# Patient Record
Sex: Female | Born: 1935 | Race: White | Hispanic: Yes | Smoking: Never smoker
Health system: Southern US, Community
[De-identification: ages and names within clinical notes are randomized; demographics above are authoritative.]

## PROBLEM LIST (undated history)

## (undated) DIAGNOSIS — K5730 Diverticulosis of large intestine without perforation or abscess without bleeding: Secondary | ICD-10-CM

## (undated) DIAGNOSIS — N390 Urinary tract infection, site not specified: Secondary | ICD-10-CM

## (undated) DIAGNOSIS — K432 Incisional hernia without obstruction or gangrene: Secondary | ICD-10-CM

## (undated) DIAGNOSIS — R531 Weakness: Secondary | ICD-10-CM

## (undated) DIAGNOSIS — R350 Frequency of micturition: Secondary | ICD-10-CM

## (undated) DIAGNOSIS — IMO0001 Reserved for inherently not codable concepts without codable children: Secondary | ICD-10-CM

## (undated) DIAGNOSIS — I2584 Coronary atherosclerosis due to calcified coronary lesion: Secondary | ICD-10-CM

## (undated) DIAGNOSIS — F03B0 Unspecified dementia, moderate, without behavioral disturbance, psychotic disturbance, mood disturbance, and anxiety: Secondary | ICD-10-CM

## (undated) DIAGNOSIS — N393 Stress incontinence (female) (male): Secondary | ICD-10-CM

## (undated) DIAGNOSIS — K228 Other specified diseases of esophagus: Secondary | ICD-10-CM

## (undated) DIAGNOSIS — H409 Unspecified glaucoma: Secondary | ICD-10-CM

## (undated) DIAGNOSIS — R51 Headache: Secondary | ICD-10-CM

## (undated) DIAGNOSIS — T18128A Food in esophagus causing other injury, initial encounter: Secondary | ICD-10-CM

## (undated) DIAGNOSIS — K5900 Constipation, unspecified: Secondary | ICD-10-CM

## (undated) DIAGNOSIS — I5032 Chronic diastolic (congestive) heart failure: Secondary | ICD-10-CM

## (undated) DIAGNOSIS — Z7382 Dual sensory impairment: Secondary | ICD-10-CM

## (undated) DIAGNOSIS — N3010 Interstitial cystitis (chronic) without hematuria: Secondary | ICD-10-CM

## (undated) DIAGNOSIS — R55 Syncope and collapse: Secondary | ICD-10-CM

## (undated) DIAGNOSIS — M48061 Spinal stenosis, lumbar region without neurogenic claudication: Secondary | ICD-10-CM

## (undated) DIAGNOSIS — E878 Other disorders of electrolyte and fluid balance, not elsewhere classified: Secondary | ICD-10-CM

## (undated) DIAGNOSIS — G629 Polyneuropathy, unspecified: Secondary | ICD-10-CM

## (undated) DIAGNOSIS — W06XXXA Fall from bed, initial encounter: Secondary | ICD-10-CM

## (undated) DIAGNOSIS — K635 Polyp of colon: Secondary | ICD-10-CM

## (undated) DIAGNOSIS — F329 Major depressive disorder, single episode, unspecified: Secondary | ICD-10-CM

## (undated) DIAGNOSIS — M542 Cervicalgia: Secondary | ICD-10-CM

## (undated) DIAGNOSIS — I10 Essential (primary) hypertension: Secondary | ICD-10-CM

## (undated) DIAGNOSIS — G4700 Insomnia, unspecified: Secondary | ICD-10-CM

## (undated) DIAGNOSIS — H547 Unspecified visual loss: Secondary | ICD-10-CM

## (undated) DIAGNOSIS — G5600 Carpal tunnel syndrome, unspecified upper limb: Secondary | ICD-10-CM

## (undated) DIAGNOSIS — N183 Chronic kidney disease, stage 3 (moderate): Secondary | ICD-10-CM

## (undated) DIAGNOSIS — N179 Acute kidney failure, unspecified: Secondary | ICD-10-CM

## (undated) DIAGNOSIS — Z860100 Personal history of colon polyps, unspecified: Secondary | ICD-10-CM

## (undated) DIAGNOSIS — G609 Hereditary and idiopathic neuropathy, unspecified: Secondary | ICD-10-CM

## (undated) DIAGNOSIS — W44F3XA Food entering into or through a natural orifice, initial encounter: Secondary | ICD-10-CM

## (undated) DIAGNOSIS — S92351D Displaced fracture of fifth metatarsal bone, right foot, subsequent encounter for fracture with routine healing: Secondary | ICD-10-CM

## (undated) DIAGNOSIS — M12819 Other specific arthropathies, not elsewhere classified, unspecified shoulder: Secondary | ICD-10-CM

## (undated) DIAGNOSIS — M75101 Unspecified rotator cuff tear or rupture of right shoulder, not specified as traumatic: Secondary | ICD-10-CM

## (undated) DIAGNOSIS — E039 Hypothyroidism, unspecified: Secondary | ICD-10-CM

## (undated) DIAGNOSIS — H353190 Nonexudative age-related macular degeneration, unspecified eye, stage unspecified: Secondary | ICD-10-CM

## (undated) DIAGNOSIS — R634 Abnormal weight loss: Secondary | ICD-10-CM

## (undated) DIAGNOSIS — I442 Atrioventricular block, complete: Secondary | ICD-10-CM

## (undated) DIAGNOSIS — R519 Headache, unspecified: Secondary | ICD-10-CM

## (undated) DIAGNOSIS — I951 Orthostatic hypotension: Secondary | ICD-10-CM

## (undated) DIAGNOSIS — F0280 Dementia in other diseases classified elsewhere without behavioral disturbance: Secondary | ICD-10-CM

## (undated) DIAGNOSIS — G309 Alzheimer's disease, unspecified: Secondary | ICD-10-CM

## (undated) DIAGNOSIS — I700 Atherosclerosis of aorta: Secondary | ICD-10-CM

## (undated) DIAGNOSIS — K222 Esophageal obstruction: Secondary | ICD-10-CM

## (undated) DIAGNOSIS — M546 Pain in thoracic spine: Principal | ICD-10-CM

## (undated) DIAGNOSIS — H9190 Unspecified hearing loss, unspecified ear: Secondary | ICD-10-CM

## (undated) DIAGNOSIS — R0602 Shortness of breath: Secondary | ICD-10-CM

## (undated) DIAGNOSIS — M25552 Pain in left hip: Secondary | ICD-10-CM

## (undated) DIAGNOSIS — E785 Hyperlipidemia, unspecified: Secondary | ICD-10-CM

## (undated) DIAGNOSIS — R222 Localized swelling, mass and lump, trunk: Secondary | ICD-10-CM

## (undated) DIAGNOSIS — G8929 Other chronic pain: Secondary | ICD-10-CM

## (undated) DIAGNOSIS — M810 Age-related osteoporosis without current pathological fracture: Secondary | ICD-10-CM

## (undated) DIAGNOSIS — E871 Hypo-osmolality and hyponatremia: Secondary | ICD-10-CM

## (undated) DIAGNOSIS — I441 Atrioventricular block, second degree: Secondary | ICD-10-CM

## (undated) DIAGNOSIS — H9110 Presbycusis, unspecified ear: Secondary | ICD-10-CM

## (undated) DIAGNOSIS — Z950 Presence of cardiac pacemaker: Secondary | ICD-10-CM

## (undated) DIAGNOSIS — J479 Bronchiectasis, uncomplicated: Secondary | ICD-10-CM

## (undated) DIAGNOSIS — R2681 Unsteadiness on feet: Secondary | ICD-10-CM

## (undated) DIAGNOSIS — G894 Chronic pain syndrome: Secondary | ICD-10-CM

## (undated) DIAGNOSIS — R5383 Other fatigue: Secondary | ICD-10-CM

## (undated) DIAGNOSIS — K859 Acute pancreatitis without necrosis or infection, unspecified: Secondary | ICD-10-CM

## (undated) DIAGNOSIS — M109 Gout, unspecified: Secondary | ICD-10-CM

## (undated) DIAGNOSIS — M1990 Unspecified osteoarthritis, unspecified site: Secondary | ICD-10-CM

## (undated) DIAGNOSIS — M549 Dorsalgia, unspecified: Secondary | ICD-10-CM

## (undated) DIAGNOSIS — N3946 Mixed incontinence: Secondary | ICD-10-CM

## (undated) DIAGNOSIS — J101 Influenza due to other identified influenza virus with other respiratory manifestations: Secondary | ICD-10-CM

## (undated) DIAGNOSIS — F0390 Unspecified dementia without behavioral disturbance: Secondary | ICD-10-CM

## (undated) DIAGNOSIS — Z45018 Encounter for adjustment and management of other part of cardiac pacemaker: Secondary | ICD-10-CM

## (undated) DIAGNOSIS — I639 Cerebral infarction, unspecified: Secondary | ICD-10-CM

## (undated) DIAGNOSIS — H9193 Unspecified hearing loss, bilateral: Secondary | ICD-10-CM

## (undated) DIAGNOSIS — M159 Polyosteoarthritis, unspecified: Secondary | ICD-10-CM

## (undated) DIAGNOSIS — M25561 Pain in right knee: Secondary | ICD-10-CM

## (undated) DIAGNOSIS — R29898 Other symptoms and signs involving the musculoskeletal system: Secondary | ICD-10-CM

## (undated) DIAGNOSIS — R0781 Pleurodynia: Secondary | ICD-10-CM

## (undated) DIAGNOSIS — K5909 Other constipation: Secondary | ICD-10-CM

## (undated) DIAGNOSIS — Z8739 Personal history of other diseases of the musculoskeletal system and connective tissue: Secondary | ICD-10-CM

## (undated) DIAGNOSIS — Z96619 Presence of unspecified artificial shoulder joint: Secondary | ICD-10-CM

## (undated) DIAGNOSIS — I2510 Atherosclerotic heart disease of native coronary artery without angina pectoris: Secondary | ICD-10-CM

## (undated) DIAGNOSIS — R42 Dizziness and giddiness: Secondary | ICD-10-CM

## (undated) DIAGNOSIS — R1310 Dysphagia, unspecified: Secondary | ICD-10-CM

## (undated) DIAGNOSIS — I498 Other specified cardiac arrhythmias: Secondary | ICD-10-CM

## (undated) DIAGNOSIS — I509 Heart failure, unspecified: Secondary | ICD-10-CM

## (undated) DIAGNOSIS — Z7409 Other reduced mobility: Secondary | ICD-10-CM

## (undated) DIAGNOSIS — M353 Polymyalgia rheumatica: Secondary | ICD-10-CM

## (undated) DIAGNOSIS — M75100 Unspecified rotator cuff tear or rupture of unspecified shoulder, not specified as traumatic: Secondary | ICD-10-CM

## (undated) DIAGNOSIS — N819 Female genital prolapse, unspecified: Secondary | ICD-10-CM

## (undated) DIAGNOSIS — H04123 Dry eye syndrome of bilateral lacrimal glands: Secondary | ICD-10-CM

## (undated) HISTORY — DX: Urinary tract infection, site not specified: N39.0

## (undated) HISTORY — PX: EYE SURGERY: SHX253

## (undated) HISTORY — DX: Carpal tunnel syndrome, unspecified upper limb: G56.00

## (undated) HISTORY — PX: INSERT / REPLACE / REMOVE PACEMAKER: SUR710

## (undated) HISTORY — DX: Fall from bed, initial encounter: W06.XXXA

## (undated) HISTORY — DX: Acute kidney failure, unspecified: N17.9

## (undated) HISTORY — DX: Other symptoms and signs involving the musculoskeletal system: R29.898

## (undated) HISTORY — DX: Polyneuropathy, unspecified: G62.9

## (undated) HISTORY — DX: Chronic pain syndrome: G89.4

## (undated) HISTORY — DX: Other fatigue: R53.83

## (undated) HISTORY — DX: Frequency of micturition: R35.0

## (undated) HISTORY — DX: Atherosclerotic heart disease of native coronary artery without angina pectoris: I25.10

## (undated) HISTORY — DX: Spinal stenosis, lumbar region without neurogenic claudication: M48.061

## (undated) HISTORY — PX: CARPAL TUNNEL RELEASE: SHX101

## (undated) HISTORY — DX: Bronchiectasis, uncomplicated: J47.9

## (undated) HISTORY — DX: Diverticulosis of large intestine without perforation or abscess without bleeding: K57.30

## (undated) HISTORY — DX: Chronic kidney disease, stage 3 (moderate): N18.3

## (undated) HISTORY — DX: Pain in right knee: M25.561

## (undated) HISTORY — DX: Cervicalgia: M54.2

## (undated) HISTORY — DX: Headache: R51

## (undated) HISTORY — DX: Displaced fracture of fifth metatarsal bone, right foot, subsequent encounter for fracture with routine healing: S92.351D

## (undated) HISTORY — DX: Atherosclerosis of aorta: I70.0

## (undated) HISTORY — PX: LOOP RECORDER IMPLANT: SHX5954

## (undated) HISTORY — DX: Headache, unspecified: R51.9

## (undated) HISTORY — DX: Atrioventricular block, complete: I44.2

## (undated) HISTORY — DX: Presbycusis, unspecified ear: H91.10

## (undated) HISTORY — DX: Gout, unspecified: M10.9

## (undated) HISTORY — DX: Influenza due to other identified influenza virus with other respiratory manifestations: J10.1

## (undated) HISTORY — DX: Hypo-osmolality and hyponatremia: E87.1

## (undated) HISTORY — DX: Major depressive disorder, single episode, unspecified: F32.9

## (undated) HISTORY — DX: Other specific arthropathies, not elsewhere classified, unspecified shoulder: M12.819

## (undated) HISTORY — DX: Unspecified hearing loss, unspecified ear: H91.90

## (undated) HISTORY — DX: Encounter for adjustment and management of other part of cardiac pacemaker: Z45.018

## (undated) HISTORY — DX: Unspecified rotator cuff tear or rupture of unspecified shoulder, not specified as traumatic: M75.100

## (undated) HISTORY — DX: Syncope and collapse: R55

## (undated) HISTORY — DX: Coronary atherosclerosis due to calcified coronary lesion: I25.84

## (undated) HISTORY — DX: Pleurodynia: R07.81

## (undated) HISTORY — DX: Weakness: R53.1

## (undated) HISTORY — DX: Interstitial cystitis (chronic) without hematuria: N30.10

## (undated) HISTORY — DX: Unspecified glaucoma: H40.9

## (undated) HISTORY — DX: Dementia in other diseases classified elsewhere without behavioral disturbance: F02.80

## (undated) HISTORY — DX: Pain in thoracic spine: M54.6

## (undated) HISTORY — DX: Essential (primary) hypertension: I10

## (undated) HISTORY — DX: Hereditary and idiopathic neuropathy, unspecified: G60.9

## (undated) HISTORY — DX: Other chronic pain: G89.29

## (undated) HISTORY — DX: Unspecified osteoarthritis, unspecified site: M19.90

## (undated) HISTORY — DX: Personal history of other diseases of the musculoskeletal system and connective tissue: Z87.39

## (undated) HISTORY — DX: Dual sensory impairment: Z73.82

## (undated) HISTORY — DX: Polymyalgia rheumatica: M35.3

## (undated) HISTORY — DX: Pain in left hip: M25.552

## (undated) HISTORY — DX: Dizziness and giddiness: R42

## (undated) HISTORY — DX: Nonexudative age-related macular degeneration, unspecified eye, stage unspecified: H35.3190

## (undated) HISTORY — DX: Hyperlipidemia, unspecified: E78.5

## (undated) HISTORY — DX: Dysphagia, unspecified: R13.10

## (undated) HISTORY — DX: Orthostatic hypotension: I95.1

## (undated) HISTORY — DX: Other reduced mobility: Z74.09

## (undated) HISTORY — DX: Polyosteoarthritis, unspecified: M15.9

## (undated) HISTORY — DX: Age-related osteoporosis without current pathological fracture: M81.0

## (undated) HISTORY — DX: Other disorders of electrolyte and fluid balance, not elsewhere classified: E87.8

## (undated) HISTORY — DX: Other specified cardiac arrhythmias: I49.8

## (undated) HISTORY — DX: Esophageal obstruction: K22.2

## (undated) HISTORY — DX: Presence of cardiac pacemaker: Z95.0

## (undated) HISTORY — DX: Dorsalgia, unspecified: M54.9

## (undated) HISTORY — DX: Localized swelling, mass and lump, trunk: R22.2

## (undated) HISTORY — DX: Chronic diastolic (congestive) heart failure: I50.32

## (undated) HISTORY — DX: Polyp of colon: K63.5

## (undated) HISTORY — DX: Female genital prolapse, unspecified: N81.9

## (undated) HISTORY — DX: Food entering into or through a natural orifice, initial encounter: W44.F3XA

## (undated) HISTORY — DX: Mixed incontinence: N39.46

## (undated) HISTORY — DX: Food in esophagus causing other injury, initial encounter: T18.128A

## (undated) HISTORY — DX: Acute pancreatitis without necrosis or infection, unspecified: K85.90

## (undated) HISTORY — DX: Alzheimer's disease, unspecified: G30.9

## (undated) HISTORY — DX: Other specified diseases of esophagus: K22.8

## (undated) HISTORY — DX: Abnormal weight loss: R63.4

## (undated) HISTORY — DX: Presence of unspecified artificial shoulder joint: Z96.619

## (undated) HISTORY — DX: Atrioventricular block, second degree: I44.1

## (undated) HISTORY — DX: Unspecified visual loss: H54.7

## (undated) HISTORY — DX: Shortness of breath: R06.02

## (undated) HISTORY — DX: Insomnia, unspecified: G47.00

## (undated) HISTORY — DX: Incisional hernia without obstruction or gangrene: K43.2

---

## 1997-08-04 ENCOUNTER — Encounter: Admission: RE | Admit: 1997-08-04 | Discharge: 1997-08-04 | Payer: Self-pay | Admitting: Sports Medicine

## 1997-08-28 ENCOUNTER — Encounter: Admission: RE | Admit: 1997-08-28 | Discharge: 1997-08-28 | Payer: Self-pay | Admitting: Family Medicine

## 1997-09-12 ENCOUNTER — Ambulatory Visit: Admission: RE | Admit: 1997-09-12 | Discharge: 1997-09-12 | Payer: Self-pay

## 1997-09-22 ENCOUNTER — Emergency Department (HOSPITAL_COMMUNITY): Admission: EM | Admit: 1997-09-22 | Discharge: 1997-09-22 | Payer: Self-pay | Admitting: Emergency Medicine

## 1997-10-02 ENCOUNTER — Emergency Department (HOSPITAL_COMMUNITY): Admission: EM | Admit: 1997-10-02 | Discharge: 1997-10-02 | Payer: Self-pay | Admitting: Emergency Medicine

## 1997-10-02 ENCOUNTER — Encounter: Admission: RE | Admit: 1997-10-02 | Discharge: 1997-10-02 | Payer: Self-pay | Admitting: Obstetrics

## 1997-10-08 ENCOUNTER — Encounter: Admission: RE | Admit: 1997-10-08 | Discharge: 1997-10-08 | Payer: Self-pay | Admitting: Family Medicine

## 1997-10-30 ENCOUNTER — Encounter: Admission: RE | Admit: 1997-10-30 | Discharge: 1997-10-30 | Payer: Self-pay | Admitting: Obstetrics

## 1997-11-06 ENCOUNTER — Encounter: Admission: RE | Admit: 1997-11-06 | Discharge: 1997-11-06 | Payer: Self-pay | Admitting: Family Medicine

## 1997-12-04 ENCOUNTER — Encounter: Admission: RE | Admit: 1997-12-04 | Discharge: 1997-12-04 | Payer: Self-pay | Admitting: Obstetrics

## 1997-12-23 ENCOUNTER — Encounter: Admission: RE | Admit: 1997-12-23 | Discharge: 1997-12-23 | Payer: Self-pay | Admitting: Sports Medicine

## 1998-01-28 ENCOUNTER — Encounter: Admission: RE | Admit: 1998-01-28 | Discharge: 1998-01-28 | Payer: Self-pay | Admitting: Family Medicine

## 1998-02-12 ENCOUNTER — Encounter: Admission: RE | Admit: 1998-02-12 | Discharge: 1998-02-12 | Payer: Self-pay | Admitting: Obstetrics

## 1998-03-04 ENCOUNTER — Encounter: Admission: RE | Admit: 1998-03-04 | Discharge: 1998-03-04 | Payer: Self-pay | Admitting: Family Medicine

## 1998-03-12 ENCOUNTER — Encounter: Admission: RE | Admit: 1998-03-12 | Discharge: 1998-03-12 | Payer: Self-pay | Admitting: Obstetrics

## 1998-04-08 ENCOUNTER — Encounter: Admission: RE | Admit: 1998-04-08 | Discharge: 1998-04-08 | Payer: Self-pay | Admitting: Family Medicine

## 1998-04-16 ENCOUNTER — Encounter: Admission: RE | Admit: 1998-04-16 | Discharge: 1998-04-16 | Payer: Self-pay | Admitting: Obstetrics

## 1998-06-17 ENCOUNTER — Encounter: Admission: RE | Admit: 1998-06-17 | Discharge: 1998-06-17 | Payer: Self-pay | Admitting: Family Medicine

## 1998-07-23 ENCOUNTER — Encounter: Admission: RE | Admit: 1998-07-23 | Discharge: 1998-07-23 | Payer: Self-pay | Admitting: Obstetrics

## 1998-08-20 ENCOUNTER — Encounter: Admission: RE | Admit: 1998-08-20 | Discharge: 1998-08-20 | Payer: Self-pay | Admitting: Obstetrics

## 1998-09-10 ENCOUNTER — Encounter: Admission: RE | Admit: 1998-09-10 | Discharge: 1998-09-10 | Payer: Self-pay | Admitting: Family Medicine

## 1998-09-15 ENCOUNTER — Emergency Department (HOSPITAL_COMMUNITY): Admission: EM | Admit: 1998-09-15 | Discharge: 1998-09-15 | Payer: Self-pay | Admitting: Emergency Medicine

## 1998-09-15 ENCOUNTER — Encounter: Payer: Self-pay | Admitting: Emergency Medicine

## 1998-10-01 ENCOUNTER — Encounter: Admission: RE | Admit: 1998-10-01 | Discharge: 1998-10-01 | Payer: Self-pay | Admitting: Obstetrics

## 1998-10-29 ENCOUNTER — Other Ambulatory Visit: Admission: RE | Admit: 1998-10-29 | Discharge: 1998-10-29 | Payer: Self-pay | Admitting: Obstetrics

## 1998-10-29 ENCOUNTER — Encounter: Admission: RE | Admit: 1998-10-29 | Discharge: 1998-10-29 | Payer: Self-pay | Admitting: Obstetrics

## 1998-11-11 ENCOUNTER — Encounter: Admission: RE | Admit: 1998-11-11 | Discharge: 1998-11-11 | Payer: Self-pay | Admitting: Family Medicine

## 1998-11-17 ENCOUNTER — Encounter: Admission: RE | Admit: 1998-11-17 | Discharge: 1998-11-17 | Payer: Self-pay | Admitting: Sports Medicine

## 1998-11-26 ENCOUNTER — Encounter: Admission: RE | Admit: 1998-11-26 | Discharge: 1998-11-26 | Payer: Self-pay | Admitting: Obstetrics

## 1998-11-27 ENCOUNTER — Encounter: Admission: RE | Admit: 1998-11-27 | Discharge: 1998-11-27 | Payer: Self-pay | Admitting: Family Medicine

## 1998-12-03 ENCOUNTER — Encounter: Admission: RE | Admit: 1998-12-03 | Discharge: 1998-12-03 | Payer: Self-pay | Admitting: *Deleted

## 1998-12-03 ENCOUNTER — Encounter: Admission: RE | Admit: 1998-12-03 | Discharge: 1998-12-03 | Payer: Self-pay | Admitting: Obstetrics

## 1998-12-31 ENCOUNTER — Encounter: Admission: RE | Admit: 1998-12-31 | Discharge: 1998-12-31 | Payer: Self-pay | Admitting: Obstetrics

## 1999-02-15 ENCOUNTER — Encounter: Admission: RE | Admit: 1999-02-15 | Discharge: 1999-02-15 | Payer: Self-pay | Admitting: Family Medicine

## 1999-02-25 ENCOUNTER — Encounter: Admission: RE | Admit: 1999-02-25 | Discharge: 1999-02-25 | Payer: Self-pay | Admitting: *Deleted

## 1999-03-10 ENCOUNTER — Encounter: Admission: RE | Admit: 1999-03-10 | Discharge: 1999-03-10 | Payer: Self-pay | Admitting: *Deleted

## 1999-03-17 ENCOUNTER — Encounter: Admission: RE | Admit: 1999-03-17 | Discharge: 1999-03-17 | Payer: Self-pay | Admitting: Family Medicine

## 1999-04-06 ENCOUNTER — Other Ambulatory Visit: Admission: RE | Admit: 1999-04-06 | Discharge: 1999-04-06 | Payer: Self-pay | Admitting: Urology

## 1999-05-05 ENCOUNTER — Ambulatory Visit (HOSPITAL_COMMUNITY): Admission: RE | Admit: 1999-05-05 | Discharge: 1999-05-05 | Payer: Self-pay | Admitting: Family Medicine

## 1999-05-05 ENCOUNTER — Encounter: Payer: Self-pay | Admitting: Sports Medicine

## 1999-05-05 ENCOUNTER — Encounter: Admission: RE | Admit: 1999-05-05 | Discharge: 1999-05-05 | Payer: Self-pay | Admitting: Family Medicine

## 1999-05-05 ENCOUNTER — Encounter: Admission: RE | Admit: 1999-05-05 | Discharge: 1999-05-05 | Payer: Self-pay | Admitting: Sports Medicine

## 1999-05-20 ENCOUNTER — Encounter: Admission: RE | Admit: 1999-05-20 | Discharge: 1999-05-20 | Payer: Self-pay | Admitting: Obstetrics

## 1999-08-31 ENCOUNTER — Encounter: Admission: RE | Admit: 1999-08-31 | Discharge: 1999-08-31 | Payer: Self-pay | Admitting: Family Medicine

## 1999-12-16 ENCOUNTER — Encounter: Admission: RE | Admit: 1999-12-16 | Discharge: 1999-12-16 | Payer: Self-pay | Admitting: Obstetrics

## 2000-02-03 ENCOUNTER — Encounter: Admission: RE | Admit: 2000-02-03 | Discharge: 2000-02-03 | Payer: Self-pay | Admitting: Family Medicine

## 2000-02-21 ENCOUNTER — Encounter: Admission: RE | Admit: 2000-02-21 | Discharge: 2000-02-21 | Payer: Self-pay | Admitting: Family Medicine

## 2000-03-27 ENCOUNTER — Encounter: Admission: RE | Admit: 2000-03-27 | Discharge: 2000-03-27 | Payer: Self-pay | Admitting: Family Medicine

## 2000-04-22 ENCOUNTER — Encounter: Payer: Self-pay | Admitting: Emergency Medicine

## 2000-04-22 ENCOUNTER — Emergency Department (HOSPITAL_COMMUNITY): Admission: EM | Admit: 2000-04-22 | Discharge: 2000-04-22 | Payer: Self-pay | Admitting: Emergency Medicine

## 2000-06-02 ENCOUNTER — Encounter (INDEPENDENT_AMBULATORY_CARE_PROVIDER_SITE_OTHER): Payer: Self-pay | Admitting: *Deleted

## 2000-06-06 ENCOUNTER — Encounter: Admission: RE | Admit: 2000-06-06 | Discharge: 2000-06-06 | Payer: Self-pay | Admitting: Sports Medicine

## 2000-06-07 ENCOUNTER — Encounter: Admission: RE | Admit: 2000-06-07 | Discharge: 2000-06-07 | Payer: Self-pay | Admitting: *Deleted

## 2000-09-01 ENCOUNTER — Encounter: Admission: RE | Admit: 2000-09-01 | Discharge: 2000-09-01 | Payer: Self-pay | Admitting: Family Medicine

## 2000-09-05 ENCOUNTER — Encounter: Admission: RE | Admit: 2000-09-05 | Discharge: 2000-09-05 | Payer: Self-pay | Admitting: Sports Medicine

## 2000-09-05 ENCOUNTER — Encounter: Payer: Self-pay | Admitting: Sports Medicine

## 2000-09-07 ENCOUNTER — Encounter: Admission: RE | Admit: 2000-09-07 | Discharge: 2000-09-07 | Payer: Self-pay | Admitting: Family Medicine

## 2000-09-11 ENCOUNTER — Encounter: Payer: Self-pay | Admitting: Sports Medicine

## 2000-09-11 ENCOUNTER — Encounter: Admission: RE | Admit: 2000-09-11 | Discharge: 2000-09-11 | Payer: Self-pay | Admitting: Sports Medicine

## 2000-09-14 ENCOUNTER — Encounter: Admission: RE | Admit: 2000-09-14 | Discharge: 2000-09-14 | Payer: Self-pay | Admitting: Family Medicine

## 2000-09-28 ENCOUNTER — Encounter: Admission: RE | Admit: 2000-09-28 | Discharge: 2000-09-28 | Payer: Self-pay | Admitting: Family Medicine

## 2000-11-30 ENCOUNTER — Encounter: Admission: RE | Admit: 2000-11-30 | Discharge: 2000-11-30 | Payer: Self-pay | Admitting: Family Medicine

## 2001-01-30 ENCOUNTER — Encounter: Admission: RE | Admit: 2001-01-30 | Discharge: 2001-01-30 | Payer: Self-pay | Admitting: Family Medicine

## 2001-03-15 ENCOUNTER — Encounter: Admission: RE | Admit: 2001-03-15 | Discharge: 2001-03-15 | Payer: Self-pay | Admitting: Obstetrics

## 2001-06-08 ENCOUNTER — Encounter: Admission: RE | Admit: 2001-06-08 | Discharge: 2001-06-08 | Payer: Self-pay | Admitting: Sports Medicine

## 2001-06-08 ENCOUNTER — Encounter: Payer: Self-pay | Admitting: Sports Medicine

## 2001-06-08 ENCOUNTER — Encounter: Admission: RE | Admit: 2001-06-08 | Discharge: 2001-06-08 | Payer: Self-pay | Admitting: Family Medicine

## 2001-06-22 ENCOUNTER — Encounter: Payer: Self-pay | Admitting: Sports Medicine

## 2001-06-22 ENCOUNTER — Encounter: Admission: RE | Admit: 2001-06-22 | Discharge: 2001-06-22 | Payer: Self-pay | Admitting: Sports Medicine

## 2001-07-25 ENCOUNTER — Encounter: Admission: RE | Admit: 2001-07-25 | Discharge: 2001-07-25 | Payer: Self-pay | Admitting: Family Medicine

## 2001-09-26 ENCOUNTER — Encounter: Admission: RE | Admit: 2001-09-26 | Discharge: 2001-09-26 | Payer: Self-pay | Admitting: Family Medicine

## 2001-12-19 ENCOUNTER — Encounter: Admission: RE | Admit: 2001-12-19 | Discharge: 2001-12-19 | Payer: Self-pay | Admitting: Family Medicine

## 2001-12-19 ENCOUNTER — Encounter: Payer: Self-pay | Admitting: Family Medicine

## 2002-02-27 ENCOUNTER — Encounter: Admission: RE | Admit: 2002-02-27 | Discharge: 2002-02-27 | Payer: Self-pay | Admitting: Family Medicine

## 2002-06-24 ENCOUNTER — Encounter: Admission: RE | Admit: 2002-06-24 | Discharge: 2002-06-24 | Payer: Self-pay | Admitting: Sports Medicine

## 2002-06-24 ENCOUNTER — Encounter: Payer: Self-pay | Admitting: Sports Medicine

## 2002-07-05 ENCOUNTER — Encounter (INDEPENDENT_AMBULATORY_CARE_PROVIDER_SITE_OTHER): Payer: Self-pay | Admitting: Specialist

## 2002-07-05 ENCOUNTER — Encounter: Admission: RE | Admit: 2002-07-05 | Discharge: 2002-07-05 | Payer: Self-pay | Admitting: Sports Medicine

## 2002-07-05 ENCOUNTER — Encounter: Payer: Self-pay | Admitting: Sports Medicine

## 2002-08-21 ENCOUNTER — Encounter: Admission: RE | Admit: 2002-08-21 | Discharge: 2002-08-21 | Payer: Self-pay | Admitting: Family Medicine

## 2002-10-16 ENCOUNTER — Encounter: Payer: Self-pay | Admitting: Sports Medicine

## 2002-10-16 ENCOUNTER — Encounter: Admission: RE | Admit: 2002-10-16 | Discharge: 2002-10-16 | Payer: Self-pay | Admitting: Sports Medicine

## 2002-10-16 ENCOUNTER — Encounter: Admission: RE | Admit: 2002-10-16 | Discharge: 2002-10-16 | Payer: Self-pay | Admitting: Family Medicine

## 2002-10-23 ENCOUNTER — Encounter: Admission: RE | Admit: 2002-10-23 | Discharge: 2002-10-23 | Payer: Self-pay | Admitting: Family Medicine

## 2002-10-31 ENCOUNTER — Encounter: Admission: RE | Admit: 2002-10-31 | Discharge: 2002-10-31 | Payer: Self-pay | Admitting: Family Medicine

## 2002-10-31 ENCOUNTER — Encounter: Payer: Self-pay | Admitting: Sports Medicine

## 2002-11-05 ENCOUNTER — Encounter: Admission: RE | Admit: 2002-11-05 | Discharge: 2002-11-05 | Payer: Self-pay | Admitting: Sports Medicine

## 2003-01-01 ENCOUNTER — Encounter: Admission: RE | Admit: 2003-01-01 | Discharge: 2003-01-01 | Payer: Self-pay | Admitting: Family Medicine

## 2003-03-12 ENCOUNTER — Encounter: Admission: RE | Admit: 2003-03-12 | Discharge: 2003-03-12 | Payer: Self-pay | Admitting: Family Medicine

## 2003-04-23 ENCOUNTER — Encounter: Admission: RE | Admit: 2003-04-23 | Discharge: 2003-04-23 | Payer: Self-pay | Admitting: Family Medicine

## 2003-07-24 ENCOUNTER — Encounter: Admission: RE | Admit: 2003-07-24 | Discharge: 2003-07-24 | Payer: Self-pay | Admitting: Family Medicine

## 2003-08-06 ENCOUNTER — Encounter: Admission: RE | Admit: 2003-08-06 | Discharge: 2003-08-06 | Payer: Self-pay | Admitting: Family Medicine

## 2004-04-14 ENCOUNTER — Ambulatory Visit: Payer: Self-pay | Admitting: Family Medicine

## 2004-05-02 DIAGNOSIS — K635 Polyp of colon: Secondary | ICD-10-CM

## 2004-05-02 HISTORY — DX: Polyp of colon: K63.5

## 2004-05-19 ENCOUNTER — Ambulatory Visit: Payer: Self-pay | Admitting: Family Medicine

## 2004-05-24 ENCOUNTER — Ambulatory Visit: Payer: Self-pay | Admitting: Sports Medicine

## 2004-07-07 ENCOUNTER — Ambulatory Visit: Payer: Self-pay | Admitting: Family Medicine

## 2004-07-27 ENCOUNTER — Encounter: Admission: RE | Admit: 2004-07-27 | Discharge: 2004-07-27 | Payer: Self-pay | Admitting: Sports Medicine

## 2004-08-18 ENCOUNTER — Ambulatory Visit: Payer: Self-pay | Admitting: Family Medicine

## 2004-08-25 ENCOUNTER — Ambulatory Visit: Payer: Self-pay | Admitting: Family Medicine

## 2004-09-08 ENCOUNTER — Ambulatory Visit: Payer: Self-pay | Admitting: Family Medicine

## 2004-09-27 ENCOUNTER — Ambulatory Visit: Payer: Self-pay | Admitting: Gastroenterology

## 2004-09-27 ENCOUNTER — Ambulatory Visit: Payer: Self-pay | Admitting: Family Medicine

## 2004-09-27 ENCOUNTER — Inpatient Hospital Stay (HOSPITAL_COMMUNITY): Admission: EM | Admit: 2004-09-27 | Discharge: 2004-09-30 | Payer: Self-pay | Admitting: Emergency Medicine

## 2004-09-28 ENCOUNTER — Encounter (INDEPENDENT_AMBULATORY_CARE_PROVIDER_SITE_OTHER): Payer: Self-pay | Admitting: *Deleted

## 2004-09-29 ENCOUNTER — Encounter (INDEPENDENT_AMBULATORY_CARE_PROVIDER_SITE_OTHER): Payer: Self-pay | Admitting: *Deleted

## 2004-10-20 ENCOUNTER — Ambulatory Visit: Payer: Self-pay | Admitting: Family Medicine

## 2005-01-12 ENCOUNTER — Encounter: Admission: RE | Admit: 2005-01-12 | Discharge: 2005-01-12 | Payer: Self-pay | Admitting: Family Medicine

## 2005-01-12 ENCOUNTER — Ambulatory Visit: Payer: Self-pay | Admitting: Family Medicine

## 2005-01-25 ENCOUNTER — Ambulatory Visit: Admission: RE | Admit: 2005-01-25 | Discharge: 2005-01-25 | Payer: Self-pay | Admitting: Gynecology

## 2005-02-16 ENCOUNTER — Emergency Department (HOSPITAL_COMMUNITY): Admission: EM | Admit: 2005-02-16 | Discharge: 2005-02-16 | Payer: Self-pay | Admitting: Emergency Medicine

## 2005-02-18 ENCOUNTER — Ambulatory Visit: Payer: Self-pay | Admitting: Sports Medicine

## 2005-02-18 ENCOUNTER — Encounter: Admission: RE | Admit: 2005-02-18 | Discharge: 2005-02-18 | Payer: Self-pay | Admitting: Family Medicine

## 2005-02-23 ENCOUNTER — Ambulatory Visit: Payer: Self-pay | Admitting: Family Medicine

## 2005-03-09 ENCOUNTER — Ambulatory Visit: Payer: Self-pay | Admitting: Family Medicine

## 2005-03-15 ENCOUNTER — Inpatient Hospital Stay (HOSPITAL_COMMUNITY): Admission: RE | Admit: 2005-03-15 | Discharge: 2005-03-18 | Payer: Self-pay | Admitting: Obstetrics

## 2005-03-15 ENCOUNTER — Encounter (INDEPENDENT_AMBULATORY_CARE_PROVIDER_SITE_OTHER): Payer: Self-pay | Admitting: *Deleted

## 2005-03-15 HISTORY — PX: TOTAL ABDOMINAL HYSTERECTOMY W/ BILATERAL SALPINGOOPHORECTOMY: SHX83

## 2005-04-13 ENCOUNTER — Ambulatory Visit: Payer: Self-pay | Admitting: Family Medicine

## 2005-04-19 ENCOUNTER — Ambulatory Visit: Admission: RE | Admit: 2005-04-19 | Discharge: 2005-04-19 | Payer: Self-pay | Admitting: Gynecology

## 2005-05-04 ENCOUNTER — Ambulatory Visit: Payer: Self-pay | Admitting: Family Medicine

## 2005-08-01 ENCOUNTER — Encounter: Admission: RE | Admit: 2005-08-01 | Discharge: 2005-08-01 | Payer: Self-pay | Admitting: Sports Medicine

## 2005-09-14 ENCOUNTER — Ambulatory Visit: Payer: Self-pay | Admitting: Family Medicine

## 2006-01-25 ENCOUNTER — Ambulatory Visit: Payer: Self-pay | Admitting: Family Medicine

## 2006-05-31 ENCOUNTER — Ambulatory Visit: Payer: Self-pay | Admitting: Family Medicine

## 2006-06-14 ENCOUNTER — Ambulatory Visit: Payer: Self-pay | Admitting: Family Medicine

## 2006-06-21 ENCOUNTER — Encounter: Admission: RE | Admit: 2006-06-21 | Discharge: 2006-06-21 | Payer: Self-pay | Admitting: Sports Medicine

## 2006-06-21 ENCOUNTER — Ambulatory Visit: Payer: Self-pay | Admitting: Family Medicine

## 2006-06-29 DIAGNOSIS — G5600 Carpal tunnel syndrome, unspecified upper limb: Secondary | ICD-10-CM

## 2006-06-29 DIAGNOSIS — G4700 Insomnia, unspecified: Secondary | ICD-10-CM | POA: Insufficient documentation

## 2006-06-29 DIAGNOSIS — N819 Female genital prolapse, unspecified: Secondary | ICD-10-CM

## 2006-06-29 DIAGNOSIS — F329 Major depressive disorder, single episode, unspecified: Secondary | ICD-10-CM

## 2006-06-29 DIAGNOSIS — N3946 Mixed incontinence: Secondary | ICD-10-CM

## 2006-06-29 DIAGNOSIS — M1612 Unilateral primary osteoarthritis, left hip: Secondary | ICD-10-CM

## 2006-06-29 DIAGNOSIS — I10 Essential (primary) hypertension: Secondary | ICD-10-CM

## 2006-06-29 DIAGNOSIS — K5730 Diverticulosis of large intestine without perforation or abscess without bleeding: Secondary | ICD-10-CM

## 2006-06-29 DIAGNOSIS — F32A Depression, unspecified: Secondary | ICD-10-CM

## 2006-06-29 DIAGNOSIS — N3941 Urge incontinence: Secondary | ICD-10-CM

## 2006-06-29 DIAGNOSIS — M1990 Unspecified osteoarthritis, unspecified site: Secondary | ICD-10-CM

## 2006-06-29 DIAGNOSIS — E039 Hypothyroidism, unspecified: Secondary | ICD-10-CM | POA: Insufficient documentation

## 2006-06-29 HISTORY — DX: Major depressive disorder, single episode, unspecified: F32.9

## 2006-06-29 HISTORY — DX: Female genital prolapse, unspecified: N81.9

## 2006-06-29 HISTORY — DX: Unspecified osteoarthritis, unspecified site: M19.90

## 2006-06-29 HISTORY — DX: Depression, unspecified: F32.A

## 2006-06-29 HISTORY — DX: Diverticulosis of large intestine without perforation or abscess without bleeding: K57.30

## 2006-06-29 HISTORY — DX: Essential (primary) hypertension: I10

## 2006-06-29 HISTORY — DX: Mixed incontinence: N39.46

## 2006-06-29 HISTORY — DX: Carpal tunnel syndrome, unspecified upper limb: G56.00

## 2006-06-30 ENCOUNTER — Encounter (INDEPENDENT_AMBULATORY_CARE_PROVIDER_SITE_OTHER): Payer: Self-pay | Admitting: *Deleted

## 2006-07-07 ENCOUNTER — Telehealth: Payer: Self-pay | Admitting: Family Medicine

## 2006-07-12 ENCOUNTER — Telehealth: Payer: Self-pay | Admitting: Family Medicine

## 2006-08-01 ENCOUNTER — Encounter: Admission: RE | Admit: 2006-08-01 | Discharge: 2006-08-01 | Payer: Self-pay | Admitting: Sports Medicine

## 2006-08-17 ENCOUNTER — Telehealth: Payer: Self-pay | Admitting: *Deleted

## 2006-09-08 ENCOUNTER — Telehealth: Payer: Self-pay | Admitting: Family Medicine

## 2006-09-18 ENCOUNTER — Telehealth: Payer: Self-pay | Admitting: Family Medicine

## 2006-09-26 ENCOUNTER — Telehealth: Payer: Self-pay | Admitting: *Deleted

## 2006-10-10 ENCOUNTER — Telehealth: Payer: Self-pay | Admitting: Family Medicine

## 2006-10-18 ENCOUNTER — Telehealth: Payer: Self-pay | Admitting: *Deleted

## 2006-10-25 ENCOUNTER — Ambulatory Visit: Payer: Self-pay | Admitting: Family Medicine

## 2006-10-25 ENCOUNTER — Encounter: Payer: Self-pay | Admitting: Family Medicine

## 2006-10-25 DIAGNOSIS — K432 Incisional hernia without obstruction or gangrene: Secondary | ICD-10-CM

## 2006-10-25 HISTORY — DX: Incisional hernia without obstruction or gangrene: K43.2

## 2006-10-25 LAB — CONVERTED CEMR LAB
Calcium: 9.4 mg/dL (ref 8.4–10.5)
Glucose, Bld: 95 mg/dL (ref 70–99)
Sodium: 141 meq/L (ref 135–145)
TSH: 1.101 microintl units/mL (ref 0.350–5.50)

## 2006-10-30 ENCOUNTER — Telehealth (INDEPENDENT_AMBULATORY_CARE_PROVIDER_SITE_OTHER): Payer: Self-pay | Admitting: *Deleted

## 2006-11-02 ENCOUNTER — Encounter: Admission: RE | Admit: 2006-11-02 | Discharge: 2006-11-02 | Payer: Self-pay | Admitting: Family Medicine

## 2006-11-10 ENCOUNTER — Telehealth (INDEPENDENT_AMBULATORY_CARE_PROVIDER_SITE_OTHER): Payer: Self-pay | Admitting: Family Medicine

## 2006-11-16 ENCOUNTER — Telehealth (INDEPENDENT_AMBULATORY_CARE_PROVIDER_SITE_OTHER): Payer: Self-pay | Admitting: Family Medicine

## 2006-12-27 ENCOUNTER — Ambulatory Visit: Payer: Self-pay | Admitting: Family Medicine

## 2006-12-27 ENCOUNTER — Telehealth (INDEPENDENT_AMBULATORY_CARE_PROVIDER_SITE_OTHER): Payer: Self-pay | Admitting: *Deleted

## 2006-12-27 DIAGNOSIS — K8020 Calculus of gallbladder without cholecystitis without obstruction: Secondary | ICD-10-CM | POA: Insufficient documentation

## 2007-01-16 ENCOUNTER — Ambulatory Visit: Payer: Self-pay | Admitting: Family Medicine

## 2007-01-16 ENCOUNTER — Telehealth (INDEPENDENT_AMBULATORY_CARE_PROVIDER_SITE_OTHER): Payer: Self-pay | Admitting: *Deleted

## 2007-02-08 ENCOUNTER — Telehealth: Payer: Self-pay | Admitting: *Deleted

## 2007-03-05 ENCOUNTER — Ambulatory Visit: Payer: Self-pay | Admitting: Family Medicine

## 2007-03-12 ENCOUNTER — Telehealth: Payer: Self-pay | Admitting: *Deleted

## 2007-04-30 ENCOUNTER — Telehealth: Payer: Self-pay | Admitting: *Deleted

## 2007-05-08 ENCOUNTER — Telehealth (INDEPENDENT_AMBULATORY_CARE_PROVIDER_SITE_OTHER): Payer: Self-pay | Admitting: Family Medicine

## 2007-07-04 ENCOUNTER — Telehealth: Payer: Self-pay | Admitting: *Deleted

## 2007-07-04 ENCOUNTER — Ambulatory Visit: Payer: Self-pay | Admitting: Family Medicine

## 2007-07-04 LAB — CONVERTED CEMR LAB: Hemoglobin: 13.1 g/dL

## 2007-07-09 ENCOUNTER — Telehealth: Payer: Self-pay | Admitting: *Deleted

## 2007-07-23 ENCOUNTER — Encounter: Admission: RE | Admit: 2007-07-23 | Discharge: 2007-07-23 | Payer: Self-pay | Admitting: Family Medicine

## 2007-08-27 ENCOUNTER — Encounter: Admission: RE | Admit: 2007-08-27 | Discharge: 2007-09-27 | Payer: Self-pay | Admitting: Obstetrics

## 2007-09-10 ENCOUNTER — Encounter (INDEPENDENT_AMBULATORY_CARE_PROVIDER_SITE_OTHER): Payer: Self-pay | Admitting: Family Medicine

## 2007-09-17 DIAGNOSIS — R339 Retention of urine, unspecified: Secondary | ICD-10-CM

## 2007-09-19 ENCOUNTER — Telehealth: Payer: Self-pay | Admitting: *Deleted

## 2007-09-21 ENCOUNTER — Encounter (INDEPENDENT_AMBULATORY_CARE_PROVIDER_SITE_OTHER): Payer: Self-pay | Admitting: Family Medicine

## 2007-09-21 ENCOUNTER — Ambulatory Visit: Payer: Self-pay | Admitting: Family Medicine

## 2007-09-26 ENCOUNTER — Telehealth: Payer: Self-pay | Admitting: *Deleted

## 2007-09-27 LAB — CONVERTED CEMR LAB
ALT: 11 units/L (ref 0–35)
AST: 15 units/L (ref 0–37)
Albumin: 3.7 g/dL (ref 3.5–5.2)
Basophils Absolute: 0 10*3/uL (ref 0.0–0.1)
Basophils Relative: 0 % (ref 0–1)
Calcium: 9.5 mg/dL (ref 8.4–10.5)
Chloride: 103 meq/L (ref 96–112)
MCHC: 31.8 g/dL (ref 30.0–36.0)
Neutro Abs: 5.1 10*3/uL (ref 1.7–7.7)
Neutrophils Relative %: 60 % (ref 43–77)
Potassium: 4.2 meq/L (ref 3.5–5.3)
RBC: 4.35 M/uL (ref 3.87–5.11)
RDW: 13.2 % (ref 11.5–15.5)
TSH: 4.323 microintl units/mL (ref 0.350–5.50)
Total Protein: 7.3 g/dL (ref 6.0–8.3)
Vit D, 1,25-Dihydroxy: 32 (ref 30–89)

## 2007-10-02 ENCOUNTER — Ambulatory Visit: Payer: Self-pay | Admitting: Family Medicine

## 2007-10-03 ENCOUNTER — Telehealth: Payer: Self-pay | Admitting: *Deleted

## 2007-10-08 ENCOUNTER — Encounter: Admission: RE | Admit: 2007-10-08 | Discharge: 2007-10-08 | Payer: Self-pay | Admitting: Family Medicine

## 2007-10-08 ENCOUNTER — Encounter (INDEPENDENT_AMBULATORY_CARE_PROVIDER_SITE_OTHER): Payer: Self-pay | Admitting: Family Medicine

## 2007-10-22 ENCOUNTER — Telehealth (INDEPENDENT_AMBULATORY_CARE_PROVIDER_SITE_OTHER): Payer: Self-pay | Admitting: Family Medicine

## 2007-10-29 ENCOUNTER — Encounter (INDEPENDENT_AMBULATORY_CARE_PROVIDER_SITE_OTHER): Payer: Self-pay | Admitting: Family Medicine

## 2007-12-17 ENCOUNTER — Encounter (INDEPENDENT_AMBULATORY_CARE_PROVIDER_SITE_OTHER): Payer: Self-pay | Admitting: Family Medicine

## 2007-12-17 ENCOUNTER — Ambulatory Visit: Payer: Self-pay | Admitting: Family Medicine

## 2007-12-17 LAB — CONVERTED CEMR LAB
Bilirubin Urine: NEGATIVE
Ketones, urine, test strip: NEGATIVE
Nitrite: NEGATIVE
Specific Gravity, Urine: 1.015

## 2007-12-18 ENCOUNTER — Encounter (INDEPENDENT_AMBULATORY_CARE_PROVIDER_SITE_OTHER): Payer: Self-pay | Admitting: Family Medicine

## 2007-12-18 ENCOUNTER — Encounter: Admission: RE | Admit: 2007-12-18 | Discharge: 2007-12-18 | Payer: Self-pay | Admitting: Family Medicine

## 2007-12-18 LAB — CONVERTED CEMR LAB: Folate: >20 ng/mL

## 2007-12-19 ENCOUNTER — Telehealth (INDEPENDENT_AMBULATORY_CARE_PROVIDER_SITE_OTHER): Payer: Self-pay | Admitting: Family Medicine

## 2007-12-21 ENCOUNTER — Telehealth (INDEPENDENT_AMBULATORY_CARE_PROVIDER_SITE_OTHER): Payer: Self-pay | Admitting: Family Medicine

## 2008-01-28 ENCOUNTER — Encounter (INDEPENDENT_AMBULATORY_CARE_PROVIDER_SITE_OTHER): Payer: Self-pay | Admitting: Family Medicine

## 2008-02-11 ENCOUNTER — Telehealth: Payer: Self-pay | Admitting: *Deleted

## 2008-03-10 ENCOUNTER — Encounter (INDEPENDENT_AMBULATORY_CARE_PROVIDER_SITE_OTHER): Payer: Self-pay | Admitting: Family Medicine

## 2008-03-10 ENCOUNTER — Encounter: Payer: Self-pay | Admitting: *Deleted

## 2008-03-14 ENCOUNTER — Telehealth (INDEPENDENT_AMBULATORY_CARE_PROVIDER_SITE_OTHER): Payer: Self-pay | Admitting: *Deleted

## 2008-03-20 ENCOUNTER — Encounter (INDEPENDENT_AMBULATORY_CARE_PROVIDER_SITE_OTHER): Payer: Self-pay | Admitting: Family Medicine

## 2008-03-20 ENCOUNTER — Ambulatory Visit: Payer: Self-pay | Admitting: Family Medicine

## 2008-03-20 DIAGNOSIS — M810 Age-related osteoporosis without current pathological fracture: Secondary | ICD-10-CM | POA: Insufficient documentation

## 2008-03-20 HISTORY — DX: Age-related osteoporosis without current pathological fracture: M81.0

## 2008-03-20 LAB — CONVERTED CEMR LAB
Albumin: 4 g/dL (ref 3.5–5.2)
CO2: 27 meq/L (ref 19–32)
Calcium: 9.2 mg/dL (ref 8.4–10.5)
Chloride: 102 meq/L (ref 96–112)
Eosinophils Relative: 1 % (ref 0–5)
Glucose, Bld: 97 mg/dL (ref 70–99)
HCT: 44.2 % (ref 36.0–46.0)
Lymphocytes Relative: 24 % (ref 12–46)
Lymphs Abs: 2.5 10*3/uL (ref 0.7–4.0)
Neutro Abs: 7.4 10*3/uL (ref 1.7–7.7)
Platelets: 219 10*3/uL (ref 150–400)
Potassium: 4.3 meq/L (ref 3.5–5.3)
Sodium: 142 meq/L (ref 135–145)
Total Bilirubin: 0.3 mg/dL (ref 0.3–1.2)
Total Protein: 7.3 g/dL (ref 6.0–8.3)
Vit D, 1,25-Dihydroxy: 41 (ref 30–89)
WBC: 10.6 10*3/uL — ABNORMAL HIGH (ref 4.0–10.5)

## 2008-03-21 ENCOUNTER — Encounter (INDEPENDENT_AMBULATORY_CARE_PROVIDER_SITE_OTHER): Payer: Self-pay | Admitting: Family Medicine

## 2008-03-21 LAB — CONVERTED CEMR LAB: TSH: 1.229 microintl units/mL (ref 0.350–4.50)

## 2008-03-24 ENCOUNTER — Encounter: Payer: Self-pay | Admitting: *Deleted

## 2008-03-31 ENCOUNTER — Telehealth: Payer: Self-pay | Admitting: Pharmacist

## 2008-03-31 ENCOUNTER — Encounter: Admission: RE | Admit: 2008-03-31 | Discharge: 2008-03-31 | Payer: Self-pay

## 2008-04-07 ENCOUNTER — Ambulatory Visit: Payer: Self-pay | Admitting: Family Medicine

## 2008-04-14 ENCOUNTER — Encounter (INDEPENDENT_AMBULATORY_CARE_PROVIDER_SITE_OTHER): Payer: Self-pay | Admitting: Family Medicine

## 2008-05-12 ENCOUNTER — Ambulatory Visit: Payer: Self-pay | Admitting: Family Medicine

## 2008-05-19 ENCOUNTER — Encounter: Payer: Self-pay | Admitting: Family Medicine

## 2008-05-19 ENCOUNTER — Ambulatory Visit: Payer: Self-pay | Admitting: Family Medicine

## 2008-05-27 ENCOUNTER — Ambulatory Visit (HOSPITAL_COMMUNITY): Admission: RE | Admit: 2008-05-27 | Discharge: 2008-05-27 | Payer: Self-pay | Admitting: Family Medicine

## 2008-08-11 ENCOUNTER — Encounter: Admission: RE | Admit: 2008-08-11 | Discharge: 2008-08-11 | Payer: Self-pay | Admitting: Family Medicine

## 2008-09-15 ENCOUNTER — Observation Stay (HOSPITAL_COMMUNITY): Admission: EM | Admit: 2008-09-15 | Discharge: 2008-09-16 | Payer: Self-pay | Admitting: Emergency Medicine

## 2008-09-15 ENCOUNTER — Ambulatory Visit: Payer: Self-pay | Admitting: Family Medicine

## 2008-09-15 ENCOUNTER — Encounter: Payer: Self-pay | Admitting: Family Medicine

## 2008-09-15 ENCOUNTER — Encounter (INDEPENDENT_AMBULATORY_CARE_PROVIDER_SITE_OTHER): Payer: Self-pay | Admitting: Family Medicine

## 2008-09-22 ENCOUNTER — Ambulatory Visit: Payer: Self-pay | Admitting: Family Medicine

## 2008-09-25 ENCOUNTER — Telehealth (INDEPENDENT_AMBULATORY_CARE_PROVIDER_SITE_OTHER): Payer: Self-pay | Admitting: Family Medicine

## 2008-09-26 ENCOUNTER — Ambulatory Visit (HOSPITAL_COMMUNITY): Admission: RE | Admit: 2008-09-26 | Discharge: 2008-09-26 | Payer: Self-pay | Admitting: Family Medicine

## 2008-09-26 ENCOUNTER — Encounter (INDEPENDENT_AMBULATORY_CARE_PROVIDER_SITE_OTHER): Payer: Self-pay | Admitting: Family Medicine

## 2008-09-26 ENCOUNTER — Ambulatory Visit: Payer: Self-pay | Admitting: Family Medicine

## 2008-09-26 DIAGNOSIS — R110 Nausea: Secondary | ICD-10-CM | POA: Insufficient documentation

## 2008-10-08 ENCOUNTER — Ambulatory Visit: Payer: Self-pay | Admitting: Family Medicine

## 2008-10-29 ENCOUNTER — Encounter (INDEPENDENT_AMBULATORY_CARE_PROVIDER_SITE_OTHER): Payer: Self-pay | Admitting: Family Medicine

## 2008-11-19 ENCOUNTER — Ambulatory Visit: Payer: Self-pay | Admitting: Family Medicine

## 2008-12-15 ENCOUNTER — Ambulatory Visit: Payer: Self-pay | Admitting: Family Medicine

## 2008-12-15 DIAGNOSIS — Z9189 Other specified personal risk factors, not elsewhere classified: Secondary | ICD-10-CM

## 2009-01-28 ENCOUNTER — Encounter: Payer: Self-pay | Admitting: Family Medicine

## 2009-03-12 ENCOUNTER — Telehealth: Payer: Self-pay | Admitting: Family Medicine

## 2009-05-18 ENCOUNTER — Telehealth: Payer: Self-pay | Admitting: Family Medicine

## 2009-05-18 ENCOUNTER — Ambulatory Visit: Payer: Self-pay | Admitting: Family Medicine

## 2009-05-18 DIAGNOSIS — M542 Cervicalgia: Secondary | ICD-10-CM | POA: Insufficient documentation

## 2009-05-18 HISTORY — DX: Cervicalgia: M54.2

## 2009-05-18 LAB — CONVERTED CEMR LAB
ALT: 16 units/L (ref 0–35)
Alkaline Phosphatase: 67 units/L (ref 39–117)
Creatinine, Ser: 0.8 mg/dL (ref 0.40–1.20)
HCT: 43.6 % (ref 36.0–46.0)
LH: 19.8 milliintl units/mL
MCHC: 32.1 g/dL (ref 30.0–36.0)
MCV: 100.2 fL — ABNORMAL HIGH (ref 78.0–100.0)
Platelets: 258 10*3/uL (ref 150–400)
Sodium: 140 meq/L (ref 135–145)
Total Bilirubin: 0.3 mg/dL (ref 0.3–1.2)
Total Protein: 7.2 g/dL (ref 6.0–8.3)

## 2009-05-24 ENCOUNTER — Encounter: Payer: Self-pay | Admitting: Family Medicine

## 2009-06-08 ENCOUNTER — Ambulatory Visit: Payer: Self-pay | Admitting: Family Medicine

## 2009-06-08 DIAGNOSIS — H9190 Unspecified hearing loss, unspecified ear: Secondary | ICD-10-CM | POA: Insufficient documentation

## 2009-06-10 ENCOUNTER — Encounter: Admission: RE | Admit: 2009-06-10 | Discharge: 2009-06-10 | Payer: Self-pay | Admitting: Family Medicine

## 2009-06-15 ENCOUNTER — Telehealth: Payer: Self-pay | Admitting: Family Medicine

## 2009-07-06 ENCOUNTER — Ambulatory Visit: Payer: Self-pay | Admitting: Family Medicine

## 2009-08-12 ENCOUNTER — Ambulatory Visit: Payer: Self-pay | Admitting: Family Medicine

## 2009-08-12 ENCOUNTER — Encounter: Payer: Self-pay | Admitting: Family Medicine

## 2009-08-12 LAB — CONVERTED CEMR LAB
BUN: 21 mg/dL (ref 6–23)
Chloride: 103 meq/L (ref 96–112)
Glucose, Bld: 74 mg/dL (ref 70–99)
Potassium: 4.2 meq/L (ref 3.5–5.3)
Sodium: 140 meq/L (ref 135–145)

## 2009-08-28 ENCOUNTER — Telehealth: Payer: Self-pay | Admitting: Family Medicine

## 2009-08-31 ENCOUNTER — Telehealth: Payer: Self-pay | Admitting: Family Medicine

## 2009-09-01 ENCOUNTER — Encounter: Admission: RE | Admit: 2009-09-01 | Discharge: 2009-09-01 | Payer: Self-pay | Admitting: Family Medicine

## 2009-09-01 ENCOUNTER — Ambulatory Visit: Payer: Self-pay | Admitting: Family Medicine

## 2009-09-03 ENCOUNTER — Telehealth: Payer: Self-pay | Admitting: Family Medicine

## 2009-09-10 ENCOUNTER — Telehealth: Payer: Self-pay | Admitting: Family Medicine

## 2009-09-21 ENCOUNTER — Ambulatory Visit: Payer: Self-pay | Admitting: Family Medicine

## 2009-09-21 ENCOUNTER — Encounter: Payer: Self-pay | Admitting: Family Medicine

## 2009-09-21 DIAGNOSIS — N3010 Interstitial cystitis (chronic) without hematuria: Secondary | ICD-10-CM

## 2009-09-21 HISTORY — DX: Interstitial cystitis (chronic) without hematuria: N30.10

## 2009-09-22 ENCOUNTER — Telehealth (INDEPENDENT_AMBULATORY_CARE_PROVIDER_SITE_OTHER): Payer: Self-pay | Admitting: *Deleted

## 2009-09-22 LAB — CONVERTED CEMR LAB
HCT: 40 % (ref 36.0–46.0)
MCHC: 31.5 g/dL (ref 30.0–36.0)
MCV: 100.8 fL — ABNORMAL HIGH (ref 78.0–100.0)
RBC: 3.97 M/uL (ref 3.87–5.11)
Vitamin B-12: 540 pg/mL (ref 211–911)

## 2009-09-24 ENCOUNTER — Telehealth: Payer: Self-pay | Admitting: *Deleted

## 2009-09-29 ENCOUNTER — Telehealth: Payer: Self-pay | Admitting: Family Medicine

## 2009-10-08 ENCOUNTER — Encounter: Payer: Self-pay | Admitting: Family Medicine

## 2009-10-26 ENCOUNTER — Encounter: Payer: Self-pay | Admitting: Family Medicine

## 2009-12-07 ENCOUNTER — Encounter: Payer: Self-pay | Admitting: Family Medicine

## 2009-12-14 ENCOUNTER — Telehealth: Payer: Self-pay | Admitting: Family Medicine

## 2009-12-28 ENCOUNTER — Ambulatory Visit: Payer: Self-pay | Admitting: Family Medicine

## 2010-01-11 ENCOUNTER — Ambulatory Visit: Payer: Self-pay | Admitting: Family Medicine

## 2010-01-11 ENCOUNTER — Encounter: Payer: Self-pay | Admitting: Family Medicine

## 2010-02-22 ENCOUNTER — Ambulatory Visit: Payer: Self-pay | Admitting: Family Medicine

## 2010-02-24 ENCOUNTER — Telehealth: Payer: Self-pay | Admitting: Family Medicine

## 2010-03-05 ENCOUNTER — Telehealth: Payer: Self-pay | Admitting: Family Medicine

## 2010-03-08 ENCOUNTER — Encounter
Admission: RE | Admit: 2010-03-08 | Discharge: 2010-04-29 | Payer: Self-pay | Source: Home / Self Care | Attending: Family Medicine | Admitting: Family Medicine

## 2010-03-29 ENCOUNTER — Ambulatory Visit: Payer: Self-pay | Admitting: Family Medicine

## 2010-03-29 DIAGNOSIS — G609 Hereditary and idiopathic neuropathy, unspecified: Secondary | ICD-10-CM

## 2010-03-29 HISTORY — DX: Hereditary and idiopathic neuropathy, unspecified: G60.9

## 2010-03-29 LAB — CONVERTED CEMR LAB
ALT: 19 units/L (ref 0–35)
BUN: 20 mg/dL (ref 6–23)
CO2: 29 meq/L (ref 19–32)
Calcium: 9.4 mg/dL (ref 8.4–10.5)
Chloride: 100 meq/L (ref 96–112)
Creatinine, Ser: 0.92 mg/dL (ref 0.40–1.20)
HCT: 40.9 % (ref 36.0–46.0)
Hemoglobin: 13.4 g/dL (ref 12.0–15.0)
MCV: 101 fL — ABNORMAL HIGH (ref 78.0–100.0)
Platelets: 196 10*3/uL (ref 150–400)
RBC: 4.05 M/uL (ref 3.87–5.11)
Total Bilirubin: 0.3 mg/dL (ref 0.3–1.2)
Vitamin B-12: 788 pg/mL (ref 211–911)
WBC: 8.9 10*3/uL (ref 4.0–10.5)

## 2010-04-02 ENCOUNTER — Encounter: Admission: RE | Admit: 2010-04-02 | Discharge: 2010-04-02 | Payer: Self-pay | Admitting: Family Medicine

## 2010-04-05 ENCOUNTER — Telehealth: Payer: Self-pay | Admitting: Family Medicine

## 2010-04-05 ENCOUNTER — Encounter: Payer: Self-pay | Admitting: Family Medicine

## 2010-04-05 DIAGNOSIS — R634 Abnormal weight loss: Secondary | ICD-10-CM

## 2010-04-05 DIAGNOSIS — R222 Localized swelling, mass and lump, trunk: Secondary | ICD-10-CM

## 2010-04-05 HISTORY — DX: Localized swelling, mass and lump, trunk: R22.2

## 2010-04-05 HISTORY — DX: Abnormal weight loss: R63.4

## 2010-04-12 ENCOUNTER — Encounter: Payer: Self-pay | Admitting: Family Medicine

## 2010-04-12 ENCOUNTER — Telehealth: Payer: Self-pay | Admitting: Family Medicine

## 2010-04-16 ENCOUNTER — Encounter
Admission: RE | Admit: 2010-04-16 | Discharge: 2010-04-16 | Payer: Self-pay | Source: Home / Self Care | Attending: Family Medicine | Admitting: Family Medicine

## 2010-04-19 ENCOUNTER — Telehealth: Payer: Self-pay | Admitting: Family Medicine

## 2010-04-29 ENCOUNTER — Encounter
Admission: RE | Admit: 2010-04-29 | Discharge: 2010-06-01 | Payer: Self-pay | Source: Home / Self Care | Attending: Family Medicine | Admitting: Family Medicine

## 2010-04-29 ENCOUNTER — Telehealth: Payer: Self-pay | Admitting: *Deleted

## 2010-04-29 ENCOUNTER — Telehealth: Payer: Self-pay | Admitting: Family Medicine

## 2010-05-12 ENCOUNTER — Telehealth: Payer: Self-pay | Admitting: Family Medicine

## 2010-05-23 ENCOUNTER — Encounter: Payer: Self-pay | Admitting: Family Medicine

## 2010-05-24 ENCOUNTER — Encounter: Payer: Self-pay | Admitting: Family Medicine

## 2010-05-24 ENCOUNTER — Ambulatory Visit
Admission: RE | Admit: 2010-05-24 | Discharge: 2010-05-24 | Payer: Self-pay | Source: Home / Self Care | Attending: Family Medicine | Admitting: Family Medicine

## 2010-05-25 ENCOUNTER — Telehealth: Payer: Self-pay | Admitting: Family Medicine

## 2010-05-26 ENCOUNTER — Encounter: Payer: Self-pay | Admitting: Family Medicine

## 2010-05-28 ENCOUNTER — Telehealth: Payer: Self-pay | Admitting: Family Medicine

## 2010-06-01 ENCOUNTER — Encounter: Payer: Self-pay | Admitting: Family Medicine

## 2010-06-03 NOTE — Miscellaneous (Signed)
Summary: chart update  Very sweet patient who tries hard to adhere to therapy. Frequent somatic complaints, most recently cervicalgia that has improved with celebrex. Would try to wean this if possible to minimize CV or stroke risk but pt has been pleased with improvement. Daughter also very sweet and usually translates at visits.   Past History:  Past Medical History: Last updated: 09/21/2009 Fall from comode with head injury - negative CT.  Depression - Often has multiple physical complaints that are most likely due to depression.  h. pylori + 05/02 h/o restless leg syndrome h/o uterine prolapse and benign ovarian mass MMSE--  28/30 03/2003 Spinal stenosis w/ impingement 01/2005 Abd/pelvic CT - gallstone, DJD, ovarian cyst - 09/30/2004 BMD hip t = -3.0, L/S = -1.2 - 10/30/2001 BMD L hip T = -2.5 and -3.0, + response to tx - 06/02/2004 BMD T-score -1.80 - 06/02/1997 Cardiolite-neg - 05/03/1999 carotid dopplers 6/99 neg colonoscopy - extensive diverticulosis, polyp x 2 - 09/30/2004 Incomplete emptying of the bladder Hospitalized 5/10 for fall thought to be due to [place] per family   head CT w/ contrast - int. carotid aneyrysm - 09/30/2004 L-S xray - spondylosis and listhesis, DJD - 01/17/2005 MRI L/S - spinal stenosis - 02/23/2005 pelvic US - R 4 cm complex cyst - 09/28/2004 Pelvic US - R complex ovarian mass - 01/17/2005 R Knee xray-Medial DJD - 01/01/2003 UGI normal - 09/13/2000 CXR COPD, CM and vascular congestion 06/2009 Cervical spine DDD, subluxation C5 on C6 06/2009   chronic interstitial cystitis -- cystocopy 12/09 by Dr. Jeffie Pollock.  Past Surgical History: Last updated: 12/27/2006 B cataract surgery w/ implants  Hysterectomy & BSO - 04/01/2005 Pelvic Lap - benign ovarian mass - 04/18/2005 r carpal tunnel release  Social History: Last updated: 10/29/2008 lives with daughter, Anson [place]; No tob, etoh. Has three total children. 1 g'child.; Dtr [name] (w) [phone], (h) [phone]; dtr [place].  Daughters usually interpret for her at visits and are very involved and ask lots of questions.

## 2010-06-03 NOTE — Progress Notes (Signed)
Summary: phn msg  Phone Note Call from Patient Call back at [phone]   [facility] Summary of Call: her mom is taking Celebrex 2xdaily - increasing gabapentan 1pill 2xdaily shown some improvement - still in a little bit of pain- shot has helped wanted to give this update Initial call taken by: Audie Clear,  Sep 10, 2009 4:25 PM

## 2010-06-03 NOTE — Progress Notes (Signed)
Summary: phn msg  Phone Note Call from Patient Call back at [phone]   Caller: Daughter-Mary York Cerise Summary of Call: Was to call back and let Dr. Sarita Haver know how her mother was doing. Initial call taken by: Raymond Gurney,  Sep 03, 2009 10:45 AM  Follow-up for Phone Call        tried to call. busy signal. Will try again tomorrow. Follow-up by: Eugenie Norrie  MD,  Sep 09, 2009 5:20 PM

## 2010-06-03 NOTE — Assessment & Plan Note (Signed)
Summary: nosebleeds,df   Vital Signs:  Patient profile:   77 year old female Weight:      158.4 pounds Temp:     97.8 degrees F oral Pulse rate:   76 / minute Pulse rhythm:   regular BP sitting:   134 / 70  (left arm) Cuff size:   regular  Vitals Entered By: Audelia Hives CMA (January 11, 2010 10:24 AM) CC: nosebleed appt made with Dr. Erik Obey at [facility] for [place] @ 140PM informed pt daughter [name] before leaving ofc today and she agreed to this appt.  Primary Care Provider:  Eugenie Norrie  MD  CC:  nosebleed.  History of Present Illness: 2 month history of right sided nose bleeds, 4 in the past 24 hours.  Here with her daughter who is translating.  Never had this problem in the past.  Habits & Providers  Alcohol-Tobacco-Diet     Tobacco Status: never     Passive Smoke Exposure: no  Allergies: No Known Drug Allergies  Review of Systems General:  Denies chills and fever. ENT:  Complains of nosebleeds.  Physical Exam  General:  Elderly female Nose:  anterior chamber with no obvious sites of bleeding.   Impression & Recommendations:  Problem # 1:  EPISTAXIS, RECURRENT (ICD-784.7)  ENT today, did spray 12 hour decongestant in nares.  Not bleeding at the time of the visit  Orders: [facility]- Est Level  3 [id number])  Complete Medication List: 1)  Aspirin 81 Mg Tbec (Aspirin) .... Take one tablet daily 2)  Caltrate 600+d 600-400 Mg-unit Tabs (Calcium carbonate-vitamin d) .... One by mouth two times a day 3)  Evista 60 Mg Tabs (Raloxifene hcl) .... Take one tablet daily 4)  Fosamax 70 Mg Tabs (Alendronate sodium) .... Take one tablet every week on tuesday am 5)  Glucosamine 1200 Mg  .... Take one tablet bid 6)  Multivitamin  .... Take one tablet daily 7)  Remeron 45 Mg Tabs (Mirtazapine) .... Take one tablet by mouth q hs 8)  Synthroid 100 Mcg Tabs (Levothyroxine sodium) .... Take one tablet daily 9)  Lidoderm 5 % Ptch (Lidocaine) .Marland Kitchen.. 1 patch to skin once a day. may  cut patch and use 1/4 or 1/2  of patch instead. 10)  Fish Oil Concentrate 1000 Mg Caps (Omega-3 fatty acids) .... 2 tabs daily 11)  Trazodone Hcl 50 Mg Tabs (Trazodone hcl) .... Take 1-2  tablet q hs as needed for insomnia 12)  Lopressor 50 Mg Tabs (Metoprolol tartrate) .... Take 1/2 tab two times a day 13)  Voltaren 1 % Gel (Diclofenac sodium) .... Rub two times a day on painful thumb 14)  Gabapentin 100 Mg Caps (Gabapentin) .... Take one tablet three times a day 15)  Vitamin D 1000 Unit Tabs (Cholecalciferol) .... One tab by mouth daily 16)  Toviaz 8 Mg Xr24h-tab (Fesoterodine fumarate) .... Take one tab daily 17)  Mirtazapine 45 Mg Tabs (Mirtazapine) .... Take 1/2 tab at bedtime 18)  Acetaminophen 650 Mg Cr-tabs (Acetaminophen) .... Take one tab every 6 hours 19)  Tramadol Hcl 50 Mg Tabs (Tramadol hcl) .... Take one tab every 6 hours as needed pain

## 2010-06-03 NOTE — Assessment & Plan Note (Signed)
Summary: f/u ears and neck   Vital Signs:  Patient profile:   77 year old female Height:      59.5 inches Weight:      157 pounds Temp:     97.6 degrees F oral Pulse rate:   67 / minute BP sitting:   123 / 51  (right arm) Cuff size:   regular  Vitals Entered By: Schuyler Amor CMA (July 06, 2009 1:52 PM) CC: F/U Is Patient Diabetic? No Pain Assessment Patient in pain? yes     Location: neck Intensity: 7   Primary Care Provider:  Eugenie Norrie  MD  CC:  F/U.  History of Present Illness: CC: f/u issues  presents with son and daughter MariLou.  1. Neck - continued pain bilateral sides neck.  Taking Gabapentin 100 one pill twice daily.  No change in sxs.  States sleeing with good pillow support below neck.  Mild paresthesias hands but not bothering her.  Asks about gabapentin side effects.  Does not have trouble opening and closing mouth.  2. ear complaint - No associated itchy eyes, runny nose, PND.  mild congestion.  both children with allergies.  Pt feels at times ears pop "like when in an airplane".  Mostly on left side that bothers her.  3. SOB - pt does not notice this at all.  Current Medications (verified): 1)  Aspirin 81 Mg  Tbec (Aspirin) .... Take One Tablet Daily 2)  Calcium Carbonate - Citracal .... Take One Tablet Bid 3)  Evista 60 Mg  Tabs (Raloxifene Hcl) .... Take One Tablet Daily 4)  Fosamax 70 Mg  Tabs (Alendronate Sodium) .... Take One Tablet Every Week On Tuesday Am 5)  Glucosamine 1200 Mg .... Take One Tablet Bid 6)  Multivitamin .... Take One Tablet Daily 7)  Remeron 45 Mg Tabs (Mirtazapine) .... Take One Tablet By Mouth Q Hs 8)  Synthroid 100 Mcg  Tabs (Levothyroxine Sodium) .... Take One Tablet Daily 9)  Cvs Ibuprofen 200 Mg  Caps (Ibuprofen) .... Take 2 Tablets Three Times A Day As Needed Pain - Buys This Otc. 10)  Lidoderm 5 %  Ptch (Lidocaine) .Marland Kitchen.. 1 Patch To Skin Once A Day. May Cut Patch and Use 1/4 or 1/2  of Patch Instead. 11)  Fish Oil  Concentrate 1000 Mg  Caps (Omega-3 Fatty Acids) .... 2 Tabs Daily 12)  Trazodone Hcl 50 Mg Tabs (Trazodone Hcl) .... Take 1/2 Tablet Q Hs As Needed For Insomnia 13)  Lopressor 50 Mg Tabs (Metoprolol Tartrate) .... Take 1/2 Tab Two Times A Day 14)  Voltaren 1 % Gel (Diclofenac Sodium) .... Rub Two Times A Day On Painful Thumb 15)  Gabapentin 100 Mg Caps (Gabapentin) .... Two By Mouth Two Times A Day 16)  Debrox 6.5 % Soln (Carbamide Peroxide) .... 4-5 Drops in Affected Ear Two Times A Day (Label in Spanish)  Allergies (verified): No Known Drug Allergies  Past History:  Past Medical History: Fall from comode with head injury - negative CT.  Depression - Often has multiple physical complaints that are most likely due to depression.  h. pylori + 05/02 h/o restless leg syndrome h/o uterine prolapse and benign ovarian mass MMSE--  28/30 03/2003 Spinal stenosis w/ impingement 01/2005 Abd/pelvic CT - gallstone, DJD, ovarian cyst - 09/30/2004 BMD hip t = -3.0, L/S = -1.2 - 10/30/2001 BMD L hip T = -2.5 and -3.0, + response to tx - 06/02/2004 BMD T-score -1.80 - 06/02/1997 Cardiolite-neg - 05/03/1999  carotid dopplers 6/99 neg colonoscopy - extensive diverticulosis, polyp x 2 - 09/30/2004 Incomplete emptying of the bladder Hospitalized 5/10 for fall thought to be due to [place] per family   head CT w/ contrast - int. carotid aneyrysm - 09/30/2004 L-S xray - spondylosis and listhesis, DJD - 01/17/2005 MRI L/S - spinal stenosis - 02/23/2005 pelvic US - R 4 cm complex cyst - 09/28/2004 Pelvic US - R complex ovarian mass - 01/17/2005 R Knee xray-Medial DJD - 01/01/2003 UGI normal - 09/13/2000 CXR COPD, CM and vascular congestion 06/2009 Cervical spine DDD, subluxation C5 on C6 06/2009   Physical Exam  General:  Alert, appropriate; well-dressed and well-nourished Appears younger than stated age.  Head:  opens and closes mandible symettrically, however there is popping with full opening on left side.  No pain  with palpation of TMJ bilaterally, but there is some referred pain to inside of ear upon palpating L side  Eyes:  PERRL, no injection Ears:  L TM pearly grey, no cerumen, good light reflex R TM - occluded 2/2 hard dark orange cerumen. Nose:  External nasal examination shows no deformity or inflammation. Nasal mucosa are pink and moist without lesions or exudates. Mouth:  Oral mucosa and oropharynx without lesions or exudates. dentures upper and lower. Lungs:  Normal respiratory effort, chest expands symmetrically. Lungs are clear to auscultation, no crackles or wheezes. Heart:  regular rate and rhythm, no murmurs; normal s1/s2    Impression & Recommendations:  Problem # 1:  EAR PAIN, REFERRED (ICD-388.72)  ? of TMJ dysfunction.  instructed in jaw exercises, to discuss with dentist at upcoming appt where she's going to get refitted for dentures.  Orders: [place]- Est  Level 4 ([id number])  Problem # 2:  DECREASED HEARING, RIGHT EAR (ICD-389.9)  prescribed debrox, try to help clean out ear on R side (impaction).  tried cerumen disimpaction last visit already.  Orders: [place]- Est  Level 4 [id number])  Problem # 3:  CERVICALGIA (ICD-723.1) increase gabapentint to 200mg  two times a day.  return for f/u.  still room to go up.  discussed common side effects. Her updated medication list for this problem includes:    Aspirin 81 Mg Tbec (Aspirin) .Marland Kitchen... Take one tablet daily    Cvs Ibuprofen 200 Mg Caps (Ibuprofen) .Marland Kitchen... Take 2 tablets three times a day as needed pain - buys this otc.  Orders: [place]- Est  Level 4 [id number])  Complete Medication List: 1)  Aspirin 81 Mg Tbec (Aspirin) .... Take one tablet daily 2)  Calcium Carbonate - Citracal  .... Take one tablet bid 3)  Evista 60 Mg Tabs (Raloxifene hcl) .... Take one tablet daily 4)  Fosamax 70 Mg Tabs (Alendronate sodium) .... Take one tablet every week on tuesday am 5)  Glucosamine 1200 Mg  .... Take one tablet bid 6)  Multivitamin  .... Take one  tablet daily 7)  Remeron 45 Mg Tabs (Mirtazapine) .... Take one tablet by mouth q hs 8)  Synthroid 100 Mcg Tabs (Levothyroxine sodium) .... Take one tablet daily 9)  Cvs Ibuprofen 200 Mg Caps (Ibuprofen) .... Take 2 tablets three times a day as needed pain - buys this otc. 10)  Lidoderm 5 % Ptch (Lidocaine) .Marland Kitchen.. 1 patch to skin once a day. may cut patch and use 1/4 or 1/2  of patch instead. 11)  Fish Oil Concentrate 1000 Mg Caps (Omega-3 fatty acids) .... 2 tabs daily 12)  Trazodone Hcl 50 Mg Tabs (Trazodone hcl) .... Take 1/2  tablet q hs as needed for insomnia 13)  Lopressor 50 Mg Tabs (Metoprolol tartrate) .... Take 1/2 tab two times a day 14)  Voltaren 1 % Gel (Diclofenac sodium) .... Rub two times a day on painful thumb 15)  Gabapentin 100 Mg Caps (Gabapentin) .... Two by mouth two times a day 16)  Debrox 6.5 % Soln (Carbamide peroxide) .... 4-5 drops in affected ear two times a day (label in spanish)  Patient Instructions: 1)  Suba Gabapentin a 200mg  dos veces al dia. 2)  Creo que usted tiene Temporomandibular Joint Dysfunction (disfuncion temporomandibular).  Esto se puede mejorar con [employer] hemos demostrado hoy.  Tambien lo mejor para hacer es hablar con su dentista en el proximo chequeo sobre otros tratamientos para TMJ (a veces ellos prescriben un mouth guard para paladar para por la noche para mejorar esto). 3)  Para su cerumen, use debrox 4-5 gotitas dos veces al dia en el oido derecho. 4)  Llame a clinica con preguntas.  Gusto verla hoy. Prescriptions: GABAPENTIN 100 MG CAPS (GABAPENTIN) two by mouth two times a day  #120 x 0   Entered by:   Ria Bush  MD   Authorized by:   Candelaria Celeste MD   Signed by:   Ria Bush  MD on 07/06/2009   Method used:   Electronically to        [facility] [phone]* (retail)       [address]       [place], [place]  [address]       Ph: [phone]       Fax: [phone]   RxID:   [id number] DEBROX 6.5 % SOLN  (CARBAMIDE PEROXIDE) 4-5 drops in affected ear two times a day (label in spanish)  #1 x 0   Entered by:   Ria Bush  MD   Authorized by:   Candelaria Celeste MD   Signed by:   Ria Bush  MD on 07/06/2009   Method used:   Electronically to        [facility] [phone]* (retail)       [address]       [place], [place]  [address]       Ph: [phone]       Fax: [phone]   RxID:   [id number]

## 2010-06-03 NOTE — Assessment & Plan Note (Signed)
Summary: neck pain.wants to up meds/[place]/smith   Vital Signs:  Patient profile:   77 year old female Height:      59.5 inches Weight:      161 pounds BMI:     32.09 Temp:     98.6 degrees F oral Pulse rate:   67 / minute BP sitting:   107 / 58  (left arm) Cuff size:   regular  Vitals Entered By: Schuyler Amor CMA (December 28, 2009 1:50 PM) CC: neck pain Pain Assessment Patient in pain? yes     Location: neck   Primary Care Jona Erkkila:  Eugenie Norrie  MD  CC:  neck pain.  History of Present Illness: She is mostly bothered by neck pain. 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  No numbness or weakness in the arms other than the weak abduction of the right thumb since her carpal tunnel surgery 15 years ago. Arthralgias in her fingers move from place to place. She doesn't walk much due to unsteadiness. Holds on to her children because she doesn't want to use a cane.   Asks for something more to take to go to sleep. The one Trazadone doesn't always help. Doesn't feel more than mildly depressed, mainly bored, but still takes Mirtazapine. Appetite is very good.       Current Medications (verified): 1)  Aspirin 81 Mg  Tbec (Aspirin) .... Take One Tablet Daily 2)  Caltrate 600+d 600-400 Mg-Unit Tabs (Calcium Carbonate-Vitamin D) .... One By Mouth Two Times A Day 3)  Evista 60 Mg  Tabs (Raloxifene Hcl) .... Take One Tablet Daily 4)  Fosamax 70 Mg  Tabs (Alendronate Sodium) .... Take One Tablet Every Week On Tuesday Am 5)  Glucosamine 1200 Mg .... Take One Tablet Bid 6)  Multivitamin .... Take One Tablet Daily 7)  Remeron 45 Mg Tabs (Mirtazapine) .... Take One Tablet By Mouth Q Hs 8)  Synthroid 100 Mcg  Tabs (Levothyroxine Sodium) .... Take One Tablet Daily 9)  Lidoderm 5 %  Ptch (Lidocaine) .Marland Kitchen.. 1 Patch To Skin Once A Day. May Cut Patch and Use 1/4 or 1/2  of Patch Instead. 10)  Fish Oil Concentrate 1000 Mg  Caps (Omega-3 Fatty Acids) .... 2 Tabs Daily 11)   Trazodone Hcl 50 Mg Tabs (Trazodone Hcl) .... Take 1-2  Tablet Q Hs As Needed For Insomnia 12)  Lopressor 50 Mg Tabs (Metoprolol Tartrate) .... Take 1/2 Tab Two Times A Day 13)  Voltaren 1 % Gel (Diclofenac Sodium) .... Rub Two Times A Day On Painful Thumb 14)  Gabapentin 100 Mg Caps (Gabapentin) .... Take One Tablet Three Times A Day 15)  Vitamin D 1000 Unit Tabs (Cholecalciferol) .... One Tab By Mouth Daily 16)  Toviaz 8 Mg Xr24h-Tab (Fesoterodine Fumarate) .... Take One Tab Daily 17)  Mirtazapine 45 Mg Tabs (Mirtazapine) .... Take 1/2 Tab At Bedtime 18)  Acetaminophen 650 Mg Cr-Tabs (Acetaminophen) .... Take One Tab Every 6 Hours 19)  Tramadol Hcl 50 Mg Tabs (Tramadol Hcl) .... Take One Tab Every 6 Hours As Needed Pain  Allergies (verified): No Known Drug Allergies  Physical Exam  General:  vital signs reviewed and normal Alert, appropriate; well-dressed and well-nourished  Neck:  No deformities, masses, or tenderness noted. Lungs:  work of breathing unlabored, clear to auscultation bilaterally; no wheezes, rales, or ronchi; good air movement throughout  Heart:  regular rate and rhythm, no murmurs; normal s1/s2  Abdomen:  +BS, soft, non-tender,  non-distended; no rebound/guarding; has 5 x 5 cm ventral hernia that is non-tender to palpation. No evidence of strangulation or encarceration. RLQ surgical scar well healed.  Msk:  normal ROM shoulders for age, but kyphotic and decreased ability to look up and to sides, though no arm sx produced. degenerative joint disease changes of fingers with Heberdens and Bouchart's nodes. Inability to abduct the right thumb.      Impression & Recommendations:  Problem # 1:  CERVICALGIA (ICD-723.1) degenerative joint disease and degenerative disk disease, but minimal radicular symptoms.  The following medications were removed from the medication list:    Celebrex 100 Mg Caps (Celecoxib) ..... One by mouth two times a day Her updated medication list for  this problem includes:    Aspirin 81 Mg Tbec (Aspirin) .Marland Kitchen... Take one tablet daily    Acetaminophen 650 Mg Cr-tabs (Acetaminophen) .Marland Kitchen... Take one tab every 6 hours    Tramadol Hcl 50 Mg Tabs (Tramadol hcl) .Marland Kitchen... Take one tab every 6 hours as needed pain  Orders: [place]- Est  Level 4 [id number])  Problem # 2:  HYPERTENSION, BENIGN SYSTEMIC (ICD-401.1) well controlled Her updated medication list for this problem includes:    Lopressor 50 Mg Tabs (Metoprolol tartrate) .Marland Kitchen... Take 1/2 tab two times a day  Orders: [facility]- Est  Level 4 [id number])  Problem # 3:  INSOMNIA NOS (ICD-780.52) May take a second Trazadone if needed, but hopefully the smaller dose of Mirtazapine will increase drowsiness Orders: [place]- Est  Level 4 [id number])  Complete Medication List: 1)  Aspirin 81 Mg Tbec (Aspirin) .... Take one tablet daily 2)  Caltrate 600+d 600-400 Mg-unit Tabs (Calcium carbonate-vitamin d) .... One by mouth two times a day 3)  Evista 60 Mg Tabs (Raloxifene hcl) .... Take one tablet daily 4)  Fosamax 70 Mg Tabs (Alendronate sodium) .... Take one tablet every week on tuesday am 5)  Glucosamine 1200 Mg  .... Take one tablet bid 6)  Multivitamin  .... Take one tablet daily 7)  Remeron 45 Mg Tabs (Mirtazapine) .... Take one tablet by mouth q hs 8)  Synthroid 100 Mcg Tabs (Levothyroxine sodium) .... Take one tablet daily 9)  Lidoderm 5 % Ptch (Lidocaine) .Marland Kitchen.. 1 patch to skin once a day. may cut patch and use 1/4 or 1/2  of patch instead. 10)  Fish Oil Concentrate 1000 Mg Caps (Omega-3 fatty acids) .... 2 tabs daily 11)  Trazodone Hcl 50 Mg Tabs (Trazodone hcl) .... Take 1-2  tablet q hs as needed for insomnia 12)  Lopressor 50 Mg Tabs (Metoprolol tartrate) .... Take 1/2 tab two times a day 13)  Voltaren 1 % Gel (Diclofenac sodium) .... Rub two times a day on painful thumb 14)  Gabapentin 100 Mg Caps (Gabapentin) .... Take one tablet three times a day 15)  Vitamin D 1000 Unit Tabs (Cholecalciferol) .... One tab by  mouth daily 16)  Toviaz 8 Mg Xr24h-tab (Fesoterodine fumarate) .... Take one tab daily 17)  Mirtazapine 45 Mg Tabs (Mirtazapine) .... Take 1/2 tab at bedtime 18)  Acetaminophen 650 Mg Cr-tabs (Acetaminophen) .... Take one tab every 6 hours 19)  Tramadol Hcl 50 Mg Tabs (Tramadol hcl) .... Take one tab every 6 hours as needed pain  Patient Instructions: 1)  Please schedule a follow-up appointment in 1 month.  2)  Baje la dosis de Mirtazapine hasta la mitad cada noche 3)  Puede tomar una segunda tableta de Trazadone si not funciono' la primera 4)  Tome Tramadol  con Acetaminophen para dolor mas fuerte.  Prescriptions: TRAZODONE HCL 50 MG TABS (TRAZODONE HCL) Take 1-2  tablet q hs as needed for insomnia  #45 x 6   Entered and Authorized by:   Jordan Celeste MD   Signed by:   Jordan Celeste MD on 12/28/2009   Method used:   Print then Give to Patient   RxID:   [id number] TRAMADOL HCL 50 MG TABS (TRAMADOL HCL) Take one tab every 6 hours as needed pain  #60 x 3   Entered and Authorized by:   Jordan Celeste MD   Signed by:   Jordan Celeste MD on 12/28/2009   Method used:   Electronically to        [facility] [phone]* (retail)       [address]       [place], [place]  [address]       Ph: [phone]       Fax: [phone]   RxID:   [id number] MIRTAZAPINE 45 MG TABS (MIRTAZAPINE) Take 1/2 tab at bedtime  #15 x 6   Entered and Authorized by:   Jordan Celeste MD   Signed by:   Jordan Celeste MD on 12/28/2009   Method used:   Electronically to        [facility] [phone]* (retail)       [address]       [place], [place]  [address]       Ph: [phone]       Fax: [phone]   RxID:   [id number] TRAZODONE HCL 50 MG TABS (TRAZODONE HCL) Take 1-2  tablet q hs as needed for insomnia  #45 x 6   Entered and Authorized by:   Jordan Celeste MD   Signed by:   Jordan Celeste MD on 12/28/2009   Method used:   Electronically to        [facility] [phone]* (retail)       [address]        [place], [place]  [address]       Ph: [phone]       Fax: [phone]   RxID:   [id number]     Prevention & Chronic Care Immunizations   Influenza vaccine: refused  (05/12/2008)   Influenza vaccine due: 05/12/2009    Tetanus booster: 05/18/2009: Td   Tetanus booster due: 05/19/2019    Pneumococcal vaccine: Done.  (01/30/1997)   Pneumococcal vaccine deferral: Not indicated  (08/12/2009)   Pneumococcal vaccine due: None    H. zoster vaccine: Not documented  Colorectal Screening   Hemoccult: Done.  (05/02/2004)   Hemoccult due: Not Indicated    Colonoscopy: Done.  (08/30/2004)   Colonoscopy due: 08/31/2014  Other Screening   Pap smear: Done.  (06/02/2000)   Pap smear due: Not Indicated    Mammogram: ASSESSMENT: Negative - BI-RADS 1^MM DIGITAL SCREENING  (09/01/2009)   Mammogram action/deferral: Not indicated  (08/12/2009)   Mammogram due: 08/11/2009    DXA bone density scan: Done.  (06/07/2004)   DXA bone density action/deferral: Not indicated  (12/15/2008)   DXA scan due: None    Smoking status: never  (09/21/2009)  Lipids   Total Cholesterol: 215  (09/21/2007)   LDL: 96  (09/21/2007)   LDL Direct: Not documented   HDL: 57  (09/21/2007)   Triglycerides: 310  (09/21/2007)  Hypertension   Last Blood Pressure: 107 / 58  (12/28/2009)   Serum creatinine: 0.77  (  08/12/2009)   Serum potassium 4.2  (08/12/2009)    Hypertension flowsheet reviewed?: Yes   Progress toward BP goal: At goal  Self-Management Support :   Personal Goals (by the next clinic visit) :      Personal blood pressure goal: 140/90  (12/15/2008)   Hypertension self-management support: Not documented

## 2010-06-03 NOTE — Assessment & Plan Note (Signed)
Summary: pain in back/neck,df   Vital Signs:  Patient profile:   77 year old female Height:      59.5 inches Weight:      153.5 pounds BMI:     30.59 Temp:     97.9 degrees F Pulse rate:   86 / minute BP sitting:   145 / 69  (left arm) Cuff size:   regular  Vitals Entered By: Levert Feinstein LPN (January 23, [id number] 11:08 AM) CC: Pain in back and neck Is Patient Diabetic? No Pain Assessment Patient in pain? yes     Location: back/neck   Primary Care Provider:  Hulan Saas DO  CC:  Pain in back and neck.  History of Present Illness: 1) Neck pain: Started approximately one year ago. On review of office visit notes and in discussion with patient, prior treatments have included hydrocodone/APAP (no relief), Ultram (no relief), gabapentin (some relief), Toradol injection while in the office (transient - maybe 2 weeks to a month of relief), physical therapy (some relief). Imaging performed in 2011 demonstrated cervical spinal stenosis a multple levels - (4.5 mm degenerative anterolisthesis at C5-6 results in severe right foraminal stenosis,  moderate central canal stenosis at C5-6 is worse on the right,  mild left foraminal narrowing C5-6, moderate central and bilateral foraminal stenosis at C6-7, worse on the right, mild right foraminal narrowing at C2-3, moderate left foraminal stenosis at C3-4, mild central and bilateral foraminal narrowing and C4-5)   Patient was referred to Dr. Joya Salm for neurosurgical evaulation (patient had requested Spanish speaking neurosurgeon) but [facility] her appointment and as a result Dr. Harley Hallmark office told her that she would have to be referred again. Daughter is upset today about this.   Pain is located posterior neck and shoots down bilateral arms. Patient reports some bilateral hand pain and numbness without weakness.   Denies recent bowel or bladder changes.  Habits & Providers  Alcohol-Tobacco-Diet     Tobacco Status: never     Passive Smoke  Exposure: no  Medications Prior to Update: 1)  Aspirin 81 Mg  Tbec (Aspirin) .... Take One Tablet Daily 2)  Caltrate 600+d 600-400 Mg-Unit Tabs (Calcium Carbonate-Vitamin D) .... One By Mouth Two Times A Day 3)  Evista 60 Mg  Tabs (Raloxifene Hcl) .... Take One Tablet Daily 4)  Fosamax 70 Mg  Tabs (Alendronate Sodium) .... Take One Tablet Every Week On Tuesday Am 5)  Glucosamine 1200 Mg .... Take One Tablet Bid 6)  Multivitamin .... Take One Tablet Daily 7)  Synthroid 100 Mcg  Tabs (Levothyroxine Sodium) .... Take One Tablet Daily 8)  Fish Oil Concentrate 1000 Mg  Caps (Omega-3 Fatty Acids) .... 2 Tabs Daily 9)  Trazodone Hcl 50 Mg Tabs (Trazodone Hcl) .... Take 1 At Night As Needed For Insomnia 10)  Metoprolol Tartrate 25 Mg Tabs (Metoprolol Tartrate) .... Take 1/2  Tab Twice Daily 11)  Vitamin D 1000 Unit Tabs (Cholecalciferol) .... One Tab By Mouth Daily 12)  Toviaz 8 Mg Xr24h-Tab (Fesoterodine Fumarate) .... Take One Tab Daily 13)  Mirtazapine 45 Mg Tabs (Mirtazapine) .... Take 1/2  Tab At Bedtime 14)  Acetaminophen 650 Mg Cr-Tabs (Acetaminophen) .... Take One Tab Every 6 Hours 15)  Hydrocodone-Acetaminophen 5-500 Mg Tabs (Hydrocodone-Acetaminophen) .... Take 1/2 To 1 Tab 4 Times Daily  Allergies (verified): No Known Drug Allergies  Physical Exam  General:  Vitals reviewed.  Obese.  No acute distress except for discomfort in her neck Neck:  Mildly head forward  position with decreased range of motion and tight paraspinal muscles Lungs:  normal respiratory effort, no crackles, and no wheezes.   Msk:  Neck: pain reproduced with lateral bending and rotation.  No gross deformity. Decreased range of to look up and to sides, though no arm signs motion with flexion and extension. Negative Spurlings  Shoulders: normal ROM shoulders for age  Cervical spine: kyphotic Neurologic:  strength normal in all extremities and sensation intact to light touch except decreased sensation bilateral  thumbs   Impression & Recommendations:  Problem # 1:  CERVICALGIA (ICD-723.1) Assessment Unchanged  Stable. Will refer to Neurosurgery AGAIN (original referral was for consideration of steroid injection). No red flag symptoms.  Advised patient's daughter on importance of taking mom to appointments - she also has [facility] here. Will give Toradol injection today (last given in May 2011) but would not give regular NSAIDs given age. Will treat with Neurontin as this has worked in the past - will titrate up dose slowly and will discontinue trazodone to help prevent drowsiness. Will stop hydrocodone / APAP since not working (also concern that she is taking too much Tylenol). Follow up with PCP in two weeks.   Her updated medication list for this problem includes:    Aspirin 81 Mg Tbec (Aspirin) .Marland Kitchen... Take one tablet daily    Acetaminophen 650 Mg Cr-tabs (Acetaminophen) .Marland Kitchen... Take one tab every 6 hours    Hydrocodone-acetaminophen 5-500 Mg Tabs (Hydrocodone-acetaminophen) .Marland Kitchen... Take 1/2 to 1 tab 4 times daily  Orders: Neurosurgeon Referral (Neurosurgeon) [facility]- Est  Level 4 [id number])  Complete Medication List: 1)  Aspirin 81 Mg Tbec (Aspirin) .... Take one tablet daily 2)  Caltrate 600+d 600-400 Mg-unit Tabs (Calcium carbonate-vitamin d) .... One by mouth two times a day 3)  Evista 60 Mg Tabs (Raloxifene hcl) .... Take one tablet daily 4)  Fosamax 70 Mg Tabs (Alendronate sodium) .... Take one tablet every week on tuesday am 5)  Glucosamine 1200 Mg  .... Take one tablet bid 6)  Multivitamin  .... Take one tablet daily 7)  Synthroid 100 Mcg Tabs (Levothyroxine sodium) .... Take one tablet daily 8)  Fish Oil Concentrate 1000 Mg Caps (Omega-3 fatty acids) .... 2 tabs daily 9)  Metoprolol Tartrate 25 Mg Tabs (Metoprolol tartrate) .... Take 1/2  tab twice daily 10)  Vitamin D 1000 Unit Tabs (Cholecalciferol) .... One tab by mouth daily 11)  Toviaz 8 Mg Xr24h-tab (Fesoterodine fumarate) .... Take one tab  daily 12)  Mirtazapine 45 Mg Tabs (Mirtazapine) .... Take 1/2  tab at bedtime 13)  Acetaminophen 650 Mg Cr-tabs (Acetaminophen) .... Take one tab every 6 hours 14)  Hydrocodone-acetaminophen 5-500 Mg Tabs (Hydrocodone-acetaminophen) .... Take 1/2 to 1 tab 4 times daily 15)  Neurontin 100 Mg Caps (Gabapentin) .... One tab by mouth at bedtime x 1 week then 1 tab by mouth qam and at bedtime x 1 week then 1 tab by mouth three times a day  Other Orders: Ketorolac-Toradol 15mg  [id number])  Patient Instructions: 1)  Follow up with Dr. Tamala Julian in two weeks. 2)  If the Neurontin makes you sleepy then stop taking  3)  We will refer you to a different Neurosurgeon - PLEASE keep this appointment Prescriptions: NEURONTIN 100 MG CAPS (GABAPENTIN) one tab by mouth at bedtime x 1 week then 1 tab by mouth QAM and at bedtime x 1 week then 1 tab by mouth three times a day  #60 x 0   Entered and Authorized by:  Mariana Arn  MD   Signed by:   Mariana Arn  MD on 05/24/2010   Method used:   Electronically to        [facility] [phone]* (retail)       [address]       [place], [place]  [address]       Ph: [phone]       Fax: [phone]   RxID:   [id number]    Medication Administration  Injection # 1:    Medication: Ketorolac-Toradol 15mg     Diagnosis: OSTEOPOROSIS (ICD-733.00)    Route: IM    Site: RUOQ gluteus    Exp Date: 08/01/2011    Lot #: [id number]    Mfr: [email or web address]    Patient tolerated injection without complications    Given by: Christen Bame CMA (May 24, 2010 12:09 PM)  Orders Added: 1)  Ketorolac-Toradol 15mg  [J1885] 2)  Neurosurgeon Referral [Neurosurgeon] 3)  [facility]- Est  Level 4 [id number]     Medication Administration  Injection # 1:    Medication: Ketorolac-Toradol 15mg     Diagnosis: OSTEOPOROSIS (ICD-733.00)    Route: IM    Site: RUOQ gluteus    Exp Date: 08/01/2011    Lot #: [id number]    Mfr: [email or web address]    Patient tolerated injection without complications     Given by: Christen Bame CMA (May 24, 2010 12:09 PM)  Orders Added: 1)  Ketorolac-Toradol 15mg  [J1885] 2)  Neurosurgeon Referral [Neurosurgeon] 3)  [facility]- Est  Level 4 [id number]

## 2010-06-03 NOTE — Progress Notes (Signed)
Summary: lab results  Phone Note Call from Patient Call back at [phone]   Caller: Daughter-Jordan [name] Summary of Call: daughter is requesting that we fax her labs to her - f [phone] Initial call taken by: Audie Clear,  Sep 24, 2009 1:46 PM  Follow-up for Phone Call        spoke with Jordan [name]. md has already reviewed with pt's family. sent to her office. states she has her own fax in her office & will stand by to get it Follow-up by: Elige Radon RN,  Sep 24, 2009 1:51 PM

## 2010-06-03 NOTE — Letter (Signed)
Summary: *Referral Letter  [facility]  [address]   [place], [place] [address]   Phone: [phone]  Fax: [phone]    04/12/2010 Dear Dr Joya Salm,  Thank you in advance for agreeing to see my patient:  Jordan [name] [address] [place], [place]  [address]  Phone: [phone]  Reason for Referral: Evaluation and recommendation for treatment of severe chronic neck pain. She has improved a little with physical therapy and medication. The question of a right apical lung neoplasm vs scar was a surprise. She has lost a few pounds, but hasn't had a significant cough. She is [place], and would benefit from an explanation in [place], though her children speak English well.   Procedures Requested: Consideration of steroid injection. I have deferred chest CT in case you would like a neck CT procedure at the same time.   Current Medical Problems: 1)  NAUSEA (ICD-787.02) 2)  PERIPHERAL NEUROPATHY (ICD-356.9) 3)  DECREASED HEARING, RIGHT EAR (ICD-389.9) 4)  CERVICALGIA (ICD-723.1) 5)  ACCIDENTAL FALL, HX OF (ICD-V15.89) 6)  HYPERTENSION, BENIGN SYSTEMIC (ICD-401.1) 7)  HYPOTHYROIDISM, UNSPECIFIED (ICD-244.9) 8)  DEPRESSIVE DISORDER, NOS (ICD-311) 9)  OSTEOPOROSIS (ICD-733.00) 10)  INSOMNIA NOS (ICD-780.52) 11)  GALLSTONES (ICD-574.20) 12)  HERNIA, INCISIONAL VENTRAL W/O OBST/GNGR (ICD-553.21) 13)  OSTEOARTHRITIS, MULTI SITES (ICD-715.98) 14)  INTERSTITIAL CYSTITIS (ICD-595.1) 15)  INCOMPLETE BLADDER EMPTYING (ICD-788.21) 16)  CYSTOCELE/RECTOCELE/PROLAPSE,UNSPEC. (ICD-618.9) 17)      INCONTINENCE, URGE (ICD-788.31) 18)      INCONTINENCE, STRESS, FEMALE (ICD-625.6) 19)  DIVERTICULOSIS OF COLON (ICD-562.10) 20)  CARPAL TUNNEL SYNDROME (ICD-354.0)   Current Medications: 1)  ASPIRIN 81 MG  TBEC (ASPIRIN) Take one tablet daily 2)  CALTRATE 600+D 600-400 MG-UNIT TABS (CALCIUM CARBONATE-VITAMIN D) one by mouth two times a day 3)  EVISTA 60 MG  TABS (RALOXIFENE HCL)  Take one tablet daily 4)  FOSAMAX 70 MG  TABS (ALENDRONATE SODIUM) Take one tablet every week on Tuesday AM 5)  * GLUCOSAMINE 1200 MG Take one tablet BID 6)  * MULTIVITAMIN Take one tablet daily 7)  SYNTHROID 100 MCG  TABS (LEVOTHYROXINE SODIUM) Take one tablet daily 8)  FISH OIL CONCENTRATE 1000 MG  CAPS (OMEGA-3 FATTY ACIDS) 2 tabs daily 9)  TRAZODONE HCL 50 MG TABS (TRAZODONE HCL) Take 1 at night as needed for insomnia 10)  METOPROLOL TARTRATE 25 MG TABS (METOPROLOL TARTRATE) Take 1/2  tab twice daily 11)  VITAMIN D 1000 UNIT TABS (CHOLECALCIFEROL) one tab by mouth daily 12)  TOVIAZ 8 MG XR24H-TAB (FESOTERODINE FUMARATE) Take one tab daily 13)  MIRTAZAPINE 45 MG TABS (MIRTAZAPINE) Take 1/2  tab at bedtime 14)  ACETAMINOPHEN 650 MG CR-TABS (ACETAMINOPHEN) Take one tab every 6 hours 15)  HYDROCODONE-ACETAMINOPHEN 5-500 MG TABS (HYDROCODONE-ACETAMINOPHEN) Take 1/2 to 1 tab 4 times daily   Past Medical History: 1)  Fall from comode with head injury - negative CT.  2)  Depression - Often has multiple physical complaints that are most likely due to depression.  3)  h. pylori + 05/02 4)  h/o restless leg syndrome 5)  h/o uterine prolapse and benign ovarian mass 6)  MMSE--  28/30 03/2003 7)  Spinal stenosis w/ impingement 01/2005 8)  Abd/pelvic CT - gallstone, DJD, ovarian cyst - 09/30/2004 9)  BMD hip t = -3.0, L/S = -1.2 - 10/30/2001 10)  BMD L hip T = -2.5 and -3.0, + response to tx - 06/02/2004 11)  BMD T-score -1.80 - 06/02/1997 12)  Cardiolite-neg - 05/03/1999 13)  carotid  dopplers 6/99 neg 14)  colonoscopy - extensive diverticulosis, polyp x 2 - 09/30/2004 15)  Incomplete emptying of the bladder 16)  Hospitalized 5/10 for fall thought to be due to [place] per family  17)  head CT w/ contrast - int. carotid aneyrysm - 09/30/2004 18)  L-S xray - spondylosis and listhesis, DJD - 01/17/2005 19)  MRI L/S - spinal stenosis - 02/23/2005 20)  pelvic US - R 4 cm complex cyst - 09/28/2004 21)  Pelvic US -  R complex ovarian mass - 01/17/2005 22)  R Knee xray-Medial DJD - 01/01/2003 23)  UGI normal - 09/13/2000 24)  CXR COPD, CM and vascular congestion 06/2009 25)  Cervical spine DDD, subluxation C5 on C6 06/2009  26)  chronic interstitial cystitis -- cystocopy 12/09 by Dr. Jeffie Pollock.  Pertinent Labs: Attached   Thank you again for agreeing to see our patient; please contact us if you have any further questions or need additional information.  Sincerely,  Candelaria Celeste MD  Appended Document: *Referral Letter faxed

## 2010-06-03 NOTE — Progress Notes (Signed)
----   Converted from flag ---- ---- 05/11/2010 5:08 PM, Schuyler Amor CMA wrote: Pt no showed appt with Dr Joya Salm at [facility]. ------------------------------

## 2010-06-03 NOTE — Progress Notes (Signed)
Summary: phn msg  Phone Note Call from Patient Call back at [phone]   Caller: Daughter-Mary York Cerise Summary of Call: continues w/ pain in neck and making her depressed- wants to know if there is any shots that she can get to help with this. taking Tylenol for the last 2 weeks and no help at all. Initial call taken by: Audie Clear,  August 28, 2009 2:19 PM  Follow-up for Phone Call        spoke with dtr. she wants her to see pcp. appt for tuesday am.  wants md to know so if there is something he can order before then, she would appeiciate it Follow-up by: Elige Radon RN,  August 28, 2009 3:27 PM  Additional Follow-up for Phone Call Additional follow up Details #1::        called & LM for dtr to call back. will make appt for am workin for Dr. Sarita Haver Additional Follow-up by: Elige Radon RN,  Aug 31, 2009 12:30 PM    Additional Follow-up for Phone Call Additional follow up Details #2::    dtr will have Ms. Filtz here by 10am to see pcp Follow-up by: Elige Radon RN,  Aug 31, 2009 2:02 PM

## 2010-06-03 NOTE — Progress Notes (Signed)
Summary: phn msg  Phone Note Call from Patient Call back at [phone]   Caller: Daughter-Mary York Cerise Summary of Call: Mom recieved call from Dr. but does not speak [place].  Can the daughter be called and told what was said. Initial call taken by: Raymond Gurney,  Sep 22, 2009 10:15 AM  Follow-up for Phone Call        spoke with daughter and voiced understanding of apt time. Follow-up by: Isabelle Course,  Sep 22, 2009 10:57 AM

## 2010-06-03 NOTE — Assessment & Plan Note (Signed)
Summary: neck pain,tcb   Vital Signs:  Patient profile:   77 year old female Height:      59.5 inches Weight:      156 pounds BMI:     31.09 Temp:     98.0 degrees F oral Pulse rate:   67 / minute BP sitting:   148 / 61  (left arm) Cuff size:   regular  Vitals Entered By: Enid Skeens, CMA (February 22, 2010 4:16 PM) CC: neck pain x1 month Is Patient Diabetic? No Pain Assessment Patient in pain? yes     Location: neck Intensity: 8 Type: sharp   Primary Care Provider:  Eugenie Norrie  MD  CC:  neck pain x1 month.  History of Present Illness: 1. Neck pain: Patient presents as a work in for evaluation of neck pain and finger pain.  Patient with a history of arthritis and neck pain.  She was seen for a similar complaint a couple of months ago.  Diagnosed with arthritis and told to take Tylenol.  She hasn't been taking the Lidocaine patch or the Tramadol.  She is unsure if the Tramadol helped when she took it but she doesn't think that the Tylenol is doing much.  Pain is located on both sides of the neck.  Pain is worse when she looks side to side.  Not tender to palpation.    Pain currently rated an 8/10.  Described as a pulling in her neck.  ROS: endorses some numbness in her hands.  Denies pain radiating down the arms.  Denies loss of bowel / bladder function.  Denies loss of ROM of the neck.  Habits & Providers  Alcohol-Tobacco-Diet     Tobacco Status: never  Current Medications (verified): 1)  Aspirin 81 Mg  Tbec (Aspirin) .... Take One Tablet Daily 2)  Caltrate 600+d 600-400 Mg-Unit Tabs (Calcium Carbonate-Vitamin D) .... One By Mouth Two Times A Day 3)  Evista 60 Mg  Tabs (Raloxifene Hcl) .... Take One Tablet Daily 4)  Fosamax 70 Mg  Tabs (Alendronate Sodium) .... Take One Tablet Every Week On Tuesday Am 5)  Glucosamine 1200 Mg .... Take One Tablet Bid 6)  Multivitamin .... Take One Tablet Daily 7)  Synthroid 100 Mcg  Tabs (Levothyroxine Sodium) .... Take One Tablet  Daily 8)  Fish Oil Concentrate 1000 Mg  Caps (Omega-3 Fatty Acids) .... 2 Tabs Daily 9)  Trazodone Hcl 50 Mg Tabs (Trazodone Hcl) .... Take 1 At Night As Needed For Insomnia 10)  Lopressor 50 Mg Tabs (Metoprolol Tartrate) .... Take 1/2 Tab Two Times A Day 11)  Vitamin D 1000 Unit Tabs (Cholecalciferol) .... One Tab By Mouth Daily 12)  Toviaz 8 Mg Xr24h-Tab (Fesoterodine Fumarate) .... Take One Tab Daily 13)  Mirtazapine 45 Mg Tabs (Mirtazapine) .... Take 1 Tab At Bedtime 14)  Acetaminophen 650 Mg Cr-Tabs (Acetaminophen) .... Take One Tab Every 6 Hours 15)  Tramadol Hcl 50 Mg Tabs (Tramadol Hcl) .... 1/2 Tab By Mouth Daily For Pain  Allergies: No Known Drug Allergies  Social History: Reviewed history from 10/29/2008 and no changes required. lives by herself, [place]; No tob, etoh. Has three total children. 1 g'child.; Dtr [name] (w) [phone], (h) [phone]; dtr [place]. Daughters usually interpret for her at visits and are very involved and ask lots of questions.   Physical Exam  General:  Vitals reviewed.  Obese.  No acute distress Lungs:  work of breathing unlabored Msk:  Neck: pain reproduced with lateral side  to side gaze.  Non tender.  No gross deformity.  decreased ability to look up and to sides, though no arm sx produced.  Negative Spurlings  Shoulders: normal ROM shoulders for age  Cervical spine: kyphotic  Hands: degenerative joint disease changes of fingers with Heberdens and Bouchart's nodes.  Some swelling in the left middle finger.  No redness, warmth.  Slightly decreased range of motion.   Impression & Recommendations:  Problem # 1:  CERVICALGIA (F4948081.1) Assessment Deteriorated  Pain not controlled.  Prior cervical x-rays show arthritis and sponylosis.  Patient refused wanting surgery even if that was an option so will not get advanced imaging.  Will try Tramadol again for pain.  Will refer patient to physical therapy to help with stretching and ROM  exercises. Her updated medication list for this problem includes:    Aspirin 81 Mg Tbec (Aspirin) .Marland Kitchen... Take one tablet daily    Acetaminophen 650 Mg Cr-tabs (Acetaminophen) .Marland Kitchen... Take one tab every 6 hours    Tramadol Hcl 50 Mg Tabs (Tramadol hcl) .Marland Kitchen... 1/2 tab by mouth daily for pain  Orders: Physical Therapy Referral (PT) [place]- Est Level  3 [id number])  Problem # 2:  OSTEOARTHRITIS, MULTI SITES (ICD-715.98) Assessment: Deteriorated  Still having some pain in her fingers, worse in her left middle finger.  Will also try Tramadol for this. Her updated medication list for this problem includes:    Aspirin 81 Mg Tbec (Aspirin) .Marland Kitchen... Take one tablet daily    Acetaminophen 650 Mg Cr-tabs (Acetaminophen) .Marland Kitchen... Take one tab every 6 hours    Tramadol Hcl 50 Mg Tabs (Tramadol hcl) .Marland Kitchen... 1/2 tab by mouth daily for pain  Orders: [place]- Est Level  3 [id number])  Complete Medication List: 1)  Aspirin 81 Mg Tbec (Aspirin) .... Take one tablet daily 2)  Caltrate 600+d 600-400 Mg-unit Tabs (Calcium carbonate-vitamin d) .... One by mouth two times a day 3)  Evista 60 Mg Tabs (Raloxifene hcl) .... Take one tablet daily 4)  Fosamax 70 Mg Tabs (Alendronate sodium) .... Take one tablet every week on tuesday am 5)  Glucosamine 1200 Mg  .... Take one tablet bid 6)  Multivitamin  .... Take one tablet daily 7)  Synthroid 100 Mcg Tabs (Levothyroxine sodium) .... Take one tablet daily 8)  Fish Oil Concentrate 1000 Mg Caps (Omega-3 fatty acids) .... 2 tabs daily 9)  Trazodone Hcl 50 Mg Tabs (Trazodone hcl) .... Take 1 at night as needed for insomnia 10)  Lopressor 50 Mg Tabs (Metoprolol tartrate) .... Take 1/2 tab two times a day 11)  Vitamin D 1000 Unit Tabs (Cholecalciferol) .... One tab by mouth daily 12)  Toviaz 8 Mg Xr24h-tab (Fesoterodine fumarate) .... Take one tab daily 13)  Mirtazapine 45 Mg Tabs (Mirtazapine) .... Take 1 tab at bedtime 14)  Acetaminophen 650 Mg Cr-tabs (Acetaminophen) .... Take one tab every  6 hours 15)  Tramadol Hcl 50 Mg Tabs (Tramadol hcl) .... 1/2 tab by mouth daily for pain  Other Orders: Influenza Vaccine MCR [id number])  Patient Instructions: 1)  We will continue to work on your neck pain 2)  We will try Tramadol for your pain. 3)  Also go to physical therapy and see if they can help with your neck pain. 4)  Decrease the Trazadone at night to only one tab. 5)  Please schedule a follow up appointment in 4-6 weeks to check on your neck Prescriptions: TRAMADOL HCL 50 MG TABS (TRAMADOL HCL) 1/2 tab by mouth daily for  pain  #30 x 3   Entered and Authorized by:   Candelaria Celeste MD   Signed by:   Candelaria Celeste MD on 02/22/2010   Method used:   Electronically to        [facility] [phone] (retail)       [address]       [place], [place]  [address]       Ph: [phone]       Fax: [phone]   RxID:   [id number]    Orders Added: 1)  Physical Therapy Referral [PT] 2)  Influenza Vaccine MCR [00025] 3)  [place]- Est Level  3 [99213]   Immunizations Administered:  Influenza Vaccine # 1:    Vaccine Type: Fluvax MCR    Site: left deltoid    Mfr: GlaxoSmithKline    Dose: 0.5 ml    Route: IM    Given by: Mauricia Area CMA,    Exp. Date: 10/27/2010    Lot #: [id number]    VIS given: 11/24/09 version given February 22, 2010.  Flu Vaccine Consent Questions:    Do you have a history of severe allergic reactions to this vaccine? no    Any prior history of allergic reactions to egg and/or gelatin? no    Do you have a sensitivity to the preservative Thimersol? no    Do you have a past history of Guillan-Barre Syndrome? no    Do you currently have an acute febrile illness? no    Have you ever had a severe reaction to latex? no    Vaccine information given and explained to patient? yes    Are you currently pregnant? no   Immunizations Administered:  Influenza Vaccine # 1:    Vaccine Type: Fluvax MCR    Site: left deltoid    Mfr: GlaxoSmithKline    Dose: 0.5 ml     Route: IM    Given by: Mauricia Area CMA,    Exp. Date: 10/27/2010    Lot #: [id number]    VIS given: 11/24/09 version given February 22, 2010.     Prevention & Chronic Care Immunizations   Influenza vaccine: Fluvax MCR  (02/22/2010)   Influenza vaccine due: 05/12/2009    Tetanus booster: 05/18/2009: Td   Tetanus booster due: 05/19/2019    Pneumococcal vaccine: Done.  (01/30/1997)   Pneumococcal vaccine deferral: Not indicated  (08/12/2009)   Pneumococcal vaccine due: None    H. zoster vaccine: Not documented  Colorectal Screening   Hemoccult: Done.  (05/02/2004)   Hemoccult due: Not Indicated    Colonoscopy: Done.  (08/30/2004)   Colonoscopy due: 08/31/2014  Other Screening   Pap smear: Done.  (06/02/2000)   Pap smear due: Not Indicated    Mammogram: ASSESSMENT: Negative - BI-RADS 1^MM DIGITAL SCREENING  (09/01/2009)   Mammogram action/deferral: Not indicated  (08/12/2009)   Mammogram due: 08/11/2009    DXA bone density scan: Done.  (06/07/2004)   DXA bone density action/deferral: Not indicated  (12/15/2008)   DXA scan due: None    Smoking status: never  (02/22/2010)  Lipids   Total Cholesterol: 215  (09/21/2007)   LDL: 96  (09/21/2007)   LDL Direct: Not documented   HDL: 57  (09/21/2007)   Triglycerides: 310  (09/21/2007)  Hypertension   Last Blood Pressure: 148 / 61  (02/22/2010)   Serum creatinine: 0.77  (08/12/2009)   Serum potassium 4.2  (08/12/2009)    Hypertension flowsheet reviewed?: Yes   Progress toward  BP goal: Unchanged  Self-Management Support :   Personal Goals (by the next clinic visit) :      Personal blood pressure goal: 140/90  (12/15/2008)   Hypertension self-management support: Not documented  Appended Document: PT eval completed form signed to fax back.    Clinical Lists Changes

## 2010-06-03 NOTE — Assessment & Plan Note (Signed)
Summary: earache,tcb   Vital Signs:  Patient profile:   77 year old female Height:      59.5 inches Weight:      157 pounds Temp:     97.4 degrees F oral Pulse rate:   62 / minute BP sitting:   120 / 67  (left arm) Cuff size:   regular  Vitals Entered By: Schuyler Amor CMA (June 08, 2009 2:39 PM) CC: bilateral ear ache Is Patient Diabetic? No Pain Assessment Patient in pain? yes     Location: ears Intensity: 8   Primary Care Provider:  Eugenie Norrie  MD  CC:  bilateral ear ache.  History of Present Illness: CC: earache, dyspnea  1. earache x 1 mo, bilateral - further elucidated to actually mean bilateral neck pain worse with moving head to sides.  neck pain worse when stretching neck, occasional numbness in hands that resolves on its own.  son does say that patient spends hours sitting and knitting, with head cocked to right.  2. dyspnea - 3-4 mo history of DOE noted more by son (patient doesn't notice this at all).  Especially noted when walking at mall, will walk 30 meters then tires.  Denies chest pain, leg pain, cough, LE swelling, recent viral illness or fever.    3. decreased hearing - noted decreased hearing R > L ears.  At times feels popping but no draining, ringing, itching, no recent pool or lake exposure.  Habits & Providers  Alcohol-Tobacco-Diet     Tobacco Status: never  Current Medications (verified): 1)  Aspirin 81 Mg  Tbec (Aspirin) .... Take One Tablet Daily 2)  Calcium Carbonate - Citracal .... Take One Tablet Bid 3)  Evista 60 Mg  Tabs (Raloxifene Hcl) .... Take One Tablet Daily 4)  Fosamax 70 Mg  Tabs (Alendronate Sodium) .... Take One Tablet Every Week On Tuesday Am 5)  Glucosamine 1200 Mg .... Take One Tablet Bid 6)  Multivitamin .... Take One Tablet Daily 7)  Remeron 45 Mg Tabs (Mirtazapine) .... Take One Tablet By Mouth Q Hs 8)  Synthroid 100 Mcg  Tabs (Levothyroxine Sodium) .... Take One Tablet Daily 9)  Cvs Ibuprofen 200 Mg  Caps  (Ibuprofen) .... Take 2 Tablets Three Times A Day As Needed Pain - Buys This Otc. 10)  Lidoderm 5 %  Ptch (Lidocaine) .Marland Kitchen.. 1 Patch To Skin Once A Day. May Cut Patch and Use 1/4 or 1/2  of Patch Instead. 11)  Fish Oil Concentrate 1000 Mg  Caps (Omega-3 Fatty Acids) .... 2 Tabs Daily 12)  Trazodone Hcl 50 Mg Tabs (Trazodone Hcl) .... Take 1/2 Tablet Q Hs As Needed For Insomnia 13)  Lopressor 50 Mg Tabs (Metoprolol Tartrate) .... Take 1/2 Tab Two Times A Day 14)  Voltaren 1 % Gel (Diclofenac Sodium) .... Rub Two Times A Day On Painful Thumb 15)  Gabapentin 100 Mg Caps (Gabapentin) .... One By Mouth At Bedtime X 1 Wk Then One By Mouth Two Times A Day  Allergies (verified): No Known Drug Allergies  Past History:  Past medical, surgical, family and social histories (including risk factors) reviewed for relevance to current acute and chronic problems.  Past Medical History: Reviewed history from 10/29/2008 and no changes required. Fall from comode with head injury - negative CT.  Depression - Often has multiple physical complaints that are most likely due to depression.  h. pylori + 05/02 h/o restless leg syndrome h/o uterine prolapse and benign ovarian mass MMSE--  28/30 03/2003 Spinal stenosis w/ impingement 01/2005 Abd/pelvic CT - gallstone, DJD, ovarian cyst - 09/30/2004 BMD hip t = -3.0, L/S = -1.2 - 10/30/2001 BMD L hip T = -2.5 and -3.0, + response to tx - 06/02/2004 BMD T-score -1.80 - 06/02/1997 Cardiolite-neg - 05/03/1999 carotid dopplers 6/99 neg colonoscopy - extensive diverticulosis, polyp x 2 - 09/30/2004 Incomplete emptying of the bladder Hospitalized 5/10 for fall thought to be due to [place] per family   head CT w/ contrast - int. carotid aneyrysm - 09/30/2004 L-S xray - spondylosis and listhesis, DJD - 01/17/2005 MRI L/S - spinal stenosis - 02/23/2005 pelvic US - R 4 cm complex cyst - 09/28/2004 Pelvic US - R complex ovarian mass - 01/17/2005 R Knee xray-Medial DJD - 01/01/2003 UGI  normal - 09/13/2000  Past Surgical History: Reviewed history from 12/27/2006 and no changes required. B cataract surgery w/ implants  Hysterectomy & BSO - 04/01/2005 Pelvic Lap - benign ovarian mass - 04/18/2005 r carpal tunnel release  Family History: Reviewed history from 06/29/2006 and no changes required. Dad died in 55`s ?, Mom died at 64 of kidney problems.  Social History: Reviewed history from 10/29/2008 and no changes required. lives with daughter, [place]; No tob, etoh. Has three total children. 1 g'child.; Dtr [name] (w) [phone], (h) [phone]; dtr [place]. Daughters usually interpret for her at visits and are very involved and ask lots of questions.   Physical Exam  General:  Alert, appropriate; well-dressed and well-nourished Appears younger than stated age.  Head:  forward due to kyphosis Ears:  no pain on external exam or abnormality.  L canal and TM clear, good light reflex.  R canal occluded by cerumen - disimpaction performed, pt states hears better.  portion of TM visualized, pearly grey. Lungs:  crackles left base - improved with cough.  normal WOB Heart:  regular rate and rhythm, no murmurs; normal s1/s2, slight JVD, no bruits  Msk:  neck - negative spurling.  limited ROM neck to lateral rotation bilaterally, no midline cervical or paraspinous tenderness.   somewhat tight neck muscles   Impression & Recommendations:  Problem # 1:  CERVICALGIA (ICD-723.1) Seen in geri clinic with Dr. Walker Kehr.  c spine to r/o fracture, eval for facet joint pathology.  treat with hot water bottle, neck pillow, start gabapentin to see if will help with presumed neuropathic pain component.  If doesn't help, consider titrating up (to be on 100mg  BID at f/u) vs trial of baclofen for muscle spasm (only relaxant recommended in geriatrics) vs PT  Her updated medication list for this problem includes:    Aspirin 81 Mg Tbec (Aspirin) .Marland Kitchen... Take one tablet daily    Cvs Ibuprofen 200 Mg Caps  (Ibuprofen) .Marland Kitchen... Take 2 tablets three times a day as needed pain - buys this otc.  Orders: [place]- Est  Level 4 [id number]) Diagnostic X-Ray/Fluoroscopy (Diagnostic X-Ray/Flu)  Problem # 2:  DECREASED HEARING, RIGHT EAR (ICD-389.9)  cerumen impaction.  disimpaction performed today.  hearing improved.  Orders: [place]- Est  Level 4 [id number])  Problem # 3:  DYSPNEA (ICD-786.05) recent labwork including CBC, TSH WNL.  No significant clinical evidence of CHF x slight JVD on exam.  Will send for CXR to further eval this given second clinic visit that crackles LLL heard.  Likely just deconditioning and progressive loss of lung elasticity in elderly. Orders: [place]- Est  Level 4 (99214) CXR- 2view (CXR)  Complete Medication List: 1)  Aspirin 81 Mg Tbec (Aspirin) .... Take  one tablet daily 2)  Calcium Carbonate - Citracal  .... Take one tablet bid 3)  Evista 60 Mg Tabs (Raloxifene hcl) .... Take one tablet daily 4)  Fosamax 70 Mg Tabs (Alendronate sodium) .... Take one tablet every week on tuesday am 5)  Glucosamine 1200 Mg  .... Take one tablet bid 6)  Multivitamin  .... Take one tablet daily 7)  Remeron 45 Mg Tabs (Mirtazapine) .... Take one tablet by mouth q hs 8)  Synthroid 100 Mcg Tabs (Levothyroxine sodium) .... Take one tablet daily 9)  Cvs Ibuprofen 200 Mg Caps (Ibuprofen) .... Take 2 tablets three times a day as needed pain - buys this otc. 10)  Lidoderm 5 % Ptch (Lidocaine) .Marland Kitchen.. 1 patch to skin once a day. may cut patch and use 1/4 or 1/2  of patch instead. 11)  Fish Oil Concentrate 1000 Mg Caps (Omega-3 fatty acids) .... 2 tabs daily 12)  Trazodone Hcl 50 Mg Tabs (Trazodone hcl) .... Take 1/2 tablet q hs as needed for insomnia 13)  Lopressor 50 Mg Tabs (Metoprolol tartrate) .... Take 1/2 tab two times a day 14)  Voltaren 1 % Gel (Diclofenac sodium) .... Rub two times a day on painful thumb 15)  Gabapentin 100 Mg Caps (Gabapentin) .... One by mouth at bedtime x 1 wk then one by mouth two times a  day  Patient Instructions: 1)  Le vamos a mandar para rayos x de el cuello por su dolor y los pulmones por le dificultad de Ambulance person. 2)  Hoy le hemos limpiado el oido derecho. 3)  Trate medicina para dolor llamada Gabapentin - comenzaremos dosis baja y podremos subir como sea necesario.  Empieze con 100mg  por la noche, y despues de 1 semana, suba a 100mg  dos veces al dia.  Esta medicina ayudara dolor de nervios. 4)  Llame a clinica con preguntas.  Gusto conocerla hoy. Prescriptions: GABAPENTIN 100 MG CAPS (GABAPENTIN) one by mouth at bedtime x 1 wk then one by mouth two times a day  #60 x 0   Entered by:   Ria Bush  MD   Authorized by:   Candelaria Celeste MD   Signed by:   Ria Bush  MD on 06/08/2009   Method used:   Electronically to        [facility] [phone]* (retail)       [address]       [place], [place]  [address]       Ph: [phone]       Fax: [phone]   RxID:   [id number]

## 2010-06-03 NOTE — Assessment & Plan Note (Signed)
Summary: f/u,df   Vital Signs:  Patient profile:   76 year old female Height:      59.5 inches Weight:      158 pounds Temp:     98.2 degrees F Pulse rate:   67 / minute BP sitting:   123 / 62  Vitals Entered By: Christen Bame CMA (August 12, 2009 1:56 PM) CC: f/u Is Patient Diabetic? No   Primary Care Provider:  Eugenie Norrie  MD  CC:  f/u.  History of Present Illness: Pt is [place]. Brings daughter in to interpret.   1. neck pain Seen for cervicalgia in early March. Neurontin increased to 200 mg two times a day however pt felt like it made her dizzy so went back to one by mouth two times a day. States that her pain is on both sides of her neck and associated with some numbness of both hands.   2. ear problem Was prescribed ciprodex by Dr. Walker Kehr. States that this didn't help. She states there isn't any pain but she notes a "pop" 5-6 times per day.  3. leg cramps Has had leg cramps for years.  Worse over the past few weeks. Has previously been on quinine per family's report. Labs check in Jan and Hgb, lytes, Ca all normal. Drinks about 5-6 glasses of water per day. Takes only gabapentin and ibuprofen for pain.   no chest pain, problems breathing. Eating well. No nausea, vomting, diarrhea. No problems with urination.   Current Medications (verified): 1)  Aspirin 81 Mg  Tbec (Aspirin) .... Take One Tablet Daily 2)  Calcium Carbonate - Citracal .... Take One Tablet Bid 3)  Evista 60 Mg  Tabs (Raloxifene Hcl) .... Take One Tablet Daily 4)  Fosamax 70 Mg  Tabs (Alendronate Sodium) .... Take One Tablet Every Week On Tuesday Am 5)  Glucosamine 1200 Mg .... Take One Tablet Bid 6)  Multivitamin .... Take One Tablet Daily 7)  Remeron 45 Mg Tabs (Mirtazapine) .... Take One Tablet By Mouth Q Hs 8)  Synthroid 100 Mcg  Tabs (Levothyroxine Sodium) .... Take One Tablet Daily 9)  Cvs Ibuprofen 200 Mg  Caps (Ibuprofen) .... Take 2 Tablets Three Times A Day As Needed Pain - Buys This  Otc. 10)  Lidoderm 5 %  Ptch (Lidocaine) .Marland Kitchen.. 1 Patch To Skin Once A Day. May Cut Patch and Use 1/4 or 1/2  of Patch Instead. 11)  Fish Oil Concentrate 1000 Mg  Caps (Omega-3 Fatty Acids) .... 2 Tabs Daily 12)  Trazodone Hcl 50 Mg Tabs (Trazodone Hcl) .... Take 1/2 Tablet Q Hs As Needed For Insomnia 13)  Lopressor 50 Mg Tabs (Metoprolol Tartrate) .... Take 1/2 Tab Two Times A Day 14)  Voltaren 1 % Gel (Diclofenac Sodium) .... Rub Two Times A Day On Painful Thumb 15)  Gabapentin 100 Mg Caps (Gabapentin) .... Two By Mouth Two Times A Day 16)  Debrox 6.5 % Soln (Carbamide Peroxide) .... 4-5 Drops in Affected Ear Two Times A Day (Label in Spanish)  Allergies (verified): No Known Drug Allergies  Review of Systems       review of systems as noted in HPI section   Physical Exam  General:  VS reviewed and normal.  Alert, appropriate; well-dressed and well-nourished Appears younger than stated age.  Ears:  L ear: normal canal. TM normal. no erythema, drainage, or discharge   R ear: blocked by cerumen. Lungs:  work of breathing unlabored, clear to auscultation bilaterally; no wheezes, rales,  or ronchi; good air movement throughout  Heart:  regular rate and rhythm, no murmurs; normal s1/s2  Msk:  Some tendenessr to palpation paracervical areas bilaterally; no obvious spasm. Spurling's negative bilaterally. 4+/5 grip strength bilaterally. Some pain with lateral rotation bilaterally but largely perserved ROM.  Extremities:  no cyanosis, clubbing, or edema; no pain with palpation of the calves bilaterally. no erythema or warmth to palpation.   Impression & Recommendations:  Problem # 1:  CERVICALGIA (ICD-723.1)  schedule tylenol. continue neurontin. Will not start other meds that may alter mentation or increase risk of falls.  cevical films 2/11: Degenerative spondylosis described above.  Subluxation of C5 on C6.  Her updated medication list for this problem includes:    Aspirin 81 Mg Tbec  (Aspirin) .Marland Kitchen... Take one tablet daily    Cvs Ibuprofen 200 Mg Caps (Ibuprofen) .Marland Kitchen... Take 2 tablets three times a day as needed pain - buys this otc.  Orders: [facility]- Est  Level 4 [id number])  Problem # 2:  EAR PAIN, REFERRED (ICD-388.72)  popping likely related to cerumen impaction. This was washed out today.  Orders: [facility]- Est  Level 4 [id number])  Problem # 3:  LEG CRAMPS (ICD-729.82) check lytes. Advised better hydration. Again, would like to avoid meds that may cloud mentation or predispose to falls. Orders: [facility] [id number]) [facility]- Est  Level 4 [id number])  Complete Medication List: 1)  Aspirin 81 Mg Tbec (Aspirin) .... Take one tablet daily 2)  Calcium Carbonate - Citracal  .... Take one tablet bid 3)  Evista 60 Mg Tabs (Raloxifene hcl) .... Take one tablet daily 4)  Fosamax 70 Mg Tabs (Alendronate sodium) .... Take one tablet every week on tuesday am 5)  Glucosamine 1200 Mg  .... Take one tablet bid 6)  Multivitamin  .... Take one tablet daily 7)  Remeron 45 Mg Tabs (Mirtazapine) .... Take one tablet by mouth q hs 8)  Synthroid 100 Mcg Tabs (Levothyroxine sodium) .... Take one tablet daily 9)  Cvs Ibuprofen 200 Mg Caps (Ibuprofen) .... Take 2 tablets three times a day as needed pain - buys this otc. 10)  Lidoderm 5 % Ptch (Lidocaine) .Marland Kitchen.. 1 patch to skin once a day. may cut patch and use 1/4 or 1/2  of patch instead. 11)  Fish Oil Concentrate 1000 Mg Caps (Omega-3 fatty acids) .... 2 tabs daily 12)  Trazodone Hcl 50 Mg Tabs (Trazodone hcl) .... Take 1/2 tablet q hs as needed for insomnia 13)  Lopressor 50 Mg Tabs (Metoprolol tartrate) .... Take 1/2 tab two times a day 14)  Voltaren 1 % Gel (Diclofenac sodium) .... Rub two times a day on painful thumb 15)  Gabapentin 100 Mg Caps (Gabapentin) .... Two by mouth two times a day 16)  Debrox 6.5 % Soln (Carbamide peroxide) .... 4-5 drops in affected ear two times a day (label in spanish)  Patient Instructions: 1)  for your legs, make  sure you're drinking lots of water -- you should be drinking at least 8 glasses every day. We'll check your electrolytes today to make sure nothing is off. 2)  for you neck pain -- start taking tylenol 500 mg every 6 hours -- whether you hurt or not. Do this for two weeks, then change back to as needed. It may be that you need to take this once or twice a day regularly. 3)  for the shingles vaccine, you should get it from a pharmacy that carries it. 4)  follow-up in 3  months.   Primary Care Provider:  Eugenie Norrie  MD  CC:  f/u.  History of Present Illness: Pt is [place]. Brings daughter in to interpret.   1. neck pain Seen for cervicalgia in early March. Neurontin increased to 200 mg two times a day however pt felt like it made her dizzy so went back to one by mouth two times a day. States that her pain is on both sides of her neck and associated with some numbness of both hands.   2. ear problem Was prescribed ciprodex by Dr. Walker Kehr. States that this didn't help. She states there isn't any pain but she notes a "pop" 5-6 times per day.  3. leg cramps Has had leg cramps for years.  Worse over the past few weeks. Has previously been on quinine per family's report. Labs check in Jan and Hgb, lytes, Ca all normal. Drinks about 5-6 glasses of water per day. Takes only gabapentin and ibuprofen for pain.   no chest pain, problems breathing. Eating well. No nausea, vomting, diarrhea. No problems with urination.     Prevention & Chronic Care Immunizations   Influenza vaccine: refused  (05/12/2008)   Influenza vaccine due: 05/12/2009    Tetanus booster: 05/18/2009: Td   Tetanus booster due: 05/19/2019    Pneumococcal vaccine: Done.  (01/30/1997)   Pneumococcal vaccine deferral: Not indicated  (08/12/2009)   Pneumococcal vaccine due: None    H. zoster vaccine: Not documented  Colorectal Screening   Hemoccult: Done.  (05/02/2004)   Hemoccult due: Not Indicated    Colonoscopy: Done.   (08/30/2004)   Colonoscopy due: 08/31/2014  Other Screening   Pap smear: Done.  (06/02/2000)   Pap smear due: Not Indicated    Mammogram: ASSESSMENT: Negative - BI-RADS 1^MM DIGITAL SCREENING  (08/11/2008)   Mammogram action/deferral: Not indicated  (08/12/2009)   Mammogram due: 08/11/2009    DXA bone density scan: Done.  (06/07/2004)   DXA bone density action/deferral: Not indicated  (12/15/2008)   DXA scan due: None    Smoking status: never  (06/08/2009)  Lipids   Total Cholesterol: 215  (09/21/2007)   LDL: 96  (09/21/2007)   LDL Direct: Not documented   HDL: 57  (09/21/2007)   Triglycerides: 310  (09/21/2007)  Hypertension   Last Blood Pressure: 123 / 62  (08/12/2009)   Serum creatinine: 0.80  (05/18/2009)   Serum potassium 4.0  (05/18/2009)    Hypertension flowsheet reviewed?: Yes   Progress toward BP goal: At goal  Self-Management Support :   Personal Goals (by the next clinic visit) :      Personal blood pressure goal: 140/90  (12/15/2008)   Hypertension self-management support: Not documented

## 2010-06-03 NOTE — Letter (Signed)
Summary: Results Follow-up Letter  [facility]  [address]   [place], [place] [address]   Phone: [phone]  Fax: [phone]    04/05/2010  [place], [place]  [id number]  Dear Ms. Krol,   The following are the results of your recent test(s):  Patient: Jordan [name]  Tests: (1) CMP with Estimated GFR (2402)   Sodium                    140 mEq/L                   135-145   Potassium                 4.2 mEq/L                   3.5-5.3   Chloride                  100 mEq/L                   96-112   CO2                       29 mEq/L                    19-32   Glucose                   78 mg/dL                    70-99   BUN                       20 mg/dL                    6-23   Creatinine                0.92 mg/dL                  0.40-1.20   Bilirubin, Total          0.3 mg/dL                   0.3-1.2   Alkaline Phosphatase      60 U/L                      39-117   AST/SGOT                  21 U/L                      0-37   ALT/SGPT                  19 U/L                      0-35   Total Protein             6.7 g/dL                    6.0-8.3   Albumin                   3.7 g/dL                    3.5-5.2  Calcium                   9.4 mg/dL                   8.4-10.5 ! Est GFR, African American                             >60 mL/min                  >60 ! Est GFR, NonAfrican American                        [L]  58 mL/min                   >60 Kidney and liver function is normal Tests: (2) CBC NO Diff (Complete Blood Count) (10000)   WBC                       8.9 K/uL                    4.0-10.5   RBC                       4.05 MIL/uL                 3.87-5.11   Hemoglobin                13.4 g/dL                   12.0-15.0   Hematocrit                40.9 %                      36.0-46.0   MCV                  [H]  101.0 fL                    78.0-100.0 ! MCH                       33.1 pg                     26.0-34.0   MCHC                       32.8 g/dL                   30.0-36.0   RDW                       12.4 %                      11.5-15.5   Platelet Count            196 K/uL                    150-400 No anemia Tests: (3) Sed Rate (ESR) (15010)   Sed Rate (ESR)       [H]  27 mm/hr                    0-22 Inflammation is not  high Tests: (4) TSH (23280)   TSH                  [H]  4.570 uIU/mL                0.350-4.500 Thyroid function is normal Tests: (5) Vitamin B12 [id number])   Vitamin B12               788 pg/mL                   211-911 This vitamin level is normal MR C SPINE W/O CM - [id number]   Clinical Data: Neck pain.  Cervicalgia.   MRI CERVICAL SPINE WITHOUT CONTRAST   Technique:  Multiplanar and multiecho pulse sequences of the cervical spine, to include the craniocervical junction and cervicothoracic junction, were obtained according to standard protocol without intravenous contrast.   Comparison: Cervical spine radiographs 06/10/2009.   Findings: Normal signal is present in the cervical and upper thoracic spinal cord to the lowest imaged level, T3-4.  The craniocervical junction is within normal limits.  Mild cerebellar atrophy is noted.  The posterior fossa structures are otherwise within normal limits.  Flow is present in the major vascular structures of the neck.  Grade 1 anterolisthesis at C5-6 measures 4.5 mm.  There is slight retrolisthesis of C6-7.  End plate marrow changes are present at both levels.  Alignment and marrow signal is otherwise within normal limits.  A soft tissue nodule of the right lung apex measures at least 2 cm.  While this may represent scar tissue, neoplasm is not excluded.  CT of the chest could be used for further evaluation if clinically indicated.  The individual disc levels are as follows.   C2-3:  Mild facet hypertrophy is worse on the right.  Mild right foraminal narrowing is present.   C3-4:  Mild disc bulging is noted.  Facet hypertrophy is worse  on the left.  Moderate left foraminal narrowing is present.   C4-5:  A broad-based disc osteophyte complex is present.  Facet hypertrophy is noted bilaterally.  Mild central and bilateral foraminal narrowing is present.   C5-6:  Degenerative anterolisthesis results in severe right foraminal stenosis.  Moderate right central canal stenosis is also present with distortion of the spinal cord and narrowing of the canal to 9 mm.  Mild left foraminal narrowing is present.   C6-7:  A broad-based disc osteophyte complex is present.  Moderate central and bilateral foraminal narrowing is present, worse on the right.   C7-T1:  Negative.   IMPRESSION:   1.  4.5 mm degenerative anterolisthesis at C5-6 results in severe right foraminal stenosis. 2.  Moderate central canal stenosis at C5-6 is worse on the right. 3.  Mild left foraminal narrowing C5-6. The spinal cord and nerves coming out of it are being pinched on several levels.   4.  Moderate central and bilateral foraminal stenosis at C6-7, worse on the right. 5.  Mild right foraminal narrowing at C2-3. 6.  Moderate left foraminal stenosis at C3-4. 7.  Mild central and bilateral foraminal narrowing and C4-5. 8.  2 cm nodule at the right lung apex.  While this may represent scarring, neoplasm is not excluded. The radiologist recommended this area be CT scanned, although he didn't think that it looked like a cancer growth.  Read By:  Arna Snipe,  MD     Released By:  Arna Snipe,  MD  Document Creation Date: 03/30/2010  1:34 AM _______________________________________________________________________  Sincerely,  Candelaria Celeste MD [facility]            Appended Document: Results Follow-up Letter faxed and mailed

## 2010-06-03 NOTE — Consult Note (Signed)
Summary: [facility]   Imported By: Raymond Gurney 11/05/2009 15:56:31  _____________________________________________________________________  External Attachment:    Type:   Image     Comment:   External Document

## 2010-06-03 NOTE — Progress Notes (Signed)
Summary: phn msg  Phone Note Call from Patient Call back at [phone]   Caller: Earna Coder Summary of Call: Wondering if she can talk to Dr. Sarita Haver could talk to her concerning her mother. Initial call taken by: Raymond Gurney,  Aug 31, 2009 10:50 AM  Follow-up for Phone Call        Dtr reports that scheduled tylenol hasn't helped (has been taking for 2 weeks). Wondering about anti-inflammatory shots and muscle relaxers. given her age, I would be willing to prescribe muscle relaxer only if family were able to observe her to see how it affected her. To come in tomorrow morning in the work-in clinic for toradol and to discuss further.  Follow-up by: Eugenie Norrie  MD,  Aug 31, 2009 11:52 AM

## 2010-06-03 NOTE — Progress Notes (Signed)
Summary: Lung CT lesion just scar  Phone Note Call from Patient   Caller: Daughter Call For: [phone] Summary of Call: Want to speak with nurse regarding results from CT scan.   Do not want any correspondence sent to mother if results are not negative.  Would prefer to have a consult with family members regarding that first.  Follow-up for Phone Call        I spoke with her daughter and informed her that the lung lesion appears to be scarring that was present as far back as 2006 Follow-up by: Candelaria Celeste MD,  April 20, 2010 6:21 PM

## 2010-06-03 NOTE — Assessment & Plan Note (Signed)
Summary: swollen thumb and neck pain   Vital Signs:  Patient profile:   77 year old female Height:      59.5 inches Weight:      155 pounds Temp:     97.4 degrees F oral BP sitting:   140 / 66  (left arm) Cuff size:   regular  Vitals Entered By: Schuyler Amor CMA (May 18, 2009 3:20 PM)  CC: fell and hurt hand Is Patient Diabetic? No Pain Assessment Patient in pain? yes     Location: hand Intensity: 8   Primary Care Provider:  Eugenie Norrie  MD  CC:  fell and hurt hand.  History of Present Illness: Sra Sheen hurt her right thumb when pulling our a drawer and tried to sit on the bed that wasn't close enough behind her. No dizzines or LOC She threw out her right hand which struck a desk and she's had pain in the lateral first MP joint since, particularly if she bumps it on something. She can write and grip a door knob. Doesnt' seem to relate to her prior Carpal Tunnel surgery years ago on that side.   Gets pain in her neck behind her right ear, that her son says comes after sittling for long periods working on hobbies with her head cocked to the right side. Occ numbness in the hands, but not positional. No neck pain at night.   Depression and insomnia better on her current medications.  blood pressure is usually elevated when she first visits the doctor  Habits & Providers  Alcohol-Tobacco-Diet     Tobacco Status: never  Current Medications (verified): 1)  Aspirin 81 Mg  Tbec (Aspirin) .... Take One Tablet Daily 2)  Calcium Carbonate - Citracal .... Take One Tablet Bid 3)  Evista 60 Mg  Tabs (Raloxifene Hcl) .... Take One Tablet Daily 4)  Fosamax 70 Mg  Tabs (Alendronate Sodium) .... Take One Tablet Every Week On Tuesday Am 5)  Glucosamine 1200 Mg .... Take One Tablet Bid 6)  Multivitamin .... Take One Tablet Daily 7)  Remeron 45 Mg Tabs (Mirtazapine) .... Take One Tablet By Mouth Q Hs 8)  Synthroid 100 Mcg  Tabs (Levothyroxine Sodium) .... Take One Tablet Daily 9)   Tylenol 650 Mg .... Take One Tablet By Mouth Three Times A Day 10)  Cvs Ibuprofen 200 Mg  Caps (Ibuprofen) .... Take 2 Tablets Three Times A Day As Needed Pain - Buys This Otc. 11)  Lidoderm 5 %  Ptch (Lidocaine) .Marland Kitchen.. 1 Patch To Skin Once A Day. May Cut Patch and Use 1/4 or 1/2  of Patch Instead. 12)  Fish Oil Concentrate 1000 Mg  Caps (Omega-3 Fatty Acids) .... 2 Tabs Daily 13)  Trazodone Hcl 50 Mg Tabs (Trazodone Hcl) .... Take 1/2 Tablet Q Hs As Needed For Insomnia 14)  Lopressor 50 Mg Tabs (Metoprolol Tartrate) .... Take 1/2 Tab Two Times A Day 15)  Voltaren 1 % Gel (Diclofenac Sodium) .... Rub Two Times A Day On Painful Thumb  Allergies (verified): No Known Drug Allergies  Physical Exam  General:  Alert, appropriate; well-dressed and well-nourished Appears younger than stated age.  Head:  forward due to kyphosis Ears:  no external deformities.  No swelling or pain to touch behind right ear.  Neck:  no JVD. no thyromegaly. Decrease range of motion, but pain not reproduced on movement.  Lungs:  crackles left base Heart:  regular rate and rhythm, no murmurs; normal s1/s2  Extremities:  no cyanosis, clubbing, or edema  right first MP joint is enlarged and firm about twice size of left, with tenderness over the lateral joint and pain with stretching that area. No warmth or redness. Hand motion is minimally limited and good strength on apposition. Heberdens nodes both hands.  Psych:  alert and oriented. full affect, normally interactive. Good eye contact.    Impression & Recommendations:  Problem # 1:  THUMB PAIN, RIGHT (ICD-729.5) Lateral collateral ligament injury with exacerbation of degenerative joint disease "jammed thumb" Orders: Splint wrist/thumb- [facility] [id number]) voltaren gel two times a day   Problem # 2:  ACCIDENTAL FALL, HX OF (ICD-V15.89) Different from her prior fall which was blamed on Ambien. Would check her balance and gait next visit Orders: [place]- Est  Level 4  [id number])  Problem # 3:  HYPOTHYROIDISM, UNSPECIFIED (ICD-244.9)  Her updated medication list for this problem includes:    Synthroid 100 Mcg Tabs (Levothyroxine sodium) .Marland Kitchen... Take one tablet daily  Orders: [facility]- Est  Level 4 ([address]214) TSH-[facility] [id number]) LH-[facility] [id number])  Problem # 4:  HYPERTENSION, BENIGN SYSTEMIC (ICD-401.1) normal on recheck. Avoid salt and walk regularly Her updated medication list for this problem includes:    Lopressor 50 Mg Tabs (Metoprolol tartrate) .Marland Kitchen... Take 1/2 tab two times a day  Orders: [facility]- Est  Level 4 ([address]214) Comp Met-[facility] [id number])  Problem # 5:  CERVICALGIA (ICD-723.1) believe this is positional spasm due to degenerative joint disease. Emphasized maintaining normal posture sitting.  Her updated medication list for this problem includes:    Aspirin 81 Mg Tbec (Aspirin) .Marland Kitchen... Take one tablet daily    Cvs Ibuprofen 200 Mg Caps (Ibuprofen) .Marland Kitchen... Take 2 tablets three times a day as needed pain - buys this otc.  Complete Medication List: 1)  Aspirin 81 Mg Tbec (Aspirin) .... Take one tablet daily 2)  Calcium Carbonate - Citracal  .... Take one tablet bid 3)  Evista 60 Mg Tabs (Raloxifene hcl) .... Take one tablet daily 4)  Fosamax 70 Mg Tabs (Alendronate sodium) .... Take one tablet every week on tuesday am 5)  Glucosamine 1200 Mg  .... Take one tablet bid 6)  Multivitamin  .... Take one tablet daily 7)  Remeron 45 Mg Tabs (Mirtazapine) .... Take one tablet by mouth q hs 8)  Synthroid 100 Mcg Tabs (Levothyroxine sodium) .... Take one tablet daily 9)  Tylenol 650 Mg  .... Take one tablet by mouth three times a day 10)  Cvs Ibuprofen 200 Mg Caps (Ibuprofen) .... Take 2 tablets three times a day as needed pain - buys this otc. 11)  Lidoderm 5 % Ptch (Lidocaine) .Marland Kitchen.. 1 patch to skin once a day. may cut patch and use 1/4 or 1/2  of patch instead. 12)  Fish Oil Concentrate 1000 Mg Caps (Omega-3 fatty acids) .... 2 tabs daily 13)  Trazodone Hcl 50  Mg Tabs (Trazodone hcl) .... Take 1/2 tablet q hs as needed for insomnia 14)  Lopressor 50 Mg Tabs (Metoprolol tartrate) .... Take 1/2 tab two times a day 15)  Voltaren 1 % Gel (Diclofenac sodium) .... Rub two times a day on painful thumb  Other Orders: CBC-[facility] [id number]) Vit D, 25 OH-[facility] [id number]) TD Toxoids IM 7 YR + [id number]) Admin 1st Vaccine [id number])  Patient Instructions: 1)  Please schedule a follow-up appointment in 2 weeks.  2)  We will call if any problems with your lab tests.  Prescriptions: VOLTAREN 1 % GEL (DICLOFENAC SODIUM) Rub two times a  day on painful thumb  #100 gm tube x 3   Entered by:   Eugenie Norrie  MD   Authorized by:   Candelaria Celeste MD   Signed by:   Eugenie Norrie  MD on 05/18/2009   Method used:   Electronically to        [facility] [phone]* (retail)       [address]       [place], [place]  [address]       Ph: [phone]       Fax: [phone]   RxID:   [id number] TRAZODONE HCL 50 MG TABS (TRAZODONE HCL) Take 1/2 tablet q hs as needed for insomnia  #30 x 6   Entered by:   Eugenie Norrie  MD   Authorized by:   Candelaria Celeste MD   Signed by:   Eugenie Norrie  MD on 05/18/2009   Method used:   Electronically to        [facility] [phone]* (retail)       [address]       [place], [place]  [address]       Ph: [phone]       Fax: [phone]   RxID:   [id number] LOPRESSOR 50 MG TABS (METOPROLOL TARTRATE) Take 1/2 tab two times a day  #60 x 0   Entered by:   Eugenie Norrie  MD   Authorized by:   Candelaria Celeste MD   Signed by:   Eugenie Norrie  MD on 05/18/2009   Method used:   Historical   RxID:   [id number]    Prevention & Chronic Care Immunizations   Influenza vaccine: refused  (05/12/2008)   Influenza vaccine due: 05/12/2009    Tetanus booster: 05/18/2009: Td   Tetanus booster due: 03/02/2009    Pneumococcal vaccine: Done.  (01/30/1997)   Pneumococcal vaccine due: None    H. zoster vaccine: Not documented  Colorectal  Screening   Hemoccult: Done.  (05/02/2004)   Hemoccult due: Not Indicated    Colonoscopy: Done.  (08/30/2004)   Colonoscopy due: 08/31/2014  Other Screening   Pap smear: Done.  (06/02/2000)   Pap smear due: Not Indicated    Mammogram: ASSESSMENT: Negative - BI-RADS 1^MM DIGITAL SCREENING  (08/11/2008)   Mammogram due: 08/11/2009    DXA bone density scan: Done.  (06/07/2004)   DXA bone density action/deferral: Not indicated  (12/15/2008)   DXA scan due: None    Smoking status: never  (05/18/2009)  Lipids   Total Cholesterol: 215  (09/21/2007)   LDL: 96  (09/21/2007)   LDL Direct: Not documented   HDL: 57  (09/21/2007)   Triglycerides: 310  (09/21/2007)  Hypertension   Last Blood Pressure: 140 / 66  (05/18/2009)   Serum creatinine: 0.89  (03/20/2008)   Serum potassium 4.3  (03/20/2008) CMP ordered     Hypertension flowsheet reviewed?: Yes   Progress toward BP goal: Unchanged  Self-Management Support :   Personal Goals (by the next clinic visit) :      Personal blood pressure goal: 140/90  (12/15/2008)   Patient will work on the following items until the next clinic visit to reach self-care goals:     Eating: eat foods that are low in salt  (05/18/2009)     Activity: take a 30 minute walk every day  (05/18/2009)    Hypertension self-management support: Not documented    Immunizations Administered:  Tetanus Vaccine:    Vaccine  Type: Td    Site: right deltoid    Mfr: [place]    Dose: 0.5 ml    Route: IM    Given by: Christen Bame CMA    Exp. Date: 02/24/2011    Lot #: [id number]    VIS given: 03/20/07 version given May 18, 2009.

## 2010-06-03 NOTE — Progress Notes (Signed)
Summary: triage  Phone Note Call from Patient Call back at [phone]   Caller: Daughter-Mary York Cerise Summary of Call: Having pain in neck area and some nose bleeding. Initial call taken by: Raymond Gurney,  December 14, 2009 10:17 AM  Follow-up for Phone Call        the above number is not correct. a man answered & confirmed the number but does not have this pt there Follow-up by: Elige Radon RN,  December 14, 2009 10:30 AM  Additional Follow-up for Phone Call Additional follow up Details #1::        I called & reached dtr. appt tomorrow with pcp. not sleping well due to neck pain. wants to increASE PM MEDS. TO DISCUSS WITH MD. DTR DOES NOT WANT A TRANSLATOR. SHE WILL INTERPRET Additional Follow-up by: Elige Radon RN,  December 15, 2009 4:19 PM

## 2010-06-03 NOTE — Assessment & Plan Note (Signed)
Summary: F/U VISIT/[facility]   Vital Signs:  Patient profile:   77 year old female Height:      59.5 inches Weight:      159 pounds BMI:     31.69 BSA:     1.68 Temp:     98.7 degrees F Pulse rate:   61 / minute BP sitting:   130 / 55  Vitals Entered By: Christen Bame CMA (Sep 21, 2009 1:53 PM) CC: f/u Is Patient Diabetic? No Pain Assessment Patient in pain? no        Primary Care Provider:  Eugenie Norrie  MD  CC:  f/u.  History of Present Illness: dtr here interpreting for entire interview and exam.  1. cervicalgia  Much improved with increase in neurontin and addition of celebrex. Could only increase neurontin to 100 mg three times a day due to increasing dizziness. Is content with her current pain level and states that it is quite acceptable.   2. sleep  Having trouble falling asleep. Taking trazodone 25 mg at bedtime. Goes to bed at about 11 or 12 at night. Takes the remeron and trazodone right as she goes to bed. Does not watch TV or read in bed. Wakes up about 9 am usually.  3. frequent urination  States that she has had a history of frequent UTIs and on prophylactic antibiotics at one time. Saw a urologist about one year ago. Goes to void about twice per night. Feels like she urinates more often than she should. Feels like she empties her bladder completely. Saw Dr. Jeffie Pollock at [facility]. Last note we have is from 12/09. had a cystoscopy done at that time and diagnosed with chronic cystitis and interstitial cystitis. Had stress incontinence at that time as well.  Denies any dysuria currently.  Habits & Providers  Alcohol-Tobacco-Diet     Tobacco Status: never  Current Medications (verified): 1)  Aspirin 81 Mg  Tbec (Aspirin) .... Take One Tablet Daily 2)  Calcium Carbonate - Citracal .... Take One Tablet Bid 3)  Evista 60 Mg  Tabs (Raloxifene Hcl) .... Take One Tablet Daily 4)  Fosamax 70 Mg  Tabs (Alendronate Sodium) .... Take One Tablet Every Week On Tuesday  Am 5)  Glucosamine 1200 Mg .... Take One Tablet Bid 6)  Multivitamin .... Take One Tablet Daily 7)  Remeron 45 Mg Tabs (Mirtazapine) .... Take One Tablet By Mouth Q Hs 8)  Synthroid 100 Mcg  Tabs (Levothyroxine Sodium) .... Take One Tablet Daily 9)  Lidoderm 5 %  Ptch (Lidocaine) .Marland Kitchen.. 1 Patch To Skin Once A Day. May Cut Patch and Use 1/4 or 1/2  of Patch Instead. 10)  Fish Oil Concentrate 1000 Mg  Caps (Omega-3 Fatty Acids) .... 2 Tabs Daily 11)  Trazodone Hcl 50 Mg Tabs (Trazodone Hcl) .... Take 1/2 Tablet Q Hs As Needed For Insomnia 12)  Lopressor 50 Mg Tabs (Metoprolol Tartrate) .... Take 1/2 Tab Two Times A Day 13)  Voltaren 1 % Gel (Diclofenac Sodium) .... Rub Two Times A Day On Painful Thumb 14)  Gabapentin 100 Mg Caps (Gabapentin) .... Take One Tablet Three Times A Day 15)  Debrox 6.5 % Soln (Carbamide Peroxide) .... 4-5 Drops in Affected Ear Two Times A Day (Label in Spanish) 16)  Celebrex 100 Mg Caps (Celecoxib) .... One By Mouth Two Times A Day  Allergies (verified): No Known Drug Allergies  Past History:  Past Medical History: Fall from comode with head injury - negative CT.  Depression -  Often has multiple physical complaints that are most likely due to depression.  h. pylori + 05/02 h/o restless leg syndrome h/o uterine prolapse and benign ovarian mass MMSE--  28/30 03/2003 Spinal stenosis w/ impingement 01/2005 Abd/pelvic CT - gallstone, DJD, ovarian cyst - 09/30/2004 BMD hip t = -3.0, L/S = -1.2 - 10/30/2001 BMD L hip T = -2.5 and -3.0, + response to tx - 06/02/2004 BMD T-score -1.80 - 06/02/1997 Cardiolite-neg - 05/03/1999 carotid dopplers 6/99 neg colonoscopy - extensive diverticulosis, polyp x 2 - 09/30/2004 Incomplete emptying of the bladder Hospitalized 5/10 for fall thought to be due to [place] per family   head CT w/ contrast - int. carotid aneyrysm - 09/30/2004 L-S xray - spondylosis and listhesis, DJD - 01/17/2005 MRI L/S - spinal stenosis - 02/23/2005 pelvic US - R 4  cm complex cyst - 09/28/2004 Pelvic US - R complex ovarian mass - 01/17/2005 R Knee xray-Medial DJD - 01/01/2003 UGI normal - 09/13/2000 CXR COPD, CM and vascular congestion 06/2009 Cervical spine DDD, subluxation C5 on C6 06/2009   chronic interstitial cystitis -- cystocopy 12/09 by Dr. Jeffie Pollock.  Review of Systems       Positive for occasional dizziness upon standing. 10-point review of systems is otherwise negative except as noted in HPI.    Physical Exam  General:  vital signs reviewed and normal Alert, appropriate; well-dressed and well-nourished  Neurologic:  alert and oriented. speech normal. station and gait normal. no gross deficitis. Smooth transfers without assistance.  Psych:  alert and oriented. full affect, normally interactive. Good eye contact.    Impression & Recommendations:  Problem # 1:  CERVICALGIA (W9453499.1) Assessment Improved  continue neurontin 100 mg three times a day and celebrex. Asked pt to decrease celebrex to once per day after 2 weeks to decrease risk of complications.  Her updated medication list for this problem includes:    Aspirin 81 Mg Tbec (Aspirin) .Marland Kitchen... Take one tablet daily    Celebrex 100 Mg Caps (Celecoxib) ..... One by mouth two times a day  Orders: [place]- Est  Level 4 [id number])  Problem # 2:  INSOMNIA NOS (ICD-780.52) Assessment: Deteriorated  take trazodone and remeron earlier per instructions. May increase trazodone to 50 mg at bedtime if this proves ineffective.   Orders: [place]- Est  Level 4 [id number])  Problem # 3:  INTERSTITIAL CYSTITIS (C9250656.1) Assessment: Unchanged per Dr. Jeffie Pollock. Will refer pt back to him although it does not seem that her symptoms have been progressive. Not currently on any urinary antispasmodics.  Orders: Urology Referral (Urology) [facility]- Est  Level 4 [id number])  Complete Medication List: 1)  Aspirin 81 Mg Tbec (Aspirin) .... Take one tablet daily 2)  Caltrate 600+d 600-400 Mg-unit Tabs (Calcium carbonate-vitamin d)  .... One by mouth two times a day 3)  Evista 60 Mg Tabs (Raloxifene hcl) .... Take one tablet daily 4)  Fosamax 70 Mg Tabs (Alendronate sodium) .... Take one tablet every week on tuesday am 5)  Glucosamine 1200 Mg  .... Take one tablet bid 6)  Multivitamin  .... Take one tablet daily 7)  Remeron 45 Mg Tabs (Mirtazapine) .... Take one tablet by mouth q hs 8)  Synthroid 100 Mcg Tabs (Levothyroxine sodium) .... Take one tablet daily 9)  Lidoderm 5 % Ptch (Lidocaine) .Marland Kitchen.. 1 patch to skin once a day. may cut patch and use 1/4 or 1/2  of patch instead. 10)  Fish Oil Concentrate 1000 Mg Caps (Omega-3 fatty acids) .... 2 tabs daily  11)  Trazodone Hcl 50 Mg Tabs (Trazodone hcl) .... Take 1/2 tablet q hs as needed for insomnia 12)  Lopressor 50 Mg Tabs (Metoprolol tartrate) .... Take 1/2 tab two times a day 13)  Voltaren 1 % Gel (Diclofenac sodium) .... Rub two times a day on painful thumb 14)  Gabapentin 100 Mg Caps (Gabapentin) .... Take one tablet three times a day 15)  Debrox 6.5 % Soln (Carbamide peroxide) .... 4-5 drops in affected ear two times a day (label in spanish) 16)  Celebrex 100 Mg Caps (Celecoxib) .... One by mouth two times a day 17)  Vitamin D 1000 Unit Tabs (Cholecalciferol) .... One tab by mouth daily  Other Orders: [facility] [id number]) B12-[facility] [id number])  Patient Instructions: 1)  Move up the time you take the mirtazapine and the trazodone to 9 pm at night. Try this for one week. If no improvement in one week, increase the trazodone to one whole tablet at night. 2)  follow-up if not improving in the next 2-3 weeks 3)  After 2 weeks, decrease the celebrex to once a day.  4)  follow-up in 1-2 months. Prescriptions: TRAZODONE HCL 50 MG TABS (TRAZODONE HCL) Take 1/2 tablet q hs as needed for insomnia  #30 x 6   Entered and Authorized by:   Eugenie Norrie  MD   Signed by:   Eugenie Norrie  MD on 09/21/2009   Method used:   Electronically to        [facility] [phone]*  (retail)       [address]       [place], [place]  [address]       Ph: [phone]       Fax: [phone]   RxID:   [id number]

## 2010-06-03 NOTE — Progress Notes (Signed)
Summary: Rx  Phone Note Refill Request Message from:  mary lou/daughter  Refills Requested: Medication #1:  MIRTAZAPINE 45 MG TABS Take 1/2  tab at bedtime Initial call taken by: Samara Snide,  April 29, 2010 10:57 AM  Follow-up for Phone Call         will forward to Dr. Georgina Snell in Dr. Thompson Caul absence. Follow-up by: Marcell Barlow RN,  April 29, 2010 11:08 AM    Prescriptions: MIRTAZAPINE 45 MG TABS (MIRTAZAPINE) Take 1/2  tab at bedtime  #15 x 1   Entered and Authorized by:   Lynne Leader MD   Signed by:   Lynne Leader MD on 04/29/2010   Method used:   Electronically to        [facility] [phone]* (retail)       [address]       [place], [place]  [address]       Ph: [phone]       Fax: [phone]   RxID:   [id number]

## 2010-06-03 NOTE — Progress Notes (Signed)
Summary: phn msg  Phone Note Call from Patient Call back at [phone]   Caller: daughter-Mary York Cerise Summary of Call: has been having constipation for the last week and wants to know what she should do. Initial call taken by: Audie Clear,  March 05, 2010 10:48 AM  Follow-up for Phone Call        message left to call back. Follow-up by: Marcell Barlow RN,  March 05, 2010 11:47 AM  Additional Follow-up for Phone Call Additional follow up Details #1::        daughter states off and on for a while patient has had problem with  BM being sluggish. her last BM was 2 days ago and she really had to strain for the [facility] but she does state it was a good BM that day. no BM past 2 days . she has increased fluid intake , prune juice , friut , etc. wonders if she couild have Rx for Miralax. states she has had this in past. will send message to MD. pharmacy is [facility] , [address]. Additional Follow-up by: Marcell Barlow RN,  March 05, 2010 12:05 PM    Additional Follow-up for Phone Call Additional follow up Details #2::    tell pt miralax is otc, can do 2 capfulls two times a day as needed  Follow-up by: Hulan Saas DO,  March 05, 2010 1:24 PM  Additional Follow-up for Phone Call Additional follow up Details #3:: Details for Additional Follow-up Action Taken: daughter notified. Additional Follow-up by: Marcell Barlow RN,  March 05, 2010 3:00 PM

## 2010-06-03 NOTE — Assessment & Plan Note (Signed)
Summary: per Dr. Sarita Haver   Vital Signs:  Patient profile:   77 year old female Height:      59.5 inches Weight:      158 pounds BMI:     31.49 BSA:     1.68 Temp:     98.1 degrees F Pulse rate:   83 / minute BP sitting:   161 / 70  Vitals Entered By: Christen Bame CMA (Sep 01, 2009 10:00 AM) CC: f/u Is Patient Diabetic? No Pain Assessment Patient in pain? yes     Location: neck Intensity: 7   Primary Care Provider:  Eugenie Norrie  MD  CC:  f/u.  History of Present Illness: Pt is [place]. Brings daughter in to interpret.   1. neck pain Seen for cervicalgia in early March. Neurontin increased to 200 mg two times a day however pt felt like it made her dizzy so went back to one by mouth two times a day. States that her pain is on both sides of her neck and associated with some numbness of both hands. Has since increased tylenol to every 6 hours for the last 2 weeks with no improvement in her symptoms. Also uses a lidocaine patch.  Pt and daughter are concerned about medicines that may make her sleepy but currently feel that pain level is not satisfactory.   films done in Feb 2011 show: Cervical spine DDD, subluxation C5 on C6   Habits & Providers  Alcohol-Tobacco-Diet     Tobacco Status: never  Current Medications (verified): 1)  Aspirin 81 Mg  Tbec (Aspirin) .... Take One Tablet Daily 2)  Calcium Carbonate - Citracal .... Take One Tablet Bid 3)  Evista 60 Mg  Tabs (Raloxifene Hcl) .... Take One Tablet Daily 4)  Fosamax 70 Mg  Tabs (Alendronate Sodium) .... Take One Tablet Every Week On Tuesday Am 5)  Glucosamine 1200 Mg .... Take One Tablet Bid 6)  Multivitamin .... Take One Tablet Daily 7)  Remeron 45 Mg Tabs (Mirtazapine) .... Take One Tablet By Mouth Q Hs 8)  Synthroid 100 Mcg  Tabs (Levothyroxine Sodium) .... Take One Tablet Daily 9)  Lidoderm 5 %  Ptch (Lidocaine) .Marland Kitchen.. 1 Patch To Skin Once A Day. May Cut Patch and Use 1/4 or 1/2  of Patch Instead. 10)   Fish Oil Concentrate 1000 Mg  Caps (Omega-3 Fatty Acids) .... 2 Tabs Daily 11)  Trazodone Hcl 50 Mg Tabs (Trazodone Hcl) .... Take 1/2 Tablet Q Hs As Needed For Insomnia 12)  Lopressor 50 Mg Tabs (Metoprolol Tartrate) .... Take 1/2 Tab Two Times A Day 13)  Voltaren 1 % Gel (Diclofenac Sodium) .... Rub Two Times A Day On Painful Thumb 14)  Gabapentin 100 Mg Caps (Gabapentin) .... Take Two Tablets By Mouth Three Times A Day 15)  Debrox 6.5 % Soln (Carbamide Peroxide) .... 4-5 Drops in Affected Ear Two Times A Day (Label in Spanish) 16)  Celebrex 100 Mg Caps (Celecoxib) .... One By Mouth Two Times A Day  Allergies (verified): No Known Drug Allergies  Review of Systems       Positive for occasional dizziness. 10-point review of systems is otherwise negative except as noted in HPI.    Physical Exam  General:  vitals signs reviewed -- hypertensive but otherwise normal  Msk:  Some tendenessr to palpation paracervical areas bilaterally; no obvious spasm.  Some pain with lateral rotation bilaterally but largely perserved ROM.  Exam unchange from previous. Neurologic:  alert and oriented.  speech normal. station and gait normal. no gross deficitis.  Skin:  warm, good turgor; no rashes or lesions.    Impression & Recommendations:  Problem # 1:  CERVICALGIA (F4948081.1) Assessment Unchanged will increase gabapentin to 200 mg three times a day. Family to watch patient for any signs of sleepiness, gait problems. Also add celebrex -- pt has normal Cr and no history of GI bleeding. No history of CAD but will need to monitor patient on this medicine.  Toradol given today IM prior to leaving.  The following medications were removed from the medication list:    [facility] Ibuprofen 200 Mg Caps (Ibuprofen) .Marland Kitchen... Take 2 tablets three times a day as needed pain - buys this otc. Her updated medication list for this problem includes:    Aspirin 81 Mg Tbec (Aspirin) .Marland Kitchen... Take one tablet daily    Celebrex 100 Mg Caps  (Celecoxib) ..... One by mouth two times a day  Orders: Ketorolac-Toradol 15mg  [id number]) [place]- Est Level  3 [id number])  Complete Medication List: 1)  Aspirin 81 Mg Tbec (Aspirin) .... Take one tablet daily 2)  Calcium Carbonate - Citracal  .... Take one tablet bid 3)  Evista 60 Mg Tabs (Raloxifene hcl) .... Take one tablet daily 4)  Fosamax 70 Mg Tabs (Alendronate sodium) .... Take one tablet every week on tuesday am 5)  Glucosamine 1200 Mg  .... Take one tablet bid 6)  Multivitamin  .... Take one tablet daily 7)  Remeron 45 Mg Tabs (Mirtazapine) .... Take one tablet by mouth q hs 8)  Synthroid 100 Mcg Tabs (Levothyroxine sodium) .... Take one tablet daily 9)  Lidoderm 5 % Ptch (Lidocaine) .Marland Kitchen.. 1 patch to skin once a day. may cut patch and use 1/4 or 1/2  of patch instead. 10)  Fish Oil Concentrate 1000 Mg Caps (Omega-3 fatty acids) .... 2 tabs daily 11)  Trazodone Hcl 50 Mg Tabs (Trazodone hcl) .... Take 1/2 tablet q hs as needed for insomnia 12)  Lopressor 50 Mg Tabs (Metoprolol tartrate) .... Take 1/2 tab two times a day 13)  Voltaren 1 % Gel (Diclofenac sodium) .... Rub two times a day on painful thumb 14)  Gabapentin 100 Mg Caps (Gabapentin) .... Take two tablets by mouth three times a day 15)  Debrox 6.5 % Soln (Carbamide peroxide) .... 4-5 drops in affected ear two times a day (label in spanish) 16)  Celebrex 100 Mg Caps (Celecoxib) .... One by mouth two times a day  Patient Instructions: 1)  you can't stop the tylenol 2)  start the celebrex 3)  increase the gabapentin to 200 mg (2 of the 100 mg pills) three times a day 4)  please call me if you feel like this makes you sleepy or for other concerns 5)  we may consider a muscle relaxer, but let's see if this improves things 6)  follow-up with me in 2-3 weeks to discuss things further Prescriptions: CELEBREX 100 MG CAPS (CELECOXIB) one by mouth two times a day  #60 x 0   Entered and Authorized by:   Eugenie Norrie  MD   Signed by:    Eugenie Norrie  MD on 09/01/2009   Method used:   Electronically to        [facility] [phone]* (retail)       [address]       [place], [place]  [address]       Ph: [phone]  Fax: [phone]   [id number] GABAPENTIN 100 MG CAPS (GABAPENTIN) take two tablets by mouth three times a day  #90 x 1   Entered and Authorized by:   Eugenie Norrie  MD   Signed by:   Eugenie Norrie  MD on 09/01/2009   Method used:   Electronically to        [facility] [phone]* (retail)       [address]       [place], [place]  [address]       Ph: [phone]       Fax: [phone]   RxID:   [id number]    Medication Administration  Injection # 1:    Medication: Ketorolac-Toradol 15mg     Diagnosis: CERVICALGIA (F4948081.1)    Route: IM    Site: RUOQ gluteus    Exp Date: 05/03/2011    Lot #: [id number]    Mfr: novaplus    Comments: 30mg  given    Patient tolerated injection without complications    Given by: Christen Bame CMA (Sep 01, 2009 12:08 PM)  Orders Added: 1)  Ketorolac-Toradol 15mg  [[id number]] 2)  [facility]- Est Level  3 [id number]

## 2010-06-03 NOTE — Consult Note (Signed)
Summary: [facility]   Imported By: Raymond Gurney 12/11/2009 14:42:55  _____________________________________________________________________  External Attachment:    Type:   Image     Comment:   External Document

## 2010-06-03 NOTE — Assessment & Plan Note (Signed)
Summary: n/v/neck pain,df   Vital Signs:  Patient profile:   77 year old female Height:      59.5 inches Weight:      152 pounds BMI:     30.30 Temp:     97.8 degrees F oral Pulse rate:   71 / minute BP sitting:   113 / 57  (right arm) BP standing:   128 / 60 Cuff size:   regular  Vitals Entered By: Schuyler Amor CMA (March 29, 2010 1:42 PM)  Serial Vital Signs/Assessments:  Time      Position  BP       Pulse  Resp  Temp     By 2:44 PM   Lying LA  148/68                         Candelaria Celeste MD 2:44 PM   Sitting   132/66                         Candelaria Celeste MD 2:44 PM   Standing  128/60                         Candelaria Celeste MD  CC: nausea and vomiting x 1 week Is Patient Diabetic? No Pain Assessment Patient in pain? yes     Location: neck Intensity: 9   Primary Care Hyun Reali:  Eugenie Norrie  MD  CC:  nausea and vomiting x 1 week.  History of Present Illness: Nausea since one week ago, vomited x 1. Decreased appetite. No diarrhea or ill contacts. Daughter thinks the nausea relates to the neck pain, although her mother has brief spells of this twice weekly in recent years. Does feel like she's moving at times. Unsteady in her legs, falls, but catches herself without injury.   Neck pain is more severe. She participated in PT per her daughter, but it was very uncomfortable. Tramadol isn't helping. She isn't taking the Acetaminophen regularly.   Insomnia continues despite taking the Mirtazepine and Trazadone.   Depression continues to be a problem per her daughter.   Current Medications (verified): 1)  Aspirin 81 Mg  Tbec (Aspirin) .... Take One Tablet Daily 2)  Caltrate 600+d 600-400 Mg-Unit Tabs (Calcium Carbonate-Vitamin D) .... One By Mouth Two Times A Day 3)  Evista 60 Mg  Tabs (Raloxifene Hcl) .... Take One Tablet Daily 4)  Fosamax 70 Mg  Tabs (Alendronate Sodium) .... Take One Tablet Every Week On Tuesday Am 5)  Glucosamine 1200 Mg .... Take One Tablet Bid 6)   Multivitamin .... Take One Tablet Daily 7)  Synthroid 100 Mcg  Tabs (Levothyroxine Sodium) .... Take One Tablet Daily 8)  Fish Oil Concentrate 1000 Mg  Caps (Omega-3 Fatty Acids) .... 2 Tabs Daily 9)  Trazodone Hcl 50 Mg Tabs (Trazodone Hcl) .... Take 1 At Night As Needed For Insomnia 10)  Metoprolol Tartrate 25 Mg Tabs (Metoprolol Tartrate) .... Take 1/2  Tab Twice Daily 11)  Vitamin D 1000 Unit Tabs (Cholecalciferol) .... One Tab By Mouth Daily 12)  Toviaz 8 Mg Xr24h-Tab (Fesoterodine Fumarate) .... Take One Tab Daily 13)  Mirtazapine 45 Mg Tabs (Mirtazapine) .... Take 1/2  Tab At Bedtime 14)  Acetaminophen 650 Mg Cr-Tabs (Acetaminophen) .... Take One Tab Every 6 Hours 15)  Hydrocodone-Acetaminophen 5-500 Mg Tabs (Hydrocodone-Acetaminophen) .... Take 1/2 To 1 Tab 4 Times Daily  Allergies (verified):  No Known Drug Allergies  Physical Exam  General:  Vitals reviewed.  Obese.  No acute distress except for discomfort in her neck Head:  normocephalic.   Eyes:  pupils equal, pupils round, and pupils reactive to light.   Neck:  Mildly head forward position with decreased range of motion and tight paraspinal muscles Lungs:  normal respiratory effort, no crackles, and no wheezes.   Heart:  regular rate and rhythm, no murmurs; normal s1/s2  Neurologic:  alert & oriented X3, cranial nerves II-XII intact, strength normal in all extremities, and sensation intact to pinprick but decreased filament sensation in distal forefoot and thumbs.finger-to-nose normal.and Romberg negative.  Position sense normal in great toes. No nystagmus on Dix-Halpikes and dizziness not reliably produced in any position.    Impression & Recommendations:  Problem # 1:  CERVICALGIA (F4948081.1) Continues to be severe. Her thick feeling in her thumbs may respresent C6 radiculopathy. She may be a candidate for epidural steroids. MRI to rule out myelopathy, though she denies any changes in bowel or bladder other than her chronic  urinary incontinence. I reviewed the problems with muscle relaxants in the elderly with her daughter.  The following medications were removed from the medication list:    Tramadol Hcl 50 Mg Tabs (Tramadol hcl) .Marland Kitchen... 1/2 tab by mouth daily for pain Her updated medication list for this problem includes:    Aspirin 81 Mg Tbec (Aspirin) .Marland Kitchen... Take one tablet daily    Acetaminophen 650 Mg Cr-tabs (Acetaminophen) .Marland Kitchen... Take one tab every 6 hours    Hydrocodone-acetaminophen 5-500 Mg Tabs (Hydrocodone-acetaminophen) .Marland Kitchen... Take 1/2 to 1 tab 4 times daily  Orders: [place]- Est  Level 4 [id number]) MRI without Contrast (MRI w/o Contrast)  Problem # 2:  PERIPHERAL NEUROPATHY (ICD-356.9) New symptoms, and difficult to assess in her, but seems equal bilaterally so could have systemic cause.  Orders: B12-[facility] [id number]) Sed Rate (ESR)-[facility] [id number]) [place]- Est  Level 4 [id number]) MRI without Contrast (MRI w/o Contrast)  Problem # 3:  NAUSEA (ICD-787.02) Not associated with tinnitus or ear pressure. ? vertigo. Polypharmacy and pain may contribute.   Problem # 4:  DEPRESSIVE DISORDER, NOS (ICD-311) Will wean her off mirtazepine and Trazadone and replace with Cymbalta in hopes of also helping neck pain.  Her updated medication list for this problem includes:    Trazodone Hcl 50 Mg Tabs (Trazodone hcl) .Marland Kitchen... Take 1 at night as needed for insomnia    Mirtazapine 45 Mg Tabs (Mirtazapine) .Marland Kitchen... Take 1/2  tab at bedtime  Problem # 5:  HYPERTENSION, BENIGN SYSTEMIC (ICD-401.1) Well controlled. Borderline [place].  Her updated medication list for this problem includes:    Metoprolol Tartrate 25 Mg Tabs (Metoprolol tartrate) .Marland Kitchen... Take 1/2  tab twice daily  Orders: [facility]- Est  Level 4 [id number])  Complete Medication List: 1)  Aspirin 81 Mg Tbec (Aspirin) .... Take one tablet daily 2)  Caltrate 600+d 600-400 Mg-unit Tabs (Calcium carbonate-vitamin d) .... One by mouth two times a day 3)  Evista 60 Mg Tabs (Raloxifene hcl)  .... Take one tablet daily 4)  Fosamax 70 Mg Tabs (Alendronate sodium) .... Take one tablet every week on tuesday am 5)  Glucosamine 1200 Mg  .... Take one tablet bid 6)  Multivitamin  .... Take one tablet daily 7)  Synthroid 100 Mcg Tabs (Levothyroxine sodium) .... Take one tablet daily 8)  Fish Oil Concentrate 1000 Mg Caps (Omega-3 fatty acids) .... 2 tabs daily 9)  Trazodone Hcl 50 Mg Tabs (Trazodone  hcl) .... Take 1 at night as needed for insomnia 10)  Metoprolol Tartrate 25 Mg Tabs (Metoprolol tartrate) .... Take 1/2  tab twice daily 11)  Vitamin D 1000 Unit Tabs (Cholecalciferol) .... One tab by mouth daily 12)  Toviaz 8 Mg Xr24h-tab (Fesoterodine fumarate) .... Take one tab daily 13)  Mirtazapine 45 Mg Tabs (Mirtazapine) .... Take 1/2  tab at bedtime 14)  Acetaminophen 650 Mg Cr-tabs (Acetaminophen) .... Take one tab every 6 hours 15)  Hydrocodone-acetaminophen 5-500 Mg Tabs (Hydrocodone-acetaminophen) .... Take 1/2 to 1 tab 4 times daily  Other Orders: Comp Met-[facility] [id number]) CBC-[facility] [id number]) TSH-[facility] [phone])  Patient Instructions: 1)  I will call with lab results and to ask how your pain relief is. 2)  Decrease the Mirtazepine to 1/2 tab daily 3)  Hydrocodone will replace Tramadol Prescriptions: HYDROCODONE-ACETAMINOPHEN 5-500 MG TABS (HYDROCODONE-ACETAMINOPHEN) Take 1/2 to 1 tab 4 times daily  #60 x 3   Entered and Authorized by:   Candelaria Celeste MD   Signed by:   Candelaria Celeste MD on 03/29/2010   Method used:   Print then Give to Patient   RxID:   [id number]    Orders Added: 1)  Comp Met-[facility] [id number] 2)  CBC-[facility] [id number] 3)  TSH-[facility] [id number] 4)  B12-[facility] [[id number]] 5)  Sed Rate (ESR)-[facility] [85651] 6)  [place]- Est  Level 4 [99214] 7)  MRI without Contrast [MRI w/o Contrast]     Prevention & Chronic Care Immunizations   Influenza vaccine: Fluvax MCR  (02/22/2010)   Influenza vaccine due: 05/12/2009    Tetanus booster: 05/18/2009: Td   Tetanus  booster due: 05/19/2019    Pneumococcal vaccine: Done.  (01/30/1997)   Pneumococcal vaccine deferral: Not indicated  (08/12/2009)   Pneumococcal vaccine due: None    H. zoster vaccine: Not documented  Colorectal Screening   Hemoccult: Done.  (05/02/2004)   Hemoccult due: Not Indicated    Colonoscopy: Done.  (08/30/2004)   Colonoscopy due: 08/31/2014  Other Screening   Pap smear: Done.  (06/02/2000)   Pap smear due: Not Indicated    Mammogram: ASSESSMENT: Negative - BI-RADS 1^MM DIGITAL SCREENING  (09/01/2009)   Mammogram action/deferral: Not indicated  (08/12/2009)   Mammogram due: 08/11/2009    DXA bone density scan: Done.  (06/07/2004)   DXA bone density action/deferral: Not indicated  (12/15/2008)   DXA scan due: None    Smoking status: never  (02/22/2010)  Lipids   Total Cholesterol: 215  (09/21/2007)   LDL: 96  (09/21/2007)   LDL Direct: Not documented   HDL: 57  (09/21/2007)   Triglycerides: 310  (09/21/2007)  Hypertension   Last Blood Pressure: 113 / 57  (03/29/2010)   Serum creatinine: 0.77  (08/12/2009)   Serum potassium 4.2  (08/12/2009) CMP ordered     Hypertension flowsheet reviewed?: Yes   Progress toward BP goal: At goal  Self-Management Support :   Personal Goals (by the next clinic visit) :      Personal blood pressure goal: 140/90  (12/15/2008)   Hypertension self-management support: Not documented

## 2010-06-03 NOTE — Progress Notes (Signed)
Summary: Referral  Phone Note Call from Patient Call back at [phone]   Caller: Mary Lou/daughter Reason for Call: Talk to Nurse Summary of Call: needs another appt for neurologist. could not make the appt on 12/9. needs appt on a Monday.  Initial call taken by: Samara Snide,  April 29, 2010 10:58 AM  Follow-up for Phone Call        Spoke with patient daughter informed her that she would need to call and rescheduled his appt. She says that she tried but they were considered a noshow and now its up to Dr. Joya Salm to determine whether or not he will see her. Told her we could call and c heck on status but there was not much else we could since pt only wants to see Dr. Joya Salm. Called Dr. Harley Hallmark office, they are still awaiting a decision from him and they will contact pt once a decision has been made. Follow-up by: Levert Feinstein LPN,  December 29, 624THL 11:25 AM

## 2010-06-03 NOTE — Progress Notes (Signed)
Summary: phn msg  Phone Note Call from Patient   Caller: Daughter Summary of Call: is coming in this afternoon and daughter will not be able to come with her.  and wanted to let doctor know a few things states that she needs refill on Trazadone - [place] also, continues to have problems sleeping - wants to know if she can have her Vitamin D level checked Initial call taken by: Audie Clear,  May 18, 2009 1:57 PM

## 2010-06-03 NOTE — Progress Notes (Signed)
Summary: phn msg  Phone Note Call from Patient Call back at [phone]   Caller: Daughter-Mary York Cerise Summary of Call: needs to talk to Dr Walker Kehr - pls call when available  Initial call taken by: Audie Clear,  April 12, 2010 1:49 PM  Follow-up for Phone Call        See prior phone note Follow-up by: Candelaria Celeste MD,  April 12, 2010 4:27 PM

## 2010-06-03 NOTE — Letter (Signed)
Summary: Results Follow-up Letter  [facility]  [address]   [place], [place] [address]   Phone: [phone]  Fax: [phone]    05/24/2009  [place], [place]  [address]  Dear Ms. Mangione,   The following are the results of your recent blood tests.  Tests: (1) CBC NO Diff (Complete Blood Count) (10000)   WBC                       10.3 K/uL                   4.0-10.5   RBC                       4.35 MIL/uL                 3.87-5.11   Hemoglobin                14.0 g/dL                   12.0-15.0   Hematocrit                43.6 %                      36.0-46.0   MCV                  [H]  100.2 fL                    78.0-100.0   MCHC                      32.1 g/dL                   30.0-36.0  Platelet Count            258 K/uL                    150-400   RDW                       12.4 %                      11.5-15.5  No padece de anemia.  Tests: (2) Comprehensive Metabolic Panel (123456    Sodium                    140 mEq/L                   135-145   Potassium                 4.0 mEq/L                   3.5-5.3   Chloride                  101 mEq/L                   96-112   CO2                       24 mEq/L                    19-32   Glucose  92 mg/dL                    70-99   BUN                       23 mg/dL                    6-23   Creatinine                0.80 mg/dL                  0.40-1.20   Bilirubin, Total          0.3 mg/dL                   0.3-1.2   Alkaline Phosphatase      67 U/L                      39-117   AST/SGOT                  19 U/L                      0-37   ALT/SGPT                  16 U/L                      0-35   Total Protein             7.2 g/dL                    6.0-8.3   Albumin                   3.8 g/dL                    3.5-5.2   Calcium                   10.1 mg/dL                  8.4-10.5 La glucosa y el higado esta normal. Tests: (3) TSH (23280)   TSH                        3.741 uIU/mL                0.350-4.500     ***Test methodology is 3rd generation TSH*** La funcion de la glandula tiroides  esta norma.   Tests: (5) Vitamin D (25-Hydroxy) [id number])  Vitamin D (25-Hydroxy)                             48 ng/mL                    30-89     Su nivel de vitamina D esta normal. Candelaria Celeste MD [facility]           Appended Document: Results Follow-up Letter mailed.

## 2010-06-03 NOTE — Progress Notes (Signed)
  Phone Note Call from Patient   Caller: Daughter-Maria Call For: [phone] Summary of Call: Calling regarding mom's rx for Tramadol.  Was taking 1/2 of pill, but not relieving pain.  Want to know if she can take a whole pill instead.  PLease call after 1:00 Initial call taken by: Eusebio Friendly,  February 24, 2010 10:58 AM  Follow-up for Phone Call        called back told pt daughter that  Follow-up by: Hulan Saas DO,  February 25, 2010 1:44 PM  Additional Follow-up for Phone Call Additional follow up Details #1::        called daughter about pain tried tramadol whole pill but only 1 time a day.  Told her to try two times a day to three times a day if still pain then come in next week.  Additional Follow-up by: Hulan Saas DO,  February 25, 2010 4:37 PM

## 2010-06-03 NOTE — Progress Notes (Signed)
Summary: phn msg  Phone Note Call from Patient Call back at Home Phone [phone] Call back at [phone]   Caller: son-Carlos Summary of Call: pt returned call - is it hard to reach since he is at work - pls leave message as to the best time to call back. Initial call taken by: Audie Clear,  June 15, 2009 1:52 PM  Follow-up for Phone Call        now would be fine.  Discussed results with patient - how CM makes [place] think CHF, but no active evidence of failure.  consider echo to further w/u if patient so desires, however, could be expected in 77 yo.  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 Walker Kehr.  C spine - DDD.  to continue trial of gabapentin for several weeks and return early March for f/u. Follow-up by: Ria Bush  MD,  June 15, 2009 2:19 PM

## 2010-06-03 NOTE — Consult Note (Signed)
Summary: [facility]- Noseblled cauterized  [facility]   Imported By: Audie Clear 01/12/2010 16:19:04  _____________________________________________________________________  External Attachment:    Type:   Image     Comment:   External Document

## 2010-06-03 NOTE — Progress Notes (Signed)
Summary: fu R apical lung mass  Phone Note Outgoing Call   Call placed by: Candelaria Celeste MD,  April 05, 2010 10:59 AM Call placed to: Patient Action Taken: Assistance medications ready for pick up Summary of Call: Report of lab tests and scan to son. He OK's referral to neurologist. His sister will likely call with questions. I didn't mention the lung nodule to him.  Initial call taken by: son   Follow-up for Phone Call        I called and informed her son of the appointment time with Dr Joya Salm, but he is going to have to postpone that till next week. I also informed him of the right apical lung mass vs scar. We will delay CT scan of chest until the visit with Dr Joya Salm, in case he wants to do another test at the time of a chest CT.  Follow-up by: Candelaria Celeste MD,  April 12, 2010 10:05 AM  Additional Follow-up for Phone Call Additional follow up Details #1::        I spoke with daughter, [name]. the postponed appointment with Dr Joya Salm won't be until January so they want to procede with the lung CT.  Additional Follow-up by: Candelaria Celeste MD,  April 12, 2010 4:23 PM  New Problems: LOSS OF WEIGHT (ICD-783.21) MASS, LUNG (ICD-786.6)   New Problems: LOSS OF WEIGHT (ICD-783.21) MASS, LUNG (ICD-786.6)    Social History: lives with son, [place]; No tob, etoh.  Has three total children. 1 g'child.;  Dtr [name] (w) [phone], (h) [phone]; dtr [place]. Daughters usually interpret for her at visits and are very involved and ask lots of questions.

## 2010-06-03 NOTE — Progress Notes (Signed)
Summary: Rx  Phone Note Refill Request Call back at Home Phone [phone]   Refills Requested: Medication #1:  MIRTAZAPINE 45 MG TABS Take 1/2  tab at bedtime Initial call taken by: Samara Snide,  May 25, 2010 11:19 AM    Prescriptions: MIRTAZAPINE 45 MG TABS (MIRTAZAPINE) Take 1/2  tab at bedtime  #15 x 1   Entered and Authorized by:   Hulan Saas DO   Signed by:   Hulan Saas DO on 05/25/2010   Method used:   Electronically to        [facility] [phone]* (retail)       [address]       [place], [place]  [address]       Ph: [phone]       Fax: [phone]   RxID:   [id number]

## 2010-06-03 NOTE — Miscellaneous (Signed)
Summary: fmla  Patients son dropped off FMLA form to be filled out.  Please call him when completed. Beryle Lathe  May 26, 2010 5:00 PM  FMLA form placed in Dr. Thompson Caul box for completion.   Hedy Camara  May 26, 2010 6:22 PM  Filled out placed on Donna's Desk.  Resa Miner (son) notified FMLA form ready to be picked up at front desk.  Hedy Camara  May 28, 2010 9:53 AM

## 2010-06-03 NOTE — Progress Notes (Signed)
Summary: Rx  Phone Note Refill Request Call back at [phone]   Refills Requested: Medication #1:  MIRTAZAPINE 45 MG TABS Take 1/2  tab at bedtime pt started taking 1 pill nightly, 1/2 was not helping. pt would like dosing increased to 1 tablet daily.   Caller: Daughter  Follow-up for Phone Call        Done Follow-up by: Hulan Saas DO,  May 28, 2010 3:46 PM    New/Updated Medications: MIRTAZAPINE 45 MG TABS (MIRTAZAPINE) Take1 tab qhs Prescriptions: MIRTAZAPINE 45 MG TABS (MIRTAZAPINE) Take1 tab qhs  #90 x 2   Entered and Authorized by:   Hulan Saas DO   Signed by:   Hulan Saas DO on 05/28/2010   Method used:   Electronically to        [facility] [phone]* (retail)       [address]       [place], [place]  [address]       Ph: [phone]       Fax: [phone]   RxID:   [id number]

## 2010-06-03 NOTE — Progress Notes (Signed)
Summary: Rx Req  Phone Note Refill Request Message from:  Patient  Refills Requested: Medication #1:  CELEBREX 100 MG CAPS one by mouth two times a day [facility].  Initial call taken by: Raymond Gurney,  Sep 29, 2009 4:35 PM

## 2010-06-09 NOTE — Miscellaneous (Signed)
Summary: FMLA  Pt's son dropped off another FMLA form with specific question from HR. Questions are on separate sheet. Marland KitchenDarlyn Chamber  June 01, 2010 4:18 PM    FMLA form placed in Dr. Thompson Caul box...  Hedy Camara  June 02, 2010 9:35 AM   Back to But[facility] Penny filled out.  Son [name] notified form is ready to by picked up... Hedy Camara  June 02, 2010 1:34 PM

## 2010-06-09 NOTE — Consult Note (Signed)
Summary: [facility]   Imported By: Audie Clear 06/04/2010 12:40:35  _____________________________________________________________________  External Attachment:    Type:   Image     Comment:   External Document

## 2010-06-13 ENCOUNTER — Encounter: Payer: Self-pay | Admitting: Family Medicine

## 2010-06-14 ENCOUNTER — Telehealth: Payer: Self-pay | Admitting: Family Medicine

## 2010-06-14 ENCOUNTER — Ambulatory Visit (INDEPENDENT_AMBULATORY_CARE_PROVIDER_SITE_OTHER): Payer: Medicare Other | Admitting: Family Medicine

## 2010-06-14 ENCOUNTER — Encounter: Payer: Self-pay | Admitting: Family Medicine

## 2010-06-14 VITALS — BP 130/54 | HR 80 | Temp 98.2°F | Ht 59.0 in | Wt 152.4 lb

## 2010-06-14 DIAGNOSIS — M542 Cervicalgia: Secondary | ICD-10-CM

## 2010-06-14 DIAGNOSIS — G4700 Insomnia, unspecified: Secondary | ICD-10-CM

## 2010-06-14 DIAGNOSIS — F329 Major depressive disorder, single episode, unspecified: Secondary | ICD-10-CM

## 2010-06-14 DIAGNOSIS — E039 Hypothyroidism, unspecified: Secondary | ICD-10-CM

## 2010-06-14 DIAGNOSIS — I10 Essential (primary) hypertension: Secondary | ICD-10-CM

## 2010-06-14 DIAGNOSIS — G609 Hereditary and idiopathic neuropathy, unspecified: Secondary | ICD-10-CM

## 2010-06-14 LAB — TSH: TSH: 4.293 u[IU]/mL (ref 0.350–4.500)

## 2010-06-14 MED ORDER — GABAPENTIN 100 MG PO CAPS: ORAL_CAPSULE | ORAL | Status: DC

## 2010-06-14 MED ORDER — MELOXICAM 15 MG PO TABS: 7.5000 mg | ORAL_TABLET | Freq: Every day | ORAL | Status: DC

## 2010-06-14 MED ORDER — MIRTAZAPINE 45 MG PO TABS: 45.0000 mg | ORAL_TABLET | Freq: Every day | ORAL | Status: DC

## 2010-06-14 NOTE — Patient Instructions (Signed)
Good to see you I want you to try taking neurontin again.  I want you to take 1 pill qhs for the next 3 days then 2 pills for the three days after that and then start taking 2 pills nightly.  I am also going to give you a new medication called meloxicam.  Try taking one pill daily.  DO NOT take with motrin or advil and always take with food.  We will check your thyroid today and will call you if I have to make changes.  I want to see you again in 4-6 weeks to see how you are doing.

## 2010-06-14 NOTE — Assessment & Plan Note (Signed)
At goal continue to monitor

## 2010-06-14 NOTE — Progress Notes (Signed)
  Subjective:    Patient ID: Jordan [name], female    DOB: 1919-12-19, 77 y.o.   MRN: [id number]  HPI 77 yo female here for  1.  Chronic neck pain-  Pt states that she has been having worsening of her neck pain. Pt states that she stopped taking the neurontin due to she did not feel it was helping states she only took it for about a week, pt states she has been taking motrin multiple times a day and does help somewhat.  Pt still finds it very difficult to do many activities of daily living and is seeing a neurosurgeon this afternoon for other type of modalities.   Pt states the pain stays in the neck with some radiation to the arm sometime worse with certain movements, no chest pain, no shortness of breath dyspnea on exertion or trouble swallowing. Denies fever or chills no vision changes and no pain on the side of her head.  No loss of muscle strength.   2.  Thyroid-  Hypo has not had labs checked for some time denies any swelling or hair loss possible weight gain.  Pt denies any side effects to the medication .  3.  Insomnia-  Pt states she is still having trouble sleeping even with taking a full dose of Remeron and trazodone. Pt states she is lucky enough to get three hours a night and is awaken due to pain or very easily awoken from other things. Pt states she does go to bed most of the time at the same time, tries to keep a routine to help her sleep.    Marland Kitchen Hypertension Blood pressure at home:120-130 SBP Blood pressure today: 130/54 Taking Meds:yes Side effects:no ROS: Denies headache visual changes nausea, vomiting, chest pain or abdominal pain or shortness of breath.  Review of Systems see above     Objective:   Physical Exam General Appearance:    Alert, cooperative, no distress, appears stated age daughter in room to help translate.   Head:    Normocephalic, without obvious abnormality, atraumatic no temporal pain no cord on side of head.   Eyes:    PERRL, conjunctiva/corneas clear,  EOM's intact,   Ears:    Normal TM's and external ear canals, both ears     Throat:   Lips, mucosa, and tongue normal; teeth and gums normal  Neck:   Supple, symmetrical, trachea midline, no adenopathy;    thyroid:  no enlargement/tenderness/nodules; Pt does have positive Spurling sign on left decreased ROM due to pain.      Lungs:     Clear to auscultation bilaterally, respirations unlabored      Heart:    Regular rate and rhythm, S1 and S2 normal, no murmur, rub   or gallop     Abdomen:     Soft, non-tender, bowel sounds active all four quadrants,    no masses, no organomegaly        Extremities:   Extremities normal, atraumatic, no cyanosis or edema  Pulses:   2+ and symmetric all extremities  Skin:   Skin color, texture, turgor normal, no rashes or lesions     Neurologic:   CNII-XII intact, normal strength, sensation and reflexes    throughout         Assessment & Plan:

## 2010-06-14 NOTE — Assessment & Plan Note (Signed)
Pt has not had level for sometime will see how pt is doing likely no changes is needed otherwise will call.

## 2010-06-14 NOTE — Assessment & Plan Note (Signed)
Would like pt to try to take neurontin again because it seemed to work in the past and will increase amount.  Will also do Meloxicam low dose to help told to take with food and told pt of red flags to look for with the medication. Pt is to see the neurosurgeon today and will see what plan the come back with.  Will see pt again in 1 month.

## 2010-06-15 NOTE — Telephone Encounter (Signed)
Can you guys check on this please? 

## 2010-06-15 NOTE — Telephone Encounter (Signed)
Spoke with Pharmacist Corene Cornea) at [facility].  He is not sure why they called yesterday.  The patient picked up the Meloxicam and Gabapentin yesterday, will be able to pick up the Remeron today because they had to order it.  Call complete

## 2010-06-30 ENCOUNTER — Observation Stay (HOSPITAL_COMMUNITY)
Admission: EM | Admit: 2010-06-30 | Discharge: 2010-07-01 | DRG: 312 | Disposition: A | Discharge status: Home / Self Care | Payer: Medicare Other | Attending: Family Medicine | Admitting: Family Medicine

## 2010-06-30 ENCOUNTER — Inpatient Hospital Stay (INDEPENDENT_AMBULATORY_CARE_PROVIDER_SITE_OTHER)
Admission: RE | Admit: 2010-06-30 | Discharge: 2010-06-30 | Disposition: A | Discharge status: Home / Self Care | Payer: Medicare Other | Source: Ambulatory Visit

## 2010-06-30 ENCOUNTER — Emergency Department (HOSPITAL_COMMUNITY): Payer: Medicare Other

## 2010-06-30 ENCOUNTER — Encounter: Payer: Self-pay | Admitting: Family Medicine

## 2010-06-30 ENCOUNTER — Telehealth: Payer: Self-pay | Admitting: Family Medicine

## 2010-06-30 DIAGNOSIS — M1990 Unspecified osteoarthritis, unspecified site: Secondary | ICD-10-CM | POA: Insufficient documentation

## 2010-06-30 DIAGNOSIS — R51 Headache: Secondary | ICD-10-CM

## 2010-06-30 DIAGNOSIS — Z79899 Other long term (current) drug therapy: Secondary | ICD-10-CM | POA: Insufficient documentation

## 2010-06-30 DIAGNOSIS — R42 Dizziness and giddiness: Secondary | ICD-10-CM

## 2010-06-30 DIAGNOSIS — G609 Hereditary and idiopathic neuropathy, unspecified: Secondary | ICD-10-CM | POA: Insufficient documentation

## 2010-06-30 DIAGNOSIS — R55 Syncope and collapse: Principal | ICD-10-CM | POA: Insufficient documentation

## 2010-06-30 DIAGNOSIS — N390 Urinary tract infection, site not specified: Secondary | ICD-10-CM | POA: Insufficient documentation

## 2010-06-30 DIAGNOSIS — R269 Unspecified abnormalities of gait and mobility: Secondary | ICD-10-CM | POA: Insufficient documentation

## 2010-06-30 DIAGNOSIS — I10 Essential (primary) hypertension: Secondary | ICD-10-CM | POA: Insufficient documentation

## 2010-06-30 DIAGNOSIS — G4700 Insomnia, unspecified: Secondary | ICD-10-CM | POA: Insufficient documentation

## 2010-06-30 DIAGNOSIS — R5381 Other malaise: Secondary | ICD-10-CM | POA: Insufficient documentation

## 2010-06-30 DIAGNOSIS — F329 Major depressive disorder, single episode, unspecified: Secondary | ICD-10-CM | POA: Insufficient documentation

## 2010-06-30 DIAGNOSIS — F3289 Other specified depressive episodes: Secondary | ICD-10-CM | POA: Insufficient documentation

## 2010-06-30 DIAGNOSIS — K5730 Diverticulosis of large intestine without perforation or abscess without bleeding: Secondary | ICD-10-CM | POA: Insufficient documentation

## 2010-06-30 DIAGNOSIS — M542 Cervicalgia: Secondary | ICD-10-CM | POA: Insufficient documentation

## 2010-06-30 DIAGNOSIS — N393 Stress incontinence (female) (male): Secondary | ICD-10-CM | POA: Insufficient documentation

## 2010-06-30 DIAGNOSIS — E039 Hypothyroidism, unspecified: Secondary | ICD-10-CM | POA: Insufficient documentation

## 2010-06-30 LAB — CBC
MCH: 33.1 pg (ref 26.0–34.0)
Platelets: 173 10*3/uL (ref 150–400)
RBC: 3.72 MIL/uL — ABNORMAL LOW (ref 3.87–5.11)
RDW: 12 % (ref 11.5–15.5)
WBC: 10 10*3/uL (ref 4.0–10.5)

## 2010-06-30 LAB — POCT CARDIAC MARKERS
CKMB, poc: <1 ng/mL — ABNORMAL LOW (ref 1.0–8.0)
Troponin i, poc: <0.05 ng/mL (ref 0.00–0.09)

## 2010-06-30 LAB — DIFFERENTIAL
Basophils Absolute: 0 10*3/uL (ref 0.0–0.1)
Basophils Relative: 0 % (ref 0–1)
Lymphocytes Relative: 34 % (ref 12–46)
Monocytes Relative: 6 % (ref 3–12)
Neutro Abs: 5.7 10*3/uL (ref 1.7–7.7)
Neutrophils Relative %: 57 % (ref 43–77)

## 2010-06-30 LAB — POCT I-STAT, CHEM 8
BUN: 25 mg/dL — ABNORMAL HIGH (ref 6–23)
Calcium, Ion: 0.67 mmol/L — CL (ref 1.12–1.32)
Chloride: 105 mEq/L (ref 96–112)
HCT: 36 % (ref 36.0–46.0)
Potassium: 4.4 mEq/L (ref 3.5–5.1)
Sodium: 140 mEq/L (ref 135–145)

## 2010-06-30 LAB — PROTIME-INR
INR: 0.92 (ref 0.00–1.49)
Prothrombin Time: 12.6 seconds (ref 11.6–15.2)

## 2010-06-30 LAB — BASIC METABOLIC PANEL
BUN: 22 mg/dL (ref 6–23)
Chloride: 103 mEq/L (ref 96–112)
Creatinine, Ser: 1.02 mg/dL (ref 0.4–1.2)
GFR calc Af Amer: >60 mL/min (ref >60)
GFR calc non Af Amer: 51 mL/min — ABNORMAL LOW (ref >60)

## 2010-06-30 LAB — URINALYSIS, ROUTINE W REFLEX MICROSCOPIC
Bilirubin Urine: NEGATIVE
Ketones, ur: NEGATIVE mg/dL
Nitrite: NEGATIVE
Specific Gravity, Urine: 1.015 (ref 1.005–1.030)
Urobilinogen, UA: 0.2 mg/dL (ref 0.0–1.0)

## 2010-06-30 LAB — URINE MICROSCOPIC-ADD ON

## 2010-06-30 NOTE — Telephone Encounter (Signed)
Spoke with daughter and she states patient had a dizzy spell this AM as she was sitting on toliet. then later had a spell as she was standing at sink.  She decided to take two halves of her BP pill instead  of usual dose. Advised daughter that increasing medication on her own is not a good idea that she really needs to be checked in order to know what is going on with her today. Advised to take to Urgent Care now for evaluation.

## 2010-06-30 NOTE — Telephone Encounter (Signed)
Has had 2 dizzy spells today back to back and would like to talk to nurse

## 2010-06-30 NOTE — H&P (Signed)
[facility] Admission History and Physical  [name] Medical record number: [id number] Date of birth: 1919-06-30 Age: 77 y.o. Gender: female  Primary Care Provider: Hulan Saas, DO, [facility]  Chief Complaint: Dizzyness History of Present Illness: Patient says that Wednesday she was in her bathroom washing her hands and she suddenly felt very dizzy and weak.  She says she felt like someone was moving her making her unsteady, but says the room was not spinning and she had no vision changes.  She says she grabbed onto the sink to keep from falling, otherwise she thinks she would have fallen to the floor.  She say she had to sit down on the toilet for several minutes before she had enough strength and to walk to the phone to call for help.  She says she started feeling a headache in the back of her head shortly after the dizzy spell. She says she felt like her blood pressure was high, so she took an extra 12.5 mg of Metoprolol.  Patient recently had her gabapentin tapered up to help with her neck pain.   Patient states she has been having urinary frequency, but denies fever, chills, back pain, chest pain, shortness of breath, diaphoresis, nausea/vomiting.    Patient Active Problem List  Diagnoses  . HYPOTHYROIDISM, UNSPECIFIED  . DEPRESSIVE DISORDER, NOS  . CARPAL TUNNEL SYNDROME  . PERIPHERAL NEUROPATHY  . DECREASED HEARING, RIGHT EAR  . HYPERTENSION, BENIGN SYSTEMIC  . HERNIA, INCISIONAL VENTRAL W/O OBST/GNGR  . DIVERTICULOSIS OF COLON  . GALLSTONES  . INTERSTITIAL CYSTITIS  . CYSTOCELE/RECTOCELE/PROLAPSE,UNSPEC.  . INCONTINENCE, STRESS, FEMALE  . OSTEOARTHRITIS, MULTI SITES  . CERVICALGIA  . OSTEOPOROSIS  . INSOMNIA NOS  . NAUSEA  . INCOMPLETE BLADDER EMPTYING  . INCONTINENCE, URGE  . ACCIDENTAL FALL, HX OF  . LOSS OF WEIGHT  . MASS, LUNG  . Insomnia   Past Medical History: Past Medical History  Diagnosis Date  .  Lung mass     right apical need CT in end of 2012  . Cervicalgia   . Peripheral neuropathy   . Frailty   . Hypertension   . Hypothyroid   . Osteoporosis   . Insomnia     Family History: Patient's mom had Kidney disease.  Says her father died of old age.    Social History: Pt lives with her son, she is widowed.  She denies tobacco, alcohol, or drug use.   Allergies: No Known Allergies  Current Outpatient Prescriptions  Medication Sig Dispense Refill  . acetaminophen (TYLENOL) 650 MG CR tablet Take 650 mg by mouth every 6 (six) hours.        Marland Kitchen alendronate (FOSAMAX) 70 MG tablet Take 70 mg by mouth every 7 (seven) days. Take in the morning with a full glass of water, on an empty stomach, and do not take anything else by mouth or lie down for the next 30 min.  Take on Tuesday mornings       . aspirin 81 MG tablet Take 81 mg by mouth daily.        . Calcium Carbonate-Vitamin D (CALTRATE 600+D) 600-400 MG-UNIT per tablet Take 1 tablet by mouth 2 (two) times daily.        . calcium-vitamin D 185 MG TABS Take 185 mg by mouth daily.        Marland Kitchen Fesoterodine Fumarate (TOVIAZ) 8 MG TB24 Take 1 tablet by mouth daily.        Marland Kitchen  fish oil-omega-3 fatty acids 1000 MG capsule Take 2 g by mouth daily.        Marland Kitchen gabapentin (NEURONTIN) 100 MG capsule Take 1 tab at night for the next three days then 2 pills at night for three days then 3 pills nightly there after.  90 capsule  2  . Glucosamine-Chondroitin-Vit D3 1500-1200-800 MG-MG-UNIT PACK Take 1 tablet by mouth 2 (two) times daily.        Marland Kitchen levothyroxine (SYNTHROID, LEVOTHROID) 100 MCG tablet Take 100 mcg by mouth daily.        . meloxicam (MOBIC) 15 MG tablet Take 0.5 tablets (7.5 mg total) by mouth daily. Take with food stop if you start getting stomach pain  30 tablet  2  . metoprolol (LOPRESSOR) 25 MG tablet Take 1/2 tablet twice daily       . mirtazapine (REMERON) 45 MG tablet Take 1 tablet (45 mg total) by mouth at bedtime. Take 1/2 tablet at  bedtime   30 tablet  48  . Multiple Vitamin (MULTIVITAMIN) tablet Take 1 tablet by mouth daily.        . raloxifene (EVISTA) 60 MG tablet Take 60 mg by mouth daily.        . traZODone (DESYREL) 50 MG tablet Take 50 mg by mouth at bedtime.         Review Of Systems: Per HPI.  Otherwise 12 point review of systems was performed and was unremarkable.  Physical Exam: T 98.1   P 63  RR 17   BP 119/52  Pox >98% RA General: NAD, interactive and appropriate HEENT: PERRL, EOMI, pharynx non-erythematous, MMM Heart: regular rate, no murmurs Lungs: Normal respiratory effort. Lungs CTABL, no crackles or wheezes. Abdomen: SNTND, no guarding  Extremities: negative edema, ROM grossly preserved Skin: No sores or suspicious lesions or rashes or color changes Neurology: Alert and oriented, CN II-XII grossly intact, + nystagmus with horizontal extra ocular movements.  Dick's Hallpike is negative.  Finger-to nose movements in-tact.  MSK: Strength is 5/5 and equal in upper and lower extremities.      Chemistry      Component Value Date/Time   NA 139 06/30/2010 2116   K 4.0 06/30/2010 2116   CL 103 06/30/2010 2116   CO2 30 06/30/2010 2116   BUN 22 06/30/2010 2116   CREATININE 1.02 06/30/2010 2116  Glucose        92    Component Value Date/Time   CALCIUM 8.8 06/30/2010 2116   ALKPHOS 60 03/29/2010 2127   AST 21 03/29/2010 2127   ALT 19 03/29/2010 2127   BILITOT 0.3 03/29/2010 2127     Lab Results  Component Value Date   WBC 10.0 06/30/2010   HGB 12.2 06/30/2010   HCT 36.0 06/30/2010   MCV 99.7 06/30/2010   PLT 173 06/30/2010   Lab Results  Component Value Date   INR 0.92 06/30/2010   Urinalysis:  Color, Urine                             YELLOW            YELLOW  Appearance                               CLOUDY     a      CLEAR  Specific Gravity  1.015             1.005-1.030  pH                                       6.5               5.0-8.0  Urine Glucose                             NEGATIVE          NEG              mg/dL  Bilirubin                                NEGATIVE          NEG  Ketones                                  NEGATIVE          NEG              mg/dL  Blood                                    LARGE      a      NEG  Protein                                  30         a      NEG              mg/dL  Urobilinogen                             0.2               0.0-1.0          mg/dL  Nitrite                                  NEGATIVE          NEG  Leukocytes                               LARGE      a      NEG   Squamous Epithelial / LPF                RARE              RARE  WBC / HPF                                SEE NOTE.         <3               WBC/hpf    TOO NUMEROUS TO COUNT  RBC / HPF  7-10              <3               RBC/hpf  Bacteria / HPF                           MANY       a      RARE  POC cardiac markers:  CKMB, POC                                <1.0       l      1.0-8.0          ng/mL  Troponin I, POC                          <0.05             0.00-0.09        ng/mL  Myoglobin, POC                           107               12-200           ng/mL  CT Head: No acute intracranial pathology  Assessment and Plan: 77 year old female who presents with Dizziness:  1. Dizziness- now resolved, but could be due to UTI, or a possible TIA, Vertigo, orthostasis or medication side effects. Will admit for TIA work up and obtain TSH, ECHO, and MRI brain.  Will obtain orthostatic blood pressures tomorrow as patient took extra dose of metoprolol earlier.  Will also consider tapering her medications if after work up this is felt to be due to medication side effects.   2. UTI- Will treat with PO keflex, and send Gram stain and Urine culture.   3. Weakness- Will consult PT/OT to evaluate the patient for mobility safety.  4. FEN/GI: Heart Healthy Diet, saline lock IV.  5. Prophylaxis: Heparin 5000 Units SQ TID 6. Disposition:  Pending clinical improvement.

## 2010-07-01 ENCOUNTER — Encounter: Payer: Self-pay | Admitting: Family Medicine

## 2010-07-01 ENCOUNTER — Inpatient Hospital Stay (HOSPITAL_COMMUNITY): Payer: Medicare Other

## 2010-07-01 DIAGNOSIS — E860 Dehydration: Secondary | ICD-10-CM

## 2010-07-01 DIAGNOSIS — R5381 Other malaise: Secondary | ICD-10-CM

## 2010-07-01 DIAGNOSIS — N390 Urinary tract infection, site not specified: Secondary | ICD-10-CM

## 2010-07-01 DIAGNOSIS — R5383 Other fatigue: Secondary | ICD-10-CM

## 2010-07-01 LAB — APTT: aPTT: 30 seconds (ref 24–37)

## 2010-07-01 LAB — URINALYSIS, ROUTINE W REFLEX MICROSCOPIC
Bilirubin Urine: NEGATIVE
Ketones, ur: NEGATIVE mg/dL
Nitrite: POSITIVE — AB
Protein, ur: NEGATIVE mg/dL
Urine Glucose, Fasting: NEGATIVE mg/dL
pH: 6.5 (ref 5.0–8.0)

## 2010-07-01 LAB — COMPREHENSIVE METABOLIC PANEL
ALT: 16 U/L (ref 0–35)
AST: 21 U/L (ref 0–37)
Albumin: 2.8 g/dL — ABNORMAL LOW (ref 3.5–5.2)
CO2: 29 mEq/L (ref 19–32)
Calcium: 8.8 mg/dL (ref 8.4–10.5)
Creatinine, Ser: 0.97 mg/dL (ref 0.4–1.2)
GFR calc Af Amer: >60 mL/min (ref >60)
GFR calc non Af Amer: 54 mL/min — ABNORMAL LOW (ref >60)
Sodium: 142 mEq/L (ref 135–145)
Total Protein: 6.1 g/dL (ref 6.0–8.3)

## 2010-07-01 LAB — POCT CARDIAC MARKERS: Myoglobin, poc: 101 ng/mL (ref 12–200)

## 2010-07-01 LAB — URINE MICROSCOPIC-ADD ON

## 2010-07-01 LAB — CARDIAC PANEL(CRET KIN+CKTOT+MB+TROPI)
CK, MB: 1.2 ng/mL (ref 0.3–4.0)
Total CK: 38 U/L (ref 7–177)

## 2010-07-01 LAB — PROTIME-INR: Prothrombin Time: 13.3 seconds (ref 11.6–15.2)

## 2010-07-01 LAB — CBC
HCT: 34.9 % — ABNORMAL LOW (ref 36.0–46.0)
Hemoglobin: 11.3 g/dL — ABNORMAL LOW (ref 12.0–15.0)
MCH: 32.2 pg (ref 26.0–34.0)
MCV: 99.4 fL (ref 78.0–100.0)
Platelets: 175 10*3/uL (ref 150–400)
RBC: 3.51 MIL/uL — ABNORMAL LOW (ref 3.87–5.11)
WBC: 8.3 10*3/uL (ref 4.0–10.5)

## 2010-07-01 LAB — HEMOGLOBIN A1C
Hgb A1c MFr Bld: 5.6 % (ref <5.7)
Mean Plasma Glucose: 114 mg/dL (ref <117)

## 2010-07-01 LAB — LIPID PANEL
HDL: 50 mg/dL (ref >39)
Total CHOL/HDL Ratio: 3.8 RATIO
Triglycerides: 256 mg/dL — ABNORMAL HIGH (ref <150)
VLDL: 51 mg/dL — ABNORMAL HIGH (ref 0–40)

## 2010-07-02 LAB — URINE CULTURE

## 2010-07-12 NOTE — H&P (Signed)
NAME:  Jordan [name], Jordan [name]                    ACCOUNT NO.:  [id number]  MEDICAL RECORD NO.:  [id number]           PATIENT TYPE:  I  LOCATION:  3022                         FACILITY:  [facility]  PHYSICIAN:  Jamal Collin. Hensel, M.D.DATE OF BIRTH:  Oct 05, 1919  DATE OF ADMISSION:  07/01/2010 DATE OF DISCHARGE:                             HISTORY & PHYSICAL   PRIMARY CARE PROVIDER:  Hulan Saas, DO at [facility].  CHIEF COMPLAINT:  Dizziness.  History is provided by the patient and her son and her daughter.  HISTORY OF PRESENT ILLNESS:  The patient says that on Wednesday, she was in the bathroom washing her hands.  She suddenly felt dizzy and weak. She says she felt like someone was moving her and making her unsteady, but denies the room spinning and any vision changes.  She says that she had to grab onto the sink to keep from falling, otherwise she thinks she would have fallen to the floor.  She says that she had to sit down on the toilet for several minutes before she had enough strength to walk to the phone to call for help.  She says that she started feeling a headache in the back of her head shortly after the dizzy spell.  She says that she felt like her blood pressure was high, so she took an extra 12.5 mg of metoprolol.  The patient recently had her gabapentin tapered up to 300 mg p.o. at bedtime to help with her neck pain.  Her daughter is wondering if medication side effects could be the cause of her dizziness.  The patient states that she has been having urinary frequency for several days, but she denies having fever, chills, back pain, chest pain, shortness of breath, diaphoresis, nausea, vomiting, or upper respiratory infection symptoms.  PAST MEDICAL HISTORY: 1. Hypothyroidism. 2. Depressive disorder. 3. Peripheral neuropathy. 4. Hypertension. 5. Diverticulosis. 6. Interstitial cystitis. 7. Stress incontinence. 8. Osteoarthritis. 9.  Osteoporosis. 10.Insomnia. 11.Right apical lung mass that is being followed  FAMILY HISTORY:  The patient's mother had kidney disease.  She says her father died of old age.  SOCIAL HISTORY:  The patient lives with her son.  She is widowed.  She denies tobacco, alcohol, or drug use.  ALLERGIES:  No known drug allergies.  MEDICATIONS: 1. Tylenol 650 mg p.o. q.6. 2. Fosamax 70 mg once weekly. 3. Aspirin 81 mg p.o. daily. 4. Caltrate/vitamin D 600/400 one tablet p.o. b.i.d. 5. Toviaz 8 mg p.o. daily. 6. Fish oil 1000 mg 2 tablets p.o. daily. 7. Neurontin 300 mg p.o. at bedtime. 8. Glucosamine chondroitin/vitamin D 1500/1200/800 mg p.o. b.i.d. 9. Synthroid 100 mcg p.o. daily. 10.Meloxicam 7.5 mg p.o. daily. 11.Lopressor 12.5 mg p.o. daily. 12.Remeron 45 mg p.o. at bedtime. 13.Multivitamin 1 tablet p.o. daily. 14.Evista 60 mg p.o. daily. 15.Trazodone 50 mg p.o. at bedtime.  REVIEW OF SYSTEMS:  See HPI.  PHYSICAL EXAMINATION:  VITAL SIGNS:  Temperature is 98.1, pulse 63, respiratory rate 17, blood pressure 119/52, pulse ox greater than 98% on room air. GENERAL:  No acute distress.  The patient is interactive  and appropriate.  There is a language barrier.  Her daughter prefers to translate. HEAD, EYES, EARS, NOSE, AND THROAT:  Pupils are equal, round, and reactive to light.  Extraocular movements intact with positive horizontal nystagmus.  Pharynx nonerythematous.  Mucous membranes are moist. HEART:  Regular rate and rhythm.  No murmurs. LUNGS:  Clear to auscultation bilaterally. ABDOMEN:  Positive bowel sounds, soft, nontender.  No guarding. EXTREMITIES:  2+ pulses.  No edema. SKIN:  No rashes or suspicious lesions. NEUROLOGIC:  The patient is alert and oriented.  Cranial nerves II through XII are intact.  The patient has positive nystagmus with horizontal extraocular movements.  Dix-Hallpike test is negative. Finger-to-nose movements are intact. MUSCULOSKELETAL:  Strength  is 5/5 and equal in upper and lower extremities.  LABS AND STUDIES:  Basic metabolic panel:  Sodium [id number], potassium 4.0, chloride 103, CO2 of 30, BUN 22, creatinine 1.02, glucose 92.  CBC: White blood cell count 10.0, hemoglobin 12.12, hematocrit 36.0, platelets 173.  INR is 0.92.  Urinalysis significant for large blood, 30 protein, large leukocytes.  Urine micro shows rare epithelial cells, white blood cells too numerous to count, red blood cells 7-10, bacteria many.  Point-of-care cardiac enzymes are negative.  A CT of the head shows no acute intracranial pathology.  ASSESSMENT AND PLAN:  This is an 77 year old female, who presents with dizziness episodes. 1. Dizziness.  This is now resolved, but could be due to a urinary     tract infection versus possible transient ischemic attack, vertigo,     orthostasis, or medication side effects.  We will admit the patient     for a transient ischemic attack workup and obtain a TSH,     echocardiogram, and an MRI of the brain.  We will obtain     orthostatic blood pressures tomorrow as the patient took an extra     dose of her metoprolol earlier today.  We will also consider     tapering her medications if after the workup this is felt to be due     to medication side effects.  We will consult physical therapy and     occupational therapy to work with the patient for her weakness as     well as to evaluate her for vertigo. 2. Urinary tract infection.  We will treat the patient with p.o.     Keflex.  We will send a Gram stain and urine culture. 3. Weakness.  Again, we will consult PT/OT to evaluate the patient for     her mobility safety. 4. Chronic medical diseases.  We will place the patient on her home     medication regimen, but consider tapering medications such as     metoprolol, gabapentin that could be causing dizziness. 5. Fluids, electrolytes, nutrition/gastrointestinal.  We will give her     heart-healthy diet.  We will gently  hydrate her with IV fluids. 6. Prophylaxis.  We will give heparin 5000 units subcu t.i.d. 7. Disposition is pending clinical improvement.    ______________________________ Cletus Gash, MD   ______________________________ Jamal Collin. Andria Frames, M.D.    CR/MEDQ  D:  07/01/2010  T:  07/01/2010  Job:  [id number]  Electronically Signed by Cletus Gash MD on 07/11/2010 12:20:38 PM Electronically Signed by Madison Hickman M.D. on 07/12/2010 09:10:16 AM

## 2010-07-13 ENCOUNTER — Ambulatory Visit (INDEPENDENT_AMBULATORY_CARE_PROVIDER_SITE_OTHER): Payer: Medicare Other | Admitting: Family Medicine

## 2010-07-13 ENCOUNTER — Encounter: Payer: Self-pay | Admitting: Family Medicine

## 2010-07-13 VITALS — BP 160/68 | HR 86 | Temp 98.1°F | Wt 150.0 lb

## 2010-07-13 DIAGNOSIS — M542 Cervicalgia: Secondary | ICD-10-CM

## 2010-07-13 DIAGNOSIS — G4700 Insomnia, unspecified: Secondary | ICD-10-CM

## 2010-07-13 DIAGNOSIS — G609 Hereditary and idiopathic neuropathy, unspecified: Secondary | ICD-10-CM

## 2010-07-13 DIAGNOSIS — Z6825 Body mass index (BMI) 25.0-25.9, adult: Secondary | ICD-10-CM

## 2010-07-13 DIAGNOSIS — I10 Essential (primary) hypertension: Secondary | ICD-10-CM

## 2010-07-13 DIAGNOSIS — E663 Overweight: Secondary | ICD-10-CM

## 2010-07-13 DIAGNOSIS — E039 Hypothyroidism, unspecified: Secondary | ICD-10-CM

## 2010-07-13 MED ORDER — LEVOTHYROXINE SODIUM 112 MCG PO TABS: 100.0000 ug | ORAL_TABLET | Freq: Every day | ORAL | Status: DC

## 2010-07-13 MED ORDER — KETOROLAC TROMETHAMINE 30 MG/ML IJ SOLN: 30.0000 mg | Freq: Once | INTRAMUSCULAR | Status: DC

## 2010-07-13 MED ORDER — GABAPENTIN 100 MG PO CAPS: ORAL_CAPSULE | ORAL | Status: DC

## 2010-07-13 MED ORDER — TRAZODONE HCL 50 MG PO TABS: 25.0000 mg | ORAL_TABLET | Freq: Every day | ORAL | Status: DC

## 2010-07-13 MED ORDER — OXYCODONE-ACETAMINOPHEN 5-325 MG PO TABS: 1.0000 | ORAL_TABLET | Freq: Three times a day (TID) | ORAL | Status: DC | PRN

## 2010-07-13 NOTE — Assessment & Plan Note (Signed)
No at goal at the moment but is in a lot of pain, will not increase any medication until pain better controlled and check then.  Most recent hospitalization was for hypotension orthostatics it appeared so will be very careful getting too tight of control. Pt has no red flags.

## 2010-07-13 NOTE — Assessment & Plan Note (Signed)
Worsening, pt had seen nuerosurgery but does not want any surgical intervention,  Pt is being seen in pain clinic and is going to start having injections now starting this Thursday.  Pt had to stop her meloxicam at this time and aspirin.  Will give her just a little narcotic to help her at this time told to start with 1/2 tab tid and increase to full pill if needed, gave pt and family the pros and cons of the medication and they know of the red flags to look out for. Will increase neurontin to 100mg  in AM and the 3 pills in pm. Will have pt come back in 1 month to see how this is working.

## 2010-07-13 NOTE — Assessment & Plan Note (Signed)
Will make trazodone prn.

## 2010-07-13 NOTE — Patient Instructions (Signed)
I am so sorry you are in pain and I hope the injections will help I will give you a narcotic to try called percocet.  Try a half a pill three times daily only when having severe pain.  If not helping can take a full pill Try taking one pill of the neurontin as well in the morning in addition to your three pills at night. Try taking half the trazodone at night if having trouble sleeping but the percocet may help this as well.  I wan to see you again in 2-4 weeks to make sure the pain is getting better.

## 2010-07-13 NOTE — Assessment & Plan Note (Signed)
Increase to 112 mcg at time of discharge will get follow up in 3 months of TSH

## 2010-07-13 NOTE — Progress Notes (Signed)
  Subjective:    Patient ID: Jordan [name], female    DOB: [birth year], 77 y.o.   MRN: [id number]  HPI Pt is here for follow up from hospital.  Pt had a near syncopal episode, took two of her blood pressure medications and the felt worse, found to have some orthostatic hypotension. Pt is doing much better only taking half of her metoprolol and feeling good in that aspect but many other problems.   Neck pain-  Pt saw neurosurgery but decided not to have surgical intervention.  Pt then sent to pain clinic Dr. Courtney Paris, is going to start having injections on Thursday and will see how pt does.  At the moment though pt is off her meloxicam and in significant amount of pain.  Pt is tearful as of now. Pt states she is not sleeping well due to the pain and is also having trouble doing most regular ADL's.  Pt denies any new symptoms and overall is about the same. No loss of sensation or weakness in extremities no bowel or bladder problems.  . Hypertension Blood pressure at [id number] systolic Blood pressure today: see vitals Taking Meds:yes including half metoprolol Side effects:no ROS: Denies headache visual changes nausea, vomiting, chest pain or abdominal pain or shortness of breath.  Hypothyroidism. Pt TSh was elevated at 7 in hospital increased to 1102mcg on discharge, seems to be doing well will recheck TSH in 3 mmonths.  Review of Systems    see above Objective:   Physical Exam     General Appearance:    Alert, cooperative, no distress, appears stated age daughter in room to help translate. Mildly tearful  Head:    Normocephalic, without obvious abnormality, atraumatic no temporal pain no cord on side of head.   Eyes:    PERRL, conjunctiva/corneas clear, EOM's intact,         Throat:   Lips, mucosa, and tongue normal; teeth and gums normal  Neck:   Supple, symmetrical, trachea midline, no adenopathy;    thyroid:  no enlargement/tenderness/nodules; Pt does have positive Spurling sign on left  decreased ROM due to pain. No changes from previous exam     Lungs:     Clear to auscultation bilaterally, respirations unlabored      Heart:    Regular rate and rhythm, S1 and S2 normal, no murmur, rub   or gallop     Abdomen:     Soft, non-tender, bowel sounds active all four quadrants,    no masses, no organomegaly        Extremities:   Extremities normal, atraumatic, no cyanosis or edema  Pulses:   2+ and symmetric all extremities  Skin:   Skin color, texture, turgor normal, no rashes or lesions     Neurologic:   CNII-XII intact, normal strength, sensation and reflexes    throughout       Assessment & Plan:

## 2010-08-02 ENCOUNTER — Encounter: Payer: Self-pay | Admitting: Family Medicine

## 2010-08-02 ENCOUNTER — Ambulatory Visit (INDEPENDENT_AMBULATORY_CARE_PROVIDER_SITE_OTHER): Payer: Medicare Other | Admitting: Family Medicine

## 2010-08-02 DIAGNOSIS — G4700 Insomnia, unspecified: Secondary | ICD-10-CM

## 2010-08-02 DIAGNOSIS — M542 Cervicalgia: Secondary | ICD-10-CM

## 2010-08-02 DIAGNOSIS — I10 Essential (primary) hypertension: Secondary | ICD-10-CM

## 2010-08-02 NOTE — Assessment & Plan Note (Signed)
Improved with better pain control, hx of orthostatic hypotension so will allow pt to be minorly elevated.  Pt will continue current regimen.

## 2010-08-02 NOTE — Assessment & Plan Note (Signed)
Improved, pt has got  Some relief from the injections, told her that she will likely have some discomfort at baseline and this will be something she will have to cope with. Encouraged to continue current regimen, pt has had some trouble with ambulation so do not feel increasing the neurontin  would be beneficial at this time. Will monitor.

## 2010-08-02 NOTE — Assessment & Plan Note (Signed)
Will have pt start taking trazadone again, gave family member red flags to look out for.  Told pt to take medicine 1 hour before going to bed to try to get enough time for medicine to work.

## 2010-08-02 NOTE — Patient Instructions (Signed)
Take nighttime meds one hour before you go to bed including the Trazadone.  I want you to try to increase your activity and walk with a walker to help you. Continue to see Dr. Courtney Paris if it is helping.  Take all other medication as prescribed.  I need to see you again in 2 months to make sure you are doing well and to check your thyroid.

## 2010-08-02 NOTE — Progress Notes (Signed)
  Subjective:    Patient ID: Jordan [name], female    DOB: [birth year], 77 y.o.   MRN: [id number]  HPI   Neck pain-  Pt saw neurosurgery but decided not to have surgical intervention.  Pt has had two injections since last visit and has noticed a mild improvement in pain.  Pt is still having a little of the shooting pain down the left side but overall not having as much pain with activities of daily living.   Pt then sent to pain clinic Dr. Courtney Paris.  Pt does want to continue the meloxicam has helped no stomach side effects.   No loss of sensation or weakness in extremities no bowel or bladder problems.    Hypertension Blood pressure at home: 149/69 today  Blood pressure today: see vitals Taking Meds:yes including half metoprolol Side effects:no ROS: Denies headache visual changes nausea, vomiting, chest pain or abdominal pain or shortness of breath.  Pt does feel weak overall.   Hypothyroidism.Pt states still feeling weak and not having much more energy. Review of Systems as above.   Insomnia-  Pt though is still having trouble sleeping, takes her medicine when she gets into bed and takes about one hour for her to fall asleep. Pt has not been taking the trazadone.    Objective:   Physical Exam  General Appearance:    Alert, cooperative, no distress, appears stated age daughter in room to help translate  Head:    Normocephalic, without obvious abnormality, atraumatic no temporal pain no cord on side of head.   Eyes:    PERRL, conjunctiva/corneas clear, EOM's intact,   Neck:   Supple, symmetrical, trachea midline, no adenopathy;    thyroid:  no enlargement/tenderness/nodules; Pt does have positive Spurling sign on left decreased ROM due to pain but improvement from last visit.    Lungs:     Clear to auscultation bilaterally, respirations unlabored   Heart:    Regular rate and rhythm, S1 and S2 normal, no murmur, rub   or gallop  Abdomen:     Soft, non-tender, bowel sounds active all four  quadrants,    no masses, no organomegaly  Extremities:   Extremities normal, atraumatic, no cyanosis or edema  Pulses:   2+ and symmetric all extremities       Assessment & Plan:

## 2010-08-03 NOTE — Discharge Summary (Signed)
NAME:  Jordan [name], Jordan [name]                    ACCOUNT NO.:  [id number]  MEDICAL RECORD NO.:  [id number]           PATIENT TYPE:  I  LOCATION:  3022                         FACILITY:  [facility]  PHYSICIAN:  Talbert Cage, M.D.DATE OF BIRTH:  07-18-1919  DATE OF ADMISSION:  06/30/2010 DATE OF DISCHARGE:  07/01/2010                              DISCHARGE SUMMARY   DISCHARGE DIAGNOSES: 1. Presyncope. 2. Osteoarthritis. 3. History of hypothyroidism. 4. Depressive disorder. 5. Peripheral neuropathy. 6. Hypertension. 7. Diverticulosis. 8. Interstitial cystitis. 9. Stress incontinence. 10.Osteoarthritis. 11.Cervicalgia. 12.Insomnia.  DISCHARGE MEDICATIONS: 1. Keflex 500 mg p.o. b.i.d. x6 days. 2. Tylenol 650 mg p.o. q.6 h. p.r.n. for pain. 3. Aspirin 81 mg p.o. daily. 4. Calcium citrate/vitamin D over-the-counter one tablet p.o. b.i.d. 5. Loxapine 60 mg p.o. daily. 6. Fish oil 1000 mg two tablets p.o. daily. 7. Fosamax 70 mg p.o. q. Tuesday. 8. Glucosamine OTC b.i.d. 9. Levothyroxine 100 mcg p.o. daily. 10.Mirtazapine 15 mg half tab p.o. nightly. 11.Meloxicam 15 mg half tab p.o. daily. 12.Multivitamin. 13.Neurontin 100 mg three tablets p.o. nightly. 14.Toviaz 8 mg p.o. daily. 15.Vitamin D3 1000 units p.o. daily.  MEDICATIONS DISCONTINUED: 1. Trazodone 50 mg p.o. nightly. 2. Metoprolol 25 mg half tablet p.o. b.i.d.  LABORATORY STUDIES AND PROCEDURES: 1. A1c 5.6. 2. TSH 7.4. 3. Urinalysis showing moderate leukocyte esterase, positive nitrites,     moderate blood, 21-50 white blood cells, and 7-10 red blood cells     with many bacteria. 4. Lipid profile; cholesterol 192, triglyceride 256, HDL 50, LDL 91.     Cardiac enzymes and coags negative.  IMAGING:  CT of head showed no acute abnormalities, but extensive atherosclerotic calcification that is stable.  HOSPITAL COURSE:  This is an 77 year old female who presented with a presyncopal episode. 1. Presyncope.  The patient  described this episode as occurring     earlier in the day when she was standing in the bathroom and     suddenly felt lightheaded and weak.  Her gait was unsteady and had     to grab on to the sink to prevent her falling, otherwise she was     able to sit down and rest for several minutes before her symptoms     resolved.  She also describes a lower cervical and posterior     headache similar to her history of cervicalgia.  The patient     underwent initial workup for TIA including head CT which was     negative and orthostatic blood pressures which were     negative.  An EKG showed normal sinus rhythm with first-degree     heart block, otherwise negative.  She also gave a history of taking an     extra dose of metoprolol on day of admission, it was felt that her     symptoms most likely to be consistent with overmedication.  Her     metoprolol was held on admission and the patient received IV fluid     hydration with improvement of her dizzy spells.  Further stroke     workup was not pursued  as it was felt her symptoms to be     inconsistent with presentation of possible transient ischemic     attack or vertiginous etiology.  In addition, her home dose of     trazodone was discontinued to avoid any medication-induced side     effects. 2. Urinary tract infection.  The patient was found to have urinalysis     grossly positive for urinary tract infection; therefore, was     started empirically on Keflex.  Her urine culture was pending at     the time of discharge; however, the patient will continue a 7-day     course of this medication.  It is also possible this infection to     be contributing to her transient presyncope.  DISCHARGE INSTRUCTIONS:  The patient was discharged home with her daughter and instructed to return to [facility] if the presyncope return, her headache worsen, she has visual changes, shortness of breath, chest pain or any other concerning symptoms.   She may consume regular diet and activity ad lib.  The patient refuses home health services at this time.  FOLLOWUP APPOINTMENTS:  Dr. Tamala Julian at [facility] on July 13, 2010 at 10:30 a.m.  FOLLOWUP ISSUES: 1. Blood pressure management. 2. Polypharmacy. 3. Levothyroxine dosing.  The patient was discharged to home in stable medical condition.    ______________________________ Luis Abed, MD   ______________________________ Talbert Cage, M.D.   JK/MEDQ  D:  07/01/2010  T:  07/02/2010  Job:  [id number]  Electronically Signed by Luis Abed MD on 07/06/2010 05:03:16 PM Electronically Signed by Talbert Cage M.D. on 08/03/2010 03:30:23 PM

## 2010-08-10 LAB — CBC
Hemoglobin: 12.5 g/dL (ref 12.0–15.0)
Platelets: 171 10*3/uL (ref 150–400)
RDW: 12.3 % (ref 11.5–15.5)

## 2010-08-10 LAB — POCT I-STAT, CHEM 8
BUN: 28 mg/dL — ABNORMAL HIGH (ref 6–23)
Chloride: 104 mEq/L (ref 96–112)
Creatinine, Ser: 1.2 mg/dL (ref 0.4–1.2)
Sodium: 138 mEq/L (ref 135–145)

## 2010-08-10 LAB — COMPREHENSIVE METABOLIC PANEL
ALT: 16 U/L (ref 0–35)
Alkaline Phosphatase: 47 U/L (ref 39–117)
CO2: 28 mEq/L (ref 19–32)
Calcium: 9.8 mg/dL (ref 8.4–10.5)
GFR calc non Af Amer: 51 mL/min — ABNORMAL LOW (ref >60)
Glucose, Bld: 92 mg/dL (ref 70–99)
Sodium: 140 mEq/L (ref 135–145)

## 2010-08-10 LAB — CARDIAC PANEL(CRET KIN+CKTOT+MB+TROPI)
Total CK: 174 U/L (ref 7–177)
Troponin I: 0.02 ng/mL (ref 0.00–0.06)
Troponin I: 0.02 ng/mL (ref 0.00–0.06)

## 2010-08-10 LAB — LIPID PANEL
Cholesterol: 211 mg/dL — ABNORMAL HIGH (ref 0–200)
LDL Cholesterol: 130 mg/dL — ABNORMAL HIGH (ref 0–99)
Triglycerides: 161 mg/dL — ABNORMAL HIGH (ref <150)

## 2010-08-10 LAB — CK TOTAL AND CKMB (NOT AT ARMC)
CK, MB: 6.3 ng/mL — ABNORMAL HIGH (ref 0.3–4.0)
Relative Index: 3.6 — ABNORMAL HIGH (ref 0.0–2.5)
Total CK: 177 U/L (ref 7–177)

## 2010-08-10 LAB — TROPONIN I: Troponin I: 0.02 ng/mL (ref 0.00–0.06)

## 2010-08-10 LAB — TSH: TSH: 2.881 u[IU]/mL (ref 0.350–4.500)

## 2010-09-13 ENCOUNTER — Other Ambulatory Visit: Payer: Self-pay | Admitting: Family Medicine

## 2010-09-13 MED ORDER — RALOXIFENE HCL 60 MG PO TABS: 60.0000 mg | ORAL_TABLET | Freq: Every day | ORAL | Status: DC

## 2010-09-13 NOTE — Telephone Encounter (Signed)
Been sent twice as well.  Will send again.

## 2010-09-13 NOTE — Telephone Encounter (Signed)
Checking status of refill requested last week for evista. Says pharmacy has requested twice.

## 2010-09-14 NOTE — Discharge Summary (Signed)
NAME:  Jordan [name], Jordan [name]                    ACCOUNT NO.:  [id number]   MEDICAL RECORD NO.:  [id number]          PATIENT TYPE:  INP   LOCATION:  6702                         FACILITY:  [facility]   PHYSICIAN:  Wayne A. Walker Kehr, M.D.    DATE OF BIRTH:  Oct 09, 1919   DATE OF ADMISSION:  09/15/2008  DATE OF DISCHARGE:  09/16/2008                               DISCHARGE SUMMARY   PRIMARY CARE PHYSICIAN:  Dr. Danise Mina at the [facility]   DISCHARGE DIAGNOSES:  1. Status post fall.  2. EKG changes with ST elevation.  3. Hypertension.  4. Hypothyroidism.   DISCHARGE MEDICATIONS:  1. Tylenol 650 mg p.o. q.6 h.  2. Aspirin 81 mg p.o. once daily.  3. Os-Cal 500 mg p.o. twice daily.  4. Metoprolol 12.5 mg p.o. twice daily, this is a new dose.  5. HCTZ 25 mg p.o. once daily.  6. Synthroid 100 mcg p.o. once daily.  7. Mirtazapine 45 mg p.o. once daily or nightly.  8. Multivitamin p.o. once daily.  9. Omega-3 fatty acid 1 tablet p.o. daily.  10.Ibuprofen 400 mg p.o. every 8 hours as needed for pain.  11.Trazodone 50 mg p.o. at bedtime p.r.n. sleep/insomnia.  12.Evista 60 mg p.o. daily.  13.Fosamax 70 mg p.o. once weekly.  14.Glucosamine 1200 mg p.o. twice daily.  15.Vitamin C 500 mg capsule p.o. once daily.  16.B complex capsule p.o. once daily.  17.Fish oil 1000 mg 2 capsules p.o. daily.   CONSULTATION:  None.   PROCEDURES:  Head CT without contrast on Sep 15, 2008, that showed large  hematoma over left supraorbital rim.  No underlying fracture or acute  intracranial abnormality.   PERTINENT LABORATORY DATA:  1. TSH within normal limit at 2.88.  2. Total cholesterol of 211, triglyceride 161, HDL 49, LDL 130.  3. CMET within normal limit with the exception of BUN of 25 and      albumin of 2.8.  4. Serial cardiac enzymes with an initial elevation in CK-MB of 6.3      which then decreased to 5.8 and then once again decreased to 5.2.      Troponin has been negative ranging from  0.02 in all 3 sets.  5. CBC that was within normal limit.   BRIEF HOSPITAL COURSE:  This is an 78 year old female s/p fall trying to  put on some clothing.  1. She was admitted for concern of cardiac abnormality or arrhythmia      causing the fall.  She was ruled out with cardiac enzymes.  So,      therefore this fall was most likely external cause with her trying      to put on some cloth.  Currently, we are managing her pain with      scheduled Tylenol and ibuprofen as needed.  EKG changes with first-      degree heart block and new T-wave inversion.  This was compared to      an EKG that was from 4 years ago.  A repeat EKG in the morning of  discharge which showed similar EKG to the one on admission.  The      patient has remained in normal sinus rhythm overnight on telemetry.      She is in normal sinus rhythm this morning with first-degree heart      block.  She denies any chest pain.  Her cardiac enzymes have been      negative.  We will continue her on her aspirin and metoprolol and      heart-healthy diet at this time.  Abnormal EKG should be compared      to any EKG that is available in [facility].  2. Hypertension.  The patient is not orthostatic at this time.  Her      systolic pressure has ranged in 108-124.  Her pulse is in 56-69.      Given her risk of falls and her age, it is not necessary to put her      on a high-dose beta-blocker.  Although she does need a beta-blocker      given her hypertension and hyperlipidemia history.  We will      decrease her metoprolol from 25 mg p.o. twice daily to 12.5 mg p.o.      twice daily.  We will continue her on her HCTZ 25 mg p.o. once      daily.  3. Hypothyroidism.  Her TSH is within normal limit at 2.881.  The      patient is to continue on Synthroid 100 mcg p.o. daily.  4. Prophylaxis for trip to [place].  The patient can follow this up with      her PCP, Dr. Jerline Pain on an outpatient basis.   DISCHARGE INSTRUCTIONS:    ACTIVITY:  Increase activity slowly, walk with assistance.   DIET:  Low-sodium heart-healthy diet.   WOUND CARE:  Not applicable.   HOME HEALTH:  The patient is to have home health physical therapy as was  determined by our physical therapist during this admission.  Social work  will assist [place] in setting this up for the patient.   FOLLOWUP APPOINTMENT:  The patient is to see Dr. Danise Mina on Sep 22, 2008, at 8:30.  Of note, Dr. Jerline Pain can help the patient with  malaria prophylaxis for her trip to [place].       Merla Riches, MD  Electronically [place] A. Walker Kehr, M.D.  Electronically Signed    CT/MEDQ  D:  09/16/2008  T:  09/17/2008  Job:  [id number]   cc:   Rolly Salter, M.D.

## 2010-09-14 NOTE — H&P (Signed)
NAME:  Jordan [name], Jordan [name]                    ACCOUNT NO.:  [id number]   MEDICAL RECORD NO.:  [id number]          PATIENT TYPE:  INP   LOCATION:  6702                         FACILITY:  [facility]   PHYSICIAN:  Wayne A. Walker Kehr, M.D.    DATE OF BIRTH:  [birth year]   DATE OF ADMISSION:  09/15/2008  DATE OF DISCHARGE:                              HISTORY & PHYSICAL   CHIEF COMPLAINT:  Fall.   HISTORY OF PRESENT ILLNESS:  The patient is an 77 year old female who  this morning was sitting in the toilet (bedside commode per family),  went to stand up and pulled pants up and she tripped over her pants  while putting them back on and fell to the side hitting her left  forehead and eye on the dresser.  She had no loss of consciousness.  There was no nausea, vomiting or confusion and she was in pain  immediately after this happened.  There was no chest pain, palpitations  or dizziness per her report.  She had no sweating and no recent illness  (vomiting or diarrhea).  No other complaints.   We were called by the [facility]PA because the EKG showed new T-wave inversions  in anterolateral leads.  She is not currently having chest pain or any  other complaints.   PAST MEDICAL HISTORY:  1. Restless leg syndrome.  2. Uterine prolapse and benign ovarian mass.  3. Spinal stenosis.  4. Incomplete emptying of bladder with stress and urge incontinence.  5. Osteoporosis.  6. History of chronic cystitis.  7. Gallstones.  8. Osteoarthritis.  9. Insomnia.  10.Hypothyroidism.  11.Hypertension.  12.Diverticulosis.  13.Depressive disorder.   PAST SURGICAL HISTORY:  1. Bilateral cataract surgery.  2. Total hysterectomy with bilateral salpingo-oophorectomy.  3. Right carpal tunnel release.  4. Pelvic laparoscopy or laparotomy to remove a benign ovarian mass.   SOCIAL HISTORY:  She lives with her daughter and is [place] and plans  to taking of a trip in 2 weeks back there.  No tobacco or alcohol abuse.  She has three  children and one grandchild.   FAMILY HISTORY:  Father passed away in his 71s from unknown cause.  Mother died at 61 from kidney problems.   REVIEW OF SYSTEMS:  As per HPI.   ALLERGIES:  No known drug allergies.   MEDICATIONS:  1. Aspirin 81 mg daily.  2. Calcium carbonate p.o. b.i.d.  3. Evista 60 mg daily.  4. Fosamax 70 mg weekly.  5. Glucosamine 1200 mg b.i.d.  6. Multivitamin daily.  7. Remeron 45 mg at bedtime.  8. Synthroid 100 mcg daily.  9. Tylenol 650 mg t.i.d.  10.Lopressor and hydrochlorothiazide 25/12.5 twice a day.  11.Ibuprofen 400 mg t.i.d. p.r.n.  12.Lidoderm patch applied to affected areas daily as needed.  13.Fish oil 1000 mg caplets 2 caplets daily.  14.Trazodone 50 mg nightly p.r.n. insomnia.  15.Vitamin C 500 mg caps daily.  16.B complex caps daily.   PHYSICAL EXAMINATION:  VITAL SIGNS:  Temperature 97.3, pulse 62,  respirations 22, blood pressure supine 124/46, sitting 142/62 and  standing  119/49, has significant change in pulse, pulse ox 97% on room  air.  GENERAL:  Well-developed, well-nourished in no acute distress, alert and  cooperative, pleasant.  Daughter is interpreting.  Son is also at the  bedside.  HEENT:  Large hematoma/ecchymosis in the left frontal area in the  periorbital region.  Pupils equal, round and reactive to light.  Extraocular movements intact.  No conjunctival inflammation or  hemorrhage.  Pharynx is normal without erythema or exudates.  Mucous  membranes are moist.  External ear exam is normal.  Tympanic membranes  without bulging erythema or hemotympanum and no nasal discharge.  NECK:  No lymphadenopathy, thyromegaly, masses or bruits.  CARDIOVASCULAR:  Regular rate and rhythm.  No murmurs, rubs or gallops  heard.  LUNGS:  Normal respiratory effort.  Clear to auscultation bilaterally.  No wheezes, rales or rhonchi.  ABDOMEN:  Soft, nontender, nondistended.  Bowel sounds normoactive.  No  hepatosplenomegaly.  She does have  an incisional hernia that is  reducible.  No other masses.  EXTREMITIES:  Warm, well-perfused.  No edema or cyanosis.  SKIN:  No rashes or lesions on visible skin with 2+ DP pulses.  NEURO:  Alert and oriented x3.  Cranial nerves II-XII grossly intact.  She is moving all extremities.  Sensation is intact to light touch in  all extremities.  Did not ambulate the patient.   LABORATORY DATA AND STUDIES:  Sodium 138, potassium 3.5, chloride 104,  bicarb 28, BUN 28, creatinine 1.2, glucose 95, hemoglobin 12.9,  hematocrit 38, troponin I 0.02, CK-MB 6.2, CK index 3.6.  EKG was normal  sinus with first degree AV block, T inversions in V3-V6.  They are new.  She has an isolated Q-wave in lead III.  No ST elevations or  depressions.  CT of the head with a large hematoma on the left  supraorbital rim but no fracture and no intracranial abnormality.   ASSESSMENT AND PLAN:  This is an 77 year old female with:  1. External fall from commode most likely a fall due to tripping and      up from cardiac abnormality or arrhythmia.  She did not have a      prodrome of chest pain, sweating, palpitations, and dizziness and      she does not have any of these symptoms now.  No loss of      consciousness or vomiting.  She tripped on her pants and tried to      put them on.  Ice over the area 15 minutes at a time 4 times a day      for 48 hours to minimize swelling.  Tylenol scheduled for pain with      ibuprofen as needed above this.  Physical Therapy and Occupational      Therapy consult and this may be completed as an outpatient if this      will hold up her discharge and family is able to care for her at      home as they have.  2. Abnormal EKG.  She has first-degree heart block and new T-wave      inversion but this was compared to an EKG from 4 years ago and      wonder if this is acutely new or just incidental finding given that      she did not have and does not have any complaints that could be       related to her heart at this time.  Fall seems to occur due to      tripping.  First set of cardiac enzymes negative.  We will monitor      on telemetry for 22-hour observation, cardiac enzymes x3 sets.      Check lipids in the morning.  Continue aspirin and metoprolol,      heart-healthy diet.  3. Unspecified problems with limbs other problems.  This was in her      problem list and we will get a Physical Therapy and Occupational      Therapy consult.  4. Hypertension not truly orthostatic.  From lying to standing there      is no significant change in her blood pressure or pulse.  She did      feel little dizzy when she was going to the bathroom in the      emergency department.  We will monitor and continue her Lopressor      and hydrochlorothiazide.  5. Hypothyroidism.  Check TSH in the morning.  Continue Synthroid.  6. Screening for malaria.  The patient stated to me at the end of the      visit that she is planning to go to [place] in 2 weeks and was to have      an appointment in the clinic to start malaria prophylaxis so on      discharge she will need a prescription for medications for      prophylaxis.        Karlton Lemon, M.D.  Electronically [place] A. Walker Kehr, M.D.  Electronically Signed    SH/MEDQ  D:  09/15/2008  T:  09/16/2008  Job:  [id number]

## 2010-09-17 NOTE — H&P (Signed)
Jordan [name], Jordan [name]                    ACCOUNT NO.:  [id number]   MEDICAL RECORD NO.:  [id number]          PATIENT TYPE:  EMS   LOCATION:  MAJO                         FACILITY:  [facility]   PHYSICIAN:  Carolyn Stare, MDDATE OF BIRTH:  [birth year]   DATE OF ADMISSION:  09/27/2004  DATE OF DISCHARGE:                                HISTORY & PHYSICAL   NO DICTATION      IM/MEDQ  D:  09/27/2004  T:  09/27/2004  Job:  [id number]

## 2010-09-17 NOTE — H&P (Signed)
Jordan [name], ENGDAHL                    ACCOUNT NO.:  [id number]   MEDICAL RECORD NO.:  [id number]          PATIENT TYPE:  INP   LOCATION:  [id number]                         FACILITY:  [facility]   PHYSICIAN:  Carolyn Stare, MDDATE OF BIRTH:  [birth year]   DATE OF ADMISSION:  09/27/2004  DATE OF DISCHARGE:                                HISTORY & PHYSICAL   CHIEF COMPLAINT:  Diarrhea, bloody.   HISTORY OF PRESENT ILLNESS:  This is an 77 year old white female with a  history of not feeling well during the last six weeks, generalized  discomfort, and headache.  On Saturday, she began to have low abdominal  pain, cramping intermittent, followed by diarrhea more than seen  on Sunday  that became loose, bloody, associated with nausea.  No vomiting.  The  patient has been afebrile.  No other complaints at this moment.   REVIEW OF SYSTEMS:  CONSTITUTIONAL:  Weight loss 4-5 pounds in the last  month.  CARDIOVASCULAR:  No palpitations, no chest pain.  RESPIRATORY:  Dyspnea on exertion, sometimes the patient wakes up during the night gasping  for air but no history of frequent coughs during the night. GI: Heartburn.  GENITOURINARY:  Urinary stress incontinence.   PAST MEDICAL HISTORY:  1.  Hypertension.  2.  Hypothyroidism.  3.  Urinary stress incontinence.  4.  Osteoporosis.  5.  Incontinence.  6.  GERD.   MEDICATION LIST:  1.  Ambien 5 mg q.h.s. p.r.n.  2.  Aspirin 81 mg daily.  3.  Calcium and vitamin D combined b.i.d.  4.  Detrol LA 4 mg daily.  5.  Evista 60 mg daily.  6.  Fosamax 70 mg weekly.  7.  Glucosamine 1200 mg b.i.d.  8.  Hydrochlorothiazide 25 mg p.o. daily.  9.  Synthroid 100 mcg p.o. daily.  10. Tylenol PM q.h.s. p.r.n.   ALLERGIES:  PREMPRO.  No other allergies.   PROCEDURES:  The patient had stool guaiac negative in January 2006.  She  refused to have flexible sigmoidoscopy screening.  In March 2006,  cholesterol 180, LDL 74, HDL 69, triglycerides 376.  An  echocardiogram done  many years ago revealed left ventricular hypertrophy. The patient had  bilateral cataract surgery with implants. Right carpal tunnel release. Has  had carotid Doppler done in June 1999 that was negative. Cardiolite negative  in 2001. Upper GI normal in 2002.   FAMILY HISTORY:  Mother died at 53 years of __________ and father died in  his 9s of unknown cause.   SOCIAL HISTORY:  The patient lives with son and the rest of the family which  consists of two more daughters and grandchildren, all living close to her.  There is no use of alcohol or drugs. No tobacco use.  Contact numbers:  Daughter, [name], cell phone  number, [phone], work [phone]; son, Velva Harman,  [phone]; daughter, Chrys Racer, [phone].   PHYSICAL EXAMINATION:  VITAL SIGNS: Temperature 98.4, heart rate 92,  respiratory rate 20, blood pressure 179/81, O2 saturation 99% on room air.  GENERAL: In no acute distress.  NEUROLOGIC:  On assessment of mental status, the patient is alert and  oriented times three. Good judgment and insight.  HEENT: Mild dry mucous membranes. Pupils equal, round, and reactive to  light. Tympanic membranes clear. Mild __________ airway paresthesias.  VITAL SIGNS: Within normal limits.  NECK: Thyroid not palpable. Neck supple. No adenopathy or masses palpable.  CARDIOVASCULAR: PMI normal. No thrills. S1 and S2. RRR. A 2/6 systolic  ejection murmur. No rubs or gallops.  RESPIRATORY:  Bibasilar coarse crackles, left lower lobe coarse crackles. No  wheezing or rhonchi.  EXTREMITIES: No edema and no cyanosis. Pedal pulses palpable, now warm.  GI: Positive bowel sounds. Abdomen tender to deep palpation in the right  lower quadrant and left lower quadrant. No rebound. No guarding. No  hepatosplenomegaly.  GU: Deferred.  RECTAL EXAM: Performed by ED physician with guaiac positive stool per me. No  hemorrhoids or fissures appreciated.  NEUROLOGIC: Cranial nerves II-XII intact. Patient  alert and oriented times  three. Deep tendon reflexes are 2+. Gait is not explored.   LABORATORY DATA:  White blood cell count 13.2, hemoglobin 12.8, hematocrit  37.5, platelet count 189,000, neutrophils 79% with absolute neutrophil count  of 10.4, lymphocytes 13%. Urinalysis reveals hemoglobin moderate, leukocyte  esterase moderate, nitrite negative, epithelial cells a few, bacteria a few,  occult blood positive. Sodium 134, potassium 3.4, chloride 103, BUN 19,  creatinine 1, glucose 117, AST 22, ALT 13, alkaline phosphatase 55, and  total bilirubin 0.7.   ASSESSMENT:  Ms. Nermeen Wiles is an 77 year old white female with bloody  diarrhea, mild hypertension, hypothyroidism.   PLAN:  1.  Crampy lower abdominal pain with bloody diarrhea in this 77 year old      patient. Possible diagnosis will be: (1) Diverticulitis or      diverticulosis since his characteristic crampy abdominal pain with      nausea and vomiting.  The patient is afebrile at this moment..  2.  Colitis. Patient with nausea and diarrhea. Maybe bacterial, __________,      E. coli., helicobacter but no history of traveling or sick contacts. No      history of eating out. No recent antibiotic therapy but will check CT.  3.  Associated colonic polyp, arteriovenous malformation. There is no frank      bleeding, but bloody mucous stool points more towards mucosal      inflammation.  4.  Colonic cancer. No __________ presentation at this age, but there is      always the possibility that the patient may need further workup and      colonoscopy.  5.  Hypertension. Increased blood pressure. The patient in no acute      distress. Will continue hydrochlorothiazide and follow vital signs. If      not well controlled may need to have possible ACE inhibitors. Will also      follow creatinine which is now within normal limits.  6.  Hyperkalemia. Will replace with K-Dur 40 mEq p.o. x1 and recheck in the      morning. 7.  Hypothyroidism.  Will continue Synthroid and check TSH.  8.  Insomnia with __________ during the night.  Possible __________ with      Protonix 40 mg p.o. b.i.d.  9.  Bibasilar crackles, left more than right side, with positive history of      gasping for air overnight. Will check chest x-ray and also since the      presence of murmur, will check an echocardiogram in the morning.  IM/MEDQ  D:  09/30/2004  T:  09/30/2004  Job:  [id number]

## 2010-09-17 NOTE — Consult Note (Signed)
NAME:  Jordan [name], Jordan [name]                    ACCOUNT NO.:  [id number]   MEDICAL RECORD NO.:  [id number]          PATIENT TYPE:  OUT   LOCATION:  GYN                          FACILITY:  [facility]   PHYSICIAN:  Marti Sleigh, M.D.DATE OF BIRTH:  Oct 11, 1919   DATE OF CONSULTATION:  DATE OF DISCHARGE:                                   CONSULTATION   CHIEF COMPLAINT:  Ovarian cyst.   An 77 year old white female seen in consultation at the request of Dr.  Billey Chang regarding management of an evolving adnexal mass.   The patient was first noted to have an adnexal mass during workup for  diarrhea and diverticular disease in May, 2006.  At that time, the cyst was  found to be 3.9 cm on CT scan.  There were calcifications in the uterus.  Ultrasound at the same time showed the right ovarian cyst to measure 4.7 x  3.6 x 2.7 cm, and there were some septations associated with one wall.  There was also an 11 mm cyst in the left ovary.  Subsequently, the patient  had a repeat scan on January 12, 2005, which showed that the cyst had  changed in character and slightly increased in size.  This measures 5.3 x  4.1 x 4.4 cm.  In addition to septations, the cyst now has nodularity in the  posterior aspect.  The relatively simple cyst in the left ovary measures 1.2  x 1.2 x 1.1 cm.  It is noted that the patient also has uterine fibroids.  The patient herself is relatively asymptomatic.  She does have some pelvic  pressure, which she has noted for a number of years and is associated with  uterine prolapse.   PAST MEDICAL HISTORY/MEDICAL ILLNESSES:  1.  Hypertension.  2.  Diverticular disease with bleeding in May, 2006.  3.  Hypothyroidism.  4.  Osteoporosis.  5.  Osteoarthritis.  6.  Urge incontinence.   PAST SURGICAL HISTORY:  1.  Cesarean section.  2.  Carpal tunnel release.  3.  Cataract surgery.   OBSTETRICAL HISTORY:  Gravida 4.   DRUG ALLERGIES:  None.   CURRENT MEDICATIONS:  Synthroid,  Fosamax, Tylenol, hydrochlorothiazide,  Evista, Detrol, Glucosamine.   FAMILY HISTORY:  The patient has a daughter with breast cancer.   REVIEW OF SYSTEMS:  A 10-point comprehensive review of systems is performed  and is negative, except as noted above.   PHYSICAL EXAMINATION:  VITAL SIGNS:  Height 5 foot 2.  Weight 141 pounds.  Blood pressure 145/70, pulse 72, respirations 18.  GENERAL:  The patient is a healthy elderly Hispanic female in no acute  distress.  HEENT:  Negative.  NECK:  Supple without thyromegaly.  There is no supraclavicular, axillary,  or inguinal adenopathy.  ABDOMEN:  Soft and nontender.  No mass, organomegaly, ascites, or hernias  are noted.  She has a low midline incision which is well healed.  PELVIC:  EG/BUS, vagina, bladder, and urethra are normal but atrophic.  There is some cervical prolapse to the introitus.  The cervix appears  normal.  The uterus is anterior and normal in shape, size, and consistency.  Adnexa are without masses.  Rectovaginal exam confirms.  EXTREMITIES:  Lower extremities are without edema and varicosities.   IMPRESSION:  A complex pelvic mass which has enlarged and changed in nature  over three months of followup.   I would recommend that patient undergo exploratory laparotomy with total  abdominal hysterectomy and bilateral salpingo-oophorectomy with  intraoperative frozen section for analysis of this mass.  If she does have a  malignancy, then going ahead with surgical staging at the same time would be  recommended.  I had a lengthy discussion with the patient and her daughter.  It is noted the daughter served as an interpreter throughout this visit and  did an excellent job.  We also discussed this with the patient's son-in-law.  The risks of surgery, including hemorrhage, infection, injury to adjacent  viscera, and cardiovascular complications were all outlined.  They  understand there is somewhat increased risk because of her  age, but on the  other hand, she seems to be in actually good health, given her age.  We will  go ahead and schedule surgery.  The patient would like to have Dr. Baltazar Najjar assist with surgery, given that he has seen her previously as a  gynecology patient.      Marti Sleigh, M.D.  Electronically Signed     DC/MEDQ  D:  01/25/2005  T:  01/25/2005  Job:  [id number]   cc:   Caswell Corwin, R.N.  [address]  [place], [place] [address]   Billey Chang, M.D.  Fax: [phone]

## 2010-09-17 NOTE — Consult Note (Signed)
NAME:  Jordan [name], Jordan [name]                    ACCOUNT NO.:  [id number]   MEDICAL RECORD NO.:  [id number]          PATIENT TYPE:  OUT   LOCATION:  GYN                          FACILITY:  [facility]   PHYSICIAN:  Marti Sleigh, M.D.DATE OF BIRTH:  [birth year]   DATE OF CONSULTATION:  04/19/2005  DATE OF DISCHARGE:  04/19/2005                                   CONSULTATION   CHIEF COMPLAINT:  Postoperative followup.   INTERVAL HISTORY:  Since hospital discharge, the patient's done reasonably  well. She has a number of different complaints which basically preceded  surgery including back pain, some leg and knee pain. She denies any GI or GU  symptoms, has no pelvic pain, pressure, vaginal bleeding or discharge. Her  functional status has returned to approximately what it was prior to  surgery.   HISTORY OF PRESENT ILLNESS:  The patient underwent surgery for a complex  pelvic mass on November 14 including a TAH/BSO. The right ovary was a 7 cm  serous cystadenoma with calcifications. The uterus and left ovary were  otherwise normal. The patient has had an uncomplicated postoperative course.   PHYSICAL EXAMINATION:  VITAL SIGNS:  Weight 136.5 pounds, blood pressure  136/60, pulse 80, respiratory rate 20.  GENERAL:  The patient is a healthy elderly Hispanic female in no acute  distress.  HEENT:  Negative.  NECK:  Supple without thyromegaly. There was no supraclavicular or inguinal  adenopathy.  ABDOMEN:  Soft, nontender, no masses, organomegaly, ascites or hernias are  noted.  Her Pfannenstiel incision is well healed.  PELVIC:  EGBUS, vagina, bladder, urethra are normal but atrophic.  Cervix  and uterus are surgically absent. Bimanual and rectovaginal exam reveal no  masses, induration or nodularity.   IMPRESSION:  Good postoperative recovery. The patient was given the okay to  return to full levels of activity. I have reviewed the patient's pathology  report with the patient and her daughter  and reiterated that she had benign  disease in the ovary. We will return her to the care of her primary  physician, Billey Chang, MD, but would be happy to see her in the future if  necessary.      Marti Sleigh, M.D.  Electronically Signed     DC/MEDQ  D:  04/19/2005  T:  04/21/2005  Job:  [id number]   cc:   Billey Chang, M.D.  Fax: [phone]

## 2010-09-17 NOTE — Op Note (Signed)
[name], [name]                    ACCOUNT NO.:  [id number]   MEDICAL RECORD NO.:  [id number]          PATIENT TYPE:  INP   LOCATION:  [id number]                         FACILITY:  [facility]   PHYSICIAN:  Nelwyn Salisbury, M.D.  DATE OF BIRTH:  1919/08/01   DATE OF PROCEDURE:  09/29/2004  DATE OF DISCHARGE:                                 OPERATIVE REPORT   PROCEDURE PERFORMED:  Colonoscopy with snare polypectomy x2.   ENDOSCOPIST:  Nelwyn Salisbury, M.D.   INSTRUMENT USED:  Olympus video colonoscope.   INDICATION FOR PROCEDURE:  [age]-year-old Jordan [name] female with  history of blood in stool and diarrhea, rule out colon polyps, masses, etc.   PREPROCEDURE PREPARATION:  Informed consent was procured from the patient.  The patient was fasted for 8 hours prior to the procedure and prepped with a  bottle of magnesium citrate and a gallon of GoLYTELY the night prior to the  procedure.  The risks and benefits of the procedure including a 10% miss  rate of cancer in polyps were discussed with the patient as well.  The  patient does not speak much English and therefore the consent was procured  after discussion with her in great detail through an interpreter, our  technician, [place].   PREPROCEDURE PHYSICAL:  VITAL SIGNS:  The patient had stable vital signs.  NECK:  Supple.  CHEST:  Chest clear to auscultation.  S1 and S2 regular.  ABDOMEN:  Abdomen soft with normal bowel sounds.   DESCRIPTION OF PROCEDURE:  The patient was placed in the left lateral  decubitus position and sedated with 50 mg of Demerol and 5 mg of Versed in  slow incremental doses.  Once the patient was adequately sedated and  maintained on low-flow oxygen and continuous cardiac monitoring, the Olympus  video colonoscope was advanced to the rectum to the cecum with difficulty.  The patient had extensive left-sided diverticulosis and the adult scope had  to be withdrawn and the pediatric adjustable scope used instead.   An  elongated polyp was snared from 20 cm and another small polyp snared from  the rectum; the polyp from the rectum was removed by a cold snare.  There  was evidence of extensive diverticulosis.  A large amount of stool was  present and multiple washings were done.  The transverse colon, right colon,  cecum and terminal ileum appeared healthy and without lesions.   IMPRESSION:  1.  Extensive sigmoid diverticulosis.  2.  Elongated polyp snared from 20 cm (hot snare).  3.  Small sessile polyp removed from the rectum (cold snare).  4.  Normal-appearing transverse colon, right colon and cecum including      terminal ileum.   RECOMMENDATIONS:  1.  Await pathology results.  2.  Avoid nonsteroidals including aspirin for now.  3.  Outpatient followup in the next week or earlier if need be.      JNM/MEDQ  D:  09/29/2004  T:  09/30/2004  Job:  [id number]   cc:   Talbert Cage, M.D.  Fax: [phone]

## 2010-09-17 NOTE — Discharge Summary (Signed)
Jordan [name], Jordan [name]                    ACCOUNT NO.:  [id number]   MEDICAL RECORD NO.:  [id number]          PATIENT TYPE:  INP   LOCATION:  [id number]                         FACILITY:  [facility]   PHYSICIAN:  Talbert Cage, M.D.DATE OF BIRTH:  [birth year]   DATE OF ADMISSION:  09/27/2004  DATE OF DISCHARGE:  09/30/2004                                 DISCHARGE SUMMARY   DISCHARGE DIAGNOSIS:  1.  Bloody diarrhea secondary to viral gastroenteritis and diverticular      bleeding.  2.  Tension headache.  3.  Escherichia coli cystitis.  4.  Hypertension.  5.  Right complex ovarian cyst.  6.  Hypothyroidism.  7.  Osteoporosis.  8.  Osteoarthritis.  9.  Urge incontinence.   DISCHARGE MEDICATIONS:  1.  Ciprofloxacin 250 mg 1 p.o. b.i.d.  2.  Hydrochlorothiazide 25 mg p.o. daily.  3.  Synthroid 100 mcg p.o. daily.  4.  Detrol LA 4 mg p.o. daily.  5.  Glucosamine 1200 mg p.o. b.i.d.  6.  Evista 50 mg p.o. daily.  The patient has been instructed to not take aspirin or Fosamax until follow  up appointment.   FOLLOW UP:  Follow up ultrasound for ovarian mass in three months, follow up  appointment with Dr. Billey Chang at [facility] on Wednesday, June  21, at 11:15 a.m.   HOSPITAL COURSE:  This is an 77 year old female who presented with 10-12  bowel movements with some lower abdominal pain and generally not doing well  and was found to have blood in her stool in the ED.  On CT scan, she was  found to have diverticulosis with no diverticulitis, she was also found to  have a right adnexal cyst.  A pelvic ultrasound revealed a 4 cm complex cyst  in the right ovary as well as air in the uterus of undetermined  significance.  The patient underwent colonoscopy with a polypectomy x 2,  this has been sent to pathology and has not returned at the time of  discharge.  In regards to the ovarian cyst, it is recommended that the  patient follow up in three months with an ultrasound.   Hypertension well controlled on hydrochlorothiazide, no change in  medications.   Hypothyroidism, TSH was normal on Synthroid 100 mg p.o. daily.   Headache:  The patient had a headache while in the hospital and was  diagnosed with tension headache.  Tylenol used for pain relief.   DISCHARGE LABORATORY DATA:  Hemoglobin 12, hematocrit 35.  Sodium 139,  potassium 3.6, chloride 108, CO2 29, glucose 88, BUN 6, creatinine 0.9,  calcium 8.5.  Stool cultures with no colonies, final read not done yet.  CA-  125 8.5.  C. diff negative.  Urine culture greater than 400,000 colonies of  E. coli, no fecal white blood cells.  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   At  the time of discharge, the patient was stable and improving, tolerating  p.o. without problems.      TH/MEDQ  D:  09/30/2004  T:  09/30/2004  Job:  [id number]

## 2010-09-17 NOTE — Discharge Summary (Signed)
NAME:  Jordan [name], Jordan [name]                    ACCOUNT NO.:  [id number]   MEDICAL RECORD NO.:  [id number]          PATIENT TYPE:  INP   LOCATION:  74                         FACILITY:  [facility]   PHYSICIAN:  Charles A. Jodi Mourning, M.D.DATE OF BIRTH:  August 18, 1919   DATE OF ADMISSION:  03/15/2005  DATE OF DISCHARGE:  03/18/2005                                 DISCHARGE SUMMARY   ADMISSION DIAGNOSES:  Adnexal mass.   DISCHARGE DIAGNOSES:  Adnexal mass, status post total abdominal  hysterectomy, bilateral salpingo-oophorectomy.   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   CONDITION ON DISCHARGE:  The patient discharged home in good condition.   REASON FOR ADMISSION:  An 77 year old Hispanic female seen in consultation  by Dr. Carlena Bjornstad for Dr. Billey Chang regarding management of  evolving adnexal mass. The patient was noted to have an adnexal mass during  a workup for diarrhea and diverticular disease in May of 2006. At that time,  the cyst was found to be 3.9 cm on CT scan. There were calcifications in the  uterus. Ultrasound at the same time showed the right ovarian cyst to measure  4.7 x 3.6 x 2.7 cm and there were some septations associated with one wall.  There was also an 11 mm cyst in the left ovary. Subsequently, the patient  had a repeat scan on January 12, 2005 which showed the cyst had changed in  character and slightly increased in size. This measured 5.3 x 4.1 x 4.4 cm.  In addition to septations, the cyst now had nodularity in the posterior  aspect. The relatively simple cyst in the left ovary measured 1.2 x 1.2 x  1.1 cm. It is noted that the patient also has uterine fibroids. The patient  herself is relatively asymptomatic. She does have some pelvic pressure which  she has noted for a number of years and is associated with uterine prolapse.   PAST MEDICAL HISTORY:  1.  Surgery:  Cesarean section, carpal tunnel release, cataract surgery.  2.  Illnesses:   Hypertension, diverticular disease with bleeding in May of      2006, hypothyroidism, osteoporosis, osteoarthritis, urge incontinence.   OBSTETRICAL HISTORY:  Gravida 4.   DRUG ALLERGIES:  None.   PHYSICAL EXAMINATION:  GENERAL:  The patient is a healthy, elderly, Hispanic  female in no acute distress.  VITAL SIGNS:  Afebrile, vital signs are stable.  HEENT:  Negative.  NECK:  Supple without thyromegaly. There is no supraclavicular, axillary or  inguinal adenopathy.  ABDOMEN:  Soft, nontender. No masses, organomegaly, ascites or hernias  noted. She has a low midline incision which is well healed.  PELVIC:  Normal external female genitalia that was atrophic. There is some  cervical prolapse to the introitus and cervix is normal. Uterus is anterior  and normal in shape, size and consistency. The adnexa are without masses or  tenderness. Rectovaginal exam confirms.  EXTREMITIES:  The lower extremities are without edema and varicosities.   IMPRESSION:  Complex pelvic mass which has enlarged and changed in nature  over 3  months of followup.   PLAN:  Recommended that the patient undergo exploratory laparotomy with a  total abdominal hysterectomy and bilateral salpingo-oophorectomy with  intraoperative frozen section for analysis of this mass. If she does have a  malignancy then the plan is to go ahead with surgical staging at the time of  surgery.   ADMITTING LABORATORY VALUES:  Hemoglobin 13.9, hematocrit 40.5, white blood  cell count 13,300, platelets 237,000. Comprehensive metabolic panel was  within normal limits. CA 125 antigen was within normal limits of 10.5.   HOSPITAL COURSE:  The patient underwent a total abdominal hysterectomy and  bilateral salpingo-oophorectomy on March 15, 2005. There were no  intraoperative complications. Surgical findings at the time of exploratory  laparotomy were the omentum was densely adherent to a prior midline incision  to the uterine fundus  and the upper abdomen was otherwise normal. The right  ovary was replaced by approximately 6 cm of cystic and nodular mass which on  frozen section was determined to be a serous cystadenofibroma. The left tube  and ovary and uterus appeared otherwise normal. The patient's postoperative  course was uncomplicated. She was discharged home on postoperative day #3 in  good condition.   DISCHARGE LABORATORY VALUES:  Hemoglobin 11.2, hematocrit 32.6, white blood  cell count 10,000, platelets 190,000.   DISPOSITION:  1.  Medications - continue medications that were taken at home. Vicodin was      prescribed for pain.  2.  Routine written instructions were given for discharge after      hysterectomy.  3.  The patient was given instructions for followup with Dr. Andee Lineman-      Dianah Field at [facility].      Charles A. Jodi Mourning, M.D.  Electronically Signed     CAH/MEDQ  D:  03/18/2005  T:  03/18/2005  Job:  [id number]   cc:   Marti Sleigh, M.D.  [place]  [place]  [place] [address]   Billey Chang, M.D.  Fax: [phone]

## 2010-09-17 NOTE — Op Note (Signed)
NAME:  Jordan, [name]                    ACCOUNT NO.:  [id number]   MEDICAL RECORD NO.:  [id number]          PATIENT TYPE:  INP   LOCATION:  0009                         [facility]ACILITY:  [facility]   PHYSICIAN:  Marti Sleigh, M.D.DATE OF BIRTH:  1920-04-30   DATE OF PROCEDURE:  03/15/2005  DATE OF DISCHARGE:                                 OPERATIVE REPORT   PREOPERATIVE DIAGNOSES:  Complex pelvic mass.   POSTOPERATIVE DIAGNOSES:  Serous cystadenofibroma of the right ovary.   PROCEDURE:  Total abdominal hysterectomy and bilateral salpingo-  oophorectomy.   SURGEON:  Marti Sleigh, M.D.   ASSISTANT:  Clenton Pare. Jodi Mourning, M.D. and Caswell Corwin, R.N.   ANESTHESIA:  General with oral tracheal tube.   ESTIMATED BLOOD LOSS:  150 mL.   SURGICAL FINDINGS:  At the time of exploratory laparotomy, the omentum was  densely adherent to a prior midline incision and to the uterine fundus. The  upper abdomen was otherwise normal. The right ovary was replaced by  approximately a 6 cm cystic and nodular mass which on frozen section was  determined to be a serous cystadenofibroma. The left tube and ovary and  uterus appeared otherwise normal.   DESCRIPTION OF PROCEDURE:  The patient was brought to the operating room and  positioned in [place] with her awake to be sure that she was  comfortable and that her back was not in pain. General anesthesia was then  induced. The anterior abdominal wall, perineum and vagina were prepped with  Betadine and Foley catheter was inserted and the patient was draped. The  abdomen was entered through a Pfannenstiel incision. It was noted that she  had a large diastasis recti and very attenuated fascia. Peritoneal washings  were obtained. The adhesions of the omentum to the anterior abdominal wall  and uterus were lysed with Bovie cautery. Care was taken to avoid injury to  the bowel. The small bowel and colon were packed out of the pelvis and a  Bookwalter retractor was positioned. The uterus was grasped with long Kelly  clamps and the right round ligament incised. The retroperitoneal space on  the right side was opened identifying the vessels and ureter. The ovarian  vessels were skeletonized, clamped, cut, free tied and suture ligated. The  adnexa were then transected from the connection to the uterus and sent for  frozen section with the above noted findings. A similar procedure was  performed on the left side. The bladder flap was advanced through sharp and  blunt dissection. The uterine vessels were skeletonized, clamped, cut and  suture ligated. In a step-wise fashion the paracervical and cardinal  ligaments and vaginal angles were clamped, cut and suture ligated. The  vagina was transected from its connection to the cervix. The vagina was  closed with interrupted figure-of-eight sutures of #0 Vicryl. The pelvis was  irrigated, hemostasis achieved with cautery. The upper abdomen was again  inspected. A bleeding point on the omentum was then clamped and suture  ligated. This was reinspected and found to be hemostatic.   The packs and retractors were  removed and the anterior abdominal wall was  closed in layers, the first being a running #0 PDS reapproximating the  fascia and in the midline the peritoneum which was fused with the fascia in  the  diastasis. The subcutaneous tissue was irrigated, hemostasis achieved with  cautery. The skin was closed with skin staples, dressing was applied. The  patient was awakened from anesthesia and taken to the recovery room in  satisfactory condition. Sponge, needle and instrument counts were correct  x2.      Marti Sleigh, M.D.  Electronically Signed     DC/MEDQ  D:  03/15/2005  T:  03/15/2005  Job:  [id number]   cc:   Juanda Crumble A. Jodi Mourning, M.D.  Fax: [phone]   Billey Chang, M.D.  Fax: [phone]   Caswell Corwin, R.N.  [address], [place] [address]

## 2010-09-22 ENCOUNTER — Other Ambulatory Visit: Payer: Self-pay | Admitting: Family Medicine

## 2010-09-22 DIAGNOSIS — Z1231 Encounter for screening mammogram for malignant neoplasm of breast: Secondary | ICD-10-CM

## 2010-10-01 ENCOUNTER — Ambulatory Visit: Payer: Medicare Other

## 2010-10-06 ENCOUNTER — Ambulatory Visit
Admission: RE | Admit: 2010-10-06 | Discharge: 2010-10-06 | Disposition: A | Discharge status: Home / Self Care | Payer: Medicare Other | Source: Ambulatory Visit | Attending: Family Medicine | Admitting: Family Medicine

## 2010-10-06 DIAGNOSIS — Z1231 Encounter for screening mammogram for malignant neoplasm of breast: Secondary | ICD-10-CM

## 2010-10-18 ENCOUNTER — Encounter: Payer: Self-pay | Admitting: Family Medicine

## 2010-10-18 ENCOUNTER — Ambulatory Visit (INDEPENDENT_AMBULATORY_CARE_PROVIDER_SITE_OTHER): Payer: Medicare Other | Admitting: Family Medicine

## 2010-10-18 ENCOUNTER — Ambulatory Visit (HOSPITAL_COMMUNITY)
Admission: RE | Admit: 2010-10-18 | Discharge: 2010-10-18 | Disposition: A | Discharge status: Home / Self Care | Payer: Medicare Other | Source: Ambulatory Visit | Attending: Family Medicine | Admitting: Family Medicine

## 2010-10-18 ENCOUNTER — Other Ambulatory Visit: Payer: Self-pay

## 2010-10-18 VITALS — BP 122/65 | HR 65 | Temp 97.7°F | Ht 59.0 in | Wt 154.0 lb

## 2010-10-18 DIAGNOSIS — E039 Hypothyroidism, unspecified: Secondary | ICD-10-CM

## 2010-10-18 DIAGNOSIS — I498 Other specified cardiac arrhythmias: Secondary | ICD-10-CM | POA: Insufficient documentation

## 2010-10-18 DIAGNOSIS — R42 Dizziness and giddiness: Secondary | ICD-10-CM | POA: Insufficient documentation

## 2010-10-18 HISTORY — DX: Dizziness and giddiness: R42

## 2010-10-18 LAB — CBC
MCHC: 31.3 g/dL (ref 30.0–36.0)
RDW: 12.7 % (ref 11.5–15.5)

## 2010-10-18 LAB — BASIC METABOLIC PANEL
BUN: 26 mg/dL — ABNORMAL HIGH (ref 6–23)
CO2: 33 mEq/L — ABNORMAL HIGH (ref 19–32)
Calcium: 9.3 mg/dL (ref 8.4–10.5)
Creat: 1.02 mg/dL (ref 0.50–1.10)
Glucose, Bld: 92 mg/dL (ref 70–99)

## 2010-10-18 LAB — TSH: TSH: 2.19 u[IU]/mL (ref 0.350–4.500)

## 2010-10-18 NOTE — Patient Instructions (Signed)
We will check some lab work today. I think that VPB (see below) is causing your dizziness. I am going to make an appointment for you with the Neuro-vestibular Rehab group for working on this dizziness.  Follow up with Dr. Tamala Julian in one week.  Vrtigo postural benigno (VPB) (Benign Positional Vertigo (BPV) El vrtigo es una sensacin de inestabilidad o de movimiento de lo que lo rodea. El vrtigo posicional benigno (VPB) es la causa ms comn de vrtigo. Que sea benigno significa que no tiene no lo produce nada grave. Es Furniture conservator/restorer alteracin en el sistema de equilibrio del odo [place]. Se trata de un problema molesto pero normalmente no es grave. Una infeccin viral o lesin en la cabeza son causas comunes, pero a menudo no se encuentra ninguna causa. Es ms comn a medida que [employer] SNTOMAS Se produce un mareo repentino al mover la cabeza en distintas direcciones. Algunos de los trastornos que ocasiona son:  Prdida del equilibrio  Vmitos   Visin borrosa   Printmaker de vomitar   DIAGNSTICO El profesional podr realizar pruebas especializadas para identificar el problema. INSTRUCCIONES PARA EL CUIDADO DOMICILIARIO  Descanse y consuma una dieta bien balanceada.   Muvase lentamente y no realice movimientos bruscos del cuerpo o la cabeza.   No conduzca ni realice actividades que puedan lesionarlo o lesionar a otros.   Recustese y descanse. Tome precauciones para prevenir cadas.  SOLICITE ATENCIN MDICA DE INMEDIATO SI:  Comienza a sentir dolor de cabeza intenso o duradero.   Presenta vmitos repetidamente.   Presenta una prdida o cambio temporal en la visin.   Nota adormecimiento transitorio de un costado del cuerpo.   Presenta incapacidad temporal para hablar.   Presenta zonas de debilidad transitorias.   Siente debilidad o adormecimiento [employer], los brazos o las piernas.   Siente mareos o dificultad para caminar.   Problemas para articular las  palabras o dificultad para tragar.  ASEGRESE QUE:   Comprende estas instrucciones.   Controlar su enfermedad.   Solicitar ayuda de inmediato si no mejora o empeora.  Document Released: 08/04/2008  [facility] Patient Information 2011 [place].   Benign Positional Vertigo (BPV) Vertigo is a feeling that you are unsteady or that you or your surroundings are moving. Benign positional vertigo (BPV) is the most common form of vertigo. Benign means it does not have a serious cause. It is an upset in the balance system in your middle ear. This is troublesome but usually not serious. A viral infection or head injury are common causes, but often no cause is found. It is more common as we grow older. SYMPTOMS Sudden dizziness happens when you move your head in different directions. Some of the problems that come with this are:  Loss of balance  Throwing up   Blurred vision   Dizziness   Feeling sick to your stomach   DIAGNOSIS Your caregiver may do some specialized testing to prove what is wrong. HOME CARE INSTRUCTIONS  Rest and eat a well balanced diet.   Move slowly and do not make sudden body or head movements.   Do not drive a car or do any activities that could hurt you or others.   Lie down and rest. Take precautions to prevent falls.  SEEK IMMEDIATE MEDICAL CARE IF:  You develop headaches which are severe or lasting.   You develop continued vomiting.   A temporary loss or change of vision appears.   You notice temporary numbness  on one side of your body.   You are temporarily unable to speak.   Temporary areas of weakness develop.   You have weakness or numbness in the face, arms, or legs.   You notice dizziness or difficulty walking.   You experience slurred speech or difficulty swallowing.  MAKE SURE YOU:   Understand these instructions.   Will watch your condition.   Will get help right away if you are not doing well or get worse.  Document  Released: 01/24/2006 Document Re-Released: 08/04/2008 [facility] Patient Information 2011 [place].

## 2010-10-19 ENCOUNTER — Telehealth: Payer: Self-pay | Admitting: Family Medicine

## 2010-10-19 ENCOUNTER — Ambulatory Visit (HOSPITAL_COMMUNITY)
Admission: RE | Admit: 2010-10-19 | Discharge: 2010-10-19 | Disposition: A | Discharge status: Home / Self Care | Payer: Medicare Other | Source: Ambulatory Visit | Attending: Family Medicine | Admitting: Family Medicine

## 2010-10-19 NOTE — Telephone Encounter (Signed)
Will forward to PCP and last MD seen.

## 2010-10-19 NOTE — Telephone Encounter (Signed)
Had another dizzy spell last night and wants to know if there is any kind of medicine she can take to help with the dizzy spells until she can get to therapy

## 2010-10-20 MED ORDER — MECLIZINE HCL 12.5 MG PO TABS: 12.5000 mg | ORAL_TABLET | Freq: Two times a day (BID) | ORAL | Status: DC | PRN

## 2010-10-20 NOTE — Telephone Encounter (Signed)
Prescription for meclizine sent in. Please let her know that we are referring to ENT (see referral - attach clinic visit) and that she should follow up with Dr. Tamala Julian Thedore Mins) in one week.

## 2010-10-20 NOTE — Assessment & Plan Note (Addendum)
Symptoms consistent with peripheral vertigo, likely BPPV (though history of hearing loss on left possibly concerning for vestibulocochlear nerve injury). Will refer to ENT for further management. Will treat with meclizine. Also consider neurovesitbular rehab for balance training etc.   Appears to be true vertigo and not related to presyncope, however given low normal BP, low normal heart rate, will change beta-blocker to every other day dosing - consider wean and discontinue and switch to another antihypertensive e.g. thiazide for stroke prevention in this age group unless pressing indication for beta-blocker.   Will check CBC, TSH (with history of hypothyroid) and basic metabolic panel today as well.  Follow with PCP in one week.   EKG with rate 61, 1st degree AV block, LVH with normal axes, no ST elevation or depression (though machine reads ST elevation in inferior leads and acute MI - reviewed with three faculty members - again no ST elevation on tracing in any leads) , flipped T waves in leads V 4-6

## 2010-10-20 NOTE — Telephone Encounter (Signed)
Left message on vm informing of message from MD.

## 2010-10-20 NOTE — Progress Notes (Signed)
  Subjective:    Patient ID: Jordan [name], female    DOB: Nov 28, 1919, 77 y.o.   MRN: [id number]  HPI  1) Vertigo: Patient with episode of vertigo last night while lying in bed. Turned to lie on her right side and immediately felt like room was spinning. This lasted for a few minutes and gradually subsided. She then tried to sit up and and another episode, this one lasting for less than a minute. Has not had any further vertigo since. She has never had these symptoms before. Was admitted to [facility] for presyncope in March 2012 - she states that these symptoms are completely different from that episode in that in March she felt lightheaded, whereas with this episode she feels vertigitinous. Work up at that time included normal head CT and normal orthostatic blood pressures and EKG with normal sinus rhythm with first-degree heart block - her presyncope was felt to be secondary to her taking an extra dose of her metoprolol on the day of admission. Denies chest pain, palpitations, nausea, emesis, headache, vision change, focal weakness or numbness, URI symptoms, ear pain / pressure, change in medications, generalized weakness, confusion. Reports decreased hearing in left ear x 2 months, but denies tinnitus. Has not taken anything for these vertigo symptoms.    Pertinent past history reviewed   Review of Systems As per HPI     Objective:   Physical Exam  Vitals reviewed. Constitutional: She is oriented to person, place, and time. She appears well-developed and well-nourished. No distress.  HENT:  Head: Normocephalic and atraumatic.  Right Ear: Tympanic membrane, external ear and ear canal normal.  Left Ear: Tympanic membrane, external ear and ear canal normal.  Nose: Nose normal.  Mouth/Throat: Oropharynx is clear and moist. No oropharyngeal exudate.       Hearing grossly normal bilaterally.   Eyes: Conjunctivae and EOM are normal. Pupils are equal, round, and reactive to light.       Bilateral cataracts   Neck: No JVD present.       Neck: pain reproduced with lateral bending and rotation.  No gross deformity. Decreased range of motion with flexion and extension.  Negative Spurlings  Cardiovascular: Normal rate, regular rhythm, normal heart sounds and intact distal pulses.   Pulmonary/Chest: Effort normal and breath sounds normal.  Abdominal: Soft. Bowel sounds are normal.  Neurological: She is alert and oriented to person, place, and time. She has normal reflexes. She displays normal reflexes. No cranial nerve deficit. She exhibits normal muscle tone. Coordination normal.       Vertigo without nystagmus with Dix-Hallpike.   Skin: Skin is warm and dry.          Assessment & Plan:

## 2010-10-25 ENCOUNTER — Telehealth: Payer: Self-pay | Admitting: Family Medicine

## 2010-10-25 ENCOUNTER — Encounter: Payer: Self-pay | Admitting: Family Medicine

## 2010-10-25 ENCOUNTER — Ambulatory Visit (INDEPENDENT_AMBULATORY_CARE_PROVIDER_SITE_OTHER): Payer: Medicare Other | Admitting: Family Medicine

## 2010-10-25 VITALS — BP 117/57 | HR 76 | Temp 97.6°F | Wt 154.0 lb

## 2010-10-25 DIAGNOSIS — G4700 Insomnia, unspecified: Secondary | ICD-10-CM

## 2010-10-25 DIAGNOSIS — R42 Dizziness and giddiness: Secondary | ICD-10-CM

## 2010-10-25 DIAGNOSIS — M542 Cervicalgia: Secondary | ICD-10-CM

## 2010-10-25 DIAGNOSIS — I10 Essential (primary) hypertension: Secondary | ICD-10-CM

## 2010-10-25 MED ORDER — MELOXICAM 15 MG PO TABS: 7.5000 mg | ORAL_TABLET | Freq: Every day | ORAL | Status: DC

## 2010-10-25 MED ORDER — MECLIZINE HCL 12.5 MG PO TABS: 12.5000 mg | ORAL_TABLET | Freq: Every day | ORAL | Status: DC

## 2010-10-25 MED ORDER — TRAZODONE HCL 100 MG PO TABS: 100.0000 mg | ORAL_TABLET | Freq: Every day | ORAL | Status: DC

## 2010-10-25 NOTE — Telephone Encounter (Signed)
Pt was just seen, says in order for pt to get a walking cane she must have a written rx, Fax to [facility] at [phone]

## 2010-10-25 NOTE — Assessment & Plan Note (Signed)
No change in physical exam per Dr. Deforest Hoyles note.  Pt though is doing better, will have her take meclizine at night to see if it helps.  Pt will also stop her metoprolol in case it is due to orthostatics.  Blood pressure has been very well controlled.  Gave pt red flags to look out for and when to seek medical attention. Differential with pt reporting hearing loss could be Mainer's disease or a vestibularitis.  RTC in 2-3 weeks to check on symptoms.  If not better would try prednisone and see if it improves.

## 2010-10-25 NOTE — Patient Instructions (Signed)
Take the meclizine at night only Stop the metoprolol at this time. I will see you again in 2-3 weeks Increase you trazadone to 100mg  nightly

## 2010-10-25 NOTE — Assessment & Plan Note (Signed)
At goal will discontinue medication for now recheck in 2-3 weeks and see how pt does.  If need to start another medication would consider HCTZ to see if it would help with inner ear problem.

## 2010-10-25 NOTE — Telephone Encounter (Signed)
Wrote script and wonderful support staff will fax it soon.

## 2010-10-25 NOTE — Assessment & Plan Note (Signed)
Increases trazadone to 100mg  qhs and see if improve will readdress in 2-3 weeks.

## 2010-10-25 NOTE — Progress Notes (Signed)
  Subjective:    Patient ID: Jordan [name], female    DOB: August 10, 1919, 77 y.o.   MRN: [id number]  HPI   1) Vertigo: Pt is here for f/u on vertigo seen by Dr. Sherilyn Cooter.  Pt has been doing better but now feels a little drowsy from the medication. Still has episodes during the day when the room feels like it is spinning.  These last for a few minutes and gradually subsided.  Work up at that time included normal head CT and normal orthostatic blood pressures and EKG with normal sinus rhythm with first-degree heart block - her presyncope was felt to be secondary to her taking an extra dose of her metoprolol on the day of admission. Denies chest pain, palpitations, nausea, emesis, headache, vision change, focal weakness or numbness, URI symptoms, ear pain / pressure, change in medications, generalized weakness, confusion. Reports decreased hearing in left ear x 2 months, but denies tinnitus. Has been taking the meclizine with mild improvement as stated before but a little drowsy.   Pt also still having trouble falling a sleep wants to know if she can increase her trazadone.   1. Hypertension Blood pressure at home: not checking Blood pressure today: 117/57 Taking Meds:yes Side effects:? Of vertigo ROS: Denies headachenausea, vomiting, chest pain or abdominal pain or shortness of breath.  Pertinent past history reviewed   Review of Systems  As per HPI     Objective:   Physical Exam  Vitals reviewed. Constitutional: She is oriented to person, place, and time. She appears well-developed and well-nourished. No distress.  HENT:  Head: Normocephalic and atraumatic.  Right Ear: Tympanic membrane, external ear and ear canal normal.  Left Ear: Tympanic membrane, external ear and ear canal normal.  Nose: Nose normal.  Mouth/Throat: Oropharynx is clear and moist. No oropharyngeal exudate.       Hearing grossly normal bilaterally.   Eyes: Conjunctivae and EOM are normal. Pupils are equal, round, and  reactive to light.       Bilateral cataracts  Neck: No JVD present.       Neck: pain reproduced with lateral bending and rotation.  No gross deformity. Decreased range of motion with flexion and extension.  Negative Spurlings  Cardiovascular: Normal rate, regular rhythm, normal heart sounds and intact distal pulses.   Pulmonary/Chest: Effort normal and breath sounds normal.  Abdominal: Soft. Bowel sounds are normal.  Neurological: She is alert and oriented to person, place, and time. She has normal reflexes. No cranial nerve deficit. She exhibits normal muscle tone. Coordination normal.       Vertigo without nystagmus with Dix-Hallpike.   Skin: Skin is warm and dry.  No pronator drift         Assessment & Plan:

## 2010-10-25 NOTE — Telephone Encounter (Signed)
Informed Jordan [name] that rx was faxed to number provided.

## 2010-11-16 ENCOUNTER — Encounter: Payer: Self-pay | Admitting: Family Medicine

## 2010-11-16 ENCOUNTER — Ambulatory Visit (INDEPENDENT_AMBULATORY_CARE_PROVIDER_SITE_OTHER): Payer: Medicare Other | Admitting: Family Medicine

## 2010-11-16 VITALS — BP 118/55 | HR 83 | Temp 99.1°F | Ht 59.0 in | Wt 154.6 lb

## 2010-11-16 DIAGNOSIS — R42 Dizziness and giddiness: Secondary | ICD-10-CM

## 2010-11-16 DIAGNOSIS — I10 Essential (primary) hypertension: Secondary | ICD-10-CM

## 2010-11-16 DIAGNOSIS — L0390 Cellulitis, unspecified: Secondary | ICD-10-CM

## 2010-11-16 DIAGNOSIS — G4700 Insomnia, unspecified: Secondary | ICD-10-CM

## 2010-11-16 DIAGNOSIS — G609 Hereditary and idiopathic neuropathy, unspecified: Secondary | ICD-10-CM

## 2010-11-16 LAB — VITAMIN B12: Vitamin B-12: 613 pg/mL (ref 211–911)

## 2010-11-16 MED ORDER — CLINDAMYCIN HCL 300 MG PO CAPS: 300.0000 mg | ORAL_CAPSULE | Freq: Three times a day (TID) | ORAL | Status: DC

## 2010-11-16 NOTE — Assessment & Plan Note (Signed)
Pt has not had B12 checked since March will check, was normal at that time.

## 2010-11-16 NOTE — Patient Instructions (Signed)
It is good to see you Stop the meclizine Do not take any blood pressure medicine either.  I will get a B12 level today to make sure it is normal I will give you antibiotics for the next week to see if your arm is in an infection.  Take 1 pill twice daily for the next 7 days.  If not better in 1 week come back otherwise come back in 3 months.

## 2010-11-16 NOTE — Assessment & Plan Note (Signed)
Will consider resolve at this time, had multiple problems with medication no need to treat anymore.

## 2010-11-16 NOTE — Progress Notes (Signed)
  Subjective:    Patient ID: Jordan [name], female    DOB: [birth year], 77 y.o.   MRN: [id number]  HPI   1) Vertigo: Pt is here for f/u.  Pt was seen by ENT and states that pt has likely BPPV and no need for meclizine at this time, seems to be better since stopping the metoprolol.   Insomnia.   1. Hypertension Blood pressure at home:none Blood pressure today: 118/55 Taking Meds:no Side effects:none now.  ROS: Denies headachenausea, vomiting, chest pain or abdominal pain or shortness of breath.  Pt also has been having some swelling and erythema of the right elbow, been leaning on things while doing household chores. No fever no chills.   Pertinent past history reviewed   Review of Systems  As per HPI     Objective:   Physical Exam  Vitals reviewed. Constitutional: She is oriented to person, place, and time. She appears well-developed and well-nourished. No distress.  HENT:  Head: Normocephalic and atraumatic.  Right Ear: Tympanic membrane, external ear and ear canal normal.  Left Ear: Tympanic membrane, external ear and ear canal normal.  Nose: Nose normal.  Mouth/Throat: Oropharynx is clear and moist. No oropharyngeal exudate.       Hearing grossly normal bilaterally.   Eyes: Conjunctivae and EOM are normal. Pupils are equal, round, and reactive to light.       Bilateral cataracts  Neck: No JVD present.  Cardiovascular: Normal rate, regular rhythm, normal heart sounds and intact distal pulses.   Pulmonary/Chest: Effort normal and breath sounds normal.  Abdominal: Soft. Bowel sounds are normal.  Neurological: She is alert and oriented to person, place, and time. She has normal reflexes. No cranial nerve deficit. She exhibits normal muscle tone. Coordination normal.       Vertigo without nystagmus with Dix-Hallpike.   Skin: Skin is warm and dry.     No pronator drift         Assessment & Plan:

## 2010-11-16 NOTE — Assessment & Plan Note (Signed)
Looks to be early cellulitis, will treat for MRSA and strep, only clinda will cover both well. Will treat for 1 week gave family red flags to look out for.

## 2010-11-16 NOTE — Assessment & Plan Note (Signed)
Continue current treatment since increasing dose does not cause side effect.  Still having trouble falling asleep taking 45 minutes but has had this trouble for some time. Will monitor would like to not add any more medication though.

## 2010-11-18 ENCOUNTER — Telehealth: Payer: Self-pay | Admitting: *Deleted

## 2010-11-18 NOTE — Telephone Encounter (Signed)
Call received from daughter . States she has called her mother and she reports being dizzy . BP at home is 155/80 and a little later 156/87.  She is wondering if Dr. Tamala Julian will start back her BP medication. States she is feeling very bad today . patient thinks her arm is worse.   Advised that we have no available appointments this afternoon but if she feels she needs to be seen today would recommend she go to Urgent Care.  Daughter plans to go over to her home to access mother;'s condition. Appointment scheduled for CC appointment tomorrow AM.   Dr. Tamala Julian will be doing Cross Cover then.Marland Kitchen

## 2010-11-19 ENCOUNTER — Ambulatory Visit: Payer: Medicare Other

## 2010-11-23 ENCOUNTER — Encounter: Payer: Self-pay | Admitting: Family Medicine

## 2010-11-23 ENCOUNTER — Ambulatory Visit (INDEPENDENT_AMBULATORY_CARE_PROVIDER_SITE_OTHER): Payer: Medicare Other | Admitting: Family Medicine

## 2010-11-23 VITALS — BP 137/56 | HR 83 | Temp 98.6°F | Wt 153.4 lb

## 2010-11-23 DIAGNOSIS — L0390 Cellulitis, unspecified: Secondary | ICD-10-CM

## 2010-11-23 DIAGNOSIS — L0291 Cutaneous abscess, unspecified: Secondary | ICD-10-CM

## 2010-11-23 MED ORDER — CLINDAMYCIN HCL 300 MG PO CAPS: 300.0000 mg | ORAL_CAPSULE | Freq: Three times a day (TID) | ORAL | Status: AC

## 2010-11-23 NOTE — Progress Notes (Signed)
  Subjective:    Patient ID: Jordan [name], female    DOB: [birth year], 77 y.o.   MRN: [id number]  HPI  Pt is coming back for follow up on her cellulitis of her right elbow.  The redness is improved but now there is a bump that can be felt and seen under the skin, does not hurt but is warm to touch. Pt denies fever but still has been feeling run down.  Pt denies any diarrhea or any nausea or vomiting.  Pt states still can use arm and still able to d all activities of daily living    Review of Systems See above.     Objective:   Physical Exam    Gen: NAD Right arm-  Pt does have significant decrease in swelling, still warm to touch, has a 1cm in diameter bump right over the ulnar bone at about mid shaft, NT, feels semi solid not a cyst not an abscess likely lipoma.  NVI distally.     Assessment & Plan:

## 2010-11-23 NOTE — Assessment & Plan Note (Signed)
Pt showing improvement but still having some redness and signs of infection, will continue for another 10 days and see again in 2 weeks.  Gave red flags and if pt gets diarrhea due to medication to come back and be evaluated.

## 2010-11-23 NOTE — Patient Instructions (Signed)
I will continue the antibiotics for another 10 days I want to see you again in 2 weeks if not better.

## 2010-12-07 ENCOUNTER — Telehealth: Payer: Self-pay | Admitting: Family Medicine

## 2010-12-07 NOTE — Telephone Encounter (Signed)
Jordan [name] daugher would like to speak to Dr. Tamala Julian.  She is having the same issue with bumps on hr arm, warm to touch.  Jordan [name] is scheduled to come in tomorrow morning but the daughter wants to talk to him about whether he thinks this is truly something she needs to be seen for.

## 2010-12-07 NOTE — Telephone Encounter (Signed)
Called pt daughter back and states she has the same skin changes as before and getting warmer to the touch no fever, will see pt tomorrow. Gave family red flags and when to seek medical attention.

## 2010-12-08 ENCOUNTER — Ambulatory Visit (INDEPENDENT_AMBULATORY_CARE_PROVIDER_SITE_OTHER): Payer: Medicare Other | Admitting: Family Medicine

## 2010-12-08 ENCOUNTER — Encounter: Payer: Self-pay | Admitting: Family Medicine

## 2010-12-08 DIAGNOSIS — L0291 Cutaneous abscess, unspecified: Secondary | ICD-10-CM

## 2010-12-08 DIAGNOSIS — D1779 Benign lipomatous neoplasm of other sites: Secondary | ICD-10-CM

## 2010-12-08 DIAGNOSIS — D1720 Benign lipomatous neoplasm of skin and subcutaneous tissue of unspecified limb: Secondary | ICD-10-CM | POA: Insufficient documentation

## 2010-12-08 DIAGNOSIS — L0390 Cellulitis, unspecified: Secondary | ICD-10-CM

## 2010-12-08 NOTE — Assessment & Plan Note (Signed)
Cellulits cleared up, lipoma not changing in size will continue to monitor and told pt that would leave it unless started changing size or becoming painful.

## 2010-12-08 NOTE — Assessment & Plan Note (Signed)
Resolved

## 2010-12-08 NOTE — Progress Notes (Signed)
  Subjective:    Patient ID: Jordan [name], female    DOB: [birth year], 77 y.o.   MRN: [id number]  HPI Pt is here for f/u of cellulitis of right elbow. Pt daughter called last night stating the arm looked worse.  Pt states it was red and swollen last night but no fever and still much better than it was long ago when it was initial. Here today and states the swelling has gotten much better still has area on forearm that is swollen but really not hurting.  Still able to use arm, and is now done with the two coarses of the clindamycin.    Review of Systems     Objective:   Physical Exam Gen: NAD Right arm- no swelling, no erythema,  has a 1cm in diameter bump right over the ulnar bone at about mid shaft, NT, feels semi solid not a cyst not an abscess likely lipoma. Has not change size. NVI distally.        Assessment & Plan:

## 2010-12-08 NOTE — Patient Instructions (Signed)
verbally

## 2011-02-14 ENCOUNTER — Telehealth: Payer: Self-pay | Admitting: Family Medicine

## 2011-02-14 NOTE — Telephone Encounter (Signed)
I would continue the trazadone and remeron, no other medication would be safe. What medication is she talking about?

## 2011-02-14 NOTE — Telephone Encounter (Signed)
Daughter is calling about her mothers sleeping medicine.  She is taking several medications and she is calling to see if there can be a change to the meds she is taking.

## 2011-02-15 ENCOUNTER — Telehealth: Payer: Self-pay | Admitting: *Deleted

## 2011-02-15 NOTE — Telephone Encounter (Signed)
Pt's daughter wanted to know about increasing sleeping medication (trazadol) if so how much should she give her?Maryruth Eve, Lahoma Crocker

## 2011-02-15 NOTE — Telephone Encounter (Signed)
This has been forwarded to pcp.Jordan [name]

## 2011-02-15 NOTE — Telephone Encounter (Signed)
Can do a full pill of the trazadone.

## 2011-02-15 NOTE — Telephone Encounter (Signed)
LMOVM informing daughter of the above.  Asked that is she has questions about a different med to give [place] a call and clarify which med.    Admin, Please find out which meds she is talking about if she calls back. [name], [name]

## 2011-02-16 NOTE — Telephone Encounter (Signed)
Discussed with patient's daughter about her insomnia. At this time told her to do her nighttime dose in the medications at 8 PM instead of right before bed. Told her at this time do not feel comfortable adding any other sleep medications but she could attempt to try 3 mg of melatonin taking it also at 8 PM patient will be make an appointment with me in the near future and we'll see how this regimen is helping. Discussed proper sleep hygiene as well

## 2011-02-16 NOTE — Telephone Encounter (Signed)
Forwarded to  Pcp:  Spoke with daughter this morning and she stated that she is giving her mother 1 full pill of the trazadone, and she is taking 3 gabapentin and 1 mirtrazapine. Please advise.Marland Kitchen Marland KitchenAudelia Hives [place]

## 2011-03-15 ENCOUNTER — Ambulatory Visit: Payer: Medicare Other | Admitting: Family Medicine

## 2011-04-05 ENCOUNTER — Ambulatory Visit (INDEPENDENT_AMBULATORY_CARE_PROVIDER_SITE_OTHER): Payer: Medicare Other | Admitting: Family Medicine

## 2011-04-05 ENCOUNTER — Encounter: Payer: Self-pay | Admitting: Family Medicine

## 2011-04-05 DIAGNOSIS — I10 Essential (primary) hypertension: Secondary | ICD-10-CM

## 2011-04-05 DIAGNOSIS — M542 Cervicalgia: Secondary | ICD-10-CM

## 2011-04-05 DIAGNOSIS — G609 Hereditary and idiopathic neuropathy, unspecified: Secondary | ICD-10-CM

## 2011-04-05 DIAGNOSIS — R300 Dysuria: Secondary | ICD-10-CM

## 2011-04-05 DIAGNOSIS — G4700 Insomnia, unspecified: Secondary | ICD-10-CM

## 2011-04-05 DIAGNOSIS — E039 Hypothyroidism, unspecified: Secondary | ICD-10-CM

## 2011-04-05 LAB — POCT URINALYSIS DIPSTICK
Protein, UA: NEGATIVE
Spec Grav, UA: 1.015
Urobilinogen, UA: 0.2
pH, UA: 6.5

## 2011-04-05 LAB — BASIC METABOLIC PANEL
BUN: 24 mg/dL — ABNORMAL HIGH (ref 6–23)
Creat: 1.05 mg/dL (ref 0.50–1.10)
Glucose, Bld: 90 mg/dL (ref 70–99)

## 2011-04-05 LAB — POCT UA - MICROSCOPIC ONLY

## 2011-04-05 MED ORDER — GABAPENTIN 300 MG PO CAPS: 300.0000 mg | ORAL_CAPSULE | Freq: Three times a day (TID) | ORAL | Status: DC

## 2011-04-05 NOTE — Patient Instructions (Addendum)
It is so good to see you both. I will call you with the results of your urine as well as her blood tests. I when she to increase her Neurontin to 300 mg 3 times a day. Also a few neck continues to hurt consider getting another injection in the neck. Take 3 mg of melatonin one hour before bedtime. I want to see you again in 3 months time Happy holidays and happy new year!

## 2011-04-05 NOTE — Assessment & Plan Note (Signed)
Once again goal for pt should be less than 160 SBP otherwise pt has trouble with orthostatics and near syncopal episode continue to monitor.

## 2011-04-05 NOTE — Assessment & Plan Note (Signed)
Pt is complaining of some tired ness and fatigue will get value and check from there. Potential need to increase dose, have checked CBC and B12 in past and was normal.

## 2011-04-05 NOTE — Assessment & Plan Note (Signed)
Continues to have problems especially with falling asleep, told to increase melatonin to 3 mg at night and to take at least 1 hour before bedtime.  Pt is also on trazadone and remeron in addition to nuerontin so will need to be cautious.  Discuss potential harm to pt and daughter and they understand and want to continue all medications.

## 2011-04-05 NOTE — Progress Notes (Signed)
  Subjective:    Patient ID: Jordan [name], female    DOB: October 20, 1919, 77 y.o.   MRN: [id number]  HPI Hypothyroidism-  Pt has been feeling tired and bloated more than usual, pt states she is taking her medication, pt though denies hair loss or weight gain but does state she has had a poor appetite for sometime.   Insomnia-  Pt states she still can only get 4 hours of sleep a night, has been taking trazadone, remeron and 1.5 mg of melatonin recently. Pt states she still has trouble falling asleep and does  Not have a hang over in the AM.   Cervicalgia-  Pt has been having some pain mostly on the right side, with radiation down the right arm pt though has been taking the nuerontin 300 at night and 100 during the day which has helped.  Pt though states the pain had gotten much better but now is coming back. Pt has had 2 epidural injections previously and may receive another one.  The last injection was about 9 months ago.  Denies weakness or loss of sensation in the extremity.  Pt states she would not have surgery if she needed it.   Of note pt blood pressure is elevated but when attempt to get tight control pt has had syncopal episodes and balance problems.  Review of Systems As stated in HPI Past medical history, social, surgical and family history all reviewed.     Objective:   Physical Exam Gen: NAD pleasant elderly female Neck:Positive spurling pt is minor decrease in rotation in both directions, pt has full flexion and mild decrease in extension.  CV: RRR no murmur Pul: CTAB Ext: NVI 5/5 strength in all extremities.     Assessment & Plan:

## 2011-04-05 NOTE — Assessment & Plan Note (Signed)
We'll increase her Neurontin to 300 mg 3 times a day. We'll see if this improves patient is also encouraged to see orthopedics for another injection if necessary.

## 2011-04-06 ENCOUNTER — Telehealth: Payer: Self-pay | Admitting: Family Medicine

## 2011-04-06 DIAGNOSIS — R319 Hematuria, unspecified: Secondary | ICD-10-CM

## 2011-04-06 NOTE — Telephone Encounter (Signed)
Called pt daughter and told her all labs look good.  Pt though did have hematuria asymptomatic, would like repeat UA in 1-2 months, will put order in now.

## 2011-04-11 ENCOUNTER — Other Ambulatory Visit: Payer: Self-pay | Admitting: Obstetrics

## 2011-04-20 ENCOUNTER — Other Ambulatory Visit: Payer: Self-pay | Admitting: Family Medicine

## 2011-04-21 NOTE — Telephone Encounter (Signed)
Refill request

## 2011-06-02 DIAGNOSIS — R350 Frequency of micturition: Secondary | ICD-10-CM | POA: Diagnosis not present

## 2011-06-02 DIAGNOSIS — R351 Nocturia: Secondary | ICD-10-CM | POA: Diagnosis not present

## 2011-06-02 DIAGNOSIS — R3915 Urgency of urination: Secondary | ICD-10-CM | POA: Diagnosis not present

## 2011-06-24 ENCOUNTER — Other Ambulatory Visit: Payer: Self-pay | Admitting: Family Medicine

## 2011-06-24 NOTE — Telephone Encounter (Signed)
Refill request

## 2011-07-08 ENCOUNTER — Other Ambulatory Visit: Payer: Self-pay | Admitting: Family Medicine

## 2011-07-08 NOTE — Telephone Encounter (Signed)
Refill request

## 2011-08-05 ENCOUNTER — Other Ambulatory Visit: Payer: Self-pay | Admitting: Family Medicine

## 2011-08-22 ENCOUNTER — Other Ambulatory Visit: Payer: Self-pay | Admitting: Family Medicine

## 2011-08-26 ENCOUNTER — Other Ambulatory Visit: Payer: Self-pay | Admitting: Family Medicine

## 2011-08-26 DIAGNOSIS — Z1231 Encounter for screening mammogram for malignant neoplasm of breast: Secondary | ICD-10-CM

## 2011-10-11 ENCOUNTER — Encounter: Payer: Self-pay | Admitting: Family Medicine

## 2011-10-11 ENCOUNTER — Ambulatory Visit (INDEPENDENT_AMBULATORY_CARE_PROVIDER_SITE_OTHER): Payer: Medicare Other | Admitting: Family Medicine

## 2011-10-11 VITALS — BP 133/63 | HR 79 | Temp 98.1°F | Ht 59.0 in | Wt 153.0 lb

## 2011-10-11 DIAGNOSIS — K432 Incisional hernia without obstruction or gangrene: Secondary | ICD-10-CM

## 2011-10-11 DIAGNOSIS — M810 Age-related osteoporosis without current pathological fracture: Secondary | ICD-10-CM | POA: Diagnosis not present

## 2011-10-11 DIAGNOSIS — R5381 Other malaise: Secondary | ICD-10-CM

## 2011-10-11 DIAGNOSIS — R5383 Other fatigue: Secondary | ICD-10-CM

## 2011-10-11 LAB — COMPREHENSIVE METABOLIC PANEL
AST: 19 U/L (ref 0–37)
Albumin: 3.4 g/dL — ABNORMAL LOW (ref 3.5–5.2)
BUN: 30 mg/dL — ABNORMAL HIGH (ref 6–23)
CO2: 30 mEq/L (ref 19–32)
Calcium: 9.7 mg/dL (ref 8.4–10.5)
Chloride: 103 mEq/L (ref 96–112)
Creat: 1.16 mg/dL — ABNORMAL HIGH (ref 0.50–1.10)
Glucose, Bld: 80 mg/dL (ref 70–99)
Potassium: 4.5 mEq/L (ref 3.5–5.3)

## 2011-10-11 LAB — CBC
HCT: 35.2 % — ABNORMAL LOW (ref 36.0–46.0)
Hemoglobin: 11.8 g/dL — ABNORMAL LOW (ref 12.0–15.0)
MCV: 93.6 fL (ref 78.0–100.0)
RDW: 13.1 % (ref 11.5–15.5)
WBC: 8.3 10*3/uL (ref 4.0–10.5)

## 2011-10-11 LAB — TSH: TSH: 1.611 u[IU]/mL (ref 0.350–4.500)

## 2011-10-11 NOTE — Progress Notes (Signed)
  Subjective:    Patient ID: Jordan [name], female    DOB: 18-Mar-1920, 78 y.o.   MRN: [id number]  HPI Patient presents with daughter. Daughter acted as Optometrist. Patient states that she's been having more fatigue than usual for the last 3 months. She feels a little bit more unstable with walking and is having to use her cane all the time. She has a lot of bodyaches especially in her joints. She also says that she has trouble sleeping at night. She says that she will go to bed at 11, fall sleep around midnight, and wake up at 3 AM. At that time she has trouble falling back asleep and wake up again at 7 and finally get out of bed around 10 AM. She is still eating and drinking well and does not have any trouble with her appetite or her bowel movements. She has not had any fevers and she is not have chest pain or shortness of breath. All of these symptoms have been present for several years but they seem to be worse over last couple months.  Abdominal hernia-patient has had a hernia times several years. This has never gotten swollen or painful. It does seem to be getting larger. She says it is irritating to her what is particularly large. She wears a girdle over that area to hold it in.  Lives with her daughter. Review of Systems    Denies CP, SOB, HA, N/V/D, fever  Objective:   Physical Exam Vital signs reviewed General appearance - alert, well appearing, and in no distress MSK-trace swelling of the right knee with crepitus. Full range of motion. Able to get up out of the chair without assistance. Negative Romberg. Heart - normal rate, regular rhythm, normal S1, S2, no murmurs, rubs, clicks or gallops Chest - clear to auscultation, no wheezes, rales or rhonchi, symmetric air entry, no tachypnea, retractions or cyanosis ABD-soft, reducible hernia on the right lower quadrant. Nontender.     Assessment & Plan:

## 2011-10-11 NOTE — Patient Instructions (Signed)
I would like you to start taking 1000 mg of Tylenol 3 times a day This is a gentle medication that may help with your pain If your pain is better, you may sleep better I am checking some labs today  I am not able to get you an appointment with Dr. Tamala Julian because he is booked until the end of the month I have made you an appointment with Dr. Luberta Mutter who is very nice and speaks Spanish She will be all to go over your labs with you

## 2011-10-11 NOTE — Assessment & Plan Note (Signed)
Advised patient to continue using support for the hernia. Gave red flags. Patient to reconsider surgery if this becomes more painful or bothersome.

## 2011-10-11 NOTE — Assessment & Plan Note (Addendum)
Fatigue, difficulty walking.  Patient with difficulty sleeping, joint pain. Today I checked a vitamin D, cmet, TSH, CBC. I think that her problems are likely multifactorial. She does have some knee pain, likely secondary arthritis. I advised Tylenol thousand milligrams 3 times a day. She is to followup at the end of the week to see if this is helped at all and to review labs. Consider physical therapy for mobility.

## 2011-10-12 LAB — VITAMIN D 25 HYDROXY (VIT D DEFICIENCY, FRACTURES): Vit D, 25-Hydroxy: 70 ng/mL (ref 30–89)

## 2011-10-14 ENCOUNTER — Ambulatory Visit (INDEPENDENT_AMBULATORY_CARE_PROVIDER_SITE_OTHER): Payer: Medicare Other | Admitting: Family Medicine

## 2011-10-14 ENCOUNTER — Encounter: Payer: Self-pay | Admitting: Family Medicine

## 2011-10-14 VITALS — BP 124/61 | HR 75 | Ht 59.0 in | Wt 152.8 lb

## 2011-10-14 DIAGNOSIS — R5381 Other malaise: Secondary | ICD-10-CM | POA: Diagnosis not present

## 2011-10-14 DIAGNOSIS — F329 Major depressive disorder, single episode, unspecified: Secondary | ICD-10-CM

## 2011-10-14 DIAGNOSIS — R5383 Other fatigue: Secondary | ICD-10-CM

## 2011-10-16 NOTE — Assessment & Plan Note (Addendum)
I will start patient on zoloft 25mg  and will reduce Remeron 45 mg to 1/2 tablet at bedtime.  Pt to return in 2 weeks for recheck, if tolerating med changes well, will increase zoloft and consider d/c of remeron.  Pt to return sooner if any new or worsening of symptoms.    Greater than 50% of face time spent in counseling patient.

## 2011-10-16 NOTE — Assessment & Plan Note (Addendum)
All labs reviewed- TSH, CMET, CBC, Vit D- with patient and all WNL.  Minimal improvement in joint pain with tylenol.  PHQ-9 score of 18 when I performed screen. (score of 12 on nursing screen- didn't get history of moving slower than others on intial screen, didn't get history of passive suicidal thoughts- this was + on screen by MD).  Pt not going to therapy currently.  Pt taking remeron 45 mg at bedtime for help with sleep- otherwise not receiving any treatment for depression.  Discussed the importance of treating depression and that this might help with symptoms of fatigue.

## 2011-10-16 NOTE — Progress Notes (Signed)
  Subjective:    Patient ID: Jordan [name], female    DOB: 1919-05-25, 78 y.o.   MRN: [id number]  HPI F/up fatigue: Pt here for f/up on fatigue, seen earlier this week and labs drawn.  Her for lab review and f/up on symptoms.  Long h/o fatigue x 1-2 year, but has been having more fatigue than usual for the last 3 months. She feels a little bit more unstable with walking and is having to use her cane all the time. She has a lot of bodyaches especially in her joints.  Has a long history of difficulty sleeping and this has continued with just minimal help from remeron and trazadone. She says that she will go to bed at 11, fall sleep around midnight, and wake up at 3 AM. At that time she has trouble falling back asleep and wake up again at 7 and finally get out of bed around 10 AM. She is still eating and drinking well and does not have any trouble with her appetite or her bowel movements. Does feel like she moves slower than others. Pt does pray that God "will take her."  And sometimes passively hopes that she will not wake up in the morning.  Pt states that she is not suicidal and doesn't have a plan.  No HI.  No auditory of visual hallucinations. No fever. No chest pain.  No sob.  Mild improvement in knee pain with addition of scheduled tylenol to medication list earlier this week at last visit. No joint redness. No joint swelling.   Smoking status reviewed.   Review of Systems As per above    Objective:   Physical Exam  Constitutional: She appears well-developed and well-nourished.  HENT:  Head: Normocephalic and atraumatic.  Mouth/Throat: Oropharynx is clear and moist.  Eyes: Pupils are equal, round, and reactive to light.  Neck: Normal range of motion.  Cardiovascular: Normal rate, regular rhythm and normal heart sounds.   No murmur heard. Pulmonary/Chest: Effort normal and breath sounds normal. No respiratory distress. She has no wheezes. She has no rales.  Abdominal: Soft. She exhibits no  distension. There is no tenderness. There is no rebound.  Musculoskeletal: She exhibits no edema.  Lymphadenopathy:    She has no cervical adenopathy.  Neurological: She is alert.  Skin: No rash noted.  Psychiatric: She has a normal mood and affect.          Assessment & Plan:

## 2011-10-17 ENCOUNTER — Ambulatory Visit: Payer: Medicare Other

## 2011-10-20 ENCOUNTER — Other Ambulatory Visit: Payer: Self-pay | Admitting: Family Medicine

## 2011-10-21 ENCOUNTER — Other Ambulatory Visit: Payer: Self-pay | Admitting: Family Medicine

## 2011-10-21 ENCOUNTER — Telehealth: Payer: Self-pay | Admitting: Family Medicine

## 2011-10-21 MED ORDER — SERTRALINE HCL 50 MG PO TABS: 25.0000 mg | ORAL_TABLET | Freq: Every day | ORAL | Status: DC

## 2011-10-21 NOTE — Telephone Encounter (Signed)
Please let patient know that I have sent in the Rx- I thought that this was sent in the day of the appointment.  I apologize for this oversight.

## 2011-10-21 NOTE — Telephone Encounter (Signed)
Daughter is calling because the new medication that was suggested still hasn't been prescribed and she wasn't sure if that was still the plan.  She expected a call back from Dr. Luberta Mutter, but has not heard anything yet.

## 2011-10-24 ENCOUNTER — Other Ambulatory Visit: Payer: Self-pay | Admitting: *Deleted

## 2011-10-24 DIAGNOSIS — G609 Hereditary and idiopathic neuropathy, unspecified: Secondary | ICD-10-CM

## 2011-10-24 MED ORDER — GABAPENTIN 300 MG PO CAPS: 300.0000 mg | ORAL_CAPSULE | Freq: Three times a day (TID) | ORAL | Status: DC

## 2011-10-27 ENCOUNTER — Ambulatory Visit (INDEPENDENT_AMBULATORY_CARE_PROVIDER_SITE_OTHER): Payer: Medicare Other | Admitting: Family Medicine

## 2011-10-27 ENCOUNTER — Encounter: Payer: Self-pay | Admitting: Family Medicine

## 2011-10-27 VITALS — BP 160/68 | HR 73 | Ht 59.0 in | Wt 154.0 lb

## 2011-10-27 DIAGNOSIS — F329 Major depressive disorder, single episode, unspecified: Secondary | ICD-10-CM | POA: Diagnosis not present

## 2011-10-27 NOTE — Progress Notes (Signed)
  Subjective:    Patient ID: Jordan [name], female    DOB: 06/16/1919, 78 y.o.   MRN: [id number]  HPI Depression followup: Patient here for followup regarding depression. Patient states she has been taking 25 mg of Zoloft daily x1 week. She already feels better and has more motivation to do things and feels happier. Still having problems with sleeping at nighttime. This is her biggest concern at this time. No increased jitteriness with Zoloft. No dizziness. No syncope. No vision changes. No falls. No palpitations. No GI distress.  Smoking status reviewed.  Interview conducted in [place].   Review of Systems    as per above. Objective:   Physical Exam  Constitutional: She appears well-developed and well-nourished.  Cardiovascular: Normal rate, regular rhythm and normal heart sounds.   No murmur heard. Pulmonary/Chest: Effort normal and breath sounds normal. No respiratory distress. She has no wheezes.  Musculoskeletal: She exhibits no edema.  Skin: No rash noted.  Psychiatric: She has a normal mood and affect.          Assessment & Plan:

## 2011-10-29 NOTE — Assessment & Plan Note (Signed)
Will increase zoloft to 50mg  daily.  Hopefully with treatment of depression and increased activity during the day- pt's sleep issues will improve.  Pt given handout on sleep hygiene.  Pt to return for recheck in 2-3 weeks.  At that time will f/up on depression and sleep issues.  May want to consider d/c of remeron once zoloft is titated up.

## 2011-11-14 ENCOUNTER — Ambulatory Visit
Admission: RE | Admit: 2011-11-14 | Discharge: 2011-11-14 | Disposition: A | Discharge status: Home / Self Care | Payer: Medicare Other | Source: Ambulatory Visit | Attending: Family Medicine | Admitting: Family Medicine

## 2011-11-14 DIAGNOSIS — Z1231 Encounter for screening mammogram for malignant neoplasm of breast: Secondary | ICD-10-CM | POA: Diagnosis not present

## 2011-11-17 ENCOUNTER — Encounter: Payer: Self-pay | Admitting: Family Medicine

## 2011-11-17 ENCOUNTER — Ambulatory Visit (INDEPENDENT_AMBULATORY_CARE_PROVIDER_SITE_OTHER): Payer: Medicare Other | Admitting: Family Medicine

## 2011-11-17 VITALS — BP 170/64 | HR 78 | Ht 59.0 in | Wt 151.0 lb

## 2011-11-17 DIAGNOSIS — F329 Major depressive disorder, single episode, unspecified: Secondary | ICD-10-CM

## 2011-11-17 DIAGNOSIS — F3289 Other specified depressive episodes: Secondary | ICD-10-CM

## 2011-11-17 DIAGNOSIS — G4700 Insomnia, unspecified: Secondary | ICD-10-CM | POA: Diagnosis not present

## 2011-11-17 MED ORDER — SERTRALINE HCL 50 MG PO TABS: 50.0000 mg | ORAL_TABLET | Freq: Every day | ORAL | Status: DC

## 2011-11-17 NOTE — Progress Notes (Signed)
  Subjective:    Patient ID: Jordan [name], female    DOB: 12-Aug-1919, 78 y.o.   MRN: [id number]  HPI Depression followup: Patient states that she has some improvement in her energy but still has days that she feels fatigued. Feels that she has some decrease in her passive suicidal thoughts. They still do occur. Still feels depressed many days. Patient has been taking 25 mg of Zoloft. Did not increase it to 50 mg because of confusion about prescription. Did reread prescription label and increase to 50 mg 4 days ago. Has been tolerating Zoloft well. Still taking half tablet of Remeron 45 mg.  No increasing jitteriness. No worsening insomnia. No active suicidal ideation. No HI.  Insomnia: Patient reports continued problems falling asleep. It takes about one hour to fall asleep. She usually goes to bed around 10:00 after watching her soap operas. Usually in the afternoon and with dinner she drinks soda. Doesn't drink much after 8 PM. Only small amounts of water. Patient states she has to get up 2 times during the night to urinate. Is able to go back to sleep without problem.  Smoking status reviewed.    Review of Systems As per above    Objective:   Physical Exam Constitutional: She appears well-developed and well-nourished.  Cardiovascular: Normal rate, regular rhythm and normal heart sounds.  No murmur heard.  Pulmonary/Chest: Effort normal and breath sounds normal. No respiratory distress. She has no wheezes.  Musculoskeletal: She exhibits no edema.  Skin: No rash noted.  Psychiatric: She has a normal mood and affect.        Assessment & Plan:

## 2011-11-17 NOTE — Assessment & Plan Note (Addendum)
Pt currently doing well on trazadone and remeron.  I also think that pt may sleep better once depression is better treated.  See above.  Biggest problem at this point is problems falling asleep and waking up 2 x to go to bathroom.  Discussed sleep hygiene goals today:  1) no caffeine after noon  2) no fluids after 6pm except for small sips as needed. Need to get fluids in earlier in the day.  3) no TV 30 min before bed- try to do a relaxing activity- lower lights prepare for bed.  Pt states understanding. Pt to return for recheck in 4 weeks.   Visited lasted 25 min and Greater than 50% of face time spent in counseling patient.

## 2011-11-17 NOTE — Patient Instructions (Addendum)
Fatigue and depression: Take zoloft 50mg  (1 tablet ) daily for 2 weeks, then increase to 75mg  (1 1/2 tablets) x 2 weeks.  Return in 4 weeks for recheck.   Sleep: Do not drink soda in the afternoon or night. Do not drink liquids except for a small amount after 6pm - drink liquids earlier in the day.  Turn off TV 54min before bed and do relaxing activity.   Return in 4 weeks for recheck.

## 2011-11-17 NOTE — Assessment & Plan Note (Signed)
Pt has not titrated up zoloft as we discussed at last appointment until the past 4 days.  Pt to stay at 50mg  x 2 weeks.  If tolerating well but still having depression symptoms pt is to titrate up to 75mg . Return in 4 weeks for recheck with new pcp.

## 2011-11-29 DIAGNOSIS — H409 Unspecified glaucoma: Secondary | ICD-10-CM | POA: Diagnosis not present

## 2011-11-29 DIAGNOSIS — H4011X0 Primary open-angle glaucoma, stage unspecified: Secondary | ICD-10-CM | POA: Diagnosis not present

## 2011-12-09 ENCOUNTER — Other Ambulatory Visit: Payer: Self-pay | Admitting: Family Medicine

## 2011-12-09 ENCOUNTER — Other Ambulatory Visit: Payer: Self-pay | Admitting: *Deleted

## 2011-12-09 MED ORDER — RALOXIFENE HCL 60 MG PO TABS: 60.0000 mg | ORAL_TABLET | Freq: Every day | ORAL | Status: DC

## 2011-12-19 ENCOUNTER — Ambulatory Visit (INDEPENDENT_AMBULATORY_CARE_PROVIDER_SITE_OTHER): Payer: Medicare Other | Admitting: Family Medicine

## 2011-12-19 ENCOUNTER — Telehealth: Payer: Self-pay | Admitting: Family Medicine

## 2011-12-19 ENCOUNTER — Encounter: Payer: Self-pay | Admitting: Family Medicine

## 2011-12-19 VITALS — BP 146/68 | HR 71 | Temp 98.1°F | Ht 59.0 in | Wt 149.0 lb

## 2011-12-19 DIAGNOSIS — F329 Major depressive disorder, single episode, unspecified: Secondary | ICD-10-CM

## 2011-12-19 DIAGNOSIS — L258 Unspecified contact dermatitis due to other agents: Secondary | ICD-10-CM | POA: Diagnosis not present

## 2011-12-19 DIAGNOSIS — G4700 Insomnia, unspecified: Secondary | ICD-10-CM

## 2011-12-19 DIAGNOSIS — R259 Unspecified abnormal involuntary movements: Secondary | ICD-10-CM

## 2011-12-19 DIAGNOSIS — L853 Xerosis cutis: Secondary | ICD-10-CM | POA: Insufficient documentation

## 2011-12-19 DIAGNOSIS — G252 Other specified forms of tremor: Secondary | ICD-10-CM | POA: Insufficient documentation

## 2011-12-19 NOTE — Patient Instructions (Signed)
Nice to meet you today. We discussed your tremor, dry skin, depression, and insomnia. It sounds as though you are doing well with regards to your depression. Please continue to take you zoloft and remeron for this.  I would like you to take these at night. For your insomnia, please take a couple of days off from trazadone and see if taking the remeron and zoloft at night helps with your sleep.  If these do not, please continue the trazadone. For your tremor we will follow this up in 6 weeks to see if it has progressed. Please try Dove moisturizing soap and lubriderm lotion for the dry areas on your arms and legs. Thank you for your visit today.

## 2011-12-19 NOTE — Assessment & Plan Note (Signed)
Patient noticed some drying of the skin on her lower legs and arms about one week ago.  Stated they started using a new detergent recently.  Advised patient to return to previous detergent, to use Dove moisturizing body wash, and to use lubriderm lotion after showering to help with this skin irritation.

## 2011-12-19 NOTE — Progress Notes (Signed)
  Subjective:    Patient ID: [name], female    DOB: April 04, 1920, 78 y.o.   MRN: [id number]  HPI Patient presents today with her son with complaint of tremor and red, hot legs.  Tremor: states this began 3 weeks ago. Only occurs in her hands at rest. Is minor and does not bother the patient too much, though her son is concerned about this.  Patient has hypothyroidism and is treated with synthroid.  Her last TSH level was 1.611 on 10/11/11.   Skin dryness: patient states that over the past week she has noticed that her anterior lower extremities have been dry and red.  States she noticed a burning sensation in them several days ago. Son states that they recently changed detergents.  Patient uses a variety of soaps to bathe with.  Does not use moisturizing lotion on arms or legs.  Depression: appears to be doing well with regards to this. No complaints currently. On 75 mg zoloft daily.  States doing well with this medication.  Insomnia: overall patient endorses that this issue is improved. States she still has occasional nights when she can't sleep.  Currently taking trazadone and gabapentin at night.  Taking zoloft and remeron in the morning.  Endorses some fatigue during the day, though has no trouble completing ADLs and son states patient is very active throughout the day.    Review of Systems negative except per HPI     Objective:   Physical Exam  Constitutional: She is oriented to person, place, and time. She appears well-developed and well-nourished.  HENT:  Head: Normocephalic and atraumatic.  Musculoskeletal: She exhibits no edema.  Neurological: She is alert and oriented to person, place, and time. No cranial nerve deficit.       Gait normal with help of cane, no slowing or shuffling of gait.  Tremor appears at rest and consists of minor oscillations, no pill rolling. Finger to nose intact. Normal rapid alternating movements.  5/5 strength in upper extremities bilaterally.  Skin:  Skin is warm and dry.       Anterior portion of bilateral lower extremities exhibit some slight erythema and dryness, no excoriations or vesiculations. Skin on arms appears to be dry as well.  Psychiatric: She has a normal mood and affect. Her behavior is normal.          Assessment & Plan:

## 2011-12-19 NOTE — Assessment & Plan Note (Signed)
Appears to be improving. Asked that patient hold off on taking trazadone for a few nights and to start taking zoloft and remeron at night in place of trazadone.  Want to see if we can decrease the number of medications the patient is on and zoloft and remeron can have sedating properties.  Advised that if this doesn't work, she can go back to taking trazadone at night.

## 2011-12-19 NOTE — Telephone Encounter (Signed)
Is asking to speak to nurse about her mother's visit today

## 2011-12-19 NOTE — Assessment & Plan Note (Addendum)
Patient doing well on current regimen. Will continue zoloft 75 mg daily and remeron.  Asked that they try taking these medications at night to see if this would help with sleep and fatigue.  Patients son asked to have FMLA form filled out as he is the primary care giver for his mother.  Patient typically has 6 physician visits per year and the son has to take off about 24 days per year to provide care to his mother for her depression.

## 2011-12-19 NOTE — Assessment & Plan Note (Signed)
Patient exhibits a fine tremor that is occasionally present in both hands. Negative neurological exam otherwise.  We will see the patient back in clinic in 6 weeks to check on this and see if it is progressing.

## 2011-12-19 NOTE — Telephone Encounter (Signed)
Patient's daughter (caretaker) has questions regarding Jordan [name]'s tremors and what is going to be done about it since she couldn't make it to appointment with her mother today.

## 2011-12-20 NOTE — Telephone Encounter (Signed)
Called to say she is anxious to speak with doctor about her mother.

## 2011-12-20 NOTE — Telephone Encounter (Signed)
Patient's daughter called wanting clarification about yesterdays visit regarding tremor.  Told patient that tremor was mild and the rest of the neurological exam was normal.  Tremor is likely not due to parkinsons as absence of pill rolling tremor, gait changes, and postural instability.  Asked patient to not worry about this at the moment as there is no indication that there is an underlying neurological cause at this time. Discussed possible cause is a medication side effect.  Told daughter we would see her back in 6 weeks to follow-up on this issue.

## 2011-12-21 ENCOUNTER — Other Ambulatory Visit: Payer: Self-pay | Admitting: Family Medicine

## 2011-12-29 ENCOUNTER — Other Ambulatory Visit: Payer: Self-pay | Admitting: *Deleted

## 2011-12-29 MED ORDER — LEVOTHYROXINE SODIUM 112 MCG PO TABS: 112.0000 ug | ORAL_TABLET | Freq: Every day | ORAL | Status: DC

## 2011-12-29 NOTE — Telephone Encounter (Signed)
Refilled patient's synthroid. Patient is to follow-up 6 weeks after her recent office visit on 12/19/11.

## 2012-01-24 ENCOUNTER — Ambulatory Visit (INDEPENDENT_AMBULATORY_CARE_PROVIDER_SITE_OTHER): Payer: Medicare Other | Admitting: Family Medicine

## 2012-01-24 ENCOUNTER — Encounter: Payer: Self-pay | Admitting: Family Medicine

## 2012-01-24 VITALS — BP 125/67 | HR 68 | Temp 98.3°F | Ht 59.0 in | Wt 149.0 lb

## 2012-01-24 DIAGNOSIS — Z23 Encounter for immunization: Secondary | ICD-10-CM

## 2012-01-24 DIAGNOSIS — G4700 Insomnia, unspecified: Secondary | ICD-10-CM

## 2012-01-24 DIAGNOSIS — K117 Disturbances of salivary secretion: Secondary | ICD-10-CM | POA: Diagnosis not present

## 2012-01-24 DIAGNOSIS — R5383 Other fatigue: Secondary | ICD-10-CM | POA: Diagnosis not present

## 2012-01-24 DIAGNOSIS — R5381 Other malaise: Secondary | ICD-10-CM

## 2012-01-24 NOTE — Patient Instructions (Signed)
Nice to see you again. We discussed your fatigue today.  We are going to recommend that you take a nap in the afternoon to make sure you get a total of 8 hours of sleep per day.   We also discussed your dry mouth.  This could be a result of the mirtazipine as well as some dehydration.  You should drink at least 10 glasses of water a day and avoid drinking coffee.  Also, use sour hard candies as these can help produce more saliva. Thank you for your visit.

## 2012-01-24 NOTE — Progress Notes (Signed)
  Subjective:    Patient ID: Jordan [name], female    DOB: [birth year], 78 y.o.   MRN: [id number]  HPI 78 yo female presents in followup for fatigue and dry mouth.  1. Fatigue: patient has been dealing with this for some time.  Currently being treated for depression and insomnia.  States that she feels fatigued all the time, but it is especially worse around 3 pm when she feels tired.  She states at night it usually takes about 45 minutes to fall asleep and then she will sleep for about 3-4 hours prior to waking up to go to the bathroom.  Then she will sleep for another couple of hours.  When she gets up in the morning she feels fatigued.  At her last visit we asked her to start taking her zoloft and remeron at night to decrease the sedating effects during the day and she has done this.  She also recently discontinued her trazodone. With this regimen she states she is sleeping better and she feels that her depression is imporved.  She denies taking naps when she is tired during the day.   2. Dry mouth: complains of very dry mouth. Daughter states that sometimes her mouth is so dry she has difficulty speaking.  Patient states that she only drinks about 4 glasses of water daily. Additionally she drinks one cup of coffee and one cup of soda per day.    Review of Systems negative except per HPI     Objective:   Physical Exam  Constitutional: She is oriented to person, place, and time. She appears well-developed and well-nourished.  HENT:  Head: Normocephalic and atraumatic.       Oropharynx appears minimally dry at time of exam. Dentures in place.  Cardiovascular: Normal rate, regular rhythm and normal heart sounds.   Pulmonary/Chest: Effort normal and breath sounds normal.  Neurological: She is alert and oriented to person, place, and time.  Skin: Skin is warm and dry.          Assessment & Plan:

## 2012-01-24 NOTE — Assessment & Plan Note (Signed)
Patient with complaint of fatigue in setting of depression and not getting an adequate amount of sleep. Plan: discussed with the patient and her daughter that as people age they still need about 8 hours of sleep per night, though this sleep might not come in one 8 hour block.  It is more likely for elderly people to sleep for 3-4 hours before waking up and then sleep for 2-3 more hours, just like this patient.  We advised the patient to try and take a nap when she gets tired in the afternoon to make up for the 2 hour sleep deficit she has on a nightly basis.  Also, discussed that her hypothyroidism could potentially contribute to this, but her last TSH was normal so it was unlikely that this is a contributing factor.  Patient is to follow-up if her fatigue does not improve.

## 2012-01-24 NOTE — Assessment & Plan Note (Signed)
Complaint of dry mouth over the last couple of months. Potentially could be caused by remeron or dehydration given patients poor PO liquid intake. Plan: advised that patient should drink at least 10 glasses of water per day and could use sour hard candies to produce more saliva.  Also discussed remeron as a potential cause and offered to discontinue or cut the dose. Patient refused this as the sleep and mood benefit of remeron outweigh the potential cause of dry mouth.

## 2012-03-27 ENCOUNTER — Other Ambulatory Visit: Payer: Self-pay | Admitting: Family Medicine

## 2012-04-06 ENCOUNTER — Telehealth: Payer: Self-pay | Admitting: Family Medicine

## 2012-04-06 MED ORDER — MIRTAZAPINE 45 MG PO TABS: 45.0000 mg | ORAL_TABLET | Freq: Every day | ORAL | Status: DC

## 2012-04-06 NOTE — Telephone Encounter (Signed)
Patient requesting refill of mirtazapine. This was refilled and sent to pharmacy.

## 2012-05-07 ENCOUNTER — Other Ambulatory Visit: Payer: Self-pay | Admitting: Family Medicine

## 2012-05-09 NOTE — Telephone Encounter (Signed)
Refilled patients zoloft.

## 2012-05-15 DIAGNOSIS — N318 Other neuromuscular dysfunction of bladder: Secondary | ICD-10-CM | POA: Diagnosis not present

## 2012-05-15 DIAGNOSIS — R3915 Urgency of urination: Secondary | ICD-10-CM | POA: Diagnosis not present

## 2012-05-15 DIAGNOSIS — N3020 Other chronic cystitis without hematuria: Secondary | ICD-10-CM | POA: Diagnosis not present

## 2012-05-15 DIAGNOSIS — R351 Nocturia: Secondary | ICD-10-CM | POA: Diagnosis not present

## 2012-06-18 DIAGNOSIS — R3915 Urgency of urination: Secondary | ICD-10-CM | POA: Diagnosis not present

## 2012-06-18 DIAGNOSIS — R351 Nocturia: Secondary | ICD-10-CM | POA: Diagnosis not present

## 2012-06-19 ENCOUNTER — Other Ambulatory Visit: Payer: Self-pay | Admitting: *Deleted

## 2012-06-22 MED ORDER — ALENDRONATE SODIUM 70 MG PO TABS: 70.0000 mg | ORAL_TABLET | ORAL | Status: DC

## 2012-06-22 NOTE — Telephone Encounter (Signed)
Rx called in and pts son informed. [name], [name]

## 2012-06-22 NOTE — Telephone Encounter (Signed)
Refilled patients alendronate. Please call this prescription in and advise patient that is has been refilled. Thanks.

## 2012-06-25 ENCOUNTER — Other Ambulatory Visit: Payer: Self-pay | Admitting: Family Medicine

## 2012-06-26 ENCOUNTER — Other Ambulatory Visit: Payer: Self-pay | Admitting: Family Medicine

## 2012-08-06 ENCOUNTER — Other Ambulatory Visit: Payer: Self-pay | Admitting: *Deleted

## 2012-08-07 DIAGNOSIS — H4011X0 Primary open-angle glaucoma, stage unspecified: Secondary | ICD-10-CM | POA: Diagnosis not present

## 2012-08-07 DIAGNOSIS — H01009 Unspecified blepharitis unspecified eye, unspecified eyelid: Secondary | ICD-10-CM | POA: Diagnosis not present

## 2012-08-07 DIAGNOSIS — H2510 Age-related nuclear cataract, unspecified eye: Secondary | ICD-10-CM | POA: Diagnosis not present

## 2012-08-07 MED ORDER — RALOXIFENE HCL 60 MG PO TABS: ORAL_TABLET | ORAL | Status: DC

## 2012-08-07 NOTE — Telephone Encounter (Signed)
Refilled patients evista.

## 2012-08-22 ENCOUNTER — Other Ambulatory Visit: Payer: Self-pay | Admitting: *Deleted

## 2012-08-24 NOTE — Telephone Encounter (Signed)
Did not refill medication. Patient should have an adequate number of pills from her last refill to last for several more months. Will discuss this with patient and family at visit next week.

## 2012-08-28 ENCOUNTER — Ambulatory Visit (INDEPENDENT_AMBULATORY_CARE_PROVIDER_SITE_OTHER): Payer: Medicare Other | Admitting: Family Medicine

## 2012-08-28 ENCOUNTER — Encounter: Payer: Self-pay | Admitting: Family Medicine

## 2012-08-28 VITALS — BP 128/85 | HR 76 | Temp 97.2°F | Ht 59.0 in | Wt 143.1 lb

## 2012-08-28 DIAGNOSIS — M810 Age-related osteoporosis without current pathological fracture: Secondary | ICD-10-CM | POA: Diagnosis not present

## 2012-08-28 DIAGNOSIS — Z0000 Encounter for general adult medical examination without abnormal findings: Secondary | ICD-10-CM | POA: Diagnosis not present

## 2012-08-28 DIAGNOSIS — Z8679 Personal history of other diseases of the circulatory system: Secondary | ICD-10-CM

## 2012-08-28 DIAGNOSIS — R269 Unspecified abnormalities of gait and mobility: Secondary | ICD-10-CM

## 2012-08-28 DIAGNOSIS — E039 Hypothyroidism, unspecified: Secondary | ICD-10-CM | POA: Diagnosis not present

## 2012-08-28 DIAGNOSIS — G4700 Insomnia, unspecified: Secondary | ICD-10-CM | POA: Diagnosis not present

## 2012-08-28 DIAGNOSIS — I10 Essential (primary) hypertension: Secondary | ICD-10-CM

## 2012-08-28 DIAGNOSIS — R2681 Unsteadiness on feet: Secondary | ICD-10-CM

## 2012-08-28 DIAGNOSIS — Z862 Personal history of diseases of the blood and blood-forming organs and certain disorders involving the immune mechanism: Secondary | ICD-10-CM | POA: Diagnosis not present

## 2012-08-28 DIAGNOSIS — Z8639 Personal history of other endocrine, nutritional and metabolic disease: Secondary | ICD-10-CM

## 2012-08-28 LAB — TSH: TSH: 1.022 u[IU]/mL (ref 0.350–4.500)

## 2012-08-28 LAB — CBC
HCT: 36.5 % (ref 36.0–46.0)
Platelets: 185 10*3/uL (ref 150–400)
RBC: 3.85 MIL/uL — ABNORMAL LOW (ref 3.87–5.11)
RDW: 12.9 % (ref 11.5–15.5)
WBC: 8.2 10*3/uL (ref 4.0–10.5)

## 2012-08-28 LAB — COMPREHENSIVE METABOLIC PANEL
ALT: 13 U/L (ref 0–35)
CO2: 33 mEq/L — ABNORMAL HIGH (ref 19–32)
Calcium: 9 mg/dL (ref 8.4–10.5)
Chloride: 102 mEq/L (ref 96–112)
Creat: 1.26 mg/dL — ABNORMAL HIGH (ref 0.50–1.10)
Glucose, Bld: 90 mg/dL (ref 70–99)
Total Bilirubin: 0.3 mg/dL (ref 0.3–1.2)

## 2012-08-28 LAB — LIPID PANEL
HDL: 61 mg/dL (ref >39)
Total CHOL/HDL Ratio: 3.9 Ratio
Triglycerides: 230 mg/dL — ABNORMAL HIGH (ref <150)

## 2012-08-28 MED ORDER — ALENDRONATE SODIUM 70 MG PO TABS: 70.0000 mg | ORAL_TABLET | ORAL | Status: DC

## 2012-08-28 MED ORDER — TRAZODONE HCL 50 MG PO TABS
25.0000 mg | ORAL_TABLET | Freq: Every evening | ORAL | Status: DC | PRN
Start: 2012-08-28 — End: 2013-01-10

## 2012-08-28 NOTE — Assessment & Plan Note (Signed)
Refilled fosamax. Will check vitamin D.

## 2012-08-28 NOTE — Patient Instructions (Signed)
Nice to see you again.  We will get you an appointment in one week to discuss your shoulder pain. I refilled your fosamax and gave a prescription for trazodone. We will obtain some labs today and will let you know the results of these. We will also schedule you in geriatrics clinic for more comprehensive falls risk assessment.

## 2012-08-28 NOTE — Assessment & Plan Note (Signed)
Patient feels unstable while walking. Potentially could be related to age, MSK decompensation occurring with age, osteoporosis with potential for fracture, or medication. Plan: will refer to geriatrics clinic for further evaluation for falls risk.

## 2012-08-28 NOTE — Assessment & Plan Note (Signed)
Will check TSH today 

## 2012-08-28 NOTE — Progress Notes (Signed)
  Subjective:    Patient ID: Jordan [name], female    DOB: Sep 21, 1919, 79 y.o.   MRN: [id number]  HPI Patient is a 79 yo female who presents for annual exam and wants to discuss walking instability and sleep difficulties.  Instability: states feels unstable when walking. Doesn't use cane at home. Can walk at home without much difficulty as she is able to steady herself on furniture. Has not fallen recently.  Sleep: takes about an hour to fall asleep. Sleeps well once falls asleep, though wakes up ~2x/night to use the restroom. Would like to fall asleep easier. Denies caffeine use after the morning. Watches TV until right before bed. Goes to bed at 11 pm and falls asleep around midnight.  SHx: alcohol and tobacco use noted  Review of Systems  Constitutional: Negative for fever and unexpected weight change.  HENT: Negative for mouth sores, trouble swallowing and tinnitus.   Respiratory: Negative for cough and shortness of breath.   Cardiovascular: Negative for chest pain and palpitations.  Gastrointestinal: Positive for constipation. Negative for nausea, vomiting, abdominal pain and diarrhea.  Genitourinary: Positive for frequency. Negative for dysuria, hematuria and difficulty urinating.  Musculoskeletal: Positive for arthralgias.  Skin: Negative for color change and rash.  Neurological: Positive for numbness and headaches (sometimes). Negative for dizziness, syncope, weakness and light-headedness.  Psychiatric/Behavioral:       Sadness and memory problems        Objective:   Physical Exam  Constitutional: She appears well-developed and well-nourished.  HENT:  Head: Normocephalic and atraumatic.  Right Ear: External ear normal.  Left Ear: External ear normal.  Mouth/Throat: Oropharynx is clear and moist.  Dentures  Eyes: Conjunctivae are normal. Pupils are equal, round, and reactive to light. Right eye exhibits no discharge. Left eye exhibits no discharge.  Neck: Neck supple.   Cardiovascular: Normal rate, regular rhythm and normal heart sounds.  Exam reveals no gallop and no friction rub.   No murmur heard. Pulmonary/Chest: Effort normal and breath sounds normal. No respiratory distress. She has no wheezes. She has no rales. She exhibits no tenderness.  Musculoskeletal: She exhibits no edema.  Lymphadenopathy:    She has no cervical adenopathy.  Neurological: She is alert.  Gait appears normal, though patient occasionally had to steady self without cane. Able to rise from chair with no issues.  Skin: Skin is warm and dry.  Psychiatric: Her behavior is normal.  BP 128/85  Pulse 76  Temp(Src) 97.2 F (36.2 C) (Oral)  Ht 4\' 11"  (1.499 m)  Wt 143 lb 1.6 oz (64.91 kg)  BMI 28.89 kg/m2    Assessment & Plan:

## 2012-08-28 NOTE — Assessment & Plan Note (Signed)
Having difficulty with onset of sleep. Plan: discussed better sleep hygiene with not watching TV right before bed. Will try trazodone for sleep aid.

## 2012-08-28 NOTE — Assessment & Plan Note (Signed)
Stable at goal. Will check CBC, CMET, and lipid panel.

## 2012-08-30 ENCOUNTER — Telehealth: Payer: Self-pay | Admitting: *Deleted

## 2012-08-30 ENCOUNTER — Other Ambulatory Visit: Payer: Self-pay | Admitting: Family Medicine

## 2012-08-30 NOTE — Telephone Encounter (Signed)
Patients daughter called and was given the message from Dr. Caryl Bis.  She is requesting that the lab work be faxed to her at [phone].  She does need to speak to the nurse because she had questions about what the Creatinine level is and about her mom being in a lot of pain.

## 2012-08-30 NOTE — Telephone Encounter (Signed)
Left message for daughter [name] to call back about pt's labs.  Please inform of message from Dr. Caryl Bis.  Thanks [employer], [place]

## 2012-08-30 NOTE — Telephone Encounter (Signed)
Message copied by Andreas Newport on Thu Aug 30, 2012 12:24 PM ------      Message from: Caryl Bis, ERIC G      Created: Thu Aug 30, 2012 12:06 PM       Patient with recent small bump in creatinine. Currently on mobic. Please call patient to advise to stop mobic and advise to stay well hydrated. Will plan on rechecking this lab at next visit. All other labs appear adequate. Thanks. ------

## 2012-08-31 ENCOUNTER — Telehealth: Payer: Self-pay | Admitting: *Deleted

## 2012-08-31 MED ORDER — HYDROCODONE-ACETAMINOPHEN 5-325 MG PO TABS: 1.0000 | ORAL_TABLET | Freq: Four times a day (QID) | ORAL | Status: DC | PRN

## 2012-08-31 NOTE — Telephone Encounter (Signed)
Please call in prescription for vicodin that has been signed. Advise they can take this for pain and we will re-evaluate at her next visit. Advise that this can be sedating to Mrs. Curcuru should be cautious with using this too frequently. Thanks.

## 2012-08-31 NOTE — Telephone Encounter (Signed)
Called and spoke with [name].  Advised that creatine is a part of the lab that tells [place] what her kidneys look like and that the mobic can affect that and that's why the MD wants her to stop the mobic.  [name] wants to know what else can she take for the shoulder pain and hip pain.  Wanted to know if she could take ibuprofen but I told her no because that could affect her kidneys as well.  Advised I would send a message to MD to inquire about what she can take for the pain until her appt on May 13. Quinlee Sciarra, [name]   Also daughter ask that I fax over the labs to her @ [phone].  Labs faxed as requested. Samyia Motter, [name]

## 2012-08-31 NOTE — Telephone Encounter (Signed)
Will forward to nurse 

## 2012-08-31 NOTE — Telephone Encounter (Signed)
Spoke with Jordan [name] and she would like the vicodin called into [facility] on [address].  Rx called in varbally. Charnel Giles, [name]

## 2012-09-03 ENCOUNTER — Other Ambulatory Visit: Payer: Self-pay | Admitting: Family Medicine

## 2012-09-11 ENCOUNTER — Ambulatory Visit (INDEPENDENT_AMBULATORY_CARE_PROVIDER_SITE_OTHER): Payer: Medicare Other | Admitting: Family Medicine

## 2012-09-11 ENCOUNTER — Encounter: Payer: Self-pay | Admitting: Family Medicine

## 2012-09-11 VITALS — BP 106/62 | HR 80 | Ht 64.0 in | Wt 144.0 lb

## 2012-09-11 DIAGNOSIS — N3941 Urge incontinence: Secondary | ICD-10-CM | POA: Diagnosis not present

## 2012-09-11 DIAGNOSIS — M75100 Unspecified rotator cuff tear or rupture of unspecified shoulder, not specified as traumatic: Secondary | ICD-10-CM | POA: Insufficient documentation

## 2012-09-11 DIAGNOSIS — M12819 Other specific arthropathies, not elsewhere classified, unspecified shoulder: Secondary | ICD-10-CM | POA: Insufficient documentation

## 2012-09-11 DIAGNOSIS — M25519 Pain in unspecified shoulder: Secondary | ICD-10-CM

## 2012-09-11 DIAGNOSIS — M25511 Pain in right shoulder: Secondary | ICD-10-CM

## 2012-09-11 HISTORY — DX: Unspecified rotator cuff tear or rupture of unspecified shoulder, not specified as traumatic: M75.100

## 2012-09-11 MED ORDER — TRAMADOL HCL 50 MG PO TABS: 50.0000 mg | ORAL_TABLET | Freq: Three times a day (TID) | ORAL | Status: DC | PRN

## 2012-09-11 MED ORDER — FESOTERODINE FUMARATE ER 4 MG PO TB24: 4.0000 mg | ORAL_TABLET | Freq: Every day | ORAL | Status: DC

## 2012-09-11 NOTE — Patient Instructions (Addendum)
Nice to see you today.  I hope the injection will help with your pain. If your pain is not better by next week, pleas call the sports medicine clinic to set up an appointment with Dr. Georgina Snell to get evaluated. Please call [phone] to set up this appointment. I have also written you for tramadol to help with your pain.  Please call to schedule an appointment with Dr. Jeffie Pollock regarding your urinary frequency. I have given a refill for toviaz, as this has appeared to help you. Please address this with Dr. Jeffie Pollock when your see him.

## 2012-09-13 NOTE — Assessment & Plan Note (Signed)
Rotator cuff tendinitis. Less likely tear.  Plan: steroid joint injection performed. Rx for tramadol given. Advised to f/u with Dr. Georgina Snell in the [facility] for ultrasound if pain not improved in one week.

## 2012-09-13 NOTE — Progress Notes (Signed)
  Subjective:    Patient ID: Jordan [name], female    DOB: 11-27-1919, 79 y.o.   MRN: [id number]  HPI Patient is a 79 yo female presenting for right shoulder pain evaluation.  Right shoulder started hurting on April 29th. Daughter states on the 26th they went for a walk and patient used cane, but had no complaints of injury. Stopped mobic due to renal function. Tried vicodin, though this didn't help. Pain is worse with movement and hurts in shoulder area with radiation to the insertion of the deltoid.  Additionally patient states has had increased urinary frequency, is able to make it to the bathroom. Denies dysuria. This has been an ongoing issue for the patient. Patient follows with Dr. Jeffie Pollock for this. Recently changed medication for this and it has not been helping as much. They were unsure of the name of the medication. Stated [name] had been previous medication and she had been taking what she had left over, and this was helping significantly.  Review of Systems see HPI     Objective:   Physical Exam  Constitutional: She appears well-developed and well-nourished.  HENT:  Head: Normocephalic and atraumatic.  Musculoskeletal:  5/5 strength in bilateral UE, pain at 60 degrees with passive and active abduction, pain with external and internal rotation, no drop arm, full ROM with external  rotation  BP 106/62  Pulse 80  Ht 5\' 4"  (1.626 m)  Wt 144 lb (65.318 kg)  BMI 24.71 kg/m2 Informed consent was obtained. The area was cleaned with alcohol swabs and anesthetic was freezing spray. A steroid injection was performed at subacromial joint using 3 cc 1% plain Lidocaine and 40 mg of depo-medrol. This was well tolerated.     Assessment & Plan:

## 2012-09-13 NOTE — Assessment & Plan Note (Signed)
Patient advised to f/u with Dr. Jeffie Pollock regarding this. Given refill of toviaz 4 mg daily. Stated Dr. Jeffie Pollock would need to be physician to refill this in the future.

## 2012-09-19 ENCOUNTER — Telehealth: Payer: Self-pay | Admitting: Family Medicine

## 2012-09-19 NOTE — Telephone Encounter (Signed)
Number given to [facility]. Kyndall Chaplin, [name]

## 2012-09-19 NOTE — Telephone Encounter (Signed)
Jordan [name] called back. Message was given.  She requested the phone number to Sports medicine

## 2012-09-19 NOTE — Telephone Encounter (Signed)
LMOVM for pt to return call.  Sports medicine more treats acute (new,short term) injuries and Pain clinic is more chronic (longterm) pain.  Hopefully Mrs. Mcphatter shoulder will not get to this point and at this time just SM is needed if pt is still having pain.  Pt was given this info at last visit to call and set up appt.Clinton Sawyer, [name]

## 2012-09-19 NOTE — Telephone Encounter (Signed)
The patients daughter has some questions for Dr. Caryl Bis about the difference of Sports Medicine and Pain Management.

## 2012-09-19 NOTE — Telephone Encounter (Signed)
Spoke with patients daughter, Jordan [name] regarding her mother's shoulder pain.  Jordan [name] was wanting to know what exactly Dr. Georgina Snell could do for her mother that hasn't already been done.  Jordan [name] states she(Jordan [name]) has seen pain management before and that has helped.  Daughter is willing to take her 79 year old mother to doctor appts, but it does require her to take off work and her mother gets real tired, do Jordan [name] would really like to discuss with Dr. Caryl Bis.  Will FWD msg to MD.  Lazaro Arms, CMA

## 2012-09-23 ENCOUNTER — Encounter (HOSPITAL_COMMUNITY): Payer: Self-pay | Admitting: *Deleted

## 2012-09-23 ENCOUNTER — Emergency Department (INDEPENDENT_AMBULATORY_CARE_PROVIDER_SITE_OTHER)
Admission: EM | Admit: 2012-09-23 | Discharge: 2012-09-23 | Disposition: A | Discharge status: Home / Self Care | Payer: Medicare Other | Source: Home / Self Care

## 2012-09-23 ENCOUNTER — Emergency Department (INDEPENDENT_AMBULATORY_CARE_PROVIDER_SITE_OTHER): Payer: Medicare Other

## 2012-09-23 DIAGNOSIS — N390 Urinary tract infection, site not specified: Secondary | ICD-10-CM

## 2012-09-23 DIAGNOSIS — R109 Unspecified abdominal pain: Secondary | ICD-10-CM | POA: Diagnosis not present

## 2012-09-23 DIAGNOSIS — K5900 Constipation, unspecified: Secondary | ICD-10-CM

## 2012-09-23 DIAGNOSIS — M25519 Pain in unspecified shoulder: Secondary | ICD-10-CM

## 2012-09-23 DIAGNOSIS — M25511 Pain in right shoulder: Secondary | ICD-10-CM

## 2012-09-23 HISTORY — DX: Unspecified osteoarthritis, unspecified site: M19.90

## 2012-09-23 LAB — POCT URINALYSIS DIP (DEVICE)
Glucose, UA: NEGATIVE mg/dL
Nitrite: NEGATIVE
Urobilinogen, UA: 0.2 mg/dL (ref 0.0–1.0)

## 2012-09-23 MED ORDER — DICLOFENAC SODIUM 1 % TD GEL: 2.0000 g | Freq: Four times a day (QID) | TRANSDERMAL | Status: DC

## 2012-09-23 MED ORDER — CIPROFLOXACIN HCL 500 MG PO TABS: 500.0000 mg | ORAL_TABLET | Freq: Two times a day (BID) | ORAL | Status: DC

## 2012-09-23 MED ORDER — DOCUSATE SODIUM 100 MG PO CAPS: 200.0000 mg | ORAL_CAPSULE | Freq: Two times a day (BID) | ORAL | Status: DC

## 2012-09-23 MED ORDER — POLYETHYLENE GLYCOL 3350 17 G PO PACK: 17.0000 g | PACK | Freq: Every day | ORAL | Status: DC

## 2012-09-23 MED ORDER — ACETAMINOPHEN 325 MG PO TABS: 650.0000 mg | ORAL_TABLET | Freq: Four times a day (QID) | ORAL | Status: DC | PRN

## 2012-09-23 NOTE — ED Provider Notes (Signed)
History     CSN: [id number]  Arrival date & time 09/23/12  1611   First MD Initiated Contact with Patient 09/23/12 1640      Chief Complaint  Patient presents with  . Back Pain  . Shoulder Pain    (Consider location/radiation/quality/duration/timing/severity/associated sxs/prior treatment) Patient is a 79 y.o. female presenting with back pain, shoulder pain, frequency, and constipation. The history is provided by the patient and a caregiver. The history is limited by a language barrier.  Back Pain Location:  Lumbar spine and sacro-iliac joint Quality:  Aching Radiates to:  Does not radiate Pain severity:  Mild Pain is:  Unable to specify Onset quality:  Unable to specify Duration:  4 days Timing:  Intermittent Progression:  Unchanged Chronicity: states was taken off of her mobic for her OA pain due to an increase in her creatinine.  pain has been worsening since then.  also c/o urinary frequency but states she always has that and is on meds .  did have incontinence this am which is unusual for her. Context: not emotional stress, not falling, not jumping from heights and not lifting heavy objects   Relieved by:  None tried Ineffective treatments:  None tried Associated symptoms: no abdominal pain, no abdominal swelling, no chest pain, no dysuria, no fever, no headaches, no leg pain, no numbness, no paresthesias, no pelvic pain and no weakness   Shoulder Pain This is a chronic (frustrated that she has ongoing shoulder pain that keeps her up and wants a joint injection today.  her mobic recently stopped and pain got worse) problem. The current episode started more than 1 week ago. The problem occurs daily. The problem has not changed since onset.Pertinent negatives include no chest pain, no abdominal pain, no headaches and no shortness of breath. Exacerbated by: sleeping in the right side at night. The symptoms are relieved by NSAIDs. The treatment provided moderate relief.  Urinary  Frequency This is a chronic (incontinence new today, no fever chills, n or v.) problem. Pertinent negatives include no chest pain, no abdominal pain, no headaches and no shortness of breath.  Constipation Severity:  Moderate Time since last bowel movement:  3 days Timing:  Intermittent Context: not dehydration and not dietary changes   Stool description:  Hard Unusual stool frequency:  Usually every toher day but none in 3-4 days Worsened by:  Nothing tried Associated symptoms: back pain   Associated symptoms: no abdominal pain, no dysuria and no fever     Past Medical History  Diagnosis Date  . Lung mass     right apical need CT in end of 2012  . Cervicalgia   . Peripheral neuropathy   . Frailty   . Hypertension   . Hypothyroid   . Osteoporosis   . Insomnia   . Arthritis     No past surgical history on file.  Family History  Problem Relation Age of Onset  . Kidney disease Mother     History  Substance Use Topics  . Smoking status: Never Smoker   . Smokeless tobacco: Never Used  . Alcohol Use: No    OB History   Grav Para Term Preterm Abortions TAB SAB Ect Mult Living                  Review of Systems  Constitutional: Negative for fever.  Respiratory: Negative for shortness of breath.   Cardiovascular: Negative for chest pain.  Gastrointestinal: Positive for constipation. Negative for abdominal pain.  Genitourinary: Positive for frequency. Negative for dysuria and pelvic pain.  Musculoskeletal: Positive for back pain.  Neurological: Negative for weakness, numbness, headaches and paresthesias.  All other systems reviewed and are negative.    Allergies  Review of patient's allergies indicates no known allergies.  Home Medications   Current Outpatient Rx  Name  Route  Sig  Dispense  Refill  . acetaminophen (TYLENOL) 650 MG CR tablet   Oral   Take 650 mg by mouth every 6 (six) hours.           Marland Kitchen alendronate (FOSAMAX) 70 MG tablet      take 1  tablet by mouth every week ON TUESDAYS on an empty stomach with 6-8 oz of water. No food / meds for 30 min. Remain Upright   4 tablet   0   . Calcium Carbonate-Vitamin D (CALTRATE 600+D) 600-400 MG-UNIT per tablet   Oral   Take 1 tablet by mouth 2 (two) times daily.           . fesoterodine (TOVIAZ) 4 MG TB24   Oral   Take 1 tablet (4 mg total) by mouth daily.   30 tablet   0   . gabapentin (NEURONTIN) 300 MG capsule   Oral   Take 1 capsule (300 mg total) by mouth 3 (three) times daily.   270 capsule   2   . Glucosamine-Chondroitin-Vit D3 1500-1200-800 MG-MG-UNIT PACK   Oral   Take 1 tablet by mouth 2 (two) times daily.           Marland Kitchen levothyroxine (SYNTHROID, LEVOTHROID) 112 MCG tablet      take 1 tablet by mouth once daily   90 tablet   1   . LIDODERM 5 %      APPLY 1 PATCH TO SKIN DAILY. MAY CUT PATCH AND USE 1/4 OR 1/2 OF PATCH INSTEAD   90 each   1   . mirtazapine (REMERON) 45 MG tablet   Oral   Take 1 tablet (45 mg total) by mouth at bedtime.   90 tablet   3   . Multiple Vitamin (MULTIVITAMIN) tablet   Oral   Take 1 tablet by mouth daily.           . raloxifene (EVISTA) 60 MG tablet      take 1 tablet by mouth once daily   60 tablet   1   . sertraline (ZOLOFT) 50 MG tablet      take 1 and 1/2 tablets (75 MG) by mouth once daily   45 tablet   3   . traMADol (ULTRAM) 50 MG tablet   Oral   Take 1 tablet (50 mg total) by mouth every 8 (eight) hours as needed for pain.   30 tablet   1   . traZODone (DESYREL) 50 MG tablet   Oral   Take 0.5-1 tablets (25-50 mg total) by mouth at bedtime as needed for sleep.   30 tablet   2   . acetaminophen (TYLENOL) 325 MG tablet   Oral   Take 2 tablets (650 mg total) by mouth every 6 (six) hours as needed for pain.         Marland Kitchen aspirin 81 MG tablet   Oral   Take 81 mg by mouth daily.           . ciprofloxacin (CIPRO) 500 MG tablet   Oral   Take 1 tablet (500 mg total) by mouth every 12 (twelve)  hours.   6 tablet   0   . diclofenac sodium (VOLTAREN) 1 % GEL   Topical   Apply 2 g topically 4 (four) times daily.   10 Tube   1   . docusate sodium (COLACE) 100 MG capsule   Oral   Take 2 capsules (200 mg total) by mouth every 12 (twelve) hours.   60 capsule   0   . fish oil-omega-3 fatty acids 1000 MG capsule   Oral   Take 2 g by mouth daily.           Marland Kitchen HYDROcodone-acetaminophen (NORCO/VICODIN) 5-325 MG per tablet   Oral   Take 1 tablet by mouth every 6 (six) hours as needed for pain.   30 tablet   0   . meloxicam (MOBIC) 15 MG tablet      take 1/2 tablet by mouth once daily with food (STOP TAKING IF YOU START GETTING STOMACH PAIN.)   45 tablet   2   . polyethylene glycol (MIRALAX) packet   Oral   Take 17 g by mouth daily.   14 each   0     BP 132/60  Pulse 79  Temp(Src) 98.5 F (36.9 C) (Oral)  Resp 12  SpO2 100%  Physical Exam  Nursing note and vitals reviewed. Constitutional: She is oriented to person, place, and time. She appears well-developed and well-nourished. No distress.  HENT:  Head: Normocephalic and atraumatic.  Eyes: Conjunctivae are normal. Pupils are equal, round, and reactive to light.  Neck: Normal range of motion. Neck supple.  Cardiovascular: Normal rate and regular rhythm.   Pulmonary/Chest: Effort normal and breath sounds normal.  Abdominal: Soft. She exhibits no distension and no mass. There is no tenderness. There is no rebound and no guarding.  Genitourinary:  Mild SP TTP, no CVA TTP  Musculoskeletal: Normal range of motion.       Right shoulder: She exhibits pain (pain with overhead movments.). She exhibits normal range of motion, no tenderness, no bony tenderness, no swelling, no effusion, no crepitus, normal pulse and normal strength.  Neurological: She is alert and oriented to person, place, and time.  Skin: Skin is warm and dry. She is not diaphoretic.    ED Course  Procedures (including critical care time)  Labs  Reviewed  POCT URINALYSIS DIP (DEVICE) - Abnormal; Notable for the following:    Hgb urine dipstick LARGE (*)    Protein, ur 30 (*)    Leukocytes, UA SMALL (*)    All other components within normal limits   Dg Abd 1 View  09/23/2012   *RADIOLOGY REPORT*  Clinical Data: Back pain and shoulder pain.  Bilateral flank pain for 20 days.  ABDOMEN - 1 VIEW  Comparison: CT of the abdomen and pelvis 10/29/2007  Findings: There is moderate stool burden. Bowel gas pattern is nonobstructive however.  No abnormal calcifications are identified. There is scoliosis and degenerative change within the lumbar spine.  IMPRESSION:  1.  Moderate stool burden. 2.  Nonobstructive bowel gas pattern.   Original Report Authenticated By: Nolon Nations, M.D.     1. Pain in joint, shoulder region, right   2. Constipation   3. UTI (lower urinary tract infection)       MDM  1 -pt family insisting on a shoulder injection for her chronic OA pain.  There has been no falls.  Pain has been worse since her mobic was discontinued.  I educated pt family that we do  not do joint injections in UC or in the ER (which is where they wanted to go if we couldn't do it here ).  I expressed that they need to use the voltaren given as directed and f/u with pcp in 3 days when they open again.  A  Narcotic would worsen her constipation and her oral NSAIDs were discontinued.  I did review her CMP and printed results for famly who was under the impression she may be in kidney failure since her creatinine was elevated.  I reveiewed the reslts and discussed  The need for f/u with pcp.  2 - otc meds given - see chart  3 - discusse dx and given abx - see chart  SE and precautions of meds reviewed.  Spent over 60 min with this family educating them and over 50% of time spent on problem #1.          Rachell Cipro, MD 09/23/12 [phone]

## 2012-09-23 NOTE — ED Notes (Signed)
Lengthy discussion RE: plan of care and treatment between Dr. Ernie Hew & family.

## 2012-09-23 NOTE — ED Notes (Signed)
Delay explained to pt & family.  Pt attempted to provide another urine sample without success.

## 2012-09-23 NOTE — ED Notes (Signed)
Per daughter: pt started with mild right shoulder pain approx 3 wks ago; had a physical done 3 wks ago & was told her "creatinine was off", so she was taken off meloxicam.  On 5/13 had cortisone injection in right shoulder and was placed on tramadol (Vicodin didn't help).  C/O worsening right shoulder pain.  Is waiting to get an appt with Sports Med.  Also c/o pain around lower back "like a squeezing, contraction"; pain is constant, but with varying intensity.  Denies urinary sxs, n/v, fevers.  Admits to constipation; last BM was on Fri and was normal.  Today felt it was very difficult and unable to have BM.  Pt unable to produce urine sample at this time.

## 2012-09-23 NOTE — ED Notes (Signed)
Attempting to give urine sample again.

## 2012-09-26 ENCOUNTER — Other Ambulatory Visit (HOSPITAL_COMMUNITY): Payer: Self-pay | Admitting: Adult Health

## 2012-09-26 ENCOUNTER — Ambulatory Visit
Admission: RE | Admit: 2012-09-26 | Discharge: 2012-09-26 | Disposition: A | Discharge status: Home / Self Care | Payer: Medicare Other | Source: Ambulatory Visit | Attending: Sports Medicine | Admitting: Sports Medicine

## 2012-09-26 ENCOUNTER — Ambulatory Visit (HOSPITAL_BASED_OUTPATIENT_CLINIC_OR_DEPARTMENT_OTHER): Payer: Medicare Other | Admitting: Family Medicine

## 2012-09-26 ENCOUNTER — Ambulatory Visit (HOSPITAL_COMMUNITY)
Admission: RE | Admit: 2012-09-26 | Discharge: 2012-09-26 | Disposition: A | Discharge status: Home / Self Care | Payer: Medicare Other | Source: Ambulatory Visit | Attending: Adult Health | Admitting: Adult Health

## 2012-09-26 VITALS — BP 134/63 | Ht 64.0 in | Wt 144.0 lb

## 2012-09-26 DIAGNOSIS — M549 Dorsalgia, unspecified: Secondary | ICD-10-CM | POA: Insufficient documentation

## 2012-09-26 DIAGNOSIS — M51379 Other intervertebral disc degeneration, lumbosacral region without mention of lumbar back pain or lower extremity pain: Secondary | ICD-10-CM | POA: Diagnosis not present

## 2012-09-26 DIAGNOSIS — M25519 Pain in unspecified shoulder: Secondary | ICD-10-CM | POA: Diagnosis not present

## 2012-09-26 DIAGNOSIS — R3915 Urgency of urination: Secondary | ICD-10-CM | POA: Diagnosis not present

## 2012-09-26 DIAGNOSIS — M545 Low back pain: Secondary | ICD-10-CM

## 2012-09-26 DIAGNOSIS — M47817 Spondylosis without myelopathy or radiculopathy, lumbosacral region: Secondary | ICD-10-CM | POA: Diagnosis not present

## 2012-09-26 DIAGNOSIS — M5137 Other intervertebral disc degeneration, lumbosacral region: Secondary | ICD-10-CM | POA: Diagnosis not present

## 2012-09-26 DIAGNOSIS — R350 Frequency of micturition: Secondary | ICD-10-CM | POA: Diagnosis not present

## 2012-09-26 DIAGNOSIS — M25511 Pain in right shoulder: Secondary | ICD-10-CM

## 2012-09-26 DIAGNOSIS — R3129 Other microscopic hematuria: Secondary | ICD-10-CM | POA: Diagnosis not present

## 2012-09-26 NOTE — Patient Instructions (Addendum)
Thank you for coming in today. Get the xray today.  I will call with results and plan.  If you have a lot of arthritis I will refer you to the orthopedic surgeon to talk about a shoulder replacement.  If not much I will have you come back for a repeat injection.  Continue tramadol for pain.

## 2012-09-26 NOTE — Progress Notes (Signed)
Jordan [name] is a 78 y.o. female who presents to [facility] today for right shoulder pain. This is been present for over one month now without injury. She was seen initially by her primary care provider where she was diagnosed with rotator cuff tendinitis versus tear. She and her trial of subacromial bursa injection which was helpful but only for several days. Additionally she was started on tramadol which has been moderately helpful. Additionally she's used meloxicam and oral diclofenac which was helpful however resulted in elevation of her creatinine therefore she cannot take them long-term. Following the injection she did well for several days however her pain worsen this weekend and she presented to the emergency room. She was advised to follow up with sports medicine Center for further evaluation and management of her right shoulder. She notes pain in the anterior and lateral upper arm with overhand motion reaching back. She denies any significant neck pain weakness numbness fevers or chills. She feels well otherwise. She is interested in a repeat injection of possible.    PMH reviewed.  History  Substance Use Topics  . Smoking status: Never Smoker   . Smokeless tobacco: Never Used  . Alcohol Use: No   ROS as above otherwise neg   Exam:  BP 134/63  Ht 5\' 4"  (1.626 m)  Wt 144 lb (65.318 kg)  BMI 24.71 kg/m2 Gen: Well NAD MSK: Right shoulder normal-appearing and nontender Range of motion. Significant limitation in abduction ARC active 100 passive 170. Positive drop arm sign. Positive empty can impingement testing. Strength 4/5 supraspinatus 4+/5 external and internal rotation. Positive Yergason's and speeds tests. Grip strength is intact distally Capillary refill sensation is intact distally  Limited musculoskeletal ultrasound right shoulder Biceps tendon is abnormal appearing with hypoechoic change within the tendon sheath with linear splits present at both lung short views consistent with  moderate to severe tendinopathy.  The subscapularis is grossly abnormal with large areas of hypoechoic change and disordered appearing tendon structure consistent with chronic tendinopathy versus tear The supraspinatus tendon additionally has areas of hypoechoic change or much less persistent with moderate tendinopathy without definitive care.  Infraspinatus is normal-appearing.  The a.c. joint has moderate effusion consistent with rotator cuff tendinopathy.

## 2012-09-26 NOTE — Assessment & Plan Note (Signed)
Likely related to rotator cuff tendinopathy seen on musculoskeletal ultrasound. However I'm somewhat concerned for glenohumeral DJD. We have no recent x-rays of her right shoulder. Plan to obtain right shoulder x-rays today. Will call patient with results and arrange followup based on x-ray results. If patient has moderate to severe DJD will refer to orthopedic surgery for consideration for shoulder replacement surgery.  If her DJD is more mild would consider glenohumeral injection.  Will call patient with results.

## 2012-09-27 ENCOUNTER — Telehealth: Payer: Self-pay | Admitting: Family Medicine

## 2012-09-27 NOTE — Telephone Encounter (Signed)
Spoke with patients daughter at GREAT length and I am unable to answer all her questions.  Daughter doesn't know if she should make a f/u appt with [facility] or Dr. Caryl Bis or [facility] which Dr.Sonnenberg mentioned.  Her mother is in pain.  Please advise.  Jonea Bukowski, Loralyn Freshwater, [place]

## 2012-09-27 NOTE — Telephone Encounter (Signed)
Please advise that patient should be seen in the office to evaluate her pain. She can use the voltaren gel or lidoderm patches as prescribed that are listed in her medications within epic. Unfortunately mobic, which has helped in the past is not an option due to its affect on her kidneys. If this is bothering her a significant amount she should call for a same day appointment to be evaluated. Thanks.

## 2012-09-27 NOTE — Telephone Encounter (Signed)
Daughter called about her mother. Mother continues to have a lot of pain in her back. CT scan shows a lot of arthritis. Nurse says daughter should take to family dr to address arthristis.   Seeking direction for care Please advise. Daughter Stasia Cavalier [phone] use [phone] because she is out of town

## 2012-09-28 ENCOUNTER — Telehealth: Payer: Self-pay | Admitting: Family Medicine

## 2012-09-28 NOTE — Telephone Encounter (Signed)
LMOVM.  See note below.  Nandan Willems, Loralyn Freshwater, [place]

## 2012-09-28 NOTE — Telephone Encounter (Signed)
Please advise that she should make an appointment in our clinic. If this pain is bothering her as much as it sounds she should come in for a same day appointment today to be evaluated, it appears my next appointments are next Thursday and it sounds as though she needs to be seen before then. NSAIDs are not an option given her renal function. If they need a refill on the voltaren gel or lidoderm patches please let me know and these can be sent in for her.

## 2012-09-28 NOTE — Telephone Encounter (Signed)
Talked to daughter about shoulder xray results.  Will f/u Wednesday for a US guided injection.

## 2012-10-03 ENCOUNTER — Ambulatory Visit (HOSPITAL_BASED_OUTPATIENT_CLINIC_OR_DEPARTMENT_OTHER): Payer: Medicare Other | Admitting: Family Medicine

## 2012-10-03 VITALS — BP 170/70 | Ht 64.0 in | Wt 144.0 lb

## 2012-10-03 DIAGNOSIS — M25519 Pain in unspecified shoulder: Secondary | ICD-10-CM

## 2012-10-03 DIAGNOSIS — M48061 Spinal stenosis, lumbar region without neurogenic claudication: Secondary | ICD-10-CM

## 2012-10-03 DIAGNOSIS — M25511 Pain in right shoulder: Secondary | ICD-10-CM

## 2012-10-03 HISTORY — DX: Spinal stenosis, lumbar region without neurogenic claudication: M48.061

## 2012-10-03 MED ORDER — MELOXICAM 15 MG PO TABS: 7.5000 mg | ORAL_TABLET | Freq: Every day | ORAL | Status: DC

## 2012-10-03 NOTE — Patient Instructions (Addendum)
Thank you for coming in today. We will get physical therapy for your back.  Re-start the meloxicam.  We will try to set up an appointment with Dr. Rosealee Albee.  We will call if we need an MRI.  Come back here as needed.

## 2012-10-03 NOTE — Assessment & Plan Note (Signed)
Likely related to rotator cuff tendinopathy seen on musculoskeletal ultrasound, and GH DJD.  Plan for glenohumeral injection Restart meloxicam Followup as needed

## 2012-10-03 NOTE — Assessment & Plan Note (Signed)
Patient has significant low back pain related to spinal stenosis and DJD, DDD.  We had a long discussion about treatment options which are quite limited. She is likely not a good surgical candidate for laminectomy. I am not sure about the usefulness of epidural steroid injection. We'll plan on restarting meloxicam after a long discussion of the pros and cons and risks of chronic kidney disease.  The patient and her family feel that the benefit of good pain control is the small risk.  Additionally she will followup with her primary care provider for creatinine monitoring.  Will obtain physical therapy for TENS unit trial.  Will refer to pain management for consideration of epidural steroid injection as that worked well in the past for cervical pain Followup here as needed

## 2012-10-03 NOTE — Progress Notes (Signed)
Jordan [name] is a 79 y.o. female who presents to [facility] today for right shoulder pain and low back pain. Patient previously has been evaluated for right shoulder pain found to be due to glenohumeral DJD and rotator cuff tendinopathy.  She has subacromial bursa injection which is only marginally helpful. In the interim she has had x-rays which showed some glenohumeral DJD, and she is here today for glenohumeral injection under ultrasound guidance.  Complicating this is significantly back pain. She's had a low back x-ray ordered by neurology which showed significant lumbar DJD, DDD and spinal stenosis.  She denies any radiating pain weakness numbness bowel bladder dysfunction.  She notes previously she was taking meloxicam which was quite effective for management of her pain over was stopped for marginal increasing creatinine.     PMH reviewed. As above History  Substance Use Topics  . Smoking status: Never Smoker   . Smokeless tobacco: Never Used  . Alcohol Use: No   ROS as above otherwise neg   Exam:  BP 170/70  Ht 5\' 4"  (1.626 m)  Wt 144 lb (65.318 kg)  BMI 24.71 kg/m2 Gen: Well NAD MSK: Right shoulder. Nontender decreased range of motion.  Lumbar spine: Nontender to spinal midline. Tender to palpation bilateral SI joints.  Sensation is intact distally   Right shoulder ultrasound guided injection Consent obtained and timeout performed Patient position on side with right shoulder up. Posterior shoulder sterilized as well as the ultrasound probe. Sterile ultrasound gel was then used. The posterior glenohumeral joint line was visualized with ultrasound. 25-gauge 1.5 inch needle was used to access the glenohumeral joint under ultrasound guidance. A small amount of fluid was injected seen to distend the joint capsule. 4 mL of lidocaine and 40 mg of Depo-Medrol were injected into the glenohumeral joint. Patient tolerated procedure well.   Dg Lumbar Spine 2-3 Views  09/26/2012   *RADIOLOGY  REPORT*  Clinical Data: Back pain for 2 weeks, no known injury  LUMBAR SPINE - 2-3 VIEW  Comparison: Abdominal radiograph - 09/23/2012; lumbar spine MRI - 02/18/2005  Findings:  Note is again made of lumbarization of the S1 vertebral body. Lumbar spine labeling is in keeping with a remote lumbar spine MRI performed 02/18/2005 and as such for the purposes of this dictation, the five cranial most non-rib bearing lumbar type vertebral bodies will be labeled L1-L5.  Mild to moderate scoliotic curvature of the thoracolumbar spine with inferior curvature convex to the left.  No definite anterolisthesis or retrolisthesis.  Redemonstrated mild (approximate 25%) height loss primarily involving the superior endplate of the L1 vertebral body. Remaining vertebral body heights are preserved.  Redemonstrated moderate to severe multilevel lumbar spine DDD, likely worse at L1 - L2 and L3 - L4 with disc space height loss, end plate irregularity and sclerosis.  Limited visualization of the bilateral SI joints is normal.  Vascular calcifications.  Regional bowel gas pattern and soft tissues are normal.  IMPRESSION: 1.  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 2.  Unchanged mild (approximately 25%) height loss involving the superior endplate of the L1 vertebral body. 3.  Presumably degenerative mild to moderate scoliotic curvature of the thoracolumbar spine with moderate to severe multilevel lumbar spine DDD, likely worse at L1 - L2 and L3 - L4.   Original Report Authenticated By: Jake Seats, MD   Dg Shoulder Right  09/26/2012   *RADIOLOGY REPORT*  Clinical Data: Right shoulder pain.  RIGHT SHOULDER - 2+ VIEW  Comparison: The 12/18/2007  Findings: Degenerative changes in the right [facility] and glenohumeral joints, similar to prior study. No acute bony abnormality. Specifically, no fracture, subluxation, or dislocation.  Soft tissues are intact.  Mild osteopenia.  IMPRESSION: No acute bony abnormality.   Original  Report Authenticated By: Rolm Baptise, M.D.

## 2012-10-04 ENCOUNTER — Telehealth: Payer: Self-pay | Admitting: *Deleted

## 2012-10-04 NOTE — Telephone Encounter (Signed)
Not much else we can do for her back pain as we have tried NSAIDs, vicodin, tramadol. Patient will likely most benefit from PT and referral to pain management for potential epidural injection. She should continue her meloxicam. Will need to ensure that patient gets connected with pain management. Appreciate Dr. Georgina Snell seeing the patient.

## 2012-10-04 NOTE — Telephone Encounter (Signed)
Patient's daughter calling.  States that her mother is having severe back pain.  Has not gotten OOB today because of it.  She saw Dr. Georgina Snell at [facility] yesterday who prescribed Meloxicam but is has not helped.  She also took her mother's BP which was 175/73.  States that this is very high for her Mom.  Asked if patient was experiencing HAs, visual changes or any other neurological changes.  Daughter asked Mom and she denied having any.  Explained that there was not much we could do for her pain since we are not seeing patients this afternoon.  Daughter did ask if Dr. Georgina Snell ordered a pain management referral for her mother to Dr. Dorise Bullion.  Told her I only saw a referral to PT.  Will route note to Dr. Clovis Riley clinic for follow up on this.  Will also route note to Dr. Caryl Bis for any additional advice on her back pain.  Daughter appreciative.  Patient does have a appt with PT on 10/10/12.

## 2012-10-05 ENCOUNTER — Telehealth: Payer: Self-pay | Admitting: *Deleted

## 2012-10-05 ENCOUNTER — Telehealth: Payer: Self-pay | Admitting: Family Medicine

## 2012-10-05 ENCOUNTER — Ambulatory Visit (INDEPENDENT_AMBULATORY_CARE_PROVIDER_SITE_OTHER): Payer: Medicare Other | Admitting: Family Medicine

## 2012-10-05 VITALS — BP 152/67 | HR 67 | Temp 98.0°F | Wt 137.0 lb

## 2012-10-05 DIAGNOSIS — M48061 Spinal stenosis, lumbar region without neurogenic claudication: Secondary | ICD-10-CM | POA: Diagnosis not present

## 2012-10-05 DIAGNOSIS — I10 Essential (primary) hypertension: Secondary | ICD-10-CM

## 2012-10-05 DIAGNOSIS — M549 Dorsalgia, unspecified: Secondary | ICD-10-CM

## 2012-10-05 LAB — POCT URINALYSIS DIPSTICK
Protein, UA: 30
Spec Grav, UA: 1.015
Urobilinogen, UA: 0.2

## 2012-10-05 LAB — POCT UA - MICROSCOPIC ONLY

## 2012-10-05 MED ORDER — OXYCODONE-ACETAMINOPHEN 5-325 MG PO TABS: 1.0000 | ORAL_TABLET | Freq: Three times a day (TID) | ORAL | Status: DC | PRN

## 2012-10-05 NOTE — Telephone Encounter (Signed)
Spoke with [employer] of Sports med - he will set up referral with Dr. Orpah Melter and notify daughter of appointment time. Daughter notified. Daughter is satisfied and happy that appointment will be made - informed her that [facility] would notify of her appointment. Daughter is also concerned with mother's htn even when she is laying in bed - bp is in the [id number] range systolic. Explained that this may be related to pain. Offered appointment this afternoon and daughter agreed to bring mother in at 19 for further evaluation. Daughter satisfied. Tildon Husky, RN-BSN

## 2012-10-05 NOTE — Telephone Encounter (Signed)
Referral info sent to [facility] at Dr. Lauris Poag office today.

## 2012-10-05 NOTE — Telephone Encounter (Signed)
Notified of normal urine studies

## 2012-10-05 NOTE — Patient Instructions (Addendum)
Try the Percocet (for pain); 1 tablet every 8 hours as needed It may make you constipated so take a laxative if no bowel movement in 2 days (Miralax is a good one)  Stop Mobic (meloxicam)  Follow-up in 1 week with Dr. Caryl Bis   Please continue to check blood pressures and bring cuff with you so we can compares numbers here  Worrisome signs that you should go to the ED, UC, or come back to clinic: -Worsening back pain with leg numbness, difficulty controlling urine or stool (more so than usual)

## 2012-10-05 NOTE — Progress Notes (Signed)
  Subjective:    Patient ID: Jordan [name], female    DOB: 08-24-1919, 79 y.o.   MRN: [id number]  HPI # SDA: high blood pressure History of significant back pain (lumbar spinal stenosis); she had recently seen [facility] 06/04 for this. She was started on meloxicam which did not help, and subsequently referred to the pain clinic; appointment 06/11. Vicodin and Tramadol also have not worked in the past.   06/04 170/70 Previously: 134/63, 132/60, 106/62, 128/85, 125/67  Medications currently taking:  Lidoderm patch Mobic--last took today around 12 am; not helping her pain; she has been taking 2-3 days  Review of Systems Denies fevers, chills, nausea, vomiting, abdominal pain, chest pain, difficulty breathing  Allergies, medication, past medical history reviewed.  Smoking status noted.     Objective:   Physical Exam BP 152/67  GEN: NAD when sitting down on wheelchair PSYCH: appropriate to questions; daughter translating for patient who is mostly Spanish-speaking CV: RRR PULM: NI WOB BACK: no CVA tenderness; moderate tenderness diffuse lower lumbar area, not particularly mid line ABD: reducible large umbilical hernai, non-tender; soft NEURO: sensation intact lower extremities, 1+ popliteal reflexes; moves lower extremities well, 3-4/5 strength bilaterally   She uses cane at home; children have been providing assistance to help her ambulate in light of recent exacerbation of back pain     XR lumbar spine 05/28 IMPRESSION:  1. Transitional anatomy with lumbarization of the S1 vertebral  body.  2. Unchanged mild (approximately 25%) height loss involving the  superior endplate of the L1 vertebral body.  3. Presumably degenerative mild to moderate scoliotic curvature of  the thoracolumbar spine with moderate to severe multilevel lumbar  spine DDD, likely worse at L1 - L2 and L3 - L4.     Assessment & Plan:

## 2012-10-05 NOTE — Telephone Encounter (Signed)
Message copied by Ocie Bob on Fri Oct 05, 2012  2:09 PM ------      Message from: Carolyne Littles      Created: Fri Oct 05, 2012 11:28 AM      Regarding: phone message       Mendel Ryder [phone] @ [facility] returned your call regarding this pt.       she left message stating she got your message. Please fax office notes, referral, xrays, studies to [phone]       If you have any question you can call her. Thanks! ------

## 2012-10-07 ENCOUNTER — Other Ambulatory Visit: Payer: Self-pay | Admitting: Family Medicine

## 2012-10-07 DIAGNOSIS — I10 Essential (primary) hypertension: Secondary | ICD-10-CM | POA: Insufficient documentation

## 2012-10-07 NOTE — Assessment & Plan Note (Addendum)
She has been having elevated blood pressures recently.  Fair in clinic today. Would tolerate and aim for systolics [id number] in this 75 YOF due to high fall risk.  Suspect elevated BP at home due to exacerbation of chronic lumbar back pain secondary to stenosis +/- recently being started on NSAID fo this pain.  UA no worrisome for UTI. -Stop meloxicam; try Percocet prn (Tramadol and Vicodin not helpful in past) -She was recently seen at [facility] and recommended conservative management as well as referral pain clinic (referral made today) -Follow-up in 1 week or sooner if needed; see AVS regarding red flags discussed

## 2012-10-07 NOTE — Assessment & Plan Note (Signed)
See hypertension A/P

## 2012-10-08 ENCOUNTER — Telehealth: Payer: Self-pay | Admitting: Family Medicine

## 2012-10-08 NOTE — Telephone Encounter (Signed)
The patients daughter is calling to speak to the nurse about her mothers pain not being managed with the Percocet.  Can she take Motrin or Ibuprofen for the inflammation with the Percocet.

## 2012-10-08 NOTE — Telephone Encounter (Signed)
Patient recently seen in [facility] and had discussion with Dr. Georgina Snell regarding meloxicam and its risks and benefits with her kidney function. The same risks (worsening kidney function) and benefits (potential improvement in pain). If the patient understands these risks and benefits and decides the benefits outweigh the risks then she can take ibuprofen or motrin. Please advise patient of this. In the end the pain management referral will be of the best benefit.

## 2012-10-08 NOTE — Telephone Encounter (Signed)
Will fwd to MD.  Jetta Murray L, CMA  

## 2012-10-08 NOTE — Telephone Encounter (Signed)
Pt notified.  Kary Sugrue L, CMA  

## 2012-10-09 ENCOUNTER — Ambulatory Visit: Payer: Medicare Other | Attending: Family Medicine | Admitting: Physical Therapy

## 2012-10-09 DIAGNOSIS — M545 Low back pain, unspecified: Secondary | ICD-10-CM | POA: Insufficient documentation

## 2012-10-09 DIAGNOSIS — M549 Dorsalgia, unspecified: Secondary | ICD-10-CM | POA: Insufficient documentation

## 2012-10-09 DIAGNOSIS — G8929 Other chronic pain: Secondary | ICD-10-CM | POA: Insufficient documentation

## 2012-10-09 DIAGNOSIS — IMO0001 Reserved for inherently not codable concepts without codable children: Secondary | ICD-10-CM | POA: Diagnosis not present

## 2012-10-10 ENCOUNTER — Ambulatory Visit: Payer: Medicare Other | Admitting: Physical Therapy

## 2012-10-10 DIAGNOSIS — M545 Low back pain: Secondary | ICD-10-CM | POA: Diagnosis not present

## 2012-10-10 DIAGNOSIS — IMO0001 Reserved for inherently not codable concepts without codable children: Secondary | ICD-10-CM | POA: Diagnosis not present

## 2012-10-10 DIAGNOSIS — G8929 Other chronic pain: Secondary | ICD-10-CM | POA: Diagnosis not present

## 2012-10-10 DIAGNOSIS — M549 Dorsalgia, unspecified: Secondary | ICD-10-CM | POA: Diagnosis not present

## 2012-10-11 ENCOUNTER — Ambulatory Visit: Payer: Medicare Other | Admitting: Physical Therapy

## 2012-10-11 DIAGNOSIS — M545 Low back pain: Secondary | ICD-10-CM | POA: Diagnosis not present

## 2012-10-11 DIAGNOSIS — M549 Dorsalgia, unspecified: Secondary | ICD-10-CM | POA: Diagnosis not present

## 2012-10-11 DIAGNOSIS — IMO0001 Reserved for inherently not codable concepts without codable children: Secondary | ICD-10-CM | POA: Diagnosis not present

## 2012-10-11 DIAGNOSIS — G8929 Other chronic pain: Secondary | ICD-10-CM | POA: Diagnosis not present

## 2012-10-15 ENCOUNTER — Ambulatory Visit: Payer: Medicare Other | Admitting: Physical Therapy

## 2012-10-15 DIAGNOSIS — IMO0002 Reserved for concepts with insufficient information to code with codable children: Secondary | ICD-10-CM | POA: Diagnosis not present

## 2012-10-15 DIAGNOSIS — M546 Pain in thoracic spine: Secondary | ICD-10-CM | POA: Diagnosis not present

## 2012-10-15 DIAGNOSIS — Z79899 Other long term (current) drug therapy: Secondary | ICD-10-CM | POA: Diagnosis not present

## 2012-10-17 ENCOUNTER — Ambulatory Visit: Payer: Medicare Other | Admitting: Physical Therapy

## 2012-10-17 DIAGNOSIS — IMO0002 Reserved for concepts with insufficient information to code with codable children: Secondary | ICD-10-CM | POA: Diagnosis not present

## 2012-10-18 ENCOUNTER — Ambulatory Visit: Payer: Medicare Other | Admitting: Physical Therapy

## 2012-10-22 ENCOUNTER — Telehealth: Payer: Self-pay | Admitting: *Deleted

## 2012-10-22 NOTE — Telephone Encounter (Signed)
Returned call to patients daughter regarding Jordan [name]. Per daughter patient is in a lot of back pain. Has been seen by pain clinic and received injection in back last week without much benefit. Patients daughter received message from pain clinic after calling them about her pain as well and the message said they were scheduling her for an additional injection this Wednesday and will order an MRI to evaluate her back further.  Also stated that patient was referred to PT and went for 2 sessions, though had to stop going due to pain. Also requesting Geriatrics referral. I think this is appropriate to evaluate her falls risk as had recent instability prior to onset of back pain and multiple medications that could be contributing to this instability issue. Will ask that patient be called to schedule for geri clinic.

## 2012-10-22 NOTE — Telephone Encounter (Signed)
Daughter called and would like to have MRI done for ongoing pain - pain shots are not helping. Also, would like to be referred to [facility] clinic. Please call daughter to address concerns. Tildon Husky, RN-BSN

## 2012-10-24 DIAGNOSIS — M545 Low back pain: Secondary | ICD-10-CM | POA: Diagnosis not present

## 2012-10-24 DIAGNOSIS — M25559 Pain in unspecified hip: Secondary | ICD-10-CM | POA: Diagnosis not present

## 2012-10-24 DIAGNOSIS — IMO0002 Reserved for concepts with insufficient information to code with codable children: Secondary | ICD-10-CM | POA: Diagnosis not present

## 2012-10-29 ENCOUNTER — Other Ambulatory Visit: Payer: Self-pay | Admitting: Physical Medicine and Rehabilitation

## 2012-10-29 DIAGNOSIS — M67919 Unspecified disorder of synovium and tendon, unspecified shoulder: Secondary | ICD-10-CM

## 2012-10-30 DIAGNOSIS — M545 Low back pain: Secondary | ICD-10-CM | POA: Diagnosis not present

## 2012-10-30 DIAGNOSIS — Z79899 Other long term (current) drug therapy: Secondary | ICD-10-CM | POA: Diagnosis not present

## 2012-10-30 DIAGNOSIS — M719 Bursopathy, unspecified: Secondary | ICD-10-CM | POA: Diagnosis not present

## 2012-11-01 ENCOUNTER — Ambulatory Visit: Payer: Medicare Other

## 2012-11-06 ENCOUNTER — Ambulatory Visit
Admission: RE | Admit: 2012-11-06 | Discharge: 2012-11-06 | Disposition: A | Discharge status: Home / Self Care | Payer: Medicare Other | Source: Ambulatory Visit | Attending: Physical Medicine and Rehabilitation | Admitting: Physical Medicine and Rehabilitation

## 2012-11-06 ENCOUNTER — Ambulatory Visit: Payer: Medicare Other | Attending: Family Medicine | Admitting: Physical Therapy

## 2012-11-06 DIAGNOSIS — M67919 Unspecified disorder of synovium and tendon, unspecified shoulder: Secondary | ICD-10-CM | POA: Diagnosis not present

## 2012-11-06 DIAGNOSIS — M549 Dorsalgia, unspecified: Secondary | ICD-10-CM | POA: Diagnosis not present

## 2012-11-06 DIAGNOSIS — M545 Low back pain, unspecified: Secondary | ICD-10-CM | POA: Insufficient documentation

## 2012-11-06 DIAGNOSIS — M19019 Primary osteoarthritis, unspecified shoulder: Secondary | ICD-10-CM | POA: Diagnosis not present

## 2012-11-06 DIAGNOSIS — IMO0001 Reserved for inherently not codable concepts without codable children: Secondary | ICD-10-CM | POA: Diagnosis not present

## 2012-11-06 DIAGNOSIS — G8929 Other chronic pain: Secondary | ICD-10-CM | POA: Insufficient documentation

## 2012-11-06 DIAGNOSIS — M719 Bursopathy, unspecified: Secondary | ICD-10-CM | POA: Diagnosis not present

## 2012-11-06 DIAGNOSIS — M75100 Unspecified rotator cuff tear or rupture of unspecified shoulder, not specified as traumatic: Secondary | ICD-10-CM | POA: Diagnosis not present

## 2012-11-08 ENCOUNTER — Encounter: Payer: Self-pay | Admitting: Family Medicine

## 2012-11-08 ENCOUNTER — Ambulatory Visit (INDEPENDENT_AMBULATORY_CARE_PROVIDER_SITE_OTHER): Payer: Medicare Other | Admitting: Family Medicine

## 2012-11-08 VITALS — BP 126/57 | HR 71 | Ht 64.0 in | Wt 137.7 lb

## 2012-11-08 DIAGNOSIS — N3941 Urge incontinence: Secondary | ICD-10-CM

## 2012-11-08 DIAGNOSIS — F329 Major depressive disorder, single episode, unspecified: Secondary | ICD-10-CM

## 2012-11-08 DIAGNOSIS — I10 Essential (primary) hypertension: Secondary | ICD-10-CM

## 2012-11-08 DIAGNOSIS — M12811 Other specific arthropathies, not elsewhere classified, right shoulder: Secondary | ICD-10-CM

## 2012-11-08 DIAGNOSIS — M19019 Primary osteoarthritis, unspecified shoulder: Secondary | ICD-10-CM

## 2012-11-08 MED ORDER — HYDROCODONE-ACETAMINOPHEN 5-325 MG PO TABS: 1.0000 | ORAL_TABLET | ORAL | Status: DC | PRN

## 2012-11-08 MED ORDER — FESOTERODINE FUMARATE ER 4 MG PO TB24: 8.0000 mg | ORAL_TABLET | Freq: Every day | ORAL | Status: DC

## 2012-11-08 NOTE — Progress Notes (Signed)
  Subjective:    Patient ID: Jordan [name], female    DOB: 07/07/1919, 79 y.o.   MRN: [id number]  HPI right shoulder pain - persists and keeps her awake. Had an MRI of it yesterday which shows complete supra and infraspinatus muscle tears with edema and inflammation. Steroid injection by Dr Georgina Snell one month ago. Pain 8/10 even after she takes her hydrocodone which she is only doing three times daily.   Right low back pain - She's had injections x 2 by Dr Brien Few and he is considering ?neurolysis.   Nocturia x 2. Goes to bed 11 PM and arises 10:30 AM.  Review of Systems     Objective:   Physical Exam  Neck:  Mildly reduced range of motion   Cardiovascular: Normal rate and regular rhythm.   Pulmonary/Chest: Effort normal. She has no wheezes. She has no rales.  Genitourinary: No vaginal discharge found.  Musculoskeletal:  Able to get right arm behind her occiput and behind her back   Neurological: She is alert.  Psychiatric:  Mildly dysphoric as usual          Assessment & Plan:

## 2012-11-08 NOTE — Assessment & Plan Note (Signed)
well controlled  

## 2012-11-08 NOTE — Assessment & Plan Note (Signed)
On Mirtazapine and Sertraline

## 2012-11-08 NOTE — Patient Instructions (Signed)
Please return to see Dr Caryl Bis in 1 month  Please communicate with [place] via computer in one week regarding the level of pain on the more frequent dosing of the Hydrocodone/APAP

## 2012-11-08 NOTE — Assessment & Plan Note (Addendum)
Complete tear of right supra and infraspinatus on MRI Since she is able to get her hand behind her head and her back, and maintain the right arm abducted, I did not recommend surgery at her age due to risk of frozen shoulder.

## 2012-11-12 ENCOUNTER — Other Ambulatory Visit: Payer: Self-pay | Admitting: Family Medicine

## 2012-11-13 ENCOUNTER — Ambulatory Visit: Payer: Medicare Other | Admitting: Physical Therapy

## 2012-11-13 DIAGNOSIS — M545 Low back pain: Secondary | ICD-10-CM | POA: Diagnosis not present

## 2012-11-13 DIAGNOSIS — G8929 Other chronic pain: Secondary | ICD-10-CM | POA: Diagnosis not present

## 2012-11-13 DIAGNOSIS — IMO0001 Reserved for inherently not codable concepts without codable children: Secondary | ICD-10-CM | POA: Diagnosis not present

## 2012-11-13 DIAGNOSIS — M549 Dorsalgia, unspecified: Secondary | ICD-10-CM | POA: Diagnosis not present

## 2012-11-15 ENCOUNTER — Ambulatory Visit: Payer: Medicare Other | Admitting: Physical Therapy

## 2012-11-15 DIAGNOSIS — G8929 Other chronic pain: Secondary | ICD-10-CM | POA: Diagnosis not present

## 2012-11-15 DIAGNOSIS — IMO0001 Reserved for inherently not codable concepts without codable children: Secondary | ICD-10-CM | POA: Diagnosis not present

## 2012-11-15 DIAGNOSIS — M545 Low back pain: Secondary | ICD-10-CM | POA: Diagnosis not present

## 2012-11-15 DIAGNOSIS — M549 Dorsalgia, unspecified: Secondary | ICD-10-CM | POA: Diagnosis not present

## 2012-11-20 ENCOUNTER — Ambulatory Visit: Payer: Medicare Other | Admitting: Physical Therapy

## 2012-11-22 ENCOUNTER — Ambulatory Visit: Payer: Medicare Other | Admitting: Physical Therapy

## 2012-11-22 DIAGNOSIS — M549 Dorsalgia, unspecified: Secondary | ICD-10-CM | POA: Diagnosis not present

## 2012-11-22 DIAGNOSIS — M545 Low back pain: Secondary | ICD-10-CM | POA: Diagnosis not present

## 2012-11-22 DIAGNOSIS — G8929 Other chronic pain: Secondary | ICD-10-CM | POA: Diagnosis not present

## 2012-11-22 DIAGNOSIS — IMO0001 Reserved for inherently not codable concepts without codable children: Secondary | ICD-10-CM | POA: Diagnosis not present

## 2012-11-26 ENCOUNTER — Ambulatory Visit: Payer: Medicare Other | Admitting: Physical Therapy

## 2012-11-27 ENCOUNTER — Encounter: Payer: Medicare Other | Admitting: Physical Therapy

## 2012-11-27 ENCOUNTER — Other Ambulatory Visit: Payer: Self-pay | Admitting: *Deleted

## 2012-11-27 DIAGNOSIS — G609 Hereditary and idiopathic neuropathy, unspecified: Secondary | ICD-10-CM

## 2012-11-27 MED ORDER — GABAPENTIN 300 MG PO CAPS: 300.0000 mg | ORAL_CAPSULE | Freq: Three times a day (TID) | ORAL | Status: DC

## 2012-11-28 ENCOUNTER — Ambulatory Visit: Payer: Medicare Other | Admitting: Physical Therapy

## 2012-11-28 DIAGNOSIS — IMO0001 Reserved for inherently not codable concepts without codable children: Secondary | ICD-10-CM | POA: Diagnosis not present

## 2012-11-28 DIAGNOSIS — M545 Low back pain: Secondary | ICD-10-CM | POA: Diagnosis not present

## 2012-11-28 DIAGNOSIS — M549 Dorsalgia, unspecified: Secondary | ICD-10-CM | POA: Diagnosis not present

## 2012-11-28 DIAGNOSIS — G8929 Other chronic pain: Secondary | ICD-10-CM | POA: Diagnosis not present

## 2012-11-29 ENCOUNTER — Ambulatory Visit: Payer: Medicare Other

## 2012-11-29 DIAGNOSIS — M545 Low back pain: Secondary | ICD-10-CM | POA: Diagnosis not present

## 2012-11-29 DIAGNOSIS — G8929 Other chronic pain: Secondary | ICD-10-CM | POA: Diagnosis not present

## 2012-11-29 DIAGNOSIS — M549 Dorsalgia, unspecified: Secondary | ICD-10-CM | POA: Diagnosis not present

## 2012-11-29 DIAGNOSIS — IMO0001 Reserved for inherently not codable concepts without codable children: Secondary | ICD-10-CM | POA: Diagnosis not present

## 2012-12-03 ENCOUNTER — Other Ambulatory Visit: Payer: Self-pay | Admitting: Physical Medicine and Rehabilitation

## 2012-12-03 DIAGNOSIS — M545 Low back pain: Secondary | ICD-10-CM

## 2012-12-04 ENCOUNTER — Ambulatory Visit: Payer: Medicare Other | Attending: Family Medicine | Admitting: Physical Therapy

## 2012-12-04 DIAGNOSIS — M545 Low back pain, unspecified: Secondary | ICD-10-CM | POA: Insufficient documentation

## 2012-12-04 DIAGNOSIS — M549 Dorsalgia, unspecified: Secondary | ICD-10-CM | POA: Diagnosis not present

## 2012-12-04 DIAGNOSIS — G8929 Other chronic pain: Secondary | ICD-10-CM | POA: Insufficient documentation

## 2012-12-04 DIAGNOSIS — IMO0001 Reserved for inherently not codable concepts without codable children: Secondary | ICD-10-CM | POA: Diagnosis not present

## 2012-12-05 DIAGNOSIS — M48061 Spinal stenosis, lumbar region without neurogenic claudication: Secondary | ICD-10-CM | POA: Diagnosis not present

## 2012-12-05 DIAGNOSIS — IMO0002 Reserved for concepts with insufficient information to code with codable children: Secondary | ICD-10-CM | POA: Diagnosis not present

## 2012-12-06 ENCOUNTER — Ambulatory Visit: Payer: Medicare Other | Admitting: Physical Therapy

## 2012-12-07 ENCOUNTER — Ambulatory Visit
Admission: RE | Admit: 2012-12-07 | Discharge: 2012-12-07 | Disposition: A | Discharge status: Home / Self Care | Payer: Medicare Other | Source: Ambulatory Visit | Attending: Physical Medicine and Rehabilitation | Admitting: Physical Medicine and Rehabilitation

## 2012-12-07 DIAGNOSIS — M545 Low back pain: Secondary | ICD-10-CM

## 2012-12-07 DIAGNOSIS — S32009A Unspecified fracture of unspecified lumbar vertebra, initial encounter for closed fracture: Secondary | ICD-10-CM | POA: Diagnosis not present

## 2012-12-07 MED ORDER — GADOBENATE DIMEGLUMINE 529 MG/ML IV SOLN: 7.0000 mL | Freq: Once | INTRAVENOUS | Status: AC | PRN

## 2012-12-11 ENCOUNTER — Ambulatory Visit: Payer: Medicare Other | Admitting: Physical Therapy

## 2012-12-11 ENCOUNTER — Other Ambulatory Visit: Payer: Self-pay | Admitting: Family Medicine

## 2012-12-13 ENCOUNTER — Ambulatory Visit: Payer: Medicare Other | Admitting: Physical Therapy

## 2012-12-13 DIAGNOSIS — IMO0001 Reserved for inherently not codable concepts without codable children: Secondary | ICD-10-CM | POA: Diagnosis not present

## 2012-12-15 ENCOUNTER — Other Ambulatory Visit: Payer: Self-pay | Admitting: Family Medicine

## 2012-12-18 ENCOUNTER — Ambulatory Visit: Payer: Medicare Other | Admitting: Physical Therapy

## 2012-12-18 DIAGNOSIS — IMO0001 Reserved for inherently not codable concepts without codable children: Secondary | ICD-10-CM | POA: Diagnosis not present

## 2012-12-19 ENCOUNTER — Telehealth: Payer: Self-pay | Admitting: *Deleted

## 2012-12-19 DIAGNOSIS — M19019 Primary osteoarthritis, unspecified shoulder: Secondary | ICD-10-CM | POA: Diagnosis not present

## 2012-12-19 NOTE — Telephone Encounter (Signed)
Spoke with [employer].  Patient was referred to Dr. Brien Few (neuro) and he referred patient to their office for rotator cuff tear.  NPI # given for 2 visits and they will fax office notes to our office.   Nolene Ebbs, RN

## 2012-12-19 NOTE — Telephone Encounter (Signed)
Message copied by Thana Ates on Wed Dec 19, 2012  2:17 PM ------      Message from: [place] Pink      Created: Tue Dec 18, 2012 12:37 PM      Regarding: NPI #       Received a message form [place] wanting our NPI # for an appointment for Ms Murley tomorrow. I don't see that a referral was entered and not sure if we are supposed to give out the number without a referral.Busick, Kevin Fenton             ------

## 2012-12-20 ENCOUNTER — Ambulatory Visit: Payer: Medicare Other | Admitting: Physical Therapy

## 2012-12-20 DIAGNOSIS — IMO0001 Reserved for inherently not codable concepts without codable children: Secondary | ICD-10-CM | POA: Diagnosis not present

## 2012-12-21 ENCOUNTER — Telehealth: Payer: Self-pay | Admitting: *Deleted

## 2012-12-21 NOTE — Telephone Encounter (Signed)
Message for Lorrie left with npi regarding pt - stating that it had been left 2 days ago for 2 visits by Janeice Robinson, RN-BSN

## 2012-12-24 ENCOUNTER — Ambulatory Visit: Payer: Medicare Other | Admitting: Physical Therapy

## 2012-12-26 ENCOUNTER — Ambulatory Visit: Payer: Medicare Other | Admitting: Physical Therapy

## 2012-12-26 ENCOUNTER — Telehealth: Payer: Self-pay | Admitting: Family Medicine

## 2012-12-26 DIAGNOSIS — IMO0001 Reserved for inherently not codable concepts without codable children: Secondary | ICD-10-CM | POA: Diagnosis not present

## 2012-12-26 NOTE — Telephone Encounter (Signed)
Daughter called concerning her mother Jordan [name], she said that the ortho doctor told them that her pcp has to prescribe some medication for pain and that they can not do this. She is not sure what route she is suppose to take. She would like the doctor or nurse to call her asap so that this can be taking care of.JW

## 2012-12-26 NOTE — Telephone Encounter (Signed)
Reports that mother went to see ortho dr for pain in shoulder - and needs referral   -  Per records we have given this to [facility] ped. Twice - agreed to call [facility] for pt [facility] ortho message left stating that two visits have been authorizaed for pt on 8/20 & Wilcox, RN-BSN

## 2012-12-27 ENCOUNTER — Ambulatory Visit: Payer: Medicare Other | Admitting: Physical Therapy

## 2012-12-27 DIAGNOSIS — IMO0001 Reserved for inherently not codable concepts without codable children: Secondary | ICD-10-CM | POA: Diagnosis not present

## 2013-01-02 ENCOUNTER — Ambulatory Visit: Payer: Medicare Other | Attending: Family Medicine | Admitting: Physical Therapy

## 2013-01-02 DIAGNOSIS — M545 Low back pain, unspecified: Secondary | ICD-10-CM | POA: Insufficient documentation

## 2013-01-02 DIAGNOSIS — G8929 Other chronic pain: Secondary | ICD-10-CM | POA: Insufficient documentation

## 2013-01-02 DIAGNOSIS — IMO0001 Reserved for inherently not codable concepts without codable children: Secondary | ICD-10-CM | POA: Diagnosis not present

## 2013-01-02 DIAGNOSIS — M549 Dorsalgia, unspecified: Secondary | ICD-10-CM | POA: Insufficient documentation

## 2013-01-03 ENCOUNTER — Ambulatory Visit: Payer: Medicare Other | Admitting: Physical Therapy

## 2013-01-03 ENCOUNTER — Other Ambulatory Visit: Payer: Self-pay | Admitting: Family Medicine

## 2013-01-03 DIAGNOSIS — M545 Low back pain: Secondary | ICD-10-CM | POA: Diagnosis not present

## 2013-01-03 DIAGNOSIS — IMO0001 Reserved for inherently not codable concepts without codable children: Secondary | ICD-10-CM | POA: Diagnosis not present

## 2013-01-03 DIAGNOSIS — G8929 Other chronic pain: Secondary | ICD-10-CM | POA: Diagnosis not present

## 2013-01-03 DIAGNOSIS — M549 Dorsalgia, unspecified: Secondary | ICD-10-CM | POA: Diagnosis not present

## 2013-01-07 ENCOUNTER — Ambulatory Visit: Payer: Medicare Other | Admitting: Physical Therapy

## 2013-01-10 ENCOUNTER — Other Ambulatory Visit: Payer: Self-pay | Admitting: Family Medicine

## 2013-01-10 ENCOUNTER — Ambulatory Visit: Payer: Medicare Other | Admitting: Physical Therapy

## 2013-01-10 DIAGNOSIS — M19019 Primary osteoarthritis, unspecified shoulder: Secondary | ICD-10-CM | POA: Diagnosis not present

## 2013-01-15 ENCOUNTER — Ambulatory Visit: Payer: Medicare Other | Admitting: Physical Therapy

## 2013-01-16 ENCOUNTER — Ambulatory Visit: Payer: Medicare Other | Admitting: Physical Therapy

## 2013-01-16 DIAGNOSIS — IMO0001 Reserved for inherently not codable concepts without codable children: Secondary | ICD-10-CM | POA: Diagnosis not present

## 2013-01-16 DIAGNOSIS — M549 Dorsalgia, unspecified: Secondary | ICD-10-CM | POA: Diagnosis not present

## 2013-01-16 DIAGNOSIS — G8929 Other chronic pain: Secondary | ICD-10-CM | POA: Diagnosis not present

## 2013-01-16 DIAGNOSIS — M545 Low back pain: Secondary | ICD-10-CM | POA: Diagnosis not present

## 2013-01-17 ENCOUNTER — Other Ambulatory Visit: Payer: Self-pay | Admitting: Physical Medicine and Rehabilitation

## 2013-01-17 ENCOUNTER — Ambulatory Visit: Payer: Medicare Other | Admitting: Physical Therapy

## 2013-01-17 DIAGNOSIS — M549 Dorsalgia, unspecified: Secondary | ICD-10-CM | POA: Diagnosis not present

## 2013-01-17 DIAGNOSIS — M48061 Spinal stenosis, lumbar region without neurogenic claudication: Secondary | ICD-10-CM

## 2013-01-17 DIAGNOSIS — IMO0001 Reserved for inherently not codable concepts without codable children: Secondary | ICD-10-CM | POA: Diagnosis not present

## 2013-01-17 DIAGNOSIS — G8929 Other chronic pain: Secondary | ICD-10-CM | POA: Diagnosis not present

## 2013-01-17 DIAGNOSIS — M545 Low back pain: Secondary | ICD-10-CM | POA: Diagnosis not present

## 2013-01-21 ENCOUNTER — Other Ambulatory Visit: Payer: Self-pay | Admitting: Physical Medicine and Rehabilitation

## 2013-01-21 ENCOUNTER — Ambulatory Visit
Admission: RE | Admit: 2013-01-21 | Discharge: 2013-01-21 | Disposition: A | Discharge status: Home / Self Care | Payer: Medicare Other | Source: Ambulatory Visit | Attending: Physical Medicine and Rehabilitation | Admitting: Physical Medicine and Rehabilitation

## 2013-01-21 DIAGNOSIS — M25551 Pain in right hip: Secondary | ICD-10-CM

## 2013-01-21 DIAGNOSIS — M19019 Primary osteoarthritis, unspecified shoulder: Secondary | ICD-10-CM | POA: Diagnosis not present

## 2013-01-21 DIAGNOSIS — M25559 Pain in unspecified hip: Secondary | ICD-10-CM | POA: Diagnosis not present

## 2013-01-21 DIAGNOSIS — M8448XA Pathological fracture, other site, initial encounter for fracture: Secondary | ICD-10-CM | POA: Diagnosis not present

## 2013-01-21 DIAGNOSIS — M48061 Spinal stenosis, lumbar region without neurogenic claudication: Secondary | ICD-10-CM

## 2013-01-22 ENCOUNTER — Ambulatory Visit: Payer: Medicare Other | Admitting: Physical Therapy

## 2013-01-22 DIAGNOSIS — M545 Low back pain: Secondary | ICD-10-CM | POA: Diagnosis not present

## 2013-01-22 DIAGNOSIS — G8929 Other chronic pain: Secondary | ICD-10-CM | POA: Diagnosis not present

## 2013-01-22 DIAGNOSIS — M549 Dorsalgia, unspecified: Secondary | ICD-10-CM | POA: Diagnosis not present

## 2013-01-22 DIAGNOSIS — IMO0001 Reserved for inherently not codable concepts without codable children: Secondary | ICD-10-CM | POA: Diagnosis not present

## 2013-01-23 ENCOUNTER — Ambulatory Visit: Payer: Medicare Other | Admitting: Physical Therapy

## 2013-01-23 DIAGNOSIS — G8929 Other chronic pain: Secondary | ICD-10-CM | POA: Diagnosis not present

## 2013-01-23 DIAGNOSIS — IMO0001 Reserved for inherently not codable concepts without codable children: Secondary | ICD-10-CM | POA: Diagnosis not present

## 2013-01-23 DIAGNOSIS — M549 Dorsalgia, unspecified: Secondary | ICD-10-CM | POA: Diagnosis not present

## 2013-01-23 DIAGNOSIS — M545 Low back pain: Secondary | ICD-10-CM | POA: Diagnosis not present

## 2013-01-24 ENCOUNTER — Ambulatory Visit: Payer: Medicare Other | Admitting: Physical Therapy

## 2013-01-29 ENCOUNTER — Ambulatory Visit: Payer: Medicare Other | Admitting: Physical Therapy

## 2013-01-29 DIAGNOSIS — G8929 Other chronic pain: Secondary | ICD-10-CM | POA: Diagnosis not present

## 2013-01-29 DIAGNOSIS — IMO0001 Reserved for inherently not codable concepts without codable children: Secondary | ICD-10-CM | POA: Diagnosis not present

## 2013-01-29 DIAGNOSIS — M545 Low back pain: Secondary | ICD-10-CM | POA: Diagnosis not present

## 2013-01-29 DIAGNOSIS — M549 Dorsalgia, unspecified: Secondary | ICD-10-CM | POA: Diagnosis not present

## 2013-01-31 ENCOUNTER — Ambulatory Visit: Payer: Medicare Other | Attending: Family Medicine | Admitting: Physical Therapy

## 2013-01-31 ENCOUNTER — Telehealth: Payer: Self-pay | Admitting: Family Medicine

## 2013-01-31 DIAGNOSIS — M549 Dorsalgia, unspecified: Secondary | ICD-10-CM | POA: Diagnosis not present

## 2013-01-31 DIAGNOSIS — M545 Low back pain, unspecified: Secondary | ICD-10-CM | POA: Insufficient documentation

## 2013-01-31 DIAGNOSIS — G8929 Other chronic pain: Secondary | ICD-10-CM | POA: Insufficient documentation

## 2013-01-31 DIAGNOSIS — IMO0001 Reserved for inherently not codable concepts without codable children: Secondary | ICD-10-CM | POA: Insufficient documentation

## 2013-01-31 NOTE — Telephone Encounter (Signed)
Son dropped off FMLA form to be filled out.  Please call him when completed.

## 2013-02-01 NOTE — Telephone Encounter (Signed)
Placed in MD box. Mohanad Carsten, [name]

## 2013-02-05 ENCOUNTER — Ambulatory Visit: Payer: Medicare Other | Admitting: Physical Therapy

## 2013-02-07 ENCOUNTER — Ambulatory Visit: Payer: Medicare Other | Admitting: Physical Therapy

## 2013-02-08 ENCOUNTER — Emergency Department (HOSPITAL_COMMUNITY): Payer: Medicare Other

## 2013-02-08 ENCOUNTER — Inpatient Hospital Stay (HOSPITAL_COMMUNITY)
Admission: EM | Admit: 2013-02-08 | Discharge: 2013-02-11 | DRG: 262 | Disposition: A | Discharge status: Home Health | Payer: Medicare Other | Attending: Family Medicine | Admitting: Family Medicine

## 2013-02-08 ENCOUNTER — Encounter (HOSPITAL_COMMUNITY): Payer: Self-pay | Admitting: Emergency Medicine

## 2013-02-08 DIAGNOSIS — I440 Atrioventricular block, first degree: Secondary | ICD-10-CM | POA: Diagnosis present

## 2013-02-08 DIAGNOSIS — F329 Major depressive disorder, single episode, unspecified: Secondary | ICD-10-CM | POA: Diagnosis present

## 2013-02-08 DIAGNOSIS — M1990 Unspecified osteoarthritis, unspecified site: Secondary | ICD-10-CM | POA: Diagnosis present

## 2013-02-08 DIAGNOSIS — G839 Paralytic syndrome, unspecified: Secondary | ICD-10-CM | POA: Diagnosis not present

## 2013-02-08 DIAGNOSIS — M4800 Spinal stenosis, site unspecified: Secondary | ICD-10-CM | POA: Diagnosis present

## 2013-02-08 DIAGNOSIS — I10 Essential (primary) hypertension: Secondary | ICD-10-CM | POA: Diagnosis not present

## 2013-02-08 DIAGNOSIS — G609 Hereditary and idiopathic neuropathy, unspecified: Secondary | ICD-10-CM | POA: Diagnosis present

## 2013-02-08 DIAGNOSIS — N183 Chronic kidney disease, stage 3 unspecified: Secondary | ICD-10-CM | POA: Diagnosis not present

## 2013-02-08 DIAGNOSIS — S43429A Sprain of unspecified rotator cuff capsule, initial encounter: Secondary | ICD-10-CM | POA: Diagnosis present

## 2013-02-08 DIAGNOSIS — G4700 Insomnia, unspecified: Secondary | ICD-10-CM | POA: Diagnosis present

## 2013-02-08 DIAGNOSIS — IMO0002 Reserved for concepts with insufficient information to code with codable children: Secondary | ICD-10-CM | POA: Diagnosis not present

## 2013-02-08 DIAGNOSIS — W19XXXA Unspecified fall, initial encounter: Secondary | ICD-10-CM | POA: Diagnosis present

## 2013-02-08 DIAGNOSIS — R42 Dizziness and giddiness: Secondary | ICD-10-CM | POA: Diagnosis not present

## 2013-02-08 DIAGNOSIS — R222 Localized swelling, mass and lump, trunk: Secondary | ICD-10-CM | POA: Diagnosis present

## 2013-02-08 DIAGNOSIS — I517 Cardiomegaly: Secondary | ICD-10-CM | POA: Diagnosis not present

## 2013-02-08 DIAGNOSIS — S4980XA Other specified injuries of shoulder and upper arm, unspecified arm, initial encounter: Secondary | ICD-10-CM | POA: Diagnosis not present

## 2013-02-08 DIAGNOSIS — E039 Hypothyroidism, unspecified: Secondary | ICD-10-CM | POA: Diagnosis present

## 2013-02-08 DIAGNOSIS — I214 Non-ST elevation (NSTEMI) myocardial infarction: Secondary | ICD-10-CM | POA: Diagnosis not present

## 2013-02-08 DIAGNOSIS — M47812 Spondylosis without myelopathy or radiculopathy, cervical region: Secondary | ICD-10-CM | POA: Diagnosis not present

## 2013-02-08 DIAGNOSIS — M810 Age-related osteoporosis without current pathological fracture: Secondary | ICD-10-CM | POA: Diagnosis present

## 2013-02-08 DIAGNOSIS — S298XXA Other specified injuries of thorax, initial encounter: Secondary | ICD-10-CM | POA: Diagnosis not present

## 2013-02-08 DIAGNOSIS — R404 Transient alteration of awareness: Secondary | ICD-10-CM | POA: Diagnosis not present

## 2013-02-08 DIAGNOSIS — R55 Syncope and collapse: Principal | ICD-10-CM | POA: Diagnosis present

## 2013-02-08 DIAGNOSIS — M542 Cervicalgia: Secondary | ICD-10-CM | POA: Diagnosis present

## 2013-02-08 DIAGNOSIS — N3010 Interstitial cystitis (chronic) without hematuria: Secondary | ICD-10-CM | POA: Diagnosis present

## 2013-02-08 DIAGNOSIS — M19019 Primary osteoarthritis, unspecified shoulder: Secondary | ICD-10-CM | POA: Diagnosis not present

## 2013-02-08 DIAGNOSIS — I129 Hypertensive chronic kidney disease with stage 1 through stage 4 chronic kidney disease, or unspecified chronic kidney disease: Secondary | ICD-10-CM | POA: Diagnosis not present

## 2013-02-08 DIAGNOSIS — J449 Chronic obstructive pulmonary disease, unspecified: Secondary | ICD-10-CM | POA: Diagnosis present

## 2013-02-08 DIAGNOSIS — R339 Retention of urine, unspecified: Secondary | ICD-10-CM | POA: Diagnosis present

## 2013-02-08 DIAGNOSIS — R51 Headache: Secondary | ICD-10-CM | POA: Diagnosis not present

## 2013-02-08 DIAGNOSIS — F3289 Other specified depressive episodes: Secondary | ICD-10-CM | POA: Diagnosis present

## 2013-02-08 DIAGNOSIS — J4489 Other specified chronic obstructive pulmonary disease: Secondary | ICD-10-CM | POA: Diagnosis present

## 2013-02-08 DIAGNOSIS — M25519 Pain in unspecified shoulder: Secondary | ICD-10-CM | POA: Diagnosis not present

## 2013-02-08 DIAGNOSIS — M12811 Other specific arthropathies, not elsewhere classified, right shoulder: Secondary | ICD-10-CM

## 2013-02-08 LAB — URINE MICROSCOPIC-ADD ON

## 2013-02-08 LAB — COMPREHENSIVE METABOLIC PANEL
BUN: 24 mg/dL — ABNORMAL HIGH (ref 6–23)
CO2: 25 mEq/L (ref 19–32)
Chloride: 102 mEq/L (ref 96–112)
Creatinine, Ser: 1.12 mg/dL — ABNORMAL HIGH (ref 0.50–1.10)
GFR calc non Af Amer: 42 mL/min — ABNORMAL LOW (ref >90)
Glucose, Bld: 102 mg/dL — ABNORMAL HIGH (ref 70–99)
Sodium: 140 mEq/L (ref 135–145)
Total Bilirubin: 0.2 mg/dL — ABNORMAL LOW (ref 0.3–1.2)
Total Protein: 6.7 g/dL (ref 6.0–8.3)

## 2013-02-08 LAB — POCT I-STAT TROPONIN I: Troponin i, poc: 0.09 ng/mL (ref 0.00–0.08)

## 2013-02-08 LAB — CBC WITH DIFFERENTIAL/PLATELET
Eosinophils Absolute: 0.1 10*3/uL (ref 0.0–0.7)
HCT: 37 % (ref 36.0–46.0)
Hemoglobin: 12.3 g/dL (ref 12.0–15.0)
Lymphocytes Relative: 20 % (ref 12–46)
Monocytes Absolute: 0.5 10*3/uL (ref 0.1–1.0)
Monocytes Relative: 6 % (ref 3–12)
Neutro Abs: 6.2 10*3/uL (ref 1.7–7.7)
WBC: 8.5 10*3/uL (ref 4.0–10.5)

## 2013-02-08 LAB — URINALYSIS, ROUTINE W REFLEX MICROSCOPIC
Bilirubin Urine: NEGATIVE
Ketones, ur: NEGATIVE mg/dL
Nitrite: NEGATIVE
Protein, ur: NEGATIVE mg/dL
Specific Gravity, Urine: 1.009 (ref 1.005–1.030)
Urobilinogen, UA: 0.2 mg/dL (ref 0.0–1.0)

## 2013-02-08 MED ORDER — ASPIRIN 325 MG PO TABS
325.0000 mg | ORAL_TABLET | Freq: Once | ORAL | Status: AC
  Administered 2013-02-08: 325 mg via ORAL
  Filled 2013-02-08: qty 1

## 2013-02-08 MED ORDER — ACETAMINOPHEN 325 MG PO TABS
650.0000 mg | ORAL_TABLET | Freq: Once | ORAL | Status: AC
  Administered 2013-02-08: 650 mg via ORAL
  Filled 2013-02-08: qty 2

## 2013-02-08 NOTE — ED Notes (Signed)
Critical Troponin reported to Dr.Yao

## 2013-02-08 NOTE — Telephone Encounter (Signed)
Form completed and given to [place].

## 2013-02-08 NOTE — ED Notes (Signed)
Pt arrives via EMS. Pt c/o dizzyness and family reports that they found pt on floor unconscious with right leg extended straight for about 1 minute. No jerking or tremors reported. Pt c/o dizzyness and heavy sensation on back of head. No deficits, no tenderness. BP on arrival 236/110. Repeat 145/70. Pt on HTN meds until 1 year ago. Pt normal is [id number] systolic. 12lead unremarkable. Hx of syncope related to vertigo. CBG 138. Pt does not speak english. Daughter in room translating.

## 2013-02-08 NOTE — H&P (Signed)
[facility] Admission History and Physical Service Pager: [phone]  [name] Medical record number: [id number] Date of birth: 07-Nov-1919 Age: 79 y.o. Gender: female  Primary Care Provider: [name], MD Consultants: Cardiology Code Status: Full  Chief Complaint: Syncope and fall   Assessment and Plan: Jordan [name] is a 79 y.o. female presenting with syncope. PMH is significant for HTN, hypothyroidism, osteoporosis, depression, lumbar stenosis, and rotator cuff tear.  # Syncope, high on the differential is cardiogenic, per mechanism. Arrhythmia thought to be more likely than ischemia. As well, no vasovagal like event and was not changing position making orthostatic less likely, w/o recent medications to begin.  Pt suddenly lost consciousness transiently without convulsions or prodrome. BG 102, Hgb 12.3. No chest pain or SOB. POC troponin 0.09 in ED. ECG stable from previous study, both with TWI in lateral leads. Previous ECG demonstrated 1st degree AV block and bradycardia. Last echocardiogram in 2010 showed EF 60-70% without evidence of diastolic dysfunction and no valvular abnormalities noted.  - Cardiology recommends ASA without heparin at this time, greatly appreciate recommendations.  Will get Echo today to evaluate for EF and valvular dysfunction  - Repeat troponin now and in 6 hours.  - Repeat ECG in AM - Orthostatic vital signs x1 - Telemetry monitoring  # Hypertension not on any medications at home. Intermittently hypertensive here (162/68) - Monitor vital signs.  - Creatinine is at baseline (1.12) - BMP in AM\ - May benefit from small baseline dose of antihypertensive, will defer or discuss with her PCP, Dr. Caryl Bis.   # History of UTI: currently asymptomatic  - U/A in ED with mild bacteruria not compelling any treatment - Holding toviaz secondary to anticholinergic effects  - Continue to monitor for symptoms  #  Hypothyroidism - Continue home synthroid - TSH in AM  # Depression with insomnia - Continue home zoloft - Continue remeron and trazodone qHS prn  # Rotator cuff tear: Involved in PT twice per week for rehabilitation of this and her back. Complete tear of right supraspinatus and infraspinatus on MRI 10/2012.  - Continue home vicodin 5-325 q4h prn - Continue home voltaren - Continue home lidoderm  # Spinal stenosis - Pain management as above - Continue gabapentin  # Osteoporosis  - Holding fosamax  #CKD Stage III - GFR and Creatinine baseline for pt at around 1.12 and GFR < 60 - Monitor daily for now, hydrate if not taking good PO  FEN/GI: SLIV, Low salt diet Prophylaxis: Subcutaneous heparin  Disposition: Admit to family medicine teaching service, Dr. Ree Kida attending, on telemetry floor.  History of Present Illness: Jordan [name] is a 79 y.o. female presenting with syncope.   The history is given by patient through interpretation by daughter, and by family (her 2 daughters and 1 son).   She was sitting at the dining room table last evening and spontaneously lost consciousness and fell out of a chair onto carpet on her right side. She remained unconscious for about 30 seconds or less per her son who was present immediately after she fell. She denies preceding dizziness, palpitations, CP, SOB, taking any new medicines and no recent illnesses. She has been taking toviaz for urinary retention, vicodin, and gabapentin daily. She has remained hydrated. She has a remote history of vertigo and is not on medication for this. She also has a history of UTIs but denies dysuria or changes in urinary habits.  As well, pt and her  son both report that she was in her normal state of health prior to syncopal episode.  She was not recently sick, no recent travel, and was not recently started on any new medications.  Pt states she felt perfectly normal and then woke up on the floor.  Per her son, who was in  the other room, pt had LOC while sitting upright without change in position/standing/coughing, falling right out of the chair before having return of consciousness and no amnesia.    Further work up in the ED showed no bleeding on head CT, negative C spine, lumbar spine, and right hip CT scans, stable ECG from previous study, and POC troponin was 0.09. Vital signs have been normal and stable and she has not subjectively had any acute complaints.   Review Of Systems: Per HPI as well as: right shoulder pain secondary to rotator cuff tear from earlier this year and back pain both unchanged from prior to fall.   Otherwise 12 point review of systems was performed and was unremarkable.  Patient Active Problem List   Diagnosis Date Noted  . Spinal stenosis of lumbar region 10/03/2012  . Rotator cuff tear arthropathy 09/11/2012  . Annual physical exam 08/28/2012  . Gait instability 08/28/2012  . Xerostomia 01/24/2012  . Fine tremor 12/19/2011  . Dry skin dermatitis 12/19/2011  . Fatigue 10/11/2011  . Lipoma of upper extremity 12/08/2010  . Insomnia 06/14/2010  . LOSS OF WEIGHT 04/05/2010  . MASS, LUNG 04/05/2010  . PERIPHERAL NEUROPATHY 03/29/2010  . INTERSTITIAL CYSTITIS 09/21/2009  . DECREASED HEARING, RIGHT EAR 06/08/2009  . CERVICALGIA 05/18/2009  . ACCIDENTAL FALL, HX OF 12/15/2008  . OSTEOPOROSIS 03/20/2008  . INCOMPLETE BLADDER EMPTYING 09/17/2007  . GALLSTONES 12/27/2006  . HERNIA, INCISIONAL VENTRAL W/O OBST/GNGR 10/25/2006  . HYPOTHYROIDISM, UNSPECIFIED 06/29/2006  . DEPRESSIVE DISORDER, NOS 06/29/2006  . CARPAL TUNNEL SYNDROME 06/29/2006  . HYPERTENSION, BENIGN SYSTEMIC 06/29/2006  . DIVERTICULOSIS OF COLON 06/29/2006  . CYSTOCELE/RECTOCELE/PROLAPSE,UNSPEC. 06/29/2006  . INCONTINENCE, STRESS, FEMALE 06/29/2006  . OSTEOARTHRITIS, MULTI SITES 06/29/2006  . INCONTINENCE, URGE 06/29/2006   Past Medical History: Past Medical History  Diagnosis Date  . Lung mass     right  apical need CT in end of 2012  . Cervicalgia   . Peripheral neuropathy   . Frailty   . Hypertension   . Hypothyroid   . Osteoporosis   . Insomnia   . Arthritis    Past Surgical History: Past Surgical History  Procedure Laterality Date  . Abdominal hysterectomy     Social History: History  Substance Use Topics  . Smoking status: Never Smoker   . Smokeless tobacco: Never Used  . Alcohol Use: No   Additional social history: Lives with son. Daughters lives nearby and see her nearly everyday.  Please also refer to relevant sections of EMR.  Family History: Family History  Problem Relation Age of Onset  . Kidney disease Mother    Allergies and Medications: No Known Allergies No current facility-administered medications on file prior to encounter.   Current Outpatient Prescriptions on File Prior to Encounter  Medication Sig Dispense Refill  . alendronate (FOSAMAX) 70 MG tablet take 1 tablet by mouth every week on an empty stomach with 6-8 oz of water. No food / meds for 30 min. Remain Upright  4 tablet  3  . Calcium Carbonate-Vitamin D (CALTRATE 600+D) 600-400 MG-UNIT per tablet Take 1 tablet by mouth 2 (two) times daily.        Marland Kitchen  diclofenac sodium (VOLTAREN) 1 % GEL Apply 2 g topically 4 (four) times daily.  10 Tube  1  . docusate sodium (COLACE) 100 MG capsule Take 2 capsules (200 mg total) by mouth every 12 (twelve) hours.  60 capsule  0  . fesoterodine (TOVIAZ) 4 MG TB24 Take 2 tablets (8 mg total) by mouth daily.  30 tablet  0  . gabapentin (NEURONTIN) 300 MG capsule Take 1 capsule (300 mg total) by mouth 3 (three) times daily.  270 capsule  2  . Glucosamine-Chondroitin-Vit D3 1500-1200-800 MG-MG-UNIT PACK Take 1 tablet by mouth 2 (two) times daily.        Marland Kitchen HYDROcodone-acetaminophen (NORCO/VICODIN) 5-325 MG per tablet Take 1 tablet by mouth every 4 (four) hours as needed for pain.  30 tablet  0  . levothyroxine (SYNTHROID, LEVOTHROID) 112 MCG tablet take 1 tablet by mouth  once daily  90 tablet  1  . LIDODERM 5 % APPLY 1 PATCH TO SKIN DAILY. MAY CUT PATCH AND USE 1/4 OR 1/2 OF PATCH INSTEAD  90 each  1  . mirtazapine (REMERON) 45 MG tablet Take 1 tablet (45 mg total) by mouth at bedtime.  90 tablet  3  . polyethylene glycol (MIRALAX) packet Take 17 g by mouth daily.  14 each  0  . raloxifene (EVISTA) 60 MG tablet take 1 tablet by mouth once daily  60 tablet  1  . traZODone (DESYREL) 50 MG tablet take 1/2 or 1 tablet by mouth at bedtime if needed for sleep  30 tablet  2    Objective: BP 117/61  Pulse 78  Temp(Src) 98.6 F (37 C) (Oral)  Resp 22  Ht 5' (1.524 m)  Wt 140 lb (63.504 kg)  BMI 27.34 kg/m2  SpO2 97% Exam: General: Well-appearing elderly female sitting in bed in NAD HEENT: PERRL, no conjunctival pallor, oropharynx clear with dentures, no tongue laceration, small posterior occipital hematoma Cardiovascular: RRR, no murmur, rub or gallop heard. No edema, 2+ DP and radial pulses, cap refill < 3 sec.  Respiratory: Non-labored, CTAB Abdomen: Normoactive BS, soft, NT, ND Extremities: WWP, No point tenderness along R humeral shaft. Right hip and pelvis without point tenderness, full ROM at hip, legs equal length without rotation Skin: No lacerations or abrasions noted Neuro: AOx3, CN II-XII intact, slow but normal finger-to-nose, lower extremity strength 5/5 and sensation intact to light touch.  DTR + 2 B/L LE  Labs and Imaging: CBC BMET   Recent Labs Lab 02/08/13 2016  WBC 8.5  HGB 12.3  HCT 37.0  PLT 166    Recent Labs Lab 02/08/13 2016  NA 140  K 4.5  CL 102  CO2 25  BUN 24*  CREATININE 1.12*  GLUCOSE 102*  CALCIUM 9.0      02/08/2013 22:21  Color, Urine YELLOW  APPearance CLOUDY (A)  Specific Gravity, Urine 1.009  pH 7.5  Glucose NEGATIVE  Bilirubin Urine NEGATIVE  Ketones, ur NEGATIVE  Protein NEGATIVE  Urobilinogen, UA 0.2  Nitrite NEGATIVE  Leukocytes, UA SMALL (A)  Hgb urine dipstick SMALL (A)  WBC, UA 3-6   RBC / HPF 3-6  Bacteria, UA RARE   ECG Date: 02/09/2013  Rate: 77  Rhythm: normal sinus rhythm  QRS Axis: normal  Intervals: normal  ST/T Wave abnormalities: TWI lateral leads  Conduction Disutrbances:none  Narrative Interpretation:  Old EKG Reviewed: 10/18/2010 with lateral lead TWI. HR 59 with 1st degree AV block (PR interval 218 msec) at that time. QTc  wnl.   CXR FINDINGS:  The cardiac silhouette remains borderline enlarged. The lungs remain  mildly hyperexpanded with diffuse peribronchial thickening and  accentuation of the interstitial markings. Thoracic spine  degenerative changes. Superior migration of both humeral heads,  greater on the right with some associated degenerative changes. No  fracture or pneumothorax seen.  IMPRESSION:  Stable changes of COPD and chronic bronchitis. No acute abnormality.  XR Pelvis FINDINGS:  No fracture or dislocation seen. Lower lumbar spine degenerative  changes and scoliosis. Stable calcified enchondroma or infarct in  the proximal right femur.  IMPRESSION:  No fracture or dislocation.  CT HEAD WITHOUT CONTRAST  CT CERVICAL SPINE WITHOUT CONTRAST  FINDINGS:  CT HEAD FINDINGS  Atrophy and chronic small-vessel white matter ischemic changes are  again noted. A left supraclinoid internal carotid artery aneurysm  appears unchanged.  No acute intracranial abnormalities are identified, including mass  lesion or mass effect, hydrocephalus, extra-axial fluid collection,  midline shift, hemorrhage, or acute infarction.  The visualized bony calvarium is unremarkable.  CT CERVICAL SPINE FINDINGS  5 mm degenerative anteriolisthesis at C5-C6 is unchanged.  There is no evidence of acute fracture or new subluxation.  Severe degenerative disc disease at C5-6 and C6-7 noted.  Moderate facet arthropathy throughout the cervical spine again  noted.  No focal bony lesions are identified.  IMPRESSION:  CT HEAD IMPRESSION:  No evidence of acute  intracranial abnormality.  CT CERVICAL SPINE IMPRESSION:  No evidence of acute abnormality within the cervical spine.  Severe degenerative changes from C5-C7.  Vance Gather, MD 02/08/2013, 10:28 PM PGY-1, [facility] pager: [phone], text pages welcome  I saw and evaluated Ms. Cullipher with Dr. Bonner Puna, please refer to his excellent note for details.  As well, my additions can be found in red.  Tamela Oddi Awanda Mink, DO of [facility] 02/09/2013, 6:52 AM

## 2013-02-08 NOTE — ED Notes (Signed)
Pt taken to xray 

## 2013-02-09 ENCOUNTER — Encounter (HOSPITAL_COMMUNITY): Payer: Self-pay | Admitting: Emergency Medicine

## 2013-02-09 ENCOUNTER — Observation Stay (HOSPITAL_COMMUNITY): Payer: Medicare Other

## 2013-02-09 DIAGNOSIS — I129 Hypertensive chronic kidney disease with stage 1 through stage 4 chronic kidney disease, or unspecified chronic kidney disease: Secondary | ICD-10-CM | POA: Diagnosis not present

## 2013-02-09 DIAGNOSIS — I10 Essential (primary) hypertension: Secondary | ICD-10-CM | POA: Diagnosis not present

## 2013-02-09 DIAGNOSIS — E039 Hypothyroidism, unspecified: Secondary | ICD-10-CM | POA: Diagnosis not present

## 2013-02-09 DIAGNOSIS — M19019 Primary osteoarthritis, unspecified shoulder: Secondary | ICD-10-CM | POA: Diagnosis not present

## 2013-02-09 DIAGNOSIS — R55 Syncope and collapse: Secondary | ICD-10-CM | POA: Diagnosis not present

## 2013-02-09 DIAGNOSIS — M25519 Pain in unspecified shoulder: Secondary | ICD-10-CM | POA: Diagnosis not present

## 2013-02-09 DIAGNOSIS — S4980XA Other specified injuries of shoulder and upper arm, unspecified arm, initial encounter: Secondary | ICD-10-CM | POA: Diagnosis not present

## 2013-02-09 DIAGNOSIS — N3010 Interstitial cystitis (chronic) without hematuria: Secondary | ICD-10-CM | POA: Diagnosis not present

## 2013-02-09 HISTORY — DX: Syncope and collapse: R55

## 2013-02-09 LAB — BASIC METABOLIC PANEL
Calcium: 8.9 mg/dL (ref 8.4–10.5)
Creatinine, Ser: 1.19 mg/dL — ABNORMAL HIGH (ref 0.50–1.10)
GFR calc non Af Amer: 39 mL/min — ABNORMAL LOW (ref >90)
Glucose, Bld: 87 mg/dL (ref 70–99)
Sodium: 139 mEq/L (ref 135–145)

## 2013-02-09 LAB — TROPONIN I
Troponin I: <0.3 ng/mL (ref <0.30)
Troponin I: <0.3 ng/mL (ref <0.30)
Troponin I: <0.3 ng/mL (ref <0.30)

## 2013-02-09 LAB — TSH: TSH: 1.472 u[IU]/mL (ref 0.350–4.500)

## 2013-02-09 MED ORDER — SODIUM CHLORIDE 0.9 % IJ SOLN
3.0000 mL | Freq: Two times a day (BID) | INTRAMUSCULAR | Status: DC
  Administered 2013-02-09 – 2013-02-11 (×5): 3 mL via INTRAVENOUS

## 2013-02-09 MED ORDER — LEVOTHYROXINE SODIUM 112 MCG PO TABS
112.0000 ug | ORAL_TABLET | Freq: Every day | ORAL | Status: DC
  Administered 2013-02-09 – 2013-02-11 (×3): 112 ug via ORAL
  Filled 2013-02-09 (×4): qty 1

## 2013-02-09 MED ORDER — SERTRALINE HCL 50 MG PO TABS
50.0000 mg | ORAL_TABLET | Freq: Every day | ORAL | Status: DC
  Administered 2013-02-09 – 2013-02-11 (×3): 50 mg via ORAL
  Filled 2013-02-09 (×3): qty 1

## 2013-02-09 MED ORDER — POLYETHYLENE GLYCOL 3350 17 G PO PACK
17.0000 g | PACK | Freq: Every day | ORAL | Status: DC
  Administered 2013-02-09 – 2013-02-11 (×2): 17 g via ORAL
  Filled 2013-02-09 (×3): qty 1

## 2013-02-09 MED ORDER — INFLUENZA VAC SPLIT QUAD 0.5 ML IM SUSP
0.5000 mL | INTRAMUSCULAR | Status: AC
  Administered 2013-02-10: 0.5 mL via INTRAMUSCULAR
  Filled 2013-02-09: qty 0.5

## 2013-02-09 MED ORDER — HYDROCODONE-ACETAMINOPHEN 5-325 MG PO TABS
1.0000 | ORAL_TABLET | ORAL | Status: DC | PRN
  Administered 2013-02-09 – 2013-02-10 (×2): 1 via ORAL
  Filled 2013-02-09 (×2): qty 1

## 2013-02-09 MED ORDER — ASPIRIN EC 325 MG PO TBEC
325.0000 mg | DELAYED_RELEASE_TABLET | Freq: Every day | ORAL | Status: DC
  Administered 2013-02-09 – 2013-02-11 (×3): 325 mg via ORAL
  Filled 2013-02-09 (×3): qty 1

## 2013-02-09 MED ORDER — HEPARIN SODIUM (PORCINE) 5000 UNIT/ML IJ SOLN
5000.0000 [IU] | Freq: Three times a day (TID) | INTRAMUSCULAR | Status: DC
  Administered 2013-02-09 – 2013-02-11 (×7): 5000 [IU] via SUBCUTANEOUS
  Filled 2013-02-09 (×10): qty 1

## 2013-02-09 MED ORDER — DOCUSATE SODIUM 100 MG PO CAPS
200.0000 mg | ORAL_CAPSULE | Freq: Two times a day (BID) | ORAL | Status: DC
  Administered 2013-02-09 – 2013-02-11 (×6): 200 mg via ORAL
  Filled 2013-02-09 (×7): qty 2

## 2013-02-09 MED ORDER — LIDOCAINE 5 % EX PTCH
1.0000 | MEDICATED_PATCH | CUTANEOUS | Status: DC
  Administered 2013-02-09 – 2013-02-11 (×3): 1 via TRANSDERMAL
  Filled 2013-02-09 (×3): qty 1

## 2013-02-09 MED ORDER — RALOXIFENE HCL 60 MG PO TABS
60.0000 mg | ORAL_TABLET | Freq: Every day | ORAL | Status: DC
  Administered 2013-02-09 – 2013-02-11 (×3): 60 mg via ORAL
  Filled 2013-02-09 (×3): qty 1

## 2013-02-09 MED ORDER — GABAPENTIN 300 MG PO CAPS
300.0000 mg | ORAL_CAPSULE | Freq: Three times a day (TID) | ORAL | Status: DC
  Administered 2013-02-09 – 2013-02-11 (×8): 300 mg via ORAL
  Filled 2013-02-09 (×9): qty 1

## 2013-02-09 MED ORDER — TRAZODONE HCL 50 MG PO TABS
50.0000 mg | ORAL_TABLET | Freq: Every evening | ORAL | Status: DC | PRN
  Administered 2013-02-09: 50 mg via ORAL
  Filled 2013-02-09: qty 1

## 2013-02-09 MED ORDER — DICLOFENAC SODIUM 1 % TD GEL
2.0000 g | Freq: Four times a day (QID) | TRANSDERMAL | Status: DC
  Administered 2013-02-09 – 2013-02-11 (×8): 2 g via TOPICAL
  Filled 2013-02-09: qty 100

## 2013-02-09 MED ORDER — MIRTAZAPINE 45 MG PO TABS
45.0000 mg | ORAL_TABLET | Freq: Every day | ORAL | Status: DC
  Administered 2013-02-09 – 2013-02-10 (×2): 45 mg via ORAL
  Filled 2013-02-09 (×3): qty 1

## 2013-02-09 NOTE — Progress Notes (Signed)
Instructed pt on how to use Incentive spirometer and the purpose of it. Pt returned demonstration successfully. Pt's son at bedside and able to translate.

## 2013-02-09 NOTE — Progress Notes (Signed)
Family Medicine Teaching Service Daily Progress Note Intern Pager: [phone]  [name] Medical record number: [id number] Date of birth: 07-Feb-1920 Age: 79 y.o. Gender: female  Primary Care Provider: [name], MD Consultants: cardiology Code Status: FULL  Pt Overview and Major Events to Date:  10/10 Admitted and cardiology consulted, recommending echo   Assessment and Plan: Jordan [name] is a 79 y.o. female presenting with syncope. PMH is significant for HTN, hypothyroidism, osteoporosis, depression, lumbar stenosis, and rotator cuff tear.  # Syncope - concern for cardiogenic per hx, arrhythmia more likely than ischemia, given sudden loss of consciousness without prodrome, convulsions, or neurologic sequelae, nl BG and Hgb, no chest pain or SOB. POC troponin mildly elevated, f/u troponin I negative x 1.  - EKG in ED stable from prior with TWI in lateral leads - Previous EKG with 1st deg AV block and brady - Echo 2010 with EF 60-70% without evidence of diastolic dysfunction and no valvular abnormalities - Cardiology consulted, recommending Aspirin without heparin and echocardiogram - ordered this morning - F/u troponin x 2 - F/u repeat EKG this morning and telemetry strip - VS are NOT orthostatic - Holding tiovaz due to anticholinergic effect  # Hypothyroidism - Continued home sintrhoid - F/u TSH  # CKD III - GFR and Cr at baseline around 1.12 with GFR <60 on admission - Cr today 1.19 - F/u BMET daily - Hydrate if not good PO  # HTN not on medication - intermittently hyertensive here to [id number] systolic - Monitor VS - Cr at baseline - Consider antihypertensive, defer to outpatient management by PCP  # Rotator cuff tear - Has twice weekly PT. Complete tear right supraspinatus and infraspinatus on 10/2012 MRI - Continue home vicodin prn, voltaren, lidoderm patch - With fall and slightly increased right shoulder pain in osteoporotic pt, will check right shoulder  xray today  # Depression/insomnia - continuing home medications # Spinal stenosis - Continue above home meds and gabapentin # Osteoporosis - Holding fosamax  FEN/GI: SLIV, low salt diet PPX: Subcutaneous heparin, ordering incentive spirometry  Disposition: Observation, pending cardiac workup - F/u PT / OT recs  Subjective: Patient doing well today with some dizziness last night but none today, no chest pain, syncope, or dyspnea. Sleepy now because did not get much rest overnight. Son in room. Interviewed in [place].  Objective: Temp:  [98.4 F (36.9 C)-98.6 F (37 C)] 98.4 F (36.9 C) (10/11 0200) Pulse Rate:  [59-86] 63 (10/11 0322) Resp:  [15-22] 18 (10/11 0200) BP: (113-176)/(48-91) 126/81 mmHg (10/11 0322) SpO2:  [93 %-97 %] 94 % (10/11 0319) Weight:  [134 lb (60.782 kg)-140 lb (63.504 kg)] 134 lb (60.782 kg) (10/11 0200) Physical Exam: General: NAD, pleasant, Spanish-speaking Cardiovascular: RRR though heart sounds distant, no murmurs, rubs, or gallops auscultated Respiratory: CTAB with no wheezes or crackles, normal effort Abdomen: Soft, nontender, nondistended, NABS Extremities: No LE edema, 2+ bilateral DP pulses, able to move extremities; right shoulder tender but able to support weight on it when pushing self up to seated. NEURO: Awake, alert, no gross deficits, normal speech, normal tone to sit up, defer ambulation given uses walker and tired now. EOMI.  Laboratory:  Recent Labs Lab 02/08/13 2016  WBC 8.5  HGB 12.3  HCT 37.0  PLT 166    Recent Labs Lab 02/08/13 2016 02/09/13 0620  NA 140 139  K 4.5 3.9  CL 102 101  CO2 25 30  BUN 24* 24*  CREATININE 1.12*  1.19*  CALCIUM 9.0 8.9  PROT 6.7  --   BILITOT 0.2*  --   ALKPHOS 91  --   ALT 16  --   AST 32  --   GLUCOSE 102* 87     Recent Labs Lab 02/09/13 0620  TROPONINI <0.30     Hilton Sinclair, MD 02/09/2013, 9:04 AM PGY-2, [facility] Intern pager: [phone],  text pages welcome

## 2013-02-09 NOTE — H&P (Signed)
FMTS Attending Note  I personally saw and evaluated the patient. The plan of care was discussed with the resident team. I agree with the assessment and plan as documented by the resident.   Gen - speech fluid, NAD, Hispanic female Cardiac - RRR, no murmurs, no heaves/thrills, no JVD, no carotid bruits Resp - CTAB, normal effort Abd - soft, nontender, bowel sounds present Ext -no LE edema MSK - decreased ROM of right shoulder to abduction and forward flexion, decreased ROM to internal/external rotation of right shoulder weakness with empty can testing, lift off test negative, able to reach T10 on back with right hand Neuro: CN 2-12 intact, strength 5/5 in all extremities, sensation to light touch intact in all extremities, see resident neuro examaination  1. Syncope -agree with cardiac workup as outlined in resident note, appreciate cardiology input, no plan for MRI of brain as no focal neurologic deficits appreciated on examination 2. HTN - monitor, consider low dose antihypertensive if hypertension persists 3. Hypothyroidism - agree with recheck TSH 4. Right arm pain/history of rotator cuff tear - check xray shoulder as pain is worsened after fall on right side (hitory of osteoporosis, rule out fracture). 5. CKD - stable 6. Pain management - lidoderm patch and vicodin prn  Dossie Arbour MD

## 2013-02-09 NOTE — Progress Notes (Signed)
PT Cancellation Note  Patient Details Name: Jordan [name] MRN: [id number] DOB: 1919-11-11   Cancelled Treatment:    Reason Eval/Treat Not Completed: Fatigue/lethargy limiting ability to participate.  Attempted to see patient again this afternoon.  She had just returned to bed and deferred evaluation.  Will see in am.   Shanna Cisco 02/09/2013, 2:48 PM

## 2013-02-09 NOTE — ED Provider Notes (Signed)
CSN: [id number]     Arrival date & time 02/08/13  2005 History   First MD Initiated Contact with Patient 02/08/13 2014     Chief Complaint  Patient presents with  . Fall   (Consider location/radiation/quality/duration/timing/severity/associated sxs/prior Treatment) The history is provided by the patient. The history is limited by a language barrier. A language interpreter was used.  LAM STILES is a 79 y.o. female history of hypertension, arthritis here presenting with syncope. She was sitting at the dinner table and then suddenly passed out. She fell and hit her head and had some dizziness prior to the episode. Denies any chest pain or shortness of breath.    Past Medical History  Diagnosis Date  . Lung mass     right apical need CT in end of 2012  . Cervicalgia   . Peripheral neuropathy   . Frailty   . Hypertension   . Hypothyroid   . Osteoporosis   . Insomnia   . Arthritis    Past Surgical History  Procedure Laterality Date  . Abdominal hysterectomy     Family History  Problem Relation Age of Onset  . Kidney disease Mother    History  Substance Use Topics  . Smoking status: Never Smoker   . Smokeless tobacco: Never Used  . Alcohol Use: No   OB History   Grav Para Term Preterm Abortions TAB SAB Ect Mult Living                 Review of Systems  Neurological: Positive for syncope.  All other systems reviewed and are negative.    Allergies  Review of patient's allergies indicates no known allergies.  Home Medications   Current Outpatient Rx  Name  Route  Sig  Dispense  Refill  . alendronate (FOSAMAX) 70 MG tablet      take 1 tablet by mouth every week on an empty stomach with 6-8 oz of water. No food / meds for 30 min. Remain Upright   4 tablet   3   . Calcium Carbonate-Vitamin D (CALTRATE 600+D) 600-400 MG-UNIT per tablet   Oral   Take 1 tablet by mouth 2 (two) times daily.           . diclofenac sodium (VOLTAREN) 1 % GEL   Topical   Apply 2 g  topically 4 (four) times daily.   10 Tube   1   . docusate sodium (COLACE) 100 MG capsule   Oral   Take 2 capsules (200 mg total) by mouth every 12 (twelve) hours.   60 capsule   0   . fesoterodine (TOVIAZ) 4 MG TB24   Oral   Take 2 tablets (8 mg total) by mouth daily.   30 tablet   0   . gabapentin (NEURONTIN) 300 MG capsule   Oral   Take 1 capsule (300 mg total) by mouth 3 (three) times daily.   270 capsule   2   . Glucosamine-Chondroitin-Vit D3 1500-1200-800 MG-MG-UNIT PACK   Oral   Take 1 tablet by mouth 2 (two) times daily.           Marland Kitchen HYDROcodone-acetaminophen (NORCO/VICODIN) 5-325 MG per tablet   Oral   Take 1 tablet by mouth every 4 (four) hours as needed for pain.   30 tablet   0   . levothyroxine (SYNTHROID, LEVOTHROID) 112 MCG tablet      take 1 tablet by mouth once daily   90 tablet  1   . LIDODERM 5 %      APPLY 1 PATCH TO SKIN DAILY. MAY CUT PATCH AND USE 1/4 OR 1/2 OF PATCH INSTEAD   90 each   1   . mirtazapine (REMERON) 45 MG tablet   Oral   Take 1 tablet (45 mg total) by mouth at bedtime.   90 tablet   3   . polyethylene glycol (MIRALAX) packet   Oral   Take 17 g by mouth daily.   14 each   0   . raloxifene (EVISTA) 60 MG tablet      take 1 tablet by mouth once daily   60 tablet   1   . sertraline (ZOLOFT) 50 MG tablet      take 1 and 1/2 tablets by mouth once daily         . traZODone (DESYREL) 50 MG tablet      take 1/2 or 1 tablet by mouth at bedtime if needed for sleep   30 tablet   2    BP 149/59  Pulse 72  Temp(Src) 98.6 F (37 C) (Oral)  Resp 19  Ht 5' (1.524 m)  Wt 140 lb (63.504 kg)  BMI 27.34 kg/m2  SpO2 94% Physical Exam  Nursing note and vitals reviewed. Constitutional: She is oriented to person, place, and time. She appears well-developed and well-nourished.  HENT:  Head: Normocephalic and atraumatic.  Mouth/Throat: Oropharynx is clear and moist.  Eyes: Conjunctivae are normal. Pupils are equal,  round, and reactive to light.  Neck: Normal range of motion. Neck supple.  Cardiovascular: Normal rate, regular rhythm and normal heart sounds.   Pulmonary/Chest: Effort normal and breath sounds normal. No respiratory distress. She has no wheezes. She has no rales.  Abdominal: Soft. Bowel sounds are normal. She exhibits no distension. There is no tenderness. There is no guarding.  Musculoskeletal: Normal range of motion.  Neurological: She is alert and oriented to person, place, and time. No cranial nerve deficit. Coordination normal.  Skin: Skin is warm and dry.  Psychiatric: She has a normal mood and affect. Her behavior is normal. Judgment and thought content normal.    ED Course  Procedures (including critical care time) CRITICAL CARE Performed by: Darl Householder, Temeca Somma   Total critical care time: 30 min   Critical care time was exclusive of separately billable procedures and treating other patients.  Critical care was necessary to treat or prevent imminent or life-threatening deterioration.  Critical care was time spent personally by me on the following activities: development of treatment plan with patient and/or surrogate as well as nursing, discussions with consultants, evaluation of patient's response to treatment, examination of patient, obtaining history from patient or surrogate, ordering and performing treatments and interventions, ordering and review of laboratory studies, ordering and review of radiographic studies, pulse oximetry and re-evaluation of patient's condition.   Labs Review Labs Reviewed  CBC WITH DIFFERENTIAL - Abnormal; Notable for the following:    RBC 3.71 (*)    All other components within normal limits  COMPREHENSIVE METABOLIC PANEL - Abnormal; Notable for the following:    Glucose, Bld 102 (*)    BUN 24 (*)    Creatinine, Ser 1.12 (*)    Albumin 3.0 (*)    Total Bilirubin 0.2 (*)    GFR calc non Af Amer 42 (*)    GFR calc Af Amer 48 (*)    All other  components within normal limits  URINALYSIS, ROUTINE W REFLEX  MICROSCOPIC - Abnormal; Notable for the following:    APPearance CLOUDY (*)    Hgb urine dipstick SMALL (*)    Leukocytes, UA SMALL (*)    All other components within normal limits  POCT I-STAT TROPONIN I - Abnormal; Notable for the following:    Troponin i, poc 0.09 (*)    All other components within normal limits  URINE MICROSCOPIC-ADD ON   Imaging Review Dg Chest 1 View  02/08/2013   CLINICAL DATA:  [place]. Possible altered mental status.  EXAM: CHEST - 1 VIEW  COMPARISON:  06/10/2009 and chest CT dated 04/16/2010.  FINDINGS: The cardiac silhouette remains borderline enlarged. The lungs remain mildly hyperexpanded with diffuse peribronchial thickening and accentuation of the interstitial markings. Thoracic spine degenerative changes. Superior migration of both humeral heads, greater on the right with some associated degenerative changes. No fracture or pneumothorax seen.  IMPRESSION: Stable changes of COPD and chronic bronchitis. No acute abnormality.   Electronically Signed   By: Enrique Sack M.D.   On: 02/08/2013 21:33   Dg Pelvis 1-2 Views  02/08/2013   CLINICAL DATA:  [place]. Possible altered mental status.  EXAM: PELVIS - 1-2 VIEW  COMPARISON:  10/24/2012 and CT dated 01/21/2013.  FINDINGS: No fracture or dislocation seen. Lower lumbar spine degenerative changes and scoliosis. Stable calcified enchondroma or infarct in the proximal right femur.  IMPRESSION: No fracture or dislocation.   Electronically Signed   By: Enrique Sack M.D.   On: 02/08/2013 21:34   Ct Head Wo Contrast  02/08/2013   CLINICAL DATA:  79 year old female with loss of consciousness, dizziness, headache and found on floor.  EXAM: CT HEAD WITHOUT CONTRAST  CT CERVICAL SPINE WITHOUT CONTRAST  TECHNIQUE: Multidetector CT imaging of the head and cervical spine was performed following the standard protocol without intravenous contrast. Multiplanar CT image  reconstructions of the cervical spine were also generated.  COMPARISON:  07/01/2010 and prior head CTs. 04/02/2010 cervical spine MR.  FINDINGS: CT HEAD FINDINGS  Atrophy and chronic small-vessel white matter ischemic changes are again noted. A left supraclinoid internal carotid artery aneurysm appears unchanged.  No acute intracranial abnormalities are identified, including mass lesion or mass effect, hydrocephalus, extra-axial fluid collection, midline shift, hemorrhage, or acute infarction.  The visualized bony calvarium is unremarkable.  CT CERVICAL SPINE FINDINGS  5 mm degenerative anteriolisthesis at C5-C6 is unchanged.  There is no evidence of acute fracture or new subluxation.  Severe degenerative disc disease at C5-6 and C6-7 noted.  Moderate facet arthropathy throughout the cervical spine again noted.  No focal bony lesions are identified.  IMPRESSION: CT HEAD IMPRESSION:  No evidence of acute intracranial abnormality.  CT CERVICAL SPINE IMPRESSION:  No evidence of acute abnormality within the cervical spine.  Severe degenerative changes from C5-C7.   Electronically Signed   By: Hassan Rowan M.D.   On: 02/08/2013 21:53   Ct Cervical Spine Wo Contrast  02/08/2013   CLINICAL DATA:  79 year old female with loss of consciousness, dizziness, headache and found on floor.  EXAM: CT HEAD WITHOUT CONTRAST  CT CERVICAL SPINE WITHOUT CONTRAST  TECHNIQUE: Multidetector CT imaging of the head and cervical spine was performed following the standard protocol without intravenous contrast. Multiplanar CT image reconstructions of the cervical spine were also generated.  COMPARISON:  07/01/2010 and prior head CTs. 04/02/2010 cervical spine MR.  FINDINGS: CT HEAD FINDINGS  Atrophy and chronic small-vessel white matter ischemic changes are again noted. A left supraclinoid internal carotid artery aneurysm  appears unchanged.  No acute intracranial abnormalities are identified, including mass lesion or mass effect, hydrocephalus,  extra-axial fluid collection, midline shift, hemorrhage, or acute infarction.  The visualized bony calvarium is unremarkable.  CT CERVICAL SPINE FINDINGS  5 mm degenerative anteriolisthesis at C5-C6 is unchanged.  There is no evidence of acute fracture or new subluxation.  Severe degenerative disc disease at C5-6 and C6-7 noted.  Moderate facet arthropathy throughout the cervical spine again noted.  No focal bony lesions are identified.  IMPRESSION: CT HEAD IMPRESSION:  No evidence of acute intracranial abnormality.  CT CERVICAL SPINE IMPRESSION:  No evidence of acute abnormality within the cervical spine.  Severe degenerative changes from C5-C7.   Electronically Signed   By: Hassan Rowan M.D.   On: 02/08/2013 21:53    EKG Interpretation   None       Date: 02/09/2013  Rate: 77  Rhythm: normal sinus rhythm  QRS Axis: normal  Intervals: normal  ST/T Wave abnormalities: TWI lateral leads  Conduction Disutrbances:none  Narrative Interpretation:   Old EKG Reviewed: none available    MDM   1. Syncope   2. NSTEMI (non-ST elevated myocardial infarction)    JONIQUE TARLETON is a 79 y.o. female here with syncope. Trop elevated, concerned for possible NSTEMI vs ACS. CT head/neck unremarkable. I talked with cardiology, who recommend holding off on heparin and give ASA. Will admit to tele for serial troponins.     Wandra Arthurs, MD 02/09/13 [phone]

## 2013-02-09 NOTE — Progress Notes (Signed)
PT Cancellation Note  Patient Details Name: Jordan [name] MRN: [id number] DOB: 03-07-1920   Cancelled Treatment:    Reason Eval/Treat Not Completed: Fatigue/lethargy limiting ability to participate.  Pt did not sleep much last night and requested hold PT this am.  Will try to come back later today or tomorrow as time allows.     Catarina Hartshorn, [place] 02/09/2013, 9:57 AM

## 2013-02-09 NOTE — Progress Notes (Addendum)
FMTS Attending Note  I personally saw and evaluated the patient. The plan of care was discussed with the resident team. I agree with the assessment and plan as documented by the resident.   Cardiology recommending carotid dopplers. Echocardiogram pending.   Dossie Arbour MD

## 2013-02-09 NOTE — Consult Note (Signed)
CARDIOLOGY CONSULT NOTE  Patient ID: Jordan [name], MRN: [id number], DOB/AGE: July 15, 1919 79 y.o. Admit date: 02/08/2013 Date of Consult: 02/09/2013  Primary Physician: [name], MD Primary Cardiologist: None  Chief Complaint: syncope Reason for Consultation: syncope, borderline elevated troponin  HPI: 79 y.o. female w/ PMHx significant for depression, osteoporesis but no cardiac history who presented to [facility] on 02/09/2013 with complaints of syncope.  History is provided via interpretation by patient's son. In regards to the patient's cardiac history, she denies any heart problems. Approx 10 years ago she had a stress test due to an abnormal EKG and an echo about 5 yrs ago but otherwise, no history of MI, CHF or arrhythmias. No history of syncope.   Her episode of syncope occurred this evening while her son was preparing dinner for her. The actual event was unwitnessed as the son had his back to her at the time. She was feeling fine and seated at the table. Her son then heard a thud and found his mother on the ground next to the table. She does not recall the event and denies getting up from the table or warning prodrome. She became arousable in about 30 secs and back to baseline besides fatigue in another 2-3 minutes. No injury, no tongue biting, no loss of bowels or urine, no tonic/clonic activity, no vision changes, no focal weakness.  Currently, she feels tired but otherwise is without complaints. No chest pain, palpitations, SOB, PND, orthopnea.    Past Medical History  Diagnosis Date  . Lung mass     right apical need CT in end of 2012  . Cervicalgia   . Peripheral neuropathy   . Frailty   . Hypertension   . Hypothyroid   . Osteoporosis   . Insomnia   . Arthritis       Surgical History:  Past Surgical History  Procedure Laterality Date  . Abdominal hysterectomy       Home Meds: Prior to Admission medications   Medication Sig Start Date End Date  Taking? Authorizing Provider  alendronate (FOSAMAX) 70 MG tablet take 1 tablet by mouth every week on an empty stomach with 6-8 oz of water. No food / meds for 30 min. Remain Upright 12/11/12  Yes Bryan R Hess, DO  Calcium Carbonate-Vitamin D (CALTRATE 600+D) 600-400 MG-UNIT per tablet Take 1 tablet by mouth 2 (two) times daily.     Yes Historical Provider, MD  diclofenac sodium (VOLTAREN) 1 % GEL Apply 2 g topically 4 (four) times daily. 09/23/12  Yes Rachell Cipro, MD  docusate sodium (COLACE) 100 MG capsule Take 2 capsules (200 mg total) by mouth every 12 (twelve) hours. 09/23/12  Yes Rachell Cipro, MD  fesoterodine (TOVIAZ) 4 MG TB24 Take 2 tablets (8 mg total) by mouth daily. 11/08/12  Yes Candelaria Celeste, MD  gabapentin (NEURONTIN) 300 MG capsule Take 1 capsule (300 mg total) by mouth 3 (three) times daily. 11/27/12  Yes Leone Haven, MD  Glucosamine-Chondroitin-Vit D3 1500-1200-800 MG-MG-UNIT PACK Take 1 tablet by mouth 2 (two) times daily.     Yes Historical Provider, MD  HYDROcodone-acetaminophen (NORCO/VICODIN) 5-325 MG per tablet Take 1 tablet by mouth every 4 (four) hours as needed for pain. 11/08/12  Yes Candelaria Celeste, MD  levothyroxine (SYNTHROID, LEVOTHROID) 112 MCG tablet take 1 tablet by mouth once daily 12/15/12  Yes Leone Haven, MD  LIDODERM 5 % APPLY 1 PATCH TO SKIN DAILY. MAY CUT PATCH AND USE 1/4 OR 1/2 OF  PATCH INSTEAD 07/08/11  Yes Lyndal Pulley, DO  mirtazapine (REMERON) 45 MG tablet Take 1 tablet (45 mg total) by mouth at bedtime. 04/06/12  Yes Leone Haven, MD  polyethylene glycol [facility]) packet Take 17 g by mouth daily. 09/23/12  Yes Rachell Cipro, MD  raloxifene (EVISTA) 60 MG tablet take 1 tablet by mouth once daily 08/07/12  Yes Leone Haven, MD  sertraline (ZOLOFT) 50 MG tablet take 1 and 1/2 tablets by mouth once daily 01/03/13  Yes Leone Haven, MD  traZODone (DESYREL) 50 MG tablet take 1/2 or 1 tablet by mouth at bedtime if needed for sleep 01/10/13  Yes  Leone Haven, MD    Inpatient Medications:    Allergies: No Known Allergies  History   Social History  . Marital Status: Widowed    Spouse Name: N/A    Number of Children: N/A  . Years of Education: N/A   Occupational History  . Not on file.   Social History Main Topics  . Smoking status: Never Smoker   . Smokeless tobacco: Never Used  . Alcohol Use: No  . Drug Use: No  . Sexual Activity: Not on file   Other Topics Concern  . Not on file   Social History Narrative   lives with son, [place]; No tob, etoh.    Has three total children. 1 g'child.;    Dtr [name] (w) [phone], (h) [phone]; dtr [place]. Daughters usually interpret for her at visits and are very involved and ask lots of questions.      Family History  Problem Relation Age of Onset  . Kidney disease Mother      Review of Systems: General: negative for chills, fever, night sweats or weight changes.  Cardiovascular: see HPI Dermatological: negative for rash Respiratory: negative for cough or wheezing Urologic: negative for hematuria, +freq urination Abdominal: negative for nausea, vomiting, diarrhea, bright red blood per rectum, melena, or hematemesis Neurologic: negative for visual changes All other systems reviewed and are otherwise negative except as noted above.  Labs: No results found for this basename: CKTOTAL, CKMB, TROPONINI,  in the last 72 hours Lab Results  Component Value Date   WBC 8.5 02/08/2013   HGB 12.3 02/08/2013   HCT 37.0 02/08/2013   MCV 99.7 02/08/2013   PLT 166 02/08/2013    Recent Labs Lab 02/08/13 2016  NA 140  K 4.5  CL 102  CO2 25  BUN 24*  CREATININE 1.12*  CALCIUM 9.0  PROT 6.7  BILITOT 0.2*  ALKPHOS 91  ALT 16  AST 32  GLUCOSE 102*   Lab Results  Component Value Date   CHOL 240* 08/28/2012   HDL 61 08/28/2012   LDLCALC 133* 08/28/2012   TRIG 230* 08/28/2012    Radiology/Studies:  Dg Chest 1 View  02/08/2013   CLINICAL DATA:  [place].  Possible altered mental status.  EXAM: CHEST - 1 VIEW  COMPARISON:  06/10/2009 and chest CT dated 04/16/2010.  FINDINGS: The cardiac silhouette remains borderline enlarged. The lungs remain mildly hyperexpanded with diffuse peribronchial thickening and accentuation of the interstitial markings. Thoracic spine degenerative changes. Superior migration of both humeral heads, greater on the right with some associated degenerative changes. No fracture or pneumothorax seen.  IMPRESSION: Stable changes of COPD and chronic bronchitis. No acute abnormality.   Electronically Signed   By: Enrique Sack M.D.   On: 02/08/2013 21:33   Dg Pelvis 1-2 Views  02/08/2013   CLINICAL DATA:  [place]. Possible altered mental status.  EXAM: PELVIS - 1-2 VIEW  COMPARISON:  10/24/2012 and CT dated 01/21/2013.  FINDINGS: No fracture or dislocation seen. Lower lumbar spine degenerative changes and scoliosis. Stable calcified enchondroma or infarct in the proximal right femur.  IMPRESSION: No fracture or dislocation.   Electronically Signed   By: Enrique Sack M.D.   On: 02/08/2013 21:34   Ct Head Wo Contrast  02/08/2013   CLINICAL DATA:  79 year old female with loss of consciousness, dizziness, headache and found on floor.  EXAM: CT HEAD WITHOUT CONTRAST  CT CERVICAL SPINE WITHOUT CONTRAST  TECHNIQUE: Multidetector CT imaging of the head and cervical spine was performed following the standard protocol without intravenous contrast. Multiplanar CT image reconstructions of the cervical spine were also generated.  COMPARISON:  07/01/2010 and prior head CTs. 04/02/2010 cervical spine MR.  FINDINGS: CT HEAD FINDINGS  Atrophy and chronic small-vessel white matter ischemic changes are again noted. A left supraclinoid internal carotid artery aneurysm appears unchanged.  No acute intracranial abnormalities are identified, including mass lesion or mass effect, hydrocephalus, extra-axial fluid collection, midline shift, hemorrhage, or acute infarction.   The visualized bony calvarium is unremarkable.  CT CERVICAL SPINE FINDINGS  5 mm degenerative anteriolisthesis at C5-C6 is unchanged.  There is no evidence of acute fracture or new subluxation.  Severe degenerative disc disease at C5-6 and C6-7 noted.  Moderate facet arthropathy throughout the cervical spine again noted.  No focal bony lesions are identified.  IMPRESSION: CT HEAD IMPRESSION:  No evidence of acute intracranial abnormality.  CT CERVICAL SPINE IMPRESSION:  No evidence of acute abnormality within the cervical spine.  Severe degenerative changes from C5-C7.   Electronically Signed   By: Hassan Rowan M.D.   On: 02/08/2013 21:53   Ct Cervical Spine Wo Contrast  02/08/2013   CLINICAL DATA:  79 year old female with loss of consciousness, dizziness, headache and found on floor.  EXAM: CT HEAD WITHOUT CONTRAST  CT CERVICAL SPINE WITHOUT CONTRAST  TECHNIQUE: Multidetector CT imaging of the head and cervical spine was performed following the standard protocol without intravenous contrast. Multiplanar CT image reconstructions of the cervical spine were also generated.  COMPARISON:  07/01/2010 and prior head CTs. 04/02/2010 cervical spine MR.  FINDINGS: CT HEAD FINDINGS  Atrophy and chronic small-vessel white matter ischemic changes are again noted. A left supraclinoid internal carotid artery aneurysm appears unchanged.  No acute intracranial abnormalities are identified, including mass lesion or mass effect, hydrocephalus, extra-axial fluid collection, midline shift, hemorrhage, or acute infarction.  The visualized bony calvarium is unremarkable.  CT CERVICAL SPINE FINDINGS  5 mm degenerative anteriolisthesis at C5-C6 is unchanged.  There is no evidence of acute fracture or new subluxation.  Severe degenerative disc disease at C5-6 and C6-7 noted.  Moderate facet arthropathy throughout the cervical spine again noted.  No focal bony lesions are identified.  IMPRESSION: CT HEAD IMPRESSION:  No evidence of acute  intracranial abnormality.  CT CERVICAL SPINE IMPRESSION:  No evidence of acute abnormality within the cervical spine.  Severe degenerative changes from C5-C7.   Electronically Signed   By: Hassan Rowan M.D.   On: 02/08/2013 21:53    EKG: sinus, 1st deg AVB, TWI in V3-V5 consistent with LVH, unchanged from prior  Physical Exam: Blood pressure 113/57, pulse 67, temperature 98.6 F (37 C), temperature source Oral, resp. rate 17, height 5' (1.524 m), weight 63.504 kg (140 lb), SpO2 96.00%. General: Well developed, well nourished, in no acute distress. Head: Normocephalic,  atraumatic, sclera non-icteric, no xanthomas, nares are without discharge.  Neck: Supple. Negative for carotid bruits. JVD not elevated. Lungs: Clear bilaterally to auscultation without wheezes, rales, or rhonchi. Breathing is unlabored. Heart: RRR with S1 S2. No murmurs, rubs, or gallops appreciated. Abdomen: Soft, non-tender, non-distended with normoactive bowel sounds. No hepatomegaly. No rebound/guarding. No obvious abdominal masses. Msk:  Strength and tone appear normal for age. Extremities: No clubbing or cyanosis. No edema.  Distal pedal pulses are 2+ and equal bilaterally. Neuro: Alert and oriented X 3. Moves all extremities spontaneously. Psych:  Responds to questions appropriately with a normal affect.   Problem List 1. Syncope 2. EKG with LVH changes 3. Chart history of hypertension but not on antihypertensives 4. Borderline troponin, single value  Assessment and Plan:   78 y.o. female w/ PMHx significant for depression, osteoporesis but no cardiac history who presented to [facility] on 02/09/2013 with complaints of syncope.  History is suggestive of arrhythmic cause given that it occurred while seated (as opposed to orthostatic that would occur with getting up from the table). Mild first deg AVB on EKG but otherwise, nothing on telemetry or 12 lead to suggest arrhythmia as the cause. Interestingly, she does  have LVH with strain-like T wave inversions but is not on any antihypertensives. A echo is warranted to investigate for valvular causes or reduced EF (arrhythmia) that would cause syncope (though noted that she has poor acoustic windows). Continue telemetry to look for arrhythmic causes of syncope.  Mildly elevated troponin though interpretation of this single value is difficult and I doubt it represents true acute coronary syndrome ischemia. Either spuriously elevated or represents mild supply/demand mismatch in the setting of hypotension. However, would continue aspirin until her situation is sorted out. No need for heparinization given minimal TIMI score.  Summary of recommendations: -maintain telemetry -cycle troponin x 3  -continue aspirin for now -echocardiogram in the AM to evaluate valves and EF  Thank you for this consult. Will follow up in the AM.  Signed, Masud Holub C. MD 02/09/2013, 1:44 AM

## 2013-02-09 NOTE — Progress Notes (Signed)
[name]      SUBJECTIVE admitted with syncope The story is quite striking. She was apparently sitting in a chair looking over her left shoulder talking to her son and collapsed to the floor. It was her daughter's impression that she not been quite well for the last couple of hours. The episode was relatively brief, apparently she was aroused within about 30 seconds. Her eyes were initially rolled back in her head according to the daughter's report from the son. There is some stiffness of one of her legs. There is no recollection of nausea diaphoresis or sense of warmth  She has a long-standing history of hypertension. Antihypertensive therapy has been discontinued over the last year or 2 because of symptomatic relatively low blood pressure. She does have orthostatic dizziness. Her blood pressure on arrival of EMS yesterday was 240/120.  She's also had other episodes of brief dizziness mostly with prolonged standing. She has had no prior syncope. She has no history of palpitations.  She has chronic back pain. She's been treated in the past with NSAIDs which had been stopped because of fracture in kidney function    Past Medical History  Diagnosis Date  . Lung mass     right apical need CT in end of 2012  . Cervicalgia   . Peripheral neuropathy   . Frailty   . Hypertension   . Hypothyroid   . Osteoporosis   . Insomnia   . Arthritis     Scheduled Meds:  Scheduled Meds: . aspirin EC  325 mg Oral Daily  . diclofenac sodium  2 g Topical QID  . docusate sodium  200 mg Oral BID  . gabapentin  300 mg Oral TID  . heparin  5,000 Units Subcutaneous Q8H  . levothyroxine  112 mcg Oral QAC breakfast  . lidocaine  1 patch Transdermal Q24H  . mirtazapine  45 mg Oral QHS  . polyethylene glycol  17 g Oral Daily  . raloxifene  60 mg Oral Daily  . sertraline  50 mg Oral Daily  . sodium chloride  3 mL Intravenous Q12H   Continuous Infusions:   PHYSICAL EXAM Filed  Vitals:   02/09/13 0200 02/09/13 0319 02/09/13 0320 02/09/13 0322  BP: 151/68 124/50 118/48 126/81  Pulse: 69 59 61 63  Temp: 98.4 F (36.9 C)     TempSrc: Oral     Resp: 18     Height:      Weight: 134 lb (60.782 kg)     SpO2: 94% 94%      General appearance: alert, cooperative, appears stated age, no distress and moderately obese Neck: no adenopathy, supple, symmetrical, trachea midline and B carotid bruits Lungs: clear to auscultation bilaterally Heart: regular rate and rhythm Abdomen: soft, non-tender; bowel sounds normal; no masses,  no organomegaly Extremities: extremities normal, atraumatic, no cyanosis or edema Pulses: 2+ and symmetric Skin: Skin color, texture, turgor normal. No rashes or lesions Neurologic: Grossly normal    TELEMETRY: Reviewed telemetry pt in sinus   Intake/Output Summary (Last 24 hours) at 02/09/13 1227 Last data filed at 02/09/13 0400  Gross per 24 hour  Intake      0 ml  Output    100 ml  Net   -100 ml    LABS: Basic Metabolic Panel:  Recent Labs Lab 02/08/13 2016 02/09/13 0620  NA 140 139  K 4.5 3.9  CL 102 101  CO2 25 30  GLUCOSE 102* 87  BUN 24* 24*  CREATININE 1.12* 1.19*  CALCIUM 9.0 8.9   Cardiac Enzymes:  Recent Labs  02/09/13 0620  TROPONINI <0.30   CBC:  Recent Labs Lab 02/08/13 2016  WBC 8.5  NEUTROABS 6.2  HGB 12.3  HCT 37.0  MCV 99.7  PLT 166   PROTIME: No results found for this basename: LABPROT, INR,  in the last 72 hours Liver Function Tests:  Recent Labs  02/08/13 2016  AST 32  ALT 16  ALKPHOS 91  BILITOT 0.2*  PROT 6.7  ALBUMIN 3.0*   No results found for this basename: LIPASE, AMYLASE,  in the last 72 hours BNP: BNP (last 3 results) No results found for this basename: PROBNP,  in the last 8760 hours D-Dimer: No results found for this basename: DDIMER,  in the last 72 hours Hemoglobin A1C: No results found for this basename: HGBA1C,  in the last 72 hours   ASSESSMENT AND  PLAN:  Principal Problem:   Syncope and collapse Active Problems:   HYPOTHYROIDISM, UNSPECIFIED   HYPERTENSION, BENIGN SYSTEMIC   Cervicalgia   OSTEOPOROSIS   Incomplete bladder emptying  She had syncope that was abrupt in onset with some type of prodrome That had persisted for some time. However, the event itself was extremely sudden and quite brief supportive of arrhythmic mechanism. With her head turned to the left at that time , it makes the possibility of carotid sinus hypersensitivity. However, bilateral bruits preclude carotid sinus massage at this time. We'll check carotid Dopplers  Vasomotor instability especially with her long-standing hypertensive vascular disease is still the most likely mechanism. With the fact that her blood pressure today is 98 systolic and her UA is abnormal it raises the possibility of some degree of infection. We will check her urine culture.  In the event that nothing is clarifying, given the recurrent presyncopal episodes and this episode I have suggested to the family that implantable loop recorder would be reasonable    Signed, Virl Axe MD  02/09/2013

## 2013-02-10 DIAGNOSIS — I10 Essential (primary) hypertension: Secondary | ICD-10-CM | POA: Diagnosis not present

## 2013-02-10 DIAGNOSIS — R55 Syncope and collapse: Secondary | ICD-10-CM | POA: Diagnosis not present

## 2013-02-10 DIAGNOSIS — M19019 Primary osteoarthritis, unspecified shoulder: Secondary | ICD-10-CM | POA: Diagnosis not present

## 2013-02-10 DIAGNOSIS — I517 Cardiomegaly: Secondary | ICD-10-CM

## 2013-02-10 DIAGNOSIS — E039 Hypothyroidism, unspecified: Secondary | ICD-10-CM | POA: Diagnosis not present

## 2013-02-10 MED ORDER — LIDOCAINE 5 % EX PTCH: 1.0000 | MEDICATED_PATCH | CUTANEOUS | Status: DC

## 2013-02-10 MED ORDER — HYDRALAZINE HCL 10 MG PO TABS
10.0000 mg | ORAL_TABLET | Freq: Four times a day (QID) | ORAL | Status: DC | PRN
  Filled 2013-02-10: qty 1

## 2013-02-10 NOTE — Progress Notes (Signed)
/  skr       [name]      SUBJECTIVE  No recurrent symptoms orhtostatics are unimpressive \  Past Medical History  Diagnosis Date  . Lung mass     right apical need CT in end of 2012  . Cervicalgia   . Peripheral neuropathy   . Frailty   . Hypertension   . Hypothyroid   . Osteoporosis   . Insomnia   . Arthritis     Scheduled Meds:  Scheduled Meds: . aspirin EC  325 mg Oral Daily  . diclofenac sodium  2 g Topical QID  . docusate sodium  200 mg Oral BID  . gabapentin  300 mg Oral TID  . heparin  5,000 Units Subcutaneous Q8H  . influenza vac split quadrivalent PF  0.5 mL Intramuscular Tomorrow-1000  . levothyroxine  112 mcg Oral QAC breakfast  . lidocaine  1 patch Transdermal Q24H  . mirtazapine  45 mg Oral QHS  . polyethylene glycol  17 g Oral Daily  . raloxifene  60 mg Oral Daily  . sertraline  50 mg Oral Daily  . sodium chloride  3 mL Intravenous Q12H   Continuous Infusions:   PHYSICAL EXAM Filed Vitals:   02/09/13 1558 02/09/13 1929 02/10/13 0502 02/10/13 0848  BP: 120/50 152/47 118/46 164/69  Pulse: 61 72 57   Temp:  98.1 F (36.7 C) 97.7 F (36.5 C)   TempSrc:  Oral Oral   Resp:  18 18   Height:      Weight:      SpO2:  92% 94%    Well developed and nourished in no acute distress HENT normal Neck supple  Clear Regular rate and rhythm, 2/6 murmur Abd-soft with active BS No Clubbing cyanosis edema Skin-warm and dry A & Oriented  Grossly normal sensory and motor function  TELEMETRY: Reviewed telemetry pt in NSR     Intake/Output Summary (Last 24 hours) at 02/10/13 1004 Last data filed at 02/09/13 2200  Gross per 24 hour  Intake    240 ml  Output    200 ml  Net     40 ml    LABS: Basic Metabolic Panel:  Recent Labs Lab 02/08/13 2016 02/09/13 0620  NA 140 139  K 4.5 3.9  CL 102 101  CO2 25 30  GLUCOSE 102* 87  BUN 24* 24*  CREATININE 1.12* 1.19*  CALCIUM 9.0 8.9   Cardiac Enzymes:  Recent Labs   02/09/13 0620 02/09/13 1216 02/09/13 1800  TROPONINI <0.30 <0.30 <0.30   CBC:  Recent Labs Lab 02/08/13 2016  WBC 8.5  NEUTROABS 6.2  HGB 12.3  HCT 37.0  MCV 99.7  PLT 166   PROTIME: No results found for this basename: LABPROT, INR,  in the last 72 hours Liver Function Tests:  Recent Labs  02/08/13 2016  AST 32  ALT 16  ALKPHOS 91  BILITOT 0.2*  PROT 6.7  ALBUMIN 3.0*     Recent Labs  02/09/13 0620  TSH 1.472     ASSESSMENT AND PLAN:  Principal Problem:   Syncope and collapse Active Problems:   HYPOTHYROIDISM, UNSPECIFIED   HYPERTENSION, BENIGN SYSTEMIC   Cervicalgia   OSTEOPOROSIS   Incomplete bladder emptying  Plan carotid dopplers and possible CSM depending on results Echo pending And LINQ tomorrow  Signed, Virl Axe MD  02/10/2013

## 2013-02-10 NOTE — Progress Notes (Signed)
Echocardiogram 2D Echocardiogram has been performed.  Jordan [name] 02/10/2013, 9:23 AM

## 2013-02-10 NOTE — Progress Notes (Signed)
FMTS Attending Note  I personally saw and evaluated the patient. The plan of care was discussed with the resident team. I agree with the assessment and plan as documented by the resident.   Appreciate cardiology recommendations on this patient.   Dossie Arbour MD

## 2013-02-10 NOTE — Progress Notes (Signed)
Family Medicine Teaching Service Daily Progress Note Intern Pager: [phone]  Patient name: YANESSA THREATS Medical record number: [id number] Date of birth: 1919-08-16 Age: 79 y.o. Gender: female  Primary Care Provider: [name], MD Consultants: cardiology Code Status: FULL, clarified with family  Pt Overview and Major Events to Date:  10/10 Admitted and cardiology consulted, recommending echo 10/11 Pt stable, pending studies (echo, carotid dopplers)  Assessment and Plan: MELYNN KNEALE is a 79 y.o. female presenting with syncope. PMH is significant for HTN, hypothyroidism, osteoporosis, depression, lumbar stenosis, and rotator cuff tear.  # Syncope - concern for cardiogenic per hx, arrhythmia more likely than ischemia, given sudden loss of consciousness without prodrome, convulsions, or neurologic sequelae, nl BG and Hgb, no chest pain or SOB. POC troponin mildly elevated with f/u's neg x 3 - EKG in ED stable from prior with TWI in lateral leads - Previous EKG with 1st deg AV block and brady - Vitals NOT orthostatic - Echo 2010 with EF 60-70% without evidence of diastolic dysfunction and no valvular abnormalities - Cardiology consulted, recommending Aspirin without heparin, echocardiogram, and carotid dopplers - ordered and pending - F/u telemetry - Holding tiovaz due to anticholinergic effect [ ]  F/u echocardiogram and carotid doppler [ ]  Per cards recs, consider implantable loop recorder after carotid doppler and d/c, likely with cardiology f/u [ ]  F/u urine culture to see if any infectious etiology with mildly low BP yesterday [ ]  F/u OT recs. PT recommends outpatient PT  # Hypothyroidism - Continued home synthroid. TSH WN at 1.472  # CKD III - GFR and Cr at baseline around 1.12 with GFR <60 on admission - Cr 10/11 1.19 - F/u BMET tomorrow, lab holiday today - POing well  # HTN not on medication - intermittently hyertensive here to [id number] systolic - Monitor VS - Cr at  baseline - Defer consideration of starting antiHTN to outpatient management by PCP - While here, hydralazine 10mg  PO Q4hrPRN SBP>180 or DBP>110  # Rotator cuff tear - Has twice weekly PT. Complete tear right supraspinatus and infraspinatus on 10/2012 MRI - Continue home vicodin prn, voltaren, lidoderm patch - Right shoulder x-ray 10/11 with no acute findings s/p fall at home  # Depression/insomnia - continuing home medications # Spinal stenosis - Continue above home meds and gabapentin # Osteoporosis - Holding fosamax  FEN/GI: SLIV, low salt diet PPX: Subcutaneous heparin, Incentive Spirometry  Disposition: Inpatient, pending cardiac workup - PT recommends outpatient PT - F/u OT recs  Subjective: Patient reports right shoulder pain but no other complaints, no CP, SOB, dizziness, or syncope. Interviewed in [place]. Sitting up in chair eating breakfast.  Objective: Temp:  [97.7 F (36.5 C)-98.2 F (36.8 C)] 97.7 F (36.5 C) (10/12 0502) Pulse Rate:  [57-72] 57 (10/12 0502) Resp:  [18] 18 (10/12 0502) BP: (98-164)/(43-87) 164/69 mmHg (10/12 0848) SpO2:  [92 %-95 %] 94 % (10/12 0502) Physical Exam: General: NAD, pleasant, Spanish-speaking Cardiovascular: RRR though heart sounds distant, no murmurs, rubs, or gallops auscultated though limited by distant heart sounds; Left-sided carotid bruit Respiratory: CTAB with no wheezes or crackles, normal effort Abdomen: Soft, nontender, nondistended, NABS Extremities: No LE edema, able to move extremities NEURO: Awake, alert, no gross deficits, normal speech, normal tone to sit up. EOMI.  Laboratory:  Recent Labs Lab 02/08/13 2016  WBC 8.5  HGB 12.3  HCT 37.0  PLT 166    Recent Labs Lab 02/08/13 2016 02/09/13 0620  NA 140 139  K 4.5 3.9  CL 102 101  CO2 25 30  BUN 24* 24*  CREATININE 1.12* 1.19*  CALCIUM 9.0 8.9  PROT 6.7  --   BILITOT 0.2*  --   ALKPHOS 91  --   ALT 16  --   AST 32  --   GLUCOSE 102* 87       Recent Labs Lab 02/09/13 0620 02/09/13 1216 02/09/13 1800  TROPONINI <0.30 <0.30 <0.30     Hilton Sinclair, MD 02/10/2013, 9:20 AM PGY-2, [place] Intern pager: [phone], text pages welcome

## 2013-02-10 NOTE — Evaluation (Addendum)
Physical Therapy Evaluation Patient Details Name: Jordan [name] MRN: [id number] DOB: 15-May-1919 Today's Date: 02/10/2013 Time: [id number] PT Time Calculation (min): 19 min  PT Assessment / Plan / Recommendation History of Present Illness  Pt adm with syncope.  Clinical Impression  Pt very close to her baseline with mobility.  Fearful due to syncopal event.  Can return home with family from PT standpoint.    PT Assessment  Patient needs continued PT services    Follow Up Recommendations  Outpatient PT (resume - pt already active with [facility])    Does the patient have the potential to tolerate intense rehabilitation      Barriers to Discharge        Equipment Recommendations  None recommended by PT    Recommendations for Other Services     Frequency Min 3X/week    Precautions / Restrictions Precautions Precautions: Fall Restrictions Weight Bearing Restrictions: No   Pertinent Vitals/Pain See flow sheet      Mobility  Bed Mobility Bed Mobility: Supine to Sit;Sitting - Scoot to Edge of Bed Supine to Sit: 4: Min assist Sitting - Scoot to [employer] of Bed: 5: Supervision Details for Bed Mobility Assistance: Son assisted pt up (pt may have been able to come up unassisted). Transfers Transfers: Stand Pivot Transfers Sit to Stand: 4: Min guard;With upper extremity assist;With armrests;From bed;From chair/3-in-1 Stand to Sit: 4: Min guard;With upper extremity assist;With armrests;To chair/3-in-1 Stand Pivot Transfers: 4: Min assist Details for Transfer Assistance: Used hand held assist for balance for stand pivot Ambulation/Gait Ambulation/Gait Assistance: 4: Min guard Ambulation Distance (Feet): 125 Feet Assistive device: Rolling walker Ambulation/Gait Assistance Details: verbal cues to stay closer to walker Gait Pattern: Step-through pattern;Decreased stride length Gait velocity: decr    Exercises     PT Diagnosis: Difficulty walking  PT Problem List:  Decreased balance;Decreased mobility PT Treatment Interventions: DME instruction;Gait training;Patient/family education;Functional mobility training;Therapeutic activities;Balance training     PT Goals(Current goals can be found in the care plan section) Acute Rehab PT Goals Patient Stated Goal: return home PT Goal Formulation: With patient Time For Goal Achievement: 02/17/13 Potential to Achieve Goals: Good  Visit Information  Last PT Received On: 02/10/13 Assistance Needed: +1 History of Present Illness: Pt adm with syncope.       Prior [place] expects to be discharged to:: Private residence Living Arrangements: Children Available Help at Discharge: Family;Available PRN/intermittently Type of Home: Apartment Home Access: Level entry Home Layout: One level Home Equipment: Walker - 4 wheels;Cane - single point Additional Comments: pt's son works in the mornings, but pt's daughters are able to come by when son is gone.  pt has family available most of the time, but not 24hrs.   Prior Function Level of Independence: Needs assistance Gait / Transfers Assistance Needed: Uses her 4WW or cane for Mod I in home, A for ambulating in community.   ADL's / Homemaking Assistance Needed: Son performs all homemaking and preps all meals with pt only having to pour a drink or make toast.  pt's daughters A with bathing pt at sink and then pt dresses herself.   Communication Communication: Prefers language other than English;Other (comment) (son translating)    Cognition  Cognition Arousal/Alertness: Awake/alert Behavior During Therapy: WFL for tasks assessed/performed Overall Cognitive Status: Within Functional Limits for tasks assessed    Extremity/Trunk Assessment Upper Extremity Assessment Upper Extremity Assessment: Defer to OT evaluation Lower Extremity Assessment Lower Extremity Assessment: Overall  WFL for tasks assessed   Balance Balance Balance  Assessed: Yes Static Standing Balance Static Standing - Balance Support: Bilateral upper extremity supported Static Standing - Level of Assistance: 5: Stand by assistance  End of Session PT - End of Session Equipment Utilized During Treatment: Gait belt Activity Tolerance: Patient tolerated treatment well Patient left: in chair;with call bell/phone within reach;with family/visitor present Nurse Communication: Mobility status  GP     Clever Geraldo 02/10/2013, 9:13 AM  [place]

## 2013-02-11 ENCOUNTER — Encounter (HOSPITAL_COMMUNITY)
Admission: EM | Disposition: A | Discharge status: Home Health | Payer: Self-pay | Source: Home / Self Care | Attending: Family Medicine

## 2013-02-11 ENCOUNTER — Telehealth: Payer: Self-pay | Admitting: Clinical

## 2013-02-11 DIAGNOSIS — R55 Syncope and collapse: Secondary | ICD-10-CM | POA: Diagnosis not present

## 2013-02-11 HISTORY — PX: LOOP RECORDER IMPLANT: SHX5477

## 2013-02-11 LAB — BASIC METABOLIC PANEL
CO2: 30 mEq/L (ref 19–32)
Calcium: 8.4 mg/dL (ref 8.4–10.5)
Chloride: 101 mEq/L (ref 96–112)
GFR calc non Af Amer: 44 mL/min — ABNORMAL LOW (ref >90)
Glucose, Bld: 94 mg/dL (ref 70–99)
Potassium: 4.3 mEq/L (ref 3.5–5.1)
Sodium: 139 mEq/L (ref 135–145)

## 2013-02-11 LAB — URINE CULTURE

## 2013-02-11 LAB — CBC
HCT: 35.7 % — ABNORMAL LOW (ref 36.0–46.0)
Hemoglobin: 11.8 g/dL — ABNORMAL LOW (ref 12.0–15.0)
MCH: 33.3 pg (ref 26.0–34.0)
Platelets: 169 10*3/uL (ref 150–400)
RBC: 3.54 MIL/uL — ABNORMAL LOW (ref 3.87–5.11)
WBC: 7.9 10*3/uL (ref 4.0–10.5)

## 2013-02-11 SURGERY — LOOP RECORDER IMPLANT
Anesthesia: LOCAL

## 2013-02-11 MED ORDER — LIDOCAINE HCL (PF) 1 % IJ SOLN
INTRAMUSCULAR | Status: AC
  Filled 2013-02-11: qty 30

## 2013-02-11 MED ORDER — LIDOCAINE 5 % EX PTCH
2.0000 | MEDICATED_PATCH | CUTANEOUS | Status: DC
  Administered 2013-02-11: 1 via TRANSDERMAL
  Filled 2013-02-11: qty 2

## 2013-02-11 MED ORDER — ACETAMINOPHEN 325 MG PO TABS: 325.0000 mg | ORAL_TABLET | ORAL | Status: DC | PRN

## 2013-02-11 MED ORDER — ONDANSETRON HCL 4 MG/2ML IJ SOLN: 4.0000 mg | Freq: Four times a day (QID) | INTRAMUSCULAR | Status: DC | PRN

## 2013-02-11 MED ORDER — ASPIRIN 325 MG PO TBEC: 325.0000 mg | DELAYED_RELEASE_TABLET | Freq: Every day | ORAL | Status: DC

## 2013-02-11 MED ORDER — LIDOCAINE 5 % EX PTCH: 2.0000 | MEDICATED_PATCH | CUTANEOUS | Status: DC

## 2013-02-11 NOTE — Progress Notes (Signed)
PT Cancellation Note  Patient Details Name: LILYANI LABRA MRN: [id number] DOB: 02-03-1920   Cancelled Treatment:    Reason Eval/Treat Not Completed: Fatigue/lethargy limiting ability to participate;Patient at procedure or test/unavailable. Pt sleeping upon entering room and pt's daughter meeting with case manager, pt's daughter requested PT check back later as pt fatigued and scheduled for procedure at 1400.   Audie Clear 02/11/2013, 2:10 PM

## 2013-02-11 NOTE — Progress Notes (Signed)
02/11/2013 1415 nursing note  Consent for LINQ insertion obtained per orders using Interpreter [id number]. Pt. Voiced understanding of procedure as explained by MD.  Stormy Fabian, Arville Lime

## 2013-02-11 NOTE — Progress Notes (Signed)
EP  Patient underwent carotid sinus massage which demonstrated no bradycardia.  Mikle Bosworth.D.

## 2013-02-11 NOTE — Progress Notes (Signed)
I have reviewed this note and agree with all findings. Kati Arya Boxley, PT, DPT Pager: [phone]   

## 2013-02-11 NOTE — CV Procedure (Signed)
EP Procedure Note  Procedure: Insertion of a [employer]onic ILR (LINQ)  Indication: Unexplained syncope  Findings: after informed consent obtained, patient taken to the diagnostic EP lab in the fasting state. After the usual preparation, 30 cc of lidocaine was infiltrated into the left pectoral region. A one centimeter stab incision was made and the ILR was inserted in the usual manner without difficulty. Benzoin and steri-strips were placed. The voltage was measured and was 0.8 volts (very good) and benzoin and steri-strips and a bandage were placed over the incision. The patient was returned to her room in satisfactory condition.   Complications: none immediately  Conclusion: successful insertion of an ILR in a patient with unexplained syncope. [place] for early discharge.  Mikle Bosworth.D.

## 2013-02-11 NOTE — Progress Notes (Signed)
Family Medicine Teaching Service Attending Note  I interviewed and examined patient Jordan [name] and reviewed their tests and x-rays.  I discussed with Dr. Bonner Puna and reviewed their note for today.  I agree with their assessment and plan.     Additionally  Feels well this AM No evident reversible cause of syncope Proceed as per cardiology with loop recorder

## 2013-02-11 NOTE — Progress Notes (Signed)
Pt/family given discharge instructions, medication lists, follow up appointments, and when to call the doctor.  Pt/family verbalizes understanding. PT's daughter concerned with the interpreter service via telephone.  Stated some statements were translated incorrectly.  Had no problem with RN's but questioned protocols in place to check competency of interpreters.  Daughter concerned that primary MD had not came to discuss pt and discharge.  I called Family Practice and MD answered all questions.  Family verbalized understanding and will follow up with call back survey. Jordan [name]

## 2013-02-11 NOTE — Progress Notes (Signed)
VASCULAR LAB PRELIMINARY  PRELIMINARY  PRELIMINARY  PRELIMINARY  Carotid Dopplers completed.    Preliminary report:  1-395 ICA stenosis.  Vertebral artery flow is antegrade.  Arsema Tusing, RVT 02/11/2013, 9:09 AM

## 2013-02-11 NOTE — Interval H&P Note (Signed)
History and Physical Interval Note: Patient seen and examined. Agree with Dr. Olin Pia note. She has negative dopplers of the carotid with no stenosis and CSM demonstrated no bradycardia. She now presents for insertion of an ILR.   02/11/2013 2:12 PM  Jordan [name]  has presented today for surgery, with the diagnosis of cryptogenic stroke  The various methods of treatment have been discussed with the patient and family. After consideration of risks, benefits and other options for treatment, the patient has consented to  Procedure(s): LOOP RECORDER IMPLANT (N/A) as a surgical intervention .  The patient's history has been reviewed, patient examined, no change in status, stable for surgery.  I have reviewed the patient's chart and labs.  Questions were answered to the patient's satisfaction.     Mikle Bosworth.D.

## 2013-02-11 NOTE — Progress Notes (Signed)
Family Medicine Teaching Service Daily Progress Note Intern Pager: [phone]  [name] Medical record number: [id number] Date of birth: [birth year] Age: 79 y.o. Gender: female  Primary Care Provider: [name], MD Consultants: cardiology Code Status: FULL, clarified with family  Pt Overview and Major Events to Date:  10/10 Admitted and cardiology consulted, recommending echo 10/11 Pt stable, pending studies (echo, carotid dopplers) 2023-02-25 Echo read, dopplers done, pending cardiology dispo  Assessment and Plan: Jordan [name] is a 79 y.o. female presenting with syncope. PMH is significant for HTN, hypothyroidism, osteoporosis, depression, lumbar stenosis, and rotator cuff tear.  # Syncope - concern for cardiogenic per hx, arrhythmia more likely than ischemia, given sudden loss of consciousness without prodrome, convulsions, or neurologic sequelae, nl BG and Hgb, no chest pain or SOB. POC troponin mildly elevated with f/u's neg x 3 - EKG in ED stable from prior with TWI in lateral leads - Telemetry and previous EKG with 1st deg AV block and brady - Vitals NOT orthostatic, BPs normal - Holding tiovaz due to anticholinergic effect - Cardiology consulted, recommending Aspirin without heparin, possible implantable loop recorder - Carotid doppler preliminary read 1-39% stenosis  - Echo: 60-65% EF, Grade I diastolic dysfunction, calcified aortic valve with trivial regurg.  - Urine Cx in process [ ]  F/u OT recs. PT recommends outpatient PT  # Hypothyroidism - Continued home synthroid. TSH WN at 1.472  # CKD III - GFR and Cr at baseline around 1.12 with GFR <60 on admission - Cr 25-Feb-2023 1.07 - POing well  # HTN not on medication - intermittently hyertensive here to [id number] systolic - Monitor VS - Cr at baseline - Defer consideration of starting antiHTN to outpatient management by PCP - While here, hydralazine 10mg  PO Q4hrPRN SBP>180 or DBP>110  # Rotator cuff tear - Has twice  weekly PT. Complete tear right supraspinatus and infraspinatus on 10/2012 MRI - Continue home vicodin prn, voltaren, lidoderm patch - Right shoulder x-ray 10/11 with no acute findings s/p fall at home  # Depression/insomnia - continuing home medications # Spinal stenosis - Continue above home meds and gabapentin # Osteoporosis - Holding fosamax  FEN/GI: SLIV, low salt diet PPX: Subcutaneous heparin, Incentive Spirometry  Disposition: Inpatient, pending cardiac workup - PT recommends outpatient PT - F/u OT recs and cardiology recommendations Re: implanted loop recorder  Subjective: Patient reports right shoulder pain but no other complaints, no CP, SOB, dizziness, or syncope. Slept well last night, son at bedside. Family interested in home health aid.   Objective: Temp:  [97.7 F (36.5 C)-98.2 F (36.8 C)] 97.7 F (36.5 C) (10/13 0553) Pulse Rate:  [53-68] 59 (10/13 0730) Resp:  [18] 18 (10/13 0730) BP: (126-141)/(62-79) 128/62 mmHg (10/13 0730) SpO2:  [92 %-96 %] 92 % (10/13 0730) Physical Exam: General: NAD, pleasant, spanish-speaking Cardiovascular: RRR though heart sounds distant, no murmurs, rubs, or gallops auscultated though limited by distant heart sounds; Left-sided carotid bruit Respiratory: CTAB with no wheezes or crackles, normal effort Abdomen: Soft, nontender, nondistended, NABS Extremities: No LE edema, able to move extremities NEURO: Awake, alert, no gross deficits, normal speech, normal tone to sit up. EOMI.  Laboratory:  Recent Labs Lab 02/08/13 2016 02/11/13 0430  WBC 8.5 7.9  HGB 12.3 11.8*  HCT 37.0 35.7*  PLT 166 169    Recent Labs Lab 02/08/13 2016 02/09/13 0620 02/11/13 0430  NA 140 139 139  K 4.5 3.9 4.3  CL 102 101 101  CO2 25  30 30  BUN 24* 24* 24*  CREATININE 1.12* 1.19* 1.07  CALCIUM 9.0 8.9 8.4  PROT 6.7  --   --   BILITOT 0.2*  --   --   ALKPHOS 91  --   --   ALT 16  --   --   AST 32  --   --   GLUCOSE 102* 87 94       Recent Labs Lab 02/09/13 0620 02/09/13 1216 02/09/13 1800  TROPONINI <0.30 <0.30 <0.30   ECHO - Left ventricle: The cavity size was normal. There was severe concentric hypertrophy. Systolic function was normal. The estimated ejection fraction was in the range of 60% to 65%. Doppler parameters are consistent with abnormal left ventricular relaxation (grade 1 diastolic dysfunction). - Aortic valve: Mildly calcified annulus. Trileaflet. Trivial regurgitation. - Left atrium: The atrium was mildly dilated. - Atrial septum: No defect or patent foramen ovale was identified.   Jordan Gather, MD 02/11/2013, 9:23 AM PGY-2, [place] Intern pager: [phone], text pages welcome

## 2013-02-11 NOTE — Telephone Encounter (Signed)
Form given to son this morning.

## 2013-02-11 NOTE — Telephone Encounter (Signed)
Outpatient [facility] CSW received a call from pt daughter inquiring about [facility]). CSW was informed that the pt is currently in the hospital and will be dc'ing with [facility] services possibly today. CSW informed daughter that CSW and PCP would complete the PCS form and have it faxed to [facility] this week. Daughter aware that the process cant take some time however daughter was very Patent attorney.  CSW and Dr. Dianah Field have completed the [facility] form and CSW has faxed it to [facility] for review.   Hunt Oris, MSW, [place]

## 2013-02-11 NOTE — Progress Notes (Signed)
OT Cancellation Note and Discharge  Patient Details Name: Jordan [name] MRN: [id number] DOB: [birth year]   Cancelled Treatment:    Reason Eval/Treat Not Completed: OT screened, no needs identified, will sign off. Daughter in room reports since pt has back and arm issues (rotator cuff tear) she does not see her mother trying to do her BADLs by herself, but rather she (daughter) would like to talk with someone that could A her with getting someone in the home to help her mother with these tasks. [place] made aware and in to talk with daughter.Pt has all needed DME (walker, BSC, tub bench) No acute OT needs identified, will sign off.  Almon Register [id number] 02/11/2013, 2:02 PM

## 2013-02-11 NOTE — H&P (View-Only) (Signed)
/  skr       [name]      SUBJECTIVE  No recurrent symptoms orhtostatics are unimpressive \  Past Medical History  Diagnosis Date  . Lung mass     right apical need CT in end of 2012  . Cervicalgia   . Peripheral neuropathy   . Frailty   . Hypertension   . Hypothyroid   . Osteoporosis   . Insomnia   . Arthritis     Scheduled Meds:  Scheduled Meds: . aspirin EC  325 mg Oral Daily  . diclofenac sodium  2 g Topical QID  . docusate sodium  200 mg Oral BID  . gabapentin  300 mg Oral TID  . heparin  5,000 Units Subcutaneous Q8H  . influenza vac split quadrivalent PF  0.5 mL Intramuscular Tomorrow-1000  . levothyroxine  112 mcg Oral QAC breakfast  . lidocaine  1 patch Transdermal Q24H  . mirtazapine  45 mg Oral QHS  . polyethylene glycol  17 g Oral Daily  . raloxifene  60 mg Oral Daily  . sertraline  50 mg Oral Daily  . sodium chloride  3 mL Intravenous Q12H   Continuous Infusions:   PHYSICAL EXAM Filed Vitals:   02/09/13 1558 02/09/13 1929 02/10/13 0502 02/10/13 0848  BP: 120/50 152/47 118/46 164/69  Pulse: 61 72 57   Temp:  98.1 F (36.7 C) 97.7 F (36.5 C)   TempSrc:  Oral Oral   Resp:  18 18   Height:      Weight:      SpO2:  92% 94%    Well developed and nourished in no acute distress HENT normal Neck supple  Clear Regular rate and rhythm, 2/6 murmur Abd-soft with active BS No Clubbing cyanosis edema Skin-warm and dry A & Oriented  Grossly normal sensory and motor function  TELEMETRY: Reviewed telemetry pt in NSR     Intake/Output Summary (Last 24 hours) at 02/10/13 1004 Last data filed at 02/09/13 2200  Gross per 24 hour  Intake    240 ml  Output    200 ml  Net     40 ml    LABS: Basic Metabolic Panel:  Recent Labs Lab 02/08/13 2016 02/09/13 0620  NA 140 139  K 4.5 3.9  CL 102 101  CO2 25 30  GLUCOSE 102* 87  BUN 24* 24*  CREATININE 1.12* 1.19*  CALCIUM 9.0 8.9   Cardiac Enzymes:  Recent Labs   02/09/13 0620 02/09/13 1216 02/09/13 1800  TROPONINI <0.30 <0.30 <0.30   CBC:  Recent Labs Lab 02/08/13 2016  WBC 8.5  NEUTROABS 6.2  HGB 12.3  HCT 37.0  MCV 99.7  PLT 166   PROTIME: No results found for this basename: LABPROT, INR,  in the last 72 hours Liver Function Tests:  Recent Labs  02/08/13 2016  AST 32  ALT 16  ALKPHOS 91  BILITOT 0.2*  PROT 6.7  ALBUMIN 3.0*     Recent Labs  02/09/13 0620  TSH 1.472     ASSESSMENT AND PLAN:  Principal Problem:   Syncope and collapse Active Problems:   HYPOTHYROIDISM, UNSPECIFIED   HYPERTENSION, BENIGN SYSTEMIC   Cervicalgia   OSTEOPOROSIS   Incomplete bladder emptying  Plan carotid dopplers and possible CSM depending on results Echo pending And LINQ tomorrow  Signed, Virl Axe MD  02/10/2013

## 2013-02-11 NOTE — Discharge Summary (Signed)
[facility] Discharge Summary  [name] Medical record number: [id number] Date of birth: [birth year] Age: 79 y.o. Gender: female Date of Admission: 02/08/2013  Date of Discharge: 02/11/2013 Admitting Physician: Lupita Dawn, MD  Primary Care Provider: [name], MD Consultants: Cardiology  Indication for Hospitalization: Syncope and collapse  Discharge Diagnoses/Problem List:  Syncope and collapse Rotator cuff tear (right) Spinal stenosis Hypertension Osteoporosis Hypothyroidism Incomplete bladder emptying  Disposition: Discharged home with home health RN/PT  Discharge Condition: Stable  Discharge Exam:  VSS General: pleasant 79 yo hispanic female in NAD Cardiovascular: RRR though heart sounds distant, no murmurs, rubs, or gallops auscultated though limited by distant heart sounds; Left-sided carotid bruit  Respiratory: CTAB with no wheezes or crackles, normal effort  Abdomen: Soft, nontender, nondistended, NABS  Extremities: No LE edema, able to move extremities. R shoulder tender with preserved strength.  NEURO: Awake, alert, no gross deficits, normal speech, normal tone to sit up. EOMI.  Brief Hospital Course:  Jordan [name] is a 79 y.o. female presenting with syncope. PMH is significant for HTN, hypothyroidism, osteoporosis, depression, lumbar stenosis, and rotator cuff tear.   She was admitted after an episode of spontaneous loss of consciousness and loss of postural tone which lasted for < 30sec and resulted in a fall out of a chair onto carpet onto her right side. Imaging was negative for acute injury. Cardiac work up included negative troponin enzymes x3, no telemetric events, negative orthostatic vital signs, an unremarkable carotid doppler study, and an echocardiogram that showed grade I diastolic dysfunction, preserved EF, and a calcified aortic valve with trivial regurgitation. Carotid sinus massage did not induce  bradycardia. An ECG showed 1st degree AV block and cardiology consultant implanted a loop recorder to monitor for arrhythmias. She was discharged on 10/13 in stable condition after the procedure. She exhibited no further syncopal symptoms.  Physical therapy recommended outpatient physical therapy, which the patient was already receiving twice weekly for a right rotator cuff tear. Home health RN and PT were arranged by case management.    Issues for Follow Up:  - When patient can resume tiovaz - See if pt is getting home health RN/PT - Case Management was setting this up; can resume outpatient PT once cardiology reads loop recorder - F/u if patient able to get a sitter per family request - CM/SW looked into [facility] [phone]), and pt was asked to try calling [phone] for [facility] program - process takes time  - Consider starting very low-dose antihypertensive based on how pt's BPs are in clinic  Significant Procedures: Insertion of [employer] implanted loop recorder  Significant Labs and Imaging:   Recent Labs Lab 02/08/13 2016 02/11/13 0430  WBC 8.5 7.9  HGB 12.3 11.8*  HCT 37.0 35.7*  PLT 166 169    Recent Labs Lab 02/08/13 2016 02/09/13 0620 02/11/13 0430  NA 140 139 139  K 4.5 3.9 4.3  CL 102 101 101  CO2 25 30 30   GLUCOSE 102* 87 94  BUN 24* 24* 24*  CREATININE 1.12* 1.19* 1.07  CALCIUM 9.0 8.9 8.4  ALKPHOS 91  --   --   AST 32  --   --   ALT 16  --   --   ALBUMIN 3.0*  --   --    Lab  02/09/13 0620  02/09/13 1216  02/09/13 1800   TROPONINI  <0.30  <0.30  <0.30    Carotid doppler Bilateral: minimal  soft plaque noted. 1-39% ICA stenosis. Vertebral artery flow is antegrade. ICA/CCA ratio: R-1.23 L-1.18  RIGHT SHOULDER XR FINDINGS:  Study is limited. No convincing fracture and no dislocation. There  are moderate AC joint osteoarthritic changes. The bones are  diffusely demineralized. The underlying ribs are intact.  IMPRESSION:  No fracture or  dislocation.  PELVIS XR FINDINGS:  No fracture or dislocation seen. Lower lumbar spine degenerative  changes and scoliosis. Stable calcified enchondroma or infarct in  the proximal right femur.  IMPRESSION:  No fracture or dislocation.  CHEST XR FINDINGS:  The cardiac silhouette remains borderline enlarged. The lungs remain  mildly hyperexpanded with diffuse peribronchial thickening and  accentuation of the interstitial markings. Thoracic spine  degenerative changes. Superior migration of both humeral heads,  greater on the right with some associated degenerative changes. No  fracture or pneumothorax seen.  IMPRESSION:  Stable changes of COPD and chronic bronchitis. No acute abnormality.  CT HEAD CT HEAD FINDINGS  Atrophy and chronic small-vessel white matter ischemic changes are  again noted. A left supraclinoid internal carotid artery aneurysm  appears unchanged.  No acute intracranial abnormalities are identified, including mass  lesion or mass effect, hydrocephalus, extra-axial fluid collection,  midline shift, hemorrhage, or acute infarction.  The visualized bony calvarium is unremarkable.   CT CERVICAL SPINE CT CERVICAL SPINE FINDINGS  5 mm degenerative anteriolisthesis at C5-C6 is unchanged.  There is no evidence of acute fracture or new subluxation.  Severe degenerative disc disease at C5-6 and C6-7 noted.  Moderate facet arthropathy throughout the cervical spine again  noted.  No focal bony lesions are identified.  URINE CULTURE 70k colonies; Multiple bacterial morphotypes present, none predominant. Suggest appropriate recollection if clinically indicated.  ECHO - Left ventricle: The cavity size was normal. There was severe concentric hypertrophy. Systolic function was normal. The estimated ejection fraction was in the range of 60% to 65%. Doppler parameters are consistent with abnormal left ventricular relaxation (grade 1 diastolic dysfunction). - Aortic  valve: Mildly calcified annulus. Trileaflet. Trivial regurgitation. - Left atrium: The atrium was mildly dilated. - Atrial septum: No defect or patent foramen ovale was identified.  Results/Tests Pending at Time of Discharge: None  Discharge Medications:    Medication List    STOP taking these medications       fesoterodine 4 MG Tb24 tablet  Commonly known as:  TOVIAZ      TAKE these medications       alendronate 70 MG tablet  Commonly known as:  FOSAMAX  take 1 tablet by mouth every week on an empty stomach with 6-8 oz of water. No food / meds for 30 min. Remain Upright     aspirin 325 MG EC tablet  Take 1 tablet (325 mg total) by mouth daily.     CALTRATE 600+D 600-400 MG-UNIT per tablet  Generic drug:  Calcium Carbonate-Vitamin D  Take 1 tablet by mouth 2 (two) times daily.     diclofenac sodium 1 % Gel  Commonly known as:  VOLTAREN  Apply 2 g topically 4 (four) times daily.     docusate sodium 100 MG capsule  Commonly known as:  COLACE  Take 2 capsules (200 mg total) by mouth every 12 (twelve) hours.     gabapentin 300 MG capsule  Commonly known as:  NEURONTIN  Take 1 capsule (300 mg total) by mouth 3 (three) times daily.     Glucosamine-Chondroitin-Vit D3 1500-1200-800 MG-MG-UNIT Pack  Take 1 tablet by mouth 2 (  two) times daily.     HYDROcodone-acetaminophen 5-325 MG per tablet  Commonly known as:  NORCO/VICODIN  Take 1 tablet by mouth every 4 (four) hours as needed for pain.     levothyroxine 112 MCG tablet  Commonly known as:  SYNTHROID, LEVOTHROID  take 1 tablet by mouth once daily     LIDODERM 5 %  Generic drug:  lidocaine  APPLY 1 PATCH TO SKIN DAILY. MAY CUT PATCH AND USE 1/4 OR 1/2 OF PATCH INSTEAD     mirtazapine 45 MG tablet  Commonly known as:  REMERON  Take 1 tablet (45 mg total) by mouth at bedtime.     polyethylene glycol packet  Commonly known as:  MIRALAX  Take 17 g by mouth daily.     raloxifene 60 MG tablet  Commonly known as:   EVISTA  take 1 tablet by mouth once daily     sertraline 50 MG tablet  Commonly known as:  ZOLOFT  take 1 and 1/2 tablets by mouth once daily     traZODone 50 MG tablet  Commonly known as:  DESYREL  take 1/2 or 1 tablet by mouth at bedtime if needed for sleep        Discharge Instructions: Please refer to Patient Instructions section of EMR for full details.  Patient was counseled important signs and symptoms that should prompt return to medical care, changes in medications, dietary instructions, activity restrictions, and follow up appointments.   Follow-Up Appointments: Follow-up Information   Follow up with Phill Myron, MD On 02/15/2013. (9:00)    Specialty:  Family Medicine   Contact information:   [place] [address] [phone]       Follow up with [facility]). (They should call you to set up a follow up appt - if you do not hear from them, give them a call in 3-4 days)    Specialty:  Cardiology   Contact information:   [address], [place] [address] [place]. Bonner Puna, MD, PGY-1 02/12/2013 6:06 PM

## 2013-02-11 NOTE — Care Management Note (Signed)
  Page 1 of 1   02/11/2013     2:02:43 PM   CARE MANAGEMENT NOTE 02/11/2013  Patient:  Jordan [name], Jordan [name]   Account Number:  [id number]  Date Initiated:  02/11/2013  Documentation initiated by:  [facility]  Subjective/Objective Assessment:   Admitted with syncope.     Action/Plan:   Anticipated DC Date:  02/13/2013   Anticipated DC Plan:  [place]  CM consult      Choice offered to / List presented to:             Status of service:  In process, will continue to follow Medicare Important Message given?   (If response is "NO", the following Medicare IM given date fields will be blank) Date Medicare IM given:   Date Additional Medicare IM given:    Discharge Disposition:    Per UR Regulation:  Reviewed for med. necessity/level of care/duration of stay  If discussed at [place] of Stay Meetings, dates discussed:    Comments:  ContactIran [name] Daughter [phone]                Jordan [name] [phone]   [phone]  02-11-13 1:50pm Ayodele Lex, RNBSN [phone] Talked with daughter in room.  Patient does not speak english.  Daughter interested in getting mom some help at home with household chores.  Son and daughter will be with her initially on discharge but think she will need extra help at home.   Offered [name] operational officer agencies.  Gave number for [facility] for [employer].  [phone] for [facility] program - process takes time.  Also suggested for PCP to call [facility] to see if can get set up with [facility] program that would be faster and at least give her some assistance.  Both require paperwork filled out that starts the process.  Daughter would like [facility] PT and RN while homebound - has had that previously with [facility] and would like them to come back.  Referral made.  Physician to order [facility] and do face to face.    Daughters number is [phone].  Call home first.

## 2013-02-12 ENCOUNTER — Ambulatory Visit: Payer: Medicare Other | Admitting: Physical Therapy

## 2013-02-12 ENCOUNTER — Telehealth: Payer: Self-pay | Admitting: Clinical

## 2013-02-12 NOTE — Telephone Encounter (Signed)
Clinical Education officer, museum (CSW) received a call from pt daughter. CSW informed daughter that the PCS form was faxed to [facility] for review, daughter appreciative. CSW also provided daughter with numbers for Adult Care Service - DSS to inquire about the process of having a family member becoming pt caregiver. No additional questions.  Hunt Oris, MSW, [place]

## 2013-02-13 DIAGNOSIS — I498 Other specified cardiac arrhythmias: Secondary | ICD-10-CM | POA: Diagnosis not present

## 2013-02-13 DIAGNOSIS — I440 Atrioventricular block, first degree: Secondary | ICD-10-CM | POA: Diagnosis not present

## 2013-02-13 DIAGNOSIS — I129 Hypertensive chronic kidney disease with stage 1 through stage 4 chronic kidney disease, or unspecified chronic kidney disease: Secondary | ICD-10-CM | POA: Diagnosis not present

## 2013-02-13 DIAGNOSIS — M810 Age-related osteoporosis without current pathological fracture: Secondary | ICD-10-CM | POA: Diagnosis not present

## 2013-02-13 DIAGNOSIS — F329 Major depressive disorder, single episode, unspecified: Secondary | ICD-10-CM | POA: Diagnosis not present

## 2013-02-13 DIAGNOSIS — M48061 Spinal stenosis, lumbar region without neurogenic claudication: Secondary | ICD-10-CM | POA: Diagnosis not present

## 2013-02-14 ENCOUNTER — Telehealth: Payer: Self-pay | Admitting: Internal Medicine

## 2013-02-14 ENCOUNTER — Telehealth: Payer: Self-pay | Admitting: Family Medicine

## 2013-02-14 ENCOUNTER — Ambulatory Visit: Payer: Medicare Other | Admitting: Physical Therapy

## 2013-02-14 DIAGNOSIS — I129 Hypertensive chronic kidney disease with stage 1 through stage 4 chronic kidney disease, or unspecified chronic kidney disease: Secondary | ICD-10-CM | POA: Diagnosis not present

## 2013-02-14 DIAGNOSIS — M48061 Spinal stenosis, lumbar region without neurogenic claudication: Secondary | ICD-10-CM | POA: Diagnosis not present

## 2013-02-14 DIAGNOSIS — I498 Other specified cardiac arrhythmias: Secondary | ICD-10-CM | POA: Diagnosis not present

## 2013-02-14 DIAGNOSIS — I440 Atrioventricular block, first degree: Secondary | ICD-10-CM | POA: Diagnosis not present

## 2013-02-14 DIAGNOSIS — F329 Major depressive disorder, single episode, unspecified: Secondary | ICD-10-CM | POA: Diagnosis not present

## 2013-02-14 NOTE — Telephone Encounter (Signed)
Given her age, I am fine with her blood pressure being at that level. Please have them monitor it and record it. They can bring the recordings to her follow-up appointment.

## 2013-02-14 NOTE — Telephone Encounter (Signed)
Pt was seen yesterday by [place] nurse and bp was running high.  Numbers were 160/70 @4 :30 and rechecked 15 min later and was 162/68.  Gave instruction to family to recheck bp and log data for physician.

## 2013-02-14 NOTE — Discharge Summary (Signed)
I have reviewed this discharge summary and agree.    

## 2013-02-14 NOTE — Telephone Encounter (Signed)
New problem    Pt's daughter would like to speak to Dr Lovena Le.    Pt felt nausea 10/15 at night to day pt is very tired.  Daughter has some questions

## 2013-02-14 NOTE — Telephone Encounter (Signed)
Spoke with daughter and let her know that Dr Lovena Le reviewed and this does not warrant a PPM Implant at this time.  Will continue to monitor.  If she continues to have nausea she will need to follow up with her PCP

## 2013-02-14 NOTE — Telephone Encounter (Signed)
Is there anything else that you would like for this pt to do?  [name],CMA

## 2013-02-15 ENCOUNTER — Inpatient Hospital Stay: Payer: Medicare Other | Admitting: Family Medicine

## 2013-02-15 ENCOUNTER — Encounter: Payer: Self-pay | Admitting: Family Medicine

## 2013-02-15 ENCOUNTER — Ambulatory Visit (INDEPENDENT_AMBULATORY_CARE_PROVIDER_SITE_OTHER): Payer: Medicare Other | Admitting: Family Medicine

## 2013-02-15 ENCOUNTER — Telehealth: Payer: Self-pay | Admitting: *Deleted

## 2013-02-15 VITALS — BP 136/65 | HR 88 | Temp 98.7°F | Ht 64.0 in | Wt 134.0 lb

## 2013-02-15 DIAGNOSIS — I10 Essential (primary) hypertension: Secondary | ICD-10-CM | POA: Diagnosis not present

## 2013-02-15 DIAGNOSIS — I129 Hypertensive chronic kidney disease with stage 1 through stage 4 chronic kidney disease, or unspecified chronic kidney disease: Secondary | ICD-10-CM | POA: Diagnosis not present

## 2013-02-15 DIAGNOSIS — H9190 Unspecified hearing loss, unspecified ear: Secondary | ICD-10-CM | POA: Diagnosis not present

## 2013-02-15 DIAGNOSIS — M48061 Spinal stenosis, lumbar region without neurogenic claudication: Secondary | ICD-10-CM

## 2013-02-15 DIAGNOSIS — I498 Other specified cardiac arrhythmias: Secondary | ICD-10-CM | POA: Diagnosis not present

## 2013-02-15 DIAGNOSIS — R55 Syncope and collapse: Secondary | ICD-10-CM

## 2013-02-15 DIAGNOSIS — M810 Age-related osteoporosis without current pathological fracture: Secondary | ICD-10-CM | POA: Diagnosis not present

## 2013-02-15 DIAGNOSIS — F329 Major depressive disorder, single episode, unspecified: Secondary | ICD-10-CM | POA: Diagnosis not present

## 2013-02-15 DIAGNOSIS — I440 Atrioventricular block, first degree: Secondary | ICD-10-CM | POA: Diagnosis not present

## 2013-02-15 NOTE — Assessment & Plan Note (Signed)
BP well controlled today: No changes in medication

## 2013-02-15 NOTE — Telephone Encounter (Signed)
LVM to call back to inform of below

## 2013-02-15 NOTE — Assessment & Plan Note (Signed)
Reports being scheduled for spinal injection next week - If injection relieves pain; would consider decreasing other pain medications

## 2013-02-15 NOTE — Assessment & Plan Note (Signed)
No new syncopal episodes since hospital d/c; but 3 sec asystole on loop recorder - minimal bloody drainage from loop recorder cath - Advised Starting ASA 325 daily; which was prescribed at discharge but she had not been taking - Advised F/u with cardiology concerning loop recorder drainage and duration or monitoring - Stopped Trazodone which she had not been taking since discharge - Stopped Gabapentin: advised calling PCP if pain worsened after stopping - Started Tylenol OTC for Low back pain: Advised not taking Vicodin and calling PCP if pain control inadequate  - Has apt for spinal injection next week - IF does well with d/c'ing gabapentin would consider stopping remeron at next visit

## 2013-02-15 NOTE — Telephone Encounter (Signed)
Pt is post Loop recorder on 10 13/14 per Dr Lovena Le. Pt had an appointment with his PCP pt asked regarding to the old drainage under the site on the loop recorder site. The PCP states he was not going to touch it and that pt needed to come to this office to be seen. Loop recorder site dressing and steri-strips where removed, area had small amount on dark blood and serous drainage. Area cleaned with gauze . The very tiny incision looks separated no stitches; new steri strips re-applied. Site with no signs of infection. Pt and daughter were made aware to see for signs of infection.

## 2013-02-15 NOTE — Telephone Encounter (Signed)
Pt seen in office 02/15/13. [name],CMA

## 2013-02-15 NOTE — Assessment & Plan Note (Signed)
Currently taking Fosamax and Evista - Did not stop Evista today due to stopping other medication - Consider stopping Evista at next PCP visit due to increased risk for colts, arthralgias and edema - Complained of nausea: but reports taking Fosamax on empty stomach with full glass of water

## 2013-02-15 NOTE — Patient Instructions (Addendum)
It was great seeing you today. Below is a list of the things we talked about:   1. Follow up with Cardiology about your Loop recorder and bleeding at cath site 2. Continue with Physical therapy apt today 3. You have not been taking Trazodone, and I would not start that back at this time 4. Continue the Sertraline 75 mg 5. Stop the Gabapentin; if you notice new symptoms after stopping for several days call your primary care doctor  Please check-out at the front desk before leaving the clinic, and make an appointment in 1 month with your Primary care doctor.   I look forward to talking with you again at our next visit. If you have any questions or concerns before then, please call the clinic at [phone].  Take Care,   Dr Phill Myron

## 2013-02-15 NOTE — Assessment & Plan Note (Signed)
Complains of decreased hearing and request ear cleaning - Ear canals clear b/l - Would perform hearing screen at PCP visit

## 2013-02-15 NOTE — Progress Notes (Signed)
[facility]  [name] MRN [id number]  Date of birth: Jan 09, 1920  CC & HPI:  Jordan [name] is a 79 y.o. female presenting today for hospital follow up for syncope. She Denies any syncopal episode since discharge, but says she was called and told that her heart stopped beating for 3 seconds one night (while on loop recorder).  She has a Therapist, sports that comes to her home to assist with medications, has an appointment with PT today at her home, and apt with "sitter" Monday. She has several questions about her medications today: She is not taking the Vicodin or Trazodone since discharge, and her daughter is curious about the effects of gabapentin, zoloft, and mertazapine on her heart. She denies any CP, Palpitations, LOC, or fevers.   ROS:  Please See HPI above otherwise negative.   Pertinent History Reviewed:  Medical & Surgical Hx:  Reviewed.  Medications & Allergies: Reviewed & Updated - see associated section  Social History: Reviewed:   reports that she has never smoked. She has never used smokeless tobacco.  History  Alcohol Use No   History  Drug Use No    Objective Findings:  Vitals: BP 136/65  Pulse 88  Temp(Src) 98.7 F (37.1 C) (Oral)  Ht 5\' 4"  (1.626 m)  Wt 134 lb (60.782 kg)  BMI 22.99 kg/m2  Gen: NAD CV: RRR w/o m/r/g, pulses +2 b/l; Loop record in place (small amount of drainage under adhesive) Resp: CTAB w/ normal respiratory effort GI: Diffuse abdominal bruises (likely from Lovenox injection in hospital); BS + x 4 quads; No tenderness or masses Lower Ext: No skin changes; No edema; distal pulses intact; Calves nontender   Assessment & Plan:   Please See Problem Focused Assessment & Plan

## 2013-02-18 ENCOUNTER — Telehealth: Payer: Self-pay | Admitting: Clinical

## 2013-02-18 NOTE — Telephone Encounter (Signed)
Clinical Education officer, museum (CSW) received a call from pt daughter. Daughter notified CSW that an nurse from [facility] came to pt home today to do a home assessment. Pt daughter has been given a list to choose an agency. Daughter is hopeful that services will be approved within the next 2 weeks.  Hunt Oris, MSW, [place]

## 2013-02-19 ENCOUNTER — Telehealth: Payer: Self-pay | Admitting: Internal Medicine

## 2013-02-19 DIAGNOSIS — F329 Major depressive disorder, single episode, unspecified: Secondary | ICD-10-CM | POA: Diagnosis not present

## 2013-02-19 DIAGNOSIS — I440 Atrioventricular block, first degree: Secondary | ICD-10-CM | POA: Diagnosis not present

## 2013-02-19 DIAGNOSIS — I498 Other specified cardiac arrhythmias: Secondary | ICD-10-CM | POA: Diagnosis not present

## 2013-02-19 DIAGNOSIS — M48061 Spinal stenosis, lumbar region without neurogenic claudication: Secondary | ICD-10-CM | POA: Diagnosis not present

## 2013-02-19 DIAGNOSIS — I129 Hypertensive chronic kidney disease with stage 1 through stage 4 chronic kidney disease, or unspecified chronic kidney disease: Secondary | ICD-10-CM | POA: Diagnosis not present

## 2013-02-19 NOTE — Telephone Encounter (Signed)
Spoke with Iran Sizer (daughter), and explained when to activate loop recorder for symptoms of heart racing, syncope.  The patient is c/o nausea and vomiting.  I referred them to her PCP.

## 2013-02-19 NOTE — Telephone Encounter (Signed)
New message    Pt is nauseated---sometimes throws up clear fluids---has a loop recorder--Is this a symptom where she should press the button on her recorder and record this?

## 2013-02-20 DIAGNOSIS — M48061 Spinal stenosis, lumbar region without neurogenic claudication: Secondary | ICD-10-CM | POA: Diagnosis not present

## 2013-02-20 DIAGNOSIS — IMO0002 Reserved for concepts with insufficient information to code with codable children: Secondary | ICD-10-CM | POA: Diagnosis not present

## 2013-02-20 NOTE — Telephone Encounter (Signed)
Wonderful news.

## 2013-02-21 ENCOUNTER — Encounter: Payer: Self-pay | Admitting: Internal Medicine

## 2013-02-21 ENCOUNTER — Ambulatory Visit (INDEPENDENT_AMBULATORY_CARE_PROVIDER_SITE_OTHER): Payer: Medicare Other | Admitting: *Deleted

## 2013-02-21 DIAGNOSIS — R55 Syncope and collapse: Secondary | ICD-10-CM

## 2013-02-21 DIAGNOSIS — I129 Hypertensive chronic kidney disease with stage 1 through stage 4 chronic kidney disease, or unspecified chronic kidney disease: Secondary | ICD-10-CM | POA: Diagnosis not present

## 2013-02-21 DIAGNOSIS — I498 Other specified cardiac arrhythmias: Secondary | ICD-10-CM | POA: Diagnosis not present

## 2013-02-21 DIAGNOSIS — M48061 Spinal stenosis, lumbar region without neurogenic claudication: Secondary | ICD-10-CM | POA: Diagnosis not present

## 2013-02-21 DIAGNOSIS — I440 Atrioventricular block, first degree: Secondary | ICD-10-CM | POA: Diagnosis not present

## 2013-02-21 DIAGNOSIS — F329 Major depressive disorder, single episode, unspecified: Secondary | ICD-10-CM | POA: Diagnosis not present

## 2013-02-21 LAB — PACEMAKER DEVICE OBSERVATION

## 2013-02-21 NOTE — Progress Notes (Signed)
Pt seen in device clinic for follow up of recently implanted ILR.  Wound well healed.  No redness, swelling, or edema.  Steri-strips removed today.   Device interrogated and found to be functioning normally.  No changes made today. See PaceArt for full details.  Pt denies chest pain, shortness of breath, palpitations, or dizziness.  Pt to follow up with Dr. Lovena Le in 3 months.   Kier Smead 02/21/2013 1:52 PM

## 2013-02-22 DIAGNOSIS — I498 Other specified cardiac arrhythmias: Secondary | ICD-10-CM | POA: Diagnosis not present

## 2013-02-22 DIAGNOSIS — I440 Atrioventricular block, first degree: Secondary | ICD-10-CM | POA: Diagnosis not present

## 2013-02-22 DIAGNOSIS — F329 Major depressive disorder, single episode, unspecified: Secondary | ICD-10-CM | POA: Diagnosis not present

## 2013-02-22 DIAGNOSIS — I129 Hypertensive chronic kidney disease with stage 1 through stage 4 chronic kidney disease, or unspecified chronic kidney disease: Secondary | ICD-10-CM | POA: Diagnosis not present

## 2013-02-22 DIAGNOSIS — M48061 Spinal stenosis, lumbar region without neurogenic claudication: Secondary | ICD-10-CM | POA: Diagnosis not present

## 2013-02-25 ENCOUNTER — Telehealth: Payer: Self-pay | Admitting: Family Medicine

## 2013-02-25 DIAGNOSIS — I440 Atrioventricular block, first degree: Secondary | ICD-10-CM | POA: Diagnosis not present

## 2013-02-25 DIAGNOSIS — I129 Hypertensive chronic kidney disease with stage 1 through stage 4 chronic kidney disease, or unspecified chronic kidney disease: Secondary | ICD-10-CM | POA: Diagnosis not present

## 2013-02-25 DIAGNOSIS — M48061 Spinal stenosis, lumbar region without neurogenic claudication: Secondary | ICD-10-CM | POA: Diagnosis not present

## 2013-02-25 DIAGNOSIS — I498 Other specified cardiac arrhythmias: Secondary | ICD-10-CM | POA: Diagnosis not present

## 2013-02-25 DIAGNOSIS — F329 Major depressive disorder, single episode, unspecified: Secondary | ICD-10-CM | POA: Diagnosis not present

## 2013-02-25 NOTE — Telephone Encounter (Signed)
Pt is nauseted esp in the morning. She has it sometimes during the day. Not vomiting. She spitting up some saliva. Only medicine change is she has started taking baby aspirin. Please advise

## 2013-02-25 NOTE — Telephone Encounter (Signed)
They should call the cardiologists office to discuss this. They recommended this medication while she was in the hospital given her syncopal episode and they placed a loop recorder during the hospitalization, so the aspirin may be necessary with this device, though I am unsure of this. Advise her to take this medication with a meal so as to limit upset stomach.

## 2013-02-25 NOTE — Telephone Encounter (Signed)
LMOVM for [name] to call back.  Please inform of the below. Glada Wickstrom, [name]

## 2013-02-25 NOTE — Telephone Encounter (Signed)
Dr. Caryl Bis,  Pt has appt with you on 03/20/13.  Can she stop the baby aspirin until then, if they feel this is causing it? Amayrany Cafaro, [name]

## 2013-02-26 ENCOUNTER — Telehealth: Payer: Self-pay | Admitting: Internal Medicine

## 2013-02-26 DIAGNOSIS — I498 Other specified cardiac arrhythmias: Secondary | ICD-10-CM | POA: Diagnosis not present

## 2013-02-26 DIAGNOSIS — F329 Major depressive disorder, single episode, unspecified: Secondary | ICD-10-CM | POA: Diagnosis not present

## 2013-02-26 DIAGNOSIS — I129 Hypertensive chronic kidney disease with stage 1 through stage 4 chronic kidney disease, or unspecified chronic kidney disease: Secondary | ICD-10-CM | POA: Diagnosis not present

## 2013-02-26 DIAGNOSIS — I440 Atrioventricular block, first degree: Secondary | ICD-10-CM | POA: Diagnosis not present

## 2013-02-26 DIAGNOSIS — M48061 Spinal stenosis, lumbar region without neurogenic claudication: Secondary | ICD-10-CM | POA: Diagnosis not present

## 2013-02-26 NOTE — Telephone Encounter (Signed)
New message     Nausea for approx 10da---mainly in the morning when she gets up---could it be the asprin 325mg ? She was taking 81mg  asprin

## 2013-02-26 NOTE — Telephone Encounter (Signed)
Spoke with daughter and let her know to decrease the ASA to 81mg  and see if her symptoms subside or get better.  If the symptoms to not subside then she should increase the ASA back to the 325mg  and contact her PCP  Daughter verbalizes understanding and agrees with plan

## 2013-02-27 DIAGNOSIS — I498 Other specified cardiac arrhythmias: Secondary | ICD-10-CM | POA: Diagnosis not present

## 2013-02-27 DIAGNOSIS — I129 Hypertensive chronic kidney disease with stage 1 through stage 4 chronic kidney disease, or unspecified chronic kidney disease: Secondary | ICD-10-CM | POA: Diagnosis not present

## 2013-02-27 DIAGNOSIS — F329 Major depressive disorder, single episode, unspecified: Secondary | ICD-10-CM | POA: Diagnosis not present

## 2013-02-27 DIAGNOSIS — M48061 Spinal stenosis, lumbar region without neurogenic claudication: Secondary | ICD-10-CM | POA: Diagnosis not present

## 2013-02-27 DIAGNOSIS — I440 Atrioventricular block, first degree: Secondary | ICD-10-CM | POA: Diagnosis not present

## 2013-02-28 DIAGNOSIS — I440 Atrioventricular block, first degree: Secondary | ICD-10-CM | POA: Diagnosis not present

## 2013-02-28 DIAGNOSIS — F329 Major depressive disorder, single episode, unspecified: Secondary | ICD-10-CM | POA: Diagnosis not present

## 2013-02-28 DIAGNOSIS — M48061 Spinal stenosis, lumbar region without neurogenic claudication: Secondary | ICD-10-CM | POA: Diagnosis not present

## 2013-02-28 DIAGNOSIS — I129 Hypertensive chronic kidney disease with stage 1 through stage 4 chronic kidney disease, or unspecified chronic kidney disease: Secondary | ICD-10-CM | POA: Diagnosis not present

## 2013-02-28 DIAGNOSIS — I498 Other specified cardiac arrhythmias: Secondary | ICD-10-CM | POA: Diagnosis not present

## 2013-03-04 ENCOUNTER — Telehealth: Payer: Self-pay | Admitting: Family Medicine

## 2013-03-04 NOTE — Telephone Encounter (Signed)
Was this forwarded to [facility] as well? If not I would be happy to touch base with the patients daughter to discuss this.

## 2013-03-04 NOTE — Telephone Encounter (Signed)
Nurse came today for home health assessment. She is going to resubmit the forms so they can get some help fixing meals. Daughter would like Dr T to call her so she can discuss this

## 2013-03-04 NOTE — Telephone Encounter (Signed)
Dr T gave the authorization for in home care.  The  Nurse came today and realized pt cannot do anything for himself. Nurse is going to resubmit the paper

## 2013-03-05 DIAGNOSIS — I129 Hypertensive chronic kidney disease with stage 1 through stage 4 chronic kidney disease, or unspecified chronic kidney disease: Secondary | ICD-10-CM | POA: Diagnosis not present

## 2013-03-05 DIAGNOSIS — M48061 Spinal stenosis, lumbar region without neurogenic claudication: Secondary | ICD-10-CM | POA: Diagnosis not present

## 2013-03-05 DIAGNOSIS — I440 Atrioventricular block, first degree: Secondary | ICD-10-CM | POA: Diagnosis not present

## 2013-03-05 DIAGNOSIS — F329 Major depressive disorder, single episode, unspecified: Secondary | ICD-10-CM | POA: Diagnosis not present

## 2013-03-05 DIAGNOSIS — I498 Other specified cardiac arrhythmias: Secondary | ICD-10-CM | POA: Diagnosis not present

## 2013-03-05 NOTE — Telephone Encounter (Signed)
No I only forwarded to you as PCP, I thought that [name] was covering your box at the time and that is why she signed off for this. Berwyn Bigley, [name]

## 2013-03-06 NOTE — Telephone Encounter (Signed)
Called the patients daughter to discuss nursing needs at home. Taking lots of hour to take care of her mother. The nurse who did the evaluation will re-fax the paper work to get the patient more help at home. Has memory loss and this may prevent her from taking adequate care of herself at home without the help of her daughter and son in a safe manner. Will fill out the forms once they are available.  Daughter also states her mother has decreased the dosage of her aspirin due to nausea. They spoke with the cardiology nurse who stated she should decrease back to the 81 mg of aspirin. Since she has done this, she has had progressive decrease in nausea. I advised them to continue the 81 mg and to take this with food. They are to let me know if this worsens, otherwise we will further address it at her next follow-up appointment in 2 weeks.

## 2013-03-07 DIAGNOSIS — I498 Other specified cardiac arrhythmias: Secondary | ICD-10-CM | POA: Diagnosis not present

## 2013-03-07 DIAGNOSIS — F329 Major depressive disorder, single episode, unspecified: Secondary | ICD-10-CM | POA: Diagnosis not present

## 2013-03-07 DIAGNOSIS — I129 Hypertensive chronic kidney disease with stage 1 through stage 4 chronic kidney disease, or unspecified chronic kidney disease: Secondary | ICD-10-CM | POA: Diagnosis not present

## 2013-03-07 DIAGNOSIS — M48061 Spinal stenosis, lumbar region without neurogenic claudication: Secondary | ICD-10-CM | POA: Diagnosis not present

## 2013-03-07 DIAGNOSIS — I440 Atrioventricular block, first degree: Secondary | ICD-10-CM | POA: Diagnosis not present

## 2013-03-11 ENCOUNTER — Telehealth: Payer: Self-pay | Admitting: *Deleted

## 2013-03-11 ENCOUNTER — Telehealth: Payer: Self-pay

## 2013-03-11 NOTE — Telephone Encounter (Signed)
Physical therapist with [facility] requesting verbal order for home PT twice weekly for 3 more weeks. Will forward to PCP.

## 2013-03-11 NOTE — Telephone Encounter (Signed)
Daughter calls stating mother has been nauseated since last hospitalization in October. Would like to speak to MD/nurse.

## 2013-03-11 NOTE — Telephone Encounter (Signed)
Spoke with [employer].  Pt has decreased ASA (per cardiology) x 10 days and is still feeling nauseated.  They advised that if she was still feeling nauseated to contact PCP for next step.  Jordan [name] states that she is still drinking 6 glasses of H2O/day and has a bad taste in her mouth. Advised a BRAT diet and that I would send message to MD.   Also, Jordan [name] would like to know if Dr. Caryl Bis would allow her to start her [name] again since they stopped it in the hospital. [name], [name]

## 2013-03-12 DIAGNOSIS — I440 Atrioventricular block, first degree: Secondary | ICD-10-CM | POA: Diagnosis not present

## 2013-03-12 DIAGNOSIS — I498 Other specified cardiac arrhythmias: Secondary | ICD-10-CM | POA: Diagnosis not present

## 2013-03-12 DIAGNOSIS — F329 Major depressive disorder, single episode, unspecified: Secondary | ICD-10-CM | POA: Diagnosis not present

## 2013-03-12 DIAGNOSIS — M48061 Spinal stenosis, lumbar region without neurogenic claudication: Secondary | ICD-10-CM | POA: Diagnosis not present

## 2013-03-12 DIAGNOSIS — I129 Hypertensive chronic kidney disease with stage 1 through stage 4 chronic kidney disease, or unspecified chronic kidney disease: Secondary | ICD-10-CM | POA: Diagnosis not present

## 2013-03-12 NOTE — Telephone Encounter (Signed)
Cecile is aware of this order. Chanta Bauers,CMA

## 2013-03-12 NOTE — Telephone Encounter (Signed)
Patient should come to the office for evaluation of this issue.

## 2013-03-12 NOTE — Telephone Encounter (Signed)
It is ok to give a verbal order for continued PT.

## 2013-03-13 NOTE — Telephone Encounter (Signed)
Pt wants to wait until Wednesday and see Dr. Caryl Bis instead of seeing another MD.  Advised of red flags to go to ED. Fanny Agan, [name]

## 2013-03-14 ENCOUNTER — Ambulatory Visit (INDEPENDENT_AMBULATORY_CARE_PROVIDER_SITE_OTHER): Payer: Medicare Other | Admitting: *Deleted

## 2013-03-14 DIAGNOSIS — M67919 Unspecified disorder of synovium and tendon, unspecified shoulder: Secondary | ICD-10-CM | POA: Diagnosis not present

## 2013-03-14 DIAGNOSIS — R55 Syncope and collapse: Secondary | ICD-10-CM

## 2013-03-14 DIAGNOSIS — H26499 Other secondary cataract, unspecified eye: Secondary | ICD-10-CM | POA: Diagnosis not present

## 2013-03-14 DIAGNOSIS — IMO0002 Reserved for concepts with insufficient information to code with codable children: Secondary | ICD-10-CM | POA: Diagnosis not present

## 2013-03-14 DIAGNOSIS — Z79899 Other long term (current) drug therapy: Secondary | ICD-10-CM | POA: Diagnosis not present

## 2013-03-14 DIAGNOSIS — M48061 Spinal stenosis, lumbar region without neurogenic claudication: Secondary | ICD-10-CM | POA: Diagnosis not present

## 2013-03-14 DIAGNOSIS — H4011X0 Primary open-angle glaucoma, stage unspecified: Secondary | ICD-10-CM | POA: Diagnosis not present

## 2013-03-15 DIAGNOSIS — I498 Other specified cardiac arrhythmias: Secondary | ICD-10-CM | POA: Diagnosis not present

## 2013-03-15 DIAGNOSIS — F329 Major depressive disorder, single episode, unspecified: Secondary | ICD-10-CM | POA: Diagnosis not present

## 2013-03-15 DIAGNOSIS — I440 Atrioventricular block, first degree: Secondary | ICD-10-CM | POA: Diagnosis not present

## 2013-03-15 DIAGNOSIS — I129 Hypertensive chronic kidney disease with stage 1 through stage 4 chronic kidney disease, or unspecified chronic kidney disease: Secondary | ICD-10-CM | POA: Diagnosis not present

## 2013-03-15 DIAGNOSIS — M48061 Spinal stenosis, lumbar region without neurogenic claudication: Secondary | ICD-10-CM | POA: Diagnosis not present

## 2013-03-18 ENCOUNTER — Telehealth: Payer: Self-pay

## 2013-03-18 ENCOUNTER — Telehealth: Payer: Self-pay | Admitting: Family Medicine

## 2013-03-18 DIAGNOSIS — I440 Atrioventricular block, first degree: Secondary | ICD-10-CM | POA: Diagnosis not present

## 2013-03-18 DIAGNOSIS — F329 Major depressive disorder, single episode, unspecified: Secondary | ICD-10-CM | POA: Diagnosis not present

## 2013-03-18 DIAGNOSIS — I498 Other specified cardiac arrhythmias: Secondary | ICD-10-CM | POA: Diagnosis not present

## 2013-03-18 DIAGNOSIS — I129 Hypertensive chronic kidney disease with stage 1 through stage 4 chronic kidney disease, or unspecified chronic kidney disease: Secondary | ICD-10-CM | POA: Diagnosis not present

## 2013-03-18 DIAGNOSIS — M48061 Spinal stenosis, lumbar region without neurogenic claudication: Secondary | ICD-10-CM | POA: Diagnosis not present

## 2013-03-18 NOTE — Telephone Encounter (Signed)
Daughter in law called back because page 2 of the forms needs the doctors signature. She needs [place] to call her before faxing back to her. jw

## 2013-03-18 NOTE — Telephone Encounter (Signed)
Forms placed back in MD's box.  [name],CMA

## 2013-03-18 NOTE — Telephone Encounter (Signed)
Spoke with [employer] and she asked to have forms faxed to her since home health doesn't seem to have them.  Faxed to [phone].  Roc Streett,CMA

## 2013-03-18 NOTE — Telephone Encounter (Signed)
Forms were faxed to Dr. Caryl Bis about a week ago pertaining to patient's home health care. Daughter calls because form have not been completed and returned. Please call patient's daughter.

## 2013-03-18 NOTE — Telephone Encounter (Signed)
I filled the forms out some time last week and placed them in the to be faxed box last week. Please inform daughter of this. If the home health company does not acknowledge recepit of them by the middle of the week they should let me know. Thanks.

## 2013-03-19 ENCOUNTER — Encounter: Payer: Self-pay | Admitting: Internal Medicine

## 2013-03-19 DIAGNOSIS — I129 Hypertensive chronic kidney disease with stage 1 through stage 4 chronic kidney disease, or unspecified chronic kidney disease: Secondary | ICD-10-CM | POA: Diagnosis not present

## 2013-03-19 DIAGNOSIS — I498 Other specified cardiac arrhythmias: Secondary | ICD-10-CM | POA: Diagnosis not present

## 2013-03-19 DIAGNOSIS — F329 Major depressive disorder, single episode, unspecified: Secondary | ICD-10-CM | POA: Diagnosis not present

## 2013-03-19 DIAGNOSIS — I440 Atrioventricular block, first degree: Secondary | ICD-10-CM | POA: Diagnosis not present

## 2013-03-19 DIAGNOSIS — M48061 Spinal stenosis, lumbar region without neurogenic claudication: Secondary | ICD-10-CM | POA: Diagnosis not present

## 2013-03-20 ENCOUNTER — Telehealth: Payer: Self-pay | Admitting: Internal Medicine

## 2013-03-20 ENCOUNTER — Ambulatory Visit (INDEPENDENT_AMBULATORY_CARE_PROVIDER_SITE_OTHER): Payer: Medicare Other | Admitting: Family Medicine

## 2013-03-20 ENCOUNTER — Encounter: Payer: Self-pay | Admitting: Family Medicine

## 2013-03-20 VITALS — BP 130/62 | HR 82 | Temp 97.9°F | Ht 64.0 in | Wt 133.0 lb

## 2013-03-20 DIAGNOSIS — M48061 Spinal stenosis, lumbar region without neurogenic claudication: Secondary | ICD-10-CM

## 2013-03-20 DIAGNOSIS — R799 Abnormal finding of blood chemistry, unspecified: Secondary | ICD-10-CM

## 2013-03-20 DIAGNOSIS — E039 Hypothyroidism, unspecified: Secondary | ICD-10-CM

## 2013-03-20 DIAGNOSIS — R110 Nausea: Secondary | ICD-10-CM

## 2013-03-20 DIAGNOSIS — M810 Age-related osteoporosis without current pathological fracture: Secondary | ICD-10-CM | POA: Diagnosis not present

## 2013-03-20 DIAGNOSIS — N3941 Urge incontinence: Secondary | ICD-10-CM

## 2013-03-20 DIAGNOSIS — M545 Low back pain: Secondary | ICD-10-CM | POA: Diagnosis not present

## 2013-03-20 DIAGNOSIS — R7989 Other specified abnormal findings of blood chemistry: Secondary | ICD-10-CM

## 2013-03-20 LAB — MDC_IDC_ENUM_SESS_TYPE_INCLINIC

## 2013-03-20 NOTE — Telephone Encounter (Signed)
Ok to have MRI with ILR  She will call us back with date and time of her mom's procedure so we can let [employer] know

## 2013-03-20 NOTE — Progress Notes (Signed)
Patient ID: Jordan [name], female   DOB: 03-08-1920, 79 y.o.   MRN: [id number]  [name], MD Phone: [phone]  Jordan [name] is a 79 y.o. female who presents today for f/u of nausea, osteoporosis, pain management, and stress incontinence. Daughter and son provide translation.  Nausea: patient has noted nausea since she left the hospital in October. Noted the only change in medication was addition of aspirin by the cardiologist. She has decreased from 325 mg daily to 81 mg daily and noticed an improvement in the nausea. She subsequently stopped the 81 mg daily 2 days after she noted the resolution of the nausea. She denied any abdominal pain, vomiting, or reflux with this nausea.  Osteoporosis: patient has been on fosamax for 10 years and evista for 5-6 years. She does not have a history of breast cancer or fracture. Her last T-score was -2.6 at the femur neck. She takes calcium and vitamin D supplements as well.  Pain management: patient has been followed by pain management for her right shoulder pain. She has received an injection in this shoulder and had a good response with decreased pain and increased range of motion. Since the injection she has not taken her vicodin for pain. She has f/u in 7 weeks for this and has an injection scheduled for her back pain on 12/4.  Stress incontinence: patient with a long history of this issue. They note that the patient was taken off of toviaz when hospitalized for syncope. She had stayed off of this for a while though had increase in incontinence so started back on toviaz. They want to know if this is ok for her to be on. They have not seen Dr Jeffie Pollock for this issue in several months.   Past Medical History  Diagnosis Date  . Lung mass     right apical need CT in end of 2012  . Cervicalgia   . Peripheral neuropathy   . Frailty   . Hypertension   . Hypothyroid   . Osteoporosis   . Insomnia   . Arthritis     History  Smoking status  . Never  Smoker   Smokeless tobacco  . Never Used    Family History  Problem Relation Age of Onset  . Kidney disease Mother     Current Outpatient Prescriptions on File Prior to Visit  Medication Sig Dispense Refill  . alendronate (FOSAMAX) 70 MG tablet take 1 tablet by mouth every week on an empty stomach with 6-8 oz of water. No food / meds for 30 min. Remain Upright  4 tablet  3  . Calcium Carbonate-Vitamin D (CALTRATE 600+D) 600-400 MG-UNIT per tablet Take 1 tablet by mouth 2 (two) times daily.        . diclofenac sodium (VOLTAREN) 1 % GEL Apply 2 g topically 4 (four) times daily.  10 Tube  1  . docusate sodium (COLACE) 100 MG capsule Take 2 capsules (200 mg total) by mouth every 12 (twelve) hours.  60 capsule  0  . gabapentin (NEURONTIN) 300 MG capsule Take 1 capsule (300 mg total) by mouth 3 (three) times daily.  270 capsule  2  . Glucosamine-Chondroitin-Vit D3 1500-1200-800 MG-MG-UNIT PACK Take 1 tablet by mouth 2 (two) times daily.        Marland Kitchen HYDROcodone-acetaminophen (NORCO/VICODIN) 5-325 MG per tablet Take 1 tablet by mouth every 4 (four) hours as needed for pain.  30 tablet  0  . levothyroxine (SYNTHROID, LEVOTHROID) 112 MCG tablet take  1 tablet by mouth once daily  90 tablet  1  . LIDODERM 5 % APPLY 1 PATCH TO SKIN DAILY. MAY CUT PATCH AND USE 1/4 OR 1/2 OF PATCH INSTEAD  90 each  1  . mirtazapine (REMERON) 45 MG tablet Take 1 tablet (45 mg total) by mouth at bedtime.  90 tablet  3  . polyethylene glycol (MIRALAX) packet Take 17 g by mouth daily.  14 each  0  . raloxifene (EVISTA) 60 MG tablet take 1 tablet by mouth once daily  60 tablet  1  . sertraline (ZOLOFT) 50 MG tablet take 1 and 1/2 tablets by mouth once daily      . traZODone (DESYREL) 50 MG tablet take 1/2 or 1 tablet by mouth at bedtime if needed for sleep  30 tablet  2   No current facility-administered medications on file prior to visit.    ROS: Per HPI   Physical Exam Filed Vitals:   03/20/13 1121  BP: 130/62    Pulse: 82  Temp: 97.9 F (36.6 C)    Physical Examination: General appearance - alert, well appearing, and in no distress Chest - clear to auscultation, no wheezes, rales or rhonchi, symmetric air entry Heart - normal rate, regular rhythm, normal S1, S2, no murmurs, rubs, clicks or gallops Abdomen - soft, nontender, nondistended, no masses or organomegaly   Assessment/Plan: Please see individual problem list.  I have spent >25 minutes in the care of this patient with >50% spent in counseling/coordination of care regarding nausea, osteoporosis, pain management, and incontinence.

## 2013-03-20 NOTE — Telephone Encounter (Signed)
Forms completed and placed in the to fax box.

## 2013-03-20 NOTE — Telephone Encounter (Signed)
New Problem:  Pt's daughter states they just went to [place], [facility]ero surgeons office, and he wants to do an MRI. Dr. Hal Neer is considering during a vertebroplasty procedure... Pt's daughter wants to know what Dr. Tanna Furry thoughts are and if it is safe for her to have an MRI with a device. Please advise

## 2013-03-20 NOTE — Patient Instructions (Signed)
Nice to see you. I'm glad you are feeling better. We are going to give a trial of aspirin 81 mg a day. If your nausea returns please stop this medication. Please stop the evista. Please call Dr Ralene Muskrat office to discuss alternative options for the toviaz. Given the anticholinergic aspect of this medication I would prefer for her to be on something else to help minimize the side effect risk.

## 2013-03-21 LAB — COMPREHENSIVE METABOLIC PANEL
ALT: 16 U/L (ref 0–35)
AST: 18 U/L (ref 0–37)
Albumin: 3.4 g/dL — ABNORMAL LOW (ref 3.5–5.2)
Alkaline Phosphatase: 71 U/L (ref 39–117)
BUN: 38 mg/dL — ABNORMAL HIGH (ref 6–23)
Creat: 1.16 mg/dL — ABNORMAL HIGH (ref 0.50–1.10)
Glucose, Bld: 87 mg/dL (ref 70–99)
Potassium: 4.1 mEq/L (ref 3.5–5.3)
Total Bilirubin: 0.3 mg/dL (ref 0.3–1.2)

## 2013-03-22 ENCOUNTER — Telehealth: Payer: Self-pay | Admitting: *Deleted

## 2013-03-22 DIAGNOSIS — M48061 Spinal stenosis, lumbar region without neurogenic claudication: Secondary | ICD-10-CM | POA: Diagnosis not present

## 2013-03-22 DIAGNOSIS — R110 Nausea: Secondary | ICD-10-CM | POA: Insufficient documentation

## 2013-03-22 DIAGNOSIS — I129 Hypertensive chronic kidney disease with stage 1 through stage 4 chronic kidney disease, or unspecified chronic kidney disease: Secondary | ICD-10-CM | POA: Diagnosis not present

## 2013-03-22 DIAGNOSIS — I498 Other specified cardiac arrhythmias: Secondary | ICD-10-CM | POA: Diagnosis not present

## 2013-03-22 DIAGNOSIS — F329 Major depressive disorder, single episode, unspecified: Secondary | ICD-10-CM | POA: Diagnosis not present

## 2013-03-22 DIAGNOSIS — I440 Atrioventricular block, first degree: Secondary | ICD-10-CM | POA: Diagnosis not present

## 2013-03-22 NOTE — Telephone Encounter (Signed)
Message copied by Bobbye Riggs on Fri Mar 22, 2013  4:21 PM ------      Message from: [place], ERIC G      Created: Fri Mar 22, 2013  4:16 PM       Patients renal function (Cr and BUN) are slightly higher than her previous values. The patient should return early next week for a repeat BMET to ensure this is stable. ------

## 2013-03-22 NOTE — Assessment & Plan Note (Signed)
Patient advised to hold off on toviaz at this time given anticholinergic properties especially in elderly patients. I advised the daughter to call Dr Ralene Muskrat office to see if there are any alternatives to this medication that have not previously been tried. Note the patient has failed a number of medications for treatment of this issue.

## 2013-03-22 NOTE — Assessment & Plan Note (Signed)
Patient with last dexa scan revealing t-score -2.6. Has been on evista and fosamax for many years. Will d/c evista at this visit and will consider stopping fosamax at the next visit given the length of treatment to this point. It would be beneficial for the patient to come off of these medications to allow for new bone turnover. Could consider restarting fosamax after a year of being off of this.

## 2013-03-22 NOTE — Telephone Encounter (Signed)
LMOVM informing [name] of below. Karina Nofsinger, [name]

## 2013-03-22 NOTE — Assessment & Plan Note (Signed)
Nausea has resolved at this time. Improved following decrease in dosage of aspirin, so the high dosage may be the culprit in this. Will continue to monitor this. Advised to take aspirin and any pain medication used with food. Will check CMET.

## 2013-03-22 NOTE — Assessment & Plan Note (Signed)
Patient to continue to follow with pain management for injections and management of back and shoulder pain.

## 2013-03-22 NOTE — Addendum Note (Signed)
Addended by: Leone Haven on: 03/22/2013 06:22 PM   Modules accepted: Orders

## 2013-03-25 ENCOUNTER — Other Ambulatory Visit: Payer: Self-pay | Admitting: Family Medicine

## 2013-03-25 MED ORDER — ALENDRONATE SODIUM 70 MG PO TABS: ORAL_TABLET | ORAL | Status: DC

## 2013-03-26 DIAGNOSIS — I440 Atrioventricular block, first degree: Secondary | ICD-10-CM | POA: Diagnosis not present

## 2013-03-26 DIAGNOSIS — M48061 Spinal stenosis, lumbar region without neurogenic claudication: Secondary | ICD-10-CM | POA: Diagnosis not present

## 2013-03-26 DIAGNOSIS — I129 Hypertensive chronic kidney disease with stage 1 through stage 4 chronic kidney disease, or unspecified chronic kidney disease: Secondary | ICD-10-CM | POA: Diagnosis not present

## 2013-03-26 DIAGNOSIS — F329 Major depressive disorder, single episode, unspecified: Secondary | ICD-10-CM | POA: Diagnosis not present

## 2013-03-26 DIAGNOSIS — I498 Other specified cardiac arrhythmias: Secondary | ICD-10-CM | POA: Diagnosis not present

## 2013-03-27 ENCOUNTER — Encounter: Payer: Self-pay | Admitting: Internal Medicine

## 2013-03-27 ENCOUNTER — Other Ambulatory Visit: Payer: Medicare Other

## 2013-03-27 DIAGNOSIS — R7989 Other specified abnormal findings of blood chemistry: Secondary | ICD-10-CM

## 2013-03-27 DIAGNOSIS — R799 Abnormal finding of blood chemistry, unspecified: Secondary | ICD-10-CM | POA: Diagnosis not present

## 2013-03-27 LAB — BASIC METABOLIC PANEL
BUN: 26 mg/dL — ABNORMAL HIGH (ref 6–23)
CO2: 26 mEq/L (ref 19–32)
Chloride: 106 mEq/L (ref 96–112)
Creat: 0.98 mg/dL (ref 0.50–1.10)
Potassium: 4.1 mEq/L (ref 3.5–5.3)

## 2013-03-27 NOTE — Progress Notes (Signed)
BMP DONE TODAY Jordan [name] 

## 2013-03-29 ENCOUNTER — Other Ambulatory Visit: Payer: Self-pay | Admitting: Neurosurgery

## 2013-03-29 DIAGNOSIS — I440 Atrioventricular block, first degree: Secondary | ICD-10-CM | POA: Diagnosis not present

## 2013-03-29 DIAGNOSIS — F329 Major depressive disorder, single episode, unspecified: Secondary | ICD-10-CM | POA: Diagnosis not present

## 2013-03-29 DIAGNOSIS — M48061 Spinal stenosis, lumbar region without neurogenic claudication: Secondary | ICD-10-CM | POA: Diagnosis not present

## 2013-03-29 DIAGNOSIS — M545 Low back pain: Secondary | ICD-10-CM

## 2013-03-29 DIAGNOSIS — I498 Other specified cardiac arrhythmias: Secondary | ICD-10-CM | POA: Diagnosis not present

## 2013-03-29 DIAGNOSIS — I129 Hypertensive chronic kidney disease with stage 1 through stage 4 chronic kidney disease, or unspecified chronic kidney disease: Secondary | ICD-10-CM | POA: Diagnosis not present

## 2013-04-01 ENCOUNTER — Telehealth: Payer: Self-pay | Admitting: Internal Medicine

## 2013-04-01 ENCOUNTER — Telehealth: Payer: Self-pay | Admitting: *Deleted

## 2013-04-01 DIAGNOSIS — F329 Major depressive disorder, single episode, unspecified: Secondary | ICD-10-CM | POA: Diagnosis not present

## 2013-04-01 DIAGNOSIS — I498 Other specified cardiac arrhythmias: Secondary | ICD-10-CM | POA: Diagnosis not present

## 2013-04-01 DIAGNOSIS — I129 Hypertensive chronic kidney disease with stage 1 through stage 4 chronic kidney disease, or unspecified chronic kidney disease: Secondary | ICD-10-CM | POA: Diagnosis not present

## 2013-04-01 DIAGNOSIS — M48061 Spinal stenosis, lumbar region without neurogenic claudication: Secondary | ICD-10-CM | POA: Diagnosis not present

## 2013-04-01 DIAGNOSIS — I440 Atrioventricular block, first degree: Secondary | ICD-10-CM | POA: Diagnosis not present

## 2013-04-01 NOTE — Telephone Encounter (Signed)
New message    Pt has a loop recorder and needs a MRI---daughter need model number and other info to give to MRI people.

## 2013-04-01 NOTE — Telephone Encounter (Signed)
Message copied by [name] on Mon Apr 01, 2013 10:13 AM ------      Message from: Caryl Bis, ERIC G      Created: Fri Mar 29, 2013 12:53 PM       Kidney function improved. Will continue to periodically monitor this. Please inform the patient. ------

## 2013-04-01 NOTE — Telephone Encounter (Signed)
Left a detailed message for daughter Stasia Cavalier regarding results. Rolondo Pierre,CMA

## 2013-04-01 NOTE — Telephone Encounter (Signed)
Industry notified of date and time of MRI, patient aware.

## 2013-04-01 NOTE — Telephone Encounter (Signed)
New message     MRI is scheduled for sat 6th at 6pm at [facility] on [address].  Need someone to be there to turn off loop recorder. Pls call daughter to confirm that someone will be there to turn off loop recorder.

## 2013-04-03 DIAGNOSIS — I129 Hypertensive chronic kidney disease with stage 1 through stage 4 chronic kidney disease, or unspecified chronic kidney disease: Secondary | ICD-10-CM | POA: Diagnosis not present

## 2013-04-03 DIAGNOSIS — F329 Major depressive disorder, single episode, unspecified: Secondary | ICD-10-CM | POA: Diagnosis not present

## 2013-04-03 DIAGNOSIS — M48061 Spinal stenosis, lumbar region without neurogenic claudication: Secondary | ICD-10-CM | POA: Diagnosis not present

## 2013-04-03 DIAGNOSIS — I498 Other specified cardiac arrhythmias: Secondary | ICD-10-CM | POA: Diagnosis not present

## 2013-04-03 DIAGNOSIS — I440 Atrioventricular block, first degree: Secondary | ICD-10-CM | POA: Diagnosis not present

## 2013-04-04 DIAGNOSIS — I440 Atrioventricular block, first degree: Secondary | ICD-10-CM | POA: Diagnosis not present

## 2013-04-04 DIAGNOSIS — I129 Hypertensive chronic kidney disease with stage 1 through stage 4 chronic kidney disease, or unspecified chronic kidney disease: Secondary | ICD-10-CM | POA: Diagnosis not present

## 2013-04-04 DIAGNOSIS — I498 Other specified cardiac arrhythmias: Secondary | ICD-10-CM | POA: Diagnosis not present

## 2013-04-04 DIAGNOSIS — M48061 Spinal stenosis, lumbar region without neurogenic claudication: Secondary | ICD-10-CM | POA: Diagnosis not present

## 2013-04-04 DIAGNOSIS — F329 Major depressive disorder, single episode, unspecified: Secondary | ICD-10-CM | POA: Diagnosis not present

## 2013-04-05 DIAGNOSIS — H26499 Other secondary cataract, unspecified eye: Secondary | ICD-10-CM | POA: Diagnosis not present

## 2013-04-06 ENCOUNTER — Ambulatory Visit
Admission: RE | Admit: 2013-04-06 | Discharge: 2013-04-06 | Disposition: A | Discharge status: Home / Self Care | Payer: Medicare Other | Source: Ambulatory Visit | Attending: Neurosurgery | Admitting: Neurosurgery

## 2013-04-06 DIAGNOSIS — M4120 Other idiopathic scoliosis, site unspecified: Secondary | ICD-10-CM | POA: Diagnosis not present

## 2013-04-06 DIAGNOSIS — M545 Low back pain: Secondary | ICD-10-CM

## 2013-04-06 DIAGNOSIS — M48061 Spinal stenosis, lumbar region without neurogenic claudication: Secondary | ICD-10-CM | POA: Diagnosis not present

## 2013-04-06 DIAGNOSIS — M5126 Other intervertebral disc displacement, lumbar region: Secondary | ICD-10-CM | POA: Diagnosis not present

## 2013-04-09 ENCOUNTER — Other Ambulatory Visit: Payer: Self-pay | Admitting: Family Medicine

## 2013-04-09 DIAGNOSIS — M545 Low back pain: Secondary | ICD-10-CM | POA: Diagnosis not present

## 2013-04-10 ENCOUNTER — Telehealth: Payer: Self-pay | Admitting: Family Medicine

## 2013-04-10 NOTE — Telephone Encounter (Signed)
Daughter called and needs Dr. Caryl Bis to call her with advice about her mother. The pain management doctor sent her to a neurologist Dr. Urban Gibson and he wants to do a vertebroplasty on Monday 04/15/13. She also said the they had MRI done on Saturday 12/6 on her lower back She is very concerned and wants to talk to Dr. Caryl Bis before she goes to that appointment to get his opinion on what they should do.jw

## 2013-04-12 ENCOUNTER — Ambulatory Visit (INDEPENDENT_AMBULATORY_CARE_PROVIDER_SITE_OTHER): Payer: Medicare Other | Admitting: *Deleted

## 2013-04-12 DIAGNOSIS — R55 Syncope and collapse: Secondary | ICD-10-CM

## 2013-04-12 NOTE — Telephone Encounter (Signed)
Called patients Daughter back to discuss potential vertebroplasty. Patient has been evaluated by a neurosurgeon for her back pain and they obtained an MRI that revealed a couple of compression fractures in the lumbar spine and degenerative changes. Discussed that if this minimally invasive procedure was the only procedure the surgeon wanted to perform, it may be of benefit and provide some pain relief. If they were to suggest more invasive procedure I advised that this would likely not be a good idea. I advised the patients daughter to discuss this with the surgeon on Monday. She was agreeable with this plan.

## 2013-04-15 ENCOUNTER — Encounter: Payer: Self-pay | Admitting: Internal Medicine

## 2013-04-15 DIAGNOSIS — E663 Overweight: Secondary | ICD-10-CM | POA: Diagnosis not present

## 2013-04-15 DIAGNOSIS — M545 Low back pain: Secondary | ICD-10-CM | POA: Diagnosis not present

## 2013-04-22 DIAGNOSIS — M545 Low back pain: Secondary | ICD-10-CM | POA: Diagnosis not present

## 2013-04-22 DIAGNOSIS — M67919 Unspecified disorder of synovium and tendon, unspecified shoulder: Secondary | ICD-10-CM | POA: Diagnosis not present

## 2013-05-10 ENCOUNTER — Other Ambulatory Visit: Payer: Self-pay | Admitting: Family Medicine

## 2013-05-13 ENCOUNTER — Telehealth: Payer: Self-pay | Admitting: Family Medicine

## 2013-05-13 DIAGNOSIS — M48061 Spinal stenosis, lumbar region without neurogenic claudication: Secondary | ICD-10-CM

## 2013-05-13 NOTE — Telephone Encounter (Signed)
Would like dr to authorize more home physical therapy sessions Jordan [name] she has already requested this. dont see any documentation about this Please advise

## 2013-05-14 DIAGNOSIS — M67919 Unspecified disorder of synovium and tendon, unspecified shoulder: Secondary | ICD-10-CM | POA: Diagnosis not present

## 2013-05-14 DIAGNOSIS — M25559 Pain in unspecified hip: Secondary | ICD-10-CM | POA: Diagnosis not present

## 2013-05-14 DIAGNOSIS — M719 Bursopathy, unspecified: Secondary | ICD-10-CM | POA: Diagnosis not present

## 2013-05-14 DIAGNOSIS — Z79899 Other long term (current) drug therapy: Secondary | ICD-10-CM | POA: Diagnosis not present

## 2013-05-14 DIAGNOSIS — IMO0002 Reserved for concepts with insufficient information to code with codable children: Secondary | ICD-10-CM | POA: Diagnosis not present

## 2013-05-15 ENCOUNTER — Ambulatory Visit (INDEPENDENT_AMBULATORY_CARE_PROVIDER_SITE_OTHER): Payer: Medicare Other | Admitting: *Deleted

## 2013-05-15 ENCOUNTER — Ambulatory Visit: Payer: Medicare Other | Admitting: Family Medicine

## 2013-05-15 DIAGNOSIS — R55 Syncope and collapse: Secondary | ICD-10-CM | POA: Diagnosis not present

## 2013-05-16 NOTE — Telephone Encounter (Signed)
Patient should be advised to come in for a follow-up visit to determine the need for further PT. Please advise the patient of this.

## 2013-05-16 NOTE — Telephone Encounter (Signed)
LM for Jordan [name](dgt) to return call. [name], [name]

## 2013-05-28 ENCOUNTER — Encounter: Payer: Self-pay | Admitting: Family Medicine

## 2013-05-28 ENCOUNTER — Ambulatory Visit (INDEPENDENT_AMBULATORY_CARE_PROVIDER_SITE_OTHER): Payer: Medicare Other | Admitting: Family Medicine

## 2013-05-28 VITALS — BP 180/68 | HR 88 | Temp 97.8°F | Wt 139.0 lb

## 2013-05-28 DIAGNOSIS — I10 Essential (primary) hypertension: Secondary | ICD-10-CM | POA: Diagnosis not present

## 2013-05-28 DIAGNOSIS — M75100 Unspecified rotator cuff tear or rupture of unspecified shoulder, not specified as traumatic: Secondary | ICD-10-CM

## 2013-05-28 DIAGNOSIS — M19019 Primary osteoarthritis, unspecified shoulder: Secondary | ICD-10-CM | POA: Diagnosis not present

## 2013-05-28 DIAGNOSIS — M48061 Spinal stenosis, lumbar region without neurogenic claudication: Secondary | ICD-10-CM | POA: Diagnosis not present

## 2013-05-28 DIAGNOSIS — M12819 Other specific arthropathies, not elsewhere classified, unspecified shoulder: Secondary | ICD-10-CM

## 2013-05-28 MED ORDER — MELOXICAM 7.5 MG PO TABS: 7.5000 mg | ORAL_TABLET | Freq: Every day | ORAL | Status: DC

## 2013-05-28 NOTE — Progress Notes (Signed)
Patient ID: CALLAN BEGANOVIC, female   DOB: [birth year], 80 y.o.   MRN: [id number] Subjective:   CC: Follow-up shoulder pain  HPI:   Shoulder pain: Patient presents today for follow-up of shoulder pain. Her son and daughter are present and we conduct interview partly in [place] and partly in [place] translated by them. They report that she has right-sided shoulder pain brought on with any lifting movement above 90 degrees or behind back, such as trying to brush her own hair or lift a cup to her mouth. She motions to her entire deltoid region when asked about where the pain is. She saw a pain management doctor 1.5 weeks ago who did not give steroid injection due to failure of previous injection 12/22. They wish he had, because they feel patient stated injection failed when it actually provided some (but not total) relief. She describes pain as "terrible." She takes hydrocodone which helps some but not much. Pain management doctor did not offer other medication. They offered outpatient PT (referred to [facility]) or seeing surgeon again to consider surgery. Ortho has seen her in the past, per family, and deemed her a poor surgical candidate. It has been very difficult for dtr and son to get patient to PT due to having lots of other MD appts. They are interested in home PT for 1-2 months and some other pain relief. She has tried meloxicam in the past which helped some. She has not tried heat therapy. She has iced the shoulder 2 times weekly with some relief. Denies very red shoulder, though daughter thinks it is mildly swollen and warm.  Memory - Patient also reports forgetting things and wonders about if she can take a vitamin to help her memory.  Dark marks on face and feet - Patient reports 1 month of dark marks on feet with swelling and 1.5 months of dark marks on face. She denies any swelling of face or difficulty breathing. She keeps her legs unpropped throughout the day.  Review of Systems - Per HPI.    PMH: Reviewed  Meds:  Reviewed  Objective:  Physical Exam BP 180/68  Pulse 88  Temp(Src) 97.8 F (36.6 C) (Oral)  Wt 139 lb (63.05 kg) GEN: NAD, pleasant HEENT: Atraumatic, normocephalic, neck supple, EOMI, sclera clear  PULM: normal effort SKIN: No rash or cyanosis; warm and well-perfused; some age spots/freckling on face. EXTR: No lower extremity edema or calf tenderness; right shoulder mildly warmer to touch, very subtly swollen, no erythema; unable to abduct beyond 90 degrees actively and resists it passively, tender throughout deltoid, pain with putting right arm in back pocket, empty can sign, and active and passive attempt and abduction beyond 90 degrees. PSYCH: Mood and affect euthymic, normal rate and volume of speech NEURO: Awake, alert, no focal deficits grossly, normal speech    Assessment:     DARRELYN LEGERE is a 80 y.o. female here for f/u of shoulder pain.    Plan:     # See problem list and after visit summary for problem-specific plans. - Memory: Likely some normal age-related mild memory loss. Would need further evaluation to better qualify. Re pt's question about vitamins, instructed her that none are of proven benefit but that she could ask about a specific one if she was interested in one. - Facial and foot hyperpigmentation: No breathing difficulty, subclinical duration, possibly foot swelling related to venous insufficiency and HTN. Encouraged raising legs, wearing compression socks, and asked patient to f/u with PCP.  #  Health Maintenance: Not discussed. - Needs f/u of BP.  Follow-up: Follow up in 2 weeks for f/u of shoulder pain.    Hilton Sinclair, MD [place]

## 2013-05-28 NOTE — Patient Instructions (Signed)
Good to see you today.  For your pain, I have ordered home health PT. Let us see if they will approve this at least short-term. In the meantime, take 1/2-1 tablet of meloxicam WITH MEALS at the worst time of the day for your shoulder pain. Do not take more than once daily. We are checking labs today. I will call if any are NOT normal. If normal, I will not call. Use daily massage with icy hot on the shoulder, heat therapy (hot bath or heating pad) and cool therapy (ice pack). Check with your pain clinic if you have a pain contract there. If so, they should be the only ones prescribing the vicodin.  Shoulder Pain The shoulder is the joint that connects your arm to your body. Muscles and band-like tissues that connect bones to muscles (tendons) hold the joint together. Shoulder pain is felt if an injury or medical problem affects one or more parts of the shoulder. HOME CARE   Put ice on the sore area.  Put ice in a plastic bag.  Place a towel between your skin and the bag.  Leave the ice on for 15-20 minutes, 03-04 times a day for the first 2 days.  Stop using cold packs if they do not help with the pain.  If you were given something to keep your shoulder from moving (sling, shoulder immobilizer), wear it as told. Only take it off to shower or bathe.  Move your arm as little as possible, but keep your hand moving to prevent puffiness (swelling).  Squeeze a soft ball or foam pad as much as possible to help prevent swelling.  Take medicine as told by your doctor. GET HELP RIGHT AWAY IF:   Your arm, hand, or fingers are numb or tingling.  Your arm, hand, or fingers are puffy (swollen), painful, or turn white or blue.  You have more pain.  You have progressing new pain in your arm, hand, or fingers.  Your hand or fingers get cold.  Your medicine does not help lessen your pain. MAKE SURE YOU:   Understand these instructions.  Will watch your condition.  Will get help right  away if you are not doing well or get worse. Document Released: 10/05/2007 Document Revised: 01/11/2012 Document Reviewed: 10/31/2011 [facility] Patient Information 2014 [place], [place].

## 2013-05-29 LAB — BASIC METABOLIC PANEL
BUN: 26 mg/dL — AB (ref 6–23)
CHLORIDE: 97 meq/L (ref 96–112)
CO2: 31 mEq/L (ref 19–32)
Calcium: 9.3 mg/dL (ref 8.4–10.5)
Creat: 0.94 mg/dL (ref 0.50–1.10)
Glucose, Bld: 111 mg/dL — ABNORMAL HIGH (ref 70–99)
Potassium: 4.1 mEq/L (ref 3.5–5.3)
Sodium: 137 mEq/L (ref 135–145)

## 2013-05-29 NOTE — Assessment & Plan Note (Addendum)
Pain likely from rotator cuff tendinopathy seen on [facility] [place]. H/o complete tear of right supra- and infraspinatous on MRI 10/2012. - Instructed to restart meloxicam 1/2-1 tab daily at worst time of day. - Plan to stop this/take breaks so that minimize risk of GI irritation / bleed / kidney injury. - Would continue low-dose vicodin but did not rx and asked pt to check with pain clinic if they have contract and are to be only ones filling. - PT home health 1-2 months ordered. - In meantime, heat therapy, ice, IcyHot, massage, rest. - Recheck BMET today (>>>Cr normal) and in 2-3 weeks.

## 2013-06-05 ENCOUNTER — Ambulatory Visit (INDEPENDENT_AMBULATORY_CARE_PROVIDER_SITE_OTHER): Payer: Medicare Other | Admitting: Internal Medicine

## 2013-06-05 ENCOUNTER — Telehealth: Payer: Self-pay | Admitting: Family Medicine

## 2013-06-05 ENCOUNTER — Encounter: Payer: Self-pay | Admitting: Internal Medicine

## 2013-06-05 VITALS — BP 152/57 | HR 52 | Ht <= 58 in | Wt 134.5 lb

## 2013-06-05 DIAGNOSIS — R55 Syncope and collapse: Secondary | ICD-10-CM | POA: Insufficient documentation

## 2013-06-05 DIAGNOSIS — Z4509 Encounter for adjustment and management of other cardiac device: Secondary | ICD-10-CM | POA: Diagnosis not present

## 2013-06-05 DIAGNOSIS — I6350 Cerebral infarction due to unspecified occlusion or stenosis of unspecified cerebral artery: Secondary | ICD-10-CM | POA: Diagnosis not present

## 2013-06-05 DIAGNOSIS — I639 Cerebral infarction, unspecified: Secondary | ICD-10-CM

## 2013-06-05 LAB — MDC_IDC_ENUM_SESS_TYPE_INCLINIC
Date Time Interrogation Session: 20150204170536
MDC IDC SET ZONE DETECTION INTERVAL: 2000 ms
MDC IDC SET ZONE DETECTION INTERVAL: 3000 ms
Zone Setting Detection Interval: 430 ms

## 2013-06-05 NOTE — Telephone Encounter (Signed)
I will do the cognitive exam when she comes to her visit on the 11th. I will then fill out the form for home care.

## 2013-06-05 NOTE — Assessment & Plan Note (Signed)
She has had no recurrent episodes. She does have fatigue but no arrhythmias to correlate her symptoms. Will undergo watchful waiting.

## 2013-06-05 NOTE — Telephone Encounter (Signed)
Daughter called concerning her mother Jordan [name]. She would like someone to call her. jw

## 2013-06-05 NOTE — Progress Notes (Signed)
HPI Jordan [name] presents today for additional followup of her ILR in the setting of remote, unexplained syncope. She had an episode of atrial tachy/fibrillation several weeks ago which on questioning appears to be asymptomatic. She notes fatigue. No syncope. She has arthritic complaints mostly in the shoulder.   Allergies  Allergen Reactions  . Honey Bee Treatment [Bee Venom]      Current Outpatient Prescriptions  Medication Sig Dispense Refill  . alendronate (FOSAMAX) 70 MG tablet take 1 tablet by mouth every week on an empty stomach with 6-8 oz of water. No food / meds for 30 min. Remain Upright  4 tablet  3  . aspirin 325 MG EC tablet Take 81 mg by mouth daily.      . Calcium Carbonate-Vitamin D (CALTRATE 600+D) 600-400 MG-UNIT per tablet Take 1 tablet by mouth 2 (two) times daily.        . diclofenac sodium (VOLTAREN) 1 % GEL Apply 2 g topically 4 (four) times daily.  10 Tube  1  . docusate sodium (COLACE) 100 MG capsule Take 2 capsules (200 mg total) by mouth every 12 (twelve) hours.  60 capsule  0  . Glucosamine-Chondroitin-Vit D3 1500-1200-800 MG-MG-UNIT PACK Take 1 tablet by mouth 2 (two) times daily.        Marland Kitchen HYDROcodone-acetaminophen (NORCO/VICODIN) 5-325 MG per tablet Take 1 tablet by mouth every 4 (four) hours as needed for pain.  30 tablet  0  . levothyroxine (SYNTHROID, LEVOTHROID) 112 MCG tablet take 1 tablet by mouth once daily  90 tablet  1  . LIDODERM 5 % APPLY 1 PATCH TO SKIN DAILY. MAY CUT PATCH AND USE 1/4 OR 1/2 OF PATCH INSTEAD  90 each  1  . meloxicam (MOBIC) 7.5 MG tablet Take 1 tablet (7.5 mg total) by mouth daily.  30 tablet  0  . mirtazapine (REMERON) 45 MG tablet take 1 tablet by mouth at bedtime  90 tablet  0  . polyethylene glycol (MIRALAX) packet Take 17 g by mouth daily.  14 each  0  . RESTASIS 0.05 % ophthalmic emulsion       . sertraline (ZOLOFT) 50 MG tablet take 1 and 1/2 tablets by mouth once daily  45 tablet  3  . TRAVATAN Z 0.004 % SOLN  ophthalmic solution       . traZODone (DESYREL) 50 MG tablet take 1/2 or 1 tablet by mouth at bedtime if needed for sleep  30 tablet  2  . trospium (SANCTURA) 20 MG tablet Take 20 mg by mouth daily.        No current facility-administered medications for this visit.     Past Medical History  Diagnosis Date  . Lung mass     right apical need CT in end of 2012  . Cervicalgia   . Peripheral neuropathy   . Frailty   . Hypertension   . Hypothyroid   . Osteoporosis   . Insomnia   . Arthritis     ROS:   All systems reviewed and negative except as noted in the HPI.   Past Surgical History  Procedure Laterality Date  . Abdominal hysterectomy       Family History  Problem Relation Age of Onset  . Kidney disease Mother      History   Social History  . Marital Status: Widowed    Spouse Name: N/A    Number of Children: N/A  . Years of Education: N/A   Occupational  History  . Not on file.   Social History Main Topics  . Smoking status: Never Smoker   . Smokeless tobacco: Never Used  . Alcohol Use: No  . Drug Use: No  . Sexual Activity: Not on file   Other Topics Concern  . Not on file   Social History Narrative   lives with son, [place]; No tob, etoh.    Has three total children. 1 g'child.;    Dtr [name] (w) [phone], (h) [phone]; dtr [place]. Daughters usually interpret for her at visits and are very involved and ask lots of questions.      BP 152/57  Pulse 52  Ht 4\' 9"  (1.448 m)  Wt 134 lb 8 oz (61.009 kg)  BMI 29.10 kg/m2  Physical Exam:  Well appearing elderly woman, NAD HEENT: Unremarkable Neck:  No JVD, no thyromegally Lymphatics:  No adenopathy Back:  No CVA tenderness Lungs:  Clear with no wheezes HEART:  Regular rate rhythm, no murmurs, no rubs, no clicks Abd:  soft, positive bowel sounds, no organomegally, no rebound, no guarding Ext:  2 plus pulses, no edema, no cyanosis, no clubbing Skin:  No rashes no nodules Neuro:  CN II  through XII intact, motor grossly intact   DEVICE  Normal device function.  See PaceArt for details. ILR demonstrates a single episode of atrial fib/tachy  Assess/Plan:

## 2013-06-05 NOTE — Patient Instructions (Signed)
Your physician recommends that you continue on your current medications as directed. Please refer to the Current Medication list given to you today.  Your physician wants you to follow-up in: 6 months with Dr. Lovena Le.  You will receive a reminder letter in the mail two months in advance. If you don't receive a letter, please call our office to schedule the follow-up appointment.

## 2013-06-05 NOTE — Telephone Encounter (Signed)
Daughter Stasia Cavalier will be dropping off a form for increased home care assistance at home.  She would like that you include her diagnosis of memory loss and how this is getting worse.   Would like to know if you think she needs a cognitive exam or see Vinnie Level for her memory?  She has an appt with Dr. Caryl Bis on 06/12/13.  Sending message to both PCP and Dr. Dianah Field since last saw pt.  Johnney Ou

## 2013-06-06 NOTE — Telephone Encounter (Signed)
The form was put in my box but I am putting it in Dr Ellen Henri box today.  Hilton Sinclair, MD

## 2013-06-07 ENCOUNTER — Telehealth: Payer: Self-pay | Admitting: Family Medicine

## 2013-06-07 NOTE — Telephone Encounter (Signed)
Daughter called and would like Dr. Caryl Bis or his nurse to call her. She has some questions concerning her mother.jw

## 2013-06-10 NOTE — Telephone Encounter (Signed)
Spoke with daughter Stanton Kidney lou) her moms BP was 186/86 on Friday and she was having shortness of breath.  They have "kept an eye on it all weekend" and now it was 145/80 this AM.  They have appt on Wednesday, advised to keep a log and bring it on Wednesday.   Explained that many factors could affect BP (pain, no sleep, exertion: all of which pt had that day).  [name] expressed understanding to bring log and to take to ED immediately of pt has CP, nausea, vomiting or diaphoresis with high BP.   [name], [name]

## 2013-06-12 ENCOUNTER — Encounter: Payer: Self-pay | Admitting: Family Medicine

## 2013-06-12 ENCOUNTER — Ambulatory Visit (INDEPENDENT_AMBULATORY_CARE_PROVIDER_SITE_OTHER): Payer: Medicare Other | Admitting: Family Medicine

## 2013-06-12 VITALS — BP 170/70 | HR 83 | Temp 97.9°F | Ht <= 58 in | Wt 137.0 lb

## 2013-06-12 DIAGNOSIS — I10 Essential (primary) hypertension: Secondary | ICD-10-CM | POA: Diagnosis not present

## 2013-06-12 DIAGNOSIS — R413 Other amnesia: Secondary | ICD-10-CM

## 2013-06-12 DIAGNOSIS — M19019 Primary osteoarthritis, unspecified shoulder: Secondary | ICD-10-CM

## 2013-06-12 DIAGNOSIS — M12819 Other specific arthropathies, not elsewhere classified, unspecified shoulder: Secondary | ICD-10-CM

## 2013-06-12 DIAGNOSIS — M75100 Unspecified rotator cuff tear or rupture of unspecified shoulder, not specified as traumatic: Secondary | ICD-10-CM

## 2013-06-12 DIAGNOSIS — G4700 Insomnia, unspecified: Secondary | ICD-10-CM

## 2013-06-12 MED ORDER — SERTRALINE HCL 50 MG PO TABS: ORAL_TABLET | ORAL | Status: DC

## 2013-06-12 MED ORDER — MIRTAZAPINE 30 MG PO TABS: 30.0000 mg | ORAL_TABLET | Freq: Every day | ORAL | Status: DC

## 2013-06-12 NOTE — Patient Instructions (Addendum)
Please take the new dosing of mirtazepine and sertraline. I will see you back in one month to step these back again.  Physical therapy will call to set up home physical therapy. Please follow-up with the surgeon for your shoulder.

## 2013-06-14 ENCOUNTER — Telehealth: Payer: Self-pay | Admitting: Family Medicine

## 2013-06-14 NOTE — Telephone Encounter (Signed)
Would like to speak nurse/MD about forms left with MD on 06/12/13. Please call [phone]

## 2013-06-14 NOTE — Telephone Encounter (Signed)
Daughter Stasia Cavalier is wanting to know that status of paperwork.  She is really concerned with her mom being home alone and really wants to get more help quickly. [name],CMA

## 2013-06-15 DIAGNOSIS — R413 Other amnesia: Secondary | ICD-10-CM | POA: Insufficient documentation

## 2013-06-15 MED ORDER — AMLODIPINE BESYLATE 2.5 MG PO TABS: 2.5000 mg | ORAL_TABLET | Freq: Every day | ORAL | Status: DC

## 2013-06-15 NOTE — Progress Notes (Signed)
Patient ID: Jordan [name], female   DOB: Jun 02, 1919, 80 y.o.   MRN: [id number]  [name], MD Phone: [phone]  Jordan [name] is a 80 y.o. female who presents today for f/u.  Patient comes in with a page long list of items to discuss. The first item is concerns regarding sleep at night. The patient has been on remeron for this as well as on sertraline for depression for some time. She notes sleeping only 4-5 hours per night though when asked about when she goes to bed the patient notes she gets in bed around 10:30 pm, falls asleep by 11:30 pm, wakes up to go to the bathroom 1-2 times per night and gets up in the morning at 7 am. She has previously been on trazodone for sleep, though does not remember how effective this was.  The second item to discuss is her pain in her shoulder and back. She has been seen by [facility] and by the pain clinic for this. She has been referred to an orthopedic surgeon as well. She has received injections in her shoulder that were felt to be of minimal benefit. She has been on vicodin and meloxicam for this.  Both of which were of minimal benefit. The patients family states that the pain clinic has referred her back to the ortho surgeon for evaluation for potential surgery. They met with the surgeon previously and he determined that surgery was not a good option. They wanted my opinion on whether to f/u with the surgeon as well as wanting an additional order for more PT at home.  They are also concerned regarding the patients ability to be home alone. She has become more forgetful recently and they feel she needs additional help at home. They note she recently forgot to turn the stove off. At this time she is receiving 80 hours of help per month.  Additionally we discussed the patients blood pressure. They note that at home this is sometimes elevated to the 180's/80's, though also report the often it is in the 140's/60's. Recently it has been elevated in clinic.   The  following portions of the patient's history were reviewed and updated as appropriate: allergies, current medications, past medical history, family and social history, and problem list.  Patient is a nonsmoker.  Past Medical History  Diagnosis Date  . Lung mass     right apical need CT in end of 2012  . Cervicalgia   . Peripheral neuropathy   . Frailty   . Hypertension   . Hypothyroid   . Osteoporosis   . Insomnia   . Arthritis     History  Smoking status  . Never Smoker   Smokeless tobacco  . Never Used    Family History  Problem Relation Age of Onset  . Kidney disease Mother     Current Outpatient Prescriptions on File Prior to Visit  Medication Sig Dispense Refill  . alendronate (FOSAMAX) 70 MG tablet take 1 tablet by mouth every week on an empty stomach with 6-8 oz of water. No food / meds for 30 min. Remain Upright  4 tablet  3  . aspirin 325 MG EC tablet Take 81 mg by mouth daily.      . Calcium Carbonate-Vitamin D (CALTRATE 600+D) 600-400 MG-UNIT per tablet Take 1 tablet by mouth 2 (two) times daily.        . diclofenac sodium (VOLTAREN) 1 % GEL Apply 2 g topically 4 (four) times daily.  10  Tube  1  . docusate sodium (COLACE) 100 MG capsule Take 2 capsules (200 mg total) by mouth every 12 (twelve) hours.  60 capsule  0  . Glucosamine-Chondroitin-Vit D3 1500-1200-800 MG-MG-UNIT PACK Take 1 tablet by mouth 2 (two) times daily.        Marland Kitchen HYDROcodone-acetaminophen (NORCO/VICODIN) 5-325 MG per tablet Take 1 tablet by mouth every 4 (four) hours as needed for pain.  30 tablet  0  . levothyroxine (SYNTHROID, LEVOTHROID) 112 MCG tablet take 1 tablet by mouth once daily  90 tablet  1  . LIDODERM 5 % APPLY 1 PATCH TO SKIN DAILY. MAY CUT PATCH AND USE 1/4 OR 1/2 OF PATCH INSTEAD  90 each  1  . meloxicam (MOBIC) 7.5 MG tablet Take 1 tablet (7.5 mg total) by mouth daily.  30 tablet  0  . polyethylene glycol (MIRALAX) packet Take 17 g by mouth daily.  14 each  0  . RESTASIS 0.05 %  ophthalmic emulsion       . TRAVATAN Z 0.004 % SOLN ophthalmic solution       . traZODone (DESYREL) 50 MG tablet take 1/2 or 1 tablet by mouth at bedtime if needed for sleep  30 tablet  2  . trospium (SANCTURA) 20 MG tablet Take 20 mg by mouth daily.        No current facility-administered medications on file prior to visit.    ROS: Per HPI   Physical Exam Filed Vitals:   06/12/13 1733  BP: 170/70  Pulse:   Temp:     Physical Examination: General appearance - alert, well appearing, and in no distress Mental status - normal mood, behavior, speech, dress, motor activity, and thought processes   Assessment/Plan: Please see individual problem list.  I have spent >25 minutes in the care of this patient with >50% spent in counseling/coordination of care regarding sleep issues, pain, memory deficit, and HTN.

## 2013-06-15 NOTE — Assessment & Plan Note (Signed)
Patient with continued elevation in systolic blood pressure. Will start on amlodipine 5 mg daily. F/u in one month for this issue.

## 2013-06-15 NOTE — Assessment & Plan Note (Signed)
Patient with continued pain from rotator cuff tendinopathy. Meloxicam and vicodin have been minimally helpful. Advised that the patient should f/u with the ortho surgeon as the pain clinic directed. I do not feel as though she would be a good surgical candidate given her age and length of time needed to recover, though this may be the only option to help reduce her pain. Advised to contact the pain clinic following appointment with the surgeon for further pain management. Could consider tramadol for pain once off remeron and sertraline. Will refer back to PT as well.

## 2013-06-15 NOTE — Assessment & Plan Note (Signed)
Patient with continued difficulties sleeping. Discussed that as people get older they sleep less and this is common occurrence. Patient has been on remeron for this currently. Will plan on titrating off remeron and sertraline with goal of trying trazodone in the future. It may be that the patient may not sleep any better than she currently is.

## 2013-06-15 NOTE — Assessment & Plan Note (Signed)
Will need to do MMSE at next visit. Forms filled out for additional help at home.

## 2013-06-17 ENCOUNTER — Ambulatory Visit (INDEPENDENT_AMBULATORY_CARE_PROVIDER_SITE_OTHER): Payer: Medicare Other | Admitting: *Deleted

## 2013-06-17 ENCOUNTER — Telehealth: Payer: Self-pay | Admitting: *Deleted

## 2013-06-17 DIAGNOSIS — R55 Syncope and collapse: Secondary | ICD-10-CM

## 2013-06-17 NOTE — Telephone Encounter (Signed)
Message copied by Bobbye Riggs on Mon Jun 17, 2013 10:20 AM ------      Message from: Caryl Bis, ERIC G      Created: Sat Jun 15, 2013  1:23 PM       Please advise patients family that we will start a low dose of medication called amlodipine for her blood pressure. Ask that they check her blood pressure daily and if this becomes consistently less than 140/80 they should be advised to hold the medication. They should also hold the medication if she becomes light headed or dizzy. Advise them to follow-up in 2-3 weeks. Also the form they requested will be waiting for them at the front desk on Monday. Thanks.  ------

## 2013-06-17 NOTE — Telephone Encounter (Signed)
LMOVM for Jordan [name] (dgt) to call back. Hollis Tuller, [name]

## 2013-06-19 ENCOUNTER — Telehealth: Payer: Self-pay | Admitting: Family Medicine

## 2013-06-19 ENCOUNTER — Other Ambulatory Visit: Payer: Self-pay | Admitting: Family Medicine

## 2013-06-19 NOTE — Telephone Encounter (Signed)
Paperwork filled out and given to [employer].

## 2013-06-19 NOTE — Telephone Encounter (Signed)
LMOVM for pt to return call .Kinsey Cowsert Dawn  

## 2013-06-19 NOTE — Telephone Encounter (Signed)
Needs refill on lioderm

## 2013-06-19 NOTE — Telephone Encounter (Signed)
LMOVM asking dgt to return call. [name], [name]

## 2013-06-21 MED ORDER — LIDOCAINE 5 % EX PTCH: MEDICATED_PATCH | CUTANEOUS | Status: DC

## 2013-06-21 NOTE — Telephone Encounter (Signed)
Refill sent in for lidoderm patch. Please inform patient.

## 2013-06-21 NOTE — Telephone Encounter (Signed)
Spoke with daughter Stasia Cavalier and she is aware of this.  Also an appt was made for pt to follow up in 2 weeks.  Beldon Nowling,CMA

## 2013-06-21 NOTE — Telephone Encounter (Signed)
LMOVM asking for return call again. Jordan [name], Jordan [name]

## 2013-06-25 ENCOUNTER — Telehealth: Payer: Self-pay | Admitting: *Deleted

## 2013-06-25 DIAGNOSIS — M75100 Unspecified rotator cuff tear or rupture of unspecified shoulder, not specified as traumatic: Principal | ICD-10-CM

## 2013-06-25 DIAGNOSIS — M12819 Other specific arthropathies, not elsewhere classified, unspecified shoulder: Secondary | ICD-10-CM

## 2013-06-25 LAB — MDC_IDC_ENUM_SESS_TYPE_REMOTE

## 2013-06-25 NOTE — Telephone Encounter (Signed)
Received call from pt's daughter [facility] regarding referral for home physical therapy.  She wanted to know if the referral was approved or not.  Please give her a call at [phone].  Derl Barrow, RN

## 2013-06-25 NOTE — Telephone Encounter (Signed)
Spoke with [facility] and she would like this home physical therapy to be through [place].  Pt has seen someone before named Cecile who has come out and done therapy for her years ago.  Can you please send this over to them?  Lanie Schelling,CMA

## 2013-06-26 DIAGNOSIS — M19019 Primary osteoarthritis, unspecified shoulder: Secondary | ICD-10-CM | POA: Diagnosis not present

## 2013-06-26 NOTE — Telephone Encounter (Signed)
I have redone the referral. I can not promise that this gets done through the correct people, though I have made the request.

## 2013-06-26 NOTE — Telephone Encounter (Signed)
Pt's pcp did some changes in referral and everything is documented in referral notes.  [facility] will contact pt. Change in referral  was  suggested by [place].  [place]

## 2013-06-27 LAB — MDC_IDC_ENUM_SESS_TYPE_REMOTE

## 2013-06-28 MED ORDER — AMLODIPINE BESYLATE 2.5 MG PO TABS: 5.0000 mg | ORAL_TABLET | Freq: Every day | ORAL | Status: DC

## 2013-06-28 NOTE — Telephone Encounter (Signed)
Daughter called and would like Dr. Caryl Bis to call her or one of the nurse's. Her mothers BP was 206/86 yesterday and 186/86 today. jw

## 2013-06-28 NOTE — Telephone Encounter (Signed)
Pt has an appt on 3-9 with you.  What would you like [place] to tell [name]?  [name],CMA

## 2013-06-28 NOTE — Telephone Encounter (Signed)
Spoke with [employer] and gave her your new instructions.  She states that patient's bp today was right after taking her medication.  They rechecked it about an hour after and it was down to 147/86.  Informed her that she can start the new regimen tomorrow and then check her bp about an hour after taking it to see how it is.  [name] is fine with this.  Gave her warnings to look out for over the weekend and to have call us on Monday with new readings.  Timo Hartwig,CMA

## 2013-06-28 NOTE — Telephone Encounter (Signed)
Please advise that we will increase amlodipine to 5 mg daily. I sent in a new prescription for this. She should be advised that if Jordan [name] develops chest pain, shortness of breath, vision changes, or decreased urine output, or if she in general starts to feel worse, she should come in to be seen whether in clinic or urgent care or the ED. Thanks.

## 2013-07-02 DIAGNOSIS — S43429A Sprain of unspecified rotator cuff capsule, initial encounter: Secondary | ICD-10-CM | POA: Diagnosis not present

## 2013-07-02 DIAGNOSIS — I10 Essential (primary) hypertension: Secondary | ICD-10-CM | POA: Diagnosis not present

## 2013-07-02 DIAGNOSIS — G609 Hereditary and idiopathic neuropathy, unspecified: Secondary | ICD-10-CM | POA: Diagnosis not present

## 2013-07-02 DIAGNOSIS — IMO0001 Reserved for inherently not codable concepts without codable children: Secondary | ICD-10-CM | POA: Diagnosis not present

## 2013-07-02 DIAGNOSIS — M4800 Spinal stenosis, site unspecified: Secondary | ICD-10-CM | POA: Diagnosis not present

## 2013-07-02 DIAGNOSIS — G8929 Other chronic pain: Secondary | ICD-10-CM | POA: Diagnosis not present

## 2013-07-02 DIAGNOSIS — M1990 Unspecified osteoarthritis, unspecified site: Secondary | ICD-10-CM | POA: Diagnosis not present

## 2013-07-03 ENCOUNTER — Telehealth: Payer: Self-pay | Admitting: Family Medicine

## 2013-07-03 NOTE — Telephone Encounter (Signed)
Spoke with [name].  The following was discussed:  1.  Pt's orthopaedist wants to do surgery on her shoulder and they have some forms that needs to be filled out.  She will bring them to appt.  [name] advised that forms could possibly not be ready the same day, dgt agreeable.   2.  The request for extended home health nursing was denied basically because of the dx code.   Per [name] the dx code of Memory loss is not specific enough and could be used to describe a condition that can be reversible.  They would need another Dx code and more detailed info other than memory loss and should pain.  [name] would like it mentioned that she has many pills and forgets to take them, she will forget what day it is etc.  She will fax over the form now and will go into more detail at appt on Monday. Deaisha Welborn, [name]

## 2013-07-03 NOTE — Telephone Encounter (Signed)
Daughter called and would like the nurse or dr. Caryl Bis to call her to discuss some things about her mother. Ladon has an appointment on 3/9 but needs to talk the someone before her mothers appointment on 3/9. jw

## 2013-07-04 DIAGNOSIS — IMO0001 Reserved for inherently not codable concepts without codable children: Secondary | ICD-10-CM | POA: Diagnosis not present

## 2013-07-04 DIAGNOSIS — S43429A Sprain of unspecified rotator cuff capsule, initial encounter: Secondary | ICD-10-CM | POA: Diagnosis not present

## 2013-07-04 DIAGNOSIS — G609 Hereditary and idiopathic neuropathy, unspecified: Secondary | ICD-10-CM | POA: Diagnosis not present

## 2013-07-04 DIAGNOSIS — M1990 Unspecified osteoarthritis, unspecified site: Secondary | ICD-10-CM | POA: Diagnosis not present

## 2013-07-04 DIAGNOSIS — M4800 Spinal stenosis, site unspecified: Secondary | ICD-10-CM | POA: Diagnosis not present

## 2013-07-04 DIAGNOSIS — I10 Essential (primary) hypertension: Secondary | ICD-10-CM | POA: Diagnosis not present

## 2013-07-05 ENCOUNTER — Telehealth: Payer: Self-pay | Admitting: Family Medicine

## 2013-07-05 DIAGNOSIS — M4800 Spinal stenosis, site unspecified: Secondary | ICD-10-CM | POA: Diagnosis not present

## 2013-07-05 DIAGNOSIS — IMO0001 Reserved for inherently not codable concepts without codable children: Secondary | ICD-10-CM | POA: Diagnosis not present

## 2013-07-05 DIAGNOSIS — G609 Hereditary and idiopathic neuropathy, unspecified: Secondary | ICD-10-CM | POA: Diagnosis not present

## 2013-07-05 DIAGNOSIS — I10 Essential (primary) hypertension: Secondary | ICD-10-CM | POA: Diagnosis not present

## 2013-07-05 DIAGNOSIS — M1990 Unspecified osteoarthritis, unspecified site: Secondary | ICD-10-CM | POA: Diagnosis not present

## 2013-07-05 DIAGNOSIS — S43429A Sprain of unspecified rotator cuff capsule, initial encounter: Secondary | ICD-10-CM | POA: Diagnosis not present

## 2013-07-05 NOTE — Telephone Encounter (Signed)
Daughter called and wanted to verify that Jordan [name] had received the fax from her the other day. If so please call and let her know if you didn't get it or text her at [phone]. jw

## 2013-07-05 NOTE — Telephone Encounter (Signed)
[name] informed that I received it. [name], [name]

## 2013-07-06 NOTE — Telephone Encounter (Signed)
Will discuss these issues on Monday with the patient and her family. Will need MMSE for diagnosis of dementia.

## 2013-07-08 ENCOUNTER — Encounter: Payer: Self-pay | Admitting: Family Medicine

## 2013-07-08 ENCOUNTER — Encounter: Payer: Self-pay | Admitting: Internal Medicine

## 2013-07-08 ENCOUNTER — Ambulatory Visit (INDEPENDENT_AMBULATORY_CARE_PROVIDER_SITE_OTHER): Payer: Medicare Other | Admitting: Family Medicine

## 2013-07-08 VITALS — BP 172/69 | HR 79 | Temp 98.2°F | Wt 138.0 lb

## 2013-07-08 DIAGNOSIS — F0390 Unspecified dementia without behavioral disturbance: Secondary | ICD-10-CM | POA: Diagnosis not present

## 2013-07-08 DIAGNOSIS — F03B0 Unspecified dementia, moderate, without behavioral disturbance, psychotic disturbance, mood disturbance, and anxiety: Secondary | ICD-10-CM | POA: Insufficient documentation

## 2013-07-08 DIAGNOSIS — Z7189 Other specified counseling: Secondary | ICD-10-CM | POA: Diagnosis not present

## 2013-07-08 DIAGNOSIS — I10 Essential (primary) hypertension: Secondary | ICD-10-CM

## 2013-07-08 MED ORDER — LIDOCAINE 5 % EX PTCH: MEDICATED_PATCH | CUTANEOUS | Status: DC

## 2013-07-08 NOTE — Assessment & Plan Note (Signed)
Patient with likely alzheimers dementia. Previous B12 normal. She is adequately treated for hypothyroidism. At this time I do not believe any neuroimaging would change our treatment course. Will continue to provide supportive care as she is past the point of benefit from aricept and namenda. Forms filled out for additional assistance at home.

## 2013-07-08 NOTE — Assessment & Plan Note (Signed)
Patient with labile BPs reported at home anywhere from Q000111Q systolic. Mostly in the 170s per daughter report. Goal systolic would be around 0000000, though given age and history of syncope, would be hesitant to drop her too low. Will continue current treatment and continue to monitor.

## 2013-07-08 NOTE — Assessment & Plan Note (Signed)
Patient presented to discuss clearance for surgery. Given that the surgical procedure is one on minimal cardiac risk and the patient has low risk for cardiac event during surgery she will be given clearance to proceed. The patient has a recent history of difficult to control pain in her right shoulder related to a rotator cuff tear. At this point in the progression of this disease I feel that a surgical approach may be the only beneficial intervention and may serve as a palliative measure in relief of her pain. I had a long discussion regarding advance directives with her son and daughter and they stated they needed more time to discuss the advance directive aspect. Given that the patient has been found to have moderate dementia she is not capable of making her own decisions, so it will fall on to her children to make this decision for her.

## 2013-07-08 NOTE — Patient Instructions (Signed)
We will have your forms filled out and you will be called in the next several days to pick them up.

## 2013-07-08 NOTE — Progress Notes (Signed)
Patient ID: Jordan [name], female   DOB: Dec 27, 1919, 80 y.o.   MRN: [id number]  [name], MD Phone: [phone]  Jordan [name] is a 80 y.o. female who presents today for for dementia evaluation and for surgical clearance.  Dementia: patients daughter and brother both note she has been increasingly forgetful. They note she has left the stove on and forgotten about it. She has become withdrawn and less talkative. They state they need paper work filled out documenting her memory loss to obtain increased help at home.  Surgical clearance: the patient is to have shoulder arthroscopy in the near future. Family members deny history of MI or ischemic event. She is not having chest pain at the time of the evaluation. She has a history of grade one diastolic HF. No history of cerebrovascular disease. No history of DM. Most recent Cr is <2.  HYPERTENSION Disease Monitoring Home BP Monitoring checking, typically Q000111Q systolic. Chest pain- no    Dyspnea- no Medications Compliance-  taking.  Edema- no  The following portions of the patient's history were reviewed and updated as appropriate: allergies, current medications, past medical history, family and social history, and problem list.  Patient is a nonsmoker.  Past Medical History  Diagnosis Date  . Lung mass     right apical need CT in end of 2012  . Cervicalgia   . Peripheral neuropathy   . Frailty   . Hypertension   . Hypothyroid   . Osteoporosis   . Insomnia   . Arthritis     History  Smoking status  . Never Smoker   Smokeless tobacco  . Never Used    Family History  Problem Relation Age of Onset  . Kidney disease Mother     Current Outpatient Prescriptions on File Prior to Visit  Medication Sig Dispense Refill  . amLODipine (NORVASC) 2.5 MG tablet Take 2 tablets (5 mg total) by mouth daily.  60 tablet  1  . aspirin 325 MG EC tablet Take 81 mg by mouth daily.      . Calcium Carbonate-Vitamin D (CALTRATE 600+D) 600-400  MG-UNIT per tablet Take 1 tablet by mouth 2 (two) times daily.        . diclofenac sodium (VOLTAREN) 1 % GEL Apply 2 g topically 4 (four) times daily.  10 Tube  1  . docusate sodium (COLACE) 100 MG capsule Take 2 capsules (200 mg total) by mouth every 12 (twelve) hours.  60 capsule  0  . Glucosamine-Chondroitin-Vit D3 1500-1200-800 MG-MG-UNIT PACK Take 1 tablet by mouth 2 (two) times daily.        Marland Kitchen HYDROcodone-acetaminophen (NORCO/VICODIN) 5-325 MG per tablet Take 1 tablet by mouth every 4 (four) hours as needed for pain.  30 tablet  0  . levothyroxine (SYNTHROID, LEVOTHROID) 112 MCG tablet take 1 tablet by mouth once daily  90 tablet  0  . meloxicam (MOBIC) 7.5 MG tablet Take 1 tablet (7.5 mg total) by mouth daily.  30 tablet  0  . mirtazapine (REMERON) 30 MG tablet Take 1 tablet (30 mg total) by mouth at bedtime.  30 tablet  1  . polyethylene glycol (MIRALAX) packet Take 17 g by mouth daily.  14 each  0  . RESTASIS 0.05 % ophthalmic emulsion       . sertraline (ZOLOFT) 50 MG tablet take 1 tablet by mouth once daily  45 tablet  3  . TRAVATAN Z 0.004 % SOLN ophthalmic solution       .  trospium (SANCTURA) 20 MG tablet Take 20 mg by mouth daily.        No current facility-administered medications on file prior to visit.    ROS: Per HPI   Physical Exam Filed Vitals:   07/08/13 1608  BP: 172/69  Pulse: 79  Temp: 98.2 F (36.8 C)    Physical Examination: General appearance - alert, well appearing, and in no distress Mental status - MMSE 15/29 Chest - clear to auscultation, no wheezes, rales or rhonchi, symmetric air entry Heart - normal rate, regular rhythm, normal S1, S2, no murmurs, rubs, clicks or gallops Extremities - no pedal edema noted   Assessment/Plan: Please see individual problem list.  I have spent >25 minutes in the care of this patient with >50% spent in counseling/coordination of care regarding dementia, surgical clearance, and HTN.

## 2013-07-09 ENCOUNTER — Telehealth: Payer: Self-pay | Admitting: *Deleted

## 2013-07-09 DIAGNOSIS — IMO0001 Reserved for inherently not codable concepts without codable children: Secondary | ICD-10-CM | POA: Diagnosis not present

## 2013-07-09 DIAGNOSIS — M4800 Spinal stenosis, site unspecified: Secondary | ICD-10-CM | POA: Diagnosis not present

## 2013-07-09 DIAGNOSIS — M1990 Unspecified osteoarthritis, unspecified site: Secondary | ICD-10-CM | POA: Diagnosis not present

## 2013-07-09 DIAGNOSIS — I10 Essential (primary) hypertension: Secondary | ICD-10-CM | POA: Diagnosis not present

## 2013-07-09 DIAGNOSIS — G609 Hereditary and idiopathic neuropathy, unspecified: Secondary | ICD-10-CM | POA: Diagnosis not present

## 2013-07-09 DIAGNOSIS — S43429A Sprain of unspecified rotator cuff capsule, initial encounter: Secondary | ICD-10-CM | POA: Diagnosis not present

## 2013-07-09 NOTE — Telephone Encounter (Signed)
Prior Authorization received from [facility] for Lidocaine 5% patch.  PA form placed in provider box for completion. Derl Barrow, RN

## 2013-07-09 NOTE — Telephone Encounter (Signed)
LMOVM of [facility].  I faxed the form to [facility] and to [employer]  Also I left copies up front for pick up. Michon Kaczmarek, [name]

## 2013-07-10 ENCOUNTER — Telehealth: Payer: Self-pay | Admitting: Internal Medicine

## 2013-07-10 NOTE — Telephone Encounter (Signed)
  Spoke with daughter and let her know I received fax and Dr Lovena Le will sign on Tues.  She can call

## 2013-07-10 NOTE — Telephone Encounter (Signed)
New message         Pt daughter would like to know if you received paperwork that was faxed yesterday for dr taylor to fill out.

## 2013-07-11 DIAGNOSIS — IMO0001 Reserved for inherently not codable concepts without codable children: Secondary | ICD-10-CM | POA: Diagnosis not present

## 2013-07-11 DIAGNOSIS — I10 Essential (primary) hypertension: Secondary | ICD-10-CM | POA: Diagnosis not present

## 2013-07-11 DIAGNOSIS — G609 Hereditary and idiopathic neuropathy, unspecified: Secondary | ICD-10-CM | POA: Diagnosis not present

## 2013-07-11 DIAGNOSIS — M4800 Spinal stenosis, site unspecified: Secondary | ICD-10-CM | POA: Diagnosis not present

## 2013-07-11 DIAGNOSIS — S43429A Sprain of unspecified rotator cuff capsule, initial encounter: Secondary | ICD-10-CM | POA: Diagnosis not present

## 2013-07-11 DIAGNOSIS — M1990 Unspecified osteoarthritis, unspecified site: Secondary | ICD-10-CM | POA: Diagnosis not present

## 2013-07-12 ENCOUNTER — Telehealth: Payer: Self-pay | Admitting: Family Medicine

## 2013-07-12 NOTE — Telephone Encounter (Signed)
Daughter called and would like someone to call her concerning her mother. Jordan [name]

## 2013-07-12 NOTE — Telephone Encounter (Signed)
Spoke with daughter Stasia Cavalier and she would like somethings changed on form to increase home care.  She needs Dr. Caryl Bis to initial the last line on the first page of step 4 where it talks about history of safety concerns. Also on pg 2 the last block needs to say something like "beneficiary needs more hands on assistance/more service hours at home.  Will look for form in filing cabinet to put back in providers box. [name],CMA

## 2013-07-15 NOTE — Telephone Encounter (Signed)
Filled out and returned to [employer].

## 2013-07-15 NOTE — Telephone Encounter (Signed)
PA for Lidocaine 5% patch faxed to [place] for review. Derl Barrow, RN

## 2013-07-15 NOTE — Telephone Encounter (Signed)
Received another PA for Lidocaine 5% patch. Placed in provider box.  Derl Barrow, RN

## 2013-07-15 NOTE — Telephone Encounter (Signed)
Form found and will place in Dr. Ellen Henri box.  Highlighted areas to be finished.  Ade Stmarie,CMA

## 2013-07-15 NOTE — Telephone Encounter (Signed)
Form completed and returned to [employer].

## 2013-07-15 NOTE — Telephone Encounter (Signed)
At daughter's request will place a copy of forms up front for her to pick up. [name],CMA

## 2013-07-15 NOTE — Telephone Encounter (Signed)
Refaxed forms to [employer].  [name],CMA

## 2013-07-16 DIAGNOSIS — M4800 Spinal stenosis, site unspecified: Secondary | ICD-10-CM | POA: Diagnosis not present

## 2013-07-16 DIAGNOSIS — IMO0001 Reserved for inherently not codable concepts without codable children: Secondary | ICD-10-CM | POA: Diagnosis not present

## 2013-07-16 DIAGNOSIS — I10 Essential (primary) hypertension: Secondary | ICD-10-CM | POA: Diagnosis not present

## 2013-07-16 DIAGNOSIS — S43429A Sprain of unspecified rotator cuff capsule, initial encounter: Secondary | ICD-10-CM | POA: Diagnosis not present

## 2013-07-16 DIAGNOSIS — G609 Hereditary and idiopathic neuropathy, unspecified: Secondary | ICD-10-CM | POA: Diagnosis not present

## 2013-07-16 DIAGNOSIS — M1990 Unspecified osteoarthritis, unspecified site: Secondary | ICD-10-CM | POA: Diagnosis not present

## 2013-07-16 NOTE — Telephone Encounter (Signed)
PA for Lidocaine 5% patch denied due to the diagnosis.  Letter regarding the denial was mailed out today from the pt's insurance.  [facility] pharmacy was notified.  Derl Barrow, RN

## 2013-07-17 ENCOUNTER — Ambulatory Visit (INDEPENDENT_AMBULATORY_CARE_PROVIDER_SITE_OTHER): Payer: Medicare Other | Admitting: *Deleted

## 2013-07-17 DIAGNOSIS — R55 Syncope and collapse: Secondary | ICD-10-CM

## 2013-07-17 LAB — MDC_IDC_ENUM_SESS_TYPE_REMOTE

## 2013-07-18 ENCOUNTER — Telehealth: Payer: Self-pay | Admitting: Internal Medicine

## 2013-07-18 DIAGNOSIS — IMO0001 Reserved for inherently not codable concepts without codable children: Secondary | ICD-10-CM | POA: Diagnosis not present

## 2013-07-18 DIAGNOSIS — I10 Essential (primary) hypertension: Secondary | ICD-10-CM | POA: Diagnosis not present

## 2013-07-18 DIAGNOSIS — M1990 Unspecified osteoarthritis, unspecified site: Secondary | ICD-10-CM | POA: Diagnosis not present

## 2013-07-18 DIAGNOSIS — S43429A Sprain of unspecified rotator cuff capsule, initial encounter: Secondary | ICD-10-CM | POA: Diagnosis not present

## 2013-07-18 DIAGNOSIS — M4800 Spinal stenosis, site unspecified: Secondary | ICD-10-CM | POA: Diagnosis not present

## 2013-07-18 DIAGNOSIS — G609 Hereditary and idiopathic neuropathy, unspecified: Secondary | ICD-10-CM | POA: Diagnosis not present

## 2013-07-18 NOTE — Telephone Encounter (Signed)
I have the first one and it is on the cart for Dr Lovena Le to sign.

## 2013-07-18 NOTE — Telephone Encounter (Signed)
New message     Dr Theda Sers at [facility] orth[facility]---need clearance for surgery.  Form was faxed weeks ago.  Daughter is faxing another form for [place] to complete as soon as possible please.

## 2013-07-19 NOTE — Telephone Encounter (Signed)
Spoke with daughter and let her know I had faxed over the clearance and spoke with Deyja Sochacki at Dr Theda Sers office. He is now scheduling for mid to late April

## 2013-07-22 ENCOUNTER — Telehealth: Payer: Self-pay | Admitting: Internal Medicine

## 2013-07-22 NOTE — Telephone Encounter (Signed)
Received request from Nurse fax box, documents faxed for surgical clearance. To: [employer] Fax number: [phone] Attention: [phone]/kdm

## 2013-07-23 DIAGNOSIS — M1990 Unspecified osteoarthritis, unspecified site: Secondary | ICD-10-CM | POA: Diagnosis not present

## 2013-07-23 DIAGNOSIS — S43429A Sprain of unspecified rotator cuff capsule, initial encounter: Secondary | ICD-10-CM | POA: Diagnosis not present

## 2013-07-23 DIAGNOSIS — I10 Essential (primary) hypertension: Secondary | ICD-10-CM | POA: Diagnosis not present

## 2013-07-23 DIAGNOSIS — M4800 Spinal stenosis, site unspecified: Secondary | ICD-10-CM | POA: Diagnosis not present

## 2013-07-23 DIAGNOSIS — G609 Hereditary and idiopathic neuropathy, unspecified: Secondary | ICD-10-CM | POA: Diagnosis not present

## 2013-07-23 DIAGNOSIS — IMO0001 Reserved for inherently not codable concepts without codable children: Secondary | ICD-10-CM | POA: Diagnosis not present

## 2013-07-25 ENCOUNTER — Other Ambulatory Visit: Payer: Self-pay | Admitting: Orthopedic Surgery

## 2013-07-26 DIAGNOSIS — G609 Hereditary and idiopathic neuropathy, unspecified: Secondary | ICD-10-CM | POA: Diagnosis not present

## 2013-07-26 DIAGNOSIS — S43429A Sprain of unspecified rotator cuff capsule, initial encounter: Secondary | ICD-10-CM | POA: Diagnosis not present

## 2013-07-26 DIAGNOSIS — M1990 Unspecified osteoarthritis, unspecified site: Secondary | ICD-10-CM | POA: Diagnosis not present

## 2013-07-26 DIAGNOSIS — M4800 Spinal stenosis, site unspecified: Secondary | ICD-10-CM | POA: Diagnosis not present

## 2013-07-26 DIAGNOSIS — I10 Essential (primary) hypertension: Secondary | ICD-10-CM | POA: Diagnosis not present

## 2013-07-26 DIAGNOSIS — IMO0001 Reserved for inherently not codable concepts without codable children: Secondary | ICD-10-CM | POA: Diagnosis not present

## 2013-07-29 ENCOUNTER — Telehealth: Payer: Self-pay | Admitting: Family Medicine

## 2013-07-29 DIAGNOSIS — F0280 Dementia in other diseases classified elsewhere without behavioral disturbance: Secondary | ICD-10-CM

## 2013-07-29 DIAGNOSIS — G309 Alzheimer's disease, unspecified: Principal | ICD-10-CM

## 2013-07-29 NOTE — Telephone Encounter (Signed)
Earnest Bailey, nurse with [employer] need to speak with provider regarding a verbal for dx in order to complete eval for patient's home healt care pca.  Please contact asap.

## 2013-07-30 ENCOUNTER — Telehealth: Payer: Self-pay | Admitting: Family Medicine

## 2013-07-30 DIAGNOSIS — IMO0001 Reserved for inherently not codable concepts without codable children: Secondary | ICD-10-CM | POA: Diagnosis not present

## 2013-07-30 DIAGNOSIS — M4800 Spinal stenosis, site unspecified: Secondary | ICD-10-CM | POA: Diagnosis not present

## 2013-07-30 DIAGNOSIS — M1990 Unspecified osteoarthritis, unspecified site: Secondary | ICD-10-CM | POA: Diagnosis not present

## 2013-07-30 DIAGNOSIS — G609 Hereditary and idiopathic neuropathy, unspecified: Secondary | ICD-10-CM | POA: Diagnosis not present

## 2013-07-30 DIAGNOSIS — G309 Alzheimer's disease, unspecified: Principal | ICD-10-CM

## 2013-07-30 DIAGNOSIS — F0280 Dementia in other diseases classified elsewhere without behavioral disturbance: Secondary | ICD-10-CM

## 2013-07-30 DIAGNOSIS — I10 Essential (primary) hypertension: Secondary | ICD-10-CM | POA: Diagnosis not present

## 2013-07-30 DIAGNOSIS — S43429A Sprain of unspecified rotator cuff capsule, initial encounter: Secondary | ICD-10-CM | POA: Diagnosis not present

## 2013-07-30 HISTORY — DX: Alzheimer's disease, unspecified: G30.9

## 2013-07-30 HISTORY — DX: Dementia in other diseases classified elsewhere, unspecified severity, without behavioral disturbance, psychotic disturbance, mood disturbance, and anxiety: F02.80

## 2013-07-30 NOTE — Telephone Encounter (Signed)
Spoke with MD, he is ok with making appt today or tomorrow if [facility] lou desires.  Spoke with Jordan [name], pt is not having and SOB, CP, fevers, or acting lethargic (that she is aware of).  She is going to get off early today and go check on her as she is getting the condition update from pts CNA.  Appt made for tomorrow AM, advised to elevate feet and given red flags (lethargy, SOB, Fever, CP) to go to the ED.  Jordan [name] agreeable. Edmar Blankenburg, [name]

## 2013-07-30 NOTE — Telephone Encounter (Signed)
Mrs. Farnam's bp is 182/83.  Has been showing fluctuating  numbers for the past 3 to 4 days.   Pulse is very low 46.  Also have swelling to feet.

## 2013-07-30 NOTE — Telephone Encounter (Signed)
Spoke with [place] and gave correct diagnosis code for alzheimers dementia.

## 2013-07-31 ENCOUNTER — Ambulatory Visit: Payer: Medicare Other

## 2013-08-01 DIAGNOSIS — S43429A Sprain of unspecified rotator cuff capsule, initial encounter: Secondary | ICD-10-CM | POA: Diagnosis not present

## 2013-08-01 DIAGNOSIS — I10 Essential (primary) hypertension: Secondary | ICD-10-CM | POA: Diagnosis not present

## 2013-08-01 DIAGNOSIS — IMO0001 Reserved for inherently not codable concepts without codable children: Secondary | ICD-10-CM | POA: Diagnosis not present

## 2013-08-01 DIAGNOSIS — M4800 Spinal stenosis, site unspecified: Secondary | ICD-10-CM | POA: Diagnosis not present

## 2013-08-01 DIAGNOSIS — M1990 Unspecified osteoarthritis, unspecified site: Secondary | ICD-10-CM | POA: Diagnosis not present

## 2013-08-01 DIAGNOSIS — G609 Hereditary and idiopathic neuropathy, unspecified: Secondary | ICD-10-CM | POA: Diagnosis not present

## 2013-08-05 ENCOUNTER — Encounter: Payer: Self-pay | Admitting: Family Medicine

## 2013-08-05 ENCOUNTER — Ambulatory Visit (INDEPENDENT_AMBULATORY_CARE_PROVIDER_SITE_OTHER): Payer: Medicare Other | Admitting: Family Medicine

## 2013-08-05 VITALS — BP 154/60 | HR 82 | Temp 97.9°F | Wt 140.7 lb

## 2013-08-05 DIAGNOSIS — G4700 Insomnia, unspecified: Secondary | ICD-10-CM

## 2013-08-05 DIAGNOSIS — I10 Essential (primary) hypertension: Secondary | ICD-10-CM

## 2013-08-05 DIAGNOSIS — N3941 Urge incontinence: Secondary | ICD-10-CM

## 2013-08-05 DIAGNOSIS — R350 Frequency of micturition: Secondary | ICD-10-CM | POA: Diagnosis not present

## 2013-08-05 LAB — POCT URINALYSIS DIPSTICK
BILIRUBIN UA: NEGATIVE
Glucose, UA: NEGATIVE
Ketones, UA: NEGATIVE
NITRITE UA: NEGATIVE
PH UA: 7
Protein, UA: 100
Spec Grav, UA: 1.015
Urobilinogen, UA: 0.2

## 2013-08-05 LAB — POCT UA - MICROSCOPIC ONLY

## 2013-08-05 MED ORDER — MIRTAZAPINE 15 MG PO TABS: 15.0000 mg | ORAL_TABLET | Freq: Every day | ORAL | Status: DC

## 2013-08-05 MED ORDER — SERTRALINE HCL 25 MG PO TABS: ORAL_TABLET | ORAL | Status: DC

## 2013-08-05 MED ORDER — CHLORTHALIDONE 15 MG PO TABS: 15.0000 mg | ORAL_TABLET | Freq: Every day | ORAL | Status: DC

## 2013-08-05 NOTE — Patient Instructions (Addendum)
Sertraline take one tablet by mouth daily at bed time for the next 7 days then take half a tablet at bed time daily for 7 days.  Remeron take 1 tablet by mouth once daily for 7 days and then take half a tablet daily for 7 days. Then discontinue the medication.  Please start taking the chlorthalidone each day for your blood pressure. You can stop taking the amlodipine.  Please schedule an appointment with Dr Ralene Muskrat office to discuss your urinary issues.

## 2013-08-06 ENCOUNTER — Other Ambulatory Visit: Payer: Self-pay | Admitting: *Deleted

## 2013-08-06 DIAGNOSIS — I10 Essential (primary) hypertension: Secondary | ICD-10-CM | POA: Diagnosis not present

## 2013-08-06 DIAGNOSIS — G609 Hereditary and idiopathic neuropathy, unspecified: Secondary | ICD-10-CM | POA: Diagnosis not present

## 2013-08-06 DIAGNOSIS — S43429A Sprain of unspecified rotator cuff capsule, initial encounter: Secondary | ICD-10-CM | POA: Diagnosis not present

## 2013-08-06 DIAGNOSIS — M4800 Spinal stenosis, site unspecified: Secondary | ICD-10-CM | POA: Diagnosis not present

## 2013-08-06 DIAGNOSIS — M1990 Unspecified osteoarthritis, unspecified site: Secondary | ICD-10-CM | POA: Diagnosis not present

## 2013-08-06 DIAGNOSIS — IMO0001 Reserved for inherently not codable concepts without codable children: Secondary | ICD-10-CM | POA: Diagnosis not present

## 2013-08-06 MED ORDER — CHLORTHALIDONE 25 MG PO TABS: 12.5000 mg | ORAL_TABLET | Freq: Every day | ORAL | Status: DC

## 2013-08-07 ENCOUNTER — Emergency Department (HOSPITAL_COMMUNITY)
Admission: EM | Admit: 2013-08-07 | Discharge: 2013-08-07 | Disposition: A | Discharge status: Home / Self Care | Payer: Medicare Other | Attending: Emergency Medicine | Admitting: Emergency Medicine

## 2013-08-07 ENCOUNTER — Emergency Department (HOSPITAL_COMMUNITY): Payer: Medicare Other

## 2013-08-07 ENCOUNTER — Telehealth: Payer: Self-pay | Admitting: Family Medicine

## 2013-08-07 ENCOUNTER — Encounter (HOSPITAL_COMMUNITY): Payer: Self-pay | Admitting: Emergency Medicine

## 2013-08-07 DIAGNOSIS — M129 Arthropathy, unspecified: Secondary | ICD-10-CM | POA: Diagnosis not present

## 2013-08-07 DIAGNOSIS — I10 Essential (primary) hypertension: Secondary | ICD-10-CM | POA: Diagnosis not present

## 2013-08-07 DIAGNOSIS — Z7982 Long term (current) use of aspirin: Secondary | ICD-10-CM | POA: Insufficient documentation

## 2013-08-07 DIAGNOSIS — R4181 Age-related cognitive decline: Secondary | ICD-10-CM | POA: Insufficient documentation

## 2013-08-07 DIAGNOSIS — IMO0002 Reserved for concepts with insufficient information to code with codable children: Secondary | ICD-10-CM | POA: Diagnosis not present

## 2013-08-07 DIAGNOSIS — G4700 Insomnia, unspecified: Secondary | ICD-10-CM | POA: Diagnosis not present

## 2013-08-07 DIAGNOSIS — E039 Hypothyroidism, unspecified: Secondary | ICD-10-CM | POA: Diagnosis not present

## 2013-08-07 DIAGNOSIS — M810 Age-related osteoporosis without current pathological fracture: Secondary | ICD-10-CM | POA: Diagnosis not present

## 2013-08-07 DIAGNOSIS — G609 Hereditary and idiopathic neuropathy, unspecified: Secondary | ICD-10-CM | POA: Insufficient documentation

## 2013-08-07 DIAGNOSIS — M25529 Pain in unspecified elbow: Secondary | ICD-10-CM | POA: Diagnosis not present

## 2013-08-07 DIAGNOSIS — R222 Localized swelling, mass and lump, trunk: Secondary | ICD-10-CM | POA: Insufficient documentation

## 2013-08-07 DIAGNOSIS — L0390 Cellulitis, unspecified: Secondary | ICD-10-CM

## 2013-08-07 DIAGNOSIS — M7989 Other specified soft tissue disorders: Secondary | ICD-10-CM | POA: Diagnosis not present

## 2013-08-07 DIAGNOSIS — M542 Cervicalgia: Secondary | ICD-10-CM | POA: Diagnosis not present

## 2013-08-07 DIAGNOSIS — Z79899 Other long term (current) drug therapy: Secondary | ICD-10-CM | POA: Diagnosis not present

## 2013-08-07 LAB — CBC WITH DIFFERENTIAL/PLATELET
Basophils Absolute: 0 10*3/uL (ref 0.0–0.1)
Basophils Relative: 0 % (ref 0–1)
EOS ABS: 0.2 10*3/uL (ref 0.0–0.7)
Eosinophils Relative: 2 % (ref 0–5)
HCT: 34.4 % — ABNORMAL LOW (ref 36.0–46.0)
HEMOGLOBIN: 11.3 g/dL — AB (ref 12.0–15.0)
LYMPHS ABS: 2 10*3/uL (ref 0.7–4.0)
Lymphocytes Relative: 17 % (ref 12–46)
MCH: 32.4 pg (ref 26.0–34.0)
MCHC: 32.8 g/dL (ref 30.0–36.0)
MCV: 98.6 fL (ref 78.0–100.0)
Monocytes Absolute: 1.4 10*3/uL — ABNORMAL HIGH (ref 0.1–1.0)
Monocytes Relative: 12 % (ref 3–12)
NEUTROS ABS: 8.1 10*3/uL — AB (ref 1.7–7.7)
NEUTROS PCT: 69 % (ref 43–77)
Platelets: 182 10*3/uL (ref 150–400)
RBC: 3.49 MIL/uL — AB (ref 3.87–5.11)
RDW: 12.8 % (ref 11.5–15.5)
WBC: 11.7 10*3/uL — AB (ref 4.0–10.5)

## 2013-08-07 LAB — I-STAT CHEM 8, ED
BUN: 27 mg/dL — ABNORMAL HIGH (ref 6–23)
CREATININE: 1.4 mg/dL — AB (ref 0.50–1.10)
Calcium, Ion: 1.19 mmol/L (ref 1.13–1.30)
Chloride: 97 mEq/L (ref 96–112)
Glucose, Bld: 105 mg/dL — ABNORMAL HIGH (ref 70–99)
HEMATOCRIT: 33 % — AB (ref 36.0–46.0)
Hemoglobin: 11.2 g/dL — ABNORMAL LOW (ref 12.0–15.0)
POTASSIUM: 4.3 meq/L (ref 3.7–5.3)
SODIUM: 137 meq/L (ref 137–147)
TCO2: 31 mmol/L (ref 0–100)

## 2013-08-07 MED ORDER — HYDROCODONE-ACETAMINOPHEN 5-325 MG PO TABS
1.0000 | ORAL_TABLET | Freq: Once | ORAL | Status: AC
  Administered 2013-08-07: 1 via ORAL
  Filled 2013-08-07: qty 1

## 2013-08-07 MED ORDER — CEPHALEXIN 250 MG PO CAPS: 250.0000 mg | ORAL_CAPSULE | Freq: Four times a day (QID) | ORAL | Status: DC

## 2013-08-07 MED ORDER — HYDROCODONE-ACETAMINOPHEN 5-325 MG PO TABS: 1.0000 | ORAL_TABLET | Freq: Four times a day (QID) | ORAL | Status: DC | PRN

## 2013-08-07 MED ORDER — OXYCODONE-ACETAMINOPHEN 5-325 MG PO TABS
1.0000 | ORAL_TABLET | Freq: Once | ORAL | Status: AC
  Administered 2013-08-07: 1 via ORAL
  Filled 2013-08-07: qty 1

## 2013-08-07 MED ORDER — CEPHALEXIN 250 MG PO CAPS
250.0000 mg | ORAL_CAPSULE | Freq: Once | ORAL | Status: AC
  Administered 2013-08-07: 250 mg via ORAL
  Filled 2013-08-07: qty 1

## 2013-08-07 NOTE — ED Notes (Signed)
Spoke at great lengths with daughter about pt's plan of care and apologized about wait for room placement. Daughter concerned that pt hasn't been seen by a doctor. Explained to daughter that the Nurse Practitioner Manuela Neptune has been in to evaluate the pt, and that Dr. Christy Gentles is her attending.   Offered pt Vicodin for pain. Daughter requesting more percocet because "it worked a little last time and I don't want to try new things." NP Standley Brooking made aware, states that Vicodin is more appropriate for pt dt her age. Daughter okay with this medication being administered.

## 2013-08-07 NOTE — ED Notes (Signed)
Pt updated on status and VS reassessed

## 2013-08-07 NOTE — ED Notes (Signed)
[place] at bedside.

## 2013-08-07 NOTE — Assessment & Plan Note (Signed)
At goal on Recheck. Patient with edema on amlodipine. Will change to chlorthalidone. F/u in 2 weeks for recheck.

## 2013-08-07 NOTE — ED Notes (Signed)
Pt having right elbow swelling and severe pain that started last night. Pt has chronic right arm issues.

## 2013-08-07 NOTE — Telephone Encounter (Signed)
Daughter called and her mother is in extreme pain in her elbow area. It is swollen and very pain to even the slightest touch. Her mother could not sleep because of the pain. Please call to discuss jw.

## 2013-08-07 NOTE — ED Notes (Signed)
Myself and Tonia Ghent, NT assisted pt getting out of car

## 2013-08-07 NOTE — ED Notes (Signed)
Pt reports right elbow pain since yesterday. Mild swelling noted to area. Pain with palpation and movement. Pt denies any CP, SOB. Hx arthritis, chronic right shoulder pain. Family at bedside reports that she is scheduled to have a right arm surgery next month.

## 2013-08-07 NOTE — Telephone Encounter (Signed)
Daughter called back to let Dr. Caryl Bis know that they took her mother to the ER at [facility] and they are doing xrays now. jw

## 2013-08-07 NOTE — Progress Notes (Signed)
Patient ID: Jordan [name], female   DOB: 11-05-1919, 80 y.o.   MRN: [id number]  [name], MD Phone: [phone]  Jordan [name] is a 80 y.o. female who presents today for f/u.  Sleep: has trouble falling asleep. Goes to bed around 11 pm and notes she does not fall asleep until 1-2 am. Notes watching TV prior to bed. Has a routine. She gets up around 10 am. Notes she gets up 2-3x/night to use the restroom. She does not feel like napping. States the issue most bothering her about this is that when she is awake she feels the pain. She has been taking remeron at bed time without benefit.  Urinary issues: notes she has been using the restroom more frequently. 5-6x/day and 2-3x at night. She does nothave any dysuria. She does have urgency. She has been on trospium which has helped, though has not seen Dr Jeffie Pollock in some time.  HYPERTENSION Disease Monitoring Home BP Monitoring checking, typically elevated to Q000111Q systolic Chest pain- no    Dyspnea- she notes fatigue when walking, but no orhtopnea, and had recent echo with normal EF Medications Compliance-  taking  Edema- minimal in ankles  Patient is a nonsmoker.    ROS: Per HPI   Physical Exam Filed Vitals:   08/05/13 1611  BP: 154/60  Pulse: 82  Temp: 97.9 F (36.6 C)    Gen: Well NAD HEENT: EOMI,  MMM Lungs: CTABL Nl WOB Heart: RRR no MRG Exts: mild edema in bilateral LE at the ankles, non-pitting  LE, warm and well perfused.    Assessment/Plan: Please see individual problem list.

## 2013-08-07 NOTE — Assessment & Plan Note (Signed)
Patient with continued issues with this. Current medication is becoming less effective. Has been followed by Dr Jeffie Pollock in the past year, though son states they missed her last appointment. Advised to set up follow-up with Dr Jeffie Pollock. Could consider Myrbetriq to minimize anticholinergic effects.

## 2013-08-07 NOTE — Telephone Encounter (Signed)
Pt is at the ED now with a swollen elbow and pain.  Per pt's daughter, mom did not hit elbow.  Pt wants PCP informed.  Will forward to provider.  Derl Barrow, RN

## 2013-08-07 NOTE — Discharge Instructions (Signed)
Celulitis (Celulitis)  La celulitis es una infeccin de la piel y del tejido que se encuentra debajo de la piel. El rea infectada generalmente est de color rojo y duele. Ocurre con ms frecuencia en los brazos y en las piernas. [place] los antibiticos tal como se le indic. Finalice el [place], aunque comience a Sports administrator.  Mantenga el brazo o la pierna infectada elevada.  Aplique paos calientes en la zona hasta 4 veces al da.  Tome slo los medicamentos que le haya indicado el mdico.  Cumpla con los controles mdicos segn las indicaciones. SOLICITE AYUDA DE INMEDIATO SI:   Tiene fiebre.  Se siente muy somnoliento.  Vomita o tiene diarrea.  Se siente enfermo y tiene [employer].  Observa una lnea roja en la piel que sale desde la herida.  El rea roja se extiende o se vuelve de color oscuro.  El hueso o la articulacin que se encuentran por debajo de la zona infectada le duelen despus de que la piel se [place].  La infeccin se repite en la misma zona o en una zona diferente.  Tiene un bulto inflamado (hinchado) en la zona infectada.  Aparecen nuevos sntomas. ASEGRESE DE QUE:   Comprende estas instrucciones.  Controlar su enfermedad.  Solicitar ayuda de inmediato si no mejora o si empeora. Document Released: 10/06/2009 Document Revised: 10/18/2011 [facility] Patient Information 2014 [place], [place]. Please give your mother, the antibiotic as directed.  Please watch the area carefully.  If she develops new or worsening symptoms, worsening pain, worsening swelling, worsening redness.  Please followup with your primary care physician, I would like you to make an appointment to be seen by your primary care physician on Friday to make, sure that we are going in the right direction.  As far as healing

## 2013-08-07 NOTE — ED Provider Notes (Signed)
CSN: [id number]     Arrival date & time 08/07/13  1516 History   First MD Initiated Contact with Patient 08/07/13 1957     Chief Complaint  Patient presents with  . Elbow Pain     (Consider location/radiation/quality/duration/timing/severity/associated sxs/prior Treatment) HPI Comments: Take his family is acting as interpreters presents with 24 hours of right elbow.  Pain.  Denies any trauma, falls, or banging the elbow.  She states it was slightly reddened.  She has been given 400 mg of ibuprofen x2 today without significant relief in her pain.  Chest contacted her primary care physician  The history is provided by the patient and a relative. The history is limited by a language barrier. A language interpreter was used.    Past Medical History  Diagnosis Date  . Lung mass     right apical need CT in end of 2012  . Cervicalgia   . Peripheral neuropathy   . Frailty   . Hypertension   . Hypothyroid   . Osteoporosis   . Insomnia   . Arthritis    Past Surgical History  Procedure Laterality Date  . Abdominal hysterectomy     Family History  Problem Relation Age of Onset  . Kidney disease Mother    History  Substance Use Topics  . Smoking status: Never Smoker   . Smokeless tobacco: Never Used  . Alcohol Use: No   OB History   Grav Para Term Preterm Abortions TAB SAB Ect Mult Living                 Review of Systems  Constitutional: Negative for fever and chills.  Musculoskeletal: Positive for arthralgias.  Skin: Positive for color change. Negative for rash and wound.  Neurological: Negative for numbness.  All other systems reviewed and are negative.     Allergies  Honey bee treatment  Home Medications   Current Outpatient Rx  Name  Route  Sig  Dispense  Refill  . aspirin 81 MG tablet   Oral   Take 81 mg by mouth daily.         . Calcium Carbonate-Vitamin D (CALTRATE 600+D) 600-400 MG-UNIT per tablet   Oral   Take 1 tablet by mouth 2 (two) times daily.            . chlorthalidone (HYGROTON) 25 MG tablet   Oral   Take 0.5 tablets (12.5 mg total) by mouth daily.   30 tablet   2   . diclofenac sodium (VOLTAREN) 1 % GEL   Topical   Apply 2 g topically 4 (four) times daily.   10 Tube   1   . docusate sodium (COLACE) 100 MG capsule   Oral   Take 2 capsules (200 mg total) by mouth every 12 (twelve) hours.   60 capsule   0   . Glucosamine-Chondroitin-Vit D3 1500-1200-800 MG-MG-UNIT PACK   Oral   Take 1 tablet by mouth 2 (two) times daily.           Marland Kitchen HYDROcodone-acetaminophen (NORCO/VICODIN) 5-325 MG per tablet   Oral   Take 1 tablet by mouth every 4 (four) hours as needed for pain.   30 tablet   0   . levothyroxine (SYNTHROID, LEVOTHROID) 112 MCG tablet      take 1 tablet by mouth once daily   90 tablet   0   . lidocaine (LIDODERM) 5 %      APPLY 1 PATCH TO SKIN DAILY. MAY  CUT PATCH AND USE 1/4 OR 1/2 OF PATCH INSTEAD   30 patch   0   . meloxicam (MOBIC) 7.5 MG tablet   Oral   Take 1 tablet (7.5 mg total) by mouth daily.   30 tablet   0   . mirtazapine (REMERON) 15 MG tablet   Oral   Take 1 tablet (15 mg total) by mouth at bedtime. Take one tablet by mouth daily at bed time for the next 7 days then take half a tablet at bed time daily for 7 days. Then discontinue the medication.   30 tablet   1   . polyethylene glycol (MIRALAX) packet   Oral   Take 17 g by mouth daily.   14 each   0   . RESTASIS 0.05 % ophthalmic emulsion   Both Eyes   Place 1 drop into both eyes daily.          . sertraline (ZOLOFT) 25 MG tablet      take 1 tablet by mouth once daily for 7 days and then take half a tablet daily for 7 days. Then discontinue the medication.   30 tablet   0   . TRAVATAN Z 0.004 % SOLN ophthalmic solution   Both Eyes   Place 1 drop into both eyes at bedtime.          . trospium (SANCTURA) 20 MG tablet   Oral   Take 20 mg by mouth daily.          . cephALEXin (KEFLEX) 250 MG capsule    Oral   Take 1 capsule (250 mg total) by mouth 4 (four) times daily.   27 capsule   0   . HYDROcodone-acetaminophen (NORCO/VICODIN) 5-325 MG per tablet   Oral   Take 1 tablet by mouth every 6 (six) hours as needed for moderate pain.   12 tablet   0    BP 151/48  Pulse 72  Temp(Src) 97.9 F (36.6 C) (Oral)  Resp 18  SpO2 99% Physical Exam  Nursing note and vitals reviewed. Constitutional: She appears well-developed and well-nourished.  Eyes: Pupils are equal, round, and reactive to light.  Neck: Normal range of motion.  Cardiovascular: Normal rate and regular rhythm.   Pulmonary/Chest: Effort normal and breath sounds normal.  Musculoskeletal: She exhibits tenderness. She exhibits no edema.       Right elbow: She exhibits decreased range of motion. She exhibits no swelling, no effusion, no deformity and no laceration. Tenderness found. Lateral epicondyle tenderness noted. No olecranon process tenderness noted.  Neurological: She is alert.  Skin: Skin is warm. There is erythema.    ED Course  Procedures (including critical care time) Labs Review Labs Reviewed  CBC WITH DIFFERENTIAL - Abnormal; Notable for the following:    WBC 11.7 (*)    RBC 3.49 (*)    Hemoglobin 11.3 (*)    HCT 34.4 (*)    Neutro Abs 8.1 (*)    Monocytes Absolute 1.4 (*)    All other components within normal limits  I-STAT CHEM 8, ED - Abnormal; Notable for the following:    BUN 27 (*)    Creatinine, Ser 1.40 (*)    Glucose, Bld 105 (*)    Hemoglobin 11.2 (*)    HCT 33.0 (*)    All other components within normal limits   Imaging Review Dg Elbow Complete Right  08/07/2013   CLINICAL DATA:  Right elbow pain and discomfort for 1 day  EXAM: RIGHT ELBOW - COMPLETE 3+ VIEW  COMPARISON:  None  FINDINGS: No displaced fracture or elbow fusion. There is minimal enthesopathic change about the medial and lateral epicondyles. Joint spaces are grossly preserved. There is mild soft tissue swelling about the  posterior aspect of the elbow without associated radiopaque foreign body.  IMPRESSION: Mild soft tissue swelling about the posterior aspect of the elbow without associated displaced fracture or elbow joint effusion.   Electronically Signed   By: Sandi Mariscal M.D.   On: 08/07/2013 17:02     EKG Interpretation None      MDM  Patient is adamant that she has not injured her bumped her elbow.  She has slight erythema on the medial aspect of the olecranon process without swelling.  Distal pulses are intact.  No decreased range of motion of the hand or wrist or elbow. X-rays normal and will obtain CBC and i-STAT, patient does not have a systemic temperature CBC reveals a white count of 11.2, which is slightly elevated.  Will start on Keflex 4 times a day for one week with today.  Followup with her primary care physician.  He been given instructions to return if she develops new or worsening symptoms.  Worsening swelling, erythema, prior to the today.  Mark Final diagnoses:  Cellulitis         Garald Balding, NP 08/07/13 2132

## 2013-08-07 NOTE — Assessment & Plan Note (Signed)
Continues to have sleeping issues. Will titrate down further on remeron and sertraline to goal of off over the next 2 weeks. Will then give consideration of trazodone for sleep. May be that nothing we can provide for her medication wise will help with this. F/u in 2 weeks.

## 2013-08-08 DIAGNOSIS — IMO0001 Reserved for inherently not codable concepts without codable children: Secondary | ICD-10-CM | POA: Diagnosis not present

## 2013-08-08 DIAGNOSIS — I10 Essential (primary) hypertension: Secondary | ICD-10-CM | POA: Diagnosis not present

## 2013-08-08 DIAGNOSIS — G609 Hereditary and idiopathic neuropathy, unspecified: Secondary | ICD-10-CM | POA: Diagnosis not present

## 2013-08-08 DIAGNOSIS — S43429A Sprain of unspecified rotator cuff capsule, initial encounter: Secondary | ICD-10-CM | POA: Diagnosis not present

## 2013-08-08 DIAGNOSIS — M1990 Unspecified osteoarthritis, unspecified site: Secondary | ICD-10-CM | POA: Diagnosis not present

## 2013-08-08 DIAGNOSIS — M4800 Spinal stenosis, site unspecified: Secondary | ICD-10-CM | POA: Diagnosis not present

## 2013-08-08 NOTE — ED Provider Notes (Signed)
Medical screening examination/treatment/procedure(s) were conducted as a shared visit with non-physician practitioner(s) and myself.  I personally evaluated the patient during the encounter.   EKG Interpretation None       Pt well appearing, no distress, small area of erythema on elbow but able to range elbow, distal pulses intact, low suspicion for acute DVT, advised f/u with PCP this week  Sharyon Cable, MD 08/08/13 [phone]

## 2013-08-09 DIAGNOSIS — M79609 Pain in unspecified limb: Secondary | ICD-10-CM | POA: Diagnosis not present

## 2013-08-14 ENCOUNTER — Other Ambulatory Visit: Payer: Self-pay | Admitting: Family Medicine

## 2013-08-14 NOTE — H&P (Signed)
Jordan [name] is an 80 y.o. female.   Chief Complaint: Right shoulder pain HPI: Patient presents with joint discomfort that had been persistent for several months now. Despite conservative treatments, his discomfort has not improved. Imaging was obtained. Other conservative and surgical treatments were discussed in detail. Patient wishes to proceed with surgery as consented. Denies SOB, CP, or calf pain. No Fever, chills, or nausea/ vomiting. Cleared prior to surgery by Dr. Lovena Le and Dr. Caryl Bis   Past Medical History  Diagnosis Date  . Lung mass     right apical need CT in end of 2012  . Cervicalgia   . Peripheral neuropathy   . Frailty   . Hypertension   . Hypothyroid   . Osteoporosis   . Insomnia   . Arthritis     Past Surgical History  Procedure Laterality Date  . Abdominal hysterectomy      Family History  Problem Relation Age of Onset  . Kidney disease Mother    Social History:  reports that she has never smoked. She has never used smokeless tobacco. She reports that she does not drink alcohol or use illicit drugs.  Allergies:  Allergies  Allergen Reactions  . Honey Bee Treatment [Bee Venom]     Rash     No prescriptions prior to admission    No results found for this or any previous visit (from the past 48 hour(s)). No results found.  Review of Systems  Constitutional: Negative.   HENT:       Dentures  Eyes: Positive for blurred vision.  Respiratory: Negative.   Cardiovascular: Negative for chest pain, palpitations, orthopnea and leg swelling.       HTN on medication, H/o arrhythmias  Gastrointestinal:       H/O Hernia  Genitourinary: Negative.   Musculoskeletal: Positive for joint pain and neck pain.  Skin: Negative.   Neurological: Negative.   Endo/Heme/Allergies: Negative.   Psychiatric/Behavioral: Negative.     There were no vitals taken for this visit. Physical Exam  Constitutional: She is oriented to person, place, and time. She appears  well-nourished.  HENT:  Head: Normocephalic.  Eyes: EOM are normal.  Neck: Normal range of motion.  Cardiovascular: Normal rate, normal heart sounds and intact distal pulses.   Respiratory: Effort normal.  GI: Soft.  Genitourinary:  deferred  Musculoskeletal: She exhibits edema and tenderness.  RUE  Neurological: She is alert and oriented to person, place, and time.  Skin: Skin is warm and dry.  Psychiatric: Her behavior is normal.     Assessment/Plan Right shoulder RCT. Plan for Arthroscopy for debridement vs repair RCT, SAd, DCR, biceps tenotomy. Plan for hospital stay over night   Narda Fundora L Daesia Zylka 08/14/2013, 8:59 AM

## 2013-08-15 ENCOUNTER — Ambulatory Visit (INDEPENDENT_AMBULATORY_CARE_PROVIDER_SITE_OTHER): Payer: Medicare Other | Admitting: Family Medicine

## 2013-08-15 VITALS — BP 181/50 | HR 43 | Temp 97.9°F | Resp 18 | Ht <= 58 in | Wt 136.7 lb

## 2013-08-15 DIAGNOSIS — G4700 Insomnia, unspecified: Secondary | ICD-10-CM | POA: Diagnosis not present

## 2013-08-15 DIAGNOSIS — I10 Essential (primary) hypertension: Secondary | ICD-10-CM | POA: Diagnosis not present

## 2013-08-15 DIAGNOSIS — R112 Nausea with vomiting, unspecified: Secondary | ICD-10-CM

## 2013-08-15 DIAGNOSIS — R51 Headache: Secondary | ICD-10-CM | POA: Diagnosis not present

## 2013-08-15 DIAGNOSIS — R110 Nausea: Secondary | ICD-10-CM

## 2013-08-15 DIAGNOSIS — R109 Unspecified abdominal pain: Secondary | ICD-10-CM

## 2013-08-15 MED ORDER — ONDANSETRON HCL 4 MG PO TABS: 4.0000 mg | ORAL_TABLET | Freq: Three times a day (TID) | ORAL | Status: DC | PRN

## 2013-08-15 NOTE — Patient Instructions (Signed)
Nice to see you. Sorry you are having such bad nausea. We will treat this with zofran which has been sent to the pharmacy.  Please stop the indomethacin. This is not a good medication for elderly patients. You can treat her headache with tylenol. Once she stops the remeron and sertaline, we will start the tramadol. Please let me know the last day you take this and I will send in a prescription for this medication. For your blood pressure we will keep the chlothalidone as it stands right now. I will have you see our pharmacist to get set up with a 24 hour blood pressure monitor. Once her labs come back I will make a decision on her blood pressure medications.  Tension Headache A tension headache is pain, pressure, or aching felt over the front and sides of the head. Tension headaches often come after stress, feeling worried (anxiety), or feeling sad or down for a while (depressed). HOME CARE  Only take medicine as told by your doctor.  Lie down in a dark, quiet room when you have a headache.  Keep a journal to find out if certain things bring on headaches. For example, write down:  What you eat and drink.  How much sleep you get.  Any change to your diet or medicines.  Relax by getting a massage or doing other relaxing activities.  Put ice or heat packs on the head and neck area as told by your doctor.  Lessen stress.  Sit up straight. Do not tighten (tense) your muscles.  Quit smoking if you smoke.  Lessen how much alcohol you drink.  Lessen how much caffeine you drink, or stop drinking caffeine.  Eat and exercise regularly.  Get enough sleep.  Avoid using too much pain medicine. GET HELP RIGHT AWAY IF:   Your headache becomes really bad.  You have a fever.  You have a stiff neck.  You have trouble seeing.  Your muscles are weak, or you lose muscle control.  You lose your balance or have trouble walking.  You feel like you will pass out (faint), or you pass  out.  You have really bad symptoms that are different than your first symptoms.  You have problems with the medicines given to you by your doctor.  Your medicines do not work.  Your headache feels different than the other headaches.  You feel sick to your stomach (nauseous) or throw up (vomit). MAKE SURE YOU:   Understand these instructions.  Will watch your condition.  Will get help right away if you are not doing well or get worse. Document Released: 07/13/2009 Document Revised: 07/11/2011 Document Reviewed: 04/08/2011 [facility] Patient Information 2014 [place], [place].

## 2013-08-16 ENCOUNTER — Other Ambulatory Visit: Payer: Self-pay | Admitting: Family Medicine

## 2013-08-16 ENCOUNTER — Ambulatory Visit (INDEPENDENT_AMBULATORY_CARE_PROVIDER_SITE_OTHER): Payer: Medicare Other | Admitting: *Deleted

## 2013-08-16 ENCOUNTER — Telehealth: Payer: Self-pay | Admitting: *Deleted

## 2013-08-16 ENCOUNTER — Encounter: Payer: Self-pay | Admitting: *Deleted

## 2013-08-16 DIAGNOSIS — R55 Syncope and collapse: Secondary | ICD-10-CM | POA: Diagnosis not present

## 2013-08-16 LAB — MDC_IDC_ENUM_SESS_TYPE_REMOTE: Date Time Interrogation Session: 20150424040500

## 2013-08-16 LAB — COMPREHENSIVE METABOLIC PANEL
ALT: 15 U/L (ref 0–35)
AST: 21 U/L (ref 0–37)
Albumin: 3.4 g/dL — ABNORMAL LOW (ref 3.5–5.2)
Alkaline Phosphatase: 83 U/L (ref 39–117)
BUN: 31 mg/dL — ABNORMAL HIGH (ref 6–23)
CHLORIDE: 92 meq/L — AB (ref 96–112)
CO2: 31 meq/L (ref 19–32)
Calcium: 9.8 mg/dL (ref 8.4–10.5)
Creat: 1.26 mg/dL — ABNORMAL HIGH (ref 0.50–1.10)
Glucose, Bld: 102 mg/dL — ABNORMAL HIGH (ref 70–99)
Potassium: 4.5 mEq/L (ref 3.5–5.3)
Sodium: 130 mEq/L — ABNORMAL LOW (ref 135–145)
Total Bilirubin: 0.3 mg/dL (ref 0.2–1.2)
Total Protein: 6.8 g/dL (ref 6.0–8.3)

## 2013-08-16 LAB — CBC
HCT: 35.5 % — ABNORMAL LOW (ref 36.0–46.0)
Hemoglobin: 12 g/dL (ref 12.0–15.0)
MCH: 31 pg (ref 26.0–34.0)
MCHC: 33.8 g/dL (ref 30.0–36.0)
MCV: 91.7 fL (ref 78.0–100.0)
PLATELETS: 326 10*3/uL (ref 150–400)
RBC: 3.87 MIL/uL (ref 3.87–5.11)
RDW: 13.2 % (ref 11.5–15.5)
WBC: 9.5 10*3/uL (ref 4.0–10.5)

## 2013-08-16 MED ORDER — VERAPAMIL HCL 80 MG PO TABS: 40.0000 mg | ORAL_TABLET | Freq: Three times a day (TID) | ORAL | Status: DC

## 2013-08-16 NOTE — Progress Notes (Signed)
Prior authorization received from [facility] for Ondansetron HCL 4 mg tablets. PA form placed in provider's box for review. Derl Barrow, RN

## 2013-08-16 NOTE — Telephone Encounter (Signed)
Lm for daughter Stasia Cavalier on her cell phone with message from [place].  Advised her to call with any questions.  [name],CMA

## 2013-08-16 NOTE — Telephone Encounter (Signed)
Message copied by [name] on Fri Aug 16, 2013  4:21 PM ------      Message from: Caryl Bis, ERIC G      Created: Fri Aug 16, 2013  1:37 PM       Patient has developed hyponatremia since starting on chlorthalidone. We will need to change her blood pressure medication as the chlorthalidone is the likely cause of this drop in her sodium. I will send in a prescription for verapamil (which is a calcium channel blocker) for the patient to switch to for her blood pressure. The patients family should be advisd that if her blood pressure is consistently lower than Q000111Q systolic on this medication they need to let us know. She will need to come in for a lab visit at the end of next week to ensure that her sodium level has improved. If they have any questions please let me know. ------

## 2013-08-18 ENCOUNTER — Encounter: Payer: Self-pay | Admitting: Family Medicine

## 2013-08-18 DIAGNOSIS — R51 Headache: Secondary | ICD-10-CM

## 2013-08-18 DIAGNOSIS — R519 Headache, unspecified: Secondary | ICD-10-CM | POA: Insufficient documentation

## 2013-08-18 MED ORDER — TRAZODONE HCL 50 MG PO TABS: 25.0000 mg | ORAL_TABLET | Freq: Every evening | ORAL | Status: DC | PRN

## 2013-08-18 NOTE — Assessment & Plan Note (Addendum)
Patient with several days of nausea in the setting of starting indomethicine. Likely this is the culprit and we will discontinue this today. There were no exam abnormalities to indicate emergent cause. Will give zofran for nausea and change pain medication to tramadol on Monday once the patient has finished tapering remeron and sertraline on Sunday. Will check CBC for WBC to ensure no infectious process is occurring.

## 2013-08-18 NOTE — Progress Notes (Signed)
Patient ID: SHONIKA WERLEIN, female   DOB: 04/08/1920, 80 y.o.   MRN: [id number]  [name], MD Phone: [phone]  GIANNIE MCPHATTER is a 80 y.o. female who presents today for same day appointment.  Nausea: for the last couple of days she has felt as though she wanted to vomit. Was heaving the day prior to this visit. No vomiting, diarrhea, or abdominal pain. She has been taking indomethicin prescribed by the orthopedist since last Friday. She is drinking less than usual as she feels she will vomit this up.  Headache: notes over the past couple of days. In the back of her head. Has had intermittent headache for years, usually 2x/year. This headache was gradual in onset and dull in nature. No neurological changes.  Patient has a previous history of headache. This was added to the history section of her chart.   ROS: Per HPI   Physical Exam Filed Vitals:   08/15/13 1603  BP: 181/50  Pulse: 43  Temp: 97.9 F (36.6 C)  Resp: 18    Gen: Well NAD HEENT: EOMI,  MMM Lungs: CTABL Nl WOB Heart: RRR no MRG Abd: NABS, NT, ND Exts: Non edematous BL  LE, warm and well perfused.  Neuro:  Hearing diminished bilaterally at her baseline otherwise CN 2-12 intact, 5/5 strength in bilateral biceps, deltoids, triceps, grip, hip flexors, quads, hamstrings, plantar and dorsiflexion, sensation to light touch intact in bilateral upper and lower extremities, gait is at patients baseline  Assessment/Plan: Please see individual problem list.

## 2013-08-18 NOTE — Assessment & Plan Note (Addendum)
Patient is to be finished tapering sertraline and remeron on Sunday. Will d/c these at that time and start trazodone on Monday for sleep difficulty. Prescription sent in.

## 2013-08-18 NOTE — Assessment & Plan Note (Signed)
Patient reports recurrence of headache. This has been a chronic issue for her. There are no neurological abnormalities on exam. Likely is a tension headache. Will give rx for tramadol to be started on Monday after taper of remeron and sertraline is completed on Sunday. F/u prn.

## 2013-08-18 NOTE — Assessment & Plan Note (Signed)
Not at goal on new chlorthalidone. Cr checked at recent ED visit was elevated from previous. Will check CMET today to eval electrolytes and Cr. Will base any changes in treatment off of this. Given recent fluctuations in BP with documented systolics in the [id number] in the ED and clinic along with elevations to 190's, will need to see Dr Valentina Lucks to get set up with a 24 hr BP monitor.

## 2013-08-19 NOTE — Progress Notes (Signed)
Patient ID: Jordan [name], female   DOB: 25-Feb-1920, 80 y.o.   MRN: [id number] Prior authorization filled out and returned to [facility].

## 2013-08-19 NOTE — Progress Notes (Signed)
PA for Ondansetron HCL 4 mg tab faxed to [facility] for review.  Derl Barrow, RN

## 2013-08-20 ENCOUNTER — Telehealth: Payer: Self-pay | Admitting: Family Medicine

## 2013-08-20 MED ORDER — TRAMADOL HCL 50 MG PO TABS: 25.0000 mg | ORAL_TABLET | Freq: Two times a day (BID) | ORAL | Status: DC | PRN

## 2013-08-20 NOTE — Telephone Encounter (Signed)
Mother called and would like a refill of Tramadol left up front for pick up today. jw

## 2013-08-20 NOTE — Telephone Encounter (Signed)
LMOVM for Jordan [name] to return call. Laban Emperor [name]

## 2013-08-20 NOTE — Progress Notes (Signed)
Received a fax from [facility] regarding prior authorization for Ondansetron HCL.  Per [employer] the request was dismissed due to an exception has already been granted.  Letter placed in provider box for review.  Derl Barrow, RN

## 2013-08-20 NOTE — Telephone Encounter (Signed)
They can increase the dosing to 50 mg nightly once she has been on the medication for one week. Please inform the patients daughter. Thanks.

## 2013-08-20 NOTE — Telephone Encounter (Signed)
Daughter is aware.  She would also like to know if trazadone can be increased because pt is not sleeping well or at all some nights.  States that she didn't sleep much at all last night.  Please advise. [name],CMA

## 2013-08-20 NOTE — Telephone Encounter (Signed)
I called the prescription in to her pharmacy. Please inform the daughter. Thanks.

## 2013-08-22 ENCOUNTER — Ambulatory Visit (INDEPENDENT_AMBULATORY_CARE_PROVIDER_SITE_OTHER): Payer: Medicare Other | Admitting: Family Medicine

## 2013-08-22 ENCOUNTER — Encounter: Payer: Self-pay | Admitting: Family Medicine

## 2013-08-22 ENCOUNTER — Other Ambulatory Visit: Payer: Self-pay | Admitting: *Deleted

## 2013-08-22 ENCOUNTER — Telehealth: Payer: Self-pay | Admitting: *Deleted

## 2013-08-22 VITALS — BP 144/68 | HR 72 | Temp 97.5°F | Wt 143.0 lb

## 2013-08-22 DIAGNOSIS — K5900 Constipation, unspecified: Secondary | ICD-10-CM

## 2013-08-22 DIAGNOSIS — L309 Dermatitis, unspecified: Secondary | ICD-10-CM

## 2013-08-22 DIAGNOSIS — I10 Essential (primary) hypertension: Secondary | ICD-10-CM | POA: Diagnosis not present

## 2013-08-22 DIAGNOSIS — I6350 Cerebral infarction due to unspecified occlusion or stenosis of unspecified cerebral artery: Secondary | ICD-10-CM | POA: Diagnosis not present

## 2013-08-22 DIAGNOSIS — E871 Hypo-osmolality and hyponatremia: Secondary | ICD-10-CM | POA: Diagnosis not present

## 2013-08-22 DIAGNOSIS — L259 Unspecified contact dermatitis, unspecified cause: Secondary | ICD-10-CM | POA: Diagnosis not present

## 2013-08-22 LAB — BASIC METABOLIC PANEL
BUN: 40 mg/dL — AB (ref 6–23)
CALCIUM: 9.2 mg/dL (ref 8.4–10.5)
CO2: 29 meq/L (ref 19–32)
CREATININE: 1.2 mg/dL — AB (ref 0.50–1.10)
Chloride: 97 mEq/L (ref 96–112)
GLUCOSE: 113 mg/dL — AB (ref 70–99)
Potassium: 4 mEq/L (ref 3.5–5.3)
Sodium: 135 mEq/L (ref 135–145)

## 2013-08-22 MED ORDER — HYDROCORTISONE 1 % EX CREA: 1.0000 "application " | TOPICAL_CREAM | Freq: Two times a day (BID) | CUTANEOUS | Status: DC

## 2013-08-22 MED ORDER — DOCUSATE SODIUM 100 MG PO CAPS: 100.0000 mg | ORAL_CAPSULE | Freq: Two times a day (BID) | ORAL | Status: DC

## 2013-08-22 NOTE — Patient Instructions (Signed)
Hiponatremia  (Hyponatremia)  Hay hiponatremia cuando el nivel de sal (sodio) en la sangre es bajo. Cuando este nivel baja, las clulas toman agua extra y se (hinchan). Esta hinchazn puede ocurrir en todo el cuerpo. Puede afectar el cerebro y ser muy grave.  CUIDADOS EN EL HOGAR   Slo tome la medicacin segn las indicaciones.  Siga las instrucciones de la dieta que le han dado. Esto incluye la limitacin de la cantidad de lquido que tome.  Cumpla con los controles mdicos segn las indicaciones.  Evite el alcohol y las drogas. SOLICITE AYUDA DE INMEDIATO SI:   Comienza a sacudirse o a temblar (sufre convulsiones).  Se desmaya(se desvanece).  Continua con diarrea o vmitos.  Tiene Higher education careers adviser (nuseas).  Se siente cansado (fatigado), tiene dolor de cabeza, se siente confundido o dbil.  Los problemas que motivaron la consulta aparecen nuevamente.  Tiene problemas para seguir la dieta que le han indicado. ASEGRESE DE QUE:   Comprende estas instrucciones.  Controlar su enfermedad.  Solicitar ayuda de inmediato si no mejora o si empeora. Document Released: 12/29/2010 Document Revised: 10/18/2011 [facility] Patient Information 2014 [place], [place].

## 2013-08-22 NOTE — Progress Notes (Signed)
Subjective:     Patient ID: Jordan [name], female   DOB: 1920/03/18, 80 y.o.   MRN: [id number]  HPI HTN: Here for follow up, her diuretic was recently d/c due to hyponatremia, she is now only on Verapamil 40 mg PO tid, feels well otherwise. Hyponatremia:Denies any concern,no weakness,here for follow up. Constipation: C/O reduced bowel movement, last BM was 2 days ago, her stool is well formed,not too hard, denies blood in her stool,no N/V,denies abdominal pain. She uses Miralax with no major improvement,she had used colace in the past but does not have refill for it. Rash: C/O reddish rash on her left breast which started 3 days ago, rash is slightly itchy and pinish,denies pain. She denies insect bite, no change in diet,no change in body lotion or soap, the rash is not spreading,remains same for the last 3 days.  Current Outpatient Prescriptions on File Prior to Visit  Medication Sig Dispense Refill  . aspirin 81 MG tablet Take 81 mg by mouth daily.      . Calcium Carbonate-Vitamin D (CALTRATE 600+D) 600-400 MG-UNIT per tablet Take 1 tablet by mouth 2 (two) times daily.        . diclofenac sodium (VOLTAREN) 1 % GEL Apply 2 g topically 4 (four) times daily.  10 Tube  1  . Glucosamine-Chondroitin-Vit D3 1500-1200-800 MG-MG-UNIT PACK Take 1 tablet by mouth 2 (two) times daily.        Marland Kitchen levothyroxine (SYNTHROID, LEVOTHROID) 112 MCG tablet take 1 tablet by mouth once daily  90 tablet  0  . lidocaine (LIDODERM) 5 % APPLY 1 PATCH TO SKIN DAILY. MAY CUT PATCH AND USE 1/4 OR 1/2 OF PATCH INSTEAD  30 patch  0  . polyethylene glycol (MIRALAX) packet Take 17 g by mouth daily.  14 each  0  . RESTASIS 0.05 % ophthalmic emulsion Place 1 drop into both eyes daily.       . traMADol (ULTRAM) 50 MG tablet Take 0.5 tablets (25 mg total) by mouth every 12 (twelve) hours as needed.  30 tablet  2  . TRAVATAN Z 0.004 % SOLN ophthalmic solution Place 1 drop into both eyes at bedtime.       . traZODone (DESYREL) 50 MG  tablet Take 0.5 tablets (25 mg total) by mouth at bedtime as needed for sleep.  30 tablet  3  . trospium (SANCTURA) 20 MG tablet Take 20 mg by mouth daily.       . verapamil (CALAN) 80 MG tablet Take 0.5 tablets (40 mg total) by mouth 3 (three) times daily.  90 tablet  2  . cephALEXin (KEFLEX) 250 MG capsule Take 1 capsule (250 mg total) by mouth 4 (four) times daily.  27 capsule  0  . ondansetron (ZOFRAN) 4 MG tablet Take 1 tablet (4 mg total) by mouth every 8 (eight) hours as needed for nausea or vomiting.  20 tablet  0   No current facility-administered medications on file prior to visit.   Past Medical History  Diagnosis Date  . Lung mass     right apical need CT in end of 2012  . Cervicalgia   . Peripheral neuropathy   . Frailty   . Hypertension   . Hypothyroid   . Osteoporosis   . Insomnia   . Arthritis   . Headache     Review of Systems  Respiratory: Negative.   Cardiovascular: Negative.   Gastrointestinal: Positive for constipation. Negative for nausea, diarrhea, blood in stool and  rectal pain.  Genitourinary: Negative.   Neurological: Negative.   All other systems reviewed and are negative.  Filed Vitals:   08/22/13 1543  BP: 144/68  Pulse: 72  Temp: 97.5 F (36.4 C)  TempSrc: Oral  Weight: 143 lb (64.864 kg)       Objective:   Physical Exam  Nursing note and vitals reviewed. Constitutional: She appears well-developed. No distress.  Cardiovascular: Normal rate, regular rhythm and normal heart sounds.   No murmur heard. Pulmonary/Chest: Effort normal and breath sounds normal. No respiratory distress. She has no wheezes.  Abdominal: Soft. Bowel sounds are normal. She exhibits no distension and no mass. There is no tenderness.  Musculoskeletal: Normal range of motion. She exhibits no edema.  Skin: Rash noted. Rash is macular. Rash is not papular and not vesicular.          Assessment:     HTN Hyponatremia Constipation Dermatitis     Plan:       Check problem list.

## 2013-08-22 NOTE — Telephone Encounter (Signed)
Patient walked in wanting to see Dr Lovena Le for a rash.  I let them know Dr Lovena Le was not here and they should contact her PCP.  If the PCP felt the rash was cardiac related or cardiac medication related he would contact Dr Lovena Le for advise

## 2013-08-23 ENCOUNTER — Telehealth: Payer: Self-pay | Admitting: Family Medicine

## 2013-08-23 DIAGNOSIS — K5900 Constipation, unspecified: Secondary | ICD-10-CM | POA: Insufficient documentation

## 2013-08-23 DIAGNOSIS — L309 Dermatitis, unspecified: Secondary | ICD-10-CM | POA: Insufficient documentation

## 2013-08-23 DIAGNOSIS — E871 Hypo-osmolality and hyponatremia: Secondary | ICD-10-CM | POA: Insufficient documentation

## 2013-08-23 NOTE — Assessment & Plan Note (Signed)
BP looks ok with slightly low systolic BP. May continue Verapamil at 40mg  for now. Continue home BP measurement, and RTC soon to if BP remains constantly low for medication adjustment. Return precaution dicussed with her son who accompanied her.

## 2013-08-23 NOTE — Assessment & Plan Note (Signed)
Likely contact dermatitis. Hydrocortisone topical cream prescribed. Advised to f/u in 2wks for reassessment. She verbalized understanding and agreed with plan.

## 2013-08-23 NOTE — Assessment & Plan Note (Signed)
Sodium level slightly low likely secondary to diuretic which has been held for about a week now. BMP checked today. I will contact her or her son with result.

## 2013-08-23 NOTE — Assessment & Plan Note (Signed)
I recommended increase fiber diet. I refilled her Colace. Advised to continue Miralax as needed. I recommended f/u with PCP in 2 wks or sooner if no improvement. Her son agreed to bring her back for follow up.

## 2013-08-23 NOTE — Telephone Encounter (Signed)
Patient wanted son to be contacted for test result as she does not understand english.I called and spoke with patient's son, her sodium level is normal, BUN/Cr ratio elevated suggestive of dehydration, I suggested she encourage increase hydration. He is also reminded to schedule follow up with her PCP to reassess her rash and constipation. He verbalized understanding and agreed with plan.

## 2013-08-26 ENCOUNTER — Telehealth: Payer: Self-pay | Admitting: Family Medicine

## 2013-08-26 ENCOUNTER — Telehealth: Payer: Self-pay | Admitting: Internal Medicine

## 2013-08-26 NOTE — Telephone Encounter (Signed)
New Message:  Pt's daughter states the area where the loop monitor was placed appears red and irritated.. Also, states her mom has been very tired and exhausted and not even having energy to speak... She is asking if there are any differences in the readings from the loop recorder.

## 2013-08-26 NOTE — Telephone Encounter (Signed)
Daughter called and would like to know about the last visit her mother had. She was under the impression that her mother was going to get a BP monitor, that is going to help with the monitoring of her mothers BP. She felt that the doctor that day was unaware that her mother needed one. Please call [name] at [phone] to discuss jw

## 2013-08-26 NOTE — Telephone Encounter (Signed)
Patient needed to be scheduled in pharmacy clinic with Dr Valentina Lucks for the 24 hour blood pressure monitor. They should have scheduled this after her last visit with me. With regards to discussing her last visit and if her daughter has questions, they should direct those to Dr Gwendlyn Deutscher.

## 2013-08-26 NOTE — Telephone Encounter (Signed)
Pt saw Dr. Gwendlyn Deutscher at her last visit.

## 2013-08-26 NOTE — Telephone Encounter (Signed)
Spoke with daughter Stasia Cavalier and schedule pt to come in tomorrow for 24 BP monitor.  Will forward this note to Dr. Valentina Lucks so that he is aware.  [name],CMA

## 2013-08-27 ENCOUNTER — Encounter (HOSPITAL_BASED_OUTPATIENT_CLINIC_OR_DEPARTMENT_OTHER): Payer: Self-pay | Admitting: *Deleted

## 2013-08-27 ENCOUNTER — Ambulatory Visit (INDEPENDENT_AMBULATORY_CARE_PROVIDER_SITE_OTHER): Payer: Medicare Other | Admitting: *Deleted

## 2013-08-27 ENCOUNTER — Ambulatory Visit (INDEPENDENT_AMBULATORY_CARE_PROVIDER_SITE_OTHER): Payer: Medicare Other | Admitting: Pharmacist

## 2013-08-27 VITALS — BP 179/64 | Ht 59.0 in | Wt 144.0 lb

## 2013-08-27 DIAGNOSIS — I6350 Cerebral infarction due to unspecified occlusion or stenosis of unspecified cerebral artery: Secondary | ICD-10-CM

## 2013-08-27 DIAGNOSIS — I10 Essential (primary) hypertension: Secondary | ICD-10-CM

## 2013-08-27 DIAGNOSIS — R55 Syncope and collapse: Secondary | ICD-10-CM

## 2013-08-27 LAB — MDC_IDC_ENUM_SESS_TYPE_INCLINIC

## 2013-08-27 NOTE — Progress Notes (Signed)
Pt seen for exhaustion,fatigue, and rash at site. GT reviewed strip of brady episode 08-26-13 around 1724 with times of HR below 30. Rash was also evaluated by GT with no concern. Pt's daughter instructed to call if any syncopal episodes.

## 2013-08-27 NOTE — Progress Notes (Signed)
S:    Patient arrives in a wheelchair accompanied by her daughter. She presents to the clinic for ambulatory blood pressure evaluation. She has long standing hypertension. Her physician discontinued chlorthalidone and amlodipine. Medication compliance is reported to be good. Her daughter reports a high systolic and a low diastolic pressure.   Discussed procedure for wearing the monitor and gave patient written instructions. Monitor was placed on non-dominant arm with instructions to return tomorrow afternoon.   Current BP Medications include:  Verapamil 80 mg- 0.5 tablet (40 mg) three times a day.  Antihypertensives tried in the past include: chlorthalidone (D/C due to hyponatremia), amlodipine (D/C due to edema)  Patient monitor returned to the clinic 4/29 without any issues reported.   O:  Last 3 Office BP readings: 144/68 mmHg 181/50 mmHg 132/46 mmHg  Today's Office BP reading: 179/64 mmHg (manual reading)  ABPM Study Data: Arm Placement left arm   Overall Mean 24hr BP:   172/58 mmHg HR: 38   Daytime Mean BP:  184/62 mmHg HR: 40   Nighttime Mean BP:  153/52 mmHg HR: 35   Dipping Pattern: yes  Sys:   17.2%   Dia: 17.0%   [normal dipping ~10-20%]   Non-hypertensive ABPM thresholds: daytime BP <135/85 mmHg, sleeptime BP <120/70 mmHg NICE Hypertension Guidelines ([place]) using ABPM: Stage I: >135/85 mmHg, Stage 2: >150/95 mmHg)   BMET    Component Value Date/Time   NA 135 08/22/2013 1613   K 4.0 08/22/2013 1613   CL 97 08/22/2013 1613   CO2 29 08/22/2013 1613   GLUCOSE 113* 08/22/2013 1613   BUN 40* 08/22/2013 1613   CREATININE 1.20* 08/22/2013 1613   CREATININE 1.40* 08/07/2013 2102   CALCIUM 9.2 08/22/2013 1613   GFRNONAA 44* 02/11/2013 0430   GFRAA 51* 02/11/2013 0430    A/P: History of longstanding hypertension found to have isolated systolic hypertension with bradycardia given 24-hour ambulatory blood pressure demonstrates average blood pressure of 172/58 mmHg, and a  nocturnal dipping pattern that is normal.  Heart rate profoundly bradycardic at 38 bpm. Changes to medications:  Discussed with orthopedic team given her pending procedure mid-day 4/30.   Agreed to stop verapamil today and post operative manage with amlodipine at low dose and reassess peripheral edema.  Alternatively, may consider clonidine. Results reviewed and written information provided.   F/U Clinic Visit with Dr. Caryl Bis, MD.  Total time in face-to-face counseling 30 minutes.  Patient seen with Toribio Harbour, PharmD Candidate, Silas Sacramento, PharmD Candidate and Wilfred Curtis, PharmD Resident.

## 2013-08-27 NOTE — Telephone Encounter (Signed)
Pt scheduled for device check and wound check 08-27-13 @ 1500.

## 2013-08-28 ENCOUNTER — Telehealth: Payer: Self-pay | Admitting: Pharmacist

## 2013-08-28 ENCOUNTER — Telehealth: Payer: Self-pay | Admitting: Family Medicine

## 2013-08-28 ENCOUNTER — Encounter (HOSPITAL_BASED_OUTPATIENT_CLINIC_OR_DEPARTMENT_OTHER): Payer: Self-pay | Admitting: *Deleted

## 2013-08-28 NOTE — Patient Instructions (Signed)
Post Amb BP monitoring plan discussed with surgical team PA from [place].   Plan to stop verapamil, and post surgery consider restart of low dose amlodipine vs alternative agent (possibly clonidine).

## 2013-08-28 NOTE — Progress Notes (Signed)
SPOKE W/ DAUGHTER, Jordan RUALES,  WHOM WILL INTERPRET FOR PT.  PT SPEAKS SPANISH.  NPO AFTER MN. ARRIVE AT 1015.  NEEDS ISTAT.  CURRENT EKG AND CXR IN EPIC AND CHART. WILL TAKE CALAN, SYNTHROID, AND SANCTURA AM DOS W/ SIPS OF WATER.  PT NORMALLY WALKS WITH WALKER BUT DUE TO RIGHT SHOULDER USING W/C.  ALTHOUGH PT  HAS DEMENTIA IS ABLE TO EXPRESS NEEDS BUT HAS POOR MEMORY.  DAUGHTER, Jordan,  HAS HPOA .  Jordan IS GOING TO FAX PT'S LIVING WILL AND HPOA TODAY.

## 2013-08-28 NOTE — Assessment & Plan Note (Signed)
History of longstanding hypertension found to have isolated systolic hypertension with bradycardia given 24-hour ambulatory blood pressure demonstrates average blood pressure of 172/58 mmHg, and a nocturnal dipping pattern that is normal.  Heart rate profoundly bradycardic at 38 bpm. Changes to medications:  Discussed with orthopedic team given her pending procedure mid-day 4/30.   Agreed to stop verapamil today and post operative manage with amlodipine at low dose and reassess peripheral edema.  Alternatively, may consider clonidine. Results reviewed and written information provided.   F/U Clinic Visit with Dr. Caryl Bis, MD.  Total time in face-to-face counseling 30 minutes.  Patient seen with Toribio Harbour, PharmD Candidate, Silas Sacramento, PharmD Candidate and Wilfred Curtis, PharmD Resident.

## 2013-08-28 NOTE — Telephone Encounter (Signed)
Spoke with daughter regarding BP monitoring and she just wants Dr. Valentina Lucks to call with results of patient's bp monitoring.  Daughter [name]'s husband will drop off machine today.  Eulalah Rupert,CMA

## 2013-08-28 NOTE — Telephone Encounter (Signed)
Please call her daughter with the results

## 2013-08-29 ENCOUNTER — Inpatient Hospital Stay (HOSPITAL_COMMUNITY)
Admission: EM | Admit: 2013-08-29 | Discharge: 2013-09-04 | DRG: 242 | Disposition: A | Discharge status: Skilled Nursing Facility | Payer: Medicare Other | Attending: Internal Medicine | Admitting: Internal Medicine

## 2013-08-29 ENCOUNTER — Ambulatory Visit (HOSPITAL_COMMUNITY): Admit: 2013-08-29 | Discharge: 2013-08-29 | Disposition: A | Discharge status: Home / Self Care | Payer: Medicare Other

## 2013-08-29 ENCOUNTER — Telehealth: Payer: Self-pay | Admitting: Pharmacist

## 2013-08-29 ENCOUNTER — Encounter (HOSPITAL_COMMUNITY): Payer: Self-pay | Admitting: Emergency Medicine

## 2013-08-29 ENCOUNTER — Emergency Department (HOSPITAL_COMMUNITY): Payer: Medicare Other

## 2013-08-29 ENCOUNTER — Telehealth: Payer: Self-pay | Admitting: *Deleted

## 2013-08-29 ENCOUNTER — Ambulatory Visit (HOSPITAL_BASED_OUTPATIENT_CLINIC_OR_DEPARTMENT_OTHER): Admission: RE | Admit: 2013-08-29 | Payer: Medicare Other | Source: Ambulatory Visit | Admitting: Specialist

## 2013-08-29 ENCOUNTER — Other Ambulatory Visit: Payer: Self-pay

## 2013-08-29 ENCOUNTER — Telehealth: Payer: Self-pay | Admitting: Internal Medicine

## 2013-08-29 ENCOUNTER — Ambulatory Visit (INDEPENDENT_AMBULATORY_CARE_PROVIDER_SITE_OTHER): Payer: Medicare Other | Admitting: Sports Medicine

## 2013-08-29 ENCOUNTER — Encounter: Payer: Self-pay | Admitting: Pharmacist

## 2013-08-29 ENCOUNTER — Encounter: Payer: Self-pay | Admitting: Sports Medicine

## 2013-08-29 VITALS — BP 176/40 | HR 42 | Temp 99.8°F

## 2013-08-29 DIAGNOSIS — I10 Essential (primary) hypertension: Secondary | ICD-10-CM | POA: Diagnosis present

## 2013-08-29 DIAGNOSIS — G309 Alzheimer's disease, unspecified: Secondary | ICD-10-CM | POA: Diagnosis not present

## 2013-08-29 DIAGNOSIS — J4489 Other specified chronic obstructive pulmonary disease: Secondary | ICD-10-CM | POA: Diagnosis present

## 2013-08-29 DIAGNOSIS — N1832 Chronic kidney disease, stage 3b: Secondary | ICD-10-CM

## 2013-08-29 DIAGNOSIS — Z9849 Cataract extraction status, unspecified eye: Secondary | ICD-10-CM | POA: Diagnosis not present

## 2013-08-29 DIAGNOSIS — R269 Unspecified abnormalities of gait and mobility: Secondary | ICD-10-CM | POA: Diagnosis not present

## 2013-08-29 DIAGNOSIS — Z79899 Other long term (current) drug therapy: Secondary | ICD-10-CM

## 2013-08-29 DIAGNOSIS — H9190 Unspecified hearing loss, unspecified ear: Secondary | ICD-10-CM | POA: Diagnosis present

## 2013-08-29 DIAGNOSIS — I441 Atrioventricular block, second degree: Secondary | ICD-10-CM

## 2013-08-29 DIAGNOSIS — L0390 Cellulitis, unspecified: Secondary | ICD-10-CM

## 2013-08-29 DIAGNOSIS — I517 Cardiomegaly: Secondary | ICD-10-CM | POA: Diagnosis not present

## 2013-08-29 DIAGNOSIS — N189 Chronic kidney disease, unspecified: Secondary | ICD-10-CM | POA: Diagnosis not present

## 2013-08-29 DIAGNOSIS — Z8673 Personal history of transient ischemic attack (TIA), and cerebral infarction without residual deficits: Secondary | ICD-10-CM | POA: Diagnosis not present

## 2013-08-29 DIAGNOSIS — E039 Hypothyroidism, unspecified: Secondary | ICD-10-CM | POA: Diagnosis present

## 2013-08-29 DIAGNOSIS — Z5189 Encounter for other specified aftercare: Secondary | ICD-10-CM | POA: Diagnosis not present

## 2013-08-29 DIAGNOSIS — N183 Chronic kidney disease, stage 3 unspecified: Secondary | ICD-10-CM | POA: Diagnosis present

## 2013-08-29 DIAGNOSIS — M19079 Primary osteoarthritis, unspecified ankle and foot: Secondary | ICD-10-CM

## 2013-08-29 DIAGNOSIS — I440 Atrioventricular block, first degree: Secondary | ICD-10-CM | POA: Diagnosis not present

## 2013-08-29 DIAGNOSIS — R55 Syncope and collapse: Secondary | ICD-10-CM | POA: Diagnosis present

## 2013-08-29 DIAGNOSIS — G609 Hereditary and idiopathic neuropathy, unspecified: Secondary | ICD-10-CM | POA: Diagnosis present

## 2013-08-29 DIAGNOSIS — R531 Weakness: Secondary | ICD-10-CM

## 2013-08-29 DIAGNOSIS — M810 Age-related osteoporosis without current pathological fracture: Secondary | ICD-10-CM | POA: Diagnosis present

## 2013-08-29 DIAGNOSIS — I129 Hypertensive chronic kidney disease with stage 1 through stage 4 chronic kidney disease, or unspecified chronic kidney disease: Secondary | ICD-10-CM | POA: Diagnosis present

## 2013-08-29 DIAGNOSIS — M12879 Other specific arthropathies, not elsewhere classified, unspecified ankle and foot: Secondary | ICD-10-CM | POA: Diagnosis not present

## 2013-08-29 DIAGNOSIS — M19019 Primary osteoarthritis, unspecified shoulder: Secondary | ICD-10-CM | POA: Diagnosis present

## 2013-08-29 DIAGNOSIS — I509 Heart failure, unspecified: Secondary | ICD-10-CM | POA: Diagnosis not present

## 2013-08-29 DIAGNOSIS — I5033 Acute on chronic diastolic (congestive) heart failure: Secondary | ICD-10-CM | POA: Diagnosis present

## 2013-08-29 DIAGNOSIS — H409 Unspecified glaucoma: Secondary | ICD-10-CM | POA: Diagnosis present

## 2013-08-29 DIAGNOSIS — Z7982 Long term (current) use of aspirin: Secondary | ICD-10-CM

## 2013-08-29 DIAGNOSIS — R001 Bradycardia, unspecified: Secondary | ICD-10-CM

## 2013-08-29 DIAGNOSIS — M109 Gout, unspecified: Secondary | ICD-10-CM | POA: Diagnosis present

## 2013-08-29 DIAGNOSIS — I498 Other specified cardiac arrhythmias: Secondary | ICD-10-CM

## 2013-08-29 DIAGNOSIS — I499 Cardiac arrhythmia, unspecified: Secondary | ICD-10-CM | POA: Diagnosis not present

## 2013-08-29 DIAGNOSIS — J984 Other disorders of lung: Secondary | ICD-10-CM | POA: Diagnosis not present

## 2013-08-29 DIAGNOSIS — S99919A Unspecified injury of unspecified ankle, initial encounter: Secondary | ICD-10-CM | POA: Diagnosis not present

## 2013-08-29 DIAGNOSIS — M7989 Other specified soft tissue disorders: Secondary | ICD-10-CM | POA: Diagnosis not present

## 2013-08-29 DIAGNOSIS — J449 Chronic obstructive pulmonary disease, unspecified: Secondary | ICD-10-CM | POA: Diagnosis not present

## 2013-08-29 DIAGNOSIS — E871 Hypo-osmolality and hyponatremia: Secondary | ICD-10-CM | POA: Diagnosis present

## 2013-08-29 DIAGNOSIS — M79609 Pain in unspecified limb: Secondary | ICD-10-CM | POA: Diagnosis not present

## 2013-08-29 DIAGNOSIS — I5031 Acute diastolic (congestive) heart failure: Secondary | ICD-10-CM

## 2013-08-29 DIAGNOSIS — M6281 Muscle weakness (generalized): Secondary | ICD-10-CM | POA: Diagnosis not present

## 2013-08-29 DIAGNOSIS — R0602 Shortness of breath: Secondary | ICD-10-CM | POA: Diagnosis not present

## 2013-08-29 DIAGNOSIS — M25579 Pain in unspecified ankle and joints of unspecified foot: Secondary | ICD-10-CM | POA: Diagnosis not present

## 2013-08-29 DIAGNOSIS — R5381 Other malaise: Secondary | ICD-10-CM | POA: Diagnosis not present

## 2013-08-29 DIAGNOSIS — F0280 Dementia in other diseases classified elsewhere without behavioral disturbance: Secondary | ICD-10-CM | POA: Diagnosis not present

## 2013-08-29 DIAGNOSIS — R54 Age-related physical debility: Secondary | ICD-10-CM | POA: Diagnosis present

## 2013-08-29 DIAGNOSIS — L0291 Cutaneous abscess, unspecified: Secondary | ICD-10-CM

## 2013-08-29 DIAGNOSIS — I5032 Chronic diastolic (congestive) heart failure: Secondary | ICD-10-CM | POA: Diagnosis present

## 2013-08-29 DIAGNOSIS — G4700 Insomnia, unspecified: Secondary | ICD-10-CM | POA: Diagnosis present

## 2013-08-29 DIAGNOSIS — S8990XA Unspecified injury of unspecified lower leg, initial encounter: Secondary | ICD-10-CM | POA: Diagnosis not present

## 2013-08-29 DIAGNOSIS — I495 Sick sinus syndrome: Secondary | ICD-10-CM | POA: Diagnosis not present

## 2013-08-29 HISTORY — DX: Atrioventricular block, second degree: I44.1

## 2013-08-29 HISTORY — DX: Hypothyroidism, unspecified: E03.9

## 2013-08-29 HISTORY — DX: Dry eye syndrome of bilateral lacrimal glands: H04.123

## 2013-08-29 HISTORY — DX: Unspecified osteoarthritis, unspecified site: M19.90

## 2013-08-29 HISTORY — DX: Unspecified glaucoma: H40.9

## 2013-08-29 HISTORY — DX: Unsteadiness on feet: R26.81

## 2013-08-29 HISTORY — DX: Other constipation: K59.09

## 2013-08-29 HISTORY — DX: Unspecified dementia, moderate, without behavioral disturbance, psychotic disturbance, mood disturbance, and anxiety: F03.B0

## 2013-08-29 HISTORY — DX: Spinal stenosis, lumbar region without neurogenic claudication: M48.061

## 2013-08-29 HISTORY — DX: Weakness: R53.1

## 2013-08-29 HISTORY — DX: Personal history of colon polyps, unspecified: Z86.0100

## 2013-08-29 HISTORY — DX: Frequency of micturition: R35.0

## 2013-08-29 HISTORY — DX: Unspecified hearing loss, bilateral: H91.93

## 2013-08-29 HISTORY — DX: Diverticulosis of large intestine without perforation or abscess without bleeding: K57.30

## 2013-08-29 HISTORY — DX: Personal history of colonic polyps: Z86.010

## 2013-08-29 HISTORY — DX: Unspecified dementia without behavioral disturbance: F03.90

## 2013-08-29 HISTORY — DX: Cerebral infarction, unspecified: I63.9

## 2013-08-29 HISTORY — DX: Stress incontinence (female) (male): N39.3

## 2013-08-29 HISTORY — DX: Unspecified rotator cuff tear or rupture of right shoulder, not specified as traumatic: M75.101

## 2013-08-29 LAB — URINALYSIS, ROUTINE W REFLEX MICROSCOPIC
Bilirubin Urine: NEGATIVE
Glucose, UA: NEGATIVE mg/dL
Ketones, ur: NEGATIVE mg/dL
Nitrite: NEGATIVE
Protein, ur: 100 mg/dL — AB
Specific Gravity, Urine: 1.015 (ref 1.005–1.030)
Urobilinogen, UA: 0.2 mg/dL (ref 0.0–1.0)
pH: 5.5 (ref 5.0–8.0)

## 2013-08-29 LAB — BASIC METABOLIC PANEL
BUN: 33 mg/dL — AB (ref 6–23)
CHLORIDE: 98 meq/L (ref 96–112)
CO2: 25 mEq/L (ref 19–32)
CREATININE: 1.31 mg/dL — AB (ref 0.50–1.10)
Calcium: 8.8 mg/dL (ref 8.4–10.5)
GFR calc Af Amer: 40 mL/min — ABNORMAL LOW (ref >90)
GFR calc non Af Amer: 34 mL/min — ABNORMAL LOW (ref >90)
Glucose, Bld: 105 mg/dL — ABNORMAL HIGH (ref 70–99)
Potassium: 5.1 mEq/L (ref 3.7–5.3)
Sodium: 134 mEq/L — ABNORMAL LOW (ref 137–147)

## 2013-08-29 LAB — MRSA PCR SCREENING: MRSA by PCR: NEGATIVE

## 2013-08-29 LAB — CBC WITH DIFFERENTIAL/PLATELET
BASOS PCT: 0 % (ref 0–1)
Basophils Absolute: 0 10*3/uL (ref 0.0–0.1)
Eosinophils Absolute: 0 10*3/uL (ref 0.0–0.7)
Eosinophils Relative: 0 % (ref 0–5)
HEMATOCRIT: 31.8 % — AB (ref 36.0–46.0)
Hemoglobin: 10.4 g/dL — ABNORMAL LOW (ref 12.0–15.0)
Lymphocytes Relative: 14 % (ref 12–46)
Lymphs Abs: 1.5 10*3/uL (ref 0.7–4.0)
MCH: 32 pg (ref 26.0–34.0)
MCHC: 32.7 g/dL (ref 30.0–36.0)
MCV: 97.8 fL (ref 78.0–100.0)
MONO ABS: 1.2 10*3/uL — AB (ref 0.1–1.0)
MONOS PCT: 11 % (ref 3–12)
Neutro Abs: 8.3 10*3/uL — ABNORMAL HIGH (ref 1.7–7.7)
Neutrophils Relative %: 75 % (ref 43–77)
Platelets: 181 10*3/uL (ref 150–400)
RBC: 3.25 MIL/uL — ABNORMAL LOW (ref 3.87–5.11)
RDW: 13.4 % (ref 11.5–15.5)
WBC: 11.1 10*3/uL — ABNORMAL HIGH (ref 4.0–10.5)

## 2013-08-29 LAB — URINE MICROSCOPIC-ADD ON

## 2013-08-29 LAB — APTT: APTT: 32 s (ref 24–37)

## 2013-08-29 LAB — MAGNESIUM: Magnesium: 2.3 mg/dL (ref 1.5–2.5)

## 2013-08-29 LAB — PROTIME-INR
INR: 1.06 (ref 0.00–1.49)
Prothrombin Time: 13.6 seconds (ref 11.6–15.2)

## 2013-08-29 LAB — TROPONIN I: Troponin I: <0.3 ng/mL (ref <0.30)

## 2013-08-29 SURGERY — SHOULDER ARTHROSCOPY WITH SUBACROMIAL DECOMPRESSION AND OPEN ROTATOR CUFF REPAIR, OPEN BICEPS TENDON REPAIR
Anesthesia: General | Site: Shoulder | Laterality: Right

## 2013-08-29 MED ORDER — DICLOFENAC SODIUM 1 % TD GEL
2.0000 g | Freq: Four times a day (QID) | TRANSDERMAL | Status: DC
  Administered 2013-08-29 – 2013-09-04 (×21): 2 g via TOPICAL
  Filled 2013-08-29: qty 100

## 2013-08-29 MED ORDER — ACETAMINOPHEN 325 MG PO TABS: 650.0000 mg | ORAL_TABLET | Freq: Four times a day (QID) | ORAL | Status: DC | PRN

## 2013-08-29 MED ORDER — POLYETHYLENE GLYCOL 3350 17 G PO PACK
17.0000 g | PACK | Freq: Every day | ORAL | Status: DC
  Administered 2013-08-30 – 2013-09-04 (×5): 17 g via ORAL
  Filled 2013-08-29 (×6): qty 1

## 2013-08-29 MED ORDER — CLONIDINE HCL 0.1 MG PO TABS: 0.1000 mg | ORAL_TABLET | Freq: Two times a day (BID) | ORAL | Status: DC

## 2013-08-29 MED ORDER — COLCHICINE 0.6 MG PO TABS
0.6000 mg | ORAL_TABLET | Freq: Two times a day (BID) | ORAL | Status: DC
  Administered 2013-08-29 – 2013-09-04 (×12): 0.6 mg via ORAL
  Filled 2013-08-29 (×13): qty 1

## 2013-08-29 MED ORDER — FUROSEMIDE 10 MG/ML IJ SOLN: 40.0000 mg | Freq: Once | INTRAMUSCULAR | Status: DC

## 2013-08-29 MED ORDER — FUROSEMIDE 10 MG/ML IJ SOLN
40.0000 mg | Freq: Once | INTRAMUSCULAR | Status: AC
  Administered 2013-08-29: 40 mg via INTRAVENOUS
  Filled 2013-08-29: qty 4

## 2013-08-29 MED ORDER — CALCIUM CARBONATE-VITAMIN D 500-200 MG-UNIT PO TABS
1.0000 | ORAL_TABLET | Freq: Two times a day (BID) | ORAL | Status: DC
  Administered 2013-08-29 – 2013-08-30 (×2): 1 via ORAL
  Filled 2013-08-29 (×3): qty 1

## 2013-08-29 MED ORDER — TRAMADOL HCL 50 MG PO TABS
25.0000 mg | ORAL_TABLET | Freq: Two times a day (BID) | ORAL | Status: DC | PRN
  Administered 2013-09-04: 25 mg via ORAL
  Filled 2013-08-29: qty 1

## 2013-08-29 MED ORDER — CALCIUM CARBONATE-VITAMIN D 600-400 MG-UNIT PO TABS: 1.0000 | ORAL_TABLET | Freq: Two times a day (BID) | ORAL | Status: DC

## 2013-08-29 MED ORDER — ACETAMINOPHEN 325 MG PO TABS: 650.0000 mg | ORAL_TABLET | ORAL | Status: DC | PRN

## 2013-08-29 MED ORDER — DOCUSATE SODIUM 100 MG PO CAPS
100.0000 mg | ORAL_CAPSULE | Freq: Two times a day (BID) | ORAL | Status: DC
  Administered 2013-08-30 – 2013-09-04 (×11): 100 mg via ORAL
  Filled 2013-08-29 (×13): qty 1

## 2013-08-29 MED ORDER — CEPHALEXIN 250 MG PO CAPS
250.0000 mg | ORAL_CAPSULE | Freq: Three times a day (TID) | ORAL | Status: DC
  Administered 2013-08-29 – 2013-08-30 (×3): 250 mg via ORAL
  Filled 2013-08-29 (×5): qty 1

## 2013-08-29 MED ORDER — HEPARIN SODIUM (PORCINE) 5000 UNIT/ML IJ SOLN
5000.0000 [IU] | Freq: Three times a day (TID) | INTRAMUSCULAR | Status: DC
  Administered 2013-08-29 – 2013-08-30 (×2): 5000 [IU] via SUBCUTANEOUS
  Filled 2013-08-29 (×5): qty 1

## 2013-08-29 MED ORDER — HYDRALAZINE HCL 25 MG PO TABS
25.0000 mg | ORAL_TABLET | Freq: Three times a day (TID) | ORAL | Status: DC
  Administered 2013-08-29 – 2013-09-04 (×17): 25 mg via ORAL
  Filled 2013-08-29 (×21): qty 1

## 2013-08-29 MED ORDER — LEVOTHYROXINE SODIUM 112 MCG PO TABS
112.0000 ug | ORAL_TABLET | Freq: Every day | ORAL | Status: DC
  Administered 2013-08-31: 112 ug via ORAL
  Filled 2013-08-29 (×3): qty 1

## 2013-08-29 MED ORDER — ONDANSETRON HCL 4 MG/2ML IJ SOLN: 4.0000 mg | Freq: Four times a day (QID) | INTRAMUSCULAR | Status: DC | PRN

## 2013-08-29 MED ORDER — ASPIRIN EC 81 MG PO TBEC
81.0000 mg | DELAYED_RELEASE_TABLET | Freq: Every day | ORAL | Status: DC
  Administered 2013-08-30: 81 mg via ORAL
  Filled 2013-08-29: qty 1

## 2013-08-29 MED ORDER — TRAZODONE 25 MG HALF TABLET
25.0000 mg | ORAL_TABLET | Freq: Every evening | ORAL | Status: DC | PRN
  Administered 2013-08-30: 25 mg via ORAL
  Administered 2013-09-01: 02:00:00 via ORAL
  Administered 2013-09-01 – 2013-09-03 (×3): 25 mg via ORAL
  Filled 2013-08-29 (×5): qty 1

## 2013-08-29 MED ORDER — CYCLOSPORINE 0.05 % OP EMUL
1.0000 [drp] | Freq: Every day | OPHTHALMIC | Status: DC
  Administered 2013-08-29 – 2013-09-04 (×7): 1 [drp] via OPHTHALMIC
  Filled 2013-08-29 (×7): qty 1

## 2013-08-29 MED ORDER — LATANOPROST 0.005 % OP SOLN
1.0000 [drp] | Freq: Every day | OPHTHALMIC | Status: DC
  Administered 2013-08-29 – 2013-09-03 (×6): 1 [drp] via OPHTHALMIC
  Filled 2013-08-29 (×2): qty 2.5

## 2013-08-29 MED ORDER — DARIFENACIN HYDROBROMIDE ER 7.5 MG PO TB24
7.5000 mg | ORAL_TABLET | Freq: Every day | ORAL | Status: DC
  Administered 2013-08-30 – 2013-09-04 (×6): 7.5 mg via ORAL
  Filled 2013-08-29 (×6): qty 1

## 2013-08-29 MED ORDER — NITROGLYCERIN 0.4 MG SL SUBL: 0.4000 mg | SUBLINGUAL_TABLET | SUBLINGUAL | Status: DC | PRN

## 2013-08-29 NOTE — Telephone Encounter (Signed)
New problem         Per pt's Daughter :    Around noon today pt was seeing gray and very weak, left leg and foot are very swollen there is a rash there as well. Pt's pulse in the 40's & BP to high.  Daughter could not remember specific numbers. Pt's rotator cuff surg was pospoed because of this.   Pt was told to call in when this happened.   Pt would like to know if she should come in.   If any questions please give pt a call.

## 2013-08-29 NOTE — Assessment & Plan Note (Signed)
Acute, critical condition - New EKG changes including a second-degree AV block with 21 AV conduction.  Cannot evaluate if this is a wing Bocker Mobitz type II.  Due to this she is to transfer to emergency department under supervision via EMS.  - She currently has a loop recorder in place. 1. Unclear etiology will need complete workup including evaluation for potential PE given left lower extremity swelling.  >50% of this 45 minute visit spent in direct patient counseling and/or coordination of care.

## 2013-08-29 NOTE — Telephone Encounter (Signed)
Called and left message with daughter and let her know Dr Lovena Le is here tomorrow and I can discuss with him tomorrow or she may take her to the ER tonight

## 2013-08-29 NOTE — ED Notes (Addendum)
PER EMS:  Patient was at family practice, transferred because of 2nd degree block (mobitz) rate of 40,  2:1, rales noted, no peripheral edema, BP: 180/50, a&or x 4, exertional dyspnea,  English limited, daughter at bedside.    Patient went to family practice today because family was concerned about patient having increased fatigue and decreased activity tolerance, has had episodes of syncope in the past, has had loop recorder placed.  She is usually mobile and able to ambulate but required wheelchair assistance due to fatigue, dizziness, shortness of breath. According to previous notes, this is a new onset AV block, patient has increased swelling in lower extremities and enlarged varicose veins according to daughter.  Patient reports slight sob, denies chest pain and dizziness at this time.

## 2013-08-29 NOTE — Telephone Encounter (Signed)
Surgery was cancelled.  Requested support for BP management.  Discussed verapamil switch to clonidine 0.1mg  BID.    New Rx prescription phoned into [place]

## 2013-08-29 NOTE — Telephone Encounter (Signed)
F/u   Previous message..Daughter stated heart rate if dropping at present

## 2013-08-29 NOTE — Telephone Encounter (Signed)
Daughter called and would like Dr. Caryl Bis to call her concerning her mother. Jordan [name]

## 2013-08-29 NOTE — ED Notes (Signed)
Dr. Karle Starch informed of patient's HR

## 2013-08-29 NOTE — Progress Notes (Signed)
Patient ID: Jordan [name], female   DOB: 1920-04-14, 80 y.o.   MRN: [id number] Reviewed: Agree with Dr. Graylin Shiver documentation and management.

## 2013-08-29 NOTE — ED Notes (Signed)
Crash cart at bedside 

## 2013-08-29 NOTE — ED Notes (Signed)
Dr. Karle Starch at bedside, family at bedside

## 2013-08-29 NOTE — Addendum Note (Signed)
Addended by: Leavy Cella on: 08/29/2013 09:18 AM   Modules accepted: Orders, Level of Service

## 2013-08-29 NOTE — Progress Notes (Addendum)
Jordan [name] - 80 y.o. female MRN [id number]  Date of birth: 1919-06-26  CC: low heart rate/fup HTN and CHECK BOTH LEGS/SWELLING   SUBJECTIVE:     HPI Comments: Patient presents with:   low heart rate/fup HTN   CHECK BOTH LEGS/SWELLING  Patient is a 80 year old female who has had a three-week overall decline who presents today for same-day visit do to persistently low heart rate and blood pressure.  She was scheduled for a rotator cuff repair today however due to the abnormality she's been having this is been canceled.  She was brought here today by her daughter and son who are concerned because they had difficulty getting her out of bed.  She is usually mobile and able to ambulate but required wheelchair assistance due to fatigue, dizziness, shortness of breath.  She is Spanish-speaking and her daughter interprets for her.  There is no reported chest pain.  She's been followed by Dr. Jens Som and has a complaint of a loop recorder in place at this time.  There've been been reported episodes of bradycardia but no high degree heart block noted.   There is also new onset left-sided swelling and enlarging varicosities.  She does have shortness of breath See problem based charting for additional problem specific subjective (including HPI, Interval History & ROS)   HISTORY:  History  Smoking status  . Never Smoker   Smokeless tobacco  . Never Used  Otherwise past Medical, Surgical, Social, and Family History Reviewed per EMR Medications and Allergies reviewed and updated per below.  OBJECTIVE: VITALS: BP: 176/40 mmHg  HR: 42 bpm  TEMP: 99.8 F (37.7 C) (Oral)  RESP: 96 %  HT:    WT:  (unable to stand/ts)  BMI:     BP Readings from Last 3 Encounters:  08/29/13 176/40  08/27/13 179/64  08/22/13 144/68   Wt Readings from Last 3 Encounters:  08/27/13 144 lb (65.318 kg)  08/22/13 143 lb (64.864 kg)  08/15/13 136 lb 11.2 oz (62.007 kg)     Physical Exam  Vitals  reviewed. Constitutional: No distress.  Sitting comfortably in a wheelchair.  HENT:  Head: Normocephalic and atraumatic.  Right Ear: External ear normal.  Left Ear: External ear normal.  Cardiovascular:  Bradycardic, distant heart sounds. EKG 4-30 with rhythm of 40, second degree AV block with 21 AV conduction.  ST  changes in the lateral leads.  Pulmonary/Chest: Effort normal. No respiratory distress. She has rales (By basilar).  Musculoskeletal: She exhibits edema (Trace) and tenderness (over left lateral leg, varicosity present).  Neurological: She is alert.  Moves all 4 extremities spontaneously; no lateralization.  Skin: Skin is warm and dry. She is not diaphoretic.  Psychiatric: Mood, memory, affect and judgment normal.   MEDICATIONS, LABS & OTHER ORDERS: Previous Medications   ACETAMINOPHEN (TYLENOL) 325 MG TABLET    Take 650 mg by mouth every 6 (six) hours as needed.   ASPIRIN 81 MG TABLET    Take 81 mg by mouth daily.   CALCIUM CARBONATE-VITAMIN D (CALTRATE 600+D) 600-400 MG-UNIT PER TABLET    Take 1 tablet by mouth 2 (two) times daily.     CLONIDINE (CATAPRES) 0.1 MG TABLET    Take 1 tablet (0.1 mg total) by mouth 2 (two) times daily.   DICLOFENAC SODIUM (VOLTAREN) 1 % GEL    Apply 2 g topically 4 (four) times daily.   DOCUSATE SODIUM (COLACE) 100 MG CAPSULE    Take 1 capsule (100 mg total) by  mouth 2 (two) times daily.   GLUCOSAMINE-CHONDROITIN-VIT D3 1500-1200-800 MG-MG-UNIT PACK    Take 1 tablet by mouth 2 (two) times daily.     HYDROCORTISONE CREAM 1 %    Apply 1 application topically 2 (two) times daily.   LEVOTHYROXINE (SYNTHROID, LEVOTHROID) 112 MCG TABLET    take 1 tablet by mouth once daily--  takes in am   LIDOCAINE (LIDODERM) 5 %    APPLY 1 PATCH TO SKIN DAILY. MAY CUT PATCH AND USE 1/4 OR 1/2 OF PATCH INSTEAD   ONDANSETRON (ZOFRAN) 4 MG TABLET    Take 1 tablet (4 mg total) by mouth every 8 (eight) hours as needed for nausea or vomiting.   POLYETHYLENE GLYCOL  (MIRALAX) PACKET    Take 17 g by mouth daily.   RESTASIS 0.05 % OPHTHALMIC EMULSION    Place 1 drop into both eyes daily.    TRAMADOL (ULTRAM) 50 MG TABLET    Take 0.5 tablets (25 mg total) by mouth every 12 (twelve) hours as needed.   TRAVATAN Z 0.004 % SOLN OPHTHALMIC SOLUTION    Place 1 drop into both eyes at bedtime.    TRAZODONE (DESYREL) 50 MG TABLET    Take 0.5 tablets (25 mg total) by mouth at bedtime as needed for sleep.   TROSPIUM (SANCTURA) 20 MG TABLET    Take 20 mg by mouth 2 (two) times daily.    Modified Medications   No medications on file   New Prescriptions   No medications on file   Discontinued Medications   No medications on file   Orders Placed This Encounter  Procedures  . EKG 12-Lead   ASSESSMENT & PLAN: See problem based charting & AVS for pt instructions.

## 2013-08-29 NOTE — ED Notes (Addendum)
Jordan [name], [place] and Dr. Ashok Croon, [facility] at bedside

## 2013-08-29 NOTE — Telephone Encounter (Signed)
Pt's daughter called with concerns of pt's low heart rate, increase blood pressure and visual changes.  Pt's surgery was cancelled today due to symptoms.  Pt's vision was grey per daughter.  Per daughter blood pressure reading today 132/40 152/50; heart rate 34, 42.  Pt advised to schedule an appt; appt today 08/29/2013 at 3:30 PM.  Derl Barrow, RN

## 2013-08-29 NOTE — ED Provider Notes (Signed)
CSN: [id number]     Arrival date & time 08/29/13  1758 History   First MD Initiated Contact with Patient 08/29/13 1805     No chief complaint on file.    (Consider location/radiation/quality/duration/timing/severity/associated sxs/prior Treatment) HPI Level 5 caveat due to language barrier, history provided by daughter at her request Pt was scheduled to have shoulder surgery today but cancelled due to low heart rate. Daughter reports it has been low for the last 10 days. Earlier today the patient had an episode of 'seeing grey'. This has since passed. She has had general weakness. No CP or SOB. She is wearing an implanted loop monitor since syncopal episode last fall. Per Daughter Cardiology reviewed monitor several days ago and felt she was fine for surgery.   Pt also has pain and swelling to LLE, increased size of varicose veins on lateral calf, erythema, induration and tenderness to L ankle. No trauma.   Past Medical History  Diagnosis Date  . Cervicalgia   . Peripheral neuropathy   . Hypertension   . Osteoporosis   . Insomnia   . Arthritis   . Headache   . Hypothyroidism   . Right rotator cuff tear   . OA (osteoarthritis)     RIGHT SHOULDER AC JOINT  . History of syncope     01/2013  DX  CYRPTOGENIC STROKE  . Moderate dementia   . Gait instability   . First degree heart block   . History of colon polyps   . Diverticulosis of colon   . SUI (stress urinary incontinence, female)   . Lumbar stenosis   . COPD (chronic obstructive pulmonary disease)   . Status post placement of implantable loop recorder     02-08-2013  . Cryptogenic stroke S/P  LOOP RECORDER IMPLANT    DX  01/2013  --  SYNCOPAL EPISODE --  EPISODIC  ATRIAL TACHY/ FIBULATION  AND PAUSES  . Frequency of urination   . Short of breath on exertion   . Chronic constipation   . Glaucoma of both eyes   . Hearing loss of both ears     no hearing aids  . Dry eyes    Past Surgical History  Procedure Laterality  Date  . Loop recorder implant  02-08-2013   DR  Cristopher Peru  . Total abdominal hysterectomy w/ bilateral salpingoophorectomy  03-15-2005   Family History  Problem Relation Age of Onset  . Kidney disease Mother    History  Substance Use Topics  . Smoking status: Never Smoker   . Smokeless tobacco: Never Used  . Alcohol Use: No   OB History   Grav Para Term Preterm Abortions TAB SAB Ect Mult Living                 Review of Systems Unable to assess due to language barrier     Allergies  Amlodipine; Chlorthalidone; Verapamil; Adhesive; and Honey bee treatment  Home Medications   Prior to Admission medications   Medication Sig Start Date End Date Taking? Authorizing Provider  acetaminophen (TYLENOL) 325 MG tablet Take 650 mg by mouth every 6 (six) hours as needed.    Historical Provider, MD  aspirin 81 MG tablet Take 81 mg by mouth daily.    Historical Provider, MD  Calcium Carbonate-Vitamin D (CALTRATE 600+D) 600-400 MG-UNIT per tablet Take 1 tablet by mouth 2 (two) times daily.      Historical Provider, MD  cloNIDine (CATAPRES) 0.1 MG tablet Take 1 tablet (0.1  mg total) by mouth 2 (two) times daily. 08/29/13   Zigmund Gottron, MD  diclofenac sodium (VOLTAREN) 1 % GEL Apply 2 g topically 4 (four) times daily. 09/23/12   Rachell Cipro, MD  docusate sodium (COLACE) 100 MG capsule Take 1 capsule (100 mg total) by mouth 2 (two) times daily. 08/22/13   Andrena Mews, MD  Glucosamine-Chondroitin-Vit D3 1500-1200-800 MG-MG-UNIT PACK Take 1 tablet by mouth 2 (two) times daily.      Historical Provider, MD  hydrocortisone cream 1 % Apply 1 application topically 2 (two) times daily. 08/22/13   Andrena Mews, MD  levothyroxine (SYNTHROID, LEVOTHROID) 112 MCG tablet take 1 tablet by mouth once daily--  takes in am    Leone Haven, MD  lidocaine (LIDODERM) 5 % APPLY 1 PATCH TO SKIN DAILY. MAY CUT PATCH AND USE 1/4 OR 1/2 OF PATCH INSTEAD 07/08/13   Leone Haven, MD   ondansetron (ZOFRAN) 4 MG tablet Take 1 tablet (4 mg total) by mouth every 8 (eight) hours as needed for nausea or vomiting. 08/15/13   Leone Haven, MD  polyethylene glycol [facility]) packet Take 17 g by mouth daily. 09/23/12   Rachell Cipro, MD  RESTASIS 0.05 % ophthalmic emulsion Place 1 drop into both eyes daily.  01/11/13   Historical Provider, MD  traMADol (ULTRAM) 50 MG tablet Take 0.5 tablets (25 mg total) by mouth every 12 (twelve) hours as needed. 08/20/13   Leone Haven, MD  TRAVATAN Z 0.004 % SOLN ophthalmic solution Place 1 drop into both eyes at bedtime.  02/20/13   Historical Provider, MD  traZODone (DESYREL) 50 MG tablet Take 0.5 tablets (25 mg total) by mouth at bedtime as needed for sleep. 08/18/13   Leone Haven, MD  trospium (SANCTURA) 20 MG tablet Take 20 mg by mouth 2 (two) times daily.  05/24/13   Historical Provider, MD   BP 187/48  Pulse 38  Temp(Src) 97.9 F (36.6 C) (Oral)  Resp 15  Ht 4\' 9"  (1.448 m)  SpO2 100% Physical Exam  Nursing note and vitals reviewed. Constitutional: She appears well-developed and well-nourished.  HENT:  Head: Normocephalic and atraumatic.  Eyes: EOM are normal. Pupils are equal, round, and reactive to light.  Neck: Normal range of motion. Neck supple.  Cardiovascular: Normal heart sounds and intact distal pulses.  Bradycardia present.   Pulmonary/Chest: Effort normal and breath sounds normal.  Abdominal: Bowel sounds are normal. She exhibits no distension. There is no tenderness.  Musculoskeletal: She exhibits edema and tenderness.  Erythema, warmth and tenderness L lateral ankle, varicose veins in L lower leg non-tender, no calf tenderness or proximal tenderness  Neurological: She is alert. She has normal strength. No cranial nerve deficit or sensory deficit.  Skin: Skin is warm and dry. No rash noted.  Psychiatric: She has a normal mood and affect.    ED Course  Procedures (including critical care time) Labs  Review Labs Reviewed  CBC WITH DIFFERENTIAL - Abnormal; Notable for the following:    WBC 11.1 (*)    RBC 3.25 (*)    Hemoglobin 10.4 (*)    HCT 31.8 (*)    Neutro Abs 8.3 (*)    Monocytes Absolute 1.2 (*)    All other components within normal limits  BASIC METABOLIC PANEL - Abnormal; Notable for the following:    Sodium 134 (*)    Glucose, Bld 105 (*)    BUN 33 (*)    Creatinine, Ser 1.31 (*)  GFR calc non Af Amer 34 (*)    GFR calc Af Amer 40 (*)    All other components within normal limits  CULTURE, BLOOD (ROUTINE X 2)  CULTURE, BLOOD (ROUTINE X 2)  TROPONIN I  PROTIME-INR  APTT  MAGNESIUM  CBC  COMPREHENSIVE METABOLIC PANEL  URINALYSIS, ROUTINE W REFLEX MICROSCOPIC  SEDIMENTATION RATE  URIC ACID    Imaging Review Dg Chest Port 1 View  08/29/2013   CLINICAL DATA:  Shortness of breath and fever, history of heart block and COPD  EXAM: PORTABLE CHEST - 1 VIEW  COMPARISON:  DG CHEST 1 VIEW dated 02/08/2013  FINDINGS: The lungs are adequately inflated. The interstitial markings are increased bilaterally. This is not entirely new but it is more conspicuous than on the previous study. The cardiopericardial silhouette is enlarged. The pulmonary vascularity is prominent centrally and mild cephalization is suspected. The trachea is midline. There is no pleural effusion or pneumothorax. There is a small amount of apical pleural thickening bilaterally. The observed portions of the bony thorax exhibit no acute abnormalities.  IMPRESSION: The findings suggest mild interstitial edema likely of cardiac cause. Interstitial pneumonia cannot be absolutely excluded.   Electronically Signed   By: David  Martinique   On: 08/29/2013 19:45    MUSE interpretation has not crossed over  Date: 08/29/2013  Rate: 39   Rhythm: sinus with 2nd degree 2:1 AV block  QRS Axis: normal  Intervals: AV block as above  ST/T Wave abnormalities: nonspecific T wave changes  Conduction Disutrbances:2nd degree block,  unclear Mobitz I or II  Narrative Interpretation:   Old EKG Reviewed: changes noted, AV block is new    MDM   Final diagnoses:  Second degree AV block  Ankle arthritis    Discussed with Cardiology, Dr. Debara Pickett who will evaluate the patient.     Charles B. Karle Starch, MD 08/29/13 1958

## 2013-08-29 NOTE — Telephone Encounter (Signed)
Duplicate telephone  See other entry.

## 2013-08-29 NOTE — H&P (Signed)
ADMISSION HISTORY & PHYSICAL   Chief Complaint:  Fatigue, near syncope, left ankle pain  Cardiologist: Dr. Lovena Le  Primary Care Physician: [name], MD  HPI:  This is a 80 y.o. female with a past medical history significant for hypertension, osteoarthritis, COPD, syncope and recent ?cellulitis of the arm.  She has been seen for numerous complaints  over the past month I both her primary care provider in specialist.  After her syncopal episode in October, she had a loop recorder placed. She has had some persistent bradycardia but was not felt to be significantly bradycardic to necessitate a pacemaker. She was scheduled to have surgery on her shoulder today however her surgery was canceled due to a low heart rate. She apparently was presyncopal and had graying of her vision. When she presented the emergency room her heart rate has been consistently in the 30s. EKG demonstrates high degree 2:1 AV block.  She's also been complaining of left ankle pain, which is swollen, hot and makes it difficult for her to bear weight. She does have a low-grade temperature of 99.5 Fahrenheit. Cardiology was asked to consult regarding her symptomatic bradycardia.   PMHx:  Past Medical History  Diagnosis Date  . Cervicalgia   . Peripheral neuropathy   . Hypertension   . Osteoporosis   . Insomnia   . Arthritis   . Headache   . Hypothyroidism   . Right rotator cuff tear   . OA (osteoarthritis)     RIGHT SHOULDER AC JOINT  . History of syncope     01/2013  DX  CYRPTOGENIC STROKE  . Moderate dementia   . Gait instability   . First degree heart block   . History of colon polyps   . Diverticulosis of colon   . SUI (stress urinary incontinence, female)   . Lumbar stenosis   . COPD (chronic obstructive pulmonary disease)   . Status post placement of implantable loop recorder     02-08-2013  . Cryptogenic stroke S/P  LOOP RECORDER IMPLANT    DX  01/2013  --  SYNCOPAL EPISODE --  EPISODIC   ATRIAL TACHY/ FIBULATION  AND PAUSES  . Frequency of urination   . Short of breath on exertion   . Chronic constipation   . Glaucoma of both eyes   . Hearing loss of both ears     no hearing aids  . Dry eyes     Past Surgical History  Procedure Laterality Date  . Loop recorder implant  02-08-2013   DR  Cristopher Peru  . Total abdominal hysterectomy w/ bilateral salpingoophorectomy  03-15-2005    FAMHx:  Family History  Problem Relation Age of Onset  . Kidney disease Mother     SOCHx:   reports that she has never smoked. She has never used smokeless tobacco. She reports that she does not drink alcohol or use illicit drugs.  ALLERGIES:  Allergies  Allergen Reactions  . Amlodipine Other (See Comments)    Peripheral edema with 2.56m daily dose  . Chlorthalidone Other (See Comments)    Hyponatremia  . Verapamil Other (See Comments)    Bradycardia - HR  35-45 on 462mTID  . Adhesive [Tape] Rash and Other (See Comments)    Skin irritation  . Honey Bee Treatment [Bee Venom] Rash    Rash     ROS: Review of systems not obtained due to patient factors. (language barrier)  HOME MEDS:  (Not in a hospital admission)  LABS/IMAGING: Results for orders placed during the hospital encounter of 08/29/13 (from the past 48 hour(s))  CBC WITH DIFFERENTIAL     Status: Abnormal   Collection Time    08/29/13  6:26 PM      Result Value Ref Range   WBC 11.1 (*) 4.0 - 10.5 K/uL   RBC 3.25 (*) 3.87 - 5.11 MIL/uL   Hemoglobin 10.4 (*) 12.0 - 15.0 g/dL   HCT 31.8 (*) 36.0 - 46.0 %   MCV 97.8  78.0 - 100.0 fL   MCH 32.0  26.0 - 34.0 pg   MCHC 32.7  30.0 - 36.0 g/dL   RDW 13.4  11.5 - 15.5 %   Platelets 181  150 - 400 K/uL   Neutrophils Relative % 75  43 - 77 %   Neutro Abs 8.3 (*) 1.7 - 7.7 K/uL   Lymphocytes Relative 14  12 - 46 %   Lymphs Abs 1.5  0.7 - 4.0 K/uL   Monocytes Relative 11  3 - 12 %   Monocytes Absolute 1.2 (*) 0.1 - 1.0 K/uL   Eosinophils Relative 0  0 - 5 %    Eosinophils Absolute 0.0  0.0 - 0.7 K/uL   Basophils Relative 0  0 - 1 %   Basophils Absolute 0.0  0.0 - 0.1 K/uL  BASIC METABOLIC PANEL     Status: Abnormal   Collection Time    08/29/13  6:26 PM      Result Value Ref Range   Sodium 134 (*) 137 - 147 mEq/L   Potassium 5.1  3.7 - 5.3 mEq/L   Chloride 98  96 - 112 mEq/L   CO2 25  19 - 32 mEq/L   Glucose, Bld 105 (*) 70 - 99 mg/dL   BUN 33 (*) 6 - 23 mg/dL   Creatinine, Ser 1.31 (*) 0.50 - 1.10 mg/dL   Calcium 8.8  8.4 - 10.5 mg/dL   GFR calc non Af Amer 34 (*) >90 mL/min   GFR calc Af Amer 40 (*) >90 mL/min   Comment: (NOTE)     The eGFR has been calculated using the CKD EPI equation.     This calculation has not been validated in all clinical situations.     eGFR's persistently <90 mL/min signify possible Chronic Kidney     Disease.  TROPONIN I     Status: None   Collection Time    08/29/13  6:26 PM      Result Value Ref Range   Troponin I <0.30  <0.30 ng/mL   Comment:            Due to the release kinetics of cTnI,     a negative result within the first hours     of the onset of symptoms does not rule out     myocardial infarction with certainty.     If myocardial infarction is still suspected,     repeat the test at appropriate intervals.  PROTIME-INR     Status: None   Collection Time    08/29/13  6:26 PM      Result Value Ref Range   Prothrombin Time 13.6  11.6 - 15.2 seconds   INR 1.06  0.00 - 1.49  APTT     Status: None   Collection Time    08/29/13  6:26 PM      Result Value Ref Range   aPTT 32  24 - 37 seconds   Dg Chest  Port 1 View  08/29/2013   CLINICAL DATA:  Shortness of breath and fever, history of heart block and COPD  EXAM: PORTABLE CHEST - 1 VIEW  COMPARISON:  DG CHEST 1 VIEW dated 02/08/2013  FINDINGS: The lungs are adequately inflated. The interstitial markings are increased bilaterally. This is not entirely new but it is more conspicuous than on the previous study. The cardiopericardial silhouette is  enlarged. The pulmonary vascularity is prominent centrally and mild cephalization is suspected. The trachea is midline. There is no pleural effusion or pneumothorax. There is a small amount of apical pleural thickening bilaterally. The observed portions of the bony thorax exhibit no acute abnormalities.  IMPRESSION: The findings suggest mild interstitial edema likely of cardiac cause. Interstitial pneumonia cannot be absolutely excluded.   Electronically Signed   By: David  Martinique   On: 08/29/2013 19:45    VITALS: Filed Vitals:   08/29/13 1915  BP: 162/42  Pulse: 36  Temp:   Resp: 17    EXAM: General appearance: alert and mild distress Neck: no carotid bruit and no JVD Lungs: rales bibasilar Heart: marked regular bradycardia, distant heart sounds Abdomen: soft, non-tender; bowel sounds normal; no masses,  no organomegaly Extremities: left ankle is swollen, warm and ruborous, the left calf may be slightly larger than the right Pulses: 2+ and symmetric Skin: rubor.erythema over the left ankle Neurologic: Mental status: Awake, follows commands Psych: Non-anxious  IMPRESSION: Principal Problem:   Second degree AV block, Mobitz type II Active Problems:   HTN (hypertension)   Alzheimer's dementia   Near syncope   Weakness   PLAN: 1. Ms. Jeanmarie has seemingly symptomatic high-degree AV block. This may be complicated by the fact that she was recently placed on clonidine for her persistently high blood pressure. I would discontinue this and use hydralazine as needed for blood pressure control. She will need CCU stepdown placement for close monitoring and Zoll pads available at the bedside. Please keep her NPO for possible pacemaker.  I'm also concerned about her left ankle swelling and redness. While this could be infectious, we should consider inflammatory arthropathy such as gout or other causes. Check blood cultures, agree with Keflex therapy - will try empiric colchicine for pain. Check  Uric Acid level and ESR.  CXR shows mild interstitial edema - would give a single dose 40 mg IV lasix now as well.  Should she become increasingly unstable overnight, options include transcutaneous pacing, transvenous pacing or temporary/permanent pacemaker - Dr. Jolyn Nap is on EP call and would be an appropriate contact.  Pixie Casino, MD, [facility] Attending Cardiologist [place] 08/29/2013, 8:04 PM

## 2013-08-30 ENCOUNTER — Encounter (HOSPITAL_COMMUNITY)
Admission: EM | Disposition: A | Discharge status: Skilled Nursing Facility | Payer: Self-pay | Source: Home / Self Care | Attending: Internal Medicine

## 2013-08-30 ENCOUNTER — Encounter (HOSPITAL_COMMUNITY): Payer: Self-pay | Admitting: *Deleted

## 2013-08-30 DIAGNOSIS — Z950 Presence of cardiac pacemaker: Secondary | ICD-10-CM | POA: Insufficient documentation

## 2013-08-30 DIAGNOSIS — I441 Atrioventricular block, second degree: Secondary | ICD-10-CM

## 2013-08-30 DIAGNOSIS — I5033 Acute on chronic diastolic (congestive) heart failure: Secondary | ICD-10-CM | POA: Diagnosis not present

## 2013-08-30 DIAGNOSIS — I517 Cardiomegaly: Secondary | ICD-10-CM | POA: Diagnosis not present

## 2013-08-30 DIAGNOSIS — M79609 Pain in unspecified limb: Secondary | ICD-10-CM

## 2013-08-30 DIAGNOSIS — J449 Chronic obstructive pulmonary disease, unspecified: Secondary | ICD-10-CM | POA: Diagnosis not present

## 2013-08-30 DIAGNOSIS — M7989 Other specified soft tissue disorders: Secondary | ICD-10-CM

## 2013-08-30 DIAGNOSIS — F0280 Dementia in other diseases classified elsewhere without behavioral disturbance: Secondary | ICD-10-CM | POA: Diagnosis not present

## 2013-08-30 HISTORY — PX: PACEMAKER INSERTION: SHX728

## 2013-08-30 HISTORY — PX: PERMANENT PACEMAKER INSERTION: SHX5480

## 2013-08-30 LAB — COMPREHENSIVE METABOLIC PANEL
ALT: 93 U/L — ABNORMAL HIGH (ref 0–35)
AST: 43 U/L — ABNORMAL HIGH (ref 0–37)
Albumin: 2.7 g/dL — ABNORMAL LOW (ref 3.5–5.2)
Alkaline Phosphatase: 101 U/L (ref 39–117)
BUN: 34 mg/dL — ABNORMAL HIGH (ref 6–23)
CO2: 27 mEq/L (ref 19–32)
Calcium: 8.7 mg/dL (ref 8.4–10.5)
Chloride: 98 mEq/L (ref 96–112)
Creatinine, Ser: 1.37 mg/dL — ABNORMAL HIGH (ref 0.50–1.10)
GFR calc Af Amer: 38 mL/min — ABNORMAL LOW (ref >90)
GFR calc non Af Amer: 32 mL/min — ABNORMAL LOW (ref >90)
Glucose, Bld: 89 mg/dL (ref 70–99)
Potassium: 4.5 mEq/L (ref 3.7–5.3)
Sodium: 136 mEq/L — ABNORMAL LOW (ref 137–147)
Total Bilirubin: 0.4 mg/dL (ref 0.3–1.2)
Total Protein: 6 g/dL (ref 6.0–8.3)

## 2013-08-30 LAB — CBC
HCT: 30.3 % — ABNORMAL LOW (ref 36.0–46.0)
Hemoglobin: 9.7 g/dL — ABNORMAL LOW (ref 12.0–15.0)
MCH: 31.3 pg (ref 26.0–34.0)
MCHC: 32 g/dL (ref 30.0–36.0)
MCV: 97.7 fL (ref 78.0–100.0)
Platelets: 170 10*3/uL (ref 150–400)
RBC: 3.1 MIL/uL — ABNORMAL LOW (ref 3.87–5.11)
RDW: 13.7 % (ref 11.5–15.5)
WBC: 8.5 10*3/uL (ref 4.0–10.5)

## 2013-08-30 LAB — SEDIMENTATION RATE: Sed Rate: 48 mm/hr — ABNORMAL HIGH (ref 0–22)

## 2013-08-30 LAB — PRO B NATRIURETIC PEPTIDE: PRO B NATRI PEPTIDE: 8447 pg/mL — AB (ref 0–450)

## 2013-08-30 LAB — TSH: TSH: 5.56 u[IU]/mL — AB (ref 0.350–4.500)

## 2013-08-30 LAB — URIC ACID: Uric Acid, Serum: 9.3 mg/dL — ABNORMAL HIGH (ref 2.4–7.0)

## 2013-08-30 SURGERY — PERMANENT PACEMAKER INSERTION
Anesthesia: LOCAL

## 2013-08-30 MED ORDER — CEFAZOLIN SODIUM 1-5 GM-% IV SOLN
1.0000 g | Freq: Four times a day (QID) | INTRAVENOUS | Status: AC
  Administered 2013-08-30 – 2013-08-31 (×3): 1 g via INTRAVENOUS
  Filled 2013-08-30 (×3): qty 50

## 2013-08-30 MED ORDER — SODIUM CHLORIDE 0.9 % IV SOLN: 250.0000 mL | INTRAVENOUS | Status: DC | PRN

## 2013-08-30 MED ORDER — HYDROCODONE-ACETAMINOPHEN 5-325 MG PO TABS
1.0000 | ORAL_TABLET | ORAL | Status: DC | PRN
  Administered 2013-08-30 (×2): 1 via ORAL
  Administered 2013-08-31: 2 via ORAL
  Filled 2013-08-30: qty 2
  Filled 2013-08-30 (×2): qty 1

## 2013-08-30 MED ORDER — LIDOCAINE HCL (PF) 1 % IJ SOLN
INTRAMUSCULAR | Status: AC
Start: 2013-08-30 — End: 2013-08-30
  Filled 2013-08-30: qty 60

## 2013-08-30 MED ORDER — CHLORHEXIDINE GLUCONATE 4 % EX LIQD
60.0000 mL | Freq: Once | CUTANEOUS | Status: AC
  Filled 2013-08-30: qty 60

## 2013-08-30 MED ORDER — HEPARIN (PORCINE) IN NACL 2-0.9 UNIT/ML-% IJ SOLN
INTRAMUSCULAR | Status: AC
  Filled 2013-08-30: qty 500

## 2013-08-30 MED ORDER — ACETAMINOPHEN 325 MG PO TABS: 325.0000 mg | ORAL_TABLET | ORAL | Status: DC | PRN

## 2013-08-30 MED ORDER — ONDANSETRON HCL 4 MG/2ML IJ SOLN
4.0000 mg | Freq: Four times a day (QID) | INTRAMUSCULAR | Status: DC | PRN
Start: 2013-08-30 — End: 2013-09-04
  Administered 2013-08-31 – 2013-09-03 (×2): 4 mg via INTRAVENOUS
  Filled 2013-08-30 (×2): qty 2

## 2013-08-30 MED ORDER — SODIUM CHLORIDE 0.9 % IR SOLN
80.0000 mg | Status: DC
  Filled 2013-08-30: qty 2

## 2013-08-30 MED ORDER — SODIUM CHLORIDE 0.9 % IJ SOLN
3.0000 mL | Freq: Two times a day (BID) | INTRAMUSCULAR | Status: DC
  Administered 2013-08-30 – 2013-09-04 (×7): 3 mL via INTRAVENOUS

## 2013-08-30 MED ORDER — CEFAZOLIN SODIUM-DEXTROSE 2-3 GM-% IV SOLR
2.0000 g | INTRAVENOUS | Status: DC
  Filled 2013-08-30: qty 50

## 2013-08-30 MED ORDER — WHITE PETROLATUM GEL
Status: AC
  Filled 2013-08-30: qty 5

## 2013-08-30 MED ORDER — SODIUM CHLORIDE 0.9 % IV SOLN: INTRAVENOUS | Status: DC

## 2013-08-30 MED ORDER — SODIUM CHLORIDE 0.9 % IJ SOLN: 3.0000 mL | INTRAMUSCULAR | Status: DC | PRN

## 2013-08-30 MED ORDER — CHLORHEXIDINE GLUCONATE 4 % EX LIQD
60.0000 mL | Freq: Once | CUTANEOUS | Status: AC
Start: 2013-08-30 — End: 2013-08-30
  Administered 2013-08-30: 4 via TOPICAL

## 2013-08-30 NOTE — Interval H&P Note (Signed)
History and Physical Interval Note:  08/30/2013 8:34 AM  Jordan [name]  has presented today for surgery, with the diagnosis of bradicardia  The various methods of treatment have been discussed with the patient and family. After consideration of risks, benefits and other options for treatment, the patient has consented to  Procedure(s): PERMANENT PACEMAKER INSERTION (N/A) as a surgical intervention .  The patient's history has been reviewed, patient examined, no change in status, stable for surgery.  I have reviewed the patient's chart and labs.  Questions were answered to the patient's satisfaction.     [name]

## 2013-08-30 NOTE — Progress Notes (Signed)
Orthopedic Tech Progress Note Patient Details:  Jordan [name] 11/19/1919 [id number]  Ortho Devices Type of Ortho Device: Sling immobilizer Ortho Device/Splint Location: lue Ortho Device/Splint Interventions: Application Replacement sling immobilizer  [name] 08/30/2013, 9:25 PM

## 2013-08-30 NOTE — Op Note (Signed)
SURGEON:  [name], MD     PREPROCEDURE DIAGNOSIS:  Symptomatic mobitz II second degree AV block and syncope    POSTPROCEDURE DIAGNOSIS:  Symptomatic mobitz II second degree AV block and syncope     PROCEDURES:   1.  Pacemaker implantation.   2. Implantable loop recorder removal    INTRODUCTION: Jordan [name] is a 80 y.o. female  with a history of symptomatic mobitz II second degree AV block and syncope who presents today for pacemaker implantation.  The patient previously had syncope for which a MDT Reveal LINQ was implanted by Dr Lovena Le.  This has shown mobitz II second degree AV block.  The patient reports progressive episodes of dizziness, fatigue and presyncope over the past few days associated with mobitz II second degree AV block and prolonged RR intervals.  No reversible causes have been identified.  The patient therefore presents today for pacemaker implantation.     DESCRIPTION OF PROCEDURE:  Informed written consent was obtained, and the patient was brought to the electrophysiology lab in a fasting state.  The patient required no sedation for the procedure today.  The patients left chest was prepped and draped in the usual sterile fashion by the EP lab staff. The skin overlying the left deltopectoral region was infiltrated with lidocaine for local analgesia.  A 4-cm incision was made over the left deltopectoral region.  A left subcutaneous pacemaker pocket was fashioned using a combination of sharp and blunt dissection. Electrocautery was required to assure hemostasis.    RA/RV Lead Placement: The left axillary vein was cannulated.  Through the left axillary vein, a Medtronic model [id number] (serial number [id number]) right atrial lead and a Medtronic model [id number]- [id number] (serial number [id number]) right ventricular lead were advanced with fluoroscopic visualization into the right atrial appendage and right ventricular apex positions respectively.  Initial atrial lead P- waves measured 1.5 mV  with impedance of 398 ohms and a threshold of 0.5 V at 0.5 msec.  Right ventricular lead R-waves measured 7.5 mV with an impedance of 455 ohms and a threshold of 0.5 V at 0.5 msec.  Both leads   were secured to the pectoralis fascia using #2-0 silk over the suture sleeves.   Device Placement: The leads were then connected to a Medtronic Adapta L model ADDRL 1 (serial number [id number]) pacemaker.  The pocket was irrigated with copious gentamicin solution.  The pacemaker was then placed into the pocket.  The pocket was then closed in 2 layers with 2.0 Vicryl suture for the subcutaneous and subcuticular layers.  Steri- Strips and a sterile dressing were then applied.  No contrast was required for the procedure today.  There were no early apparent complications.   Implantable loop recorder removal: The skin overlying the previously implanted medtronic LINQ [id number] ILR placed 02/11/13 was prepped and draped in the usual sterile fashion.  The skin overlying the device was infiltrated with lidocaine for local analgesia.  A 0.5-cm incision was made over the device.  The device was easily removed from the pocket.  The steristrips were placed over the incision for closure.     CONCLUSIONS:   1. Successful implantation of a Medtronic Adapta L dual-chamber pacemaker for mobitz II second degree AV Block  2. Successful removal of a previously implanted MDT LINQ implantable monitor  3. No early apparent complications.           [name], MD 08/30/2013 11:07 AM

## 2013-08-30 NOTE — Progress Notes (Signed)
Echocardiogram 2D Echocardiogram has been performed.  Jordan [name] Jordan [name] 08/30/2013, 1:55 PM

## 2013-08-30 NOTE — H&P (View-Only) (Signed)
ELECTROPHYSIOLOGY CONSULT NOTE    Patient ID: Jordan [name] MRN: [id number], DOB/AGE: 78-Oct-1921 78 y.o.  Admit date: 08/29/2013 Date of Consult: 08-30-2013  Primary Physician: [name], MD Primary Cardiologist: Lovena Le  Reason for Consultation: symptomatic bradycardia  HPI:  Jordan [name] is a 80 y.o. female with a past medical history of hypertension, hypothyroidism, osteoarthritis, and prior syncope s/p MDT LinQ implantable loop recorder.  She was scheduled for rotator cuff repair but this was cancelled due to bradycardia.  She developed weakness and fatigue and was brought to the ER for further evaluation.  She was found to be in mobitz II second degree heart block with ventricular rates in the 30's and has been intermittently transcutaneously pacing overnight.  She was previously on Clonidine for blood pressure control which has been held.   Last echo 01-2013 demonstrated normal EF with no RWMA. Lab work this admission is notable for elevated sed rate and uric acid level,  Hgb 9.7, creat 1.37, TSH 5.560.   EP has been asked to evaluate for treatment options.  She denies chest pain, shortness of breath, palpitations, or frank syncope.  ROS is otherwise negative except as outlined above.   Past Medical History  Diagnosis Date  . Cervicalgia   . Peripheral neuropathy   . Hypertension   . Osteoporosis   . Insomnia   . Arthritis   . Headache   . Hypothyroidism   . Right rotator cuff tear   . OA (osteoarthritis)     RIGHT SHOULDER AC JOINT  . History of syncope     01/2013  DX  CYRPTOGENIC STROKE  . Moderate dementia   . Gait instability   . First degree heart block   . History of colon polyps   . Diverticulosis of colon   . SUI (stress urinary incontinence, female)   . Lumbar stenosis   . Status post placement of implantable loop recorder     02-08-2013  . Cryptogenic stroke S/P  LOOP RECORDER IMPLANT    DX  01/2013  --  SYNCOPAL EPISODE --  EPISODIC  ATRIAL TACHY/  FIBULATION  AND PAUSES  . Frequency of urination   . Short of breath on exertion   . Chronic constipation   . Glaucoma of both eyes   . Hearing loss of both ears     no hearing aids  . Dry eyes   . Pneumonia      Surgical History:  Past Surgical History  Procedure Laterality Date  . Loop recorder implant  02-08-2013   DR  Cristopher Peru  . Total abdominal hysterectomy w/ bilateral salpingoophorectomy  03-15-2005  . Carpal tunnel release Right   . Eye surgery      cataract, bilat.     Prescriptions prior to admission  Medication Sig Dispense Refill  . acetaminophen (TYLENOL) 325 MG tablet Take 650 mg by mouth every 6 (six) hours as needed.      Marland Kitchen aspirin 81 MG tablet Take 81 mg by mouth daily.      . Calcium Carbonate-Vitamin D (CALTRATE 600+D) 600-400 MG-UNIT per tablet Take 1 tablet by mouth 2 (two) times daily.        . cloNIDine (CATAPRES) 0.1 MG tablet Take 1 tablet (0.1 mg total) by mouth 2 (two) times daily.  60 tablet  1  . diclofenac sodium (VOLTAREN) 1 % GEL Apply 2 g topically 4 (four) times daily.  10 Tube  1  . docusate sodium (COLACE) 100 MG  capsule Take 1 capsule (100 mg total) by mouth 2 (two) times daily.  60 capsule  0  . Glucosamine-Chondroitin-Vit D3 1500-1200-800 MG-MG-UNIT PACK Take 1 tablet by mouth 2 (two) times daily.        . hydrocortisone cream 1 % Apply 1 application topically 2 (two) times daily.  30 g  0  . levothyroxine (SYNTHROID, LEVOTHROID) 112 MCG tablet take 1 tablet by mouth once daily--  takes in am      . lidocaine (LIDODERM) 5 % APPLY 1 PATCH TO SKIN DAILY. MAY CUT PATCH AND USE 1/4 OR 1/2 OF PATCH INSTEAD  30 patch  0  . ondansetron (ZOFRAN) 4 MG tablet Take 1 tablet (4 mg total) by mouth every 8 (eight) hours as needed for nausea or vomiting.  20 tablet  0  . polyethylene glycol (MIRALAX) packet Take 17 g by mouth daily.  14 each  0  . RESTASIS 0.05 % ophthalmic emulsion Place 1 drop into both eyes daily.       . traMADol (ULTRAM) 50 MG  tablet Take 0.5 tablets (25 mg total) by mouth every 12 (twelve) hours as needed.  30 tablet  2  . TRAVATAN Z 0.004 % SOLN ophthalmic solution Place 1 drop into both eyes at bedtime.       . traZODone (DESYREL) 50 MG tablet Take 0.5 tablets (25 mg total) by mouth at bedtime as needed for sleep.  30 tablet  3  . trospium (SANCTURA) 20 MG tablet Take 20 mg by mouth 2 (two) times daily.         Inpatient Medications:  . aspirin EC  81 mg Oral Daily  . calcium-vitamin D  1 tablet Oral BID  . cephALEXin  250 mg Oral 3 times per day  . colchicine  0.6 mg Oral BID  . cycloSPORINE  1 drop Both Eyes Daily  . darifenacin  7.5 mg Oral Daily  . diclofenac sodium  2 g Topical QID  . docusate sodium  100 mg Oral BID  . heparin  5,000 Units Subcutaneous 3 times per day  . hydrALAZINE  25 mg Oral 3 times per day  . latanoprost  1 drop Both Eyes QHS  . levothyroxine  112 mcg Oral QAC breakfast  . polyethylene glycol  17 g Oral Daily  . white petrolatum        Allergies:  Allergies  Allergen Reactions  . Amlodipine Other (See Comments)    Peripheral edema with 2.5mg  daily dose  . Chlorthalidone Other (See Comments)    Hyponatremia  . Verapamil Other (See Comments)    Bradycardia - HR  35-45 on 40mg  TID  . Adhesive [Tape] Rash and Other (See Comments)    Skin irritation  . Honey Bee Treatment [Bee Venom] Rash    Rash     History   Social History  . Marital Status: Widowed    Spouse Name: N/A    Number of Children: N/A  . Years of Education: N/A   Occupational History  . Not on file.   Social History Main Topics  . Smoking status: Never Smoker   . Smokeless tobacco: Never Used  . Alcohol Use: No  . Drug Use: No  . Sexual Activity: No   Other Topics Concern  . Not on file   Social History Narrative   lives with son, [place]; No tob, etoh.    Has three total children. 1 g'child.;    Dtr [name] (w) [phone], (  h) [id number]; dtr [place]. Daughters usually interpret for her at  visits and are very involved and ask lots of questions.      Family History  Problem Relation Age of Onset  . Kidney disease Mother     Physical Exam: Filed Vitals:   08/30/13 0300 08/30/13 0402 08/30/13 0600 08/30/13 0700  BP: 140/29 159/30 170/27 138/20  Pulse: 32 32 36 36  Temp:  98.1 F (36.7 C)  98.2 F (36.8 C)  TempSrc:  Oral  Oral  Resp: 17 14 15 16   Height:      Weight:      SpO2: 97% 100% 100% 99%    GEN- The patient is elderly appearing, alert and oriented x 3 today.   Head- normocephalic, atraumatic Eyes-  Sclera clear, conjunctiva pink Ears- hearing intact Oropharynx- clear Neck- supple,   Lungs- Clear to ausculation bilaterally, normal work of breathing Heart- bradycardic regular rhythm, no murmurs GI- soft, NT, ND, + BS Extremities- no clubbing, cyanosis, or edema MS- diffuse muscle atrophy Skin- L lateral foot is warm and erythmatous Psych- euthymic mood, full affect Neuro- strength and sensation are intact   Labs:   Lab Results  Component Value Date   WBC 8.5 08/30/2013   HGB 9.7* 08/30/2013   HCT 30.3* 08/30/2013   MCV 97.7 08/30/2013   PLT 170 08/30/2013    Recent Labs Lab 08/30/13 0504  NA 136*  K 4.5  CL 98  CO2 27  BUN 34*  CREATININE 1.37*  CALCIUM 8.7  PROT 6.0  BILITOT 0.4  ALKPHOS 101  ALT 93*  AST 43*  GLUCOSE 89    Radiology/Studies: Dg Elbow Complete Right 08-11-13   CLINICAL DATA:  Right elbow pain and discomfort for 1 day  EXAM: RIGHT ELBOW - COMPLETE 3+ VIEW  COMPARISON:  None  FINDINGS: No displaced fracture or elbow fusion. There is minimal enthesopathic change about the medial and lateral epicondyles. Joint spaces are grossly preserved. There is mild soft tissue swelling about the posterior aspect of the elbow without associated radiopaque foreign body.  IMPRESSION: Mild soft tissue swelling about the posterior aspect of the elbow without associated displaced fracture or elbow joint effusion.   Electronically Signed   By: Sandi Mariscal M.D.   On: 08/11/2013 17:02   Dg Chest Port 1 View 08/29/2013   CLINICAL DATA:  Shortness of breath and fever, history of heart block and COPD  EXAM: PORTABLE CHEST - 1 VIEW  COMPARISON:  DG CHEST 1 VIEW dated 02/08/2013  FINDINGS: The lungs are adequately inflated. The interstitial markings are increased bilaterally. This is not entirely new but it is more conspicuous than on the previous study. The cardiopericardial silhouette is enlarged. The pulmonary vascularity is prominent centrally and mild cephalization is suspected. The trachea is midline. There is no pleural effusion or pneumothorax. There is a small amount of apical pleural thickening bilaterally. The observed portions of the bony thorax exhibit no acute abnormalities.  IMPRESSION: The findings suggest mild interstitial edema likely of cardiac cause. Interstitial pneumonia cannot be absolutely excluded.   Electronically Signed   By: David  Martinique   On: 08/29/2013 19:45    EKG:2:1 heart block, ventricular rate 37, QRS 88  TELEMETRY: high grade heart block with ventricular rates 30-40, intermittent transcutaneous pacing  Assessment and Plan:  1. Mobitz II second degree AV block The patient has symptomatic advanced AV block.  She is requiring intermittent transcutaneous pacing at this time.  No reversible causes are  found.  I would therefore recommend pacemaker implantation at this time.  Risks, benefits, alternatives to pacemaker implantation were discussed in detail with the patient and her daughter today. The patient understands that the risks include but are not limited to bleeding, infection, pneumothorax, perforation, tamponade, vascular damage, renal failure, MI, stroke, death,  and lead dislodgement and wishes to proceed. We will therefore schedule the procedure at this time.  I will plan to remove her implantable loop recorder at the time of the procedure.  2. L foot warmth/ swelling Given elevated uric acid, this could be  gout.  I will have ortho evaluate once she is more stable

## 2013-08-30 NOTE — Progress Notes (Signed)
Utilization review completed.  

## 2013-08-30 NOTE — Consult Note (Signed)
ELECTROPHYSIOLOGY CONSULT NOTE    Patient ID: Jordan [name] MRN: [id number], DOB/AGE: [birth year] 78 y.o.  Admit date: 08/29/2013 Date of Consult: 08-30-2013  Primary Physician: [name], MD Primary Cardiologist: Lovena Le  Reason for Consultation: symptomatic bradycardia  HPI:  Jordan [name] is a 80 y.o. female with a past medical history of hypertension, hypothyroidism, osteoarthritis, and prior syncope s/p MDT LinQ implantable loop recorder.  She was scheduled for rotator cuff repair but this was cancelled due to bradycardia.  She developed weakness and fatigue and was brought to the ER for further evaluation.  She was found to be in mobitz II second degree heart block with ventricular rates in the 30's and has been intermittently transcutaneously pacing overnight.  She was previously on Clonidine for blood pressure control which has been held.   Last echo 01-2013 demonstrated normal EF with no RWMA. Lab work this admission is notable for elevated sed rate and uric acid level,  Hgb 9.7, creat 1.37, TSH 5.560.   EP has been asked to evaluate for treatment options.  She denies chest pain, shortness of breath, palpitations, or frank syncope.  ROS is otherwise negative except as outlined above.   Past Medical History  Diagnosis Date  . Cervicalgia   . Peripheral neuropathy   . Hypertension   . Osteoporosis   . Insomnia   . Arthritis   . Headache   . Hypothyroidism   . Right rotator cuff tear   . OA (osteoarthritis)     RIGHT SHOULDER AC JOINT  . History of syncope     01/2013  DX  CYRPTOGENIC STROKE  . Moderate dementia   . Gait instability   . First degree heart block   . History of colon polyps   . Diverticulosis of colon   . SUI (stress urinary incontinence, female)   . Lumbar stenosis   . Status post placement of implantable loop recorder     02-08-2013  . Cryptogenic stroke S/P  LOOP RECORDER IMPLANT    DX  01/2013  --  SYNCOPAL EPISODE --  EPISODIC  ATRIAL TACHY/  FIBULATION  AND PAUSES  . Frequency of urination   . Short of breath on exertion   . Chronic constipation   . Glaucoma of both eyes   . Hearing loss of both ears     no hearing aids  . Dry eyes   . Pneumonia      Surgical History:  Past Surgical History  Procedure Laterality Date  . Loop recorder implant  02-08-2013   DR  Cristopher Peru  . Total abdominal hysterectomy w/ bilateral salpingoophorectomy  03-15-2005  . Carpal tunnel release Right   . Eye surgery      cataract, bilat.     Prescriptions prior to admission  Medication Sig Dispense Refill  . acetaminophen (TYLENOL) 325 MG tablet Take 650 mg by mouth every 6 (six) hours as needed.      Marland Kitchen aspirin 81 MG tablet Take 81 mg by mouth daily.      . Calcium Carbonate-Vitamin D (CALTRATE 600+D) 600-400 MG-UNIT per tablet Take 1 tablet by mouth 2 (two) times daily.        . cloNIDine (CATAPRES) 0.1 MG tablet Take 1 tablet (0.1 mg total) by mouth 2 (two) times daily.  60 tablet  1  . diclofenac sodium (VOLTAREN) 1 % GEL Apply 2 g topically 4 (four) times daily.  10 Tube  1  . docusate sodium (COLACE) 100 MG  capsule Take 1 capsule (100 mg total) by mouth 2 (two) times daily.  60 capsule  0  . Glucosamine-Chondroitin-Vit D3 1500-1200-800 MG-MG-UNIT PACK Take 1 tablet by mouth 2 (two) times daily.        . hydrocortisone cream 1 % Apply 1 application topically 2 (two) times daily.  30 g  0  . levothyroxine (SYNTHROID, LEVOTHROID) 112 MCG tablet take 1 tablet by mouth once daily--  takes in am      . lidocaine (LIDODERM) 5 % APPLY 1 PATCH TO SKIN DAILY. MAY CUT PATCH AND USE 1/4 OR 1/2 OF PATCH INSTEAD  30 patch  0  . ondansetron (ZOFRAN) 4 MG tablet Take 1 tablet (4 mg total) by mouth every 8 (eight) hours as needed for nausea or vomiting.  20 tablet  0  . polyethylene glycol (MIRALAX) packet Take 17 g by mouth daily.  14 each  0  . RESTASIS 0.05 % ophthalmic emulsion Place 1 drop into both eyes daily.       . traMADol (ULTRAM) 50 MG  tablet Take 0.5 tablets (25 mg total) by mouth every 12 (twelve) hours as needed.  30 tablet  2  . TRAVATAN Z 0.004 % SOLN ophthalmic solution Place 1 drop into both eyes at bedtime.       . traZODone (DESYREL) 50 MG tablet Take 0.5 tablets (25 mg total) by mouth at bedtime as needed for sleep.  30 tablet  3  . trospium (SANCTURA) 20 MG tablet Take 20 mg by mouth 2 (two) times daily.         Inpatient Medications:  . aspirin EC  81 mg Oral Daily  . calcium-vitamin D  1 tablet Oral BID  . cephALEXin  250 mg Oral 3 times per day  . colchicine  0.6 mg Oral BID  . cycloSPORINE  1 drop Both Eyes Daily  . darifenacin  7.5 mg Oral Daily  . diclofenac sodium  2 g Topical QID  . docusate sodium  100 mg Oral BID  . heparin  5,000 Units Subcutaneous 3 times per day  . hydrALAZINE  25 mg Oral 3 times per day  . latanoprost  1 drop Both Eyes QHS  . levothyroxine  112 mcg Oral QAC breakfast  . polyethylene glycol  17 g Oral Daily  . white petrolatum        Allergies:  Allergies  Allergen Reactions  . Amlodipine Other (See Comments)    Peripheral edema with 2.5mg  daily dose  . Chlorthalidone Other (See Comments)    Hyponatremia  . Verapamil Other (See Comments)    Bradycardia - HR  35-45 on 40mg  TID  . Adhesive [Tape] Rash and Other (See Comments)    Skin irritation  . Honey Bee Treatment [Bee Venom] Rash    Rash     History   Social History  . Marital Status: Widowed    Spouse Name: N/A    Number of Children: N/A  . Years of Education: N/A   Occupational History  . Not on file.   Social History Main Topics  . Smoking status: Never Smoker   . Smokeless tobacco: Never Used  . Alcohol Use: No  . Drug Use: No  . Sexual Activity: No   Other Topics Concern  . Not on file   Social History Narrative   lives with son, [place]; No tob, etoh.    Has three total children. 1 g'child.;    Dtr [name] (w) [phone], (  h) [id number]; dtr [place]. Daughters usually interpret for her at  visits and are very involved and ask lots of questions.      Family History  Problem Relation Age of Onset  . Kidney disease Mother     Physical Exam: Filed Vitals:   08/30/13 0300 08/30/13 0402 08/30/13 0600 08/30/13 0700  BP: 140/29 159/30 170/27 138/20  Pulse: 32 32 36 36  Temp:  98.1 F (36.7 C)  98.2 F (36.8 C)  TempSrc:  Oral  Oral  Resp: 17 14 15 16   Height:      Weight:      SpO2: 97% 100% 100% 99%    GEN- The patient is elderly appearing, alert and oriented x 3 today.   Head- normocephalic, atraumatic Eyes-  Sclera clear, conjunctiva pink Ears- hearing intact Oropharynx- clear Neck- supple,   Lungs- Clear to ausculation bilaterally, normal work of breathing Heart- bradycardic regular rhythm, no murmurs GI- soft, NT, ND, + BS Extremities- no clubbing, cyanosis, or edema MS- diffuse muscle atrophy Skin- L lateral foot is warm and erythmatous Psych- euthymic mood, full affect Neuro- strength and sensation are intact   Labs:   Lab Results  Component Value Date   WBC 8.5 08/30/2013   HGB 9.7* 08/30/2013   HCT 30.3* 08/30/2013   MCV 97.7 08/30/2013   PLT 170 08/30/2013    Recent Labs Lab 08/30/13 0504  NA 136*  K 4.5  CL 98  CO2 27  BUN 34*  CREATININE 1.37*  CALCIUM 8.7  PROT 6.0  BILITOT 0.4  ALKPHOS 101  ALT 93*  AST 43*  GLUCOSE 89    Radiology/Studies: Dg Elbow Complete Right Aug 29, 2013   CLINICAL DATA:  Right elbow pain and discomfort for 1 day  EXAM: RIGHT ELBOW - COMPLETE 3+ VIEW  COMPARISON:  None  FINDINGS: No displaced fracture or elbow fusion. There is minimal enthesopathic change about the medial and lateral epicondyles. Joint spaces are grossly preserved. There is mild soft tissue swelling about the posterior aspect of the elbow without associated radiopaque foreign body.  IMPRESSION: Mild soft tissue swelling about the posterior aspect of the elbow without associated displaced fracture or elbow joint effusion.   Electronically Signed   By: Sandi Mariscal M.D.   On: 08-29-2013 17:02   Dg Chest Port 1 View 08/29/2013   CLINICAL DATA:  Shortness of breath and fever, history of heart block and COPD  EXAM: PORTABLE CHEST - 1 VIEW  COMPARISON:  DG CHEST 1 VIEW dated 02/08/2013  FINDINGS: The lungs are adequately inflated. The interstitial markings are increased bilaterally. This is not entirely new but it is more conspicuous than on the previous study. The cardiopericardial silhouette is enlarged. The pulmonary vascularity is prominent centrally and mild cephalization is suspected. The trachea is midline. There is no pleural effusion or pneumothorax. There is a small amount of apical pleural thickening bilaterally. The observed portions of the bony thorax exhibit no acute abnormalities.  IMPRESSION: The findings suggest mild interstitial edema likely of cardiac cause. Interstitial pneumonia cannot be absolutely excluded.   Electronically Signed   By: David  Martinique   On: 08/29/2013 19:45    EKG:2:1 heart block, ventricular rate 37, QRS 88  TELEMETRY: high grade heart block with ventricular rates 30-40, intermittent transcutaneous pacing  Assessment and Plan:  1. Mobitz II second degree AV block The patient has symptomatic advanced AV block.  She is requiring intermittent transcutaneous pacing at this time.  No reversible causes are  found.  I would therefore recommend pacemaker implantation at this time.  Risks, benefits, alternatives to pacemaker implantation were discussed in detail with the patient and her daughter today. The patient understands that the risks include but are not limited to bleeding, infection, pneumothorax, perforation, tamponade, vascular damage, renal failure, MI, stroke, death,  and lead dislodgement and wishes to proceed. We will therefore schedule the procedure at this time.  I will plan to remove her implantable loop recorder at the time of the procedure.  2. L foot warmth/ swelling Given elevated uric acid, this could be  gout.  I will have ortho evaluate once she is more stable

## 2013-08-30 NOTE — Telephone Encounter (Signed)
Wefndt to look patient up to discuss with Dr Lovena Le and she had been taken to the ER for evaluation which is what I advised her to do last night.  [employer] PPM implanted today

## 2013-08-31 ENCOUNTER — Inpatient Hospital Stay (HOSPITAL_COMMUNITY): Payer: Medicare Other

## 2013-08-31 DIAGNOSIS — I441 Atrioventricular block, second degree: Secondary | ICD-10-CM | POA: Diagnosis not present

## 2013-08-31 DIAGNOSIS — F0280 Dementia in other diseases classified elsewhere without behavioral disturbance: Secondary | ICD-10-CM | POA: Diagnosis not present

## 2013-08-31 DIAGNOSIS — S8990XA Unspecified injury of unspecified lower leg, initial encounter: Secondary | ICD-10-CM | POA: Diagnosis not present

## 2013-08-31 DIAGNOSIS — M25579 Pain in unspecified ankle and joints of unspecified foot: Secondary | ICD-10-CM | POA: Diagnosis not present

## 2013-08-31 DIAGNOSIS — I5033 Acute on chronic diastolic (congestive) heart failure: Secondary | ICD-10-CM | POA: Diagnosis not present

## 2013-08-31 DIAGNOSIS — I509 Heart failure, unspecified: Secondary | ICD-10-CM | POA: Diagnosis not present

## 2013-08-31 DIAGNOSIS — J449 Chronic obstructive pulmonary disease, unspecified: Secondary | ICD-10-CM | POA: Diagnosis not present

## 2013-08-31 MED ORDER — OXYCODONE-ACETAMINOPHEN 5-325 MG PO TABS
1.0000 | ORAL_TABLET | Freq: Four times a day (QID) | ORAL | Status: DC | PRN
  Administered 2013-08-31 – 2013-09-03 (×3): 1 via ORAL
  Filled 2013-08-31 (×3): qty 1

## 2013-08-31 MED ORDER — FUROSEMIDE 10 MG/ML IJ SOLN
40.0000 mg | Freq: Once | INTRAMUSCULAR | Status: AC
  Administered 2013-08-31: 40 mg via INTRAVENOUS
  Filled 2013-08-31: qty 4

## 2013-08-31 MED ORDER — LEVOTHYROXINE SODIUM 125 MCG PO TABS
125.0000 ug | ORAL_TABLET | Freq: Every day | ORAL | Status: DC
  Administered 2013-09-01 – 2013-09-04 (×4): 125 ug via ORAL
  Filled 2013-08-31 (×6): qty 1

## 2013-08-31 MED ORDER — ONDANSETRON HCL 4 MG/2ML IJ SOLN
4.0000 mg | Freq: Once | INTRAMUSCULAR | Status: AC
  Administered 2013-08-31: 4 mg via INTRAVENOUS
  Filled 2013-08-31: qty 2

## 2013-08-31 NOTE — Progress Notes (Signed)
SUBJECTIVE: The patient is doing well today.  + nausea and headache.  L ankle remains swollen  . colchicine  0.6 mg Oral BID  . cycloSPORINE  1 drop Both Eyes Daily  . darifenacin  7.5 mg Oral Daily  . diclofenac sodium  2 g Topical QID  . docusate sodium  100 mg Oral BID  . furosemide  40 mg Intravenous Once  . hydrALAZINE  25 mg Oral 3 times per day  . latanoprost  1 drop Both Eyes QHS  . [START ON 09/01/2013] levothyroxine  125 mcg Oral QAC breakfast  . polyethylene glycol  17 g Oral Daily  . sodium chloride  3 mL Intravenous Q12H      OBJECTIVE: Physical Exam: Filed Vitals:   08/31/13 0000 08/31/13 0200 08/31/13 0400 08/31/13 0600  BP: 143/55 131/38 149/55 145/38  Pulse:    89  Temp:    98 F (36.7 C)  TempSrc:    Oral  Resp: 20 20 20 20   Height:      Weight: 144 lb 9.6 oz (65.59 kg)     SpO2: 97% 98% 97% 97%    Intake/Output Summary (Last 24 hours) at 08/31/13 1039 Last data filed at 08/31/13 1012  Gross per 24 hour  Intake      3 ml  Output    250 ml  Net   -247 ml    Telemetry reveals sinus rhythm, V paced  GEN- The patient is well appearing, alert  Head- normocephalic, atraumatic Eyes-  Sclera clear, conjunctiva pink Ears- hearing intact Oropharynx- clear Neck- supple  Lungs- bibasilar rales, normal work of breathing Heart- Regular rate and rhythm (paced) GI- soft, NT, ND, + BS Extremities- no clubbing, cyanosis, + L ankle is red and swollen Skin- pacemaker site with some bleeding but no significant hematoma, redressed by me today Psych- euthymic mood, full affect Neuro- strength and sensation are intact  LABS: Basic Metabolic Panel:  Recent Labs  08/29/13 1826 08/29/13 1936 08/30/13 0504  NA 134*  --  136*  K 5.1  --  4.5  CL 98  --  98  CO2 25  --  27  GLUCOSE 105*  --  89  BUN 33*  --  34*  CREATININE 1.31*  --  1.37*  CALCIUM 8.8  --  8.7  MG  --  2.3  --    Liver Function Tests:  Recent Labs  08/30/13 0504  AST 43*  ALT  93*  ALKPHOS 101  BILITOT 0.4  PROT 6.0  ALBUMIN 2.7*   No results found for this basename: LIPASE, AMYLASE,  in the last 72 hours CBC:  Recent Labs  08/29/13 1826 08/30/13 0504  WBC 11.1* 8.5  NEUTROABS 8.3*  --   HGB 10.4* 9.7*  HCT 31.8* 30.3*  MCV 97.8 97.7  PLT 181 170   Cardiac Enzymes:  Recent Labs  08/29/13 1826  TROPONINI <0.30   BNP: No components found with this basename: POCBNP,  D-Dimer: No results found for this basename: DDIMER,  in the last 72 hours Hemoglobin A1C: No results found for this basename: HGBA1C,  in the last 72 hours Fasting Lipid Panel: No results found for this basename: CHOL, HDL, LDLCALC, TRIG, CHOLHDL, LDLDIRECT,  in the last 72 hours Thyroid Function Tests:  Recent Labs  08/30/13 0504  TSH 5.560*   Anemia Panel: No results found for this basename: VITAMINB12, FOLATE, FERRITIN, TIBC, IRON, RETICCTPCT,  in the last 72 hours  RADIOLOGY:  Dg Chest 2 View  08/31/2013   CLINICAL DATA:  Status post device implantation.  EXAM: CHEST  2 VIEW  COMPARISON:  08/29/2013  FINDINGS: There is a left chest wall pacer device with lead in the right atrial appendage and right ventricle. There is mild cardiac enlargement. No pneumothorax identified after pacer placement. Bilateral pleural effusions and moderate to marked interstitial edema has increased from previous exam.  IMPRESSION: 1. No complication status post pacer placement. 2. Progression of CHF.   Electronically Signed   By: Kerby Moors M.D.   On: 08/31/2013 08:34   Dg Elbow Complete Right  08/07/2013   CLINICAL DATA:  Right elbow pain and discomfort for 1 day  EXAM: RIGHT ELBOW - COMPLETE 3+ VIEW  COMPARISON:  None  FINDINGS: No displaced fracture or elbow fusion. There is minimal enthesopathic change about the medial and lateral epicondyles. Joint spaces are grossly preserved. There is mild soft tissue swelling about the posterior aspect of the elbow without associated radiopaque foreign  body.  IMPRESSION: Mild soft tissue swelling about the posterior aspect of the elbow without associated displaced fracture or elbow joint effusion.   Electronically Signed   By: Sandi Mariscal M.D.   On: 08/07/2013 17:02   Dg Chest Port 1 View  08/29/2013   CLINICAL DATA:  Shortness of breath and fever, history of heart block and COPD  EXAM: PORTABLE CHEST - 1 VIEW  COMPARISON:  DG CHEST 1 VIEW dated 02/08/2013  FINDINGS: The lungs are adequately inflated. The interstitial markings are increased bilaterally. This is not entirely new but it is more conspicuous than on the previous study. The cardiopericardial silhouette is enlarged. The pulmonary vascularity is prominent centrally and mild cephalization is suspected. The trachea is midline. There is no pleural effusion or pneumothorax. There is a small amount of apical pleural thickening bilaterally. The observed portions of the bony thorax exhibit no acute abnormalities.  IMPRESSION: The findings suggest mild interstitial edema likely of cardiac cause. Interstitial pneumonia cannot be absolutely excluded.   Electronically Signed   By: David  Martinique   On: 08/29/2013 19:45    ASSESSMENT AND PLAN:  Principal Problem:   Second degree AV block, Mobitz type II Active Problems:   HTN (hypertension)   Alzheimer's dementia   Near syncope   Weakness   Mobitz type 2 second degree heart block 1. Complete heart block Normal pacemaker function No changes today Routine wound care and follow-up Device interrogation and cxr are reviewd today  2. Acute diastolic chf/ chronic renal failure Volume overloaded on exam and by cxr Will give IV lasix x 1 today  3. Headache/ nausea zofran Family reports that percocet has done better than vycodin.  I have therefore made this substituion  4. Hypothyroid Increase synthroid today to 125 mcg daily Need to follow-up with pcp  5. L ankle swelling Likely due to gout Will continue colchicine Ortho to see  6.  Unsteadiness PT to assess  Will need SNF vs [facility] cardiology to see tomorrow Possibly home with HHPT tomorrow vs SNF on Monday   [name], MD 08/31/2013 10:39 AM

## 2013-08-31 NOTE — Progress Notes (Signed)
Spoke w/ ortho on call, they will see later today or tomorrow. Put her on their list. Ordered Xray as requested.

## 2013-08-31 NOTE — Evaluation (Signed)
Physical Therapy Evaluation Patient Details Name: Jordan [name] MRN: [id number] DOB: 06-Jan-1920 Today's Date: 08/31/2013   History of Present Illness  Pt s/p pacemaker insertion  Clinical Impression  Pt required min/modA for mobility and ADLs PTA. Pt with good home set up and support. Pt unable to safely use RW at this time. Recommend pt use w/c until pt able to tolerate ambulation with placing minimal WBing thru L UE. Family agreeable. HHPT to progress ambulation with RW as they see appropriate. Family aware of L UE precautions. Pt con't to requires 24/7 assist for safe d/c home with family.    Follow Up Recommendations Home health PT;Supervision - Intermittent    Equipment Recommendations  None recommended by PT    Recommendations for Other Services       Precautions / Restrictions Precautions Precautions: ICD/Pacemaker Precaution Comments: educated on no overhead ROM Required Braces or Orthoses: Sling (L UE) Restrictions Weight Bearing Restrictions: No      Mobility  Bed Mobility Overal bed mobility: Needs Assistance Bed Mobility: Supine to Sit     Supine to sit: Max assist;HOB elevated     General bed mobility comments: HOB all the way up, pt able to move LEs to EOB. maxA for trunk elevation and to bring hips to EOB  Transfers Overall transfer level: Needs assistance Equipment used: 1 person hand held assist Transfers: Stand Pivot Transfers   Stand pivot transfers: Mod assist       General transfer comment: pt unable to use L UE functionally. pt with posterior bias. pt able to take steps to chair with max directional verbal and tactile cues.  Ambulation/Gait                Stairs            Wheelchair Mobility    Modified Rankin (Stroke Patients Only)       Balance Overall balance assessment: Needs assistance Sitting-balance support: Feet supported Sitting balance-Leahy Scale: Fair     Standing balance support: Single extremity  supported Standing balance-Leahy Scale: Poor Standing balance comment: pt requires assist to maintain balance                             Pertinent Vitals/Pain 155/52 BP in lying 144/50 in chair    Home Living Family/patient expects to be discharged to:: Private residence Living Arrangements: Children Available Help at Discharge: Family;Personal care attendant;Available PRN/intermittently Type of Home: Apartment Home Access: Level entry     Home Layout: One level Home Equipment: Walker - 4 wheels;Bedside commode;Wheelchair - manual      Prior Function Level of Independence: Needs assistance   Gait / Transfers Assistance Needed: uses RW for short distance ambulation in home  ADL's / Homemaking Assistance Needed: daugther assist pt with dressing/bathing, son does all cooking/cleaning        Hand Dominance        Extremity/Trunk Assessment   Upper Extremity Assessment: LUE deficits/detail;RUE deficits/detail RUE Deficits / Details: limited shld ROM due to rotator cuff injury, elbow/wrist/hand WFL     LUE Deficits / Details: shoulder limited by pacemaker precautions. elbow/wrist/hand WFL   Lower Extremity Assessment: Generalized weakness      Cervical / Trunk Assessment: Kyphotic  Communication   Communication: Prefers language other than [place];Other (comment)  Cognition Arousal/Alertness: Awake/alert Behavior During Therapy: WFL for tasks assessed/performed Overall Cognitive Status: Within Functional Limits for tasks assessed  General Comments General comments (skin integrity, edema, etc.): pt with nausea upon PT arrival however only dry heaves, no vomiting. Pt with request to don mesh panties. Pt dependent, unable to assist.    Exercises General Exercises - Lower Extremity Ankle Circles/Pumps: AROM;Both;20 reps;Seated Long Arc Quad: AROM;Both;10 reps;Seated Hip Flexion/Marching: AROM;Both;10 reps;Seated       Assessment/Plan    PT Assessment Patient needs continued PT services  PT Diagnosis Difficulty walking;Acute pain   PT Problem List Decreased strength;Decreased range of motion;Decreased activity tolerance;Decreased balance;Decreased mobility  PT Treatment Interventions DME instruction;Gait training;Functional mobility training;Therapeutic activities;Therapeutic exercise;Balance training   PT Goals (Current goals can be found in the Care Plan section) Acute Rehab PT Goals PT Goal Formulation: With patient Time For Goal Achievement: 09/14/13 Potential to Achieve Goals: Good    Frequency Min 3X/week   Barriers to discharge        Co-evaluation               End of Session Equipment Utilized During Treatment: Gait belt Activity Tolerance: Patient limited by pain;Patient limited by fatigue Patient left: in chair;with call bell/phone within reach;with family/visitor present Nurse Communication: Mobility status         Time: [id number] PT Time Calculation (min): 45 min   Charges:   PT Evaluation $Initial PT Evaluation Tier I: 1 Procedure PT Treatments $Therapeutic Exercise: 8-22 mins $Therapeutic Activity: 8-22 mins   PT G Codes:          Cylan Borum M Gilda Abboud 08/31/2013, 9:59 AM  Kittie Plater, PT, DPT Pager #: [phone] Office #: [phone]

## 2013-08-31 NOTE — Consult Note (Signed)
Reason for Consult:  Left ankle swelling and pain Referring Physician: Cardiology  Jordan [name] is an 80 y.o. female.  HPI:   80 yo female in the hospital as an inpatient for cardiac reasons.  I was asked to see her to evaluate left ankle pain and swelling.  She denies any injury and points to the lateral aspect of her ankle as the source of her pain.  She says that her pain is only mild to moderate.  Past Medical History  Diagnosis Date  . Cervicalgia   . Peripheral neuropathy   . Hypertension   . Osteoporosis   . Insomnia   . Arthritis   . Headache   . Hypothyroidism   . Right rotator cuff tear   . OA (osteoarthritis)     RIGHT SHOULDER AC JOINT  . History of syncope     01/2013  DX  CYRPTOGENIC STROKE  . Moderate dementia   . Gait instability   . First degree heart block   . History of colon polyps   . Diverticulosis of colon   . SUI (stress urinary incontinence, female)   . Lumbar stenosis   . Status post placement of implantable loop recorder     02-08-2013  . Cryptogenic stroke S/P  LOOP RECORDER IMPLANT    DX  01/2013  --  SYNCOPAL EPISODE --  EPISODIC  ATRIAL TACHY/ FIBULATION  AND PAUSES  . Frequency of urination   . Short of breath on exertion   . Chronic constipation   . Glaucoma of both eyes   . Hearing loss of both ears     no hearing aids  . Dry eyes   . Pneumonia     Past Surgical History  Procedure Laterality Date  . Loop recorder implant  02-08-13; 08-30-13    MDT LinQ implanted by Dr Lovena Le for syncope, explanted 08-30-13 after CHB identified  . Total abdominal hysterectomy w/ bilateral salpingoophorectomy  03-15-2005  . Carpal tunnel release Right   . Eye surgery      cataract, bilat.  . Pacemaker insertion  08-30-2013    MDT ADDRL1 pacemaker implanted by Dr Rayann Heman for CHB    Family History  Problem Relation Age of Onset  . Kidney disease Mother     Social History:  reports that she has never smoked. She has never used smokeless tobacco. She  reports that she does not drink alcohol or use illicit drugs.  Allergies:  Allergies  Allergen Reactions  . Amlodipine Other (See Comments)    Peripheral edema with 2.69m daily dose  . Chlorthalidone Other (See Comments)    Hyponatremia  . Verapamil Other (See Comments)    Bradycardia - HR  35-45 on 441mTID  . Adhesive [Tape] Rash and Other (See Comments)    Skin irritation  . Honey Bee Treatment [Bee Venom] Rash    Rash     Medications: I have reviewed the patient's current medications.  Results for orders placed during the hospital encounter of 08/29/13 (from the past 48 hour(s))  CBC WITH DIFFERENTIAL     Status: Abnormal   Collection Time    08/29/13  6:26 PM      Result Value Ref Range   WBC 11.1 (*) 4.0 - 10.5 K/uL   RBC 3.25 (*) 3.87 - 5.11 MIL/uL   Hemoglobin 10.4 (*) 12.0 - 15.0 g/dL   HCT 31.8 (*) 36.0 - 46.0 %   MCV 97.8  78.0 - 100.0 fL  MCH 32.0  26.0 - 34.0 pg   MCHC 32.7  30.0 - 36.0 g/dL   RDW 13.4  11.5 - 15.5 %   Platelets 181  150 - 400 K/uL   Neutrophils Relative % 75  43 - 77 %   Neutro Abs 8.3 (*) 1.7 - 7.7 K/uL   Lymphocytes Relative 14  12 - 46 %   Lymphs Abs 1.5  0.7 - 4.0 K/uL   Monocytes Relative 11  3 - 12 %   Monocytes Absolute 1.2 (*) 0.1 - 1.0 K/uL   Eosinophils Relative 0  0 - 5 %   Eosinophils Absolute 0.0  0.0 - 0.7 K/uL   Basophils Relative 0  0 - 1 %   Basophils Absolute 0.0  0.0 - 0.1 K/uL  BASIC METABOLIC PANEL     Status: Abnormal   Collection Time    08/29/13  6:26 PM      Result Value Ref Range   Sodium 134 (*) 137 - 147 mEq/L   Potassium 5.1  3.7 - 5.3 mEq/L   Chloride 98  96 - 112 mEq/L   CO2 25  19 - 32 mEq/L   Glucose, Bld 105 (*) 70 - 99 mg/dL   BUN 33 (*) 6 - 23 mg/dL   Creatinine, Ser 1.31 (*) 0.50 - 1.10 mg/dL   Calcium 8.8  8.4 - 10.5 mg/dL   GFR calc non Af Amer 34 (*) >90 mL/min   GFR calc Af Amer 40 (*) >90 mL/min   Comment: (NOTE)     The eGFR has been calculated using the CKD EPI equation.     This  calculation has not been validated in all clinical situations.     eGFR's persistently <90 mL/min signify possible Chronic Kidney     Disease.  TROPONIN I     Status: None   Collection Time    08/29/13  6:26 PM      Result Value Ref Range   Troponin I <0.30  <0.30 ng/mL   Comment:            Due to the release kinetics of cTnI,     a negative result within the first hours     of the onset of symptoms does not rule out     myocardial infarction with certainty.     If myocardial infarction is still suspected,     repeat the test at appropriate intervals.  PROTIME-INR     Status: None   Collection Time    08/29/13  6:26 PM      Result Value Ref Range   Prothrombin Time 13.6  11.6 - 15.2 seconds   INR 1.06  0.00 - 1.49  APTT     Status: None   Collection Time    08/29/13  6:26 PM      Result Value Ref Range   aPTT 32  24 - 37 seconds  MAGNESIUM     Status: None   Collection Time    08/29/13  7:36 PM      Result Value Ref Range   Magnesium 2.3  1.5 - 2.5 mg/dL  URINALYSIS, ROUTINE W REFLEX MICROSCOPIC     Status: Abnormal   Collection Time    08/29/13  9:42 PM      Result Value Ref Range   Color, Urine YELLOW  YELLOW   APPearance CLOUDY (*) CLEAR   Specific Gravity, Urine 1.015  1.005 - 1.030   pH 5.5  5.0 - 8.0   Glucose, UA NEGATIVE  NEGATIVE mg/dL   Hgb urine dipstick SMALL (*) NEGATIVE   Bilirubin Urine NEGATIVE  NEGATIVE   Ketones, ur NEGATIVE  NEGATIVE mg/dL   Protein, ur 100 (*) NEGATIVE mg/dL   Urobilinogen, UA 0.2  0.0 - 1.0 mg/dL   Nitrite NEGATIVE  NEGATIVE   Leukocytes, UA MODERATE (*) NEGATIVE  URINE MICROSCOPIC-ADD ON     Status: Abnormal   Collection Time    08/29/13  9:42 PM      Result Value Ref Range   Squamous Epithelial / LPF RARE  RARE   WBC, UA TOO NUMEROUS TO COUNT  <3 WBC/hpf   RBC / HPF 3-6  <3 RBC/hpf   Bacteria, UA MANY (*) RARE   Casts GRANULAR CAST (*) NEGATIVE  MRSA PCR SCREENING     Status: None   Collection Time    08/29/13  9:43 PM       Result Value Ref Range   MRSA by PCR NEGATIVE  NEGATIVE   Comment:            The GeneXpert MRSA Assay (FDA     approved for NASAL specimens     only), is one component of a     comprehensive MRSA colonization     surveillance program. It is not     intended to diagnose MRSA     infection nor to guide or     monitor treatment for     MRSA infections.  PRO B NATRIURETIC PEPTIDE     Status: Abnormal   Collection Time    08/30/13  5:00 AM      Result Value Ref Range   Pro B Natriuretic peptide (BNP) 8447.0 (*) 0 - 450 pg/mL  CBC     Status: Abnormal   Collection Time    08/30/13  5:04 AM      Result Value Ref Range   WBC 8.5  4.0 - 10.5 K/uL   RBC 3.10 (*) 3.87 - 5.11 MIL/uL   Hemoglobin 9.7 (*) 12.0 - 15.0 g/dL   HCT 30.3 (*) 36.0 - 46.0 %   MCV 97.7  78.0 - 100.0 fL   MCH 31.3  26.0 - 34.0 pg   MCHC 32.0  30.0 - 36.0 g/dL   RDW 13.7  11.5 - 15.5 %   Platelets 170  150 - 400 K/uL  COMPREHENSIVE METABOLIC PANEL     Status: Abnormal   Collection Time    08/30/13  5:04 AM      Result Value Ref Range   Sodium 136 (*) 137 - 147 mEq/L   Potassium 4.5  3.7 - 5.3 mEq/L   Chloride 98  96 - 112 mEq/L   CO2 27  19 - 32 mEq/L   Glucose, Bld 89  70 - 99 mg/dL   BUN 34 (*) 6 - 23 mg/dL   Creatinine, Ser 1.37 (*) 0.50 - 1.10 mg/dL   Calcium 8.7  8.4 - 10.5 mg/dL   Total Protein 6.0  6.0 - 8.3 g/dL   Albumin 2.7 (*) 3.5 - 5.2 g/dL   AST 43 (*) 0 - 37 U/L   ALT 93 (*) 0 - 35 U/L   Alkaline Phosphatase 101  39 - 117 U/L   Total Bilirubin 0.4  0.3 - 1.2 mg/dL   GFR calc non Af Amer 32 (*) >90 mL/min   GFR calc Af Amer 38 (*) >90 mL/min   Comment: (NOTE)  The eGFR has been calculated using the CKD EPI equation.     This calculation has not been validated in all clinical situations.     eGFR's persistently <90 mL/min signify possible Chronic Kidney     Disease.  SEDIMENTATION RATE     Status: Abnormal   Collection Time    08/30/13  5:04 AM      Result Value Ref Range    Sed Rate 48 (*) 0 - 22 mm/hr  URIC ACID     Status: Abnormal   Collection Time    08/30/13  5:04 AM      Result Value Ref Range   Uric Acid, Serum 9.3 (*) 2.4 - 7.0 mg/dL  TSH     Status: Abnormal   Collection Time    08/30/13  5:04 AM      Result Value Ref Range   TSH 5.560 (*) 0.350 - 4.500 uIU/mL   Comment: Please note change in reference range.    Dg Chest 2 View  08/31/2013   CLINICAL DATA:  Status post device implantation.  EXAM: CHEST  2 VIEW  COMPARISON:  08/29/2013  FINDINGS: There is a left chest wall pacer device with lead in the right atrial appendage and right ventricle. There is mild cardiac enlargement. No pneumothorax identified after pacer placement. Bilateral pleural effusions and moderate to marked interstitial edema has increased from previous exam.  IMPRESSION: 1. No complication status post pacer placement. 2. Progression of CHF.   Electronically Signed   By: Kerby Moors M.D.   On: 08/31/2013 08:34   Dg Chest Port 1 View  08/29/2013   CLINICAL DATA:  Shortness of breath and fever, history of heart block and COPD  EXAM: PORTABLE CHEST - 1 VIEW  COMPARISON:  DG CHEST 1 VIEW dated 02/08/2013  FINDINGS: The lungs are adequately inflated. The interstitial markings are increased bilaterally. This is not entirely new but it is more conspicuous than on the previous study. The cardiopericardial silhouette is enlarged. The pulmonary vascularity is prominent centrally and mild cephalization is suspected. The trachea is midline. There is no pleural effusion or pneumothorax. There is a small amount of apical pleural thickening bilaterally. The observed portions of the bony thorax exhibit no acute abnormalities.  IMPRESSION: The findings suggest mild interstitial edema likely of cardiac cause. Interstitial pneumonia cannot be absolutely excluded.   Electronically Signed   By: David  Martinique   On: 08/29/2013 19:45    ROS Blood pressure 145/38, pulse 89, temperature 98 F (36.7 C),  temperature source Oral, resp. rate 20, height '4\' 9"'  (1.448 m), weight 65.59 kg (144 lb 9.6 oz), SpO2 97.00%. Physical Exam  Musculoskeletal:       Left ankle: Tenderness. Lateral malleolus, AITFL and posterior TFL tenderness found.  She moves her ankle very easily and shows no evidence of ankle ankle effusion.  There is no ankle instability and no medial tenderness.  Her achilles is intact.  The majority of her pain and swelling is over the lateral aspect of the ankle.  Her skin is intact and there is no redness.  Her foot has normal sensation and is well-perfused.  Assessment/Plan: Left lateral ankle pain and swelling. 1)  She reports only mild pain with this.  It is likely an inflammatory process.  Will check ankle x-rays, but my only recommendation would be full weight-bearing as tolerated and ice with elevation for swelling.  She would also benefit from NSAIDs if allowed by Cards.  If not, then  would place on a 6-day steroid taper (Medrol dose pack).  Mcarthur Rossetti 08/31/2013, 11:43 AM

## 2013-09-01 DIAGNOSIS — I10 Essential (primary) hypertension: Secondary | ICD-10-CM

## 2013-09-01 DIAGNOSIS — I509 Heart failure, unspecified: Secondary | ICD-10-CM

## 2013-09-01 DIAGNOSIS — F0280 Dementia in other diseases classified elsewhere without behavioral disturbance: Secondary | ICD-10-CM

## 2013-09-01 DIAGNOSIS — I5032 Chronic diastolic (congestive) heart failure: Secondary | ICD-10-CM | POA: Diagnosis present

## 2013-09-01 DIAGNOSIS — I5031 Acute diastolic (congestive) heart failure: Secondary | ICD-10-CM

## 2013-09-01 DIAGNOSIS — M19079 Primary osteoarthritis, unspecified ankle and foot: Secondary | ICD-10-CM | POA: Diagnosis not present

## 2013-09-01 DIAGNOSIS — G309 Alzheimer's disease, unspecified: Secondary | ICD-10-CM

## 2013-09-01 HISTORY — DX: Chronic diastolic (congestive) heart failure: I50.32

## 2013-09-01 LAB — URIC ACID: Uric Acid, Serum: 9.7 mg/dL — ABNORMAL HIGH (ref 2.4–7.0)

## 2013-09-01 MED ORDER — IBUPROFEN 200 MG PO TABS: 200.0000 mg | ORAL_TABLET | Freq: Three times a day (TID) | ORAL | Status: DC

## 2013-09-01 MED ORDER — FUROSEMIDE 10 MG/ML IJ SOLN
40.0000 mg | Freq: Once | INTRAMUSCULAR | Status: AC
  Administered 2013-09-01: 40 mg via INTRAVENOUS
  Filled 2013-09-01: qty 4

## 2013-09-01 NOTE — Progress Notes (Signed)
Patient ID: Jordan [name], female   DOB: 04-Aug-1919, 80 y.o.   MRN: [id number] I reviewed [name] ankle x-rays and there is no evidence of fracture. This seems to be an inflammatory process causing her ankle swelling.  It seems to involve the lateral ankle and the peroneal tendons only.  It does not involve the ankle joint and there is no evidence of infection and now evidence of an ankle effusion.  Her exam itself yesterday only showed some mild swelling and lateral tenderness, but no redness and her range-of-motion was full and essentially painless.  I spoke in length to her daughter who did not seem satisfied with my response given her previous elbow issues.  She feels this needs to be worked up further and that antibiotics have helped in the past.  Again, this does not appear to be cellulitis nor an infection and I explained that people with cardiac and/or renal issues can get inflammation such as this.  Even if it were gout, the treatment would be the same - NSAIDs or oral steroids.  A uric acid level could be helpful.  Her recent WBC was also normal.  Ice and elevation could also help as well as an ace wrap periodically around her ankle.

## 2013-09-01 NOTE — Progress Notes (Signed)
Physical Therapy Treatment Patient Details Name: Jordan [name] MRN: [id number] DOB: December 02, 1919 Today's Date: 09/01/2013    History of Present Illness Pt s/p pacemaker insertion    PT Comments    Pt with improved transfer and ambulation ability and tolerance. Pt however remains to require assist for transfers and ADLs.  Family expressed concern regarding the level of care pt currently needs and do not feel they can care for her properly at home. Pt to benefit from ST-SNF to achieve PLOF for safe transition home with 24/7 supervision/assist.   Follow Up Recommendations  SNF     Equipment Recommendations  None recommended by PT    Recommendations for Other Services       Precautions / Restrictions Precautions Precautions: ICD/Pacemaker Precaution Comments: educated on no overhead ROM Required Braces or Orthoses: Sling (for comfort only) Restrictions Weight Bearing Restrictions: No    Mobility  Bed Mobility Overal bed mobility: Needs Assistance Bed Mobility: Supine to Sit     Supine to sit: Mod assist     General bed mobility comments: HOB elevated, assist only for trunk, pt able to move LEs off EOB  Transfers Overall transfer level: Needs assistance Equipment used: Rolling walker (2 wheeled) Transfers: Sit to/from Stand Sit to Stand: Min assist         General transfer comment: pt slow and guarded. pt con't with posterior bias however improved from yesterday  Ambulation/Gait Ambulation/Gait assistance: Mod assist Ambulation Distance (Feet): 10 Feet Assistive device: Rolling walker (2 wheeled) Gait Pattern/deviations: Step-to pattern;Shuffle Gait velocity: slow Gait velocity interpretation: <1.8 ft/sec, indicative of risk for recurrent falls General Gait Details: pt with nausea limited amb tolerance. pt required modA to initiate anterior weight-shift for walker management   Stairs            Wheelchair Mobility    Modified Rankin (Stroke Patients  Only)       Balance Overall balance assessment: Needs assistance Sitting-balance support: Feet supported Sitting balance-Leahy Scale: Fair     Standing balance support: Bilateral upper extremity supported Standing balance-Leahy Scale: Poor                      Cognition Arousal/Alertness: Awake/alert Behavior During Therapy: WFL for tasks assessed/performed Overall Cognitive Status: Within Functional Limits for tasks assessed                      Exercises      General Comments General comments (skin integrity, edema, etc.): pt assist with donning underwear in sittnig/standing      Pertinent Vitals/Pain Did not report pain    Home Living                      Prior Function            PT Goals (current goals can now be found in the care plan section) Progress towards PT goals: Progressing toward goals    Frequency  Min 3X/week    PT Plan Discharge plan needs to be updated    Co-evaluation             End of Session Equipment Utilized During Treatment: Gait belt Activity Tolerance: Patient limited by fatigue Patient left: in chair;with call bell/phone within reach;with family/visitor present     Time: [id number] PT Time Calculation (min): 23 min  Charges:  $Gait Training: 8-22 mins $Therapeutic Activity: 8-22 mins  G CodesKoleen Distance Hermena Swint 10/01/2013, 2:55 PM  Kittie Plater, PT, DPT Pager #: [phone] Office #: [phone]

## 2013-09-01 NOTE — Progress Notes (Signed)
Patient ID: Jordan [name], female   DOB: [birth year], 80 y.o.   MRN: [id number] I did see her Uric Acid level on 5/1 was 9.3, which is indicative of gout.  Will see what today's level is. This points more to an inflammatory process.

## 2013-09-01 NOTE — Progress Notes (Addendum)
SUBJECTIVE:  No compliants  OBJECTIVE:   Vitals:   Filed Vitals:   08/31/13 1000 08/31/13 1238 08/31/13 2100 09/01/13 0524  BP: 145/129 136/51 153/51 137/45  Pulse:  78 85 61  Temp:  97.9 F (36.6 C) 98.6 F (37 C) 98.6 F (37 C)  TempSrc:  Oral    Resp:  16 18 18   Height:      Weight:    141 lb (63.957 kg)  SpO2:  97% 98% 97%   I&O's:   Intake/Output Summary (Last 24 hours) at 09/01/13 1235 Last data filed at 09/01/13 0418  Gross per 24 hour  Intake      3 ml  Output   1950 ml  Net  -1947 ml   TELEMETRY: Reviewed telemetry pt in AV paced:     PHYSICAL EXAM General: Well developed, well nourished, in no acute distress Head: Eyes PERRLA, No xanthomas.   Normal cephalic and atramatic  Lungs:   Crackles at bases bilaterally Heart:   HRRR S1 S2 Pulses are 2+ & equal. Abdomen: Bowel sounds are positive, abdomen soft and non-tender without masses  Extremities:   No clubbing, cyanosis or edema.  DP +1 Neuro: Alert and oriented X 3. Psych:  Good affect, responds appropriately   LABS: Basic Metabolic Panel:  Recent Labs  08/29/13 1826 08/29/13 1936 08/30/13 0504  NA 134*  --  136*  K 5.1  --  4.5  CL 98  --  98  CO2 25  --  27  GLUCOSE 105*  --  89  BUN 33*  --  34*  CREATININE 1.31*  --  1.37*  CALCIUM 8.8  --  8.7  MG  --  2.3  --    Liver Function Tests:  Recent Labs  08/30/13 0504  AST 43*  ALT 93*  ALKPHOS 101  BILITOT 0.4  PROT 6.0  ALBUMIN 2.7*   No results found for this basename: LIPASE, AMYLASE,  in the last 72 hours CBC:  Recent Labs  08/29/13 1826 08/30/13 0504  WBC 11.1* 8.5  NEUTROABS 8.3*  --   HGB 10.4* 9.7*  HCT 31.8* 30.3*  MCV 97.8 97.7  PLT 181 170   Cardiac Enzymes:  Recent Labs  08/29/13 1826  TROPONINI <0.30   BNP: No components found with this basename: POCBNP,  D-Dimer: No results found for this basename: DDIMER,  in the last 72 hours Hemoglobin A1C: No results found for this basename: HGBA1C,  in the  last 72 hours Fasting Lipid Panel: No results found for this basename: CHOL, HDL, LDLCALC, TRIG, CHOLHDL, LDLDIRECT,  in the last 72 hours Thyroid Function Tests:  Recent Labs  08/30/13 0504  TSH 5.560*   Anemia Panel: No results found for this basename: VITAMINB12, FOLATE, FERRITIN, TIBC, IRON, RETICCTPCT,  in the last 72 hours Coag Panel:   Lab Results  Component Value Date   INR 1.06 08/29/2013   INR 0.99 07/01/2010   INR 0.92 06/30/2010    RADIOLOGY: Dg Chest 2 View  08/31/2013   CLINICAL DATA:  Status post device implantation.  EXAM: CHEST  2 VIEW  COMPARISON:  08/29/2013  FINDINGS: There is a left chest wall pacer device with lead in the right atrial appendage and right ventricle. There is mild cardiac enlargement. No pneumothorax identified after pacer placement. Bilateral pleural effusions and moderate to marked interstitial edema has increased from previous exam.  IMPRESSION: 1. No complication status post pacer placement. 2. Progression of CHF.  Electronically Signed   By: Kerby Moors M.D.   On: 08/31/2013 08:34   Dg Elbow Complete Right  08/07/2013   CLINICAL DATA:  Right elbow pain and discomfort for 1 day  EXAM: RIGHT ELBOW - COMPLETE 3+ VIEW  COMPARISON:  None  FINDINGS: No displaced fracture or elbow fusion. There is minimal enthesopathic change about the medial and lateral epicondyles. Joint spaces are grossly preserved. There is mild soft tissue swelling about the posterior aspect of the elbow without associated radiopaque foreign body.  IMPRESSION: Mild soft tissue swelling about the posterior aspect of the elbow without associated displaced fracture or elbow joint effusion.   Electronically Signed   By: Sandi Mariscal M.D.   On: 08/07/2013 17:02   Dg Ankle Complete Left  08/31/2013   CLINICAL DATA:  Pain and swelling.  EXAM: LEFT ANKLE COMPLETE - 3+ VIEW  COMPARISON:  None.  FINDINGS: Diffuse osteopenia and degenerative change present. No evidence of fracture or dislocation.  Peripheral vascular calcifications present.  IMPRESSION: 1.  Soft tissue swelling.  No acute bony or joint abnormality.  2.  Diffuse osteopenia.  3.  Peripheral vascular disease.   Electronically Signed   By: Marcello Moores  Register   On: 08/31/2013 15:43   Dg Chest Port 1 View  08/29/2013   CLINICAL DATA:  Shortness of breath and fever, history of heart block and COPD  EXAM: PORTABLE CHEST - 1 VIEW  COMPARISON:  DG CHEST 1 VIEW dated 02/08/2013  FINDINGS: The lungs are adequately inflated. The interstitial markings are increased bilaterally. This is not entirely new but it is more conspicuous than on the previous study. The cardiopericardial silhouette is enlarged. The pulmonary vascularity is prominent centrally and mild cephalization is suspected. The trachea is midline. There is no pleural effusion or pneumothorax. There is a small amount of apical pleural thickening bilaterally. The observed portions of the bony thorax exhibit no acute abnormalities.  IMPRESSION: The findings suggest mild interstitial edema likely of cardiac cause. Interstitial pneumonia cannot be absolutely excluded.   Electronically Signed   By: David  Martinique   On: 08/29/2013 19:45   ASSESSMENT AND PLAN:  Principal Problem:  Second degree AV block, Mobitz type II  Active Problems:  HTN (hypertension)  Alzheimer's dementia  Near syncope  Weakness  Mobitz type 2 second degree heart block   1. Complete heart block  Normal pacemaker function  No changes today  Routine wound care and follow-up  2. Acute diastolic chf/ chronic renal failure  Volume overloaded on exam  Will give IV lasix x 1 today  3. Headache/ nausea  zofran  Family reports that percocet has done better than vycodin. I have therefore made this substituion  4. Hypothyroid  Increased synthroid today to 125 mcg daily  Need to follow-up with pcp  5. L ankle swelling  ? due to gout  Will continue colchicine  Ortho felt secondary to inflammatory process. Currently  on Voltaren gel.  Would avoid steroids due to CHF.  Xray showed  Soft tissue swelling. No acute bony or joint abnormality. Diffuse osteopenia. Peripheral vascular disease.  The daughter is quite concerned since patient has had a joint infection in her elbow recently.  She would like to speak personally to the orthopedist. 6. Unsteadiness  PT to assess  Will need SNF vs HHPT    Sueanne Margarita, MD  09/01/2013  12:35 PM

## 2013-09-01 NOTE — Progress Notes (Signed)
Contacted ID to see pt as family very concerned about possibility of joint infection, which she had in the past, in her elbow.   Spoke with Dr. Megan Salon, he recommended a CRP test, will order. They will see in am. ESR was slightly elevated but non-specific, will recheck CBC in am as well.   Lab Results  Component Value Date   ESRSEDRATE 59* 08/30/2013   Lonn Georgia, PA-C 09/01/2013 2:47 PM Beeper [phone]

## 2013-09-02 DIAGNOSIS — N189 Chronic kidney disease, unspecified: Secondary | ICD-10-CM

## 2013-09-02 DIAGNOSIS — I509 Heart failure, unspecified: Secondary | ICD-10-CM | POA: Diagnosis not present

## 2013-09-02 DIAGNOSIS — I440 Atrioventricular block, first degree: Secondary | ICD-10-CM | POA: Diagnosis not present

## 2013-09-02 DIAGNOSIS — I5031 Acute diastolic (congestive) heart failure: Secondary | ICD-10-CM

## 2013-09-02 DIAGNOSIS — M109 Gout, unspecified: Secondary | ICD-10-CM | POA: Diagnosis not present

## 2013-09-02 LAB — C-REACTIVE PROTEIN: CRP: 4 mg/dL — ABNORMAL HIGH (ref <0.60)

## 2013-09-02 LAB — BASIC METABOLIC PANEL
BUN: 27 mg/dL — ABNORMAL HIGH (ref 6–23)
CO2: 33 meq/L — AB (ref 19–32)
CREATININE: 1.23 mg/dL — AB (ref 0.50–1.10)
Calcium: 8.6 mg/dL (ref 8.4–10.5)
Chloride: 95 mEq/L — ABNORMAL LOW (ref 96–112)
GFR calc Af Amer: 43 mL/min — ABNORMAL LOW (ref >90)
GFR calc non Af Amer: 37 mL/min — ABNORMAL LOW (ref >90)
GLUCOSE: 93 mg/dL (ref 70–99)
Potassium: 4.1 mEq/L (ref 3.7–5.3)
Sodium: 137 mEq/L (ref 137–147)

## 2013-09-02 LAB — PRO B NATRIURETIC PEPTIDE: Pro B Natriuretic peptide (BNP): 10568 pg/mL — ABNORMAL HIGH (ref 0–450)

## 2013-09-02 MED ORDER — FUROSEMIDE 40 MG PO TABS
40.0000 mg | ORAL_TABLET | Freq: Every day | ORAL | Status: AC
  Administered 2013-09-02: 40 mg via ORAL
  Filled 2013-09-02: qty 1

## 2013-09-02 MED ORDER — ALLOPURINOL 100 MG PO TABS
100.0000 mg | ORAL_TABLET | Freq: Every day | ORAL | Status: DC
  Administered 2013-09-02 – 2013-09-04 (×3): 100 mg via ORAL
  Filled 2013-09-02 (×3): qty 1

## 2013-09-02 NOTE — Progress Notes (Addendum)
Improved. Will gingerly diurese as renal function allows. Ankle swelling improved on colchicine. UA is 9.7 Ambulate today Stage 3 CKD, will give 1 dose lasix orally today Home soon/tomorrow.

## 2013-09-02 NOTE — Progress Notes (Signed)
Patient Profile: Jordan [name] is a 80 y.o. female with a past medical history of hypertension, hypothyroidism, osteoarthritis, and prior syncope, s/p MDT LinQ implantable loop recorder. She was scheduled for rotator cuff repair but this was cancelled due to bradycardia. She developed weakness and fatigue and was brought to the ER for further evaluation. She was found to be in mobitz II second degree heart block with ventricular rates in the 30's. She was also noted to be volume overloaded at time of admit.  Last echo 01-2013 demonstrated normal EF with no RWMA. IV Lasix given for A/C diastolic CHF. S/p PPM insertion 08/30/13.   Also noted to have a swollen left ankle. Lab work this admission is notable for elevated sed rate and uric acid level   Subjective: Feels tired. Denies chest pain and SOB, although using [place].   Objective: Vital signs in last 24 hours: Temp:  [98.1 F (36.7 C)-98.2 F (36.8 C)] 98.2 F (36.8 C) (05/03 2100) Pulse Rate:  [75-82] 82 (05/03 2100) Resp:  [17-21] 21 (05/03 2100) BP: (139-152)/(43-48) 152/48 mmHg (05/03 2100) SpO2:  [98 %-99 %] 99 % (05/03 2100) Last BM Date: 08/31/13  Intake/Output from previous day: 05/03 0701 - 05/04 0700 In: -  Out: 150 [Urine:150] Intake/Output this shift: Total I/O In: -  Out: 150 [Urine:150]  Medications Current Facility-Administered Medications  Medication Dose Route Frequency Provider Last Rate Last Dose  . 0.9 %  sodium chloride infusion  250 mL Intravenous PRN [name], MD      . acetaminophen (TYLENOL) tablet 325-650 mg  325-650 mg Oral Q4H PRN [name], MD      . colchicine tablet 0.6 mg  0.6 mg Oral BID Doreene Burke Kilroy, PA-C   0.6 mg at 09/01/13 2210  . cycloSPORINE (RESTASIS) 0.05 % ophthalmic emulsion 1 drop  1 drop Both Eyes Daily Erlene Quan, PA-C   1 drop at 09/01/13 1046  . darifenacin (ENABLEX) 24 hr tablet 7.5 mg  7.5 mg Oral Daily Erlene Quan, PA-C   7.5 mg at 09/01/13 1041  . diclofenac sodium  (VOLTAREN) 1 % transdermal gel 2 g  2 g Topical QID Erlene Quan, PA-C   2 g at 09/01/13 2200  . docusate sodium (COLACE) capsule 100 mg  100 mg Oral BID Erlene Quan, PA-C   100 mg at 09/01/13 2210  . hydrALAZINE (APRESOLINE) tablet 25 mg  25 mg Oral 3 times per day Erlene Quan, PA-C   25 mg at 09/01/13 2210  . latanoprost (XALATAN) 0.005 % ophthalmic solution 1 drop  1 drop Both Eyes QHS Erlene Quan, PA-C   1 drop at 09/01/13 2210  . levothyroxine (SYNTHROID, LEVOTHROID) tablet 125 mcg  125 mcg Oral QAC breakfast [name], MD   125 mcg at 09/01/13 1041  . nitroGLYCERIN (NITROSTAT) SL tablet 0.4 mg  0.4 mg Sublingual Q5 Min x 3 PRN Doreene Burke Kilroy, PA-C      . ondansetron [facility]) injection 4 mg  4 mg Intravenous Q6H PRN [name], MD   4 mg at 08/31/13 [id number]  . oxyCODONE-acetaminophen (PERCOCET/ROXICET) 5-325 MG per tablet 1 tablet  1 tablet Oral Q6H PRN [name], MD   1 tablet at 09/01/13 2227  . polyethylene glycol (MIRALAX / GLYCOLAX) packet 17 g  17 g Oral Daily Erlene Quan, PA-C   17 g at 09/01/13 1045  . sodium chloride 0.9 % injection 3 mL  3 mL Intravenous Q12H Jeneen Rinks  Allred, MD   3 mL at 09/01/13 1046  . sodium chloride 0.9 % injection 3 mL  3 mL Intravenous PRN [name], MD      . traMADol Veatrice Bourbon) tablet 25 mg  25 mg Oral Q12H PRN Erlene Quan, PA-C      . traZODone (DESYREL) tablet 25 mg  25 mg Oral QHS PRN Erlene Quan, PA-C   25 mg at 09/01/13 2227    PE: General appearance: alert, cooperative and no distress Lungs: bibasilar crackles  Heart: regular rate and rhythm Extremities: no LEE Pulses: 2+ and symmetric Skin: warm and dry Neurologic: Grossly normal  Lab Results:  No results found for this basename: WBC, HGB, HCT, PLT,  in the last 72 hours BMET  Recent Labs  09/02/13 0347  NA 137  K 4.1  CL 95*  CO2 33*  GLUCOSE 93  BUN 27*  CREATININE 1.23*  CALCIUM 8.6     Assessment/Plan  Principal Problem:   Second degree AV block, Mobitz  type II Active Problems:   HTN (hypertension)   Alzheimer's dementia   Near syncope   Weakness   Mobitz type 2 second degree heart block   Acute diastolic CHF (congestive heart failure)  1. Complete Heart Block -s/p PPM insertion 08/30/13. Post-op device interrogation demonstrated normal device function. Paced rhythm on telemetry. CXR w/o evidence of pneumothorax.   2. Acute Diastolic CHF - still with bibasilar crackles. Will need further diuresis. Scr 1.23.  3. Left Ankle Swelling - Improved. No overt swelling noted. She denies pain. This is likely gout, in the setting of elevated uric acid levels and improvement with colchicine. However, patient has a h/o joint infection. ID was consulted yesterday and will see today. They have recommended obtaining a CRP. Results pending.     LOS: 4 days    Trine Fread M. Rosita Fire, PA-C 09/02/2013 6:16 AM

## 2013-09-02 NOTE — Progress Notes (Signed)
UR Completed Vedha Tercero Graves-Bigelow, RN,BSN [phone]  

## 2013-09-02 NOTE — Consult Note (Signed)
[facility]  Date of Admission:  08/29/2013  Date of Consult:  09/02/2013  Reason for Consult: Ankle swelling Referring Physician: Barrett/Turner  Impression/Recommendation  Gout CRI CHF Heart Block  Comment: Her elevated CRP as well as her elevated uric acid suggest gout. She's been treated with colchicine and allopurinol as well as NSAIDs. She has improved in the hospital proceeded only minimal without antibiotics, as perioperative coverage for her pacemaker implant. Her blood cultures are no growth to date.  We are available as needed,  Thank you for this consult,   Campbell Riches (pager) [phone] [email or web address]  Jordan [name] is an 80 y.o. female.  HPI: 80 yo F with HTN, OA, bradycardia adm on 4-30 Mobitz II with HR in 30s. She underwent pacer placement on 5-1.  Her hx is also notable for swelling in her right elbow roughly 1 month ago. She was treated with antibiotics by her primary care physician who thought this was an infection and treated her with antibiotics. This was soon followed on April 10 by swelling in her right fifth digit. This also resolved. She was told this was an arthritis flareup. Prior to coming to the hospital on April 30, she developed redness and swelling in both of her ankles. Her left ankle became so tender she was unable to bear weight on it. Since being hospitalized these have resolved. She had plain films done of her left ankle which showed soft tissue swelling, as well as osteopenia.   Past Medical History  Diagnosis Date  . Cervicalgia   . Peripheral neuropathy   . Hypertension   . Osteoporosis   . Insomnia   . Arthritis   . Headache   . Hypothyroidism   . Right rotator cuff tear   . OA (osteoarthritis)     RIGHT SHOULDER AC JOINT  . History of syncope     01/2013  DX  CYRPTOGENIC STROKE  . Moderate dementia   . Gait instability   . First degree heart block   . History of colon polyps   .  Diverticulosis of colon   . SUI (stress urinary incontinence, female)   . Lumbar stenosis   . Status post placement of implantable loop recorder     02-08-2013  . Cryptogenic stroke S/P  LOOP RECORDER IMPLANT    DX  01/2013  --  SYNCOPAL EPISODE --  EPISODIC  ATRIAL TACHY/ FIBULATION  AND PAUSES  . Frequency of urination   . Short of breath on exertion   . Chronic constipation   . Glaucoma of both eyes   . Hearing loss of both ears     no hearing aids  . Dry eyes   . Pneumonia     Past Surgical History  Procedure Laterality Date  . Loop recorder implant  02-08-13; 08-30-13    MDT LinQ implanted by Dr Lovena Le for syncope, explanted 08-30-13 after CHB identified  . Total abdominal hysterectomy w/ bilateral salpingoophorectomy  03-15-2005  . Carpal tunnel release Right   . Eye surgery      cataract, bilat.  . Pacemaker insertion  08-30-2013    MDT ADDRL1 pacemaker implanted by Dr Rayann Heman for CHB     Allergies  Allergen Reactions  . Amlodipine Other (See Comments)    Peripheral edema with 2.51m daily dose  . Chlorthalidone Other (See Comments)    Hyponatremia  . Verapamil Other (See Comments)    Bradycardia - HR  35-45 on 418m  TID  . Adhesive [Tape] Rash and Other (See Comments)    Skin irritation  . Honey Bee Treatment [Bee Venom] Rash    Rash     Medications:  Scheduled: . allopurinol  100 mg Oral Daily  . colchicine  0.6 mg Oral BID  . cycloSPORINE  1 drop Both Eyes Daily  . darifenacin  7.5 mg Oral Daily  . diclofenac sodium  2 g Topical QID  . docusate sodium  100 mg Oral BID  . hydrALAZINE  25 mg Oral 3 times per day  . latanoprost  1 drop Both Eyes QHS  . levothyroxine  125 mcg Oral QAC breakfast  . polyethylene glycol  17 g Oral Daily  . sodium chloride  3 mL Intravenous Q12H    Abtx:  Anti-infectives   Start     Dose/Rate Route Frequency Ordered Stop   08/30/13 1800  ceFAZolin (ANCEF) IVPB 1 g/50 mL premix     1 g 100 mL/hr over 30 Minutes Intravenous Every  6 hours 08/30/13 1648 08/31/13 0548   08/30/13 0830  gentamicin (GARAMYCIN) 80 mg in sodium chloride irrigation 0.9 % 500 mL irrigation  Status:  Discontinued     80 mg Irrigation On call 08/30/13 0826 08/30/13 1128   08/30/13 0830  ceFAZolin (ANCEF) IVPB 2 g/50 mL premix  Status:  Discontinued     2 g 100 mL/hr over 30 Minutes Intravenous On call 08/30/13 0826 08/30/13 1128   08/29/13 2200  cephALEXin (KEFLEX) capsule 250 mg  Status:  Discontinued     250 mg Oral 3 times per day 08/29/13 1948 08/30/13 1648      Total days of antibiotics 2 (Ancef)          Social History:  reports that she has never smoked. She has never used smokeless tobacco. She reports that she does not drink alcohol or use illicit drugs.  Family History  Problem Relation Age of Onset  . Kidney disease Mother     General ROS: No fever or chills. No proximal erythema. She is Spanish-speaking only. Her daughter is here to interpret. See history of present illness.  Blood pressure 133/37, pulse 62, temperature 98.5 F (36.9 C), temperature source Oral, resp. rate 16, height _0  (1.448 m), weight 63.504 kg (140 lb), SpO2 99.00%. General appearance: alert, cooperative and no distress Eyes: negative findings: pupils equal, round, reactive to light and accomodation Throat: normal findings: oropharynx pink & moist without lesions or evidence of thrush Neck: no adenopathy and supple, symmetrical, trachea midline Lungs: clear to auscultation bilaterally Breasts: There is a dressing plate in place the left upper chest. It is cleaned with some dried blood in the dressing. Heart: regular rate and rhythm Abdomen: normal findings: bowel sounds normal and soft, non-tender Extremities: edema None and Her right elbow is nontender noninflamed. There is no increase in heat. Her hands show osteoarthritic changes. There is no swelling of her right fifth digit in relation to her other digits. Her ankles show no edema. They're  nontender they are nonswollen. There is no increase in heat.   Results for orders placed during the hospital encounter of 08/29/13 (from the past 48 hour(s))  URIC ACID     Status: Abnormal   Collection Time    09/01/13  1:15 PM      Result Value Ref Range   Uric Acid, Serum 9.7 (*) 2.4 - 7.0 mg/dL  C-REACTIVE PROTEIN     Status: Abnormal   Collection Time  09/01/13  1:15 PM      Result Value Ref Range   CRP 4.0 (*) <0.60 mg/dL   Comment: Performed at [place]     Status: Abnormal   Collection Time    09/02/13  3:47 AM      Result Value Ref Range   Sodium 137  137 - 147 mEq/L   Potassium 4.1  3.7 - 5.3 mEq/L   Chloride 95 (*) 96 - 112 mEq/L   CO2 33 (*) 19 - 32 mEq/L   Glucose, Bld 93  70 - 99 mg/dL   BUN 27 (*) 6 - 23 mg/dL   Creatinine, Ser 1.23 (*) 0.50 - 1.10 mg/dL   Calcium 8.6  8.4 - 10.5 mg/dL   GFR calc non Af Amer 37 (*) >90 mL/min   GFR calc Af Amer 43 (*) >90 mL/min   Comment: (NOTE)     The eGFR has been calculated using the CKD EPI equation.     This calculation has not been validated in all clinical situations.     eGFR's persistently <90 mL/min signify possible Chronic Kidney     Disease.  PRO B NATRIURETIC PEPTIDE     Status: Abnormal   Collection Time    09/02/13  3:47 AM      Result Value Ref Range   Pro B Natriuretic peptide (BNP) 10568.0 (*) 0 - 450 pg/mL      Component Value Date/Time   SDES BLOOD ARM RIGHT 08/29/2013 2042   SPECREQUEST BOTTLES DRAWN AEROBIC AND ANAEROBIC B 5CC R 4CC 08/29/2013 2042   CULT  Value:        BLOOD CULTURE RECEIVED NO GROWTH TO DATE CULTURE WILL BE HELD FOR 5 DAYS BEFORE ISSUING A FINAL NEGATIVE REPORT Performed at [facility] 08/29/2013 2042   REPTSTATUS PENDING 08/29/2013 2042   No results found. Recent Results (from the past 240 hour(s))  CULTURE, BLOOD (ROUTINE X 2)     Status: None   Collection Time    08/29/13  8:33 PM      Result Value Ref Range Status   Specimen  Description BLOOD ARM LEFT   Final   Special Requests BOTTLES DRAWN AEROBIC AND ANAEROBIC 5CC   Final   Culture  Setup Time     Final   Value: 08/30/2013 00:44     Performed at [facility]   Culture     Final   Value:        BLOOD CULTURE RECEIVED NO GROWTH TO DATE CULTURE WILL BE HELD FOR 5 DAYS BEFORE ISSUING A FINAL NEGATIVE REPORT     Performed at [facility]   Report Status PENDING   Incomplete  CULTURE, BLOOD (ROUTINE X 2)     Status: None   Collection Time    08/29/13  8:42 PM      Result Value Ref Range Status   Specimen Description BLOOD ARM RIGHT   Final   Special Requests BOTTLES DRAWN AEROBIC AND ANAEROBIC B 5CC R 4CC   Final   Culture  Setup Time     Final   Value: 08/30/2013 00:44     Performed at [facility]   Culture     Final   Value:        BLOOD CULTURE RECEIVED NO GROWTH TO DATE CULTURE WILL BE HELD FOR 5 DAYS BEFORE ISSUING A FINAL NEGATIVE REPORT     Performed at [facility]   Report Status PENDING   Incomplete  MRSA PCR SCREENING     Status: None   Collection Time    08/29/13  9:43 PM      Result Value Ref Range Status   MRSA by PCR NEGATIVE  NEGATIVE Final   Comment:            The GeneXpert MRSA Assay (FDA     approved for NASAL specimens     only), is one component of a     comprehensive MRSA colonization     surveillance program. It is not     intended to diagnose MRSA     infection nor to guide or     monitor treatment for     MRSA infections.      09/02/2013, 2:26 PM     LOS: 4 days     **Disclaimer: This note may have been dictated with voice recognition software. Similar sounding words can inadvertently be transcribed and this note may contain transcription errors which may not have been corrected upon publication of note.**

## 2013-09-02 NOTE — Progress Notes (Signed)
Clinical Social Work Department BRIEF PSYCHOSOCIAL ASSESSMENT 09/02/2013  Patient:  Jordan [name], Jordan [name]     Account Number:  [id number]     Admit date:  08/29/2013  Clinical Social Worker:  Adair Laundry  Date/Time:  09/02/2013 11:00 AM  Referred by:  Physician  Date Referred:  09/02/2013 Referred for  SNF Placement   Other Referral:   Interview type:  Family Other interview type:   Spoke with pt daughter Dentist    PSYCHOSOCIAL DATA Living Status:  FAMILY Admitted from facility:   Level of care:   Primary support name:  Jordan [name] [phone] Primary support relationship to patient:  CHILD, ADULT Degree of support available:   Pt has very good support system    CURRENT CONCERNS Current Concerns  Post-Acute Placement   Other Concerns:    SOCIAL WORK ASSESSMENT / PLAN CSW aware of PT recommendation for SNF. CSW spoke with pt daughter Jordan [name] about ST rehab. Pt daughter informed CSW pt was staying with family prior to admission but they were aware of recommendation. Pt daughter has some concerns about SNF as she is unaware of facilities but after speaking with pt and other family members they have decided SNF would be best and safest dc option. CSW explained SN[facility] referral process to [facility]. She is agreeable to pt being faxed out to [facility]. Pt daughter wanted to confirm insurance converage. CSW explained that facilities will only make bed offers if they work with pt insurance, but pt insurance is Medicare so this will not be an issue.   Assessment/plan status:  Psychosocial Support/Ongoing Assessment of Needs Other assessment/ plan:   Information/referral to community resources:   SNF list to be provided with bed offers    PATIENT'S/FAMILY'S RESPONSE TO PLAN OF CARE: Pt daughter in agreement that pt needs ST rehab       Jordan [name], Jordan [name]

## 2013-09-02 NOTE — Care Management Note (Addendum)
    Page 1 of 1   09/04/2013     2:39:46 PM CARE MANAGEMENT NOTE 09/04/2013  Patient:  Jordan [name], Jordan [name]   Account Number:  [id number]  Date Initiated:  09/02/2013  Documentation initiated by:  GRAVES-BIGELOW,Kassey Laforest  Subjective/Objective Assessment:   Pt admitted with symptomatic bradycardia and plan will be for d/c to SNF once medically stable for d/c.     Action/Plan:   CSW to assist with disposition needs.   Anticipated DC Date:  09/03/2013   Anticipated DC Plan:  SKILLED NURSING FACILITY  In-house referral  Clinical Social Worker      DC Planning Services  CM consult      Choice offered to / List presented to:             Status of service:  Completed, signed off Medicare Important Message given?  YES (If response is "NO", the following Medicare IM given date fields will be blank) Date Medicare IM given:  08/29/2013 Date Additional Medicare IM given:    Discharge Disposition:  [facility]  Per UR Regulation:  Reviewed for med. necessity/level of care/duration of stay  If discussed at [place] of Stay Meetings, dates discussed:    Comments:

## 2013-09-02 NOTE — Progress Notes (Addendum)
Clinical Social Work Department CLINICAL SOCIAL WORK PLACEMENT NOTE 09/02/2013  Patient:  Jordan [name], Jordan [name]  Account Number:  [id number] Admit date:  08/29/2013  Clinical Social Worker:  Berton Mount, Latanya Presser  Date/time:  09/02/2013 11:00 AM  Clinical Social Work is seeking post-discharge placement for this patient at the following level of care:   [place]   (*CSW will update this form in Epic as items are completed)   09/02/2013  Patient/family provided with [facility] Department of Clinical Social Work's list of facilities offering this level of care within the geographic area requested by the patient (or if unable, by the patient's family).  09/02/2013  Patient/family informed of their freedom to choose among providers that offer the needed level of care, that participate in Medicare, Medicaid or managed care program needed by the patient, have an available bed and are willing to accept the patient.  09/02/2013  Patient/family informed of [facility]' ownership interest in [facility], as well as of the fact that they are under no obligation to receive care at this facility.  PASARR submitted to EDS on 09/02/2013 PASARR number received from EDS on 09/02/2013  FL2 transmitted to all facilities in geographic area requested by pt/family on  09/02/2013 FL2 transmitted to all facilities within larger geographic area on   Patient informed that his/her managed care company has contracts with or will negotiate with  certain facilities, including the following:     Patient/family informed of bed offers received:  09/02/2013 Patient chooses bed at  Physician recommends and patient chooses bed at  [facility]  Patient to be transferred to  on  09/04/2013 Patient to be transferred to facility by [facility]  The following physician request were entered in Epic:   Additional Comments:   [place], [place]

## 2013-09-02 NOTE — Clinical Documentation Improvement (Signed)
Possible Clinical Conditions?   _______CKD Stage III - GFR 30-59 _______CKD Stage IV - GFR 15-29 _______CKD Stage V - GFR < 15 _______Other condition _______Cannot Clinically determine     Risk Factors:  Patient with a history of kidney disease noted per 4/30 H&P.  Diagnostics:  5/04:  Bun: 27.                    Creat: 1.23.                  GFR: 37. 5/01:  Bun: 34.                    Creat: 1.37.                  GFR: 32.   Thank You, Theron Arista, Clinical Documentation Specialist:  [phone]  [place] [facility] Information Management

## 2013-09-02 NOTE — Progress Notes (Signed)
Patient ID: ED ZIMMERLY, female   DOB: May 17, 1919, 80 y.o.   MRN: [id number] She denies any ankle pain this am.  Her swelling over the left lateral ankle is very minimal and her motion is painless.  There is no redness. Her uric acid levels are high consistent with gout.  Currently on NSAIDs.  No further recs.

## 2013-09-02 NOTE — Consult Note (Signed)
error 

## 2013-09-03 DIAGNOSIS — I509 Heart failure, unspecified: Secondary | ICD-10-CM | POA: Diagnosis not present

## 2013-09-03 DIAGNOSIS — N183 Chronic kidney disease, stage 3 unspecified: Secondary | ICD-10-CM

## 2013-09-03 DIAGNOSIS — N1832 Chronic kidney disease, stage 3b: Secondary | ICD-10-CM

## 2013-09-03 DIAGNOSIS — F0280 Dementia in other diseases classified elsewhere without behavioral disturbance: Secondary | ICD-10-CM | POA: Diagnosis not present

## 2013-09-03 DIAGNOSIS — M109 Gout, unspecified: Secondary | ICD-10-CM

## 2013-09-03 DIAGNOSIS — I5031 Acute diastolic (congestive) heart failure: Secondary | ICD-10-CM | POA: Diagnosis not present

## 2013-09-03 HISTORY — DX: Chronic kidney disease, stage 3 unspecified: N18.30

## 2013-09-03 LAB — BASIC METABOLIC PANEL
BUN: 24 mg/dL — AB (ref 6–23)
CHLORIDE: 93 meq/L — AB (ref 96–112)
CO2: 32 mEq/L (ref 19–32)
Calcium: 8.6 mg/dL (ref 8.4–10.5)
Creatinine, Ser: 1.05 mg/dL (ref 0.50–1.10)
GFR calc Af Amer: 52 mL/min — ABNORMAL LOW (ref >90)
GFR calc non Af Amer: 45 mL/min — ABNORMAL LOW (ref >90)
GLUCOSE: 96 mg/dL (ref 70–99)
POTASSIUM: 4.2 meq/L (ref 3.7–5.3)
Sodium: 135 mEq/L — ABNORMAL LOW (ref 137–147)

## 2013-09-03 LAB — CBC
HEMATOCRIT: 32.6 % — AB (ref 36.0–46.0)
HEMOGLOBIN: 10.6 g/dL — AB (ref 12.0–15.0)
MCH: 31.5 pg (ref 26.0–34.0)
MCHC: 32.5 g/dL (ref 30.0–36.0)
MCV: 97 fL (ref 78.0–100.0)
Platelets: 158 10*3/uL (ref 150–400)
RBC: 3.36 MIL/uL — ABNORMAL LOW (ref 3.87–5.11)
RDW: 13.7 % (ref 11.5–15.5)
WBC: 8.5 10*3/uL (ref 4.0–10.5)

## 2013-09-03 MED ORDER — FUROSEMIDE 20 MG PO TABS
20.0000 mg | ORAL_TABLET | Freq: Every day | ORAL | Status: DC
  Administered 2013-09-04: 20 mg via ORAL
  Filled 2013-09-03 (×2): qty 1

## 2013-09-03 MED ORDER — FUROSEMIDE 10 MG/ML IJ SOLN
20.0000 mg | Freq: Once | INTRAMUSCULAR | Status: AC
  Administered 2013-09-03: 20 mg via INTRAVENOUS

## 2013-09-03 NOTE — Progress Notes (Addendum)
       [name] Date of Encounter: 09/03/2013    SUBJECTIVE:Feels better. Gout better. Appreciate ID input.  TELEMETRY:  NSR Filed Vitals:   09/02/13 1450 09/02/13 1642 09/02/13 2100 09/03/13 0500  BP: 122/44  155/58 139/36  Pulse: 80  91 68  Temp: 98.2 F (36.8 C)  98.1 F (36.7 C) 98.8 F (37.1 C)  TempSrc: Oral     Resp: 17  18 16   Height:      Weight:    138 lb 9.6 oz (62.869 kg)  SpO2: 92% 94% 100% 92%    Intake/Output Summary (Last 24 hours) at 09/03/13 1118 Last data filed at 09/03/13 1000  Gross per 24 hour  Intake      0 ml  Output   1050 ml  Net  -1050 ml   LABS: Basic Metabolic Panel:  Recent Labs  09/02/13 0347 09/03/13 0748  NA 137 135*  K 4.1 4.2  CL 95* 93*  CO2 33* 32  GLUCOSE 93 96  BUN 27* 24*  CREATININE 1.23* 1.05  CALCIUM 8.6 8.6   BNP    Component Value Date/Time   PROBNP 10568.0* 09/02/2013 0347     Radiology/Studies:  No new data  Physical Exam: Blood pressure 139/36, pulse 68, temperature 98.8 F (37.1 C), temperature source Oral, resp. rate 16, height 4\' 9"  (1.448 m), weight 138 lb 9.6 oz (62.869 kg), SpO2 92.00%. Weight change: -1 lb 6.4 oz (-0.635 kg)  Wt Readings from Last 3 Encounters:  09/03/13 138 lb 9.6 oz (62.869 kg)  09/03/13 138 lb 9.6 oz (62.869 kg)  08/27/13 144 lb (65.318 kg)    Mild JVD Chest with basilar rales Cardiac exam  ASSESSMENT:  1. Heart block s/p pacer. STABLE 2. Inflammatory arthritis, likely gout, improved on anti-inflammatory therapy 3. Acute diastolic HF, improved with diuresis >990m cc negative 4. Acute/chronic kidney disease with improved creat with diuresis.  Plan:  1. Potential discharge home today if daughter agrees 2. Lasix 20 mg po now, and daily for 7 days  Signed, Belva Crome III 09/03/2013, 11:18 AM  ADDENDUM:  After a long and painful conversation for the patient and I, we have decided SNF for rehab and discharge later in the week when daughter feels  she is ready to go.. Clinically she is ready to go. She has lost faith in her ability to get appropriate attention as an OP due to difficulty with contacting her physicians.  40 minute visit.

## 2013-09-03 NOTE — Progress Notes (Signed)
Physical Therapy Treatment Patient Details Name: Jordan [name] MRN: [id number] DOB: April 20, 1920 Today's Date: 09/03/2013    History of Present Illness Pt s/p pacemaker insertion    PT Comments    Pt progressing with mobility at this date.  Bed mobility & transfers are difficult due to shoulder pain per pt.  Once pt is standing, she moves fairly well just slow.  Pt able to increase ambulation distance this session.  Cont with current POC & d/c recommendation of SNF.     Follow Up Recommendations  SNF     Equipment Recommendations  None recommended by PT    Recommendations for Other Services       Precautions / Restrictions Precautions Precautions: ICD/Pacemaker Precaution Comments: educated on no overhead ROM Required Braces or Orthoses: Sling (for comfort only) Restrictions Weight Bearing Restrictions: No    Mobility  Bed Mobility Overal bed mobility: Needs Assistance Bed Mobility: Rolling;Sidelying to Sit Rolling: Supervision Sidelying to sit: Mod assist       General bed mobility comments: (A) to elevate shoulders/trunk to sitting upright due to difficulty using UE's due to shoulder pain  Transfers Overall transfer level: Needs assistance Equipment used: Rolling walker (2 wheeled) Transfers: Sit to/from [facility] Sit to Stand: Mod assist Stand pivot transfers: Min assist       General transfer comment: (A) to achieve standing, transition weight forwards over BOS, RW management during pivot, & controlled descent. Pt with poor use of UE's due to shoulder pain.   Ambulation/Gait Ambulation/Gait assistance: Min guard Ambulation Distance (Feet): 30 Feet Assistive device: Rolling walker (2 wheeled) Gait Pattern/deviations: Step-through pattern;Decreased stride length;Shuffle Gait velocity: slow   General Gait Details: Cues for safe use of RW & to avoid obstacles   Stairs            Wheelchair Mobility    Modified Rankin (Stroke  Patients Only)       Balance           Standing balance support: Single extremity supported Standing balance-Leahy Scale: Poor                      Cognition Arousal/Alertness: Awake/alert Behavior During Therapy: WFL for tasks assessed/performed Overall Cognitive Status: Within Functional Limits for tasks assessed                      Exercises      General Comments General comments (skin integrity, edema, etc.): Pt required total (A) for pericare after using BSC      Pertinent Vitals/Pain Reports bil shoulder pain R>L.      Home Living                      Prior Function            PT Goals (current goals can now be found in the care plan section) Acute Rehab PT Goals PT Goal Formulation: With patient Time For Goal Achievement: 09/14/13 Potential to Achieve Goals: Good Progress towards PT goals: Progressing toward goals    Frequency  Min 3X/week    PT Plan Current plan remains appropriate    Co-evaluation             End of Session Equipment Utilized During Treatment: Gait belt Activity Tolerance: Patient tolerated treatment well Patient left: in chair;with call bell/phone within reach;with family/visitor present     Time: [id number] PT Time Calculation (min): 23 min  Charges:  $  Gait Training: 8-22 mins $Therapeutic Activity: 8-22 mins                      Sena Hitch 09/03/2013, 8:20 AM  Sarajane Marek, PTA [phone] 09/03/2013

## 2013-09-04 ENCOUNTER — Non-Acute Institutional Stay (SKILLED_NURSING_FACILITY): Payer: Medicare Other | Admitting: Internal Medicine

## 2013-09-04 DIAGNOSIS — I509 Heart failure, unspecified: Secondary | ICD-10-CM

## 2013-09-04 DIAGNOSIS — I5031 Acute diastolic (congestive) heart failure: Secondary | ICD-10-CM

## 2013-09-04 DIAGNOSIS — E039 Hypothyroidism, unspecified: Secondary | ICD-10-CM

## 2013-09-04 DIAGNOSIS — R51 Headache: Secondary | ICD-10-CM

## 2013-09-04 DIAGNOSIS — I441 Atrioventricular block, second degree: Secondary | ICD-10-CM

## 2013-09-04 DIAGNOSIS — F0280 Dementia in other diseases classified elsewhere without behavioral disturbance: Secondary | ICD-10-CM

## 2013-09-04 DIAGNOSIS — I498 Other specified cardiac arrhythmias: Secondary | ICD-10-CM

## 2013-09-04 DIAGNOSIS — G309 Alzheimer's disease, unspecified: Secondary | ICD-10-CM

## 2013-09-04 DIAGNOSIS — M19019 Primary osteoarthritis, unspecified shoulder: Secondary | ICD-10-CM

## 2013-09-04 DIAGNOSIS — M6281 Muscle weakness (generalized): Secondary | ICD-10-CM | POA: Diagnosis not present

## 2013-09-04 DIAGNOSIS — R269 Unspecified abnormalities of gait and mobility: Secondary | ICD-10-CM | POA: Diagnosis not present

## 2013-09-04 DIAGNOSIS — M75100 Unspecified rotator cuff tear or rupture of unspecified shoulder, not specified as traumatic: Secondary | ICD-10-CM

## 2013-09-04 DIAGNOSIS — I10 Essential (primary) hypertension: Secondary | ICD-10-CM | POA: Diagnosis not present

## 2013-09-04 DIAGNOSIS — Z5189 Encounter for other specified aftercare: Secondary | ICD-10-CM | POA: Diagnosis not present

## 2013-09-04 DIAGNOSIS — M542 Cervicalgia: Secondary | ICD-10-CM

## 2013-09-04 DIAGNOSIS — R55 Syncope and collapse: Secondary | ICD-10-CM | POA: Diagnosis not present

## 2013-09-04 DIAGNOSIS — N183 Chronic kidney disease, stage 3 unspecified: Secondary | ICD-10-CM

## 2013-09-04 DIAGNOSIS — R519 Headache, unspecified: Secondary | ICD-10-CM

## 2013-09-04 DIAGNOSIS — Z950 Presence of cardiac pacemaker: Secondary | ICD-10-CM

## 2013-09-04 DIAGNOSIS — M12819 Other specific arthropathies, not elsewhere classified, unspecified shoulder: Secondary | ICD-10-CM

## 2013-09-04 DIAGNOSIS — R2681 Unsteadiness on feet: Secondary | ICD-10-CM

## 2013-09-04 DIAGNOSIS — G4700 Insomnia, unspecified: Secondary | ICD-10-CM

## 2013-09-04 DIAGNOSIS — I129 Hypertensive chronic kidney disease with stage 1 through stage 4 chronic kidney disease, or unspecified chronic kidney disease: Secondary | ICD-10-CM | POA: Diagnosis not present

## 2013-09-04 DIAGNOSIS — N393 Stress incontinence (female) (male): Secondary | ICD-10-CM

## 2013-09-04 HISTORY — DX: Other specified cardiac arrhythmias: I49.8

## 2013-09-04 LAB — BASIC METABOLIC PANEL
BUN: 23 mg/dL (ref 6–23)
CALCIUM: 8.7 mg/dL (ref 8.4–10.5)
CO2: 33 mEq/L — ABNORMAL HIGH (ref 19–32)
Chloride: 92 mEq/L — ABNORMAL LOW (ref 96–112)
Creatinine, Ser: 1.02 mg/dL (ref 0.50–1.10)
GFR calc Af Amer: 54 mL/min — ABNORMAL LOW (ref >90)
GFR, EST NON AFRICAN AMERICAN: 46 mL/min — AB (ref >90)
GLUCOSE: 92 mg/dL (ref 70–99)
Potassium: 4.5 mEq/L (ref 3.7–5.3)
Sodium: 133 mEq/L — ABNORMAL LOW (ref 137–147)

## 2013-09-04 MED ORDER — COLCHICINE 0.6 MG PO TABS: 0.6000 mg | ORAL_TABLET | Freq: Two times a day (BID) | ORAL | Status: DC

## 2013-09-04 MED ORDER — FUROSEMIDE 10 MG/ML IJ SOLN: 40.0000 mg | Freq: Once | INTRAMUSCULAR | Status: DC

## 2013-09-04 MED ORDER — ALLOPURINOL 100 MG PO TABS: 100.0000 mg | ORAL_TABLET | Freq: Every day | ORAL | Status: DC

## 2013-09-04 MED ORDER — FUROSEMIDE 20 MG PO TABS: 20.0000 mg | ORAL_TABLET | Freq: Every day | ORAL | Status: DC

## 2013-09-04 NOTE — Assessment & Plan Note (Signed)
Continue sanctura

## 2013-09-04 NOTE — Assessment & Plan Note (Signed)
Continue levothyroxine 112mcg.

## 2013-09-04 NOTE — Assessment & Plan Note (Addendum)
Will increase trazodone to 50 mg and schedule it

## 2013-09-04 NOTE — Progress Notes (Signed)
Physical Therapy Treatment Patient Details Name: Jordan [name] MRN: [id number] DOB: 01/10/1920 Today's Date: 10/04/2013    History of Present Illness Pt s/p pacemaker insertion    PT Comments    Pt progressing with mobility.  UE pain limits her ability to perform transfers more independently at this time per pt's daughter.  Pt able to increase ambulation distance again this session.    Follow Up Recommendations  SNF     Equipment Recommendations  None recommended by PT    Recommendations for Other Services       Precautions / Restrictions Precautions Precautions: ICD/Pacemaker Precaution Comments: educated on no overhead ROM Required Braces or Orthoses: Sling (for comfort) Restrictions Weight Bearing Restrictions: No    Mobility  Bed Mobility               General bed mobility comments: pt sitting in recliner upon arrival  Transfers Overall transfer level: Needs assistance Equipment used: Rolling walker (2 wheeled) Transfers: Sit to/from Stand Sit to Stand: Mod assist         General transfer comment: painful UE's/shoulders limiting ability to push herself up to standing.    Ambulation/Gait Ambulation/Gait assistance: Min guard Ambulation Distance (Feet): 100 Feet (50' x 2) Assistive device: Rolling walker (2 wheeled) Gait Pattern/deviations: Step-through pattern;Decreased stride length;Decreased step length - right;Decreased step length - left;Shuffle Gait velocity: slow   General Gait Details: Pt slow but steady.     Stairs            Wheelchair Mobility    Modified Rankin (Stroke Patients Only)       Balance                                    Cognition Arousal/Alertness: Awake/alert Behavior During Therapy: WFL for tasks assessed/performed Overall Cognitive Status: Within Functional Limits for tasks assessed                      Exercises      General Comments        Pertinent Vitals/Pain Reports  UE/shoulder pain.  Daughter states pt has had tylenol    Home Living                      Prior Function            PT Goals (current goals can now be found in the care plan section) Acute Rehab PT Goals PT Goal Formulation: With patient Time For Goal Achievement: 09/14/13 Potential to Achieve Goals: Good Progress towards PT goals: Progressing toward goals    Frequency  Min 3X/week    PT Plan Current plan remains appropriate    Co-evaluation             End of Session Equipment Utilized During Treatment: Gait belt Activity Tolerance: Patient tolerated treatment well Patient left: in chair;with call bell/phone within reach;with family/visitor present     Time: 0922-0940 PT Time Calculation (min): 18 min  Charges:  $Gait Training: 8-22 mins                    G Codes:      Sena Hitch 10/04/2013, 1:34 PM   Sarajane Marek, PTA [phone] October 04, 2013

## 2013-09-04 NOTE — Assessment & Plan Note (Signed)
Continue clonidine, lasix and hydralazine

## 2013-09-04 NOTE — Assessment & Plan Note (Signed)
2/2 bradycardia; diuresed easily and now euvolemic

## 2013-09-04 NOTE — Discharge Instructions (Signed)

## 2013-09-04 NOTE — Discharge Summary (Signed)
Physician Discharge Summary  Patient ID: Jordan [name] MRN: [id number] DOB/AGE: 11-07-1919 80 y.o.  Admit date: 08/29/2013 Discharge date: 09/04/2013  Admission Diagnoses: Second Degree AV Block, Mobitz Type II  Discharge Diagnoses:  Principal Problem:   Second degree AV block, Mobitz type II Active Problems:   HTN (hypertension)   Alzheimer's dementia   Near syncope   Weakness   Mobitz type 2 second degree heart block   Acute diastolic CHF (congestive heart failure)   CKD (chronic kidney disease) stage 3, GFR 30-59 ml/min   Discharged Condition: stable  Hospital Course:  This is a 80 y.o. female, followed by Dr. Lovena Le,  with a past medical history significant for hypertension, osteoarthritis, COPD and syncope. She has been seen for numerous complaints over the past month by both her primary care provider and specialist. In October 2014, she sustained a syncopal episode and she subsequently had a loop recorder placed. She has had some persistent bradycardia but was not felt to be significantly bradycardic to necessitate a pacemaker. She was scheduled to have rotator cuff surgery on 08/29/13, however this was canceled due to a low heart rate. She apparently was presyncopal and had graying of her vision. When she presented the emergency room her heart rate was in the 30s. EKG demonstrates high degree 2:1 AV block. She required transcutaneous pacing. She was seen by EP and it was decided that she would need a PPM. On 08/30/13, she underwent successful implantation of a Medtronic Adapta L dual-chamber pacemaker.  Her previously implanted MDT LINQ implantable monitor was removed. She tolerated the procedure well. Post-operative CXR demonstrated no evidence of pneumothorax. Device interrogation revealed normal device functioning. Her wound remained stable.   It should also be noted that on arrival, she was also noted to be in acute on chronic diastolic CHF with evidence of volume overload on physical  exam and mild interstitial edema on CXR. She was treated with IV diuretic therapy and was transitioned back to PO Lasix, once a euvolemic state was reached.  In addition, she also complained of a painful, left swollen ankle. She has a PMH significant for septic arthritis. Thus, ID was consulted. An infectious etiology was ruled-out and an inflammatory process was suspected. CRP was elevated at 4.0 and uric acid level was elevated at 9.7. Gout was suspected and she was started on a trial of colchicine. She was also seen and evaluated by Orthopaedics, who also agreed that gout was the likely etiology. Her pain and swelling both improved with anti-inflammatory treatment.   [name] had no further issues. She was evaluated by PT and they recommended temporarily placement at a SNF for rehab, before final discharge home. On 09/04/13, she was last seen and examined by Dr. Tamala Julian, who determined she was stable for discharge to SNF. An appointment was arranged at [facility] for a wound check on 09/11/13. She will have post-hospital f/u with Richardson Dopp, PA-C, on 09/24/13. She was provided detailed instructions on proper wound care and activity limitations/ restrictions.  Consults: ID and orthopedics and electrophysiology  Significant Diagnostic Studies:  Treatments: See Hospital Course  Discharge Exam: Blood pressure 174/50, pulse 76, temperature 97.7 F (36.5 C), temperature source Oral, resp. rate 18, height 4\' 9"  (1.448 m), weight 136 lb 3.2 oz (61.78 kg), SpO2 100.00%.   Disposition: 01-Home or Self Care       Future Appointments Provider Department Dept Phone   09/11/2013 11:00 AM [facility] [employer] [phone]  09/24/2013 3:40 PM Liliane Shi, PA-C [facility] [phone]       Medication List    STOP taking these medications       diclofenac sodium 1 % Gel  Commonly known as:  VOLTAREN     hydrocortisone cream 1 %      ondansetron 4 MG tablet  Commonly known as:  ZOFRAN      TAKE these medications       acetaminophen 325 MG tablet  Commonly known as:  TYLENOL  Take 650 mg by mouth every 6 (six) hours as needed.     allopurinol 100 MG tablet  Commonly known as:  ZYLOPRIM  Take 1 tablet (100 mg total) by mouth daily.     aspirin 81 MG tablet  Take 81 mg by mouth daily.     CALTRATE 600+D 600-400 MG-UNIT per tablet  Generic drug:  Calcium Carbonate-Vitamin D  Take 1 tablet by mouth 2 (two) times daily.     cloNIDine 0.1 MG tablet  Commonly known as:  CATAPRES  Take 1 tablet (0.1 mg total) by mouth 2 (two) times daily.     colchicine 0.6 MG tablet  Commonly known as:  COLCRYS  Take 1 tablet (0.6 mg total) by mouth 2 (two) times daily.     docusate sodium 100 MG capsule  Commonly known as:  COLACE  Take 1 capsule (100 mg total) by mouth 2 (two) times daily.     furosemide 20 MG tablet  Commonly known as:  LASIX  Take 1 tablet (20 mg total) by mouth daily.     Glucosamine-Chondroitin-Vit D3 1500-1200-800 MG-MG-UNIT Pack  Take 1 tablet by mouth 2 (two) times daily.     levothyroxine 112 MCG tablet  Commonly known as:  SYNTHROID, LEVOTHROID  take 1 tablet by mouth once daily--  takes in am     lidocaine 5 %  Commonly known as:  LIDODERM  APPLY 1 PATCH TO SKIN DAILY. MAY CUT PATCH AND USE 1/4 OR 1/2 OF PATCH INSTEAD     polyethylene glycol packet  Commonly known as:  MIRALAX  Take 17 g by mouth daily.     RESTASIS 0.05 % ophthalmic emulsion  Generic drug:  cycloSPORINE  Place 1 drop into both eyes daily.     traMADol 50 MG tablet  Commonly known as:  ULTRAM  Take 0.5 tablets (25 mg total) by mouth every 12 (twelve) hours as needed.     TRAVATAN Z 0.004 % Soln ophthalmic solution  Generic drug:  Travoprost (BAK Free)  Place 1 drop into both eyes at bedtime.     traZODone 50 MG tablet  Commonly known as:  DESYREL  Take 0.5 tablets (25 mg total) by mouth at bedtime as needed  for sleep.     trospium 20 MG tablet  Commonly known as:  SANCTURA  Take 20 mg by mouth 2 (two) times daily.       Follow-up Information   Follow up On 09/11/2013. (11:00 am (for wound check at Dr. Tanna Furry office))    Contact information:   [address] [address] [phone]      Follow up with Richardson Dopp, PA-C On 09/24/2013. (3:40 pm (Cardiology follow-up))    Specialty:  Physician Assistant   Contact information:   [id number] [place] [address] [phone]      TIME SPENT ON DISCHARGE, INCLUDING PHYSICIAN TIME: >30 MINUTES  Signed: Lyda Jester 09/04/2013, 2:35 PM

## 2013-09-04 NOTE — Assessment & Plan Note (Signed)
GFR is 46 with BUN 23Ccr 1.02 with a high of 34/1.37 in hospital

## 2013-09-04 NOTE — Progress Notes (Signed)
Subjective: Denies pain. She did feel SOB last PM and required supplemental O2.   Objective: Vital signs in last 24 hours: Temp:  [97.9 F (36.6 C)-98.7 F (37.1 C)] 98.7 F (37.1 C) (05/06 0538) Pulse Rate:  [76-79] 79 (05/06 0538) Resp:  [18-19] 19 (05/05 2051) BP: (149-165)/(47-52) 151/48 mmHg (05/06 0538) SpO2:  [94 %-99 %] 94 % (05/06 0538) Weight:  [136 lb 3.2 oz (61.78 kg)] 136 lb 3.2 oz (61.78 kg) (05/06 0538) Last BM Date: 09/01/13  Intake/Output from previous day: 05/05 0701 - 05/06 0700 In: 500 [P.O.:500] Out: 720 [Urine:720] Intake/Output this shift:    Medications Current Facility-Administered Medications  Medication Dose Route Frequency Provider Last Rate Last Dose  . 0.9 %  sodium chloride infusion  250 mL Intravenous PRN [name], MD      . acetaminophen (TYLENOL) tablet 325-650 mg  325-650 mg Oral Q4H PRN [name], MD      . allopurinol (ZYLOPRIM) tablet 100 mg  100 mg Oral Daily Belva Crome III, MD   100 mg at 09/03/13 1042  . colchicine tablet 0.6 mg  0.6 mg Oral BID Erlene Quan, PA-C   0.6 mg at 09/03/13 2110  . cycloSPORINE (RESTASIS) 0.05 % ophthalmic emulsion 1 drop  1 drop Both Eyes Daily Erlene Quan, PA-C   1 drop at 09/04/13 0844  . darifenacin (ENABLEX) 24 hr tablet 7.5 mg  7.5 mg Oral Daily Erlene Quan, PA-C   7.5 mg at 09/03/13 1042  . diclofenac sodium (VOLTAREN) 1 % transdermal gel 2 g  2 g Topical QID Erlene Quan, PA-C   2 g at 09/04/13 0847  . docusate sodium (COLACE) capsule 100 mg  100 mg Oral BID Erlene Quan, PA-C   100 mg at 09/03/13 2110  . furosemide (LASIX) tablet 20 mg  20 mg Oral Daily Belva Crome III, MD   20 mg at 09/04/13 0845  . hydrALAZINE (APRESOLINE) tablet 25 mg  25 mg Oral 3 times per day Erlene Quan, PA-C   25 mg at 09/04/13 0845  . latanoprost (XALATAN) 0.005 % ophthalmic solution 1 drop  1 drop Both Eyes QHS Erlene Quan, PA-C   1 drop at 09/03/13 2111  . levothyroxine (SYNTHROID, LEVOTHROID)  tablet 125 mcg  125 mcg Oral QAC breakfast [name], MD   125 mcg at 09/04/13 0844  . nitroGLYCERIN (NITROSTAT) SL tablet 0.4 mg  0.4 mg Sublingual Q5 Min x 3 PRN Doreene Burke Kilroy, PA-C      . ondansetron [facility]) injection 4 mg  4 mg Intravenous Q6H PRN [name], MD   4 mg at 09/03/13 2336  . oxyCODONE-acetaminophen (PERCOCET/ROXICET) 5-325 MG per tablet 1 tablet  1 tablet Oral Q6H PRN [name], MD   1 tablet at 09/03/13 2045  . polyethylene glycol (MIRALAX / GLYCOLAX) packet 17 g  17 g Oral Daily Erlene Quan, PA-C   17 g at 09/02/13 1010  . sodium chloride 0.9 % injection 3 mL  3 mL Intravenous Q12H [name], MD   3 mL at 09/04/13 0847  . sodium chloride 0.9 % injection 3 mL  3 mL Intravenous PRN [name], MD      . traMADol Veatrice Bourbon) tablet 25 mg  25 mg Oral Q12H PRN Erlene Quan, PA-C   25 mg at 09/04/13 [id number]  . traZODone (DESYREL) tablet 25 mg  25 mg Oral QHS PRN Erlene Quan, PA-C  25 mg at 09/03/13 2141    PE: General appearance: alert, cooperative and no distress Neck: no carotid bruit and mild JVD Lungs: bibasilar crakles that do no clear after coughing Heart: irregularly irregular rhythm Extremities: no LEE Pulses: 2+ and symmetric Skin: warm and dry Neurologic: Grossly normal  Lab Results:   Recent Labs  09/03/13 0748  WBC 8.5  HGB 10.6*  HCT 32.6*  PLT 158   BMET  Recent Labs  09/02/13 0347 09/03/13 0748 09/04/13 0552  NA 137 135* 133*  K 4.1 4.2 4.5  CL 95* 93* 92*  CO2 33* 32 33*  GLUCOSE 93 96 92  BUN 27* 24* 23  CREATININE 1.23* 1.05 1.02  CALCIUM 8.6 8.6 8.7     Assessment/Plan  Principal Problem:   Second degree AV block, Mobitz type II Active Problems:   HTN (hypertension)   Alzheimer's dementia   Near syncope   Weakness   Mobitz type 2 second degree heart block   Acute diastolic CHF (congestive heart failure)   CKD (chronic kidney disease) stage 3, GFR 30-59 ml/min  1. Heart Block - s/p PPM 08/30/13. Paced rhythm on  telemetry.   2. Acute Diastolic HF - still with bibasilar rales on exam that do no clear with deep cough, also with slight JVD. She complained of dyspnea last PM and required supplemental O2. She is on 20 mg PO Lasix. ? Increasing PO dose to 40 mg vs giving another dose of IV Lasix.   3. A/C Kidney disease - Scr has normalized. 1.02 today  4. Inflammatory arthritis -  Left ankle. Felt to be gout. Improved with anti- inflammatory therapy   Dispo: SNF when medically ready   LOS: 6 days    Brittainy M. Rosita Fire, PA-C 09/04/2013 9:25 AM

## 2013-09-04 NOTE — Progress Notes (Signed)
Report called to Alden Server, RN at [facility]. In depth discussion regarding patient's follow up care/appts/wound care/medicines/PMH/etc. Time allowed for questions and concerns. D/C information/instructions also discussed with patient's family at the bedside to facilitate a smooth transition of care. Teaching included information regarding signs and symptoms of heart failure, activity limitations in regards to pace maker, and wound care for pacemaker site. Time allowed for questions and concerns. IV removed, telemetry discontinued. PTAR transportation pending this evening. Report will be given to on-coming RN for continuation of care.  Soyla Dryer, RN

## 2013-09-04 NOTE — Progress Notes (Signed)
MRN: [id number] Name: Jordan [name]  Sex: female Age: 80 y.o. DOB: 13-May-1919  [place] #: [facility] Facility/Room: Level Of Care: SNF Provider: Hennie Duos Emergency Contacts: Extended Emergency Contact Information Primary Emergency Contact: Ruales,[name] Address: [address]          [place] Phone: [phone] Work Phone: [phone] Mobile Phone: [phone] Relation: Daughter Secondary Emergency Contact: Jackelyn Hoehn [place] Phone: [phone] Mobile Phone: [phone] Relation: Son  Code Status: FULL  Allergies: Amlodipine; Chlorthalidone; Verapamil; Adhesive; and Honey bee treatment  Chief Complaint  Patient presents with  . nursing home admission    HPI: Patient is 80 y.o. female who presented with near syncope with a HR in the 30's and acute on chronic CHF, who is s/p pacemaker placement and admitted to SNF for OT/PT for generalized weakness.  Past Medical History  Diagnosis Date  . Cervicalgia   . Peripheral neuropathy   . Hypertension   . Osteoporosis   . Insomnia   . Arthritis   . Headache   . Hypothyroidism   . Right rotator cuff tear   . OA (osteoarthritis)     RIGHT SHOULDER AC JOINT  . History of syncope     01/2013  DX  CYRPTOGENIC STROKE  . Moderate dementia   . Gait instability   . First degree heart block   . History of colon polyps   . Diverticulosis of colon   . SUI (stress urinary incontinence, female)   . Lumbar stenosis   . Status post placement of implantable loop recorder     02-08-2013  . Cryptogenic stroke S/P  LOOP RECORDER IMPLANT    DX  01/2013  --  SYNCOPAL EPISODE --  EPISODIC  ATRIAL TACHY/ FIBULATION  AND PAUSES  . Frequency of urination   . Short of breath on exertion   . Chronic constipation   . Glaucoma of both eyes   . Hearing loss of both ears     no hearing aids  . Dry eyes   . Pneumonia     Past Surgical History  Procedure Laterality Date   . Loop recorder implant  02-08-13; 08-30-13    MDT LinQ implanted by Dr Lovena Le for syncope, explanted 08-30-13 after CHB identified  . Total abdominal hysterectomy w/ bilateral salpingoophorectomy  03-15-2005  . Carpal tunnel release Right   . Eye surgery      cataract, bilat.  . Pacemaker insertion  08-30-2013    MDT ADDRL1 pacemaker implanted by Dr Rayann Heman for CHB      Medication List    Notice   This visit is during an admission. Changes to the med list made in this visit will be reflected in the After Visit Summary of the admission.     Current Outpatient Prescriptions on File Prior to Visit  Medication Sig Dispense Refill  . acetaminophen (TYLENOL) 325 MG tablet Take 650 mg by mouth every 6 (six) hours as needed.      Marland Kitchen allopurinol (ZYLOPRIM) 100 MG tablet Take 1 tablet (100 mg total) by mouth daily.  30 tablet  12  . aspirin 81 MG tablet Take 81 mg by mouth daily.      . Calcium Carbonate-Vitamin D (CALTRATE 600+D) 600-400 MG-UNIT per tablet Take 1 tablet by mouth 2 (two) times daily.        . cloNIDine (CATAPRES) 0.1 MG tablet Take 1 tablet (0.1 mg total) by mouth 2 (two) times  daily.  60 tablet  1  . colchicine (COLCRYS) 0.6 MG tablet Take 1 tablet (0.6 mg total) by mouth 2 (two) times daily.  60 tablet  3  . docusate sodium (COLACE) 100 MG capsule Take 1 capsule (100 mg total) by mouth 2 (two) times daily.  60 capsule  0  . furosemide (LASIX) 20 MG tablet Take 1 tablet (20 mg total) by mouth daily.  30 tablet  12  . Glucosamine-Chondroitin-Vit D3 1500-1200-800 MG-MG-UNIT PACK Take 1 tablet by mouth 2 (two) times daily.        Marland Kitchen levothyroxine (SYNTHROID, LEVOTHROID) 112 MCG tablet take 1 tablet by mouth once daily--  takes in am      . lidocaine (LIDODERM) 5 % APPLY 1 PATCH TO SKIN DAILY. MAY CUT PATCH AND USE 1/4 OR 1/2 OF PATCH INSTEAD  30 patch  0  . polyethylene glycol (MIRALAX) packet Take 17 g by mouth daily.  14 each  0  . RESTASIS 0.05 % ophthalmic emulsion Place 1 drop into  both eyes daily.       . traMADol (ULTRAM) 50 MG tablet Take 0.5 tablets (25 mg total) by mouth every 12 (twelve) hours as needed.  30 tablet  2  . TRAVATAN Z 0.004 % SOLN ophthalmic solution Place 1 drop into both eyes at bedtime.       . traZODone (DESYREL) 50 MG tablet Take 0.5 tablets (25 mg total) by mouth at bedtime as needed for sleep.  30 tablet  3  . trospium (SANCTURA) 20 MG tablet Take 20 mg by mouth 2 (two) times daily.        No current facility-administered medications on file prior to visit.     No orders of the defined types were placed in this encounter.    Immunization History  Administered Date(s) Administered  . Influenza Split 01/24/2012  . Influenza Whole 03/05/2007, 02/22/2010  . Influenza,inj,Quad PF,36+ Mos 02/10/2013  . Pneumococcal Polysaccharide-23 01/30/1997  . Td 03/03/1999, 05/18/2009    History  Substance Use Topics  . Smoking status: Never Smoker   . Smokeless tobacco: Never Used  . Alcohol Use: No    Family history is noncontributory    Review of Systems  DATA OBTAINED:  nurse, medical record, pt's daughter- pt speaks only spanish GENERAL - TIRED, WANTS TO REST  SKIN: No itching, rash or wounds EYES: No eye pain, redness, discharge EARS: No earache, tinnitus, change in hearing NOSE: No congestion, drainage or bleeding  MOUTH/THROAT: No mouth or tooth pain, No sore throat, No difficulty chewing or swallowing  RESPIRATORY: No cough, wheezing, SOB CARDIAC: No chest pain, palpitations, lower extremity edema  GI: No abdominal pain, No N/V/D or constipation, No heartburn or reflux  GU: No dysuria, frequency or urgency, or incontinence  MUSCULOSKELETAL: c/o some neck popping and tightness; needs ultram scheduled NEUROLOGIC: + headache,for last 3 weeks; no dizziness or focal weakness PSYCHIATRIC: Did not sleep well, needs trazodone scheduled   Filed Vitals:   09/06/13 1135  BP: 155/65  Pulse: 62  Temp: 97 F (36.1 C)  Resp: 18     Physical Exam  GENERAL APPEARANCE: Alert, conversant. Appropriately groomed. No acute distress.  SKIN: No diaphoresis rash, or wounds HEAD: Normocephalic, atraumatic  EYES: Conjunctiva/lids clear. Pupils round, reactive. EOMs intact.  EARS: External exam WNL, canals clear. Hearing grossly normal.  NOSE: No deformity or discharge.  MOUTH/THROAT: Lips w/o lesions. Mouth and throat normal. Tongue moist, w/o lesion.  NECK: bilateral muscle tightness  RESPIRATORY: Breathing is even, unlabored. Lung sounds are clear except miniumal rale at base   CARDIOVASCULAR: Heart RRR no murmurs, rubs or gallops. No peripheral edema.  GASTROINTESTINAL: Abdomen is soft, non-tender, not distended w/ normal bowel sounds. GENITOURINARY: Bladder non tender, not distended  MUSCULOSKELETAL: No abnormal joints or musculature NEUROLOGIC: Oriented X3. Cranial nerves 2-12 grossly intact. Moves all extremities no tremor. PSYCHIATRIC: Mood and affect appropriate to situation, no behavioral issues  Patient Active Problem List   Diagnosis Date Noted  . Persistent headaches 09/06/2013  . 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 CKD (chronic kidney disease) stage 3, GFR 30-59 ml/min 09/03/2013  . Acute diastolic CHF (congestive heart failure) 09/01/2013  . Second degree heart block 08/29/2013  . Near syncope 08/29/2013  . Weakness 08/29/2013  . Second degree AV block, Mobitz type II 08/29/2013  . Mobitz type 2 second degree heart block 08/29/2013  . Hyponatremia 08/23/2013  . Constipation 08/23/2013  . Dermatitis 08/23/2013  . Headache(784.0) 08/18/2013  . Alzheimer's dementia 07/30/2013  . Nausea alone 03/22/2013  . Syncope and collapse 02/09/2013  . Spinal stenosis of lumbar region 10/03/2012  . Rotator cuff tear arthropathy 09/11/2012  . Gait instability 08/28/2012  . Xerostomia 01/24/2012  . Fine tremor 12/19/2011  . Dry skin dermatitis 12/19/2011  .  Fatigue 10/11/2011  . Insomnia 06/14/2010  . LOSS OF WEIGHT 04/05/2010  . MASS, LUNG 04/05/2010  . PERIPHERAL NEUROPATHY 03/29/2010  . INTERSTITIAL CYSTITIS 09/21/2009  . Cervicalgia 05/18/2009  . ACCIDENTAL FALL, HX OF 12/15/2008  . OSTEOPOROSIS 03/20/2008  . GALLSTONES 12/27/2006  . HERNIA, INCISIONAL VENTRAL W/O OBST/GNGR 10/25/2006  . HYPOTHYROIDISM, UNSPECIFIED 06/29/2006  . DEPRESSIVE DISORDER, NOS 06/29/2006  . CARPAL TUNNEL SYNDROME 06/29/2006  . HTN (hypertension) 06/29/2006  . DIVERTICULOSIS OF COLON 06/29/2006  . CYSTOCELE/RECTOCELE/PROLAPSE,UNSPEC. 06/29/2006  . INCONTINENCE, STRESS, FEMALE 06/29/2006  . OSTEOARTHRITIS, MULTI SITES 06/29/2006  . INCONTINENCE, URGE 06/29/2006    CBC    Component Value Date/Time   WBC 8.5 09/03/2013 0748   RBC 3.36* 09/03/2013 0748   HGB 10.6* 09/03/2013 0748   HCT 32.6* 09/03/2013 0748   PLT 158 09/03/2013 0748   MCV 97.0 09/03/2013 0748   LYMPHSABS 1.5 08/29/2013 1826   MONOABS 1.2* 08/29/2013 1826   EOSABS 0.0 08/29/2013 1826   BASOSABS 0.0 08/29/2013 1826    CMP     Component Value Date/Time   NA 133* 09/04/2013 0552   K 4.5 09/04/2013 0552   CL 92* 09/04/2013 0552   CO2 33* 09/04/2013 0552   GLUCOSE 92 09/04/2013 0552   BUN 23 09/04/2013 0552   CREATININE 1.02 09/04/2013 0552   CREATININE 1.20* 08/22/2013 1613   CALCIUM 8.7 09/04/2013 0552   PROT 6.0 08/30/2013 0504   ALBUMIN 2.7* 08/30/2013 0504   AST 43* 08/30/2013 0504   ALT 93* 08/30/2013 0504   ALKPHOS 101 08/30/2013 0504   BILITOT 0.4 08/30/2013 0504   GFRNONAA 46* 09/04/2013 0552   GFRAA 54* 09/04/2013 0552    Assessment and Plan  Mobitz type 2 second degree heart block Presentation was near syncope and HR in 30's. . Pt had placed a medtronic Adapta L dual chamber pacemaker  Acute diastolic CHF (congestive heart failure) 2/2 bradycardia; diuresed easily and now euvolemic  HTN (hypertension) Continue clonidine, lasix and hydralazine  HYPOTHYROIDISM, UNSPECIFIED Continue levothyroxine 112  mcg  Alzheimer's dementia On no dementia specific meds  CKD (chronic kidney disease) stage 3, GFR 30-59 ml/min GFR is 46  with BUN 23Ccr 1.02 with a high of 34/1.37 in hospital  INCONTINENCE, STRESS, FEMALE Continue sanctura  Gait instability PT/OT at SNF  Insomnia Will increase trazodone to 50 mg and schedule it  Cervicalgia Apparently pt has had neck pain prior. I  Think her headaches may be tension. Apparently muscle relaxers have been discussed with PCP. While pt is at SNF in protected environment will try Flexeril 5 mg qHS and see if it helps.  Persistent headaches See above;very likely related to neck. Will start flexeril 5 mg qHS and order outpt neurology visit.  Rotator cuff tear arthropathy R shoulder. Pt to have surg for this then bradycardia developed; will inc Ultram to 50 mg q 12 scheduled    Hennie Duos, MD

## 2013-09-04 NOTE — Assessment & Plan Note (Signed)
PT/OT at SNF °

## 2013-09-04 NOTE — Progress Notes (Signed)
Patient's daughter wanted to swtich facilities and go to [place]. CSW explained that the patient had been discharged and would need to go to a facility today. Patient's daughter reported that she would call CSW back with her answer. CSW was called by the Liaison from [facility] and told that the patient would not be able to admit until tomorrow. CSW stressed to patient's daughter that she would need to be discharged today. Patient's daughter called back and reported that she planned to accept the bed at [facility].  Clinical social worker assisted with patient discharge to skilled nursing facility, [facility].  CSW addressed all family questions and concerns. CSW copied chart and added all important documents. CSW also set up patient transportation with Diplomatic Services operational officer. Clinical Social Worker will sign off for now as social work intervention is no longer needed.   Rhea Pink, MSW, [place]

## 2013-09-04 NOTE — Assessment & Plan Note (Signed)
Presentation was near syncope and HR in 30's. . Pt had placed a medtronic Adapta L dual chamber pacemaker

## 2013-09-04 NOTE — Progress Notes (Addendum)
She is looking better and better daily. Getting mildly hyponatremic on Lasix. This needs to be followed. She is ready for discharge. Will need BMET in 1week.

## 2013-09-04 NOTE — Assessment & Plan Note (Signed)
On no dementia specific meds

## 2013-09-05 ENCOUNTER — Encounter: Payer: Self-pay | Admitting: Internal Medicine

## 2013-09-05 LAB — CULTURE, BLOOD (ROUTINE X 2)
Culture: NO GROWTH
Culture: NO GROWTH

## 2013-09-05 NOTE — Discharge Summary (Signed)
The above note is reviewed. It is accurate and I am in agreement with the contents. She is looking better and better daily. Getting mildly hyponatremic on Lasix. This needs to be followed. She is ready for discharge. Will need BMET in 1week.

## 2013-09-06 ENCOUNTER — Encounter: Payer: Self-pay | Admitting: Internal Medicine

## 2013-09-06 DIAGNOSIS — R519 Headache, unspecified: Secondary | ICD-10-CM

## 2013-09-06 DIAGNOSIS — R51 Headache: Secondary | ICD-10-CM

## 2013-09-06 HISTORY — DX: Headache, unspecified: R51.9

## 2013-09-06 NOTE — Assessment & Plan Note (Signed)
Apparently pt has had neck pain prior. I  Think her headaches may be tension. Apparently muscle relaxers have been discussed with PCP. While pt is at SNF in protected environment will try Flexeril 5 mg qHS and see if it helps.

## 2013-09-06 NOTE — Assessment & Plan Note (Signed)
See above;very likely related to neck. Will start flexeril 5 mg qHS and order outpt neurology visit.

## 2013-09-06 NOTE — Assessment & Plan Note (Signed)
R shoulder. Pt to have surg for this then bradycardia developed; will inc Ultram to 50 mg q 12 scheduled

## 2013-09-09 ENCOUNTER — Telehealth: Payer: Self-pay | Admitting: Family Medicine

## 2013-09-09 ENCOUNTER — Encounter: Payer: Self-pay | Admitting: Internal Medicine

## 2013-09-09 DIAGNOSIS — I441 Atrioventricular block, second degree: Secondary | ICD-10-CM

## 2013-09-09 DIAGNOSIS — R52 Pain, unspecified: Secondary | ICD-10-CM

## 2013-09-09 DIAGNOSIS — R2681 Unsteadiness on feet: Secondary | ICD-10-CM

## 2013-09-09 DIAGNOSIS — I5031 Acute diastolic (congestive) heart failure: Secondary | ICD-10-CM

## 2013-09-09 DIAGNOSIS — E039 Hypothyroidism, unspecified: Secondary | ICD-10-CM

## 2013-09-09 DIAGNOSIS — G4700 Insomnia, unspecified: Secondary | ICD-10-CM

## 2013-09-09 DIAGNOSIS — I10 Essential (primary) hypertension: Secondary | ICD-10-CM

## 2013-09-09 NOTE — Telephone Encounter (Signed)
Pt's daughter made an  appt for pt on Thursday. She wants to know if Dr Josephina Gip could please see her mother today?

## 2013-09-09 NOTE — Telephone Encounter (Signed)
For [place]: pt didn't have rotator cuff surgery but instead got a pacemaker. Would like to talk to dr about blood pressure monitor and if it is needed now. For [place]: when pt was in hospital for pacemaker, her thyroid and creatine levels were off. She was advised to check with Dr.

## 2013-09-09 NOTE — Progress Notes (Signed)
Patient ID: Jordan [name], female   DOB: [birth year], 80 y.o.   MRN: [id number]   this is a discharge note.  Level of care skilled.  Facility AF   HPI: Patient is 80 y.o. female who presented with near syncope with a HR in the 30's and acute on chronic CHF, who is s/p pacemaker placement and admitted to SNF for OT/PT for generalized weakness.--vital signs appear to be stable has been afebrile it appears pulses run largely in the 60s/70s-blood pressures are somewhat variable most recently 110/59 123/78 136/68 142/60  Family feels patient would do better at home and she is slated for discharge on May 15--patient apparently is drinking her fluids well has somewhat of a spotty appetite her family is bringing in food she is a bit more familiar with -- they are very supportive -- trying to feed her low salt food from home-they are at bedside this evening-and will be with her at home-  She is ambulating with a walker but still has some weakness and would benefit certainly from continued therapy as well as home health support for her continuing medical issues  Had complained of pain to Dr. Sheppard Coil when she was evaluated last week-apparently this is somewhat improved her Ultram was increased to 50 mg twice a day-she also had insomnia which apparently is a bit better trazodone was increased to 50 mg each bedtime      Past Medical History   Diagnosis  Date   .  Cervicalgia     .  Peripheral neuropathy     .  Hypertension     .  Osteoporosis     .  Insomnia     .  Arthritis     .  Headache     .  Hypothyroidism     .  Right rotator cuff tear     .  OA (osteoarthritis)         RIGHT SHOULDER AC JOINT   .  History of syncope         01/2013  DX  CYRPTOGENIC STROKE   .  Moderate dementia     .  Gait instability     .  First degree heart block     .  History of colon polyps     .  Diverticulosis of colon     .  SUI (stress urinary incontinence, female)     .  Lumbar stenosis     .  Status post  placement of implantable loop recorder         02-08-2013   .  Cryptogenic stroke  S/P  LOOP RECORDER IMPLANT       DX  01/2013  --  SYNCOPAL EPISODE --  EPISODIC  ATRIAL TACHY/ FIBULATION  AND PAUSES   .  Frequency of urination     .  Short of breath on exertion     .  Chronic constipation     .  Glaucoma of both eyes     .  Hearing loss of both ears         no hearing aids   .  Dry eyes     .  Pneumonia           Past Surgical History   Procedure  Laterality  Date   .  Loop recorder implant    02-08-13; 08-30-13       MDT LinQ implanted by Dr Lovena Le for syncope, explanted 08-30-13 after CHB  identified   .  Total abdominal hysterectomy w/ bilateral salpingoophorectomy    03-15-2005   .  Carpal tunnel release  Right     .  Eye surgery           cataract, bilat.   .  Pacemaker insertion    08-30-2013       MDT ADDRL1 pacemaker implanted by Dr Rayann Heman for CHB      .          Current Outpatient Prescriptions on File Prior to Visit   Medication  Sig  Dispense  Refill   .  acetaminophen (TYLENOL) 325 MG tablet  Take 650 mg by mouth every 6 (six) hours as needed.         Marland Kitchen  allopurinol (ZYLOPRIM) 100 MG tablet  Take 1 tablet (100 mg total) by mouth daily.   30 tablet   12   .  aspirin 81 MG tablet  Take 81 mg by mouth daily.         .  Calcium Carbonate-Vitamin D (CALTRATE 600+D) 600-400 MG-UNIT per tablet  Take 1 tablet by mouth 2 (two) times daily.           .  cloNIDine (CATAPRES) 0.1 MG tablet  Take 1 tablet (0.1 mg total) by mouth 2 (two) times daily.   60 tablet   1   .  colchicine (COLCRYS) 0.6 MG tablet  Take 1 tablet (0.6 mg total) by mouth 2 (two) times daily.   60 tablet   3   .  docusate sodium (COLACE) 100 MG capsule  Take 1 capsule (100 mg total) by mouth 2 (two) times daily.   60 capsule   0   .  furosemide (LASIX) 20 MG tablet  Take 1 tablet (20 mg total) by mouth daily.   30 tablet   12    .           .  levothyroxine (SYNTHROID, LEVOTHROID) 112 MCG tablet  take 1  tablet by mouth once daily--  takes in am         .  lidocaine (LIDODERM) 5 %  APPLY 1 PATCH TO SKIN DAILY. MAY CUT PATCH AND USE 1/4 OR 1/2 OF PATCH INSTEAD   30 patch   0   .  polyethylene glycol (MIRALAX) packet  Take 17 g by mouth daily.   14 each   0   .  RESTASIS 0.05 % ophthalmic emulsion  Place 1 drop into both eyes daily.          .  traMADol (ULTRAM) 50 MG tablet  Take one tab  by mouth every 12 (twelve)  .  TRAVATAN Z 0.004 % SOLN ophthalmic solution  Place 1 drop into both eyes at bedtime.          .  traZODone (DESYREL) 50 MG tablet  Take one tablet my mouth   .  trospium (SANCTURA) 20 MG tablet  Take 20 mg by mouth 2 (two) times daily.                History   Substance Use Topics   .  Smoking status:  Never Smoker    .  Smokeless tobacco:  Never Used   .  Alcohol Use:  No        Family history is noncontributory     Review of Systems   DATA OBTAINED:  nurse, medical record, pt's son- pt speaks only spanish GENERAL -  no acute complaints although she still appears to be weak   SKIN: No itching, rash or wounds EYES: No eye pain, redness, discharge EARS: No earache, tinnitus, change in hearing NOSE: No congestion, drainage or bleeding   MOUTH/THROAT: No mouth or tooth pain, No sore throat, No difficulty chewing or swallowing   RESPIRATORY: No cough, wheezing, SOB CARDIAC: No chest pain, palpitations, lower extremity ede ma   GI: No abdominal pain, No N/V/D or constipation, No heartburn or reflux   GU: No dysuria, frequency or urgency, or incontinence   MUSCULOSKELETAL: Still has some neck discomfort but says this is better since tramadol with increased-also apparently had a gout flare in hospital that has resolved NEUROLOGIC: Not complaining of headache this evening no dizziness or focal weakness PSYCHIATRIC: Apparently is sleeping a bit better with trazodone scheduled               Physical Exam  Temperature 97.1 pulse 76 respirations 16 blood pressure  110/59   GENERAL APPEARANCE: Alert, conversant. Appropriately groomed. No acute distress.   SKIN: No diaphoresis rash, or wounds HEAD: Normocephalic, atraumatic   EYES: Conjunctiva/lids clear. Pupils round, reactive. EOMs intact.   EARS: Hearing grossly normal.   NOSE: No deformity or discharge.   MOUTH/THROAT: Lips w/o lesions. Mouth and throat normal. Tongue moist, w/o lesion.   NECK: Per family appears to have less tenderness to palpation when I evaluated her this evening still a bit stiffness but apparently improved  RESPIRATORY: Breathing is even, unlabored. Lung sounds are clear except miniumal rale at base--this sounds baseline with previous exam by Dr. Sheppard Coil   CARDIOVASCULAR: Heart RRR no murmurs, rubs or gallops. No peripheral edema.   GASTROINTESTINAL: Abdomen is soft, non-tender, not distended w/ normal bowel sounds. GENITOURINARY: Bladder non tender, not distended   MUSCULOSKELETAL: No abnormal joints or musculature--no edema noted of left foot still a little bit of residual tenderness but much improved apparently from hospital She is able to stand without assistance although somewhat weak is able to ambulate with a rolling walker although appears to be a bit unsteady NEUROLOGIC: Oriented X3. Cranial nerves 2-12 grossly intact. Moves all extremities no tremor. PSYCHIATRIC: Mood and affect appropriate to situation, no behavioral issues    Patient Active Problem List     Diagnosis  Date Noted   .  Persistent headaches  09/06/2013   .  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  CKD (chronic kidney disease) stage 3, GFR 30-59 ml/min  09/03/2013   .  Acute diastolic CHF (congestive heart failure)  09/01/2013   .  Second degree heart block  08/29/2013   .  Near syncope  08/29/2013   .  Weakness  08/29/2013   .  Second degree AV block, Mobitz type II  08/29/2013   .  Mobitz type 2 second degree heart block  08/29/2013   .   Hyponatremia  08/23/2013   .  Constipation  08/23/2013   .  Dermatitis  08/23/2013   .  Headache(784.0)  08/18/2013   .  Alzheimer's dementia  07/30/2013   .  Nausea alone  03/22/2013   .  Syncope and collapse  02/09/2013   .  Spinal stenosis of lumbar region  10/03/2012   .  Rotator cuff tear arthropathy  09/11/2012   .  Gait instability  08/28/2012   .  Xerostomia  01/24/2012   .  Fine tremor  12/19/2011   .  Dry skin dermatitis  12/19/2011   .  Fatigue  10/11/2011   .  Insomnia  06/14/2010   .  LOSS OF WEIGHT  04/05/2010   .  MASS, LUNG  04/05/2010   .  PERIPHERAL NEUROPATHY  03/29/2010   .  INTERSTITIAL CYSTITIS  09/21/2009   .  Cervicalgia  05/18/2009   .  ACCIDENTAL FALL, HX OF  12/15/2008   .  OSTEOPOROSIS  03/20/2008   .  GALLSTONES  12/27/2006   .  HERNIA, INCISIONAL VENTRAL W/O OBST/GNGR  10/25/2006   .  HYPOTHYROIDISM, UNSPECIFIED  06/29/2006   .  DEPRESSIVE DISORDER, NOS  06/29/2006   .  CARPAL TUNNEL SYNDROME  06/29/2006   .  HTN (hypertension)  06/29/2006   .  DIVERTICULOSIS OF COLON  06/29/2006   .  CYSTOCELE/RECTOCELE/PROLAPSE,UNSPEC.  06/29/2006   .  INCONTINENCE, STRESS, FEMALE  06/29/2006   .  OSTEOARTHRITIS, MULTI SITES  06/29/2006   .  INCONTINENCE, URGE  06/29/2006        Labs.    Date/Time     WBC  8.5  09/03/2013 0748     RBC  3.36*  09/03/2013 0748     HGB  10.6*  09/03/2013 0748     HCT  32.6*  09/03/2013 0748     PLT  158  09/03/2013 0748     MCV  97.0  09/03/2013 0748     LYMPHSABS  1.5  08/29/2013 1826     MONOABS  1.2*  08/29/2013 1826     EOSABS  0.0  08/29/2013 1826     BASOSABS  0.0  08/29/2013 1826        CMP         Component  Value  Date/Time     NA  133*  09/04/2013 0552     K  4.5  09/04/2013 0552     CL  92*  09/04/2013 0552     CO2  33*  09/04/2013 0552     GLUCOSE  92  09/04/2013 0552     BUN  23  09/04/2013 0552     CREATININE  1.02  09/04/2013 0552     CREATININE  1.20*  08/22/2013 1613     CALCIUM  8.7  09/04/2013 0552     PROT  6.0   08/30/2013 0504     ALBUMIN  2.7*  08/30/2013 0504     AST  43*  08/30/2013 0504     ALT  93*  08/30/2013 0504     ALKPHOS  101  08/30/2013 0504     BILITOT  0.4  08/30/2013 0504     GFRNONAA  46*  09/04/2013 0552     GFRAA  54*  09/04/2013 0552        Assessment and Plan   Mobitz type 2 second degree heart block Presentation was near syncope and HR in 30's. . Pt had placed a medtronic Adapta L dual chamber pacemaker--does appear to be relatively stable largely and 60-70s range --she has cardiology followup scheduled   Acute diastolic CHF (congestive heart failure) 2/2 bradycardia; diuresed easily and now euvolemic--no evidence of edema--respiratory status stable does not complaining of shortness of breath   HTN (hypertension) on clonidine, lasix --this appears relatively stable    HYPOTHYROIDISM, UNSPECIFIED Continue levothyroxine 112 mcg--- update TSH   Alzheimer's dementia On no dementia specific meds   CKD (chronic kidney disease) stage 3, GFR 30-59 ml/min GFR is 46 with BUN  23Ccr 1.02 with a high of 34/1.37 in hospital--will need to update before discharge    INCONTINENCE, STRESS, FEMALE Continue sanctura   Gait instability  is working with therapy but certainly would benefit from extended course of PT and OT at home   Insomnia Trazodone has just been increased last week- this has helped   Cervicalgia Apparently pt has had neck pain prior.--Currently on a limited course of Flexeril--this appears to be improved .   Persistent headaches  on a trial of Flexeril also neurology consult has been ordered--does not complaining of headaches this evening.   Rotator cuff tear arthropathy R shoulder. Pt to have surg for this then bradycardia developed; -Ultram h as been increased to 50 mg    t.              R shoulder. Pt to have surg for this then bradycardia developed; --again, the Ultram has been increased    Patient will need updated CBC and metabolic panel  before discharge-she continues to be quite weak-   also will need home health nursing for her medical issues including the cardiac issues and weakness-as well as a CNA for support with ADLs and PT and OT for further strengthening  [id number]-of note greater than 30 minutes spent on this discharge summary                This encounter was created in error - please disregard.

## 2013-09-09 NOTE — Telephone Encounter (Signed)
Spoke with [facility] and patient is at [facility] and [facility].  She is unable to seen by any MD outside of the facility.  Also she can't get any care at home if she is unvoluntarily discharged from facility.  Daughter is staying every day with her since she doesn't speak english and patient is "scared".  She would like any help she can to get her mom home.   Khali Albanese,CMA

## 2013-09-11 ENCOUNTER — Ambulatory Visit: Payer: Medicare Other

## 2013-09-11 ENCOUNTER — Ambulatory Visit (INDEPENDENT_AMBULATORY_CARE_PROVIDER_SITE_OTHER): Payer: Medicare Other | Admitting: *Deleted

## 2013-09-11 DIAGNOSIS — I441 Atrioventricular block, second degree: Secondary | ICD-10-CM | POA: Diagnosis not present

## 2013-09-11 LAB — MDC_IDC_ENUM_SESS_TYPE_INCLINIC
Battery Remaining Longevity: 114 mo
Battery Voltage: 2.79 V
Brady Statistic AP VP Percent: 19 %
Brady Statistic AP VS Percent: 0 %
Brady Statistic AS VP Percent: 81 %
Brady Statistic AS VS Percent: 0 %
Date Time Interrogation Session: 20150513114855
Lead Channel Impedance Value: 499 Ohm
Lead Channel Pacing Threshold Amplitude: 0.5 V
Lead Channel Pacing Threshold Amplitude: 0.75 V
Lead Channel Pacing Threshold Pulse Width: 0.4 ms
Lead Channel Sensing Intrinsic Amplitude: 2 mV
Lead Channel Sensing Intrinsic Amplitude: 8 mV
Lead Channel Setting Pacing Amplitude: 3.5 V
Lead Channel Setting Pacing Pulse Width: 0.4 ms
MDC IDC MSMT BATTERY IMPEDANCE: 100 Ohm
MDC IDC MSMT LEADCHNL RA IMPEDANCE VALUE: 618 Ohm
MDC IDC MSMT LEADCHNL RV PACING THRESHOLD PULSEWIDTH: 0.4 ms
MDC IDC SET LEADCHNL RV PACING AMPLITUDE: 3.5 V
MDC IDC SET LEADCHNL RV SENSING SENSITIVITY: 4 mV

## 2013-09-11 NOTE — Progress Notes (Signed)
Wound check appointment. Steri-strips removed. Small area of the incision was not completely closed so steri-strips were reapplied.  Wound without redness or edema.  Normal device function. Thresholds, sensing, and impedances consistent with implant measurements. Device programmed at 3.5V/auto capture programmed on for extra safety margin until 3 month visit. Histogram distribution appropriate for patient and level of activity. 15 mode switches, <0.1%, + ASA.  No high ventricular rates noted. Patient educated about wound care, arm mobility, lifting restrictions. ROV in 3 months with implanting physician.

## 2013-09-11 NOTE — Telephone Encounter (Signed)
Will discuss with patient and daughter when patient is seen tomorrow for hospital follow-up.

## 2013-09-12 ENCOUNTER — Ambulatory Visit: Payer: Medicare Other | Admitting: Family Medicine

## 2013-09-15 DIAGNOSIS — J449 Chronic obstructive pulmonary disease, unspecified: Secondary | ICD-10-CM | POA: Diagnosis not present

## 2013-09-15 DIAGNOSIS — Z950 Presence of cardiac pacemaker: Secondary | ICD-10-CM | POA: Diagnosis not present

## 2013-09-15 DIAGNOSIS — I5031 Acute diastolic (congestive) heart failure: Secondary | ICD-10-CM

## 2013-09-15 DIAGNOSIS — I129 Hypertensive chronic kidney disease with stage 1 through stage 4 chronic kidney disease, or unspecified chronic kidney disease: Secondary | ICD-10-CM

## 2013-09-15 DIAGNOSIS — I509 Heart failure, unspecified: Secondary | ICD-10-CM

## 2013-09-15 DIAGNOSIS — N183 Chronic kidney disease, stage 3 unspecified: Secondary | ICD-10-CM | POA: Diagnosis not present

## 2013-09-15 DIAGNOSIS — M1990 Unspecified osteoarthritis, unspecified site: Secondary | ICD-10-CM | POA: Diagnosis not present

## 2013-09-15 DIAGNOSIS — Z472 Encounter for removal of internal fixation device: Secondary | ICD-10-CM | POA: Diagnosis not present

## 2013-09-17 ENCOUNTER — Encounter: Payer: Self-pay | Admitting: Internal Medicine

## 2013-09-17 DIAGNOSIS — I5031 Acute diastolic (congestive) heart failure: Secondary | ICD-10-CM | POA: Diagnosis not present

## 2013-09-17 DIAGNOSIS — N183 Chronic kidney disease, stage 3 unspecified: Secondary | ICD-10-CM | POA: Diagnosis not present

## 2013-09-17 DIAGNOSIS — Z950 Presence of cardiac pacemaker: Secondary | ICD-10-CM | POA: Diagnosis not present

## 2013-09-17 DIAGNOSIS — I509 Heart failure, unspecified: Secondary | ICD-10-CM | POA: Diagnosis not present

## 2013-09-17 DIAGNOSIS — I129 Hypertensive chronic kidney disease with stage 1 through stage 4 chronic kidney disease, or unspecified chronic kidney disease: Secondary | ICD-10-CM | POA: Diagnosis not present

## 2013-09-17 DIAGNOSIS — J449 Chronic obstructive pulmonary disease, unspecified: Secondary | ICD-10-CM | POA: Diagnosis not present

## 2013-09-18 ENCOUNTER — Ambulatory Visit: Payer: Medicare Other | Admitting: Neurology

## 2013-09-18 ENCOUNTER — Other Ambulatory Visit: Payer: Self-pay | Admitting: Obstetrics

## 2013-09-18 DIAGNOSIS — Z950 Presence of cardiac pacemaker: Secondary | ICD-10-CM | POA: Diagnosis not present

## 2013-09-18 DIAGNOSIS — I5031 Acute diastolic (congestive) heart failure: Secondary | ICD-10-CM | POA: Diagnosis not present

## 2013-09-18 DIAGNOSIS — J449 Chronic obstructive pulmonary disease, unspecified: Secondary | ICD-10-CM | POA: Diagnosis not present

## 2013-09-18 DIAGNOSIS — N183 Chronic kidney disease, stage 3 unspecified: Secondary | ICD-10-CM | POA: Diagnosis not present

## 2013-09-18 DIAGNOSIS — I509 Heart failure, unspecified: Secondary | ICD-10-CM | POA: Diagnosis not present

## 2013-09-18 DIAGNOSIS — I129 Hypertensive chronic kidney disease with stage 1 through stage 4 chronic kidney disease, or unspecified chronic kidney disease: Secondary | ICD-10-CM | POA: Diagnosis not present

## 2013-09-19 ENCOUNTER — Encounter: Payer: Self-pay | Admitting: Family Medicine

## 2013-09-19 ENCOUNTER — Ambulatory Visit (INDEPENDENT_AMBULATORY_CARE_PROVIDER_SITE_OTHER): Payer: Medicare Other | Admitting: Family Medicine

## 2013-09-19 VITALS — BP 131/73 | HR 80 | Temp 97.7°F | Wt 128.0 lb

## 2013-09-19 DIAGNOSIS — M109 Gout, unspecified: Secondary | ICD-10-CM | POA: Diagnosis not present

## 2013-09-19 DIAGNOSIS — R110 Nausea: Secondary | ICD-10-CM

## 2013-09-19 DIAGNOSIS — I10 Essential (primary) hypertension: Secondary | ICD-10-CM | POA: Diagnosis not present

## 2013-09-19 DIAGNOSIS — R51 Headache: Secondary | ICD-10-CM

## 2013-09-19 DIAGNOSIS — D649 Anemia, unspecified: Secondary | ICD-10-CM | POA: Diagnosis not present

## 2013-09-19 LAB — POCT HEMOGLOBIN: Hemoglobin: 11.4 g/dL — AB (ref 12.2–16.2)

## 2013-09-19 LAB — BASIC METABOLIC PANEL
BUN: 18 mg/dL (ref 6–23)
CHLORIDE: 94 meq/L — AB (ref 96–112)
CO2: 31 mEq/L (ref 19–32)
Calcium: 9.5 mg/dL (ref 8.4–10.5)
Creat: 1.09 mg/dL (ref 0.50–1.10)
Glucose, Bld: 90 mg/dL (ref 70–99)
Potassium: 4.3 mEq/L (ref 3.5–5.3)
SODIUM: 132 meq/L — AB (ref 135–145)

## 2013-09-19 MED ORDER — BACLOFEN 10 MG PO TABS: 5.0000 mg | ORAL_TABLET | Freq: Three times a day (TID) | ORAL | Status: DC | PRN

## 2013-09-19 MED ORDER — SENNOSIDES-DOCUSATE SODIUM 8.6-50 MG PO TABS: 1.0000 | ORAL_TABLET | Freq: Every day | ORAL | Status: DC

## 2013-09-19 NOTE — Patient Instructions (Signed)
Nice to see you. Please let the physical therapist know that Ms Spitzley needs to have help loosening up her neck muscles. The baclofen should help with this, but the ultimate answer will be having PT work with her on this. She should take the sennokot-s for her constipation. Take this for 2 days with the miralax and let me know if this helps. She should take the allopurinol for her gout, she can stop the colchicine and take this only if she has a gout flare. I will see you back in one month.

## 2013-09-20 ENCOUNTER — Telehealth: Payer: Self-pay | Admitting: Family Medicine

## 2013-09-20 DIAGNOSIS — Z950 Presence of cardiac pacemaker: Secondary | ICD-10-CM | POA: Diagnosis not present

## 2013-09-20 DIAGNOSIS — N183 Chronic kidney disease, stage 3 unspecified: Secondary | ICD-10-CM | POA: Diagnosis not present

## 2013-09-20 DIAGNOSIS — I509 Heart failure, unspecified: Secondary | ICD-10-CM | POA: Diagnosis not present

## 2013-09-20 DIAGNOSIS — J449 Chronic obstructive pulmonary disease, unspecified: Secondary | ICD-10-CM | POA: Diagnosis not present

## 2013-09-20 DIAGNOSIS — I129 Hypertensive chronic kidney disease with stage 1 through stage 4 chronic kidney disease, or unspecified chronic kidney disease: Secondary | ICD-10-CM | POA: Diagnosis not present

## 2013-09-20 DIAGNOSIS — I5031 Acute diastolic (congestive) heart failure: Secondary | ICD-10-CM | POA: Diagnosis not present

## 2013-09-20 MED ORDER — LEVOTHYROXINE SODIUM 125 MCG PO TABS: ORAL_TABLET | ORAL | Status: DC

## 2013-09-20 MED ORDER — BACLOFEN 10 MG PO TABS: 5.0000 mg | ORAL_TABLET | Freq: Three times a day (TID) | ORAL | Status: DC | PRN

## 2013-09-20 MED ORDER — SENNOSIDES-DOCUSATE SODIUM 8.6-50 MG PO TABS
1.0000 | ORAL_TABLET | Freq: Every day | ORAL | Status: DC
Start: 2013-09-20 — End: 2013-10-08

## 2013-09-20 NOTE — Telephone Encounter (Signed)
Daughter called because on 5/21 two prescription's were sent to [facility] on [address]. We need to send them to [facility] on [address]. jw

## 2013-09-20 NOTE — Telephone Encounter (Signed)
Sent to [facility].

## 2013-09-20 NOTE — Telephone Encounter (Signed)
Will forward to MD to resend. [name],CMA

## 2013-09-21 DIAGNOSIS — Z8739 Personal history of other diseases of the musculoskeletal system and connective tissue: Secondary | ICD-10-CM

## 2013-09-21 DIAGNOSIS — M109 Gout, unspecified: Secondary | ICD-10-CM

## 2013-09-21 HISTORY — DX: Gout, unspecified: M10.9

## 2013-09-21 HISTORY — DX: Personal history of other diseases of the musculoskeletal system and connective tissue: Z87.39

## 2013-09-21 NOTE — Assessment & Plan Note (Signed)
Patient notes mostly posterior headache and on exam there is associated spasm in her trapezius muscles bilaterally. Likely the spasm is leading to a tension headache. There are no neurological deficits on exam to reveal a neurological cause. Will treat spasm with baclofen 5 mg TID prn. Will write a letter to PT asking that they work with the patient on her neck to help loosen it up in hopes that this will improve her headache.

## 2013-09-21 NOTE — Progress Notes (Signed)
Patient ID: HENESSY MCCRANEY, female   DOB: 1919/12/02, 80 y.o.   MRN: [id number]  [name], MD Phone: [phone]  NYELLIE WINDING is a 80 y.o. female who presents today for f/u.  Nausea: patient reports nausea over the past several months. There has been no vomiting. Daughter thinks she may have reflux. There is no burning or sour taste in the back of her mouth. Though she has had a sour taste in the back of her mouth previously. She denies abdominal pain. She does note constipation. She has been having BMs about every 3 days. They have been hard balls. She has been taking miralax 1-2 cups a day. Notes has also had an episode of diarrhea.  Headache: notes this is a heaviness in the back of her head. Also occasionally the front. Her daughter thinks it could be allergies. There is no congestion, postnasal drip, cough, or ear fullness. She does note that the patient has been tight in her neck and that PT has stated this. PT has been coming to their house to work with the patient.  Gout: patient notes ankle swelling is much improved. Wants to know if she still needs to be taking both colchicine and allopurinol.  Patient is a nonsmoker.   ROS: Per HPI   Physical Exam Filed Vitals:   09/19/13 1343  BP: 131/73  Pulse: 80  Temp: 97.7 F (36.5 C)    Gen: Well NAD HEENT: PERRL,  MMM Lungs: CTABL Nl WOB Heart: RRR no MRG Abd: NABS, NT, ND Exts: Non edematous BL  LE, warm and well perfused.  Neuro: CN 2-12 intact, 5/5 strength in bilateral biceps, triceps, grip, sensation to light touch intact in bilateral upper extremities MSK: spasm noted in bilateral trapezius proximal distribution, there is tenderness in this distribution, there is no midline spine tenderness    Assessment/Plan: Please see individual problem list.

## 2013-09-21 NOTE — Assessment & Plan Note (Signed)
Patient with continued nausea in association with constipation. Patient has lost 16 lbs over the past month, likely partially related to her acute illness that required hospitalization and possibly related to her nausea and decreased appetite. Likely culprit at this time is her constipation. Will continue to treat this with miralax. Will add sennokot to further help with this issue. At future visits if the weight loss is continuing to be an issue will need to add nutritional supplement and consider additional causes of her nausea.

## 2013-09-21 NOTE — Assessment & Plan Note (Signed)
Diagnosed while recently hospitalized and started on colchicine and allopurinol. Patient improved at this time and can d/c colchicine ad continue allopurinol.

## 2013-09-24 ENCOUNTER — Encounter: Payer: Medicare Other | Admitting: Physician Assistant

## 2013-09-24 DIAGNOSIS — J449 Chronic obstructive pulmonary disease, unspecified: Secondary | ICD-10-CM | POA: Diagnosis not present

## 2013-09-24 DIAGNOSIS — I509 Heart failure, unspecified: Secondary | ICD-10-CM | POA: Diagnosis not present

## 2013-09-24 DIAGNOSIS — Z950 Presence of cardiac pacemaker: Secondary | ICD-10-CM | POA: Diagnosis not present

## 2013-09-24 DIAGNOSIS — N183 Chronic kidney disease, stage 3 unspecified: Secondary | ICD-10-CM | POA: Diagnosis not present

## 2013-09-24 DIAGNOSIS — I129 Hypertensive chronic kidney disease with stage 1 through stage 4 chronic kidney disease, or unspecified chronic kidney disease: Secondary | ICD-10-CM | POA: Diagnosis not present

## 2013-09-24 DIAGNOSIS — I5031 Acute diastolic (congestive) heart failure: Secondary | ICD-10-CM | POA: Diagnosis not present

## 2013-09-25 ENCOUNTER — Encounter: Payer: Self-pay | Admitting: Physician Assistant

## 2013-09-25 ENCOUNTER — Ambulatory Visit (INDEPENDENT_AMBULATORY_CARE_PROVIDER_SITE_OTHER): Payer: Medicare Other | Admitting: Physician Assistant

## 2013-09-25 VITALS — BP 130/60 | HR 77 | Ht <= 58 in | Wt 128.0 lb

## 2013-09-25 DIAGNOSIS — N183 Chronic kidney disease, stage 3 unspecified: Secondary | ICD-10-CM

## 2013-09-25 DIAGNOSIS — I5031 Acute diastolic (congestive) heart failure: Secondary | ICD-10-CM | POA: Diagnosis not present

## 2013-09-25 DIAGNOSIS — I509 Heart failure, unspecified: Secondary | ICD-10-CM | POA: Diagnosis not present

## 2013-09-25 DIAGNOSIS — I441 Atrioventricular block, second degree: Secondary | ICD-10-CM

## 2013-09-25 DIAGNOSIS — Z950 Presence of cardiac pacemaker: Secondary | ICD-10-CM | POA: Diagnosis not present

## 2013-09-25 DIAGNOSIS — I5032 Chronic diastolic (congestive) heart failure: Secondary | ICD-10-CM | POA: Diagnosis not present

## 2013-09-25 DIAGNOSIS — K5900 Constipation, unspecified: Secondary | ICD-10-CM

## 2013-09-25 DIAGNOSIS — J449 Chronic obstructive pulmonary disease, unspecified: Secondary | ICD-10-CM | POA: Diagnosis not present

## 2013-09-25 DIAGNOSIS — I129 Hypertensive chronic kidney disease with stage 1 through stage 4 chronic kidney disease, or unspecified chronic kidney disease: Secondary | ICD-10-CM | POA: Diagnosis not present

## 2013-09-25 DIAGNOSIS — M109 Gout, unspecified: Secondary | ICD-10-CM | POA: Diagnosis not present

## 2013-09-25 NOTE — Patient Instructions (Signed)
KEEP YOUR FOLLOW UP WITH DR. ALLRED IN 11/2013  NO CHANGES WERE MADE TODAY

## 2013-09-25 NOTE — Progress Notes (Signed)
Cardiology Office Note   Date:  09/25/2013   ID:  Jordan [name], Jordan [name] 01-06-1920, MRN [id number]  PCP:  [name], MD  Cardiologist:  Dr. Cristopher Peru      History of Present Illness: Jordan [name] is a 80 y.o. female with a hx of HTN, COPD, CKD, DJD, unexplained syncope s/p ILR.  ILR has demonstrated a single episode of ATach/AFib.  Last seen by Dr. Cristopher Peru in 06/2013.  She was recently admitted 4/30-5/6 with symptomatic Mobitz II 2nd degree HB.  She underwent implantation of a Medtronic Adapta L dual-chamber pacemaker by Dr. [name] on 08/30/13.  ILR was explanted.  Hospitalization was c/b a/c diastolic CHF.  She was diuresed. She developed AKI on CKD.  Creatinine returned to baseline at d/c.  She had L ankle pain and swelling and was seen by orthopedics.  Uric Acid level was high and this was felt to be c/w gout.  NSAIDs, allopurinol were recommended.  She was also seen by ID who did not feel she had an infectious process.  She was discharged to SNF. She has been set up for appropriate follow up with EP. This appointment was scheduled to follow up on her diastolic CHF.  She is here with her sons.  She is now at home.  Weights have been stable.  She denies dyspnea or chest pain.  She denies syncope, orthopnea, PND, edema.  She has had problems with constipation and assoc nausea as well as headaches.  She sees neuro next week for HAs.     Studies:  - Echo (08/30/13):  Mild LVH, EF 60-65%, normal wall motion, grade 1 diastolic dysfunction, trivial AI, MAC, trivial MR, moderate LAE, mild RAE  - Carotid US (01/2013):  Bilateral ICA 1-39%   Recent Labs: 08/30/2013: ALT 93*; TSH 5.560*  09/02/2013: Pro B Natriuretic peptide (BNP) 10568.0*  09/19/2013: Creatinine 1.09; Hemoglobin 11.4*; Potassium 4.3   Wt Readings from Last 3 Encounters:  09/25/13 128 lb (58.06 kg)  09/19/13 128 lb (58.06 kg)  09/04/13 136 lb 3.2 oz (61.78 kg)     Past Medical History  Diagnosis Date  . Cervicalgia     . Peripheral neuropathy   . Hypertension   . Osteoporosis   . Insomnia   . Arthritis   . Headache   . Hypothyroidism   . Right rotator cuff tear   . OA (osteoarthritis)     RIGHT SHOULDER AC JOINT  . History of syncope     01/2013  DX  CYRPTOGENIC STROKE  . Moderate dementia   . Gait instability   . First degree heart block   . History of colon polyps   . Diverticulosis of colon   . SUI (stress urinary incontinence, female)   . Lumbar stenosis   . Status post placement of implantable loop recorder     02-08-2013  . Cryptogenic stroke S/P  LOOP RECORDER IMPLANT    DX  01/2013  --  SYNCOPAL EPISODE --  EPISODIC  ATRIAL TACHY/ FIBULATION  AND PAUSES  . Frequency of urination   . Short of breath on exertion   . Chronic constipation   . Glaucoma of both eyes   . Hearing loss of both ears     no hearing aids  . Dry eyes   . Pneumonia     Current Outpatient Prescriptions  Medication Sig Dispense Refill  . acetaminophen (TYLENOL) 325 MG tablet Take 650 mg by mouth every 6 (six) hours as  needed.      Marland Kitchen allopurinol (ZYLOPRIM) 100 MG tablet Take 1 tablet (100 mg total) by mouth daily.  30 tablet  12  . aspirin 81 MG tablet Take 81 mg by mouth daily.      . baclofen (LIORESAL) 10 MG tablet Take 0.5 tablets (5 mg total) by mouth 3 (three) times daily as needed for muscle spasms.  30 each  0  . Calcium Carbonate-Vitamin D (CALTRATE 600+D) 600-400 MG-UNIT per tablet Take 1 tablet by mouth 2 (two) times daily.        . cloNIDine (CATAPRES) 0.1 MG tablet Take 1 tablet (0.1 mg total) by mouth 2 (two) times daily.  60 tablet  1  . colchicine (COLCRYS) 0.6 MG tablet Take 1 tablet (0.6 mg total) by mouth 2 (two) times daily.  60 tablet  3  . docusate sodium (COLACE) 100 MG capsule Take 1 capsule (100 mg total) by mouth 2 (two) times daily.  60 capsule  0  . furosemide (LASIX) 20 MG tablet Take 1 tablet (20 mg total) by mouth daily.  30 tablet  12  . Glucosamine-Chondroitin-Vit D3  1500-1200-800 MG-MG-UNIT PACK Take 1 tablet by mouth 2 (two) times daily.        Marland Kitchen levothyroxine (SYNTHROID, LEVOTHROID) 125 MCG tablet take 1 tablet by mouth once daily--  takes in am  30 tablet  3  . polyethylene glycol (MIRALAX) packet Take 17 g by mouth daily.  14 each  0  . Prenatal Vit-Fe Fumarate-FA (PREPLUS) 27-1 MG TABS TAKE ONE TABLET BY MOUTH ONCE DAILY  90 tablet  0  . RESTASIS 0.05 % ophthalmic emulsion Place 1 drop into both eyes daily.       Marland Kitchen senna-docusate (SENOKOT-S) 8.6-50 MG per tablet Take 1 tablet by mouth daily. For 2 days.  10 tablet  0  . traMADol (ULTRAM) 50 MG tablet Take 50 mg by mouth 2 (two) times daily.      . TRAVATAN Z 0.004 % SOLN ophthalmic solution Place 1 drop into both eyes at bedtime.       . traZODone (DESYREL) 50 MG tablet Take 50 mg by mouth at bedtime as needed for sleep.      . trospium (SANCTURA) 20 MG tablet Take 20 mg by mouth 2 (two) times daily.       Marland Kitchen lidocaine (LIDODERM) 5 % APPLY 1 PATCH TO SKIN DAILY. MAY CUT PATCH AND USE 1/4 OR 1/2 OF PATCH INSTEAD  30 patch  0  . ondansetron (ZOFRAN) 4 MG tablet nausea      . VOLTAREN 1 % GEL Cream for pain       No current facility-administered medications for this visit.    Allergies:   Amlodipine; Chlorthalidone; Verapamil; Adhesive; and Honey bee treatment   Social History:  The patient  reports that she has never smoked. She has never used smokeless tobacco. She reports that she does not drink alcohol or use illicit drugs.   Family History:  The patient's family history includes Kidney disease in her mother.   ROS:  Please see the history of present illness.  She notes fatigue.    All other systems reviewed and negative.   PHYSICAL EXAM: VS:  BP 130/60  Pulse 77  Ht 4\' 9"  (1.448 m)  Wt 128 lb (58.06 kg)  BMI 27.69 kg/m2 Well nourished, well developed, in no acute distress HEENT: normal Neck: no JVDat 90 Cardiac:  normal S1, S2; RRR; no murmur Lungs:  clear to auscultation bilaterally,  no wheezing, rhonchi or rales Abd: soft, nontender, no hepatomegaly Ext: no edema Skin: warm and dry Neuro:  CNs 2-12 intact, no focal abnormalities noted  EKG:  Underlying NSR, Vpaced HR 77     ASSESSMENT AND PLAN:  1. Chronic diastolic heart failure:  Volume stable.  Continue current Rx.  2. Mobitz type 2 second degree heart block s/p Pacemaker:  F/u with EP as planned. 3. CKD (chronic kidney disease) stage 3, GFR 30-59 ml/min:  Recent creatinine stable.  4. Gout:  F/u with PCP. 5. Constipation:  F/u with PCP. 6. Disposition:  F/u with Dr. [name] as planned.    Signed, Versie Starks, MHS 09/25/2013 10:38 AM    [facility] [address], [place], [place]  [address] Phone: [phone]; Fax: [phone]

## 2013-09-26 DIAGNOSIS — I129 Hypertensive chronic kidney disease with stage 1 through stage 4 chronic kidney disease, or unspecified chronic kidney disease: Secondary | ICD-10-CM | POA: Diagnosis not present

## 2013-09-26 DIAGNOSIS — I5031 Acute diastolic (congestive) heart failure: Secondary | ICD-10-CM | POA: Diagnosis not present

## 2013-09-26 DIAGNOSIS — Z950 Presence of cardiac pacemaker: Secondary | ICD-10-CM | POA: Diagnosis not present

## 2013-09-26 DIAGNOSIS — J449 Chronic obstructive pulmonary disease, unspecified: Secondary | ICD-10-CM | POA: Diagnosis not present

## 2013-09-26 DIAGNOSIS — I509 Heart failure, unspecified: Secondary | ICD-10-CM | POA: Diagnosis not present

## 2013-09-26 DIAGNOSIS — N183 Chronic kidney disease, stage 3 unspecified: Secondary | ICD-10-CM | POA: Diagnosis not present

## 2013-09-27 DIAGNOSIS — I129 Hypertensive chronic kidney disease with stage 1 through stage 4 chronic kidney disease, or unspecified chronic kidney disease: Secondary | ICD-10-CM | POA: Diagnosis not present

## 2013-09-27 DIAGNOSIS — I509 Heart failure, unspecified: Secondary | ICD-10-CM | POA: Diagnosis not present

## 2013-09-27 DIAGNOSIS — Z950 Presence of cardiac pacemaker: Secondary | ICD-10-CM | POA: Diagnosis not present

## 2013-09-27 DIAGNOSIS — I5031 Acute diastolic (congestive) heart failure: Secondary | ICD-10-CM | POA: Diagnosis not present

## 2013-09-27 DIAGNOSIS — J449 Chronic obstructive pulmonary disease, unspecified: Secondary | ICD-10-CM | POA: Diagnosis not present

## 2013-09-27 DIAGNOSIS — N183 Chronic kidney disease, stage 3 unspecified: Secondary | ICD-10-CM | POA: Diagnosis not present

## 2013-09-30 ENCOUNTER — Ambulatory Visit: Payer: Medicare Other | Admitting: Neurology

## 2013-09-30 DIAGNOSIS — Z950 Presence of cardiac pacemaker: Secondary | ICD-10-CM | POA: Diagnosis not present

## 2013-09-30 DIAGNOSIS — I129 Hypertensive chronic kidney disease with stage 1 through stage 4 chronic kidney disease, or unspecified chronic kidney disease: Secondary | ICD-10-CM | POA: Diagnosis not present

## 2013-09-30 DIAGNOSIS — I5031 Acute diastolic (congestive) heart failure: Secondary | ICD-10-CM | POA: Diagnosis not present

## 2013-09-30 DIAGNOSIS — I509 Heart failure, unspecified: Secondary | ICD-10-CM | POA: Diagnosis not present

## 2013-09-30 DIAGNOSIS — J449 Chronic obstructive pulmonary disease, unspecified: Secondary | ICD-10-CM | POA: Diagnosis not present

## 2013-09-30 DIAGNOSIS — N183 Chronic kidney disease, stage 3 unspecified: Secondary | ICD-10-CM | POA: Diagnosis not present

## 2013-10-02 ENCOUNTER — Telehealth: Payer: Self-pay | Admitting: *Deleted

## 2013-10-02 ENCOUNTER — Ambulatory Visit: Payer: Self-pay | Admitting: Neurology

## 2013-10-02 DIAGNOSIS — I129 Hypertensive chronic kidney disease with stage 1 through stage 4 chronic kidney disease, or unspecified chronic kidney disease: Secondary | ICD-10-CM | POA: Diagnosis not present

## 2013-10-02 DIAGNOSIS — H9190 Unspecified hearing loss, unspecified ear: Secondary | ICD-10-CM

## 2013-10-02 DIAGNOSIS — I509 Heart failure, unspecified: Secondary | ICD-10-CM | POA: Diagnosis not present

## 2013-10-02 DIAGNOSIS — Z950 Presence of cardiac pacemaker: Secondary | ICD-10-CM | POA: Diagnosis not present

## 2013-10-02 DIAGNOSIS — J449 Chronic obstructive pulmonary disease, unspecified: Secondary | ICD-10-CM | POA: Diagnosis not present

## 2013-10-02 DIAGNOSIS — I5031 Acute diastolic (congestive) heart failure: Secondary | ICD-10-CM | POA: Diagnosis not present

## 2013-10-02 DIAGNOSIS — N183 Chronic kidney disease, stage 3 unspecified: Secondary | ICD-10-CM | POA: Diagnosis not present

## 2013-10-02 MED ORDER — LIDOCAINE 5 % EX PTCH: MEDICATED_PATCH | CUTANEOUS | Status: DC

## 2013-10-02 NOTE — Telephone Encounter (Signed)
Nira Conn, RN with [facility] called requesting to see pt next week (pt request) and the following week.  Also requesting a referral for a speech therapist due to pt having difficultly swallowing.  Please give her a call at [phone].  Derl Barrow, RN

## 2013-10-02 NOTE — Telephone Encounter (Signed)
Jordan [name] about the status of patient's ENT referral to Dr. Sophronia Simas office.  Also she wants to know if you filled out form for lidoderm patches that she gave you at the last office visit. Jb Dulworth,CMA

## 2013-10-03 ENCOUNTER — Encounter: Payer: Self-pay | Admitting: Neurology

## 2013-10-03 ENCOUNTER — Ambulatory Visit (INDEPENDENT_AMBULATORY_CARE_PROVIDER_SITE_OTHER): Payer: Medicare Other | Admitting: Neurology

## 2013-10-03 VITALS — BP 143/80 | HR 83 | Ht 59.0 in | Wt 127.0 lb

## 2013-10-03 DIAGNOSIS — R5383 Other fatigue: Secondary | ICD-10-CM | POA: Diagnosis not present

## 2013-10-03 DIAGNOSIS — I10 Essential (primary) hypertension: Secondary | ICD-10-CM

## 2013-10-03 DIAGNOSIS — R531 Weakness: Secondary | ICD-10-CM

## 2013-10-03 DIAGNOSIS — I6350 Cerebral infarction due to unspecified occlusion or stenosis of unspecified cerebral artery: Secondary | ICD-10-CM

## 2013-10-03 DIAGNOSIS — K432 Incisional hernia without obstruction or gangrene: Secondary | ICD-10-CM | POA: Diagnosis not present

## 2013-10-03 DIAGNOSIS — R5381 Other malaise: Secondary | ICD-10-CM | POA: Diagnosis not present

## 2013-10-03 MED ORDER — GABAPENTIN 100 MG PO CAPS: 100.0000 mg | ORAL_CAPSULE | Freq: Three times a day (TID) | ORAL | Status: DC

## 2013-10-03 MED ORDER — MELOXICAM 7.5 MG PO TABS: ORAL_TABLET | ORAL | Status: DC

## 2013-10-03 MED ORDER — OXYCODONE-ACETAMINOPHEN 5-325 MG PO TABS: 1.0000 | ORAL_TABLET | Freq: Three times a day (TID) | ORAL | Status: DC | PRN

## 2013-10-03 NOTE — Telephone Encounter (Signed)
I attempted to call Heather back. It is fine for them to go out to see the patient next week and the following week. You can give a verbal order for speech therapy, though if the patient is having difficulty swallowing she should be advised to come in for evaluation.

## 2013-10-03 NOTE — Telephone Encounter (Signed)
I discussed the referral to Dr Erik Obey with [facility]. We can do the referral for evaluation, though ENT is not necessarily the appropriate person to send for this. Audiology would be the appropriate person. The referral may or may not be covered by insurance and the hearing aids they are requesting would not be covered by insurance and the cost would be out of pocket for the patient. This whole process could be quite expensive for the patient. If the daughter understands all of this and still desires a referral, I will be more than happy to make the referral.   Additionally I resent the lidoderm patch prescription with a new indication listed. If this does not go through this time I will fill out the form for the insurance company trying to get this for the patient. In my research the patient does not have a qualifying diagnosis for this medication at this time, so the insurance company may not cover this medication.  Please advise the patient of this. Thanks.

## 2013-10-03 NOTE — Telephone Encounter (Signed)
Left voice message for Nira Conn, RN to call nurse back regarding verbal order for speech therapy and follow up home visits.  Derl Barrow, RN

## 2013-10-03 NOTE — Progress Notes (Signed)
PATIENT: Jordan [name] DOB: Jun 18, 1919  HISTORICAL  EMMILIA [name] is a 80 yo female native of [place],, is referred by her primary care physician Dr. Ron Agee, accompanied by her daughter Stanton Kidney for evluation of headache and nausea. She only speaks Spanish, her daughter Stanton Kidney translated for her, who is also her power of attorney,  She was previously quite healthy, in October 2014, she had a transient loss of consciousness, had Loop recorder placement  She had a right shoulder rotator cuff, planning on to have arthroscopic surgery in May first 2015, during pre-surgical clearance, she was found to have elevated blood pressure, systolic up to 373S, also bradycardia, heart rate 30 and 40,  She complains of excessive fatigue, with persistent bradycardia, she was found to have symptomatic mobitz II second degree AV block and syncope  In May first 2015, she underwent. Pacemaker implantation. Implantable loop recorder removal by cardiologist Dr. Rayann Heman.  She denies a previous history of headaches, but over the past 3 months, since February 2015, she has been complaining of frequent holoacranial pressure headaches, pressure sensation, along her spine, no significant visual loss, gait difficulty due to low back pain, joints pain, right shoulder pain, she also complains of excessive fatigue, much less active  We have reviewed,  CAT scan of the brain without contrast in October 2014: Atrophy and chronic small-vessel white matter ischemic changes are  again noted. A left supraclinoid internal carotid artery aneurysm appears unchanged. No acute intracranial abnormalities are identified  She no longer taking long-term NSAIDs because chronic kidney disease,   REVIEW OF SYSTEMS: Full 14 system review of systems performed and notable only for fatigue, swelling in legs, hearing loss, trouble swallowing, blurry vision, incontinence, constipation, easy bruising, feeling hot, increased thirst, joint pain, achy muscles,  confusion, headaches, numbness, weakness, difficulty swallowing, dizziness, passing out, insomnia, anxiety, decreased energy, disinterested in activities   ALLERGIES: Allergies  Allergen Reactions  . Amlodipine Other (See Comments)    Peripheral edema with 2.36m daily dose  . Chlorthalidone Other (See Comments)    Hyponatremia  . Verapamil Other (See Comments)    Bradycardia - HR  35-45 on 47mTID  . Adhesive [Tape] Rash and Other (See Comments)    Skin irritation  . Honey Bee Treatment [Bee Venom] Rash    Rash     HOME MEDICATIONS: Current Outpatient Prescriptions on File Prior to Visit  Medication Sig Dispense Refill  . acetaminophen (TYLENOL) 325 MG tablet Take 650 mg by mouth every 6 (six) hours as needed.      . Marland Kitchenllopurinol (ZYLOPRIM) 100 MG tablet Take 1 tablet (100 mg total) by mouth daily.  30 tablet  12  . aspirin 81 MG tablet Take 81 mg by mouth daily.      . baclofen (LIORESAL) 10 MG tablet Take 0.5 tablets (5 mg total) by mouth 3 (three) times daily as needed for muscle spasms.  30 each  0  . Calcium Carbonate-Vitamin D (CALTRATE 600+D) 600-400 MG-UNIT per tablet Take 1 tablet by mouth 2 (two) times daily.        . cloNIDine (CATAPRES) 0.1 MG tablet Take 1 tablet (0.1 mg total) by mouth 2 (two) times daily.  60 tablet  1  . colchicine (COLCRYS) 0.6 MG tablet Take 1 tablet (0.6 mg total) by mouth 2 (two) times daily.  60 tablet  3  . docusate sodium (COLACE) 100 MG capsule Take 1 capsule (100 mg total) by mouth 2 (two) times daily.  60 capsule  0  . furosemide (LASIX) 20 MG tablet Take 1 tablet (20 mg total) by mouth daily.  30 tablet  12  . Glucosamine-Chondroitin-Vit D3 1500-1200-800 MG-MG-UNIT PACK Take 1 tablet by mouth 2 (two) times daily.        Marland Kitchen levothyroxine (SYNTHROID, LEVOTHROID) 125 MCG tablet take 1 tablet by mouth once daily--  takes in am  30 tablet  3  . lidocaine (LIDODERM) 5 % APPLY 1/4 PATCH TO SKIN DAILY.  30 patch  0  . ondansetron (ZOFRAN) 4 MG tablet  nausea      . polyethylene glycol (MIRALAX) packet Take 17 g by mouth daily.  14 each  0  . Prenatal Vit-Fe Fumarate-FA (PREPLUS) 27-1 MG TABS TAKE ONE TABLET BY MOUTH ONCE DAILY  90 tablet  0  . RESTASIS 0.05 % ophthalmic emulsion Place 1 drop into both eyes daily.       Marland Kitchen senna-docusate (SENOKOT-S) 8.6-50 MG per tablet Take 1 tablet by mouth daily. For 2 days.  10 tablet  0  . traMADol (ULTRAM) 50 MG tablet Take 50 mg by mouth 2 (two) times daily.      . TRAVATAN Z 0.004 % SOLN ophthalmic solution Place 1 drop into both eyes at bedtime.       . traZODone (DESYREL) 50 MG tablet Take 50 mg by mouth at bedtime as needed for sleep.      . trospium (SANCTURA) 20 MG tablet Take 20 mg by mouth 2 (two) times daily.       . VOLTAREN 1 % GEL Cream for pain         PAST MEDICAL HISTORY: Past Medical History  Diagnosis Date  . Cervicalgia   . Peripheral neuropathy   . Hypertension   . Osteoporosis   . Insomnia   . Arthritis   . Headache   . Hypothyroidism   . Right rotator cuff tear   . OA (osteoarthritis)     RIGHT SHOULDER AC JOINT  . History of syncope     01/2013  DX  CYRPTOGENIC STROKE  . Moderate dementia   . Gait instability   . First degree heart block   . History of colon polyps   . Diverticulosis of colon   . SUI (stress urinary incontinence, female)   . Lumbar stenosis   . Status post placement of implantable loop recorder     02-08-2013  . Cryptogenic stroke S/P  LOOP RECORDER IMPLANT    DX  01/2013  --  SYNCOPAL EPISODE --  EPISODIC  ATRIAL TACHY/ FIBULATION  AND PAUSES  . Frequency of urination   . Short of breath on exertion   . Chronic constipation   . Glaucoma of both eyes   . Hearing loss of both ears     no hearing aids  . Dry eyes   . Pneumonia     PAST SURGICAL HISTORY: Past Surgical History  Procedure Laterality Date  . Loop recorder implant  02-08-13; 08-30-13    MDT LinQ implanted by Dr Lovena Le for syncope, explanted 08-30-13 after CHB identified  .  Total abdominal hysterectomy w/ bilateral salpingoophorectomy  03-15-2005  . Carpal tunnel release Right   . Eye surgery      cataract, bilat.  . Pacemaker insertion  08-30-2013    MDT ADDRL1 pacemaker implanted by Dr Rayann Heman for CHB    FAMILY HISTORY: Family History  Problem Relation Age of Onset  . Kidney disease Mother  SOCIAL HISTORY:  History   Social History  . Marital Status: Widowed    Spouse Name: N/A    Number of Children: 3  . Years of Education: N/A   Occupational History  . Not on file.   Social History Main Topics  . Smoking status: Never Smoker   . Smokeless tobacco: Never Used  . Alcohol Use: No  . Drug Use: No  . Sexual Activity: No   Other Topics Concern  . Not on file   Social History Narrative   lives with son, [place]; No tob, etoh.    Has three total children. 1 g'child.;    Dtr [name] (w) [phone], (h) [phone]; dtr [place]. Daughters usually interpret for her at visits and are very involved and ask lots of questions.      PHYSICAL EXAM   Filed Vitals:   10/03/13 1414  BP: 143/80  Pulse: 83  Height: '4\' 11"'  (1.499 m)  Weight: 127 lb (57.607 kg)    Not recorded    Body mass index is 25.64 kg/(m^2).   Generalized: In no acute distress  Neck: Supple, no carotid bruits   Cardiac: Regular rate rhythm  Pulmonary: Clear to auscultation bilaterally  Musculoskeletal: No deformity  Neurological examination  Mentation:  sitting wheelchair, dependent on her daughter providing history, Hispanic speaking only   Cranial nerve II-XII: Pupils were equal round reactive to light. Extraocular movements were full.  Visual field were full on confrontational test. Bilateral fundi were sharp.  Facial sensation and strength were normal. Hearing was intact to finger rubbing bilaterally. Uvula tongue midline.  Head turning and shoulder shrug and were normal and symmetric.Tongue protrusion into cheek strength was normal.  Motor: Normal tone,  bulk and strength, With exception of limited range of motion of right shoulder.  Sensory: Intact to fine touch, pinprick, preserved vibratory sensation, and proprioception at toes.  Coordination: Normal finger to nose, heel-to-shin bilaterally there was no truncal ataxia  Gait: need to push up from the sitting position, cautious, unsteady   Romberg signs: Negative  Deep tendon reflexes: Brachioradialis 2/2, biceps 2/2, triceps 2/2, patellar 2/2, Achilles trace , plantar responses were flexor bilaterally.   DIAGNOSTIC DATA (LABS, IMAGING, TESTING) - I reviewed patient records, labs, notes, testing and imaging myself where available.  Lab Results  Component Value Date   WBC 8.5 09/03/2013   HGB 11.4* 09/19/2013   HCT 32.6* 09/03/2013   MCV 97.0 09/03/2013   PLT 158 09/03/2013      Component Value Date/Time   NA 132* 09/19/2013 1428   K 4.3 09/19/2013 1428   CL 94* 09/19/2013 1428   CO2 31 09/19/2013 1428   GLUCOSE 90 09/19/2013 1428   BUN 18 09/19/2013 1428   CREATININE 1.09 09/19/2013 1428   CREATININE 1.02 09/04/2013 0552   CALCIUM 9.5 09/19/2013 1428   PROT 6.0 08/30/2013 0504   ALBUMIN 2.7* 08/30/2013 0504   AST 43* 08/30/2013 0504   ALT 93* 08/30/2013 0504   ALKPHOS 101 08/30/2013 0504   BILITOT 0.4 08/30/2013 0504   GFRNONAA 46* 09/04/2013 0552   GFRAA 54* 09/04/2013 0552   Lab Results  Component Value Date   CHOL 240* 08/28/2012   HDL 61 08/28/2012   LDLCALC 133* 08/28/2012   TRIG 230* 08/28/2012   CHOLHDL 3.9 08/28/2012   Lab Results  Component Value Date   HGBA1C  Value: 5.6 (NOTE)  According to the ADA Clinical Practice Recommendations for 2011, when HbA1c is used as a screening test:   >=6.5%   Diagnostic of Diabetes Mellitus           (if abnormal result  is confirmed)  5.7-6.4%   Increased risk of developing Diabetes Mellitus  References:Diagnosis and Classification of Diabetes Mellitus,Diabetes Care,2011,34(Suppl  1):S62-S69 and Standards of Medical Care in         Diabetes - 2011,Diabetes WTKT,8288,33  (Suppl 1):S11-S61. 07/01/2010   Lab Results  Component Value Date   VITAMINB12 613 11/16/2010   Lab Results  Component Value Date   TSH 5.560* 08/30/2013    ASSESSMENT AND PLAN  CADENCE MINTON is a 80 y.o. female complains of  Right shoulder pain, few month history of constant headaches, previous CAT scan of the brain has demonstrates stable left supraclinoid calcified internal carotid artery aneurysm, periventricular small vessel disease   1. Her headache the most consistent with tension headaches, 2 ESR C. reactive protein to rule out temporal arteritis 3. Gabapentin 100 mg 3 times a day, Percocet as needed 4. Return to clinic in 2 months  Marcial Pacas, M.D. Ph.D.  [facility] [address] [place], [place] [address] [phone]

## 2013-10-03 NOTE — Telephone Encounter (Signed)
Spoke with marylou and she is fine with the referral.  Would still like to see Dr. Noreene Filbert office and states that they have audiology there.  [name],CMA

## 2013-10-03 NOTE — Telephone Encounter (Signed)
LM for daughter Stasia Cavalier to call back.  Please give message from MD.  Thanks Johnney Ou

## 2013-10-04 ENCOUNTER — Telehealth: Payer: Self-pay | Admitting: Neurology

## 2013-10-04 DIAGNOSIS — I129 Hypertensive chronic kidney disease with stage 1 through stage 4 chronic kidney disease, or unspecified chronic kidney disease: Secondary | ICD-10-CM | POA: Diagnosis not present

## 2013-10-04 DIAGNOSIS — Z950 Presence of cardiac pacemaker: Secondary | ICD-10-CM | POA: Diagnosis not present

## 2013-10-04 DIAGNOSIS — J449 Chronic obstructive pulmonary disease, unspecified: Secondary | ICD-10-CM | POA: Diagnosis not present

## 2013-10-04 DIAGNOSIS — N183 Chronic kidney disease, stage 3 unspecified: Secondary | ICD-10-CM | POA: Diagnosis not present

## 2013-10-04 DIAGNOSIS — I5031 Acute diastolic (congestive) heart failure: Secondary | ICD-10-CM | POA: Diagnosis not present

## 2013-10-04 DIAGNOSIS — I509 Heart failure, unspecified: Secondary | ICD-10-CM | POA: Diagnosis not present

## 2013-10-04 LAB — SEDIMENTATION RATE: Sed Rate: 30 mm/hr (ref 0–40)

## 2013-10-04 LAB — C-REACTIVE PROTEIN: CRP: 12 mg/L — ABNORMAL HIGH (ref 0.0–4.9)

## 2013-10-04 NOTE — Telephone Encounter (Signed)
Fail to reach patient, left message lab showed elevated ESR, CRP, if she continue to have HA, may call in low dose prednisone in Monday

## 2013-10-04 NOTE — Telephone Encounter (Signed)
Referral placed. Please inform patients daughter.

## 2013-10-07 ENCOUNTER — Other Ambulatory Visit: Payer: Self-pay | Admitting: Family Medicine

## 2013-10-07 NOTE — Telephone Encounter (Signed)
Spoke to daughter went over Dr. Rhea Belton previous note. Daughter says she would like to have low dose Prednisone called in for pt. She also would like to know if patient should be taking the meloxicam (MOBIC) 7.5 MG tablet  as prescribed due to kidney level increase as mentioned in office visit. Please advise.

## 2013-10-07 NOTE — Telephone Encounter (Signed)
Calling to discuss the lab results.  Please call.  [phone]

## 2013-10-08 ENCOUNTER — Other Ambulatory Visit: Payer: Self-pay | Admitting: Family Medicine

## 2013-10-08 DIAGNOSIS — J449 Chronic obstructive pulmonary disease, unspecified: Secondary | ICD-10-CM | POA: Diagnosis not present

## 2013-10-08 DIAGNOSIS — N183 Chronic kidney disease, stage 3 unspecified: Secondary | ICD-10-CM | POA: Diagnosis not present

## 2013-10-08 DIAGNOSIS — Z950 Presence of cardiac pacemaker: Secondary | ICD-10-CM | POA: Diagnosis not present

## 2013-10-08 DIAGNOSIS — I509 Heart failure, unspecified: Secondary | ICD-10-CM | POA: Diagnosis not present

## 2013-10-08 DIAGNOSIS — I5031 Acute diastolic (congestive) heart failure: Secondary | ICD-10-CM | POA: Diagnosis not present

## 2013-10-08 DIAGNOSIS — I129 Hypertensive chronic kidney disease with stage 1 through stage 4 chronic kidney disease, or unspecified chronic kidney disease: Secondary | ICD-10-CM | POA: Diagnosis not present

## 2013-10-08 MED ORDER — PREDNISONE 5 MG PO TABS: ORAL_TABLET | ORAL | Status: DC

## 2013-10-08 NOTE — Telephone Encounter (Signed)
I have called her son and her daughter, Stanton Kidney, patient still complains of moderate daily ha, lab showed elevated cpr 12, sed 48,   I will call in prednisone 5mg  one tab po qam after meal, xone week, and then 1/2 tabs po qam. Try to avoid NSAIDs.  Keep follow up in July.

## 2013-10-09 DIAGNOSIS — I129 Hypertensive chronic kidney disease with stage 1 through stage 4 chronic kidney disease, or unspecified chronic kidney disease: Secondary | ICD-10-CM | POA: Diagnosis not present

## 2013-10-09 DIAGNOSIS — I5031 Acute diastolic (congestive) heart failure: Secondary | ICD-10-CM | POA: Diagnosis not present

## 2013-10-09 DIAGNOSIS — J449 Chronic obstructive pulmonary disease, unspecified: Secondary | ICD-10-CM | POA: Diagnosis not present

## 2013-10-09 DIAGNOSIS — I509 Heart failure, unspecified: Secondary | ICD-10-CM | POA: Diagnosis not present

## 2013-10-09 DIAGNOSIS — N183 Chronic kidney disease, stage 3 unspecified: Secondary | ICD-10-CM | POA: Diagnosis not present

## 2013-10-09 DIAGNOSIS — Z950 Presence of cardiac pacemaker: Secondary | ICD-10-CM | POA: Diagnosis not present

## 2013-10-10 DIAGNOSIS — N183 Chronic kidney disease, stage 3 unspecified: Secondary | ICD-10-CM | POA: Diagnosis not present

## 2013-10-10 DIAGNOSIS — J449 Chronic obstructive pulmonary disease, unspecified: Secondary | ICD-10-CM | POA: Diagnosis not present

## 2013-10-10 DIAGNOSIS — Z950 Presence of cardiac pacemaker: Secondary | ICD-10-CM | POA: Diagnosis not present

## 2013-10-10 DIAGNOSIS — I5031 Acute diastolic (congestive) heart failure: Secondary | ICD-10-CM | POA: Diagnosis not present

## 2013-10-10 DIAGNOSIS — I129 Hypertensive chronic kidney disease with stage 1 through stage 4 chronic kidney disease, or unspecified chronic kidney disease: Secondary | ICD-10-CM | POA: Diagnosis not present

## 2013-10-10 DIAGNOSIS — I509 Heart failure, unspecified: Secondary | ICD-10-CM | POA: Diagnosis not present

## 2013-10-11 ENCOUNTER — Telehealth: Payer: Self-pay | Admitting: Internal Medicine

## 2013-10-11 NOTE — Telephone Encounter (Signed)
Discussed with Dr Rayann Heman okay to take the medication. Left message for daughter on her voicemail

## 2013-10-11 NOTE — Telephone Encounter (Signed)
New message     Question about prednisone 5mg  presc by Dr Angelica Ran. She was diagnosed with temporal arteritis.  Will it be ok taking the prednisone with her heart health and with her heart medications?  Daughter will be unavailable after 3pm

## 2013-10-12 ENCOUNTER — Other Ambulatory Visit: Payer: Self-pay | Admitting: Family Medicine

## 2013-10-14 ENCOUNTER — Other Ambulatory Visit: Payer: Self-pay | Admitting: *Deleted

## 2013-10-14 MED ORDER — SENNOSIDES-DOCUSATE SODIUM 8.6-50 MG PO TABS: ORAL_TABLET | ORAL | Status: DC

## 2013-10-15 ENCOUNTER — Ambulatory Visit (INDEPENDENT_AMBULATORY_CARE_PROVIDER_SITE_OTHER): Payer: Medicare Other | Admitting: Family Medicine

## 2013-10-15 ENCOUNTER — Encounter: Payer: Self-pay | Admitting: Family Medicine

## 2013-10-15 VITALS — BP 124/60 | HR 85 | Temp 98.6°F | Ht 59.0 in | Wt 128.0 lb

## 2013-10-15 DIAGNOSIS — M25551 Pain in right hip: Secondary | ICD-10-CM

## 2013-10-15 DIAGNOSIS — K5900 Constipation, unspecified: Secondary | ICD-10-CM | POA: Diagnosis not present

## 2013-10-15 DIAGNOSIS — R634 Abnormal weight loss: Secondary | ICD-10-CM | POA: Diagnosis not present

## 2013-10-15 DIAGNOSIS — I10 Essential (primary) hypertension: Secondary | ICD-10-CM | POA: Diagnosis not present

## 2013-10-15 DIAGNOSIS — M25559 Pain in unspecified hip: Secondary | ICD-10-CM

## 2013-10-15 LAB — BASIC METABOLIC PANEL
BUN: 29 mg/dL — AB (ref 6–23)
CO2: 30 mEq/L (ref 19–32)
CREATININE: 1.09 mg/dL (ref 0.50–1.10)
Calcium: 9.2 mg/dL (ref 8.4–10.5)
Chloride: 101 mEq/L (ref 96–112)
GLUCOSE: 119 mg/dL — AB (ref 70–99)
Potassium: 4.2 mEq/L (ref 3.5–5.3)
Sodium: 135 mEq/L (ref 135–145)

## 2013-10-15 MED ORDER — GABAPENTIN 100 MG PO CAPS: 200.0000 mg | ORAL_CAPSULE | Freq: Three times a day (TID) | ORAL | Status: DC

## 2013-10-15 MED ORDER — MIRTAZAPINE 7.5 MG PO TABS: 7.5000 mg | ORAL_TABLET | Freq: Every day | ORAL | Status: DC

## 2013-10-15 NOTE — Patient Instructions (Addendum)
Nice to see you. For her pain please do the following. If her pain is not better with the first step in the list please move to the next step.  Tylenol 650 mg every 8 hours daily.  Gabapentin 200 mg three times a day.  Ibuprofen 400 mg every 8 hours as needed.   Percocet every 8 hours as needed. Please try to limit the use of this.  Please start taking the remeron each night. This should help with your appetite. You should also try to incorporate ensure shakes in to your diet and let her eat some high calorie foods. I will see you back in one month for follow-up of these issues.

## 2013-10-16 ENCOUNTER — Encounter: Payer: Self-pay | Admitting: Family Medicine

## 2013-10-16 ENCOUNTER — Telehealth: Payer: Self-pay | Admitting: Family Medicine

## 2013-10-16 ENCOUNTER — Telehealth: Payer: Self-pay | Admitting: *Deleted

## 2013-10-16 DIAGNOSIS — I129 Hypertensive chronic kidney disease with stage 1 through stage 4 chronic kidney disease, or unspecified chronic kidney disease: Secondary | ICD-10-CM | POA: Diagnosis not present

## 2013-10-16 DIAGNOSIS — N183 Chronic kidney disease, stage 3 unspecified: Secondary | ICD-10-CM | POA: Diagnosis not present

## 2013-10-16 DIAGNOSIS — I509 Heart failure, unspecified: Secondary | ICD-10-CM | POA: Diagnosis not present

## 2013-10-16 DIAGNOSIS — I5031 Acute diastolic (congestive) heart failure: Secondary | ICD-10-CM | POA: Diagnosis not present

## 2013-10-16 DIAGNOSIS — Z950 Presence of cardiac pacemaker: Secondary | ICD-10-CM | POA: Diagnosis not present

## 2013-10-16 DIAGNOSIS — J449 Chronic obstructive pulmonary disease, unspecified: Secondary | ICD-10-CM | POA: Diagnosis not present

## 2013-10-16 MED ORDER — GABAPENTIN 100 MG PO CAPS: 200.0000 mg | ORAL_CAPSULE | Freq: Three times a day (TID) | ORAL | Status: DC

## 2013-10-16 NOTE — Assessment & Plan Note (Addendum)
Patient with pain in right hip and low back. Pain worse with internal rotation of hip. Potentially could be related to OA vs degenerative changes in back vs known spinal stenosis. Has previously seen ortho and decision was to not do surgery. I do not think at this point given all the medication interventions we will be able to get her pain to go away, though we can try to get her under better control. Pain regimen is as follows: Tylenol 650 mg every 8 hours daily. Gabapentin 200 mg three times a day. Ibuprofen 400 mg every 8 hours as needed.  Percocet every 8 hours as needed. Please try to limit the use of this.   Will see back in one month.

## 2013-10-16 NOTE — Telephone Encounter (Signed)
Heather, RN with [facility] called stating pt has crackles anterior left lung with deep breathing.  It didn't clear with coughing.  O2 stats range from 88-92%.  Pt denies any SOB or chest pain.  Pt alert and oriented x 3.  Pt is resting in bed due to increase pain.  Family was instructed on pain medication and to monitor Oxygen level/ when to call the doctor.  Call [phone] with questions.  Will forward to PCP.  Derl Barrow, RN

## 2013-10-16 NOTE — Telephone Encounter (Signed)
Need to discuss changes on medication made yesterday.  Please call daughter back asap to advise.

## 2013-10-16 NOTE — Assessment & Plan Note (Signed)
This is improved. Will continue with current regimen. If stays regular will be able to come off docusate.

## 2013-10-16 NOTE — Assessment & Plan Note (Signed)
Patient with loss of weight and diminished appetite over the past 2 months since coming off of her remeron. Suspect that the remeron was beneficial for her appetite and will restart this. She denies rectal bleeding so doubt colon cancer, though she declined colonoscopy previously. Discussed adding ensure to daily diet and letting the patient eat what ever she wants, specifically high fat foods. If weight does not increase with this, will need to consider cancer screening options, though will need to discuss the risks and benefits of this given the patients age with the family.

## 2013-10-16 NOTE — Progress Notes (Signed)
Patient ID: Jordan [name], female   DOB: [birth year], 80 y.o.   MRN: [id number]  Jordan Rumps, MD Phone: [phone]  Jordan [name] is a 80 y.o. female who presents today for f/u.  Weight loss: son notes that the patient has been losing weight over the past several months. She has had a decrease in her appetite even though they are cooking what she likes. She is not eating much. Jordan [name] states she does not eat high fat foods. She likes salads and chicken. Denies blood in her stools. She has tried ensure for this, though does not like this.   Constipation: has improved with docusate and miralax. She now has one BM daily. No abdominal pain.  Right low back/hip pain: notes that this pain has been getting worse. Has been present for over a year, though worsened in the last 7 days. Hurts in right hip and buttock. Has had imaging of right hip in the last year with only mild arthritis. Has had imaging of her lumbar spine in the last year with compression fractures seen and spinal stenosis seen as well.  Has previously seen ortho and son states ortho and the family decided that surgical intervention would not be a good option for the patient. She denies weakness, issues with BM and urination. She has previously been treated with mobic, baclofen, percocet, tramadol with minimal to no benefit. She has been on lidoderm patches previously with benefit. She notes now that percocet has helped recently. She was additionally started on gabapentin by her neurologist.   Patient is a nonsmoker.   ROS: Per HPI   Physical Exam Filed Vitals:   10/15/13 1410  BP: 124/60  Pulse: 85  Temp: 98.6 F (37 C)   Gen: Well NAD HEENT: PERRL,  MMM Lungs: CTABL Nl WOB Heart: RRR no MRG Abd: NABS, NT, ND MSK: no erythema or swelling of back or right hip, no tenderness to palpation of back or hip, there was pain with internal rotation of the right hip and mild decrease in internal ROM Neuro: 5/5 strength in bilateral hip  flexors, quads, hamstrings, plantar and dorsiflexion, sensation to light touch intact bilaterally LE, absent patellar reflexes bilaterally Exts: Non edematous BL  LE, warm and well perfused.   Assessment/Plan: Please see individual problem list.  # Healthcare maintenance: up to date

## 2013-10-16 NOTE — Telephone Encounter (Signed)
Daughter called with questions regarding the patients medication regimen. I advised that the patient should be taking both remeron and trazodone. The addition of remeron is for her appetite and not for her sleep. I discussed the pain medication regimen as outline in yesterdays AVS. I advised that the patient should take scheduled tylenol and gabapentin. If this does not help she can take ibuprofen. And if that does not help she could take percocet. She should not take the tramadol at this time. Daughter expressed understanding of this.

## 2013-10-17 ENCOUNTER — Telehealth: Payer: Self-pay | Admitting: Family Medicine

## 2013-10-17 DIAGNOSIS — N183 Chronic kidney disease, stage 3 unspecified: Secondary | ICD-10-CM | POA: Diagnosis not present

## 2013-10-17 DIAGNOSIS — I129 Hypertensive chronic kidney disease with stage 1 through stage 4 chronic kidney disease, or unspecified chronic kidney disease: Secondary | ICD-10-CM | POA: Diagnosis not present

## 2013-10-17 DIAGNOSIS — I5031 Acute diastolic (congestive) heart failure: Secondary | ICD-10-CM | POA: Diagnosis not present

## 2013-10-17 DIAGNOSIS — M25551 Pain in right hip: Secondary | ICD-10-CM

## 2013-10-17 DIAGNOSIS — I509 Heart failure, unspecified: Secondary | ICD-10-CM | POA: Diagnosis not present

## 2013-10-17 DIAGNOSIS — J449 Chronic obstructive pulmonary disease, unspecified: Secondary | ICD-10-CM | POA: Diagnosis not present

## 2013-10-17 DIAGNOSIS — Z950 Presence of cardiac pacemaker: Secondary | ICD-10-CM | POA: Diagnosis not present

## 2013-10-17 NOTE — Telephone Encounter (Signed)
Jordan [name]'s daughter calling to ask that provider order extended visits for in home physical therapy.  Therapist's last day is tomorrow, but would like to have more given for her mom.    Please fax [facility] to [facility].

## 2013-10-18 ENCOUNTER — Telehealth: Payer: Self-pay | Admitting: Internal Medicine

## 2013-10-18 NOTE — Telephone Encounter (Signed)
Faxed order to [facility]. [name],CMA

## 2013-10-18 NOTE — Telephone Encounter (Signed)
New message    Daughter calling    Jordan [name] to bathroom this am.   Weight gain 125.8 today  128.0

## 2013-10-18 NOTE — Telephone Encounter (Signed)
Spoke with daughter, she was under the impression that she was to call if her mother's weight went up 1 pound in a day.  I explained to her that it was most likely 3 pounds in a day.  She is up 2 pounds and she is worried.  She does not have any swelling and no SOB.  I advised her to continue to weigh and if she continues to gain or becomes symptomatic then she should call us back.  She verbalized understanding and will call if her weight continues to increase over the weekend or she notices swelling or increased SOB

## 2013-10-18 NOTE — Telephone Encounter (Signed)
New order placed for home health PT to continue.

## 2013-10-21 ENCOUNTER — Telehealth: Payer: Self-pay | Admitting: Family Medicine

## 2013-10-21 DIAGNOSIS — H4011X0 Primary open-angle glaucoma, stage unspecified: Secondary | ICD-10-CM | POA: Diagnosis not present

## 2013-10-21 DIAGNOSIS — H26499 Other secondary cataract, unspecified eye: Secondary | ICD-10-CM | POA: Diagnosis not present

## 2013-10-21 DIAGNOSIS — H43399 Other vitreous opacities, unspecified eye: Secondary | ICD-10-CM | POA: Diagnosis not present

## 2013-10-21 NOTE — Telephone Encounter (Signed)
Mary lou called and would like a copy of her mother's last doctor visit notes and also a medication list. She is try to get her mother some help help through other agencies. If possible she would like this faxed today at [phone] and if it can not be faxed today leave it up front for her to pickup. jw

## 2013-10-21 NOTE — Telephone Encounter (Signed)
Last office note faxed along with a separate medication list. [name],CMA

## 2013-10-23 DIAGNOSIS — I509 Heart failure, unspecified: Secondary | ICD-10-CM | POA: Diagnosis not present

## 2013-10-23 DIAGNOSIS — Z950 Presence of cardiac pacemaker: Secondary | ICD-10-CM | POA: Diagnosis not present

## 2013-10-23 DIAGNOSIS — I5031 Acute diastolic (congestive) heart failure: Secondary | ICD-10-CM | POA: Diagnosis not present

## 2013-10-23 DIAGNOSIS — J449 Chronic obstructive pulmonary disease, unspecified: Secondary | ICD-10-CM | POA: Diagnosis not present

## 2013-10-23 DIAGNOSIS — N183 Chronic kidney disease, stage 3 unspecified: Secondary | ICD-10-CM | POA: Diagnosis not present

## 2013-10-23 DIAGNOSIS — I129 Hypertensive chronic kidney disease with stage 1 through stage 4 chronic kidney disease, or unspecified chronic kidney disease: Secondary | ICD-10-CM | POA: Diagnosis not present

## 2013-10-25 ENCOUNTER — Telehealth: Payer: Self-pay | Admitting: *Deleted

## 2013-10-25 NOTE — Telephone Encounter (Signed)
LMOVM of Jordan [name].  Please inform of the below appt:  [id number] @ 2pm [facility] [id number] [address] [place], [place] [address] [phone] phone   [name], [name]

## 2013-10-30 ENCOUNTER — Telehealth: Payer: Self-pay | Admitting: *Deleted

## 2013-10-30 DIAGNOSIS — I509 Heart failure, unspecified: Secondary | ICD-10-CM | POA: Diagnosis not present

## 2013-10-30 DIAGNOSIS — I5031 Acute diastolic (congestive) heart failure: Secondary | ICD-10-CM | POA: Diagnosis not present

## 2013-10-30 DIAGNOSIS — I129 Hypertensive chronic kidney disease with stage 1 through stage 4 chronic kidney disease, or unspecified chronic kidney disease: Secondary | ICD-10-CM | POA: Diagnosis not present

## 2013-10-30 DIAGNOSIS — N183 Chronic kidney disease, stage 3 unspecified: Secondary | ICD-10-CM | POA: Diagnosis not present

## 2013-10-30 DIAGNOSIS — Z950 Presence of cardiac pacemaker: Secondary | ICD-10-CM | POA: Diagnosis not present

## 2013-10-30 DIAGNOSIS — J449 Chronic obstructive pulmonary disease, unspecified: Secondary | ICD-10-CM | POA: Diagnosis not present

## 2013-10-30 NOTE — Telephone Encounter (Signed)
Nira Conn, RN from [facility] called just to inform PCP that pt has fine crackles back lower lobes and Rt chest.  Pt denies any chest pain or SOB at this time.  Pt maybe discharged from [facility] the week of July 17th, 2015; pt is meeting her goal and stable per [facility].  If you have questions please give her a call at [phone].  Derl Barrow, RN

## 2013-11-04 ENCOUNTER — Other Ambulatory Visit: Payer: Self-pay | Admitting: *Deleted

## 2013-11-04 MED ORDER — POLYETHYLENE GLYCOL 3350 17 G PO PACK: 17.0000 g | PACK | Freq: Every day | ORAL | Status: DC

## 2013-11-06 DIAGNOSIS — M719 Bursopathy, unspecified: Secondary | ICD-10-CM | POA: Diagnosis not present

## 2013-11-06 DIAGNOSIS — M67919 Unspecified disorder of synovium and tendon, unspecified shoulder: Secondary | ICD-10-CM | POA: Diagnosis not present

## 2013-11-08 ENCOUNTER — Ambulatory Visit (INDEPENDENT_AMBULATORY_CARE_PROVIDER_SITE_OTHER): Payer: Medicare Other | Admitting: Neurology

## 2013-11-08 VITALS — BP 137/61 | HR 71 | Ht 59.0 in | Wt 127.0 lb

## 2013-11-08 DIAGNOSIS — M353 Polymyalgia rheumatica: Secondary | ICD-10-CM

## 2013-11-08 DIAGNOSIS — I6350 Cerebral infarction due to unspecified occlusion or stenosis of unspecified cerebral artery: Secondary | ICD-10-CM | POA: Diagnosis not present

## 2013-11-08 DIAGNOSIS — R52 Pain, unspecified: Secondary | ICD-10-CM | POA: Diagnosis not present

## 2013-11-08 HISTORY — DX: Polymyalgia rheumatica: M35.3

## 2013-11-08 MED ORDER — PREDNISONE 2.5 MG PO TABS: ORAL_TABLET | ORAL | Status: DC

## 2013-11-08 MED ORDER — DULOXETINE HCL 20 MG PO CPEP: 20.0000 mg | ORAL_CAPSULE | Freq: Every day | ORAL | Status: DC

## 2013-11-08 NOTE — Progress Notes (Signed)
PATIENT: Jordan [name] DOB: 1919-08-01  HISTORICAL  Jordan [name] is a 80 yo female native of [place],, is referred by her primary care physician Dr. Ron Agee, accompanied by her daughter Jordan [name] for evluation of headache and nausea. She only speaks Spanish, her daughter Jordan [name] translated for her, who is also her power of attorney,  She was previously quite healthy, in October 2014, she had a transient loss of consciousness, had Loop recorder placement  She had a right shoulder rotator cuff, planning on to have arthroscopic surgery in May first 2015, during pre-surgical clearance, she was found to have elevated blood pressure, systolic up to 937D, also bradycardia, heart rate 30 and 40,  She complains of excessive fatigue, with persistent bradycardia, she was found to have symptomatic mobitz II second degree AV block and syncope  In May first 2015, she underwent. Pacemaker implantation. Implantable loop recorder removal by cardiologist Dr. Rayann Heman.  She denies a previous history of headaches, but over the past 3 months, since February 2015, she has been complaining of frequent holoacranial pressure headaches, pressure sensation, along her spine, no significant visual loss, gait difficulty due to low back pain, joints pain, right shoulder pain, she also complains of excessive fatigue, much less active  We have reviewed,  CAT scan of the brain without contrast in October 2014: Atrophy and chronic small-vessel white matter ischemic changes are  again noted. A left supraclinoid internal carotid artery aneurysm appears unchanged. No acute intracranial abnormalities are identified  She no longer taking long-term NSAIDs because chronic [name] disease,  UPDATE July 10th 2015: I have called her her for crp 12, ESR was normal 30, I called in prednisone 51m, x one week, then 1/2 tab xone week, she reported mild improvement. She is also taking gabapentin 1059mtid, complains of drowsiness, not sure that it was  helpful.  She still feels heaviness of her neck, fatigue,  Tired, multiple joints pain.   REVIEW OF SYSTEMS: Full 14 system review of systems performed and notable only for fatigue, appetite change, hearing loss, excessive thirst, diarrhea, headaches,weakness, back pain, joints pain, neck pain, stiffness.  ALLERGIES: Allergies  Allergen Reactions  . Amlodipine Other (See Comments)    Peripheral edema with 2.59m4maily dose  . Chlorthalidone Other (See Comments)    Hyponatremia  . Verapamil Other (See Comments)    Bradycardia - HR  35-45 on 62m42mD  . Adhesive [Tape] Rash and Other (See Comments)    Skin irritation  . Honey Bee Treatment [Bee Venom] Rash    Rash     HOME MEDICATIONS: Current Outpatient Prescriptions on File Prior to Visit  Medication Sig Dispense Refill  . acetaminophen (TYLENOL) 325 MG tablet Take 650 mg by mouth every 6 (six) hours as needed.      . alMarland Kitchenopurinol (ZYLOPRIM) 100 MG tablet Take 1 tablet (100 mg total) by mouth daily.  30 tablet  12  . aspirin 81 MG tablet Take 81 mg by mouth daily.      . baclofen (LIORESAL) 10 MG tablet Take 0.5 tablets (5 mg total) by mouth 3 (three) times daily as needed for muscle spasms.  30 each  0  . Calcium Carbonate-Vitamin D (CALTRATE 600+D) 600-400 MG-UNIT per tablet Take 1 tablet by mouth 2 (two) times daily.        . cloNIDine (CATAPRES) 0.1 MG tablet Take 1 tablet (0.1 mg total) by mouth 2 (two) times daily.  60 tablet  1  . colchicine (COLCRYS)  0.6 MG tablet Take 1 tablet (0.6 mg total) by mouth 2 (two) times daily.  60 tablet  3  . docusate sodium (COLACE) 100 MG capsule Take 1 capsule (100 mg total) by mouth 2 (two) times daily.  60 capsule  0  . furosemide (LASIX) 20 MG tablet Take 1 tablet (20 mg total) by mouth daily.  30 tablet  12  . Glucosamine-Chondroitin-Vit D3 1500-1200-800 MG-MG-UNIT PACK Take 1 tablet by mouth 2 (two) times daily.        Marland Kitchen levothyroxine (SYNTHROID, LEVOTHROID) 125 MCG tablet take 1 tablet  by mouth once daily--  takes in am  30 tablet  3  . lidocaine (LIDODERM) 5 % APPLY 1/4 PATCH TO SKIN DAILY.  30 patch  0  . ondansetron (ZOFRAN) 4 MG tablet nausea      . polyethylene glycol (MIRALAX) packet Take 17 g by mouth daily.  14 each  0  . Prenatal Vit-Fe Fumarate-FA (PREPLUS) 27-1 MG TABS TAKE ONE TABLET BY MOUTH ONCE DAILY  90 tablet  0  . RESTASIS 0.05 % ophthalmic emulsion Place 1 drop into both eyes daily.       Marland Kitchen senna-docusate (SENOKOT-S) 8.6-50 MG per tablet Take 1 tablet by mouth daily. For 2 days.  10 tablet  0  . traMADol (ULTRAM) 50 MG tablet Take 50 mg by mouth 2 (two) times daily.      . TRAVATAN Z 0.004 % SOLN ophthalmic solution Place 1 drop into both eyes at bedtime.       . traZODone (DESYREL) 50 MG tablet Take 50 mg by mouth at bedtime as needed for sleep.      . trospium (SANCTURA) 20 MG tablet Take 20 mg by mouth 2 (two) times daily.       . VOLTAREN 1 % GEL Cream for pain         PAST MEDICAL HISTORY: Past Medical History  Diagnosis Date  . Cervicalgia   . Peripheral neuropathy   . Hypertension   . Osteoporosis   . Insomnia   . Arthritis   . Headache   . Hypothyroidism   . Right rotator cuff tear   . OA (osteoarthritis)     RIGHT SHOULDER AC JOINT  . History of syncope     01/2013  DX  CYRPTOGENIC STROKE  . Moderate dementia   . Gait instability   . First degree heart block   . History of colon polyps   . Diverticulosis of colon   . SUI (stress urinary incontinence, female)   . Lumbar stenosis   . Status post placement of implantable loop recorder     02-08-2013  . Cryptogenic stroke S/P  LOOP RECORDER IMPLANT    DX  01/2013  --  SYNCOPAL EPISODE --  EPISODIC  ATRIAL TACHY/ FIBULATION  AND PAUSES  . Frequency of urination   . Short of breath on exertion   . Chronic constipation   . Glaucoma of both eyes   . Hearing loss of both ears     no hearing aids  . Dry eyes   . Pneumonia     PAST SURGICAL HISTORY: Past Surgical History    Procedure Laterality Date  . Loop recorder implant  02-08-13; 08-30-13    MDT LinQ implanted by Dr Lovena Le for syncope, explanted 08-30-13 after CHB identified  . Total abdominal hysterectomy w/ bilateral salpingoophorectomy  03-15-2005  . Carpal tunnel release Right   . Eye surgery  cataract, bilat.  . Pacemaker insertion  08-30-2013    MDT ADDRL1 pacemaker implanted by Dr Rayann Heman for CHB    FAMILY HISTORY: Family History  Problem Relation Age of Onset  . [name] disease Mother     SOCIAL HISTORY:  History   Social History  . Marital Status: Widowed    Spouse Name: N/A    Number of Children: 3  . Years of Education: N/A   Occupational History  . Not on file.   Social History Main Topics  . Smoking status: Never Smoker   . Smokeless tobacco: Never Used  . Alcohol Use: No  . Drug Use: No  . Sexual Activity: No   Other Topics Concern  . Not on file   Social History Narrative   lives with son, [place]; No tob, etoh.    Has three total children. 1 g'child.;    Dtr Jordan [name] (w) [phone], (h) [phone]; dtr Jordan [name]. Daughters usually interpret for her at visits and are very involved and ask lots of questions.      PHYSICAL EXAM   There were no vitals filed for this visit.  Not recorded    There is no weight on file to calculate BMI.   Generalized: In no acute distress  Neck: Supple, no carotid bruits   Cardiac: Regular rate rhythm  Pulmonary: Clear to auscultation bilaterally  Musculoskeletal: No deformity  Neurological examination  Mentation:  dependent on her daughter providing history, Hispanic speaking only   Cranial nerve II-XII: Pupils were equal round reactive to light. Extraocular movements were full.  Visual field were full on confrontational test. Bilateral fundi were sharp.  Facial sensation and strength were normal. Hearing was intact to finger rubbing bilaterally. Uvula tongue midline.  Head turning and shoulder shrug and were normal and  symmetric.Tongue protrusion into cheek strength was normal.  Motor: Normal tone, bulk and strength, With exception of limited range of motion of right shoulder, multiple tender spots upon deep palpitation at her neck and shoulder muscles.  Sensory: Intact to fine touch, pinprick   Coordination: Normal finger to nose, heel-to-shin bilaterally there was no truncal ataxia  Gait: need to push up from the sitting position, cautious, unsteady, atalgic  Romberg signs: Negative  Deep tendon reflexes: Brachioradialis 2/2, biceps 2/2, triceps 2/2, patellar 2/2, Achilles trace , plantar responses were flexor bilaterally.   DIAGNOSTIC DATA (LABS, IMAGING, TESTING) - I reviewed patient records, labs, notes, testing and imaging myself where available.  Lab Results  Component Value Date   WBC 8.5 09/03/2013   HGB 11.4* 09/19/2013   HCT 32.6* 09/03/2013   MCV 97.0 09/03/2013   PLT 158 09/03/2013      Component Value Date/Time   NA 135 10/15/2013 1506   K 4.2 10/15/2013 1506   CL 101 10/15/2013 1506   CO2 30 10/15/2013 1506   GLUCOSE 119* 10/15/2013 1506   BUN 29* 10/15/2013 1506   CREATININE 1.09 10/15/2013 1506   CREATININE 1.02 09/04/2013 0552   CALCIUM 9.2 10/15/2013 1506   PROT 6.0 08/30/2013 0504   ALBUMIN 2.7* 08/30/2013 0504   AST 43* 08/30/2013 0504   ALT 93* 08/30/2013 0504   ALKPHOS 101 08/30/2013 0504   BILITOT 0.4 08/30/2013 0504   GFRNONAA 46* 09/04/2013 0552   GFRAA 54* 09/04/2013 0552   Lab Results  Component Value Date   CHOL 240* 08/28/2012   HDL 61 08/28/2012   LDLCALC 133* 08/28/2012   TRIG 230* 08/28/2012   CHOLHDL 3.9 08/28/2012  Lab Results  Component Value Date   HGBA1C  Value: 5.6 (NOTE)                                                                       According to the ADA Clinical Practice Recommendations for 2011, when HbA1c is used as a screening test:   >=6.5%   Diagnostic of Diabetes Mellitus           (if abnormal result  is confirmed)  5.7-6.4%   Increased risk of developing  Diabetes Mellitus  References:Diagnosis and Classification of Diabetes Mellitus,Diabetes Care,2011,34(Suppl 1):S62-S69 and Standards of Medical Care in         Diabetes - 2011,Diabetes XAQW,3868,54  (Suppl 1):S11-S61. 07/01/2010   Lab Results  Component Value Date   ISNGXEXP97 331 11/16/2010   Lab Results  Component Value Date   TSH 5.560* 08/30/2013    ASSESSMENT AND PLAN  CHARNEL GILES is a 80 y.o. female complains of  rght shoulder pain, few month history of constant headaches, bilateral shoulder pain, previous CAT scan of the brain has demonstrates stable left supraclinoid calcified internal carotid artery aneurysm, periventricular small vessel disease   1. Her headache the most consistent with tension headaches, 2, mild elavated ESR, CRP, could not rule out possibility of polymylagia rheumatica, repeat Lab today 3. Try low dose prednisone 2.62m 2 tabs one week, then one tab po qam, if ESR, CRP remain high, may even consider temporal artery biopsy or rheumatologist refer.   YMarcial Pacas M.D. Ph.D.  [facility] [address] [place] [phone]

## 2013-11-09 LAB — THYROID PANEL WITH TSH
FREE THYROXINE INDEX: 3.2 (ref 1.2–4.9)
T3 Uptake Ratio: 31 % (ref 24–39)
T4, Total: 10.3 ug/dL (ref 4.5–12.0)
TSH: 0.041 u[IU]/mL — AB (ref 0.450–4.500)

## 2013-11-09 LAB — SEDIMENTATION RATE: SED RATE: 40 mm/h (ref 0–40)

## 2013-11-09 LAB — CK: Total CK: 35 U/L (ref 24–173)

## 2013-11-09 LAB — C-REACTIVE PROTEIN: CRP: 2.9 mg/L (ref 0.0–4.9)

## 2013-11-11 ENCOUNTER — Telehealth: Payer: Self-pay | Admitting: Neurology

## 2013-11-11 NOTE — Telephone Encounter (Signed)
I have called her daughter, normally ESR, CRP. , she should only prednisone 2.5mg 2 tabs xone week, then one tab po qday xone week..  Low TSH, indicating over supplement, she should contact her pcp.   

## 2013-11-12 ENCOUNTER — Encounter: Payer: Self-pay | Admitting: Neurology

## 2013-11-12 ENCOUNTER — Ambulatory Visit (INDEPENDENT_AMBULATORY_CARE_PROVIDER_SITE_OTHER): Payer: Medicare Other | Admitting: Family Medicine

## 2013-11-12 ENCOUNTER — Encounter: Payer: Self-pay | Admitting: Family Medicine

## 2013-11-12 VITALS — BP 122/63 | HR 71 | Temp 97.9°F | Ht 59.0 in | Wt 130.6 lb

## 2013-11-12 DIAGNOSIS — M48061 Spinal stenosis, lumbar region without neurogenic claudication: Secondary | ICD-10-CM | POA: Diagnosis not present

## 2013-11-12 DIAGNOSIS — E039 Hypothyroidism, unspecified: Secondary | ICD-10-CM

## 2013-11-12 DIAGNOSIS — Z7382 Dual sensory impairment: Secondary | ICD-10-CM

## 2013-11-12 DIAGNOSIS — H9190 Unspecified hearing loss, unspecified ear: Secondary | ICD-10-CM

## 2013-11-12 DIAGNOSIS — H9193 Unspecified hearing loss, bilateral: Secondary | ICD-10-CM

## 2013-11-12 DIAGNOSIS — H547 Unspecified visual loss: Secondary | ICD-10-CM

## 2013-11-12 HISTORY — DX: Unspecified visual loss: H54.7

## 2013-11-12 MED ORDER — LIDOCAINE 5 % EX PTCH: 1.0000 | MEDICATED_PATCH | CUTANEOUS | Status: DC

## 2013-11-12 MED ORDER — LEVOTHYROXINE SODIUM 112 MCG PO TABS: ORAL_TABLET | ORAL | Status: DC

## 2013-11-12 NOTE — Assessment & Plan Note (Signed)
Patient with trouble hearing. Ear cleaned out by nursing with removal of cerumen. Patient reported improvement in hearing after this procedure. Patient has been referred to ENT, though daughter reports that the cost of evaluation for this issue would be too much. Will continue to monitor this. F/u prn.

## 2013-11-12 NOTE — Telephone Encounter (Signed)
Please call patient, we do not have cymbalta on file, it is OK not taking it, if it was not helping.

## 2013-11-12 NOTE — Patient Instructions (Addendum)
Nice to see you. We will continue your physical therapy.  I have written a prescription for lidoderm patches. I will fill out the prior authorization once it comes to [place]. Let me know if this does not help. Please let us know if the back pain gets worse, you develop weakness, or numbness.

## 2013-11-12 NOTE — Telephone Encounter (Signed)
I called back.  Relayed Dr Rhea Belton message regarding meds.  They verbalized understanding.     They are also asking if Dr Krista Blue can make the OV notes from [facility] available online for them to view. Please advise.  Thank you.

## 2013-11-12 NOTE — Progress Notes (Signed)
Patient ID: Jordan [name], female   DOB: Jun 10, 1919, 80 y.o.   MRN: [id number]  Jordan Rumps, MD Phone: [phone]  Jordan [name] is a 80 y.o. female who presents today for f/u.  Back pain: notes continued back pain. The regimen started at last visit has helped some though has not taken the pain away. She is currently taking tylenol 1000 mg TID. She is also taking gabapentin though she is intermittently skipping her night time dose. She is not taking any NSAIDs. She does take infrequent percocet. She has been doing home PT and feels that this has helped. She denies changes in strength, saddle anesthesia, issues with BM and issues with urination.  HYPOTHYROIDISM Disease Monitoring Weight changes: has gained weight since last visit  Skin Changes: no Palpitations: no   Heat/Cold intolerance: no  Medication Monitoring Compliance:  taking   Last TSH:   Lab Results  Component Value Date   TSH 0.041* 11/08/2013   Diminished hearing: patient feels like her ears are full of wax. Feels like they are blocked. She notes they have been popping. She endorses trouble hearing. Previously referral placed to ENT at daughters request, though they have not made this visit as ENT advised patient they would need to go to audiology and insurance would not pay for this. She denies tinnitus.  Patient is a nonsmoker.   ROS: Per HPI   Physical Exam Filed Vitals:   11/12/13 1620  BP: 122/63  Pulse: 71  Temp: 97.9 F (36.6 C)    Gen: Well NAD HEENT: PERRL,  MMM, right ear canal with cerumen build up, left TM normal Lungs: CTABL Nl WOB Heart: RRR no MRG MSK: back with no signs of swelling, there is no midline tenderness to palpation, there is mild tenderness to palpation of the bilateral low back, there is no spasm Neuro: 5/5 strength in bilateral quads, hamstrings, plantar and dorsiflexion, sensation to light touch is intact in BLE Exts: Non edematous BL  LE, warm and well perfused.    Assessment/Plan:  Please see individual problem list.  # Healthcare maintenance: up to date

## 2013-11-12 NOTE — Assessment & Plan Note (Signed)
Patient had thyroid rechecked recently and TSH was found to be low. Patient is asymptomatic at this time. Will decrease dose to 112 mcg daily. Plan to recheck in 2-3 months.

## 2013-11-12 NOTE — Assessment & Plan Note (Signed)
Patient with continued back pain relating to known DJD and spinal stenosis. Has been difficult to provide much relief with prior therapy. Patient with significant debility due to this pain and struggles to accomplish daily activities at home without assistance. Discussed continuation of scheduled tylenol, gabapentin, and infrequent use of percocet. Given lack of response to previous medications we will try lidoderm patches. Have had difficulty in the past getting this approved by insurance, though given other treatment failures I am hopeful a prior Josem Kaufmann will make this available to the patient and am hopeful that if she is able to get this medication that it will help mediate some of the pain she is experiencing. Will additionally order PT for home again as this has proven beneficial to the patient. F/u in one month.

## 2013-11-13 ENCOUNTER — Telehealth: Payer: Self-pay | Admitting: Family Medicine

## 2013-11-13 ENCOUNTER — Encounter: Payer: Self-pay | Admitting: Family Medicine

## 2013-11-13 ENCOUNTER — Ambulatory Visit (INDEPENDENT_AMBULATORY_CARE_PROVIDER_SITE_OTHER): Payer: Medicare Other | Admitting: Family Medicine

## 2013-11-13 ENCOUNTER — Encounter: Payer: Self-pay | Admitting: *Deleted

## 2013-11-13 ENCOUNTER — Telehealth: Payer: Self-pay | Admitting: *Deleted

## 2013-11-13 VITALS — BP 106/50 | HR 68 | Temp 97.6°F | Wt 127.0 lb

## 2013-11-13 DIAGNOSIS — I959 Hypotension, unspecified: Secondary | ICD-10-CM | POA: Insufficient documentation

## 2013-11-13 DIAGNOSIS — I5031 Acute diastolic (congestive) heart failure: Secondary | ICD-10-CM | POA: Diagnosis not present

## 2013-11-13 DIAGNOSIS — I509 Heart failure, unspecified: Secondary | ICD-10-CM | POA: Diagnosis not present

## 2013-11-13 DIAGNOSIS — I129 Hypertensive chronic kidney disease with stage 1 through stage 4 chronic kidney disease, or unspecified chronic kidney disease: Secondary | ICD-10-CM | POA: Diagnosis not present

## 2013-11-13 DIAGNOSIS — J449 Chronic obstructive pulmonary disease, unspecified: Secondary | ICD-10-CM | POA: Diagnosis not present

## 2013-11-13 DIAGNOSIS — I9589 Other hypotension: Secondary | ICD-10-CM | POA: Diagnosis not present

## 2013-11-13 DIAGNOSIS — N183 Chronic kidney disease, stage 3 unspecified: Secondary | ICD-10-CM | POA: Diagnosis not present

## 2013-11-13 DIAGNOSIS — Z950 Presence of cardiac pacemaker: Secondary | ICD-10-CM | POA: Diagnosis not present

## 2013-11-13 LAB — CBC WITH DIFFERENTIAL/PLATELET
BASOS ABS: 0 10*3/uL (ref 0.0–0.1)
BASOS PCT: 0 % (ref 0–1)
EOS ABS: 0.1 10*3/uL (ref 0.0–0.7)
Eosinophils Relative: 1 % (ref 0–5)
HCT: 35.3 % — ABNORMAL LOW (ref 36.0–46.0)
Hemoglobin: 12 g/dL (ref 12.0–15.0)
Lymphocytes Relative: 12 % (ref 12–46)
Lymphs Abs: 1.5 10*3/uL (ref 0.7–4.0)
MCH: 31.4 pg (ref 26.0–34.0)
MCHC: 34 g/dL (ref 30.0–36.0)
MCV: 92.4 fL (ref 78.0–100.0)
MONO ABS: 0.6 10*3/uL (ref 0.1–1.0)
Monocytes Relative: 5 % (ref 3–12)
NEUTROS ABS: 10.4 10*3/uL — AB (ref 1.7–7.7)
Neutrophils Relative %: 82 % — ABNORMAL HIGH (ref 43–77)
Platelets: 244 10*3/uL (ref 150–400)
RBC: 3.82 MIL/uL — ABNORMAL LOW (ref 3.87–5.11)
RDW: 14.9 % (ref 11.5–15.5)
WBC: 12.7 10*3/uL — ABNORMAL HIGH (ref 4.0–10.5)

## 2013-11-13 LAB — BASIC METABOLIC PANEL
BUN: 47 mg/dL — AB (ref 6–23)
CO2: 26 mEq/L (ref 19–32)
CREATININE: 1.29 mg/dL — AB (ref 0.50–1.10)
Calcium: 9 mg/dL (ref 8.4–10.5)
Chloride: 99 mEq/L (ref 96–112)
Glucose, Bld: 123 mg/dL — ABNORMAL HIGH (ref 70–99)
POTASSIUM: 4.4 meq/L (ref 3.5–5.3)
Sodium: 134 mEq/L — ABNORMAL LOW (ref 135–145)

## 2013-11-13 MED ORDER — DOCUSATE SODIUM 100 MG PO CAPS: 100.0000 mg | ORAL_CAPSULE | Freq: Two times a day (BID) | ORAL | Status: DC

## 2013-11-13 NOTE — Telephone Encounter (Signed)
Please contact [facility] and give my verbal order to continue physical therapy.   Could you additionally call the patient to see if EMS was actually called and to see if they went to the ED for this issue. If she is truly lethargic she should be evaluated today in the ED or in the office. The patient should be advised to hold her blood pressure medications today (chlorthalidone, clonidine, lasix, and verapamil) as her blood pressure is low and could be contributing to her lethargy. Thanks.

## 2013-11-13 NOTE — Telephone Encounter (Signed)
Received refill request for RA [facility] 100 mg capsule.  Unable to reorder medication in EPIC.  Derl Barrow, RN

## 2013-11-13 NOTE — Telephone Encounter (Addendum)
Per your office notes on 7/14 visit with patient, PT is to be continued.  Please contact Heather at [facility] for verbal order.  Also reported that bp was on the low side today.  Reading was btwn 95-100 over 45.  Ems was contacted this am due to patient having some lethargy.  Had lg diarrhea episodes twice and daughter withheld giving Clonidine and Levothyroxine due to condition of low bp.  Weight is 127.5 and HR 75.

## 2013-11-13 NOTE — Patient Instructions (Signed)
We are checking labs today and I will call if anything is NOT normal. Please hold lasix and only give 1/2 tablet of clonidine once daily for the next two days. Make an appointment to follow up in clinic on Friday. In the meantime, be sure to stay hydrated. Drink some gatorade or other electrolyte-containing fluid as well. Decrease gabapentin and percocet as these can make low blood pressure and fatigue worse. Avoid falls by staying seated or laying at all times and getting help with moving around. If you have fevers, chills, chest pain, trouble breathing, falls, or other concerns, seek immediate care.  Hilton Sinclair, MD

## 2013-11-13 NOTE — Telephone Encounter (Signed)
LM for heather to call back and receive verbal orders to continue PT.  Also spoke with daughter [facility] regarding patient and she was currently resting from being up all night.  Pt seems to be feeling some better and daughter is home with her now.  Also when EMS came out to the house this morning they suggested that pt was stable.  Daughter is concerned with her BP at 102/47 at 2:45pm.  Per patient she doesn't like to go to ED and would prefer being seen in the clinic.  Spoke with Dr. Caryl Bis and he agreed with this.  Pt is coming in today to see Dr. Genia Hotter

## 2013-11-13 NOTE — Progress Notes (Signed)
Prior Authorization received from [employer] for Lidocaine 5% Patch.  PA form placed in provider box for completion. Derl Barrow, RN

## 2013-11-13 NOTE — Progress Notes (Signed)
Patient ID: Jordan [name], female   DOB: May 01, 1920, 80 y.o.   MRN: [id number] Subjective:   CC: Fatigue, low blood pressure  HPI:   This is a 80 year old female with history of Alzheimer's dementia, chronic diastolic heart failure, CAD stage III, hypertension, and Mobitz type II second-degree heart block with pacemaker here with fatigue and low blood pressure. Per her daughter and son, she woke up last night around 1:30 AM with abdominal pain that persisted for a couple of hours followed by an episode of loose and watery diarrhea. Family denies fever, shortness of breath, chest pain, dizziness, syncope, bloody emesis or bloody diarrhea, dysuria, or new back pain. They do report she had a different diet yesterday with more food than usual. She has not had any sick contacts or new medications. They have also not noticed any focal neurologic changes such as changes gait, speech, or limb strength. She also denies any current symptoms other than just feeling fatigued. They called EMS this morning because she seemed to to be more tired than usual, and blood pressure was measured at 100s/50s. They also checked with her home blood pressure cuff and it was the same. She chronically does not eat or drink well, drinking at most 3 glasses of water daily. She has wanted to be even less over the past day.   Review of Systems - Per HPI.  Social history: Daughter and son are very involved in patient's life, and she has home [employer], applications. Past medical history: Medications reviewed. Patient reports not taking chlorthalidone or verapamil anymore.She takes gabapentin daily but very rarely takes Percocet.    Objective:  Physical Exam BP 106/50  Pulse 68  Temp(Src) 97.6 F (36.4 C) (Oral)  Wt 127 lb (57.607 kg) GEN: NAD, seated in wheelchair, mildly tired-appearing  Cardiovascular: Regular rate and rhythm, distant, 2+ bilateral radial pulses Pulmonary: Good air movement throughout with fine crackles heard throughout  all lung fields, normal effort Abdomen: Soft, nontender, nondistended, abdominal hernia that is easily reducible Extremities: No lower extremity edema or tenderness Neuro: No focal deficits, normal speech, extraocular movements intact Skin: No rash or cyanosis    Assessment:     Jordan [name] is a 81 y.o. female with complex past medical history here for fatigue and low blood pressure measured at home.    Plan:     Low blood pressure and fatigue Patient's baseline systolic blood pressure seems to be in the 120s-130s over the past week. She takes clonidine and Lasix daily. She did not take either of these this morning. Blood pressure still in clinic but when measured orthostatics are negative and blood pressures up to the [id number] systolic. Recent episode of diarrhea and poor by mouth. No urinary pulmonary, or infective symptoms. Likely some component of food poisoning versus viral gastroenteritis. Stable vital signs and no neurologic deficits today. -Orthostatic vital signs: Negative -BMET and CBC with differential -Weight is down 3 pounds from yesterday. Hold Lasix and decrease clonidine to one half tablet once daily over the next 2 days. -Increase PO and include some electrolyte containing liquids. -Followup in clinic in 2 days for reevaluation -Minimize gabapentin and Percocet use in elderly patient especially with current weakness and mildly low blood pressure -Return precautions reviewed    Hilton Sinclair, MD [place]

## 2013-11-13 NOTE — Assessment & Plan Note (Addendum)
Patient's baseline systolic blood pressure seems to be in the 120s-130s over the past week. She takes clonidine and Lasix daily. She did not take either of these this morning. Blood pressure still in clinic but when measured orthostatics are negative and blood pressures up to the [id number] systolic. Recent episode of diarrhea and poor by mouth. No urinary pulmonary, or infective symptoms. Likely some component of food poisoning versus viral gastroenteritis. Stable vital signs and no neurologic deficits today. -Orthostatic vital signs: Negative -BMET and CBC with differential -Weight is down 3 pounds from yesterday. Hold Lasix and decrease clonidine to one half tablet once daily over the next 2 days. -Increase PO and include some electrolyte containing liquids. -Followup in clinic in 2 days for reevaluation -Minimize gabapentin and Percocet use in elderly patient especially with current weakness and mildly low blood pressure -Return precautions reviewed

## 2013-11-14 ENCOUNTER — Telehealth: Payer: Self-pay | Admitting: Family Medicine

## 2013-11-14 ENCOUNTER — Telehealth: Payer: Self-pay

## 2013-11-14 ENCOUNTER — Encounter: Payer: Self-pay | Admitting: Internal Medicine

## 2013-11-14 DIAGNOSIS — N183 Chronic kidney disease, stage 3 unspecified: Secondary | ICD-10-CM | POA: Diagnosis not present

## 2013-11-14 DIAGNOSIS — M1990 Unspecified osteoarthritis, unspecified site: Secondary | ICD-10-CM | POA: Diagnosis not present

## 2013-11-14 DIAGNOSIS — J449 Chronic obstructive pulmonary disease, unspecified: Secondary | ICD-10-CM | POA: Diagnosis not present

## 2013-11-14 DIAGNOSIS — I5031 Acute diastolic (congestive) heart failure: Secondary | ICD-10-CM | POA: Diagnosis not present

## 2013-11-14 DIAGNOSIS — I509 Heart failure, unspecified: Secondary | ICD-10-CM | POA: Diagnosis not present

## 2013-11-14 DIAGNOSIS — Z950 Presence of cardiac pacemaker: Secondary | ICD-10-CM | POA: Diagnosis not present

## 2013-11-14 DIAGNOSIS — I129 Hypertensive chronic kidney disease with stage 1 through stage 4 chronic kidney disease, or unspecified chronic kidney disease: Secondary | ICD-10-CM | POA: Diagnosis not present

## 2013-11-14 MED ORDER — DOCUSATE SODIUM 100 MG PO CAPS: 100.0000 mg | ORAL_CAPSULE | Freq: Every day | ORAL | Status: DC | PRN

## 2013-11-14 NOTE — Telephone Encounter (Signed)
[name] informed.  Is concerned because mom had a black stool this AM, after [name] told me she did take a dose of pepto bismal advised that this is a normal side effect.  [name] was relieved by that.    Of note,  [name] states that she had a friend who has low BP and black stools who ended up having an ulcer.  She is afraid this is a problem that her mom may be having, she would like Dr. Tharon Aquas advice on this.  Advised I would send the message to him. James Lafalce, [name]

## 2013-11-14 NOTE — Telephone Encounter (Signed)
Left message with CMA for patient's daughter to call and receive the following message: Sodium is mildly low and Cr mildly up from her baseline, which is likely due to her not eating/drinking as much and having some diarrhea. I will make the same recommendation to work on staying a little better hydrated and while not eating well, to have fluids with electrolytes like soups or gatorade. WBC is up too but that is not specific and could be due to her not feeling well. However, if she has fevers, chills, or other concerns, she should seek care sooner than the recommended f/u in 1-2 days.  Hilton Sinclair, MD PGY-3, [place]

## 2013-11-14 NOTE — Telephone Encounter (Signed)
Called patient daughter Jordan [name] and left her a message a lot of office's  in [place] don't take medicaid. [facility] in [place] and they do accept her insurance asked daughter to call me and let me no if this would be to far for them to drive from  [place].

## 2013-11-14 NOTE — Telephone Encounter (Signed)
Refill given

## 2013-11-14 NOTE — Telephone Encounter (Signed)
Please inform [name] that if Jordan [name] continues to have black stools after stopping the pepto then she should be evaluated for this. Though given that her hemoglobin is normal and actually increased from 2 months ago, I doubt she is having a bleed in her stomach. Thanks.

## 2013-11-14 NOTE — Telephone Encounter (Signed)
Pt has appt tomorrow.  Will inform then. [name], [name]

## 2013-11-15 ENCOUNTER — Encounter: Payer: Self-pay | Admitting: Family Medicine

## 2013-11-15 ENCOUNTER — Ambulatory Visit: Payer: Medicare Other | Admitting: Family Medicine

## 2013-11-15 ENCOUNTER — Ambulatory Visit (INDEPENDENT_AMBULATORY_CARE_PROVIDER_SITE_OTHER): Payer: Medicare Other | Admitting: Family Medicine

## 2013-11-15 VITALS — BP 147/67 | HR 88 | Temp 98.4°F | Wt 126.0 lb

## 2013-11-15 DIAGNOSIS — I9589 Other hypotension: Secondary | ICD-10-CM

## 2013-11-15 DIAGNOSIS — R269 Unspecified abnormalities of gait and mobility: Secondary | ICD-10-CM | POA: Diagnosis not present

## 2013-11-15 DIAGNOSIS — D649 Anemia, unspecified: Secondary | ICD-10-CM

## 2013-11-15 DIAGNOSIS — E871 Hypo-osmolality and hyponatremia: Secondary | ICD-10-CM | POA: Diagnosis not present

## 2013-11-15 DIAGNOSIS — R2681 Unsteadiness on feet: Secondary | ICD-10-CM

## 2013-11-15 NOTE — Progress Notes (Signed)
Patient ID: Jordan [name], female   DOB: February 26, 1920, 80 y.o.   MRN: [id number] Prior auth filled out and returned to [employer].

## 2013-11-15 NOTE — Progress Notes (Signed)
Patient ID: Jordan [name], female   DOB: 11-Nov-1919, 80 y.o.   MRN: [id number]  [name], MD Phone: [phone]  Jordan [name] is a 80 y.o. female who presents today for f/u.  Patient seen 2 days ago for evaluation of low BP and feeling poorly. Children report today that the patient had 2 episodes of diarrhea prior to being evaluated at her last visit. They noted that this occurred in the middle of the night and then in the morning the CNA came to the house and checked the patients blood pressure and noted it was 90/45. At this point they called EMS as the patient was less responsive than usual. The patients daughter reports that EMS checked her out and her blood pressure was improved to [id number] systolic and EMS stated that she was stable. The daughter then brought her in to be seen in clinic.   Today the patient reports that she feels better. She has not had anymore diarrhea. With her last episode of diarrhea, it was black, though they report this was after the patient took peptobismal. She has not had any abdominal pain or diarrhea over the past 2 days. Her blood pressure has improved as well as they had decreased the clonidine to 1/2 tablet daily.  The patients daughter reports that she had a meeting with a CAP program social worker to help get her mother additional services at home. The daughter reports that the patient is rarely left by herself at home relating to her difficulties ambulating. She walks with a walker and remains unstable with this. She uses a wheel chair when out of the house. The daughter notes the patient has fallen previously, though the last time she fell was 2 years ago.  Patient is a nonsmoker.   ROS: Per HPI   Physical Exam Filed Vitals:   11/15/13 1026  BP: 147/67  Pulse: 88  Temp: 98.4 F (36.9 C)    Gen: Well NAD, sitting in wheel chair Lungs: CTABL Nl WOB Heart: RRR no MRG Abd: soft, NT, ND Exts: Non edematous BL  LE, warm and well perfused.     Assessment/Plan: Please see individual problem list.  # Healthcare maintenance: up to date

## 2013-11-15 NOTE — Assessment & Plan Note (Signed)
Blood pressure improved today. Patient feeling better as well. Will have patient take clonidine 1/2 tablet BID at this time. She is to continue to increase fluid and PO intake as her labs were likely indicative her having diarrhea related to a possible gastroenteritis vs food poisoning. Will have patient return in one week for repeat lab work. F/u with me in one month.

## 2013-11-15 NOTE — Patient Instructions (Signed)
Nice to see you. I'm glad you're feeling better. Please try to drink plenty of fluids and stay well hydrated. Please take the clonidine 1/2 a tablet twice a day. Please return in one week for a lab appointment.

## 2013-11-15 NOTE — Progress Notes (Signed)
PA form faxed to [facility] for review.  Derl Barrow, RN

## 2013-11-15 NOTE — Assessment & Plan Note (Signed)
Patient noted to have prior history of falls and reported gait instability with walker. The patients daughter has contacted the CAP program for additional help in caring for her mother. I believe this would be beneficial for the patient given her mobility issues and her history of dementia. I do not think she is able to safely be at home alone at this time given this history and the patient and her family would greatly benefit from additional help that would allow them to care for her at home.

## 2013-11-15 NOTE — Progress Notes (Signed)
PA for Lidocaine 5% patch denied.  [facility] aware.  Derl Barrow, RN

## 2013-11-19 ENCOUNTER — Telehealth: Payer: Self-pay | Admitting: *Deleted

## 2013-11-19 DIAGNOSIS — I5031 Acute diastolic (congestive) heart failure: Secondary | ICD-10-CM | POA: Diagnosis not present

## 2013-11-19 DIAGNOSIS — N183 Chronic kidney disease, stage 3 unspecified: Secondary | ICD-10-CM | POA: Diagnosis not present

## 2013-11-19 DIAGNOSIS — I509 Heart failure, unspecified: Secondary | ICD-10-CM | POA: Diagnosis not present

## 2013-11-19 DIAGNOSIS — J449 Chronic obstructive pulmonary disease, unspecified: Secondary | ICD-10-CM | POA: Diagnosis not present

## 2013-11-19 DIAGNOSIS — Z950 Presence of cardiac pacemaker: Secondary | ICD-10-CM | POA: Diagnosis not present

## 2013-11-19 DIAGNOSIS — I129 Hypertensive chronic kidney disease with stage 1 through stage 4 chronic kidney disease, or unspecified chronic kidney disease: Secondary | ICD-10-CM | POA: Diagnosis not present

## 2013-11-19 NOTE — Telephone Encounter (Signed)
Verbal recertification can be given to [facility]. Could you please call them to inform them of this. Thanks.

## 2013-11-19 NOTE — Telephone Encounter (Signed)
Nira Conn, RN with [facility] called and verbal order given for recertification.  Nira Conn also stated that pt's blood pressure was 182/? And pt's daughter gave her a full tab of clonidine around 11 AM.  Nira Conn did her assessment around 1 PM BP reading was 95/?, Nira Conn was not sure was the diastolic number was.  Pt's daughter is requesting a call from PCP; please call [phone].  Derl Barrow, RN

## 2013-11-19 NOTE — Telephone Encounter (Signed)
Have attempted several time to contact heather.  All 3 times, I got a busy signal.  Will await callback. Alick Lecomte, [name]

## 2013-11-19 NOTE — Telephone Encounter (Signed)
Nira Conn, RN from [facility] called needing a recert for 60 days.  She wants to expend home health visits due to pt's blood pressure and mental status.  She needs more visits and if pt improves she will be discharged at that time from home health.  Please give her a call at [phone].  Derl Barrow, RN

## 2013-11-20 ENCOUNTER — Telehealth: Payer: Self-pay | Admitting: Family Medicine

## 2013-11-20 NOTE — Telephone Encounter (Signed)
[name] called and missed Dr. Marlaine Hind call and would like for him to call her back. jw

## 2013-11-20 NOTE — Telephone Encounter (Signed)
Spoke with patients daughter regarding her blood pressure. Please see phone note from 11/19/13 for prior nursing conversation. Today blood pressure high this morning. To the 170's. Later on after she got 1/2 tablet it went to 110/50. She typically takes clonidine at 10:30 am and 6-7 pm. Daughter states patient feels tired and more achey when her blood pressure is too. I discussed only taking 1/2 a tablet at a time. She should take this roughly every 12 hours. I advised to hold this medication if her blood pressure is less than [id number] systolic. If it is consistently less than this we will need to have the patient come in next week to reevaluate the blood pressure treatment.

## 2013-11-20 NOTE — Telephone Encounter (Signed)
Called Jordan [name] back. Went to [employer]. Asked that she call us back at [phone].

## 2013-11-21 DIAGNOSIS — I509 Heart failure, unspecified: Secondary | ICD-10-CM | POA: Diagnosis not present

## 2013-11-21 DIAGNOSIS — J449 Chronic obstructive pulmonary disease, unspecified: Secondary | ICD-10-CM | POA: Diagnosis not present

## 2013-11-21 DIAGNOSIS — I129 Hypertensive chronic kidney disease with stage 1 through stage 4 chronic kidney disease, or unspecified chronic kidney disease: Secondary | ICD-10-CM | POA: Diagnosis not present

## 2013-11-21 DIAGNOSIS — Z950 Presence of cardiac pacemaker: Secondary | ICD-10-CM | POA: Diagnosis not present

## 2013-11-21 DIAGNOSIS — N183 Chronic kidney disease, stage 3 unspecified: Secondary | ICD-10-CM | POA: Diagnosis not present

## 2013-11-21 DIAGNOSIS — I5031 Acute diastolic (congestive) heart failure: Secondary | ICD-10-CM | POA: Diagnosis not present

## 2013-11-22 DIAGNOSIS — J449 Chronic obstructive pulmonary disease, unspecified: Secondary | ICD-10-CM | POA: Diagnosis not present

## 2013-11-22 DIAGNOSIS — Z950 Presence of cardiac pacemaker: Secondary | ICD-10-CM | POA: Diagnosis not present

## 2013-11-22 DIAGNOSIS — I129 Hypertensive chronic kidney disease with stage 1 through stage 4 chronic kidney disease, or unspecified chronic kidney disease: Secondary | ICD-10-CM | POA: Diagnosis not present

## 2013-11-22 DIAGNOSIS — I509 Heart failure, unspecified: Secondary | ICD-10-CM | POA: Diagnosis not present

## 2013-11-22 DIAGNOSIS — I5031 Acute diastolic (congestive) heart failure: Secondary | ICD-10-CM | POA: Diagnosis not present

## 2013-11-22 DIAGNOSIS — N183 Chronic kidney disease, stage 3 unspecified: Secondary | ICD-10-CM | POA: Diagnosis not present

## 2013-11-22 DIAGNOSIS — H353190 Nonexudative age-related macular degeneration, unspecified eye, stage unspecified: Secondary | ICD-10-CM | POA: Diagnosis not present

## 2013-11-26 DIAGNOSIS — Z950 Presence of cardiac pacemaker: Secondary | ICD-10-CM | POA: Diagnosis not present

## 2013-11-26 DIAGNOSIS — I129 Hypertensive chronic kidney disease with stage 1 through stage 4 chronic kidney disease, or unspecified chronic kidney disease: Secondary | ICD-10-CM | POA: Diagnosis not present

## 2013-11-26 DIAGNOSIS — I5031 Acute diastolic (congestive) heart failure: Secondary | ICD-10-CM | POA: Diagnosis not present

## 2013-11-26 DIAGNOSIS — I509 Heart failure, unspecified: Secondary | ICD-10-CM | POA: Diagnosis not present

## 2013-11-26 DIAGNOSIS — J449 Chronic obstructive pulmonary disease, unspecified: Secondary | ICD-10-CM | POA: Diagnosis not present

## 2013-11-26 DIAGNOSIS — N183 Chronic kidney disease, stage 3 unspecified: Secondary | ICD-10-CM | POA: Diagnosis not present

## 2013-11-27 ENCOUNTER — Telehealth: Payer: Self-pay | Admitting: *Deleted

## 2013-11-27 DIAGNOSIS — I129 Hypertensive chronic kidney disease with stage 1 through stage 4 chronic kidney disease, or unspecified chronic kidney disease: Secondary | ICD-10-CM | POA: Diagnosis not present

## 2013-11-27 DIAGNOSIS — I5031 Acute diastolic (congestive) heart failure: Secondary | ICD-10-CM | POA: Diagnosis not present

## 2013-11-27 DIAGNOSIS — I509 Heart failure, unspecified: Secondary | ICD-10-CM | POA: Diagnosis not present

## 2013-11-27 DIAGNOSIS — N183 Chronic kidney disease, stage 3 unspecified: Secondary | ICD-10-CM | POA: Diagnosis not present

## 2013-11-27 DIAGNOSIS — Z950 Presence of cardiac pacemaker: Secondary | ICD-10-CM | POA: Diagnosis not present

## 2013-11-27 DIAGNOSIS — J449 Chronic obstructive pulmonary disease, unspecified: Secondary | ICD-10-CM | POA: Diagnosis not present

## 2013-11-27 NOTE — Telephone Encounter (Signed)
Dr. Arlean Hopping office called and states that she can not see this patient. No reason given just declined the referral. Will needed another rheumatology referral. The patient has Medicaid/Medicare and Dr. Trudie Reed does not accept Medicaid either. Referral and information has been given to [facility].

## 2013-11-28 ENCOUNTER — Telehealth: Payer: Self-pay | Admitting: Family Medicine

## 2013-11-28 DIAGNOSIS — I509 Heart failure, unspecified: Secondary | ICD-10-CM | POA: Diagnosis not present

## 2013-11-28 DIAGNOSIS — I129 Hypertensive chronic kidney disease with stage 1 through stage 4 chronic kidney disease, or unspecified chronic kidney disease: Secondary | ICD-10-CM | POA: Diagnosis not present

## 2013-11-28 DIAGNOSIS — I5031 Acute diastolic (congestive) heart failure: Secondary | ICD-10-CM | POA: Diagnosis not present

## 2013-11-28 DIAGNOSIS — J449 Chronic obstructive pulmonary disease, unspecified: Secondary | ICD-10-CM | POA: Diagnosis not present

## 2013-11-28 DIAGNOSIS — Z950 Presence of cardiac pacemaker: Secondary | ICD-10-CM | POA: Diagnosis not present

## 2013-11-28 DIAGNOSIS — N183 Chronic kidney disease, stage 3 unspecified: Secondary | ICD-10-CM | POA: Diagnosis not present

## 2013-11-28 NOTE — Telephone Encounter (Signed)
Heather from [facility] calls, patient is wanting to increase usage of pain patches. She is using a whole patch but would like to use 2. Also, patient's BP is still dropping frequently. Please call Nira Conn to discuss [phone].

## 2013-11-29 NOTE — Telephone Encounter (Signed)
Called Heather from [facility] to discuss the phone message I received. The patient has been using lidoderm patches. Has been using a whole patch daily applied to her shoulder. At this time I am unsure how she got these patches as they have been declined by her insurance each time I prescribed them. The question from the nurse was if the patient could use 2 patches, one on her shoulder and one on her back, each day. I advised that she should not use 2 patches. She should only use one patch each day. She could potentially alternate usign the patch on each her shoulder and back every other day. The cream from ortho was not beneficial. Blood pressure in am 160/80. When nurse comes in 110/45. Nurse told to care giver to check BID to monitor. We will continue to monitor this. If continues to be low will need to consider altering her medication regimen. Patient is reported to be asymptomatic with this.

## 2013-11-29 NOTE — Telephone Encounter (Signed)
Called and left patient's daughter another message she has not returned my call. Patient's PCP will have to do referral  because of here insurance.

## 2013-12-03 DIAGNOSIS — J449 Chronic obstructive pulmonary disease, unspecified: Secondary | ICD-10-CM | POA: Diagnosis not present

## 2013-12-03 DIAGNOSIS — I129 Hypertensive chronic kidney disease with stage 1 through stage 4 chronic kidney disease, or unspecified chronic kidney disease: Secondary | ICD-10-CM | POA: Diagnosis not present

## 2013-12-03 DIAGNOSIS — I509 Heart failure, unspecified: Secondary | ICD-10-CM | POA: Diagnosis not present

## 2013-12-03 DIAGNOSIS — N183 Chronic kidney disease, stage 3 unspecified: Secondary | ICD-10-CM | POA: Diagnosis not present

## 2013-12-03 DIAGNOSIS — I5031 Acute diastolic (congestive) heart failure: Secondary | ICD-10-CM | POA: Diagnosis not present

## 2013-12-03 DIAGNOSIS — Z950 Presence of cardiac pacemaker: Secondary | ICD-10-CM | POA: Diagnosis not present

## 2013-12-05 DIAGNOSIS — I509 Heart failure, unspecified: Secondary | ICD-10-CM | POA: Diagnosis not present

## 2013-12-05 DIAGNOSIS — J449 Chronic obstructive pulmonary disease, unspecified: Secondary | ICD-10-CM | POA: Diagnosis not present

## 2013-12-05 DIAGNOSIS — I5031 Acute diastolic (congestive) heart failure: Secondary | ICD-10-CM | POA: Diagnosis not present

## 2013-12-05 DIAGNOSIS — Z950 Presence of cardiac pacemaker: Secondary | ICD-10-CM | POA: Diagnosis not present

## 2013-12-05 DIAGNOSIS — I129 Hypertensive chronic kidney disease with stage 1 through stage 4 chronic kidney disease, or unspecified chronic kidney disease: Secondary | ICD-10-CM | POA: Diagnosis not present

## 2013-12-05 DIAGNOSIS — N183 Chronic kidney disease, stage 3 unspecified: Secondary | ICD-10-CM | POA: Diagnosis not present

## 2013-12-06 ENCOUNTER — Telehealth: Payer: Self-pay | Admitting: Neurology

## 2013-12-06 ENCOUNTER — Telehealth: Payer: Self-pay | Admitting: Family Medicine

## 2013-12-06 NOTE — Telephone Encounter (Signed)
Patient's daughter returning Hinton Dyer C.'s call regarding patient's referral, please return call to daughter and advise.

## 2013-12-06 NOTE — Telephone Encounter (Signed)
Called  Jordan [name] Left her a message PCP will have to do referral because of her insurance.

## 2013-12-06 NOTE — Telephone Encounter (Signed)
[name] called and she said that her mother BP has been running between 145/75 and 182/75. She wanted to know what should she do. Please call. jw

## 2013-12-06 NOTE — Telephone Encounter (Signed)
Called patients daughter to discuss patients blood pressure. Has been mostly in the 140's-150's, with some pressures in the 0000000 systolic. Daughter notes had one episode of light headedness yesterday, though this resolved quickly and they did not check the patients blood pressure with this. She had no chest pain or trouble breathing with this. I advised to continue her current medication regimen for her blood pressure and discussed precautions that would require evaluation over the weekend.

## 2013-12-10 DIAGNOSIS — I129 Hypertensive chronic kidney disease with stage 1 through stage 4 chronic kidney disease, or unspecified chronic kidney disease: Secondary | ICD-10-CM | POA: Diagnosis not present

## 2013-12-10 DIAGNOSIS — I5031 Acute diastolic (congestive) heart failure: Secondary | ICD-10-CM | POA: Diagnosis not present

## 2013-12-10 DIAGNOSIS — Z950 Presence of cardiac pacemaker: Secondary | ICD-10-CM | POA: Diagnosis not present

## 2013-12-10 DIAGNOSIS — J449 Chronic obstructive pulmonary disease, unspecified: Secondary | ICD-10-CM | POA: Diagnosis not present

## 2013-12-10 DIAGNOSIS — N183 Chronic kidney disease, stage 3 unspecified: Secondary | ICD-10-CM | POA: Diagnosis not present

## 2013-12-10 DIAGNOSIS — I509 Heart failure, unspecified: Secondary | ICD-10-CM | POA: Diagnosis not present

## 2013-12-12 DIAGNOSIS — N183 Chronic kidney disease, stage 3 unspecified: Secondary | ICD-10-CM | POA: Diagnosis not present

## 2013-12-12 DIAGNOSIS — J449 Chronic obstructive pulmonary disease, unspecified: Secondary | ICD-10-CM | POA: Diagnosis not present

## 2013-12-12 DIAGNOSIS — I5031 Acute diastolic (congestive) heart failure: Secondary | ICD-10-CM | POA: Diagnosis not present

## 2013-12-12 DIAGNOSIS — I509 Heart failure, unspecified: Secondary | ICD-10-CM | POA: Diagnosis not present

## 2013-12-12 DIAGNOSIS — I129 Hypertensive chronic kidney disease with stage 1 through stage 4 chronic kidney disease, or unspecified chronic kidney disease: Secondary | ICD-10-CM | POA: Diagnosis not present

## 2013-12-12 DIAGNOSIS — Z950 Presence of cardiac pacemaker: Secondary | ICD-10-CM | POA: Diagnosis not present

## 2013-12-13 DIAGNOSIS — I129 Hypertensive chronic kidney disease with stage 1 through stage 4 chronic kidney disease, or unspecified chronic kidney disease: Secondary | ICD-10-CM | POA: Diagnosis not present

## 2013-12-13 DIAGNOSIS — I509 Heart failure, unspecified: Secondary | ICD-10-CM | POA: Diagnosis not present

## 2013-12-13 DIAGNOSIS — Z950 Presence of cardiac pacemaker: Secondary | ICD-10-CM | POA: Diagnosis not present

## 2013-12-13 DIAGNOSIS — J449 Chronic obstructive pulmonary disease, unspecified: Secondary | ICD-10-CM | POA: Diagnosis not present

## 2013-12-13 DIAGNOSIS — N183 Chronic kidney disease, stage 3 unspecified: Secondary | ICD-10-CM | POA: Diagnosis not present

## 2013-12-13 DIAGNOSIS — I5031 Acute diastolic (congestive) heart failure: Secondary | ICD-10-CM | POA: Diagnosis not present

## 2013-12-16 ENCOUNTER — Telehealth: Payer: Self-pay | Admitting: *Deleted

## 2013-12-16 ENCOUNTER — Telehealth: Payer: Self-pay | Admitting: Neurology

## 2013-12-16 NOTE — Telephone Encounter (Signed)
Heather, RN with [facility] called stating pt is still having low blood pressure reading around noon- 2PM at her assessment.  BPs are reading in 90s/60s down from morning readings 130s/80s.  Care givers are giving pt 0.5 mg of clonidine.  Pt has one more home health visit around 12/25/2013 and she will be discharged.  Per Nira Conn pt is asymptotic at this time.  If you have questions please give her a call at [phone].  Derl Barrow, RN

## 2013-12-16 NOTE — Telephone Encounter (Signed)
Patient's daughter calling to state that she is still waiting on [place] call regarding her mother's referral, please return call and advise, she can be reached at [phone], after 10 AM.

## 2013-12-17 ENCOUNTER — Other Ambulatory Visit: Payer: Self-pay | Admitting: *Deleted

## 2013-12-17 DIAGNOSIS — Z950 Presence of cardiac pacemaker: Secondary | ICD-10-CM | POA: Diagnosis not present

## 2013-12-17 DIAGNOSIS — I129 Hypertensive chronic kidney disease with stage 1 through stage 4 chronic kidney disease, or unspecified chronic kidney disease: Secondary | ICD-10-CM | POA: Diagnosis not present

## 2013-12-17 DIAGNOSIS — I5031 Acute diastolic (congestive) heart failure: Secondary | ICD-10-CM | POA: Diagnosis not present

## 2013-12-17 DIAGNOSIS — N183 Chronic kidney disease, stage 3 unspecified: Secondary | ICD-10-CM | POA: Diagnosis not present

## 2013-12-17 DIAGNOSIS — I509 Heart failure, unspecified: Secondary | ICD-10-CM | POA: Diagnosis not present

## 2013-12-17 DIAGNOSIS — J449 Chronic obstructive pulmonary disease, unspecified: Secondary | ICD-10-CM | POA: Diagnosis not present

## 2013-12-17 MED ORDER — CLONIDINE HCL 0.1 MG PO TABS: ORAL_TABLET | ORAL | Status: DC

## 2013-12-17 NOTE — Telephone Encounter (Signed)
Pt's information was sent to [place] Dyer, [place], Dr. Krista Blue assistant. Please advise

## 2013-12-17 NOTE — Telephone Encounter (Signed)
Called and spoke to patients daughter Stasia Cavalier and told her patient's pcp would have to do referral. [name] understood and she would call her mother's PCP.

## 2013-12-18 ENCOUNTER — Ambulatory Visit: Payer: Medicare Other | Admitting: Family Medicine

## 2013-12-18 NOTE — Telephone Encounter (Signed)
Will discuss with patient and daughter at visit this afternoon.

## 2013-12-19 ENCOUNTER — Encounter: Payer: Medicare Other | Admitting: Internal Medicine

## 2013-12-19 DIAGNOSIS — I5031 Acute diastolic (congestive) heart failure: Secondary | ICD-10-CM | POA: Diagnosis not present

## 2013-12-19 DIAGNOSIS — J449 Chronic obstructive pulmonary disease, unspecified: Secondary | ICD-10-CM | POA: Diagnosis not present

## 2013-12-19 DIAGNOSIS — I509 Heart failure, unspecified: Secondary | ICD-10-CM | POA: Diagnosis not present

## 2013-12-19 DIAGNOSIS — Z950 Presence of cardiac pacemaker: Secondary | ICD-10-CM | POA: Diagnosis not present

## 2013-12-19 DIAGNOSIS — I129 Hypertensive chronic kidney disease with stage 1 through stage 4 chronic kidney disease, or unspecified chronic kidney disease: Secondary | ICD-10-CM | POA: Diagnosis not present

## 2013-12-19 DIAGNOSIS — N183 Chronic kidney disease, stage 3 unspecified: Secondary | ICD-10-CM | POA: Diagnosis not present

## 2013-12-24 ENCOUNTER — Telehealth: Payer: Self-pay | Admitting: *Deleted

## 2013-12-24 ENCOUNTER — Other Ambulatory Visit: Payer: Self-pay

## 2013-12-24 DIAGNOSIS — I509 Heart failure, unspecified: Secondary | ICD-10-CM | POA: Diagnosis not present

## 2013-12-24 DIAGNOSIS — Z950 Presence of cardiac pacemaker: Secondary | ICD-10-CM | POA: Diagnosis not present

## 2013-12-24 DIAGNOSIS — J449 Chronic obstructive pulmonary disease, unspecified: Secondary | ICD-10-CM | POA: Diagnosis not present

## 2013-12-24 DIAGNOSIS — I5031 Acute diastolic (congestive) heart failure: Secondary | ICD-10-CM | POA: Diagnosis not present

## 2013-12-24 DIAGNOSIS — N183 Chronic kidney disease, stage 3 unspecified: Secondary | ICD-10-CM | POA: Diagnosis not present

## 2013-12-24 DIAGNOSIS — I129 Hypertensive chronic kidney disease with stage 1 through stage 4 chronic kidney disease, or unspecified chronic kidney disease: Secondary | ICD-10-CM | POA: Diagnosis not present

## 2013-12-24 NOTE — Telephone Encounter (Signed)
Nira Conn, RN from [facility] called requesting an order for urine culture and sensitivity.  Pt's daughter reports pt is having urinary frequency.  Verbal order given by Dr. Bennetta Laos for UA, culture and sensitivity given. Have pt's daughter to schedule an appointment with [facility].  Derl Barrow, RN

## 2013-12-25 ENCOUNTER — Ambulatory Visit (INDEPENDENT_AMBULATORY_CARE_PROVIDER_SITE_OTHER): Payer: Medicare Other | Admitting: Internal Medicine

## 2013-12-25 ENCOUNTER — Encounter: Payer: Self-pay | Admitting: Internal Medicine

## 2013-12-25 VITALS — BP 110/48 | HR 77 | Ht 59.0 in | Wt 131.8 lb

## 2013-12-25 DIAGNOSIS — I441 Atrioventricular block, second degree: Secondary | ICD-10-CM | POA: Diagnosis not present

## 2013-12-25 DIAGNOSIS — I6350 Cerebral infarction due to unspecified occlusion or stenosis of unspecified cerebral artery: Secondary | ICD-10-CM

## 2013-12-25 DIAGNOSIS — I10 Essential (primary) hypertension: Secondary | ICD-10-CM | POA: Diagnosis not present

## 2013-12-25 LAB — MDC_IDC_ENUM_SESS_TYPE_INCLINIC
Battery Voltage: 2.79 V
Brady Statistic AP VS Percent: 0 %
Brady Statistic AS VP Percent: 90 %
Brady Statistic AS VS Percent: 0 %
Date Time Interrogation Session: 20150826162404
Lead Channel Pacing Threshold Amplitude: 0.5 V
Lead Channel Pacing Threshold Amplitude: 1 V
Lead Channel Pacing Threshold Pulse Width: 0.4 ms
Lead Channel Pacing Threshold Pulse Width: 0.4 ms
Lead Channel Sensing Intrinsic Amplitude: 5.6 mV
Lead Channel Setting Pacing Amplitude: 2 V
Lead Channel Setting Pacing Amplitude: 2.5 V
Lead Channel Setting Pacing Pulse Width: 0.4 ms
Lead Channel Setting Sensing Sensitivity: 4 mV
MDC IDC MSMT BATTERY IMPEDANCE: 100 Ohm
MDC IDC MSMT BATTERY REMAINING LONGEVITY: 154 mo
MDC IDC MSMT LEADCHNL RA IMPEDANCE VALUE: 550 Ohm
MDC IDC MSMT LEADCHNL RA SENSING INTR AMPL: 1.4 mV
MDC IDC MSMT LEADCHNL RV IMPEDANCE VALUE: 543 Ohm
MDC IDC STAT BRADY AP VP PERCENT: 10 %

## 2013-12-25 NOTE — Progress Notes (Signed)
PCP: [name], MD  Jordan [name] is a 80 y.o. female who presents today for routine electrophysiology followup.  Since having her pacemaker implanted, the patient reports doing reasonably well. Her family feels that she is "much better" since having her ppm implanted.  She remains quite elderly and frail.  She was in rehab briefly after PPM implant.  She continues to slowly improve. Today, she denies symptoms of palpitations, chest pain, shortness of breath,  lower extremity edema, dizziness, presyncope, or syncope.  Her primary concern is with difficulty sleeping today.  The patient is otherwise without complaint today.   Past Medical History  Diagnosis Date  . Cervicalgia   . Peripheral neuropathy   . Hypertension   . Osteoporosis   . Insomnia   . Arthritis   . Headache   . Hypothyroidism   . Right rotator cuff tear   . OA (osteoarthritis)     RIGHT SHOULDER AC JOINT  . History of syncope     01/2013  DX  CYRPTOGENIC STROKE  . Moderate dementia   . Gait instability   . First degree heart block   . History of colon polyps   . Diverticulosis of colon   . SUI (stress urinary incontinence, female)   . Lumbar stenosis   . Status post placement of implantable loop recorder     02-08-2013  . Cryptogenic stroke S/P  LOOP RECORDER IMPLANT    DX  01/2013  --  SYNCOPAL EPISODE --  EPISODIC  ATRIAL TACHY/ FIBULATION  AND PAUSES  . Frequency of urination   . Short of breath on exertion   . Chronic constipation   . Glaucoma of both eyes   . Hearing loss of both ears     no hearing aids  . Dry eyes   . Pneumonia    Past Surgical History  Procedure Laterality Date  . Loop recorder implant  02-08-13; 08-30-13    MDT LinQ implanted by Dr Lovena Le for syncope, explanted 08-30-13 after CHB identified  . Total abdominal hysterectomy w/ bilateral salpingoophorectomy  03-15-2005  . Carpal tunnel release Right   . Eye surgery      cataract, bilat.  . Pacemaker insertion  08-30-2013    MDT  ADDRL1 pacemaker implanted by Dr Rayann Heman for CHB    ROS- all systems are reviewed and negative except as per HPI above  Current Outpatient Prescriptions  Medication Sig Dispense Refill  . acetaminophen (TYLENOL) 325 MG tablet Take 650 mg by mouth every 6 (six) hours as needed for mild pain.       Marland Kitchen allopurinol (ZYLOPRIM) 100 MG tablet Take 1 tablet (100 mg total) by mouth daily.  30 tablet  12  . Calcium Carbonate-Vitamin D (CALTRATE 600+D) 600-400 MG-UNIT per tablet Take 1 tablet by mouth 2 (two) times daily.        . cloNIDine (CATAPRES) 0.1 MG tablet take 1/2 tablet by mouth twice a day  60 tablet  1  . colchicine (COLCRYS) 0.6 MG tablet Take 1 tablet (0.6 mg total) by mouth 2 (two) times daily.  60 tablet  3  . docusate sodium (COLACE) 100 MG capsule Take 1 capsule (100 mg total) by mouth daily as needed for mild constipation.  10 capsule  0  . furosemide (LASIX) 20 MG tablet Take 1 tablet (20 mg total) by mouth daily.  30 tablet  12  . gabapentin (NEURONTIN) 100 MG capsule Take 2 capsules (200 mg total) by mouth 3 (three)  times daily.  180 capsule  6  . Glucosamine-Chondroitin-Vit D3 1500-1200-800 MG-MG-UNIT PACK Take 1 tablet by mouth 2 (two) times daily.        Marland Kitchen levothyroxine (SYNTHROID, LEVOTHROID) 112 MCG tablet take 1 tablet by mouth once daily--  takes in am  30 tablet  3  . lidocaine (LIDODERM) 5 % Place 1 patch onto the skin daily. Remove & Discard patch within 12 hours or as directed by MD  30 patch  0  . mirtazapine (REMERON) 7.5 MG tablet Take 1 tablet (7.5 mg total) by mouth at bedtime.  30 tablet  2  . oxyCODONE-acetaminophen (PERCOCET/ROXICET) 5-325 MG per tablet Take 1 tablet by mouth every 8 (eight) hours as needed for severe pain.  60 tablet  0  . polyethylene glycol (MIRALAX) packet Take 17 g by mouth daily.  14 each  0  . Prenatal Vit-Fe Fumarate-FA (PREPLUS) 27-1 MG TABS TAKE ONE TABLET BY MOUTH ONCE DAILY  90 tablet  0  . RA ASPIRIN EC 81 MG EC tablet take 1 tablet by  mouth once daily  30 tablet  5  . RESTASIS 0.05 % ophthalmic emulsion Place 1 drop into both eyes daily.       . TRAVATAN Z 0.004 % SOLN ophthalmic solution Place 1 drop into both eyes at bedtime.       . traZODone (DESYREL) 50 MG tablet Take 50 mg by mouth at bedtime.      . trospium (SANCTURA) 20 MG tablet Take 20 mg by mouth 2 (two) times daily.       . VOLTAREN 1 % GEL Apply 2 g topically 3 (three) times daily. For pain       No current facility-administered medications for this visit.    Physical Exam: Filed Vitals:   12/25/13 1615  BP: 110/48  Pulse: 77  Height: 4\' 11"  (1.499 m)  Weight: 131 lb 12.8 oz (59.784 kg)    GEN- The patient is elderly appearing, alert   Head- normocephalic, atraumatic Eyes-  Sclera clear, conjunctiva pink Ears- hearing intact Oropharynx- clear Lungs- Clear to ausculation bilaterally, normal work of breathing Chest- pacemaker pocket is well healed Heart- Regular rate and rhythm, no murmurs, rubs or gallops, PMI not laterally displaced GI- soft, NT, ND, + BS Extremities- no clubbing, cyanosis, or edema  Pacemaker interrogation- reviewed in detail today,  See PACEART report  Assessment and Plan:  1. Complete heart block Normal pacemaker function See Pace Art report No changes today  2. htn She has labile BP with clonidine  No changes today Given fragility, I am reluctant to make changes today  Follow-up with Richardson Dopp in 3 months Remote monitoring Return to see me in 1 year

## 2013-12-25 NOTE — Patient Instructions (Addendum)
Your physician recommends that you schedule a follow-up appointment in: 3 months with Richardson Dopp, PA    Your physician wants you to follow-up in: 12 months with Dr Vallery Ridge will receive a reminder letter in the mail two months in advance. If you don't receive a letter, please call our office to schedule the follow-up appointment.   Remote monitoring is used to monitor your Pacemaker or ICD from home. This monitoring reduces the number of office visits required to check your device to one time per year. It allows [place] to keep an eye on the functioning of your device to ensure it is working properly. You are scheduled for a device check from home on 03/31/14. You may send your transmission at any time that day. If you have a wireless device, the transmission will be sent automatically. After your physician reviews your transmission, you will receive a postcard with your next transmission date.

## 2013-12-26 ENCOUNTER — Telehealth: Payer: Self-pay | Admitting: Family Medicine

## 2013-12-26 DIAGNOSIS — I509 Heart failure, unspecified: Secondary | ICD-10-CM | POA: Diagnosis not present

## 2013-12-26 DIAGNOSIS — R3915 Urgency of urination: Secondary | ICD-10-CM | POA: Diagnosis not present

## 2013-12-26 DIAGNOSIS — Z950 Presence of cardiac pacemaker: Secondary | ICD-10-CM | POA: Diagnosis not present

## 2013-12-26 DIAGNOSIS — I129 Hypertensive chronic kidney disease with stage 1 through stage 4 chronic kidney disease, or unspecified chronic kidney disease: Secondary | ICD-10-CM | POA: Diagnosis not present

## 2013-12-26 DIAGNOSIS — I5031 Acute diastolic (congestive) heart failure: Secondary | ICD-10-CM | POA: Diagnosis not present

## 2013-12-26 DIAGNOSIS — N183 Chronic kidney disease, stage 3 unspecified: Secondary | ICD-10-CM | POA: Diagnosis not present

## 2013-12-26 DIAGNOSIS — R350 Frequency of micturition: Secondary | ICD-10-CM | POA: Diagnosis not present

## 2013-12-26 DIAGNOSIS — J449 Chronic obstructive pulmonary disease, unspecified: Secondary | ICD-10-CM | POA: Diagnosis not present

## 2013-12-26 MED ORDER — CEPHALEXIN 500 MG PO CAPS: 500.0000 mg | ORAL_CAPSULE | Freq: Four times a day (QID) | ORAL | Status: DC

## 2013-12-26 MED ORDER — SULFAMETHOXAZOLE-TMP DS 800-160 MG PO TABS: 1.0000 | ORAL_TABLET | Freq: Two times a day (BID) | ORAL | Status: DC

## 2013-12-26 NOTE — Telephone Encounter (Signed)
Jordan [name] called and would like some called in for her urine frequency. She is in meetings all day and can not bring her mother in. I am not sure if they went to the urologist since the verbal order was given. Can someone call and talk to Jordan [name].

## 2013-12-26 NOTE — Addendum Note (Signed)
Addended by: Leone Haven on: 12/26/2013 05:48 PM   Modules accepted: Orders

## 2013-12-26 NOTE — Addendum Note (Signed)
Addended by: Leone Haven on: 12/26/2013 06:01 PM   Modules accepted: Orders

## 2013-12-26 NOTE — Telephone Encounter (Addendum)
They were seen by urology today. They rechecked her urine and told patient that she did not have an infection. They did not prescribe an antibiotic. The results of the urine culture through [facility] revealed >100000 colonies Morganella. Will treat with bactrim as it is resistant to amp, augmentin, ampicillin/sulbactam, cefazolin, macrobid. Levaquin is not an option given cardiac history. Patient is to follow-up in 2 weeks unless her symptoms do not improve.

## 2013-12-26 NOTE — Telephone Encounter (Signed)
Spoke with [employer] she went ahead and made appt for urology for today since she had not heard back from [place] (which was what Dr. Caryl Bis suggested anyways).  She request that I get results from urine culture so they will not have to do it again.  The preliminary results were in his box.  I called and spoke with the triage nurse [name]) and she is aware that I will be faxing results.  Faxed to [place], RN and [place] attention [phone].  LMOVM of Jordan [name] as she requested. [name], Jordan [name]

## 2013-12-26 NOTE — Telephone Encounter (Signed)
Jordan [name] called again and would like Jordan [name] to call her about her mother . Jordan [name]

## 2013-12-30 ENCOUNTER — Other Ambulatory Visit: Payer: Self-pay | Admitting: Family Medicine

## 2013-12-31 ENCOUNTER — Ambulatory Visit: Payer: Medicare Other | Admitting: Neurology

## 2013-12-31 DIAGNOSIS — I509 Heart failure, unspecified: Secondary | ICD-10-CM | POA: Diagnosis not present

## 2013-12-31 DIAGNOSIS — Z950 Presence of cardiac pacemaker: Secondary | ICD-10-CM | POA: Diagnosis not present

## 2013-12-31 DIAGNOSIS — J449 Chronic obstructive pulmonary disease, unspecified: Secondary | ICD-10-CM | POA: Diagnosis not present

## 2013-12-31 DIAGNOSIS — I5031 Acute diastolic (congestive) heart failure: Secondary | ICD-10-CM | POA: Diagnosis not present

## 2013-12-31 DIAGNOSIS — N183 Chronic kidney disease, stage 3 unspecified: Secondary | ICD-10-CM | POA: Diagnosis not present

## 2013-12-31 DIAGNOSIS — I129 Hypertensive chronic kidney disease with stage 1 through stage 4 chronic kidney disease, or unspecified chronic kidney disease: Secondary | ICD-10-CM | POA: Diagnosis not present

## 2014-01-02 ENCOUNTER — Telehealth: Payer: Self-pay | Admitting: Family Medicine

## 2014-01-02 DIAGNOSIS — I129 Hypertensive chronic kidney disease with stage 1 through stage 4 chronic kidney disease, or unspecified chronic kidney disease: Secondary | ICD-10-CM | POA: Diagnosis not present

## 2014-01-02 DIAGNOSIS — Z950 Presence of cardiac pacemaker: Secondary | ICD-10-CM | POA: Diagnosis not present

## 2014-01-02 DIAGNOSIS — I5031 Acute diastolic (congestive) heart failure: Secondary | ICD-10-CM | POA: Diagnosis not present

## 2014-01-02 DIAGNOSIS — N183 Chronic kidney disease, stage 3 unspecified: Secondary | ICD-10-CM | POA: Diagnosis not present

## 2014-01-02 DIAGNOSIS — I509 Heart failure, unspecified: Secondary | ICD-10-CM | POA: Diagnosis not present

## 2014-01-02 DIAGNOSIS — J449 Chronic obstructive pulmonary disease, unspecified: Secondary | ICD-10-CM | POA: Diagnosis not present

## 2014-01-02 MED ORDER — TRAZODONE HCL 50 MG PO TABS: 50.0000 mg | ORAL_TABLET | Freq: Every day | ORAL | Status: DC

## 2014-01-02 NOTE — Telephone Encounter (Signed)
Refill sent to pharmacy.   

## 2014-01-02 NOTE — Telephone Encounter (Signed)
[name] called and wanted to get a refill of her mother's Trazodone . The original prescription was written for 1/2 tab to 1 tab a night. But she is taking 1 tab a night and now is out. The pharmacy said that a new prescription should be written 1 tablet a night and called or sent in. jw

## 2014-01-03 ENCOUNTER — Ambulatory Visit: Payer: Medicare Other | Admitting: Family Medicine

## 2014-01-07 ENCOUNTER — Encounter: Payer: Self-pay | Admitting: Internal Medicine

## 2014-01-07 ENCOUNTER — Other Ambulatory Visit: Payer: Self-pay | Admitting: *Deleted

## 2014-01-07 DIAGNOSIS — I5031 Acute diastolic (congestive) heart failure: Secondary | ICD-10-CM | POA: Diagnosis not present

## 2014-01-07 DIAGNOSIS — J449 Chronic obstructive pulmonary disease, unspecified: Secondary | ICD-10-CM | POA: Diagnosis not present

## 2014-01-07 DIAGNOSIS — I129 Hypertensive chronic kidney disease with stage 1 through stage 4 chronic kidney disease, or unspecified chronic kidney disease: Secondary | ICD-10-CM | POA: Diagnosis not present

## 2014-01-07 DIAGNOSIS — N183 Chronic kidney disease, stage 3 unspecified: Secondary | ICD-10-CM | POA: Diagnosis not present

## 2014-01-07 DIAGNOSIS — I509 Heart failure, unspecified: Secondary | ICD-10-CM | POA: Diagnosis not present

## 2014-01-07 DIAGNOSIS — Z950 Presence of cardiac pacemaker: Secondary | ICD-10-CM | POA: Diagnosis not present

## 2014-01-07 MED ORDER — DICLOFENAC SODIUM 1 % TD GEL: 2.0000 g | Freq: Three times a day (TID) | TRANSDERMAL | Status: DC

## 2014-01-07 NOTE — Telephone Encounter (Signed)
Will refill voltaren gel. Patient to come to appointment 9/15 for follow-up.

## 2014-01-08 ENCOUNTER — Other Ambulatory Visit: Payer: Self-pay | Admitting: Family Medicine

## 2014-01-10 ENCOUNTER — Telehealth: Payer: Self-pay | Admitting: Family Medicine

## 2014-01-10 NOTE — Telephone Encounter (Signed)
No one called from this office.  Perhaps it the phone tree.  Attempted to call [name] but received VM. Ildefonso Keaney, [name]

## 2014-01-10 NOTE — Telephone Encounter (Signed)
[name] called because she said someone called on her mother's phone. She was wanting to know what that was about. I didn't see where we called and I think that it might have been the phone tree calling reminder of appointment. jw

## 2014-01-14 ENCOUNTER — Encounter: Payer: Self-pay | Admitting: Family Medicine

## 2014-01-14 ENCOUNTER — Ambulatory Visit (HOSPITAL_COMMUNITY)
Admission: RE | Admit: 2014-01-14 | Discharge: 2014-01-14 | Disposition: A | Discharge status: Home / Self Care | Payer: Medicare Other | Source: Ambulatory Visit | Attending: Family Medicine | Admitting: Family Medicine

## 2014-01-14 ENCOUNTER — Ambulatory Visit (INDEPENDENT_AMBULATORY_CARE_PROVIDER_SITE_OTHER): Payer: Medicare Other | Admitting: Family Medicine

## 2014-01-14 VITALS — BP 157/64 | HR 85 | Temp 97.6°F | Wt 132.0 lb

## 2014-01-14 DIAGNOSIS — I10 Essential (primary) hypertension: Secondary | ICD-10-CM | POA: Diagnosis not present

## 2014-01-14 DIAGNOSIS — M25561 Pain in right knee: Secondary | ICD-10-CM

## 2014-01-14 DIAGNOSIS — M25569 Pain in unspecified knee: Secondary | ICD-10-CM | POA: Insufficient documentation

## 2014-01-14 DIAGNOSIS — N3941 Urge incontinence: Secondary | ICD-10-CM | POA: Diagnosis not present

## 2014-01-14 DIAGNOSIS — M545 Low back pain, unspecified: Secondary | ICD-10-CM

## 2014-01-14 DIAGNOSIS — R3915 Urgency of urination: Secondary | ICD-10-CM

## 2014-01-14 DIAGNOSIS — R350 Frequency of micturition: Secondary | ICD-10-CM | POA: Diagnosis not present

## 2014-01-14 DIAGNOSIS — Z23 Encounter for immunization: Secondary | ICD-10-CM

## 2014-01-14 DIAGNOSIS — M353 Polymyalgia rheumatica: Secondary | ICD-10-CM

## 2014-01-14 LAB — POCT UA - MICROSCOPIC ONLY

## 2014-01-14 LAB — POCT URINALYSIS DIPSTICK
Bilirubin, UA: NEGATIVE
GLUCOSE UA: NEGATIVE
KETONES UA: NEGATIVE
NITRITE UA: NEGATIVE
PH UA: 6
Protein, UA: NEGATIVE
Spec Grav, UA: <=1.005
Urobilinogen, UA: 0.2

## 2014-01-14 NOTE — Patient Instructions (Signed)
Nice to see you. Please go to the hospital radiology department at your convenience to have the x-ray done.  Someone will call about the referral to the rheumatologist.

## 2014-01-15 DIAGNOSIS — M25561 Pain in right knee: Secondary | ICD-10-CM

## 2014-01-15 HISTORY — DX: Pain in right knee: M25.561

## 2014-01-15 NOTE — Assessment & Plan Note (Signed)
Continues to have issues with urgency following UTI treatment. Will recheck urine culture today to determine if treatment was effective. If no bacterial growth they will need to follow-up with urology for additional treatment discussion. Note patient has been on multiple urge incontinence medications in the past with minimal effectiveness.

## 2014-01-15 NOTE — Progress Notes (Signed)
Patient ID: Jordan [name], female   DOB: 04-18-1920, 80 y.o.   MRN: [id number]  [name], MD Phone: [phone]  Jordan [name] is a 80 y.o. female who presents today for f/u.  HYPERTENSION Disease Monitoring Home BP Monitoring checking, typically 340-352 systolic Chest pain- no    Dyspnea- no Medications Compliance-  Taking clonidine 1/2 tablet BID.  Edema- no  Urinary urgency: patient with recent UTI and has had continued urgency and frequency since finishing antibiotics. Has been on a multitude of urge incontinence medications through urology with little benefit. Denies burning at this time. Notes a few episodes of incontinence. Taking trospium though this has not helped much the past couple of weeks.  Polymyalgia rheumatica: diagnosed by patients neurologist at her last visit. They suggested to the patients daughter that the patient see rheumatology given her multiple joint pains and mildly elevated ESR. They advised the referral should be placed through the PCP. Patient does continue to have back pain and right shoulder pain. Is taking tylenol for this with infrequent percocet use. Has not had much relief of her pain with previously tried medications.  Right knee pain: present for 1.5 months. Did not injure this area. It is an achey pain. Does not radiate. No popping or locking or giving out. Has not had this pain previously.    Patient is a nonsmoker.   ROS: Per HPI   Physical Exam Filed Vitals:   01/14/14 1634  BP: 157/64  Pulse: 85  Temp: 97.6 F (36.4 C)    Gen: Well NAD HEENT: PERRL,  MMM Lungs: CTABL Nl WOB Heart: RRR no MRG Abd: soft, NT, ND MSK: right knee with no swelling, there is no erythema, there is mild tenderness of the medial joint line, there is no other tenderness, there is no ligamentous laxity, negative mcmurray test, no tenderness of the midline spine, mild bilateral lumbar paraspinous muscle tenderness Neuro: 5/5 strength in bilateral quads,  hamstrings, plantar and dorsiflexion, sensation to light touch intact in bilateral LE, absent patellar reflexes Exts: Non edematous BL  LE, warm and well perfused.    Assessment/Plan: Please see individual problem list.  # Healthcare maintenance: uptodate  [name], MD [place] PGY-3

## 2014-01-15 NOTE — Assessment & Plan Note (Signed)
At goal. Continue clonidine. F/u 3 months for this issue.

## 2014-01-15 NOTE — Assessment & Plan Note (Signed)
Patient with new onset pain. No obvious abnormalities on exam. X-ray imaging of right knee reveals degenerative changes. This is the likely cause. Patient to continue tylenol for pain. F/u if worsens.

## 2014-01-15 NOTE — Assessment & Plan Note (Addendum)
Patient with pain in multiple sites, though has pathology in shoulder, back, and now right knee to explain her pain. Neurology suggested rheumatology referral to patient and patients daughter, now patients daughter requesting this referral. Given the difficulty we have had in controlling the patients discomfort and that she has had a mildly elevated ESR in the past I think it is reasonable to refer to rheumatology to get their input on this patient. Referral placed. Will also refer for physical therapy.

## 2014-01-16 ENCOUNTER — Telehealth: Payer: Self-pay | Admitting: *Deleted

## 2014-01-16 LAB — URINE CULTURE
Colony Count: NO GROWTH
Organism ID, Bacteria: NO GROWTH

## 2014-01-16 NOTE — Telephone Encounter (Signed)
Message copied by Bobbye Riggs on Thu Jan 16, 2014  2:04 PM ------      Message from: Caryl Bis, ERIC G      Created: Thu Jan 16, 2014 12:10 PM       Patients x-ray revealed arthritis in her right knee. She should continue the tylenol. She can try over the counter capsacin cream. Please inform the patients daughter. Thanks. ------

## 2014-01-16 NOTE — Telephone Encounter (Signed)
LMOVM of Jordan [name] to call back.  Please inform of the below and that we are working on the rheumatology appt. Jordan [name], Jordan [name]

## 2014-01-20 ENCOUNTER — Telehealth: Payer: Self-pay | Admitting: *Deleted

## 2014-01-20 NOTE — Telephone Encounter (Signed)
Message copied by Bobbye Riggs on Mon Jan 20, 2014  8:34 AM ------      Message from: Caryl Bis, ERIC G      Created: Sat Jan 18, 2014  2:56 PM       Please inform the patients daughter that the urine culture did not have any infection in it. Thanks. ------

## 2014-01-20 NOTE — Telephone Encounter (Signed)
LMOVM informing patient dgt. [name], [name]

## 2014-01-24 ENCOUNTER — Other Ambulatory Visit: Payer: Self-pay | Admitting: Family Medicine

## 2014-01-28 ENCOUNTER — Telehealth: Payer: Self-pay | Admitting: *Deleted

## 2014-01-28 NOTE — Telephone Encounter (Signed)
If there are no groups in [place] that take medicaid she can be sent to [facility] or [place].

## 2014-01-28 NOTE — Telephone Encounter (Signed)
Jordan [name] with Dr. Arlean Hopping office calling re: referral we sent to them, they are not taking medicaid patients. Jordan [name]

## 2014-01-28 NOTE — Telephone Encounter (Signed)
Will forward to MD to advise.  [facility] also doesn't accept medicaid.  She was declined by [facility].  Darren Caldron,CMA

## 2014-01-29 NOTE — Telephone Encounter (Signed)
LMOVM for Jordan [name] to call back.  Please ask if she is willing to take mom to [facility] in [place]. [name], [name]

## 2014-01-30 NOTE — Telephone Encounter (Signed)
Information placed in referral.  Will forward message to MD. Jordan [name]

## 2014-01-30 NOTE — Telephone Encounter (Signed)
Spoke with [name] regarding the zorvolex (diclofenac). Advised that this is not a good medication for her 80 yo mother to take due to its effects on the kidneys and heart. She agreed with this. Patient will not take this medication.

## 2014-01-30 NOTE — Telephone Encounter (Signed)
Jordan [name] calling back, says [facility] in [place] is fine, she prefers late afternoon appointments. Daughter also wanted to let MD know she took pt to pain management doctor and was given a sample of Zorvolex 18 mg, was given 3 capsules and told if that didn't work to try 35 mg. Daughter wants to speak with MD to see what he thinks about this medication.

## 2014-02-10 ENCOUNTER — Telehealth: Payer: Self-pay | Admitting: Family Medicine

## 2014-02-10 NOTE — Telephone Encounter (Signed)
Asking to speak with RN, pt suppose to see neurologist but the doctor there is booked until end of Nov, they offered for pt to see Nurse Practitioner, they did not have a good experience the last time so daughter wants to know if pt can be referred some where else? Also, wants to be sure someone calls her cell and not home number.

## 2014-02-10 NOTE — Telephone Encounter (Signed)
Spoke with [facility] and she is concerned that [facility] will disregard patient's symptoms and concerns at next visit.  States that they saw NP while in hospital and she was didn't offer many options for patient.  Informed her November is a good time frame for an appt.  She could try [facility] but I can't promise that she would be able to get in any sooner.  She voiced understanding and will be in clinic tomorrow with pcp.  I advised daughter to give visit with NP a chance and to express her concerns with her too.  [name],CMA

## 2014-02-11 ENCOUNTER — Ambulatory Visit (INDEPENDENT_AMBULATORY_CARE_PROVIDER_SITE_OTHER): Payer: Medicare Other | Admitting: Family Medicine

## 2014-02-11 ENCOUNTER — Encounter: Payer: Self-pay | Admitting: Family Medicine

## 2014-02-11 VITALS — BP 164/65 | HR 68 | Temp 98.3°F | Wt 130.0 lb

## 2014-02-11 DIAGNOSIS — R2681 Unsteadiness on feet: Secondary | ICD-10-CM | POA: Diagnosis not present

## 2014-02-11 DIAGNOSIS — M12811 Other specific arthropathies, not elsewhere classified, right shoulder: Secondary | ICD-10-CM

## 2014-02-11 DIAGNOSIS — M12511 Traumatic arthropathy, right shoulder: Secondary | ICD-10-CM

## 2014-02-11 DIAGNOSIS — M75101 Unspecified rotator cuff tear or rupture of right shoulder, not specified as traumatic: Principal | ICD-10-CM

## 2014-02-11 MED ORDER — DICLOFENAC SODIUM 1 % TD GEL: 2.0000 g | Freq: Four times a day (QID) | TRANSDERMAL | Status: DC | PRN

## 2014-02-11 NOTE — Patient Instructions (Signed)
Nice to see you. Please contact the orthopedic surgeon to get their opinion on your shoulder.  Please schedule an appointment in the geriatrics clinic for some time in the next month.

## 2014-02-11 NOTE — Progress Notes (Signed)
Patient ID: Jordan [name], female   DOB: August 06, 1919, 80 y.o.   MRN: [id number]  Jordan Rumps, MD Phone: [phone]  [name] is a 80 y.o. female who presents today for f/u.  Right shoulder pain: notes she continues to have pain with most any movement. She recently had an injection in her shoulder that only lasted a few days. She has been using voltaren TID and tylenol TID. She takes percocet infrequently as this is too strong. She has not seen ortho since her shoulder injection.  Patient also reports an over all weakness and unsteadiness when walking. She notes she is able to stand up on her own, though this is difficult as she feels as though she has little strength in her legs and arms. She notes she uses a walker at home and wheel chair when they are out. She was previously seen by PT and notes this helped some. She denies dizziness with this and has not had any falls.   Patient is a nonsmoker.   ROS: Per HPI   Physical Exam Filed Vitals:   02/11/14 1550  BP: 164/65  Pulse: 68  Temp: 98.3 F (36.8 C)    Gen: Well NAD Lungs: CTABL Nl WOB Heart: RRR no MRG MSK: patient able to stand from sitting with no assistance, she is unable to abduct her right arm due to pain, there is pain with passive abduction of the right arm and the patient resists attempts at full abduction, there is mild tenderness to palpation of the lateral aspect of the shoulder Patient is able to move from a sitting position to standing with no assistance Exts: Non edematous BL  LE, warm and well perfused.    Assessment/Plan: Please see individual problem list.  # Healthcare maintenance: up to date  Jordan Rumps, MD [place] PGY-3

## 2014-02-12 ENCOUNTER — Telehealth: Payer: Self-pay | Admitting: Family Medicine

## 2014-02-12 DIAGNOSIS — N3941 Urge incontinence: Secondary | ICD-10-CM | POA: Diagnosis not present

## 2014-02-12 DIAGNOSIS — R350 Frequency of micturition: Secondary | ICD-10-CM | POA: Diagnosis not present

## 2014-02-12 DIAGNOSIS — R351 Nocturia: Secondary | ICD-10-CM | POA: Diagnosis not present

## 2014-02-12 NOTE — Assessment & Plan Note (Addendum)
Patient previously scheduled to have surgery for this issue, though developed 3rd degree heart block in the time leading up to this and did not have surgery. She has been on a variety of medications and now has had an inject that was not helpful. PT not beneficial either. I suspect that the only beneficial treatment for her at this time would be surgery. Given her cardiac issues it would be pertinent for her to be evaluated by cardiology prior to this surgery. I have advised the patient and her son to see her orthopedist to discuss options moving forward.

## 2014-02-12 NOTE — Assessment & Plan Note (Addendum)
Continues to have issues with gait instability even with walker use. Given that this did not appreciably improve with physical therapy I would like for her to be seen in our geriatrics clinic for further evaluation of her gait for potential other therapies or resources that would be beneficial to the patient.

## 2014-02-12 NOTE — Telephone Encounter (Signed)
[name] called and would like Janett Billow or [name] to call her concerning her mother. Blima Rich

## 2014-02-12 NOTE — Telephone Encounter (Signed)
Daughter just wanted to clarify instructions from Dr. Caryl Bis from yesterday. [name] will need to call orthopedic and schedule a follow up appt.  Also gave her date for geri appt with Dr. McDiarmid.  [name],CMA

## 2014-03-10 ENCOUNTER — Other Ambulatory Visit: Payer: Self-pay | Admitting: Family Medicine

## 2014-03-11 ENCOUNTER — Ambulatory Visit: Payer: Medicare Other

## 2014-03-25 ENCOUNTER — Ambulatory Visit: Payer: Medicare Other | Admitting: Physician Assistant

## 2014-03-31 ENCOUNTER — Encounter: Payer: Medicare Other | Admitting: *Deleted

## 2014-03-31 ENCOUNTER — Telehealth: Payer: Self-pay | Admitting: Cardiology

## 2014-03-31 NOTE — Telephone Encounter (Signed)
LMOVM reminding pt to send remote transmission.   

## 2014-04-04 ENCOUNTER — Encounter: Payer: Self-pay | Admitting: Cardiology

## 2014-04-10 ENCOUNTER — Encounter (HOSPITAL_COMMUNITY): Payer: Self-pay | Admitting: Internal Medicine

## 2014-04-16 ENCOUNTER — Ambulatory Visit (INDEPENDENT_AMBULATORY_CARE_PROVIDER_SITE_OTHER): Payer: Medicare Other | Admitting: Physician Assistant

## 2014-04-16 ENCOUNTER — Encounter: Payer: Self-pay | Admitting: Physician Assistant

## 2014-04-16 ENCOUNTER — Ambulatory Visit (INDEPENDENT_AMBULATORY_CARE_PROVIDER_SITE_OTHER): Payer: Medicare Other | Admitting: *Deleted

## 2014-04-16 VITALS — BP 138/58 | HR 66 | Ht 59.0 in | Wt 136.0 lb

## 2014-04-16 DIAGNOSIS — I10 Essential (primary) hypertension: Secondary | ICD-10-CM | POA: Diagnosis not present

## 2014-04-16 DIAGNOSIS — N183 Chronic kidney disease, stage 3 unspecified: Secondary | ICD-10-CM

## 2014-04-16 DIAGNOSIS — I5032 Chronic diastolic (congestive) heart failure: Secondary | ICD-10-CM

## 2014-04-16 DIAGNOSIS — I442 Atrioventricular block, complete: Secondary | ICD-10-CM

## 2014-04-16 DIAGNOSIS — Z950 Presence of cardiac pacemaker: Secondary | ICD-10-CM | POA: Diagnosis not present

## 2014-04-16 DIAGNOSIS — I639 Cerebral infarction, unspecified: Secondary | ICD-10-CM

## 2014-04-16 DIAGNOSIS — I441 Atrioventricular block, second degree: Secondary | ICD-10-CM

## 2014-04-16 LAB — MDC_IDC_ENUM_SESS_TYPE_INCLINIC
Battery Remaining Longevity: 137 mo
Battery Voltage: 2.79 V
Brady Statistic AP VP Percent: 11 %
Brady Statistic AS VP Percent: 89 %
Lead Channel Pacing Threshold Amplitude: 0.5 V
Lead Channel Pacing Threshold Amplitude: 0.5 V
Lead Channel Pacing Threshold Pulse Width: 0.4 ms
Lead Channel Sensing Intrinsic Amplitude: 5.6 mV
Lead Channel Setting Pacing Amplitude: 2 V
Lead Channel Setting Pacing Pulse Width: 0.4 ms
MDC IDC MSMT BATTERY IMPEDANCE: 100 Ohm
MDC IDC MSMT LEADCHNL RA IMPEDANCE VALUE: 502 Ohm
MDC IDC MSMT LEADCHNL RA SENSING INTR AMPL: 1.4 mV
MDC IDC MSMT LEADCHNL RV IMPEDANCE VALUE: 533 Ohm
MDC IDC MSMT LEADCHNL RV PACING THRESHOLD PULSEWIDTH: 0.4 ms
MDC IDC SESS DTM: 20151216160335
MDC IDC SET LEADCHNL RV PACING AMPLITUDE: 2.5 V
MDC IDC SET LEADCHNL RV SENSING SENSITIVITY: 4 mV
MDC IDC STAT BRADY AP VS PERCENT: 0 %
MDC IDC STAT BRADY AS VS PERCENT: 0 %

## 2014-04-16 NOTE — Patient Instructions (Signed)
LAB WORK TOMORROW AT YOUR APPT; BMET  Your physician wants you to follow-up in: 08/2014 WITH [facility], PAC. You will receive a reminder letter in the mail two months in advance. If you don't receive a letter, please call our office to schedule the follow-up appointment.

## 2014-04-16 NOTE — Progress Notes (Signed)
Cardiology Office Note   Date:  04/16/2014   ID:  DURENE FOYT, DOB 1920-04-02, MRN [id number]  PCP:  [name], MD  Cardiologist/Electrophysiologist:  Dr. [name]     History of Present Illness: Jordan [name] is a 80 y.o. female with a hx of HTN, COPD, CKD, DJD, unexplained syncope s/p ILR.She was admitted 07/2013 with symptomatic Mobitz II 2nd degree HB and underwent implantation of a Medtronic Adapta L dual-chamber pacemaker by Dr. [name] on 08/30/13.  Hospitalization was c/b a/c diastolic CHF.  Last seen by Dr. [name] in 11/2013.  She was noted to have labile BP but no changes were made due to fragility and gait instability.     She returns for FU with her son.  She is seen with the assistance of an interpreter.  She denies chest pain or significant dyspnea.  She denies orthopnea, PND, edema.    Studies: - Echo (08/30/13): Mild LVH, EF 60-65%, normal wall motion, grade 1 diastolic dysfunction, trivial AI, MAC, trivial MR, moderate LAE, mild RAE - Carotid US (01/2013): Bilateral ICA 1-39%  Recent Labs: 08/30/2013: ALT 93* 09/02/2013: Pro B Natriuretic peptide (BNP) 10568.0* 11/08/2013: TSH 0.041* 11/13/2013: BUN 47*; Creatinine 1.29*; Hemoglobin 12.0; Potassium 4.4; Sodium 134*    Recent Radiology: No results found.    Wt Readings from Last 3 Encounters:  04/16/14 136 lb (61.689 kg)  02/11/14 130 lb (58.968 kg)  01/14/14 132 lb (59.875 kg)     Past Medical History  Diagnosis Date  . Cervicalgia   . Peripheral neuropathy   . Hypertension   . Osteoporosis   . Insomnia   . Arthritis   . Headache   . Hypothyroidism   . Right rotator cuff tear   . OA (osteoarthritis)     RIGHT SHOULDER AC JOINT  . Moderate dementia   . Gait instability   . Complete heart block   . History of colon polyps   . Diverticulosis of colon   . SUI (stress urinary incontinence, female)   . Lumbar stenosis   . Cryptogenic stroke S/P  LOOP RECORDER IMPLANT    DX  01/2013   --  SYNCOPAL EPISODE --  EPISODIC  ATRIAL TACHY/ FIBULATION  AND PAUSES  . Frequency of urination   . Chronic constipation   . Glaucoma of both eyes   . Hearing loss of both ears     no hearing aids  . Dry eyes     Current Outpatient Prescriptions  Medication Sig Dispense Refill  . acetaminophen (TYLENOL) 325 MG tablet Take 650 mg by mouth every 6 (six) hours as needed for mild pain.     Marland Kitchen allopurinol (ZYLOPRIM) 100 MG tablet Take 1 tablet (100 mg total) by mouth daily. 30 tablet 12  . Calcium Carbonate-Vitamin D (CALTRATE 600+D) 600-400 MG-UNIT per tablet Take 1 tablet by mouth 2 (two) times daily.      . cloNIDine (CATAPRES) 0.1 MG tablet take 1/2 tablet by mouth twice a day 60 tablet 1  . colchicine (COLCRYS) 0.6 MG tablet Take 1 tablet (0.6 mg total) by mouth 2 (two) times daily. 60 tablet 3  . diclofenac sodium (VOLTAREN) 1 % GEL Apply 2 g topically 4 (four) times daily as needed (pain). 100 g 0  . furosemide (LASIX) 20 MG tablet Take 1 tablet (20 mg total) by mouth daily. 30 tablet 12  . gabapentin (NEURONTIN) 100 MG capsule Take 2 capsules (200 mg total) by mouth 3 (three)  times daily. 180 capsule 6  . Glucosamine-Chondroitin-Vit D3 1500-1200-800 MG-MG-UNIT PACK Take 1 tablet by mouth 2 (two) times daily.      Marland Kitchen levothyroxine (SYNTHROID, LEVOTHROID) 112 MCG tablet take 1 tablet by mouth every morning ON AN EMPTY STOMACH 30 tablet 3  . lidocaine (LIDODERM) 5 % Place 1 patch onto the skin daily. Remove & Discard patch within 12 hours or as directed by MD 30 patch 0  . mirtazapine (REMERON) 7.5 MG tablet take 1 tablet by mouth at bedtime 30 tablet 2  . oxyCODONE-acetaminophen (PERCOCET/ROXICET) 5-325 MG per tablet Take 1 tablet by mouth every 8 (eight) hours as needed for severe pain. 60 tablet 0  . polyethylene glycol (MIRALAX) packet Take 17 g by mouth daily. 14 each 0  . Prenatal Vit-Fe Fumarate-FA (PREPLUS) 27-1 MG TABS TAKE ONE TABLET BY MOUTH ONCE DAILY 90 tablet 0  . RA ASPIRIN  EC 81 MG EC tablet take 1 tablet by mouth once daily 30 tablet 5  . RESTASIS 0.05 % ophthalmic emulsion Place 1 drop into both eyes daily.     . TOVIAZ 8 MG TB24 tablet Take 8 mg by mouth daily.   0  . TRAVATAN Z 0.004 % SOLN ophthalmic solution Place 1 drop into both eyes at bedtime.     . traZODone (DESYREL) 50 MG tablet Take 1 tablet (50 mg total) by mouth at bedtime. 30 tablet 3   No current facility-administered medications for this visit.     Allergies:   Amlodipine; Chlorthalidone; Verapamil; Adhesive; and Honey bee treatment   Social History:  The patient  reports that she has never smoked. She has never used smokeless tobacco. She reports that she does not drink alcohol or use illicit drugs.   Family History:  The patient's family history includes Kidney disease in her mother. There is no history of Heart attack or Stroke.    ROS:  Please see the history of present illness.   She complains of dry mouth.  She also notes L shoulder pain that is chronic.   All other systems reviewed and negative.    PHYSICAL EXAM: VS:  BP 138/58 mmHg  Pulse 66  Ht 4\' 11"  (1.499 m)  Wt 136 lb (61.689 kg)  BMI 27.45 kg/m2  SpO2 94% Well nourished, well developed, in no acute distress HEENT: normal Neck:  No  JVDat 90 degrees Cardiac:  normal S1, S2;  RRR;  no murmur   Lungs:   clear to auscultation bilaterally, no wheezing, rhonchi or rales Abd: soft, nontender, no hepatomegaly Ext:  no edema Skin: warm and dry Neuro:  CNs 2-12 intact, no focal abnormalities noted      ASSESSMENT AND PLAN:   1.  Chronic Diastolic CHF:  Volume stable.  Continue current dose of Lasix.    -  Arrange FU BMET. 2.  2nd Degree AVB Type 2 s/p PPM:  Pacer interrogated today.  FU with Dr. [name] as planned.  3.  Hypertension:  Controlled.  4.  Chronic Kidney Disease:  Arrange FU BMET.   Disposition:   FU with me in April 2016 and Dr. [name] in 12/2014.    Signed, Versie Starks,  MHS 04/16/2014 4:00 PM    [facility] [address], [place], [place]  [address] Phone: [phone]; Fax: [phone]

## 2014-04-16 NOTE — Progress Notes (Signed)
Pacemaker check in clinic. Normal device function. Thresholds, sensing, impedances consistent with previous measurements. Device programmed to maximize longevity. 12 mode switches max A 156 bpm, + ASA 81 mg.  Total burden <0.1%.  No high ventricular rates noted. Device programmed at appropriate safety margins. Histogram distribution appropriate for patient activity level. Device programmed to optimize intrinsic conduction. Estimated longevity 11.5 years. Patient enrolled in remote follow-up/TTM's with [employer]. Plan to follow every 3 months remotely and see annually in office. Patient education completed.  [facility] 07/16/14.

## 2014-04-17 ENCOUNTER — Encounter: Payer: Self-pay | Admitting: Family Medicine

## 2014-04-17 ENCOUNTER — Ambulatory Visit (INDEPENDENT_AMBULATORY_CARE_PROVIDER_SITE_OTHER): Payer: Medicare Other | Admitting: Family Medicine

## 2014-04-17 VITALS — BP 127/64 | HR 64 | Ht 59.0 in | Wt 132.0 lb

## 2014-04-17 DIAGNOSIS — Z7382 Dual sensory impairment: Secondary | ICD-10-CM

## 2014-04-17 DIAGNOSIS — M12811 Other specific arthropathies, not elsewhere classified, right shoulder: Secondary | ICD-10-CM

## 2014-04-17 DIAGNOSIS — I639 Cerebral infarction, unspecified: Secondary | ICD-10-CM

## 2014-04-17 DIAGNOSIS — R2681 Unsteadiness on feet: Secondary | ICD-10-CM

## 2014-04-17 DIAGNOSIS — H3530 Unspecified macular degeneration: Secondary | ICD-10-CM | POA: Diagnosis not present

## 2014-04-17 DIAGNOSIS — M12511 Traumatic arthropathy, right shoulder: Secondary | ICD-10-CM | POA: Diagnosis not present

## 2014-04-17 DIAGNOSIS — R531 Weakness: Secondary | ICD-10-CM | POA: Diagnosis not present

## 2014-04-17 DIAGNOSIS — H9193 Unspecified hearing loss, bilateral: Secondary | ICD-10-CM | POA: Diagnosis not present

## 2014-04-17 DIAGNOSIS — H547 Unspecified visual loss: Secondary | ICD-10-CM

## 2014-04-17 DIAGNOSIS — G309 Alzheimer's disease, unspecified: Secondary | ICD-10-CM

## 2014-04-17 DIAGNOSIS — Z79899 Other long term (current) drug therapy: Secondary | ICD-10-CM | POA: Diagnosis not present

## 2014-04-17 DIAGNOSIS — F0280 Dementia in other diseases classified elsewhere without behavioral disturbance: Secondary | ICD-10-CM

## 2014-04-17 DIAGNOSIS — H409 Unspecified glaucoma: Secondary | ICD-10-CM

## 2014-04-17 DIAGNOSIS — H3531 Nonexudative age-related macular degeneration: Secondary | ICD-10-CM

## 2014-04-17 DIAGNOSIS — M75101 Unspecified rotator cuff tear or rupture of right shoulder, not specified as traumatic: Secondary | ICD-10-CM

## 2014-04-17 DIAGNOSIS — M810 Age-related osteoporosis without current pathological fracture: Secondary | ICD-10-CM | POA: Diagnosis not present

## 2014-04-17 DIAGNOSIS — H9190 Unspecified hearing loss, unspecified ear: Secondary | ICD-10-CM

## 2014-04-17 DIAGNOSIS — H353190 Nonexudative age-related macular degeneration, unspecified eye, stage unspecified: Secondary | ICD-10-CM

## 2014-04-17 DIAGNOSIS — E038 Other specified hypothyroidism: Secondary | ICD-10-CM

## 2014-04-17 MED ORDER — LIDOCAINE 5 % EX PTCH: 1.0000 | MEDICATED_PATCH | CUTANEOUS | Status: DC

## 2014-04-17 MED ORDER — OCUVITE PO TABS: 1.0000 | ORAL_TABLET | Freq: Every day | ORAL | Status: DC

## 2014-04-18 ENCOUNTER — Telehealth: Payer: Self-pay | Admitting: Family Medicine

## 2014-04-18 ENCOUNTER — Encounter: Payer: Self-pay | Admitting: Family Medicine

## 2014-04-18 DIAGNOSIS — H3530 Unspecified macular degeneration: Secondary | ICD-10-CM | POA: Insufficient documentation

## 2014-04-18 DIAGNOSIS — H409 Unspecified glaucoma: Secondary | ICD-10-CM

## 2014-04-18 DIAGNOSIS — H353190 Nonexudative age-related macular degeneration, unspecified eye, stage unspecified: Secondary | ICD-10-CM

## 2014-04-18 HISTORY — DX: Unspecified glaucoma: H40.9

## 2014-04-18 HISTORY — DX: Nonexudative age-related macular degeneration, unspecified eye, stage unspecified: H35.3190

## 2014-04-18 LAB — BASIC METABOLIC PANEL
BUN: 34 mg/dL — AB (ref 6–23)
CO2: 24 mEq/L (ref 19–32)
CREATININE: 1.07 mg/dL (ref 0.50–1.10)
Calcium: 9.7 mg/dL (ref 8.4–10.5)
Chloride: 101 mEq/L (ref 96–112)
GLUCOSE: 89 mg/dL (ref 70–99)
Potassium: 4.3 mEq/L (ref 3.5–5.3)
Sodium: 138 mEq/L (ref 135–145)

## 2014-04-18 LAB — VITAMIN D 25 HYDROXY (VIT D DEFICIENCY, FRACTURES): VIT D 25 HYDROXY: 47 ng/mL (ref 30–100)

## 2014-04-18 LAB — SEDIMENTATION RATE: Sed Rate: 38 mm/hr — ABNORMAL HIGH (ref 0–22)

## 2014-04-18 NOTE — Progress Notes (Signed)
Patient ID: Jordan [name], female   DOB: 06-25-1919, 80 y.o.   MRN: [id number] [facility]:   Patient is accompanied by: patient and daughter, and Spanish interpreter, Clarissa from [employer] Primary caregiver: son, daughter and adult caretaker (personal care service aid) History obtained from: patient, dgt, EMR Primary Care Provider: Cornelius Moras, MD Patient referred to [facility] by Dr Caryl Bis Reason for Referral: Gait Instability History   HPI by problems:   Chronic Right Shoulder pain - Longstanding issue, Musculoskeletal aching pain with stiffness. - Pt unable to abduct right shoulder.  Right arm feels "heavy" - full rotator cuff tear on MRI - deemed no surgical candidate by ortho after placement of pacemaker. - Only temporary relief, few days,  from subacromial injection corticosteroids. - On scheduled APAP. Not able take NSAIDs secondary to CKD - Topical Voltaren provides small amount of pain relief.  - Intermittent use of  Oxycodone-APAP 5/325 few times a week.  Helps transiently some with pain. [name] administers the medication only for severe pain.  It makes patient sleepy but no confusion.  Pt with longstanding constipation problem even without taking opiate.  - Moderately effective response to Lidoderm patches to shoulder but has been unable to get because needs Prior Approval by Medicaid.  Gait Instability - Longstanding issue. - Unable to transfer from sit to stand without moderate assistance. - Uses Walker to ambulate in home and manual WC for outdoors.  - No falls in last six months.  - Fall Risk Factors:  Impaired vision from macular degeneration and glaucoma,more than 5 medications, clonidine, mirtazipine, axial and peripheral osteoarthritis, lumbar spinal stenosis, peripheral neuropathy, mixed urinary incontinence, dementia, persistent pain  .    Outpatient Encounter Prescriptions as of 04/17/2014  Medication Sig  .  acetaminophen (TYLENOL) 325 MG tablet Take 650 mg by mouth every 6 (six) hours as needed for mild pain.   Marland Kitchen allopurinol (ZYLOPRIM) 100 MG tablet Take 1 tablet (100 mg total) by mouth daily.  . beta carotene w/minerals (OCUVITE) tablet Take 1 tablet by mouth daily.  . Calcium Carbonate-Vitamin D (CALTRATE 600+D) 600-400 MG-UNIT per tablet Take 1 tablet by mouth 2 (two) times daily.    . cloNIDine (CATAPRES) 0.1 MG tablet take 1/2 tablet by mouth twice a day  . colchicine (COLCRYS) 0.6 MG tablet Take 1 tablet (0.6 mg total) by mouth 2 (two) times daily.  . diclofenac sodium (VOLTAREN) 1 % GEL Apply 2 g topically 4 (four) times daily as needed (pain).  . furosemide (LASIX) 20 MG tablet Take 1 tablet (20 mg total) by mouth daily.  Marland Kitchen gabapentin (NEURONTIN) 100 MG capsule Take 2 capsules (200 mg total) by mouth 3 (three) times daily.  . Glucosamine-Chondroitin-Vit D3 1500-1200-800 MG-MG-UNIT PACK Take 1 tablet by mouth 2 (two) times daily.    Marland Kitchen levothyroxine (SYNTHROID, LEVOTHROID) 112 MCG tablet take 1 tablet by mouth every morning ON AN EMPTY STOMACH  . lidocaine (LIDODERM) 5 % Place 1 patch onto the skin daily. Remove & Discard patch within 12 hours or as directed by MD  . lidocaine (LIDODERM) 5 % Place 1 patch onto the skin daily. Remove & Discard patch within 12 hours or as directed by MD  . mirtazapine (REMERON) 7.5 MG tablet take 1 tablet by mouth at bedtime  . oxyCODONE-acetaminophen (PERCOCET/ROXICET) 5-325 MG per tablet Take 1 tablet by mouth every 8 (eight) hours as needed for severe pain.  . polyethylene glycol (MIRALAX) packet Take 17 g by mouth  daily.  . Prenatal Vit-Fe Fumarate-FA (PREPLUS) 27-1 MG TABS TAKE ONE TABLET BY MOUTH ONCE DAILY  . RA ASPIRIN EC 81 MG EC tablet take 1 tablet by mouth once daily  . RESTASIS 0.05 % ophthalmic emulsion Place 1 drop into both eyes daily.   . TOVIAZ 8 MG TB24 tablet Take 8 mg by mouth daily.   . TRAVATAN Z 0.004 % SOLN ophthalmic solution Place 1  drop into both eyes at bedtime.   . traZODone (DESYREL) 50 MG tablet Take 1 tablet (50 mg total) by mouth at bedtime.   History Patient Active Problem List   Diagnosis Date Noted  . Combined visual hearing impairment 11/12/2013  . Gout 09/21/2013  . Persistent headaches 09/06/2013  . 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 CKD (chronic kidney disease) stage 3, GFR 30-59 ml/min 09/03/2013  . Chronic diastolic heart failure 96/08/5407  . Weakness 08/29/2013  . Mobitz type 2 second degree heart block 08/29/2013  . Constipation 08/23/2013  . Headache(784.0) 08/18/2013  . Alzheimer's dementia 07/30/2013  . Spinal stenosis of lumbar region 10/03/2012  . Rotator cuff tear arthropathy 09/11/2012  . Gait instability 08/28/2012  . Insomnia 06/14/2010  . MASS, LUNG 04/05/2010  . PERIPHERAL NEUROPATHY 03/29/2010  . INTERSTITIAL CYSTITIS 09/21/2009  . ACCIDENTAL FALL, HX OF 12/15/2008  . OSTEOPOROSIS 03/20/2008  . HERNIA, INCISIONAL VENTRAL W/O OBST/GNGR 10/25/2006  . HYPOTHYROIDISM, UNSPECIFIED 06/29/2006  . DEPRESSIVE DISORDER, NOS 06/29/2006  . CARPAL TUNNEL SYNDROME 06/29/2006  . HTN (hypertension) 06/29/2006  . CYSTOCELE/RECTOCELE/PROLAPSE,UNSPEC. 06/29/2006  . Urinary incontinence, mixed 06/29/2006  . OSTEOARTHRITIS, MULTI SITES 06/29/2006   Past Medical History  Diagnosis Date  . Cervicalgia   . Peripheral neuropathy   . Hypertension   . Osteoporosis   . Insomnia   . Arthritis   . Headache   . Hypothyroidism   . Right rotator cuff tear   . OA (osteoarthritis)     RIGHT SHOULDER AC JOINT  . Moderate dementia   . Gait instability   . Complete heart block   . History of colon polyps   . Diverticulosis of colon   . SUI (stress urinary incontinence, female)   . Lumbar stenosis   . Cryptogenic stroke S/P  LOOP RECORDER IMPLANT    DX  01/2013  --  SYNCOPAL EPISODE --  EPISODIC  ATRIAL TACHY/ FIBULATION  AND PAUSES  .  Frequency of urination   . Chronic constipation   . Glaucoma of both eyes   . Hearing loss of both ears     no hearing aids  . Dry eyes   . Cervicalgia 05/18/2009    Qualifier: Diagnosis of  By: Walker Kehr MD, Patrick Jupiter    . DIVERTICULOSIS OF COLON 06/29/2006    Qualifier: Diagnosis of  By: Benna Dunks    . Combined visual hearing impairment 11/12/2013  . Urinary incontinence, mixed 06/29/2006    Qualifier: Diagnosis of  By: Benna Dunks    . INTERSTITIAL CYSTITIS 09/21/2009    Qualifier: Diagnosis of  By: Sarita Haver  MD, Coralyn Mark    . Syncope and collapse 02/09/2013  . Right knee pain 01/15/2014  . Polymyalgia rheumatica 11/08/2013  . Loss of weight 04/05/2010    Qualifier: Diagnosis of  By: Walker Kehr MD, Patrick Jupiter     Past Surgical History  Procedure Laterality Date  . Loop recorder implant  02-08-13; 08-30-13    MDT LinQ implanted by Dr Lovena Le for syncope, explanted 08-30-13 after CHB identified  .  Total abdominal hysterectomy w/ bilateral salpingoophorectomy  03-15-2005  . Carpal tunnel release Right   . Eye surgery      cataract, bilat.  . Pacemaker insertion  08-30-2013    MDT ADDRL1 pacemaker implanted by Dr Rayann Heman for CHB  . Loop recorder implant N/A 02/11/2013    Procedure: LOOP RECORDER IMPLANT;  Surgeon: Evans Lance, MD;  Location[facility] CATH LAB;  Service: Cardiovascular;  Laterality: N/A;  . Permanent pacemaker insertion N/A 08/30/2013    Procedure: PERMANENT PACEMAKER INSERTION;  Surgeon: Coralyn Mark, MD;  Location: [facility] [place] CATH LAB;  Service: Cardiovascular;  Laterality: N/A;   Family History  Problem Relation Age of Onset  . Kidney disease Mother   . Heart attack Neg Hx   . Stroke Neg Hx    History   Social History  . Marital Status: Widowed    Spouse Name: N/A    Number of Children: N/A  . Years of Education: N/A   Social History Main Topics  . Smoking status: Never Smoker   . Smokeless tobacco: Never Used  . Alcohol Use: No  . Drug Use: No  . Sexual Activity: No   Other Topics  Concern  . None   Social History Narrative   lives with son, [place]; No tob, etoh.    Has three total children. 1 g'child.;    Dtr [name] (w) [phone], (h) [phone]; dtr [place]. Daughters usually interpret for her at visits and are very involved and ask lots of questions.     Basic Activities of Daily Living and Instrumental Activities of Daily Living Patient is dependent in eating, bathing, toileting, personal cares, ambulating, grooming, hygiene, dressing upper body, dressing lower body, meal preparation and taking own medications  Caregiver Burdens: family able to provide 35 / 55 care between patient's son, dgt, grandchildren, and PCS aid)   Falls in the past six months:   no  Health Maintenance reviewed: Immunization History  Administered Date(s) Administered  . Influenza Split 01/24/2012  . Influenza Whole 03/05/2007, 02/22/2010  . Influenza,inj,Quad PF,36+ Mos 02/10/2013, 01/14/2014  . Pneumococcal Polysaccharide-23 01/30/1997  . Td 03/03/1999, 05/18/2009     Diet: Regular Nutritional supplements: none  ROS (+) decrease weight;  (+) vision / hearing difficulty Denies chest pain; denies heart beating slower/thumps inside chest; denies racing heart/flutter;  Denies dysuria (+) constipation; (+) intermittent  diarrhea;  Denies N/V Denies recent falls Denies clumsiness; denies tremors; Denies muscle rigidity  Vital Signs Weight: 132 lb (59.875 kg) Body mass index is 26.65 kg/(m^2). Estimated Creatinine Clearance: 26.4 mL/min (by C-G formula based on Cr of 1.07). Body surface area is 1.58 meters squared. Filed Vitals:   04/17/14 1559  BP: 127/64  Pulse: 64  Height: '4\' 11"'  (1.499 m)  Weight: 132 lb (59.875 kg)   Wt Readings from Last 3 Encounters:  04/17/14 132 lb (59.875 kg)  04/16/14 136 lb (61.689 kg)  02/11/14 130 lb (58.968 kg)   Sweep Audiometry, office: unable to hear at 40 db threshold in 500, 1000, 2000, 4000 Hz in either ear.  Vision (Snellen  office chart) : OU 20 / 80; OS 20/120; OD 20/120  Physical Examination:  VS reviewed GEN: Alert, Cooperative, Groomed, NAD HEENT: PERRL; EAC bilaterally not occluded, normal LM, (+) LR;                No cervical LAN, No thyromegaly, No palpable masses COR: RRR, No Systolic ejection murmur LUNGS: BCTA, No Acc mm use, speaking in full  sentences EXT: No peripheral leg edema. Feet without deformity or lesions. trace bilateral pedal pulses.  SKIN: No lesion nor rashes of face/trunk/extremities Neuro: Alert, cooperative; oriented to person, place, and time;  CN III - XII grossly intact Motor: RUE unable abduct should, 4/5 right elbow flex/ext, grip 4+/5            LUE 5/5 shoulder abduction, elbow flex/ext, grip Tone: Normal muscle tone in arms and legs.   Tremor: No tremor DTR: no hyperreflexia.  No Clonus.  Babinski down going bilaterally    Sit-to-Stand Test: Unable to rise from seated position to standing without moderate one person assistance  4-Stage Stand Test:  Feet Side-by-side: No.   Retropulsion present  Psych: Bright affect, Speech easily interpreted by interpreter.   Montreal Cognitive Assessment-BLIND (SPANISH)  04/17/2014  Visuospatial/ Executive (0/5) (No Data)  Naming (0/3) (No Data)  Attention: Read list of digits (0/2) 0  Attention: Read list of letters (0/1) 0  Attention: Serial 7 subtraction starting at 100 (0/3) 1  Language: Repeat phrase (0/2) 0  Language : Fluency (0/1) 0  Abstraction (0/2) 1  Delayed Recall (0/5) 0  Orientation (0/6) 6   MoCA - Blind (Spanish version); 9 out of 22.  Correct MoCA = 12 out of 30.       Labs  Lab Results  Component Value Date   FIEPPIRJ18 841 11/16/2010    Lab Results  Component Value Date   FOLATE >20.0 ng/mL 03/21/2008    Lab Results  Component Value Date   TSH 0.041* 11/08/2013    No results found for: RPR    Chemistry      Component Value Date/Time   NA 138 04/17/2014 1651   K 4.3 04/17/2014  1651   CL 101 04/17/2014 1651   CO2 24 04/17/2014 1651   BUN 34* 04/17/2014 1651   CREATININE 1.07 04/17/2014 1651   CREATININE 1.02 09/04/2013 0552      Component Value Date/Time   CALCIUM 9.7 04/17/2014 1651   ALKPHOS 101 08/30/2013 0504   AST 43* 08/30/2013 0504   ALT 93* 08/30/2013 0504   BILITOT 0.4 08/30/2013 0504       Lab Results  Component Value Date   HGBA1C  07/01/2010    5.6 (NOTE)                                                                       According to the ADA Clinical Practice Recommendations for 2011, when HbA1c is used as a screening test:   >=6.5%   Diagnostic of Diabetes Mellitus           (if abnormal result  is confirmed)  5.7-6.4%   Increased risk of developing Diabetes Mellitus  References:Diagnosis and Classification of Diabetes Mellitus,Diabetes YSAY,3016,01(UXNAT 1):S62-S69 and Standards of Medical Care in         Diabetes - 2011,Diabetes Care,2011,34  (Suppl 1):S11-S61.      Lab Results  Component Value Date   WBC 12.7* 11/13/2013   HGB 12.0 11/13/2013   HCT 35.3* 11/13/2013   MCV 92.4 11/13/2013   PLT 244 11/13/2013    Results for orders placed or performed in visit on 04/17/14 (from the past 24 hour(s))  Basic Metabolic  Panel   Collection Time: 04/17/14  4:51 PM  Result Value Ref Range   Sodium 138 135 - 145 mEq/L   Potassium 4.3 3.5 - 5.3 mEq/L   Chloride 101 96 - 112 mEq/L   CO2 24 19 - 32 mEq/L   Glucose, Bld 89 70 - 99 mg/dL   BUN 34 (H) 6 - 23 mg/dL   Creat 1.07 0.50 - 1.10 mg/dL   Calcium 9.7 8.4 - 10.5 mg/dL  Vit D  25 hydroxy (rtn osteoporosis monitoring)   Collection Time: 04/17/14  4:51 PM  Result Value Ref Range   Vit D, 25-Hydroxy 47 30 - 100 ng/mL  Sedimentation Rate   Collection Time: 04/17/14  4:51 PM  Result Value Ref Range   Sed Rate 38 (H) 0 - 22 mm/hr    Assessment and Plan: Problem List Items Addressed This Visit      Unprioritized   Gait Instability - Multifactorial origin including impaired  vision, dementia, peripheral and axial osteoarthritis, persistent pain,  Clonidine, mirtazipine, spinal stenosis, polypharmacy, mixed urinary incontinence, peripheral neuropathy, balance impairment and muscle disuse atrophy  - [name] and patient open to home health OT, PT and home health safety evaluations and treatment.  These were ordered.  - Adequate Vitamin D level.  No additional Vitamin D supplementation needed.  - ESR within normal limits for age of patient.  Doubt PMR a part of patient's weakness.  - Recommend trial off of Clonidine (risk of orthostasis and sedation, on Beer's list) -    Relevant Orders      Sedimentation Rate (Completed)      Face-to-face encounter (required for Medicare/Medicaid patients)   Rotator cuff tear arthropathy - Primarily musculoskeletal pain  - Pain Clinic initial appointment next week - [name] and pt report good response to Lidoderm patch to right shoulder. Prescription for it given to dgt.  This should initiate a PA authorization form.  I anticipate that patient's failures on other analgesics and inability to take some analgesics will make the patient eligible for Medicaid coverage of the patches.  - Consider changing mirtazapine to and SNRI like duloxetine or venlafaxine for their analgesic effects on chronic pain.  - Consider referral for intraarticular corticosteroid injection of Glenohumeral joint under ultrasound.  May have better effect than subacromial injection.  - Home OT evaluation for assist devices and exercises to preserve the current right arm function and strength.     Macular degeneration, age related, nonexudative - Start Ocuvite daily - Continue follow up with patient's ophthamologist   Hypothyroidism - Recent TSH excessively low. - Would recheck on next office visit and adjust levothyroxine replacement therapy appropriately.   Combined visual hearing impairment - Pt referred to [facility] for evaluation and consideration of hearing aid  to improve quality of life.  - Medicaid will often cover basic model digital hearing aids, unlike Medicare who does not cover hearing aids at all.         Dementia     - FAST Stage: Stage 6 (moderate-to-severe, dependence in most ADLs) Palliative Performance Score: 50%.  Behavioral and Psychological Symptoms of Dementia: none  Pharmacologic treatments of dementia: none  Physical Activity recommendation: OT, PT therapy Long-term care planning: If family and / or patient resistant to discussing Code Status / Advanced Planning, then consider discussion completion Medical Order for Scope of Practice (MOST) form.  A family's completion of MOSt form can be used as a segue into this more difficulty end of life topics.  Other Visit Diagnoses    High risk medication use        Relevant Orders       Basic Metabolic Panel (Completed)       Home Health       Face-to-face encounter (required for Medicare/Medicaid patients)    Osteoporosis        Relevant Orders       Vit D  25 hydroxy (rtn osteoporosis monitoring) (Completed)       Home Health       Face-to-face encounter (required for Medicare/Medicaid patients)    Hearing loss, bilateral        Relevant Orders       Ambulatory referral to Audiology        Advanced Directives: Code Status:  Not discussed.   Montreal Cognitive Assessment  04/17/2014  Visuospatial/ Executive (0/5) (No Data)  Naming (0/3) (No Data)  Attention: Read list of digits (0/2) 0  Attention: Read list of letters (0/1) 0  Attention: Serial 7 subtraction starting at 100 (0/3) 1  Language: Repeat phrase (0/2) 0  Language : Fluency (0/1) 0  Abstraction (0/2) 1  Delayed Recall (0/5) 0  Orientation (0/6) 6   Contact: First and Last Name if other than the patient involved:  [name] Ruales (dgt) [phone] (home)      We were unable to complete the entire Geriatric Assessment.  Pt to return to [place] [facility] in early January to complete evaluation.   Patient to Follow up with Geriatric Clinic in Early January.

## 2014-04-18 NOTE — Telephone Encounter (Signed)
Needs to get the cardiologist summary she left yesterday at the visit Also wants yesterday summary She will pick them up

## 2014-04-18 NOTE — Telephone Encounter (Signed)
Daughter is aware of this. Cledith Abdou,CMA  

## 2014-04-21 DIAGNOSIS — M2540 Effusion, unspecified joint: Secondary | ICD-10-CM | POA: Diagnosis not present

## 2014-04-21 DIAGNOSIS — M1990 Unspecified osteoarthritis, unspecified site: Secondary | ICD-10-CM | POA: Diagnosis not present

## 2014-04-21 DIAGNOSIS — M75121 Complete rotator cuff tear or rupture of right shoulder, not specified as traumatic: Secondary | ICD-10-CM | POA: Diagnosis not present

## 2014-04-21 DIAGNOSIS — G894 Chronic pain syndrome: Secondary | ICD-10-CM | POA: Diagnosis not present

## 2014-04-21 DIAGNOSIS — M79646 Pain in unspecified finger(s): Secondary | ICD-10-CM | POA: Diagnosis not present

## 2014-04-21 DIAGNOSIS — M792 Neuralgia and neuritis, unspecified: Secondary | ICD-10-CM | POA: Diagnosis not present

## 2014-04-21 DIAGNOSIS — M60811 Other myositis, right shoulder: Secondary | ICD-10-CM | POA: Diagnosis not present

## 2014-04-21 DIAGNOSIS — M25511 Pain in right shoulder: Secondary | ICD-10-CM | POA: Diagnosis not present

## 2014-04-23 ENCOUNTER — Other Ambulatory Visit: Payer: Self-pay | Admitting: Family Medicine

## 2014-04-23 ENCOUNTER — Telehealth: Payer: Self-pay | Admitting: Family Medicine

## 2014-04-23 NOTE — Telephone Encounter (Signed)
Spoke with Jordan [name]. Advised that this medication is not a great option for some one of her mothers age. I advised her of the increased risk of side effects and listed off several of the most common side effects.  I discussed that this medication could potentially provide more benefit than risk, though that this was difficult to know and that I felt that the risk outweighed the benefit. Jordan [name] agreed that this was not a good option for her mother at this time.

## 2014-04-23 NOTE — Telephone Encounter (Signed)
[name] called and wanted to speak with the doctor concerning her mother's visit to the Pain Clinic. The doctor there would like to put her mother on Silenor 3 mg at nite instead of Trazodone. She would like to know what the doctor thinks about that. Please call her. jw

## 2014-04-25 ENCOUNTER — Other Ambulatory Visit: Payer: Self-pay | Admitting: Family Medicine

## 2014-04-28 ENCOUNTER — Encounter: Payer: Self-pay | Admitting: Internal Medicine

## 2014-04-29 ENCOUNTER — Encounter: Payer: Self-pay | Admitting: *Deleted

## 2014-04-29 NOTE — Progress Notes (Signed)
Prior Authorization received from [employer] for [employer].  PA form placed in provider box for completion. Derl Barrow, RN

## 2014-05-05 NOTE — Progress Notes (Signed)
PA form completed by provider on 05/01/2014 and faxed 05/05/2014 to pt's insurance for review.  Derl Barrow, RN

## 2014-05-06 NOTE — Progress Notes (Signed)
Called pt's insurance to check status of PA for Lidoderm patches.  PA was denied.  Prior authorization request faxed to [place] [employer] [facility] regarding the denial.  Derl Barrow, RN

## 2014-05-08 ENCOUNTER — Ambulatory Visit (INDEPENDENT_AMBULATORY_CARE_PROVIDER_SITE_OTHER): Payer: Medicare Other | Admitting: Family Medicine

## 2014-05-08 VITALS — BP 150/65 | HR 81 | Temp 97.5°F | Ht 59.0 in | Wt 134.0 lb

## 2014-05-08 DIAGNOSIS — F0280 Dementia in other diseases classified elsewhere without behavioral disturbance: Secondary | ICD-10-CM

## 2014-05-08 DIAGNOSIS — F329 Major depressive disorder, single episode, unspecified: Secondary | ICD-10-CM | POA: Diagnosis not present

## 2014-05-08 DIAGNOSIS — G309 Alzheimer's disease, unspecified: Secondary | ICD-10-CM

## 2014-05-08 DIAGNOSIS — E038 Other specified hypothyroidism: Secondary | ICD-10-CM

## 2014-05-08 DIAGNOSIS — H9113 Presbycusis, bilateral: Secondary | ICD-10-CM | POA: Diagnosis not present

## 2014-05-08 DIAGNOSIS — G894 Chronic pain syndrome: Secondary | ICD-10-CM | POA: Diagnosis not present

## 2014-05-08 DIAGNOSIS — F32A Depression, unspecified: Secondary | ICD-10-CM

## 2014-05-08 LAB — TSH: TSH: 0.262 u[IU]/mL — AB (ref 0.350–4.500)

## 2014-05-08 MED ORDER — DULOXETINE HCL 20 MG PO CPEP: 20.0000 mg | ORAL_CAPSULE | Freq: Every day | ORAL | Status: DC

## 2014-05-08 MED ORDER — MIRTAZAPINE 7.5 MG PO TABS: 3.7500 mg | ORAL_TABLET | Freq: Every day | ORAL | Status: DC

## 2014-05-08 NOTE — Patient Instructions (Addendum)
Jordan [name] does meet the diagnosis of Dementia, it is possibly Alzheimers type of Dementia. She appears to have such good home support that I do not think starting medication for Dementia is indicated at this time.  Jordan [name]'s has depressive symptoms and chronic pain that may be responsive to an antidepressant that has indications for treatment of both issues called Duloxetine.  I would recommend decreasing the Mirtazapine to half a tablet daily for 7 days, then stop the Mirtazapine. After stopping Mirtazapine, start Duloxetine 20 mg capsule, one capsule each morning daily for two weeks.  I would like to see Jordan [name] back in two weeks To see how she is tolerating this new medication.  Will titrate up by 20 mg every two weeks, tolerance allowing, with goal of 60 mg daily of Duloxetine. Once Jordan [name] reaches Duloxetine 60 mg or the maximal tolerable dose, will give medication one month then assess how well Duloxetine is helping the pain and depression symptoms.   Continue Trazodone 50 mg at bedtime to help with sleep.  We will call you if Jordan [name]'s levothyroxine medication needs to be adjusted.

## 2014-05-09 ENCOUNTER — Encounter: Payer: Self-pay | Admitting: Family Medicine

## 2014-05-09 DIAGNOSIS — G894 Chronic pain syndrome: Secondary | ICD-10-CM

## 2014-05-09 DIAGNOSIS — H9110 Presbycusis, unspecified ear: Secondary | ICD-10-CM

## 2014-05-09 HISTORY — DX: Presbycusis, unspecified ear: H91.10

## 2014-05-09 HISTORY — DX: Chronic pain syndrome: G89.4

## 2014-05-09 MED ORDER — LEVOTHYROXINE SODIUM 100 MCG PO TABS: 100.0000 ug | ORAL_TABLET | Freq: Every day | ORAL | Status: DC

## 2014-05-09 NOTE — Addendum Note (Signed)
Addended byWendy Poet, TODD D on: 05/09/2014 02:09 PM   Modules accepted: Orders, Medications

## 2014-05-09 NOTE — Progress Notes (Signed)
Patient ID: Jordan [name], female   DOB: 08/28/1919, 81 y.o.   MRN: [id number] Patient ID: Jordan [name], female   DOB: February 11, 1920, 81 y.o.   MRN: [id number] [facility]:   Patient is accompanied by: patient and daughter, and Spanish interpreter, Sunday Spillers from [employer] Primary caregiver: son, daughter and adult caretaker (personal care service aid) History obtained from: patient, dgt, EMR Primary Care Provider: Cornelius Moras, MD Patient referred to [facility] by Dr Caryl Bis Reason for Referral: Gait Instability History This is a continuation of the [facility]iatric Clinic evaluation from 04/17/14.  New Information is in Red text.   HPI by problems:   Chronic Right Shoulder pain - Longstanding issue, Musculoskeletal aching pain with stiffness. - Pt unable to abduct right shoulder.  Right arm feels "heavy" - full rotator cuff tear on MRI - deemed no surgical candidate by ortho after placement of pacemaker. - Only temporary relief, few days,  from subacromial injection corticosteroids. - On scheduled APAP. Not able take NSAIDs secondary to CKD - Topical Voltaren provides small amount of pain relief.  - Intermittent use of  Oxycodone-APAP 5/325 few times a week.  Helps transiently some with pain. [name] administers the medication only for severe pain.  It makes patient sleepy but no confusion.  Pt with longstanding constipation problem even without taking opiate.  - Moderately effective response to Lidoderm patches to shoulder but has been unable to get because needs Prior Approval by Medicaid. - Prior Approval was submitted by Dr . We have not heard back about approval or denial.   Gait Instability - Longstanding issue. - Unable to transfer from sit to stand without moderate assistance. - Uses Walker to ambulate in home and manual WC for outdoors.  - No falls in last six months.  - Fall Risk Factors:  Impaired vision from macular degeneration and  glaucoma,more than 5 medications, clonidine, mirtazipine, axial and peripheral osteoarthritis, lumbar spinal stenosis, peripheral neuropathy, mixed urinary incontinence, dementia, persistent pain  .    Outpatient Encounter Prescriptions as of 05/08/2014  Medication Sig  . Vitamin D, Ergocalciferol, (DRISDOL) 50000 UNITS CAPS capsule Take 50,000 Units by mouth every 7 (seven) days.  Marland Kitchen acetaminophen (TYLENOL) 325 MG tablet Take 650 mg by mouth every 6 (six) hours as needed for mild pain.   Marland Kitchen allopurinol (ZYLOPRIM) 100 MG tablet Take 1 tablet (100 mg total) by mouth daily.  . beta carotene w/minerals (OCUVITE) tablet Take 1 tablet by mouth daily.  . Calcium Carbonate-Vitamin D (CALTRATE 600+D) 600-400 MG-UNIT per tablet Take 1 tablet by mouth 2 (two) times daily.    . cloNIDine (CATAPRES) 0.1 MG tablet take 1/2 tablet by mouth twice a day  . colchicine (COLCRYS) 0.6 MG tablet Take 1 tablet (0.6 mg total) by mouth 2 (two) times daily.  . diclofenac sodium (VOLTAREN) 1 % GEL Apply 2 g topically 4 (four) times daily as needed (pain).  . DULoxetine (CYMBALTA) 20 MG capsule Take 1 capsule (20 mg total) by mouth daily.  . furosemide (LASIX) 20 MG tablet Take 1 tablet (20 mg total) by mouth daily.  Marland Kitchen gabapentin (NEURONTIN) 100 MG capsule Take 2 capsules (200 mg total) by mouth 3 (three) times daily.  . Glucosamine-Chondroitin-Vit D3 1500-1200-800 MG-MG-UNIT PACK Take 1 tablet by mouth 2 (two) times daily.    Marland Kitchen levothyroxine (SYNTHROID, LEVOTHROID) 112 MCG tablet take 1 tablet by mouth every morning ON AN EMPTY STOMACH  . lidocaine (LIDODERM) 5 % Place 1 patch onto  the skin daily. Remove & Discard patch within 12 hours or as directed by MD  . lidocaine (LIDODERM) 5 % Place 1 patch onto the skin daily. Remove & Discard patch within 12 hours or as directed by MD  . mirtazapine (REMERON) 7.5 MG tablet Take 0.5 tablets (3.75 mg total) by mouth at bedtime. For 7 days then stop.  Marland Kitchen oxyCODONE-acetaminophen  (PERCOCET/ROXICET) 5-325 MG per tablet Take 1 tablet by mouth every 8 (eight) hours as needed for severe pain.  . polyethylene glycol (MIRALAX) packet Take 17 g by mouth daily.  . Prenatal Vit-Fe Fumarate-FA (PREPLUS) 27-1 MG TABS TAKE ONE TABLET BY MOUTH ONCE DAILY  . RA ASPIRIN EC 81 MG EC tablet take 1 tablet by mouth once daily  . RESTASIS 0.05 % ophthalmic emulsion Place 1 drop into both eyes daily.   . TOVIAZ 8 MG TB24 tablet Take 8 mg by mouth daily.   . TRAVATAN Z 0.004 % SOLN ophthalmic solution Place 1 drop into both eyes at bedtime.   . traZODone (DESYREL) 50 MG tablet Take 1 tablet (50 mg total) by mouth at bedtime.  . [DISCONTINUED] mirtazapine (REMERON) 7.5 MG tablet take 1 tablet by mouth at bedtime   History Patient Active Problem List   Diagnosis Date Noted  . Macular degeneration of both eyes 04/18/2014  . Glaucoma 04/18/2014  . Macular degeneration, age related, nonexudative 04/18/2014  . Combined visual hearing impairment 11/12/2013  . Gout 09/21/2013  . Persistent headaches 09/06/2013  . 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 CKD (chronic kidney disease) stage 3, GFR 30-59 ml/min 09/03/2013  . Chronic diastolic heart failure 41/58/3094  . Weakness 08/29/2013  . Mobitz type 2 second degree heart block 08/29/2013  . Constipation 08/23/2013  . Headache(784.0) 08/18/2013  . Alzheimer's dementia 07/30/2013  . Spinal stenosis of lumbar region 10/03/2012  . Rotator cuff tear arthropathy 09/11/2012  . Gait instability 08/28/2012  . Insomnia 06/14/2010  . MASS, LUNG 04/05/2010  . PERIPHERAL NEUROPATHY 03/29/2010  . INTERSTITIAL CYSTITIS 09/21/2009  . ACCIDENTAL FALL, HX OF 12/15/2008  . OSTEOPOROSIS 03/20/2008  . HERNIA, INCISIONAL VENTRAL W/O OBST/GNGR 10/25/2006  . Hypothyroidism 06/29/2006  . Depressive disorder 06/29/2006  . CARPAL TUNNEL SYNDROME 06/29/2006  . HTN (hypertension) 06/29/2006  .  CYSTOCELE/RECTOCELE/PROLAPSE,UNSPEC. 06/29/2006  . Urinary incontinence, mixed 06/29/2006  . OSTEOARTHRITIS, MULTI SITES 06/29/2006   Past Medical History  Diagnosis Date  . Cervicalgia   . Peripheral neuropathy   . Hypertension   . Osteoporosis   . Insomnia   . Arthritis   . Headache   . Hypothyroidism   . Right rotator cuff tear   . OA (osteoarthritis)     RIGHT SHOULDER AC JOINT  . Moderate dementia   . Gait instability   . Complete heart block   . History of colon polyps   . Diverticulosis of colon   . SUI (stress urinary incontinence, female)   . Lumbar stenosis   . Cryptogenic stroke S/P  LOOP RECORDER IMPLANT    DX  01/2013  --  SYNCOPAL EPISODE --  EPISODIC  ATRIAL TACHY/ FIBULATION  AND PAUSES  . Frequency of urination   . Chronic constipation   . Glaucoma of both eyes   . Hearing loss of both ears     no hearing aids  . Dry eyes   . Cervicalgia 05/18/2009    Qualifier: Diagnosis of  By: Walker Kehr MD, Patrick Jupiter    . DIVERTICULOSIS OF  COLON 06/29/2006    Qualifier: Diagnosis of  By: Benna Dunks    . Combined visual hearing impairment 11/12/2013  . Urinary incontinence, mixed 06/29/2006    Qualifier: Diagnosis of  By: Benna Dunks    . INTERSTITIAL CYSTITIS 09/21/2009    Qualifier: Diagnosis of  By: Sarita Haver  MD, Coralyn Mark    . Syncope and collapse 02/09/2013  . Right knee pain 01/15/2014  . Polymyalgia rheumatica 11/08/2013  . Loss of weight 04/05/2010    Qualifier: Diagnosis of  By: Walker Kehr MD, Patrick Jupiter    . Macular degeneration, age related, nonexudative 04/18/2014   Past Surgical History  Procedure Laterality Date  . Loop recorder implant  02-08-13; 08-30-13    MDT LinQ implanted by Dr Lovena Le for syncope, explanted 08-30-13 after CHB identified  . Total abdominal hysterectomy w/ bilateral salpingoophorectomy  03-15-2005  . Carpal tunnel release Right   . Eye surgery      cataract, bilat.  . Pacemaker insertion  08-30-2013    MDT ADDRL1 pacemaker implanted by Dr Rayann Heman for CHB  .  Loop recorder implant N/A 02/11/2013    Procedure: LOOP RECORDER IMPLANT;  Surgeon: Evans Lance, MD;  Location: [facility] CATH LAB;  Service: Cardiovascular;  Laterality: N/A;  . Permanent pacemaker insertion N/A 08/30/2013    Procedure: PERMANENT PACEMAKER INSERTION;  Surgeon: Coralyn Mark, MD;  Location: [facility] CATH LAB;  Service: Cardiovascular;  Laterality: N/A;   Family History  Problem Relation Age of Onset  . Kidney disease Mother   . Heart attack Neg Hx   . Stroke Neg Hx    History   Social History  . Marital Status: Widowed    Spouse Name: N/A    Number of Children: N/A  . Years of Education: N/A   Social History Main Topics  . Smoking status: Never Smoker   . Smokeless tobacco: Never Used  . Alcohol Use: No  . Drug Use: No  . Sexual Activity: No   Other Topics Concern  . None   Social History Narrative   lives with son, [place]; No tob, etoh.    Has three total children. 1 g'child.;    Dtr [name] (w) [phone], (h) [phone]; dtr [place]. Daughters usually interpret for her at visits and are very involved and ask lots of questions.     Basic Activities of Daily Living and Instrumental Activities of Daily Living Patient is dependent in eating, bathing, toileting, personal cares, ambulating, grooming, hygiene, dressing upper body, dressing lower body, meal preparation and taking own medications  Caregiver Burdens: family able to provide 80 / 25 care between patient's son, dgt, grandchildren, and PCS aid)   Falls in the past six months:   no  Health Maintenance reviewed: Immunization History  Administered Date(s) Administered  . Influenza Split 01/24/2012  . Influenza Whole 03/05/2007, 02/22/2010  . Influenza,inj,Quad PF,36+ Mos 02/10/2013, 01/14/2014  . Pneumococcal Polysaccharide-23 01/30/1997  . Td 03/03/1999, 05/18/2009     Diet: Regular Nutritional supplements: none  ROS (+) decrease weight;  (+) vision / hearing difficulty   Vital Signs Weight: 134  lb (60.782 kg) Body mass index is 27.05 kg/(m^2). Estimated Creatinine Clearance: 26.6 mL/min (by C-G formula based on Cr of 1.07). Body surface area is 1.59 meters squared. Filed Vitals:   05/08/14 1557  BP: 150/65  Pulse: 81  Temp: 97.5 F (36.4 C)  TempSrc: Oral  Height: 4' 11" (1.499 m)  Weight: 134 lb (60.782 kg)   Wt Readings from Last 3  Encounters:  05/08/14 134 lb (60.782 kg)  04/17/14 132 lb (59.875 kg)  04/16/14 136 lb (61.689 kg)   Sweep Audiometry, office: unable to hear at 40 db threshold in 500, 1000, 2000, 4000 Hz in either ear.  Vision (Snellen office chart) : OU 20 / 80; OS 20/120; OD 20/120     Sit-to-Stand Test: Unable to rise from seated position to standing without moderate one person assistance  4-Stage Stand Test:  Feet Side-by-side: No.   Retropulsion present  Psych: Bright affect, Speech easily interpreted by interpreter.   Montreal Cognitive Assessment-BLIND (SPANISH)  04/17/2014  Visuospatial/ Executive (0/5) (No Data)  Naming (0/3) (No Data)  Attention: Read list of digits (0/2) 0  Attention: Read list of letters (0/1) 0  Attention: Serial 7 subtraction starting at 100 (0/3) 1  Language: Repeat phrase (0/2) 0  Language : Fluency (0/1) 0  Abstraction (0/2) 1  Delayed Recall (0/5) 0  Orientation (0/6) 6   MoCA - Blind (Spanish version); 9 out of 22.  Correct MoCA = 12 out of 30.   Geriatric Depression Scale in Spanish: 8 out of 15 (High)    Labs  Lab Results  Component Value Date   VITAMINB12 613 11/16/2010    Lab Results  Component Value Date   FOLATE >20.0 ng/mL 03/21/2008    Lab Results  Component Value Date   TSH 0.262* 05/08/2014    No results found for: RPR    Chemistry      Component Value Date/Time   NA 138 04/17/2014 1651   K 4.3 04/17/2014 1651   CL 101 04/17/2014 1651   CO2 24 04/17/2014 1651   BUN 34* 04/17/2014 1651   CREATININE 1.07 04/17/2014 1651   CREATININE 1.02 09/04/2013 0552      Component  Value Date/Time   CALCIUM 9.7 04/17/2014 1651   ALKPHOS 101 08/30/2013 0504   AST 43* 08/30/2013 0504   ALT 93* 08/30/2013 0504   BILITOT 0.4 08/30/2013 0504       Lab Results  Component Value Date   HGBA1C  07/01/2010    5.6 (NOTE)                                                                       According to the ADA Clinical Practice Recommendations for 2011, when HbA1c is used as a screening test:   >=6.5%   Diagnostic of Diabetes Mellitus           (if abnormal result  is confirmed)  5.7-6.4%   Increased risk of developing Diabetes Mellitus  References:Diagnosis and Classification of Diabetes Mellitus,Diabetes UUVO,5366,44(IHKVQ 1):S62-S69 and Standards of Medical Care in         Diabetes - 2011,Diabetes Care,2011,34  (Suppl 1):S11-S61.      Lab Results  Component Value Date   WBC 12.7* 11/13/2013   HGB 12.0 11/13/2013   HCT 35.3* 11/13/2013   MCV 92.4 11/13/2013   PLT 244 11/13/2013    Results for orders placed or performed in visit on 05/08/14 (from the past 24 hour(s))  TSH   Collection Time: 05/08/14  4:50 PM  Result Value Ref Range   TSH 0.262 (L) 0.350 - 4.500 uIU/mL    Assessment and Plan: Problem  List Items Addressed This Visit      Unprioritized   Gait Instability - Multifactorial origin including impaired vision, dementia, peripheral and axial osteoarthritis, persistent pain,  Clonidine, mirtazipine, spinal stenosis, polypharmacy, mixed urinary incontinence, peripheral neuropathy, balance impairment and muscle disuse atrophy  - [name] and patient open to home health OT, PT and home health safety evaluations and treatment.  These were ordered.  - Adequate Vitamin D level.  No additional Vitamin D supplementation needed.  - ESR within normal limits for age of patient.  Doubt PMR a part of patient's weakness.  - Recommend trial off of Clonidine (risk of orthostasis and sedation, on Beer's list) - Home Health PT referral has not gone through.  [name] is  looking into the hold up on the referral.    Relevant Orders      Sedimentation Rate (Completed)      Face-to-face encounter (required for Medicare/Medicaid patients)   Rotator cuff tear arthropathy - Primarily musculoskeletal pain  - Pain Clinic initial appointment next week - [name] and pt report good response to Lidoderm patch to right shoulder. Prescription for it given to dgt.  This should initiate a PA authorization form.  I anticipate that patient's failures on other analgesics and inability to take some analgesics will make the patient eligible for Medicaid coverage of the patches.  - Consider changing mirtazapine to and SNRI like duloxetine or venlafaxine for their analgesic effects on chronic pain.  - Consider referral for intraarticular corticosteroid injection of Glenohumeral joint under ultrasound.  May have better effect than subacromial injection.  - Home OT evaluation for assist devices and exercises to preserve the current right arm function and strength.  - Home Health OT referral has not gone through.  [name] is looking into the hold up on the referral.       Hypothyroidism - Recent TSH excessively low. - Would recheck on next office visit and adjust levothyroxine replacement therapy appropriately. -  Lab Results  Component Value Date   TSH 0.262* 05/08/2014  - Apparently excess Levothyroxine replacement at 112 mcg daily.  - Recommend reduction to 100 mcg daily Levothyroxine with recheck in ~ 4 weeks of TSH.  - I sent in a new prescription for Levothyroxine 100 mcg tablets to take once a day.     Combined visual hearing impairment - Pt referred to [facility] for evaluation and consideration of hearing aid to improve quality of life.  - Medicaid will not cover Hearing Aid in state of [place]. I encouraged family to still take patient to [facility] audiology to see what affordable resources are available for hearing amplification for Ms Hanahan.          Dementia     -  FAST Stage: Stage 6 (moderatate to moderate-severe, dependence in most ADLs) Palliative Performance Score: 50%.  Behavioral and Psychological Symptoms of Dementia: none  Pharmacologic treatments of dementia: none  Physical Activity recommendation: OT, PT therapy Long-term care planning: If family and / or patient resistant to discussing Code Status / Advanced Planning, then consider discussion completion Medical Order for Scope of Practice (MOST) form.  A family's completion of MOSt form can be used as a segue into this more difficulty end of life topics.   - I gave my opinion to patient's dgt that Ms Leathers does have moderate to moderate-severe Dementia, likely of Alzheimer's type.  I did not recommend dementia medications at this time but informed her dgt that they can be tried as long  as there are target symptoms or outcomes that are used to decide if the medications are helping.   Depressive Disorder (NOS) and Chronic Pain condition.  - GDS score of 8 out of 15 is high.  Patient currently on Mirtazapine 7.5 mg daily and has been for "long time" per dgt.   - given the combination of Depressive symptoms and chronic pain, a trial of an adequately dosed SNRI would  be a reasonable therapeutic option for patient to palliate both conditions.  - After discussion with the patient's dgt, and with patient, it was decided to try a trial of Duloxetine. - Plan is to decrease Mirtazapine down to 3.75 mg daily for one week, then start Duloxetine 20 mg capsule, one capsule daily with follow up appointment in 2 weeks in [place] clinic to see how she is tolerating this change. We would then titrate by 20 mg every two weeks with goal of assessing for efficacy when Duloxetine dose has reached 60 mg daily or maximally tolerable dose less than 60 mg daily for 4 weeks.  - Ms Overholser may continue on her trazadone 50 mg qhs as needed sleep.             Other Visit Diagnoses    High risk medication use        Relevant  Orders       Basic Metabolic Panel (Completed)       Home Health       Face-to-face encounter (required for Medicare/Medicaid patients)    Osteoporosis        Relevant Orders       Vit D  25 hydroxy (rtn osteoporosis monitoring) (Completed)       Home Health       Face-to-face encounter (required for Medicare/Medicaid patients)    Hearing loss, bilateral        Relevant Orders       Ambulatory referral to Audiology        Advanced Directives: Code Status:  Not discussed.   Montreal Cognitive Assessment  04/17/2014  Visuospatial/ Executive (0/5) (No Data)  Naming (0/3) (No Data)  Attention: Read list of digits (0/2) 0  Attention: Read list of letters (0/1) 0  Attention: Serial 7 subtraction starting at 100 (0/3) 1  Language: Repeat phrase (0/2) 0  Language : Fluency (0/1) 0  Abstraction (0/2) 1  Delayed Recall (0/5) 0  Orientation (0/6) 6   Contact: First and Last Name if other than the patient involved:  [name] Ruales (dgt) [phone] (home)    Office Visit took 40 minutes with over 20 minutes involved with counseling and coordination of services. Paperwork for patient's son's FMLA completed during visit.

## 2014-05-12 ENCOUNTER — Telehealth: Payer: Self-pay | Admitting: Internal Medicine

## 2014-05-12 NOTE — Telephone Encounter (Signed)
New message       1. Has your device fired? no 2. Is you device beeping? no 3. Are you experiencing draining or swelling at device site? no 4. Are you calling to see if we received your device transmission? Yes----they sent it in december  5. Have you passed out? no

## 2014-05-13 ENCOUNTER — Telehealth: Payer: Self-pay | Admitting: Family Medicine

## 2014-05-13 NOTE — Telephone Encounter (Signed)
Confirmed to emergency contact remote was received 04/16/14. EC knows next remote is 07/16/14. EC also knows f/u in Aug w/ Dr. Rayann Heman will be a letter mailed to schedule an appt.

## 2014-05-13 NOTE — Telephone Encounter (Signed)
Daughter is requesting have forms completed by Dr. Wendy Poet or Caryl Bis for mother.  Need to have back at earliest convenience.  Will drop off to front desk

## 2014-05-13 NOTE — Telephone Encounter (Signed)
Placed in Dr. Tharon Aquas box box. Jordan [name], Jordan [name]

## 2014-05-13 NOTE — Telephone Encounter (Signed)
Daughter brought in FMLA paperwork to be completed Will pick up when ready

## 2014-05-14 NOTE — Telephone Encounter (Signed)
I placed these in Dr McDiarmid's box for completion. He initially filled them out and signed them. It appears the forms need more information.

## 2014-05-16 ENCOUNTER — Telehealth: Payer: Self-pay | Admitting: Family Medicine

## 2014-05-16 NOTE — Telephone Encounter (Signed)
Brought in form about medicare drug denial to be completed and faxed

## 2014-05-16 NOTE — Telephone Encounter (Signed)
Placed in MDs box. [name], Jessica Dawn  

## 2014-05-16 NOTE — Telephone Encounter (Signed)
Again attempted to complete FMLA form for intermittent leave for Ms Othman's son so he can care for his mother should she become ill.   I left message for Ms Iran Sizer [phone] that completed FMLA form is available for pick up from [facility] desk.

## 2014-05-19 NOTE — Telephone Encounter (Signed)
Left voice message for pt and son that form is complete and ready for pick up.  Derl Barrow, RN

## 2014-05-19 NOTE — Telephone Encounter (Signed)
Form completed. Returned to [employer].

## 2014-05-29 ENCOUNTER — Ambulatory Visit (INDEPENDENT_AMBULATORY_CARE_PROVIDER_SITE_OTHER): Payer: Medicare Other | Admitting: Family Medicine

## 2014-05-29 VITALS — BP 146/67 | HR 69 | Temp 97.5°F | Ht 59.0 in | Wt 136.0 lb

## 2014-05-29 DIAGNOSIS — M75101 Unspecified rotator cuff tear or rupture of right shoulder, not specified as traumatic: Secondary | ICD-10-CM

## 2014-05-29 DIAGNOSIS — Z7409 Other reduced mobility: Secondary | ICD-10-CM

## 2014-05-29 DIAGNOSIS — M6250 Muscle wasting and atrophy, not elsewhere classified, unspecified site: Secondary | ICD-10-CM

## 2014-05-29 DIAGNOSIS — R531 Weakness: Secondary | ICD-10-CM | POA: Diagnosis not present

## 2014-05-29 DIAGNOSIS — M12511 Traumatic arthropathy, right shoulder: Secondary | ICD-10-CM | POA: Diagnosis not present

## 2014-05-29 DIAGNOSIS — M25511 Pain in right shoulder: Secondary | ICD-10-CM | POA: Diagnosis not present

## 2014-05-29 DIAGNOSIS — M12811 Other specific arthropathies, not elsewhere classified, right shoulder: Secondary | ICD-10-CM

## 2014-05-29 DIAGNOSIS — Z9189 Other specified personal risk factors, not elsewhere classified: Secondary | ICD-10-CM

## 2014-05-29 NOTE — Patient Instructions (Signed)
Increase your Duloxetine to two capsules a day. Take at the same time. We will contact you with your appointment with sports medicine.

## 2014-05-29 NOTE — Progress Notes (Signed)
Interpretor used from language resources [employer]	[place]. Orlandis Sanden, [name]

## 2014-05-30 DIAGNOSIS — R532 Functional quadriplegia: Secondary | ICD-10-CM

## 2014-05-30 DIAGNOSIS — Z7409 Other reduced mobility: Secondary | ICD-10-CM | POA: Insufficient documentation

## 2014-05-30 DIAGNOSIS — Z789 Other specified health status: Secondary | ICD-10-CM

## 2014-05-30 HISTORY — DX: Other reduced mobility: Z74.09

## 2014-05-30 HISTORY — DX: Functional quadriplegia: R53.2

## 2014-05-30 HISTORY — DX: Other specified health status: Z78.9

## 2014-05-30 NOTE — Assessment & Plan Note (Signed)
Dr Jazmynn Pho entered the home PT and OT orders incorrectly on 04/17/14 [facility] clinic visit. This is the reason Jordan [name] has not yet been evaluated by home PT and OT. The order for ambulatory home PT and OT orders were re-entered on today.

## 2014-05-30 NOTE — Assessment & Plan Note (Signed)
Pain in right shoulder is persistent. May have had a slight improvement in pain after start of Duloxetine 20 mg daily Patient tolerating duloxetine. Recommend patient increase duloxetine to 40 mg daily with goal of increasing to 60 mg daily on next visit if tolerating it. Assess patient one month after receiving Duloxetine 60 mg daily to decide if is helping, at least some, with patient's pain.   Patient was referred to Sports Medicine for consideration of US guided shoulder bursa injections or glenohumeral injections to help with pain.  Ms Gamba is following up with the [facility] next month.

## 2014-06-05 ENCOUNTER — Telehealth: Payer: Self-pay | Admitting: Family Medicine

## 2014-06-05 NOTE — Telephone Encounter (Signed)
Raquel Sarna from [facility] called and needs the office notes from the patients 05/08/14 visit. They need it to match because of Medicare. Please call her if you have any questions [phone] ext [phone]. You can fax the notes to her at [phone] put this attention Raquel Sarna. jw

## 2014-06-05 NOTE — Telephone Encounter (Signed)
Raquel Sarna from [facility] called and needs the office notes from the patients 05/08/14 visit. They need it to match because of Medicare. Please call if you have any questions [phone] ext [phone]. Fa

## 2014-06-05 NOTE — Telephone Encounter (Signed)
Notes faxed to [facility] at [facility]. Kate Sweetman,CMA

## 2014-06-10 ENCOUNTER — Ambulatory Visit: Payer: Medicare Other | Admitting: Sports Medicine

## 2014-06-10 ENCOUNTER — Other Ambulatory Visit: Payer: Self-pay | Admitting: Family Medicine

## 2014-06-11 NOTE — Telephone Encounter (Signed)
Refill given. Per review of Dr McDiarmid's last not the patient was to increase to 40 mg duloxetine daily. Will send in prescription reflecting this change.

## 2014-06-13 ENCOUNTER — Telehealth: Payer: Self-pay | Admitting: Internal Medicine

## 2014-06-13 ENCOUNTER — Ambulatory Visit (INDEPENDENT_AMBULATORY_CARE_PROVIDER_SITE_OTHER): Payer: Medicare Other | Admitting: *Deleted

## 2014-06-13 DIAGNOSIS — Z950 Presence of cardiac pacemaker: Secondary | ICD-10-CM

## 2014-06-13 DIAGNOSIS — I441 Atrioventricular block, second degree: Secondary | ICD-10-CM

## 2014-06-13 LAB — MDC_IDC_ENUM_SESS_TYPE_INCLINIC
Brady Statistic AP VS Percent: 0 %
Brady Statistic AS VS Percent: 0 %
Date Time Interrogation Session: 20160212175735
Lead Channel Impedance Value: 532 Ohm
Lead Channel Impedance Value: 550 Ohm
Lead Channel Pacing Threshold Amplitude: 0.75 V
Lead Channel Pacing Threshold Pulse Width: 0.4 ms
Lead Channel Pacing Threshold Pulse Width: 0.4 ms
Lead Channel Sensing Intrinsic Amplitude: 5.6 mV
Lead Channel Setting Pacing Amplitude: 2 V
Lead Channel Setting Pacing Amplitude: 2.5 V
Lead Channel Setting Pacing Pulse Width: 0.4 ms
MDC IDC MSMT BATTERY IMPEDANCE: 100 Ohm
MDC IDC MSMT BATTERY REMAINING LONGEVITY: 138 mo
MDC IDC MSMT BATTERY VOLTAGE: 2.79 V
MDC IDC MSMT LEADCHNL RA PACING THRESHOLD AMPLITUDE: 0.5 V
MDC IDC MSMT LEADCHNL RA SENSING INTR AMPL: 2 mV
MDC IDC SET LEADCHNL RV SENSING SENSITIVITY: 4 mV
MDC IDC STAT BRADY AP VP PERCENT: 11 %
MDC IDC STAT BRADY AS VP PERCENT: 89 %

## 2014-06-13 NOTE — Telephone Encounter (Signed)
Pt & caretaker here for add-on appt. I will explain Carelink in-person.

## 2014-06-13 NOTE — Telephone Encounter (Signed)
New message      Last night at 3:15am pt was awaken by a pain where her pacemaker is.  She says the skin "ballooned up".  Could this be something from her pacemaker check?  Please check the pacemaker record and see what was going on at 3:15am.

## 2014-06-13 NOTE — Telephone Encounter (Signed)
Per [facility], pt asymptomatic. Pt's device pocket ballooned up but specific size unknown b/c caregiver was told by pt over the phone.   I updated [name] pt's Carelink monitor is NOT automatic. We discussed how to send a manual transmission. I implored [name] to bring pt to the device clinic regardless of sending a transmission.   Appt made w/ device clinic today while Dr. Lovena Le here.

## 2014-06-13 NOTE — Progress Notes (Signed)
Pt's caregiver wanted appt due to a device pocket "ballooning sensation" at 3:15am today. Device pocket shows no discoloration, no redness, no swelling, or bruising. Normal device function. Thresholds, sensing, impedances consistent with previous measurements. Device programmed to maximize longevity. 6 mode switches--- <0.1%, no EGMs. No high ventricular rates noted. Device programmed at appropriate safety margins. Histogram distribution appropriate for patient activity level. Device programmed to optimize intrinsic conduction---turned on Search AV+ w/ max increase of 156ms. Estimated longevity 11.68yrs. [facility] 09/11/14 & ROV w/ Dr. Rayann Heman 8/16.   After the device interrogation, pt's daughter, Stasia Cavalier, wanted to discuss various other symptoms. She used vague descriptors such as, "[pt] doesn't look well in her face." [name] also wanted Dr. Lovena Le to review the vague ailments. After completing interrogation and pocket inspection, I reassured pt & [name] all was fine from a device standpoint. [name] refused to depart until speaking w/ Dr. Lovena Le.  [name] wanted more detailed heart diagnosis. She was advised to first seek advice from her PCP or seek urgent care at the ER.

## 2014-06-13 NOTE — Telephone Encounter (Signed)
New Message  Pt caretaker- Stasia Cavalier calling to speak w/ Juanda Crumble about sending a transmission and wanted to speak w/ someone in the device clinic. Please call back and discuss.

## 2014-06-15 ENCOUNTER — Other Ambulatory Visit: Payer: Self-pay | Admitting: Family Medicine

## 2014-06-17 DIAGNOSIS — I441 Atrioventricular block, second degree: Secondary | ICD-10-CM | POA: Diagnosis not present

## 2014-06-17 DIAGNOSIS — N183 Chronic kidney disease, stage 3 (moderate): Secondary | ICD-10-CM | POA: Diagnosis not present

## 2014-06-17 DIAGNOSIS — G309 Alzheimer's disease, unspecified: Secondary | ICD-10-CM | POA: Diagnosis not present

## 2014-06-17 DIAGNOSIS — F0280 Dementia in other diseases classified elsewhere without behavioral disturbance: Secondary | ICD-10-CM | POA: Diagnosis not present

## 2014-06-17 DIAGNOSIS — I739 Peripheral vascular disease, unspecified: Secondary | ICD-10-CM | POA: Diagnosis not present

## 2014-06-17 DIAGNOSIS — I5032 Chronic diastolic (congestive) heart failure: Secondary | ICD-10-CM | POA: Diagnosis not present

## 2014-06-17 DIAGNOSIS — M25511 Pain in right shoulder: Secondary | ICD-10-CM | POA: Diagnosis not present

## 2014-06-17 DIAGNOSIS — G894 Chronic pain syndrome: Secondary | ICD-10-CM | POA: Diagnosis not present

## 2014-06-17 DIAGNOSIS — M4806 Spinal stenosis, lumbar region: Secondary | ICD-10-CM | POA: Diagnosis not present

## 2014-06-18 ENCOUNTER — Encounter: Payer: Self-pay | Admitting: Sports Medicine

## 2014-06-18 ENCOUNTER — Ambulatory Visit (INDEPENDENT_AMBULATORY_CARE_PROVIDER_SITE_OTHER): Payer: Medicare Other | Admitting: Sports Medicine

## 2014-06-18 VITALS — BP 165/65 | Ht <= 58 in | Wt 133.8 lb

## 2014-06-18 DIAGNOSIS — M75101 Unspecified rotator cuff tear or rupture of right shoulder, not specified as traumatic: Secondary | ICD-10-CM | POA: Diagnosis present

## 2014-06-18 MED ORDER — METHYLPREDNISOLONE ACETATE 40 MG/ML IJ SUSP
40.0000 mg | Freq: Once | INTRAMUSCULAR | Status: AC
  Administered 2014-06-18: 40 mg via INTRA_ARTICULAR

## 2014-06-19 NOTE — Progress Notes (Signed)
   Subjective:    Patient ID: Jordan [name], female    DOB: 04-08-1920, 81 y.o.   MRN: [id number]  HPI chief complaint: Right shoulder pain  Very pleasant 81 year old female comes in today with her daughter. Patient is complaining of diffuse right shoulder pain. Patient has had problems with this shoulder in the past. An MRI done in July 2014 showed a complete retracted rotator cuff tear as well as evidence of glenohumeral joint instability. A few weeks later an ultrasound-guided glenohumeral injection was performed in our office by Dr. Georgina Snell which provided the patient with several months of symptom relief. Pain has now returned. It is similar in nature to what she has experienced previously. No recent trauma. She is here today both with her daughter and an interpreter. She was referred to [place] by her PCP for consideration of a repeat ultrasound-guided glenohumeral cortisone injection.  Interim medical history reviewed Medications reviewed Allergies reviewed    Review of Systems     Objective:   Physical Exam Well-developed. Sitting comfortably in the exam room.  Right shoulder: Very limited active and passive range of motion in all planes. Global strength weakness. Noticeable atrophy of the shoulder. Good radial and ulnar pulses distally.  MRI of the right shoulder is as above.       Assessment & Plan:  Chronic complete retracted rotator cuff tear, right shoulder  Given her good response to a previous glenohumeral cortisone injection I will repeat that today under ultrasound guidance. Hopefully this will give her several months of symptom relief. I explained to both the patient and her daughter that I would be happy to repeat this injection in 3-4 months if necessary. Otherwise follow-up when necessary.  Consent obtained and verified. Time-out conducted. Noted no overlying erythema, induration, or other signs of local infection. Skin prepped in a sterile fashion. Topical analgesic  spray: Ethyl chloride. Joint: right shoulder (US guided glenohumeral) Needle: 25g 1.5 inch Completed without difficulty. Meds: 3cc 1% xylocaine, 1cc (40mg ) depomedrol  Advised to call if fevers/chills, erythema, induration, drainage, or persistent bleeding.

## 2014-06-23 DIAGNOSIS — G309 Alzheimer's disease, unspecified: Secondary | ICD-10-CM | POA: Diagnosis not present

## 2014-06-23 DIAGNOSIS — I441 Atrioventricular block, second degree: Secondary | ICD-10-CM | POA: Diagnosis not present

## 2014-06-23 DIAGNOSIS — M4806 Spinal stenosis, lumbar region: Secondary | ICD-10-CM | POA: Diagnosis not present

## 2014-06-23 DIAGNOSIS — I5032 Chronic diastolic (congestive) heart failure: Secondary | ICD-10-CM | POA: Diagnosis not present

## 2014-06-23 DIAGNOSIS — G894 Chronic pain syndrome: Secondary | ICD-10-CM | POA: Diagnosis not present

## 2014-06-23 DIAGNOSIS — M25511 Pain in right shoulder: Secondary | ICD-10-CM | POA: Diagnosis not present

## 2014-06-24 ENCOUNTER — Encounter: Payer: Self-pay | Admitting: Family Medicine

## 2014-06-24 ENCOUNTER — Ambulatory Visit (INDEPENDENT_AMBULATORY_CARE_PROVIDER_SITE_OTHER): Payer: Medicare Other | Admitting: Family Medicine

## 2014-06-24 VITALS — BP 134/64 | HR 79 | Temp 97.9°F | Wt 133.6 lb

## 2014-06-24 DIAGNOSIS — M12511 Traumatic arthropathy, right shoulder: Secondary | ICD-10-CM | POA: Diagnosis not present

## 2014-06-24 DIAGNOSIS — I10 Essential (primary) hypertension: Secondary | ICD-10-CM | POA: Diagnosis not present

## 2014-06-24 DIAGNOSIS — M792 Neuralgia and neuritis, unspecified: Secondary | ICD-10-CM

## 2014-06-24 DIAGNOSIS — R05 Cough: Secondary | ICD-10-CM | POA: Diagnosis not present

## 2014-06-24 DIAGNOSIS — R0989 Other specified symptoms and signs involving the circulatory and respiratory systems: Secondary | ICD-10-CM

## 2014-06-24 DIAGNOSIS — I519 Heart disease, unspecified: Secondary | ICD-10-CM

## 2014-06-24 DIAGNOSIS — I5189 Other ill-defined heart diseases: Secondary | ICD-10-CM

## 2014-06-24 DIAGNOSIS — M12811 Other specific arthropathies, not elsewhere classified, right shoulder: Secondary | ICD-10-CM

## 2014-06-24 DIAGNOSIS — I441 Atrioventricular block, second degree: Secondary | ICD-10-CM

## 2014-06-24 DIAGNOSIS — R059 Cough, unspecified: Secondary | ICD-10-CM

## 2014-06-24 DIAGNOSIS — M75101 Unspecified rotator cuff tear or rupture of right shoulder, not specified as traumatic: Secondary | ICD-10-CM

## 2014-06-24 NOTE — Patient Instructions (Signed)
Nice to see you. Please go get the chest x-ray. Please schedule an appointment for lab work. If she develops chest pain, shortness of breath, abdominal pain, fever, or worsening cough please seek medical attention.

## 2014-06-25 ENCOUNTER — Ambulatory Visit
Admission: RE | Admit: 2014-06-25 | Discharge: 2014-06-25 | Disposition: A | Discharge status: Home / Self Care | Payer: Medicare Other | Source: Ambulatory Visit | Attending: Family Medicine | Admitting: Family Medicine

## 2014-06-25 ENCOUNTER — Other Ambulatory Visit: Payer: Medicare Other

## 2014-06-25 DIAGNOSIS — R05 Cough: Secondary | ICD-10-CM

## 2014-06-25 DIAGNOSIS — N183 Chronic kidney disease, stage 3 unspecified: Secondary | ICD-10-CM

## 2014-06-25 DIAGNOSIS — I5032 Chronic diastolic (congestive) heart failure: Secondary | ICD-10-CM

## 2014-06-25 DIAGNOSIS — R918 Other nonspecific abnormal finding of lung field: Secondary | ICD-10-CM | POA: Diagnosis not present

## 2014-06-25 DIAGNOSIS — J439 Emphysema, unspecified: Secondary | ICD-10-CM | POA: Diagnosis not present

## 2014-06-25 DIAGNOSIS — I5189 Other ill-defined heart diseases: Secondary | ICD-10-CM

## 2014-06-25 DIAGNOSIS — R059 Cough, unspecified: Secondary | ICD-10-CM

## 2014-06-25 DIAGNOSIS — I519 Heart disease, unspecified: Secondary | ICD-10-CM | POA: Diagnosis not present

## 2014-06-25 LAB — CBC
HCT: 36.1 % (ref 36.0–46.0)
Hemoglobin: 11.9 g/dL — ABNORMAL LOW (ref 12.0–15.0)
MCH: 32.6 pg (ref 26.0–34.0)
MCHC: 33 g/dL (ref 30.0–36.0)
MCV: 98.9 fL (ref 78.0–100.0)
MPV: 10.9 fL (ref 8.6–12.4)
PLATELETS: 222 10*3/uL (ref 150–400)
RBC: 3.65 MIL/uL — ABNORMAL LOW (ref 3.87–5.11)
RDW: 13.3 % (ref 11.5–15.5)
WBC: 9.2 10*3/uL (ref 4.0–10.5)

## 2014-06-25 NOTE — Progress Notes (Signed)
[facility] phlebotomist drew:  CBC & BNP

## 2014-06-26 ENCOUNTER — Encounter: Payer: Self-pay | Admitting: Internal Medicine

## 2014-06-26 ENCOUNTER — Telehealth: Payer: Self-pay | Admitting: Family Medicine

## 2014-06-26 DIAGNOSIS — G309 Alzheimer's disease, unspecified: Secondary | ICD-10-CM | POA: Diagnosis not present

## 2014-06-26 DIAGNOSIS — I5032 Chronic diastolic (congestive) heart failure: Secondary | ICD-10-CM | POA: Diagnosis not present

## 2014-06-26 DIAGNOSIS — M25511 Pain in right shoulder: Secondary | ICD-10-CM | POA: Diagnosis not present

## 2014-06-26 DIAGNOSIS — I441 Atrioventricular block, second degree: Secondary | ICD-10-CM | POA: Diagnosis not present

## 2014-06-26 DIAGNOSIS — M4806 Spinal stenosis, lumbar region: Secondary | ICD-10-CM | POA: Diagnosis not present

## 2014-06-26 DIAGNOSIS — G894 Chronic pain syndrome: Secondary | ICD-10-CM | POA: Diagnosis not present

## 2014-06-26 LAB — BRAIN NATRIURETIC PEPTIDE: Brain Natriuretic Peptide: 187.8 pg/mL — ABNORMAL HIGH (ref 0.0–100.0)

## 2014-06-26 NOTE — Telephone Encounter (Signed)
xrays and lab work was done yesterday

## 2014-06-26 NOTE — Telephone Encounter (Signed)
Attempted to call patients daughter. No answer. Left a message and asked that they call back to the office.

## 2014-06-26 NOTE — Telephone Encounter (Signed)
Will forward to make provider aware. Navraj Dreibelbis,CMA

## 2014-06-27 DIAGNOSIS — G309 Alzheimer's disease, unspecified: Secondary | ICD-10-CM | POA: Diagnosis not present

## 2014-06-27 DIAGNOSIS — M25511 Pain in right shoulder: Secondary | ICD-10-CM | POA: Diagnosis not present

## 2014-06-27 DIAGNOSIS — M792 Neuralgia and neuritis, unspecified: Secondary | ICD-10-CM | POA: Insufficient documentation

## 2014-06-27 DIAGNOSIS — M4806 Spinal stenosis, lumbar region: Secondary | ICD-10-CM | POA: Diagnosis not present

## 2014-06-27 DIAGNOSIS — I441 Atrioventricular block, second degree: Secondary | ICD-10-CM | POA: Diagnosis not present

## 2014-06-27 DIAGNOSIS — I5032 Chronic diastolic (congestive) heart failure: Secondary | ICD-10-CM | POA: Diagnosis not present

## 2014-06-27 DIAGNOSIS — G894 Chronic pain syndrome: Secondary | ICD-10-CM | POA: Diagnosis not present

## 2014-06-27 MED ORDER — DULOXETINE HCL 60 MG PO CPEP: 60.0000 mg | ORAL_CAPSULE | Freq: Every day | ORAL | Status: DC

## 2014-06-27 NOTE — Progress Notes (Signed)
Patient ID: Jordan [name], female   DOB: 07-14-1919, 81 y.o.   MRN: [id number]  [name], MD Phone: [phone]  Jordan [name] is a 81 y.o. female who presents today for f/u.  HYPERTENSION Disease Monitoring Home BP Monitoring checking, typically in 0000000 systolic Chest pain- no    Dyspnea- no Medications Compliance-  Taking clonidine and lasix.  Edema- no  Patients daughter notes that her pacemaker "bubbled up" last week and felt as though it moved around. Patient can only say that she did not feel right at that time. She sat up and felt improved after a couple of minutes. No chest pain or dyspnea with this. Had pacemaker evaluated by cardiology and was told it was fine. Daughter has a number of complaints regarding the patients cardiologist and the manner in which he reacted to the above story. She is requesting a referral to a different cardiologist.   Pain in upper abdomen: patient reports 3 episodes of electric shock like pain moving from the xyphoid process to the epigastric region. Lasts for a few seconds then goes away. Is a shooting pain. Happens when she is sitting down. Started after the above mentioned pacemaker issue. No chest pain with this.  Does note some mild cough with this. Though no fevers, dyspnea, orthopnea, or productive cough.   Right rotator cuff tear: notes the pain had been worse over the past 1.5 weeks, though improved yesterday. She had an injection which they are unsure if helped. Duloxetine has been mildly beneficial.     Patient is a nonsmoker.   ROS: Per HPI   Physical Exam Filed Vitals:   06/24/14 1636  BP: 134/64  Pulse: 79  Temp: 97.9 F (36.6 C)    Gen: Well NAD, elderly, sitting in wheel chair HEENT: PERRL,  MMM Lungs: left lower lung field crackles, no wheezes Nl WOB Heart: RRR  Pacemaker site appears normal with no swelling or erythema Exts: Non edematous BL  LE, warm and well perfused.    Assessment/Plan: Please see individual  problem list.  [name], MD [place] PGY-3

## 2014-06-27 NOTE — Assessment & Plan Note (Signed)
Unsure of cause of pacemaker site "balooning up". Appears that pacemaker was interrogated and had normal function. Will refer to another cardiologist given daughter preference.

## 2014-06-27 NOTE — Assessment & Plan Note (Signed)
Persistent pain. Slightly improved over the past 1.5 days. Will continue duloxetine and increase to 60 mg daily.

## 2014-06-27 NOTE — Assessment & Plan Note (Addendum)
Patients electric type pain is consistent with a neuropathic pain. No prior history of sternotomy which could cause nerve damage. No chest pain with this. Some cough and crackles on exam, though this would be an unlikely presentation for a lung process. CXR to evaluate lungs revealed chronic changes. BNP indeterminate and patient does not appear volume overloaded. CBC with no signs of infection. Will continue to monitor this issue.

## 2014-06-27 NOTE — Assessment & Plan Note (Signed)
At goal. Continue current medications. 

## 2014-06-30 ENCOUNTER — Telehealth: Payer: Self-pay | Admitting: Family Medicine

## 2014-06-30 DIAGNOSIS — G894 Chronic pain syndrome: Secondary | ICD-10-CM | POA: Diagnosis not present

## 2014-06-30 DIAGNOSIS — I5032 Chronic diastolic (congestive) heart failure: Secondary | ICD-10-CM | POA: Diagnosis not present

## 2014-06-30 DIAGNOSIS — G309 Alzheimer's disease, unspecified: Secondary | ICD-10-CM | POA: Diagnosis not present

## 2014-06-30 DIAGNOSIS — M25511 Pain in right shoulder: Secondary | ICD-10-CM | POA: Diagnosis not present

## 2014-06-30 DIAGNOSIS — M4806 Spinal stenosis, lumbar region: Secondary | ICD-10-CM | POA: Diagnosis not present

## 2014-06-30 DIAGNOSIS — I441 Atrioventricular block, second degree: Secondary | ICD-10-CM | POA: Diagnosis not present

## 2014-06-30 NOTE — Telephone Encounter (Signed)
[name] is calling because she received a message from Dr. Biagio Quint to go over her mother's results. Please call her back. jw

## 2014-06-30 NOTE — Telephone Encounter (Signed)
Will forward to MD. [name],CMA  

## 2014-07-01 NOTE — Telephone Encounter (Signed)
Spoke with patients daughter regarding results of chest x-ray and lab work. Advised that CXR revealed chronic scarring in the bases of the lungs and also advised of the change in position of the pacemaker. Patient previously had pacemaker checked and per patients daughter this was functioning normally at that time. She has an appointment with cardiology this Thursday for further evaluation. I advised that they should discuss the pacemaker and the CXR with the cardiologist at that time. I also advised that they should bring up the electric like pain she has been having to ensure this is not cardiac or related to her pacemaker. If they state this is not related we can consider other treatment options. Daughter also states her mother has been on synthroid 112 mcg over the past month since her TSH was last checked. Per the last note it appears that the patient was supposed to be on 100 mcg daily after her last TSH check, though daughter has been giving her 112 mcg. Advised to start her on 100 mcg dose. Will need to recheck TSH at next visit with [place].

## 2014-07-01 NOTE — Telephone Encounter (Signed)
[name] called and would like [name] to call her on her cell phone number [phone]. jw

## 2014-07-01 NOTE — Telephone Encounter (Signed)
Spoke with daughter.  She was returning PCP's call to get lab results and xray results.  She is also aware of her appt on 07-03-14 with Richardson Dopp at [facility].  She is unsure if she wants to see him, but I informed her that she would need to discuss that with their office regarding who is more specialized with pacemakers.  [name] voiced understanding and will give them a call today.  Will forward to MD for results.  She can be reached on her cell. Gissel Keilman,CMA

## 2014-07-02 DIAGNOSIS — H4011X3 Primary open-angle glaucoma, severe stage: Secondary | ICD-10-CM | POA: Diagnosis not present

## 2014-07-03 ENCOUNTER — Ambulatory Visit (INDEPENDENT_AMBULATORY_CARE_PROVIDER_SITE_OTHER): Payer: Self-pay | Admitting: *Deleted

## 2014-07-03 ENCOUNTER — Ambulatory Visit (INDEPENDENT_AMBULATORY_CARE_PROVIDER_SITE_OTHER): Payer: Medicare Other | Admitting: Physician Assistant

## 2014-07-03 ENCOUNTER — Encounter: Payer: Self-pay | Admitting: Physician Assistant

## 2014-07-03 VITALS — BP 150/60 | HR 72 | Ht <= 58 in | Wt 133.0 lb

## 2014-07-03 DIAGNOSIS — I5032 Chronic diastolic (congestive) heart failure: Secondary | ICD-10-CM

## 2014-07-03 DIAGNOSIS — R0602 Shortness of breath: Secondary | ICD-10-CM

## 2014-07-03 DIAGNOSIS — I441 Atrioventricular block, second degree: Secondary | ICD-10-CM

## 2014-07-03 DIAGNOSIS — R0789 Other chest pain: Secondary | ICD-10-CM | POA: Diagnosis not present

## 2014-07-03 DIAGNOSIS — I10 Essential (primary) hypertension: Secondary | ICD-10-CM | POA: Diagnosis not present

## 2014-07-03 DIAGNOSIS — N183 Chronic kidney disease, stage 3 unspecified: Secondary | ICD-10-CM

## 2014-07-03 DIAGNOSIS — Z950 Presence of cardiac pacemaker: Secondary | ICD-10-CM

## 2014-07-03 LAB — MDC_IDC_ENUM_SESS_TYPE_INCLINIC
Battery Impedance: 100 Ohm
Battery Remaining Longevity: 163 mo
Battery Voltage: 2.79 V
Brady Statistic AP VS Percent: 6 %
Brady Statistic AS VP Percent: 1 %
Brady Statistic AS VS Percent: 93 %
Date Time Interrogation Session: 20160303170458
Lead Channel Pacing Threshold Amplitude: 0.75 V
Lead Channel Pacing Threshold Pulse Width: 0.4 ms
Lead Channel Pacing Threshold Pulse Width: 0.4 ms
Lead Channel Sensing Intrinsic Amplitude: 2 mV
Lead Channel Sensing Intrinsic Amplitude: 8 mV
Lead Channel Setting Pacing Amplitude: 2.5 V
Lead Channel Setting Pacing Pulse Width: 0.4 ms
Lead Channel Setting Sensing Sensitivity: 4 mV
MDC IDC MSMT LEADCHNL RA IMPEDANCE VALUE: 559 Ohm
MDC IDC MSMT LEADCHNL RA PACING THRESHOLD AMPLITUDE: 0.5 V
MDC IDC MSMT LEADCHNL RV IMPEDANCE VALUE: 582 Ohm
MDC IDC SET LEADCHNL RA PACING AMPLITUDE: 2 V
MDC IDC STAT BRADY AP VP PERCENT: 0 %

## 2014-07-03 NOTE — Patient Instructions (Signed)
Your physician has recommended you make the following change in your medication:   Increase lasix to 40 mg by mouth daily for two days, then back to regular dose  Your physician recommends that you have follow-up appointment on 07/21/2014 at 11:00 am with Dr. Rayann Heman  Your physician recommends that you have labs today, BMET and BNP  Your physician recommends that you return for lab work in: one week for [employer]

## 2014-07-03 NOTE — Progress Notes (Signed)
Cardiology Office Note   Date:  07/03/2014   ID:  WAYLON SAMUELSON, DOB 1919-05-07, MRN [id number]  PCP:  [name], MD  Cardiologist/Electrophysiologist:  Dr. [name]   Chief Complaint  Patient presents with  . Chest Pain  . Shortness of Breath     History of Present Illness: Jordan [name] is a 81 y.o. female with a hx of HTN, COPD, CKD, DJD, unexplained syncope s/p ILR.She was admitted 07/2013 with symptomatic Mobitz II 2nd degree HB and underwent implantation of a Medtronic Adapta L dual-chamber pacemaker by Dr. [name] on 08/30/13. Hospitalization was c/b a/c diastolic CHF.    Saw PCP recently with c/o's of swelling over pacer site.  She preferred to be referred to a new cardiologist and was placed on my schedule today.  She felt an enlargement or swelling over her pacer site ~ 2 weeks ago.  Since that time, she has noted a strange sensation substernally.  This is mainly with lying flat.  She describes it as something opening.  She denies orthopnea, PND.  She denies edema. She denies chest pain with activity.  She is fairly sedentary.  She has been more short of breath.  She is NYHA 3.  She denies syncope.  Her pacemaker was checked a couple of weeks ago.  She was pacing most of the time and her device was adjusted to allow intrinsic conduction.     Studies: - Echo (08/30/13): Mild LVH, EF 60-65%, normal wall motion, grade 1 diastolic dysfunction, trivial AI, MAC, trivial MR, moderate LAE, mild RAE - Carotid US (01/2013): Bilateral ICA 1-39%   Past Medical History  Diagnosis Date  . Cervicalgia   . Peripheral neuropathy   . Hypertension   . Osteoporosis   . Insomnia   . Arthritis   . Headache   . Hypothyroidism   . Right rotator cuff tear   . OA (osteoarthritis)     RIGHT SHOULDER AC JOINT  . Moderate dementia   . Gait instability   . Complete heart block   . History of colon polyps   . Diverticulosis of colon   . SUI (stress urinary incontinence, female)    . Lumbar stenosis   . Cryptogenic stroke S/P  LOOP RECORDER IMPLANT    DX  01/2013  --  SYNCOPAL EPISODE --  EPISODIC  ATRIAL TACHY/ FIBULATION  AND PAUSES  . Frequency of urination   . Chronic constipation   . Glaucoma of both eyes   . Hearing loss of both ears     no hearing aids  . Dry eyes   . Cervicalgia 05/18/2009    Qualifier: Diagnosis of  By: Walker Kehr MD, Patrick Jupiter    . DIVERTICULOSIS OF COLON 06/29/2006    Qualifier: Diagnosis of  By: Benna Dunks    . Combined visual hearing impairment 11/12/2013  . Urinary incontinence, mixed 06/29/2006    Qualifier: Diagnosis of  By: Benna Dunks    . INTERSTITIAL CYSTITIS 09/21/2009    Qualifier: Diagnosis of  By: Sarita Haver  MD, Coralyn Mark    . Syncope and collapse 02/09/2013  . Right knee pain 01/15/2014  . Polymyalgia rheumatica 11/08/2013  . Loss of weight 04/05/2010    Qualifier: Diagnosis of  By: Walker Kehr MD, Patrick Jupiter    . Macular degeneration, age related, nonexudative 04/18/2014  . Hearing loss of aging 05/09/2014    Past Surgical History  Procedure Laterality Date  . Loop recorder implant  02-08-13; 08-30-13    MDT  LinQ implanted by Dr Lovena Le for syncope, explanted 08-30-13 after CHB identified  . Total abdominal hysterectomy w/ bilateral salpingoophorectomy  03-15-2005  . Carpal tunnel release Right   . Eye surgery      cataract, bilat.  . Pacemaker insertion  08-30-2013    MDT ADDRL1 pacemaker implanted by Dr Rayann Heman for CHB  . Loop recorder implant N/A 02/11/2013    Procedure: LOOP RECORDER IMPLANT;  Surgeon: Evans Lance, MD;  Location: [facility] CATH LAB;  Service: Cardiovascular;  Laterality: N/A;  . Permanent pacemaker insertion N/A 08/30/2013    Procedure: PERMANENT PACEMAKER INSERTION;  Surgeon: Coralyn Mark, MD;  Location: [facility] CATH LAB;  Service: Cardiovascular;  Laterality: N/A;     Current Outpatient Prescriptions  Medication Sig Dispense Refill  . acetaminophen (TYLENOL) 325 MG tablet Take 650 mg by mouth every 6 (six) hours as needed for mild  pain.     Marland Kitchen allopurinol (ZYLOPRIM) 100 MG tablet Take 1 tablet (100 mg total) by mouth daily. 30 tablet 12  . beta carotene w/minerals (OCUVITE) tablet Take 1 tablet by mouth daily. 90 tablet 3  . Calcium Carbonate-Vitamin D (CALTRATE 600+D) 600-400 MG-UNIT per tablet Take 1 tablet by mouth 2 (two) times daily.      . cloNIDine (CATAPRES) 0.1 MG tablet take 1/2 tablet by mouth twice a day 60 tablet 1  . colchicine (COLCRYS) 0.6 MG tablet Take 1 tablet (0.6 mg total) by mouth 2 (two) times daily. (Patient taking differently: Take 0.6 mg by mouth as needed. ) 60 tablet 3  . diclofenac sodium (VOLTAREN) 1 % GEL Apply 2 g topically 4 (four) times daily as needed (pain). 100 g 0  . DULoxetine (CYMBALTA) 60 MG capsule Take 1 capsule (60 mg total) by mouth daily. 30 capsule 1  . furosemide (LASIX) 20 MG tablet Take 1 tablet (20 mg total) by mouth daily. 30 tablet 12  . gabapentin (NEURONTIN) 100 MG capsule Take 2 capsules (200 mg total) by mouth 3 (three) times daily. 180 capsule 6  . Glucosamine-Chondroitin-Vit D3 1500-1200-800 MG-MG-UNIT PACK Take 1 tablet by mouth 2 (two) times daily.      Marland Kitchen levothyroxine (SYNTHROID, LEVOTHROID) 100 MCG tablet Take 1 tablet (100 mcg total) by mouth daily. 30 tablet 1  . lidocaine (LIDODERM) 5 % Place 1 patch onto the skin daily. Remove & Discard patch within 12 hours or as directed by MD 30 patch 0  . oxyCODONE-acetaminophen (PERCOCET/ROXICET) 5-325 MG per tablet Take 1 tablet by mouth every 8 (eight) hours as needed for severe pain. 60 tablet 0  . polyethylene glycol (MIRALAX) packet Take 17 g by mouth daily. 14 each 0  . RA ASPIRIN EC 81 MG EC tablet take 1 tablet by mouth once daily 30 tablet 5  . RESTASIS 0.05 % ophthalmic emulsion Place 1 drop into both eyes daily.     . TOVIAZ 8 MG TB24 tablet Take 8 mg by mouth daily.   0  . TRAVATAN Z 0.004 % SOLN ophthalmic solution Place 1 drop into both eyes at bedtime.     . traZODone (DESYREL) 50 MG tablet take 1 tablet  by mouth at bedtime 30 tablet 3  . Vitamin D, Ergocalciferol, (DRISDOL) 50000 UNITS CAPS capsule Take 50,000 Units by mouth every 7 (seven) days.    Marland Kitchen SIMBRINZA 1-0.2 % SUSP Apply 1 drop to eye 2 (two) times daily. BOTH EYES     No current facility-administered medications for this visit.    Allergies:  Amlodipine; Chlorthalidone; Verapamil; Adhesive; and Honey bee treatment    Social History:  The patient  reports that she has never smoked. She has never used smokeless tobacco. She reports that she does not drink alcohol or use illicit drugs.   Family History:  The patient's family history includes Kidney disease in her mother. There is no history of Heart attack or Stroke.    ROS:   Please see the history of present illness.   Review of Systems  Constitution: Positive for malaise/fatigue.  Respiratory: Positive for cough and shortness of breath.   Musculoskeletal: Positive for back pain.  Neurological: Positive for dizziness and loss of balance.  Psychiatric/Behavioral: Positive for depression.  All other systems reviewed and are negative.     PHYSICAL EXAM: VS:  BP 150/60 mmHg  Pulse 72  Ht 4\' 7"  (1.397 m)  Wt 133 lb (60.328 kg)  BMI 30.91 kg/m2    Wt Readings from Last 3 Encounters:  07/03/14 133 lb (60.328 kg)  06/24/14 133 lb 9 oz (60.584 kg)  06/18/14 133 lb 12.8 oz (60.691 kg)     GEN: Well nourished, well developed, in no acute distress HEENT: normal Neck: mild JVD 90 degrees, no masses Cardiac:  Normal S1/S2, RRR; no murmur, no rubs or gallops, no edema  Respiratory:  Decreased breath sounds with faint bibasilar crackles. GI: soft, nontender, nondistended, + BS MS: no deformity or atrophy Skin: warm and dry  Neuro:  CNs II-XII intact, Strength and sensation are intact Psych: Normal affect   EKG:  EKG is ordered today.  It demonstrates:   NSR, HR 72, normal axis, TWI in 1, aVL, V4-6   Recent Labs: 08/29/2013: Magnesium 2.3 08/30/2013: ALT 93* 09/02/2013:  Pro B Natriuretic peptide (BNP) 10568.0* 04/17/2014: BUN 34*; Creatinine 1.07; Potassium 4.3; Sodium 138 05/08/2014: TSH 0.262* 06/25/2014: Hemoglobin 11.9*; Platelets 222    Lipid Panel    Component Value Date/Time   CHOL 240* 08/28/2012 1111   TRIG 230* 08/28/2012 1111   HDL 61 08/28/2012 1111   CHOLHDL 3.9 08/28/2012 1111   VLDL 46* 08/28/2012 1111   LDLCALC 133* 08/28/2012 1111      ASSESSMENT AND PLAN:  Shortness of breath and Chest pain:  She had a strange sensation over her pacer several weeks ago.  Since that time, she has had an unusual sensation especially with lying down.  She had a CXR done recently by her PCP.  The device appears to be rotated compared to her previous film.  I had her pacer interrogated today.  It is functioning appropriately.  She is pacing just 1% of the time.  I reviewed her CXR with Dr. Cristopher Peru, who was in the office today.  Her device appears to be positioned differently.  But, the leads are in place without abnormality.  The patient's symptoms do not seem to be coming from her pacer.  However, she does have symptoms of HF. There is some edema on her CXR.    -  Increase Lasix to 40 mg QD x 2 days.    -  BMET, BNP today.    -  BMET 1 week.  Chronic diastolic heart failure:  As noted, I think she is volume overloaded.  Adjust Lasix as noted above.    Mobitz type 2 second degree heart block s/p Pacemaker:  FU with EP as planned.   Essential hypertension:  BP borderline.  Adjust diuretics as noted.    CKD (chronic kidney disease) stage 3, GFR  30-59 ml/min:  Monitor renal function as noted above.  Current medicines are reviewed at length with the patient today.  The patient does not have concerns regarding medicines.  The following changes have been made:  Increase Lasix x 2 days as noted.   Labs/ tests ordered today include:   Orders Placed This Encounter  Procedures  . Basic Metabolic Panel (BMET)  . B Nat Peptide  . Basic Metabolic Panel  (BMET)  . EKG 12-Lead     Disposition:   FU with Dr. Jeneen Rinks Allred 2-3 weeks.    Signed, Versie Starks, MHS 07/03/2014 3:45 PM    [facility] [place], [place], [place]  [address] Phone: [phone]; Fax: [phone]

## 2014-07-03 NOTE — Progress Notes (Signed)
Add-on pacemaker check in clinic---seing Scott. Normal device function. Thresholds, sensing, impedances consistent with previous measurements. Device programmed to maximize longevity. No mode switch or high ventricular rates noted. Device programmed at appropriate safety margins. Histogram distribution appropriate for patient activity level. Device programmed to optimize intrinsic conduction. Estimated longevity 13.54yrs. Re-establish w/ Dr. Rayann Heman 07/21/14 per request of pt's daughter.

## 2014-07-04 ENCOUNTER — Telehealth: Payer: Self-pay | Admitting: Internal Medicine

## 2014-07-04 ENCOUNTER — Telehealth: Payer: Self-pay | Admitting: *Deleted

## 2014-07-04 ENCOUNTER — Telehealth: Payer: Self-pay | Admitting: Family Medicine

## 2014-07-04 DIAGNOSIS — G894 Chronic pain syndrome: Secondary | ICD-10-CM | POA: Diagnosis not present

## 2014-07-04 DIAGNOSIS — M4806 Spinal stenosis, lumbar region: Secondary | ICD-10-CM | POA: Diagnosis not present

## 2014-07-04 DIAGNOSIS — M25511 Pain in right shoulder: Secondary | ICD-10-CM | POA: Diagnosis not present

## 2014-07-04 DIAGNOSIS — I5032 Chronic diastolic (congestive) heart failure: Secondary | ICD-10-CM | POA: Diagnosis not present

## 2014-07-04 DIAGNOSIS — I441 Atrioventricular block, second degree: Secondary | ICD-10-CM | POA: Diagnosis not present

## 2014-07-04 DIAGNOSIS — G309 Alzheimer's disease, unspecified: Secondary | ICD-10-CM | POA: Diagnosis not present

## 2014-07-04 LAB — BASIC METABOLIC PANEL
BUN: 36 mg/dL — ABNORMAL HIGH (ref 6–23)
CO2: 32 mEq/L (ref 19–32)
Calcium: 9.9 mg/dL (ref 8.4–10.5)
Chloride: 95 mEq/L — ABNORMAL LOW (ref 96–112)
Creatinine, Ser: 1.27 mg/dL — ABNORMAL HIGH (ref 0.40–1.20)
GFR: 41.74 mL/min — ABNORMAL LOW (ref >60.00)
Glucose, Bld: 92 mg/dL (ref 70–99)
Potassium: 3.9 mEq/L (ref 3.5–5.1)
Sodium: 134 mEq/L — ABNORMAL LOW (ref 135–145)

## 2014-07-04 LAB — BRAIN NATRIURETIC PEPTIDE: Pro B Natriuretic peptide (BNP): 183 pg/mL — ABNORMAL HIGH (ref 0.0–100.0)

## 2014-07-04 NOTE — Telephone Encounter (Signed)
Called Akika and gave verbal orders for BP monitoring. She expressed concern about her low readings and would like to know if the patient should be seen or make any changes in medications?

## 2014-07-04 NOTE — Telephone Encounter (Signed)
Will forward to PCP for review. Please see below and advise. Shaquayla Klimas, CMA. 

## 2014-07-04 NOTE — Telephone Encounter (Signed)
Pt c/o BP issue: STAT if pt c/o blurred vision, one-sided weakness or slurred speech  1. What are your last 5 BP readings? Between 90/40 and 98/46  2. Are you having any other symptoms (ex. Dizziness, headache, blurred vision, passed out)? No   3. What is your BP issue? BP is going up and down   Per [facility] calling want to know if it's ok for pt's BP to go up and down. Please advise.

## 2014-07-04 NOTE — Telephone Encounter (Signed)
pts daughter would like to speak with pcp re: pts bp, says it has been staying low

## 2014-07-04 NOTE — Telephone Encounter (Signed)
Called patients daughter to discuss blood pressure. Cardiology told to give a double dose of lasix for 2 days because she had slight volume overload. Advised that they had diminished kidney function. Blood pressure has been up and down within short period of time, 98/50 earlier today. Prior to this it was 150/65. Then checked again was 143/60. Had not taken extra lasix yet. No light headedness, CP, or SOB. I discussed the options of watching her to see if this continues to occur and decreasing the clonidine if this continues to occur vs decreasing clonidine to once daily at this time. The daughter opted to monitor and if continues to have BPs in the [id number] systolic she will decrease to once daily clonidine. I advised the daughter that the patient should go to the ED if she develops light headedness, dyspnea, or chest pain. And advised that she should call our after hours lines if she has any concerns over the weekend. I also responded to a message from [facility] asking for verbal order to check her blood pressure. I sent this back to [facility] nursing to give a verbal order for this.

## 2014-07-04 NOTE — Telephone Encounter (Signed)
LMTCB

## 2014-07-04 NOTE — Telephone Encounter (Signed)
Will forward to PCP. Jordan [name], CMA.

## 2014-07-04 NOTE — Telephone Encounter (Signed)
Please call them and give verbal orders for checking blood pressure. Thanks.

## 2014-07-04 NOTE — Telephone Encounter (Signed)
Akiko a Marine scientist from [facility] called asking if they can have verbal orders to do BP reading on the patient. Today BP was very low and keeps fluctuating 90/40 98/40 Please call Akiko with orders and questions. jw

## 2014-07-04 NOTE — Telephone Encounter (Signed)
pt's daughter [facility] DPR on file; notified of lab results and to continue as planned at [facility] yesterday with [place]. BMET 3/10. Daughter verablized understanding to plan of care.

## 2014-07-04 NOTE — Telephone Encounter (Signed)
I spoke to the patients daughter earlier and advised that if the blood pressure readings continue to be in the [id number] systolic they should decrease her clonidine to once a day and if they continue to be low with that change they should call our after hours line over the weekend for further advice. Please inform [facility] nursing of this. Thanks.

## 2014-07-04 NOTE — Telephone Encounter (Signed)
Physical Therapist from [facility]) reporting that patient is having episodes of flunctuating BP with low's at 90/40 and 98/46. She states that patient is asymptomatic with these BP's. She just felt physician should be aware that this has come up before over the past few weeks. She is wondering if having an order for a nurse visit to monitor BP would be appropriate/possible. Will route to Dr. Rayann Heman. Again, patient is currently asymptomatic. Denies dizziness, anorexia, dehydration or other sx. Education provided regarding that when BP is in lower range patient will need to be alert for onset of dizziness and should sit and elevate legs and make sure she is hydrated, as appropriate. Akiko, PT, states she will share the information with the patient's daughter. Daughter will monitor patient's BP over the weekend and certain access emergent care should patient experience symptomology or further decrease in BP.

## 2014-07-04 NOTE — Telephone Encounter (Signed)
Gave message to [facility] nurse.Lamir Racca, Kevin Fenton

## 2014-07-05 NOTE — Telephone Encounter (Signed)
Given her advanced age and fragility, I doubt that there is much to be done.  Follow-up with primary care.  I will see as scheduled.

## 2014-07-07 NOTE — Telephone Encounter (Signed)
lmtcb to [place], RN - [facility], for purposes to relay message from Dr. Rayann Heman.

## 2014-07-08 DIAGNOSIS — M4806 Spinal stenosis, lumbar region: Secondary | ICD-10-CM | POA: Diagnosis not present

## 2014-07-08 DIAGNOSIS — M25511 Pain in right shoulder: Secondary | ICD-10-CM | POA: Diagnosis not present

## 2014-07-08 DIAGNOSIS — I441 Atrioventricular block, second degree: Secondary | ICD-10-CM | POA: Diagnosis not present

## 2014-07-08 DIAGNOSIS — G309 Alzheimer's disease, unspecified: Secondary | ICD-10-CM | POA: Diagnosis not present

## 2014-07-08 DIAGNOSIS — G894 Chronic pain syndrome: Secondary | ICD-10-CM | POA: Diagnosis not present

## 2014-07-08 DIAGNOSIS — I5032 Chronic diastolic (congestive) heart failure: Secondary | ICD-10-CM | POA: Diagnosis not present

## 2014-07-08 NOTE — Telephone Encounter (Signed)
Akiko, RN, [facility], called back. Informed her of Dr. Jackalyn Lombard response to previous pt status of flunctuating BP. Akiko verbalized understanding of MD advisement.

## 2014-07-08 NOTE — Telephone Encounter (Signed)
lmtcb 2nd attempt to [place], [facility], for purposes to relay message from Dr. Rayann Heman.

## 2014-07-09 DIAGNOSIS — G894 Chronic pain syndrome: Secondary | ICD-10-CM | POA: Diagnosis not present

## 2014-07-09 DIAGNOSIS — M25511 Pain in right shoulder: Secondary | ICD-10-CM | POA: Diagnosis not present

## 2014-07-09 DIAGNOSIS — I5032 Chronic diastolic (congestive) heart failure: Secondary | ICD-10-CM | POA: Diagnosis not present

## 2014-07-09 DIAGNOSIS — G309 Alzheimer's disease, unspecified: Secondary | ICD-10-CM | POA: Diagnosis not present

## 2014-07-09 DIAGNOSIS — M4806 Spinal stenosis, lumbar region: Secondary | ICD-10-CM | POA: Diagnosis not present

## 2014-07-09 DIAGNOSIS — I441 Atrioventricular block, second degree: Secondary | ICD-10-CM | POA: Diagnosis not present

## 2014-07-10 ENCOUNTER — Telehealth: Payer: Self-pay | Admitting: Family Medicine

## 2014-07-10 ENCOUNTER — Other Ambulatory Visit (INDEPENDENT_AMBULATORY_CARE_PROVIDER_SITE_OTHER): Payer: Medicare Other

## 2014-07-10 DIAGNOSIS — I5032 Chronic diastolic (congestive) heart failure: Secondary | ICD-10-CM | POA: Diagnosis not present

## 2014-07-10 DIAGNOSIS — M4806 Spinal stenosis, lumbar region: Secondary | ICD-10-CM | POA: Diagnosis not present

## 2014-07-10 DIAGNOSIS — I441 Atrioventricular block, second degree: Secondary | ICD-10-CM | POA: Diagnosis not present

## 2014-07-10 DIAGNOSIS — G894 Chronic pain syndrome: Secondary | ICD-10-CM | POA: Diagnosis not present

## 2014-07-10 DIAGNOSIS — M25511 Pain in right shoulder: Secondary | ICD-10-CM | POA: Diagnosis not present

## 2014-07-10 DIAGNOSIS — G309 Alzheimer's disease, unspecified: Secondary | ICD-10-CM | POA: Diagnosis not present

## 2014-07-10 NOTE — Telephone Encounter (Signed)
Jordan [name] with [facility] reports pt bp yesterday when sitting was 144/68. When she stood up it dropped to 110/60. Just wnanted dr to know

## 2014-07-11 LAB — BASIC METABOLIC PANEL
BUN: 36 mg/dL — AB (ref 6–23)
CO2: 34 mEq/L — ABNORMAL HIGH (ref 19–32)
Calcium: 9.7 mg/dL (ref 8.4–10.5)
Chloride: 98 mEq/L (ref 96–112)
Creatinine, Ser: 1.32 mg/dL — ABNORMAL HIGH (ref 0.40–1.20)
GFR: 39.92 mL/min — ABNORMAL LOW (ref >60.00)
Glucose, Bld: 132 mg/dL — ABNORMAL HIGH (ref 70–99)
POTASSIUM: 4.1 meq/L (ref 3.5–5.1)
SODIUM: 137 meq/L (ref 135–145)

## 2014-07-11 NOTE — Telephone Encounter (Addendum)
Still getting clonidine BID. BP has been occassionally in the 175/76 though can also be in the 0000000 range systolic in the same day, so they had continued the BID dosing. This is quite a large drop and I am concerned that she is getting light headed and at increased risk for falls with this. I advised the patients daughter to decreased her already small dose of clonidine to 0.25 mg daily for 2 days and then discontinue the medication. They are to continue to check blood pressure, though I advised that they could check it once a day as opposed to 3 times a day. I explained that everyones blood pressure varies throughout the day and this likely is what they are seeing with her widely variably blood pressures.

## 2014-07-15 ENCOUNTER — Telehealth: Payer: Self-pay | Admitting: Family Medicine

## 2014-07-15 DIAGNOSIS — M4806 Spinal stenosis, lumbar region: Secondary | ICD-10-CM | POA: Diagnosis not present

## 2014-07-15 DIAGNOSIS — M25511 Pain in right shoulder: Secondary | ICD-10-CM | POA: Diagnosis not present

## 2014-07-15 DIAGNOSIS — I441 Atrioventricular block, second degree: Secondary | ICD-10-CM | POA: Diagnosis not present

## 2014-07-15 DIAGNOSIS — G309 Alzheimer's disease, unspecified: Secondary | ICD-10-CM | POA: Diagnosis not present

## 2014-07-15 DIAGNOSIS — G894 Chronic pain syndrome: Secondary | ICD-10-CM | POA: Diagnosis not present

## 2014-07-15 DIAGNOSIS — I5032 Chronic diastolic (congestive) heart failure: Secondary | ICD-10-CM | POA: Diagnosis not present

## 2014-07-15 NOTE — Telephone Encounter (Signed)
Will forward to MD. Jordan [name],CMA  

## 2014-07-15 NOTE — Telephone Encounter (Signed)
You can give them verbal orders. Thanks.

## 2014-07-15 NOTE — Telephone Encounter (Signed)
Needs verbal orders to continue PT

## 2014-07-15 NOTE — Telephone Encounter (Signed)
Spoke with [employer] and verbal orders given for PT x 1 week.  She is going to show caregiver how to manage pain better.  Amyah Clawson,CMA

## 2014-07-17 ENCOUNTER — Telehealth: Payer: Self-pay | Admitting: Family Medicine

## 2014-07-17 DIAGNOSIS — G894 Chronic pain syndrome: Secondary | ICD-10-CM | POA: Diagnosis not present

## 2014-07-17 DIAGNOSIS — M4806 Spinal stenosis, lumbar region: Secondary | ICD-10-CM | POA: Diagnosis not present

## 2014-07-17 DIAGNOSIS — I5032 Chronic diastolic (congestive) heart failure: Secondary | ICD-10-CM | POA: Diagnosis not present

## 2014-07-17 DIAGNOSIS — G309 Alzheimer's disease, unspecified: Secondary | ICD-10-CM | POA: Diagnosis not present

## 2014-07-17 DIAGNOSIS — M25511 Pain in right shoulder: Secondary | ICD-10-CM | POA: Diagnosis not present

## 2014-07-17 DIAGNOSIS — I441 Atrioventricular block, second degree: Secondary | ICD-10-CM | POA: Diagnosis not present

## 2014-07-17 MED ORDER — LIDOCAINE 5 % EX PTCH: 1.0000 | MEDICATED_PATCH | CUTANEOUS | Status: DC

## 2014-07-17 NOTE — Telephone Encounter (Signed)
Called patients daughter back to discuss pain management after receiving call from [facility]. She states her pain medication is not making any difference. She intermittently takes percocet every 8 hours for 2-3 days at a time. Takes scheduled tylenol. Has been on duloxetine. Voltaren does not help much. Has tried tramadol with no benefit. Has seen pain management in the past with little benefit. Had injection in right shoulder with little benefit recently. Lidoderm patches have been the only thing that has been beneficial and these have been difficult to obtain due to insurance issues. The daughter asked if we could increase the pain medication dosage. I discussed that increasing to 10 mg oxycodone would not be a good idea due to the high dosage and the possible side effects of that amount in one dosage. I advised that we could give a trial of percocet 5-325 q6 hrs prn for 1-2 days to see if this would be beneficial. I advised of possible adverse reactions with this medication and gave return precautions. I think the best option would be to get lidoderm patches as these have been most beneficial in the past. I sent these to the pharmacy and will fill out PA once this comes to my box.

## 2014-07-17 NOTE — Telephone Encounter (Signed)
Will forward to PCP for advice. Christ Fullenwider, CMA. 

## 2014-07-17 NOTE — Telephone Encounter (Signed)
[facility]: pts pain isnt being helped with pain medicaton

## 2014-07-18 ENCOUNTER — Encounter: Payer: Self-pay | Admitting: Internal Medicine

## 2014-07-21 ENCOUNTER — Encounter: Payer: Self-pay | Admitting: Internal Medicine

## 2014-07-21 ENCOUNTER — Ambulatory Visit (INDEPENDENT_AMBULATORY_CARE_PROVIDER_SITE_OTHER): Payer: Medicare Other | Admitting: Internal Medicine

## 2014-07-21 ENCOUNTER — Telehealth: Payer: Self-pay | Admitting: *Deleted

## 2014-07-21 VITALS — BP 148/70 | HR 83 | Ht <= 58 in | Wt 133.0 lb

## 2014-07-21 DIAGNOSIS — I441 Atrioventricular block, second degree: Secondary | ICD-10-CM

## 2014-07-21 DIAGNOSIS — R079 Chest pain, unspecified: Secondary | ICD-10-CM | POA: Diagnosis not present

## 2014-07-21 DIAGNOSIS — R0602 Shortness of breath: Secondary | ICD-10-CM

## 2014-07-21 LAB — MDC_IDC_ENUM_SESS_TYPE_INCLINIC
Battery Impedance: 113 Ohm
Battery Remaining Longevity: 151 mo
Brady Statistic AP VP Percent: 0 %
Brady Statistic AS VP Percent: 26 %
Date Time Interrogation Session: 20160321142432
Lead Channel Impedance Value: 555 Ohm
Lead Channel Impedance Value: 568 Ohm
Lead Channel Pacing Threshold Amplitude: 0.75 V
Lead Channel Pacing Threshold Pulse Width: 0.4 ms
Lead Channel Sensing Intrinsic Amplitude: 8 mV
Lead Channel Setting Pacing Amplitude: 2 V
Lead Channel Setting Sensing Sensitivity: 4 mV
MDC IDC MSMT BATTERY VOLTAGE: 2.79 V
MDC IDC MSMT LEADCHNL RA PACING THRESHOLD AMPLITUDE: 0.5 V
MDC IDC MSMT LEADCHNL RA SENSING INTR AMPL: 2 mV
MDC IDC MSMT LEADCHNL RV PACING THRESHOLD PULSEWIDTH: 0.4 ms
MDC IDC SET LEADCHNL RV PACING AMPLITUDE: 2.5 V
MDC IDC SET LEADCHNL RV PACING PULSEWIDTH: 0.4 ms
MDC IDC STAT BRADY AP VS PERCENT: 3 %
MDC IDC STAT BRADY AS VS PERCENT: 70 %

## 2014-07-21 MED ORDER — PANTOPRAZOLE SODIUM 40 MG PO TBEC: 40.0000 mg | DELAYED_RELEASE_TABLET | Freq: Every day | ORAL | Status: DC

## 2014-07-21 NOTE — Progress Notes (Signed)
Electrophysiology Office Note   Date:  07/21/2014   ID:  Teondra, Langin 1919/10/23, MRN [id number]  PCP:  [name], MD   Primary Electrophysiologist: [name], MD    Chief Complaint  Patient presents with  . Follow-up    Mobitz type II AV block     History of Present Illness: Jordan [name] is a 81 y.o. female who presents today for electrophysiology evaluation.   She presents with her family today.  They have multiple concerns.  She has had poor appetite recently with nausea as well as epigastric discomfort.  She has SOB chronically.  She is not eating very well and has failure to thrive.  She has been having R shoulder pain chronically (> 1 year).  She also has constipation.  She is unsteady at times.   Today, she denies symptoms of palpitations,   orthopnea, PND, lower extremity edema, claudication,  presyncope, syncope, bleeding, or neurologic sequela. The patient is tolerating medications without difficulties and is otherwise without complaint today.    Past Medical History  Diagnosis Date  . Cervicalgia   . Peripheral neuropathy   . Hypertension   . Osteoporosis   . Insomnia   . Arthritis   . Headache   . Hypothyroidism   . Right rotator cuff tear   . OA (osteoarthritis)     RIGHT SHOULDER AC JOINT  . Moderate dementia   . Gait instability   . Complete heart block   . History of colon polyps   . Diverticulosis of colon   . SUI (stress urinary incontinence, female)   . Lumbar stenosis   . Cryptogenic stroke S/P  LOOP RECORDER IMPLANT    DX  01/2013  --  SYNCOPAL EPISODE --  EPISODIC  ATRIAL TACHY/ FIBULATION  AND PAUSES  . Frequency of urination   . Chronic constipation   . Glaucoma of both eyes   . Hearing loss of both ears     no hearing aids  . Dry eyes   . Cervicalgia 05/18/2009    Qualifier: Diagnosis of  By: Walker Kehr MD, Patrick Jupiter    . DIVERTICULOSIS OF COLON 06/29/2006    Qualifier: Diagnosis of  By: Benna Dunks    . Combined visual hearing  impairment 11/12/2013  . Urinary incontinence, mixed 06/29/2006    Qualifier: Diagnosis of  By: Benna Dunks    . INTERSTITIAL CYSTITIS 09/21/2009    Qualifier: Diagnosis of  By: Sarita Haver  MD, Coralyn Mark    . Syncope and collapse 02/09/2013  . Right knee pain 01/15/2014  . Polymyalgia rheumatica 11/08/2013  . Loss of weight 04/05/2010    Qualifier: Diagnosis of  By: Walker Kehr MD, Patrick Jupiter    . Macular degeneration, age related, nonexudative 04/18/2014  . Hearing loss of aging 05/09/2014   Past Surgical History  Procedure Laterality Date  . Loop recorder implant  02-08-13; 08-30-13    MDT LinQ implanted by Dr Lovena Le for syncope, explanted 08-30-13 after CHB identified  . Total abdominal hysterectomy w/ bilateral salpingoophorectomy  03-15-2005  . Carpal tunnel release Right   . Eye surgery      cataract, bilat.  . Pacemaker insertion  08-30-2013    MDT ADDRL1 pacemaker implanted by Dr Rayann Heman for CHB  . Loop recorder implant N/A 02/11/2013    Procedure: LOOP RECORDER IMPLANT;  Surgeon: Evans Lance, MD;  Location: [facility] CATH LAB;  Service: Cardiovascular;  Laterality: N/A;  . Permanent pacemaker insertion N/A 08/30/2013    Procedure:  PERMANENT PACEMAKER INSERTION;  Surgeon: Coralyn Mark, MD;  Location: [facility] CATH LAB;  Service: Cardiovascular;  Laterality: N/A;     Current Outpatient Prescriptions  Medication Sig Dispense Refill  . acetaminophen (TYLENOL) 325 MG tablet Take 650 mg by mouth every 6 (six) hours as needed for mild pain.     Marland Kitchen allopurinol (ZYLOPRIM) 100 MG tablet Take 1 tablet (100 mg total) by mouth daily. 30 tablet 12  . beta carotene w/minerals (OCUVITE) tablet Take 1 tablet by mouth daily. 90 tablet 3  . Calcium Carbonate-Vitamin D (CALTRATE 600+D) 600-400 MG-UNIT per tablet Take 1 tablet by mouth 2 (two) times daily.      . colchicine 0.6 MG tablet Take 0.6 mg by mouth 2 (two) times daily as needed (GOUT).    Marland Kitchen diclofenac sodium (VOLTAREN) 1 % GEL Apply 2 g topically 4 (four) times daily as  needed (pain). 100 g 0  . DULoxetine (CYMBALTA) 60 MG capsule Take 1 capsule (60 mg total) by mouth daily. 30 capsule 1  . furosemide (LASIX) 20 MG tablet Take 1 tablet (20 mg total) by mouth daily. 30 tablet 12  . gabapentin (NEURONTIN) 100 MG capsule Take 2 capsules (200 mg total) by mouth 3 (three) times daily. 180 capsule 6  . Glucosamine-Chondroitin-Vit D3 1500-1200-800 MG-MG-UNIT PACK Take 1 tablet by mouth 2 (two) times daily.      Marland Kitchen levothyroxine (SYNTHROID, LEVOTHROID) 100 MCG tablet Take 1 tablet (100 mcg total) by mouth daily. 30 tablet 1  . oxyCODONE-acetaminophen (PERCOCET/ROXICET) 5-325 MG per tablet Take 1 tablet by mouth every 8 (eight) hours as needed for severe pain. 60 tablet 0  . polyethylene glycol (MIRALAX) packet Take 17 g by mouth daily. 14 each 0  . RA ASPIRIN EC 81 MG EC tablet take 1 tablet by mouth once daily 30 tablet 5  . RESTASIS 0.05 % ophthalmic emulsion Place 1 drop into both eyes daily.     Marland Kitchen SIMBRINZA 1-0.2 % SUSP Apply 1 drop to eye 2 (two) times daily. BOTH EYES    . TOVIAZ 8 MG TB24 tablet Take 8 mg by mouth daily.   0  . TRAVATAN Z 0.004 % SOLN ophthalmic solution Place 1 drop into both eyes at bedtime.     . traZODone (DESYREL) 50 MG tablet take 1 tablet by mouth at bedtime 30 tablet 3  . Vitamin D, Ergocalciferol, (DRISDOL) 50000 UNITS CAPS capsule Take 50,000 Units by mouth every 7 (seven) days.    . pantoprazole (PROTONIX) 40 MG tablet Take 1 tablet (40 mg total) by mouth daily. 30 tablet 11   No current facility-administered medications for this visit.    Allergies:   Amlodipine; Chlorthalidone; Verapamil; Adhesive; and Honey bee treatment   Social History:  The patient  reports that she has never smoked. She has never used smokeless tobacco. She reports that she does not drink alcohol or use illicit drugs.   Family History:  The patient's family history includes Kidney disease in her mother. There is no history of Heart attack or Stroke.     ROS:  Please see the history of present illness.   All other systems are reviewed and negative.    PHYSICAL EXAM: VS:  BP 148/70 mmHg  Pulse 83  Ht 4\' 7"  (1.397 m)  Wt 133 lb (60.328 kg)  BMI 30.91 kg/m2 , BMI Body mass index is 30.91 kg/(m^2). GEN: elderly, in no acute distress HEENT: normal Neck: no JVD, carotid bruits, or masses Cardiac:  RRR; no murmurs, rubs, or gallops,no edema  Respiratory:  clear to auscultation bilaterally, normal work of breathing GI: soft, nontender, nondistended, + BS MS: no deformity or atrophy Skin: warm and dry, device pocket is well healed Neuro:  Strength and sensation are intact Psych: euthymic mood, full affect  EKG:  07/03/14 is reviewed  Device interrogation is reviewed today in detail.  See PaceArt for details.   Recent Labs: 08/29/2013: Magnesium 2.3 08/30/2013: ALT 93* 05/08/2014: TSH 0.262* 06/25/2014: Hemoglobin 11.9*; Platelets 222 07/03/2014: Pro B Natriuretic peptide (BNP) 183.0* 07/10/2014: BUN 36*; Creatinine 1.32*; Potassium 4.1; Sodium 137    Lipid Panel     Component Value Date/Time   CHOL 240* 08/28/2012 1111   TRIG 230* 08/28/2012 1111   HDL 61 08/28/2012 1111   CHOLHDL 3.9 08/28/2012 1111   VLDL 46* 08/28/2012 1111   LDLCALC 133* 08/28/2012 1111     Wt Readings from Last 3 Encounters:  07/21/14 133 lb (60.328 kg)  07/03/14 133 lb (60.328 kg)  06/24/14 133 lb 9 oz (60.584 kg)      Other studies Reviewed: Additional studies/ records that were reviewed today include: prior echo, Leta Speller notes, labs, prior device check 2/16    ASSESSMENT AND PLAN:  1.  Mobitz II second degree AV block Normal pacemaker function See Pace Art report No changes today  2. Nausea, poor appetite, dyspepsia/ chest discomfort Most likely due to GERD I will start PPI today Unlikely cardiac in etiology  3. SOB Repeat echo Normal PPM function Could consider lexiscan, though given advanced age, I think that a more  conservative approach is best  4. HTN Stable No change required today  Current medicines are reviewed at length with the patient today.   The patient does not have concerns regarding her medicines.  The following changes were made today:  none  Labs/ tests ordered today include:  Orders Placed This Encounter  Procedures  . 2D Echocardiogram without contrast    Follow-up with Richardson Dopp in 6 weeks I will follow with remote monitoring and return to see me in the office in a year If echo is unrevealing then no further CV testing is planned She should follow-up with primary care for multiple somatic issues  I had a long discussion with patients family about her advanced age and what reasonable expectations should be.  I would advise a conservative palliative approach directed by primary care long term.  Army Fossa, MD  07/21/2014 1:50 PM     [address] [place] [address] [phone] (office) [phone] (fax)

## 2014-07-21 NOTE — Patient Instructions (Addendum)
Your physician has recommended you make the following change in your medication:   Start Protonix 40 mg daily by mouth on an empty stomach.   Remote monitoring is used to monitor your Pacemaker or ICD from home. This monitoring reduces the number of office visits required to check your device to one time per year. It allows [place] to keep an eye on the functioning of your device to ensure it is working properly. You are scheduled for a device check from home on 10/20/2014. You may send your transmission at any time that day. If you have a wireless device, the transmission will be sent automatically. After your physician reviews your transmission, you will receive a postcard with your next transmission date.  Your physician recommends that you schedule a follow-up appointment in:  6 weeks with Richardson Dopp PA.  Your physician has requested that you have an echocardiogram. Echocardiography is a painless test that uses sound waves to create images of your heart. It provides your doctor with information about the size and shape of your heart and how well your heart's chambers and valves are working. This procedure takes approximately one hour. There are no restrictions for this procedure.

## 2014-07-21 NOTE — Telephone Encounter (Signed)
pt's son notified of lab results due to language barrier.

## 2014-07-22 ENCOUNTER — Ambulatory Visit (INDEPENDENT_AMBULATORY_CARE_PROVIDER_SITE_OTHER): Payer: Medicare Other | Admitting: Family Medicine

## 2014-07-22 ENCOUNTER — Telehealth: Payer: Self-pay | Admitting: Cardiology

## 2014-07-22 ENCOUNTER — Encounter: Payer: Self-pay | Admitting: Family Medicine

## 2014-07-22 VITALS — BP 146/62 | HR 83 | Temp 98.1°F | Wt 132.1 lb

## 2014-07-22 DIAGNOSIS — E039 Hypothyroidism, unspecified: Secondary | ICD-10-CM

## 2014-07-22 DIAGNOSIS — I5032 Chronic diastolic (congestive) heart failure: Secondary | ICD-10-CM

## 2014-07-22 DIAGNOSIS — M4806 Spinal stenosis, lumbar region: Secondary | ICD-10-CM | POA: Diagnosis not present

## 2014-07-22 DIAGNOSIS — K219 Gastro-esophageal reflux disease without esophagitis: Secondary | ICD-10-CM | POA: Diagnosis not present

## 2014-07-22 DIAGNOSIS — I5041 Acute combined systolic (congestive) and diastolic (congestive) heart failure: Secondary | ICD-10-CM

## 2014-07-22 DIAGNOSIS — G44209 Tension-type headache, unspecified, not intractable: Secondary | ICD-10-CM

## 2014-07-22 DIAGNOSIS — M12811 Other specific arthropathies, not elsewhere classified, right shoulder: Secondary | ICD-10-CM

## 2014-07-22 DIAGNOSIS — M12511 Traumatic arthropathy, right shoulder: Secondary | ICD-10-CM | POA: Diagnosis not present

## 2014-07-22 DIAGNOSIS — F329 Major depressive disorder, single episode, unspecified: Secondary | ICD-10-CM

## 2014-07-22 DIAGNOSIS — M75101 Unspecified rotator cuff tear or rupture of right shoulder, not specified as traumatic: Secondary | ICD-10-CM

## 2014-07-22 DIAGNOSIS — I441 Atrioventricular block, second degree: Secondary | ICD-10-CM | POA: Diagnosis not present

## 2014-07-22 DIAGNOSIS — F32A Depression, unspecified: Secondary | ICD-10-CM

## 2014-07-22 DIAGNOSIS — G894 Chronic pain syndrome: Secondary | ICD-10-CM | POA: Diagnosis not present

## 2014-07-22 DIAGNOSIS — M25511 Pain in right shoulder: Secondary | ICD-10-CM | POA: Diagnosis not present

## 2014-07-22 DIAGNOSIS — G309 Alzheimer's disease, unspecified: Secondary | ICD-10-CM | POA: Diagnosis not present

## 2014-07-22 MED ORDER — POLYETHYLENE GLYCOL 3350 17 G PO PACK: 17.0000 g | PACK | Freq: Every day | ORAL | Status: DC

## 2014-07-22 MED ORDER — OXYCODONE-ACETAMINOPHEN 5-325 MG PO TABS: 1.0000 | ORAL_TABLET | Freq: Four times a day (QID) | ORAL | Status: DC | PRN

## 2014-07-22 NOTE — Patient Instructions (Signed)
Nice to see you. Please try the percocet every 6 hours.  You should take the miralax while taking percocet. We have referred you to Dr Einar Gip. Please call his office to set up an appointment.  If she develops shortness of breath, chest, weakness, numbness, or change in speech please seek medical attention.

## 2014-07-22 NOTE — Telephone Encounter (Signed)
LMOVM for pt to return call in regards to home monitor.

## 2014-07-22 NOTE — Telephone Encounter (Signed)
-----   Message from Ranee Gosselin, RN sent at 07/22/2014  7:46 AM EDT ----- Regarding: remote troubleshooting This patient came in yesterday- her daughter Stasia Cavalier said they have been sending transmissions and getting the green checkmark on screen. No transmission received- I asked her to send one yesterday afternoon. Would you please call [name] at [phone] and troubleshoot or give her tech support #? I told her that tech support may be the next option. Thank you!

## 2014-07-24 ENCOUNTER — Telehealth: Payer: Self-pay | Admitting: Internal Medicine

## 2014-07-24 ENCOUNTER — Telehealth: Payer: Self-pay | Admitting: *Deleted

## 2014-07-24 ENCOUNTER — Encounter: Payer: Self-pay | Admitting: *Deleted

## 2014-07-24 DIAGNOSIS — I5032 Chronic diastolic (congestive) heart failure: Secondary | ICD-10-CM | POA: Diagnosis not present

## 2014-07-24 DIAGNOSIS — G894 Chronic pain syndrome: Secondary | ICD-10-CM | POA: Diagnosis not present

## 2014-07-24 DIAGNOSIS — M4806 Spinal stenosis, lumbar region: Secondary | ICD-10-CM | POA: Diagnosis not present

## 2014-07-24 DIAGNOSIS — I441 Atrioventricular block, second degree: Secondary | ICD-10-CM | POA: Diagnosis not present

## 2014-07-24 DIAGNOSIS — G309 Alzheimer's disease, unspecified: Secondary | ICD-10-CM | POA: Diagnosis not present

## 2014-07-24 DIAGNOSIS — M25511 Pain in right shoulder: Secondary | ICD-10-CM | POA: Diagnosis not present

## 2014-07-24 MED ORDER — OMEPRAZOLE 20 MG PO CPDR: 20.0000 mg | DELAYED_RELEASE_CAPSULE | Freq: Every day | ORAL | Status: DC

## 2014-07-24 NOTE — Telephone Encounter (Signed)
Sent in Omeprazole 20 mg daily.  Daughter aware

## 2014-07-24 NOTE — Telephone Encounter (Signed)
This encounter was created in error - please disregard.

## 2014-07-24 NOTE — Telephone Encounter (Signed)
New Msg      Pt c/o medication issue:  1. Name of Medication: Protonix  2. How are you currently taking this medication (dosage and times per day)? Pt daughter not sure  3. Are you having a reaction (difficulty breathing--STAT)? No  4. What is your medication issue? Protonix not covered by insurance, is there an alternative?   Please contact pt daughter in regards to this.

## 2014-07-24 NOTE — Telephone Encounter (Signed)
Received fax request for prior authorization from pharmacy for [place] for Lidocaine patch.  Prior authorization form placed in Dr. Ellen Henri box for completion.  Burna Forts, BSN, RN-BC

## 2014-07-25 ENCOUNTER — Other Ambulatory Visit: Payer: Self-pay | Admitting: Family Medicine

## 2014-07-25 DIAGNOSIS — K219 Gastro-esophageal reflux disease without esophagitis: Secondary | ICD-10-CM | POA: Insufficient documentation

## 2014-07-25 NOTE — Telephone Encounter (Signed)
Follow Up        Pt's daughter returning Barbara's phone call.

## 2014-07-25 NOTE — Assessment & Plan Note (Addendum)
Patient with chronic diastolic HF and chronic dyspnea. No dyspnea on evaluation today. Saw cardiology the day before and the plan is to proceed with an echo to further evaluate the structure of her heart. She has normal WOB today and no apparent LE edema. Bibasilar crackles possibly related to atelectasis vs residual scarring seen on most recent CXR. Will continue to monitor. Given return precautions.   Patient was precepted with Dr Andria Frames.

## 2014-07-25 NOTE — Assessment & Plan Note (Signed)
Patient with continued pain related to this issue. Will increase percocet to q6 hrs prn. Will attempt to get PA for lidoderm patches as these have been the only thing that has been greatly beneficial in the past. Will start on miralax for prevention of constipation given narcotic regimen.

## 2014-07-25 NOTE — Telephone Encounter (Signed)
Spoke w/ pt daughter and informed her that we have not received any transmission. Pt daughter was not at home or near home monitor. I instructed pt daughter to call tech service number to receive help trouble shooting monitor.

## 2014-07-25 NOTE — Assessment & Plan Note (Signed)
Depression appears to be mostly related to her level of pain at this time. She is currently on cymbalta to help with pain and depression. Will continue this at this time and attempt to treat her pain first to see if this will improve her mood. Has SI, though no intent or plan to hurt herself. Given return precautions.

## 2014-07-25 NOTE — Assessment & Plan Note (Addendum)
Most likely tension headache related to discomfort at other body sites. Unlikely to be SAH given gradual onset and free ROM of neck. Previous CT head in 2014 with no masses seen. She is neurologically intact at this time and has free ROM of her neck with no discomfort. Will again attempt to treat her discomfort at other sites in her body in hopes that this will relieve the tension leading to her headaches. Given return precautions.

## 2014-07-25 NOTE — Progress Notes (Signed)
Patient ID: Jordan [name], female   DOB: Jan 03, 1920, 81 y.o.   MRN: [id number]  Jordan Rumps, MD Phone: [phone]  Jordan [name] is a 81 y.o. female who presents today for f/u.  Patient presents with a variety of complaints and is accompanied by her daughter who serves as Optometrist at the patients request.   They note the patient just saw Dr Rayann Heman at cardiology for follow-up. She has been having some heavy breathing recently even with sitting and moving around. It is not worsened by activity. Notes some dyspnea on laying down. No chest pain. No fevers. They note that cardiology wants to repeat an echo to evaluate this dyspnea better. No dyspnea at this time. They state cardiology did not recommend any more lasix, and per there note there is no mention of lasix.   She additionally notes headache. States this is a heaviness in the bilateral temples and in the back of her head. Gradual onset. No weakness, numbness, or vision changes. Notes having had HAs like this previously prior to having pacemaker placed. Denies neck pain. Does note continued right shoulder pain that has been keeping her from getting comfortable.   She states the cardiologist started her on a PPI to see if this would help with nausea and poor appetite. Patient denies abdominal pain and reflux symptoms.  Also notes that she feels depressed. This has been going on for some time. She notes having passive thoughts of being better off dead, though states she has no intent or plan to harm herself. These thoughts of depression are mainly related to her pain and being in pain most of the time.  PHQ9 20  PMH: nonsmoker.   ROS: Per HPI   Physical Exam Filed Vitals:   07/22/14 1643  BP: 146/62  Pulse: 83  Temp: 98.1 F (36.7 C)    Gen: NAD, elderly, in wheel chair HEENT: PERRL,  MMM Lungs: bibasilar crackles noted Nl WOB Heart: RRR  Abd: soft, NT, ND Neuro: vision grossly intact, CN 3-12 intact, 5/5 strength in bilateral  biceps, triceps, grip, quads, hamstrings, plantar and dorsiflexion, sensation to light touch intact in bilateral UE and LE, 2+ patellar reflexes MSK: unable to move right shoulder much relating to pain, free ROM of her neck with no discomfort Exts: Non edematous BL  LE, warm and well perfused.    Assessment/Plan: Please see individual problem list.  Jordan Rumps, MD [place] PGY-3

## 2014-07-25 NOTE — Assessment & Plan Note (Signed)
Recheck TSH today.  

## 2014-07-25 NOTE — Assessment & Plan Note (Signed)
Nausea and decreased appetite potentially related to GERD. No abdominal exam abnormalities. Agree with trial of PPI in this patient. If no improvement may need to consider CMET vs adding back remeron for appetite stimulation. Given return precautions.

## 2014-07-26 ENCOUNTER — Other Ambulatory Visit: Payer: Self-pay | Admitting: Emergency Medicine

## 2014-07-26 ENCOUNTER — Telehealth: Payer: Self-pay | Admitting: Family Medicine

## 2014-07-26 ENCOUNTER — Ambulatory Visit (INDEPENDENT_AMBULATORY_CARE_PROVIDER_SITE_OTHER): Payer: Medicare Other

## 2014-07-26 ENCOUNTER — Ambulatory Visit (INDEPENDENT_AMBULATORY_CARE_PROVIDER_SITE_OTHER): Payer: Medicare Other | Admitting: Emergency Medicine

## 2014-07-26 VITALS — BP 128/68 | HR 82 | Temp 98.1°F | Resp 20 | Ht <= 58 in | Wt 131.4 lb

## 2014-07-26 DIAGNOSIS — M25532 Pain in left wrist: Secondary | ICD-10-CM

## 2014-07-26 DIAGNOSIS — M79642 Pain in left hand: Secondary | ICD-10-CM

## 2014-07-26 DIAGNOSIS — S62102A Fracture of unspecified carpal bone, left wrist, initial encounter for closed fracture: Secondary | ICD-10-CM | POA: Diagnosis not present

## 2014-07-26 NOTE — Progress Notes (Signed)
   Subjective:    Patient ID: Jordan [name], female    DOB: 1919-10-09, 81 y.o.   MRN: [id number]  This chart was scribed for Darlyne Russian, MD by Stephania Fragmin, ED Scribe. This patient was seen in room 11 and the patient's care was started at 3:23 PM.   HPI  The history is provided by patient's daughter.   HPI Comments: Jordan [name] is a 81 y.o. female who presents to the [facility] complaining of left wrist pain S/P a fall that occurred last night at 12 AM. Per daughter, patient was walking with her walker last night and missed when she tried to sit in a chair, landing on her left side. The paramedics had evaluated her last night, but told patient's family that the arm did not appear broken. However, patient's daughter says that her arm is more swollen and red today than it was last night. Patient's daughter also notes that she is unable to use her right arm due to arthritis and tendonitis. Patient normally takes Percocet every 6-8 hours; her last dose was at 8 PM yesterday. Patient denies any complaints on the rest of her body.   Patient was last seen by Dr. Caryl Bis 4 days ago for SOB. Patient's daughter also reports that patient had SOB last night, as well as a fluctuating blood pressure. Patient is no longer taking hypertension medication but is still taking Lasix.   Review of Systems  Respiratory: Positive for shortness of breath.   Musculoskeletal: Positive for myalgias and arthralgias.      Objective:   Physical Exam  Nursing note and vitals reviewed.  CONSTITUTIONAL: Well developed/well nourished HEAD: Normocephalic/atraumatic EYES: EOMI/PERRL ENMT: Mucous membranes moist NECK: supple no meningeal signs SPINE/BACK:entire spine nontender CV: S1/S2 noted, no murmurs/rubs/gallops noted LUNGS: Lungs are clear to auscultation bilaterally, no apparent distress ABDOMEN: soft, nontender, no rebound or guarding, bowel sounds noted throughout abdomen GU:no cva  tenderness NEURO: Pt is awake/alert/appropriate, moves all extremitiesx4.  No facial droop.   EXTREMITIES: Tenderness and swelling over the distal left wrist. She is able to flex and extend the fingers. There is significant tenderness over the distal radius. SKIN: warm, color normal PSYCH: no abnormalities of mood noted, alert and oriented to situation  [facility] reading (PRIMARY) by  Dr.Akesha Uresti there is a fracture of the distal radius which extends to the radial ulnar joint. No definite carpal fracture was seen      Assessment & Plan:  Patient will be placed in a splint and sling. She will follow-up with Dr. Theda Sers next week.I personally performed the services described in this documentation, which was scribed in my presence. The recorded information has been reviewed and is accurate.

## 2014-07-26 NOTE — Patient Instructions (Signed)
Wrist Fracture A wrist fracture is a break or crack in one of the bones of your wrist. Your wrist is made up of eight small bones at the palm of your hand (carpal bones) and two long bones that make up your forearm (radius and ulna).  CAUSES   A direct blow to the wrist.  Falling on an outstretched hand.  Trauma, such as a car accident or a fall. RISK FACTORS Risk factors for wrist fracture include:   Participating in contact and high-risk sports, such as skiing, biking, and ice skating.  Taking steroid medicines.  Smoking.  Being female.  Being Caucasian.  Drinking more than three alcoholic beverages per day.  Having low or lowered bone density (osteoporosis or osteopenia).  Age. Older adults have decreased bone density.  Women who have had menopause.  History of previous fractures. SIGNS AND SYMPTOMS Symptoms of wrist fractures include tenderness, bruising, and inflammation. Additionally, the wrist may hang in an odd position or appear deformed.  DIAGNOSIS Diagnosis may include:  Physical exam.  X-ray. TREATMENT Treatment depends on many factors, including the nature and location of the fracture, your age, and your activity level. Treatment for wrist fracture can be nonsurgical or surgical.  Nonsurgical Treatment A plaster cast or splint may be applied to your wrist if the bone is in a good position. If the fracture is not in good position, it may be necessary for your health care provider to realign it before applying a splint or cast. Usually, a cast or splint will be worn for several weeks.  Surgical Treatment Sometimes the position of the bone is so far out of place that surgery is required to apply a device to hold it together as it heals. Depending on the fracture, there are a number of options for holding the bone in place while it heals, such as a cast and metal pins.  HOME CARE INSTRUCTIONS  Keep your injured wrist elevated and move your fingers as much as  possible.  Do not put pressure on any part of your cast or splint. It may break.   Use a plastic bag to protect your cast or splint from water while bathing or showering. Do not lower your cast or splint into water.  Take medicines only as directed by your health care provider.  Keep your cast or splint clean and dry. If it becomes wet, damaged, or suddenly feels too tight, contact your health care provider right away.  Do not use any tobacco products including cigarettes, chewing tobacco, or electronic cigarettes. Tobacco can delay bone healing. If you need help quitting, ask your health care provider.  Keep all follow-up visits as directed by your health care provider. This is important.  Ask your health care provider if you should take supplements of calcium and vitamins C and D to promote bone healing. SEEK MEDICAL CARE IF:   Your cast or splint is damaged, breaks, or gets wet.  You have a fever.  You have chills.  You have continued severe pain or more swelling than you did before the cast was put on. SEEK IMMEDIATE MEDICAL CARE IF:   Your hand or fingernails on the injured arm turn blue or gray, or feel cold or numb.  You have decreased feeling in the fingers of your injured arm. MAKE SURE YOU:  Understand these instructions.  Will watch your condition.  Will get help right away if you are not doing well or get worse. Document Released: 01/26/2005 Document Revised:   09/02/2013 Document Reviewed: 05/06/2011 ExitCare Patient Information 2015 [employer]. This information is not intended to replace advice given to you by your health care provider. Make sure you discuss any questions you have with your health care provider.  

## 2014-07-26 NOTE — Telephone Encounter (Signed)
Daughter calling to report patient had a fall last night. No head injury. EMS responded but patient unwilling to go to hospital at that time. Now having pain and swelling in hand and blood pressure 80-100/40s-50s. Patient still refusing to come to ED. Recommended she be seen at urgent care for xrays and eval of blood pressure vs. other reasons for fall. Daughter voiced understanding and agreement.

## 2014-07-28 ENCOUNTER — Ambulatory Visit: Payer: Medicare Other | Admitting: Family Medicine

## 2014-07-28 ENCOUNTER — Telehealth: Payer: Self-pay | Admitting: Internal Medicine

## 2014-07-28 ENCOUNTER — Telehealth: Payer: Self-pay | Admitting: Family Medicine

## 2014-07-28 DIAGNOSIS — M25532 Pain in left wrist: Secondary | ICD-10-CM | POA: Diagnosis not present

## 2014-07-28 DIAGNOSIS — S52552A Other extraarticular fracture of lower end of left radius, initial encounter for closed fracture: Secondary | ICD-10-CM | POA: Diagnosis not present

## 2014-07-28 NOTE — Telephone Encounter (Signed)
Will forward to PCP for advice. Would you like for pt to follow up with you or Dr. McDiarmid for this? Please advise. Nataleigh Griffin, CMA.

## 2014-07-28 NOTE — Telephone Encounter (Signed)
Pt fell and fractured her left wrist. She is already having problems with her right arm Oxygen level is runnng fro 88 to 92. Was seen by Dr Jerrell Mylar?  After fall Dr suggested she be seen to check oxpgent levels Please advise

## 2014-07-28 NOTE — Telephone Encounter (Signed)
Pt is scheduled to be seen tomorrow on same day with Dr. Wendi Snipes. [name],CMA

## 2014-07-28 NOTE — Telephone Encounter (Signed)
Patient can follow-up with anyone for this. If there is a concern regarding her oxygen she should be seen today. Please inform her daughter. Thanks.

## 2014-07-28 NOTE — Telephone Encounter (Signed)
Spoke w/ pt daughter and informed that I will call tech services to update pt profile w/ new monitor serial number. Spoke w/ tech service and had serial number updated in pt carelink profile. Spoke w/ pt daughter and instructed her to send manual transmission. Transmission received.

## 2014-07-28 NOTE — Telephone Encounter (Signed)
New message     Talk to someone in the device clinic.  Daughter says her mother's pacemaker has not been registered therefore, she cannot do a transmission.  Please call

## 2014-07-29 ENCOUNTER — Ambulatory Visit (HOSPITAL_COMMUNITY): Payer: Medicare Other | Attending: Cardiology

## 2014-07-29 ENCOUNTER — Encounter: Payer: Self-pay | Admitting: Family Medicine

## 2014-07-29 ENCOUNTER — Ambulatory Visit
Admission: RE | Admit: 2014-07-29 | Discharge: 2014-07-29 | Disposition: A | Discharge status: Home / Self Care | Payer: Medicare Other | Source: Ambulatory Visit | Attending: Family Medicine | Admitting: Family Medicine

## 2014-07-29 ENCOUNTER — Ambulatory Visit (INDEPENDENT_AMBULATORY_CARE_PROVIDER_SITE_OTHER): Payer: Medicare Other | Admitting: Family Medicine

## 2014-07-29 VITALS — BP 146/71 | HR 87 | Temp 98.0°F | Ht <= 58 in | Wt 132.1 lb

## 2014-07-29 DIAGNOSIS — R0602 Shortness of breath: Secondary | ICD-10-CM

## 2014-07-29 DIAGNOSIS — R0600 Dyspnea, unspecified: Secondary | ICD-10-CM | POA: Diagnosis not present

## 2014-07-29 DIAGNOSIS — I10 Essential (primary) hypertension: Secondary | ICD-10-CM | POA: Diagnosis not present

## 2014-07-29 DIAGNOSIS — R7981 Abnormal blood-gas level: Secondary | ICD-10-CM | POA: Diagnosis not present

## 2014-07-29 DIAGNOSIS — E039 Hypothyroidism, unspecified: Secondary | ICD-10-CM

## 2014-07-29 DIAGNOSIS — R079 Chest pain, unspecified: Secondary | ICD-10-CM

## 2014-07-29 DIAGNOSIS — R05 Cough: Secondary | ICD-10-CM | POA: Diagnosis not present

## 2014-07-29 LAB — CBC WITH DIFFERENTIAL/PLATELET
BASOS ABS: 0 10*3/uL (ref 0.0–0.1)
BASOS PCT: 0 % (ref 0–1)
EOS ABS: 0.1 10*3/uL (ref 0.0–0.7)
EOS PCT: 2 % (ref 0–5)
HCT: 34.3 % — ABNORMAL LOW (ref 36.0–46.0)
Hemoglobin: 11.5 g/dL — ABNORMAL LOW (ref 12.0–15.0)
LYMPHS PCT: 21 % (ref 12–46)
Lymphs Abs: 1.5 10*3/uL (ref 0.7–4.0)
MCH: 32.8 pg (ref 26.0–34.0)
MCHC: 33.5 g/dL (ref 30.0–36.0)
MCV: 97.7 fL (ref 78.0–100.0)
MONO ABS: 0.5 10*3/uL (ref 0.1–1.0)
MONOS PCT: 7 % (ref 3–12)
MPV: 10.5 fL (ref 8.6–12.4)
NEUTROS ABS: 4.9 10*3/uL (ref 1.7–7.7)
Neutrophils Relative %: 70 % (ref 43–77)
PLATELETS: 235 10*3/uL (ref 150–400)
RBC: 3.51 MIL/uL — ABNORMAL LOW (ref 3.87–5.11)
RDW: 13.3 % (ref 11.5–15.5)
WBC: 7 10*3/uL (ref 4.0–10.5)

## 2014-07-29 LAB — TSH: TSH: 0.633 u[IU]/mL (ref 0.350–4.500)

## 2014-07-29 LAB — BASIC METABOLIC PANEL
BUN: 32 mg/dL — ABNORMAL HIGH (ref 6–23)
CO2: 29 mEq/L (ref 19–32)
Calcium: 8.7 mg/dL (ref 8.4–10.5)
Chloride: 101 mEq/L (ref 96–112)
Creat: 1.06 mg/dL (ref 0.50–1.10)
Glucose, Bld: 113 mg/dL — ABNORMAL HIGH (ref 70–99)
Potassium: 3.9 mEq/L (ref 3.5–5.3)
SODIUM: 138 meq/L (ref 135–145)

## 2014-07-29 NOTE — Progress Notes (Signed)
2D Echo completed. 07/29/2014

## 2014-07-29 NOTE — Progress Notes (Signed)
Patient ID: Jordan [name], female   DOB: 01-May-1920, 81 y.o.   MRN: [id number]   HPI  Patient presents today for low oxygen saturation saturations  She explains that Friday night she fell down and broke her wrist. When the ambulance came they decided not to go to the hospital but her oxygen saturations were 88-92%. She's had a chronic dry cough over the last 6-8 weeks. She's had a mild increase in orthopnea. She denies dyspnea or worsening cough today.  She and her family deny fever, chills, change in oral intake, or change in activity level. When asked about malaise they state is very typical to tell is also difficult for her to tell due to her general and activity lately.  She was using oxycodone and they've tried to cut this back as they feel that it may contribute to her fall which led to wrist fracture.  She describes 1-2 second episodes of chest tightness/funny feeling which has been going on for several weeks now. It happens at rest and with activity and is not reproducible with activity. Denies dyspnea, sweats, or overt pain with this sensation  She denies chest pain  Smoking status noted ROS: Per HPI  Objective: BP 146/71 mmHg  Pulse 87  Temp(Src) 98 F (36.7 C) (Oral)  Ht 4\' 9"  (1.448 m)  Wt 132 lb 1.6 oz (59.92 kg)  BMI 28.58 kg/m2 Gen: NAD, alert, cooperative with exam HEENT: NCAT CV: RRR, good S1/S2, soft murmur Resp: Nonlabored, few soft crackles in the lower right base Ext: No edema, warm Neuro: Alert and oriented, No gross deficits  Assessment and plan:  Low oxygen saturation Unclear etiology Most likely sources are volume overload, developing pneumonia, or progressing underlying lung disease Also consider possibility of normal low oxygen saturations All these were discussed with family Considering increased orthopnea late will increase Lasix to 40 mg for the next 3 days, still I believe this is unlikely to be a serious problem given her stable weight and almost  completely clear lung exam today Chest x-ray today, also with chronic cough now PFTs in pharmacy clinic for evaluation of underlying lung disease and chronic cough, previous chest x-ray showed stigmata of emphysema Ambulating oxygen saturations ranged from 91-94% Of note she also has lung mass on her problem list, on review of her chart she had stable lung scarring which was seen as a 2 cm mass on MRI in December 2011 and then noted as scarring on a CT chest a couple weeks later.     Orders Placed This Encounter  Procedures  . DG Chest 2 View    Standing Status: Future     Number of Occurrences:      Standing Expiration Date: 09/28/2015    Order Specific Question:  Reason for Exam (SYMPTOM  OR DIAGNOSIS REQUIRED)    Answer:  cough, low sats    Order Specific Question:  Preferred imaging location?    Answer:  [facility]  . Basic Metabolic Panel  . CBC with Differential

## 2014-07-29 NOTE — Assessment & Plan Note (Addendum)
Unclear etiology Most likely sources are volume overload, developing pneumonia, or progressing underlying lung disease Also consider possibility of normal low oxygen saturations All these were discussed with family Considering increased orthopnea late will increase Lasix to 40 mg for the next 3 days, still I believe this is unlikely to be a serious problem given her stable weight and almost completely clear lung exam today Chest x-ray today, also with chronic cough now PFTs in pharmacy clinic for evaluation of underlying lung disease and chronic cough, previous chest x-ray showed stigmata of emphysema Ambulating oxygen saturations ranged from 91-94% Of note she also has lung mass on her problem list, on review of her chart she had stable lung scarring which was seen as a 2 cm mass on MRI in December 2011 and then noted as scarring on a CT chest a couple weeks later.

## 2014-07-29 NOTE — Patient Instructions (Signed)
Great to meet you guys!  Its difficult to figure out exactly why her oxygen saturations are a little low. It could be heart failure, pneumonia, or an underlying lung disease.   Double her lasix for 3 days  Get the chest x ray (this will look for infections)  With her cough for 2 months now she should have spirometry (breathing evaluation) done anyway.   I will send a letter with her lab results in 1 week.   Please come back if anything worsens

## 2014-07-30 ENCOUNTER — Encounter: Payer: Self-pay | Admitting: Family Medicine

## 2014-07-30 ENCOUNTER — Telehealth: Payer: Self-pay | Admitting: Family Medicine

## 2014-07-30 ENCOUNTER — Other Ambulatory Visit: Payer: Self-pay | Admitting: Family Medicine

## 2014-07-30 NOTE — Telephone Encounter (Signed)
Called left VM that I will send letter. PLease call back if wants to hear sooner.   Laroy Apple, MD [place] Resident, PGY-3 07/30/2014, 8:23 AM  '

## 2014-07-31 ENCOUNTER — Telehealth: Payer: Self-pay | Admitting: Family Medicine

## 2014-07-31 ENCOUNTER — Telehealth: Payer: Self-pay | Admitting: *Deleted

## 2014-07-31 DIAGNOSIS — M25511 Pain in right shoulder: Secondary | ICD-10-CM | POA: Diagnosis not present

## 2014-07-31 DIAGNOSIS — G894 Chronic pain syndrome: Secondary | ICD-10-CM | POA: Diagnosis not present

## 2014-07-31 DIAGNOSIS — I441 Atrioventricular block, second degree: Secondary | ICD-10-CM | POA: Diagnosis not present

## 2014-07-31 DIAGNOSIS — G309 Alzheimer's disease, unspecified: Secondary | ICD-10-CM | POA: Diagnosis not present

## 2014-07-31 DIAGNOSIS — M4806 Spinal stenosis, lumbar region: Secondary | ICD-10-CM | POA: Diagnosis not present

## 2014-07-31 DIAGNOSIS — I5032 Chronic diastolic (congestive) heart failure: Secondary | ICD-10-CM | POA: Diagnosis not present

## 2014-07-31 NOTE — Telephone Encounter (Signed)
[facility] PT is requesting a verbal order to have PT on shoulder as well as leg.   She is requesting 2 X week for 2 weeks. Please give Akiko a call back with verbal order. Liara Holm, [name]

## 2014-07-31 NOTE — Telephone Encounter (Signed)
You can give a verbal order for PT on shoulder as well. Thanks.

## 2014-07-31 NOTE — Telephone Encounter (Signed)
[name] called and would like to speak to [name] concerning her mother. Jordan [name]

## 2014-07-31 NOTE — Telephone Encounter (Signed)
Spoke with daughter and informed her of appt with Dr. Einar Gip on 09/04/2014 at 1pm.  Daughter also wanted to make provider that [facility] hasn't received their form on why she needs more assistance.  Informed daughter that this was faxed over this morning.  Voiced understanding and will come by later to pick up a copy of this.  We will also be receiving a form from [place] to fill out.  This will allow medicaid to possibly allot patient some money to pay for care.  Also it needs to be detailed and describe her many needs with her ADLs.  Pt was also told at her last ophthalmology appt that she has decreased vision from her glaucoma and there is nothing they can do about it.  Advised daughter that I would let pcp know all of this information. Dayle Mcnerney,CMA

## 2014-08-01 ENCOUNTER — Telehealth: Payer: Self-pay | Admitting: Family Medicine

## 2014-08-01 NOTE — Telephone Encounter (Signed)
Verbal orders given to [facility] for PT on patient's leg.  She is unable to do any therapy on her shoulder anymore. [name],CMA

## 2014-08-01 NOTE — Telephone Encounter (Signed)
Noted  

## 2014-08-01 NOTE — Telephone Encounter (Signed)
Jordan [name] called and would like to speak to Jordan [name] about the pt (her mother) / thanks [employer], ASA

## 2014-08-01 NOTE — Telephone Encounter (Signed)
Daughter would like results of xray that Dr. Wendi Snipes ordered.  Also she wanted a copy of form faxed for pcs services place up front.  (already put up front and she is aware).  [name],CMA

## 2014-08-05 ENCOUNTER — Telehealth: Payer: Self-pay | Admitting: Family Medicine

## 2014-08-05 DIAGNOSIS — M25511 Pain in right shoulder: Secondary | ICD-10-CM | POA: Diagnosis not present

## 2014-08-05 DIAGNOSIS — G309 Alzheimer's disease, unspecified: Secondary | ICD-10-CM | POA: Diagnosis not present

## 2014-08-05 DIAGNOSIS — G894 Chronic pain syndrome: Secondary | ICD-10-CM | POA: Diagnosis not present

## 2014-08-05 DIAGNOSIS — I441 Atrioventricular block, second degree: Secondary | ICD-10-CM | POA: Diagnosis not present

## 2014-08-05 DIAGNOSIS — I5032 Chronic diastolic (congestive) heart failure: Secondary | ICD-10-CM | POA: Diagnosis not present

## 2014-08-05 DIAGNOSIS — M4806 Spinal stenosis, lumbar region: Secondary | ICD-10-CM | POA: Diagnosis not present

## 2014-08-05 NOTE — Telephone Encounter (Signed)
[facility] nurse called and wanted to know what the results from the x-rays were. They also said that the patient is not sleeping and her family would like her to get back on Flexeril even if it is a half of a dose. Home health nurse also said that her lungs still had a bronchi in the mid and lower sections. Please call patient and [facility] with the results. jw

## 2014-08-05 NOTE — Telephone Encounter (Signed)
Will forward to PCP for advice. Tamarra Geiselman, CMA. 

## 2014-08-05 NOTE — Telephone Encounter (Signed)
PA filled out and returned to [employer].

## 2014-08-05 NOTE — Telephone Encounter (Signed)
Received PA denial for Lidocaine via [employer].   [facility] pharmacy informed.  Denial placed in provider box to review and decision to appeal or not.   Derl Barrow, RN

## 2014-08-05 NOTE — Telephone Encounter (Signed)
PA faxed to [place] for review.  Derl Barrow, RN

## 2014-08-06 DIAGNOSIS — H4011X3 Primary open-angle glaucoma, severe stage: Secondary | ICD-10-CM | POA: Diagnosis not present

## 2014-08-06 NOTE — Telephone Encounter (Signed)
Noted. Will not appeal at this time as PA has been refused multiple times in the past.

## 2014-08-06 NOTE — Telephone Encounter (Signed)
Patients CXR did not reveal any signs of infection or heart failure volume overload. I do not think it would be a good option to go back on flexeril as this can lead to changes in levels of consciousness in elderly and can contribute to falls. This would not be a recommended medication in someone her age, especially in light of her recent fall. Will need to further discuss her pain regimen at follow-up. Please inform the patient and [facility] of this.

## 2014-08-06 NOTE — Telephone Encounter (Signed)
Spoke with daughter and she is aware of results.  She also noticed that patient has gone up 1.4lbs in the last day.  Patient's appetite hasn't increased any either.  They also have an appt tomorrow with pain management. Leila Schuff,CMA

## 2014-08-07 DIAGNOSIS — M1990 Unspecified osteoarthritis, unspecified site: Secondary | ICD-10-CM | POA: Diagnosis not present

## 2014-08-07 DIAGNOSIS — M75121 Complete rotator cuff tear or rupture of right shoulder, not specified as traumatic: Secondary | ICD-10-CM | POA: Diagnosis not present

## 2014-08-07 DIAGNOSIS — M60832 Other myositis, left forearm: Secondary | ICD-10-CM | POA: Diagnosis not present

## 2014-08-07 DIAGNOSIS — G894 Chronic pain syndrome: Secondary | ICD-10-CM | POA: Diagnosis not present

## 2014-08-07 DIAGNOSIS — M25511 Pain in right shoulder: Secondary | ICD-10-CM | POA: Diagnosis not present

## 2014-08-07 DIAGNOSIS — M25532 Pain in left wrist: Secondary | ICD-10-CM | POA: Diagnosis not present

## 2014-08-08 ENCOUNTER — Telehealth: Payer: Self-pay | Admitting: Internal Medicine

## 2014-08-08 DIAGNOSIS — G309 Alzheimer's disease, unspecified: Secondary | ICD-10-CM | POA: Diagnosis not present

## 2014-08-08 DIAGNOSIS — I5032 Chronic diastolic (congestive) heart failure: Secondary | ICD-10-CM | POA: Diagnosis not present

## 2014-08-08 DIAGNOSIS — M4806 Spinal stenosis, lumbar region: Secondary | ICD-10-CM | POA: Diagnosis not present

## 2014-08-08 DIAGNOSIS — M25511 Pain in right shoulder: Secondary | ICD-10-CM | POA: Diagnosis not present

## 2014-08-08 DIAGNOSIS — I441 Atrioventricular block, second degree: Secondary | ICD-10-CM | POA: Diagnosis not present

## 2014-08-08 DIAGNOSIS — G894 Chronic pain syndrome: Secondary | ICD-10-CM | POA: Diagnosis not present

## 2014-08-08 NOTE — Telephone Encounter (Signed)
New message      Pt saw pain management dr yesterday---will it be ok to take vivlodex.  She will start out with 5mg  if that does not work, she can take 10mg . Also, pt needs echo results

## 2014-08-08 NOTE — Telephone Encounter (Signed)
Probably reasonable to take for an initial short duration under close supervision of her pain specialist.  Given her advanced age, she is at risk with most medicines.

## 2014-08-08 NOTE — Telephone Encounter (Signed)
Spoke with her daughter who wants the okay from Dr Rayann Heman to take the medication.  Gave her the results of the echo and let her know I would forward to Dr Rayann Heman for review

## 2014-08-11 DIAGNOSIS — M25532 Pain in left wrist: Secondary | ICD-10-CM | POA: Diagnosis not present

## 2014-08-11 DIAGNOSIS — S52552D Other extraarticular fracture of lower end of left radius, subsequent encounter for closed fracture with routine healing: Secondary | ICD-10-CM | POA: Diagnosis not present

## 2014-08-11 NOTE — Telephone Encounter (Signed)
Daughter aware of Dr Jackalyn Lombard recommendations regarding medication.  She will start her on the medication today

## 2014-08-12 DIAGNOSIS — M4806 Spinal stenosis, lumbar region: Secondary | ICD-10-CM | POA: Diagnosis not present

## 2014-08-12 DIAGNOSIS — M25511 Pain in right shoulder: Secondary | ICD-10-CM | POA: Diagnosis not present

## 2014-08-12 DIAGNOSIS — I5032 Chronic diastolic (congestive) heart failure: Secondary | ICD-10-CM | POA: Diagnosis not present

## 2014-08-12 DIAGNOSIS — G309 Alzheimer's disease, unspecified: Secondary | ICD-10-CM | POA: Diagnosis not present

## 2014-08-12 DIAGNOSIS — G894 Chronic pain syndrome: Secondary | ICD-10-CM | POA: Diagnosis not present

## 2014-08-12 DIAGNOSIS — I441 Atrioventricular block, second degree: Secondary | ICD-10-CM | POA: Diagnosis not present

## 2014-08-13 ENCOUNTER — Encounter: Payer: Self-pay | Admitting: Family Medicine

## 2014-08-13 DIAGNOSIS — I441 Atrioventricular block, second degree: Secondary | ICD-10-CM | POA: Diagnosis not present

## 2014-08-13 DIAGNOSIS — G309 Alzheimer's disease, unspecified: Secondary | ICD-10-CM | POA: Diagnosis not present

## 2014-08-13 DIAGNOSIS — M4806 Spinal stenosis, lumbar region: Secondary | ICD-10-CM | POA: Diagnosis not present

## 2014-08-13 DIAGNOSIS — M25511 Pain in right shoulder: Secondary | ICD-10-CM | POA: Diagnosis not present

## 2014-08-13 DIAGNOSIS — I5032 Chronic diastolic (congestive) heart failure: Secondary | ICD-10-CM | POA: Diagnosis not present

## 2014-08-13 DIAGNOSIS — G894 Chronic pain syndrome: Secondary | ICD-10-CM | POA: Diagnosis not present

## 2014-08-13 NOTE — Progress Notes (Unsigned)
Daughter Jordan [name] called today and would like to know if we have received the "mailed form" from social security.  Ms. Jordan [name] states that it was mailed last week and that it needs to be filled out and brought back to her by patient's daughter.  Also Jordan [name] wants to know if you received her faxed letter yesterday from her personally.  She would like a call when these forms are ready for her to pick up. Jordan [name],CMA

## 2014-08-13 NOTE — Progress Notes (Signed)
FL2 form completed and faxed to Tyler Deis with DSS and faxed to pts daughter as requested. CSW also mailed original FL2 to [facility] and left a copy for daughter to pick up. Daughter made aware of the above & appreciative of assistance.  Hunt Oris, MSW, [place]

## 2014-08-13 NOTE — Progress Notes (Unsigned)
Form given to Hunt Oris to complete. I answered all questions she had for the form. Appreciate her help in completing this document.

## 2014-08-14 ENCOUNTER — Encounter: Payer: Self-pay | Admitting: Pharmacist

## 2014-08-14 ENCOUNTER — Ambulatory Visit (INDEPENDENT_AMBULATORY_CARE_PROVIDER_SITE_OTHER): Payer: Medicare Other | Admitting: Pharmacist

## 2014-08-14 VITALS — Wt 131.4 lb

## 2014-08-14 DIAGNOSIS — R0602 Shortness of breath: Secondary | ICD-10-CM | POA: Diagnosis present

## 2014-08-14 NOTE — Patient Instructions (Signed)
It was great to see you today.  The spirometry/lung test was all normal today!

## 2014-08-14 NOTE — Progress Notes (Signed)
S:    Patient arrives in good spirits accompanied by her daughter.    Presents for lung function evaluation following recent multi-day episode of shortness of breath.  Daughter reports her breathing has been improved for the last 2-3 days.  Patient and daughter deny  use of any inhaler medications in the last 24 hours.   O: mMRC score= >2 (possibly due to deconditioning with age) See "scanned report" or Documentation Flowsheet (discrete results - PFTs) for  Spirometry results. Patient provided good effort while attempting spirometry.   A/P: Spirometry evaluation without bronchodilator reveals normal lung function.  Patient has been experiencing dyspnea for several weeks recently improved for the last several days.  No rescue medication needed for the last 2-3 days.  no change in the treatment plan at this time.  Reviewed results of pulmonary function tests.  Pt and daughter verbalized understanding of results and education.  Written pt instructions provided.  F/U Clinic visit PRN.   Total time in face to face counseling 35 minutes.  Patient seen with Eunice Blase, PharmD Candidate.  Marland Kitchen

## 2014-08-14 NOTE — Assessment & Plan Note (Signed)
Spirometry evaluation without bronchodilator reveals normal lung function.  Patient has been experiencing dyspnea for several weeks recently improved for the last several days.  No rescue medication needed for the last 2-3 days.  no change in the treatment plan at this time.  Reviewed results of pulmonary function tests.  Pt and daughter verbalized understanding of results and education.  Written pt instructions provided.  F/U Clinic visit PRN.   Total time in face to face counseling 35 minutes.  Patient seen with Eunice Blase, PharmD Candidate.

## 2014-08-15 DIAGNOSIS — M4806 Spinal stenosis, lumbar region: Secondary | ICD-10-CM | POA: Diagnosis not present

## 2014-08-15 DIAGNOSIS — I5032 Chronic diastolic (congestive) heart failure: Secondary | ICD-10-CM | POA: Diagnosis not present

## 2014-08-15 DIAGNOSIS — G894 Chronic pain syndrome: Secondary | ICD-10-CM | POA: Diagnosis not present

## 2014-08-15 DIAGNOSIS — G309 Alzheimer's disease, unspecified: Secondary | ICD-10-CM | POA: Diagnosis not present

## 2014-08-15 DIAGNOSIS — I441 Atrioventricular block, second degree: Secondary | ICD-10-CM | POA: Diagnosis not present

## 2014-08-15 DIAGNOSIS — M25511 Pain in right shoulder: Secondary | ICD-10-CM | POA: Diagnosis not present

## 2014-08-15 NOTE — Progress Notes (Signed)
Patient ID: Jordan [name], female   DOB: 09/13/1919, 81 y.o.   MRN: [id number] Reviewed: Agree with Dr. Graylin Shiver documentation and management.

## 2014-08-16 DIAGNOSIS — I441 Atrioventricular block, second degree: Secondary | ICD-10-CM | POA: Diagnosis not present

## 2014-08-16 DIAGNOSIS — M25511 Pain in right shoulder: Secondary | ICD-10-CM | POA: Diagnosis not present

## 2014-08-16 DIAGNOSIS — N183 Chronic kidney disease, stage 3 (moderate): Secondary | ICD-10-CM | POA: Diagnosis not present

## 2014-08-16 DIAGNOSIS — G894 Chronic pain syndrome: Secondary | ICD-10-CM | POA: Diagnosis not present

## 2014-08-16 DIAGNOSIS — I739 Peripheral vascular disease, unspecified: Secondary | ICD-10-CM | POA: Diagnosis not present

## 2014-08-16 DIAGNOSIS — I129 Hypertensive chronic kidney disease with stage 1 through stage 4 chronic kidney disease, or unspecified chronic kidney disease: Secondary | ICD-10-CM | POA: Diagnosis not present

## 2014-08-16 DIAGNOSIS — M4806 Spinal stenosis, lumbar region: Secondary | ICD-10-CM | POA: Diagnosis not present

## 2014-08-16 DIAGNOSIS — F0280 Dementia in other diseases classified elsewhere without behavioral disturbance: Secondary | ICD-10-CM | POA: Diagnosis not present

## 2014-08-16 DIAGNOSIS — G309 Alzheimer's disease, unspecified: Secondary | ICD-10-CM | POA: Diagnosis not present

## 2014-08-16 DIAGNOSIS — I5032 Chronic diastolic (congestive) heart failure: Secondary | ICD-10-CM | POA: Diagnosis not present

## 2014-08-18 DIAGNOSIS — S52552D Other extraarticular fracture of lower end of left radius, subsequent encounter for closed fracture with routine healing: Secondary | ICD-10-CM | POA: Diagnosis not present

## 2014-08-21 ENCOUNTER — Encounter: Payer: Self-pay | Admitting: Family Medicine

## 2014-08-21 ENCOUNTER — Encounter: Payer: Self-pay | Admitting: *Deleted

## 2014-08-21 ENCOUNTER — Telehealth: Payer: Self-pay | Admitting: Family Medicine

## 2014-08-21 ENCOUNTER — Ambulatory Visit (INDEPENDENT_AMBULATORY_CARE_PROVIDER_SITE_OTHER): Payer: Medicare Other | Admitting: Family Medicine

## 2014-08-21 ENCOUNTER — Ambulatory Visit
Admission: RE | Admit: 2014-08-21 | Discharge: 2014-08-21 | Disposition: A | Discharge status: Home / Self Care | Payer: Medicare Other | Source: Ambulatory Visit | Attending: Family Medicine | Admitting: Family Medicine

## 2014-08-21 VITALS — BP 129/45 | HR 75 | Temp 98.2°F | Wt 131.6 lb

## 2014-08-21 DIAGNOSIS — R109 Unspecified abdominal pain: Secondary | ICD-10-CM | POA: Diagnosis not present

## 2014-08-21 DIAGNOSIS — R197 Diarrhea, unspecified: Secondary | ICD-10-CM | POA: Diagnosis present

## 2014-08-21 MED ORDER — ONDANSETRON 4 MG PO TBDP: 4.0000 mg | ORAL_TABLET | Freq: Three times a day (TID) | ORAL | Status: DC | PRN

## 2014-08-21 NOTE — Assessment & Plan Note (Addendum)
Patient with recent acute onset of diarrhea. Patient now having significant diarrhea and dehydrated on exam. Differential diagnosis: C. Difficile (unlikely in the absence of recent hospitalization and no recent antibiotic use), secondary to constipation, viral, underlying bacterial infection (E coli, campylobacter, etc).  I favor the diagnosis of constipation given opiate use and intermittent history constipation. I had a long discussion with the daughter about supportive care at home versus going to the emergency for IV fluids. She elected to try to manage this in the outpatient setting. I advised her to hold her Lasix to prevent further dehydration.  Treating her nausea with Zofran.  Abdominal films obtained today and were personally reviewed. Films were unremarkable and I agree with the radiology read.   Daughter to aggressively push fluids (Pedialyte/Gatorade/etc.).  If diarrhea improves as well as PO intake, she can continue to be managed supportively. If she further declines will need to go to the emergency room for evaluation. Daughter in agreement. Additionally, at the urging of her daughter I ordered a C. difficile and gave her testing supplies for that.

## 2014-08-21 NOTE — Patient Instructions (Signed)
It was nice to see you today.  Take the zofran as needed.  Be sure to keep her hydrated (offer fluids frequently; Pedialyte/Gatorade).  If she continues to do poorly, take her to the ED for fluids.  Stop the lasix until diarrhea has resolved.  Limit use of Percocet.

## 2014-08-21 NOTE — Telephone Encounter (Signed)
Will forward to pcp and covering provider to see if there is something she can try. Evaleen Sant,CMA

## 2014-08-21 NOTE — Progress Notes (Signed)
Prior Authorization received from [employer] for Ondansetron ODT . PA form completed and faxed to [place] for review.  Derl Barrow, RN

## 2014-08-21 NOTE — Telephone Encounter (Signed)
Spoke with patient's daughter and she has tried OTC Imodium and it has not helped. Advise her that patient keep appointment so that she can be seen.

## 2014-08-21 NOTE — Progress Notes (Signed)
   Subjective:    Patient ID: Jordan [name], female    DOB: 08/18/1919, 81 y.o.   MRN: [id number]  HPI 81 year old female with a complicated past medical history presents for same day appointment with complaints of diarrhea.  1) Diarrhea  History obtained primarily from the daughter who is a caregiver as patient has significant dementia and also does not speak [place].  Daughter reports that on Sunday the patient developed diarrhea. Bowel movements are very loose and watery. She had several of these and they subsequently stopped Sunday evening.  Patient did well for the next few days.  Patient subsequently developed diarrhea again late last night.  Daughter reports that she has had a proximally 6 episodes of diffuse watery diarrhea since that time.  She's been giving over-the-counter medications for this and pushing fluid intake.  No associated abdominal pain. She has been expressing nausea. No vomiting.  No abdominal pain. No fevers or chills.  She has been experiencing profound fatigue.   Daughter denies any changes in her medications. No recent antibiotic use. No changes in her diet.  No known sick contacts.  Social Hx - Nonsmoker.  Review of Systems  Constitutional: Positive for fatigue. Negative for fever and chills.  Gastrointestinal: Positive for nausea and diarrhea. Negative for vomiting and abdominal pain.  Neurological: Positive for weakness.      Objective:   Physical Exam Filed Vitals:   08/21/14 1407  BP: 129/45  Pulse: 75  Temp: 98.2 F (36.8 C)   Exam: General: Chronically ill-appearing female in no acute distress.  Appears fatigued. HEENT: Dry mucous membranes noted.   Cardiovascular: RRR. No murmurs, rubs, or gallops. Respiratory: CTAB. No rales, rhonchi, or wheeze. Abdomen: soft, nontender, nondistended. No palpable organomegaly. Positive bowel sounds throughout.    Assessment & Plan:  See Problem List  30 minutes were spent face-to-face with the  patient during this encounter and over half of that time was spent on counseling and coordination of care.

## 2014-08-21 NOTE — Telephone Encounter (Signed)
Pt has an appt today for diarrhea, however if there is something that the PCP could prescribe for her over the phone they would prefer this. Reason being that the pt has an arm in a cast, and she is not in very good shape to travel here for an appt. If there is something that we can do so that the pt may remain comfortable she and her daughter would greatly appreciate it / thanks [employer], ASA

## 2014-08-21 NOTE — Telephone Encounter (Signed)
Jordan [name] has called back and really needs to speak to someone before bring her mother here. Please call. jw

## 2014-08-25 ENCOUNTER — Telehealth: Payer: Self-pay | Admitting: *Deleted

## 2014-08-25 NOTE — Telephone Encounter (Signed)
Spoke with patient's daughter and she stated that patient now has constipation as of yesterday and would like something called in for that

## 2014-08-25 NOTE — Telephone Encounter (Signed)
Patient should increase Miralax if needed (2 to 3 times daily). She can also use an OTC Stool softener.

## 2014-08-25 NOTE — Telephone Encounter (Signed)
LVM at home and mobile for pt to call back and inform of below. Katharina Caper, April D

## 2014-08-25 NOTE — Telephone Encounter (Signed)
-----   Message from Coral Spikes, DO sent at 08/22/2014 11:02 AM EDT ----- Please inform daughter of negative xray.  No concerning findings. No stool/constipation seen.

## 2014-08-25 NOTE — Telephone Encounter (Signed)
Jordan [name] returned  Call

## 2014-08-25 NOTE — Progress Notes (Signed)
Received PA approval for Ondansetron ODT 4 mg tablet via [employer].  Med approved for 05/23/14 - 08/21/15.  [facility] pharmacy informed.  PA case number [id number]. Derl Barrow, RN

## 2014-08-26 ENCOUNTER — Telehealth: Payer: Self-pay | Admitting: Family Medicine

## 2014-08-26 DIAGNOSIS — G894 Chronic pain syndrome: Secondary | ICD-10-CM | POA: Diagnosis not present

## 2014-08-26 DIAGNOSIS — M4806 Spinal stenosis, lumbar region: Secondary | ICD-10-CM | POA: Diagnosis not present

## 2014-08-26 DIAGNOSIS — I129 Hypertensive chronic kidney disease with stage 1 through stage 4 chronic kidney disease, or unspecified chronic kidney disease: Secondary | ICD-10-CM | POA: Diagnosis not present

## 2014-08-26 DIAGNOSIS — I5032 Chronic diastolic (congestive) heart failure: Secondary | ICD-10-CM | POA: Diagnosis not present

## 2014-08-26 DIAGNOSIS — N183 Chronic kidney disease, stage 3 (moderate): Secondary | ICD-10-CM | POA: Diagnosis not present

## 2014-08-26 DIAGNOSIS — M25511 Pain in right shoulder: Secondary | ICD-10-CM | POA: Diagnosis not present

## 2014-08-26 NOTE — Telephone Encounter (Signed)
[name] faxed 9 pages for you to review and process.  Call her if you need to discuss documents.

## 2014-08-26 NOTE — Telephone Encounter (Signed)
Will forward to MD to make her aware.  [name],CMA  

## 2014-08-27 ENCOUNTER — Telehealth: Payer: Self-pay | Admitting: Family Medicine

## 2014-08-27 DIAGNOSIS — S52552D Other extraarticular fracture of lower end of left radius, subsequent encounter for closed fracture with routine healing: Secondary | ICD-10-CM | POA: Diagnosis not present

## 2014-08-27 NOTE — Telephone Encounter (Signed)
Spoke with Jordan [name] and advised her to sign a release of information through Dr. Irven Shelling office.  [facility] can send notes directly to their office. Kynzlie Hilleary,CMA

## 2014-08-27 NOTE — Telephone Encounter (Signed)
Pt's daughter, Stasia Cavalier is calling because the pt has an appointment with cardiologist, Dr. Adrian Prows, on May 5th and they need the pt's medical records in order to process the patient as a new patient / thanks Fonda Kinder, ASA

## 2014-09-01 ENCOUNTER — Telehealth: Payer: Self-pay | Admitting: Family Medicine

## 2014-09-01 NOTE — Telephone Encounter (Signed)
[name] called and wanted to know if the doctor called the insurance about her mother pain patches. The insurance company needs to talk to the doctor about this before they will approve it. jw

## 2014-09-01 NOTE — Telephone Encounter (Signed)
Will forward to MD to see if he has seen information regarding her lidocaine patches. [name],CMA

## 2014-09-02 ENCOUNTER — Other Ambulatory Visit: Payer: Self-pay | Admitting: Family Medicine

## 2014-09-02 DIAGNOSIS — M25511 Pain in right shoulder: Secondary | ICD-10-CM | POA: Diagnosis not present

## 2014-09-02 DIAGNOSIS — I129 Hypertensive chronic kidney disease with stage 1 through stage 4 chronic kidney disease, or unspecified chronic kidney disease: Secondary | ICD-10-CM | POA: Diagnosis not present

## 2014-09-02 DIAGNOSIS — M4806 Spinal stenosis, lumbar region: Secondary | ICD-10-CM | POA: Diagnosis not present

## 2014-09-02 DIAGNOSIS — G894 Chronic pain syndrome: Secondary | ICD-10-CM | POA: Diagnosis not present

## 2014-09-02 DIAGNOSIS — I5032 Chronic diastolic (congestive) heart failure: Secondary | ICD-10-CM | POA: Diagnosis not present

## 2014-09-02 DIAGNOSIS — N183 Chronic kidney disease, stage 3 (moderate): Secondary | ICD-10-CM | POA: Diagnosis not present

## 2014-09-04 ENCOUNTER — Ambulatory Visit: Payer: Medicare Other | Admitting: Physician Assistant

## 2014-09-04 DIAGNOSIS — I5032 Chronic diastolic (congestive) heart failure: Secondary | ICD-10-CM | POA: Diagnosis not present

## 2014-09-04 DIAGNOSIS — Z950 Presence of cardiac pacemaker: Secondary | ICD-10-CM | POA: Diagnosis not present

## 2014-09-04 DIAGNOSIS — I10 Essential (primary) hypertension: Secondary | ICD-10-CM | POA: Diagnosis not present

## 2014-09-04 DIAGNOSIS — Z8679 Personal history of other diseases of the circulatory system: Secondary | ICD-10-CM | POA: Diagnosis not present

## 2014-09-05 NOTE — Telephone Encounter (Signed)
Spoke with insurance company. They are faxing PA form to fill out.

## 2014-09-05 NOTE — Telephone Encounter (Signed)
I called and spoke to the patients insurance carrier. They are going to fax [place] a PA form to fill out. I will fill the form out once they send it to [place].

## 2014-09-05 NOTE — Telephone Encounter (Signed)
LM for daughter making her aware of script status. Dyna Figuereo,CMA

## 2014-09-11 DIAGNOSIS — I5032 Chronic diastolic (congestive) heart failure: Secondary | ICD-10-CM | POA: Diagnosis not present

## 2014-09-11 DIAGNOSIS — M4806 Spinal stenosis, lumbar region: Secondary | ICD-10-CM | POA: Diagnosis not present

## 2014-09-11 DIAGNOSIS — N183 Chronic kidney disease, stage 3 (moderate): Secondary | ICD-10-CM | POA: Diagnosis not present

## 2014-09-11 DIAGNOSIS — M25511 Pain in right shoulder: Secondary | ICD-10-CM | POA: Diagnosis not present

## 2014-09-11 DIAGNOSIS — I129 Hypertensive chronic kidney disease with stage 1 through stage 4 chronic kidney disease, or unspecified chronic kidney disease: Secondary | ICD-10-CM | POA: Diagnosis not present

## 2014-09-11 DIAGNOSIS — G894 Chronic pain syndrome: Secondary | ICD-10-CM | POA: Diagnosis not present

## 2014-09-15 DIAGNOSIS — I10 Essential (primary) hypertension: Secondary | ICD-10-CM | POA: Diagnosis not present

## 2014-09-16 ENCOUNTER — Telehealth: Payer: Self-pay | Admitting: Family Medicine

## 2014-09-16 NOTE — Telephone Encounter (Signed)
Daughter called and wanted an update on the forms that the insurance is suppose to be faxing. She was wondering if they did fax [place] and if we faxed them back. jw

## 2014-09-16 NOTE — Telephone Encounter (Signed)
Will forward to MD to check on form status. Talea Manges,CMA

## 2014-09-17 DIAGNOSIS — S52552D Other extraarticular fracture of lower end of left radius, subsequent encounter for closed fracture with routine healing: Secondary | ICD-10-CM | POA: Diagnosis not present

## 2014-09-18 DIAGNOSIS — M4806 Spinal stenosis, lumbar region: Secondary | ICD-10-CM | POA: Diagnosis not present

## 2014-09-18 DIAGNOSIS — N183 Chronic kidney disease, stage 3 (moderate): Secondary | ICD-10-CM | POA: Diagnosis not present

## 2014-09-18 DIAGNOSIS — I129 Hypertensive chronic kidney disease with stage 1 through stage 4 chronic kidney disease, or unspecified chronic kidney disease: Secondary | ICD-10-CM | POA: Diagnosis not present

## 2014-09-18 DIAGNOSIS — G894 Chronic pain syndrome: Secondary | ICD-10-CM | POA: Diagnosis not present

## 2014-09-18 DIAGNOSIS — I5032 Chronic diastolic (congestive) heart failure: Secondary | ICD-10-CM | POA: Diagnosis not present

## 2014-09-18 DIAGNOSIS — M25511 Pain in right shoulder: Secondary | ICD-10-CM | POA: Diagnosis not present

## 2014-09-18 NOTE — Telephone Encounter (Signed)
[facility]: was there 2 weeks agon. Weight has been constint for the past 2 days. Weight is now up 134.2 up 2.8 lbs.   But there is rhonci throughout her lungs No increase in sob. Oxygen today was 94 Please call [facility]. Will need verbal orders to extend

## 2014-09-18 NOTE — Telephone Encounter (Signed)
Will forward to MD. [name],CMA  

## 2014-09-19 MED ORDER — LIDOCAINE 5 % EX OINT: 1.0000 "application " | TOPICAL_OINTMENT | Freq: Every day | CUTANEOUS | Status: DC | PRN

## 2014-09-19 NOTE — Telephone Encounter (Signed)
[employer] [place]

## 2014-09-19 NOTE — Telephone Encounter (Signed)
Information received from insurance company. Lidocaine patches are not covered by her insurance for her issues, though lidocaine ointment is. Will send this to pharmacy.   Regarding the increase in weight, patient should be evaluated in clinic for this as she has been brittle with regards to fluid status in the past on diuretics. Can give verbal order for extending [place].  Please call them and inform of this. Thanks.

## 2014-09-19 NOTE — Telephone Encounter (Signed)
Spoke with daughter and she is aware of medication change.  Also LM for [facility] nurse amber to call clinic back to get verbal orders from MD. Jordan [name]

## 2014-09-24 ENCOUNTER — Ambulatory Visit: Payer: Medicare Other | Admitting: Physician Assistant

## 2014-09-24 DIAGNOSIS — G894 Chronic pain syndrome: Secondary | ICD-10-CM | POA: Diagnosis not present

## 2014-09-24 DIAGNOSIS — M25511 Pain in right shoulder: Secondary | ICD-10-CM | POA: Diagnosis not present

## 2014-09-24 DIAGNOSIS — I5032 Chronic diastolic (congestive) heart failure: Secondary | ICD-10-CM | POA: Diagnosis not present

## 2014-09-24 DIAGNOSIS — N183 Chronic kidney disease, stage 3 (moderate): Secondary | ICD-10-CM | POA: Diagnosis not present

## 2014-09-24 DIAGNOSIS — M4806 Spinal stenosis, lumbar region: Secondary | ICD-10-CM | POA: Diagnosis not present

## 2014-09-24 DIAGNOSIS — I129 Hypertensive chronic kidney disease with stage 1 through stage 4 chronic kidney disease, or unspecified chronic kidney disease: Secondary | ICD-10-CM | POA: Diagnosis not present

## 2014-09-25 DIAGNOSIS — I10 Essential (primary) hypertension: Secondary | ICD-10-CM | POA: Diagnosis not present

## 2014-09-25 DIAGNOSIS — Z950 Presence of cardiac pacemaker: Secondary | ICD-10-CM | POA: Diagnosis not present

## 2014-10-01 DIAGNOSIS — H4011X3 Primary open-angle glaucoma, severe stage: Secondary | ICD-10-CM | POA: Diagnosis not present

## 2014-10-02 DIAGNOSIS — G894 Chronic pain syndrome: Secondary | ICD-10-CM | POA: Diagnosis not present

## 2014-10-02 DIAGNOSIS — M4806 Spinal stenosis, lumbar region: Secondary | ICD-10-CM | POA: Diagnosis not present

## 2014-10-02 DIAGNOSIS — N183 Chronic kidney disease, stage 3 (moderate): Secondary | ICD-10-CM | POA: Diagnosis not present

## 2014-10-02 DIAGNOSIS — I5032 Chronic diastolic (congestive) heart failure: Secondary | ICD-10-CM | POA: Diagnosis not present

## 2014-10-02 DIAGNOSIS — M25511 Pain in right shoulder: Secondary | ICD-10-CM | POA: Diagnosis not present

## 2014-10-02 DIAGNOSIS — I129 Hypertensive chronic kidney disease with stage 1 through stage 4 chronic kidney disease, or unspecified chronic kidney disease: Secondary | ICD-10-CM | POA: Diagnosis not present

## 2014-10-05 ENCOUNTER — Other Ambulatory Visit: Payer: Self-pay | Admitting: Family Medicine

## 2014-10-09 DIAGNOSIS — I129 Hypertensive chronic kidney disease with stage 1 through stage 4 chronic kidney disease, or unspecified chronic kidney disease: Secondary | ICD-10-CM | POA: Diagnosis not present

## 2014-10-09 DIAGNOSIS — I5032 Chronic diastolic (congestive) heart failure: Secondary | ICD-10-CM | POA: Diagnosis not present

## 2014-10-09 DIAGNOSIS — N183 Chronic kidney disease, stage 3 (moderate): Secondary | ICD-10-CM | POA: Diagnosis not present

## 2014-10-09 DIAGNOSIS — M4806 Spinal stenosis, lumbar region: Secondary | ICD-10-CM | POA: Diagnosis not present

## 2014-10-09 DIAGNOSIS — G894 Chronic pain syndrome: Secondary | ICD-10-CM | POA: Diagnosis not present

## 2014-10-09 DIAGNOSIS — M25511 Pain in right shoulder: Secondary | ICD-10-CM | POA: Diagnosis not present

## 2014-10-14 DIAGNOSIS — M4806 Spinal stenosis, lumbar region: Secondary | ICD-10-CM | POA: Diagnosis not present

## 2014-10-14 DIAGNOSIS — G894 Chronic pain syndrome: Secondary | ICD-10-CM | POA: Diagnosis not present

## 2014-10-14 DIAGNOSIS — I5032 Chronic diastolic (congestive) heart failure: Secondary | ICD-10-CM | POA: Diagnosis not present

## 2014-10-14 DIAGNOSIS — N183 Chronic kidney disease, stage 3 (moderate): Secondary | ICD-10-CM | POA: Diagnosis not present

## 2014-10-14 DIAGNOSIS — M25511 Pain in right shoulder: Secondary | ICD-10-CM | POA: Diagnosis not present

## 2014-10-14 DIAGNOSIS — I129 Hypertensive chronic kidney disease with stage 1 through stage 4 chronic kidney disease, or unspecified chronic kidney disease: Secondary | ICD-10-CM | POA: Diagnosis not present

## 2014-10-15 DIAGNOSIS — F0280 Dementia in other diseases classified elsewhere without behavioral disturbance: Secondary | ICD-10-CM | POA: Diagnosis not present

## 2014-10-15 DIAGNOSIS — I739 Peripheral vascular disease, unspecified: Secondary | ICD-10-CM | POA: Diagnosis not present

## 2014-10-15 DIAGNOSIS — I441 Atrioventricular block, second degree: Secondary | ICD-10-CM | POA: Diagnosis not present

## 2014-10-15 DIAGNOSIS — N183 Chronic kidney disease, stage 3 (moderate): Secondary | ICD-10-CM | POA: Diagnosis not present

## 2014-10-15 DIAGNOSIS — I129 Hypertensive chronic kidney disease with stage 1 through stage 4 chronic kidney disease, or unspecified chronic kidney disease: Secondary | ICD-10-CM | POA: Diagnosis not present

## 2014-10-15 DIAGNOSIS — M4806 Spinal stenosis, lumbar region: Secondary | ICD-10-CM | POA: Diagnosis not present

## 2014-10-15 DIAGNOSIS — S52552D Other extraarticular fracture of lower end of left radius, subsequent encounter for closed fracture with routine healing: Secondary | ICD-10-CM | POA: Diagnosis not present

## 2014-10-15 DIAGNOSIS — G309 Alzheimer's disease, unspecified: Secondary | ICD-10-CM | POA: Diagnosis not present

## 2014-10-15 DIAGNOSIS — M25532 Pain in left wrist: Secondary | ICD-10-CM | POA: Diagnosis not present

## 2014-10-15 DIAGNOSIS — G894 Chronic pain syndrome: Secondary | ICD-10-CM | POA: Diagnosis not present

## 2014-10-15 DIAGNOSIS — S5292XD Unspecified fracture of left forearm, subsequent encounter for closed fracture with routine healing: Secondary | ICD-10-CM | POA: Diagnosis not present

## 2014-10-15 DIAGNOSIS — I5032 Chronic diastolic (congestive) heart failure: Secondary | ICD-10-CM | POA: Diagnosis not present

## 2014-10-20 ENCOUNTER — Other Ambulatory Visit: Payer: Self-pay | Admitting: Family Medicine

## 2014-10-20 ENCOUNTER — Encounter: Payer: Medicare Other | Admitting: *Deleted

## 2014-10-20 ENCOUNTER — Telehealth: Payer: Self-pay | Admitting: Cardiology

## 2014-10-20 DIAGNOSIS — Z950 Presence of cardiac pacemaker: Secondary | ICD-10-CM | POA: Diagnosis not present

## 2014-10-20 NOTE — Telephone Encounter (Signed)
LMOVM reminding pt to send remote transmission.   

## 2014-10-21 ENCOUNTER — Encounter: Payer: Self-pay | Admitting: Cardiology

## 2014-10-23 DIAGNOSIS — G894 Chronic pain syndrome: Secondary | ICD-10-CM | POA: Diagnosis not present

## 2014-10-23 DIAGNOSIS — I129 Hypertensive chronic kidney disease with stage 1 through stage 4 chronic kidney disease, or unspecified chronic kidney disease: Secondary | ICD-10-CM | POA: Diagnosis not present

## 2014-10-23 DIAGNOSIS — N183 Chronic kidney disease, stage 3 (moderate): Secondary | ICD-10-CM | POA: Diagnosis not present

## 2014-10-23 DIAGNOSIS — I5032 Chronic diastolic (congestive) heart failure: Secondary | ICD-10-CM | POA: Diagnosis not present

## 2014-10-23 DIAGNOSIS — S5292XD Unspecified fracture of left forearm, subsequent encounter for closed fracture with routine healing: Secondary | ICD-10-CM | POA: Diagnosis not present

## 2014-10-23 DIAGNOSIS — M4806 Spinal stenosis, lumbar region: Secondary | ICD-10-CM | POA: Diagnosis not present

## 2014-10-24 DIAGNOSIS — I5032 Chronic diastolic (congestive) heart failure: Secondary | ICD-10-CM | POA: Diagnosis not present

## 2014-10-24 DIAGNOSIS — N183 Chronic kidney disease, stage 3 (moderate): Secondary | ICD-10-CM | POA: Diagnosis not present

## 2014-10-24 DIAGNOSIS — G894 Chronic pain syndrome: Secondary | ICD-10-CM | POA: Diagnosis not present

## 2014-10-24 DIAGNOSIS — I129 Hypertensive chronic kidney disease with stage 1 through stage 4 chronic kidney disease, or unspecified chronic kidney disease: Secondary | ICD-10-CM | POA: Diagnosis not present

## 2014-10-24 DIAGNOSIS — S5292XD Unspecified fracture of left forearm, subsequent encounter for closed fracture with routine healing: Secondary | ICD-10-CM | POA: Diagnosis not present

## 2014-10-24 DIAGNOSIS — M4806 Spinal stenosis, lumbar region: Secondary | ICD-10-CM | POA: Diagnosis not present

## 2014-10-27 ENCOUNTER — Other Ambulatory Visit: Payer: Self-pay

## 2014-10-28 DIAGNOSIS — N183 Chronic kidney disease, stage 3 (moderate): Secondary | ICD-10-CM | POA: Diagnosis not present

## 2014-10-28 DIAGNOSIS — M4806 Spinal stenosis, lumbar region: Secondary | ICD-10-CM | POA: Diagnosis not present

## 2014-10-28 DIAGNOSIS — S5292XD Unspecified fracture of left forearm, subsequent encounter for closed fracture with routine healing: Secondary | ICD-10-CM | POA: Diagnosis not present

## 2014-10-28 DIAGNOSIS — G894 Chronic pain syndrome: Secondary | ICD-10-CM | POA: Diagnosis not present

## 2014-10-28 DIAGNOSIS — I129 Hypertensive chronic kidney disease with stage 1 through stage 4 chronic kidney disease, or unspecified chronic kidney disease: Secondary | ICD-10-CM | POA: Diagnosis not present

## 2014-10-28 DIAGNOSIS — I5032 Chronic diastolic (congestive) heart failure: Secondary | ICD-10-CM | POA: Diagnosis not present

## 2014-10-31 DIAGNOSIS — S5292XD Unspecified fracture of left forearm, subsequent encounter for closed fracture with routine healing: Secondary | ICD-10-CM | POA: Diagnosis not present

## 2014-10-31 DIAGNOSIS — I5032 Chronic diastolic (congestive) heart failure: Secondary | ICD-10-CM | POA: Diagnosis not present

## 2014-10-31 DIAGNOSIS — M4806 Spinal stenosis, lumbar region: Secondary | ICD-10-CM | POA: Diagnosis not present

## 2014-10-31 DIAGNOSIS — G894 Chronic pain syndrome: Secondary | ICD-10-CM | POA: Diagnosis not present

## 2014-10-31 DIAGNOSIS — I129 Hypertensive chronic kidney disease with stage 1 through stage 4 chronic kidney disease, or unspecified chronic kidney disease: Secondary | ICD-10-CM | POA: Diagnosis not present

## 2014-10-31 DIAGNOSIS — N183 Chronic kidney disease, stage 3 (moderate): Secondary | ICD-10-CM | POA: Diagnosis not present

## 2014-11-05 ENCOUNTER — Telehealth: Payer: Self-pay | Admitting: Internal Medicine

## 2014-11-05 DIAGNOSIS — G894 Chronic pain syndrome: Secondary | ICD-10-CM | POA: Diagnosis not present

## 2014-11-05 DIAGNOSIS — S5292XD Unspecified fracture of left forearm, subsequent encounter for closed fracture with routine healing: Secondary | ICD-10-CM | POA: Diagnosis not present

## 2014-11-05 DIAGNOSIS — I5032 Chronic diastolic (congestive) heart failure: Secondary | ICD-10-CM | POA: Diagnosis not present

## 2014-11-05 DIAGNOSIS — N183 Chronic kidney disease, stage 3 (moderate): Secondary | ICD-10-CM | POA: Diagnosis not present

## 2014-11-05 DIAGNOSIS — M4806 Spinal stenosis, lumbar region: Secondary | ICD-10-CM | POA: Diagnosis not present

## 2014-11-05 DIAGNOSIS — I129 Hypertensive chronic kidney disease with stage 1 through stage 4 chronic kidney disease, or unspecified chronic kidney disease: Secondary | ICD-10-CM | POA: Diagnosis not present

## 2014-11-05 NOTE — Telephone Encounter (Signed)
LM for Jordan [name] to let her know that this form was refaxed. [name],CMA

## 2014-11-05 NOTE — Telephone Encounter (Signed)
Darilyn from [place] is calling and she says that orders were faxed yesterday for this patient, but she couldn't tell who signed them. She just needs the bottom where they were signed to be re-faxed, ATTN: [place] @ Fax# [phone]. She may be reach at telephone # [phone] ext [phone]. Thank you, Fonda Kinder, ASA

## 2014-11-06 DIAGNOSIS — I5032 Chronic diastolic (congestive) heart failure: Secondary | ICD-10-CM | POA: Diagnosis not present

## 2014-11-06 DIAGNOSIS — S5292XD Unspecified fracture of left forearm, subsequent encounter for closed fracture with routine healing: Secondary | ICD-10-CM | POA: Diagnosis not present

## 2014-11-06 DIAGNOSIS — N183 Chronic kidney disease, stage 3 (moderate): Secondary | ICD-10-CM | POA: Diagnosis not present

## 2014-11-06 DIAGNOSIS — G894 Chronic pain syndrome: Secondary | ICD-10-CM | POA: Diagnosis not present

## 2014-11-06 DIAGNOSIS — I129 Hypertensive chronic kidney disease with stage 1 through stage 4 chronic kidney disease, or unspecified chronic kidney disease: Secondary | ICD-10-CM | POA: Diagnosis not present

## 2014-11-06 DIAGNOSIS — M4806 Spinal stenosis, lumbar region: Secondary | ICD-10-CM | POA: Diagnosis not present

## 2014-11-07 DIAGNOSIS — I129 Hypertensive chronic kidney disease with stage 1 through stage 4 chronic kidney disease, or unspecified chronic kidney disease: Secondary | ICD-10-CM | POA: Diagnosis not present

## 2014-11-07 DIAGNOSIS — S5292XD Unspecified fracture of left forearm, subsequent encounter for closed fracture with routine healing: Secondary | ICD-10-CM | POA: Diagnosis not present

## 2014-11-07 DIAGNOSIS — I5032 Chronic diastolic (congestive) heart failure: Secondary | ICD-10-CM | POA: Diagnosis not present

## 2014-11-07 DIAGNOSIS — M4806 Spinal stenosis, lumbar region: Secondary | ICD-10-CM | POA: Diagnosis not present

## 2014-11-07 DIAGNOSIS — G894 Chronic pain syndrome: Secondary | ICD-10-CM | POA: Diagnosis not present

## 2014-11-07 DIAGNOSIS — N183 Chronic kidney disease, stage 3 (moderate): Secondary | ICD-10-CM | POA: Diagnosis not present

## 2014-11-11 ENCOUNTER — Telehealth: Payer: Self-pay | Admitting: Internal Medicine

## 2014-11-11 DIAGNOSIS — M4806 Spinal stenosis, lumbar region: Secondary | ICD-10-CM | POA: Diagnosis not present

## 2014-11-11 DIAGNOSIS — S5292XD Unspecified fracture of left forearm, subsequent encounter for closed fracture with routine healing: Secondary | ICD-10-CM | POA: Diagnosis not present

## 2014-11-11 DIAGNOSIS — I5032 Chronic diastolic (congestive) heart failure: Secondary | ICD-10-CM | POA: Diagnosis not present

## 2014-11-11 DIAGNOSIS — N183 Chronic kidney disease, stage 3 (moderate): Secondary | ICD-10-CM | POA: Diagnosis not present

## 2014-11-11 DIAGNOSIS — I129 Hypertensive chronic kidney disease with stage 1 through stage 4 chronic kidney disease, or unspecified chronic kidney disease: Secondary | ICD-10-CM | POA: Diagnosis not present

## 2014-11-11 DIAGNOSIS — G894 Chronic pain syndrome: Secondary | ICD-10-CM | POA: Diagnosis not present

## 2014-11-11 NOTE — Telephone Encounter (Signed)
Needs verbal orders to continue home health care Message can be left-it is a personal line

## 2014-11-11 NOTE — Telephone Encounter (Signed)
Will forward to MD. [name],CMA  

## 2014-11-11 NOTE — Telephone Encounter (Signed)
I am unable to provide verbal orders for home health care as I am not a [employer] certified provider. I received the fax of patient's home health application today, had it signed by an attending, and placed it in the to be faxed box.

## 2014-11-12 DIAGNOSIS — G894 Chronic pain syndrome: Secondary | ICD-10-CM | POA: Diagnosis not present

## 2014-11-12 DIAGNOSIS — N183 Chronic kidney disease, stage 3 (moderate): Secondary | ICD-10-CM | POA: Diagnosis not present

## 2014-11-12 DIAGNOSIS — I5032 Chronic diastolic (congestive) heart failure: Secondary | ICD-10-CM | POA: Diagnosis not present

## 2014-11-12 DIAGNOSIS — I129 Hypertensive chronic kidney disease with stage 1 through stage 4 chronic kidney disease, or unspecified chronic kidney disease: Secondary | ICD-10-CM | POA: Diagnosis not present

## 2014-11-12 DIAGNOSIS — S5292XD Unspecified fracture of left forearm, subsequent encounter for closed fracture with routine healing: Secondary | ICD-10-CM | POA: Diagnosis not present

## 2014-11-12 DIAGNOSIS — M4806 Spinal stenosis, lumbar region: Secondary | ICD-10-CM | POA: Diagnosis not present

## 2014-11-13 DIAGNOSIS — I129 Hypertensive chronic kidney disease with stage 1 through stage 4 chronic kidney disease, or unspecified chronic kidney disease: Secondary | ICD-10-CM | POA: Diagnosis not present

## 2014-11-13 DIAGNOSIS — I5032 Chronic diastolic (congestive) heart failure: Secondary | ICD-10-CM | POA: Diagnosis not present

## 2014-11-13 DIAGNOSIS — G894 Chronic pain syndrome: Secondary | ICD-10-CM | POA: Diagnosis not present

## 2014-11-13 DIAGNOSIS — M4806 Spinal stenosis, lumbar region: Secondary | ICD-10-CM | POA: Diagnosis not present

## 2014-11-13 DIAGNOSIS — N183 Chronic kidney disease, stage 3 (moderate): Secondary | ICD-10-CM | POA: Diagnosis not present

## 2014-11-13 DIAGNOSIS — S5292XD Unspecified fracture of left forearm, subsequent encounter for closed fracture with routine healing: Secondary | ICD-10-CM | POA: Diagnosis not present

## 2014-11-14 ENCOUNTER — Other Ambulatory Visit: Payer: Self-pay | Admitting: Family Medicine

## 2014-11-14 DIAGNOSIS — I5032 Chronic diastolic (congestive) heart failure: Secondary | ICD-10-CM | POA: Diagnosis not present

## 2014-11-14 DIAGNOSIS — G894 Chronic pain syndrome: Secondary | ICD-10-CM | POA: Diagnosis not present

## 2014-11-14 DIAGNOSIS — S5292XD Unspecified fracture of left forearm, subsequent encounter for closed fracture with routine healing: Secondary | ICD-10-CM | POA: Diagnosis not present

## 2014-11-14 DIAGNOSIS — I129 Hypertensive chronic kidney disease with stage 1 through stage 4 chronic kidney disease, or unspecified chronic kidney disease: Secondary | ICD-10-CM | POA: Diagnosis not present

## 2014-11-14 DIAGNOSIS — M4806 Spinal stenosis, lumbar region: Secondary | ICD-10-CM | POA: Diagnosis not present

## 2014-11-14 DIAGNOSIS — N183 Chronic kidney disease, stage 3 (moderate): Secondary | ICD-10-CM | POA: Diagnosis not present

## 2014-11-17 ENCOUNTER — Telehealth: Payer: Self-pay | Admitting: Internal Medicine

## 2014-11-17 MED ORDER — ALLOPURINOL 100 MG PO TABS: 100.0000 mg | ORAL_TABLET | Freq: Every day | ORAL | Status: DC

## 2014-11-17 NOTE — Telephone Encounter (Signed)
Daughter called and would like a refill on her mother's Allopurinol called in. They were getting this from the mothers cardiologist, but they no longer see that doctor. jw

## 2014-11-17 NOTE — Telephone Encounter (Signed)
Refilled Allopurinol from patient's current med list.

## 2014-11-18 DIAGNOSIS — M4806 Spinal stenosis, lumbar region: Secondary | ICD-10-CM | POA: Diagnosis not present

## 2014-11-18 DIAGNOSIS — S5292XD Unspecified fracture of left forearm, subsequent encounter for closed fracture with routine healing: Secondary | ICD-10-CM | POA: Diagnosis not present

## 2014-11-18 DIAGNOSIS — G894 Chronic pain syndrome: Secondary | ICD-10-CM | POA: Diagnosis not present

## 2014-11-18 DIAGNOSIS — I129 Hypertensive chronic kidney disease with stage 1 through stage 4 chronic kidney disease, or unspecified chronic kidney disease: Secondary | ICD-10-CM | POA: Diagnosis not present

## 2014-11-18 DIAGNOSIS — N183 Chronic kidney disease, stage 3 (moderate): Secondary | ICD-10-CM | POA: Diagnosis not present

## 2014-11-18 DIAGNOSIS — I5032 Chronic diastolic (congestive) heart failure: Secondary | ICD-10-CM | POA: Diagnosis not present

## 2014-11-19 DIAGNOSIS — S52552D Other extraarticular fracture of lower end of left radius, subsequent encounter for closed fracture with routine healing: Secondary | ICD-10-CM | POA: Diagnosis not present

## 2014-11-19 DIAGNOSIS — M19011 Primary osteoarthritis, right shoulder: Secondary | ICD-10-CM | POA: Diagnosis not present

## 2014-11-20 DIAGNOSIS — I129 Hypertensive chronic kidney disease with stage 1 through stage 4 chronic kidney disease, or unspecified chronic kidney disease: Secondary | ICD-10-CM | POA: Diagnosis not present

## 2014-11-20 DIAGNOSIS — S5292XD Unspecified fracture of left forearm, subsequent encounter for closed fracture with routine healing: Secondary | ICD-10-CM | POA: Diagnosis not present

## 2014-11-20 DIAGNOSIS — G894 Chronic pain syndrome: Secondary | ICD-10-CM | POA: Diagnosis not present

## 2014-11-20 DIAGNOSIS — M4806 Spinal stenosis, lumbar region: Secondary | ICD-10-CM | POA: Diagnosis not present

## 2014-11-20 DIAGNOSIS — I5032 Chronic diastolic (congestive) heart failure: Secondary | ICD-10-CM | POA: Diagnosis not present

## 2014-11-20 DIAGNOSIS — N183 Chronic kidney disease, stage 3 (moderate): Secondary | ICD-10-CM | POA: Diagnosis not present

## 2014-11-24 DIAGNOSIS — S52552A Other extraarticular fracture of lower end of left radius, initial encounter for closed fracture: Secondary | ICD-10-CM | POA: Diagnosis not present

## 2014-11-24 DIAGNOSIS — I5032 Chronic diastolic (congestive) heart failure: Secondary | ICD-10-CM | POA: Diagnosis not present

## 2014-11-24 DIAGNOSIS — N183 Chronic kidney disease, stage 3 (moderate): Secondary | ICD-10-CM | POA: Diagnosis not present

## 2014-11-24 DIAGNOSIS — I129 Hypertensive chronic kidney disease with stage 1 through stage 4 chronic kidney disease, or unspecified chronic kidney disease: Secondary | ICD-10-CM | POA: Diagnosis not present

## 2014-11-24 DIAGNOSIS — G894 Chronic pain syndrome: Secondary | ICD-10-CM | POA: Diagnosis not present

## 2014-11-24 DIAGNOSIS — S5292XD Unspecified fracture of left forearm, subsequent encounter for closed fracture with routine healing: Secondary | ICD-10-CM | POA: Diagnosis not present

## 2014-11-24 DIAGNOSIS — M4806 Spinal stenosis, lumbar region: Secondary | ICD-10-CM | POA: Diagnosis not present

## 2014-11-26 DIAGNOSIS — M25532 Pain in left wrist: Secondary | ICD-10-CM | POA: Diagnosis not present

## 2014-11-26 DIAGNOSIS — G5602 Carpal tunnel syndrome, left upper limb: Secondary | ICD-10-CM | POA: Diagnosis not present

## 2014-11-26 DIAGNOSIS — S52552D Other extraarticular fracture of lower end of left radius, subsequent encounter for closed fracture with routine healing: Secondary | ICD-10-CM | POA: Diagnosis not present

## 2014-11-27 ENCOUNTER — Other Ambulatory Visit: Payer: Self-pay | Admitting: Family Medicine

## 2014-11-27 DIAGNOSIS — I5032 Chronic diastolic (congestive) heart failure: Secondary | ICD-10-CM | POA: Diagnosis not present

## 2014-11-27 DIAGNOSIS — N183 Chronic kidney disease, stage 3 (moderate): Secondary | ICD-10-CM | POA: Diagnosis not present

## 2014-11-27 DIAGNOSIS — S5292XD Unspecified fracture of left forearm, subsequent encounter for closed fracture with routine healing: Secondary | ICD-10-CM | POA: Diagnosis not present

## 2014-11-27 DIAGNOSIS — M4806 Spinal stenosis, lumbar region: Secondary | ICD-10-CM | POA: Diagnosis not present

## 2014-11-27 DIAGNOSIS — G894 Chronic pain syndrome: Secondary | ICD-10-CM | POA: Diagnosis not present

## 2014-11-27 DIAGNOSIS — I129 Hypertensive chronic kidney disease with stage 1 through stage 4 chronic kidney disease, or unspecified chronic kidney disease: Secondary | ICD-10-CM | POA: Diagnosis not present

## 2014-11-28 NOTE — Telephone Encounter (Signed)
OK to fill

## 2014-11-28 NOTE — Telephone Encounter (Signed)
Ok to fill 

## 2014-11-28 NOTE — Telephone Encounter (Signed)
Please advise refill as Dr. Caryl Bis is not in that office anymore.

## 2014-12-01 DIAGNOSIS — N183 Chronic kidney disease, stage 3 (moderate): Secondary | ICD-10-CM | POA: Diagnosis not present

## 2014-12-01 DIAGNOSIS — I5032 Chronic diastolic (congestive) heart failure: Secondary | ICD-10-CM | POA: Diagnosis not present

## 2014-12-01 DIAGNOSIS — M4806 Spinal stenosis, lumbar region: Secondary | ICD-10-CM | POA: Diagnosis not present

## 2014-12-01 DIAGNOSIS — I129 Hypertensive chronic kidney disease with stage 1 through stage 4 chronic kidney disease, or unspecified chronic kidney disease: Secondary | ICD-10-CM | POA: Diagnosis not present

## 2014-12-01 DIAGNOSIS — G894 Chronic pain syndrome: Secondary | ICD-10-CM | POA: Diagnosis not present

## 2014-12-01 DIAGNOSIS — S5292XD Unspecified fracture of left forearm, subsequent encounter for closed fracture with routine healing: Secondary | ICD-10-CM | POA: Diagnosis not present

## 2014-12-02 ENCOUNTER — Ambulatory Visit: Payer: Medicare Other | Admitting: Family Medicine

## 2014-12-03 DIAGNOSIS — S52552D Other extraarticular fracture of lower end of left radius, subsequent encounter for closed fracture with routine healing: Secondary | ICD-10-CM | POA: Diagnosis not present

## 2014-12-04 DIAGNOSIS — N183 Chronic kidney disease, stage 3 (moderate): Secondary | ICD-10-CM | POA: Diagnosis not present

## 2014-12-04 DIAGNOSIS — S5292XD Unspecified fracture of left forearm, subsequent encounter for closed fracture with routine healing: Secondary | ICD-10-CM | POA: Diagnosis not present

## 2014-12-04 DIAGNOSIS — I129 Hypertensive chronic kidney disease with stage 1 through stage 4 chronic kidney disease, or unspecified chronic kidney disease: Secondary | ICD-10-CM | POA: Diagnosis not present

## 2014-12-04 DIAGNOSIS — I5032 Chronic diastolic (congestive) heart failure: Secondary | ICD-10-CM | POA: Diagnosis not present

## 2014-12-04 DIAGNOSIS — M4806 Spinal stenosis, lumbar region: Secondary | ICD-10-CM | POA: Diagnosis not present

## 2014-12-04 DIAGNOSIS — G894 Chronic pain syndrome: Secondary | ICD-10-CM | POA: Diagnosis not present

## 2014-12-09 DIAGNOSIS — S52552D Other extraarticular fracture of lower end of left radius, subsequent encounter for closed fracture with routine healing: Secondary | ICD-10-CM | POA: Diagnosis not present

## 2014-12-12 DIAGNOSIS — M4806 Spinal stenosis, lumbar region: Secondary | ICD-10-CM | POA: Diagnosis not present

## 2014-12-12 DIAGNOSIS — G894 Chronic pain syndrome: Secondary | ICD-10-CM | POA: Diagnosis not present

## 2014-12-12 DIAGNOSIS — I129 Hypertensive chronic kidney disease with stage 1 through stage 4 chronic kidney disease, or unspecified chronic kidney disease: Secondary | ICD-10-CM | POA: Diagnosis not present

## 2014-12-12 DIAGNOSIS — S5292XD Unspecified fracture of left forearm, subsequent encounter for closed fracture with routine healing: Secondary | ICD-10-CM | POA: Diagnosis not present

## 2014-12-12 DIAGNOSIS — N183 Chronic kidney disease, stage 3 (moderate): Secondary | ICD-10-CM | POA: Diagnosis not present

## 2014-12-12 DIAGNOSIS — I5032 Chronic diastolic (congestive) heart failure: Secondary | ICD-10-CM | POA: Diagnosis not present

## 2014-12-16 DIAGNOSIS — S52552D Other extraarticular fracture of lower end of left radius, subsequent encounter for closed fracture with routine healing: Secondary | ICD-10-CM | POA: Diagnosis not present

## 2014-12-18 ENCOUNTER — Encounter: Payer: Self-pay | Admitting: Family Medicine

## 2014-12-18 ENCOUNTER — Ambulatory Visit (INDEPENDENT_AMBULATORY_CARE_PROVIDER_SITE_OTHER): Payer: Medicare Other | Admitting: Family Medicine

## 2014-12-18 VITALS — BP 123/103 | HR 69 | Temp 97.8°F | Ht <= 58 in | Wt 136.8 lb

## 2014-12-18 DIAGNOSIS — N183 Chronic kidney disease, stage 3 unspecified: Secondary | ICD-10-CM

## 2014-12-18 DIAGNOSIS — K219 Gastro-esophageal reflux disease without esophagitis: Secondary | ICD-10-CM | POA: Diagnosis present

## 2014-12-18 MED ORDER — OMEPRAZOLE 20 MG PO CPDR: 20.0000 mg | DELAYED_RELEASE_CAPSULE | Freq: Two times a day (BID) | ORAL | Status: DC

## 2014-12-18 MED ORDER — ESOMEPRAZOLE MAGNESIUM 40 MG PO CPDR: 40.0000 mg | DELAYED_RELEASE_CAPSULE | Freq: Two times a day (BID) | ORAL | Status: DC

## 2014-12-18 NOTE — Assessment & Plan Note (Signed)
Stable on last check in March. Will defer follow up BMP for 6 month follow up. Discussed limitation of NSAIDs for right shoulder pain.

## 2014-12-18 NOTE — Assessment & Plan Note (Addendum)
Dyspeptic symptoms with waning improvement to PPIs. Will increase to BID and f/u with PCP to discuss further testing/GI referral. "Chest pain" as described as reason for visit is actually epigastric pain that is nonanginal.

## 2014-12-18 NOTE — Patient Instructions (Signed)
Thank you for coming in today!  - You can start taking nexium twice a day instead of just once. You can continue this until you follow up with your PCP, Dr. Dallas Schimke in about 1 month.  - It is ok to take the pain medication for a short time, but don't take it for more than a week or two. You will need your kidney function checked at your next office visit to make sure this is still ok.   Our clinic's number is [phone]. Feel free to call any time with questions or concerns. We will answer any questions after hours with our 24-hour emergency line at that number as well.   - Dr. Bonner Puna

## 2014-12-21 ENCOUNTER — Other Ambulatory Visit: Payer: Self-pay | Admitting: Family Medicine

## 2014-12-22 ENCOUNTER — Telehealth: Payer: Self-pay | Admitting: Internal Medicine

## 2014-12-22 NOTE — Telephone Encounter (Signed)
Please call daughter back to discuss mom taking meloxicam.

## 2014-12-23 NOTE — Progress Notes (Signed)
Subjective: Jordan [name] is a 81 y.o. female presenting for SDA for GERD symptoms.  Accompanied by her son who interprets for this encounter in the presence of pacific interpretor, he relays that she has had epigastric discomfort described as burning worse when laying down and after meals, not related to exertion or involving the chest. No fever, nausea, vomiting, diarrhea, constipation, lower abdominal pain. She would like to know if she can taken a medication prescribed by another physician for right shoulder pain.  PMH: GERD for which she takes nexium once daily. No pancreatitis, hepatitis. SH: No EtOH  Objective: BP 123/103 mmHg  Pulse 69  Temp(Src) 97.8 F (36.6 C) (Oral)  Ht 4\' 9"  (1.448 m)  Wt 136 lb 12.8 oz (62.052 kg)  BMI 29.60 kg/m2 Gen: Elderly 81 y.o. female in no distress Chest: Regular rate, no murmur, no LE edema, no JVD, cap refill < 2 sec. No pain on palpation. Pulm: Non-labored breathing ambient air, clear sounds GI: Normoactive BS; soft, non-tender, non-distended, no organomegaly, no hernia appreciated  Assessment/Plan: Jordan [name] is a 81 y.o. female here for GERD.  See problem list for plan.

## 2014-12-24 DIAGNOSIS — S52552A Other extraarticular fracture of lower end of left radius, initial encounter for closed fracture: Secondary | ICD-10-CM | POA: Diagnosis not present

## 2014-12-24 NOTE — Telephone Encounter (Signed)
Spoke with daughter and she needed clarification on recommendations for patient's vivlodex pain medication.  She wanted to make sure that she was to take medication for 2 weeks and that this wasn't meant for nexium.  Verified patient instructions given at the end of visit.  Patient is to double up on her nexium and follow up in 1 month and to only take 1-2 weeks of pain medication.  Daughter voiced understanding and confirmed appt with new PCP on 01-20-15. [name],CMA

## 2014-12-27 DIAGNOSIS — Z950 Presence of cardiac pacemaker: Secondary | ICD-10-CM | POA: Diagnosis not present

## 2014-12-28 ENCOUNTER — Other Ambulatory Visit: Payer: Self-pay | Admitting: Family Medicine

## 2015-01-04 ENCOUNTER — Other Ambulatory Visit: Payer: Self-pay | Admitting: Family Medicine

## 2015-01-07 DIAGNOSIS — H4011X3 Primary open-angle glaucoma, severe stage: Secondary | ICD-10-CM | POA: Diagnosis not present

## 2015-01-13 ENCOUNTER — Telehealth: Payer: Self-pay | Admitting: Internal Medicine

## 2015-01-13 NOTE — Telephone Encounter (Signed)
Will forward to PCP for review. Pt has appt with PCP on 01/20/15. Sophia Cubero, CMA.

## 2015-01-13 NOTE — Telephone Encounter (Signed)
If hernia is not reducible or if stomach pain is worsening patient most likely needs to seek medical care.

## 2015-01-13 NOTE — Telephone Encounter (Signed)
Mom says she feels the hernia has moved to the center of the stomach.  She is having stomach pains since this morning. 2 days ago she was having cold symptons but was better the next day.  Blood pressure seems to be ok Please advise

## 2015-01-14 DIAGNOSIS — S52552D Other extraarticular fracture of lower end of left radius, subsequent encounter for closed fracture with routine healing: Secondary | ICD-10-CM | POA: Diagnosis not present

## 2015-01-14 DIAGNOSIS — G5602 Carpal tunnel syndrome, left upper limb: Secondary | ICD-10-CM | POA: Diagnosis not present

## 2015-01-14 DIAGNOSIS — M25532 Pain in left wrist: Secondary | ICD-10-CM | POA: Diagnosis not present

## 2015-01-14 NOTE — Telephone Encounter (Signed)
Spoke with daughter and advised of her message from MD.  She will check on mom when she gets home today and if need be will call tomorrow for an appt to discuss this increased pain and discomfort.  Norleen Xie,CMA

## 2015-01-20 ENCOUNTER — Encounter: Payer: Self-pay | Admitting: Internal Medicine

## 2015-01-20 ENCOUNTER — Ambulatory Visit (INDEPENDENT_AMBULATORY_CARE_PROVIDER_SITE_OTHER): Payer: Medicare Other | Admitting: Internal Medicine

## 2015-01-20 VITALS — BP 138/68 | HR 75 | Temp 98.5°F | Ht <= 58 in | Wt 138.2 lb

## 2015-01-20 DIAGNOSIS — M12511 Traumatic arthropathy, right shoulder: Secondary | ICD-10-CM

## 2015-01-20 DIAGNOSIS — Z23 Encounter for immunization: Secondary | ICD-10-CM

## 2015-01-20 DIAGNOSIS — K219 Gastro-esophageal reflux disease without esophagitis: Secondary | ICD-10-CM | POA: Diagnosis not present

## 2015-01-20 DIAGNOSIS — M75101 Unspecified rotator cuff tear or rupture of right shoulder, not specified as traumatic: Secondary | ICD-10-CM

## 2015-01-20 DIAGNOSIS — M12811 Other specific arthropathies, not elsewhere classified, right shoulder: Secondary | ICD-10-CM

## 2015-01-20 MED ORDER — SUCRALFATE 1 G PO TABS: 1.0000 g | ORAL_TABLET | Freq: Four times a day (QID) | ORAL | Status: DC | PRN

## 2015-01-20 NOTE — Patient Instructions (Signed)
Thank you for coming. Please try Capsaicin Cream which you can get over the counter for the shoulder pain. I also sent a prescription for Carafate to help with her reflux. Please avoid spicy foods, caffeine, NSAIDs like ibuprofen or motrin or Advil which can worsen her epigastric symptoms. Please return to clinic if epigastric symptoms do not improve over the next few weeks.

## 2015-01-20 NOTE — Assessment & Plan Note (Addendum)
Chronic pain with transient/temporary improvement with Percocet, Tylenol, Advil, Lidoderm Cream, and Duloxetine. MRI of shoulder in 10/2012 notes of complete supraspinatus and infraspniatus tendon tears with retraction but no focal atrophy, marked tendinopathy of subscapularis and long head of biceps without definite tear, posterior subluxation of glenohumeral joint, AC osteoarthritis, and subacromial/subdeltoid bursitis. CT cervical spine in 01/2013 showed severe degenerative changes from C5-C7.  - recommended adding capsaicin cream to regimen - recommended avoiding Advil and other NSAIDs as this can worsen epigastric pain and patient also has CKD  - recommended using Miralax daily not PRN while taking Percocet to regulate bowel movements - consider repeating shoulder injection as last injection (at orthopedist) 3 months ago improved symptoms for about 2 months

## 2015-01-20 NOTE — Assessment & Plan Note (Signed)
Symptoms of epigastric pain at night after laying in bed associated with sour taste in mouth and raspy voice in the morning is likely consistent with GERD. No history of PUD. No alarming symptoms of dysphagia or unexplained weight loss. No history of smoking or alcohol use. Symptoms for the past 4-5 months. - continue Nexium 40mg  BID - ordered Carafate four times a day PRN: asked patient to take in the evening as symptoms are mainly at bedtime - recommended avoiding caffeine, spicy foods, and NSAIDs (patient is currently taking Advil for shoulder pain)  - consider H. Pylori testing if symptoms continue

## 2015-01-20 NOTE — Progress Notes (Signed)
Patient ID: VYONNE NARRO, female   DOB: [birth year], 81 y.o.   MRN: [id number] Subjective:   CC: chronic epigastric pain and chronic right shoulder pain   HPI:   Patient is here with daughter who helped provide history.   Epigastric Pain: Patient notes of epigastric pain for the past 4-5 months. Patient describes symptoms as a sharp pain in epigastric region which was initially intermittent and occurred randomly through out the day. Recently symptoms only occur after patient lays down in bed every night; pain self resolves. Pain is not associated with SOB, radiation, diaphoresis, or nausea. Denies regurgitation of stomach contents, but does note of sour taste in her mouth in the morning at times and a raspy voice in the morning. Patient was seen in clinic in 12/2014 for similar symptoms. Her home Nexium 40mg  was increased from daily to BID. Daughter states patient's symptoms have not improved much with this increase in dose. Denies dysphagia, no history of PUD, no weight loss, no history of smoking or alcohol use. Does use Advil regularly for right shoulder pain. Per daughter, patient's cardiologist (Dr. Einar Gip) does not believe symptoms are cardiac related.   Right Shoulder Pain:  Notes this is a chronic issue; which seemed to have worsened in the past year. Pain is mainly in right shoulder and extends to about the mid bicep. No radiation of pain down to hand, however notes of numbness in right hand which is chronic and stable. Patient does not use the right arm as much due to pain. Patient is currently taking Percocet, Tylenol, Advil, Lidoderm Cream, Voltaren gel, and Duloxetine for symptoms. Daughter notes she received a shot in her right shoulder by her orthopedist which improved her symptoms for about 2 months. Patient was initially evaluated for surgery but this was held due to patient developing heart block which required placement of pacemaker. MRI of shoulder in 10/2012 notes of complete supraspinatus  and infraspniatus tendon tears with retraction but no focal atrophy, marked tendinopathy of subscapularis and long head of biceps without definite tear, posterior subluxation of glenohumeral joint, AC osteoarthritis, and subacromial/subdeltoid bursitis. CT cervical spine in 01/2013 showed severe degenerative changes from C5-C7.   Review of Systems - Per HPI.   PMH, FH, or SH Smoking status: never smoker    Objective:  Physical Exam BP 138/68 mmHg  Pulse 75  Temp(Src) 98.5 F (36.9 C) (Oral)  Ht 4\' 9"  (1.448 m)  Wt 138 lb 4 oz (62.71 kg)  BMI 29.91 kg/m2 GEN: seems to be in some discomfort  CV: RRR, no murmurs, rubs, or gallops; pacemaker under skin on upper left chest PULM: CTAB, normal effort ABD: Soft, nontender, nondistended, NABS, no organomegaly, ventral hernia reducible SKIN: No rash or cyanosis; warm and well-perfused Shoulder: no asymmetry noted. Right shoulder: Mildly tender to palpation at [facility] joint and near mid triceps. Active ROM normal in Left arm; Active ROM limited in right arm due to pain: only able to minimally flex arm. Active ROM limited to pain in right arm; normal on left. Sensation to light touch diminished in left upper arm and hand compared to right arm.     Assessment:     SULEYMA MOSER is a 81 y.o. female here for GERD and Right shoulder pain    Plan:     # See problem list and after visit summary for problem-specific plans.   Follow-up: Follow up PRN   Smiley Houseman, MD [place]

## 2015-01-23 ENCOUNTER — Other Ambulatory Visit: Payer: Self-pay | Admitting: Family Medicine

## 2015-02-10 DIAGNOSIS — M19011 Primary osteoarthritis, right shoulder: Secondary | ICD-10-CM | POA: Diagnosis not present

## 2015-02-10 DIAGNOSIS — S52552D Other extraarticular fracture of lower end of left radius, subsequent encounter for closed fracture with routine healing: Secondary | ICD-10-CM | POA: Diagnosis not present

## 2015-02-10 DIAGNOSIS — M25311 Other instability, right shoulder: Secondary | ICD-10-CM | POA: Diagnosis not present

## 2015-02-20 ENCOUNTER — Other Ambulatory Visit: Payer: Self-pay | Admitting: Family Medicine

## 2015-02-22 DIAGNOSIS — Z950 Presence of cardiac pacemaker: Secondary | ICD-10-CM | POA: Diagnosis not present

## 2015-02-27 ENCOUNTER — Ambulatory Visit (INDEPENDENT_AMBULATORY_CARE_PROVIDER_SITE_OTHER): Payer: Medicare Other | Admitting: Internal Medicine

## 2015-02-27 ENCOUNTER — Other Ambulatory Visit: Payer: Self-pay | Admitting: Internal Medicine

## 2015-02-27 ENCOUNTER — Other Ambulatory Visit: Payer: Self-pay | Admitting: *Deleted

## 2015-02-27 ENCOUNTER — Encounter: Payer: Self-pay | Admitting: Internal Medicine

## 2015-02-27 VITALS — BP 143/59 | HR 68 | Temp 98.2°F | Wt 139.1 lb

## 2015-02-27 DIAGNOSIS — E785 Hyperlipidemia, unspecified: Secondary | ICD-10-CM

## 2015-02-27 DIAGNOSIS — Z23 Encounter for immunization: Secondary | ICD-10-CM | POA: Diagnosis not present

## 2015-02-27 DIAGNOSIS — N183 Chronic kidney disease, stage 3 unspecified: Secondary | ICD-10-CM

## 2015-02-27 DIAGNOSIS — Z13220 Encounter for screening for lipoid disorders: Secondary | ICD-10-CM | POA: Diagnosis not present

## 2015-02-27 DIAGNOSIS — E038 Other specified hypothyroidism: Secondary | ICD-10-CM | POA: Diagnosis not present

## 2015-02-27 HISTORY — DX: Hyperlipidemia, unspecified: E78.5

## 2015-02-27 LAB — BASIC METABOLIC PANEL WITH GFR
BUN: 27 mg/dL — ABNORMAL HIGH (ref 7–25)
CO2: 31 mmol/L (ref 20–31)
Calcium: 8.9 mg/dL (ref 8.6–10.4)
Chloride: 98 mmol/L (ref 98–110)
Creat: 1.06 mg/dL — ABNORMAL HIGH (ref 0.60–0.88)
GFR, EST AFRICAN AMERICAN: 52 mL/min — AB (ref >=60)
GFR, EST NON AFRICAN AMERICAN: 45 mL/min — AB (ref >=60)
Glucose, Bld: 95 mg/dL (ref 65–99)
POTASSIUM: 4.7 mmol/L (ref 3.5–5.3)
SODIUM: 133 mmol/L — AB (ref 135–146)

## 2015-02-27 LAB — TSH: TSH: 1.296 u[IU]/mL (ref 0.350–4.500)

## 2015-02-27 MED ORDER — TRAZODONE HCL 50 MG PO TABS: 50.0000 mg | ORAL_TABLET | Freq: Every day | ORAL | Status: DC

## 2015-02-27 MED ORDER — LIDOCAINE 5 % EX OINT: TOPICAL_OINTMENT | CUTANEOUS | Status: DC

## 2015-02-27 NOTE — Assessment & Plan Note (Signed)
Instructed patient to avoid NSAIDs for pain management  - BMP with GFR to monitor CKD

## 2015-02-27 NOTE — Patient Instructions (Signed)
Thank you for coming in  - you got your second pneumonia vaccine today - will fill out the prior authorization form and send it in  - i will refill the trazodone and lidocaine cream today

## 2015-02-27 NOTE — Assessment & Plan Note (Signed)
Compliant with medication  - will check TSH; patient notes of fatigue and some hair changes

## 2015-02-27 NOTE — Progress Notes (Signed)
Patient ID: Jordan [name], female   DOB: 09/22/1919, 81 y.o.   MRN: [id number] Subjective:   CC: follow up CKD3, thyroid, and patient's mother desires to check cholesterol. Patient's mother also requests PCP to fill out prior authorization paperwork for Lidocaine cream and Trazodone  HPI:   Hypothyroidism:  - Patient's mother who is her caretaker states patient takes synthroid daily - notes of fatigue, intermittent constipation (chronic), some hair falling, some heat intolerance (chronic); no skin changes  "Heaviness and warm sensation in back of head" - symptoms noted for the past 3 days; notices especially at night. Less during the day because patient's shoulder pain makes her forget about the heaviness in the back of her head - no changes in vision  - notes of HA 2-3 times in the past week; left sided dull pain. This is relieved by Tylenol   Review of Systems - Per HPI.  PMH and medications reviewed  Smoking status: never smoker     Objective:  Physical Exam BP 143/59 mmHg  Pulse 68  Temp(Src) 98.2 F (36.8 C) (Oral)  Wt 139 lb 1 oz (63.078 kg) GEN: NAD HEENT: Atraumatic, normocephalic, neck supple, EOMI, sclera clear  CV: RRR, no murmurs, rubs, or gallops PULM: CTAB, normal effort ABD: Soft, nontender, nondistended, NABS, no organomegaly SKIN: No rash or cyanosis; warm and well-perfused EXTR: No lower extremity edema or calf tenderness NEURO: Awake, alert, no focal deficits grossly, normal speech    Assessment:     Jordan [name] is a 81 y.o. female here for lab work to monitor chronic medical issues.     Plan:     # See problem list and after visit summary for problem-specific plans.  "Heaviness and warm sensation in back of head" - unsure of etiology; no focal neurological deficits  - will continue to monitor   # Will complete prior authorization form and mail/fax  # Health Maintenance: Prevnar vaccine today  Follow-up: Follow up as needed    Smiley Houseman,  MD [place]

## 2015-02-27 NOTE — Assessment & Plan Note (Signed)
Per request of patient's daughter (caretaker) will check lipid panel (future order; last checked in 2014)

## 2015-03-02 NOTE — Telephone Encounter (Signed)
Medication was sent in on 02/27/15 and received by pharmacy. [name],CMA

## 2015-03-05 NOTE — H&P (Signed)
Jordan [name] is an 81 y.o. female.    Chief Complaint: right shoulder pain and weakness  HPI: Pt is a 81 y.o. female complaining of right shoulder pain for multiple years. Pain had continually increased since the beginning. X-rays in the clinic show end-stage arthritic changes of the right shoulder. Pt has tried various conservative treatments which have failed to alleviate their symptoms, including injections and therapy. Various options are discussed with the patient. Risks, benefits and expectations were discussed with the patient. Patient understand the risks, benefits and expectations and wishes to proceed with surgery.   PCP:  Smiley Houseman, MD  D/C Plans: Home  PMH: Past Medical History  Diagnosis Date  . Cervicalgia   . Peripheral neuropathy ([place])   . Hypertension   . Osteoporosis   . Insomnia   . Arthritis   . Headache   . Hypothyroidism   . Right rotator cuff tear   . OA (osteoarthritis)     RIGHT SHOULDER AC JOINT  . Moderate dementia   . Gait instability   . Complete heart block ([place])   . History of colon polyps   . Diverticulosis of colon   . SUI (stress urinary incontinence, female)   . Lumbar stenosis   . Cryptogenic stroke ([place]) S/P  LOOP RECORDER IMPLANT    DX  01/2013  --  SYNCOPAL EPISODE --  EPISODIC  ATRIAL TACHY/ FIBULATION  AND PAUSES  . Frequency of urination   . Chronic constipation   . Glaucoma of both eyes   . Hearing loss of both ears     no hearing aids  . Dry eyes   . Cervicalgia 05/18/2009    Qualifier: Diagnosis of  By: Walker Kehr MD, Patrick Jupiter    . DIVERTICULOSIS OF COLON 06/29/2006    Qualifier: Diagnosis of  By: Benna Dunks    . Combined visual hearing impairment 11/12/2013  . Urinary incontinence, mixed 06/29/2006    Qualifier: Diagnosis of  By: Benna Dunks    . INTERSTITIAL CYSTITIS 09/21/2009    Qualifier: Diagnosis of  By: Sarita Haver  MD, Coralyn Mark    . Syncope and collapse 02/09/2013  . Right knee pain 01/15/2014  . Polymyalgia rheumatica  ([place]) 11/08/2013  . Loss of weight 04/05/2010    Qualifier: Diagnosis of  By: Walker Kehr MD, Patrick Jupiter    . Macular degeneration, age related, nonexudative 04/18/2014  . Hearing loss of aging 05/09/2014    PSH: Past Surgical History  Procedure Laterality Date  . Loop recorder implant  02-08-13; 08-30-13    MDT LinQ implanted by Dr Lovena Le for syncope, explanted 08-30-13 after CHB identified  . Total abdominal hysterectomy w/ bilateral salpingoophorectomy  03-15-2005  . Carpal tunnel release Right   . Eye surgery      cataract, bilat.  . Pacemaker insertion  08-30-2013    MDT ADDRL1 pacemaker implanted by Dr Rayann Heman for CHB  . Loop recorder implant N/A 02/11/2013    Procedure: LOOP RECORDER IMPLANT;  Surgeon: Evans Lance, MD;  Location: [facility] CATH LAB;  Service: Cardiovascular;  Laterality: N/A;  . Permanent pacemaker insertion N/A 08/30/2013    Procedure: PERMANENT PACEMAKER INSERTION;  Surgeon: Coralyn Mark, MD;  Location: [facility] CATH LAB;  Service: Cardiovascular;  Laterality: N/A;    Social History:  reports that she has never smoked. She has never used smokeless tobacco. She reports that she does not drink alcohol or use illicit drugs.  Allergies:  Allergies  Allergen Reactions  . Chlorthalidone Other (See  Comments)    Hyponatremia  . Verapamil Other (See Comments)    Bradycardia - HR  35-45 on 40mg  TID  . Adhesive [Tape] Rash and Other (See Comments)    Skin irritation  . Amlodipine Other (See Comments)    Peripheral edema with 2.5mg  daily dose  . Honey Bee Treatment [Bee Venom] Rash    Rash     Medications: No current facility-administered medications for this encounter.   Current Outpatient Prescriptions  Medication Sig Dispense Refill  . acetaminophen (TYLENOL) 325 MG tablet Take 650 mg by mouth every 6 (six) hours as needed for mild pain.     Marland Kitchen allopurinol (ZYLOPRIM) 100 MG tablet Take 1 tablet (100 mg total) by mouth daily. 30 tablet 12  . beta carotene w/minerals (OCUVITE) tablet  Take 1 tablet by mouth daily. 90 tablet 3  . Calcium Carbonate-Vitamin D (CALTRATE 600+D) 600-400 MG-UNIT per tablet Take 1 tablet by mouth 2 (two) times daily.      . colchicine 0.6 MG tablet Take 0.6 mg by mouth 2 (two) times daily as needed (GOUT).    Marland Kitchen diclofenac sodium (VOLTAREN) 1 % GEL Apply 2 g topically 4 (four) times daily as needed (pain). 100 g 0  . DULoxetine (CYMBALTA) 60 MG capsule take 1 capsule by mouth once daily 30 capsule 1  . esomeprazole (NEXIUM) 40 MG capsule Take 1 capsule (40 mg total) by mouth 2 (two) times daily before a meal. 60 capsule 1  . furosemide (LASIX) 20 MG tablet Take 1 tablet (20 mg total) by mouth daily. (Patient not taking: Reported on 01/20/2015) 30 tablet 12  . gabapentin (NEURONTIN) 100 MG capsule take 2 capsules by mouth three times a day 180 capsule 6  . Glucosamine-Chondroitin-Vit D3 1500-1200-800 MG-MG-UNIT PACK Take 1 tablet by mouth 2 (two) times daily.      Marland Kitchen levothyroxine (SYNTHROID, LEVOTHROID) 100 MCG tablet take 1 tablet by mouth once daily 30 tablet 1  . lidocaine (XYLOCAINE) 5 % ointment apply to affected area once daily if needed DO NOT EXCEED 5GRAM OF OINTMENT 30 g 0  . losartan (COZAAR) 50 MG tablet Take 50 mg by mouth daily. Per cardiologist  0  . ondansetron (ZOFRAN ODT) 4 MG disintegrating tablet Take 1 tablet (4 mg total) by mouth every 8 (eight) hours as needed for nausea or vomiting. (Patient not taking: Reported on 02/27/2015) 30 tablet 0  . oxyCODONE-acetaminophen (PERCOCET/ROXICET) 5-325 MG per tablet Take 1 tablet by mouth every 6 (six) hours as needed for severe pain. 60 tablet 0  . polyethylene glycol (MIRALAX) packet Take 17 g by mouth daily. (Patient taking differently: Take 17 g by mouth daily as needed. ) 100 each 0  . RA ASPIRIN EC 81 MG EC tablet take 1 tablet by mouth once daily 30 tablet 5  . RA COL-RITE 100 MG capsule take 1 capsule by mouth once daily if needed 10 capsule 2  . RESTASIS 0.05 % ophthalmic emulsion Place 1  drop into both eyes daily.     . sucralfate (CARAFATE) 1 G tablet Take 1 tablet (1 g total) by mouth 4 (four) times daily as needed. 30 tablet 0  . TOVIAZ 8 MG TB24 tablet Take 8 mg by mouth daily.   0  . TRAVATAN Z 0.004 % SOLN ophthalmic solution Place 1 drop into both eyes at bedtime.     . traZODone (DESYREL) 50 MG tablet Take 1 tablet (50 mg total) by mouth at bedtime. 30 tablet 3  No results found for this or any previous visit (from the past 48 hour(s)). No results found.  ROS: Pain and weakness with rom of the right upper extremity  Physical Exam:  Alert and oriented 81 y.o. female in no acute distress Cranial nerves 2-12 intact Cervical spine: full rom with no tenderness, nv intact distally Chest: active breath sounds bilaterally, no wheeze rhonchi or rales Heart: regular rate and rhythm, no murmur Abd: non tender non distended with active bowel sounds Hip is stable with rom  Right shoulder with limited rom and strength nv intact distally Moderate crepitus with rom No rashes   Assessment/Plan Assessment: right rotator cuff arthropathy  Plan: Patient will undergo a right reverse total shoulder by Dr. Veverly Fells at [facility]. Risks benefits and expectations were discussed with the patient. Patient understand risks, benefits and expectations and wishes to proceed.

## 2015-03-06 ENCOUNTER — Telehealth: Payer: Self-pay | Admitting: Internal Medicine

## 2015-03-06 NOTE — Telephone Encounter (Signed)
Will forward to Dr. Dallas Schimke. [name],CMA

## 2015-03-06 NOTE — Telephone Encounter (Signed)
Daughter calling for patient pt's lab results from last OV. Please call her at [phone]

## 2015-03-09 ENCOUNTER — Encounter (HOSPITAL_COMMUNITY): Payer: Self-pay

## 2015-03-09 ENCOUNTER — Encounter (HOSPITAL_COMMUNITY)
Admission: RE | Admit: 2015-03-09 | Discharge: 2015-03-09 | Disposition: A | Discharge status: Home / Self Care | Payer: Medicare Other | Source: Ambulatory Visit | Attending: Orthopedic Surgery | Admitting: Orthopedic Surgery

## 2015-03-09 DIAGNOSIS — Z01818 Encounter for other preprocedural examination: Secondary | ICD-10-CM | POA: Diagnosis not present

## 2015-03-09 DIAGNOSIS — M19011 Primary osteoarthritis, right shoulder: Secondary | ICD-10-CM | POA: Diagnosis not present

## 2015-03-09 HISTORY — DX: Presence of cardiac pacemaker: Z95.0

## 2015-03-09 HISTORY — DX: Reserved for inherently not codable concepts without codable children: IMO0001

## 2015-03-09 LAB — CBC
HCT: 38.3 % (ref 36.0–46.0)
Hemoglobin: 12.3 g/dL (ref 12.0–15.0)
MCH: 32.6 pg (ref 26.0–34.0)
MCHC: 32.1 g/dL (ref 30.0–36.0)
MCV: 101.6 fL — AB (ref 78.0–100.0)
PLATELETS: 182 10*3/uL (ref 150–400)
RBC: 3.77 MIL/uL — ABNORMAL LOW (ref 3.87–5.11)
RDW: 13 % (ref 11.5–15.5)
WBC: 7.6 10*3/uL (ref 4.0–10.5)

## 2015-03-09 LAB — SURGICAL PCR SCREEN
MRSA, PCR: NEGATIVE
Staphylococcus aureus: POSITIVE — AB

## 2015-03-09 LAB — BASIC METABOLIC PANEL
Anion gap: 7 (ref 5–15)
BUN: 20 mg/dL (ref 6–20)
CALCIUM: 9.8 mg/dL (ref 8.9–10.3)
CO2: 31 mmol/L (ref 22–32)
Chloride: 99 mmol/L — ABNORMAL LOW (ref 101–111)
Creatinine, Ser: 1.01 mg/dL — ABNORMAL HIGH (ref 0.44–1.00)
GFR calc Af Amer: 54 mL/min — ABNORMAL LOW (ref >60)
GFR, EST NON AFRICAN AMERICAN: 46 mL/min — AB (ref >60)
GLUCOSE: 109 mg/dL — AB (ref 65–99)
Potassium: 4.4 mmol/L (ref 3.5–5.1)
SODIUM: 137 mmol/L (ref 135–145)

## 2015-03-09 NOTE — Progress Notes (Signed)
Pacemaker form faxed to dr Einar Gip. Will f/u with Shelby Dubin . Daughter was with pt and will interpret,spanish.

## 2015-03-09 NOTE — Telephone Encounter (Signed)
Daughter is aware of results and per request a copy was faxed to her.  [phone]. [name],CMA

## 2015-03-09 NOTE — Progress Notes (Signed)
I called a prescription for Mupirocin ointment to [facility], [place], [place]

## 2015-03-09 NOTE — Telephone Encounter (Signed)
Please let patient and her daughter know that her thyroid study was normal. Her electrolytes were stable. It looks like her cholesterol was not checked with the other blood work. Let her know the order is still there so she is able to make a lab appointment to get this done (remind her this should be done fasting). Thank you

## 2015-03-09 NOTE — Pre-Procedure Instructions (Signed)
    [name]  03/09/2015      [id number], [place] [address] Phone: [phone] Fax: [phone]    Your procedure is scheduled on 03/20/15.  Report to [facility] Admitting at 830 A.M.  Call this number if you have problems the morning of surgery:  [phone]   Remember:  Do not eat food or drink liquids after midnight.  Take these medicines the morning of surgery with A SIP OF WATER --tylenol,allopurinol,cymbalta,nexium,synthroid,oxycodone,eye drops   Do not wear jewelry, make-up or nail polish.  Do not wear lotions, powders, or perfumes.  You may wear deodorant.  Do not shave 48 hours prior to surgery.  Men may shave face and neck.  Do not bring valuables to the hospital.  [facility] is not responsible for any belongings or valuables.  Contacts, dentures or bridgework may not be worn into surgery.  Leave your suitcase in the car.  After surgery it may be brought to your room.  For patients admitted to the hospital, discharge time will be determined by your treatment team.  Patients discharged the day of surgery will not be allowed to drive home.   Name and phone number of your driver:   Special instructions:    Please read over the following fact sheets that you were given. Pain Booklet, Coughing and Deep Breathing, MRSA Information and Surgical Site Infection Prevention

## 2015-03-10 NOTE — Progress Notes (Signed)
Anesthesia Chart Review:  Pt is 81 year old female scheduled for R reverse shoulder arthroplasty on 03/20/2015 with Dr. Veverly Fells.   Pt is spanish-speaking only.   Pt receives primary care at [facility]. Cardiologist is Dr. Einar Gip, last office visit 09/25/14 (in media tab).   PMH includes:  HTN, complete heart block, pacemaker, stroke, moderate dementia, hypothyroidism, glaucoma, syncope (2014). Hearing loss. Never smoker. BMI 35.   Medications include: nexium, lasix, levothyroxine, losartan, ASA.   Preoperative labs reviewed.    Chest x-ray 07/29/14 reviewed. Stable cardiomegaly with permanent pacemaker. No active lung disease.   EKG 07/03/14: sinus rhythm with 1st degree AV block. Marked ST abnormality, possible lateral subendocardial injury  Echo 07/29/14: - Left ventricle: The cavity size was normal. There was mild concentric hypertrophy. Systolic function was normal. The estimated ejection fraction was in the range of 60% to 65%. Although no diagnostic regional wall motion abnormality was identified, this possibility cannot be completely excluded on the basis of this study. Doppler parameters are consistent with abnormal left ventricular relaxation (grade 1 diastolic dysfunction). - Mitral valve: Calcified annulus.  Dr. Einar Gip cleared pt for shoulder surgery in note dated 09/25/14 (found in media tab).   Perioperative pacemaker prescription form pending from Dr. Irven Shelling office.   If no changes, I anticipate pt can proceed with surgery as scheduled.   Willeen Cass, FNP-BC [facility]/Anesthesiology Phone: [phone] 03/10/2015 2:59 PM

## 2015-03-12 ENCOUNTER — Other Ambulatory Visit (HOSPITAL_COMMUNITY): Payer: Medicare Other

## 2015-03-12 DIAGNOSIS — H542 Low vision, both eyes: Secondary | ICD-10-CM | POA: Diagnosis not present

## 2015-03-12 DIAGNOSIS — H3530 Unspecified macular degeneration: Secondary | ICD-10-CM | POA: Diagnosis not present

## 2015-03-13 ENCOUNTER — Other Ambulatory Visit: Payer: Self-pay | Admitting: Internal Medicine

## 2015-03-15 ENCOUNTER — Emergency Department (HOSPITAL_COMMUNITY)
Admission: EM | Admit: 2015-03-15 | Discharge: 2015-03-16 | Disposition: A | Discharge status: Home / Self Care | Payer: Medicare Other | Attending: Emergency Medicine | Admitting: Emergency Medicine

## 2015-03-15 ENCOUNTER — Emergency Department (HOSPITAL_COMMUNITY): Payer: Medicare Other

## 2015-03-15 ENCOUNTER — Encounter (HOSPITAL_COMMUNITY): Payer: Self-pay | Admitting: *Deleted

## 2015-03-15 DIAGNOSIS — Z79899 Other long term (current) drug therapy: Secondary | ICD-10-CM | POA: Diagnosis not present

## 2015-03-15 DIAGNOSIS — W010XXA Fall on same level from slipping, tripping and stumbling without subsequent striking against object, initial encounter: Secondary | ICD-10-CM | POA: Diagnosis not present

## 2015-03-15 DIAGNOSIS — Z950 Presence of cardiac pacemaker: Secondary | ICD-10-CM | POA: Diagnosis not present

## 2015-03-15 DIAGNOSIS — M79662 Pain in left lower leg: Secondary | ICD-10-CM | POA: Diagnosis not present

## 2015-03-15 DIAGNOSIS — R52 Pain, unspecified: Secondary | ICD-10-CM

## 2015-03-15 DIAGNOSIS — Y9389 Activity, other specified: Secondary | ICD-10-CM | POA: Insufficient documentation

## 2015-03-15 DIAGNOSIS — S20212A Contusion of left front wall of thorax, initial encounter: Secondary | ICD-10-CM | POA: Insufficient documentation

## 2015-03-15 DIAGNOSIS — S8992XA Unspecified injury of left lower leg, initial encounter: Secondary | ICD-10-CM | POA: Diagnosis not present

## 2015-03-15 DIAGNOSIS — W19XXXA Unspecified fall, initial encounter: Secondary | ICD-10-CM

## 2015-03-15 DIAGNOSIS — Y998 Other external cause status: Secondary | ICD-10-CM | POA: Diagnosis not present

## 2015-03-15 DIAGNOSIS — Y9289 Other specified places as the place of occurrence of the external cause: Secondary | ICD-10-CM | POA: Insufficient documentation

## 2015-03-15 DIAGNOSIS — F0390 Unspecified dementia without behavioral disturbance: Secondary | ICD-10-CM | POA: Insufficient documentation

## 2015-03-15 DIAGNOSIS — R0781 Pleurodynia: Secondary | ICD-10-CM | POA: Diagnosis not present

## 2015-03-15 DIAGNOSIS — K5900 Constipation, unspecified: Secondary | ICD-10-CM | POA: Diagnosis not present

## 2015-03-15 DIAGNOSIS — M79603 Pain in arm, unspecified: Secondary | ICD-10-CM | POA: Diagnosis not present

## 2015-03-15 DIAGNOSIS — T148 Other injury of unspecified body region: Secondary | ICD-10-CM | POA: Diagnosis not present

## 2015-03-15 DIAGNOSIS — S299XXA Unspecified injury of thorax, initial encounter: Secondary | ICD-10-CM | POA: Diagnosis present

## 2015-03-15 HISTORY — DX: Constipation, unspecified: K59.00

## 2015-03-15 HISTORY — DX: Presence of cardiac pacemaker: Z95.0

## 2015-03-15 MED ORDER — OXYCODONE-ACETAMINOPHEN 5-325 MG PO TABS
1.0000 | ORAL_TABLET | Freq: Once | ORAL | Status: AC
  Administered 2015-03-16: 1 via ORAL
  Filled 2015-03-15: qty 1

## 2015-03-15 NOTE — ED Notes (Signed)
Bed: [id number] Expected date:  Expected time:  Means of arrival:  Comments: EMS 81 yo female fall, flank pain left side

## 2015-03-15 NOTE — ED Notes (Signed)
EMS called to home.  Found patient in chair post fall.  Family states that the patient fell on her back after a slip/trip fall. Patient denies LOC or hitting her head with no neck or back pain.  Patient does complain of left leg pain.  No deformity  Or bleeding noted.

## 2015-03-15 NOTE — ED Provider Notes (Signed)
CSN: [id number]     Arrival date & time 03/15/15  2301 History   First MD Initiated Contact with Patient 03/15/15 2326     Chief Complaint  Patient presents with  . Fall     (Consider location/radiation/quality/duration/timing/severity/associated sxs/prior Treatment) Patient is a 81 y.o. female presenting with fall. The history is provided by the patient and a relative. No language interpreter was used.  Fall Pertinent negatives include no nausea or vomiting.  Ms. Holeman is a 81 y.o female with a history of dementia, right rotator cuff tear and tendinitis and pacemaker who presents for left rib pain and left lower leg pain after trip over her right foot and fall onto the ground. Her son-in-law states he was in the other room when this happened but was able to witness that she was using her walker to go to the bathroom, tripped over her right foot, and landed on her buttocks and then on her left side. They state that she fell on the carpet and did not hit any objects. Patient and family deny that she hit her head or that she is on any anticoagulation medication this time. They state she normally takes aspirin but has not taken it today. She denies passing out. She had no treatment for pain prior to arrival. Her daughter was able to translate for her. She is scheduled to have right shoulder surgery next week. She takes Percocet as needed for chronic right shoulder pain. Her daughter states she only gives it to her on occasion. She ambulates with walker at baseline. Past Medical History  Diagnosis Date  . Pacemaker   . Constipation    No past surgical history on file. No family history on file. Social History  Substance Use Topics  . Smoking status: Not on file  . Smokeless tobacco: Not on file  . Alcohol Use: Not on file   OB History    No data available     Review of Systems  Gastrointestinal: Negative for nausea and vomiting.  All other systems reviewed and are  negative.     Allergies  Honey bee treatment  Home Medications   Prior to Admission medications   Medication Sig Start Date End Date Taking? Authorizing Provider  acetaminophen (TYLENOL) 325 MG tablet Take 650 mg by mouth every 6 (six) hours as needed.   Yes Historical Provider, MD  allopurinol (ZYLOPRIM) 100 MG tablet Take 100 mg by mouth daily.   Yes Historical Provider, MD  colchicine 0.6 MG tablet Take 0.6 mg by mouth 2 (two) times daily.    Yes Historical Provider, MD  cycloSPORINE (RESTASIS) 0.05 % ophthalmic emulsion Place 1 drop into both eyes at bedtime.   Yes Historical Provider, MD  DULoxetine (CYMBALTA) 60 MG capsule Take 60 mg by mouth daily.   Yes Historical Provider, MD  esomeprazole (NEXIUM) 40 MG capsule Take 40 mg by mouth 2 (two) times daily before a meal.   Yes Historical Provider, MD  fesoterodine (TOVIAZ) 8 MG TB24 tablet Take 8 mg by mouth daily.   Yes Historical Provider, MD  furosemide (LASIX) 40 MG tablet Take 40 mg by mouth daily as needed for fluid.    Yes Historical Provider, MD  gabapentin (NEURONTIN) 100 MG capsule Take 200 mg by mouth 2 (two) times daily.    Yes Historical Provider, MD  ibuprofen (ADVIL,MOTRIN) 100 MG chewable tablet Chew 100-200 mg by mouth every 8 (eight) hours as needed (for pain.).    Yes Historical Provider, MD  levothyroxine (SYNTHROID, LEVOTHROID) 100 MCG tablet Take 100 mcg by mouth daily before breakfast.   Yes Historical Provider, MD  losartan (COZAAR) 100 MG tablet Take 100 mg by mouth daily.   Yes Historical Provider, MD  losartan (COZAAR) 50 MG tablet Take 50 mg by mouth daily.   Yes Historical Provider, MD  RA ASPIRIN EC 81 MG EC tablet Take 81 mg by mouth daily. 03/10/15  Yes Historical Provider, MD  RA COL-RITE 100 MG capsule Take 100 mg by mouth daily as needed. For constipation 03/10/15  Yes Historical Provider, MD  sucralfate (CARAFATE) 1 G tablet Take 1 g by mouth 4 (four) times daily as needed. After meals when needed  01/20/15  Yes Historical Provider, MD  travoprost, benzalkonium, (TRAVATAN) 0.004 % ophthalmic solution Place 1 drop into both eyes at bedtime.   Yes Historical Provider, MD  traZODone (DESYREL) 50 MG tablet Take 50 mg by mouth at bedtime. 02/27/15  Yes Historical Provider, MD  VIVLODEX 10 MG CAPS Take 10 mg by mouth daily as needed. For pain. 12/16/14  Yes Historical Provider, MD  oxyCODONE-acetaminophen (PERCOCET/ROXICET) 5-325 MG tablet Take 2 tablets by mouth every 4 (four) hours as needed for severe pain. 03/16/15   Kenedi Cilia Patel-Mills, PA-C   BP 164/57 mmHg  Pulse 82  Temp(Src) 98.7 F (37.1 C) (Oral)  Resp 18  SpO2 94% Physical Exam  Constitutional: She is oriented to person, place, and time. She appears well-developed and well-nourished. No distress.  HENT:  Head: Normocephalic and atraumatic.  Eyes: Conjunctivae are normal.  Neck: Normal range of motion. Neck supple.  Full range of motion of her neck.  Cardiovascular: Normal rate, regular rhythm and normal heart sounds.   Pulmonary/Chest: Effort normal and breath sounds normal. No respiratory distress. She has no wheezes. She has no rales. She exhibits tenderness.  Reproducible left chest wall tenderness underneath the breasts. No ecchymosis, obvious deformity, or skin tenting.  Abdominal: Soft. She exhibits no distension. There is no tenderness. There is no rebound and no guarding.  No abdominal tenderness to palpation. No guarding or rebound. Obese.  Musculoskeletal: Normal range of motion.  Left wrist is in a Velcro splint from previous fall with wrist fracture. Good grip strength.   Left lateral lower extremity tenderness along the fibula. No deformity.  Neurological: She is alert and oriented to person, place, and time.  Skin: Skin is warm and dry.  Nursing note and vitals reviewed.   ED Course  Procedures (including critical care time) Labs Review Labs Reviewed - No data to display  Imaging Review Dg Ribs Unilateral  Left  03/16/2015  CLINICAL DATA:  Diffuse left-sided rib pain after fall today fall walking to the bathroom with her walker. EXAM: LEFT RIBS - 2 VIEW COMPARISON:  None. FINDINGS: The cortical margins of the left ribs are intact. No fracture or destructive rib lesion. Mild cardiomegaly with atherosclerosis of the thoracic aorta. No pulmonary edema, pleural effusion, pneumothorax or confluent airspace disease. Advanced degenerative change of the right shoulder. Scoliosis in the thoracic spine with associated degenerative change. IMPRESSION: No evidence of left rib fracture. Electronically Signed   By: Jeb Levering M.D.   On: 03/16/2015 00:24   Dg Tibia/fibula Left  03/16/2015  CLINICAL DATA:  Left lower leg pain. Fall today while walking to the bathroom with her walker. EXAM: LEFT TIBIA AND FIBULA - 2 VIEW COMPARISON:  None. FINDINGS: Cortical margins of the tibia and fibula are intact. There is no fracture. Knee and  ankle alignment are maintained. The bones are under mineralized. There are vascular calcifications. No focal soft tissue abnormality. IMPRESSION: No fracture or dislocation of the left lower leg. Electronically Signed   By: Jeb Levering M.D.   On: 03/16/2015 00:21   I have personally reviewed and evaluated these image results as part of my medical decision-making.   EKG Interpretation None      MDM   Final diagnoses:  Pain  Fall, initial encounter  Rib contusion, left, initial encounter  Patient presents after trip over right foot and falling on her buttocks and back. No head injury or loss of consciousness. No anticoagulation medication. Her vitals are stable and she appears to be in pain. Xray of the left ribs and left lower extremity are negative for acute fracture or dislocation. I believe this is most likely contusion from fall.  I discussed this patient with Dr. Roxanne Mins who has seen and evaluated the patient.  I explained results to her daughter who was able to translate.   I also prescribed percocet since the daughter says she does not have many left from her chronic right shoulder pain.   Medications  oxyCODONE-acetaminophen (PERCOCET/ROXICET) 5-325 MG per tablet 1 tablet (1 tablet Oral Given 03/16/15 0038)   Filed Vitals:   03/15/15 2306  BP: 164/57  Pulse: 82  Temp: 98.7 F (37.1 C)  Resp: [address], PA-C [phone]  Delora Fuel, MD [phone] [id number]

## 2015-03-16 ENCOUNTER — Other Ambulatory Visit: Payer: Self-pay | Admitting: Internal Medicine

## 2015-03-16 ENCOUNTER — Other Ambulatory Visit: Payer: Self-pay | Admitting: Family Medicine

## 2015-03-16 ENCOUNTER — Encounter (HOSPITAL_COMMUNITY): Payer: Self-pay

## 2015-03-16 DIAGNOSIS — S20212A Contusion of left front wall of thorax, initial encounter: Secondary | ICD-10-CM | POA: Diagnosis not present

## 2015-03-16 DIAGNOSIS — R0781 Pleurodynia: Secondary | ICD-10-CM | POA: Diagnosis not present

## 2015-03-16 DIAGNOSIS — S299XXA Unspecified injury of thorax, initial encounter: Secondary | ICD-10-CM | POA: Diagnosis not present

## 2015-03-16 DIAGNOSIS — S8992XA Unspecified injury of left lower leg, initial encounter: Secondary | ICD-10-CM | POA: Diagnosis not present

## 2015-03-16 DIAGNOSIS — M79662 Pain in left lower leg: Secondary | ICD-10-CM | POA: Diagnosis not present

## 2015-03-16 MED ORDER — OXYCODONE-ACETAMINOPHEN 5-325 MG PO TABS: 2.0000 | ORAL_TABLET | ORAL | Status: DC | PRN

## 2015-03-16 NOTE — Telephone Encounter (Signed)
Dr. Caryl Bis is in a new office, please advise refill.  Thanks, Lavella Lemons, RN

## 2015-03-16 NOTE — Discharge Instructions (Signed)
Contusin en el trax  (Chest Contusion)  Una contusin es un hematoma profundo. Las contusiones ocurren cuando una lesin provoca un sangrado debajo de la piel. Los signos de hematoma son dolor, inflamacin (hinchazn) y cambio de color en la piel. La zona de la contusin puede ponerse [place], [place] o [place].  CUIDADOS EN EL HOGAR   Aplique hielo sobre la zona lesionada.  Ponga el hielo en una bolsa plstica.  Colquese una toalla entre la piel y la bolsa de hielo.  Deje el hielo durante 15 a 20 minutos por vez 3 a 4 veces por da, durante las primeras 48 horas.  Slo debe tomar la medicacin segn las indicaciones del mdico.  Haga reposo.  Respire profundamente (ejercicios de respiracin profunda) segn lo indicado por su mdico.  Si fuma, abandone el hbito.  No levante objetos de ms de 5 libras (2.3 kg) durante 3 das o ms si se lo indica su mdico. SOLICITE AYUDA DE INMEDIATO SI:   Tiene ms moretones o hinchazn.  Siente un dolor que [place].  Tiene dificultad para respirar.  Se siente mareado, dbil o se desmaya (se desvanece).  Observa sangre en el pis (orina) o en la materia fecal (heces).  Tose o vomita sangre.  La hinchazn o el dolor no se [employer]. ASEGRESE DE QUE:   Comprende estas instrucciones.  Controlar su enfermedad.  Solicitar ayuda de inmediato si no mejora o si empeora.   Esta informacin no tiene Marine scientist el consejo del mdico. Asegrese de hacerle al mdico cualquier pregunta que tenga.   Document Released: 01/11/2012 Elsevier Interactive Patient Education 2016 [place] msculoesqueltico (Musculoskeletal Pain) El dolor musculoesqueltico se siente en huesos y msculos. El dolor puede ocurrir en cualquier parte del cuerpo. El profesional que lo asiste podr tratarlo sin Pharmacist, community causa del dolor. Lo tratar [employer] de laboratorio (sangre y [place]), las radiografas y [place]. La causa de estos dolores puede ser un virus.  CAUSAS Generalmente no existe una causa definida para este trastorno. Tambin el [employer] [place] [employer] puede deberse a la [place]. En la actividad excesiva se incluye el hacer ejercicios fsicos muy intensos cuando no se est en buena forma. El dolor de huesos tambin puede deberse a cambios climticos. Los huesos son sensibles a los cambios en la presin atmosfrica. [place]  Para proteger su privacidad, no se entregarn los [employer] pruebas por telfono. Asegrese de conseguirlos. Consulte el modo en que podr obtenerlos si no se lo han informado. Es su responsabilidad contar con los [employer].  Utilice los medicamentos de venta libre o de prescripcin para Conservation officer, historic buildings, Health and safety inspector o la [place], segn se lo indique el profesional que lo asiste. Si le han administrado medicamentos, no conduzca, no opere maquinarias ni Teacher, adult education, y tampoco firme documentos legales durante 24 horas. No beba alcohol. No tome pldoras para dormir ni otros medicamentos que Animal nutritionist.  Podr seguir con todas las actividades a menos que stas le ocasionen ms [employer]. Cuando el dolor disminuya, es importante que gradualmente reanude toda la rutina habitual. Retome las actividades comenzando lentamente. Aumente gradualmente la intensidad y la duracin de sus actividades o del ejercicio.  Durante los perodos de dolor intenso, el reposo en cama puede ser beneficioso. Recustese o sintese en la posicin que le sea ms cmoda.  Coloque hielo sobre la zona afectada.  Ponga hielo en [place]  bolsa.  Colquese una toalla entre la piel y la bolsa de hielo.  Aplique el hielo durante 10 a 20 minutos 3  4 veces por da.  Si el dolor empeora, o no desaparece puede ser [employer] repetir las pruebas o Optometrist nuevos exmenes. El profesional que lo asiste podr requerir  investigar ms profundamente para Animator causa posible. SOLICITE ATENCIN MDICA DE INMEDIATO SI:  Siente que el dolor empeora y no se alivia con los medicamentos.  Siente dolor en el pecho asociado a falta de aire, sudoracin, nuseas o vmitos.  El dolor se localiza en el abdomen.  Comienza a sentir nuevos sntomas que parecen ser diferentes o que lo preocupan. ASEGRESE DE QUE:   Comprende las instrucciones para el alta mdica.  Controlar su enfermedad.  Solicitar atencin mdica de inmediato segn las indicaciones.   Esta informacin no tiene Marine scientist el consejo del mdico. Asegrese de hacerle al mdico cualquier pregunta que tenga.   Document Released: 01/26/2005 Document Revised: 07/11/2011 Elsevier Interactive Patient Education [employer].

## 2015-03-17 ENCOUNTER — Telehealth: Payer: Self-pay | Admitting: *Deleted

## 2015-03-17 NOTE — Telephone Encounter (Signed)
-----   Message from Smiley Houseman, MD sent at 03/16/2015  5:39 PM EST ----- Regarding: refill request Please call patient's daughter to let her know that I have a paper prescription for Percocet for Jordan [name]; this was a refill request I got in my inbox. Thanks

## 2015-03-17 NOTE — Telephone Encounter (Signed)
Spoke to daughter Stasia Cavalier and she is aware that script is ready for pick up and her duloxetine was sent to the pharmacy.  Jansel Vonstein,CMA

## 2015-03-19 MED ORDER — CEFAZOLIN SODIUM-DEXTROSE 2-3 GM-% IV SOLR
2.0000 g | INTRAVENOUS | Status: AC
  Administered 2015-03-20: 2 g via INTRAVENOUS
  Filled 2015-03-19: qty 50

## 2015-03-19 MED ORDER — CHLORHEXIDINE GLUCONATE 4 % EX LIQD: 60.0000 mL | Freq: Once | CUTANEOUS | Status: DC

## 2015-03-20 ENCOUNTER — Inpatient Hospital Stay (HOSPITAL_COMMUNITY): Payer: Medicare Other | Admitting: Certified Registered Nurse Anesthetist

## 2015-03-20 ENCOUNTER — Inpatient Hospital Stay (HOSPITAL_COMMUNITY): Payer: Medicare Other

## 2015-03-20 ENCOUNTER — Inpatient Hospital Stay (HOSPITAL_COMMUNITY): Payer: Medicare Other | Admitting: Emergency Medicine

## 2015-03-20 ENCOUNTER — Encounter (HOSPITAL_COMMUNITY)
Admission: RE | Disposition: A | Discharge status: Home Health | Payer: Self-pay | Source: Ambulatory Visit | Attending: Orthopedic Surgery

## 2015-03-20 ENCOUNTER — Encounter (HOSPITAL_COMMUNITY): Payer: Self-pay | Admitting: Anesthesiology

## 2015-03-20 ENCOUNTER — Inpatient Hospital Stay (HOSPITAL_COMMUNITY)
Admission: RE | Admit: 2015-03-20 | Discharge: 2015-03-24 | DRG: 483 | Disposition: A | Discharge status: Home Health | Payer: Medicare Other | Source: Ambulatory Visit | Attending: Orthopedic Surgery | Admitting: Orthopedic Surgery

## 2015-03-20 DIAGNOSIS — Z91030 Bee allergy status: Secondary | ICD-10-CM | POA: Diagnosis not present

## 2015-03-20 DIAGNOSIS — Z950 Presence of cardiac pacemaker: Secondary | ICD-10-CM

## 2015-03-20 DIAGNOSIS — Z9842 Cataract extraction status, left eye: Secondary | ICD-10-CM

## 2015-03-20 DIAGNOSIS — Z79899 Other long term (current) drug therapy: Secondary | ICD-10-CM

## 2015-03-20 DIAGNOSIS — H353190 Nonexudative age-related macular degeneration, unspecified eye, stage unspecified: Secondary | ICD-10-CM | POA: Diagnosis present

## 2015-03-20 DIAGNOSIS — Z96611 Presence of right artificial shoulder joint: Secondary | ICD-10-CM | POA: Diagnosis not present

## 2015-03-20 DIAGNOSIS — Z8673 Personal history of transient ischemic attack (TIA), and cerebral infarction without residual deficits: Secondary | ICD-10-CM | POA: Diagnosis not present

## 2015-03-20 DIAGNOSIS — M19011 Primary osteoarthritis, right shoulder: Secondary | ICD-10-CM | POA: Diagnosis not present

## 2015-03-20 DIAGNOSIS — Z96619 Presence of unspecified artificial shoulder joint: Secondary | ICD-10-CM

## 2015-03-20 DIAGNOSIS — M810 Age-related osteoporosis without current pathological fracture: Secondary | ICD-10-CM | POA: Diagnosis present

## 2015-03-20 DIAGNOSIS — F0390 Unspecified dementia without behavioral disturbance: Secondary | ICD-10-CM | POA: Diagnosis not present

## 2015-03-20 DIAGNOSIS — Z7982 Long term (current) use of aspirin: Secondary | ICD-10-CM | POA: Diagnosis not present

## 2015-03-20 DIAGNOSIS — M542 Cervicalgia: Secondary | ICD-10-CM | POA: Diagnosis not present

## 2015-03-20 DIAGNOSIS — I10 Essential (primary) hypertension: Secondary | ICD-10-CM | POA: Diagnosis present

## 2015-03-20 DIAGNOSIS — R52 Pain, unspecified: Secondary | ICD-10-CM

## 2015-03-20 DIAGNOSIS — Z9841 Cataract extraction status, right eye: Secondary | ICD-10-CM

## 2015-03-20 DIAGNOSIS — T1490XA Injury, unspecified, initial encounter: Secondary | ICD-10-CM

## 2015-03-20 DIAGNOSIS — G8918 Other acute postprocedural pain: Secondary | ICD-10-CM | POA: Diagnosis not present

## 2015-03-20 DIAGNOSIS — Z888 Allergy status to other drugs, medicaments and biological substances status: Secondary | ICD-10-CM | POA: Diagnosis not present

## 2015-03-20 DIAGNOSIS — R3915 Urgency of urination: Secondary | ICD-10-CM | POA: Diagnosis not present

## 2015-03-20 DIAGNOSIS — I959 Hypotension, unspecified: Secondary | ICD-10-CM | POA: Diagnosis not present

## 2015-03-20 DIAGNOSIS — Z90710 Acquired absence of both cervix and uterus: Secondary | ICD-10-CM | POA: Diagnosis not present

## 2015-03-20 DIAGNOSIS — E039 Hypothyroidism, unspecified: Secondary | ICD-10-CM | POA: Diagnosis present

## 2015-03-20 DIAGNOSIS — S4991XA Unspecified injury of right shoulder and upper arm, initial encounter: Secondary | ICD-10-CM | POA: Diagnosis not present

## 2015-03-20 DIAGNOSIS — Z471 Aftercare following joint replacement surgery: Secondary | ICD-10-CM | POA: Diagnosis not present

## 2015-03-20 DIAGNOSIS — H9190 Unspecified hearing loss, unspecified ear: Secondary | ICD-10-CM | POA: Diagnosis present

## 2015-03-20 DIAGNOSIS — H409 Unspecified glaucoma: Secondary | ICD-10-CM | POA: Diagnosis present

## 2015-03-20 DIAGNOSIS — S299XXA Unspecified injury of thorax, initial encounter: Secondary | ICD-10-CM | POA: Diagnosis not present

## 2015-03-20 DIAGNOSIS — M25511 Pain in right shoulder: Secondary | ICD-10-CM | POA: Diagnosis not present

## 2015-03-20 DIAGNOSIS — M353 Polymyalgia rheumatica: Secondary | ICD-10-CM | POA: Diagnosis present

## 2015-03-20 DIAGNOSIS — Z91048 Other nonmedicinal substance allergy status: Secondary | ICD-10-CM | POA: Diagnosis not present

## 2015-03-20 HISTORY — PX: REVERSE SHOULDER ARTHROPLASTY: SHX5054

## 2015-03-20 HISTORY — DX: Presence of unspecified artificial shoulder joint: Z96.619

## 2015-03-20 LAB — ABO/RH: ABO/RH(D): O POS

## 2015-03-20 LAB — TYPE AND SCREEN
ABO/RH(D): O POS
ANTIBODY SCREEN: NEGATIVE

## 2015-03-20 SURGERY — ARTHROPLASTY, SHOULDER, TOTAL, REVERSE
Anesthesia: Regional | Laterality: Right

## 2015-03-20 MED ORDER — TRAVOPROST 0.004 % OP SOLN: 1.0000 [drp] | Freq: Every day | OPHTHALMIC | Status: DC

## 2015-03-20 MED ORDER — BUPIVACAINE-EPINEPHRINE (PF) 0.25% -1:200000 IJ SOLN
INTRAMUSCULAR | Status: AC
  Filled 2015-03-20: qty 30

## 2015-03-20 MED ORDER — OXYCODONE-ACETAMINOPHEN 5-325 MG PO TABS: 1.0000 | ORAL_TABLET | ORAL | Status: DC | PRN

## 2015-03-20 MED ORDER — BISACODYL 10 MG RE SUPP
10.0000 mg | Freq: Every day | RECTAL | Status: DC | PRN
  Administered 2015-03-21: 10 mg via RECTAL
  Filled 2015-03-20: qty 1

## 2015-03-20 MED ORDER — NEOSTIGMINE METHYLSULFATE 10 MG/10ML IV SOLN
INTRAVENOUS | Status: DC | PRN
  Administered 2015-03-20: 3 mg via INTRAVENOUS

## 2015-03-20 MED ORDER — 0.9 % SODIUM CHLORIDE (POUR BTL) OPTIME
TOPICAL | Status: DC | PRN
  Administered 2015-03-20: 1000 mL

## 2015-03-20 MED ORDER — MORPHINE SULFATE (PF) 2 MG/ML IV SOLN
2.0000 mg | INTRAVENOUS | Status: DC | PRN
  Administered 2015-03-20 (×2): 2 mg via INTRAVENOUS
  Filled 2015-03-20 (×2): qty 1

## 2015-03-20 MED ORDER — LIDOCAINE HCL (CARDIAC) 20 MG/ML IV SOLN
INTRAVENOUS | Status: DC | PRN
  Administered 2015-03-20: 100 mg via INTRAVENOUS

## 2015-03-20 MED ORDER — COLCHICINE 0.6 MG PO TABS: 0.6000 mg | ORAL_TABLET | Freq: Two times a day (BID) | ORAL | Status: DC | PRN

## 2015-03-20 MED ORDER — LIDOCAINE HCL 4 % MT SOLN
OROMUCOSAL | Status: DC | PRN
  Administered 2015-03-20: 4 mL via TOPICAL

## 2015-03-20 MED ORDER — ONDANSETRON HCL 4 MG/2ML IJ SOLN
INTRAMUSCULAR | Status: AC
  Filled 2015-03-20: qty 2

## 2015-03-20 MED ORDER — ACETAMINOPHEN 650 MG RE SUPP: 650.0000 mg | Freq: Four times a day (QID) | RECTAL | Status: DC | PRN

## 2015-03-20 MED ORDER — GABAPENTIN 100 MG PO CAPS: 200.0000 mg | ORAL_CAPSULE | Freq: Three times a day (TID) | ORAL | Status: DC

## 2015-03-20 MED ORDER — SUCRALFATE 1 G PO TABS: 1.0000 g | ORAL_TABLET | Freq: Four times a day (QID) | ORAL | Status: DC | PRN

## 2015-03-20 MED ORDER — GABAPENTIN 100 MG PO CAPS
200.0000 mg | ORAL_CAPSULE | Freq: Two times a day (BID) | ORAL | Status: DC
  Administered 2015-03-20 – 2015-03-24 (×7): 200 mg via ORAL
  Filled 2015-03-20 (×7): qty 2

## 2015-03-20 MED ORDER — PHENOL 1.4 % MT LIQD: 1.0000 | OROMUCOSAL | Status: DC | PRN

## 2015-03-20 MED ORDER — ONDANSETRON HCL 4 MG PO TABS: 4.0000 mg | ORAL_TABLET | Freq: Four times a day (QID) | ORAL | Status: DC | PRN

## 2015-03-20 MED ORDER — FUROSEMIDE 20 MG PO TABS: 20.0000 mg | ORAL_TABLET | Freq: Every day | ORAL | Status: DC

## 2015-03-20 MED ORDER — GLYCOPYRROLATE 0.2 MG/ML IJ SOLN
INTRAMUSCULAR | Status: AC
  Filled 2015-03-20: qty 2

## 2015-03-20 MED ORDER — ACETAMINOPHEN 325 MG PO TABS: 650.0000 mg | ORAL_TABLET | Freq: Four times a day (QID) | ORAL | Status: DC | PRN

## 2015-03-20 MED ORDER — CEFAZOLIN SODIUM-DEXTROSE 2-3 GM-% IV SOLR: 2.0000 g | Freq: Four times a day (QID) | INTRAVENOUS | Status: DC

## 2015-03-20 MED ORDER — LOSARTAN POTASSIUM 50 MG PO TABS
50.0000 mg | ORAL_TABLET | Freq: Every day | ORAL | Status: DC
  Administered 2015-03-20: 50 mg via ORAL
  Filled 2015-03-20 (×3): qty 1

## 2015-03-20 MED ORDER — POLYETHYLENE GLYCOL 3350 17 G PO PACK
17.0000 g | PACK | Freq: Every day | ORAL | Status: DC
  Administered 2015-03-20 – 2015-03-24 (×5): 17 g via ORAL
  Filled 2015-03-20 (×5): qty 1

## 2015-03-20 MED ORDER — CYCLOSPORINE 0.05 % OP EMUL
1.0000 [drp] | Freq: Every day | OPHTHALMIC | Status: DC
  Administered 2015-03-20 – 2015-03-23 (×4): 1 [drp] via OPHTHALMIC
  Filled 2015-03-20 (×6): qty 1

## 2015-03-20 MED ORDER — LACTATED RINGERS IV SOLN
INTRAVENOUS | Status: DC
  Administered 2015-03-20: 10:00:00 via INTRAVENOUS

## 2015-03-20 MED ORDER — METOCLOPRAMIDE HCL 5 MG/ML IJ SOLN: 5.0000 mg | Freq: Three times a day (TID) | INTRAMUSCULAR | Status: DC | PRN

## 2015-03-20 MED ORDER — ASPIRIN 81 MG PO CHEW
81.0000 mg | CHEWABLE_TABLET | Freq: Every day | ORAL | Status: DC
  Administered 2015-03-20 – 2015-03-24 (×5): 81 mg via ORAL
  Filled 2015-03-20 (×5): qty 1

## 2015-03-20 MED ORDER — ACETAMINOPHEN 325 MG PO TABS
650.0000 mg | ORAL_TABLET | Freq: Four times a day (QID) | ORAL | Status: DC | PRN
  Administered 2015-03-21 – 2015-03-24 (×9): 650 mg via ORAL
  Filled 2015-03-20 (×9): qty 2

## 2015-03-20 MED ORDER — OCUVITE PO TABS
1.0000 | ORAL_TABLET | Freq: Every day | ORAL | Status: DC
  Administered 2015-03-20 – 2015-03-24 (×5): 1 via ORAL
  Filled 2015-03-20 (×6): qty 1

## 2015-03-20 MED ORDER — LEVOTHYROXINE SODIUM 100 MCG PO TABS
100.0000 ug | ORAL_TABLET | Freq: Every day | ORAL | Status: DC
  Administered 2015-03-21 – 2015-03-24 (×4): 100 ug via ORAL
  Filled 2015-03-20 (×4): qty 1

## 2015-03-20 MED ORDER — LIDOCAINE 5 % EX OINT: TOPICAL_OINTMENT | Freq: Every day | CUTANEOUS | Status: DC | PRN

## 2015-03-20 MED ORDER — ONDANSETRON HCL 4 MG/2ML IJ SOLN: 4.0000 mg | Freq: Four times a day (QID) | INTRAMUSCULAR | Status: DC | PRN

## 2015-03-20 MED ORDER — ROCURONIUM BROMIDE 100 MG/10ML IV SOLN
INTRAVENOUS | Status: DC | PRN
  Administered 2015-03-20: 15 mg via INTRAVENOUS

## 2015-03-20 MED ORDER — CEFAZOLIN SODIUM-DEXTROSE 2-3 GM-% IV SOLR
2.0000 g | Freq: Two times a day (BID) | INTRAVENOUS | Status: AC
  Administered 2015-03-21 (×2): 2 g via INTRAVENOUS
  Filled 2015-03-20 (×2): qty 50

## 2015-03-20 MED ORDER — FESOTERODINE FUMARATE ER 8 MG PO TB24
8.0000 mg | ORAL_TABLET | Freq: Every day | ORAL | Status: DC
  Administered 2015-03-20 – 2015-03-24 (×5): 8 mg via ORAL
  Filled 2015-03-20 (×6): qty 1

## 2015-03-20 MED ORDER — FENTANYL CITRATE (PF) 100 MCG/2ML IJ SOLN
50.0000 ug | Freq: Once | INTRAMUSCULAR | Status: AC
  Administered 2015-03-20: 50 ug via INTRAVENOUS

## 2015-03-20 MED ORDER — PHENYLEPHRINE HCL 10 MG/ML IJ SOLN
INTRAMUSCULAR | Status: DC | PRN
  Administered 2015-03-20: 120 ug via INTRAVENOUS

## 2015-03-20 MED ORDER — FENTANYL CITRATE (PF) 250 MCG/5ML IJ SOLN
INTRAMUSCULAR | Status: AC
  Filled 2015-03-20: qty 5

## 2015-03-20 MED ORDER — PHENYLEPHRINE HCL 10 MG/ML IJ SOLN
10.0000 mg | INTRAVENOUS | Status: DC | PRN
  Administered 2015-03-20: 20 ug/min via INTRAVENOUS

## 2015-03-20 MED ORDER — PROPOFOL 10 MG/ML IV BOLUS
INTRAVENOUS | Status: DC | PRN
  Administered 2015-03-20: 100 mg via INTRAVENOUS

## 2015-03-20 MED ORDER — MENTHOL 3 MG MT LOZG: 1.0000 | LOZENGE | OROMUCOSAL | Status: DC | PRN

## 2015-03-20 MED ORDER — PANTOPRAZOLE SODIUM 40 MG PO TBEC
80.0000 mg | DELAYED_RELEASE_TABLET | Freq: Every day | ORAL | Status: DC
  Administered 2015-03-21 – 2015-03-24 (×4): 80 mg via ORAL
  Filled 2015-03-20 (×4): qty 2

## 2015-03-20 MED ORDER — DOCUSATE SODIUM 100 MG PO CAPS
100.0000 mg | ORAL_CAPSULE | Freq: Two times a day (BID) | ORAL | Status: DC
  Administered 2015-03-20 – 2015-03-24 (×8): 100 mg via ORAL
  Filled 2015-03-20 (×8): qty 1

## 2015-03-20 MED ORDER — LOSARTAN POTASSIUM 50 MG PO TABS: 100.0000 mg | ORAL_TABLET | Freq: Every day | ORAL | Status: DC

## 2015-03-20 MED ORDER — OXYCODONE-ACETAMINOPHEN 5-325 MG PO TABS
1.0000 | ORAL_TABLET | ORAL | Status: DC | PRN
  Administered 2015-03-20 – 2015-03-21 (×5): 1 via ORAL
  Filled 2015-03-20 (×5): qty 1

## 2015-03-20 MED ORDER — METOCLOPRAMIDE HCL 5 MG PO TABS
5.0000 mg | ORAL_TABLET | Freq: Three times a day (TID) | ORAL | Status: DC | PRN
Start: 2015-03-20 — End: 2015-03-24

## 2015-03-20 MED ORDER — DULOXETINE HCL 60 MG PO CPEP
60.0000 mg | ORAL_CAPSULE | Freq: Every day | ORAL | Status: DC
  Administered 2015-03-20 – 2015-03-24 (×5): 60 mg via ORAL
  Filled 2015-03-20 (×5): qty 1

## 2015-03-20 MED ORDER — FENTANYL CITRATE (PF) 100 MCG/2ML IJ SOLN
INTRAMUSCULAR | Status: AC
  Administered 2015-03-20: 50 ug via INTRAVENOUS
  Filled 2015-03-20: qty 2

## 2015-03-20 MED ORDER — BUPIVACAINE-EPINEPHRINE 0.25% -1:200000 IJ SOLN
INTRAMUSCULAR | Status: DC | PRN
  Administered 2015-03-20: 7 mL

## 2015-03-20 MED ORDER — LATANOPROST 0.005 % OP SOLN
1.0000 [drp] | Freq: Every day | OPHTHALMIC | Status: DC
  Administered 2015-03-20 – 2015-03-23 (×4): 1 [drp] via OPHTHALMIC
  Filled 2015-03-20: qty 2.5

## 2015-03-20 MED ORDER — ONDANSETRON HCL 4 MG/2ML IJ SOLN
INTRAMUSCULAR | Status: DC | PRN
  Administered 2015-03-20: 4 mg via INTRAVENOUS

## 2015-03-20 MED ORDER — ROCURONIUM BROMIDE 50 MG/5ML IV SOLN
INTRAVENOUS | Status: AC
  Filled 2015-03-20: qty 1

## 2015-03-20 MED ORDER — TRAZODONE HCL 50 MG PO TABS
50.0000 mg | ORAL_TABLET | Freq: Every day | ORAL | Status: DC
  Administered 2015-03-20 – 2015-03-23 (×3): 50 mg via ORAL
  Filled 2015-03-20 (×4): qty 1

## 2015-03-20 MED ORDER — CALCIUM CARBONATE-VITAMIN D 500-200 MG-UNIT PO TABS
1.0000 | ORAL_TABLET | Freq: Two times a day (BID) | ORAL | Status: DC
  Administered 2015-03-20 – 2015-03-24 (×8): 1 via ORAL
  Filled 2015-03-20 (×9): qty 1

## 2015-03-20 MED ORDER — IBUPROFEN 100 MG PO CHEW
100.0000 mg | CHEWABLE_TABLET | Freq: Three times a day (TID) | ORAL | Status: DC | PRN
  Filled 2015-03-20: qty 2

## 2015-03-20 MED ORDER — SODIUM CHLORIDE 0.9 % IV SOLN
INTRAVENOUS | Status: DC
  Administered 2015-03-20: 50 mL/h via INTRAVENOUS
  Administered 2015-03-22 – 2015-03-23 (×3): via INTRAVENOUS

## 2015-03-20 MED ORDER — GLYCOPYRROLATE 0.2 MG/ML IJ SOLN
INTRAMUSCULAR | Status: DC | PRN
  Administered 2015-03-20: 0.4 mg via INTRAVENOUS

## 2015-03-20 MED ORDER — ALLOPURINOL 100 MG PO TABS
100.0000 mg | ORAL_TABLET | Freq: Every day | ORAL | Status: DC
  Administered 2015-03-20 – 2015-03-24 (×5): 100 mg via ORAL
  Filled 2015-03-20 (×5): qty 1

## 2015-03-20 MED ORDER — FUROSEMIDE 20 MG PO TABS: 20.0000 mg | ORAL_TABLET | Freq: Every day | ORAL | Status: DC | PRN

## 2015-03-20 MED ORDER — LACTATED RINGERS IV SOLN
INTRAVENOUS | Status: DC | PRN
  Administered 2015-03-20: 10:00:00 via INTRAVENOUS

## 2015-03-20 MED ORDER — LIDOCAINE HCL (CARDIAC) 20 MG/ML IV SOLN
INTRAVENOUS | Status: AC
  Filled 2015-03-20: qty 10

## 2015-03-20 MED ORDER — PROPOFOL 10 MG/ML IV BOLUS
INTRAVENOUS | Status: AC
  Filled 2015-03-20: qty 20

## 2015-03-20 MED ORDER — ONDANSETRON HCL 4 MG/2ML IJ SOLN: 4.0000 mg | Freq: Once | INTRAMUSCULAR | Status: DC | PRN

## 2015-03-20 SURGICAL SUPPLY — 71 items
BIT DRILL 170X2.5X (BIT) IMPLANT
BIT DRILL 5/64X5 DISP (BIT) ×1 IMPLANT
BIT DRL 170X2.5X (BIT) ×1
BLADE SAG 18X100X1.27 (BLADE) ×3 IMPLANT
BOWL SMART MIX CTS (DISPOSABLE) ×2 IMPLANT
CAPT SHLDR REVTOTAL 2 ×2 IMPLANT
CEMENT BONE DEPUY (Cement) ×2 IMPLANT
CLOSURE WOUND 1/2 X4 (GAUZE/BANDAGES/DRESSINGS) ×1
COVER SURGICAL LIGHT HANDLE (MISCELLANEOUS) ×3 IMPLANT
DRAPE IMP U-DRAPE 54X76 (DRAPES) ×6 IMPLANT
DRAPE INCISE IOBAN 66X45 STRL (DRAPES) ×3 IMPLANT
DRAPE ORTHO SPLIT 77X108 STRL (DRAPES) ×6
DRAPE SURG ORHT 6 SPLT 77X108 (DRAPES) ×2 IMPLANT
DRAPE U-SHAPE 47X51 STRL (DRAPES) ×3 IMPLANT
DRAPE X-RAY CASS 24X20 (DRAPES) IMPLANT
DRILL 2.5 (BIT) ×3
DRSG ADAPTIC 3X8 NADH LF (GAUZE/BANDAGES/DRESSINGS) ×3 IMPLANT
DRSG PAD ABDOMINAL 8X10 ST (GAUZE/BANDAGES/DRESSINGS) ×3 IMPLANT
DURAPREP 26ML APPLICATOR (WOUND CARE) ×3 IMPLANT
ELECT BLADE 4.0 EZ CLEAN MEGAD (MISCELLANEOUS) ×3
ELECT NDL TIP 2.8 STRL (NEEDLE) ×1 IMPLANT
ELECT NEEDLE TIP 2.8 STRL (NEEDLE) ×3 IMPLANT
ELECT REM PT RETURN 9FT ADLT (ELECTROSURGICAL) ×3
ELECTRODE BLDE 4.0 EZ CLN MEGD (MISCELLANEOUS) ×1 IMPLANT
ELECTRODE REM PT RTRN 9FT ADLT (ELECTROSURGICAL) ×1 IMPLANT
GAUZE SPONGE 4X4 12PLY STRL (GAUZE/BANDAGES/DRESSINGS) ×3 IMPLANT
GLOVE BIOGEL PI ORTHO PRO 7.5 (GLOVE) ×2
GLOVE BIOGEL PI ORTHO PRO SZ8 (GLOVE) ×2
GLOVE PI ORTHO PRO STRL 7.5 (GLOVE) ×1 IMPLANT
GLOVE PI ORTHO PRO STRL SZ8 (GLOVE) ×1 IMPLANT
GLOVE SURG SS PI 7.5 STRL IVOR (GLOVE) ×2 IMPLANT
GLOVE SURG SS PI 8.0 STRL IVOR (GLOVE) ×2 IMPLANT
GOWN STRL REUS W/ TWL LRG LVL3 (GOWN DISPOSABLE) ×1 IMPLANT
GOWN STRL REUS W/ TWL XL LVL3 (GOWN DISPOSABLE) ×2 IMPLANT
GOWN STRL REUS W/TWL LRG LVL3 (GOWN DISPOSABLE) ×3
GOWN STRL REUS W/TWL XL LVL3 (GOWN DISPOSABLE) ×6
HANDPIECE INTERPULSE COAX TIP (DISPOSABLE)
KIT BASIN OR (CUSTOM PROCEDURE TRAY) ×3 IMPLANT
KIT ROOM TURNOVER OR (KITS) ×3 IMPLANT
MANIFOLD NEPTUNE II (INSTRUMENTS) ×3 IMPLANT
NDL 1/2 CIR MAYO (NEEDLE) ×1 IMPLANT
NDL HYPO 25GX1X1/2 BEV (NEEDLE) ×1 IMPLANT
NEEDLE 1/2 CIR MAYO (NEEDLE) ×3 IMPLANT
NEEDLE HYPO 25GX1X1/2 BEV (NEEDLE) ×3 IMPLANT
NS IRRIG 1000ML POUR BTL (IV SOLUTION) ×3 IMPLANT
PACK SHOULDER (CUSTOM PROCEDURE TRAY) ×3 IMPLANT
PAD ARMBOARD 7.5X6 YLW CONV (MISCELLANEOUS) ×6 IMPLANT
PIN GUIDE 1.2 (PIN) ×4 IMPLANT
PIN GUIDE GLENOPHERE 1.5MX300M (PIN) ×2 IMPLANT
PIN METAGLENE 2.5 (PIN) ×2 IMPLANT
SET HNDPC FAN SPRY TIP SCT (DISPOSABLE) IMPLANT
SLING ARM IMMOBILIZER MED (SOFTGOODS) ×2 IMPLANT
SLING ARM LRG ADULT FOAM STRAP (SOFTGOODS) IMPLANT
SLING ARM MED ADULT FOAM STRAP (SOFTGOODS) ×2 IMPLANT
SPONGE LAP 18X18 X RAY DECT (DISPOSABLE) IMPLANT
SPONGE LAP 4X18 X RAY DECT (DISPOSABLE) ×3 IMPLANT
STRIP CLOSURE SKIN 1/2X4 (GAUZE/BANDAGES/DRESSINGS) ×2 IMPLANT
SUCTION FRAZIER TIP 10 FR DISP (SUCTIONS) ×3 IMPLANT
SUT FIBERWIRE #2 38 T-5 BLUE (SUTURE) ×6
SUT MNCRL AB 4-0 PS2 18 (SUTURE) ×3 IMPLANT
SUT VIC AB 2-0 CT1 27 (SUTURE) ×3
SUT VIC AB 2-0 CT1 TAPERPNT 27 (SUTURE) ×1 IMPLANT
SUT VICRYL 0 CT 1 36IN (SUTURE) ×3 IMPLANT
SUTURE FIBERWR #2 38 T-5 BLUE (SUTURE) ×2 IMPLANT
SYR CONTROL 10ML LL (SYRINGE) ×3 IMPLANT
TOWEL OR 17X24 6PK STRL BLUE (TOWEL DISPOSABLE) ×3 IMPLANT
TOWEL OR 17X26 10 PK STRL BLUE (TOWEL DISPOSABLE) ×3 IMPLANT
TOWER CARTRIDGE SMART MIX (DISPOSABLE) IMPLANT
TRAY FOLEY CATH 16FRSI W/METER (SET/KITS/TRAYS/PACK) IMPLANT
WATER STERILE IRR 1000ML POUR (IV SOLUTION) ×3 IMPLANT
YANKAUER SUCT BULB TIP NO VENT (SUCTIONS) ×3 IMPLANT

## 2015-03-20 NOTE — Transfer of Care (Signed)
Immediate Anesthesia Transfer of Care Note  Patient: Jordan [name]  Procedure(s) Performed: Procedure(s): RIGHT REVERSE SHOULDER ARTHROPLASTY (Right)  Patient Location: PACU  Anesthesia Type:General and Regional  Level of Consciousness: awake and alert   Airway & Oxygen Therapy: Patient Spontanous Breathing and Patient connected to nasal cannula oxygen  Post-op Assessment: Report given to RN, Post -op Vital signs reviewed and stable and Patient moving all extremities X 4  Post vital signs: Reviewed and stable  Last Vitals:  Filed Vitals:   03/20/15 1135  BP: 178/40  Pulse: 80  Temp:   Resp: 20    Complications: No apparent anesthesia complications

## 2015-03-20 NOTE — Anesthesia Preprocedure Evaluation (Addendum)
Anesthesia Evaluation  Patient identified by MRN, date of birth, ID band Patient awake and Patient confused    Airway Mallampati: III       Dental  (+) Dental Advisory Given   Pulmonary shortness of breath,    Pulmonary exam normal        Cardiovascular hypertension, Pt. on medications Normal cardiovascular exam+ dysrhythmias + pacemaker      Neuro/Psych  Headaches, Depression  Neuromuscular disease CVA    GI/Hepatic GERD  Medicated,  Endo/Other  Hypothyroidism   Renal/GU CRFRenal disease     Musculoskeletal  (+) Arthritis , Osteoarthritis,    Abdominal   Peds  Hematology   Anesthesia Other Findings Dementia  Reproductive/Obstetrics                            Anesthesia Physical Anesthesia Plan  ASA: III  Anesthesia Plan: General and Regional   Post-op Pain Management: GA combined w/ Regional for post-op pain   Induction: Intravenous  Airway Management Planned: Oral ETT  Additional Equipment:   Intra-op Plan:   Post-operative Plan: Extubation in OR  Informed Consent: I have reviewed the patients History and Physical, chart, labs and discussed the procedure including the risks, benefits and alternatives for the proposed anesthesia with the patient or authorized representative who has indicated his/her understanding and acceptance.   Dental advisory given  Plan Discussed with: CRNA, Anesthesiologist and Surgeon  Anesthesia Plan Comments:        Anesthesia Quick Evaluation

## 2015-03-20 NOTE — Interval H&P Note (Signed)
History and Physical Interval Note:  03/20/2015 11:10 AM  Jordan [name]  has presented today for surgery, with the diagnosis of RIGHT SHOULDER OA, AND ROTATOR CUFF INSUFFICENCY  The various methods of treatment have been discussed with the patient and family. After consideration of risks, benefits and other options for treatment, the patient has consented to  Procedure(s): RIGHT REVERSE SHOULDER ARTHROPLASTY (Right) as a surgical intervention .  The patient's history has been reviewed, patient examined, no change in status, stable for surgery.  I have reviewed the patient's chart and labs.  Questions were answered to the patient's satisfaction.     Beverlyann Broxterman,STEVEN R

## 2015-03-20 NOTE — Anesthesia Postprocedure Evaluation (Signed)
  Anesthesia Post-op Note  Patient: Jordan [name]  Procedure(s) Performed: Procedure(s): RIGHT REVERSE SHOULDER ARTHROPLASTY (Right)  Patient Location: PACU  Anesthesia Type: General, Regional   Level of Consciousness: awake, alert  and oriented  Airway and Oxygen Therapy: Patient Spontanous Breathing  Post-op Pain: moderate  Post-op Assessment: Post-op Vital signs reviewed  Post-op Vital Signs: Reviewed  Last Vitals:  Filed Vitals:   03/20/15 1502  BP: 143/45  Pulse: 80  Temp: 36.9 C  Resp: 18    Complications: No apparent anesthesia complications

## 2015-03-20 NOTE — Progress Notes (Signed)
Patient admitted from PACU, [place] speaking only. Daughter at bedside, interpreting. AAO x4. Dressing to right shoulder with sling clean, dry and intact.

## 2015-03-20 NOTE — Progress Notes (Signed)
Utilization review completed.  

## 2015-03-20 NOTE — Brief Op Note (Signed)
03/20/2015  2:01 PM  PATIENT:  Jordan [name]  81 y.o. female  PRE-OPERATIVE DIAGNOSIS:  RIGHT SHOULDER OA, AND ROTATOR CUFF INSUFFICENCY  POST-OPERATIVE DIAGNOSIS:  RIGHT SHOULDER OA, AND ROTATOR CUFF INSUFFICENCY  PROCEDURE:  Procedure(s): RIGHT REVERSE SHOULDER ARTHROPLASTY (Right) DePuy Delta xtend  SURGEON:  Surgeon(s) and Role:    * Netta Cedars, MD - Primary  PHYSICIAN ASSISTANT:   ASSISTANTS: Ventura Bruns, PA-C   ANESTHESIA:   regional and general  EBL:  Total I/O In: 700 [I.V.:700] Out: 200 [Blood:200]  BLOOD ADMINISTERED:none  DRAINS: none   LOCAL MEDICATIONS USED:  MARCAINE     SPECIMEN:  No Specimen  DISPOSITION OF SPECIMEN:  N/A  COUNTS:  YES  TOURNIQUET:  * No tourniquets in log *  DICTATION: .Other Dictation: Dictation Number [id number]  PLAN OF CARE: Admit to inpatient   PATIENT DISPOSITION:  PACU - hemodynamically stable.   Delay start of Pharmacological VTE agent (>24hrs) due to surgical blood loss or risk of bleeding: not applicable

## 2015-03-20 NOTE — Progress Notes (Signed)
[employer] Rep called, Roseanne Reno, and made aware of surgery time and location.  Stated she would be available for pt's surgery

## 2015-03-20 NOTE — Discharge Instructions (Signed)
Ice to the shoulder constantly.  Ok to use the right arm for ADLs.  Ok to use the right arm for balancing her weight.  Keep the incision clean and dry for one week, then ok to shower and get the wound wet.  May remove sling.  Use for comfort only.  Home health PT, OT  Follow up in the office in two weeks.

## 2015-03-20 NOTE — Anesthesia Procedure Notes (Addendum)
Procedure Name: Intubation Date/Time: 03/20/2015 11:52 AM Performed by: Garrison [place] T Pre-anesthesia Checklist: Patient identified, Emergency Drugs available, Suction available and Patient being monitored Patient Re-evaluated:Patient Re-evaluated prior to inductionOxygen Delivery Method: Circle system utilized Preoxygenation: Pre-oxygenation with 100% oxygen Intubation Type: IV induction Ventilation: Mask ventilation without difficulty Laryngoscope Size: Miller and 2 Grade View: Grade I Tube type: Oral Tube size: 7.0 mm Number of attempts: 1 Airway Equipment and Method: Stylet and LTA kit utilized Placement Confirmation: ETT inserted through vocal cords under direct vision,  positive ETCO2 and breath sounds checked- equal and bilateral Secured at: 21 cm Tube secured with: Tape Dental Injury: Teeth and Oropharynx as per pre-operative assessment    Anesthesia Regional Block:  Interscalene brachial plexus block  Pre-Anesthetic Checklist: ,, timeout performed, Correct Patient, Correct Site, Correct Laterality, Correct Procedure, Correct Position, site marked, Risks and benefits discussed,  Surgical consent,  Pre-op evaluation,  At surgeon's request and post-op pain management  Laterality: Right  Prep: chloraprep and alcohol swabs       Needles:  Injection technique: Single-shot  Needle Type: Stimulator Needle - 80          Additional Needles:  Procedures: nerve stimulator Interscalene brachial plexus block  Nerve Stimulator or Paresthesia:  Response: 0.5 mA, 0.1 ms, 2 cm  Additional Responses:   Narrative:  Start time: 03/20/2015 11:00 AM End time: 03/20/2015 11:11 AM Injection made incrementally with aspirations every 5 mL.  Performed by: Personally  Anesthesiologist: Kate Sable  Additional Notes: Pt accepts procedure w/ risks. 18cc 0.5% Marcaine w/ epi w/o difficulty or discomfort. Pt tolerated well. GES

## 2015-03-21 LAB — BASIC METABOLIC PANEL
ANION GAP: 6 (ref 5–15)
BUN: 24 mg/dL — AB (ref 6–20)
CHLORIDE: 99 mmol/L — AB (ref 101–111)
CO2: 30 mmol/L (ref 22–32)
CREATININE: 1.33 mg/dL — AB (ref 0.44–1.00)
Calcium: 8.6 mg/dL — ABNORMAL LOW (ref 8.9–10.3)
GFR calc non Af Amer: 33 mL/min — ABNORMAL LOW (ref >60)
GFR, EST AFRICAN AMERICAN: 39 mL/min — AB (ref >60)
GLUCOSE: 122 mg/dL — AB (ref 65–99)
POTASSIUM: 5 mmol/L (ref 3.5–5.1)
SODIUM: 135 mmol/L (ref 135–145)

## 2015-03-21 LAB — HEMOGLOBIN AND HEMATOCRIT, BLOOD
HEMATOCRIT: 30.2 % — AB (ref 36.0–46.0)
HEMOGLOBIN: 9.4 g/dL — AB (ref 12.0–15.0)

## 2015-03-21 MED ORDER — SODIUM CHLORIDE 0.9 % IV BOLUS (SEPSIS)
250.0000 mL | Freq: Once | INTRAVENOUS | Status: AC
  Administered 2015-03-21: 250 mL via INTRAVENOUS

## 2015-03-21 NOTE — Progress Notes (Signed)
Patient spanish speaking daughter at bedside .Per patient daughter patient needs to void.Patient assisted with bedpan but unable to void at present time.Bladder scanned patient for 96 ml of urine . Will continue to monitor.

## 2015-03-21 NOTE — Progress Notes (Signed)
Patient still unable to void .Bladder scanned for 318 ml urine.In and out cath for 400 ml of clear yellow urine per protocol.Will continue to monitor patient.

## 2015-03-21 NOTE — Progress Notes (Addendum)
Lacie Draft, PA-C was rounding and we discussed the patient low blood pressure and she gave an order to bolus her with Normal Saline 261mL then return setting to 50 mL/hr until patient was able to eat.  Patient continued to have low pressures and her Ahmed Prima was held today per order by [employer].  Patient has been up to the chair today for about 3 hours and is now in bed resting.  Daughter was concerned about her heart rate and blood pressure. The patient is being monitored and is on telementry.  The patient's only concern was that she was in pain.  She was given 1 Percocet 5-325 per orders.  Ice was placed on the patient's shoulder and she remains in a sling and the arm has been elevated.

## 2015-03-21 NOTE — Evaluation (Signed)
Physical Therapy Evaluation Patient Details Name: Jordan [name] MRN: [id number] DOB: 06/13/1919 Today's Date: 03/21/2015   History of Present Illness  81 y.o. female s/p R reverse TSA.  Clinical Impression  Pt admitted with above diagnosis. Pt currently with functional limitations due to the deficits listed below (see PT Problem List). On eval, pt required mod assist for bed mobility and transfers. Pt will benefit from skilled PT to increase their independence and safety with mobility to allow discharge to the venue listed below.  Pt has all needed home equipment.     Follow Up Recommendations Home health PT;Supervision/Assistance - 24 hour    Equipment Recommendations  None recommended by PT    Recommendations for Other Services       Precautions / Restrictions Precautions Precautions: Fall Required Braces or Orthoses: Sling (for comfort) Restrictions Weight Bearing Restrictions: Yes RUE Weight Bearing: Non weight bearing      Mobility  Bed Mobility Overal bed mobility: Needs Assistance Bed Mobility: Supine to Sit     Supine to sit: Mod assist     General bed mobility comments: assist to power up  Transfers Overall transfer level: Needs assistance Equipment used: None Transfers: Sit to/from [facility] Sit to Stand: Mod assist Stand pivot transfers: Mod assist       General transfer comment: verbal cues for sequencing, assist to power up  Ambulation/Gait             General Gait Details: Gait not address per family request. Son prefers treatment limited to transfers as pt will use w/c at home. Family is very concerned about falls.  Stairs            Wheelchair Mobility    Modified Rankin (Stroke Patients Only)       Balance   Sitting-balance support: Feet supported;Single extremity supported Sitting balance-Leahy Scale: Good     Standing balance support: Single extremity supported;During functional activity Standing  balance-Leahy Scale: Poor                               Pertinent Vitals/Pain Pain Assessment: Faces Faces Pain Scale: Hurts little more Pain Location: RUE with mobility Pain Descriptors / Indicators: Grimacing;Guarding Pain Intervention(s): Limited activity within patient's tolerance;Monitored during session;Repositioned    Home Living Family/patient expects to be discharged to:: Private residence Living Arrangements: Children Available Help at Discharge: Family;Personal care attendant;Available 24 hours/day Type of Home: Apartment Home Access: Level entry     Home Layout: One level Home Equipment: Walker - 4 wheels;Bedside commode;Wheelchair - manual Additional Comments: Family or CNA will be with pt 24/hr day    Prior Function Level of Independence: Needs assistance   Gait / Transfers Assistance Needed: uses RW for short distance ambulation in home  ADL's / Homemaking Assistance Needed: daugther assist pt with dressing/bathing, son does all cooking/cleaning        Hand Dominance        Extremity/Trunk Assessment   Upper Extremity Assessment: Defer to OT evaluation           Lower Extremity Assessment: Generalized weakness      Cervical / Trunk Assessment: Kyphotic  Communication   Communication: Other (comment) (Spanish speaking. Son present in room and used as interpreter.)  Cognition Arousal/Alertness: Awake/alert Behavior During Therapy: WFL for tasks assessed/performed Overall Cognitive Status: Within Functional Limits for tasks assessed  General Comments      Exercises        Assessment/Plan    PT Assessment Patient needs continued PT services  PT Diagnosis Difficulty walking;Acute pain   PT Problem List Decreased strength;Decreased activity tolerance;Decreased balance;Decreased mobility;Pain;Decreased safety awareness  PT Treatment Interventions Gait training;Functional mobility  training;Therapeutic activities;Therapeutic exercise;Patient/family education;Balance training   PT Goals (Current goals can be found in the Care Plan section) Acute Rehab PT Goals Patient Stated Goal: home PT Goal Formulation: With patient/family Time For Goal Achievement: 04/04/15 Potential to Achieve Goals: Good    Frequency Min 3X/week   Barriers to discharge        Co-evaluation               End of Session Equipment Utilized During Treatment: Gait belt Activity Tolerance: Patient tolerated treatment well Patient left: in chair;with family/visitor present;with call bell/phone within reach Nurse Communication: Mobility status         Time: 1135-1206 PT Time Calculation (min) (ACUTE ONLY): 31 min   Charges:   PT Evaluation $Initial PT Evaluation Tier I: 1 Procedure PT Treatments $Therapeutic Activity: 8-22 mins   PT G Codes:        Lorriane Shire 03/21/2015, 12:20 PM

## 2015-03-21 NOTE — Op Note (Signed)
[name], Jordan [name]                    ACCOUNT NO.:  [id number]  MEDICAL RECORD NO.:  [id number]  LOCATION:  [id number]                        FACILITY:  [facility]  PHYSICIAN:  Doran Heater. Veverly Fells, M.D. DATE OF BIRTH:  1919/07/20  DATE OF PROCEDURE:  03/20/2015 DATE OF DISCHARGE:                              OPERATIVE REPORT   PREOPERATIVE DIAGNOSES:  Right shoulder end-stage arthritis and rotator cuff tear arthropathy.  POSTOPERATIVE DIAGNOSES:  Right shoulder end-stage arthritis and rotator cuff tear arthropathy.  PROCEDURE PERFORMED:  Right reverse total shoulder arthroplasty using DePuy Delta Xtend prosthesis.  ATTENDING SURGEON:  Doran Heater. Veverly Fells, M.D.  ASSISTANT:  Charletta Cousin Dixon, [place], who scrubbed the entire procedure and was necessary for satisfactory completion of surgery.  ANESTHESIA:  General anesthesia was used plus interscalene block.  ESTIMATED BLOOD LOSS:  100 mL.  FLUID REPLACEMENT:  1000 mL crystalloid.  INSTRUMENT COUNTS:  Correct.  COMPLICATIONS:  There were no complications.  ANTIBIOTICS:  Perioperative antibiotics were given.  INDICATIONS:  The patient is a 81 year old female, presents with debilitating right shoulder pain and loss of function secondary to rotator cuff tear arthropathy.  The patient has had progressive pain despite conservative management.  The patient presents desiring operative treatment to restore function and eliminate pain.  An extensive discussion with the family regarding options for treatment, recommending conservative care.  Due to the patient's disabling pain, it was unresponsive to all measures of conservative treatment.  We elected to proceed surgically once we had medical and subspecialist clearance for her.  Informed consent obtained.  DESCRIPTION OF PROCEDURE:  After an adequate level of anesthesia achieved, the patient was positioned in modified beach-chair position. The right shoulder was correctly identified and sterilely  prepped and draped in usual manner.  Time-out was called.  After sterile prep and drape, we entered the shoulder using standard deltopectoral incision starting at the coracoid process extending down to the anterior humerus. Dissection was down through the subcutaneous tissues.  We identified the cephalic vein, took it laterally over the deltoid, pectoralis was taken medially.  Conjoined tendon was identified and retracted medially, released the subscapularis off the lesser tuberosity and tagged that. We then progressively released the inferior capsule off the inferior humeral neck, progressively externally rotating.  We then released the biceps tendon and the remaining rotator cuff tendon as we extended the shoulder and delivered the humerus out of the wound.  There was advanced wear including complete bone-on-bone noted.  We went ahead and reamed the humeral canal with up to a size 10 reamer and then used our size 10 intramedullary resection guide and resected our humerus with the oscillating saw.  We then removed excess osteophytes.  The bone was extremely soft.  We then did our metaphyseal preparation with a size 1 right metaphyseal reamer.  We did that by hand.  We then went ahead and placed our 10 body epi-1 right metaphysis, set on 0 degrees and placed in 20 degrees of retroversion.  We impacted that in place and then left that in to protect the bone.  We retracted the humerus posteriorly and did a 360-degree labral removal.  We also removed the  remaining rotator cuff tendon and tissue, so there would not be any soft tissue impingement.  Once we had the glenoid exposed, we drilled our central guidepin.  We then reamed for the metaglene, drilled our central peg hole, impacted the metaglene in position and then secured with 48 screw inferiorly, 30 into the base of the coracoid and 18 nonlocked anteriorly.  The superior and inferior screws were locked.  There was no room for 1  posteriorly.  The patient was extremely small in terms of her bone size, but the metaglene was well supported.  We then used a 38 eccentric glenosphere and impacted that and screwed into position, dialed anteriorly and inferiorly as for as the eccentricity.  We next trialed with a 38+ 6 poly and felt like we had excellent stability and soft tissue tension.  We removed the trial components from the humeral side, irrigated the canal fully, dried it and then used a hybrid system with a global unite stem that has pore coat and then a HA metaphysis. We selected the 10 pore coat body and then the epi-1 right HA coated and then put that on the 0 setting and screwed that in position.  We then vacuum mixed cement, used a T1 cement and cemented the inferior portion of the stem into the canal and impacted the top part where we get good bone HA contact.  Once that was placed in 20 degrees of retroversion, we allowed that cement to set up.  Then, we re-trialed with a 38+ 6 and felt like that was the appropriate size.  We selected the real 38+ 6 poly, impacted that in position, reduced the shoulder with a nice little pop, checked to make sure we had axillary nerve, which was free and clear and not under too much tension.  Conjoint was nice and tight.  No gapping with external rotation and negative sulcus.  We then went ahead and resected the remaining subscapularis, irrigated thoroughly, closed the deltopectoral interval with 0 Vicryl suture followed by 2-0 Vicryl subcutaneous closure and 4-0 Monocryl for skin.  Steri-Strips applied followed by sterile dressing.  The patient tolerated the surgery well.     Doran Heater. Veverly Fells, M.D.     SRN/MEDQ  D:  03/20/2015  T:  03/21/2015  Job:  [id number]

## 2015-03-21 NOTE — Evaluation (Signed)
Occupational Therapy Evaluation Patient Details Name: Jordan [name] MRN: [id number] DOB: [birth year] Today's Date: 03/21/2015    History of Present Illness 81 y.o. female s/p R reverse TSA.   Clinical Impression   Pt was assisted for IADL and ambulated with a walker and assistance prior to admission.  Pt presents with post operative pain which increased to moderate level with AAROM of R shoulder. Educated pt and son in ADL, positioning, sling use and exercises with handout provided to reinforce.  Will follow acutely.    Follow Up Recommendations  Home health OT;Supervision/Assistance - 24 hour    Equipment Recommendations  None recommended by OT    Recommendations for Other Services       Precautions / Restrictions Precautions Precautions: Fall Required Braces or Orthoses: Sling (for comfort and sleep) Restrictions Weight Bearing Restrictions: Yes RUE Weight Bearing: Non weight bearing      Mobility Bed Mobility Overal bed mobility: Needs Assistance Bed Mobility: Supine to Sit     Supine to sit: Mod assist     General bed mobility comments: assist to power up  Transfers Overall transfer level: Needs assistance Equipment used: None Transfers: Sit to/from [facility] Sit to Stand: Mod assist Stand pivot transfers: Mod assist       General transfer comment: verbal cues for sequencing, assist to power up    Balance   Sitting-balance support: Feet supported;Single extremity supported Sitting balance-Leahy Scale: Good     Standing balance support: Single extremity supported;During functional activity Standing balance-Leahy Scale: Poor                              ADL Overall ADL's : Needs assistance/impaired Eating/Feeding: Set up;Sitting Eating/Feeding Details (indicate cue type and reason): with L hand, encouraged pt to use R Grooming: Wash/dry hands;Wash/dry face;Set up;Sitting                   Toilet Transfer:  Moderate assistance;Stand-pivot;BSC   Toileting- Clothing Manipulation and Hygiene: Moderate assistance;Sit to/from stand         General ADL Comments: Educated pt and son in hemitechniques for bathing and dressing, positioning in chair and bed, sling donning and doffing. Pt is dependent in bathing and dressing at baseline, but encouraged son to allow pt to do as much as she can safely.     Vision     Perception     Praxis      Pertinent Vitals/Pain Pain Assessment: Faces Faces Pain Scale: Hurts even more Pain Location: R shoulder with exercises Pain Descriptors / Indicators: Aching;Grimacing;Guarding Pain Intervention(s): Limited activity within patient's tolerance;Monitored during session;Patient requesting pain meds-RN notified;Repositioned     Hand Dominance Right   Extremity/Trunk Assessment Upper Extremity Assessment Upper Extremity Assessment: Defer to OT evaluation RUE Deficits / Details: full AROM elbow to hand, tolerated AAROM FF to 60, abd to 45 and ER to neutral x 5 LUE Deficits / Details: full AROM   Lower Extremity Assessment Lower Extremity Assessment: Defer to PT evaluation   Cervical / Trunk Assessment Cervical / Trunk Assessment: Kyphotic   Communication Communication Communication: Prefers language other than English (spanish)   Cognition Arousal/Alertness: Awake/alert Behavior During Therapy: WFL for tasks assessed/performed Overall Cognitive Status: Within Functional Limits for tasks assessed                     General Comments       Exercises  Shoulder Instructions      Home Living Family/patient expects to be discharged to:: Private residence Living Arrangements: Children Available Help at Discharge: Family;Personal care attendant;Available 24 hours/day Type of Home: Apartment Home Access: Level entry     Home Layout: One level     Bathroom Shower/Tub: Occupational psychologist: Standard     Home  Equipment: Environmental consultant - 4 wheels;Bedside commode;Wheelchair - Biomedical scientist Comments: Family or CNA will be with pt 24/hr day      Prior Functioning/Environment Level of Independence: Needs assistance  Gait / Transfers Assistance Needed: uses RW for short distance ambulation in home ADL's / Homemaking Assistance Needed: daughter or CNA assist pt with bathing,dressing, toileting, family does all IADL        OT Diagnosis: Generalized weakness;Acute pain   OT Problem List: Decreased strength;Decreased activity tolerance;Impaired balance (sitting and/or standing);Decreased coordination;Impaired UE functional use;Pain   OT Treatment/Interventions: Self-care/ADL training;Therapeutic exercise;DME and/or AE instruction;Therapeutic activities;Patient/family education    OT Goals(Current goals can be found in the care plan section) Acute Rehab OT Goals Patient Stated Goal: home OT Goal Formulation: With patient Time For Goal Achievement: 03/28/15 Potential to Achieve Goals: Good ADL Goals Pt/caregiver will Perform Home Exercise Program: Increased ROM;Right Upper extremity;With minimal assist (active protocol) Additional ADL Goal #1: Pt/caregivers will be independent in positioning R UE in bed, chair and sling. Additional ADL Goal #2: Pt/caregiver will verbalize understanding of hemitechniques for bathing and dressing.  OT Frequency: Min 2X/week   Barriers to D/C:            Co-evaluation              End of Session    Activity Tolerance: Patient tolerated treatment well Patient left: in chair;with call bell/phone within reach   Time: 1201-1231 OT Time Calculation (min): 30 min Charges:  OT General Charges $OT Visit: 1 Procedure OT Evaluation $Initial OT Evaluation Tier I: 1 Procedure OT Treatments $Therapeutic Exercise: 8-22 mins G-Codes:    Malka So 03/21/2015, 12:47 PM  [phone]

## 2015-03-21 NOTE — Progress Notes (Addendum)
Subjective: 1 Day Post-Op Procedure(s) (LRB): RIGHT REVERSE SHOULDER ARTHROPLASTY (Right) Patient reports pain as moderate.    Pt does not speak english, her daughter is here to translate. She is c/o pain in the shoulder and into upper arm. Still noting some numbness and tingling into the fingers which is wearing off. She did require I&O cath overnight. She has not been drinking much per her daughter.   Objective: Vital signs in last 24 hours: Temp:  [98.3 F (36.8 C)-98.9 F (37.2 C)] 98.9 F (37.2 C) (11/19 0537) Pulse Rate:  [72-87] 83 (11/19 0537) Resp:  [11-22] 19 (11/19 0537) BP: (97-194)/(30-64) 99/30 mmHg (11/19 0537) SpO2:  [94 %-100 %] 97 % (11/19 0537) Weight:  [62.914 kg (138 lb 11.2 oz)] 62.914 kg (138 lb 11.2 oz) (11/18 0912)  Intake/Output from previous day: 11/18 0701 - 11/19 0700 In: 1133.3 [I.V.:1133.3] Out: 900 [Urine:700; Blood:200] Intake/Output this shift:     Recent Labs  03/21/15 0309  HGB 9.4*    Recent Labs  03/21/15 0309  HCT 30.2*    Recent Labs  03/21/15 0309  NA 135  K 5.0  CL 99*  CO2 30  BUN 24*  CREATININE 1.33*  GLUCOSE 122*  CALCIUM 8.6*   No results for input(s): LABPT, INR in the last 72 hours.  Neurologically intact ABD soft Neurovascular intact Sensation intact distally Intact pulses distally Dorsiflexion/Plantar flexion intact Incision: dressing C/D/I and no drainage No cellulitis present Compartment soft no sign of DVT  Assessment/Plan: 1 Day Post-Op Procedure(s) (LRB): RIGHT REVERSE SHOULDER ARTHROPLASTY (Right) Advance diet Up with therapy Pt hypotensive- will start bolus 250cc of normal saline and hold losartan Discussed pain control, do not want to cause further hypotension with too many narcotics PT to start today Possible D/C tomorrow depending on pain control and BP Redraw labs in AM Dressing change tomorrow  Lacie Draft M. 03/21/2015, 9:01 AM

## 2015-03-22 NOTE — Progress Notes (Signed)
Patient ID: TALETHA BISSINGER, female   DOB: 1919/08/15, 81 y.o.   MRN: [id number] Subjective: 2 Days Post-Op Procedure(s) (LRB): RIGHT REVERSE SHOULDER ARTHROPLASTY (Right)    Patient reports pain as moderate to severe.  Family at bedside.    Objective:   VITALS:   Filed Vitals:   03/22/15 0126 03/22/15 0549  BP: 97/34 102/33  Pulse:  95  Temp:  99.6 F (37.6 C)  Resp:  16    Neurovascular intact Incision: dressing C/D/I, right shoulder dressing changed  LABS  Recent Labs  03/21/15 0309  HGB 9.4*  HCT 30.2*     Recent Labs  03/21/15 0309  NA 135  K 5.0  BUN 24*  CREATININE 1.33*  GLUCOSE 122*    No results for input(s): LABPT, INR in the last 72 hours.   Assessment/Plan: 2 Days Post-Op Procedure(s) (LRB): RIGHT REVERSE SHOULDER ARTHROPLASTY (Right)   Advance diet Up with therapy Plan for discharge tomorrow   A lot of social and functional issues that may make a challenge to get her home Will defer to Dr. Veverly Fells tomorrow to decide disposition

## 2015-03-22 NOTE — Progress Notes (Signed)
Patient has been resting in bed all shift with family at bedside. Blood pressure medicine held this morning due to low BP trend over the past few days.  Patient had on small urine occurrence this shift. Daughter was concerned. Performed a bladder scan and in and out cath at 1630. Removed 459mL. Will continue to monitor.

## 2015-03-22 NOTE — Progress Notes (Addendum)
Regarding BP: Patient's blood pressure has remained low 124/42 at 2000 on 03/21/15 and 97/34 at 0126.  Patient is alert and oriented.  Family member has remained at the bedside and have been interpreting.  Will continue to monitor.  Regarding output: patient has voided only 50 mL thus far.  At 0200 did bladder scan, shows 218 mL.  Placed warm compression on lower abdomen and will get patient up again to [facility] later this am.  She is trying to get some sleep currently.

## 2015-03-22 NOTE — Progress Notes (Signed)
Patient's current BP is 102/33 lying down.  She is alert and oriented.  Family still concerned regarding low BP.  Re-inerated possible reasons for low BP and our continued monitoring.  Daughter would like to speak with rounding doctor today.  Patient still did not void anymore since 0100 50 mL.  Patient had zero urge to void; placed warm compression.  Did bladder scan at 0600 showed 402 mL.  Did I/O cath at 0615 removed 425 mL of urine.    Patient is resting comfortably in bed.

## 2015-03-23 ENCOUNTER — Encounter (HOSPITAL_COMMUNITY): Payer: Self-pay | Admitting: Orthopedic Surgery

## 2015-03-23 NOTE — Progress Notes (Signed)
Occupational Therapy treatment note:  Pt performed AAROM exercise Rt UE.  Son demonstrates good understanding of HEP, sling wear, positioning.  Pt refused OOB, encouraged son to have her get up with nsg later this pm.      03/22/15 1100  OT Visit Information  Last OT Received On 03/22/15  Assistance Needed +1  History of Present Illness 81 y.o. female s/p R reverse TSA.  OT Time Calculation  OT Start Time (ACUTE ONLY) 1104  OT Stop Time (ACUTE ONLY) 1127  OT Time Calculation (min) 23 min  Precautions  Precautions Fall  Precaution Comments Active protoccol.  FF 140, abd 60, ER 90.  Sling for sleep and comfort   Required Braces or Orthoses Sling  Pain Assessment  Pain Assessment 0-10  Pain Score 8  Pain Location Rt UE   Pain Descriptors / Indicators Grimacing;Aching  Pain Intervention(s) Limited activity within patient's tolerance;Monitored during session  Cognition  Arousal/Alertness Awake/alert;Lethargic  Behavior During Therapy WFL for tasks assessed/performed  Overall Cognitive Status Within Functional Limits for tasks assessed  Bed Mobility  General bed mobility comments Pt refused OOB due to fatigue.  Encouraged son to ask nursing to assist her to chair later in the afternoon   Restrictions  Weight Bearing Restrictions Yes  RUE Weight Bearing NWB  Exercises  Exercises Shoulder  Shoulder Instructions  Donning/doffing sling/immobilizer Caregiver independent with task  Correct positioning of sling/immobilizer Caregiver independent with task  ROM for elbow, wrist and digits of operated UE Moderate assistance;Caregiver independent with task  Sling wearing schedule (on at all times/off for ADL's) Caregiver independent with task  Proper positioning of operated UE when showering Caregiver independent with task  Dressing change Caregiver independent with task  Shoulder Exercises  Shoulder Flexion AAROM;Right;10 reps;Supine (80)  Shoulder ABduction AAROM;Right;10  reps;Supine (50)  Shoulder External Rotation AAROM;Right;10 reps (20)  Elbow Flexion AAROM;Right;10 reps;Supine  Elbow Extension AAROM;Right;10 reps;Supine  Wrist Flexion AAROM;Right;10 reps;Supine  Wrist Extension AAROM;Right;10 reps;Supine  Digit Composite Flexion AAROM;Right;10 reps;Supine  Composite Extension AROM;AAROM;Right;10 reps;Supine  OT - End of Session  Equipment Utilized During Treatment Other (comment);Oxygen (sling )  Activity Tolerance Patient limited by fatigue;Patient limited by pain  Patient left in bed;with call bell/phone within reach;with bed alarm set;with family/visitor present  OT Assessment/Plan  OT Plan Discharge plan remains appropriate  OT Frequency (ACUTE ONLY) Min 2X/week  Follow Up Recommendations Home health OT;Supervision/Assistance - 24 hour  OT Equipment None recommended by OT  OT Goal Progression  Progress towards OT goals Progressing toward goals  ADL Goals  Pt/caregiver will Perform Home Exercise Program Increased ROM;Right Upper extremity;With minimal assist  Additional ADL Goal #1 Pt/caregivers will be independent in positioning R UE in bed, chair and sling.  Additional ADL Goal #2 Pt/caregiver will verbalize understanding of hemitechniques for bathing and dressing.  OT General Charges  $OT Visit 1 Procedure  OT Treatments  $Self Care/Home Management  8-22 mins  $Therapeutic Exercise 8-22 mins  Lucille Passy, OTR/L [id number]'

## 2015-03-23 NOTE — Progress Notes (Signed)
   Subjective: 3 Days Post-Op Procedure(s) (LRB): RIGHT REVERSE SHOULDER ARTHROPLASTY (Right)  Spoke to family and pt this morning Voiding has improved but pt does not empty when she uses the restroom C/o continued weakness and pain to right shoulder Patient reports pain as moderate.  Objective:   VITALS:   Filed Vitals:   03/23/15 0442 03/23/15 1015  BP: 105/33 125/48  Pulse: 90 79  Temp: 99.1 F (37.3 C) 99 F (37.2 C)  Resp: 16 18    Right shoulder incision healing well nv intact distally Sling in place No rashes or edema  LABS  Recent Labs  03/21/15 0309  HGB 9.4*  HCT 30.2*     Recent Labs  03/21/15 0309  NA 135  K 5.0  BUN 24*  CREATININE 1.33*  GLUCOSE 122*     Assessment/Plan: 3 Days Post-Op Procedure(s) (LRB): RIGHT REVERSE SHOULDER ARTHROPLASTY (Right) Had a long discussion with family and pt about discharge and symptoms of weakness and pain Blood pressure has improved and voiding has improved this morning Recommend using today as a tune up day with plans to d/c home tomorrow with home health Pt and family in agreement Continue therapy and encourage mobilization    Merla Riches, MPAS, PA-C  03/23/2015, 10:19 AM

## 2015-03-23 NOTE — Care Management Important Message (Signed)
Important Message  Patient Details  Name: Jordan [name] MRN: [id number] Date of Birth: 1920-04-20   Medicare Important Message Given:  Yes    Melessa Cowell P Bricen Victory 03/23/2015, 3:40 PM

## 2015-03-23 NOTE — Progress Notes (Signed)
On call Dr Roselee Culver called this RN back regarding patients status (ie, inability to void). He states Dr Veverly Fells will see patient during AM rounds and will decide if we shall continue in / out caths or to place a foley catheter. He states he will pass this concern off in his doc to doc report. Daughter and patient notified at this time of plan and to discuss this with Dr Veverly Fells.

## 2015-03-23 NOTE — Progress Notes (Signed)
Patient is still having difficulty voiding on own. Staff has completed in / out cath x3 already since yesterday morning. Patients latest bladder scan was 375, had voided only 50 ml on bedpan. Paged on call to see if they want to insert foley catheter at this time or continue in / out caths. Pending call back.

## 2015-03-23 NOTE — Progress Notes (Signed)
Spoke with Dr Doren Custard regarding patients inability to void. Dr recommends nursing assist patient with restroom needs and encourage patient to void on own instead of performing in / out catheters unless patient feels very uncomfortable. At that point we can perform an in / out catheter. States will round in about two hours to discuss plan of care with patient and daughter.

## 2015-03-23 NOTE — Progress Notes (Signed)
Physical Therapy Treatment Patient Details Name: Jordan [name] MRN: [id number] DOB: 12/26/1919 Today's Date: 03/23/2015    History of Present Illness 81 y.o. female s/p R reverse TSA.    PT Comments    Pt with continued assist for all mobility. Dgtr concerned regarding caring for pt at home and provided education to pt, son and dgtr regarding: cueing for reciprocal scooting, anterior translation with standing, safe guarding and knee blocking with transfers, HEP, and need to limit mobility to transfer level function at home for pt safety/fall risk. Dgtr and son apprehensive about home but continue to feel they can assist pt at home and do not desire ST-SNF. Handout for HEP bil LE provided. Will continue to follow.   Follow Up Recommendations  Home health PT;Supervision/Assistance - 24 hour     Equipment Recommendations       Recommendations for Other Services       Precautions / Restrictions Precautions Precautions: Fall Required Braces or Orthoses: Sling Restrictions RUE Weight Bearing: Non weight bearing    Mobility  Bed Mobility               General bed mobility comments: verbalized education for bed mobility did not actively perform as pt in chair on arrival   Transfers Overall transfer level: Needs assistance   Transfers: Sit to/from Stand;Stand Pivot Transfers Sit to Stand: Min assist Stand pivot transfers: Mod assist       General transfer comment: max cues for reciprocal scooting, pushing up with LUE only, anterior translation and assist to rise from surface. Performed from recliner x 3 and straight back x 1. Dgtr assisted with second pivot trial. Cues for knee blocking, use of gait belt and cues for dgtr to provide to pt with mobility. Mod assist to guide pelvis with transfers  Ambulation/Gait Ambulation/Gait assistance: Mod assist Ambulation Distance (Feet): 4 Feet Assistive device: 1 person hand held assist Gait Pattern/deviations: Narrow base of  support;Shuffle;Leaning posteriorly   Gait velocity interpretation: Below normal speed for age/gender General Gait Details: pt able to stand and attempted hand held assist on left to guide pt however pt not utilizing assist with maintained posterior lean. Transitioned to anterior support with belt and pt grasping P.T. forearm. Pt able to step a couple feet from chair and on stepping backward increased difficulty and fatigue with mod assist to achieve sitting in chair with belt and bil knees blocked   Stairs            Wheelchair Mobility    Modified Rankin (Stroke Patients Only)       Balance Overall balance assessment: Needs assistance   Sitting balance-Leahy Scale: Fair   Postural control: Posterior lean   Standing balance-Leahy Scale: Poor Standing balance comment: assist for balance due to posterior lean                    Cognition Arousal/Alertness: Awake/alert Behavior During Therapy: Flat affect Overall Cognitive Status: Impaired/Different from baseline Area of Impairment: Following commands;Safety/judgement       Following Commands: Follows one step commands with increased time Safety/Judgement: Decreased awareness of deficits;Decreased awareness of safety     General Comments: pt with posterior lean with mobility, unaware and despite max cueing unable to correct    Exercises General Exercises - Lower Extremity Long Arc Quad: AROM;Both;10 reps;Seated Hip Flexion/Marching: AROM;Both;10 reps;Seated    General Comments        Pertinent Vitals/Pain Pain Assessment: No/denies pain    Home Living  Prior Function            PT Goals (current goals can now be found in the care plan section) Progress towards PT goals: Progressing toward goals    Frequency       PT Plan Current plan remains appropriate    Co-evaluation             End of Session Equipment Utilized During Treatment: Gait belt;Other  (comment) (sling RUE) Activity Tolerance: Patient tolerated treatment well Patient left: in chair;with call bell/phone within reach;with family/visitor present     Time: [id number] PT Time Calculation (min) (ACUTE ONLY): 39 min  Charges:  $Therapeutic Exercise: 8-22 mins $Therapeutic Activity: 8-22 mins $Self Care/Home Management: 8-22                    G Codes:      Melford Aase 2015/04/01, 2:44 PM Elwyn Reach, [place]

## 2015-03-23 NOTE — Progress Notes (Signed)
Occupational Therapy Treatment Patient Details Name: Jordan [name] MRN: [id number] DOB: [birth year] Today's Date: 03/23/2015    History of present illness 81 y.o. female s/p R reverse TSA.   OT comments  Pt doing better with transfers than during earlier PT visit, requiring min assist, daughter primarily assisting to gain confidence. Performed x 5 as pt needed to use commode x 2 and then wanted to return to bed. Performed active protocol exercises. Instructed daughter in sling use and positioning in bed and chair. At end of session, daughter feeling more comfortable with return home.  Follow Up Recommendations  Home health OT;Supervision/Assistance - 24 hour    Equipment Recommendations  None recommended by OT    Recommendations for Other Services      Precautions / Restrictions Precautions Precautions: Fall Precaution Comments: Active protocol.  FF 140, abd 60, ER 30.  Sling for sleep and comfort  Required Braces or Orthoses: Sling Restrictions Weight Bearing Restrictions: Yes RUE Weight Bearing: Non weight bearing       Mobility Bed Mobility Overal bed mobility: Needs Assistance Bed Mobility: Sit to Supine     Supine to sit: Mod assist     General bed mobility comments: assist for LEs, cues to avoid weight bearing on R UE  Transfers Overall transfer level: Needs assistance   Transfers: Sit to/from Stand;Stand Pivot Transfers Sit to Stand: Min assist Stand pivot transfers: Min assist       General transfer comment: assist to rise and gain balance, verbal and visual cues for hand placement, performed x 5 as pt used commode x 2 and wanted to return to bed    Balance Overall balance assessment: Needs assistance   Sitting balance-Leahy Scale: Fair   Postural control: Posterior lean   Standing balance-Leahy Scale: Poor                    ADL Overall ADL's : Needs assistance/impaired Eating/Feeding: Set up;Sitting Eating/Feeding Details (indicate cue  type and reason): used R hand with pillow propping elbow Grooming: Set up;Wash/dry hands;Sitting                   Toilet Transfer: Minimal Production assistant, radio Details (indicate cue type and reason): performed x 2 Toileting- Clothing Manipulation and Hygiene: Maximal assistance;Supervision/safety Toileting - Clothing Manipulation Details (indicate cue type and reason): max assist after BM, supervision after urinating       General ADL Comments: Daughter available today for education. Reviewed hemitechniques for bathing and dressing. Daughter participating in toileting activities and observing exercises and sling use.      Vision                     Perception     Praxis      Cognition   Behavior During Therapy: [facility] for tasks assessed/performed Overall Cognitive Status: Impaired/Different from baseline Area of Impairment: Following commands;Safety/judgement        Following Commands: Follows one step commands with increased time (multimodal cues) Safety/Judgement: Decreased awareness of safety     General Comments: visual and verbal cues for hand placement with sit to stand    Extremity/Trunk Assessment               Exercises  Shoulder Exercises Shoulder Flexion: AAROM;Right;10 reps;Seated (to 110) Shoulder ABduction: AAROM;Right;10 reps;Seated (to 60) Shoulder External Rotation: AAROM;Right;10 reps;Seated (to 30) Elbow Flexion: Right;10 reps;Seated;AROM Elbow Extension: AROM;Right;10 reps;Seated Wrist Flexion: Right;10 reps;AROM;Seated Wrist Extension: Right;10 reps;AROM;Seated Digit Composite  Flexion: Right;10 reps;AROM;Seated Composite Extension: AROM;Right;10 reps;Seated   Shoulder Instructions       General Comments      Pertinent Vitals/ Pain       Pain Assessment: Faces Faces Pain Scale: Hurts little more Pain Location: R shoulder with exercises Pain Descriptors / Indicators: Grimacing;Guarding Pain  Intervention(s): Limited activity within patient's tolerance;Monitored during session;Premedicated before session;Repositioned  Home Living                                          Prior Functioning/Environment              Frequency Min 2X/week     Progress Toward Goals  OT Goals(current goals can now be found in the care plan section)  Progress towards OT goals: Progressing toward goals  Acute Rehab OT Goals Patient Stated Goal: home Potential to Achieve Goals: Good  Plan Discharge plan remains appropriate    Co-evaluation                 End of Session Equipment Utilized During Treatment: Gait belt   Activity Tolerance Patient tolerated treatment well   Patient Left in bed;with call bell/phone within reach;with family/visitor present   Nurse Communication          Time: 1353-1440 OT Time Calculation (min): 47 min  Charges: OT General Charges $OT Visit: 1 Procedure OT Treatments $Self Care/Home Management : 23-37 mins $Therapeutic Exercise: 8-22 mins  Malka So 03/23/2015, 3:09 PM  [phone]

## 2015-03-24 LAB — URINALYSIS, ROUTINE W REFLEX MICROSCOPIC
Bilirubin Urine: NEGATIVE
Glucose, UA: NEGATIVE mg/dL
KETONES UR: NEGATIVE mg/dL
NITRITE: NEGATIVE
PH: 5.5 (ref 5.0–8.0)
PROTEIN: NEGATIVE mg/dL
Specific Gravity, Urine: 1.014 (ref 1.005–1.030)

## 2015-03-24 LAB — URINE MICROSCOPIC-ADD ON

## 2015-03-24 MED ORDER — CIPROFLOXACIN HCL 500 MG PO TABS: 500.0000 mg | ORAL_TABLET | Freq: Two times a day (BID) | ORAL | Status: DC

## 2015-03-24 MED ORDER — CIPROFLOXACIN HCL 500 MG PO TABS
500.0000 mg | ORAL_TABLET | Freq: Two times a day (BID) | ORAL | Status: DC
  Administered 2015-03-24: 500 mg via ORAL
  Filled 2015-03-24: qty 1

## 2015-03-24 NOTE — Progress Notes (Signed)
Orthopedics Progress Note  Subjective: Patient feeling better but some urinary urgency last night.   Objective:  Filed Vitals:   03/23/15 2116 03/24/15 0453  BP: 136/41 134/37  Pulse: 82 88  Temp: 98.3 F (36.8 C) 98.6 F (37 C)  Resp: 16 18    General: Awake and alert  Musculoskeletal: Right shoulder wound CDI, dressing in place, moving hand well min swelling Neurovascularly intact  Lab Results  Component Value Date   WBC 7.6 03/09/2015   HGB 9.4* 03/21/2015   HCT 30.2* 03/21/2015   MCV 101.6* 03/09/2015   PLT 182 03/09/2015       Component Value Date/Time   NA 135 03/21/2015 0309   K 5.0 03/21/2015 0309   CL 99* 03/21/2015 0309   CO2 30 03/21/2015 0309   GLUCOSE 122* 03/21/2015 0309   BUN 24* 03/21/2015 0309   CREATININE 1.33* 03/21/2015 0309   CREATININE 1.06* 02/27/2015 1535   CALCIUM 8.6* 03/21/2015 0309   GFRNONAA 33* 03/21/2015 0309   GFRNONAA 45* 02/27/2015 1535   GFRAA 39* 03/21/2015 0309   GFRAA 52* 02/27/2015 1535    Lab Results  Component Value Date   INR 1.06 08/29/2013   INR 0.99 07/01/2010   INR 0.92 06/30/2010    Assessment/Plan: POD #4 s/p Procedure(s): RIGHT REVERSE SHOULDER ARTHROPLASTY Due to urinary urgency and slightly increase leukocyte esterase, will start cipro for 5 days D/C home after OT today F/U two weeks  Doran Heater. Veverly Fells, MD 03/24/2015 6:09 AM

## 2015-03-24 NOTE — Progress Notes (Signed)
Orthopedic Tech Progress Note Patient Details:  Jordan [name] 12/26/1919 [id number]  Ortho Devices Type of Ortho Device: Arm sling Ortho Device/Splint Location: rue Ortho Device/Splint Interventions: Application   [name] 03/24/2015, 1:31 PM

## 2015-03-24 NOTE — Progress Notes (Signed)
Late Entry: Pt discharged with daughter at 55. Discharge instructions completed at 1340 with daughter and patient present. Patient daughter translating for patient and verbalized understanding. No further questions at this time. Volunteer services assisted patient to the discharge area in front of the hospital. Opie Fanton, Dione Plover

## 2015-03-24 NOTE — Care Management Note (Signed)
Case Management Note  Patient Details  Name: Jordan [name] MRN: [id number] Date of Birth: Aug 28, 1919  Subjective/Objective:         S/p right reverse total shoulder arthroplasty            Action/Plan: Spoke with patient, her daughter and her son about home health, they [facility]. Contacted Nitasha Jewel with [facility] and set up [place] [facility] [place]. No equipment needs identified. Family will be available to assist after discharge.    Expected Discharge Date:                  Expected Discharge Plan:  [place]  In-House Referral:  NA  Discharge planning Services  CM Consult  Post Acute Care Choice:  Home Health Choice offered to:  Adult Children  DME Arranged:    DME Agency:     HH Arranged:  PT, OT HH Agency:  [facility]  Status of Service:  Completed, signed off  Medicare Important Message Given:  Yes Date Medicare IM Given:    Medicare IM give by:    Date Additional Medicare IM Given:    Additional Medicare Important Message give by:     If discussed at [place] of Stay Meetings, dates discussed:    Additional Comments:  Nila Nephew, RN 03/24/2015, 9:57 AM

## 2015-03-24 NOTE — Discharge Summary (Signed)
Physician Discharge Summary   Patient ID: Jordan [name] MRN: [id number] DOB/AGE: 05-04-1919 81 y.o.  Admit date: 03/20/2015 Discharge date: 03/24/2015  Admission Diagnoses:  Active Problems:   S/P shoulder replacement   Discharge Diagnoses:  Same   Surgeries: Procedure(s): RIGHT REVERSE SHOULDER ARTHROPLASTY on 03/20/2015   Consultants: OT  Discharged Condition: Stable  Hospital Course: RENIYA MICHELINI is an 81 y.o. female who was admitted 03/20/2015 with a chief complaint of right shoulder pain, and found to have a diagnosis of right shoulder OA, RC insufficiency.  They were brought to the operating room on 03/20/2015 and underwent the above named procedures.    The patient had an uncomplicated hospital course and was stable for discharge.  Recent vital signs:  Filed Vitals:   03/23/15 2116 03/24/15 0453  BP: 136/41 134/37  Pulse: 82 88  Temp: 98.3 F (36.8 C) 98.6 F (37 C)  Resp: 16 18    Recent laboratory studies:  Results for orders placed or performed during the hospital encounter of 03/20/15  Hemoglobin and hematocrit, blood  Result Value Ref Range   Hemoglobin 9.4 (L) 12.0 - 15.0 g/dL   HCT 30.2 (L) 36.0 - [id number] %  Basic metabolic panel  Result Value Ref Range   Sodium 135 135 - 145 mmol/L   Potassium 5.0 3.5 - 5.1 mmol/L   Chloride 99 (L) 101 - 111 mmol/L   CO2 30 22 - 32 mmol/L   Glucose, Bld 122 (H) 65 - 99 mg/dL   BUN 24 (H) 6 - 20 mg/dL   Creatinine, Ser 1.33 (H) 0.44 - 1.00 mg/dL   Calcium 8.6 (L) 8.9 - 10.3 mg/dL   GFR calc non Af Amer 33 (L) >60 mL/min   GFR calc Af Amer 39 (L) >60 mL/min   Anion gap 6 5 - 15  Urinalysis, Routine w reflex microscopic (not at [facility])  Result Value Ref Range   Color, Urine YELLOW YELLOW   APPearance CLOUDY (A) CLEAR   Specific Gravity, Urine 1.014 1.005 - 1.030   pH 5.5 5.0 - 8.0   Glucose, UA NEGATIVE NEGATIVE mg/dL   Hgb urine dipstick SMALL (A) NEGATIVE   Bilirubin Urine NEGATIVE NEGATIVE   Ketones, ur  NEGATIVE NEGATIVE mg/dL   Protein, ur NEGATIVE NEGATIVE mg/dL   Nitrite NEGATIVE NEGATIVE   Leukocytes, UA TRACE (A) NEGATIVE  Urine microscopic-add on  Result Value Ref Range   Squamous Epithelial / LPF 0-5 (A) NONE SEEN   WBC, UA 6-30 0 - 5 WBC/hpf   RBC / HPF 0-5 0 - 5 RBC/hpf   Bacteria, UA RARE (A) NONE SEEN   Casts HYALINE CASTS (A) NEGATIVE  Type and screen [place]  Result Value Ref Range   ABO/RH(D) O POS    Antibody Screen NEG    Sample Expiration 03/23/2015   ABO/Rh  Result Value Ref Range   ABO/RH(D) O POS     Discharge Medications:     Medication List    STOP taking these medications        diclofenac sodium 1 % Gel  Commonly known as:  VOLTAREN      TAKE these medications        acetaminophen 325 MG tablet  Commonly known as:  TYLENOL  Take 650 mg by mouth every 6 (six) hours as needed for mild pain.     allopurinol 100 MG tablet  Commonly known as:  ZYLOPRIM  Take 1 tablet (100 mg total) by  mouth daily.     beta carotene w/minerals tablet  Take 1 tablet by mouth daily.     CALTRATE 600+D 600-400 MG-UNIT tablet  Generic drug:  Calcium Carbonate-Vitamin D  Take 1 tablet by mouth 2 (two) times daily.     ciprofloxacin 500 MG tablet  Commonly known as:  CIPRO  Take 1 tablet (500 mg total) by mouth 2 (two) times daily.     colchicine 0.6 MG tablet  Take 0.6 mg by mouth 2 (two) times daily as needed (GOUT).     cycloSPORINE 0.05 % ophthalmic emulsion  Commonly known as:  RESTASIS  Place 1 drop into both eyes at bedtime.     DULoxetine 60 MG capsule  Commonly known as:  CYMBALTA  take 1 capsule by mouth once daily     esomeprazole 40 MG capsule  Commonly known as:  NEXIUM  Take 1 capsule (40 mg total) by mouth 2 (two) times daily before a meal.     furosemide 20 MG tablet  Commonly known as:  LASIX  Take 1 tablet (20 mg total) by mouth daily.     gabapentin 100 MG capsule  Commonly known as:  NEURONTIN  take 2  capsules by mouth three times a day     ibuprofen 100 MG chewable tablet  Commonly known as:  ADVIL,MOTRIN  Chew 100-200 mg by mouth every 8 (eight) hours as needed (for pain.).     levothyroxine 100 MCG tablet  Commonly known as:  SYNTHROID, LEVOTHROID  take 1 tablet by mouth once daily     lidocaine 5 % ointment  Commonly known as:  XYLOCAINE  apply to affected area once daily if needed DO NOT EXCEED 5 GRAMS OF OINTMENT     losartan 50 MG tablet  Commonly known as:  COZAAR  Take 50 mg by mouth daily.     oxyCODONE-acetaminophen 5-325 MG tablet  Commonly known as:  PERCOCET/ROXICET  take 1 tablet by mouth every 6 hours if needed for severe pain     oxyCODONE-acetaminophen 5-325 MG tablet  Commonly known as:  ROXICET  Take 1-2 tablets by mouth every 4 (four) hours as needed for severe pain.     polyethylene glycol packet  Commonly known as:  MIRALAX  Take 17 g by mouth daily.     RA ASPIRIN EC 81 MG EC tablet  Generic drug:  aspirin  take 1 tablet by mouth once daily     RA COL-RITE 100 MG capsule  Generic drug:  docusate sodium  take 1 capsule by mouth once daily if needed     sucralfate 1 G tablet  Commonly known as:  CARAFATE  Take 1 tablet (1 g total) by mouth 4 (four) times daily as needed.     TOVIAZ 8 MG Tb24 tablet  Generic drug:  fesoterodine  Take 8 mg by mouth daily.     TRAVATAN Z 0.004 % Soln ophthalmic solution  Generic drug:  Travoprost (BAK Free)  Place 1 drop into both eyes at bedtime.     traZODone 50 MG tablet  Commonly known as:  DESYREL  Take 1 tablet (50 mg total) by mouth at bedtime.     VIVLODEX 10 MG Caps  Generic drug:  Meloxicam  Take 10 mg by mouth daily as needed. For pain.        Diagnostic Studies: X-ray Chest Pa Or Ap  03/20/2015  CLINICAL DATA:  Pre-op images for right shoulder arthro plasty today; recent  fall, language barrier present, daughter translating, no current chest complaints EXAM: CHEST 1 VIEW COMPARISON:   03/16/2015 FINDINGS: Cardiac silhouette normal in size and configuration. No mediastinal or hilar masses or evidence of adenopathy. Left anterior chest wall sequential pacemaker is stable in well positioned. There are prominent bronchovascular markings, stable. No evidence of pneumonia or edema. No pleural effusion or pneumothorax. Skeletal structures are demineralized. There arthropathic changes of the right shoulder. IMPRESSION: 1. No acute cardiopulmonary disease. No change from the prior study. Electronically Signed   By: Lajean Manes M.D.   On: 03/20/2015 10:53   Dg Ribs Unilateral Left  03/16/2015  CLINICAL DATA:  Diffuse left-sided rib pain after fall today fall walking to the bathroom with her walker. EXAM: LEFT RIBS - 2 VIEW COMPARISON:  None. FINDINGS: The cortical margins of the left ribs are intact. No fracture or destructive rib lesion. Mild cardiomegaly with atherosclerosis of the thoracic aorta. No pulmonary edema, pleural effusion, pneumothorax or confluent airspace disease. Advanced degenerative change of the right shoulder. Scoliosis in the thoracic spine with associated degenerative change. IMPRESSION: No evidence of left rib fracture. Electronically Signed   By: Jeb Levering M.D.   On: 03/16/2015 00:24   X-ray Cervical Spine Ap And Lateral  03/20/2015  CLINICAL DATA:  Cervical spine pain. Patient for right shoulder replacement. Subsequent encounter. EXAM: CERVICAL SPINE - 2-3 VIEW COMPARISON:  CT cervical spine 02/08/2013. FINDINGS: Vertebral body height is maintained. There is marked loss of disc space height at C5-6 and C6-7. Facet mediated grade 1 anterolisthesis C5 on C6 is unchanged. There is mild kyphosis about the C6 level. Multilevel facet degenerative disease is identified. The lung apices demonstrate scarring. IMPRESSION: No acute abnormality. Marked spondylosis C5-C7 appears unchanged. Electronically Signed   By: Inge Rise M.D.   On: 03/20/2015 10:54   Dg Shoulder  Right  03/20/2015  CLINICAL DATA:  Pre-op images for right shoulder arthro plasty today; recent fall, language barrier present, daughter translating, no current chest complaints. EXAM: RIGHT SHOULDER - 2+ VIEW COMPARISON:  None. FINDINGS: There is no fracture or dislocation. The glenohumeral joint is significantly narrowed mostly along its inferior margin, with associated subchondral sclerosis and small marginal osteophytes. There are moderate AC joint osteoarthritic changes. Bones are diffusely demineralized.  Soft tissues are unremarkable. IMPRESSION: 1. No fracture or dislocation. 2. Prominent arthropathic changes of the glenohumeral joint. 3. Moderate AC joint osteoarthritis. Electronically Signed   By: Lajean Manes M.D.   On: 03/20/2015 10:54   Dg Tibia/fibula Left  03/16/2015  CLINICAL DATA:  Left lower leg pain. Fall today while walking to the bathroom with her walker. EXAM: LEFT TIBIA AND FIBULA - 2 VIEW COMPARISON:  None. FINDINGS: Cortical margins of the tibia and fibula are intact. There is no fracture. Knee and ankle alignment are maintained. The bones are under mineralized. There are vascular calcifications. No focal soft tissue abnormality. IMPRESSION: No fracture or dislocation of the left lower leg. Electronically Signed   By: Jeb Levering M.D.   On: 03/16/2015 00:21   Dg Shoulder Right Port  03/20/2015  CLINICAL DATA:  Shoulder replacement surgery EXAM: PORTABLE RIGHT SHOULDER - 2+ VIEW COMPARISON:  03/20/2015 FINDINGS: Humeral and scapular components of right shoulder arthroplasty project in expected location. No fracture or dislocation. IMPRESSION: 1. Right shoulder arthroplasty without apparent complication Electronically Signed   By: Lucrezia Europe M.D.   On: 03/20/2015 15:13    Disposition: 01-Home or Self Care  Follow-up Information    Follow up with Zamia Tyminski,STEVEN R, MD. Call in 2 weeks.   Specialty:  Orthopedic Surgery   Why:  [phone]   Contact information:    [address] [place] [address] [phone]       Follow up with [facility].   Why:  They will contact you to schedule home therapy visits.    Contact information:   [place] [address] [place] [address] [phone]        Signed: Augustin Schooling 03/24/2015, 6:13 AM

## 2015-03-24 NOTE — Progress Notes (Signed)
Occupational Therapy Treatment Patient Details Name: Jordan [name] MRN: [id number] DOB: 1919/05/17 Today's Date: 03/24/2015    History of present illness 81 y.o. female s/p R reverse TSA.   OT comments  Family return demonstrates exercises with much encouragement from therapist to participate to prepare for home. Son present and able to complete task and handout present in room. Pt ready to d/c home and in chair with family present. Rn providing d/c information.   Follow Up Recommendations  Home health OT;Supervision/Assistance - 24 hour    Equipment Recommendations  None recommended by OT    Recommendations for Other Services      Precautions / Restrictions Precautions Precautions: Fall Precaution Comments: Active protocol.  FF 140, abd 60, ER 30.  Sling for sleep and comfort  Required Braces or Orthoses: Sling Restrictions RUE Weight Bearing: Non weight bearing       Mobility Bed Mobility               General bed mobility comments: in chair on arrival  Transfers                      Balance                                   ADL Overall ADL's : Needs assistance/impaired                 Upper Body Dressing : Moderate assistance Upper Body Dressing Details (indicate cue type and reason): Reeducated family on dressing the R UE first  Lower Body Dressing: Maximal assistance Lower Body Dressing Details (indicate cue type and reason): therapist threading bil LE into pants.                General ADL Comments: daughter son and additional family member present. Therapist reinforcign the need for family to practice exercises. Daughter states "no I am tired you do it" Therapist again educating on family must be able to complete exercises at home and to practice today to ensure they are completed at home. Daughter states "no no i no told I do it . You do it please I am tired you do it" Patients son completing the exercises and speakign to  patient in [place]. After shoulder exercises complete daughter insist therapist do a complete session. Daughter again educated that OT focuses on dressing bathing and daily activities with examples provided. OT offered to help (A) in dressing patient for home. Daughter states no just do the therapy. OT again explaining the difference between OT and PT and the purpose of todays session. Pt c/o tightness with R shoulder dressing. Pt and daughter advised not to remove not to place vasculine near bandage and that the MD wants dressing to remain in place until follow up. RN present and helping provide d/c recommendations. Pt with reaction to tape in the past. Daughter advised if pt states it itches scratching at bandage or the skin looks red to call MD immediately. Pt dressed and in chair ready for d/c at end of OT session. Family with no further questions.      Vision                     Perception     Praxis      Cognition   Behavior During Therapy: [facility] for tasks assessed/performed Overall Cognitive Status: Impaired/Different from baseline Area of  Impairment: Following commands     Memory: Decreased short-term memory  Following Commands: Follows one step commands with increased time       General Comments: family providing multimodal cues with slow response to commands. therapist reinforcing education with the family.     Extremity/Trunk Assessment               Exercises Shoulder Exercises Shoulder Flexion: AAROM;Right;10 reps;Seated Shoulder ABduction: AAROM;Right;10 reps;Seated Shoulder External Rotation: AAROM;Right;10 reps;Seated Elbow Flexion: Right;10 reps;Seated;AROM Elbow Extension: AROM;Right;10 reps;Seated Wrist Flexion: Right;10 reps;AROM;Seated Wrist Extension: Right;10 reps;AROM;Seated Digit Composite Flexion: Right;10 reps;AROM;Seated Composite Extension: AROM;Right;10 reps;Seated Donning/doffing shirt without moving shoulder: Moderate assistance (AROM  allowed) ROM for elbow, wrist and digits of operated UE: Minimal assistance (son (A)ing)   Shoulder Instructions Shoulder Instructions Donning/doffing shirt without moving shoulder: Moderate assistance (AROM allowed) ROM for elbow, wrist and digits of operated UE: Minimal assistance (son (A)ing)     General Comments      Pertinent Vitals/ Pain       Pain Assessment: Faces Faces Pain Scale: Hurts little more Pain Location: R shoulder with exercises Pain Descriptors / Indicators: Grimacing Pain Intervention(s): Premedicated before session;Repositioned  Home Living                                          Prior Functioning/Environment              Frequency Min 2X/week     Progress Toward Goals  OT Goals(current goals can now be found in the care plan section)  Progress towards OT goals: Progressing toward goals  Acute Rehab OT Goals Patient Stated Goal: to go home today soon very soon per daughter OT Goal Formulation: With patient/family Time For Goal Achievement: 03/28/15 Potential to Achieve Goals: Good ADL Goals Pt/caregiver will Perform Home Exercise Program: Increased ROM;Right Upper extremity;With minimal assist Additional ADL Goal #1: Pt/caregivers will be independent in positioning R UE in bed, chair and sling. Additional ADL Goal #2: Pt/caregiver will verbalize understanding of hemitechniques for bathing and dressing.  Plan Discharge plan remains appropriate    Co-evaluation                 End of Session     Activity Tolerance Patient tolerated treatment well   Patient Left in chair;with call bell/phone within reach;with nursing/sitter in room;with family/visitor present   Nurse Communication          Time: [id number] OT Time Calculation (min): 28 min  Charges: OT General Charges $OT Visit: 1 Procedure OT Treatments $Self Care/Home Management : 8-22 mins $Therapeutic Exercise: 8-22 mins  Parke Poisson B 03/24/2015,  1:27 PM   Jeri Modena   OTR/L Pager: [phone] Office: [phone] .

## 2015-03-25 DIAGNOSIS — M4806 Spinal stenosis, lumbar region: Secondary | ICD-10-CM | POA: Diagnosis not present

## 2015-03-25 DIAGNOSIS — G309 Alzheimer's disease, unspecified: Secondary | ICD-10-CM | POA: Diagnosis not present

## 2015-03-25 DIAGNOSIS — M159 Polyosteoarthritis, unspecified: Secondary | ICD-10-CM | POA: Diagnosis not present

## 2015-03-25 DIAGNOSIS — F0280 Dementia in other diseases classified elsewhere without behavioral disturbance: Secondary | ICD-10-CM | POA: Diagnosis not present

## 2015-03-25 DIAGNOSIS — G894 Chronic pain syndrome: Secondary | ICD-10-CM | POA: Diagnosis not present

## 2015-03-25 DIAGNOSIS — Z471 Aftercare following joint replacement surgery: Secondary | ICD-10-CM | POA: Diagnosis not present

## 2015-03-27 DIAGNOSIS — M159 Polyosteoarthritis, unspecified: Secondary | ICD-10-CM | POA: Diagnosis not present

## 2015-03-27 DIAGNOSIS — M4806 Spinal stenosis, lumbar region: Secondary | ICD-10-CM | POA: Diagnosis not present

## 2015-03-27 DIAGNOSIS — G309 Alzheimer's disease, unspecified: Secondary | ICD-10-CM | POA: Diagnosis not present

## 2015-03-27 DIAGNOSIS — G894 Chronic pain syndrome: Secondary | ICD-10-CM | POA: Diagnosis not present

## 2015-03-27 DIAGNOSIS — F0280 Dementia in other diseases classified elsewhere without behavioral disturbance: Secondary | ICD-10-CM | POA: Diagnosis not present

## 2015-03-27 DIAGNOSIS — Z471 Aftercare following joint replacement surgery: Secondary | ICD-10-CM | POA: Diagnosis not present

## 2015-03-29 DIAGNOSIS — G309 Alzheimer's disease, unspecified: Secondary | ICD-10-CM | POA: Diagnosis not present

## 2015-03-29 DIAGNOSIS — M4806 Spinal stenosis, lumbar region: Secondary | ICD-10-CM | POA: Diagnosis not present

## 2015-03-29 DIAGNOSIS — G894 Chronic pain syndrome: Secondary | ICD-10-CM | POA: Diagnosis not present

## 2015-03-29 DIAGNOSIS — F0280 Dementia in other diseases classified elsewhere without behavioral disturbance: Secondary | ICD-10-CM | POA: Diagnosis not present

## 2015-03-29 DIAGNOSIS — Z471 Aftercare following joint replacement surgery: Secondary | ICD-10-CM | POA: Diagnosis not present

## 2015-03-29 DIAGNOSIS — M159 Polyosteoarthritis, unspecified: Secondary | ICD-10-CM | POA: Diagnosis not present

## 2015-03-30 ENCOUNTER — Telehealth: Payer: Self-pay | Admitting: Internal Medicine

## 2015-03-30 NOTE — Telephone Encounter (Signed)
Will forward to PCP for review of verbal orders. Sharia Averitt, CMA.

## 2015-03-30 NOTE — Telephone Encounter (Signed)
Levada Dy, RN with [facility] calls requesting verbal orders for management of high blood pressue (readings @ home 180/62) and education on pain management. Please call Levada Dy with verbals @ [phone].

## 2015-03-31 DIAGNOSIS — G309 Alzheimer's disease, unspecified: Secondary | ICD-10-CM | POA: Diagnosis not present

## 2015-03-31 DIAGNOSIS — Z471 Aftercare following joint replacement surgery: Secondary | ICD-10-CM | POA: Diagnosis not present

## 2015-03-31 DIAGNOSIS — M159 Polyosteoarthritis, unspecified: Secondary | ICD-10-CM | POA: Diagnosis not present

## 2015-03-31 DIAGNOSIS — F0280 Dementia in other diseases classified elsewhere without behavioral disturbance: Secondary | ICD-10-CM | POA: Diagnosis not present

## 2015-03-31 DIAGNOSIS — G894 Chronic pain syndrome: Secondary | ICD-10-CM | POA: Diagnosis not present

## 2015-03-31 DIAGNOSIS — M4806 Spinal stenosis, lumbar region: Secondary | ICD-10-CM | POA: Diagnosis not present

## 2015-04-02 DIAGNOSIS — G309 Alzheimer's disease, unspecified: Secondary | ICD-10-CM | POA: Diagnosis not present

## 2015-04-02 DIAGNOSIS — F0280 Dementia in other diseases classified elsewhere without behavioral disturbance: Secondary | ICD-10-CM | POA: Diagnosis not present

## 2015-04-02 DIAGNOSIS — G894 Chronic pain syndrome: Secondary | ICD-10-CM | POA: Diagnosis not present

## 2015-04-02 DIAGNOSIS — Z96611 Presence of right artificial shoulder joint: Secondary | ICD-10-CM | POA: Diagnosis not present

## 2015-04-02 DIAGNOSIS — M159 Polyosteoarthritis, unspecified: Secondary | ICD-10-CM | POA: Diagnosis not present

## 2015-04-02 DIAGNOSIS — Z471 Aftercare following joint replacement surgery: Secondary | ICD-10-CM | POA: Diagnosis not present

## 2015-04-02 DIAGNOSIS — M4806 Spinal stenosis, lumbar region: Secondary | ICD-10-CM | POA: Diagnosis not present

## 2015-04-03 DIAGNOSIS — M4806 Spinal stenosis, lumbar region: Secondary | ICD-10-CM | POA: Diagnosis not present

## 2015-04-03 DIAGNOSIS — G309 Alzheimer's disease, unspecified: Secondary | ICD-10-CM | POA: Diagnosis not present

## 2015-04-03 DIAGNOSIS — F0280 Dementia in other diseases classified elsewhere without behavioral disturbance: Secondary | ICD-10-CM | POA: Diagnosis not present

## 2015-04-03 DIAGNOSIS — Z471 Aftercare following joint replacement surgery: Secondary | ICD-10-CM | POA: Diagnosis not present

## 2015-04-03 DIAGNOSIS — M159 Polyosteoarthritis, unspecified: Secondary | ICD-10-CM | POA: Diagnosis not present

## 2015-04-03 DIAGNOSIS — G894 Chronic pain syndrome: Secondary | ICD-10-CM | POA: Diagnosis not present

## 2015-04-06 DIAGNOSIS — G894 Chronic pain syndrome: Secondary | ICD-10-CM | POA: Diagnosis not present

## 2015-04-06 DIAGNOSIS — M4806 Spinal stenosis, lumbar region: Secondary | ICD-10-CM | POA: Diagnosis not present

## 2015-04-06 DIAGNOSIS — Z471 Aftercare following joint replacement surgery: Secondary | ICD-10-CM | POA: Diagnosis not present

## 2015-04-06 DIAGNOSIS — F0280 Dementia in other diseases classified elsewhere without behavioral disturbance: Secondary | ICD-10-CM | POA: Diagnosis not present

## 2015-04-06 DIAGNOSIS — M159 Polyosteoarthritis, unspecified: Secondary | ICD-10-CM | POA: Diagnosis not present

## 2015-04-06 DIAGNOSIS — G309 Alzheimer's disease, unspecified: Secondary | ICD-10-CM | POA: Diagnosis not present

## 2015-04-07 DIAGNOSIS — G894 Chronic pain syndrome: Secondary | ICD-10-CM | POA: Diagnosis not present

## 2015-04-07 DIAGNOSIS — Z471 Aftercare following joint replacement surgery: Secondary | ICD-10-CM | POA: Diagnosis not present

## 2015-04-07 DIAGNOSIS — M4806 Spinal stenosis, lumbar region: Secondary | ICD-10-CM | POA: Diagnosis not present

## 2015-04-07 DIAGNOSIS — G309 Alzheimer's disease, unspecified: Secondary | ICD-10-CM | POA: Diagnosis not present

## 2015-04-07 DIAGNOSIS — F0280 Dementia in other diseases classified elsewhere without behavioral disturbance: Secondary | ICD-10-CM | POA: Diagnosis not present

## 2015-04-07 DIAGNOSIS — M159 Polyosteoarthritis, unspecified: Secondary | ICD-10-CM | POA: Diagnosis not present

## 2015-04-08 DIAGNOSIS — H401133 Primary open-angle glaucoma, bilateral, severe stage: Secondary | ICD-10-CM | POA: Diagnosis not present

## 2015-04-09 DIAGNOSIS — G894 Chronic pain syndrome: Secondary | ICD-10-CM | POA: Diagnosis not present

## 2015-04-09 DIAGNOSIS — G309 Alzheimer's disease, unspecified: Secondary | ICD-10-CM | POA: Diagnosis not present

## 2015-04-09 DIAGNOSIS — Z950 Presence of cardiac pacemaker: Secondary | ICD-10-CM | POA: Diagnosis not present

## 2015-04-09 DIAGNOSIS — Z471 Aftercare following joint replacement surgery: Secondary | ICD-10-CM | POA: Diagnosis not present

## 2015-04-09 DIAGNOSIS — F0280 Dementia in other diseases classified elsewhere without behavioral disturbance: Secondary | ICD-10-CM | POA: Diagnosis not present

## 2015-04-09 DIAGNOSIS — I5032 Chronic diastolic (congestive) heart failure: Secondary | ICD-10-CM | POA: Diagnosis not present

## 2015-04-09 DIAGNOSIS — I10 Essential (primary) hypertension: Secondary | ICD-10-CM | POA: Diagnosis not present

## 2015-04-09 DIAGNOSIS — M4806 Spinal stenosis, lumbar region: Secondary | ICD-10-CM | POA: Diagnosis not present

## 2015-04-09 DIAGNOSIS — M159 Polyosteoarthritis, unspecified: Secondary | ICD-10-CM | POA: Diagnosis not present

## 2015-04-10 DIAGNOSIS — M4806 Spinal stenosis, lumbar region: Secondary | ICD-10-CM | POA: Diagnosis not present

## 2015-04-10 DIAGNOSIS — G894 Chronic pain syndrome: Secondary | ICD-10-CM | POA: Diagnosis not present

## 2015-04-10 DIAGNOSIS — G309 Alzheimer's disease, unspecified: Secondary | ICD-10-CM | POA: Diagnosis not present

## 2015-04-10 DIAGNOSIS — M159 Polyosteoarthritis, unspecified: Secondary | ICD-10-CM | POA: Diagnosis not present

## 2015-04-10 DIAGNOSIS — F0280 Dementia in other diseases classified elsewhere without behavioral disturbance: Secondary | ICD-10-CM | POA: Diagnosis not present

## 2015-04-10 DIAGNOSIS — Z471 Aftercare following joint replacement surgery: Secondary | ICD-10-CM | POA: Diagnosis not present

## 2015-04-13 ENCOUNTER — Other Ambulatory Visit: Payer: Self-pay | Admitting: Internal Medicine

## 2015-04-14 DIAGNOSIS — F0280 Dementia in other diseases classified elsewhere without behavioral disturbance: Secondary | ICD-10-CM | POA: Diagnosis not present

## 2015-04-14 DIAGNOSIS — G309 Alzheimer's disease, unspecified: Secondary | ICD-10-CM | POA: Diagnosis not present

## 2015-04-14 DIAGNOSIS — Z471 Aftercare following joint replacement surgery: Secondary | ICD-10-CM | POA: Diagnosis not present

## 2015-04-14 DIAGNOSIS — M4806 Spinal stenosis, lumbar region: Secondary | ICD-10-CM | POA: Diagnosis not present

## 2015-04-14 DIAGNOSIS — G894 Chronic pain syndrome: Secondary | ICD-10-CM | POA: Diagnosis not present

## 2015-04-14 DIAGNOSIS — M159 Polyosteoarthritis, unspecified: Secondary | ICD-10-CM | POA: Diagnosis not present

## 2015-04-15 DIAGNOSIS — M159 Polyosteoarthritis, unspecified: Secondary | ICD-10-CM | POA: Diagnosis not present

## 2015-04-15 DIAGNOSIS — G309 Alzheimer's disease, unspecified: Secondary | ICD-10-CM | POA: Diagnosis not present

## 2015-04-15 DIAGNOSIS — F0280 Dementia in other diseases classified elsewhere without behavioral disturbance: Secondary | ICD-10-CM | POA: Diagnosis not present

## 2015-04-15 DIAGNOSIS — G894 Chronic pain syndrome: Secondary | ICD-10-CM | POA: Diagnosis not present

## 2015-04-15 DIAGNOSIS — M4806 Spinal stenosis, lumbar region: Secondary | ICD-10-CM | POA: Diagnosis not present

## 2015-04-15 DIAGNOSIS — Z471 Aftercare following joint replacement surgery: Secondary | ICD-10-CM | POA: Diagnosis not present

## 2015-04-15 NOTE — Addendum Note (Signed)
Addendum  created 04/15/15 0700 by Kate Sable, MD   Modules edited: Anesthesia Blocks and Procedures, Clinical Notes   Clinical Notes:  File: [id number]

## 2015-04-16 DIAGNOSIS — M4806 Spinal stenosis, lumbar region: Secondary | ICD-10-CM | POA: Diagnosis not present

## 2015-04-16 DIAGNOSIS — G309 Alzheimer's disease, unspecified: Secondary | ICD-10-CM | POA: Diagnosis not present

## 2015-04-16 DIAGNOSIS — F0280 Dementia in other diseases classified elsewhere without behavioral disturbance: Secondary | ICD-10-CM | POA: Diagnosis not present

## 2015-04-16 DIAGNOSIS — Z471 Aftercare following joint replacement surgery: Secondary | ICD-10-CM | POA: Diagnosis not present

## 2015-04-16 DIAGNOSIS — G894 Chronic pain syndrome: Secondary | ICD-10-CM | POA: Diagnosis not present

## 2015-04-16 DIAGNOSIS — M159 Polyosteoarthritis, unspecified: Secondary | ICD-10-CM | POA: Diagnosis not present

## 2015-04-20 DIAGNOSIS — G894 Chronic pain syndrome: Secondary | ICD-10-CM | POA: Diagnosis not present

## 2015-04-20 DIAGNOSIS — Z471 Aftercare following joint replacement surgery: Secondary | ICD-10-CM | POA: Diagnosis not present

## 2015-04-20 DIAGNOSIS — M159 Polyosteoarthritis, unspecified: Secondary | ICD-10-CM | POA: Diagnosis not present

## 2015-04-20 DIAGNOSIS — F0280 Dementia in other diseases classified elsewhere without behavioral disturbance: Secondary | ICD-10-CM | POA: Diagnosis not present

## 2015-04-20 DIAGNOSIS — G309 Alzheimer's disease, unspecified: Secondary | ICD-10-CM | POA: Diagnosis not present

## 2015-04-20 DIAGNOSIS — M4806 Spinal stenosis, lumbar region: Secondary | ICD-10-CM | POA: Diagnosis not present

## 2015-04-21 DIAGNOSIS — M159 Polyosteoarthritis, unspecified: Secondary | ICD-10-CM | POA: Diagnosis not present

## 2015-04-21 DIAGNOSIS — M4806 Spinal stenosis, lumbar region: Secondary | ICD-10-CM | POA: Diagnosis not present

## 2015-04-21 DIAGNOSIS — Z471 Aftercare following joint replacement surgery: Secondary | ICD-10-CM | POA: Diagnosis not present

## 2015-04-21 DIAGNOSIS — G894 Chronic pain syndrome: Secondary | ICD-10-CM | POA: Diagnosis not present

## 2015-04-21 DIAGNOSIS — F0280 Dementia in other diseases classified elsewhere without behavioral disturbance: Secondary | ICD-10-CM | POA: Diagnosis not present

## 2015-04-21 DIAGNOSIS — G309 Alzheimer's disease, unspecified: Secondary | ICD-10-CM | POA: Diagnosis not present

## 2015-04-25 ENCOUNTER — Other Ambulatory Visit: Payer: Self-pay | Admitting: Internal Medicine

## 2015-04-29 DIAGNOSIS — M159 Polyosteoarthritis, unspecified: Secondary | ICD-10-CM | POA: Diagnosis not present

## 2015-04-29 DIAGNOSIS — F0280 Dementia in other diseases classified elsewhere without behavioral disturbance: Secondary | ICD-10-CM | POA: Diagnosis not present

## 2015-04-29 DIAGNOSIS — M4806 Spinal stenosis, lumbar region: Secondary | ICD-10-CM | POA: Diagnosis not present

## 2015-04-29 DIAGNOSIS — G894 Chronic pain syndrome: Secondary | ICD-10-CM | POA: Diagnosis not present

## 2015-04-29 DIAGNOSIS — G309 Alzheimer's disease, unspecified: Secondary | ICD-10-CM | POA: Diagnosis not present

## 2015-04-29 DIAGNOSIS — Z471 Aftercare following joint replacement surgery: Secondary | ICD-10-CM | POA: Diagnosis not present

## 2015-04-30 ENCOUNTER — Ambulatory Visit: Payer: Medicare Other | Admitting: Family Medicine

## 2015-04-30 DIAGNOSIS — Z96611 Presence of right artificial shoulder joint: Secondary | ICD-10-CM | POA: Diagnosis not present

## 2015-04-30 DIAGNOSIS — M4806 Spinal stenosis, lumbar region: Secondary | ICD-10-CM | POA: Diagnosis not present

## 2015-04-30 DIAGNOSIS — G894 Chronic pain syndrome: Secondary | ICD-10-CM | POA: Diagnosis not present

## 2015-04-30 DIAGNOSIS — M159 Polyosteoarthritis, unspecified: Secondary | ICD-10-CM | POA: Diagnosis not present

## 2015-04-30 DIAGNOSIS — G309 Alzheimer's disease, unspecified: Secondary | ICD-10-CM | POA: Diagnosis not present

## 2015-04-30 DIAGNOSIS — F0280 Dementia in other diseases classified elsewhere without behavioral disturbance: Secondary | ICD-10-CM | POA: Diagnosis not present

## 2015-04-30 DIAGNOSIS — Z471 Aftercare following joint replacement surgery: Secondary | ICD-10-CM | POA: Diagnosis not present

## 2015-05-01 DIAGNOSIS — F0280 Dementia in other diseases classified elsewhere without behavioral disturbance: Secondary | ICD-10-CM | POA: Diagnosis not present

## 2015-05-01 DIAGNOSIS — M4806 Spinal stenosis, lumbar region: Secondary | ICD-10-CM | POA: Diagnosis not present

## 2015-05-01 DIAGNOSIS — G894 Chronic pain syndrome: Secondary | ICD-10-CM | POA: Diagnosis not present

## 2015-05-01 DIAGNOSIS — G309 Alzheimer's disease, unspecified: Secondary | ICD-10-CM | POA: Diagnosis not present

## 2015-05-01 DIAGNOSIS — Z471 Aftercare following joint replacement surgery: Secondary | ICD-10-CM | POA: Diagnosis not present

## 2015-05-01 DIAGNOSIS — M159 Polyosteoarthritis, unspecified: Secondary | ICD-10-CM | POA: Diagnosis not present

## 2015-05-02 ENCOUNTER — Other Ambulatory Visit: Payer: Self-pay | Admitting: Family Medicine

## 2015-05-05 ENCOUNTER — Ambulatory Visit (INDEPENDENT_AMBULATORY_CARE_PROVIDER_SITE_OTHER): Payer: Medicare Other | Admitting: Family Medicine

## 2015-05-05 ENCOUNTER — Telehealth: Payer: Self-pay | Admitting: *Deleted

## 2015-05-05 ENCOUNTER — Encounter: Payer: Self-pay | Admitting: Family Medicine

## 2015-05-05 VITALS — BP 180/88 | HR 86 | Wt 137.0 lb

## 2015-05-05 DIAGNOSIS — R3915 Urgency of urination: Secondary | ICD-10-CM

## 2015-05-05 DIAGNOSIS — M159 Polyosteoarthritis, unspecified: Secondary | ICD-10-CM | POA: Diagnosis not present

## 2015-05-05 DIAGNOSIS — D649 Anemia, unspecified: Secondary | ICD-10-CM | POA: Diagnosis not present

## 2015-05-05 DIAGNOSIS — G309 Alzheimer's disease, unspecified: Secondary | ICD-10-CM | POA: Diagnosis not present

## 2015-05-05 DIAGNOSIS — G894 Chronic pain syndrome: Secondary | ICD-10-CM | POA: Diagnosis not present

## 2015-05-05 DIAGNOSIS — F0280 Dementia in other diseases classified elsewhere without behavioral disturbance: Secondary | ICD-10-CM | POA: Diagnosis not present

## 2015-05-05 DIAGNOSIS — Z471 Aftercare following joint replacement surgery: Secondary | ICD-10-CM | POA: Diagnosis not present

## 2015-05-05 DIAGNOSIS — M4806 Spinal stenosis, lumbar region: Secondary | ICD-10-CM | POA: Diagnosis not present

## 2015-05-05 LAB — POCT URINALYSIS DIPSTICK
BILIRUBIN UA: NEGATIVE
Glucose, UA: NEGATIVE
KETONES UA: NEGATIVE
Nitrite, UA: NEGATIVE
PROTEIN UA: 30
Spec Grav, UA: 1.015
Urobilinogen, UA: 0.2
pH, UA: 7

## 2015-05-05 LAB — POCT UA - MICROSCOPIC ONLY

## 2015-05-05 LAB — POCT HEMOGLOBIN: Hemoglobin: 9.9 g/dL — AB (ref 12.2–16.2)

## 2015-05-05 MED ORDER — CEPHALEXIN 500 MG PO CAPS: 500.0000 mg | ORAL_CAPSULE | Freq: Three times a day (TID) | ORAL | Status: DC

## 2015-05-05 NOTE — Progress Notes (Signed)
   Subjective:    Patient ID: [name], female    DOB: May 31, 1919, 82 y.o.   MRN: [id number]  HPI  Patient presents for Same Day Appointment  CC: urinary frequency  # Urinary frequency:  Started 2-3 days ago  Noticing more frequent urination  Urinary incontinence/leaking (which is new for her)  Also having some increased low back pain (but not upper back)  No dysuria  No fevers, chills, vomiting. Some nausea but son thinks because of all of the holiday food they have been eating  No history of UTIs  Has not tried anything yet  Social Hx: never smoker  Review of Systems   See HPI for ROS.   Past medical history, surgical, family, and social history reviewed and updated in the EMR as appropriate.  Objective:  BP 180/88 mmHg  Pulse 86  Wt 137 lb (62.143 kg) Vitals and nursing note reviewed  General: NAD, in wheelchair Back: no CVA tenderness. Tender to midline lower back near coccyx Abdomen: soft, nontender, non distended. Normal bowel sounds   Assessment & Plan:  1. Urinary urgency UA + leuks. Will treat with acute new onset of symptoms. Keflex 500 TID x 5 days. Send urine for culture. Will follow up with results. - Urinalysis Dipstick - POCT UA - Microscopic Only

## 2015-05-05 NOTE — Telephone Encounter (Signed)
Patient's daughter, [name], left message for Dr. Lamar Benes concerning today's appt States that patient will need assistance in bathroom obtaining urine sample. Asked if collection hat could be placed in toilet. States patient has been very tired and has no energy; requesting blood work to f/u on anemia. Also states patient has been having difficulty with memory and wonders if there is something she can take for that Patient will arrive with FMLA paperwork to be completed. [name] may be reached after 3 PM at [phone] for any questions or concerns.

## 2015-05-05 NOTE — Patient Instructions (Signed)
Some blood and evidence for infection in the urine.   Treat with keflex 500mg  three times a day for 5 days  We will call you if we need to change the antibiotic, or stop it if the culture is negative.

## 2015-05-06 LAB — URINE CULTURE

## 2015-05-07 DIAGNOSIS — M4806 Spinal stenosis, lumbar region: Secondary | ICD-10-CM | POA: Diagnosis not present

## 2015-05-07 DIAGNOSIS — F0280 Dementia in other diseases classified elsewhere without behavioral disturbance: Secondary | ICD-10-CM | POA: Diagnosis not present

## 2015-05-07 DIAGNOSIS — Z471 Aftercare following joint replacement surgery: Secondary | ICD-10-CM | POA: Diagnosis not present

## 2015-05-07 DIAGNOSIS — G309 Alzheimer's disease, unspecified: Secondary | ICD-10-CM | POA: Diagnosis not present

## 2015-05-07 DIAGNOSIS — G894 Chronic pain syndrome: Secondary | ICD-10-CM | POA: Diagnosis not present

## 2015-05-07 DIAGNOSIS — M159 Polyosteoarthritis, unspecified: Secondary | ICD-10-CM | POA: Diagnosis not present

## 2015-05-12 ENCOUNTER — Telehealth: Payer: Self-pay | Admitting: *Deleted

## 2015-05-12 DIAGNOSIS — G894 Chronic pain syndrome: Secondary | ICD-10-CM | POA: Diagnosis not present

## 2015-05-12 DIAGNOSIS — F0280 Dementia in other diseases classified elsewhere without behavioral disturbance: Secondary | ICD-10-CM | POA: Diagnosis not present

## 2015-05-12 DIAGNOSIS — M159 Polyosteoarthritis, unspecified: Secondary | ICD-10-CM | POA: Diagnosis not present

## 2015-05-12 DIAGNOSIS — Z471 Aftercare following joint replacement surgery: Secondary | ICD-10-CM | POA: Diagnosis not present

## 2015-05-12 DIAGNOSIS — G309 Alzheimer's disease, unspecified: Secondary | ICD-10-CM | POA: Diagnosis not present

## 2015-05-12 DIAGNOSIS — M4806 Spinal stenosis, lumbar region: Secondary | ICD-10-CM | POA: Diagnosis not present

## 2015-05-12 NOTE — Telephone Encounter (Signed)
Patient's daughter [name] will informed her Sheldon Silvan that FMLA paperwork is complete and ready for pick up.  Forms copied for scanning in patient's record.  Derl Barrow, RN

## 2015-05-14 DIAGNOSIS — M4806 Spinal stenosis, lumbar region: Secondary | ICD-10-CM | POA: Diagnosis not present

## 2015-05-14 DIAGNOSIS — G894 Chronic pain syndrome: Secondary | ICD-10-CM | POA: Diagnosis not present

## 2015-05-14 DIAGNOSIS — F0280 Dementia in other diseases classified elsewhere without behavioral disturbance: Secondary | ICD-10-CM | POA: Diagnosis not present

## 2015-05-14 DIAGNOSIS — G309 Alzheimer's disease, unspecified: Secondary | ICD-10-CM | POA: Diagnosis not present

## 2015-05-14 DIAGNOSIS — M159 Polyosteoarthritis, unspecified: Secondary | ICD-10-CM | POA: Diagnosis not present

## 2015-05-14 DIAGNOSIS — Z471 Aftercare following joint replacement surgery: Secondary | ICD-10-CM | POA: Diagnosis not present

## 2015-05-19 DIAGNOSIS — M159 Polyosteoarthritis, unspecified: Secondary | ICD-10-CM | POA: Diagnosis not present

## 2015-05-19 DIAGNOSIS — G894 Chronic pain syndrome: Secondary | ICD-10-CM | POA: Diagnosis not present

## 2015-05-19 DIAGNOSIS — M4806 Spinal stenosis, lumbar region: Secondary | ICD-10-CM | POA: Diagnosis not present

## 2015-05-19 DIAGNOSIS — F0280 Dementia in other diseases classified elsewhere without behavioral disturbance: Secondary | ICD-10-CM | POA: Diagnosis not present

## 2015-05-19 DIAGNOSIS — Z471 Aftercare following joint replacement surgery: Secondary | ICD-10-CM | POA: Diagnosis not present

## 2015-05-19 DIAGNOSIS — G309 Alzheimer's disease, unspecified: Secondary | ICD-10-CM | POA: Diagnosis not present

## 2015-05-21 DIAGNOSIS — F0280 Dementia in other diseases classified elsewhere without behavioral disturbance: Secondary | ICD-10-CM | POA: Diagnosis not present

## 2015-05-21 DIAGNOSIS — G894 Chronic pain syndrome: Secondary | ICD-10-CM | POA: Diagnosis not present

## 2015-05-21 DIAGNOSIS — Z471 Aftercare following joint replacement surgery: Secondary | ICD-10-CM | POA: Diagnosis not present

## 2015-05-21 DIAGNOSIS — M159 Polyosteoarthritis, unspecified: Secondary | ICD-10-CM | POA: Diagnosis not present

## 2015-05-21 DIAGNOSIS — G309 Alzheimer's disease, unspecified: Secondary | ICD-10-CM | POA: Diagnosis not present

## 2015-05-21 DIAGNOSIS — M4806 Spinal stenosis, lumbar region: Secondary | ICD-10-CM | POA: Diagnosis not present

## 2015-05-27 ENCOUNTER — Encounter: Payer: Self-pay | Admitting: Internal Medicine

## 2015-05-27 NOTE — Progress Notes (Signed)
Daughter, Jordan [name] dropped off FMLA forms again for her brother.  His employer said that the wording isn't correct and that it will need to be changed.  She has provided a previous copy of the forms for provider to reference as well as a blank and the most recent form filled out.  Forms placed in provider's box. Jordan [name],CMA

## 2015-05-31 ENCOUNTER — Other Ambulatory Visit: Payer: Self-pay | Admitting: Family Medicine

## 2015-06-01 ENCOUNTER — Encounter: Payer: Self-pay | Admitting: *Deleted

## 2015-06-01 NOTE — Telephone Encounter (Signed)
Please advise as Dr. Caryl Bis has moved to a new office.  Thanks, [employer] fast streamer

## 2015-06-01 NOTE — Progress Notes (Signed)
Left voice mail for patient's daughter and son that FMLA forms are complete and ready for pick up.  Forms copied for scanning in patient's chart.  Derl Barrow, RN

## 2015-06-01 NOTE — Progress Notes (Signed)
FMLA form completed and returned to Ms. Latina Craver.

## 2015-06-01 NOTE — Telephone Encounter (Signed)
This encounter was created in error - please disregard.

## 2015-06-04 ENCOUNTER — Ambulatory Visit (INDEPENDENT_AMBULATORY_CARE_PROVIDER_SITE_OTHER): Payer: Medicare Other | Admitting: Family Medicine

## 2015-06-04 VITALS — BP 140/78 | HR 77 | Temp 98.6°F | Ht <= 58 in | Wt 133.0 lb

## 2015-06-04 DIAGNOSIS — R0600 Dyspnea, unspecified: Secondary | ICD-10-CM

## 2015-06-04 DIAGNOSIS — G309 Alzheimer's disease, unspecified: Secondary | ICD-10-CM

## 2015-06-04 DIAGNOSIS — Z79899 Other long term (current) drug therapy: Secondary | ICD-10-CM | POA: Diagnosis not present

## 2015-06-04 DIAGNOSIS — R350 Frequency of micturition: Secondary | ICD-10-CM | POA: Diagnosis not present

## 2015-06-04 DIAGNOSIS — F0280 Dementia in other diseases classified elsewhere without behavioral disturbance: Secondary | ICD-10-CM | POA: Diagnosis not present

## 2015-06-04 DIAGNOSIS — R0602 Shortness of breath: Secondary | ICD-10-CM

## 2015-06-04 LAB — CBC
HCT: 37.4 % (ref 36.0–46.0)
Hemoglobin: 12 g/dL (ref 12.0–15.0)
MCH: 31 pg (ref 26.0–34.0)
MCHC: 32.1 g/dL (ref 30.0–36.0)
MCV: 96.6 fL (ref 78.0–100.0)
MPV: 11 fL (ref 8.6–12.4)
PLATELETS: 170 10*3/uL (ref 150–400)
RBC: 3.87 MIL/uL (ref 3.87–5.11)
RDW: 13.1 % (ref 11.5–15.5)
WBC: 7.9 10*3/uL (ref 4.0–10.5)

## 2015-06-04 LAB — BASIC METABOLIC PANEL
BUN: 17 mg/dL (ref 7–25)
CALCIUM: 9 mg/dL (ref 8.6–10.4)
CO2: 30 mmol/L (ref 20–31)
CREATININE: 1.01 mg/dL — AB (ref 0.60–0.88)
Chloride: 98 mmol/L (ref 98–110)
Glucose, Bld: 98 mg/dL (ref 65–99)
Potassium: 4.5 mmol/L (ref 3.5–5.3)
Sodium: 135 mmol/L (ref 135–146)

## 2015-06-04 MED ORDER — FESOTERODINE FUMARATE ER 8 MG PO TB24: 8.0000 mg | ORAL_TABLET | Freq: Every day | ORAL | Status: DC

## 2015-06-04 NOTE — Patient Instructions (Addendum)
MOCA score down 2 points from last year which is the usual decline seen in Alzheimer's dementia  I believe Jordan [name] would benefit from home massage for her back pain.    Dr Ailany Koren will call you if your tests are not good. Otherwise he will send you a letter.  If you sign up for MyChart online, you will be able to see your test results once Dr Skylier Kretschmer has reviewed them.  If you do not hear from [place] with in 2 weeks please call our office  I will contact you with the results of the urine test of the urine you collect at home.   Consider investing in a couple pairs of Over-theCounter Graduated Compression Stocking with 15 to 20 millimeters Mercury compression. These type of stockings are tighter at the ankles then gradually become less tight as they go up the leg.  They are designed to help squeeze the fluid out of the legs and ankles back into the circulation where the fluid belongs.   If you shop around, either on the Online or at [employer] [place], you should be able to purchase a pair for about $20.  You do not need to have special fittings for this level of stocking compression, you cmay buy them straight off the store shelf or Online.   The graduated compression stockings come in a variety of style and colors now, so fashion should not get in the way of wearing them.  Knee high compression stockings would be the right length for you.  This degree of compression is not to difficult to pull on and off.  They are suprisingly comfortable.    I have had the most experience with the Graduated Compression Stockings made by the [place] called [place], but other manufacturers of compression stockings will likely also provide a stocking that compresses and is comfortable.

## 2015-06-05 ENCOUNTER — Encounter: Payer: Self-pay | Admitting: Family Medicine

## 2015-06-05 DIAGNOSIS — R350 Frequency of micturition: Secondary | ICD-10-CM | POA: Insufficient documentation

## 2015-06-05 HISTORY — DX: Frequency of micturition: R35.0

## 2015-06-05 LAB — BRAIN NATRIURETIC PEPTIDE: BRAIN NATRIURETIC PEPTIDE: 175.2 pg/mL — AB (ref 0.0–100.0)

## 2015-06-05 NOTE — Assessment & Plan Note (Signed)
New complaint No other symptoms of UTI [name] wants to bring in urine for testing from home.

## 2015-06-05 NOTE — Progress Notes (Signed)
Patient ID: Jordan [name], female   DOB: Sep 24, 1919, 82 y.o.   MRN: [id number] [facility]:   Spanish interpretation provided by Beryle Flock, Pharmacy student and patient's dgt.  [name] declined interpreter services.  Patient is accompanied by: daughter Primary caregiver: son and daughter Patient's lives with their family. Patient information was obtained from patient, relative(s) and past medical records. History/Exam limitations: dementia. Primary Care Provider: MCDIARMID,TODD D, MD Reason for referral:  Chief Complaint  Patient presents with  . alzheimers  . Urinary Frequency   History Chief Complaint  Patient presents with  . alzheimers - Formal Diagnosis in January of 2016 in [facility] -Cognitive impairment concern What problems with thinking are there? memory loss  When were the changes first noticed? several year ago  Did this change occur abruptly or gradually? gradual  How have the changes progressed since then? gradually worsening  Does their level of alertness change throughout the day? no  Is their speech disorganized, rambling? no  Has there been any tremors or abnormal movements? no  Have they had in hallucinations or delusions: no  Have they appeared more anxious or sad lately? no  Do they still have interests or activities they enjor doing? yes  How has their appetite been lately? show no change  How has their sleep been lately? No change  Problem behaviors: none      . Urinary Frequency  [name] concerned of frequency She has had symptoms for 2 days. Patient also complains of chronic back pain. Patient denies fever and dysuria. . Patient does have a history of recurrent UTI.  Patient does not have a history of pyelonephritis.     Dyspnea Cardiovascular system  Acute myocardial ischemia no hisotry of CAD(Chest pain, rest/exertion, palpitation, Hx CAD, DM, HTN, HLP, FH)  Heart failure No history of HF  (+)  Pacemenake  Respiratory system  Bronchospasm - no wheezing  Pulmonary embolism - no new asymmetric leg edema.  No surgery, No long trips. No cough.  Pulmonary infection - bronchitis, pneumonia : no fever/chill/ no cough History of SOB complaint   Echocardiogram 2016 EF 60%, no valve disorder, G1DD Outpatient Encounter Prescriptions as of 06/04/2015  Medication Sig  . acetaminophen (TYLENOL) 325 MG tablet Take 650 mg by mouth every 6 (six) hours as needed for mild pain.   Marland Kitchen allopurinol (ZYLOPRIM) 100 MG tablet Take 1 tablet (100 mg total) by mouth daily.  . beta carotene w/minerals (OCUVITE) tablet Take 1 tablet by mouth daily.  . Calcium Carbonate-Vitamin D (CALTRATE 600+D) 600-400 MG-UNIT per tablet Take 1 tablet by mouth 2 (two) times daily.    . cephALEXin (KEFLEX) 500 MG capsule Take 1 capsule (500 mg total) by mouth 3 (three) times daily.  . ciprofloxacin (CIPRO) 500 MG tablet Take 1 tablet (500 mg total) by mouth 2 (two) times daily.  . colchicine 0.6 MG tablet Take 0.6 mg by mouth 2 (two) times daily as needed (GOUT).  . cycloSPORINE (RESTASIS) 0.05 % ophthalmic emulsion Place 1 drop into both eyes at bedtime.  . DULoxetine (CYMBALTA) 60 MG capsule take 1 capsule by mouth once daily  . fesoterodine (TOVIAZ) 8 MG TB24 tablet Take 1 tablet (8 mg total) by mouth daily.  . furosemide (LASIX) 20 MG tablet Take 1 tablet (20 mg total) by mouth daily. (Patient taking differently: Take 20 mg by mouth daily as needed for fluid. )  . gabapentin (NEURONTIN) 100 MG capsule take 2 capsules by mouth three  times a day (Patient taking differently: take 2 capsules by mouth twice daily)  . ibuprofen (ADVIL,MOTRIN) 100 MG chewable tablet Chew 100-200 mg by mouth every 8 (eight) hours as needed (for pain.).   Marland Kitchen levothyroxine (SYNTHROID, LEVOTHROID) 100 MCG tablet take 1 tablet by mouth once daily  . lidocaine (XYLOCAINE) 5 % ointment apply to affected area once daily if needed DO NOT EXCEED 5 GRAMS OF  OINTMENT  . losartan (COZAAR) 50 MG tablet Take 50 mg by mouth daily.  Marland Kitchen NEXIUM 40 MG capsule take 1 capsule by mouth twice a day before meals  . oxyCODONE-acetaminophen (PERCOCET/ROXICET) 5-325 MG tablet take 1 tablet by mouth every 6 hours if needed for severe pain  . oxyCODONE-acetaminophen (ROXICET) 5-325 MG tablet Take 1-2 tablets by mouth every 4 (four) hours as needed for severe pain.  . polyethylene glycol (MIRALAX) packet Take 17 g by mouth daily. (Patient taking differently: Take 17 g by mouth daily as needed for mild constipation. )  . RA ASPIRIN EC 81 MG EC tablet take 1 tablet by mouth once daily  . RA COL-RITE 100 MG capsule take 1 capsule by mouth once daily if needed  . sucralfate (CARAFATE) 1 G tablet take 1 tablet by mouth four times a day if needed  . TRAVATAN Z 0.004 % SOLN ophthalmic solution Place 1 drop into both eyes at bedtime.   . traZODone (DESYREL) 50 MG tablet Take 1 tablet (50 mg total) by mouth at bedtime.  Marland Kitchen VIVLODEX 10 MG CAPS Take 10 mg by mouth daily as needed. For pain.  . [DISCONTINUED] fesoterodine (TOVIAZ) 8 MG TB24 tablet Take 8 mg by mouth daily.   No facility-administered encounter medications on file as of 06/04/2015.   History Patient Active Problem List   Diagnosis Date Noted  . S/P shoulder replacement 03/20/2015  . Hyperlipidemia 02/27/2015  . Diarrhea 08/21/2014  . Low oxygen saturation 07/29/2014  . GERD (gastroesophageal reflux disease) 07/25/2014  . Shortness of breath 07/21/2014  . Neuropathic pain 06/27/2014  . Impaired mobility and ADLs 05/30/2014  . Chronic pain syndrome 05/09/2014  . Hearing loss of aging 05/09/2014  . Macular degeneration of both eyes 04/18/2014  . Glaucoma 04/18/2014  . Macular degeneration, age related, nonexudative 04/18/2014  . Combined visual hearing impairment 11/12/2013  . Gout 09/21/2013  . Persistent headaches 09/06/2013  . 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 CKD (chronic kidney disease) stage 3, GFR 30-59 ml/min 09/03/2013  . Chronic diastolic heart failure ([place]) 09/01/2013  . Weakness 08/29/2013  . Mobitz type 2 second degree heart block 08/29/2013  . Constipation 08/23/2013  . Headache 08/18/2013  . Alzheimer's dementia 07/30/2013  . Spinal stenosis of lumbar region 10/03/2012  . Rotator cuff tear arthropathy 09/11/2012  . Gait instability 08/28/2012  . Insomnia 06/14/2010  . MASS, LUNG 04/05/2010  . PERIPHERAL NEUROPATHY 03/29/2010  . INTERSTITIAL CYSTITIS 09/21/2009  . History of other specified conditions presenting hazards to health 12/15/2008  . OSTEOPOROSIS 03/20/2008  . HERNIA, INCISIONAL VENTRAL W/O OBST/GNGR 10/25/2006  . Hypothyroidism 06/29/2006  . Depressive disorder 06/29/2006  . CARPAL TUNNEL SYNDROME 06/29/2006  . HTN (hypertension) 06/29/2006  . CYSTOCELE/RECTOCELE/PROLAPSE,UNSPEC. 06/29/2006  . Urinary incontinence, mixed 06/29/2006  . OSTEOARTHRITIS, MULTI SITES 06/29/2006   Past Medical History  Diagnosis Date  . Cervicalgia   . Peripheral neuropathy ([place])   . Hypertension   . Osteoporosis   . Insomnia   . Arthritis   . Headache   .  Hypothyroidism   . Right rotator cuff tear   . OA (osteoarthritis)     RIGHT SHOULDER AC JOINT  . Moderate dementia   . Gait instability   . Complete heart block ([place])   . History of colon polyps   . Diverticulosis of colon   . SUI (stress urinary incontinence, female)   . Lumbar stenosis   . Cryptogenic stroke ([place]) S/P  LOOP RECORDER IMPLANT    DX  01/2013  --  SYNCOPAL EPISODE --  EPISODIC  ATRIAL TACHY/ FIBULATION  AND PAUSES  . Frequency of urination   . Chronic constipation   . Glaucoma of both eyes   . Hearing loss of both ears     no hearing aids  . Dry eyes   . Cervicalgia 05/18/2009    Qualifier: Diagnosis of  By: Walker Kehr MD, Patrick Jupiter    . DIVERTICULOSIS OF COLON 06/29/2006    Qualifier: Diagnosis of  By: Benna Dunks    . Combined visual hearing  impairment 11/12/2013  . Urinary incontinence, mixed 06/29/2006    Qualifier: Diagnosis of  By: Benna Dunks    . INTERSTITIAL CYSTITIS 09/21/2009    Qualifier: Diagnosis of  By: Sarita Haver  MD, Coralyn Mark    . Syncope and collapse 02/09/2013  . Right knee pain 01/15/2014  . Polymyalgia rheumatica ([place]) 11/08/2013  . Loss of weight 04/05/2010    Qualifier: Diagnosis of  By: Walker Kehr MD, Patrick Jupiter    . Macular degeneration, age related, nonexudative 04/18/2014  . Hearing loss of aging 05/09/2014  . Presence of permanent cardiac pacemaker   . Shortness of breath dyspnea   . Pacemaker   . Constipation    Past Surgical History  Procedure Laterality Date  . Loop recorder implant  02-08-13; 08-30-13    MDT LinQ implanted by Dr Lovena Le for syncope, explanted 08-30-13 after CHB identified  . Total abdominal hysterectomy w/ bilateral salpingoophorectomy  03-15-2005  . Carpal tunnel release Right   . Eye surgery      cataract, bilat.  . Pacemaker insertion  08-30-2013    MDT ADDRL1 pacemaker implanted by Dr Rayann Heman for CHB  . Loop recorder implant N/A 02/11/2013    Procedure: LOOP RECORDER IMPLANT;  Surgeon: Evans Lance, MD;  [facility];  Service: Cardiovascular;  Laterality: N/A;  . Permanent pacemaker insertion N/A 08/30/2013    Procedure: PERMANENT PACEMAKER INSERTION;  Surgeon: Coralyn Mark, MD;  Location: [facility] CATH LAB;  Service: Cardiovascular;  Laterality: N/A;  . Insert / replace / remove pacemaker    . Reverse shoulder arthroplasty Right 03/20/2015    Procedure: RIGHT REVERSE SHOULDER ARTHROPLASTY;  Surgeon: Netta Cedars, MD;  Location: [facility];  Service: Orthopedics;  Laterality: Right;   Family History  Problem Relation Age of Onset  . Kidney disease Mother   . Heart attack Neg Hx   . Stroke Neg Hx    Social History   Social History  . Marital Status: Widowed    Spouse Name: N/A  . Number of Children: N/A  . Years of Education: N/A   Social History Main Topics  . Smoking status: Never  Smoker   . Smokeless tobacco: None  . Alcohol Use: No  . Drug Use: No  . Sexual Activity: No   Other Topics Concern  . None   Social History Narrative   ** Merged History Encounter **       lives with son, [place]; No tob, etoh.  Has three total children.  1 g'child.;  Dtr [name] (w) [phone], (h) [phone]; dtr [place]. Daughters usually interpret for her at visits and are very involved and ask lots of questions.     Family History of Dementia: No -   Basic Activities of Daily Living  ADLs Independent Needs Assistance Dependent  Bathing x    Dressing  x   Ambulation   x  Toileting  x   Eating x     Instrumental Activities of Daily Living IADL Independent Needs Assistance Dependent  Cooking   x  Housework   x  Manage Medications   x  Manage the telephone   x  Shopping for food, clothes, Meds, etc   x  Use transportation   x  Manage Finances   x    Caregivers in home: son and daughter  Caregiver Burdens: do not feel excessive   Falls in the past six months:   no  Health Maintenance reviewed: Immunization History  Administered Date(s) Administered  . Influenza Split 01/24/2012  . Influenza Whole 03/05/2007, 02/22/2010  . Influenza,inj,Quad PF,36+ Mos 02/10/2013, 01/14/2014, 01/20/2015  . Pneumococcal Conjugate-13 02/27/2015  . Pneumococcal Polysaccharide-23 01/30/1997  . Td 03/03/1999, 05/18/2009     Diet: Regular Nutritional supplements: none  Geriatric Syndromes: Constipation yes ,   Incontinence yes  Dizziness no   Syncope no   Skin problems no   Visual Impairment yes   Hearing impairment yes Wearing biaural hearing aids Eating impairment no  Impaired Memory or Cognition yes   Behavioral problems no   Sleep problems no   Weight loss no    ROS Denies fevers/chills; denies changes in appetite; denies changes in weight;  nies heart beating slower/thumps inside chest; denies racing heart/flutter; Denies dysuria; denies hematuria;   Denies  abdominal discomfort   Vital Signs Weight: 133 lb (60.328 kg) Body mass index is 33.3 kg/(m^2). Estimated Creatinine Clearance: 23 mL/min (by C-G formula based on Cr of 1.01). Body surface area is 1.50 meters squared. Filed Vitals:   06/04/15 1549 06/04/15 1710  BP: 162/70 140/78  Pulse: 77   Temp: 98.6 F (37 C)   TempSrc: Oral   Height: 4\' 5"  (1.346 m)   Weight: 133 lb (60.328 kg)   SpO2: 99%    Wt Readings from Last 3 Encounters:  06/04/15 133 lb (60.328 kg)  05/05/15 137 lb (62.143 kg)  03/20/15 138 lb 11.2 oz (62.914 kg)   No exam data present  Physical Examination:  VS reviewed GEN: Alert, Cooperative, Groomed, NAD COR: RRR, No M.  Bilateral carotid artery bruits LUNGS: BCTA, No Acc mm use, speaking in full sentences ABDOMEN: (+)BS, soft, NT, ND  EXT: trace ankle edema bilaterally. No cyanosis.   Neuro: Muscle Tone normal; Tremor not present   Mini-Mental State Examination or Montreal Cognitive Assessment:  Patient did  require additional cues or prompts to complete tasks. Patient was cooperative and attentive to testing tasks Patient did  appear motivated to perform well  No flowsheet data found.      [place] Cognitive Assessment  04/17/2014  Visuospatial/ Executive (0/5) (No Data)  Naming (0/3) (No Data)  Attention: Read list of digits (0/2) 0  Attention: Read list of letters (0/1) 0  Attention: Serial 7 subtraction starting at 100 (0/3) 1  Language: Repeat phrase (0/2) 0  Language : Fluency (0/1) 0  Abstraction (0/2) 1  Delayed Recall (0/5) 0  Orientation (0/6) 6     Labs  Lab Results  Component Value Date  [id number] 11/16/2010    Lab Results  Component Value Date   FOLATE >20.0 ng/mL 03/21/2008    Lab Results  Component Value Date   TSH 1.296 02/27/2015    No results found for: RPR    Chemistry      Component Value Date/Time   NA 135 06/04/2015 1704   K 4.5 06/04/2015 1704   CL 98 06/04/2015 1704   CO2 30 06/04/2015  1704   BUN 17 06/04/2015 1704   CREATININE 1.01* 06/04/2015 1704   CREATININE 1.33* 03/21/2015 0309      Component Value Date/Time   CALCIUM 9.0 06/04/2015 1704   ALKPHOS 101 08/30/2013 0504   AST 43* 08/30/2013 0504   ALT 93* 08/30/2013 0504   BILITOT 0.4 08/30/2013 0504       Lab Results  Component Value Date   HGBA1C  07/01/2010    5.6 (NOTE)                                                                       According to the ADA Clinical Practice Recommendations for 2011, when HbA1c is used as a screening test:   >=6.5%   Diagnostic of Diabetes Mellitus           (if abnormal result  is confirmed)  5.7-6.4%   Increased risk of developing Diabetes Mellitus  References:Diagnosis and Classification of Diabetes Mellitus,Diabetes [id number] 1):S62-S69 and Standards of Medical Care in         Diabetes - 2011,Diabetes Care,2011,34  (Suppl 1):S11-S61.      Lab Results  Component Value Date   WBC 7.9 06/04/2015   HGB 12.0 06/04/2015   HCT 37.4 06/04/2015   MCV 96.6 06/04/2015   PLT 170 06/04/2015    Results for orders placed or performed in visit on 06/04/15 (from the past 24 hour(s))  Basic Metabolic Panel   Collection Time: 06/04/15  5:04 PM  Result Value Ref Range   Sodium 135 135 - 146 mmol/L   Potassium 4.5 3.5 - 5.3 mmol/L   Chloride 98 98 - 110 mmol/L   CO2 30 20 - 31 mmol/L   Glucose, Bld 98 65 - 99 mg/dL   BUN 17 7 - 25 mg/dL   Creat 1.01 (H) 0.60 - 0.88 mg/dL   Calcium 9.0 8.6 - 10.4 mg/dL  CBC   Collection Time: 06/04/15  5:04 PM  Result Value Ref Range   WBC 7.9 4.0 - 10.5 K/uL   RBC 3.87 3.87 - 5.11 MIL/uL   Hemoglobin 12.0 12.0 - 15.0 g/dL   HCT 37.4 36.0 - 46.0 %   MCV 96.6 78.0 - 100.0 fL   MCH 31.0 26.0 - 34.0 pg   MCHC 32.1 30.0 - 36.0 g/dL   RDW 13.1 11.5 - 15.5 %   Platelets 170 150 - 400 K/uL   MPV 11.0 8.6 - 12.4 fL  Brain natriuretic peptide   Collection Time: 06/04/15  5:04 PM  Result Value Ref Range   Brain Natriuretic  Peptide 175.2 (H) 0.0 - 100.0 pg/mL       Assessment and Plan: Problem List Items Addressed This Visit    None    Visit Diagnoses    High risk medication use    -  Primary    Relevant Orders    Basic Metabolic Panel (Completed)    CBC (Completed)    Dyspnea        Relevant Orders    Brain natriuretic peptide (Completed)    Urinary frequency        Relevant Orders    POCT urinalysis dipstick       Personal Strengths Supportive Relationships, Family, Church and Spirituality  Support System Strengths Religious Affiliation   Advanced Directives: Code Status: Full code   [place] Cognitive Assessment  06/05/2015 04/17/2014  Visuospatial/ Executive (0/5) (No Data) (No Data)  Naming (0/3) (No Data) (No Data)  Attention: Read list of digits (0/2) 0 0  Attention: Read list of letters (0/1) 0 0  Attention: Serial 7 subtraction starting at 100 (0/3) 1 1  Language: Repeat phrase (0/2) 0 0  Language : Fluency (0/1) 0 0  Abstraction (0/2) 0 1  Delayed Recall (0/5) 0 0  Orientation (0/6) 4 6   Adjusted MocA for Blind = 7 out of 30 (06/05/15)

## 2015-06-05 NOTE — Assessment & Plan Note (Signed)
Chronic complaint. No evidence of acute process of CV or pulmnary CBC:    Component Value Date/Time   WBC 7.9 06/04/2015 1704   HGB 12.0 06/04/2015 1704   HGB 9.9* 05/05/2015 1645   HCT 37.4 06/04/2015 1704   PLT 170 06/04/2015 1704   MCV 96.6 06/04/2015 1704   NEUTROABS 4.9 07/29/2014 1522   LYMPHSABS 1.5 07/29/2014 1522   MONOABS 0.5 07/29/2014 1522   EOSABS 0.1 07/29/2014 1522   BASOSABS 0.0 07/29/2014 1522      BNP   ProBNP    Component Value Date/Time   PROBNP 183.0* 07/03/2014 1653   Await BNP result.  Working diagnosis is deconditioning.

## 2015-06-05 NOTE — Assessment & Plan Note (Addendum)
Slgiht decline in MoCa score of about a point that is in test variance.  FAST Stage: Six memory loss, slow information processing, difficulty with abstract concepts and difficulty with complex reasoning decreased attention Palliative Performance Score: 50 to 60%.  Behavioral and Psychological Symptoms of Dementia: none Nonpharmacologic treatments of patient: none Pharmacologic treatments of patient: none Supportive Interventions for caregiver (support group or individual counseling): not needed at this time  Physical Activity recommendation: Try to walk with walker about house for chronic back pain Long-term care planning: reside at home with family  Follow up surveillance appointment in 3 to 6 months: 3 month

## 2015-06-08 ENCOUNTER — Other Ambulatory Visit: Payer: Self-pay | Admitting: Family Medicine

## 2015-06-08 NOTE — Telephone Encounter (Signed)
Will forward to MD for refill. [name],CMA

## 2015-06-08 NOTE — Telephone Encounter (Signed)
Daughter called and her mother needs a refill on her Carafate called in. jw

## 2015-06-09 ENCOUNTER — Other Ambulatory Visit: Payer: Self-pay | Admitting: Family Medicine

## 2015-06-09 MED ORDER — SUCRALFATE 1 G PO TABS: 1.0000 g | ORAL_TABLET | Freq: Three times a day (TID) | ORAL | Status: DC

## 2015-06-16 ENCOUNTER — Other Ambulatory Visit: Payer: Self-pay | Admitting: Internal Medicine

## 2015-06-22 ENCOUNTER — Telehealth: Payer: Self-pay | Admitting: Family Medicine

## 2015-06-22 NOTE — Telephone Encounter (Signed)
Will forward to Dr. McDiarmid. Emanuel Campos,CMA

## 2015-06-22 NOTE — Telephone Encounter (Signed)
Daughter called and would like to know what the lab results from the last visit were. w

## 2015-06-25 NOTE — Telephone Encounter (Signed)
Daughter has applied for more services for her mom. Sent application to CAPS program. Dr McDiarmid should be receiving the form, Please keep your eyes open for this.  Please let Jordan [name] know when the form is completed. If Jasmin wants to call [facility], she can fax over the last form completed by Dr.

## 2015-06-25 NOTE — Telephone Encounter (Signed)
Please let Ms Mcclenahan's Jordan [name], that her mothers blood work from 06/04/15 looks good.  Her heart looks to be working as well as it was a year ago according to the blood work.  I do not think her shortness of breath is due to her heart.  It is likely from her deconditioning.

## 2015-06-25 NOTE — Telephone Encounter (Signed)
Will forward to MD. Pierre Cumpton,CMA  

## 2015-06-26 ENCOUNTER — Encounter: Payer: Self-pay | Admitting: Family Medicine

## 2015-06-26 NOTE — Progress Notes (Unsigned)
Notice from [facility] that patient on duplicate GI therapy: Esomeprazole and Sulcrafate.  Sulcrafate is indicated for short-term and maintenance therpay of Duodenal Ulcers.  Patient was started on Sulcrafate for GERD 01/20/15.  Will discuss stopping this medication on next ov with patient.

## 2015-06-26 NOTE — Telephone Encounter (Signed)
Spoke with daughter and she is aware of this.  States that she will be dropping off a copy of patient's last FL2 form if needed to go with CAP application. Willow Reczek,CMA

## 2015-06-28 ENCOUNTER — Other Ambulatory Visit: Payer: Self-pay | Admitting: Internal Medicine

## 2015-07-01 ENCOUNTER — Other Ambulatory Visit: Payer: Self-pay | Admitting: Internal Medicine

## 2015-07-01 ENCOUNTER — Other Ambulatory Visit: Payer: Self-pay | Admitting: Family Medicine

## 2015-07-01 DIAGNOSIS — I5032 Chronic diastolic (congestive) heart failure: Secondary | ICD-10-CM | POA: Diagnosis not present

## 2015-07-01 DIAGNOSIS — I10 Essential (primary) hypertension: Secondary | ICD-10-CM | POA: Diagnosis not present

## 2015-07-01 DIAGNOSIS — Z950 Presence of cardiac pacemaker: Secondary | ICD-10-CM | POA: Diagnosis not present

## 2015-07-07 ENCOUNTER — Telehealth: Payer: Self-pay | Admitting: Licensed Clinical Social Worker

## 2015-07-07 NOTE — Telephone Encounter (Signed)
CSW received request from DSS to complete an FL2.  No consent for release of information was attached.  Call to patient's daughter Stasia Cavalier (caregiver) to inform her a signed consent is needed in order to provide the information.  [name] requested that [place] fax the consent and she will return it.  Daughter understands that she needs to provide her ID when she returns the signed consent form.  CSW will place FL2 in [employer]. Will fax information once FL2 is complete and received signed consent form.   [name]. [place] Work Department [phone] 3:32 PM

## 2015-07-08 ENCOUNTER — Other Ambulatory Visit: Payer: Self-pay | Admitting: Family Medicine

## 2015-07-09 ENCOUNTER — Encounter: Payer: Self-pay | Admitting: Family Medicine

## 2015-07-09 ENCOUNTER — Other Ambulatory Visit (INDEPENDENT_AMBULATORY_CARE_PROVIDER_SITE_OTHER): Payer: Medicare Other | Admitting: *Deleted

## 2015-07-09 ENCOUNTER — Other Ambulatory Visit: Payer: Self-pay | Admitting: Family Medicine

## 2015-07-09 DIAGNOSIS — R350 Frequency of micturition: Secondary | ICD-10-CM | POA: Diagnosis present

## 2015-07-09 LAB — POCT URINALYSIS DIPSTICK
BILIRUBIN UA: NEGATIVE
GLUCOSE UA: NEGATIVE
KETONES UA: NEGATIVE
Nitrite, UA: NEGATIVE
PH UA: 6.5
Protein, UA: 100
SPEC GRAV UA: 1.015
Urobilinogen, UA: 0.2

## 2015-07-09 LAB — POCT UA - MICROSCOPIC ONLY

## 2015-07-09 MED ORDER — ONDANSETRON HCL 4 MG PO TABS: 8.0000 mg | ORAL_TABLET | Freq: Three times a day (TID) | ORAL | Status: DC | PRN

## 2015-07-09 MED ORDER — FUROSEMIDE 20 MG PO TABS: 20.0000 mg | ORAL_TABLET | Freq: Every day | ORAL | Status: DC

## 2015-07-09 MED ORDER — GABAPENTIN 100 MG PO CAPS: ORAL_CAPSULE | ORAL | Status: DC

## 2015-07-09 MED ORDER — DICLOFENAC SODIUM 1 % TD GEL: TRANSDERMAL | Status: DC

## 2015-07-09 MED ORDER — COLCHICINE 0.6 MG PO TABS: 0.6000 mg | ORAL_TABLET | Freq: Two times a day (BID) | ORAL | Status: DC | PRN

## 2015-07-09 NOTE — Telephone Encounter (Signed)
Daughter called for refills for her mother. She needs Lasix, Voltaren, Zofran, Colcrys, Colchicine, gabapentin, and Colace all refilled . jw

## 2015-07-09 NOTE — Telephone Encounter (Signed)
I completed the FL2 form for Jordan [name]. I placed the completed form on Jordan Moore's [facility] desk for appropriate dissemination.

## 2015-07-09 NOTE — Progress Notes (Unsigned)
Patient ID: Jordan [name], female   DOB: 1919/06/02, 82 y.o.   MRN: [id number] Notice from [facility] of patient's continued PPI therapy. Will need to address change to H2B of reduction in dose or change to intermittent PPI therapy on next ov.

## 2015-07-09 NOTE — Progress Notes (Unsigned)
Patient ID: Jordan [name], female   DOB: June 08, 1919, 82 y.o.   MRN: [id number] Orders to [facility] for skilled nursing at home for low BP, general weakness, dizziness, fatigue.  Copies of last ov and med list fax'd with orders. [id number]

## 2015-07-10 ENCOUNTER — Telehealth: Payer: Self-pay | Admitting: Licensed Clinical Social Worker

## 2015-07-10 NOTE — Telephone Encounter (Signed)
Patient's FL2 completed and signed by Dr. Wendy Poet.  Call to patient's daughter Jordan [name] [phone] to inform her the form is ready for pick up.  Per daughter, she will send her husband to pick up the form.  CSW left a copy of FL2 at reception area to be pickup today.    [name]. [place] Work  [phone] 9:21 AM

## 2015-07-15 ENCOUNTER — Telehealth: Payer: Self-pay | Admitting: Family Medicine

## 2015-07-15 ENCOUNTER — Encounter: Payer: Self-pay | Admitting: Licensed Clinical Social Worker

## 2015-07-15 NOTE — Progress Notes (Signed)
Patient ID: Jordan [name], female   DOB: 1919/12/26, 82 y.o.   MRN: [id number]  Call to DSS worker Celene Kras [phone], informed CSW that she had received the completed FL2.  [name]. [place] Work  [phone] 8:40 AM

## 2015-07-15 NOTE — Telephone Encounter (Signed)
Will forward to MD to give verbal ok for this change. [name],CMA

## 2015-07-15 NOTE — Telephone Encounter (Signed)
Jordan [name] is with [facility]. Pt did not want home health services this week. Pt wants to start next Monday March 20.  Jordan [name] needs verbal orders to be able to see her on Monday

## 2015-07-16 NOTE — Telephone Encounter (Signed)
Please call [facility] with authorization for them to see Ms Dopson next Monday.

## 2015-07-17 NOTE — Telephone Encounter (Signed)
Verbal authorization given to carol. Lynesha Bango,CMA

## 2015-07-20 NOTE — Telephone Encounter (Signed)
Please call in verbal authorization for [facility] to go out tomorrow, 07/21/15, to Ms Mooers's home.

## 2015-07-20 NOTE — Telephone Encounter (Signed)
[facility] calls again, [name] was not able to get off work today so they need verbal orders to go out tomorrow.   Please give [facility]bie Cookey a call back with verbal orders. Danicia Terhaar, [name], CMA

## 2015-07-20 NOTE — Telephone Encounter (Signed)
Clld [facility] - per PCP, gave verbal authorization,

## 2015-07-21 ENCOUNTER — Telehealth: Payer: Self-pay | Admitting: Family Medicine

## 2015-07-21 DIAGNOSIS — N183 Chronic kidney disease, stage 3 (moderate): Secondary | ICD-10-CM | POA: Diagnosis not present

## 2015-07-21 DIAGNOSIS — F0280 Dementia in other diseases classified elsewhere without behavioral disturbance: Secondary | ICD-10-CM | POA: Diagnosis not present

## 2015-07-21 DIAGNOSIS — I5032 Chronic diastolic (congestive) heart failure: Secondary | ICD-10-CM | POA: Diagnosis not present

## 2015-07-21 DIAGNOSIS — G309 Alzheimer's disease, unspecified: Secondary | ICD-10-CM | POA: Diagnosis not present

## 2015-07-21 DIAGNOSIS — M4806 Spinal stenosis, lumbar region: Secondary | ICD-10-CM | POA: Diagnosis not present

## 2015-07-21 DIAGNOSIS — I130 Hypertensive heart and chronic kidney disease with heart failure and stage 1 through stage 4 chronic kidney disease, or unspecified chronic kidney disease: Secondary | ICD-10-CM | POA: Diagnosis not present

## 2015-07-21 NOTE — Telephone Encounter (Signed)
Will forward to MD. Jordan [name],CMA  

## 2015-07-21 NOTE — Telephone Encounter (Signed)
Called to say that they need verbal order to continue skilled nursing services 1 x weekly for 8 weeks.  Will do pain mgt, med education and education of dementia for family.  Also need order for PT evaluation.  Please call Olean Ree at [phone] for orders.

## 2015-07-22 NOTE — Telephone Encounter (Signed)
Please give authorization for skilled nursing service as described in 07/21/15 telephone note.

## 2015-07-22 NOTE — Telephone Encounter (Signed)
LM for carol with verbal ok. [name],CMA

## 2015-07-28 DIAGNOSIS — Z96611 Presence of right artificial shoulder joint: Secondary | ICD-10-CM | POA: Diagnosis not present

## 2015-07-28 DIAGNOSIS — Z471 Aftercare following joint replacement surgery: Secondary | ICD-10-CM | POA: Diagnosis not present

## 2015-07-30 DIAGNOSIS — M4806 Spinal stenosis, lumbar region: Secondary | ICD-10-CM | POA: Diagnosis not present

## 2015-07-30 DIAGNOSIS — I130 Hypertensive heart and chronic kidney disease with heart failure and stage 1 through stage 4 chronic kidney disease, or unspecified chronic kidney disease: Secondary | ICD-10-CM | POA: Diagnosis not present

## 2015-07-30 DIAGNOSIS — N183 Chronic kidney disease, stage 3 (moderate): Secondary | ICD-10-CM | POA: Diagnosis not present

## 2015-07-30 DIAGNOSIS — F0280 Dementia in other diseases classified elsewhere without behavioral disturbance: Secondary | ICD-10-CM | POA: Diagnosis not present

## 2015-07-30 DIAGNOSIS — G309 Alzheimer's disease, unspecified: Secondary | ICD-10-CM | POA: Diagnosis not present

## 2015-07-30 DIAGNOSIS — I5032 Chronic diastolic (congestive) heart failure: Secondary | ICD-10-CM | POA: Diagnosis not present

## 2015-07-31 ENCOUNTER — Other Ambulatory Visit: Payer: Self-pay | Admitting: Internal Medicine

## 2015-07-31 ENCOUNTER — Other Ambulatory Visit: Payer: Self-pay | Admitting: Family Medicine

## 2015-07-31 DIAGNOSIS — I5032 Chronic diastolic (congestive) heart failure: Secondary | ICD-10-CM | POA: Diagnosis not present

## 2015-07-31 DIAGNOSIS — N183 Chronic kidney disease, stage 3 (moderate): Secondary | ICD-10-CM | POA: Diagnosis not present

## 2015-07-31 DIAGNOSIS — F0280 Dementia in other diseases classified elsewhere without behavioral disturbance: Secondary | ICD-10-CM | POA: Diagnosis not present

## 2015-07-31 DIAGNOSIS — G309 Alzheimer's disease, unspecified: Secondary | ICD-10-CM | POA: Diagnosis not present

## 2015-07-31 DIAGNOSIS — I130 Hypertensive heart and chronic kidney disease with heart failure and stage 1 through stage 4 chronic kidney disease, or unspecified chronic kidney disease: Secondary | ICD-10-CM | POA: Diagnosis not present

## 2015-07-31 DIAGNOSIS — M4806 Spinal stenosis, lumbar region: Secondary | ICD-10-CM | POA: Diagnosis not present

## 2015-08-03 ENCOUNTER — Telehealth: Payer: Self-pay | Admitting: Family Medicine

## 2015-08-03 NOTE — Telephone Encounter (Signed)
LM for Gracie to call back regarding our mutual patient. [name],CMA

## 2015-08-03 NOTE — Telephone Encounter (Signed)
Gracie who is the PT for [facility] called and is requesting verbal orders for the patient, 1 time a week for 1 week, and 2 times a week for 6 weeks. Please call Gracie at [phone]. jw

## 2015-08-03 NOTE — Telephone Encounter (Signed)
Please call [name] PT, with a verbal order authorizing for [name] to receive home PT 1 time a week for one week, 2 times a week for 6 weeks.

## 2015-08-03 NOTE — Telephone Encounter (Signed)
Will forward to MD. [name],CMA  

## 2015-08-04 DIAGNOSIS — G309 Alzheimer's disease, unspecified: Secondary | ICD-10-CM | POA: Diagnosis not present

## 2015-08-04 DIAGNOSIS — I130 Hypertensive heart and chronic kidney disease with heart failure and stage 1 through stage 4 chronic kidney disease, or unspecified chronic kidney disease: Secondary | ICD-10-CM | POA: Diagnosis not present

## 2015-08-04 DIAGNOSIS — M4806 Spinal stenosis, lumbar region: Secondary | ICD-10-CM | POA: Diagnosis not present

## 2015-08-04 DIAGNOSIS — I5032 Chronic diastolic (congestive) heart failure: Secondary | ICD-10-CM | POA: Diagnosis not present

## 2015-08-04 DIAGNOSIS — F0280 Dementia in other diseases classified elsewhere without behavioral disturbance: Secondary | ICD-10-CM | POA: Diagnosis not present

## 2015-08-04 DIAGNOSIS — N183 Chronic kidney disease, stage 3 (moderate): Secondary | ICD-10-CM | POA: Diagnosis not present

## 2015-08-04 NOTE — Telephone Encounter (Signed)
Verbal orders given to Gracie with [facility].  [name],CMA

## 2015-08-05 DIAGNOSIS — I130 Hypertensive heart and chronic kidney disease with heart failure and stage 1 through stage 4 chronic kidney disease, or unspecified chronic kidney disease: Secondary | ICD-10-CM | POA: Diagnosis not present

## 2015-08-05 DIAGNOSIS — I5032 Chronic diastolic (congestive) heart failure: Secondary | ICD-10-CM | POA: Diagnosis not present

## 2015-08-05 DIAGNOSIS — F0280 Dementia in other diseases classified elsewhere without behavioral disturbance: Secondary | ICD-10-CM | POA: Diagnosis not present

## 2015-08-05 DIAGNOSIS — M4806 Spinal stenosis, lumbar region: Secondary | ICD-10-CM | POA: Diagnosis not present

## 2015-08-05 DIAGNOSIS — G309 Alzheimer's disease, unspecified: Secondary | ICD-10-CM | POA: Diagnosis not present

## 2015-08-05 DIAGNOSIS — N183 Chronic kidney disease, stage 3 (moderate): Secondary | ICD-10-CM | POA: Diagnosis not present

## 2015-08-06 DIAGNOSIS — I130 Hypertensive heart and chronic kidney disease with heart failure and stage 1 through stage 4 chronic kidney disease, or unspecified chronic kidney disease: Secondary | ICD-10-CM | POA: Diagnosis not present

## 2015-08-06 DIAGNOSIS — G309 Alzheimer's disease, unspecified: Secondary | ICD-10-CM | POA: Diagnosis not present

## 2015-08-06 DIAGNOSIS — F0280 Dementia in other diseases classified elsewhere without behavioral disturbance: Secondary | ICD-10-CM | POA: Diagnosis not present

## 2015-08-06 DIAGNOSIS — M4806 Spinal stenosis, lumbar region: Secondary | ICD-10-CM | POA: Diagnosis not present

## 2015-08-06 DIAGNOSIS — I5032 Chronic diastolic (congestive) heart failure: Secondary | ICD-10-CM | POA: Diagnosis not present

## 2015-08-06 DIAGNOSIS — N183 Chronic kidney disease, stage 3 (moderate): Secondary | ICD-10-CM | POA: Diagnosis not present

## 2015-08-10 DIAGNOSIS — G309 Alzheimer's disease, unspecified: Secondary | ICD-10-CM | POA: Diagnosis not present

## 2015-08-10 DIAGNOSIS — M4806 Spinal stenosis, lumbar region: Secondary | ICD-10-CM | POA: Diagnosis not present

## 2015-08-10 DIAGNOSIS — N183 Chronic kidney disease, stage 3 (moderate): Secondary | ICD-10-CM | POA: Diagnosis not present

## 2015-08-10 DIAGNOSIS — I5032 Chronic diastolic (congestive) heart failure: Secondary | ICD-10-CM | POA: Diagnosis not present

## 2015-08-10 DIAGNOSIS — I130 Hypertensive heart and chronic kidney disease with heart failure and stage 1 through stage 4 chronic kidney disease, or unspecified chronic kidney disease: Secondary | ICD-10-CM | POA: Diagnosis not present

## 2015-08-10 DIAGNOSIS — F0280 Dementia in other diseases classified elsewhere without behavioral disturbance: Secondary | ICD-10-CM | POA: Diagnosis not present

## 2015-08-11 DIAGNOSIS — F0280 Dementia in other diseases classified elsewhere without behavioral disturbance: Secondary | ICD-10-CM | POA: Diagnosis not present

## 2015-08-11 DIAGNOSIS — I130 Hypertensive heart and chronic kidney disease with heart failure and stage 1 through stage 4 chronic kidney disease, or unspecified chronic kidney disease: Secondary | ICD-10-CM | POA: Diagnosis not present

## 2015-08-11 DIAGNOSIS — I5032 Chronic diastolic (congestive) heart failure: Secondary | ICD-10-CM | POA: Diagnosis not present

## 2015-08-11 DIAGNOSIS — G309 Alzheimer's disease, unspecified: Secondary | ICD-10-CM | POA: Diagnosis not present

## 2015-08-11 DIAGNOSIS — N183 Chronic kidney disease, stage 3 (moderate): Secondary | ICD-10-CM | POA: Diagnosis not present

## 2015-08-11 DIAGNOSIS — M4806 Spinal stenosis, lumbar region: Secondary | ICD-10-CM | POA: Diagnosis not present

## 2015-08-12 DIAGNOSIS — I5032 Chronic diastolic (congestive) heart failure: Secondary | ICD-10-CM | POA: Diagnosis not present

## 2015-08-12 DIAGNOSIS — M4806 Spinal stenosis, lumbar region: Secondary | ICD-10-CM | POA: Diagnosis not present

## 2015-08-12 DIAGNOSIS — G309 Alzheimer's disease, unspecified: Secondary | ICD-10-CM | POA: Diagnosis not present

## 2015-08-12 DIAGNOSIS — N183 Chronic kidney disease, stage 3 (moderate): Secondary | ICD-10-CM | POA: Diagnosis not present

## 2015-08-12 DIAGNOSIS — F0280 Dementia in other diseases classified elsewhere without behavioral disturbance: Secondary | ICD-10-CM | POA: Diagnosis not present

## 2015-08-12 DIAGNOSIS — I130 Hypertensive heart and chronic kidney disease with heart failure and stage 1 through stage 4 chronic kidney disease, or unspecified chronic kidney disease: Secondary | ICD-10-CM | POA: Diagnosis not present

## 2015-08-13 DIAGNOSIS — H401133 Primary open-angle glaucoma, bilateral, severe stage: Secondary | ICD-10-CM | POA: Diagnosis not present

## 2015-08-18 DIAGNOSIS — I130 Hypertensive heart and chronic kidney disease with heart failure and stage 1 through stage 4 chronic kidney disease, or unspecified chronic kidney disease: Secondary | ICD-10-CM | POA: Diagnosis not present

## 2015-08-18 DIAGNOSIS — N183 Chronic kidney disease, stage 3 (moderate): Secondary | ICD-10-CM | POA: Diagnosis not present

## 2015-08-18 DIAGNOSIS — M4806 Spinal stenosis, lumbar region: Secondary | ICD-10-CM | POA: Diagnosis not present

## 2015-08-18 DIAGNOSIS — I5032 Chronic diastolic (congestive) heart failure: Secondary | ICD-10-CM | POA: Diagnosis not present

## 2015-08-18 DIAGNOSIS — G309 Alzheimer's disease, unspecified: Secondary | ICD-10-CM | POA: Diagnosis not present

## 2015-08-18 DIAGNOSIS — F0280 Dementia in other diseases classified elsewhere without behavioral disturbance: Secondary | ICD-10-CM | POA: Diagnosis not present

## 2015-08-19 DIAGNOSIS — M4806 Spinal stenosis, lumbar region: Secondary | ICD-10-CM | POA: Diagnosis not present

## 2015-08-19 DIAGNOSIS — I5032 Chronic diastolic (congestive) heart failure: Secondary | ICD-10-CM | POA: Diagnosis not present

## 2015-08-19 DIAGNOSIS — I130 Hypertensive heart and chronic kidney disease with heart failure and stage 1 through stage 4 chronic kidney disease, or unspecified chronic kidney disease: Secondary | ICD-10-CM | POA: Diagnosis not present

## 2015-08-19 DIAGNOSIS — N183 Chronic kidney disease, stage 3 (moderate): Secondary | ICD-10-CM | POA: Diagnosis not present

## 2015-08-19 DIAGNOSIS — G309 Alzheimer's disease, unspecified: Secondary | ICD-10-CM | POA: Diagnosis not present

## 2015-08-19 DIAGNOSIS — F0280 Dementia in other diseases classified elsewhere without behavioral disturbance: Secondary | ICD-10-CM | POA: Diagnosis not present

## 2015-08-20 DIAGNOSIS — I130 Hypertensive heart and chronic kidney disease with heart failure and stage 1 through stage 4 chronic kidney disease, or unspecified chronic kidney disease: Secondary | ICD-10-CM | POA: Diagnosis not present

## 2015-08-20 DIAGNOSIS — G309 Alzheimer's disease, unspecified: Secondary | ICD-10-CM | POA: Diagnosis not present

## 2015-08-20 DIAGNOSIS — F0280 Dementia in other diseases classified elsewhere without behavioral disturbance: Secondary | ICD-10-CM | POA: Diagnosis not present

## 2015-08-20 DIAGNOSIS — I5032 Chronic diastolic (congestive) heart failure: Secondary | ICD-10-CM | POA: Diagnosis not present

## 2015-08-20 DIAGNOSIS — N183 Chronic kidney disease, stage 3 (moderate): Secondary | ICD-10-CM | POA: Diagnosis not present

## 2015-08-20 DIAGNOSIS — M4806 Spinal stenosis, lumbar region: Secondary | ICD-10-CM | POA: Diagnosis not present

## 2015-08-25 ENCOUNTER — Other Ambulatory Visit: Payer: Self-pay | Admitting: Family Medicine

## 2015-08-25 DIAGNOSIS — N183 Chronic kidney disease, stage 3 (moderate): Secondary | ICD-10-CM | POA: Diagnosis not present

## 2015-08-25 DIAGNOSIS — I5032 Chronic diastolic (congestive) heart failure: Secondary | ICD-10-CM | POA: Diagnosis not present

## 2015-08-25 DIAGNOSIS — F0280 Dementia in other diseases classified elsewhere without behavioral disturbance: Secondary | ICD-10-CM | POA: Diagnosis not present

## 2015-08-25 DIAGNOSIS — I130 Hypertensive heart and chronic kidney disease with heart failure and stage 1 through stage 4 chronic kidney disease, or unspecified chronic kidney disease: Secondary | ICD-10-CM | POA: Diagnosis not present

## 2015-08-25 DIAGNOSIS — M4806 Spinal stenosis, lumbar region: Secondary | ICD-10-CM | POA: Diagnosis not present

## 2015-08-25 DIAGNOSIS — G309 Alzheimer's disease, unspecified: Secondary | ICD-10-CM | POA: Diagnosis not present

## 2015-08-27 DIAGNOSIS — G309 Alzheimer's disease, unspecified: Secondary | ICD-10-CM | POA: Diagnosis not present

## 2015-08-27 DIAGNOSIS — F0280 Dementia in other diseases classified elsewhere without behavioral disturbance: Secondary | ICD-10-CM | POA: Diagnosis not present

## 2015-08-27 DIAGNOSIS — M4806 Spinal stenosis, lumbar region: Secondary | ICD-10-CM | POA: Diagnosis not present

## 2015-08-27 DIAGNOSIS — I5032 Chronic diastolic (congestive) heart failure: Secondary | ICD-10-CM | POA: Diagnosis not present

## 2015-08-27 DIAGNOSIS — N183 Chronic kidney disease, stage 3 (moderate): Secondary | ICD-10-CM | POA: Diagnosis not present

## 2015-08-27 DIAGNOSIS — I130 Hypertensive heart and chronic kidney disease with heart failure and stage 1 through stage 4 chronic kidney disease, or unspecified chronic kidney disease: Secondary | ICD-10-CM | POA: Diagnosis not present

## 2015-08-30 ENCOUNTER — Other Ambulatory Visit: Payer: Self-pay | Admitting: Family Medicine

## 2015-09-01 DIAGNOSIS — I130 Hypertensive heart and chronic kidney disease with heart failure and stage 1 through stage 4 chronic kidney disease, or unspecified chronic kidney disease: Secondary | ICD-10-CM | POA: Diagnosis not present

## 2015-09-01 DIAGNOSIS — F0280 Dementia in other diseases classified elsewhere without behavioral disturbance: Secondary | ICD-10-CM | POA: Diagnosis not present

## 2015-09-01 DIAGNOSIS — G309 Alzheimer's disease, unspecified: Secondary | ICD-10-CM | POA: Diagnosis not present

## 2015-09-01 DIAGNOSIS — N183 Chronic kidney disease, stage 3 (moderate): Secondary | ICD-10-CM | POA: Diagnosis not present

## 2015-09-01 DIAGNOSIS — I5032 Chronic diastolic (congestive) heart failure: Secondary | ICD-10-CM | POA: Diagnosis not present

## 2015-09-01 DIAGNOSIS — M4806 Spinal stenosis, lumbar region: Secondary | ICD-10-CM | POA: Diagnosis not present

## 2015-09-03 DIAGNOSIS — G309 Alzheimer's disease, unspecified: Secondary | ICD-10-CM | POA: Diagnosis not present

## 2015-09-03 DIAGNOSIS — M4806 Spinal stenosis, lumbar region: Secondary | ICD-10-CM | POA: Diagnosis not present

## 2015-09-03 DIAGNOSIS — F0280 Dementia in other diseases classified elsewhere without behavioral disturbance: Secondary | ICD-10-CM | POA: Diagnosis not present

## 2015-09-03 DIAGNOSIS — I130 Hypertensive heart and chronic kidney disease with heart failure and stage 1 through stage 4 chronic kidney disease, or unspecified chronic kidney disease: Secondary | ICD-10-CM | POA: Diagnosis not present

## 2015-09-03 DIAGNOSIS — I5032 Chronic diastolic (congestive) heart failure: Secondary | ICD-10-CM | POA: Diagnosis not present

## 2015-09-03 DIAGNOSIS — N183 Chronic kidney disease, stage 3 (moderate): Secondary | ICD-10-CM | POA: Diagnosis not present

## 2015-09-04 DIAGNOSIS — I130 Hypertensive heart and chronic kidney disease with heart failure and stage 1 through stage 4 chronic kidney disease, or unspecified chronic kidney disease: Secondary | ICD-10-CM | POA: Diagnosis not present

## 2015-09-04 DIAGNOSIS — I5032 Chronic diastolic (congestive) heart failure: Secondary | ICD-10-CM | POA: Diagnosis not present

## 2015-09-04 DIAGNOSIS — G309 Alzheimer's disease, unspecified: Secondary | ICD-10-CM | POA: Diagnosis not present

## 2015-09-04 DIAGNOSIS — N183 Chronic kidney disease, stage 3 (moderate): Secondary | ICD-10-CM | POA: Diagnosis not present

## 2015-09-04 DIAGNOSIS — F0280 Dementia in other diseases classified elsewhere without behavioral disturbance: Secondary | ICD-10-CM | POA: Diagnosis not present

## 2015-09-04 DIAGNOSIS — M4806 Spinal stenosis, lumbar region: Secondary | ICD-10-CM | POA: Diagnosis not present

## 2015-09-07 DIAGNOSIS — I5032 Chronic diastolic (congestive) heart failure: Secondary | ICD-10-CM | POA: Diagnosis not present

## 2015-09-07 DIAGNOSIS — N183 Chronic kidney disease, stage 3 (moderate): Secondary | ICD-10-CM | POA: Diagnosis not present

## 2015-09-07 DIAGNOSIS — F0280 Dementia in other diseases classified elsewhere without behavioral disturbance: Secondary | ICD-10-CM | POA: Diagnosis not present

## 2015-09-07 DIAGNOSIS — I130 Hypertensive heart and chronic kidney disease with heart failure and stage 1 through stage 4 chronic kidney disease, or unspecified chronic kidney disease: Secondary | ICD-10-CM | POA: Diagnosis not present

## 2015-09-07 DIAGNOSIS — M4806 Spinal stenosis, lumbar region: Secondary | ICD-10-CM | POA: Diagnosis not present

## 2015-09-07 DIAGNOSIS — G309 Alzheimer's disease, unspecified: Secondary | ICD-10-CM | POA: Diagnosis not present

## 2015-09-09 DIAGNOSIS — M4806 Spinal stenosis, lumbar region: Secondary | ICD-10-CM | POA: Diagnosis not present

## 2015-09-09 DIAGNOSIS — F0280 Dementia in other diseases classified elsewhere without behavioral disturbance: Secondary | ICD-10-CM | POA: Diagnosis not present

## 2015-09-09 DIAGNOSIS — I130 Hypertensive heart and chronic kidney disease with heart failure and stage 1 through stage 4 chronic kidney disease, or unspecified chronic kidney disease: Secondary | ICD-10-CM | POA: Diagnosis not present

## 2015-09-09 DIAGNOSIS — I5032 Chronic diastolic (congestive) heart failure: Secondary | ICD-10-CM | POA: Diagnosis not present

## 2015-09-09 DIAGNOSIS — G309 Alzheimer's disease, unspecified: Secondary | ICD-10-CM | POA: Diagnosis not present

## 2015-09-09 DIAGNOSIS — N183 Chronic kidney disease, stage 3 (moderate): Secondary | ICD-10-CM | POA: Diagnosis not present

## 2015-09-10 DIAGNOSIS — I5032 Chronic diastolic (congestive) heart failure: Secondary | ICD-10-CM | POA: Diagnosis not present

## 2015-09-10 DIAGNOSIS — F0280 Dementia in other diseases classified elsewhere without behavioral disturbance: Secondary | ICD-10-CM | POA: Diagnosis not present

## 2015-09-10 DIAGNOSIS — M4806 Spinal stenosis, lumbar region: Secondary | ICD-10-CM | POA: Diagnosis not present

## 2015-09-10 DIAGNOSIS — N183 Chronic kidney disease, stage 3 (moderate): Secondary | ICD-10-CM | POA: Diagnosis not present

## 2015-09-10 DIAGNOSIS — I130 Hypertensive heart and chronic kidney disease with heart failure and stage 1 through stage 4 chronic kidney disease, or unspecified chronic kidney disease: Secondary | ICD-10-CM | POA: Diagnosis not present

## 2015-09-10 DIAGNOSIS — G309 Alzheimer's disease, unspecified: Secondary | ICD-10-CM | POA: Diagnosis not present

## 2015-09-14 ENCOUNTER — Ambulatory Visit (INDEPENDENT_AMBULATORY_CARE_PROVIDER_SITE_OTHER)
Admission: EM | Admit: 2015-09-14 | Discharge: 2015-09-14 | Disposition: A | Discharge status: Home / Self Care | Payer: Medicare Other | Source: Home / Self Care | Attending: Family Medicine | Admitting: Family Medicine

## 2015-09-14 ENCOUNTER — Encounter (HOSPITAL_COMMUNITY): Payer: Self-pay | Admitting: *Deleted

## 2015-09-14 ENCOUNTER — Telehealth: Payer: Self-pay | Admitting: Family Medicine

## 2015-09-14 ENCOUNTER — Observation Stay (HOSPITAL_COMMUNITY)
Admission: EM | Admit: 2015-09-14 | Discharge: 2015-09-15 | Disposition: A | Discharge status: Home / Self Care | Payer: Medicare Other | Attending: Family Medicine | Admitting: Family Medicine

## 2015-09-14 DIAGNOSIS — G309 Alzheimer's disease, unspecified: Secondary | ICD-10-CM | POA: Insufficient documentation

## 2015-09-14 DIAGNOSIS — R1310 Dysphagia, unspecified: Secondary | ICD-10-CM

## 2015-09-14 DIAGNOSIS — Z7982 Long term (current) use of aspirin: Secondary | ICD-10-CM | POA: Diagnosis not present

## 2015-09-14 DIAGNOSIS — K5909 Other constipation: Secondary | ICD-10-CM | POA: Insufficient documentation

## 2015-09-14 DIAGNOSIS — W44F3XA Food entering into or through a natural orifice, initial encounter: Secondary | ICD-10-CM | POA: Insufficient documentation

## 2015-09-14 DIAGNOSIS — G894 Chronic pain syndrome: Secondary | ICD-10-CM | POA: Insufficient documentation

## 2015-09-14 DIAGNOSIS — K2950 Unspecified chronic gastritis without bleeding: Secondary | ICD-10-CM | POA: Insufficient documentation

## 2015-09-14 DIAGNOSIS — Z791 Long term (current) use of non-steroidal anti-inflammatories (NSAID): Secondary | ICD-10-CM | POA: Diagnosis not present

## 2015-09-14 DIAGNOSIS — N183 Chronic kidney disease, stage 3 (moderate): Secondary | ICD-10-CM | POA: Diagnosis not present

## 2015-09-14 DIAGNOSIS — T18128A Food in esophagus causing other injury, initial encounter: Secondary | ICD-10-CM | POA: Diagnosis not present

## 2015-09-14 DIAGNOSIS — M109 Gout, unspecified: Secondary | ICD-10-CM | POA: Insufficient documentation

## 2015-09-14 DIAGNOSIS — I5032 Chronic diastolic (congestive) heart failure: Secondary | ICD-10-CM | POA: Diagnosis not present

## 2015-09-14 DIAGNOSIS — Z8673 Personal history of transient ischemic attack (TIA), and cerebral infarction without residual deficits: Secondary | ICD-10-CM | POA: Insufficient documentation

## 2015-09-14 DIAGNOSIS — F0280 Dementia in other diseases classified elsewhere without behavioral disturbance: Secondary | ICD-10-CM | POA: Diagnosis not present

## 2015-09-14 DIAGNOSIS — E039 Hypothyroidism, unspecified: Secondary | ICD-10-CM | POA: Insufficient documentation

## 2015-09-14 DIAGNOSIS — X58XXXA Exposure to other specified factors, initial encounter: Secondary | ICD-10-CM | POA: Diagnosis not present

## 2015-09-14 DIAGNOSIS — Z79899 Other long term (current) drug therapy: Secondary | ICD-10-CM | POA: Insufficient documentation

## 2015-09-14 DIAGNOSIS — I130 Hypertensive heart and chronic kidney disease with heart failure and stage 1 through stage 4 chronic kidney disease, or unspecified chronic kidney disease: Secondary | ICD-10-CM | POA: Diagnosis not present

## 2015-09-14 DIAGNOSIS — D130 Benign neoplasm of esophagus: Secondary | ICD-10-CM | POA: Diagnosis not present

## 2015-09-14 DIAGNOSIS — Z96619 Presence of unspecified artificial shoulder joint: Secondary | ICD-10-CM | POA: Insufficient documentation

## 2015-09-14 DIAGNOSIS — F329 Major depressive disorder, single episode, unspecified: Secondary | ICD-10-CM | POA: Insufficient documentation

## 2015-09-14 DIAGNOSIS — M159 Polyosteoarthritis, unspecified: Secondary | ICD-10-CM | POA: Diagnosis not present

## 2015-09-14 DIAGNOSIS — H409 Unspecified glaucoma: Secondary | ICD-10-CM | POA: Diagnosis not present

## 2015-09-14 DIAGNOSIS — K222 Esophageal obstruction: Secondary | ICD-10-CM | POA: Diagnosis present

## 2015-09-14 DIAGNOSIS — Z79891 Long term (current) use of opiate analgesic: Secondary | ICD-10-CM | POA: Insufficient documentation

## 2015-09-14 DIAGNOSIS — Z950 Presence of cardiac pacemaker: Secondary | ICD-10-CM | POA: Diagnosis not present

## 2015-09-14 DIAGNOSIS — D696 Thrombocytopenia, unspecified: Secondary | ICD-10-CM | POA: Diagnosis not present

## 2015-09-14 DIAGNOSIS — K224 Dyskinesia of esophagus: Secondary | ICD-10-CM | POA: Insufficient documentation

## 2015-09-14 DIAGNOSIS — K228 Other specified diseases of esophagus: Secondary | ICD-10-CM | POA: Diagnosis not present

## 2015-09-14 DIAGNOSIS — R198 Other specified symptoms and signs involving the digestive system and abdomen: Secondary | ICD-10-CM

## 2015-09-14 LAB — COMPREHENSIVE METABOLIC PANEL
ALBUMIN: 3.5 g/dL (ref 3.5–5.0)
ALT: 14 U/L (ref 14–54)
AST: 23 U/L (ref 15–41)
Alkaline Phosphatase: 97 U/L (ref 38–126)
Anion gap: 8 (ref 5–15)
BUN: 24 mg/dL — AB (ref 6–20)
CHLORIDE: 99 mmol/L — AB (ref 101–111)
CO2: 31 mmol/L (ref 22–32)
CREATININE: 1.2 mg/dL — AB (ref 0.44–1.00)
Calcium: 9.6 mg/dL (ref 8.9–10.3)
GFR calc Af Amer: 43 mL/min — ABNORMAL LOW (ref >60)
GFR, EST NON AFRICAN AMERICAN: 37 mL/min — AB (ref >60)
GLUCOSE: 104 mg/dL — AB (ref 65–99)
POTASSIUM: 4.2 mmol/L (ref 3.5–5.1)
SODIUM: 138 mmol/L (ref 135–145)
Total Bilirubin: 0.6 mg/dL (ref 0.3–1.2)
Total Protein: 7.4 g/dL (ref 6.5–8.1)

## 2015-09-14 LAB — CBC
HEMATOCRIT: 40.2 % (ref 36.0–46.0)
Hemoglobin: 13 g/dL (ref 12.0–15.0)
MCH: 32.2 pg (ref 26.0–34.0)
MCHC: 32.3 g/dL (ref 30.0–36.0)
MCV: 99.5 fL (ref 78.0–100.0)
PLATELETS: 72 10*3/uL — AB (ref 150–400)
RBC: 4.04 MIL/uL (ref 3.87–5.11)
RDW: 13.9 % (ref 11.5–15.5)
WBC: 9.8 10*3/uL (ref 4.0–10.5)

## 2015-09-14 LAB — I-STAT TROPONIN, ED: Troponin i, poc: 0.01 ng/mL (ref 0.00–0.08)

## 2015-09-14 MED ORDER — SODIUM CHLORIDE 0.9 % IV BOLUS (SEPSIS)
500.0000 mL | Freq: Once | INTRAVENOUS | Status: AC
  Administered 2015-09-14: 500 mL via INTRAVENOUS

## 2015-09-14 NOTE — ED Notes (Signed)
PT family reports that since after eating chinese food last night, she feels as though when she tries to eat or drink something it stays in her chest/epigastric area and it does not go all the way through, and it comes back up. Pt was seen at urgent care prior and sent here for evaluation. Pt has not eaten since event last night. Pt in no acute distress, airway intact and handling secretions.

## 2015-09-14 NOTE — ED Notes (Signed)
[facility] @ [phone]

## 2015-09-14 NOTE — ED Notes (Signed)
Patient to be transferred via shuttle to ED. Report called to NF. Airway remains intact.

## 2015-09-14 NOTE — ED Notes (Signed)
Family reports last night while eating dinner around 7p patient started having trouble swallowing, states that it feels like something is stuck in her throat. During triage patient spitting into napkin. Airway is intact, denies any sob. Patient points to mid upper abdominal region when asked where if feels like something stuck, states like something "inflammed". Patient has not been able to eat since last night and says she feels exhausted.

## 2015-09-14 NOTE — Telephone Encounter (Signed)
Pt. Daughter called because she says that her mom has not been taking po well over the past 24 hours. She thinks that she may be getting sick. She says that she has stopped taking PO last night and is not wanting liquids or solids. She denied any slurred speech, weakness, numbness, or difficulty getting around. She has not had fever, chills, or abdominal pain. She was hoping to get an appointment at the [facility] today, but unfortunately we are closed due to 1/2 day didactic with the clinic staff and providers. I recommended that she either go to the Urgent Care or to the ED. The daughter agreed and says that she will take her to the Urgent Care if she can. I told her that if she had any weakness, numbness, or difficulty speaking, then she should take her mother to the ED as this may be signs of a stroke.   Paula Compton, MD Family Medicine - PGY 2

## 2015-09-14 NOTE — ED Provider Notes (Signed)
CSN: [id number]     Arrival date & time 09/14/15  1731 History   First MD Initiated Contact with Patient 09/14/15 1839     Chief Complaint  Patient presents with  . Dysphagia   (Consider location/radiation/quality/duration/timing/severity/associated sxs/prior Treatment) HPI Comments: 82 year old female is accompanied by her English-speaking daughter stating that during the evening meal yesterday afternoon she felt like she got something stuck in her esophagus. She has not been able to eat or drink since then. She is able to swallow water sometimes other times she has regurgitation with it. She feels as though something is "stuck" just above the stomach. Denies chest pain, shortness of breath or abdominal pain.   Past Medical History  Diagnosis Date  . Cervicalgia   . Peripheral neuropathy ([place])   . Hypertension   . Osteoporosis   . Insomnia   . Arthritis   . Headache   . Hypothyroidism   . Right rotator cuff tear   . OA (osteoarthritis)     RIGHT SHOULDER AC JOINT  . Moderate dementia   . Gait instability   . Complete heart block ([place])   . History of colon polyps   . Diverticulosis of colon   . SUI (stress urinary incontinence, female)   . Lumbar stenosis   . Cryptogenic stroke ([place]) S/P  LOOP RECORDER IMPLANT    DX  01/2013  --  SYNCOPAL EPISODE --  EPISODIC  ATRIAL TACHY/ FIBULATION  AND PAUSES  . Frequency of urination   . Chronic constipation   . Glaucoma of both eyes   . Hearing loss of both ears     no hearing aids  . Dry eyes   . Cervicalgia 05/18/2009    Qualifier: Diagnosis of  By: Walker Kehr MD, Patrick Jupiter    . DIVERTICULOSIS OF COLON 06/29/2006    Qualifier: Diagnosis of  By: Benna Dunks    . Combined visual hearing impairment 11/12/2013  . Urinary incontinence, mixed 06/29/2006    Qualifier: Diagnosis of  By: Benna Dunks    . INTERSTITIAL CYSTITIS 09/21/2009    Qualifier: Diagnosis of  By: Sarita Haver  MD, Coralyn Mark    . Syncope and collapse 02/09/2013  . Right knee pain  01/15/2014  . Polymyalgia rheumatica ([place]) 11/08/2013  . Loss of weight 04/05/2010    Qualifier: Diagnosis of  By: Walker Kehr MD, Patrick Jupiter    . Macular degeneration, age related, nonexudative 04/18/2014  . Hearing loss of aging 05/09/2014  . Presence of permanent cardiac pacemaker   . Shortness of breath dyspnea   . Pacemaker   . Constipation   . Weakness 08/29/2013  . Urinary frequency 06/05/2015  . Shortness of breath 07/21/2014  . Persistent headaches 09/06/2013  . Rotator cuff tear arthropathy 09/11/2012    Likely related to rotator cuff tendinopathy seen on musculoskeletal ultrasound, and GH DJD.  Complete tear of right supra and infraspinatus on MRI 10/2012     . S/P shoulder replacement 03/20/2015  . Spinal stenosis of lumbar region 10/03/2012    Patient has significant low back pain related to spinal stenosis and DJD, DDD.  We had a long discussion about treatment options which are quite limited. She is likely not a good surgical candidate for laminectomy. I am not sure about the usefulness of epidural steroid injection. We'll plan on restarting meloxicam after a long discussion of the pros and cons and risks of chronic kidney disease.  The patient and her family feel that the benefit of good pain control is the  small risk.  Additionally she will followup with her primary care provider for creatinine monitoring.  Will obtain physical therapy for TENS unit trial.  Will refer to pain management for consideration of epidural steroid injection as that worked well in the past for cervical pain Followup here as needed   . PERIPHERAL NEUROPATHY 03/29/2010    Qualifier: Diagnosis of  By: Walker Kehr MD, Patrick Jupiter    . 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 OSTEOPOROSIS 03/20/2008    Qualifier: Diagnosis of  By: Jerline Pain MD, Anderson Malta    . OSTEOARTHRITIS, MULTI SITES 06/29/2006    Qualifier: Diagnosis of  By: Benna Dunks    . Mobitz type 2 second degree heart block 08/29/2013  .  MASS, LUNG 04/05/2010    Qualifier: Diagnosis of  By: Walker Kehr MD, Patrick Jupiter    . Impaired mobility and ADLs 05/30/2014  . Hyperlipidemia 02/27/2015  . HTN (hypertension) 06/29/2006    Isolated Systolic Hypertension - Ambulatory BP  Monitoring result 4/28-29/2015   . HERNIA, INCISIONAL VENTRAL W/O OBST/GNGR 10/25/2006    Has been seen by surgery in the past and told that this is not dangerous and should only be treated particularly bothersome    . Gout 09/21/2013  . Glaucoma 04/18/2014  . Depressive disorder 06/29/2006        . CYSTOCELE/RECTOCELE/PROLAPSE,UNSPEC. 06/29/2006    Qualifier: Diagnosis of  By: Benna Dunks    . CKD (chronic kidney disease) stage 3, GFR 30-59 ml/min 09/03/2013  . Chronic pain syndrome 05/09/2014  . Chronic diastolic heart failure ([place]) 09/01/2013  . Carpal tunnel syndrome 06/29/2006    Qualifier: Diagnosis of  By: Benna Dunks    . Alzheimer's dementia 07/30/2013    04/17/14 MoCA - Blind (Spanish version); 9 out of 22.  Correct MoCA = 12 out of 30.     Past Surgical History  Procedure Laterality Date  . Loop recorder implant  02-08-13; 08-30-13    MDT LinQ implanted by Dr Lovena Le for syncope, explanted 08-30-13 after CHB identified  . Total abdominal hysterectomy w/ bilateral salpingoophorectomy  03-15-2005  . Carpal tunnel release Right   . Eye surgery      cataract, bilat.  . Pacemaker insertion  08-30-2013    MDT ADDRL1 pacemaker implanted by Dr Rayann Heman for CHB  . Loop recorder implant N/A 02/11/2013    Procedure: LOOP RECORDER IMPLANT;  Surgeon: Evans Lance, MD;  Location: [facility] CATH LAB;  Service: Cardiovascular;  Laterality: N/A;  . Permanent pacemaker insertion N/A 08/30/2013    Procedure: PERMANENT PACEMAKER INSERTION;  Surgeon: Coralyn Mark, MD;  Location: [facility] CATH LAB;  Service: Cardiovascular;  Laterality: N/A;  . Insert / replace / remove pacemaker    . Reverse shoulder arthroplasty Right 03/20/2015    Procedure: RIGHT REVERSE SHOULDER ARTHROPLASTY;  Surgeon: Netta Cedars, MD;  Location: [facility];  Service: Orthopedics;  Laterality: Right;   Family History  Problem Relation Age of Onset  . Kidney disease Mother   . Heart attack Neg Hx   . Stroke Neg Hx    Social History  Substance Use Topics  . Smoking status: Never Smoker   . Smokeless tobacco: None  . Alcohol Use: No   OB History    Gravida Para Term Preterm AB TAB SAB Ectopic Multiple Living   0 0 0 0 0 0 0 0       Review of Systems  Constitutional: Positive for activity change. Negative for fever.  HENT: Negative for congestion.        Drooling at times. Able to handle oral secretions at this time.  Respiratory: Negative for cough and shortness of breath.   Cardiovascular: Negative for chest pain.  Gastrointestinal: Negative for vomiting and abdominal pain.  Genitourinary: Negative.   Musculoskeletal: Negative.   Skin: Negative.   Neurological: Negative.   All other systems reviewed and are negative.   Allergies  Chlorthalidone; Honey bee treatment; Verapamil; Adhesive; and Amlodipine  Home Medications   Prior to Admission medications   Medication Sig Start Date End Date Taking? Authorizing Provider  acetaminophen (TYLENOL) 325 MG tablet Take 650 mg by mouth every 6 (six) hours as needed for mild pain.    Yes Historical Provider, MD  allopurinol (ZYLOPRIM) 100 MG tablet Take 1 tablet (100 mg total) by mouth daily. 11/17/14  Yes Smiley Houseman, MD  beta carotene w/minerals (OCUVITE) tablet Take 1 tablet by mouth daily. 04/17/14  Yes Coral Spikes, DO  Calcium Carbonate-Vitamin D (CALTRATE 600+D) 600-400 MG-UNIT per tablet Take 1 tablet by mouth 2 (two) times daily.     Yes Historical Provider, MD  cycloSPORINE (RESTASIS) 0.05 % ophthalmic emulsion Place 1 drop into both eyes at bedtime.   Yes Historical Provider, MD  diclofenac sodium (VOLTAREN) 1 % GEL apply 2 grams to affected area four times a day if needed for pain 07/09/15  Yes Blane Ohara McDiarmid, MD  DULoxetine (CYMBALTA) 60  MG capsule take 1 capsule by mouth once daily 08/31/15  Yes Lind Covert, MD  esomeprazole (NEXIUM) 40 MG capsule take 1 capsule by mouth twice a day 08/25/15  Yes Blane Ohara McDiarmid, MD  fesoterodine (TOVIAZ) 8 MG TB24 tablet Take 1 tablet (8 mg total) by mouth daily. 06/04/15  Yes Blane Ohara McDiarmid, MD  gabapentin (NEURONTIN) 100 MG capsule take 2 capsules by mouth three times a day 07/09/15  Yes Todd D McDiarmid, MD  levothyroxine (SYNTHROID, LEVOTHROID) 100 MCG tablet take 1 tablet by mouth once daily 06/29/15  Yes Todd D McDiarmid, MD  lidocaine (XYLOCAINE) 5 % ointment apply to affected area once daily if needed ( DO NOT EXCEED 5 GRAMS OF OINTMENT) 07/31/15  Yes Blane Ohara McDiarmid, MD  lidocaine (XYLOCAINE) 5 % ointment apply to affected area once daily if needed ( DO NOT EXCEED 5 GRAMS OF OINTMENT) 07/31/15  Yes Blane Ohara McDiarmid, MD  RA ASPIRIN EC 81 MG EC tablet take 1 tablet by mouth once daily 06/01/15  Yes Smiley Houseman, MD  RA COL-RITE 100 MG capsule take 1 capsule by mouth once daily if needed 07/09/15  Yes Blane Ohara McDiarmid, MD  sucralfate (CARAFATE) 1 g tablet Take 1 tablet (1 g total) by mouth 4 (four) times daily -  with meals and at bedtime. 06/09/15  Yes Todd D McDiarmid, MD  TRAVATAN Z 0.004 % SOLN ophthalmic solution Place 1 drop into both eyes at bedtime.  02/20/13  Yes Historical Provider, MD  traZODone (DESYREL) 50 MG tablet take 1 tablet by mouth at bedtime 07/02/15  Yes Todd D McDiarmid, MD  colchicine 0.6 MG tablet Take 1 tablet (0.6 mg total) by mouth 2 (two) times daily as needed (GOUT). 07/09/15   Blane Ohara McDiarmid, MD  furosemide (LASIX) 20 MG tablet Take 1 tablet (20 mg total) by mouth daily. 07/09/15   Blane Ohara McDiarmid, MD  ibuprofen (ADVIL,MOTRIN) 100 MG chewable tablet Chew 100-200 mg by mouth every 8 (eight) hours as needed (for pain.).  Historical Provider, MD  ondansetron (ZOFRAN) 4 MG tablet Take 2 tablets (8 mg total) by mouth every 8 (eight) hours as needed for nausea or  vomiting. 07/09/15   Blane Ohara McDiarmid, MD  oxyCODONE-acetaminophen (ROXICET) 5-325 MG tablet Take 1-2 tablets by mouth every 4 (four) hours as needed for severe pain. 03/20/15   Netta Cedars, MD  polyethylene glycol [facility]) packet Take 17 g by mouth daily. Patient taking differently: Take 17 g by mouth daily as needed for mild constipation.  07/22/14   Leone Haven, MD   Meds Ordered and Administered this Visit  Medications - No data to display  BP 145/77 mmHg  Pulse 87  Temp(Src) 98.5 F (36.9 C) (Oral)  Resp 16  SpO2 99% No data found.   Physical Exam  Constitutional: She is oriented to person, place, and time. She appears well-developed and well-nourished. No distress.  Eyes: EOM are normal.  Neck: Normal range of motion. Neck supple.  Cardiovascular: Normal rate, regular rhythm and normal heart sounds.   Pulmonary/Chest: Effort normal and breath sounds normal. No respiratory distress. She has no rales.  Rare distant wheeze.  Abdominal: Soft. There is no tenderness.  Lymphadenopathy:    She has no cervical adenopathy.  Neurological: She is alert and oriented to person, place, and time. She exhibits normal muscle tone.  Skin: Skin is warm and dry.  Psychiatric: She has a normal mood and affect.  Nursing note and vitals reviewed.   ED Course  Procedures (including critical care time)  Labs Review Labs Reviewed - No data to display  Imaging Review No results found.   Visual Acuity Review  Right Eye Distance:   Left Eye Distance:   Bilateral Distance:    Right Eye Near:   Left Eye Near:    Bilateral Near:         MDM   1. Dysphagia   2. Sensation of foreign body in esophagus    History suggest foreign body in the esophagus likely at or near the LES. Patient is stable. Airway and breathing is normal. Transfer to the emergency department via shuttle for assessment and treatment.    Janne Napoleon, NP 09/14/15 1930

## 2015-09-15 ENCOUNTER — Encounter (HOSPITAL_COMMUNITY)
Admission: EM | Disposition: A | Discharge status: Home / Self Care | Payer: Self-pay | Source: Home / Self Care | Attending: Emergency Medicine

## 2015-09-15 ENCOUNTER — Encounter (HOSPITAL_COMMUNITY): Payer: Self-pay | Admitting: *Deleted

## 2015-09-15 DIAGNOSIS — R1310 Dysphagia, unspecified: Secondary | ICD-10-CM

## 2015-09-15 DIAGNOSIS — D130 Benign neoplasm of esophagus: Secondary | ICD-10-CM | POA: Diagnosis not present

## 2015-09-15 DIAGNOSIS — T18128A Food in esophagus causing other injury, initial encounter: Secondary | ICD-10-CM | POA: Insufficient documentation

## 2015-09-15 DIAGNOSIS — K224 Dyskinesia of esophagus: Secondary | ICD-10-CM | POA: Diagnosis not present

## 2015-09-15 DIAGNOSIS — K222 Esophageal obstruction: Secondary | ICD-10-CM | POA: Diagnosis not present

## 2015-09-15 DIAGNOSIS — K228 Other specified diseases of esophagus: Secondary | ICD-10-CM | POA: Diagnosis not present

## 2015-09-15 HISTORY — DX: Dysphagia, unspecified: R13.10

## 2015-09-15 HISTORY — PX: ESOPHAGOGASTRODUODENOSCOPY: SHX5428

## 2015-09-15 LAB — CBC WITH DIFFERENTIAL/PLATELET
Basophils Absolute: 0 10*3/uL (ref 0.0–0.1)
Basophils Relative: 0 %
EOS ABS: 0.1 10*3/uL (ref 0.0–0.7)
Eosinophils Relative: 2 %
HCT: 35.2 % — ABNORMAL LOW (ref 36.0–46.0)
HEMOGLOBIN: 11 g/dL — AB (ref 12.0–15.0)
LYMPHS ABS: 2.1 10*3/uL (ref 0.7–4.0)
LYMPHS PCT: 27 %
MCH: 31.3 pg (ref 26.0–34.0)
MCHC: 31.3 g/dL (ref 30.0–36.0)
MCV: 100 fL (ref 78.0–100.0)
MONOS PCT: 7 %
Monocytes Absolute: 0.6 10*3/uL (ref 0.1–1.0)
NEUTROS PCT: 64 %
Neutro Abs: 5 10*3/uL (ref 1.7–7.7)
Platelets: 62 10*3/uL — ABNORMAL LOW (ref 150–400)
RBC: 3.52 MIL/uL — AB (ref 3.87–5.11)
RDW: 14.1 % (ref 11.5–15.5)
WBC: 7.9 10*3/uL (ref 4.0–10.5)

## 2015-09-15 LAB — COMPREHENSIVE METABOLIC PANEL
ALBUMIN: 2.8 g/dL — AB (ref 3.5–5.0)
ALK PHOS: 77 U/L (ref 38–126)
ALT: 13 U/L — AB (ref 14–54)
ANION GAP: 10 (ref 5–15)
AST: 20 U/L (ref 15–41)
BUN: 23 mg/dL — ABNORMAL HIGH (ref 6–20)
CALCIUM: 9 mg/dL (ref 8.9–10.3)
CHLORIDE: 101 mmol/L (ref 101–111)
CO2: 28 mmol/L (ref 22–32)
CREATININE: 1.18 mg/dL — AB (ref 0.44–1.00)
GFR calc Af Amer: 44 mL/min — ABNORMAL LOW (ref >60)
GFR calc non Af Amer: 38 mL/min — ABNORMAL LOW (ref >60)
GLUCOSE: 92 mg/dL (ref 65–99)
Potassium: 4 mmol/L (ref 3.5–5.1)
SODIUM: 139 mmol/L (ref 135–145)
Total Bilirubin: 0.5 mg/dL (ref 0.3–1.2)
Total Protein: 5.8 g/dL — ABNORMAL LOW (ref 6.5–8.1)

## 2015-09-15 LAB — TSH: TSH: 2.137 u[IU]/mL (ref 0.350–4.500)

## 2015-09-15 SURGERY — EGD (ESOPHAGOGASTRODUODENOSCOPY)
Anesthesia: Moderate Sedation

## 2015-09-15 MED ORDER — DIPHENHYDRAMINE HCL 50 MG/ML IJ SOLN
INTRAMUSCULAR | Status: AC
  Filled 2015-09-15: qty 1

## 2015-09-15 MED ORDER — GABAPENTIN 100 MG PO CAPS
200.0000 mg | ORAL_CAPSULE | Freq: Three times a day (TID) | ORAL | Status: DC
  Administered 2015-09-15 (×3): 200 mg via ORAL
  Filled 2015-09-15 (×3): qty 2

## 2015-09-15 MED ORDER — SODIUM CHLORIDE 0.9 % IV SOLN
INTRAVENOUS | Status: DC
  Administered 2015-09-15: 02:00:00 via INTRAVENOUS

## 2015-09-15 MED ORDER — DULOXETINE HCL 60 MG PO CPEP
60.0000 mg | ORAL_CAPSULE | Freq: Every day | ORAL | Status: DC
  Administered 2015-09-15: 60 mg via ORAL
  Filled 2015-09-15: qty 1

## 2015-09-15 MED ORDER — FENTANYL CITRATE (PF) 100 MCG/2ML IJ SOLN
INTRAMUSCULAR | Status: AC
  Filled 2015-09-15: qty 2

## 2015-09-15 MED ORDER — LEVOTHYROXINE SODIUM 100 MCG PO TABS
100.0000 ug | ORAL_TABLET | Freq: Every day | ORAL | Status: DC
  Administered 2015-09-15: 100 ug via ORAL
  Filled 2015-09-15: qty 1

## 2015-09-15 MED ORDER — FENTANYL CITRATE (PF) 100 MCG/2ML IJ SOLN
INTRAMUSCULAR | Status: DC | PRN
  Administered 2015-09-15: 25 ug via INTRAVENOUS
  Administered 2015-09-15: 12.5 ug via INTRAVENOUS

## 2015-09-15 MED ORDER — ONDANSETRON HCL 4 MG/2ML IJ SOLN: 4.0000 mg | Freq: Four times a day (QID) | INTRAMUSCULAR | Status: DC | PRN

## 2015-09-15 MED ORDER — ONDANSETRON HCL 4 MG PO TABS: 4.0000 mg | ORAL_TABLET | Freq: Four times a day (QID) | ORAL | Status: DC | PRN

## 2015-09-15 MED ORDER — ONDANSETRON HCL 4 MG/2ML IJ SOLN: 4.0000 mg | Freq: Three times a day (TID) | INTRAMUSCULAR | Status: DC | PRN

## 2015-09-15 MED ORDER — ALLOPURINOL 100 MG PO TABS
100.0000 mg | ORAL_TABLET | Freq: Every day | ORAL | Status: DC
  Administered 2015-09-15: 100 mg via ORAL
  Filled 2015-09-15: qty 1

## 2015-09-15 MED ORDER — CYCLOSPORINE 0.05 % OP EMUL
1.0000 [drp] | Freq: Every day | OPHTHALMIC | Status: DC
  Administered 2015-09-15: 1 [drp] via OPHTHALMIC
  Filled 2015-09-15 (×2): qty 1

## 2015-09-15 MED ORDER — MIDAZOLAM HCL 5 MG/ML IJ SOLN
INTRAMUSCULAR | Status: AC
  Filled 2015-09-15: qty 2

## 2015-09-15 MED ORDER — BUTAMBEN-TETRACAINE-BENZOCAINE 2-2-14 % EX AERO
INHALATION_SPRAY | CUTANEOUS | Status: DC | PRN
  Administered 2015-09-15: 2 via TOPICAL

## 2015-09-15 MED ORDER — SODIUM CHLORIDE 0.9 % IV SOLN: INTRAVENOUS | Status: DC

## 2015-09-15 MED ORDER — ENOXAPARIN SODIUM 30 MG/0.3ML ~~LOC~~ SOLN: 30.0000 mg | Freq: Every day | SUBCUTANEOUS | Status: DC

## 2015-09-15 MED ORDER — MIDAZOLAM HCL 10 MG/2ML IJ SOLN
INTRAMUSCULAR | Status: DC | PRN
  Administered 2015-09-15: 1 mg via INTRAVENOUS
  Administered 2015-09-15: 2 mg via INTRAVENOUS
  Administered 2015-09-15: 1 mg via INTRAVENOUS

## 2015-09-15 NOTE — Evaluation (Signed)
Clinical/Bedside Swallow Evaluation Patient Details  Name: LENAE BODOH MRN: [id number] Date of Birth: 1919-09-24  Today's Date: 09/15/2015 Time: SLP Start Time (ACUTE ONLY): 1140 SLP Stop Time (ACUTE ONLY): 1150 SLP Time Calculation (min) (ACUTE ONLY): 10 min  Past Medical History:  Past Medical History  Diagnosis Date  . Cervicalgia   . Peripheral neuropathy ([place])   . Hypertension   . Osteoporosis   . Insomnia   . Arthritis   . Headache   . Hypothyroidism   . Right rotator cuff tear   . OA (osteoarthritis)     RIGHT SHOULDER AC JOINT  . Moderate dementia   . Gait instability   . Complete heart block ([place])   . History of colon polyps   . Diverticulosis of colon   . SUI (stress urinary incontinence, female)   . Lumbar stenosis   . Cryptogenic stroke ([place]) S/P  LOOP RECORDER IMPLANT    DX  01/2013  --  SYNCOPAL EPISODE --  EPISODIC  ATRIAL TACHY/ FIBULATION  AND PAUSES  . Frequency of urination   . Chronic constipation   . Glaucoma of both eyes   . Hearing loss of both ears     no hearing aids  . Dry eyes   . Cervicalgia 05/18/2009    Qualifier: Diagnosis of  By: Walker Kehr MD, Patrick Jupiter    . DIVERTICULOSIS OF COLON 06/29/2006    Qualifier: Diagnosis of  By: Benna Dunks    . Combined visual hearing impairment 11/12/2013  . Urinary incontinence, mixed 06/29/2006    Qualifier: Diagnosis of  By: Benna Dunks    . INTERSTITIAL CYSTITIS 09/21/2009    Qualifier: Diagnosis of  By: Sarita Haver  MD, Coralyn Mark    . Syncope and collapse 02/09/2013  . Right knee pain 01/15/2014  . Polymyalgia rheumatica ([place]) 11/08/2013  . Loss of weight 04/05/2010    Qualifier: Diagnosis of  By: Walker Kehr MD, Patrick Jupiter    . Macular degeneration, age related, nonexudative 04/18/2014  . Hearing loss of aging 05/09/2014  . Presence of permanent cardiac pacemaker   . Shortness of breath dyspnea   . Pacemaker   . Constipation   . Weakness 08/29/2013  . Urinary frequency 06/05/2015  . Shortness of breath 07/21/2014  . Persistent  headaches 09/06/2013  . Rotator cuff tear arthropathy 09/11/2012    Likely related to rotator cuff tendinopathy seen on musculoskeletal ultrasound, and GH DJD.  Complete tear of right supra and infraspinatus on MRI 10/2012     . S/P shoulder replacement 03/20/2015  . Spinal stenosis of lumbar region 10/03/2012    Patient has significant low back pain related to spinal stenosis and DJD, DDD.  We had a long discussion about treatment options which are quite limited. She is likely not a good surgical candidate for laminectomy. I am not sure about the usefulness of epidural steroid injection. We'll plan on restarting meloxicam after a long discussion of the pros and cons and risks of chronic kidney disease.  The patient and her family feel that the benefit of good pain control is the small risk.  Additionally she will followup with her primary care provider for creatinine monitoring.  Will obtain physical therapy for TENS unit trial.  Will refer to pain management for consideration of epidural steroid injection as that worked well in the past for cervical pain Followup here as needed   . PERIPHERAL NEUROPATHY 03/29/2010    Qualifier: Diagnosis of  By: Walker Kehr MD, Patrick Jupiter    . 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 OSTEOPOROSIS 03/20/2008    Qualifier: Diagnosis of  By: Jerline Pain MD, Anderson Malta    . OSTEOARTHRITIS, MULTI SITES 06/29/2006    Qualifier: Diagnosis of  By: Benna Dunks    . Mobitz type 2 second degree heart block 08/29/2013  . MASS, LUNG 04/05/2010    Qualifier: Diagnosis of  By: Walker Kehr MD, Patrick Jupiter    . Impaired mobility and ADLs 05/30/2014  . Hyperlipidemia 02/27/2015  . HTN (hypertension) 06/29/2006    Isolated Systolic Hypertension - Ambulatory BP  Monitoring result 4/28-29/2015   . HERNIA, INCISIONAL VENTRAL W/O OBST/GNGR 10/25/2006    Has been seen by surgery in the past and told that this is not dangerous and should only be treated particularly bothersome     . Gout 09/21/2013  . Glaucoma 04/18/2014  . Depressive disorder 06/29/2006        . CYSTOCELE/RECTOCELE/PROLAPSE,UNSPEC. 06/29/2006    Qualifier: Diagnosis of  By: Benna Dunks    . CKD (chronic kidney disease) stage 3, GFR 30-59 ml/min 09/03/2013  . Chronic pain syndrome 05/09/2014  . Chronic diastolic heart failure ([place]) 09/01/2013  . Carpal tunnel syndrome 06/29/2006    Qualifier: Diagnosis of  By: Benna Dunks    . Alzheimer's dementia 07/30/2013    04/17/14 MoCA - Blind (Spanish version); 9 out of 22.  Correct MoCA = 12 out of 30.     Past Surgical History:  Past Surgical History  Procedure Laterality Date  . Loop recorder implant  02-08-13; 08-30-13    MDT LinQ implanted by Dr Lovena Le for syncope, explanted 08-30-13 after CHB identified  . Total abdominal hysterectomy w/ bilateral salpingoophorectomy  03-15-2005  . Carpal tunnel release Right   . Eye surgery      cataract, bilat.  . Pacemaker insertion  08-30-2013    MDT ADDRL1 pacemaker implanted by Dr Rayann Heman for CHB  . Loop recorder implant N/A 02/11/2013    Procedure: LOOP RECORDER IMPLANT;  Surgeon: Evans Lance, MD;  Location: [facility] CATH LAB;  Service: Cardiovascular;  Laterality: N/A;  . Permanent pacemaker insertion N/A 08/30/2013    Procedure: PERMANENT PACEMAKER INSERTION;  Surgeon: Coralyn Mark, MD;  Location: [facility] CATH LAB;  Service: Cardiovascular;  Laterality: N/A;  . Insert / replace / remove pacemaker    . Reverse shoulder arthroplasty Right 03/20/2015    Procedure: RIGHT REVERSE SHOULDER ARTHROPLASTY;  Surgeon: Netta Cedars, MD;  Location: [facility];  Service: Orthopedics;  Laterality: Right;   HPI:  KAMALJIT SOLAR is a 82 y.o. female presenting with dysphagia . PMH is significant for Pacemaker 2/2 3rd degree AV block, hx of gout, GERD, HTN, HLD, Depression, CKD, HFpEF, Hypothyroidism. Per daughter who translated for her mother, patient was eating chinese food and soup on Sunday night, when she had to stop after a few bites, due to the  sensation of food being stuck in her throat. Since then patient has not been able to eat any liquids or solids. She state every time she tries they come right back up and she begins producing a lot of thick saliva. Patient has had dsyphagia in the past, several months ago, however it quickly resolved at that time. She has never had an endoscopy in the past. Pt had an endoscopy and food impaction was found and removed, also found chronic gastritis.    Assessment / Plan / Recommendation Clinical Impression  Pt demonstrates no sign of oral or oropharyngeal function with  thin liquids. Her and her son report no difficulty with dentrues or mastication, though pt was not observed with solids due to restriction of liquids only by GI. Offered suggestions to chop and moisten foods, utilize sips of liquids inbetween bites to facilitate clearance in Spanish to pt and son. No SLP f/u needed, will sign off.     Aspiration Risk  Mild aspiration risk    Diet Recommendation Thin liquid;Dysphagia 3 (Mech soft)   Liquid Administration via: Cup;Straw Medication Administration: Whole meds with liquid Supervision: Patient able to self feed Compensations: Slow rate;Small sips/bites;Follow solids with liquid Postural Changes: Seated upright at 90 degrees;Remain upright for at least 30 minutes after po intake    Other  Recommendations Oral Care Recommendations: Oral care BID   Follow up Recommendations       Frequency and Duration            Prognosis        Swallow Study   General HPI: KERIA DLUGOSZ is a 82 y.o. female presenting with dysphagia . PMH is significant for Pacemaker 2/2 3rd degree AV block, hx of gout, GERD, HTN, HLD, Depression, CKD, HFpEF, Hypothyroidism. Per daughter who translated for her mother, patient was eating chinese food and soup on Sunday night, when she had to stop after a few bites, due to the sensation of food being stuck in her throat. Since then patient has not been able to eat any  liquids or solids. She state every time she tries they come right back up and she begins producing a lot of thick saliva. Patient has had dsyphagia in the past, several months ago, however it quickly resolved at that time. She has never had an endoscopy in the past. Pt had an endoscopy and food impaction was found and removed, also found chronic gastritis.  Type of Study: Bedside Swallow Evaluation Diet Prior to this Study: Thin liquids Temperature Spikes Noted: No Respiratory Status: Room air History of Recent Intubation: No Behavior/Cognition: Alert;Cooperative;Pleasant mood Oral Cavity Assessment: Within Functional Limits Oral Care Completed by SLP: No Oral Cavity - Dentition: Dentures, top;Dentures, bottom Vision: Functional for self-feeding Self-Feeding Abilities: Able to feed self Patient Positioning: Upright in bed Baseline Vocal Quality: Normal Volitional Cough: Strong Volitional Swallow: Able to elicit    Oral/Motor/Sensory Function Overall Oral Motor/Sensory Function: Within functional limits   Ice Chips     Thin Liquid Thin Liquid: Within functional limits Presentation: Straw;Self Fed    Nectar Thick Nectar Thick Liquid: Not tested   Honey Thick Honey Thick Liquid: Not tested   Puree Puree: Not tested   Solid   GO   Solid: Not tested       Herbie Baltimore, MA CCC-SLP [id number]  Vyron Fronczak, Katherene Ponto 09/15/2015,1:12 PM

## 2015-09-15 NOTE — Progress Notes (Signed)
Pt. In ENDO at start of my shift.

## 2015-09-15 NOTE — Care Management Obs Status (Signed)
[place] NOTIFICATION   Patient Details  Name: Jordan [name] MRN: [id number] Date of Birth: [birth year]   Medicare Observation Status Notification Given:  Yes (episodes of dysphagia , endo procedure )  Given and explained to patient's daughter Gaspar Garbe, RN 09/15/2015, 10:06 AM

## 2015-09-15 NOTE — H&P (Deleted)
[facility] Admission History and Physical Service Pager: [phone]  [name] Medical record number: [id number] Date of birth: [birth year] Age: 82 y.o. Gender: female  Primary Care Provider: MCDIARMID,TODD D, MD Consultants: GI  Code Status: Full   Chief Complaint: Dysphagia   Assessment and Plan: KANDA ELRICK is a 82 y.o. female presenting with dysphagia . 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 Patient with recent hx of starting a PPI about 1 year ago, per patient this was due to compliant of increased thickened secretions when swallowing similar in a way to current episode, prior episodes of dysphagia in the past that quickly self-resolved. No recent hx of viral infections make eosinophilic esophagitis less likely. Interesting that this does not seem to be progressive in nature, which would have pointed towards cancer or stricture and relatively acute, however it is still high on my differntial due to patient's age. Though acute in nature, unlikely neurologic a patient is neurologically intact. CBC  Wnl, BMET wnl  - Admit to med-surg, Dr. Andria Frames - AM CBC with diff to check of eosinophils , CMET, and TSH  - MIVF  - IV Zofran  - NPO  - GI consulted in by the ED, Dr. Amedeo Plenty will need an endoscopy in the AM  - GI recs   Hypothyroidism : no goiter noted on exam  - TSH - Holding synthroid for now   CKD III Baseline SCr 1.1, near baseline  - Trend Scr  - MIVF   Hx of 3rd Degree block- Pacemaker.  - EKG showing v-paced   HFpEF- 2016 ECHO EF [id number], Grade 1 diastolic dysfunction  - holding lasix   Depression  -Cymbalta   HTN  - holding Losartan    FEN/GI: MIVF @ 125 ml, restart meds once no longer NPO Prophylaxis: hold Lovenox due to platelets of 62  Disposition:  Med-surg  History of Present Illness:  Jordan [name] is a 82 y.o. female presenting with 24 hour hx of dysphagia. Per daughter who translated for her mother, patient was eating chinese food and soup on Sunday night, when she had to stop after a few bites, due to the sensation of food being stuck in her throat. Since then patient has not been able to eat any liquids or solids. She state every time she tries they come right back up and she begins producing a lot of thick saliva. Patient has had dsyphagia in the past, several months ago, however it quickly resolved at that time. She has never had an endoscopy in the past. She has no history of smoking, and only has an occasional drink.  She does have a 1 year hx of reflux, which she states was similar to these episodes of dysphagia as she feels she has thick salvation at times. She does not feel the PPI has helped with this. Per ED, contacted Dr. Amedeo Plenty, GI who plans to do Endoscopy in the AM.   Review Of Systems: Per HPI with the following additions: Review of Systems  Constitutional: Negative for fever and chills.  HENT: Negative for congestion and sore throat.   Respiratory: Negative for cough and shortness of breath.   Cardiovascular: Negative for chest pain and palpitations.  Gastrointestinal: Positive for vomiting. Negative for abdominal pain, diarrhea and blood in stool.  Genitourinary: Negative for dysuria and urgency.  Neurological: Negative for dizziness and focal weakness.    Otherwise the remainder of the systems were negative.  Patient Active Problem List   Diagnosis Date Noted  . Esophageal obstruction 09/15/2015  . Dysphagia 09/15/2015  . S/P shoulder replacement 03/20/2015  . Hyperlipidemia 02/27/2015  . GERD (gastroesophageal reflux disease) 07/25/2014  . Impaired mobility and ADLs 05/30/2014  . Chronic pain syndrome 05/09/2014  . Hearing loss of aging 05/09/2014  . Glaucoma 04/18/2014  . Macular degeneration, age related,  nonexudative 04/18/2014  . Combined visual hearing impairment 11/12/2013  . Gout 09/21/2013  . Persistent headaches 09/06/2013  . 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 CKD (chronic kidney disease) stage 3, GFR 30-59 ml/min 09/03/2013  . Chronic diastolic heart failure ([place]) 09/01/2013  . Constipation 08/23/2013  . Alzheimer's dementia 07/30/2013  . Spinal stenosis of lumbar region 10/03/2012  . Rotator cuff tear arthropathy 09/11/2012  . Gait instability 08/28/2012  . Insomnia 06/14/2010  . PERIPHERAL NEUROPATHY 03/29/2010  . INTERSTITIAL CYSTITIS 09/21/2009  . OSTEOPOROSIS 03/20/2008  . HERNIA, INCISIONAL VENTRAL W/O OBST/GNGR 10/25/2006  . Hypothyroidism 06/29/2006  . Depressive disorder 06/29/2006  . CARPAL TUNNEL SYNDROME 06/29/2006  . HTN (hypertension) 06/29/2006  . CYSTOCELE/RECTOCELE/PROLAPSE,UNSPEC. 06/29/2006  . Urinary incontinence, mixed 06/29/2006  . OSTEOARTHRITIS, MULTI SITES 06/29/2006    Past Medical History: Past Medical History  Diagnosis Date  . Cervicalgia   . Peripheral neuropathy ([place])   . Hypertension   . Osteoporosis   . Insomnia   . Arthritis   . Headache   . Hypothyroidism   . Right rotator cuff tear   . OA (osteoarthritis)     RIGHT SHOULDER AC JOINT  . Moderate dementia   . Gait instability   . Complete heart block ([place])   . History of colon polyps   . Diverticulosis of colon   . SUI (stress urinary incontinence, female)   . Lumbar stenosis   . Cryptogenic stroke ([place]) S/P  LOOP RECORDER IMPLANT    DX  01/2013  --  SYNCOPAL EPISODE --  EPISODIC  ATRIAL TACHY/ FIBULATION  AND PAUSES  . Frequency of urination   . Chronic constipation   . Glaucoma of both eyes   . Hearing loss of both ears     no hearing aids  . Dry eyes   . Cervicalgia 05/18/2009    Qualifier: Diagnosis of  By: Walker Kehr MD, Patrick Jupiter    . DIVERTICULOSIS OF COLON 06/29/2006    Qualifier: Diagnosis of  By: Benna Dunks    .  Combined visual hearing impairment 11/12/2013  . Urinary incontinence, mixed 06/29/2006    Qualifier: Diagnosis of  By: Benna Dunks    . INTERSTITIAL CYSTITIS 09/21/2009    Qualifier: Diagnosis of  By: Sarita Haver  MD, Coralyn Mark    . Syncope and collapse 02/09/2013  . Right knee pain 01/15/2014  . Polymyalgia rheumatica ([place]) 11/08/2013  . Loss of weight 04/05/2010    Qualifier: Diagnosis of  By: Walker Kehr MD, Patrick Jupiter    . Macular degeneration, age related, nonexudative 04/18/2014  . Hearing loss of aging 05/09/2014  . Presence of permanent cardiac pacemaker   . Shortness of breath dyspnea   . Pacemaker   . Constipation   . Weakness 08/29/2013  . Urinary frequency 06/05/2015  . Shortness of breath 07/21/2014  . Persistent headaches 09/06/2013  . Rotator cuff tear arthropathy 09/11/2012    Likely related to rotator cuff  tendinopathy seen on musculoskeletal ultrasound, and GH DJD.  Complete tear of right supra and infraspinatus on MRI 10/2012     . S/P shoulder replacement 03/20/2015  . Spinal stenosis of lumbar region 10/03/2012    Patient has significant low back pain related to spinal stenosis and DJD, DDD.  We had a long discussion about treatment options which are quite limited. She is likely not a good surgical candidate for laminectomy. I am not sure about the usefulness of epidural steroid injection. We'll plan on restarting meloxicam after a long discussion of the pros and cons and risks of chronic kidney disease.  The patient and her family feel that the benefit of good pain control is the small risk.  Additionally she will followup with her primary care provider for creatinine monitoring.  Will obtain physical therapy for TENS unit trial.  Will refer to pain management for consideration of epidural steroid injection as that worked well in the past for cervical pain Followup here as needed   . PERIPHERAL NEUROPATHY 03/29/2010    Qualifier: Diagnosis of  By: Walker Kehr MD, Patrick Jupiter    . Pacemaker-dependent due to native  cardiac rhythm insufficient to support life 09/04/2013  . OSTEOPOROSIS 03/20/2008    Qualifier: Diagnosis of  By: Jerline Pain MD, Anderson Malta    . OSTEOARTHRITIS, MULTI SITES 06/29/2006    Qualifier: Diagnosis of  By: Benna Dunks    . Mobitz type 2 second degree heart block 08/29/2013  . MASS, LUNG 04/05/2010    Qualifier: Diagnosis of  By: Walker Kehr MD, Patrick Jupiter    . Impaired mobility and ADLs 05/30/2014  . Hyperlipidemia 02/27/2015  . HTN (hypertension) 06/29/2006    Isolated Systolic Hypertension - Ambulatory BP  Monitoring result 4/28-29/2015   . HERNIA, INCISIONAL VENTRAL W/O OBST/GNGR 10/25/2006    Has been seen by surgery in the past and told that this is not dangerous and should only be treated particularly bothersome    . Gout 09/21/2013  . Glaucoma 04/18/2014  . Depressive disorder 06/29/2006        . CYSTOCELE/RECTOCELE/PROLAPSE,UNSPEC. 06/29/2006    Qualifier: Diagnosis of  By: Benna Dunks    . CKD (chronic kidney disease) stage 3, GFR 30-59 ml/min 09/03/2013  . Chronic pain syndrome 05/09/2014  . Chronic diastolic heart failure () 09/01/2013  . Carpal tunnel syndrome 06/29/2006    Qualifier: Diagnosis of  By: Benna Dunks    . Alzheimer's dementia 07/30/2013    04/17/14 MoCA - Blind (Spanish version); 9 out of 22.  Correct MoCA = 12 out of 30.      Past Surgical History: Past Surgical History  Procedure Laterality Date  . Loop recorder implant  02-08-13; 08-30-13    MDT LinQ implanted by Dr Lovena Le for syncope, explanted 08-30-13 after CHB identified  . Total abdominal hysterectomy w/ bilateral salpingoophorectomy  03-15-2005  . Carpal tunnel release Right   . Eye surgery      cataract, bilat.  . Pacemaker insertion  08-30-2013    MDT ADDRL1 pacemaker implanted by Dr Rayann Heman for CHB  . Loop recorder implant N/A 02/11/2013    Procedure: LOOP RECORDER IMPLANT;  Surgeon: Evans Lance, MD;  Location: [facility] CATH LAB;  Service: Cardiovascular;  Laterality: N/A;  . Permanent pacemaker insertion N/A 08/30/2013     Procedure: PERMANENT PACEMAKER INSERTION;  Surgeon: Coralyn Mark, MD;  Location: [facility] CATH LAB;  Service: Cardiovascular;  Laterality: N/A;  . Insert / replace / remove pacemaker    . Reverse shoulder  arthroplasty Right 03/20/2015    Procedure: RIGHT REVERSE SHOULDER ARTHROPLASTY;  Surgeon: Netta Cedars, MD;  Location: [facility];  Service: Orthopedics;  Laterality: Right;    Social History: Social History  Substance Use Topics  . Smoking status: Never Smoker   . Smokeless tobacco: None  . Alcohol Use: No   Additional social history: No hx of smoking, moderate EtOH use,  Please also refer to relevant sections of EMR.  Family History: Family History  Problem Relation Age of Onset  . Kidney disease Mother   . Heart attack Neg Hx   . Stroke Neg Hx    Allergies and Medications: Allergies  Allergen Reactions  . Chlorthalidone Other (See Comments)    Hyponatremia  . Honey Bee Treatment [Bee Venom] Swelling and Rash  . Verapamil Other (See Comments)    Bradycardia - HR  35-45 on 40mg  TID  . Adhesive [Tape] Rash and Other (See Comments)    Skin irritation  . Amlodipine Other (See Comments)    Peripheral edema with 2.5mg  daily dose   No current facility-administered medications on file prior to encounter.   Current Outpatient Prescriptions on File Prior to Encounter  Medication Sig Dispense Refill  . acetaminophen (TYLENOL) 325 MG tablet Take 650 mg by mouth every 6 (six) hours as needed for mild pain.     Marland Kitchen allopurinol (ZYLOPRIM) 100 MG tablet Take 1 tablet (100 mg total) by mouth daily. 30 tablet 12  . beta carotene w/minerals (OCUVITE) tablet Take 1 tablet by mouth daily. 90 tablet 3  . Calcium Carbonate-Vitamin D (CALTRATE 600+D) 600-400 MG-UNIT per tablet Take 1 tablet by mouth 2 (two) times daily.      . colchicine 0.6 MG tablet Take 1 tablet (0.6 mg total) by mouth 2 (two) times daily as needed (GOUT). 60 tablet 5  . cycloSPORINE (RESTASIS) 0.05 % ophthalmic emulsion Place 1  drop into both eyes at bedtime.    . diclofenac sodium (VOLTAREN) 1 % GEL apply 2 grams to affected area four times a day if needed for pain 100 g 5  . DULoxetine (CYMBALTA) 60 MG capsule take 1 capsule by mouth once daily 30 capsule 1  . esomeprazole (NEXIUM) 40 MG capsule take 1 capsule by mouth twice a day 60 capsule 1  . fesoterodine (TOVIAZ) 8 MG TB24 tablet Take 1 tablet (8 mg total) by mouth daily. 30 tablet PRN  . furosemide (LASIX) 20 MG tablet Take 1 tablet (20 mg total) by mouth daily. 30 tablet 5  . gabapentin (NEURONTIN) 100 MG capsule take 2 capsules by mouth three times a day 180 capsule 5  . ibuprofen (ADVIL,MOTRIN) 100 MG chewable tablet Chew 100-200 mg by mouth every 8 (eight) hours as needed (for pain.).     Marland Kitchen levothyroxine (SYNTHROID, LEVOTHROID) 100 MCG tablet take 1 tablet by mouth once daily 30 tablet 2  . lidocaine (XYLOCAINE) 5 % ointment apply to affected area once daily if needed ( DO NOT EXCEED 5 GRAMS OF OINTMENT) 30 g 0  . oxyCODONE-acetaminophen (ROXICET) 5-325 MG tablet Take 1-2 tablets by mouth every 4 (four) hours as needed for severe pain. 60 tablet 0  . polyethylene glycol (MIRALAX) packet Take 17 g by mouth daily. (Patient taking differently: Take 17 g by mouth daily as needed for mild constipation. ) 100 each 0  . RA ASPIRIN EC 81 MG EC tablet take 1 tablet by mouth once daily 30 tablet 5  . RA COL-RITE 100 MG capsule take 1  capsule by mouth once daily if needed 10 capsule 2  . sucralfate (CARAFATE) 1 g tablet Take 1 tablet (1 g total) by mouth 4 (four) times daily -  with meals and at bedtime. 120 tablet 2  . TRAVATAN Z 0.004 % SOLN ophthalmic solution Place 1 drop into both eyes at bedtime.     . traZODone (DESYREL) 50 MG tablet take 1 tablet by mouth at bedtime 90 tablet 3  . lidocaine (XYLOCAINE) 5 % ointment apply to affected area once daily if needed ( DO NOT EXCEED 5 GRAMS OF OINTMENT) 30 g 0  . ondansetron (ZOFRAN) 4 MG tablet Take 2 tablets (8 mg  total) by mouth every 8 (eight) hours as needed for nausea or vomiting. 20 tablet 0    Objective: BP 173/66 mmHg  Pulse 89  Temp(Src) 98.3 F (36.8 C) (Oral)  Resp 16  Wt 63 lb 12.8 oz (28.939 kg)  SpO2 92% Exam: General: Lying in bed, NAD  Eyes: PERRL  ENTM: MMM  Neck: No lymphadenopathy or thyromegaly  Cardiovascular: RRR, no murmurs, rubs or gallops Respiratory: CTAB, no wheezes or crackles  Abdomen: BS+, no distention, no ttp  MSK: Moving all extremities, no lower extremity edema  Skin: No rashes  Neuro: alert and orientated, equal strength in upper and lower extremities  Psych: Normal affect   Labs and Imaging: CBC BMET   Recent Labs Lab 09/15/15 0453  WBC 7.9  HGB 11.0*  HCT 35.2*  PLT 62*    Recent Labs Lab 09/15/15 0453  NA 139  K 4.0  CL 101  CO2 28  BUN 23*  CREATININE 1.18*  GLUCOSE 92  CALCIUM 9.0      Troponin 0.01  EKG ventricular paced   Aryaa Bunting Cletis Media, MD 09/15/2015, 7:24 AM PGY-1, [facility] pager: [phone], text pages welcome

## 2015-09-15 NOTE — ED Provider Notes (Signed)
CSN: [id number]     Arrival date & time 09/14/15  1948 History   First MD Initiated Contact with Patient 09/14/15 2256     Chief Complaint  Patient presents with  . Abdominal Pain      HPI Patient presents with difficulty swallowing since yesterday's evening meal.  She was eating some soup.  She feels like something is stuck just above her stomach.  Denies abdominal distention and shortness of breath abdominal pain.  Denies any fever. Past Medical History  Diagnosis Date  . Cervicalgia   . Peripheral neuropathy ([place])   . Hypertension   . Osteoporosis   . Insomnia   . Arthritis   . Headache   . Hypothyroidism   . Right rotator cuff tear   . OA (osteoarthritis)     RIGHT SHOULDER AC JOINT  . Moderate dementia   . Gait instability   . Complete heart block ([place])   . History of colon polyps   . Diverticulosis of colon   . SUI (stress urinary incontinence, female)   . Lumbar stenosis   . Cryptogenic stroke ([place]) S/P  LOOP RECORDER IMPLANT    DX  01/2013  --  SYNCOPAL EPISODE --  EPISODIC  ATRIAL TACHY/ FIBULATION  AND PAUSES  . Frequency of urination   . Chronic constipation   . Glaucoma of both eyes   . Hearing loss of both ears     no hearing aids  . Dry eyes   . Cervicalgia 05/18/2009    Qualifier: Diagnosis of  By: Walker Kehr MD, Patrick Jupiter    . DIVERTICULOSIS OF COLON 06/29/2006    Qualifier: Diagnosis of  By: Benna Dunks    . Combined visual hearing impairment 11/12/2013  . Urinary incontinence, mixed 06/29/2006    Qualifier: Diagnosis of  By: Benna Dunks    . INTERSTITIAL CYSTITIS 09/21/2009    Qualifier: Diagnosis of  By: Sarita Haver  MD, Coralyn Mark    . Syncope and collapse 02/09/2013  . Right knee pain 01/15/2014  . Polymyalgia rheumatica ([place]) 11/08/2013  . Loss of weight 04/05/2010    Qualifier: Diagnosis of  By: Walker Kehr MD, Patrick Jupiter    . Macular degeneration, age related, nonexudative 04/18/2014  . Hearing loss of aging 05/09/2014  . Presence of permanent cardiac pacemaker   . Shortness  of breath dyspnea   . Pacemaker   . Constipation   . Weakness 08/29/2013  . Urinary frequency 06/05/2015  . Shortness of breath 07/21/2014  . Persistent headaches 09/06/2013  . Rotator cuff tear arthropathy 09/11/2012    Likely related to rotator cuff tendinopathy seen on musculoskeletal ultrasound, and GH DJD.  Complete tear of right supra and infraspinatus on MRI 10/2012     . S/P shoulder replacement 03/20/2015  . Spinal stenosis of lumbar region 10/03/2012    Patient has significant low back pain related to spinal stenosis and DJD, DDD.  We had a long discussion about treatment options which are quite limited. She is likely not a good surgical candidate for laminectomy. I am not sure about the usefulness of epidural steroid injection. We'll plan on restarting meloxicam after a long discussion of the pros and cons and risks of chronic kidney disease.  The patient and her family feel that the benefit of good pain control is the small risk.  Additionally she will followup with her primary care provider for creatinine monitoring.  Will obtain physical therapy for TENS unit trial.  Will refer to pain management for consideration of epidural  steroid injection as that worked well in the past for cervical pain Followup here as needed   . PERIPHERAL NEUROPATHY 03/29/2010    Qualifier: Diagnosis of  By: Walker Kehr MD, Patrick Jupiter    . 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 OSTEOPOROSIS 03/20/2008    Qualifier: Diagnosis of  By: Jerline Pain MD, Anderson Malta    . OSTEOARTHRITIS, MULTI SITES 06/29/2006    Qualifier: Diagnosis of  By: Benna Dunks    . Mobitz type 2 second degree heart block 08/29/2013  . MASS, LUNG 04/05/2010    Qualifier: Diagnosis of  By: Walker Kehr MD, Patrick Jupiter    . Impaired mobility and ADLs 05/30/2014  . Hyperlipidemia 02/27/2015  . HTN (hypertension) 06/29/2006    Isolated Systolic Hypertension - Ambulatory BP  Monitoring result 4/28-29/2015   . HERNIA, INCISIONAL  VENTRAL W/O OBST/GNGR 10/25/2006    Has been seen by surgery in the past and told that this is not dangerous and should only be treated particularly bothersome    . Gout 09/21/2013  . Glaucoma 04/18/2014  . Depressive disorder 06/29/2006        . CYSTOCELE/RECTOCELE/PROLAPSE,UNSPEC. 06/29/2006    Qualifier: Diagnosis of  By: Benna Dunks    . CKD (chronic kidney disease) stage 3, GFR 30-59 ml/min 09/03/2013  . Chronic pain syndrome 05/09/2014  . Chronic diastolic heart failure ([place]) 09/01/2013  . Carpal tunnel syndrome 06/29/2006    Qualifier: Diagnosis of  By: Benna Dunks    . Alzheimer's dementia 07/30/2013    04/17/14 MoCA - Blind (Spanish version); 9 out of 22.  Correct MoCA = 12 out of 30.     Past Surgical History  Procedure Laterality Date  . Loop recorder implant  02-08-13; 08-30-13    MDT LinQ implanted by Dr Lovena Le for syncope, explanted 08-30-13 after CHB identified  . Total abdominal hysterectomy w/ bilateral salpingoophorectomy  03-15-2005  . Carpal tunnel release Right   . Eye surgery      cataract, bilat.  . Pacemaker insertion  08-30-2013    MDT ADDRL1 pacemaker implanted by Dr Rayann Heman for CHB  . Loop recorder implant N/A 02/11/2013    Procedure: LOOP RECORDER IMPLANT;  Surgeon: Evans Lance, MD;  Location: [facility] CATH LAB;  Service: Cardiovascular;  Laterality: N/A;  . Permanent pacemaker insertion N/A 08/30/2013    Procedure: PERMANENT PACEMAKER INSERTION;  Surgeon: Coralyn Mark, MD;  Location: [facility] CATH LAB;  Service: Cardiovascular;  Laterality: N/A;  . Insert / replace / remove pacemaker    . Reverse shoulder arthroplasty Right 03/20/2015    Procedure: RIGHT REVERSE SHOULDER ARTHROPLASTY;  Surgeon: Netta Cedars, MD;  Location: [place];  Service: Orthopedics;  Laterality: Right;   Family History  Problem Relation Age of Onset  . Kidney disease Mother   . Heart attack Neg Hx   . Stroke Neg Hx    Social History  Substance Use Topics  . Smoking status: Never Smoker   . Smokeless  tobacco: None  . Alcohol Use: No   OB History    Gravida Para Term Preterm AB TAB SAB Ectopic Multiple Living   0 0 0 0 0 0 0 0       Review of Systems  Unable to perform ROS: Acuity of condition      Allergies  Chlorthalidone; Honey bee treatment; Verapamil; Adhesive; and Amlodipine  Home Medications   Prior to Admission medications   Medication Sig Start Date End Date Taking? Authorizing Provider  acetaminophen (TYLENOL) 325 MG tablet Take 650 mg by mouth every 6 (six) hours as needed for mild pain.    Yes Historical Provider, MD  allopurinol (ZYLOPRIM) 100 MG tablet Take 1 tablet (100 mg total) by mouth daily. 11/17/14  Yes Smiley Houseman, MD  beta carotene w/minerals (OCUVITE) tablet Take 1 tablet by mouth daily. 04/17/14  Yes Coral Spikes, DO  Calcium Carbonate-Vitamin D (CALTRATE 600+D) 600-400 MG-UNIT per tablet Take 1 tablet by mouth 2 (two) times daily.     Yes Historical Provider, MD  colchicine 0.6 MG tablet Take 1 tablet (0.6 mg total) by mouth 2 (two) times daily as needed (GOUT). 07/09/15  Yes Todd D McDiarmid, MD  cycloSPORINE (RESTASIS) 0.05 % ophthalmic emulsion Place 1 drop into both eyes at bedtime.   Yes Historical Provider, MD  diclofenac sodium (VOLTAREN) 1 % GEL apply 2 grams to affected area four times a day if needed for pain 07/09/15  Yes Blane Ohara McDiarmid, MD  DULoxetine (CYMBALTA) 60 MG capsule take 1 capsule by mouth once daily 08/31/15  Yes Lind Covert, MD  esomeprazole (NEXIUM) 40 MG capsule take 1 capsule by mouth twice a day 08/25/15  Yes Blane Ohara McDiarmid, MD  fesoterodine (TOVIAZ) 8 MG TB24 tablet Take 1 tablet (8 mg total) by mouth daily. 06/04/15  Yes Blane Ohara McDiarmid, MD  Fish Oil-Cholecalciferol (FISH OIL + D3 PO) Take 1 capsule by mouth daily.   Yes Historical Provider, MD  furosemide (LASIX) 20 MG tablet Take 1 tablet (20 mg total) by mouth daily. 07/09/15  Yes Blane Ohara McDiarmid, MD  gabapentin (NEURONTIN) 100 MG capsule take 2 capsules by  mouth three times a day 07/09/15  Yes Todd D McDiarmid, MD  ibuprofen (ADVIL,MOTRIN) 100 MG chewable tablet Chew 100-200 mg by mouth every 8 (eight) hours as needed (for pain.).    Yes Historical Provider, MD  levothyroxine (SYNTHROID, LEVOTHROID) 100 MCG tablet take 1 tablet by mouth once daily 06/29/15  Yes Todd D McDiarmid, MD  lidocaine (XYLOCAINE) 5 % ointment apply to affected area once daily if needed ( DO NOT EXCEED 5 GRAMS OF OINTMENT) 07/31/15  Yes Blane Ohara McDiarmid, MD  losartan (COZAAR) 50 MG tablet Take 1 tablet by mouth daily. 08/17/15  Yes Historical Provider, MD  oxyCODONE-acetaminophen (ROXICET) 5-325 MG tablet Take 1-2 tablets by mouth every 4 (four) hours as needed for severe pain. 03/20/15  Yes Netta Cedars, MD  polyethylene glycol [facility]) packet Take 17 g by mouth daily. Patient taking differently: Take 17 g by mouth daily as needed for mild constipation.  07/22/14  Yes Leone Haven, MD  RA ASPIRIN EC 81 MG EC tablet take 1 tablet by mouth once daily 06/01/15  Yes Smiley Houseman, MD  RA COL-RITE 100 MG capsule take 1 capsule by mouth once daily if needed 07/09/15  Yes Blane Ohara McDiarmid, MD  sucralfate (CARAFATE) 1 g tablet Take 1 tablet (1 g total) by mouth 4 (four) times daily -  with meals and at bedtime. 06/09/15  Yes Todd D McDiarmid, MD  TRAVATAN Z 0.004 % SOLN ophthalmic solution Place 1 drop into both eyes at bedtime.  02/20/13  Yes Historical Provider, MD  traZODone (DESYREL) 50 MG tablet take 1 tablet by mouth at bedtime 07/02/15  Yes Todd D McDiarmid, MD  lidocaine (XYLOCAINE) 5 % ointment apply to affected area once daily if needed ( DO NOT EXCEED 5 GRAMS OF OINTMENT) 07/31/15   Blane Ohara McDiarmid,  MD  ondansetron (ZOFRAN) 4 MG tablet Take 2 tablets (8 mg total) by mouth every 8 (eight) hours as needed for nausea or vomiting. 07/09/15   Blane Ohara McDiarmid, MD   BP 143/60 mmHg  Pulse 81  Temp(Src) 98.3 F (36.8 C) (Oral)  Resp 16  Wt 63 lb 12.8 oz (28.939 kg)  SpO2  93% Physical Exam  Constitutional: She is oriented to person, place, and time. She appears well-developed and well-nourished. No distress.  HENT:  Head: Normocephalic and atraumatic.  Patient unable to swallow liquids without regurgitating them.  No airway obstruction  Eyes: Pupils are equal, round, and reactive to light.  Neck: Normal range of motion.  Cardiovascular: Normal rate and intact distal pulses.   Pulmonary/Chest: No respiratory distress.  Abdominal: Normal appearance. She exhibits no distension. There is tenderness. There is guarding. There is no rebound.  Musculoskeletal: Normal range of motion.  Neurological: She is alert and oriented to person, place, and time. No cranial nerve deficit.  Skin: Skin is warm and dry. No rash noted.  Psychiatric: She has a normal mood and affect. Her behavior is normal.  Nursing note and vitals reviewed.   ED Course  Procedures (including critical care time) Labs Review Labs Reviewed  COMPREHENSIVE METABOLIC PANEL - Abnormal; Notable for the following:    Chloride 99 (*)    Glucose, Bld 104 (*)    BUN 24 (*)    Creatinine, Ser 1.20 (*)    GFR calc non Af Amer 37 (*)    GFR calc Af Amer 43 (*)    All other components within normal limits  CBC - Abnormal; Notable for the following:    Platelets 72 (*)    All other components within normal limits  I-STAT TROPOININ, ED    Imaging Review No results found. I have personally reviewed and evaluated these images and lab results as part of my medical decision-making.   EKG Interpretation   Date/Time:  Monday Sep 14 2015 20:41:45 EDT Ventricular Rate:  88 PR Interval:  186 QRS Duration: 102 QT Interval:  378 QTC Calculation: 457 R Axis:   -24 Text Interpretation:  Atrial-sensed ventricular-paced rhythm Abnormal ECG  Confirmed by Owen Pratte  MD, Jhalil Silvera ([id number]) on 09/14/2015 11:16:05 PM     I discussed with GI who recommended patient be hydrated tonight and will see her first thing in  the morning for EGD with possible esophageal dilatation. MDM   Final diagnoses:  Esophageal obstruction        Leonard Schwartz, MD 09/15/15 [phone]

## 2015-09-15 NOTE — Progress Notes (Signed)
Spanish and English discharge papers gone over with pt. And her family. No questions/complaints. IV taken out. Daughter concerned about pt. Having fever. Temp. Checked: 97.54F. Pt.'s daughter states she would like her to eat dinner before leaving.

## 2015-09-15 NOTE — Progress Notes (Signed)
Received patient from ED accompanied by daughter and son.  Daughter and son speaks good English who provided translation of patient who speaks very little [place].  VS stable, denies any pain but only discomfort when swallowing.  Daughter, son and patient oriented to room, bed control and call light.  Daughter left [facility] after patient get situated while son stayed to provide assistance.

## 2015-09-15 NOTE — H&P (Signed)
[facility] Admission History and Physical Service Pager: [phone]  [name] Medical record number: [id number] Date of birth: Mar 03, 1920 Age: 82 y.o. Gender: female  Primary Care Provider: MCDIARMID,TODD D, MD Consultants: GI  Code Status: Full   Chief Complaint: Dysphagia   Assessment and Plan: Jordan [name] is a 82 y.o. female presenting with dysphagia . 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 Patient with recent hx of starting a PPI about 1 year ago, per patient this was due to compliant of increased thickened secretions when swallowing similar in a way to current episode, prior episodes of dysphagia in the past that quickly self-resolved. No recent hx of viral infections make eosinophilic esophagitis less likely. Interesting that this does not seem to be progressive in nature, which would have pointed towards cancer or stricture and relatively acute, however it is still high on my differntial due to patient's age. Though acute in nature, unlikely neurologic a patient is neurologically intact. CBC  Wnl, BMET wnl  - Admit to med-surg observation, Dr. Andria Frames  - AM CBC with diff to check of eosinophils , CMET, and TSH  - MIVF  - IV Zofran  - NPO  - GI consulted in by the ED, Dr. Amedeo Plenty will need an endoscopy in the AM  - GI recs   Hypothyroidism : no goiter noted on exam  - TSH - Holding synthroid for now   CKD III Baseline SCr 1.1, near baseline  - Trend Scr  - MIVF   Hx of 3rd Degree block- Pacemaker.  - EKG showing v-paced   HFpEF- 2016 ECHO EF [id number], Grade 1 diastolic dysfunction  - holding lasix   Depression  -Cymbalta   HTN  - holding Losartan    FEN/GI: MIVF @ 125 ml, restart meds once no longer NPO Prophylaxis: hold Lovenox due to platelets of  62  Disposition: Med-surg  History of Present Illness:  Jordan [name] is a 82 y.o. female presenting with 24 hour hx of dysphagia. Per daughter who translated for her mother, patient was eating chinese food and soup on Sunday night, when she had to stop after a few bites, due to the sensation of food being stuck in her throat. Since then patient has not been able to eat any liquids or solids. She state every time she tries they come right back up and she begins producing a lot of thick saliva. Patient has had dsyphagia in the past, several months ago, however it quickly resolved at that time. She has never had an endoscopy in the past. She has no history of smoking, and only has an occasional drink.  She does have a 1 year hx of reflux, which she states was similar to these episodes of dysphagia as she feels she has thick salvation at times. She does not feel the PPI has helped with this. Per ED, contacted Dr. Amedeo Plenty, GI who plans to do Endoscopy in the AM.   Review Of Systems: Per HPI with the following additions: Review of Systems  Constitutional: Negative for fever and chills.  HENT: Negative for congestion and sore throat.   Respiratory: Negative for cough and shortness of breath.   Cardiovascular: Negative for chest pain and palpitations.  Gastrointestinal: Positive for vomiting. Negative for abdominal pain, diarrhea and blood in  stool.  Genitourinary: Negative for dysuria and urgency.  Neurological: Negative for dizziness and focal weakness.    Otherwise the remainder of the systems were negative.  Patient Active Problem List   Diagnosis Date Noted  . S/P shoulder replacement 03/20/2015  . Hyperlipidemia 02/27/2015  . GERD (gastroesophageal reflux disease) 07/25/2014  . Impaired mobility and ADLs 05/30/2014  . Chronic pain syndrome 05/09/2014  . Hearing loss of aging 05/09/2014  . Glaucoma 04/18/2014  . Macular degeneration, age related, nonexudative 04/18/2014  . Combined visual  hearing impairment 11/12/2013  . Gout 09/21/2013  . Persistent headaches 09/06/2013  . 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 CKD (chronic kidney disease) stage 3, GFR 30-59 ml/min 09/03/2013  . Chronic diastolic heart failure ([place]) 09/01/2013  . Constipation 08/23/2013  . Alzheimer's dementia 07/30/2013  . Spinal stenosis of lumbar region 10/03/2012  . Rotator cuff tear arthropathy 09/11/2012  . Gait instability 08/28/2012  . Insomnia 06/14/2010  . PERIPHERAL NEUROPATHY 03/29/2010  . INTERSTITIAL CYSTITIS 09/21/2009  . OSTEOPOROSIS 03/20/2008  . HERNIA, INCISIONAL VENTRAL W/O OBST/GNGR 10/25/2006  . Hypothyroidism 06/29/2006  . Depressive disorder 06/29/2006  . CARPAL TUNNEL SYNDROME 06/29/2006  . HTN (hypertension) 06/29/2006  . CYSTOCELE/RECTOCELE/PROLAPSE,UNSPEC. 06/29/2006  . Urinary incontinence, mixed 06/29/2006  . OSTEOARTHRITIS, MULTI SITES 06/29/2006    Past Medical History: Past Medical History  Diagnosis Date  . Cervicalgia   . Peripheral neuropathy ([place])   . Hypertension   . Osteoporosis   . Insomnia   . Arthritis   . Headache   . Hypothyroidism   . Right rotator cuff tear   . OA (osteoarthritis)     RIGHT SHOULDER AC JOINT  . Moderate dementia   . Gait instability   . Complete heart block ([place])   . History of colon polyps   . Diverticulosis of colon   . SUI (stress urinary incontinence, female)   . Lumbar stenosis   . Cryptogenic stroke ([place]) S/P  LOOP RECORDER IMPLANT    DX  01/2013  --  SYNCOPAL EPISODE --  EPISODIC  ATRIAL TACHY/ FIBULATION  AND PAUSES  . Frequency of urination   . Chronic constipation   . Glaucoma of both eyes   . Hearing loss of both ears     no hearing aids  . Dry eyes   . Cervicalgia 05/18/2009    Qualifier: Diagnosis of  By: Walker Kehr MD, Patrick Jupiter    . DIVERTICULOSIS OF COLON 06/29/2006    Qualifier: Diagnosis of  By: Benna Dunks    . Combined visual hearing impairment  11/12/2013  . Urinary incontinence, mixed 06/29/2006    Qualifier: Diagnosis of  By: Benna Dunks    . INTERSTITIAL CYSTITIS 09/21/2009    Qualifier: Diagnosis of  By: Sarita Haver  MD, Coralyn Mark    . Syncope and collapse 02/09/2013  . Right knee pain 01/15/2014  . Polymyalgia rheumatica ([place]) 11/08/2013  . Loss of weight 04/05/2010    Qualifier: Diagnosis of  By: Walker Kehr MD, Patrick Jupiter    . Macular degeneration, age related, nonexudative 04/18/2014  . Hearing loss of aging 05/09/2014  . Presence of permanent cardiac pacemaker   . Shortness of breath dyspnea   . Pacemaker   . Constipation   . Weakness 08/29/2013  . Urinary frequency 06/05/2015  . Shortness of breath 07/21/2014  . Persistent headaches 09/06/2013  . Rotator cuff tear arthropathy 09/11/2012    Likely related to rotator cuff tendinopathy seen on musculoskeletal ultrasound, and GH  DJD.  Complete tear of right supra and infraspinatus on MRI 10/2012     . S/P shoulder replacement 03/20/2015  . Spinal stenosis of lumbar region 10/03/2012    Patient has significant low back pain related to spinal stenosis and DJD, DDD.  We had a long discussion about treatment options which are quite limited. She is likely not a good surgical candidate for laminectomy. I am not sure about the usefulness of epidural steroid injection. We'll plan on restarting meloxicam after a long discussion of the pros and cons and risks of chronic kidney disease.  The patient and her family feel that the benefit of good pain control is the small risk.  Additionally she will followup with her primary care provider for creatinine monitoring.  Will obtain physical therapy for TENS unit trial.  Will refer to pain management for consideration of epidural steroid injection as that worked well in the past for cervical pain Followup here as needed   . PERIPHERAL NEUROPATHY 03/29/2010    Qualifier: Diagnosis of  By: Walker Kehr MD, Patrick Jupiter    . Pacemaker-dependent due to native cardiac rhythm insufficient to  support life 09/04/2013  . OSTEOPOROSIS 03/20/2008    Qualifier: Diagnosis of  By: Jerline Pain MD, Anderson Malta    . OSTEOARTHRITIS, MULTI SITES 06/29/2006    Qualifier: Diagnosis of  By: Benna Dunks    . Mobitz type 2 second degree heart block 08/29/2013  . MASS, LUNG 04/05/2010    Qualifier: Diagnosis of  By: Walker Kehr MD, Patrick Jupiter    . Impaired mobility and ADLs 05/30/2014  . Hyperlipidemia 02/27/2015  . HTN (hypertension) 06/29/2006    Isolated Systolic Hypertension - Ambulatory BP  Monitoring result 4/28-29/2015   . HERNIA, INCISIONAL VENTRAL W/O OBST/GNGR 10/25/2006    Has been seen by surgery in the past and told that this is not dangerous and should only be treated particularly bothersome    . Gout 09/21/2013  . Glaucoma 04/18/2014  . Depressive disorder 06/29/2006        . CYSTOCELE/RECTOCELE/PROLAPSE,UNSPEC. 06/29/2006    Qualifier: Diagnosis of  By: Benna Dunks    . CKD (chronic kidney disease) stage 3, GFR 30-59 ml/min 09/03/2013  . Chronic pain syndrome 05/09/2014  . Chronic diastolic heart failure ([place]) 09/01/2013  . Carpal tunnel syndrome 06/29/2006    Qualifier: Diagnosis of  By: Benna Dunks    . Alzheimer's dementia 07/30/2013    04/17/14 MoCA - Blind (Spanish version); 9 out of 22.  Correct MoCA = 12 out of 30.      Past Surgical History: Past Surgical History  Procedure Laterality Date  . Loop recorder implant  02-08-13; 08-30-13    MDT LinQ implanted by Dr Lovena Le for syncope, explanted 08-30-13 after CHB identified  . Total abdominal hysterectomy w/ bilateral salpingoophorectomy  03-15-2005  . Carpal tunnel release Right   . Eye surgery      cataract, bilat.  . Pacemaker insertion  08-30-2013    MDT ADDRL1 pacemaker implanted by Dr Rayann Heman for CHB  . Loop recorder implant N/A 02/11/2013    Procedure: LOOP RECORDER IMPLANT;  Surgeon: Evans Lance, MD;  Location[facility];  Service: Cardiovascular;  Laterality: N/A;  . Permanent pacemaker insertion N/A 08/30/2013    Procedure: PERMANENT  PACEMAKER INSERTION;  Surgeon: Coralyn Mark, MD;  Location: [facility] CATH LAB;  Service: Cardiovascular;  Laterality: N/A;  . Insert / replace / remove pacemaker    . Reverse shoulder arthroplasty Right 03/20/2015    Procedure:  RIGHT REVERSE SHOULDER ARTHROPLASTY;  Surgeon: Netta Cedars, MD;  Location: [facility];  Service: Orthopedics;  Laterality: Right;    Social History: Social History  Substance Use Topics  . Smoking status: Never Smoker   . Smokeless tobacco: None  . Alcohol Use: No   Additional social history: No hx of smoking, moderate EtOH use,  Please also refer to relevant sections of EMR.  Family History: Family History  Problem Relation Age of Onset  . Kidney disease Mother   . Heart attack Neg Hx   . Stroke Neg Hx    Allergies and Medications: Allergies  Allergen Reactions  . Chlorthalidone Other (See Comments)    Hyponatremia  . Honey Bee Treatment [Bee Venom] Swelling and Rash  . Verapamil Other (See Comments)    Bradycardia - HR  35-45 on 40mg  TID  . Adhesive [Tape] Rash and Other (See Comments)    Skin irritation  . Amlodipine Other (See Comments)    Peripheral edema with 2.5mg  daily dose   No current facility-administered medications on file prior to encounter.   Current Outpatient Prescriptions on File Prior to Encounter  Medication Sig Dispense Refill  . acetaminophen (TYLENOL) 325 MG tablet Take 650 mg by mouth every 6 (six) hours as needed for mild pain.     Marland Kitchen allopurinol (ZYLOPRIM) 100 MG tablet Take 1 tablet (100 mg total) by mouth daily. 30 tablet 12  . beta carotene w/minerals (OCUVITE) tablet Take 1 tablet by mouth daily. 90 tablet 3  . Calcium Carbonate-Vitamin D (CALTRATE 600+D) 600-400 MG-UNIT per tablet Take 1 tablet by mouth 2 (two) times daily.      . colchicine 0.6 MG tablet Take 1 tablet (0.6 mg total) by mouth 2 (two) times daily as needed (GOUT). 60 tablet 5  . cycloSPORINE (RESTASIS) 0.05 % ophthalmic emulsion Place 1 drop into both eyes at  bedtime.    . diclofenac sodium (VOLTAREN) 1 % GEL apply 2 grams to affected area four times a day if needed for pain 100 g 5  . DULoxetine (CYMBALTA) 60 MG capsule take 1 capsule by mouth once daily 30 capsule 1  . esomeprazole (NEXIUM) 40 MG capsule take 1 capsule by mouth twice a day 60 capsule 1  . fesoterodine (TOVIAZ) 8 MG TB24 tablet Take 1 tablet (8 mg total) by mouth daily. 30 tablet PRN  . furosemide (LASIX) 20 MG tablet Take 1 tablet (20 mg total) by mouth daily. 30 tablet 5  . gabapentin (NEURONTIN) 100 MG capsule take 2 capsules by mouth three times a day 180 capsule 5  . ibuprofen (ADVIL,MOTRIN) 100 MG chewable tablet Chew 100-200 mg by mouth every 8 (eight) hours as needed (for pain.).     Marland Kitchen levothyroxine (SYNTHROID, LEVOTHROID) 100 MCG tablet take 1 tablet by mouth once daily 30 tablet 2  . lidocaine (XYLOCAINE) 5 % ointment apply to affected area once daily if needed ( DO NOT EXCEED 5 GRAMS OF OINTMENT) 30 g 0  . oxyCODONE-acetaminophen (ROXICET) 5-325 MG tablet Take 1-2 tablets by mouth every 4 (four) hours as needed for severe pain. 60 tablet 0  . polyethylene glycol (MIRALAX) packet Take 17 g by mouth daily. (Patient taking differently: Take 17 g by mouth daily as needed for mild constipation. ) 100 each 0  . RA ASPIRIN EC 81 MG EC tablet take 1 tablet by mouth once daily 30 tablet 5  . RA COL-RITE 100 MG capsule take 1 capsule by mouth once daily if needed  10 capsule 2  . sucralfate (CARAFATE) 1 g tablet Take 1 tablet (1 g total) by mouth 4 (four) times daily -  with meals and at bedtime. 120 tablet 2  . TRAVATAN Z 0.004 % SOLN ophthalmic solution Place 1 drop into both eyes at bedtime.     . traZODone (DESYREL) 50 MG tablet take 1 tablet by mouth at bedtime 90 tablet 3  . lidocaine (XYLOCAINE) 5 % ointment apply to affected area once daily if needed ( DO NOT EXCEED 5 GRAMS OF OINTMENT) 30 g 0  . ondansetron (ZOFRAN) 4 MG tablet Take 2 tablets (8 mg total) by mouth every 8  (eight) hours as needed for nausea or vomiting. 20 tablet 0    Objective: BP 143/60 mmHg  Pulse 81  Temp(Src) 98.3 F (36.8 C) (Oral)  Resp 16  Wt 63 lb 12.8 oz (28.939 kg)  SpO2 93% Exam: General: Lying in bed, NAD  Eyes: PERRL  ENTM: MMM  Neck: No lymphadenopathy or thyromegaly  Cardiovascular: RRR, no murmurs, rubs or gallops Respiratory: CTAB, no wheezes or crackles  Abdomen: BS+, no distention, no ttp  MSK: Moving all extremities, no lower extremity edema  Skin: No rashes  Neuro: alert and orientated, equal strength in upper and lower extremities  Psych: Normal affect   Labs and Imaging: CBC BMET   Recent Labs Lab 09/14/15 2119  WBC 9.8  HGB 13.0  HCT 40.2  PLT 72*    Recent Labs Lab 09/14/15 2119  NA 138  K 4.2  CL 99*  CO2 31  BUN 24*  CREATININE 1.20*  GLUCOSE 104*  CALCIUM 9.6      Troponin 0.01  EKG ventricular paced   Awanda Wilcock Cletis Media, MD 09/15/2015, 12:02 AM PGY-1, [facility] pager: [phone], text pages welcome

## 2015-09-15 NOTE — Op Note (Signed)
[facility] [name] Procedure Date : 09/15/2015 MRN: [id number] Attending MD: Clarene Essex , MD Date of Birth: [birth year] CSN: [id number] Age: 82 Admit Type: Inpatient Procedure:                Upper GI endoscopy Indications:              Foreign body in the esophagus Providers:                Clarene Essex, MD, Cleda Daub, RN, Alfonso Patten,                            Technician Referring MD:              Medicines:                Fentanyl 50 micrograms IV, Midazolam 4 mg IV,                            Cetacaine spray Complications:            No immediate complications. Estimated Blood Loss:     Estimated blood loss: none. Procedure:                Pre-Anesthesia Assessment:                           - Prior to the procedure, a History and Physical                            was performed, and patient medications and                            allergies were reviewed. The patient's tolerance of                            previous anesthesia was also reviewed. The risks                            and benefits of the procedure and the sedation                            options and risks were discussed with the patient.                            All questions were answered, and informed consent                            was obtained. Prior Anticoagulants: The patient has                            taken aspirin, last dose was 2 days prior to                            procedure. ASA Grade Assessment: II - A patient  with mild systemic disease. After reviewing the                            risks and benefits, the patient was deemed in                            satisfactory condition to undergo the procedure.                           After obtaining informed consent, the endoscope was                            passed under direct vision. Throughout the                            procedure, the patient's blood pressure, pulse, and                             oxygen saturations were monitored continuously. The                            [id number]) scope was introduced through the                            mouth, and advanced to the second part of duodenum.                            The upper GI endoscopy was accomplished without                            difficulty. The patient tolerated the procedure                            well. Scope In: Scope Out: Findings:      The larynx was normal.      Food was found in the middle third of the esophagus and in the lower       third of the esophagus. Removal was accomplished with a Pushing into       stomach raising head of bed and washing remainder into stomach.      Localized mild inflammation characterized by erythema was found in the       gastric antrum.      The duodenal bulb, first portion of the duodenum and second portion of       the duodenum were normal.      One tiny esophageal proximal polyp was found. Biopsies were taken with a       cold forceps for histology.      The exam was otherwise without abnormality. Impression:               - Normal larynx.                           - Food was found in the esophagus. Removal was  successful.                           - Chronic gastritis.                           - Normal duodenal bulb, first portion of the                            duodenum and second portion of the duodenum.                           - Esophageal tiny proximal polyp(s) were found.                            Biopsied.                           - The examination was otherwise normal. Moderate Sedation:      Moderate (conscious) sedation was administered by the endoscopy nurse       and supervised by the endoscopist. The following parameters were       monitored: oxygen saturation, heart rate, blood pressure, respiratory       rate, EKG, adequacy of pulmonary ventilation, and response to  care. Recommendation:           - Return patient to hospital ward for ongoing care.                            discuss chewing food well antireflux measures and                            plenty of liquids with patient and family                           - Clear liquid diet for 4 hours.                           - Continue present medications.                           - Await pathology results.                           - Return to GI clinic PRN.                           - Telephone GI clinic for pathology results in 1                            week.                           - Telephone GI clinic if symptomatic PRN. Procedure Code(s):        --- Professional ---  [phone], Esophagogastroduodenoscopy, flexible,                            transoral; with removal of foreign body(s)                           [id number], Esophagogastroduodenoscopy, flexible,                            transoral; with biopsy, single or multiple Diagnosis Code(s):        --- Professional ---                           [id number], Food in esophagus causing other injury,                            initial encounter                           K29.50, Unspecified chronic gastritis without                            bleeding                           K22.8, Other specified diseases of esophagus                           T18.108A, Unspecified foreign body in esophagus                            causing other injury, initial encounter CPT copyright 2016 American Medical Association. All rights reserved. The codes documented in this report are preliminary and upon coder review may  be revised to meet current compliance requirements. Clarene Essex, MD Clarene Essex, MD 09/15/2015 8:51:48 AM This report has been signed electronically. Number of Addenda: 0

## 2015-09-15 NOTE — Discharge Instructions (Signed)
You were admitted for trouble swallowing. Endoscopy was noted to have food stuck in throat and small esophogeal polyp which GI will biopsy. You tolerated diet following endoscopy, and per GI they state that you should follow up as needed if this were to happen again, otherwise no follow.  You were also seen by speech therapy, who evaluated how you swallow.    They recommend: Thin liquid;Dysphagia 3 (Mech soft)  Liquid Administration via: Cup;Straw  Medication Administration: Whole meds with liquid  Supervision: Patient able to self feed  Compensations: Slow rate;Small sips/bites;Follow solids with liquid  Postural Changes: Seated upright at 90 degrees;Remain upright for at least 30 minutes after eating/ drinking  I have set up a follow up appoint on May 22nd at [facility] family medicine.

## 2015-09-16 ENCOUNTER — Encounter (HOSPITAL_COMMUNITY): Payer: Self-pay | Admitting: Gastroenterology

## 2015-09-16 ENCOUNTER — Telehealth: Payer: Self-pay | Admitting: Family Medicine

## 2015-09-16 NOTE — Telephone Encounter (Signed)
Jordan [name], with [facility] calls requesting verbal orders for PT re-cert for 1x this week. Please call her back at [phone]

## 2015-09-16 NOTE — Discharge Summary (Signed)
[facility] Discharge Summary  [name] Medical record number: [id number] Date of birth: [birth year] Age: 82 y.o. Gender: female Date of Admission: 09/14/2015  Date of Discharge: 09/15/15 Admitting Physician: Zenia Resides, MD  Primary Care Provider: MCDIARMID,TODD D, MD Consultants: GI   Indication for Hospitalization: Dysphagia   Discharge Diagnoses/Problem List:  Patient Active Problem List   Diagnosis Date Noted  . Esophageal obstruction 09/15/2015  . Dysphagia 09/15/2015  . Food impaction of esophagus   . Esophageal dysmotility   . S/P shoulder replacement 03/20/2015  . Hyperlipidemia 02/27/2015  . GERD (gastroesophageal reflux disease) 07/25/2014  . Impaired mobility and ADLs 05/30/2014  . Chronic pain syndrome 05/09/2014  . Hearing loss of aging 05/09/2014  . Glaucoma 04/18/2014  . Macular degeneration, age related, nonexudative 04/18/2014  . Combined visual hearing impairment 11/12/2013  . Gout 09/21/2013  . Persistent headaches 09/06/2013  . 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 CKD (chronic kidney disease) stage 3, GFR 30-59 ml/min 09/03/2013  . Chronic diastolic heart failure ([place]) 09/01/2013  . Constipation 08/23/2013  . Alzheimer's dementia 07/30/2013  . Spinal stenosis of lumbar region 10/03/2012  . Rotator cuff tear arthropathy 09/11/2012  . Gait instability 08/28/2012  . Insomnia 06/14/2010  . PERIPHERAL NEUROPATHY 03/29/2010  . INTERSTITIAL CYSTITIS 09/21/2009  . OSTEOPOROSIS 03/20/2008  . HERNIA, INCISIONAL VENTRAL W/O OBST/GNGR 10/25/2006  . Hypothyroidism 06/29/2006  . Depressive disorder 06/29/2006  . CARPAL TUNNEL SYNDROME 06/29/2006  . HTN (hypertension) 06/29/2006  . CYSTOCELE/RECTOCELE/PROLAPSE,UNSPEC. 06/29/2006  . Urinary incontinence, mixed 06/29/2006  . OSTEOARTHRITIS, MULTI SITES 06/29/2006    Disposition: Home   Discharge Condition:  Stable   Discharge Exam:  General: Lying in bed, NAD  Cardiovascular: RRR, no murmurs, rubs or gallops Respiratory: CTAB, no wheezes or crackles  Abdomen: BS+, no distention, no ttp  MSK: Moving all extremities, no lower extremity edema    Brief Hospital Course: Jordan [name] is a 82 y.o. female presenting with 24 hour hx of dysphagia. Per daughter who translated for her mother, patient was eating chinese food and soup on Sunday night, when she had to stop after a few bites, due to the sensation of food being stuck in her throat. Since then patient has not been able to eat any liquids or solids. She states every time she tries the food comes right back up and she begins producing a lot of thick saliva. Patient has had dsyphagia in the past, several months ago, however it quickly resolved at that time. She has never had an endoscopy in the past. She has no history of smoking, and only has an occasional drink.She does have a 1 year hx of reflux, which she states was similar to these episodes of dysphagia as she feels she has thick salivation at times. Patient was fluid rehydrated and GI was consulted. GI performed an endoscopy, food was found in the middle third of the esophagus and in the lower third of the esophagus. Patient was noted to have localized mild inflammation characterized by erythema found in the gastric antrum, and one tiny polyp in esphogaus which was biopsied. Food was removed for esophagus and patient was transitioned to a clear liquid diet. Speech was consulted and recommended dysphagia 3 diet. Patient tolerated meals without any issue and was discharged later that afternoon. Per GI, patient needed no further follow up, unless symptoms returned.   Issues for Follow Up:  1. Ensure patient is  complaint with with dysphagia 3 diet as instructed by speech  2. Please recheck BMET and CBC  Speech Recommendations   SLP recs: Thin liquid;Dysphagia 3 (Mech soft)  Liquid Administration  via: Cup;Straw  Medication Administration: Whole meds with liquid  Supervision: Patient able to self feed  Compensations: Slow rate;Small sips/bites;Follow solids with liquid  Postural Changes: Seated upright at 90 degrees;Remain upright for at least 30 minutes after po   Significant Procedures:  Upper Endoscopy: Findings  The larynx was normal.Food was found in the middle third of the esophagus and in the lower third of the esophagus. Removal was accomplished with a Pushing into stomach raising head of bed and washing remainder into stomach. Localized mild inflammation characterized by erythema was found in the gastric antrum.The duodenal bulb, first portion of the duodenum and second portion of the duodenum were normal.One tiny esophageal proximal polyp was found. Biopsies were taken with a cold forceps for histology.The exam was otherwise without abnormality.  Significant Labs and Imaging:   Recent Labs Lab 09/14/15 2119 09/15/15 0453  WBC 9.8 7.9  HGB 13.0 11.0*  HCT 40.2 35.2*  PLT 72* 62*    Recent Labs Lab 09/14/15 2119 09/15/15 0453  NA 138 139  K 4.2 4.0  CL 99* 101  CO2 31 28  GLUCOSE 104* 92  BUN 24* 23*  CREATININE 1.20* 1.18*  CALCIUM 9.6 9.0  ALKPHOS 97 77  AST 23 20  ALT 14 13*  ALBUMIN 3.5 2.8*   Results/Tests Pending at Time of Discharge: None   Discharge Medications:    Medication List    TAKE these medications        acetaminophen 325 MG tablet  Commonly known as:  TYLENOL  Take 650 mg by mouth every 6 (six) hours as needed for mild pain.     allopurinol 100 MG tablet  Commonly known as:  ZYLOPRIM  Take 1 tablet (100 mg total) by mouth daily.     beta carotene w/minerals tablet  Take 1 tablet by mouth daily.     CALTRATE 600+D 600-400 MG-UNIT tablet  Generic drug:  Calcium Carbonate-Vitamin D  Take 1 tablet by mouth 2 (two) times daily.     colchicine 0.6 MG tablet  Take 1 tablet (0.6 mg total) by mouth 2 (two) times daily as  needed (GOUT).     cycloSPORINE 0.05 % ophthalmic emulsion  Commonly known as:  RESTASIS  Place 1 drop into both eyes at bedtime.     diclofenac sodium 1 % Gel  Commonly known as:  VOLTAREN  apply 2 grams to affected area four times a day if needed for pain     DULoxetine 60 MG capsule  Commonly known as:  CYMBALTA  take 1 capsule by mouth once daily     esomeprazole 40 MG capsule  Commonly known as:  NEXIUM  take 1 capsule by mouth twice a day     fesoterodine 8 MG Tb24 tablet  Commonly known as:  TOVIAZ  Take 1 tablet (8 mg total) by mouth daily.     FISH OIL + D3 PO  Take 1 capsule by mouth daily.     furosemide 20 MG tablet  Commonly known as:  LASIX  Take 1 tablet (20 mg total) by mouth daily.     gabapentin 100 MG capsule  Commonly known as:  NEURONTIN  take 2 capsules by mouth three times a day     ibuprofen 100 MG chewable tablet  Commonly  known as:  ADVIL,MOTRIN  Chew 100-200 mg by mouth every 8 (eight) hours as needed (for pain.).     levothyroxine 100 MCG tablet  Commonly known as:  SYNTHROID, LEVOTHROID  take 1 tablet by mouth once daily     lidocaine 5 % ointment  Commonly known as:  XYLOCAINE  apply to affected area once daily if needed ( DO NOT EXCEED 5 GRAMS OF OINTMENT)     lidocaine 5 % ointment  Commonly known as:  XYLOCAINE  apply to affected area once daily if needed ( DO NOT EXCEED 5 GRAMS OF OINTMENT)     losartan 50 MG tablet  Commonly known as:  COZAAR  Take 1 tablet by mouth daily.     ondansetron 4 MG tablet  Commonly known as:  ZOFRAN  Take 2 tablets (8 mg total) by mouth every 8 (eight) hours as needed for nausea or vomiting.     oxyCODONE-acetaminophen 5-325 MG tablet  Commonly known as:  ROXICET  Take 1-2 tablets by mouth every 4 (four) hours as needed for severe pain.     polyethylene glycol packet  Commonly known as:  MIRALAX  Take 17 g by mouth daily.     RA ASPIRIN EC 81 MG EC tablet  Generic drug:  aspirin  take 1  tablet by mouth once daily     RA COL-RITE 100 MG capsule  Generic drug:  docusate sodium  take 1 capsule by mouth once daily if needed     sucralfate 1 g tablet  Commonly known as:  CARAFATE  Take 1 tablet (1 g total) by mouth 4 (four) times daily -  with meals and at bedtime.     TRAVATAN Z 0.004 % Soln ophthalmic solution  Generic drug:  Travoprost (BAK Free)  Place 1 drop into both eyes at bedtime.     traZODone 50 MG tablet  Commonly known as:  DESYREL  take 1 tablet by mouth at bedtime        Discharge Instructions: Please refer to Patient Instructions section of EMR for full details.  Patient was counseled important signs and symptoms that should prompt return to medical care, changes in medications, dietary instructions, activity restrictions, and follow up appointments.   Follow-Up Appointments: Follow-up Information    Follow up with Phill Myron, MD. Go on 09/21/2015.   Specialty:  Family Medicine   Why:  Hospital follow-up @ 4:15   Contact information:   [place] [id number] [phone]       Tonette Bihari, MD 09/16/2015, 4:14 PM PGY-1, [place]

## 2015-09-17 DIAGNOSIS — M4806 Spinal stenosis, lumbar region: Secondary | ICD-10-CM | POA: Diagnosis not present

## 2015-09-17 DIAGNOSIS — I130 Hypertensive heart and chronic kidney disease with heart failure and stage 1 through stage 4 chronic kidney disease, or unspecified chronic kidney disease: Secondary | ICD-10-CM | POA: Diagnosis not present

## 2015-09-17 DIAGNOSIS — I5032 Chronic diastolic (congestive) heart failure: Secondary | ICD-10-CM | POA: Diagnosis not present

## 2015-09-17 DIAGNOSIS — F0280 Dementia in other diseases classified elsewhere without behavioral disturbance: Secondary | ICD-10-CM | POA: Diagnosis not present

## 2015-09-17 DIAGNOSIS — G309 Alzheimer's disease, unspecified: Secondary | ICD-10-CM | POA: Diagnosis not present

## 2015-09-17 DIAGNOSIS — Z950 Presence of cardiac pacemaker: Secondary | ICD-10-CM | POA: Diagnosis not present

## 2015-09-17 DIAGNOSIS — N183 Chronic kidney disease, stage 3 (moderate): Secondary | ICD-10-CM | POA: Diagnosis not present

## 2015-09-17 NOTE — Telephone Encounter (Signed)
Will forward to MD for verbal orders. [name],CMA

## 2015-09-18 NOTE — Progress Notes (Signed)
   09/15/15 1300  SLP G-Codes **NOT FOR INPATIENT CLASS**  Functional Assessment Tool Used (clinical judgement)  Functional Limitations Swallowing  Swallow Current Status [id number]) [facility]  Swallow Goal Status [id number]) [facility]  Swallow Discharge Status [id number]) [place]  SLP Evaluations  $ SLP Speech Visit 1 Procedure  SLP Evaluations  $BSS Swallow 1 Procedure

## 2015-09-19 DIAGNOSIS — I130 Hypertensive heart and chronic kidney disease with heart failure and stage 1 through stage 4 chronic kidney disease, or unspecified chronic kidney disease: Secondary | ICD-10-CM | POA: Diagnosis not present

## 2015-09-19 DIAGNOSIS — M150 Primary generalized (osteo)arthritis: Secondary | ICD-10-CM | POA: Diagnosis not present

## 2015-09-19 DIAGNOSIS — M4806 Spinal stenosis, lumbar region: Secondary | ICD-10-CM | POA: Diagnosis not present

## 2015-09-19 DIAGNOSIS — M6281 Muscle weakness (generalized): Secondary | ICD-10-CM | POA: Diagnosis not present

## 2015-09-19 DIAGNOSIS — G894 Chronic pain syndrome: Secondary | ICD-10-CM | POA: Diagnosis not present

## 2015-09-19 DIAGNOSIS — I5032 Chronic diastolic (congestive) heart failure: Secondary | ICD-10-CM | POA: Diagnosis not present

## 2015-09-21 ENCOUNTER — Emergency Department (HOSPITAL_COMMUNITY)
Admission: EM | Admit: 2015-09-21 | Discharge: 2015-09-22 | Disposition: A | Discharge status: Home / Self Care | Payer: Medicare Other | Attending: Emergency Medicine | Admitting: Emergency Medicine

## 2015-09-21 ENCOUNTER — Encounter (HOSPITAL_COMMUNITY): Payer: Self-pay | Admitting: Emergency Medicine

## 2015-09-21 ENCOUNTER — Emergency Department (HOSPITAL_COMMUNITY): Payer: Medicare Other

## 2015-09-21 ENCOUNTER — Ambulatory Visit (INDEPENDENT_AMBULATORY_CARE_PROVIDER_SITE_OTHER): Payer: Medicare Other | Admitting: Family Medicine

## 2015-09-21 VITALS — BP 145/43 | HR 70 | Temp 98.7°F | Ht <= 58 in | Wt 140.0 lb

## 2015-09-21 DIAGNOSIS — G4700 Insomnia, unspecified: Secondary | ICD-10-CM | POA: Diagnosis not present

## 2015-09-21 DIAGNOSIS — Y92039 Unspecified place in apartment as the place of occurrence of the external cause: Secondary | ICD-10-CM | POA: Diagnosis not present

## 2015-09-21 DIAGNOSIS — I5032 Chronic diastolic (congestive) heart failure: Secondary | ICD-10-CM | POA: Diagnosis not present

## 2015-09-21 DIAGNOSIS — D696 Thrombocytopenia, unspecified: Secondary | ICD-10-CM

## 2015-09-21 DIAGNOSIS — H9193 Unspecified hearing loss, bilateral: Secondary | ICD-10-CM | POA: Insufficient documentation

## 2015-09-21 DIAGNOSIS — G894 Chronic pain syndrome: Secondary | ICD-10-CM | POA: Diagnosis not present

## 2015-09-21 DIAGNOSIS — R1310 Dysphagia, unspecified: Secondary | ICD-10-CM

## 2015-09-21 DIAGNOSIS — H409 Unspecified glaucoma: Secondary | ICD-10-CM | POA: Insufficient documentation

## 2015-09-21 DIAGNOSIS — W1839XA Other fall on same level, initial encounter: Secondary | ICD-10-CM | POA: Diagnosis not present

## 2015-09-21 DIAGNOSIS — Z79899 Other long term (current) drug therapy: Secondary | ICD-10-CM | POA: Insufficient documentation

## 2015-09-21 DIAGNOSIS — M1990 Unspecified osteoarthritis, unspecified site: Secondary | ICD-10-CM | POA: Insufficient documentation

## 2015-09-21 DIAGNOSIS — I129 Hypertensive chronic kidney disease with stage 1 through stage 4 chronic kidney disease, or unspecified chronic kidney disease: Secondary | ICD-10-CM | POA: Diagnosis not present

## 2015-09-21 DIAGNOSIS — R0781 Pleurodynia: Secondary | ICD-10-CM | POA: Diagnosis not present

## 2015-09-21 DIAGNOSIS — Z950 Presence of cardiac pacemaker: Secondary | ICD-10-CM | POA: Diagnosis not present

## 2015-09-21 DIAGNOSIS — I10 Essential (primary) hypertension: Secondary | ICD-10-CM | POA: Diagnosis not present

## 2015-09-21 DIAGNOSIS — G629 Polyneuropathy, unspecified: Secondary | ICD-10-CM | POA: Diagnosis not present

## 2015-09-21 DIAGNOSIS — K5900 Constipation, unspecified: Secondary | ICD-10-CM | POA: Insufficient documentation

## 2015-09-21 DIAGNOSIS — M109 Gout, unspecified: Secondary | ICD-10-CM | POA: Diagnosis not present

## 2015-09-21 DIAGNOSIS — S20211A Contusion of right front wall of thorax, initial encounter: Secondary | ICD-10-CM | POA: Insufficient documentation

## 2015-09-21 DIAGNOSIS — M810 Age-related osteoporosis without current pathological fracture: Secondary | ICD-10-CM | POA: Diagnosis not present

## 2015-09-21 DIAGNOSIS — S5012XA Contusion of left forearm, initial encounter: Secondary | ICD-10-CM | POA: Insufficient documentation

## 2015-09-21 DIAGNOSIS — N183 Chronic kidney disease, stage 3 unspecified: Secondary | ICD-10-CM

## 2015-09-21 DIAGNOSIS — M25511 Pain in right shoulder: Secondary | ICD-10-CM | POA: Diagnosis not present

## 2015-09-21 DIAGNOSIS — S5011XA Contusion of right forearm, initial encounter: Secondary | ICD-10-CM | POA: Insufficient documentation

## 2015-09-21 DIAGNOSIS — S0083XA Contusion of other part of head, initial encounter: Secondary | ICD-10-CM | POA: Diagnosis not present

## 2015-09-21 DIAGNOSIS — Z86010 Personal history of colonic polyps: Secondary | ICD-10-CM | POA: Insufficient documentation

## 2015-09-21 DIAGNOSIS — F329 Major depressive disorder, single episode, unspecified: Secondary | ICD-10-CM | POA: Insufficient documentation

## 2015-09-21 DIAGNOSIS — S0990XA Unspecified injury of head, initial encounter: Secondary | ICD-10-CM | POA: Diagnosis present

## 2015-09-21 DIAGNOSIS — T148 Other injury of unspecified body region: Secondary | ICD-10-CM | POA: Diagnosis not present

## 2015-09-21 DIAGNOSIS — S299XXA Unspecified injury of thorax, initial encounter: Secondary | ICD-10-CM | POA: Diagnosis not present

## 2015-09-21 DIAGNOSIS — R51 Headache: Secondary | ICD-10-CM | POA: Diagnosis not present

## 2015-09-21 DIAGNOSIS — Y998 Other external cause status: Secondary | ICD-10-CM | POA: Insufficient documentation

## 2015-09-21 DIAGNOSIS — S20212A Contusion of left front wall of thorax, initial encounter: Secondary | ICD-10-CM | POA: Diagnosis not present

## 2015-09-21 DIAGNOSIS — Y9389 Activity, other specified: Secondary | ICD-10-CM | POA: Insufficient documentation

## 2015-09-21 DIAGNOSIS — S4991XA Unspecified injury of right shoulder and upper arm, initial encounter: Secondary | ICD-10-CM | POA: Diagnosis not present

## 2015-09-21 DIAGNOSIS — S0093XA Contusion of unspecified part of head, initial encounter: Secondary | ICD-10-CM

## 2015-09-21 DIAGNOSIS — F0280 Dementia in other diseases classified elsewhere without behavioral disturbance: Secondary | ICD-10-CM | POA: Insufficient documentation

## 2015-09-21 DIAGNOSIS — Z8673 Personal history of transient ischemic attack (TIA), and cerebral infarction without residual deficits: Secondary | ICD-10-CM | POA: Diagnosis not present

## 2015-09-21 DIAGNOSIS — E785 Hyperlipidemia, unspecified: Secondary | ICD-10-CM | POA: Insufficient documentation

## 2015-09-21 DIAGNOSIS — W19XXXA Unspecified fall, initial encounter: Secondary | ICD-10-CM

## 2015-09-21 DIAGNOSIS — T1490 Injury, unspecified: Secondary | ICD-10-CM | POA: Diagnosis not present

## 2015-09-21 DIAGNOSIS — Z7982 Long term (current) use of aspirin: Secondary | ICD-10-CM | POA: Diagnosis not present

## 2015-09-21 DIAGNOSIS — G309 Alzheimer's disease, unspecified: Secondary | ICD-10-CM | POA: Diagnosis not present

## 2015-09-21 DIAGNOSIS — K222 Esophageal obstruction: Secondary | ICD-10-CM | POA: Diagnosis not present

## 2015-09-21 LAB — CBC WITH DIFFERENTIAL/PLATELET
BASOS PCT: 0 %
Basophils Absolute: 0 10*3/uL (ref 0.0–0.1)
EOS PCT: 2 %
Eosinophils Absolute: 0.2 10*3/uL (ref 0.0–0.7)
HCT: 36.7 % (ref 36.0–46.0)
Hemoglobin: 11.9 g/dL — ABNORMAL LOW (ref 12.0–15.0)
LYMPHS ABS: 2.3 10*3/uL (ref 0.7–4.0)
Lymphocytes Relative: 24 %
MCH: 31.8 pg (ref 26.0–34.0)
MCHC: 32.4 g/dL (ref 30.0–36.0)
MCV: 98.1 fL (ref 78.0–100.0)
MONOS PCT: 6 %
Monocytes Absolute: 0.6 10*3/uL (ref 0.1–1.0)
NEUTROS ABS: 6.4 10*3/uL (ref 1.7–7.7)
Neutrophils Relative %: 68 %
PLATELETS: 72 10*3/uL — AB (ref 150–400)
RBC: 3.74 MIL/uL — AB (ref 3.87–5.11)
RDW: 13.7 % (ref 11.5–15.5)
WBC: 9.5 10*3/uL (ref 4.0–10.5)

## 2015-09-21 LAB — PROTIME-INR
INR: 1.01 (ref 0.00–1.49)
Prothrombin Time: 13.5 seconds (ref 11.6–15.2)

## 2015-09-21 LAB — COMPREHENSIVE METABOLIC PANEL
ALBUMIN: 3.3 g/dL — AB (ref 3.5–5.0)
ALK PHOS: 88 U/L (ref 38–126)
ALT: 14 U/L (ref 14–54)
ANION GAP: 6 (ref 5–15)
AST: 20 U/L (ref 15–41)
BILIRUBIN TOTAL: 0.4 mg/dL (ref 0.3–1.2)
BUN: 19 mg/dL (ref 6–20)
CALCIUM: 9.4 mg/dL (ref 8.9–10.3)
CO2: 30 mmol/L (ref 22–32)
Chloride: 100 mmol/L — ABNORMAL LOW (ref 101–111)
Creatinine, Ser: 1.18 mg/dL — ABNORMAL HIGH (ref 0.44–1.00)
GFR calc Af Amer: 44 mL/min — ABNORMAL LOW (ref >60)
GFR calc non Af Amer: 38 mL/min — ABNORMAL LOW (ref >60)
GLUCOSE: 98 mg/dL (ref 65–99)
Potassium: 4.9 mmol/L (ref 3.5–5.1)
Sodium: 136 mmol/L (ref 135–145)
TOTAL PROTEIN: 6.7 g/dL (ref 6.5–8.1)

## 2015-09-21 LAB — TROPONIN I: Troponin I: 0.03 ng/mL (ref <0.031)

## 2015-09-21 MED ORDER — HYDROCODONE-ACETAMINOPHEN 5-325 MG PO TABS
1.0000 | ORAL_TABLET | Freq: Once | ORAL | Status: AC
  Administered 2015-09-21: 1 via ORAL
  Filled 2015-09-21: qty 1

## 2015-09-21 NOTE — Assessment & Plan Note (Signed)
Check CBC, B-12, folate at recommendation [facility] cardiology who request records faxed to [facility] [phone]

## 2015-09-21 NOTE — ED Notes (Signed)
Daughter reports that patient had been at cardiologist & family practice earlier today - prior to fall outside of apartment. Patient was returning to home after appts when she fell outside of apartment complex.

## 2015-09-21 NOTE — ED Notes (Signed)
Patient arrived to ED via [facility]. EMS reports witnessed fall at patient's apartment complex when patient "lost her footing." No LOC. Patient/family denied patient striking head. EMS reports bruising/swelling L wrist, and that patient now c/o bilateral lateral rib pain. BP 150/95, Pulse 84, Resp 16, 95% on room air. CBG 102. Patient speaks no [place] - per EMS. Daughter at bedside - speaks [place].

## 2015-09-21 NOTE — ED Notes (Signed)
Patient still in xray at this time. 

## 2015-09-21 NOTE — ED Provider Notes (Signed)
CSN: [id number]     Arrival date & time 09/21/15  1902 History   First MD Initiated Contact with Patient 09/21/15 1906     Chief Complaint  Patient presents with  . Fall   PT IS A SPANISH SPEAKER.  HER DAUGHTER-IN-LAW AND SON TRANSLATES.  PT WAS WALKING AND TRIPPED.  PT HIT HER HEAD BUT NO LOC.  THE PT C/O PAIN TO HER HEAD, RIGHT SHOULDER, LEFT FOREARM, AND BILATERAL CHEST WALL.  PT SAW HER CARDIOLOGIST TODAY AND WAS TOLD TO STOP THE ASA B/C PLT COUNTS WERE LOW.  CARDS ALSO CHANGED PT'S BP MEDS TO METOPROLOL TO CONTROL THE A.FIB EPISODES.  (Consider location/radiation/quality/duration/timing/severity/associated sxs/prior Treatment) Patient is a 82 y.o. female presenting with fall. The history is provided by the patient and a relative. The history is limited by a language barrier. A language interpreter was used.  Fall This is a new problem. The current episode started less than 1 hour ago. Associated symptoms include headaches.    Past Medical History  Diagnosis Date  . Cervicalgia   . Peripheral neuropathy ([place])   . Hypertension   . Osteoporosis   . Insomnia   . Arthritis   . Headache   . Hypothyroidism   . Right rotator cuff tear   . OA (osteoarthritis)     RIGHT SHOULDER AC JOINT  . Moderate dementia   . Gait instability   . Complete heart block ([place])   . History of colon polyps   . Diverticulosis of colon   . SUI (stress urinary incontinence, female)   . Lumbar stenosis   . Cryptogenic stroke ([place]) S/P  LOOP RECORDER IMPLANT    DX  01/2013  --  SYNCOPAL EPISODE --  EPISODIC  ATRIAL TACHY/ FIBULATION  AND PAUSES  . Frequency of urination   . Chronic constipation   . Glaucoma of both eyes   . Hearing loss of both ears     no hearing aids  . Dry eyes   . Cervicalgia 05/18/2009    Qualifier: Diagnosis of  By: Walker Kehr MD, Patrick Jupiter    . DIVERTICULOSIS OF COLON 06/29/2006    Qualifier: Diagnosis of  By: Benna Dunks    . Combined visual hearing impairment 11/12/2013  . Urinary  incontinence, mixed 06/29/2006    Qualifier: Diagnosis of  By: Benna Dunks    . INTERSTITIAL CYSTITIS 09/21/2009    Qualifier: Diagnosis of  By: Sarita Haver  MD, Coralyn Mark    . Syncope and collapse 02/09/2013  . Right knee pain 01/15/2014  . Polymyalgia rheumatica ([place]) 11/08/2013  . Loss of weight 04/05/2010    Qualifier: Diagnosis of  By: Walker Kehr MD, Patrick Jupiter    . Macular degeneration, age related, nonexudative 04/18/2014  . Hearing loss of aging 05/09/2014  . Presence of permanent cardiac pacemaker   . Shortness of breath dyspnea   . Pacemaker   . Constipation   . Weakness 08/29/2013  . Urinary frequency 06/05/2015  . Shortness of breath 07/21/2014  . Persistent headaches 09/06/2013  . Rotator cuff tear arthropathy 09/11/2012    Likely related to rotator cuff tendinopathy seen on musculoskeletal ultrasound, and GH DJD.  Complete tear of right supra and infraspinatus on MRI 10/2012     . S/P shoulder replacement 03/20/2015  . Spinal stenosis of lumbar region 10/03/2012    Patient has significant low back pain related to spinal stenosis and DJD, DDD.  We had a long discussion about treatment options which are quite limited. She is likely  not a good surgical candidate for laminectomy. I am not sure about the usefulness of epidural steroid injection. We'll plan on restarting meloxicam after a long discussion of the pros and cons and risks of chronic kidney disease.  The patient and her family feel that the benefit of good pain control is the small risk.  Additionally she will followup with her primary care provider for creatinine monitoring.  Will obtain physical therapy for TENS unit trial.  Will refer to pain management for consideration of epidural steroid injection as that worked well in the past for cervical pain Followup here as needed   . PERIPHERAL NEUROPATHY 03/29/2010    Qualifier: Diagnosis of  By: Walker Kehr MD, Patrick Jupiter    . 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  OSTEOPOROSIS 03/20/2008    Qualifier: Diagnosis of  By: Jerline Pain MD, Anderson Malta    . OSTEOARTHRITIS, MULTI SITES 06/29/2006    Qualifier: Diagnosis of  By: Benna Dunks    . Mobitz type 2 second degree heart block 08/29/2013  . MASS, LUNG 04/05/2010    Qualifier: Diagnosis of  By: Walker Kehr MD, Patrick Jupiter    . Impaired mobility and ADLs 05/30/2014  . Hyperlipidemia 02/27/2015  . HTN (hypertension) 06/29/2006    Isolated Systolic Hypertension - Ambulatory BP  Monitoring result 4/28-29/2015   . HERNIA, INCISIONAL VENTRAL W/O OBST/GNGR 10/25/2006    Has been seen by surgery in the past and told that this is not dangerous and should only be treated particularly bothersome    . Gout 09/21/2013  . Glaucoma 04/18/2014  . Depressive disorder 06/29/2006        . CYSTOCELE/RECTOCELE/PROLAPSE,UNSPEC. 06/29/2006    Qualifier: Diagnosis of  By: Benna Dunks    . CKD (chronic kidney disease) stage 3, GFR 30-59 ml/min 09/03/2013  . Chronic pain syndrome 05/09/2014  . Chronic diastolic heart failure ([place]) 09/01/2013  . Carpal tunnel syndrome 06/29/2006    Qualifier: Diagnosis of  By: Benna Dunks    . Alzheimer's dementia 07/30/2013    04/17/14 MoCA - Blind (Spanish version); 9 out of 22.  Correct MoCA = 12 out of 30.     Past Surgical History  Procedure Laterality Date  . Loop recorder implant  02-08-13; 08-30-13    MDT LinQ implanted by Dr Lovena Le for syncope, explanted 08-30-13 after CHB identified  . Total abdominal hysterectomy w/ bilateral salpingoophorectomy  03-15-2005  . Carpal tunnel release Right   . Eye surgery      cataract, bilat.  . Pacemaker insertion  08-30-2013    MDT ADDRL1 pacemaker implanted by Dr Rayann Heman for CHB  . Loop recorder implant N/A 02/11/2013    Procedure: LOOP RECORDER IMPLANT;  Surgeon: Evans Lance, MD;  Location: [facility] CATH LAB;  Service: Cardiovascular;  Laterality: N/A;  . Permanent pacemaker insertion N/A 08/30/2013    Procedure: PERMANENT PACEMAKER INSERTION;  Surgeon: Coralyn Mark, MD;   Location: [facility] CATH LAB;  Service: Cardiovascular;  Laterality: N/A;  . Insert / replace / remove pacemaker    . Reverse shoulder arthroplasty Right 03/20/2015    Procedure: RIGHT REVERSE SHOULDER ARTHROPLASTY;  Surgeon: Netta Cedars, MD;  Location: [facility];  Service: Orthopedics;  Laterality: Right;  . Esophagogastroduodenoscopy N/A 09/15/2015    Procedure: ESOPHAGOGASTRODUODENOSCOPY (EGD);  Surgeon: Clarene Essex, MD;  Location[facility] ENDOSCOPY;  Service: Endoscopy;  Laterality: N/A;   Family History  Problem Relation Age of Onset  . Kidney disease Mother   . Heart attack  Neg Hx   . Stroke Neg Hx    Social History  Substance Use Topics  . Smoking status: Never Smoker   . Smokeless tobacco: None  . Alcohol Use: No   OB History    Gravida Para Term Preterm AB TAB SAB Ectopic Multiple Living   0 0 0 0 0 0 0 0       Review of Systems  Musculoskeletal:       RIGHT SHOULDER TENDERNESS, BILATERAL LOWER RIB TENDERNESS, LEFT FOREARM TENDERNESS  Neurological: Positive for headaches.  Hematological: Bruises/bleeds easily.  All other systems reviewed and are negative.     Allergies  Chlorthalidone; Honey bee treatment; Verapamil; Adhesive; and Amlodipine  Home Medications   Prior to Admission medications   Medication Sig Start Date End Date Taking? Authorizing Provider  acetaminophen (TYLENOL) 325 MG tablet Take 650 mg by mouth every 6 (six) hours as needed for mild pain.    Yes Historical Provider, MD  allopurinol (ZYLOPRIM) 100 MG tablet Take 1 tablet (100 mg total) by mouth daily. 11/17/14  Yes Smiley Houseman, MD  beta carotene w/minerals (OCUVITE) tablet Take 1 tablet by mouth daily. 04/17/14  Yes Coral Spikes, DO  Calcium Carbonate-Vitamin D (CALTRATE 600+D) 600-400 MG-UNIT per tablet Take 1 tablet by mouth 2 (two) times daily.     Yes Historical Provider, MD  cycloSPORINE (RESTASIS) 0.05 % ophthalmic emulsion Place 1 drop into both eyes at bedtime.   Yes Historical Provider, MD    diclofenac sodium (VOLTAREN) 1 % GEL apply 2 grams to affected area four times a day if needed for pain 07/09/15  Yes Blane Ohara McDiarmid, MD  DULoxetine (CYMBALTA) 60 MG capsule take 1 capsule by mouth once daily 08/31/15  Yes Lind Covert, MD  esomeprazole (NEXIUM) 40 MG capsule take 1 capsule by mouth twice a day 08/25/15  Yes Blane Ohara McDiarmid, MD  fesoterodine (TOVIAZ) 8 MG TB24 tablet Take 1 tablet (8 mg total) by mouth daily. 06/04/15  Yes Blane Ohara McDiarmid, MD  Fish Oil-Cholecalciferol (FISH OIL + D3 PO) Take 1 capsule by mouth daily.   Yes Historical Provider, MD  gabapentin (NEURONTIN) 100 MG capsule take 2 capsules by mouth three times a day 07/09/15  Yes Blane Ohara McDiarmid, MD  ibuprofen (ADVIL,MOTRIN) 100 MG chewable tablet Chew 100-200 mg by mouth every 8 (eight) hours as needed (for pain.).    Yes Historical Provider, MD  levothyroxine (SYNTHROID, LEVOTHROID) 100 MCG tablet take 1 tablet by mouth once daily 06/29/15  Yes Todd D McDiarmid, MD  lidocaine (XYLOCAINE) 5 % ointment apply to affected area once daily if needed ( DO NOT EXCEED 5 GRAMS OF OINTMENT) 07/31/15  Yes Blane Ohara McDiarmid, MD  losartan (COZAAR) 50 MG tablet Take 1 tablet by mouth daily. 08/17/15  Yes Historical Provider, MD  ondansetron (ZOFRAN) 4 MG tablet Take 2 tablets (8 mg total) by mouth every 8 (eight) hours as needed for nausea or vomiting. 07/09/15  Yes Todd D McDiarmid, MD  polyethylene glycol [facility]) packet Take 17 g by mouth daily. Patient taking differently: Take 17 g by mouth daily as needed for mild constipation.  07/22/14  Yes Leone Haven, MD  RA ASPIRIN EC 81 MG EC tablet take 1 tablet by mouth once daily 06/01/15  Yes Smiley Houseman, MD  sucralfate (CARAFATE) 1 g tablet Take 1 tablet (1 g total) by mouth 4 (four) times daily -  with meals and at bedtime. 06/09/15  Yes  Blane Ohara McDiarmid, MD  TRAVATAN Z 0.004 % SOLN ophthalmic solution Place 1 drop into both eyes at bedtime.  02/20/13  Yes Historical  Provider, MD  traZODone (DESYREL) 50 MG tablet take 1 tablet by mouth at bedtime 07/02/15  Yes Todd D McDiarmid, MD  colchicine 0.6 MG tablet Take 1 tablet (0.6 mg total) by mouth 2 (two) times daily as needed (GOUT). Patient not taking: Reported on 09/21/2015 07/09/15   Blane Ohara McDiarmid, MD  furosemide (LASIX) 20 MG tablet Take 1 tablet (20 mg total) by mouth daily. Patient not taking: Reported on 09/21/2015 07/09/15   Blane Ohara McDiarmid, MD  lidocaine (XYLOCAINE) 5 % ointment apply to affected area once daily if needed ( DO NOT EXCEED 5 GRAMS OF OINTMENT) Patient not taking: Reported on 09/21/2015 07/31/15   Blane Ohara McDiarmid, MD  oxyCODONE-acetaminophen (ROXICET) 5-325 MG tablet Take 1-2 tablets by mouth every 4 (four) hours as needed for severe pain. Patient not taking: Reported on 09/21/2015 03/20/15   Netta Cedars, MD  RA COL-RITE 100 MG capsule take 1 capsule by mouth once daily if needed Patient not taking: Reported on 09/21/2015 07/09/15   Blane Ohara McDiarmid, MD   BP 171/79 mmHg  Pulse 84  SpO2 95% Physical Exam  Constitutional: She is oriented to person, place, and time. She appears well-developed and well-nourished.  HENT:  Head: Normocephalic.    Right Ear: External ear normal.  Left Ear: External ear normal.  Nose: Nose normal.  Mouth/Throat: Oropharynx is clear and moist.  Eyes: Conjunctivae and EOM are normal. Pupils are equal, round, and reactive to light.  Neck: Normal range of motion. Neck supple.  Cardiovascular: Normal rate, regular rhythm, normal heart sounds and intact distal pulses.   Pulmonary/Chest: Effort normal and breath sounds normal.  Abdominal: Soft. Bowel sounds are normal.  Musculoskeletal: Normal range of motion.       Arms: Neurological: She is alert and oriented to person, place, and time.  Skin: Skin is warm and dry.  Psychiatric: She has a normal mood and affect. Her behavior is normal. Judgment and thought content normal.  Nursing note and vitals  reviewed.   ED Course  Procedures (including critical care time) Labs Review Labs Reviewed  CBC WITH DIFFERENTIAL/PLATELET - Abnormal; Notable for the following:    RBC 3.74 (*)    Hemoglobin 11.9 (*)    Platelets 72 (*)    All other components within normal limits  COMPREHENSIVE METABOLIC PANEL - Abnormal; Notable for the following:    Chloride 100 (*)    Creatinine, Ser 1.18 (*)    Albumin 3.3 (*)    GFR calc non Af Amer 38 (*)    GFR calc Af Amer 44 (*)    All other components within normal limits  PROTIME-INR  TROPONIN I    Imaging Review Dg Chest 2 View  09/21/2015  CLINICAL DATA:  Bilateral rib pain status post fall. EXAM: CHEST  2 VIEW COMPARISON:  None. FINDINGS: Dual lead cardiac pacemaker is seen. Right shoulder arthroplasty is also present. Cardiomediastinal silhouette is enlarged. Mediastinal contours appear intact. The aorta is torturous and contains atherosclerotic calcifications. There is no evidence of focal airspace consolidation, pleural effusion or pneumothorax. Lung volumes are low. There is a right infrahilar opacity, which may represent crowding of vascular markings. Osseous structures are without acute abnormality. No evidence of displaced rib fractures. Soft tissues are grossly normal. IMPRESSION: No active cardiopulmonary disease. Low lung volumes. Cardiomegaly. Electronically Signed  By: Fidela Salisbury M.D.   On: 09/21/2015 20:08   Dg Shoulder Right  09/21/2015  CLINICAL DATA:  Fall right shoulder pain. EXAM: RIGHT SHOULDER - 2+ VIEW COMPARISON:  03/20/2015 FINDINGS: Examination demonstrates right shoulder prosthesis intact and unchanged. Degenerative change of the [facility] joint. Remainder of the exam is unchanged. IMPRESSION: No acute findings. Electronically Signed   By: Marin Olp M.D.   On: 09/21/2015 20:06   Dg Forearm Left  09/21/2015  CLINICAL DATA:  Status post fall, with swelling and bruising about the left wrist. Initial encounter. EXAM: LEFT  FOREARM - 2 VIEW COMPARISON:  Left wrist radiographs performed 07/26/2014 FINDINGS: There is no evidence of acute fracture or dislocation. Mild deformity is noted about the proximal to mid radius and ulna, and about the distal radius, reflecting remote injury. The carpal rows appear grossly intact, and demonstrate normal alignment. No elbow joint effusion is seen. Scattered vascular calcifications are seen. Soft tissue swelling is noted about the distal forearm. IMPRESSION: No evidence of acute fracture or dislocation. Scattered vascular calcifications seen. Electronically Signed   By: Garald Balding M.D.   On: 09/21/2015 20:06   Ct Head Wo Contrast  09/22/2015  CLINICAL DATA:  82 year old female post fall at apartment complex, now with pain. No loss of consciousness. EXAM: CT HEAD WITHOUT CONTRAST TECHNIQUE: Contiguous axial images were obtained from the base of the skull through the vertex without intravenous contrast. COMPARISON:  Head CT 02/08/2013 FINDINGS: Brain: No evidence of acute infarction, hemorrhage, extra-axial collection, ventriculomegaly, or mass effect. Age-related atrophy and chronic small vessel ischemia. Vascular: No hyperdense vessel. 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 Atherosclerosis of skullbase vasculature. Skull: Negative for fracture or focal lesion. Sinuses/Orbits: No acute findings. Other: None. IMPRESSION: No acute intracranial abnormality. Stable chronic change from priors. Electronically Signed   By: Jeb Levering M.D.   On: 09/22/2015 00:15   I have personally reviewed and evaluated these images and lab results as part of my medical decision-making.   EKG Interpretation None      MDM  PT ALREADY KNOWS TO STOP ASA TO HELP THE LOW PLT.  PT HAS BEEN OBSERVED HERE FOR A NUMBER OF HOURS AND IS NEUROLOGICALLY STABLE. Final diagnoses:  Thrombocytopenia, unspecified ([place])  Accidental fall  Chest wall contusion, left, initial  encounter  Chest wall contusion, right, initial encounter  Forearm contusion, left, initial encounter  Forearm contusion, right, initial encounter  Head contusion, initial encounter        Isla Pence, MD 09/24/15 1234

## 2015-09-21 NOTE — Assessment & Plan Note (Signed)
Check BMET

## 2015-09-21 NOTE — Assessment & Plan Note (Addendum)
No return symptoms of esophageal obstruction  - Continue dysphagia 3 diet as recommended by speech

## 2015-09-21 NOTE — Progress Notes (Signed)
   Subjective:    Patient ID: Jordan [name], female    DOB: [birth year], 82 y.o.   MRN: [id number]  Seen for Hospital follow-up   CC: Follow-up for dysphagia  She denies any difficulty swallowing since her hospital discharge.  She has been following the dysphagia 3 diet slowly been regaining her appetite.  She denies any heartburn, reflux.  She denies any diarrhea or blood in her stool.  Denies any chest pain, palpitations or dizziness.  She does continue to endorse easy bruising, was recently seen by cardiology for increasing episodes of atrial fibrillation.   Review of Systems   See HPI for ROS. Objective:  BP 145/43 mmHg  Pulse 70  Temp(Src) 98.7 F (37.1 C) (Oral)  Ht 4\' 5"  (1.346 m)  Wt 140 lb (63.504 kg)  BMI 35.05 kg/m2  General: NAD Cardiac: RRR, normal heart sounds, no murmurs. 2+ radial and PT pulses bilaterally Respiratory: CTAB, normal effort Skin: Multiple scattered ecchymosis noted    Assessment & Plan:   Dysphagia No return symptoms of esophageal obstruction  - Continue dysphagia 3 diet as recommended by speech  CKD (chronic kidney disease) stage 3, GFR 30-59 ml/min Check BMET  Thrombocytopenia, unspecified (HCC) Check CBC, B-12, folate at recommendation [facility] cardiology who request records faxed to [facility] [phone]

## 2015-09-21 NOTE — ED Notes (Signed)
Patient back in room at this time.

## 2015-09-22 ENCOUNTER — Encounter: Payer: Self-pay | Admitting: Family Medicine

## 2015-09-22 DIAGNOSIS — S20211A Contusion of right front wall of thorax, initial encounter: Secondary | ICD-10-CM | POA: Diagnosis not present

## 2015-09-22 LAB — BASIC METABOLIC PANEL WITH GFR
BUN: 21 mg/dL (ref 7–25)
CALCIUM: 9.2 mg/dL (ref 8.6–10.4)
CO2: 29 mmol/L (ref 20–31)
CREATININE: 1.17 mg/dL — AB (ref 0.60–0.88)
Chloride: 99 mmol/L (ref 98–110)
GFR, EST AFRICAN AMERICAN: 46 mL/min — AB (ref >=60)
GFR, EST NON AFRICAN AMERICAN: 40 mL/min — AB (ref >=60)
Glucose, Bld: 94 mg/dL (ref 65–99)
Potassium: 5 mmol/L (ref 3.5–5.3)
SODIUM: 136 mmol/L (ref 135–146)

## 2015-09-22 LAB — CBC
HCT: 37.9 % (ref 35.0–45.0)
Hemoglobin: 12.4 g/dL (ref 11.7–15.5)
MCH: 31.7 pg (ref 27.0–33.0)
MCHC: 32.7 g/dL (ref 32.0–36.0)
MCV: 96.9 fL (ref 80.0–100.0)
MPV: 13 fL — AB (ref 7.5–12.5)
PLATELETS: 88 10*3/uL — AB (ref 140–400)
RBC: 3.91 MIL/uL (ref 3.80–5.10)
RDW: 14.2 % (ref 11.0–15.0)
WBC: 9 10*3/uL (ref 3.8–10.8)

## 2015-09-22 LAB — VITAMIN B12: VITAMIN B 12: 717 pg/mL (ref 200–1100)

## 2015-09-22 LAB — FOLATE: Folate: >24 ng/mL (ref >5.4)

## 2015-09-22 MED ORDER — HYDROCODONE-ACETAMINOPHEN 5-325 MG PO TABS
1.0000 | ORAL_TABLET | Freq: Once | ORAL | Status: AC
  Administered 2015-09-22: 1 via ORAL
  Filled 2015-09-22: qty 1

## 2015-09-22 MED ORDER — HYDROCODONE-ACETAMINOPHEN 5-325 MG PO TABS: 1.0000 | ORAL_TABLET | ORAL | Status: DC | PRN

## 2015-09-22 NOTE — Telephone Encounter (Signed)
Gracee calling again for verbal orders for PT 2x a week for 6 weeks.

## 2015-09-22 NOTE — ED Notes (Signed)
Pt ambulated maintaining 02 sat of 97%.  Pt discharged home with family

## 2015-09-23 ENCOUNTER — Telehealth: Payer: Self-pay | Admitting: Family Medicine

## 2015-09-23 NOTE — Telephone Encounter (Signed)
Will forward to Dr. Berkley Harvey to advise on results. [name],CMA

## 2015-09-23 NOTE — Telephone Encounter (Signed)
Mother called and would like to know what her mother's lab results were and also send a copy of these to her Cardiologist. Blima Rich

## 2015-09-24 ENCOUNTER — Telehealth: Payer: Self-pay | Admitting: Family Medicine

## 2015-09-24 ENCOUNTER — Other Ambulatory Visit: Payer: Self-pay | Admitting: Family Medicine

## 2015-09-24 NOTE — Telephone Encounter (Signed)
Daughter called back to ask that provider contact her to discuss mother's situation.  Don't want to bring her out if not necessary.  Patient is having a lot of discomfort.

## 2015-09-24 NOTE — Telephone Encounter (Signed)
Spoke with daughter and she states that patient is not moving well today due to her fall.  She is complaining of soreness, which I told her would be normal since she just had a fall on Monday.  Also she states that patient has been constipated for a few days including before her fall and has tried miralax daily with no relief.  Asked daughter if patient has tried prune juice, adding fresh fruits and veggies to her diet and she informed me that they have.  [name] would like to know if she can do a suppository or enema for patient to give her some relief.  She is also calling to get results from her most recent labs with Dr. Berkley Harvey. Kristain Hu,CMA

## 2015-09-24 NOTE — Telephone Encounter (Signed)
Daughter is calling because her mother fell a few days ago. They did go to the ER and nothing was broken. She seems to be more sore now than the day of the fall. Her mother has not had a bowel movement in a few days and she is concerned about this. She would like [name] to call and speak with her. Blima Rich

## 2015-09-25 ENCOUNTER — Other Ambulatory Visit: Payer: Self-pay | Admitting: Family Medicine

## 2015-09-25 NOTE — Telephone Encounter (Signed)
Hospitalized for food impaction last week In a lot of pain where she fell onto cement last week.  Seen at ED with no fractures on XR.   She very constipated Pt was taking Percocet for pain but stopped last two days because of no BM for 5 days.Miralax for  Last  two day, along with fresh fruit.  Both interventions not working.Not working It has been five without BM. In past  Colace does not seem to work in past.   Platelets were low since 09/14/15 at .   last norm 06/04/15 175K. Marland Kitchen   and continued to be low, saw dr Berkley Harvey, blood work request from cardiologist- Dr Christen Butter who  Stopped losartan and Aspirin bc of increased bruising (not at sites of fall impact sites) and increased pt metoprolol XL to 50 mg daily for episodes Afib with RVR on PACEMAKER telephonic transmission.   Pt taking no NSAID, only Acetaminophen and Percocet 5/325 for pain.   A/P 1. New onset thrombocytopenia with preserved other cell lines - new bruising - Normal B12 and Folate last week. Normal TSH last week.  2. Constipation - new - question related to opiate analgesia following soft tissue contussion after fall  Recommend - Add Senokot tab two tab once or twice a day - Continue Miralax daily - Come in for evaluation by Dr Micheil Klaus next week of bruising and thrombocytopenia.

## 2015-09-29 NOTE — Telephone Encounter (Signed)
Patient daughter calling to scheduled appointment with PCP next week but MD has nothing available, will forward to team to find out where to schedule patient. Also daughter states that patient is starting show signs of ?impaction.

## 2015-09-29 NOTE — Telephone Encounter (Signed)
No longer a Dr. Caryl Bis patient

## 2015-09-30 NOTE — Telephone Encounter (Signed)
Will forward to MD to advise on when patient can be scheduled.  [name],CMA

## 2015-10-01 DIAGNOSIS — M4806 Spinal stenosis, lumbar region: Secondary | ICD-10-CM | POA: Diagnosis not present

## 2015-10-01 DIAGNOSIS — I5032 Chronic diastolic (congestive) heart failure: Secondary | ICD-10-CM | POA: Diagnosis not present

## 2015-10-01 DIAGNOSIS — M150 Primary generalized (osteo)arthritis: Secondary | ICD-10-CM | POA: Diagnosis not present

## 2015-10-01 DIAGNOSIS — G894 Chronic pain syndrome: Secondary | ICD-10-CM | POA: Diagnosis not present

## 2015-10-01 DIAGNOSIS — I130 Hypertensive heart and chronic kidney disease with heart failure and stage 1 through stage 4 chronic kidney disease, or unspecified chronic kidney disease: Secondary | ICD-10-CM | POA: Diagnosis not present

## 2015-10-01 DIAGNOSIS — M6281 Muscle weakness (generalized): Secondary | ICD-10-CM | POA: Diagnosis not present

## 2015-10-01 NOTE — Telephone Encounter (Signed)
Please clarify with daughter of patient if by impaction she means esophageal impactionfood getting stuck in her throat) or a fecal impaction.  If fecal impaction, patient may be seen by SDA.  If esophageal impaction (food stuck in patient's throat) then patient needs to be seen in the ED immediately.   Thank you

## 2015-10-01 NOTE — Telephone Encounter (Signed)
Daughter called again. She says impaction is not an issue now. She wants to know when Dr McDiarmid will see her this week.  She states Dr McDiarmid told pt last week he wanted to see her this week.  He also stated he would be doing some research about her platelets.  Daughter is waiting for drs decision about when he wants to see the pt.   Daughter had requested from Dr Berkley Harvey copies of the report from blood work and endoscopy f

## 2015-10-02 ENCOUNTER — Telehealth: Payer: Self-pay | Admitting: Family Medicine

## 2015-10-02 DIAGNOSIS — M4806 Spinal stenosis, lumbar region: Secondary | ICD-10-CM | POA: Diagnosis not present

## 2015-10-02 DIAGNOSIS — H9113 Presbycusis, bilateral: Secondary | ICD-10-CM

## 2015-10-02 DIAGNOSIS — I130 Hypertensive heart and chronic kidney disease with heart failure and stage 1 through stage 4 chronic kidney disease, or unspecified chronic kidney disease: Secondary | ICD-10-CM | POA: Diagnosis not present

## 2015-10-02 DIAGNOSIS — I5032 Chronic diastolic (congestive) heart failure: Secondary | ICD-10-CM | POA: Diagnosis not present

## 2015-10-02 DIAGNOSIS — M150 Primary generalized (osteo)arthritis: Secondary | ICD-10-CM | POA: Diagnosis not present

## 2015-10-02 DIAGNOSIS — M6281 Muscle weakness (generalized): Secondary | ICD-10-CM | POA: Diagnosis not present

## 2015-10-02 DIAGNOSIS — G894 Chronic pain syndrome: Secondary | ICD-10-CM | POA: Diagnosis not present

## 2015-10-02 NOTE — Telephone Encounter (Signed)
Daughter calls requesting ENT referral to Dr. Jodi Marble @ [facility] for hearing evaluation (hearing aides).

## 2015-10-02 NOTE — Telephone Encounter (Signed)
2:30 on Monday will be fine.

## 2015-10-02 NOTE — Telephone Encounter (Signed)
Will forward to MD. [name],CMA  

## 2015-10-02 NOTE — Telephone Encounter (Signed)
Please contact patient's dgt, [name] to see if Ms Coriell came come into [facility] on this Monday, 10/05/15, around 1:30 to 2:00 pm. Schedule as a Same Day Visit but have [place] paged to see patient.

## 2015-10-02 NOTE — Telephone Encounter (Signed)
Will check to see when pcp would like patient to be worked in. [employer]

## 2015-10-02 NOTE — Telephone Encounter (Signed)
LM for daughter that patient was scheduled on Dr. Marissa Calamity schedule but that we will page PCP when she arrives at 2:30pm. Jordan [name]

## 2015-10-02 NOTE — Telephone Encounter (Signed)
I spoke with pts mother and she states her mother has an apt at that time. Pts daughter is wondering if you could see pt at 2:30 on Monday? Page, cma.

## 2015-10-05 ENCOUNTER — Other Ambulatory Visit: Payer: Self-pay | Admitting: Family Medicine

## 2015-10-05 ENCOUNTER — Encounter: Payer: Self-pay | Admitting: Family Medicine

## 2015-10-05 ENCOUNTER — Ambulatory Visit (INDEPENDENT_AMBULATORY_CARE_PROVIDER_SITE_OTHER): Payer: Medicare Other | Admitting: Family Medicine

## 2015-10-05 ENCOUNTER — Ambulatory Visit
Admission: RE | Admit: 2015-10-05 | Discharge: 2015-10-05 | Disposition: A | Discharge status: Home / Self Care | Payer: Medicare Other | Source: Ambulatory Visit | Attending: Family Medicine | Admitting: Family Medicine

## 2015-10-05 VITALS — BP 128/52 | HR 63 | Wt 139.0 lb

## 2015-10-05 DIAGNOSIS — G8929 Other chronic pain: Secondary | ICD-10-CM | POA: Insufficient documentation

## 2015-10-05 DIAGNOSIS — S299XXA Unspecified injury of thorax, initial encounter: Secondary | ICD-10-CM | POA: Diagnosis not present

## 2015-10-05 DIAGNOSIS — K5901 Slow transit constipation: Secondary | ICD-10-CM | POA: Diagnosis not present

## 2015-10-05 DIAGNOSIS — D696 Thrombocytopenia, unspecified: Secondary | ICD-10-CM | POA: Diagnosis not present

## 2015-10-05 DIAGNOSIS — M549 Dorsalgia, unspecified: Secondary | ICD-10-CM

## 2015-10-05 DIAGNOSIS — I700 Atherosclerosis of aorta: Secondary | ICD-10-CM | POA: Insufficient documentation

## 2015-10-05 DIAGNOSIS — I2584 Coronary atherosclerosis due to calcified coronary lesion: Secondary | ICD-10-CM

## 2015-10-05 DIAGNOSIS — M546 Pain in thoracic spine: Secondary | ICD-10-CM

## 2015-10-05 DIAGNOSIS — I2510 Atherosclerotic heart disease of native coronary artery without angina pectoris: Secondary | ICD-10-CM | POA: Insufficient documentation

## 2015-10-05 DIAGNOSIS — R0781 Pleurodynia: Secondary | ICD-10-CM

## 2015-10-05 HISTORY — DX: Atherosclerosis of aorta: I70.0

## 2015-10-05 HISTORY — DX: Other chronic pain: G89.29

## 2015-10-05 HISTORY — DX: Pleurodynia: R07.81

## 2015-10-05 HISTORY — DX: Dorsalgia, unspecified: M54.9

## 2015-10-05 HISTORY — DX: Atherosclerotic heart disease of native coronary artery without angina pectoris: I25.10

## 2015-10-05 LAB — CBC WITH DIFFERENTIAL/PLATELET
BASOS ABS: 0 {cells}/uL (ref 0–200)
Basophils Relative: 0 %
EOS ABS: 234 {cells}/uL (ref 15–500)
EOS PCT: 2 %
HCT: 35.9 % (ref 35.0–45.0)
Hemoglobin: 11.7 g/dL (ref 11.7–15.5)
LYMPHS PCT: 22 %
Lymphs Abs: 2574 cells/uL (ref 850–3900)
MCH: 31.3 pg (ref 27.0–33.0)
MCHC: 32.6 g/dL (ref 32.0–36.0)
MCV: 96 fL (ref 80.0–100.0)
MONOS PCT: 6 %
MPV: 11.7 fL (ref 7.5–12.5)
Monocytes Absolute: 702 cells/uL (ref 200–950)
NEUTROS ABS: 8190 {cells}/uL — AB (ref 1500–7800)
NEUTROS PCT: 70 %
PLATELETS: 155 10*3/uL (ref 140–400)
RBC: 3.74 MIL/uL — ABNORMAL LOW (ref 3.80–5.10)
RDW: 13.8 % (ref 11.0–15.0)
WBC: 11.7 10*3/uL — ABNORMAL HIGH (ref 3.8–10.8)

## 2015-10-05 MED ORDER — OXYCODONE HCL 5 MG PO TABS: 5.0000 mg | ORAL_TABLET | Freq: Four times a day (QID) | ORAL | Status: DC | PRN

## 2015-10-05 NOTE — Assessment & Plan Note (Signed)
New problem Further work up planned Thoracic Spine Xray 2 V to look for vertebral fracture after fall

## 2015-10-05 NOTE — Progress Notes (Signed)
   Subjective:    Patient ID: Jordan [name], female    DOB: Dec 01, 1919, 82 y.o.   MRN: [id number] Jordan [name] is accompanied by daughter Sources of clinical information for visit is/are patient, relative(s) and past medical records. Patient's dgt speak english well.  She declined interpreter services.  Nursing assessment for this office visit was reviewed with the patient for accuracy and revision.   HPI  Left chest wall pain Onset: onset after wall getting out of car on 09/21/15 Location: Left anterior lower ribs Quality: aching with sharp pain with movement of torso Severity: 6/10 Function: pain slows particiption in ADLs Duration: 2 weeks Pattern: constant Course: stable Radiation: no Relief: minimal relief from APAP Precipitant: fall onto left side of body while getting out of car. Associated Symptoms: no shortness of chest, no palpitations, no hemoptysis Trauma (Acute or Chronic): yes Prior Diagnostic Testing or Treatments: Seen in ED 09/21/15 where CXR (2V) showed no evidence of rib fracture Relevant PMH/PSH: hx of rib fracture many years ago.   Constipation - Longstanding problem - treating with fruits/vegetables and Miralax one dose a day.  - BM about every 3 days, often hard stool. (+) straining with defecation.  - No blood per rectum, no wieght loss. - Screening colonoscopy 2006 with extensive diverticulosis and 2 polys.  Pt has declined repeat colonoscopy after discussion with Dr Caryl Bis in 2015.   Thrombocytopenia, acute - Low pletelet noted during ED visit with esophageal food impaction 09/21/15.  Previous Plt count in 06/04/15 was 170k.   - Previous Hgb only mildly abnormal and WBCs were WNL.  - Bruising only at sites after injury.  No spontaneous bruising.  No rashes.  - No recent head or chest cold symptoms.  - No oral pain or lesions.  - Denies herbal medications, OTC NSAID, no abdominal distention,    No smoking  Review of Systems See HPI    Objective:   Physical Exam VS reviewed GEN: Alert, Cooperative, Groomed, NAD, HOH HEENT: no conjunctival petechia, palatal petechia.  ABDOMEN: (+)BS, soft, NT, ND, No palpable spleen tip, No liver edge.  LN: no cervical, Supraclavicular, axillary, inguinal lymphadenopathy Chest: tenderness of anterolateral left lower rib cage, no bruising nor edema.  Back" Tender to percusion spinous processes lower thoracic spine.   SKIN: No lesion nor rashes of face/trunk/extremities    Assessment & Plan:

## 2015-10-05 NOTE — Assessment & Plan Note (Signed)
Established problem No finding of nutritional source of the thrombocytopenia. No evidence of spontaneous bruising. No petechiae No common offending medications No splenomegaly on exam. No lyphadenopathy.  Relatively normal other blood cell lines.  No evidence of portal hypertension Surveillance CBC with Diff today.

## 2015-10-05 NOTE — Assessment & Plan Note (Signed)
Established problem worsened.  Suspected worsened with pain medication use.  Increase fruits and vegetables in diet.  Trial of Dulcolax suppositories, prn

## 2015-10-05 NOTE — Telephone Encounter (Signed)
Referral to Dr Erik Obey (ENT) for chronic hearing loss made

## 2015-10-05 NOTE — Patient Instructions (Addendum)
Please go for Xrays of your ribs and back to look for fractures. We are checking your platelet levels today.  Use the Oxycodone 5 mg tablet, take half to one whole tablet every 6 hours if needed for pain.  This medication will constipate.  Lots of fruits and vegetables.  May altenate oxycodone with Tylenol (acetaminophen)  Wear the Rib Belt for comfort.  It will not make your ribs heal faster.

## 2015-10-05 NOTE — Assessment & Plan Note (Signed)
New problem Further workup planned Left Rib detail Xrays pending Rib belt improved pain with testing in exam room.  Wear rib belt as needed for comfort.  Oxycodone 5 mg tab, 1/2 to 1 tab every 6 prn pain alternate with APAP 500 mg RTC 4 weeks

## 2015-10-06 ENCOUNTER — Encounter: Payer: Self-pay | Admitting: Family Medicine

## 2015-10-06 ENCOUNTER — Telehealth: Payer: Self-pay | Admitting: Family Medicine

## 2015-10-06 DIAGNOSIS — M8088XA Other osteoporosis with current pathological fracture, vertebra(e), initial encounter for fracture: Secondary | ICD-10-CM

## 2015-10-06 DIAGNOSIS — I480 Paroxysmal atrial fibrillation: Secondary | ICD-10-CM | POA: Diagnosis not present

## 2015-10-06 DIAGNOSIS — R0781 Pleurodynia: Secondary | ICD-10-CM

## 2015-10-06 DIAGNOSIS — M8088XS Other osteoporosis with current pathological fracture, vertebra(e), sequela: Secondary | ICD-10-CM

## 2015-10-06 DIAGNOSIS — I10 Essential (primary) hypertension: Secondary | ICD-10-CM | POA: Diagnosis not present

## 2015-10-06 DIAGNOSIS — I639 Cerebral infarction, unspecified: Secondary | ICD-10-CM | POA: Diagnosis not present

## 2015-10-06 DIAGNOSIS — M546 Pain in thoracic spine: Secondary | ICD-10-CM

## 2015-10-06 DIAGNOSIS — Z950 Presence of cardiac pacemaker: Secondary | ICD-10-CM | POA: Diagnosis not present

## 2015-10-06 HISTORY — DX: Other osteoporosis with current pathological fracture, vertebra(e), initial encounter for fracture: M80.88XA

## 2015-10-06 LAB — PATHOLOGIST SMEAR REVIEW

## 2015-10-06 NOTE — Telephone Encounter (Signed)
Discussed results of left rib detail Xrays and thoracic spine Xrays and Platelet count with patient's daughter, Raford Pitcher.  Suspect thoracic cage is origin of pain with contusion of the ribs or soft tissue of left lower thoracic cage.  If condition worsens then will consider Chest/abdominal CT. My view of thoracic spine Xray differs from radiology report in that there may be a end plate change of a lower thoracic or upper lumbar vertebrae c/w compression fracture that was present since 2016   Platelets returned to normal range at 155 K.  Unexpected Leukocytosis, WBC 11.7, on CBC from yesterday.  Uncertain origin of this lab abnormality. Patient with nausea without vomiting, taking adequate nutrition. (+) constipation. No weight loss.  Recent esophageal food impaction requiring EGD disimpaction believed secondary to esophageal motility disorder according to pt's dgt.   Assessment 1) Presumed thoracic cage contusion secondary to multifactorial fall 2) Acute Thrombocytopenia - resolved. Uncertain process that resolved spontaneously. Was on Aspirin for pAfib that cardiology stopped b/c of low platelets 3) Mild Leukocytosis with neutrophilia- unexpected finding.  No overt evidence of active inflammatory/malignant process.  Plan 1) Monitor trajectory of thoracic pain 2) repeat CBC at next ov in 4 weeks.  Sooner if patient's condition worsens.

## 2015-10-06 NOTE — Progress Notes (Signed)
Signed and sent Physical Therapy plan of care from [facility].

## 2015-10-07 DIAGNOSIS — I130 Hypertensive heart and chronic kidney disease with heart failure and stage 1 through stage 4 chronic kidney disease, or unspecified chronic kidney disease: Secondary | ICD-10-CM | POA: Diagnosis not present

## 2015-10-07 DIAGNOSIS — M4806 Spinal stenosis, lumbar region: Secondary | ICD-10-CM | POA: Diagnosis not present

## 2015-10-07 DIAGNOSIS — I5032 Chronic diastolic (congestive) heart failure: Secondary | ICD-10-CM | POA: Diagnosis not present

## 2015-10-07 DIAGNOSIS — M6281 Muscle weakness (generalized): Secondary | ICD-10-CM | POA: Diagnosis not present

## 2015-10-07 DIAGNOSIS — M150 Primary generalized (osteo)arthritis: Secondary | ICD-10-CM | POA: Diagnosis not present

## 2015-10-07 DIAGNOSIS — G894 Chronic pain syndrome: Secondary | ICD-10-CM | POA: Diagnosis not present

## 2015-10-08 DIAGNOSIS — I130 Hypertensive heart and chronic kidney disease with heart failure and stage 1 through stage 4 chronic kidney disease, or unspecified chronic kidney disease: Secondary | ICD-10-CM | POA: Diagnosis not present

## 2015-10-08 DIAGNOSIS — M6281 Muscle weakness (generalized): Secondary | ICD-10-CM | POA: Diagnosis not present

## 2015-10-08 DIAGNOSIS — M150 Primary generalized (osteo)arthritis: Secondary | ICD-10-CM | POA: Diagnosis not present

## 2015-10-08 DIAGNOSIS — I5032 Chronic diastolic (congestive) heart failure: Secondary | ICD-10-CM | POA: Diagnosis not present

## 2015-10-08 DIAGNOSIS — G894 Chronic pain syndrome: Secondary | ICD-10-CM | POA: Diagnosis not present

## 2015-10-08 DIAGNOSIS — M4806 Spinal stenosis, lumbar region: Secondary | ICD-10-CM | POA: Diagnosis not present

## 2015-10-13 ENCOUNTER — Encounter: Payer: Self-pay | Admitting: Family Medicine

## 2015-10-13 DIAGNOSIS — I130 Hypertensive heart and chronic kidney disease with heart failure and stage 1 through stage 4 chronic kidney disease, or unspecified chronic kidney disease: Secondary | ICD-10-CM | POA: Diagnosis not present

## 2015-10-13 DIAGNOSIS — M4806 Spinal stenosis, lumbar region: Secondary | ICD-10-CM | POA: Diagnosis not present

## 2015-10-13 DIAGNOSIS — I5032 Chronic diastolic (congestive) heart failure: Secondary | ICD-10-CM | POA: Diagnosis not present

## 2015-10-13 DIAGNOSIS — M6281 Muscle weakness (generalized): Secondary | ICD-10-CM | POA: Diagnosis not present

## 2015-10-13 DIAGNOSIS — G894 Chronic pain syndrome: Secondary | ICD-10-CM | POA: Diagnosis not present

## 2015-10-13 DIAGNOSIS — Z7901 Long term (current) use of anticoagulants: Secondary | ICD-10-CM

## 2015-10-13 DIAGNOSIS — M150 Primary generalized (osteo)arthritis: Secondary | ICD-10-CM | POA: Diagnosis not present

## 2015-10-13 NOTE — Progress Notes (Unsigned)
Patient ID: Jordan [name], female   DOB: Oct 15, 1919, 82 y.o.   MRN: [id number] OV wih Dr Einar Gip (Cardiology) 09/21/15 PMH: S/P Medtronic pacemaker placement 09/24/13 for intermittent complete heart block.   HPI: F/U visit for Afib / Sick Sinus Syndrome after 09/18/15 PM transmission showed three brief episodes of paroxysms of Afib with RVR  And a prolonged atrial fib with RVR for 2.5 hours on 09/16/15  Assessment pAfib w/ CHADS2-VaSc = 6 Current rhythm NSR by EKG in office Normal Pacemaker function check in Person 09/25/15 Patient is pacer dependent Battery Life estimate 13 years  Recommendation - Start Eliquis 2.5 mg twice a day

## 2015-10-15 ENCOUNTER — Telehealth: Payer: Self-pay | Admitting: Family Medicine

## 2015-10-15 DIAGNOSIS — G894 Chronic pain syndrome: Secondary | ICD-10-CM | POA: Diagnosis not present

## 2015-10-15 DIAGNOSIS — M150 Primary generalized (osteo)arthritis: Secondary | ICD-10-CM | POA: Diagnosis not present

## 2015-10-15 DIAGNOSIS — I130 Hypertensive heart and chronic kidney disease with heart failure and stage 1 through stage 4 chronic kidney disease, or unspecified chronic kidney disease: Secondary | ICD-10-CM | POA: Diagnosis not present

## 2015-10-15 DIAGNOSIS — M6281 Muscle weakness (generalized): Secondary | ICD-10-CM | POA: Diagnosis not present

## 2015-10-15 DIAGNOSIS — M4806 Spinal stenosis, lumbar region: Secondary | ICD-10-CM | POA: Diagnosis not present

## 2015-10-15 DIAGNOSIS — I5032 Chronic diastolic (congestive) heart failure: Secondary | ICD-10-CM | POA: Diagnosis not present

## 2015-10-15 NOTE — Telephone Encounter (Signed)
Will forward to MD.  

## 2015-10-15 NOTE — Telephone Encounter (Signed)
[name] called to report Mom is continuing to have pain in abdomen on the left side under her breast.  She would like to talk to Dr McDiarmid about this. She is given her Percocet every day but the pain doesn't go away.

## 2015-10-16 NOTE — Telephone Encounter (Signed)
Please advise that if rib is bruised as Dr Dayami Taitt suspects, then it will take 6 to 8weeks for the pain to resolve.  Continue using the percocet as needed and the rib belt for comfort.

## 2015-10-16 NOTE — Telephone Encounter (Signed)
Spoke with patients daughter and informed her Dr. McDiarmid states her pain will take up to 6-8weeks to resolve. I told pts daughter to inform pt to continue taking her pain pills as prescribed. Pts daughter voiced understanding. Page, cma.

## 2015-10-19 DIAGNOSIS — I130 Hypertensive heart and chronic kidney disease with heart failure and stage 1 through stage 4 chronic kidney disease, or unspecified chronic kidney disease: Secondary | ICD-10-CM | POA: Diagnosis not present

## 2015-10-19 DIAGNOSIS — G894 Chronic pain syndrome: Secondary | ICD-10-CM | POA: Diagnosis not present

## 2015-10-19 DIAGNOSIS — M150 Primary generalized (osteo)arthritis: Secondary | ICD-10-CM | POA: Diagnosis not present

## 2015-10-19 DIAGNOSIS — I5032 Chronic diastolic (congestive) heart failure: Secondary | ICD-10-CM | POA: Diagnosis not present

## 2015-10-19 DIAGNOSIS — M4806 Spinal stenosis, lumbar region: Secondary | ICD-10-CM | POA: Diagnosis not present

## 2015-10-19 DIAGNOSIS — M6281 Muscle weakness (generalized): Secondary | ICD-10-CM | POA: Diagnosis not present

## 2015-10-21 DIAGNOSIS — G894 Chronic pain syndrome: Secondary | ICD-10-CM | POA: Diagnosis not present

## 2015-10-21 DIAGNOSIS — I5032 Chronic diastolic (congestive) heart failure: Secondary | ICD-10-CM | POA: Diagnosis not present

## 2015-10-21 DIAGNOSIS — I130 Hypertensive heart and chronic kidney disease with heart failure and stage 1 through stage 4 chronic kidney disease, or unspecified chronic kidney disease: Secondary | ICD-10-CM | POA: Diagnosis not present

## 2015-10-21 DIAGNOSIS — M6281 Muscle weakness (generalized): Secondary | ICD-10-CM | POA: Diagnosis not present

## 2015-10-21 DIAGNOSIS — M150 Primary generalized (osteo)arthritis: Secondary | ICD-10-CM | POA: Diagnosis not present

## 2015-10-21 DIAGNOSIS — M4806 Spinal stenosis, lumbar region: Secondary | ICD-10-CM | POA: Diagnosis not present

## 2015-10-21 NOTE — Telephone Encounter (Signed)
Jordan [name] thinks mom may have UTI. She would like to come by and get specimen cup and collect the sample from her mom at home. It has been done like this before.  She says it is extremely hard to get her mother to the dr because of the pain she is in. Please contact Jordan [name] at [phone].

## 2015-10-21 NOTE — Telephone Encounter (Signed)
Will forward to MD to give ok for this order before calling daughter to come and get a urine cup. [name],CMA

## 2015-10-26 ENCOUNTER — Other Ambulatory Visit: Payer: Self-pay | Admitting: Gastroenterology

## 2015-10-26 DIAGNOSIS — R1310 Dysphagia, unspecified: Secondary | ICD-10-CM

## 2015-10-26 DIAGNOSIS — R1012 Left upper quadrant pain: Secondary | ICD-10-CM | POA: Diagnosis not present

## 2015-10-26 DIAGNOSIS — R1311 Dysphagia, oral phase: Secondary | ICD-10-CM | POA: Diagnosis not present

## 2015-10-26 NOTE — Telephone Encounter (Signed)
LM for patient with questions from pcp.  Asked her to call back and let us know that symptoms that patient is experiencing.  [name],CMA

## 2015-10-26 NOTE — Telephone Encounter (Signed)
Please assess why the daughter believes her mother has a UTI.  Is there fever, suprapubic pain, new flank pain (not pain that started after her fall), Is there dysuria, new bladder urgency, new change in behavior.

## 2015-10-28 DIAGNOSIS — H903 Sensorineural hearing loss, bilateral: Secondary | ICD-10-CM | POA: Diagnosis not present

## 2015-10-28 DIAGNOSIS — H93293 Other abnormal auditory perceptions, bilateral: Secondary | ICD-10-CM | POA: Diagnosis not present

## 2015-10-28 DIAGNOSIS — H9313 Tinnitus, bilateral: Secondary | ICD-10-CM | POA: Diagnosis not present

## 2015-10-29 DIAGNOSIS — G894 Chronic pain syndrome: Secondary | ICD-10-CM | POA: Diagnosis not present

## 2015-10-29 DIAGNOSIS — I5032 Chronic diastolic (congestive) heart failure: Secondary | ICD-10-CM | POA: Diagnosis not present

## 2015-10-29 DIAGNOSIS — M4806 Spinal stenosis, lumbar region: Secondary | ICD-10-CM | POA: Diagnosis not present

## 2015-10-29 DIAGNOSIS — M150 Primary generalized (osteo)arthritis: Secondary | ICD-10-CM | POA: Diagnosis not present

## 2015-10-29 DIAGNOSIS — I130 Hypertensive heart and chronic kidney disease with heart failure and stage 1 through stage 4 chronic kidney disease, or unspecified chronic kidney disease: Secondary | ICD-10-CM | POA: Diagnosis not present

## 2015-10-29 DIAGNOSIS — M6281 Muscle weakness (generalized): Secondary | ICD-10-CM | POA: Diagnosis not present

## 2015-10-30 ENCOUNTER — Other Ambulatory Visit: Payer: Self-pay | Admitting: Gastroenterology

## 2015-10-30 DIAGNOSIS — R110 Nausea: Secondary | ICD-10-CM

## 2015-10-30 DIAGNOSIS — R1012 Left upper quadrant pain: Secondary | ICD-10-CM

## 2015-11-02 ENCOUNTER — Ambulatory Visit
Admission: RE | Admit: 2015-11-02 | Discharge: 2015-11-02 | Disposition: A | Discharge status: Home / Self Care | Payer: Medicare Other | Source: Ambulatory Visit | Attending: Gastroenterology | Admitting: Gastroenterology

## 2015-11-02 ENCOUNTER — Encounter: Payer: Self-pay | Admitting: Family Medicine

## 2015-11-02 DIAGNOSIS — R1012 Left upper quadrant pain: Secondary | ICD-10-CM

## 2015-11-02 DIAGNOSIS — I480 Paroxysmal atrial fibrillation: Secondary | ICD-10-CM | POA: Diagnosis not present

## 2015-11-02 DIAGNOSIS — K8020 Calculus of gallbladder without cholecystitis without obstruction: Secondary | ICD-10-CM | POA: Diagnosis not present

## 2015-11-02 DIAGNOSIS — R110 Nausea: Secondary | ICD-10-CM

## 2015-11-02 MED ORDER — IOPAMIDOL (ISOVUE-300) INJECTION 61%
100.0000 mL | Freq: Once | INTRAVENOUS | Status: AC | PRN
  Administered 2015-11-02: 100 mL via INTRAVENOUS

## 2015-11-02 NOTE — Progress Notes (Signed)
Patient ID: Jordan [name], female   DOB: 10-18-1919, 82 y.o.   MRN: [id number] Ambulatory Outpatient Follow-up hospitalization for dysphagia with Clarene Essex, MD ([facility]) on 10/26/15  A/  1. Abdominal Pain, Left upper quadrant 2. Oral phase dysphagia  P/  1. CT of Chest and Abdomen with CM 2. If CT unrevealing, then proceed with Barium Swallow.

## 2015-11-04 ENCOUNTER — Other Ambulatory Visit: Payer: Self-pay | Admitting: Gastroenterology

## 2015-11-04 ENCOUNTER — Other Ambulatory Visit: Payer: Self-pay | Admitting: *Deleted

## 2015-11-04 DIAGNOSIS — R1310 Dysphagia, unspecified: Secondary | ICD-10-CM

## 2015-11-04 MED ORDER — DULOXETINE HCL 60 MG PO CPEP: 60.0000 mg | ORAL_CAPSULE | Freq: Every day | ORAL | Status: DC

## 2015-11-10 ENCOUNTER — Ambulatory Visit
Admission: RE | Admit: 2015-11-10 | Discharge: 2015-11-10 | Disposition: A | Discharge status: Home / Self Care | Payer: Medicare Other | Source: Ambulatory Visit | Attending: Gastroenterology | Admitting: Gastroenterology

## 2015-11-10 ENCOUNTER — Other Ambulatory Visit: Payer: Self-pay | Admitting: Physician Assistant

## 2015-11-10 DIAGNOSIS — R1310 Dysphagia, unspecified: Secondary | ICD-10-CM

## 2015-11-10 DIAGNOSIS — K224 Dyskinesia of esophagus: Secondary | ICD-10-CM | POA: Diagnosis not present

## 2015-11-11 ENCOUNTER — Other Ambulatory Visit: Payer: Self-pay

## 2015-11-23 DIAGNOSIS — R1012 Left upper quadrant pain: Secondary | ICD-10-CM | POA: Diagnosis not present

## 2015-11-25 DIAGNOSIS — K2950 Unspecified chronic gastritis without bleeding: Secondary | ICD-10-CM | POA: Diagnosis not present

## 2015-11-25 DIAGNOSIS — K449 Diaphragmatic hernia without obstruction or gangrene: Secondary | ICD-10-CM | POA: Diagnosis not present

## 2015-11-25 DIAGNOSIS — Q399 Congenital malformation of esophagus, unspecified: Secondary | ICD-10-CM | POA: Diagnosis not present

## 2015-11-25 DIAGNOSIS — R1310 Dysphagia, unspecified: Secondary | ICD-10-CM | POA: Diagnosis not present

## 2015-12-01 ENCOUNTER — Telehealth: Payer: Self-pay | Admitting: Family Medicine

## 2015-12-01 NOTE — Telephone Encounter (Signed)
Will forward to MD. Jordan [name],CMA  

## 2015-12-01 NOTE — Telephone Encounter (Signed)
Pt's daughter called, stated her mom had a seizure last week and has had pain in her ribs on the left side. Has seen Dr. Watt Climes (gastro) and he said the pain in her ribs is not related to any issues in the abdomen and recommended pain management, ortho, or her PCP to give her an injection for the pain. Pt is on blood thinners and the daughter called them and they said it is ok for her to have the injection. Pt's daughter wants to know who Dr. McDiarmid  recommends for injection. Here with him, [facility] or a pain management clinic and if he recommends somewhere else, would he please write a referral? Pt's daughter is also faxing in results of X-rays, u/s, ct today. She also has images on disc if the dr wants to see them. ep

## 2015-12-10 DIAGNOSIS — M546 Pain in thoracic spine: Secondary | ICD-10-CM | POA: Diagnosis not present

## 2015-12-10 DIAGNOSIS — M545 Low back pain: Secondary | ICD-10-CM | POA: Diagnosis not present

## 2015-12-16 DIAGNOSIS — Z950 Presence of cardiac pacemaker: Secondary | ICD-10-CM | POA: Diagnosis not present

## 2015-12-19 IMAGING — CR DG ABDOMEN 2V
2 series · 2 of 2 positions shown · non-contrast
Comparison: Previous imaging examinations.

CLINICAL DATA: Diarrhea for the past 5 days. Non localized
abdominal pain.

EXAM:
ABDOMEN - 2 VIEW

[w abdomen upright]
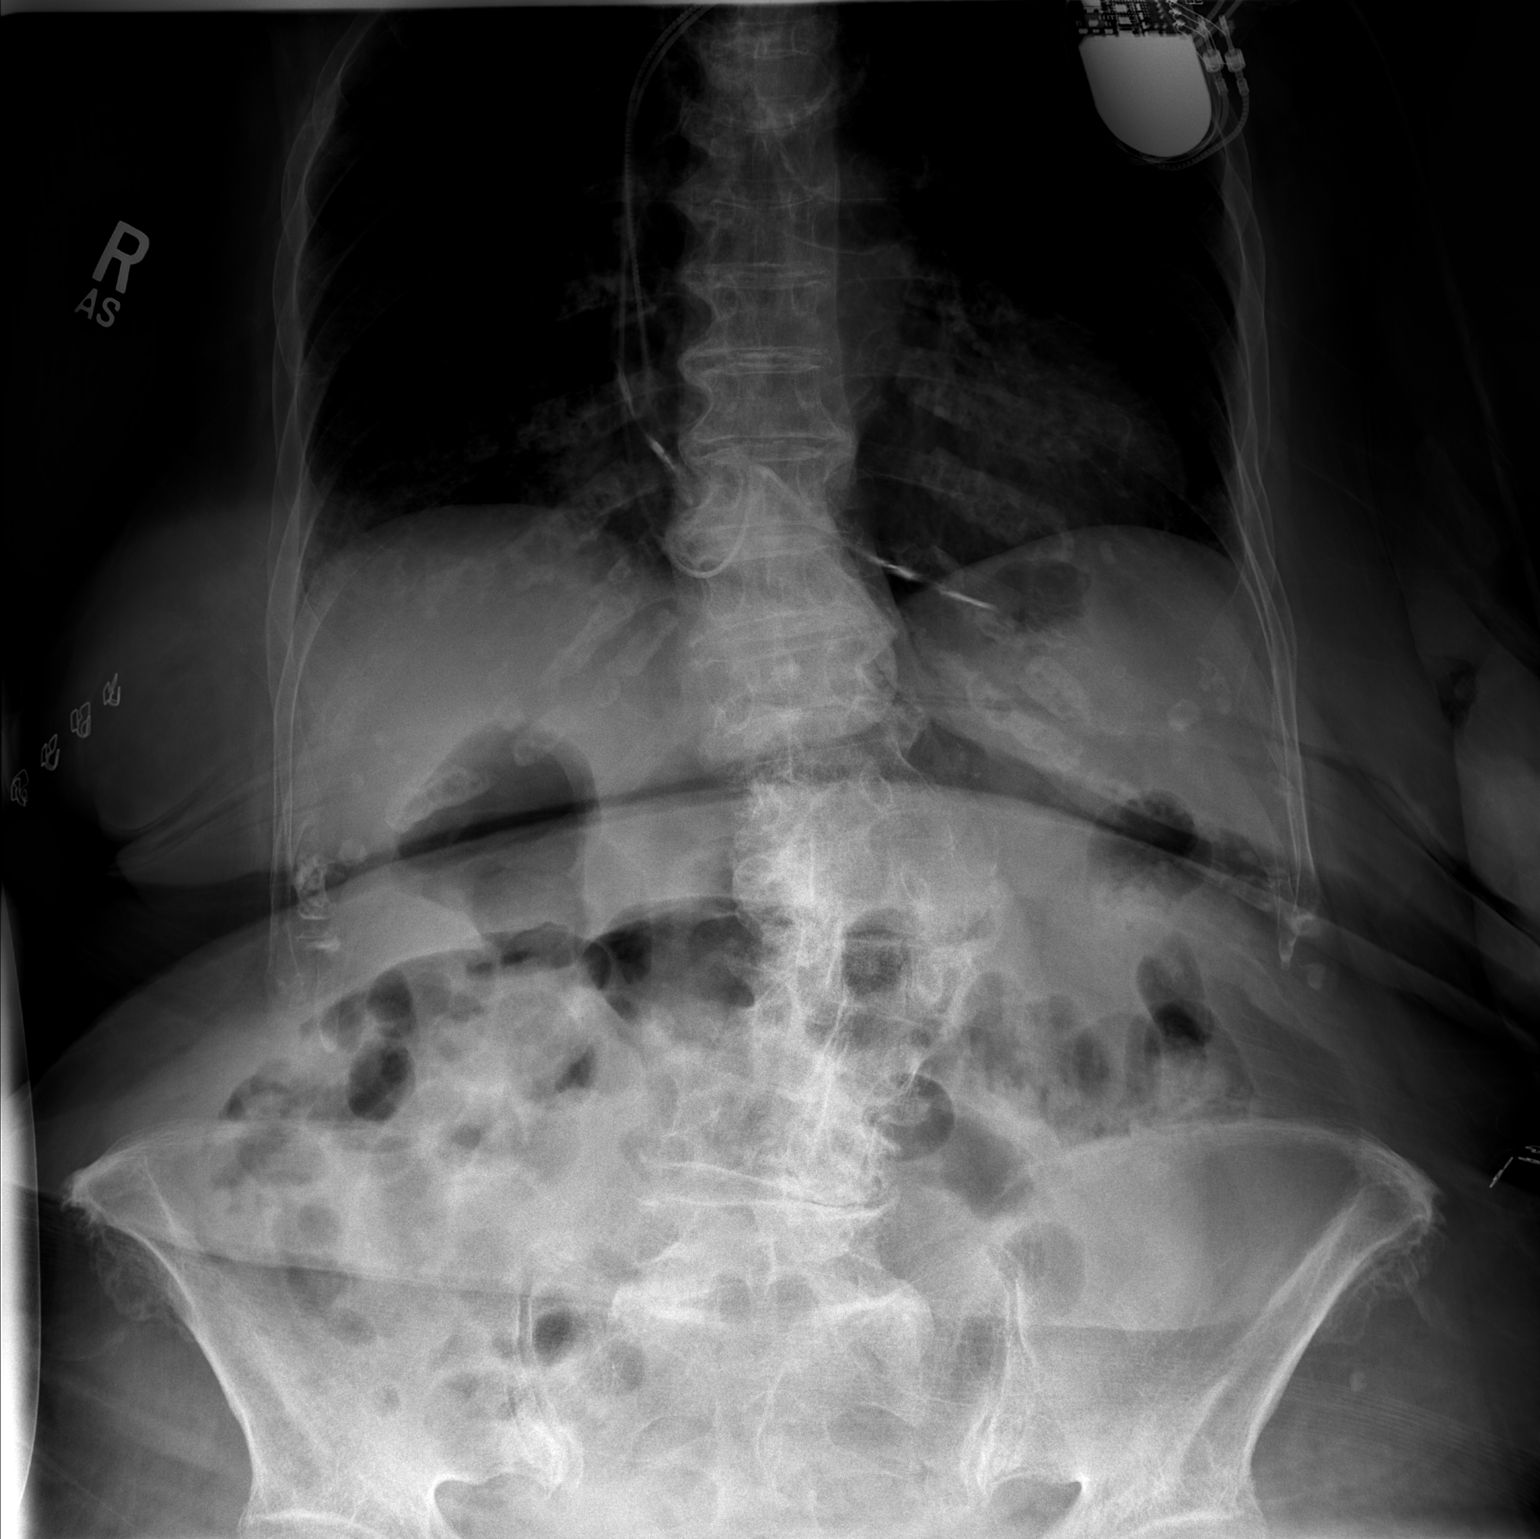

[t abdomen supine]
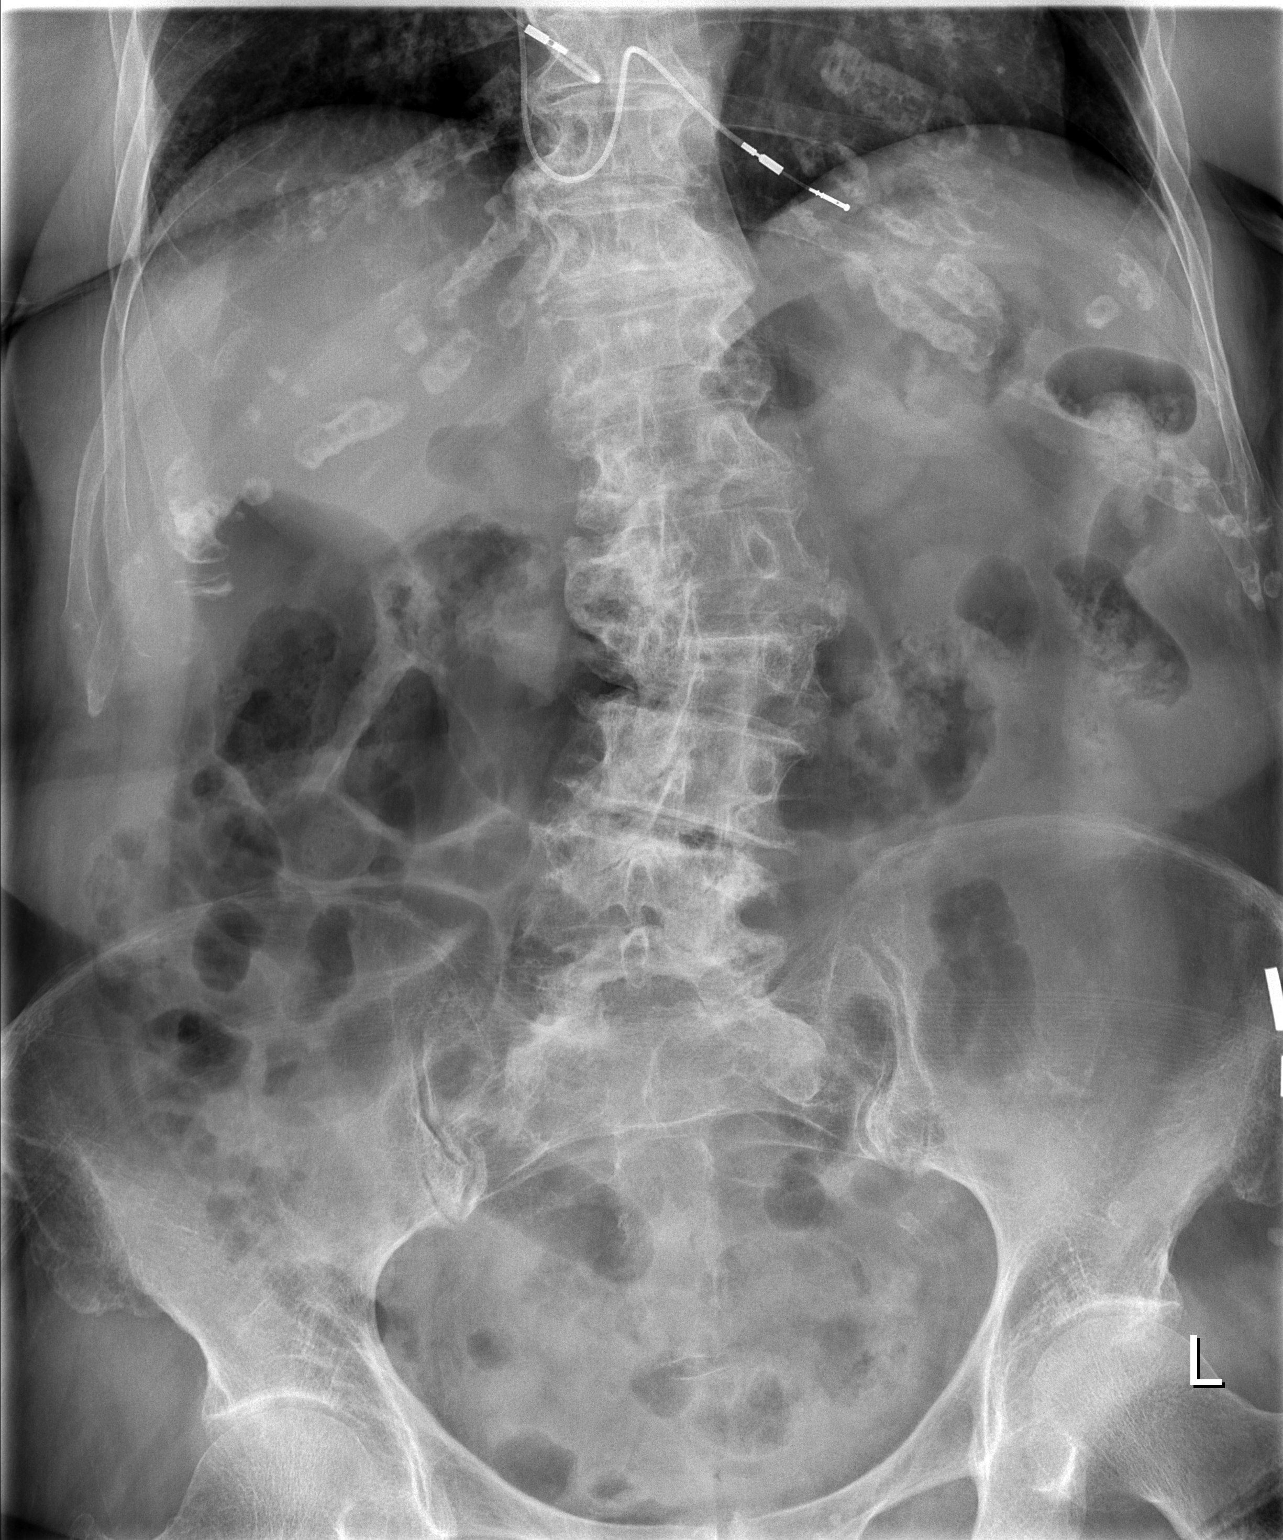

[2 of 2 positions shown; findings below may reference images not displayed]

FINDINGS: Normal bowel gas pattern without free peritoneal air. Extensive
lumbar and thoracic spine degenerative changes. Mild to moderate
levoconvex lumbar rotary scoliosis. L3 vertebral compression
deformity with no visible acute fracture lines. Bipolar transvenous
pacemaker.
IMPRESSION: No acute abnormality.

## 2015-12-29 ENCOUNTER — Telehealth: Payer: Self-pay | Admitting: Family Medicine

## 2015-12-29 NOTE — Telephone Encounter (Signed)
Will check with MD to see when we could move patient around or schedule her on geri possibly.  [name],CMA

## 2015-12-29 NOTE — Telephone Encounter (Signed)
Jordan [name] has requested Dr McDiarmid work in her mother on his next day in clinic in Sept. Her appt this week conflicts with other appts on this day. Please advise

## 2015-12-31 ENCOUNTER — Ambulatory Visit: Payer: Self-pay | Admitting: Family Medicine

## 2015-12-31 NOTE — Telephone Encounter (Signed)
I can see Ms Bruff 8:30 AM next Thursday, 7/7, but please warn the daughter know that the visit will have to be a short one.  Place patient on Same Day Appointment 7/7 at 8:30 am and add note to have Dr Tanav Orsak paged to see patient.

## 2016-01-01 NOTE — Telephone Encounter (Signed)
LMOVM for Jordan [name] to return call.   When she does please schedule Jordan [name] on Dr. Lebron Conners [facility] (01/07/16 @ 8:30) and notate to page Dr. McDiarmid when they arrive.   See below message. Priyana Mccarey, [name], CMA

## 2016-01-05 NOTE — Telephone Encounter (Signed)
Appointment made for patient. Jeylin Woodmansee,CMA

## 2016-01-07 ENCOUNTER — Ambulatory Visit: Payer: Self-pay | Admitting: Student

## 2016-01-22 ENCOUNTER — Other Ambulatory Visit: Payer: Self-pay | Admitting: Family Medicine

## 2016-02-04 ENCOUNTER — Ambulatory Visit (INDEPENDENT_AMBULATORY_CARE_PROVIDER_SITE_OTHER): Payer: Medicare Other | Admitting: Family Medicine

## 2016-02-04 ENCOUNTER — Encounter: Payer: Self-pay | Admitting: Family Medicine

## 2016-02-04 VITALS — BP 133/66 | HR 68 | Temp 97.7°F | Wt 137.0 lb

## 2016-02-04 DIAGNOSIS — G8929 Other chronic pain: Secondary | ICD-10-CM

## 2016-02-04 DIAGNOSIS — Z79899 Other long term (current) drug therapy: Secondary | ICD-10-CM | POA: Diagnosis not present

## 2016-02-04 DIAGNOSIS — Z7901 Long term (current) use of anticoagulants: Secondary | ICD-10-CM

## 2016-02-04 DIAGNOSIS — M546 Pain in thoracic spine: Secondary | ICD-10-CM | POA: Diagnosis present

## 2016-02-04 DIAGNOSIS — N183 Chronic kidney disease, stage 3 unspecified: Secondary | ICD-10-CM

## 2016-02-04 DIAGNOSIS — M48061 Spinal stenosis, lumbar region without neurogenic claudication: Secondary | ICD-10-CM | POA: Diagnosis not present

## 2016-02-04 DIAGNOSIS — E7800 Pure hypercholesterolemia, unspecified: Secondary | ICD-10-CM | POA: Diagnosis not present

## 2016-02-04 DIAGNOSIS — Z23 Encounter for immunization: Secondary | ICD-10-CM

## 2016-02-04 DIAGNOSIS — Z7409 Other reduced mobility: Secondary | ICD-10-CM

## 2016-02-04 MED ORDER — OXYCODONE HCL 5 MG PO TABS: 2.5000 mg | ORAL_TABLET | Freq: Four times a day (QID) | ORAL | 0 refills | Status: DC | PRN

## 2016-02-04 NOTE — Patient Instructions (Signed)
Take one half oxycodone pill every six hours as needed for pain.  Watch for constipation and confusion.   Dr Girtie Wiersma will call you if your tests are not good. Otherwise he will send you a letter.  If you sign up for MyChart online, you will be able to see your test results once Dr Guhan Bruington has reviewed them.  If you do not hear from [place] with in 2 weeks please call our office

## 2016-02-05 ENCOUNTER — Encounter: Payer: Self-pay | Admitting: Family Medicine

## 2016-02-05 LAB — CBC WITH DIFFERENTIAL/PLATELET
BASOS PCT: 0 %
Basophils Absolute: 0 cells/uL (ref 0–200)
EOS PCT: 1 %
Eosinophils Absolute: 90 cells/uL (ref 15–500)
HCT: 36 % (ref 35.0–45.0)
Hemoglobin: 11.8 g/dL (ref 11.7–15.5)
Lymphocytes Relative: 23 %
Lymphs Abs: 2070 cells/uL (ref 850–3900)
MCH: 31.6 pg (ref 27.0–33.0)
MCHC: 32.8 g/dL (ref 32.0–36.0)
MCV: 96.3 fL (ref 80.0–100.0)
MONOS PCT: 7 %
MPV: 14.3 fL — AB (ref 7.5–12.5)
Monocytes Absolute: 630 cells/uL (ref 200–950)
NEUTROS ABS: 6210 {cells}/uL (ref 1500–7800)
Neutrophils Relative %: 69 %
PLATELETS: 62 10*3/uL — AB (ref 140–400)
RBC: 3.74 MIL/uL — AB (ref 3.80–5.10)
RDW: 13.3 % (ref 11.0–15.0)
WBC: 9 10*3/uL (ref 3.8–10.8)

## 2016-02-05 LAB — LIPID PANEL
CHOL/HDL RATIO: 3.5 ratio (ref <=5.0)
CHOLESTEROL: 230 mg/dL — AB (ref 125–200)
HDL: 66 mg/dL (ref >=46)
LDL Cholesterol: 123 mg/dL (ref <130)
Triglycerides: 204 mg/dL — ABNORMAL HIGH (ref <150)
VLDL: 41 mg/dL — ABNORMAL HIGH (ref <30)

## 2016-02-05 LAB — BASIC METABOLIC PANEL
BUN: 23 mg/dL (ref 7–25)
CALCIUM: 9.2 mg/dL (ref 8.6–10.4)
CO2: 30 mmol/L (ref 20–31)
CREATININE: 1.08 mg/dL — AB (ref 0.60–0.88)
Chloride: 99 mmol/L (ref 98–110)
Glucose, Bld: 85 mg/dL (ref 65–99)
Potassium: 4.5 mmol/L (ref 3.5–5.3)
Sodium: 138 mmol/L (ref 135–146)

## 2016-02-05 NOTE — Assessment & Plan Note (Signed)
Established problem Uncontrollled Recommended trial of oxycodone 5 mg tablet, 2.5 mg (1/2 tablet) every 6 hours as needed. Discussed watching for constipation, sedation, and confusion.  Pt unable to have spinal injections by Dr Jacelyn Grip bc she is unable to stop eliquis per Dr Einar Gip (Card)

## 2016-02-05 NOTE — Progress Notes (Signed)
   Subjective:    Patient ID: Jordan [name], female    DOB: March 11, 1920, 82 y.o.   MRN: [id number] Jordan [name] is accompanied by son and daughter Sources of clinical information for visit is/are patient, relative(s) and past medical records. Nursing assessment for this office visit was reviewed with the patient for accuracy and revision.  [name] interpreted Spanish-English for visit.  Family declined interpreter.  HPI Problem List Items Addressed This Visit      High   Anticoagulated by anticoagulation treatment - pt started on eliquis by Dr Christen Butter (Card) for atrial fibrillation - No epistaxis, no melenna, no BRBPR - no chest pian, no orthopnea     Unprioritized      Impaired mobility and ADLs - Longstanding isse - prgogressely worsening gradually - Needs assistance dressing, uses walker to ambulate, needs safety supervision for bathing - able to feed self, able to choose own clothing appropriately. Able to use toilet without assistance.    Hyperlipidemia - longstanding - No RUQ pain, no jaundice    Relevant Orders   Lipid panel (Completed)   Chronic bilateral thoracic back pain - Onset after fall out of stationary car onto thoracic side. - no areas of overt tissue injury. - Patient has seen orthopedist who referred pt to Dr Jacelyn Grip ([facility]) for spinal injection.  Unfortunately, patient is unable to come off Eliquis for the injections.  - Pain reducing quality of patient's life per pt and family.  She is taking APAP.  She may not take NSAID because of advanced CKD.    Relevant Medications   oxyCODONE (OXY IR/ROXICODONE) 5 MG immediate release tablet    Other Visit Diagnoses    High risk medication use    -  Primary   Relevant Orders   Basic Metabolic Panel (Completed)   CBC with Differential (Completed)   Encounter for immunization       Relevant Orders   Flu Vaccine QUAD 36+ mos IM (Completed)     Medication list updated from list kept by dgt.  No smoking     Review of  Systems No DOE No rash    Objective:   Physical Exam  VS reviewed GEN: Alert, Cooperative, Groomed, Moving frequently in [facility] apparently trying to find a more comfortable position, frequent sighing HEENT: PERRL; EAC bilaterally not occluded, TM's translucent with normal LM, (+) LR;                No cervical LAN, No thyromegaly, No palpable masses COR: RRR, No M/G/R, No JVD, Normal PMI size and location LUNGS: Bibasilar cracles, no wheezing, no acc mm use.  ABDOMEN: (+)BS, soft, NT, ND EXT: No peripheral leg edema Neuro: Oriented to person, place, and time; Strength: 5/5 Bil. UE and LE symmetric; Sensation: Intact grossly to touch all four extremities; Cerebellar: Finger-to-Nose intact, Rhomberg negative Psych: engages with interviewer in [place].  Pleasant in interactions.  Follows one step requests.     Assessment & Plan:

## 2016-02-05 NOTE — Assessment & Plan Note (Signed)
Basic Metabolic Panel:    Component Value Date/Time   NA 138 02/04/2016 1218   K 4.5 02/04/2016 1218   CL 99 02/04/2016 1218   CO2 30 02/04/2016 1218   BUN 23 02/04/2016 1218   CREATININE 1.08 (H) 02/04/2016 1218   GLUCOSE 85 02/04/2016 1218   CALCIUM 9.2 02/04/2016 1218  Stable Cr. Low MDRD driven mostly by age.

## 2016-02-08 ENCOUNTER — Telehealth: Payer: Self-pay | Admitting: Family Medicine

## 2016-02-08 NOTE — Telephone Encounter (Signed)
Daughter would like someone to call her regarding pt's labs. Please advise. Thanks! ep

## 2016-02-09 NOTE — Telephone Encounter (Signed)
I believe I released the results to patient's MyChart.  Please direct her to there.  If she cannot access the labs, let us know and I will send letter with labs

## 2016-02-10 NOTE — Telephone Encounter (Signed)
Daughter is aware of results and would like [place] to fax a copy to her at [phone] and Dr. Mila Palmer office at [phone].  Daughter states that she is not sure if she evens remembers the login for mychart but will check on this. Jordan [name],CMA

## 2016-02-15 DIAGNOSIS — Z950 Presence of cardiac pacemaker: Secondary | ICD-10-CM | POA: Diagnosis not present

## 2016-02-15 DIAGNOSIS — I480 Paroxysmal atrial fibrillation: Secondary | ICD-10-CM | POA: Diagnosis not present

## 2016-02-15 DIAGNOSIS — I10 Essential (primary) hypertension: Secondary | ICD-10-CM | POA: Diagnosis not present

## 2016-02-15 DIAGNOSIS — Z8673 Personal history of transient ischemic attack (TIA), and cerebral infarction without residual deficits: Secondary | ICD-10-CM | POA: Diagnosis not present

## 2016-02-21 DIAGNOSIS — Z950 Presence of cardiac pacemaker: Secondary | ICD-10-CM | POA: Diagnosis not present

## 2016-02-28 ENCOUNTER — Other Ambulatory Visit: Payer: Self-pay | Admitting: Family Medicine

## 2016-03-14 ENCOUNTER — Encounter: Payer: Self-pay | Admitting: Family Medicine

## 2016-03-14 NOTE — Progress Notes (Signed)
02/15/16 Office Visit Note from Laverda Page, MD [facility] Cardi[facility])  A/ 1. pAfib - Onset 10/05/15 - episodic transmission 10/05/15 - CHADS2-VASc = 6 - 02/05/16 EKG Sinus rhythm with VP rate 75 bpm  2. Cardiac Pacer in situ - Medtronic ADDR1 dual chamber Remote Pacemenaker transmission 11/30/15: normal functioning of pacer.  - Patient is pacer dependent with underlying heart rate ot 60 bpm. - Hx of Complete Heart Block - Battery Life 11.5 yrs  P/ - maintained sinus rhythm - controlled bp on losartan - appropriate anticoagulation - RTC 6 months F/U afib and htn

## 2016-03-17 ENCOUNTER — Ambulatory Visit (INDEPENDENT_AMBULATORY_CARE_PROVIDER_SITE_OTHER): Payer: Medicare Other | Admitting: Family Medicine

## 2016-03-17 ENCOUNTER — Encounter: Payer: Self-pay | Admitting: Family Medicine

## 2016-03-17 VITALS — BP 142/50 | HR 78 | Temp 98.4°F | Wt 138.6 lb

## 2016-03-17 DIAGNOSIS — R29898 Other symptoms and signs involving the musculoskeletal system: Secondary | ICD-10-CM

## 2016-03-17 DIAGNOSIS — W06XXXA Fall from bed, initial encounter: Secondary | ICD-10-CM

## 2016-03-17 DIAGNOSIS — G8929 Other chronic pain: Secondary | ICD-10-CM | POA: Diagnosis not present

## 2016-03-17 DIAGNOSIS — M546 Pain in thoracic spine: Secondary | ICD-10-CM | POA: Diagnosis not present

## 2016-03-17 HISTORY — DX: Fall from bed, initial encounter: W06.XXXA

## 2016-03-17 HISTORY — DX: Other symptoms and signs involving the musculoskeletal system: R29.898

## 2016-03-17 NOTE — Progress Notes (Signed)
Subjective:    Patient ID: Jordan [name], female    DOB: 21-Jun-1919, 82 y.o.   MRN: [id number] SOPHIAGRACE BENBROOK is accompanied by son Sources of clinical information for visit is/are patient, relative(s) and past medical records. Son and pt declined Spanish-English interpreter. He acted as Astronomer. Nursing assessment for this office visit was reviewed with the patient for accuracy and revision.   HPI  FALL  Location: in bedroom,  Context: unwitnessed but heard by her son in next room who attended her immediately Onset: last week Activity prior to fall: sitting on bed edge, browsing thru bedside table drawer. Bed about 18 inches off floor.  Change in position prior to fall: yes, leaning over drawer Dizziness prior to fall:  no Syncope prior to fall:  no Vertigo:  no Chest Pain/Palpitations:  no Loss of balance:   yes Slip: no Trip: no Slide:  no What Body parts struck object(s) or ground: vertex of head What object or surface was struck: vertical surface of table cabinet Injury: yes, laceration scalp vertex.  Bleeding stopped with Ice and compression Associated acute Illness no Medications (new or dose changes):  yes, oxycodone, but has not taken it for weveral weeks Prior Work-up of fall or fall complications: no No headache, no new arm or leg pain, no new neck pain Worry about falling:  no   Vision Difficulty:  yes Independent in ADLs: no  Independent in I-ADLs: no  Hx of falls evaluation: yes Hx of orthostatic dizziness: no Hx of problems with legs (strength, pain):  Yes  Chronic bilateral thoracic back pain - Onset after falling out of standstill car.  Pt landed of left side of thorax - Pt unable to have spinal injection because of Apixaban therapy. - pain fairly controlled with APAP prn. - Oxycodone 2.5 mg makes patient somewhat confused after several doses.    Right hand heaviness - onset several weeks ago - patient is right-handed - pattern: in morning -Relief:  spontaneously - Course: stable - No pain in hand. Denies pins and needles sensation. No weakness in hand - PMH: Carpal tunnel surgery right hand   Social history: No smoking Lives with her son and dgt and has PCS several days a week.  Her children are very attentive to her needs and safety.     Review of Systems No fever No loss of appetite No clumsiness No vomiting    Objective:   Physical Exam VS reviewed GEN: Alert, Cooperative, Groomed, NAD, witting in WC HEENT: PERRL; EAC bilaterally not occluded, TM's translucent with normal LM, (+) LR;                No cervical LAN, No thyromegaly, No palpable masses, no hemotympanum, no Battle sign.   Vertex scalp with ecchymosis ~ 3 cm circumference with central linear epidermal deficit with covering of what appears to be dried blood COR: RRR, No M/G/R, No JVD, Normal PMI size and location LUNGS: BCTA, No Acc mm use, speaking in full sentences in Spanish  EXT: No pain with internal rotation of hips, right wrist carpal tunnel surgical scar, right thenar muscle atrophy, squaring of CMC joint,  Right knee with medial boney hypertrophy of medial femoral condyle SKIN: No lesion nor rashes of face/trunk/extremities Neuro: Oriented to person, place, and time; Strength: 5/5 Bil. UE and LE symmetric; erebellar: Finger-to-Nose intact, no arm drift Psych: Normal cadence speech.  Appears fluent and quickly interpretable by son    Assessment & Plan:  See  problem list

## 2016-03-17 NOTE — Assessment & Plan Note (Signed)
New problem Loss of balance from seated position while torso extended forward of center of gravity Minor head trauma with scalp laceration healing by secondary intent.  No evidence of superinfection. No neurologic focality on exam. No furhter work up for Discussed keeping bed low to ground, mats to bedside and portable bed railings to provide support when seated at bedside.

## 2016-03-17 NOTE — Assessment & Plan Note (Signed)
Established problem that has improved.  Fair pain control with prn APAP less than 3000 mg daily Oxycodone 5 mg tab, 1/2 tab (2.5 mg) was helpful for moderate pain but after several doses in a row, family noted that patient appeared confused.  Plan to continue first line APAP prn with prn Oxycodone 2.5 mg prn exacerbations of pain with caveat to avoid several doses of oxycodone in a row.

## 2016-03-17 NOTE — Assessment & Plan Note (Signed)
New problem Suspect transient compressive nocturnal obdormition at median and/or ulnar nerves If problematic, recommended braces to limit flexion at wrist or elbows.

## 2016-03-23 ENCOUNTER — Telehealth: Payer: Self-pay | Admitting: Family Medicine

## 2016-03-23 NOTE — Telephone Encounter (Signed)
Pt is dizzy. She fainted in the bathroom/. The home health aide caught her so she wouldn't hit her head.  Her BP was 207/97.  It has come down to 167/97. Please advise

## 2016-03-23 NOTE — Telephone Encounter (Signed)
BP is 156/72 now and patient is still complaining of dizziness. She is not nauseated or having any vision blurriness.  Daughter advised to keep watch on patient's BP and also her symptoms.  Advised her to seek care in the urgent care tonight to evaluate her blood pressure and dizziness but she will just watch her overnight.  She will take her to the ED/ UC if bp continues to rise or symptoms worsen.  She asked if there was anything she could give her OTC but informed her that she should be evaluated if she wants medication for treatment. [name],CMA

## 2016-03-24 ENCOUNTER — Emergency Department (HOSPITAL_COMMUNITY): Payer: Medicare Other

## 2016-03-24 ENCOUNTER — Telehealth: Payer: Self-pay | Admitting: Family Medicine

## 2016-03-24 ENCOUNTER — Other Ambulatory Visit: Payer: Self-pay

## 2016-03-24 ENCOUNTER — Encounter (HOSPITAL_COMMUNITY): Payer: Self-pay

## 2016-03-24 ENCOUNTER — Emergency Department (HOSPITAL_COMMUNITY)
Admission: EM | Admit: 2016-03-24 | Discharge: 2016-03-24 | Disposition: A | Discharge status: Home / Self Care | Payer: Medicare Other | Attending: Emergency Medicine | Admitting: Emergency Medicine

## 2016-03-24 DIAGNOSIS — I671 Cerebral aneurysm, nonruptured: Secondary | ICD-10-CM | POA: Diagnosis not present

## 2016-03-24 DIAGNOSIS — N183 Chronic kidney disease, stage 3 (moderate): Secondary | ICD-10-CM | POA: Insufficient documentation

## 2016-03-24 DIAGNOSIS — Z79899 Other long term (current) drug therapy: Secondary | ICD-10-CM | POA: Diagnosis not present

## 2016-03-24 DIAGNOSIS — R42 Dizziness and giddiness: Secondary | ICD-10-CM | POA: Diagnosis not present

## 2016-03-24 DIAGNOSIS — G308 Other Alzheimer's disease: Secondary | ICD-10-CM | POA: Diagnosis not present

## 2016-03-24 DIAGNOSIS — I130 Hypertensive heart and chronic kidney disease with heart failure and stage 1 through stage 4 chronic kidney disease, or unspecified chronic kidney disease: Secondary | ICD-10-CM | POA: Insufficient documentation

## 2016-03-24 DIAGNOSIS — Z950 Presence of cardiac pacemaker: Secondary | ICD-10-CM | POA: Insufficient documentation

## 2016-03-24 DIAGNOSIS — I5032 Chronic diastolic (congestive) heart failure: Secondary | ICD-10-CM | POA: Diagnosis not present

## 2016-03-24 DIAGNOSIS — Z7901 Long term (current) use of anticoagulants: Secondary | ICD-10-CM | POA: Diagnosis not present

## 2016-03-24 DIAGNOSIS — F0280 Dementia in other diseases classified elsewhere without behavioral disturbance: Secondary | ICD-10-CM | POA: Insufficient documentation

## 2016-03-24 DIAGNOSIS — E039 Hypothyroidism, unspecified: Secondary | ICD-10-CM | POA: Diagnosis not present

## 2016-03-24 DIAGNOSIS — R404 Transient alteration of awareness: Secondary | ICD-10-CM | POA: Diagnosis not present

## 2016-03-24 DIAGNOSIS — H81399 Other peripheral vertigo, unspecified ear: Secondary | ICD-10-CM | POA: Insufficient documentation

## 2016-03-24 DIAGNOSIS — R51 Headache: Secondary | ICD-10-CM | POA: Diagnosis not present

## 2016-03-24 LAB — URINALYSIS, ROUTINE W REFLEX MICROSCOPIC
BILIRUBIN URINE: NEGATIVE
Glucose, UA: NEGATIVE mg/dL
Ketones, ur: NEGATIVE mg/dL
NITRITE: NEGATIVE
PROTEIN: 100 mg/dL — AB
SPECIFIC GRAVITY, URINE: 1.008 (ref 1.005–1.030)
pH: 7 (ref 5.0–8.0)

## 2016-03-24 LAB — CBC
HEMATOCRIT: 36.7 % (ref 36.0–46.0)
HEMOGLOBIN: 12.1 g/dL (ref 12.0–15.0)
MCH: 32.4 pg (ref 26.0–34.0)
MCHC: 33 g/dL (ref 30.0–36.0)
MCV: 98.1 fL (ref 78.0–100.0)
Platelets: 140 10*3/uL — ABNORMAL LOW (ref 150–400)
RBC: 3.74 MIL/uL — ABNORMAL LOW (ref 3.87–5.11)
RDW: 12.5 % (ref 11.5–15.5)
WBC: 9.4 10*3/uL (ref 4.0–10.5)

## 2016-03-24 LAB — DIFFERENTIAL
BASOS PCT: 0 %
Basophils Absolute: 0 10*3/uL (ref 0.0–0.1)
Eosinophils Absolute: 0 10*3/uL (ref 0.0–0.7)
Eosinophils Relative: 0 %
LYMPHS ABS: 1 10*3/uL (ref 0.7–4.0)
LYMPHS PCT: 10 %
MONO ABS: 0.5 10*3/uL (ref 0.1–1.0)
MONOS PCT: 5 %
NEUTROS ABS: 7.9 10*3/uL — AB (ref 1.7–7.7)
Neutrophils Relative %: 85 %

## 2016-03-24 LAB — I-STAT CHEM 8, ED
BUN: 26 mg/dL — ABNORMAL HIGH (ref 6–20)
CALCIUM ION: 1.19 mmol/L (ref 1.15–1.40)
CHLORIDE: 99 mmol/L — AB (ref 101–111)
Creatinine, Ser: 1.1 mg/dL — ABNORMAL HIGH (ref 0.44–1.00)
GLUCOSE: 111 mg/dL — AB (ref 65–99)
HCT: 39 % (ref 36.0–46.0)
Hemoglobin: 13.3 g/dL (ref 12.0–15.0)
Potassium: 4.6 mmol/L (ref 3.5–5.1)
Sodium: 137 mmol/L (ref 135–145)
TCO2: 32 mmol/L (ref 0–100)

## 2016-03-24 LAB — RAPID URINE DRUG SCREEN, HOSP PERFORMED
Amphetamines: NOT DETECTED
BARBITURATES: NOT DETECTED
Benzodiazepines: NOT DETECTED
Cocaine: NOT DETECTED
Opiates: NOT DETECTED
Tetrahydrocannabinol: NOT DETECTED

## 2016-03-24 LAB — PROTIME-INR
INR: 1.06
Prothrombin Time: 13.8 seconds (ref 11.4–15.2)

## 2016-03-24 LAB — URINE MICROSCOPIC-ADD ON

## 2016-03-24 LAB — I-STAT TROPONIN, ED: TROPONIN I, POC: 0.02 ng/mL (ref 0.00–0.08)

## 2016-03-24 LAB — ETHANOL

## 2016-03-24 LAB — COMPREHENSIVE METABOLIC PANEL
ALK PHOS: 96 U/L (ref 38–126)
ALT: 13 U/L — AB (ref 14–54)
AST: 19 U/L (ref 15–41)
Albumin: 3.1 g/dL — ABNORMAL LOW (ref 3.5–5.0)
Anion gap: 7 (ref 5–15)
BILIRUBIN TOTAL: 0.4 mg/dL (ref 0.3–1.2)
BUN: 21 mg/dL — AB (ref 6–20)
CALCIUM: 9.1 mg/dL (ref 8.9–10.3)
CO2: 29 mmol/L (ref 22–32)
CREATININE: 1.19 mg/dL — AB (ref 0.44–1.00)
Chloride: 98 mmol/L — ABNORMAL LOW (ref 101–111)
GFR, EST AFRICAN AMERICAN: 44 mL/min — AB (ref >60)
GFR, EST NON AFRICAN AMERICAN: 38 mL/min — AB (ref >60)
Glucose, Bld: 118 mg/dL — ABNORMAL HIGH (ref 65–99)
Potassium: 4.3 mmol/L (ref 3.5–5.1)
Sodium: 134 mmol/L — ABNORMAL LOW (ref 135–145)
TOTAL PROTEIN: 6.9 g/dL (ref 6.5–8.1)

## 2016-03-24 LAB — APTT: aPTT: 33 seconds (ref 24–36)

## 2016-03-24 MED ORDER — MECLIZINE HCL 25 MG PO TABS: 25.0000 mg | ORAL_TABLET | Freq: Three times a day (TID) | ORAL | 0 refills | Status: DC | PRN

## 2016-03-24 MED ORDER — MECLIZINE HCL 25 MG PO TABS
25.0000 mg | ORAL_TABLET | Freq: Once | ORAL | Status: AC
  Administered 2016-03-24: 25 mg via ORAL
  Filled 2016-03-24: qty 1

## 2016-03-24 MED ORDER — IOPAMIDOL (ISOVUE-370) INJECTION 76%
INTRAVENOUS | Status: AC
  Administered 2016-03-24: 50 mL
  Filled 2016-03-24: qty 50

## 2016-03-24 MED ORDER — ONDANSETRON 4 MG PO TBDP: 4.0000 mg | ORAL_TABLET | Freq: Three times a day (TID) | ORAL | 0 refills | Status: DC | PRN

## 2016-03-24 NOTE — ED Notes (Signed)
Pt. Ambulated with walker with wheels 20 feet. Unsteady; states she "feels like she is falling towards the left."

## 2016-03-24 NOTE — Telephone Encounter (Signed)
   Answering service call. BP 031 systolic and pt dizzy. Seen by Dr. Einar Gip yest for pacermaker interogation and was instructed to double up on losartan and continue metoprolol which she took again today. Family has called EMS already.  Advised that we would be delighted to see pt in the office Fri (tomorrow) or Sat if her bp comes down and sxs resolve after EMS eval. If it does not -> needs to go to ER.

## 2016-03-24 NOTE — ED Triage Notes (Signed)
Pt presents via EMS for evaluation of dizziness x 2 days with nausea. Pt. Denies vomiting. Pt. Called PCP yesterday regarding high BP and dizziness. Pt. States nausea began this AM. Pt. Had near syncopal episode yesterday. Pt. Has medtronic pacemaker, EMS noted HTN 202/87

## 2016-03-24 NOTE — ED Provider Notes (Signed)
[facility] DEPT Provider Note   CSN: [id number] Arrival date & time: 03/24/16  1054     History   Chief Complaint Chief Complaint  Patient presents with  . Dizziness  . Nausea    HPI Jordan [name] is a 82 y.o. female.  HPI  82 year old female presents with dizziness since yesterday. History is taken from the daughter as she is interpreting. The patient walked like normal in her apartment with her walker. She stopped to look in the mirror and then all of a sudden had room spinning sensation. In total lasted about 30 minutes. Has had an occipital headache/fullness since then. Took her blood pressure and was 194 systolic. Called cardiology and was told to take an extra losartan. This morning the dizziness returned. Does not really feel like she's on to pass out. No chest pain, shortness breath, or neck pain. No blurry vision. Has had nausea without vomiting. Blood pressure again over 174 systolic today. No focal weakness or numbness per the patient. Chronic ear pain/ringing.  Past Medical History:  Diagnosis Date  . Alzheimer's dementia 07/30/2013   04/17/14 MoCA - Blind (Spanish version); 9 out of 22.  Correct MoCA = 12 out of 30.    . Arthritis   . Atherosclerosis of aorta ([place]) 10/05/2015  . Back pain 10/05/2015  . Calcification of native coronary artery 10/05/2015   Chest CT 2011.   . Carpal tunnel syndrome 06/29/2006   Qualifier: Diagnosis of  By: Benna Dunks    . Cervicalgia   . Cervicalgia 05/18/2009   Qualifier: Diagnosis of  By: Walker Kehr MD, Patrick Jupiter    . Chronic bilateral thoracic back pain 10/05/2015  . Chronic constipation   . Chronic diastolic heart failure ([place]) 09/01/2013  . Chronic pain syndrome 05/09/2014  . CKD (chronic kidney disease) stage 3, GFR 30-59 ml/min 09/03/2013  . Combined visual hearing impairment 11/12/2013  . Complete heart block ([place])   . Constipation   . Cryptogenic stroke ([place]) S/P  LOOP RECORDER IMPLANT   DX  01/2013  --  SYNCOPAL EPISODE --  EPISODIC  ATRIAL  TACHY/ FIBULATION  AND PAUSES  . CYSTOCELE/RECTOCELE/PROLAPSE,UNSPEC. 06/29/2006   Qualifier: Diagnosis of  By: Benna Dunks    . Depressive disorder 06/29/2006       . Diverticulosis of colon   . DIVERTICULOSIS OF COLON 06/29/2006   Qualifier: Diagnosis of  By: Benna Dunks    . Dry eyes   . Food impaction of esophagus   . Frequency of urination   . Gait instability   . Glaucoma 04/18/2014  . Glaucoma of both eyes   . Gout 09/21/2013  . Headache   . Hearing loss of aging 05/09/2014  . Hearing loss of both ears    no hearing aids  . HERNIA, INCISIONAL VENTRAL W/O OBST/GNGR 10/25/2006   Has been seen by surgery in the past and told that this is not dangerous and should only be treated particularly bothersome    . History of colon polyps   . History of Food impaction of esophagus   . HTN (hypertension) 06/29/2006   Isolated Systolic Hypertension - Ambulatory BP  Monitoring result 4/28-29/2015   . Hyperlipidemia 02/27/2015  . Hypertension   . Hypothyroidism   . Impaired mobility and ADLs 05/30/2014  . Insomnia   . INTERSTITIAL CYSTITIS 09/21/2009   Qualifier: Diagnosis of  By: Sarita Haver  MD, Coralyn Mark    . Loss of weight 04/05/2010   Qualifier: Diagnosis of  By: Walker Kehr MD, Patrick Jupiter    .  Lumbar stenosis   . Macular degeneration, age related, nonexudative 04/18/2014  . MASS, LUNG 04/05/2010   Qualifier: Diagnosis of  By: Walker Kehr MD, Patrick Jupiter    . Mobitz type 2 second degree heart block 08/29/2013  . Moderate dementia   . OA (osteoarthritis)    RIGHT SHOULDER AC JOINT  . OSTEOARTHRITIS, MULTI SITES 06/29/2006   Qualifier: Diagnosis of  By: Benna Dunks    . Osteoporosis   . OSTEOPOROSIS 03/20/2008   Qualifier: Diagnosis of  By: Jerline Pain MD, Anderson Malta    . Pacemaker   . 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 PERIPHERAL NEUROPATHY 03/29/2010   Qualifier: Diagnosis of  By: Walker Kehr MD, Patrick Jupiter    . Peripheral neuropathy ([place])   . Persistent headaches 09/06/2013    . Polymyalgia rheumatica ([place]) 11/08/2013  . Presence of permanent cardiac pacemaker   . Rib pain on left side 10/05/2015  . Right knee pain 01/15/2014  . Right rotator cuff tear   . Rotator cuff tear arthropathy 09/11/2012   Likely related to rotator cuff tendinopathy seen on musculoskeletal ultrasound, and GH DJD.  Complete tear of right supra and infraspinatus on MRI 10/2012     . S/P shoulder replacement 03/20/2015  . Shortness of breath 07/21/2014  . Shortness of breath dyspnea   . Spinal stenosis of lumbar region 10/03/2012   Patient has significant low back pain related to spinal stenosis and DJD, DDD.  We had a long discussion about treatment options which are quite limited. She is likely not a good surgical candidate for laminectomy. I am not sure about the usefulness of epidural steroid injection. We'll plan on restarting meloxicam after a long discussion of the pros and cons and risks of chronic kidney disease.  The patient and her family feel that the benefit of good pain control is the small risk.  Additionally she will followup with her primary care provider for creatinine monitoring.  Will obtain physical therapy for TENS unit trial.  Will refer to pain management for consideration of epidural steroid injection as that worked well in the past for cervical pain Followup here as needed   . SUI (stress urinary incontinence, female)   . Syncope and collapse 02/09/2013  . Urinary frequency 06/05/2015  . Urinary incontinence, mixed 06/29/2006   Qualifier: Diagnosis of  By: Benna Dunks    . Weakness 08/29/2013    Patient Active Problem List   Diagnosis Date Noted  . Hand Heaviness 03/17/2016  . Fall from bed 03/17/2016  . Anticoagulated by anticoagulation treatment 10/13/2015    Class: Chronic  . Collapsed vertebra due to osteoporosis ([place]) 10/06/2015  . Atherosclerosis of aorta ([place]) 10/05/2015  . Calcification of native coronary artery 10/05/2015  . Rib pain on left side 10/05/2015  .  Chronic bilateral thoracic back pain 10/05/2015  . Dysphagia 09/15/2015  . History of Food impaction of esophagus   . Esophageal dysmotility   . S/P shoulder replacement 03/20/2015  . Hyperlipidemia 02/27/2015  . GERD (gastroesophageal reflux disease) 07/25/2014  . Impaired mobility and ADLs 05/30/2014  . Chronic pain syndrome 05/09/2014  . Hearing loss of aging 05/09/2014  . Glaucoma 04/18/2014  . Macular degeneration, age related, nonexudative 04/18/2014  . Combined visual hearing impairment 11/12/2013  . History of acute gouty arthritis 09/21/2013  . Persistent headaches 09/06/2013  . 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 CKD (chronic kidney disease) stage 3, GFR 30-59 ml/min 09/03/2013  .  Chronic diastolic heart failure ([place]) 09/01/2013  . Constipation 08/23/2013  . Alzheimer's dementia 07/30/2013  . Spinal stenosis of lumbar region 10/03/2012  . Rotator cuff tear arthropathy 09/11/2012  . Gait instability 08/28/2012  . Insomnia 06/14/2010  . PERIPHERAL NEUROPATHY 03/29/2010  . HERNIA, INCISIONAL VENTRAL W/O OBST/GNGR 10/25/2006  . Hypothyroidism 06/29/2006  . Depressive disorder 06/29/2006  . HTN (hypertension) 06/29/2006  . CYSTOCELE/RECTOCELE/PROLAPSE,UNSPEC. 06/29/2006  . Urinary incontinence, mixed 06/29/2006  . OSTEOARTHRITIS, MULTI SITES 06/29/2006    Past Surgical History:  Procedure Laterality Date  . CARPAL TUNNEL RELEASE Right   . ESOPHAGOGASTRODUODENOSCOPY N/A 09/15/2015   Procedure: ESOPHAGOGASTRODUODENOSCOPY (EGD);  Surgeon: Clarene Essex, MD;  Location: [facility] ENDOSCOPY;  Service: Endoscopy;  Laterality: N/A;  . EYE SURGERY     cataract, bilat.  Randolm Idol / REPLACE / REMOVE PACEMAKER    . LOOP RECORDER IMPLANT  02-08-13; 08-30-13   MDT LinQ implanted by Dr Lovena Le for syncope, explanted 08-30-13 after CHB identified  . LOOP RECORDER IMPLANT N/A 02/11/2013   Procedure: LOOP RECORDER IMPLANT;  Surgeon: Evans Lance, MD;  Location: [facility] CATH LAB;  Service: Cardiovascular;  Laterality: N/A;  . PACEMAKER INSERTION  08-30-2013   MDT [id number] pacemaker implanted by Dr Rayann Heman for CHB  . PERMANENT PACEMAKER INSERTION N/A 08/30/2013   Procedure: PERMANENT PACEMAKER INSERTION;  Surgeon: Coralyn Mark, MD;  Location: [facility] CATH LAB;  Service: Cardiovascular;  Laterality: N/A;  . REVERSE SHOULDER ARTHROPLASTY Right 03/20/2015   Procedure: RIGHT REVERSE SHOULDER ARTHROPLASTY;  Surgeon: Netta Cedars, MD;  Location: [facility];  Service: Orthopedics;  Laterality: Right;  . TOTAL ABDOMINAL HYSTERECTOMY W/ BILATERAL SALPINGOOPHORECTOMY  03-15-2005    OB History    Gravida Para Term Preterm AB Living   0 0 0 0 0     SAB TAB Ectopic Multiple Live Births   0 0 0           Home Medications    Prior to Admission medications   Medication Sig Start Date End Date Taking? Authorizing Provider  acetaminophen (TYLENOL) 325 MG tablet Take 650 mg by mouth every 6 (six) hours as needed for mild pain.     Historical Provider, MD  allopurinol (ZYLOPRIM) 100 MG tablet take 1 tablet by mouth once daily 01/25/16   Blane Ohara McDiarmid, MD  apixaban (ELIQUIS) 2.5 MG TABS tablet Take 2.5 mg by mouth 2 (two) times daily.    Adrian Prows, MD  beta carotene w/minerals (OCUVITE) tablet Take 1 tablet by mouth daily. 04/17/14   Coral Spikes, DO  Calcium Carbonate-Vitamin D (CALTRATE 600+D) 600-400 MG-UNIT per tablet Take 1 tablet by mouth 2 (two) times daily.      Historical Provider, MD  colchicine 0.6 MG tablet Take 1 tablet (0.6 mg total) by mouth 2 (two) times daily as needed (GOUT). Patient not taking: Reported on 02/04/2016 07/09/15   Blane Ohara McDiarmid, MD  cycloSPORINE (RESTASIS) 0.05 % ophthalmic emulsion Place 1 drop into both eyes at bedtime.    Historical Provider, MD  DULoxetine (CYMBALTA) 60 MG capsule Take 1 capsule (60 mg total) by mouth daily. 11/04/15   Blane Ohara McDiarmid, MD  esomeprazole (NEXIUM) 40 MG capsule take 1 capsule by mouth twice a  day 08/25/15   Blane Ohara McDiarmid, MD  fesoterodine (TOVIAZ) 8 MG TB24 tablet Take 1 tablet (8 mg total) by mouth daily. 06/04/15   Blane Ohara McDiarmid, MD  Fish Oil-Cholecalciferol (FISH OIL + D3 PO) Take 1 capsule by mouth daily.  Historical Provider, MD  furosemide (LASIX) 20 MG tablet Take 1 tablet (20 mg total) by mouth daily. Patient not taking: Reported on 02/04/2016 07/09/15   Blane Ohara McDiarmid, MD  gabapentin (NEURONTIN) 100 MG capsule take 2 capsules by mouth three times a day 07/09/15   Blane Ohara McDiarmid, MD  glucosamine-chondroitin 500-400 MG tablet Take 1 tablet by mouth 3 (three) times daily.    Historical Provider, MD  levothyroxine (SYNTHROID, LEVOTHROID) 100 MCG tablet take 1 tablet by mouth once daily 02/29/16   Blane Ohara McDiarmid, MD  losartan (COZAAR) 50 MG tablet Take 25 mg by mouth daily.  08/17/15   Historical Provider, MD  meclizine (ANTIVERT) 25 MG tablet Take 1 tablet (25 mg total) by mouth 3 (three) times daily as needed for dizziness. 03/24/16   Sherwood Gambler, MD  metoprolol succinate (TOPROL-XL) 25 MG 24 hr tablet Take 25 mg by mouth daily. 09/21/15   Historical Provider, MD  ondansetron (ZOFRAN ODT) 4 MG disintegrating tablet Take 1 tablet (4 mg total) by mouth every 8 (eight) hours as needed for nausea or vomiting. 03/24/16   Sherwood Gambler, MD  ondansetron (ZOFRAN) 4 MG tablet Take 2 tablets (8 mg total) by mouth every 8 (eight) hours as needed for nausea or vomiting. 07/09/15   Blane Ohara McDiarmid, MD  oxyCODONE (OXY IR/ROXICODONE) 5 MG immediate release tablet Take 0.5 tablets (2.5 mg total) by mouth every 6 (six) hours as needed for moderate pain or severe pain. 02/04/16   Blane Ohara McDiarmid, MD  polyethylene glycol [facility] / Floria Raveling) packet take 17 grams by mouth once daily 09/29/15   Blane Ohara McDiarmid, MD  sucralfate (CARAFATE) 1 g tablet Take 1 tablet (1 g total) by mouth 4 (four) times daily -  with meals and at bedtime. 06/09/15   Blane Ohara McDiarmid, MD  TRAVATAN Z 0.004 % SOLN ophthalmic  solution Place 1 drop into both eyes at bedtime.  02/20/13   Historical Provider, MD  traZODone (DESYREL) 50 MG tablet take 1 tablet by mouth at bedtime 07/02/15   Blane Ohara McDiarmid, MD    Family History Family History  Problem Relation Age of Onset  . Kidney disease Mother   . Heart attack Neg Hx   . Stroke Neg Hx     Social History Social History  Substance Use Topics  . Smoking status: Never Smoker  . Smokeless tobacco: Not on file  . Alcohol use No     Allergies   Chlorthalidone; Honey bee treatment [bee venom]; Verapamil; Adhesive [tape]; and Amlodipine   Review of Systems Review of Systems  Constitutional: Negative for fever.  Eyes: Negative for photophobia and visual disturbance.  Respiratory: Negative for shortness of breath.   Cardiovascular: Negative for chest pain.  Gastrointestinal: Positive for nausea. Negative for abdominal pain and vomiting.  Neurological: Positive for dizziness and headaches. Negative for weakness and numbness.  All other systems reviewed and are negative.    Physical Exam Updated Vital Signs BP 171/67   Pulse 78   Temp 97.5 F (36.4 C) (Oral)   Resp 17   Ht 4\' 9"  (1.448 m)   Wt 136 lb (61.7 kg)   SpO2 95%   BMI 29.43 kg/m   Physical Exam  Constitutional: She is oriented to person, place, and time. She appears well-developed and well-nourished. No distress.  HENT:  Head: Normocephalic and atraumatic.  Right Ear: External ear normal.  Left Ear: External ear normal.  Nose: Nose normal.  Eyes: EOM are  normal. Pupils are equal, round, and reactive to light. Right eye exhibits no discharge. Left eye exhibits no discharge.  Bilateral nystagmus  Neck: Normal range of motion. Neck supple.  Cardiovascular: Normal rate, regular rhythm and normal heart sounds.   Pulmonary/Chest: Effort normal and breath sounds normal.  Abdominal: Soft. There is no tenderness.  Neurological: She is alert and oriented to person, place, and time.  CN  3-12 grossly intact. 5/5 strength in all 4 extremities. Grossly normal sensation. Normal finger to nose.  Skin: Skin is warm and dry. She is not diaphoretic.  Nursing note and vitals reviewed.    ED Treatments / Results  Labs (all labs ordered are listed, but only abnormal results are displayed) Labs Reviewed  CBC - Abnormal; Notable for the following:       Result Value   RBC 3.74 (*)    Platelets 140 (*)    All other components within normal limits  DIFFERENTIAL - Abnormal; Notable for the following:    Neutro Abs 7.9 (*)    All other components within normal limits  COMPREHENSIVE METABOLIC PANEL - Abnormal; Notable for the following:    Sodium 134 (*)    Chloride 98 (*)    Glucose, Bld 118 (*)    BUN 21 (*)    Creatinine, Ser 1.19 (*)    Albumin 3.1 (*)    ALT 13 (*)    GFR calc non Af Amer 38 (*)    GFR calc Af Amer 44 (*)    All other components within normal limits  URINALYSIS, ROUTINE W REFLEX MICROSCOPIC (NOT AT [facility]) - Abnormal; Notable for the following:    APPearance CLOUDY (*)    Hgb urine dipstick MODERATE (*)    Protein, ur 100 (*)    Leukocytes, UA SMALL (*)    All other components within normal limits  URINE MICROSCOPIC-ADD ON - Abnormal; Notable for the following:    Squamous Epithelial / LPF 0-5 (*)    Bacteria, UA MANY (*)    All other components within normal limits  I-STAT CHEM 8, ED - Abnormal; Notable for the following:    Chloride 99 (*)    BUN 26 (*)    Creatinine, Ser 1.10 (*)    Glucose, Bld 111 (*)    All other components within normal limits  ETHANOL  PROTIME-INR  APTT  RAPID URINE DRUG SCREEN, HOSP PERFORMED  I-STAT TROPOININ, ED    EKG  EKG Interpretation None       Radiology Ct Angio Head W Or Wo Contrast  Result Date: 03/24/2016 CLINICAL DATA:  Dizziness. Posterior headaches for 2 days. Vertigo. EXAM: CT ANGIOGRAPHY HEAD AND NECK TECHNIQUE: Multidetector CT imaging of the head and neck was performed using the standard  protocol during bolus administration of intravenous contrast. Multiplanar CT image reconstructions and MIPs were obtained to evaluate the vascular anatomy. Carotid stenosis measurements (when applicable) are obtained utilizing NASCET criteria, using the distal internal carotid diameter as the denominator. CONTRAST:  50 mL Isovue 370 COMPARISON:  CT head without contrast from the same day. CT cervical spine 02/08/2013. CT head without contrast 09/28/2004. FINDINGS: CTA NECK FINDINGS Aortic arch: A 3 vessel arch configuration is present. Atherosclerotic changes are present at the aorta. Both calcified and noncalcified plaque are present without significant aneurysm. There is no significant stenosis at the origin of the great vessels. Right carotid system: The right common carotid artery demonstrates moderate tortuosity without significant focal stenosis. The bifurcation is somewhat  low. There is no focal stenosis at the bifurcation. Mild tortuosity is present in the cervical right ICA without a significant stenosis. Left carotid system: A left common carotid artery is also tortuous. The bifurcation demonstrates minimal atherosclerotic change without significant stenosis. There is tortuosity and slight irregularity of the mid cervical left ICA without a significant stenosis. Vertebral arteries: Both vertebral arteries originate from the subclavian arteries. There is proximal tortuosity without significant stenosis. The vertebral arteries are codominant. There is no significant focal stenosis or vascular injury to either vertebral artery in the neck. Skeleton: There is chronic anterolisthesis and fusion of posterior elements at C5-6. Chronic endplate changes are present at C6-7 as well. Alignment is stable. No focal lytic or blastic lesions are present. The patient is edentulous. Other neck: No focal mucosal or submucosal lesions are present. Multinodular thyroid goiter is present and stable. No significant adenopathy  is present. Salivary glands are within normal limits. Upper chest: Biapical pleural parenchymal scarring is present. There is no significant focal nodule or mass lesion. Scarring is stable. The superior mediastinum is otherwise unremarkable. Left subclavian pacemaker is in place. Review of the MIP images confirms the above findings CTA HEAD FINDINGS Anterior circulation: Atherosclerotic calcifications are present within many cavernous internal carotid arteries bilaterally. Neck 8 mm narrow necked heavily calcified and para ophthalmic artery aneurysm in is again noted on the left. The ICA terminus is normal bilaterally. The A1 and M1 segments are normal. Anterior communicating artery is patent. The MCA bifurcations are intact a 2 mm left MCA bifurcation aneurysm is present. No other significant aneurysms are present. ACA and MCA branch vessels are within normal limits. Posterior circulation: The right vertebral artery is dominant. The left PICA origin is visualized and normal. The right AICA is dominant. Basilar artery is within normal limits. Both posterior cerebral arteries originate from basilar tip. A prominent left posterior communicating artery is noted as well. The PCA branch vessels are intact. Venous sinuses: The dural sinuses are patent. The left transverse sinus is dominant. The straight sinus and deep cerebral veins are intact. Cortical veins are unremarkable. Anatomic variants: Prominent left posterior communicating artery contributes to the left posterior cerebral artery. Delayed phase: The postcontrast images demonstrate no pathologic enhancement. Review of the MIP images confirms the above findings IMPRESSION: 1. 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 2. Atherosclerotic changes within the cavernous internal carotid arteries bilaterally without other significant his stenosis. 3. 2 mm left MCA bifurcation aneurysm. 4. Tortuosity and slight irregularity of the mid  cervical ICA bilaterally. FMD is also considered. 5. Multilevel spondylosis in the cervical spine. Electronically Signed   By: San Morelle M.D.   On: 03/24/2016 13:54   Ct Head Wo Contrast  Result Date: 03/24/2016 CLINICAL DATA:  Onset of posterior headache, dizziness, generalized weakness EXAM: CT HEAD WITHOUT CONTRAST TECHNIQUE: Contiguous axial images were obtained from the base of the skull through the vertex without intravenous contrast. COMPARISON:  PET-CT 09/21/2015 FINDINGS: Brain: No acute intracranial hemorrhage. No focal mass lesion. No CT evidence of acute infarction. No midline shift or mass effect. No hydrocephalus. Basilar cisterns are patent. There is hypodensity in the LEFT and RIGHT external capsules positive unchanged. Mild periventricular white matter hypodensity. Mild atrophy Vascular: Vascular calcification of the circle Willis vessels. Calcified LEFT supraclinoid aneurysm measuring 8 mm unchanged. Skull: No fracture Sinuses/Orbits: Paranasal sinuses and mastoid air cells are clear. Orbits are clear. Other: None IMPRESSION: 1. No acute intracranial  findings. 2. Deep white matter microvascular disease unchanged. 3. Moderate atrophy. Electronically Signed   By: Suzy Bouchard M.D.   On: 03/24/2016 12:14   Ct Angio Neck W And/or Wo Contrast  Result Date: 03/24/2016 CLINICAL DATA:  Dizziness. Posterior headaches for 2 days. Vertigo. EXAM: CT ANGIOGRAPHY HEAD AND NECK TECHNIQUE: Multidetector CT imaging of the head and neck was performed using the standard protocol during bolus administration of intravenous contrast. Multiplanar CT image reconstructions and MIPs were obtained to evaluate the vascular anatomy. Carotid stenosis measurements (when applicable) are obtained utilizing NASCET criteria, using the distal internal carotid diameter as the denominator. CONTRAST:  50 mL Isovue 370 COMPARISON:  CT head without contrast from the same day. CT cervical spine 02/08/2013. CT head  without contrast 09/28/2004. FINDINGS: CTA NECK FINDINGS Aortic arch: A 3 vessel arch configuration is present. Atherosclerotic changes are present at the aorta. Both calcified and noncalcified plaque are present without significant aneurysm. There is no significant stenosis at the origin of the great vessels. Right carotid system: The right common carotid artery demonstrates moderate tortuosity without significant focal stenosis. The bifurcation is somewhat low. There is no focal stenosis at the bifurcation. Mild tortuosity is present in the cervical right ICA without a significant stenosis. Left carotid system: A left common carotid artery is also tortuous. The bifurcation demonstrates minimal atherosclerotic change without significant stenosis. There is tortuosity and slight irregularity of the mid cervical left ICA without a significant stenosis. Vertebral arteries: Both vertebral arteries originate from the subclavian arteries. There is proximal tortuosity without significant stenosis. The vertebral arteries are codominant. There is no significant focal stenosis or vascular injury to either vertebral artery in the neck. Skeleton: There is chronic anterolisthesis and fusion of posterior elements at C5-6. Chronic endplate changes are present at C6-7 as well. Alignment is stable. No focal lytic or blastic lesions are present. The patient is edentulous. Other neck: No focal mucosal or submucosal lesions are present. Multinodular thyroid goiter is present and stable. No significant adenopathy is present. Salivary glands are within normal limits. Upper chest: Biapical pleural parenchymal scarring is present. There is no significant focal nodule or mass lesion. Scarring is stable. The superior mediastinum is otherwise unremarkable. Left subclavian pacemaker is in place. Review of the MIP images confirms the above findings CTA HEAD FINDINGS Anterior circulation: Atherosclerotic calcifications are present within many  cavernous internal carotid arteries bilaterally. Neck 8 mm narrow necked heavily calcified and para ophthalmic artery aneurysm in is again noted on the left. The ICA terminus is normal bilaterally. The A1 and M1 segments are normal. Anterior communicating artery is patent. The MCA bifurcations are intact a 2 mm left MCA bifurcation aneurysm is present. No other significant aneurysms are present. ACA and MCA branch vessels are within normal limits. Posterior circulation: The right vertebral artery is dominant. The left PICA origin is visualized and normal. The right AICA is dominant. Basilar artery is within normal limits. Both posterior cerebral arteries originate from basilar tip. A prominent left posterior communicating artery is noted as well. The PCA branch vessels are intact. Venous sinuses: The dural sinuses are patent. The left transverse sinus is dominant. The straight sinus and deep cerebral veins are intact. Cortical veins are unremarkable. Anatomic variants: Prominent left posterior communicating artery contributes to the left posterior cerebral artery. Delayed phase: The postcontrast images demonstrate no pathologic enhancement. Review of the MIP images confirms the above findings IMPRESSION: 1. 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 2. Atherosclerotic changes within the cavernous internal carotid arteries bilaterally without other significant his stenosis. 3. 2 mm left MCA bifurcation aneurysm. 4. Tortuosity and slight irregularity of the mid cervical ICA bilaterally. FMD is also considered. 5. Multilevel spondylosis in the cervical spine. Electronically Signed   By: San Morelle M.D.   On: 03/24/2016 13:54    Procedures Procedures (including critical care time)  Medications Ordered in ED Medications  meclizine (ANTIVERT) tablet 25 mg (25 mg Oral Given 03/24/16 1228)  iopamidol (ISOVUE-370) 76 % injection (50 mLs  Contrast Given 03/24/16 1320)      Initial Impression / Assessment and Plan / ED Course  I have reviewed the triage vital signs and the nursing notes.  Pertinent labs & imaging results that were available during my care of the patient were reviewed by me and considered in my medical decision making (see chart for details).  Clinical Course as of Mar 24 2133  Thu Mar 24, 2016  1142 Her dizziness appears to be from vertigo. Antivert, labs, CT head. While stroke is certainly the differential given her age and multiple risk factors, I do not think this is an acute, code stroke (had symptoms including headache yesterday, never came back to baseline)  [SG]  1246 Neuro recommends CTA  [SG]    Clinical Course User Index [SG] Sherwood Gambler, MD    Neuro recommends CTA head/neck and if negative d/c home. She is asymptomatic and able to walk with a walker (which is what she uses at baseline). CT with incidental findings but no vertebral/basilar findings. Feels well. Pacemaker interrogation without acute findings. D/c home with ENT (has ear trouble for months) and return precautions.  Final Clinical Impressions(s) / ED Diagnoses   Final diagnoses:  Peripheral vertigo, unspecified laterality    New Prescriptions Discharge Medication List as of 03/24/2016  4:04 PM    START taking these medications   Details  meclizine (ANTIVERT) 25 MG tablet Take 1 tablet (25 mg total) by mouth 3 (three) times daily as needed for dizziness., Starting Thu 03/24/2016, Print    ondansetron (ZOFRAN ODT) 4 MG disintegrating tablet Take 1 tablet (4 mg total) by mouth every 8 (eight) hours as needed for nausea or vomiting., Starting Thu 03/24/2016, Print         Sherwood Gambler, MD 03/24/16 2135

## 2016-04-18 ENCOUNTER — Telehealth: Payer: Self-pay | Admitting: *Deleted

## 2016-04-18 NOTE — Telephone Encounter (Signed)
Daughter called and would like to pick up a urine cup for patient due to caregiver seeing "blood mixed into urine".  She states that they have a hard time getting patient to come in and would like to drop off a urine sample tomorrow morning for testing.  Will forward to MD to advise and put in a future order. Stein Windhorst,CMA

## 2016-04-19 ENCOUNTER — Ambulatory Visit (INDEPENDENT_AMBULATORY_CARE_PROVIDER_SITE_OTHER): Payer: Medicare Other | Admitting: *Deleted

## 2016-04-19 DIAGNOSIS — R300 Dysuria: Secondary | ICD-10-CM

## 2016-04-19 LAB — POCT URINALYSIS DIPSTICK
BILIRUBIN UA: NEGATIVE
GLUCOSE UA: NEGATIVE
Ketones, UA: NEGATIVE
NITRITE UA: NEGATIVE
Protein, UA: 100
Spec Grav, UA: 1.015
UROBILINOGEN UA: 0.2
pH, UA: 6.5

## 2016-04-19 NOTE — Telephone Encounter (Signed)
Spoke with daughter and informed her that they would need an appointment.  She did make one for tomorrow afternoon but states that she still wants to bring a urine sample in today to discuss at tomorrow's visit since her mother has a hard time getting a urine sample in the office.  Will forward to MD to make aware. [name],CMA

## 2016-04-19 NOTE — Telephone Encounter (Signed)
Please advise Jordan [name] will need to be evaluated in our clinic if there is suspected UTI.

## 2016-04-20 ENCOUNTER — Ambulatory Visit (INDEPENDENT_AMBULATORY_CARE_PROVIDER_SITE_OTHER): Payer: Medicare Other | Admitting: Family Medicine

## 2016-04-20 VITALS — BP 102/52 | HR 67 | Temp 98.1°F | Ht <= 58 in | Wt 139.6 lb

## 2016-04-20 DIAGNOSIS — R8271 Bacteriuria: Secondary | ICD-10-CM

## 2016-04-20 NOTE — Patient Instructions (Addendum)
She may have a urinary tract infection that is causing the microscopic blood in her urine.  Though she does not have symptoms, so we typically do not treat this.  If you develop fever, pain with urinating, feel like you are urinating frequently, have abdominal pain you will need an antibiotic.  We will contact you with the results of the urine culture.  If this is negative, I would recommend that you call [facility] to schedule an appointment for further evaluation.  Drink plenty of water.  Follow up with Dr McDiarmid in 2 weeks for repeat urinalysis.

## 2016-04-20 NOTE — Progress Notes (Signed)
   Subjective: CC: UTI HPI:Jordan [name] is a 82 y.o. female presenting to clinic today for same day appointment. PCP: MCDIARMID,TODD D, MD Concerns today include:  1. UTI Patient reports that Monday patient's urine was very dark, noticed by CMA.  Urine sample was brought to [facility] for evaluation.  Denies fevers, chills.  Denies dysuria, hematuria.  Denies frequency, urgency.  She reports foul odors to urine that was observed by RN aide at home, which prompted concern for UTI.  Per son, patient is acting her normal self. Her mentation is baseline.  She is completely asymptomatic.    Allergies  Allergen Reactions  . Chlorthalidone Other (See Comments)    Hyponatremia  . Honey Bee Treatment [Bee Venom] Swelling and Rash  . Verapamil Other (See Comments)    Bradycardia - HR  35-45 on 40mg  TID  . Adhesive [Tape] Rash and Other (See Comments)    Skin irritation  . Amlodipine Other (See Comments)    Peripheral edema with 2.5mg  daily dose   Social History Reviewed: never smoker.   FamHx and MedHx reviewed.  Please see EMR.  ROS: Per HPI  Objective: Office vital signs reviewed. BP (!) 102/52   Pulse 67   Temp 98.1 F (36.7 C) (Oral)   Ht 4\' 9"  (1.448 m)   Wt 139 lb 9.6 oz (63.3 kg)   SpO2 98%   BMI 30.21 kg/m   Physical Examination:  General: Awake, alert, well nourished, elderly female in No acute distress accompanied to visit by her son. HEENT: Normal    Eyes: sclera white Cardio: regular rate Pulm: normal work of breathing on room air GI: soft, non-tender, non-distended, no masses palpated GU:no suprapubic TTP, no CVA TTP  Results for orders placed or performed in visit on 04/19/16 (from the past 48 hour(s))  POCT urinalysis dipstick     Status: Abnormal   Collection Time: 04/19/16  3:30 PM  Result Value Ref Range   Color, UA YELLOW    Clarity, UA CLEAR    Glucose, UA NEG    Bilirubin, UA NEG    Ketones, UA NEG    Spec Grav, UA 1.015    Blood, UA LARGE    pH, UA 6.5      Protein, UA 100    Urobilinogen, UA 0.2    Nitrite, UA NEG    Leukocytes, UA large (3+) (A) Negative    Assessment/ Plan: 82 y.o. female   1. Asymptomatic bacteriuria.  UA with leukocytes and blood.  UCx pending.  Given absence of symptoms, I do no think patient needs abx at this time.  She is afebrile and well appearing.  I discussed at length with patient and son return precautions and indications for abx treatment.  I do think that it would be pertinent to obtain repeat UA to evaluate for persistent hematuria in about 2 weeks.  If persistent would refer back to her urologist at [facility] for further evaluation if desired.   Janora Norlander, DO PGY-3, [facility]

## 2016-04-21 ENCOUNTER — Telehealth: Payer: Self-pay | Admitting: Family Medicine

## 2016-04-21 LAB — URINE CULTURE

## 2016-04-21 MED ORDER — CEFIXIME 400 MG PO CAPS: 400.0000 mg | ORAL_CAPSULE | Freq: Every day | ORAL | 0 refills | Status: DC

## 2016-04-21 NOTE — Telephone Encounter (Signed)
I spoke with patient's daughter to discuss urine culture result which was positive for Ecoli. Sensitivity still pending. During last visit, she had change in urine color and a foul smelling odor to her urine ( These are positive UTI symptoms).   Her daughter stated her UTI symptoms is often atypical. Now she is feeling a bit tire, however no fever.   As discussed with her daughter,I will sent in A/B today once sensitivity result is available. If not available by the end of the day, I will send in a broad spectrum antibiotic.   I advised daughter to bring her in soon if symptoms persists or worsens. She verbalized understanding.  Awaiting sensitivity report before A/B.

## 2016-04-21 NOTE — Telephone Encounter (Signed)
Still awaiting sensitivity report. Likely will not be available till tomorrow. I will go ahead and prescribe Suprax. F/U as instructed.

## 2016-04-28 DIAGNOSIS — R42 Dizziness and giddiness: Secondary | ICD-10-CM | POA: Diagnosis not present

## 2016-04-28 DIAGNOSIS — I951 Orthostatic hypotension: Secondary | ICD-10-CM | POA: Diagnosis not present

## 2016-04-28 HISTORY — DX: Orthostatic hypotension: I95.1

## 2016-05-03 ENCOUNTER — Telehealth: Payer: Self-pay | Admitting: Family Medicine

## 2016-05-03 NOTE — Telephone Encounter (Signed)
Family Medicine After hours phone call  Patient's daughter is calling due to concerns for new onset elbow pain. She denies any injury or trauma to this area. She states that yesterday the elbow got acutely swollen, warm, and painful. She endorses a history of gout in the past and states that this seems to be similar to previous flares. She endorses good compliance with her medication regimen including her allopurinol.  Recommended occasional OTC NSAIDs and rest (daughter states that patient has an expired prescription for colchicine, I could not encourage taking this medication due to it being expired). Return precautions discussed if patient becomes febrile.  Same-day appointment scheduled with Dr. Gerlean Ren tomorrow, 05/04/16 at 1:30 PM  Elberta Leatherwood, MD,MS,  PGY3 05/03/2016 2:46 PM

## 2016-05-04 ENCOUNTER — Ambulatory Visit: Payer: Self-pay | Admitting: Family Medicine

## 2016-05-05 ENCOUNTER — Encounter: Payer: Self-pay | Admitting: Family Medicine

## 2016-05-05 ENCOUNTER — Ambulatory Visit (INDEPENDENT_AMBULATORY_CARE_PROVIDER_SITE_OTHER): Payer: Medicare Other | Admitting: Family Medicine

## 2016-05-05 VITALS — BP 110/63 | HR 60 | Temp 97.3°F | Wt 134.6 lb

## 2016-05-05 DIAGNOSIS — M109 Gout, unspecified: Secondary | ICD-10-CM | POA: Diagnosis not present

## 2016-05-05 DIAGNOSIS — M1000 Idiopathic gout, unspecified site: Secondary | ICD-10-CM | POA: Insufficient documentation

## 2016-05-05 MED ORDER — PREDNISONE 20 MG PO TABS: 40.0000 mg | ORAL_TABLET | Freq: Every day | ORAL | 0 refills | Status: DC

## 2016-05-05 NOTE — Assessment & Plan Note (Signed)
Symptoms consistent with acute gout flare. Doubt infection given lack of fever or other system symptoms. Doubt fracture given lack of falls. No history of RA or other inflammatory arthropathies. Will treat with short course of prednisone. Will avoid NSAIDs given her renal and cardiac history. Continue colchicine and allopurinol. Strict return precautions reviewed. Follow up as needed.

## 2016-05-05 NOTE — Progress Notes (Signed)
   Subjective:  Jordan [name] is a 83 y.o. female who presents to the [facility] today with a chief complaint of right.  HPI:  Right Elbow Pain Symptoms started about two days ago. Elbow feels warm and is tender to the touch. No fevers or chills. Has severe pain that is preventing her from lifting objections. No falls, trauma, or other precipitating event. Had a similar episode a few years ago and was diagnosed with a gout flare. Has been taking colchicine over the past couple of days without significant improvement.   ROS: Per HPI  PMH: Smoking history reviewed.   Objective:  Physical Exam: BP 110/63   Pulse 60   Temp 97.3 F (36.3 C) (Oral)   Wt 134 lb 9.6 oz (61.1 kg)   SpO2 98%   BMI 29.13 kg/m   Gen: 83 yo F in NAD, resting comfortably MSK:  - Right elbow: Slight erythema to lateral epicondyle without deformities. Mild effusion noted. FROM. Tender to palpation over lateral epicondyle. Strength 5/5 throughout.  Skin: warm, dry  Assessment/Plan:  Gout Symptoms consistent with acute gout flare. Doubt infection given lack of fever or other system symptoms. Doubt fracture given lack of falls. No history of RA or other inflammatory arthropathies. Will treat with short course of prednisone. Will avoid NSAIDs given her renal and cardiac history. Continue colchicine and allopurinol. Strict return precautions reviewed. Follow up as needed.   Algis Greenhouse. Jerline Pain, [place] [facility] Resident PGY-3 05/05/2016 5:05 PM

## 2016-05-05 NOTE — Patient Instructions (Signed)
Start the prednisone.  Continue the allopurinol and colchicine.   Come back if it worsens or does not get better in the next few days.  Take care,  Dr Jerline Pain

## 2016-05-14 ENCOUNTER — Encounter (HOSPITAL_COMMUNITY): Payer: Self-pay | Admitting: Emergency Medicine

## 2016-05-14 ENCOUNTER — Observation Stay (HOSPITAL_COMMUNITY): Payer: Medicare Other

## 2016-05-14 ENCOUNTER — Observation Stay (HOSPITAL_COMMUNITY)
Admission: EM | Admit: 2016-05-14 | Discharge: 2016-05-16 | Disposition: A | Discharge status: Home / Self Care | Payer: Medicare Other | Attending: Family Medicine | Admitting: Family Medicine

## 2016-05-14 ENCOUNTER — Other Ambulatory Visit (HOSPITAL_COMMUNITY): Payer: Self-pay

## 2016-05-14 ENCOUNTER — Other Ambulatory Visit: Payer: Self-pay

## 2016-05-14 ENCOUNTER — Emergency Department (HOSPITAL_COMMUNITY): Payer: Medicare Other

## 2016-05-14 DIAGNOSIS — Z7901 Long term (current) use of anticoagulants: Secondary | ICD-10-CM | POA: Diagnosis not present

## 2016-05-14 DIAGNOSIS — R058 Other specified cough: Secondary | ICD-10-CM

## 2016-05-14 DIAGNOSIS — I4891 Unspecified atrial fibrillation: Secondary | ICD-10-CM | POA: Diagnosis not present

## 2016-05-14 DIAGNOSIS — I130 Hypertensive heart and chronic kidney disease with heart failure and stage 1 through stage 4 chronic kidney disease, or unspecified chronic kidney disease: Secondary | ICD-10-CM | POA: Diagnosis not present

## 2016-05-14 DIAGNOSIS — R823 Hemoglobinuria: Secondary | ICD-10-CM | POA: Diagnosis not present

## 2016-05-14 DIAGNOSIS — I5032 Chronic diastolic (congestive) heart failure: Secondary | ICD-10-CM | POA: Diagnosis not present

## 2016-05-14 DIAGNOSIS — E878 Other disorders of electrolyte and fluid balance, not elsewhere classified: Secondary | ICD-10-CM | POA: Diagnosis not present

## 2016-05-14 DIAGNOSIS — J101 Influenza due to other identified influenza virus with other respiratory manifestations: Principal | ICD-10-CM | POA: Diagnosis present

## 2016-05-14 DIAGNOSIS — N179 Acute kidney failure, unspecified: Secondary | ICD-10-CM

## 2016-05-14 DIAGNOSIS — M109 Gout, unspecified: Secondary | ICD-10-CM | POA: Diagnosis not present

## 2016-05-14 DIAGNOSIS — G309 Alzheimer's disease, unspecified: Secondary | ICD-10-CM | POA: Diagnosis not present

## 2016-05-14 DIAGNOSIS — Z96611 Presence of right artificial shoulder joint: Secondary | ICD-10-CM | POA: Diagnosis not present

## 2016-05-14 DIAGNOSIS — Z8673 Personal history of transient ischemic attack (TIA), and cerebral infarction without residual deficits: Secondary | ICD-10-CM | POA: Diagnosis not present

## 2016-05-14 DIAGNOSIS — R7989 Other specified abnormal findings of blood chemistry: Secondary | ICD-10-CM

## 2016-05-14 DIAGNOSIS — D72829 Elevated white blood cell count, unspecified: Secondary | ICD-10-CM | POA: Diagnosis not present

## 2016-05-14 DIAGNOSIS — E871 Hypo-osmolality and hyponatremia: Secondary | ICD-10-CM | POA: Diagnosis not present

## 2016-05-14 DIAGNOSIS — Z79899 Other long term (current) drug therapy: Secondary | ICD-10-CM | POA: Insufficient documentation

## 2016-05-14 DIAGNOSIS — J479 Bronchiectasis, uncomplicated: Secondary | ICD-10-CM | POA: Diagnosis not present

## 2016-05-14 DIAGNOSIS — M4854XA Collapsed vertebra, not elsewhere classified, thoracic region, initial encounter for fracture: Secondary | ICD-10-CM | POA: Insufficient documentation

## 2016-05-14 DIAGNOSIS — E785 Hyperlipidemia, unspecified: Secondary | ICD-10-CM | POA: Insufficient documentation

## 2016-05-14 DIAGNOSIS — F0280 Dementia in other diseases classified elsewhere without behavioral disturbance: Secondary | ICD-10-CM | POA: Diagnosis not present

## 2016-05-14 DIAGNOSIS — R05 Cough: Secondary | ICD-10-CM | POA: Diagnosis present

## 2016-05-14 DIAGNOSIS — M4856XA Collapsed vertebra, not elsewhere classified, lumbar region, initial encounter for fracture: Secondary | ICD-10-CM | POA: Diagnosis not present

## 2016-05-14 DIAGNOSIS — F329 Major depressive disorder, single episode, unspecified: Secondary | ICD-10-CM | POA: Insufficient documentation

## 2016-05-14 DIAGNOSIS — Z7952 Long term (current) use of systemic steroids: Secondary | ICD-10-CM | POA: Diagnosis not present

## 2016-05-14 DIAGNOSIS — N183 Chronic kidney disease, stage 3 (moderate): Secondary | ICD-10-CM | POA: Insufficient documentation

## 2016-05-14 DIAGNOSIS — K5909 Other constipation: Secondary | ICD-10-CM | POA: Insufficient documentation

## 2016-05-14 DIAGNOSIS — Z950 Presence of cardiac pacemaker: Secondary | ICD-10-CM | POA: Diagnosis not present

## 2016-05-14 DIAGNOSIS — I2510 Atherosclerotic heart disease of native coronary artery without angina pectoris: Secondary | ICD-10-CM | POA: Insufficient documentation

## 2016-05-14 DIAGNOSIS — R5383 Other fatigue: Secondary | ICD-10-CM | POA: Diagnosis not present

## 2016-05-14 DIAGNOSIS — E039 Hypothyroidism, unspecified: Secondary | ICD-10-CM | POA: Diagnosis not present

## 2016-05-14 HISTORY — DX: Influenza due to other identified influenza virus with other respiratory manifestations: J10.1

## 2016-05-14 HISTORY — DX: Heart failure, unspecified: I50.9

## 2016-05-14 LAB — BASIC METABOLIC PANEL WITH GFR
Anion gap: 12 (ref 5–15)
BUN: 25 mg/dL — ABNORMAL HIGH (ref 6–20)
CO2: 24 mmol/L (ref 22–32)
Calcium: 8.7 mg/dL — ABNORMAL LOW (ref 8.9–10.3)
Chloride: 95 mmol/L — ABNORMAL LOW (ref 101–111)
Creatinine, Ser: 1.4 mg/dL — ABNORMAL HIGH (ref 0.44–1.00)
GFR calc Af Amer: 36 mL/min — ABNORMAL LOW
GFR calc non Af Amer: 31 mL/min — ABNORMAL LOW
Glucose, Bld: 98 mg/dL (ref 65–99)
Potassium: 4.5 mmol/L (ref 3.5–5.1)
Sodium: 131 mmol/L — ABNORMAL LOW (ref 135–145)

## 2016-05-14 LAB — CBC WITH DIFFERENTIAL/PLATELET
Basophils Absolute: 0 K/uL (ref 0.0–0.1)
Basophils Relative: 0 %
Eosinophils Absolute: 0.1 K/uL (ref 0.0–0.7)
Eosinophils Relative: 1 %
HCT: 36.9 % (ref 36.0–46.0)
Hemoglobin: 11.9 g/dL — ABNORMAL LOW (ref 12.0–15.0)
Lymphocytes Relative: 11 %
Lymphs Abs: 1.3 K/uL (ref 0.7–4.0)
MCH: 31.4 pg (ref 26.0–34.0)
MCHC: 32.2 g/dL (ref 30.0–36.0)
MCV: 97.4 fL (ref 78.0–100.0)
Monocytes Absolute: 0.9 K/uL (ref 0.1–1.0)
Monocytes Relative: 8 %
Neutro Abs: 9 K/uL — ABNORMAL HIGH (ref 1.7–7.7)
Neutrophils Relative %: 80 %
Platelets: 143 K/uL — ABNORMAL LOW (ref 150–400)
RBC: 3.79 MIL/uL — ABNORMAL LOW (ref 3.87–5.11)
RDW: 13.4 % (ref 11.5–15.5)
WBC: 11.3 K/uL — ABNORMAL HIGH (ref 4.0–10.5)

## 2016-05-14 LAB — URINALYSIS, ROUTINE W REFLEX MICROSCOPIC
BILIRUBIN URINE: NEGATIVE
GLUCOSE, UA: NEGATIVE mg/dL
Ketones, ur: NEGATIVE mg/dL
NITRITE: NEGATIVE
PROTEIN: 100 mg/dL — AB
Specific Gravity, Urine: 1.011 (ref 1.005–1.030)
Squamous Epithelial / LPF: NONE SEEN
pH: 6 (ref 5.0–8.0)

## 2016-05-14 LAB — INFLUENZA PANEL BY PCR (TYPE A & B)
Influenza A By PCR: POSITIVE — AB
Influenza B By PCR: NEGATIVE

## 2016-05-14 MED ORDER — DULOXETINE HCL 60 MG PO CPEP
60.0000 mg | ORAL_CAPSULE | Freq: Every day | ORAL | Status: DC
  Administered 2016-05-15 – 2016-05-16 (×2): 60 mg via ORAL
  Filled 2016-05-14 (×2): qty 1

## 2016-05-14 MED ORDER — SODIUM CHLORIDE 0.9 % IV BOLUS (SEPSIS)
500.0000 mL | Freq: Once | INTRAVENOUS | Status: AC
  Administered 2016-05-14: 500 mL via INTRAVENOUS

## 2016-05-14 MED ORDER — OSELTAMIVIR PHOSPHATE 30 MG PO CAPS
30.0000 mg | ORAL_CAPSULE | Freq: Every day | ORAL | Status: DC
  Administered 2016-05-15 – 2016-05-16 (×2): 30 mg via ORAL
  Filled 2016-05-14 (×2): qty 1

## 2016-05-14 MED ORDER — OSELTAMIVIR PHOSPHATE 30 MG PO CAPS
30.0000 mg | ORAL_CAPSULE | Freq: Once | ORAL | Status: AC
  Administered 2016-05-14: 30 mg via ORAL
  Filled 2016-05-14 (×2): qty 1

## 2016-05-14 MED ORDER — GUAIFENESIN-DM 100-10 MG/5ML PO SYRP
5.0000 mL | ORAL_SOLUTION | Freq: Three times a day (TID) | ORAL | Status: DC | PRN
  Administered 2016-05-14 – 2016-05-16 (×3): 5 mL via ORAL
  Filled 2016-05-14 (×3): qty 5

## 2016-05-14 MED ORDER — IOPAMIDOL (ISOVUE-300) INJECTION 61%
INTRAVENOUS | Status: AC
  Administered 2016-05-14: 75 mL
  Filled 2016-05-14: qty 75

## 2016-05-14 MED ORDER — APIXABAN 2.5 MG PO TABS
2.5000 mg | ORAL_TABLET | Freq: Two times a day (BID) | ORAL | Status: DC
  Administered 2016-05-14 – 2016-05-16 (×4): 2.5 mg via ORAL
  Filled 2016-05-14 (×4): qty 1

## 2016-05-14 MED ORDER — POLYETHYLENE GLYCOL 3350 17 G PO PACK: 17.0000 g | PACK | Freq: Every day | ORAL | Status: DC | PRN

## 2016-05-14 MED ORDER — GABAPENTIN 100 MG PO CAPS
100.0000 mg | ORAL_CAPSULE | Freq: Three times a day (TID) | ORAL | Status: DC
  Administered 2016-05-14 – 2016-05-16 (×5): 100 mg via ORAL
  Filled 2016-05-14 (×5): qty 1

## 2016-05-14 MED ORDER — TRAZODONE HCL 50 MG PO TABS
50.0000 mg | ORAL_TABLET | Freq: Every day | ORAL | Status: DC
  Administered 2016-05-14 – 2016-05-15 (×2): 50 mg via ORAL
  Filled 2016-05-14 (×2): qty 1

## 2016-05-14 MED ORDER — FESOTERODINE FUMARATE ER 8 MG PO TB24
8.0000 mg | ORAL_TABLET | Freq: Every day | ORAL | Status: DC
  Administered 2016-05-15 – 2016-05-16 (×2): 8 mg via ORAL
  Filled 2016-05-14 (×2): qty 1

## 2016-05-14 MED ORDER — IPRATROPIUM-ALBUTEROL 0.5-2.5 (3) MG/3ML IN SOLN
3.0000 mL | RESPIRATORY_TRACT | Status: AC
  Administered 2016-05-14: 3 mL via RESPIRATORY_TRACT
  Filled 2016-05-14: qty 3

## 2016-05-14 MED ORDER — CYCLOSPORINE 0.05 % OP EMUL
1.0000 [drp] | Freq: Every day | OPHTHALMIC | Status: DC
  Administered 2016-05-14 – 2016-05-15 (×2): 1 [drp] via OPHTHALMIC
  Filled 2016-05-14 (×2): qty 1

## 2016-05-14 MED ORDER — ACETAMINOPHEN 325 MG PO TABS: 650.0000 mg | ORAL_TABLET | Freq: Four times a day (QID) | ORAL | Status: DC | PRN

## 2016-05-14 MED ORDER — LOSARTAN POTASSIUM 50 MG PO TABS: 25.0000 mg | ORAL_TABLET | Freq: Every day | ORAL | Status: DC

## 2016-05-14 MED ORDER — PANTOPRAZOLE SODIUM 40 MG PO TBEC
40.0000 mg | DELAYED_RELEASE_TABLET | Freq: Every day | ORAL | Status: DC
  Administered 2016-05-15 – 2016-05-16 (×2): 40 mg via ORAL
  Filled 2016-05-14 (×3): qty 1

## 2016-05-14 MED ORDER — LEVOTHYROXINE SODIUM 100 MCG PO TABS
100.0000 ug | ORAL_TABLET | Freq: Every day | ORAL | Status: DC
  Administered 2016-05-15 – 2016-05-16 (×2): 100 ug via ORAL
  Filled 2016-05-14 (×2): qty 1

## 2016-05-14 MED ORDER — SODIUM CHLORIDE 0.9 % IV SOLN
INTRAVENOUS | Status: DC
  Administered 2016-05-14 – 2016-05-15 (×2): via INTRAVENOUS

## 2016-05-14 MED ORDER — METOPROLOL SUCCINATE ER 25 MG PO TB24
25.0000 mg | ORAL_TABLET | Freq: Every day | ORAL | Status: DC
  Administered 2016-05-15 – 2016-05-16 (×2): 25 mg via ORAL
  Filled 2016-05-14 (×2): qty 1

## 2016-05-14 MED ORDER — ONDANSETRON HCL 4 MG PO TABS: 4.0000 mg | ORAL_TABLET | Freq: Four times a day (QID) | ORAL | Status: DC | PRN

## 2016-05-14 MED ORDER — LATANOPROST 0.005 % OP SOLN
1.0000 [drp] | Freq: Every day | OPHTHALMIC | Status: DC
  Administered 2016-05-14 – 2016-05-15 (×2): 1 [drp] via OPHTHALMIC
  Filled 2016-05-14: qty 2.5

## 2016-05-14 MED ORDER — OSELTAMIVIR PHOSPHATE 75 MG PO CAPS: 75.0000 mg | ORAL_CAPSULE | Freq: Once | ORAL | Status: DC

## 2016-05-14 MED ORDER — COLCHICINE 0.6 MG PO TABS: 0.6000 mg | ORAL_TABLET | Freq: Two times a day (BID) | ORAL | Status: DC | PRN

## 2016-05-14 MED ORDER — ACETAMINOPHEN 650 MG RE SUPP
650.0000 mg | Freq: Four times a day (QID) | RECTAL | Status: DC | PRN
Start: 2016-05-14 — End: 2016-05-15

## 2016-05-14 MED ORDER — ALLOPURINOL 100 MG PO TABS
100.0000 mg | ORAL_TABLET | Freq: Every day | ORAL | Status: DC
  Administered 2016-05-15 – 2016-05-16 (×2): 100 mg via ORAL
  Filled 2016-05-14 (×2): qty 1

## 2016-05-14 MED ORDER — IPRATROPIUM-ALBUTEROL 0.5-2.5 (3) MG/3ML IN SOLN
3.0000 mL | RESPIRATORY_TRACT | Status: DC | PRN
  Administered 2016-05-15 (×2): 3 mL via RESPIRATORY_TRACT
  Filled 2016-05-14 (×2): qty 3

## 2016-05-14 MED ORDER — ONDANSETRON HCL 4 MG/2ML IJ SOLN: 4.0000 mg | Freq: Four times a day (QID) | INTRAMUSCULAR | Status: DC | PRN

## 2016-05-14 NOTE — ED Notes (Signed)
Pt ambulates with walker at home per family; pt is continent of urine and uses bedpan

## 2016-05-14 NOTE — ED Notes (Signed)
Pt remains in CT, to be transported to floor once arrival back

## 2016-05-14 NOTE — H&P (Signed)
[facility] Admission History and Physical Service Pager: [phone]  [name] Medical record number: [id number] Date of birth: [birth year] Age: 83 y.o. Gender: [name]  Primary Care Provider: MCDIARMID,TODD D, MD Consultants: None Code Status: Full  Chief Complaint: Cough and weakness  Assessment and Plan: Jordan [name] is a 83 y.o. [name] presenting with cough and weakness and found to be flu positive. Admitted due to additional poor PO intake and AKI. PMH is significant for Pacemaker 2/2 3rd degree AV block, hx of gout, GERD, HTN, HLD, Depression, CKD, HFpEF, Hypothyroidism, mild dementia, hx afib on eliquis.  Cough: Patient found to be influenza A positive. Vitals stable. Maintaining oxygen saturation on RA. CXR without infiltrates or effusion. CT chest ordered by ED physician for concern for possible PNA missed by CXR given productive cough. This showed peribronchial thickening with scattered bronchiectasis, possibly due to mucus plugging.  - Admit to observation, Attending Dr. Erin Hearing - Begin treatment with tamiflu, 30 mg BID (renally dosed) - Robitussin DM prn for cough - Duonebs q2h prn - Droplet precautions - O2 supplementation if O2 sats < 90% - Trend leukocytosis  Hemoglobinuria: Large hgb on UA. Small leukocytes and negative nitrites. Had moderate hgb on UA 1 month ago. No fever or suprapubic tenderness to suggest UTI. Was treated for E. coli UTI in December with Suprax.  - Obtain urine culture - Would f/u with outpatient urologist if hemoglobinuria persists  Compression fractures: New thoracic fracture noted since 10/05/15.  - prn tylenol  Hypothyroidism - Continue home synthroid 100 mcg daily  AKI: Known CKD III, Baseline SCr ~ 1.1. Suspect pre-renal in the setting of decreased PO and appears dry on exam.  - Trend Scr  - IVFs @  75 mL/hr  Hx of 3rd Degree block- Pacemaker.  - EKG showing v-paced   HFpEF- 2016 ECHO EF  19-37%, Grade 1 diastolic dysfunction  - holding lasix for AKI  Depression  -Continue home cymbalta   HTN: BP 117/57 on admission  - holding Losartan for AKI - continue home toprol 25 mg daily  Hx gout: At risk for flare in setting of dehydration - Continue home allopurinol and colchicine  Hx of afib: On eliquis. Not to discontinue per Dr. Einar Gip. CHADS-VASC score of 7.   FEN/GI: Dysphagia III diet (recommended during last admission), IVFs @ 75, protonix (takes nexium at home) Prophylaxis: On eliquis  Disposition: Home pending clinical improvement  History of Present Illness:  Jordan [name] is a 83 y.o. [name] presenting with cough and increased weakness. Per son, she has not been feeling well since Monday. She has had chest congestion and has had difficulty expectorating, producing only a little dark yellow phlegm. She has been more short of breath and has complained of whole body pain. Her appetite has been poor -- she has only had hot tea and cookies today. She has also been drinking some water. Son has been sick with bronchitis and has been taking antibiotics. Highest temperature has been 100.1 F. She has some baseline memory difficulties, e.g., cannot remember the names of people on her favorite soap operas, but has had no change in mental status this weak per son. Normally gets around at home with walker but finding this more difficult this week.   Review Of Systems: Per HPI with the following additions:   Review of Systems  Constitutional: Positive for chills.  HENT: Positive for congestion. Negative for sore throat.   Eyes: Negative for  redness.  Respiratory: Positive for cough, sputum production and shortness of breath.   Cardiovascular: Negative for chest pain and palpitations.  Gastrointestinal: Negative for diarrhea and vomiting.  Genitourinary: Negative for dysuria.  Musculoskeletal: Positive for myalgias. Negative for falls.  Skin: Negative for rash.  Neurological:  Positive for weakness. Negative for loss of consciousness.  Psychiatric/Behavioral: Positive for memory loss.    Patient Active Problem List   Diagnosis Date Noted  . Gout 05/05/2016  . Hand Heaviness 03/17/2016  . Fall from bed 03/17/2016  . Anticoagulated by anticoagulation treatment 10/13/2015    Class: Chronic  . Collapsed vertebra due to osteoporosis ([place]) 10/06/2015  . Atherosclerosis of aorta ([place]) 10/05/2015  . Calcification of native coronary artery 10/05/2015  . Rib pain on left side 10/05/2015  . Chronic bilateral thoracic back pain 10/05/2015  . Dysphagia 09/15/2015  . History of Food impaction of esophagus   . Esophageal dysmotility   . S/P shoulder replacement 03/20/2015  . Hyperlipidemia 02/27/2015  . GERD (gastroesophageal reflux disease) 07/25/2014  . Impaired mobility and ADLs 05/30/2014  . Chronic pain syndrome 05/09/2014  . Hearing loss of aging 05/09/2014  . Glaucoma 04/18/2014  . Macular degeneration, age related, nonexudative 04/18/2014  . Combined visual hearing impairment 11/12/2013  . History of acute gouty arthritis 09/21/2013  . Persistent headaches 09/06/2013  . 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 CKD (chronic kidney disease) stage 3, GFR 30-59 ml/min 09/03/2013  . Chronic diastolic heart failure ([place]) 09/01/2013  . Constipation 08/23/2013  . Alzheimer's dementia 07/30/2013  . Spinal stenosis of lumbar region 10/03/2012  . Rotator cuff tear arthropathy 09/11/2012  . Gait instability 08/28/2012  . Insomnia 06/14/2010  . PERIPHERAL NEUROPATHY 03/29/2010  . HERNIA, INCISIONAL VENTRAL W/O OBST/GNGR 10/25/2006  . Hypothyroidism 06/29/2006  . Depressive disorder 06/29/2006  . HTN (hypertension) 06/29/2006  . CYSTOCELE/RECTOCELE/PROLAPSE,UNSPEC. 06/29/2006  . Urinary incontinence, mixed 06/29/2006  . OSTEOARTHRITIS, MULTI SITES 06/29/2006    Past Medical History: Past Medical History:   Diagnosis Date  . Alzheimer's dementia 07/30/2013   04/17/14 MoCA - Blind (Spanish version); 9 out of 22.  Correct MoCA = 12 out of 30.    . Arthritis   . Atherosclerosis of aorta ([place]) 10/05/2015  . Back pain 10/05/2015  . Calcification of native coronary artery 10/05/2015   Chest CT 2011.   . Carpal tunnel syndrome 06/29/2006   Qualifier: Diagnosis of  By: Benna Dunks    . Cervicalgia   . Cervicalgia 05/18/2009   Qualifier: Diagnosis of  By: Walker Kehr MD, Patrick Jupiter    . Chronic bilateral thoracic back pain 10/05/2015  . Chronic constipation   . Chronic diastolic heart failure ([place]) 09/01/2013  . Chronic pain syndrome 05/09/2014  . CKD (chronic kidney disease) stage 3, GFR 30-59 ml/min 09/03/2013  . Combined visual hearing impairment 11/12/2013  . Complete heart block ([place])   . Constipation   . Cryptogenic stroke ([place]) S/P  LOOP RECORDER IMPLANT   DX  01/2013  --  SYNCOPAL EPISODE --  EPISODIC  ATRIAL TACHY/ FIBULATION  AND PAUSES  . CYSTOCELE/RECTOCELE/PROLAPSE,UNSPEC. 06/29/2006   Qualifier: Diagnosis of  By: Benna Dunks    . Depressive disorder 06/29/2006       . Diverticulosis of colon   . DIVERTICULOSIS OF COLON 06/29/2006   Qualifier: Diagnosis of  By: Benna Dunks    . Dry eyes   . Food impaction of esophagus   . Frequency of urination   .  Gait instability   . Glaucoma 04/18/2014  . Glaucoma of both eyes   . Gout 09/21/2013  . Headache   . Hearing loss of aging 05/09/2014  . Hearing loss of both ears    no hearing aids  . HERNIA, INCISIONAL VENTRAL W/O OBST/GNGR 10/25/2006   Has been seen by surgery in the past and told that this is not dangerous and should only be treated particularly bothersome    . History of colon polyps   . History of Food impaction of esophagus   . HTN (hypertension) 06/29/2006   Isolated Systolic Hypertension - Ambulatory BP  Monitoring result 4/28-29/2015   . Hyperlipidemia 02/27/2015  . Hypertension   . Hypothyroidism   . Impaired mobility and ADLs 05/30/2014  .  Insomnia   . INTERSTITIAL CYSTITIS 09/21/2009   Qualifier: Diagnosis of  By: Sarita Haver  MD, Coralyn Mark    . Loss of weight 04/05/2010   Qualifier: Diagnosis of  By: Walker Kehr MD, Patrick Jupiter    . Lumbar stenosis   . Macular degeneration, age related, nonexudative 04/18/2014  . MASS, LUNG 04/05/2010   Qualifier: Diagnosis of  By: Walker Kehr MD, Patrick Jupiter    . Mobitz type 2 second degree heart block 08/29/2013  . Moderate dementia   . OA (osteoarthritis)    RIGHT SHOULDER AC JOINT  . OSTEOARTHRITIS, MULTI SITES 06/29/2006   Qualifier: Diagnosis of  By: Benna Dunks    . Osteoporosis   . OSTEOPOROSIS 03/20/2008   Qualifier: Diagnosis of  By: Jerline Pain MD, Anderson Malta    . Pacemaker   . 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 PERIPHERAL NEUROPATHY 03/29/2010   Qualifier: Diagnosis of  By: Walker Kehr MD, Patrick Jupiter    . Peripheral neuropathy ([place])   . Persistent headaches 09/06/2013  . Polymyalgia rheumatica ([place]) 11/08/2013  . Presence of permanent cardiac pacemaker   . Rib pain on left side 10/05/2015  . Right knee pain 01/15/2014  . Right rotator cuff tear   . Rotator cuff tear arthropathy 09/11/2012   Likely related to rotator cuff tendinopathy seen on musculoskeletal ultrasound, and GH DJD.  Complete tear of right supra and infraspinatus on MRI 10/2012     . S/P shoulder replacement 03/20/2015  . Shortness of breath 07/21/2014  . Shortness of breath dyspnea   . Spinal stenosis of lumbar region 10/03/2012   Patient has significant low back pain related to spinal stenosis and DJD, DDD.  We had a long discussion about treatment options which are quite limited. She is likely not a good surgical candidate for laminectomy. I am not sure about the usefulness of epidural steroid injection. We'll plan on restarting meloxicam after a long discussion of the pros and cons and risks of chronic kidney disease.  The patient and her family feel that the benefit of good pain control is the small risk.   Additionally she will followup with her primary care provider for creatinine monitoring.  Will obtain physical therapy for TENS unit trial.  Will refer to pain management for consideration of epidural steroid injection as that worked well in the past for cervical pain Followup here as needed   . SUI (stress urinary incontinence, [name])   . Syncope and collapse 02/09/2013  . Urinary frequency 06/05/2015  . Urinary incontinence, mixed 06/29/2006   Qualifier: Diagnosis of  By: Benna Dunks    . Weakness 08/29/2013    Past Surgical History: Past Surgical History:  Procedure Laterality Date  . CARPAL TUNNEL  RELEASE Right   . ESOPHAGOGASTRODUODENOSCOPY N/A 09/15/2015   Procedure: ESOPHAGOGASTRODUODENOSCOPY (EGD);  Surgeon: Clarene Essex, MD;  Location: [facility] ENDOSCOPY;  Service: Endoscopy;  Laterality: N/A;  . EYE SURGERY     cataract, bilat.  Randolm Idol / REPLACE / REMOVE PACEMAKER    . LOOP RECORDER IMPLANT  02-08-13; 08-30-13   MDT LinQ implanted by Dr Lovena Le for syncope, explanted 08-30-13 after CHB identified  . LOOP RECORDER IMPLANT N/A 02/11/2013   Procedure: LOOP RECORDER IMPLANT;  Surgeon: Evans Lance, MD;  Location: [facility] CATH LAB;  Service: Cardiovascular;  Laterality: N/A;  . PACEMAKER INSERTION  08-30-2013   MDT ADDRL1 pacemaker implanted by Dr Rayann Heman for CHB  . PERMANENT PACEMAKER INSERTION N/A 08/30/2013   Procedure: PERMANENT PACEMAKER INSERTION;  Surgeon: Coralyn Mark, MD;  Location: [facility] CATH LAB;  Service: Cardiovascular;  Laterality: N/A;  . REVERSE SHOULDER ARTHROPLASTY Right 03/20/2015   Procedure: RIGHT REVERSE SHOULDER ARTHROPLASTY;  Surgeon: Netta Cedars, MD;  Location: [facility];  Service: Orthopedics;  Laterality: Right;  . TOTAL ABDOMINAL HYSTERECTOMY W/ BILATERAL SALPINGOOPHORECTOMY  03-15-2005    Social History: Social History  Substance Use Topics  . Smoking status: Never Smoker  . Smokeless tobacco: Never Used  . Alcohol use No   Additional social history: Lives with son;  never smoked, does not drink Please also refer to relevant sections of EMR.  Family History: Family History  Problem Relation Age of Onset  . Kidney disease Mother   . Heart attack Neg Hx   . Stroke Neg Hx     Allergies and Medications: Allergies  Allergen Reactions  . Chlorthalidone Other (See Comments)    Hyponatremia  . Honey Bee Treatment [Bee Venom] Swelling and Rash  . Verapamil Other (See Comments)    Bradycardia - HR  35-45 on 40mg  TID  . Adhesive [Tape] Rash and Other (See Comments)    Skin irritation  . Amlodipine Other (See Comments)    Peripheral edema with 2.5mg  daily dose   No current facility-administered medications on file prior to encounter.    Current Outpatient Prescriptions on File Prior to Encounter  Medication Sig Dispense Refill  . acetaminophen (TYLENOL) 325 MG tablet Take 650 mg by mouth every 6 (six) hours as needed for mild pain.     Marland Kitchen allopurinol (ZYLOPRIM) 100 MG tablet take 1 tablet by mouth once daily 30 tablet 12  . apixaban (ELIQUIS) 2.5 MG TABS tablet Take 2.5 mg by mouth 2 (two) times daily.    . beta carotene w/minerals (OCUVITE) tablet Take 1 tablet by mouth daily. 90 tablet 3  . Calcium Carbonate-Vitamin D (CALTRATE 600+D) 600-400 MG-UNIT per tablet Take 1 tablet by mouth 2 (two) times daily.      . Cefixime (SUPRAX) 400 MG CAPS capsule Take 1 capsule (400 mg total) by mouth daily. 10 capsule 0  . colchicine 0.6 MG tablet Take 1 tablet (0.6 mg total) by mouth 2 (two) times daily as needed (GOUT). (Patient not taking: Reported on 02/04/2016) 60 tablet 5  . cycloSPORINE (RESTASIS) 0.05 % ophthalmic emulsion Place 1 drop into both eyes at bedtime.    . DULoxetine (CYMBALTA) 60 MG capsule Take 1 capsule (60 mg total) by mouth daily. 30 capsule PRN  . esomeprazole (NEXIUM) 40 MG capsule take 1 capsule by mouth twice a day 60 capsule 1  . fesoterodine (TOVIAZ) 8 MG TB24 tablet Take 1 tablet (8 mg total) by mouth daily. 30 tablet PRN  . Fish  Oil-Cholecalciferol (  FISH OIL + D3 PO) Take 1 capsule by mouth daily.    . furosemide (LASIX) 20 MG tablet Take 1 tablet (20 mg total) by mouth daily. (Patient not taking: Reported on 02/04/2016) 30 tablet 5  . gabapentin (NEURONTIN) 100 MG capsule take 2 capsules by mouth three times a day 180 capsule 5  . glucosamine-chondroitin 500-400 MG tablet Take 1 tablet by mouth 3 (three) times daily.    Marland Kitchen levothyroxine (SYNTHROID, LEVOTHROID) 100 MCG tablet take 1 tablet by mouth once daily 90 tablet 1  . losartan (COZAAR) 50 MG tablet Take 25 mg by mouth daily.   0  . meclizine (ANTIVERT) 25 MG tablet Take 1 tablet (25 mg total) by mouth 3 (three) times daily as needed for dizziness. 15 tablet 0  . metoprolol succinate (TOPROL-XL) 25 MG 24 hr tablet Take 25 mg by mouth daily.  0  . ondansetron (ZOFRAN ODT) 4 MG disintegrating tablet Take 1 tablet (4 mg total) by mouth every 8 (eight) hours as needed for nausea or vomiting. 10 tablet 0  . ondansetron (ZOFRAN) 4 MG tablet Take 2 tablets (8 mg total) by mouth every 8 (eight) hours as needed for nausea or vomiting. 20 tablet 0  . oxyCODONE (OXY IR/ROXICODONE) 5 MG immediate release tablet Take 0.5 tablets (2.5 mg total) by mouth every 6 (six) hours as needed for moderate pain or severe pain. 60 tablet 0  . polyethylene glycol (MIRALAX / GLYCOLAX) packet take 17 grams by mouth once daily 100 packet prn  . predniSONE (DELTASONE) 20 MG tablet Take 2 tablets (40 mg total) by mouth daily with breakfast. 10 tablet 0  . sucralfate (CARAFATE) 1 g tablet Take 1 tablet (1 g total) by mouth 4 (four) times daily -  with meals and at bedtime. 120 tablet 2  . TRAVATAN Z 0.004 % SOLN ophthalmic solution Place 1 drop into both eyes at bedtime.     . traZODone (DESYREL) 50 MG tablet take 1 tablet by mouth at bedtime 90 tablet 3    Objective: BP 165/65   Pulse 91   Temp 98.6 F (37 C) (Oral)   Resp 20   Ht 5\' 2"  (1.575 m)   Wt 134 lb (60.8 kg)   SpO2 96%   BMI 24.51  kg/m  Exam: General: Tired appearing [name], in NAD Eyes: No conjunctivitis. EOMI.  ENTM: Dry mucous membranes. Normal posterior oropharynx. Nasal congestion present. No lymphadenopathy.  Neck: FROM. Supple.  Cardiovascular: RRR, S1, S2, no m/r/g, cap refill < 2 secs Respiratory: Coarse breath sounds with diffuse wheezes. Speaking in short sentences but no increased WOB.  Gastrointestinal: +BS, soft, NT, ND, no rebound or guarding MSK: No LE edema, moves all extremities Derm: No wounds or rashes noted Neuro: Answers questions appropriately. Alert.  Psych: Flat affect. Makes eye contact.   Labs and Imaging: CBC BMET   Recent Labs Lab 05/14/16 1757  WBC 11.3*  HGB 11.9*  HCT 36.9  PLT 143*    Recent Labs Lab 05/14/16 1757  NA 131*  K 4.5  CL 95*  CO2 24  BUN 25*  CREATININE 1.40*  GLUCOSE 98  CALCIUM 8.7*     Dg Chest 2 View  Result Date: 05/14/2016 CLINICAL DATA:  Cough and fever. EXAM: CHEST  2 VIEW COMPARISON:  None. FINDINGS: The lungs are clear wiithout focal pneumonia, edema, pneumothorax or pleural effusion. Interstitial markings are diffusely coarsened with chronic features. Cardiopericardial silhouette is at upper limits of normal for size.  Bones are diffusely demineralized. Lower thoracic compression fracture noted. Patient is status post right shoulder replacement. Dual lead left permanent pacemaker again noted. IMPRESSION: No active cardiopulmonary disease. Electronically Signed   By: Misty Stanley M.D.   On: 05/14/2016 14:25   Ct Chest W Contrast  Result Date: 05/14/2016 CLINICAL DATA:  Fever, chills and cough for 6 days question pneumonia EXAM: CT CHEST WITH CONTRAST TECHNIQUE: Multidetector CT imaging of the chest was performed during intravenous contrast administration. CONTRAST:  75 ML ISOVUE-300 IOPAMIDOL (ISOVUE-300) INJECTION 61% IV hand injection COMPARISON:  Chest radiographs 05/14/2016 and 10/05/2015, CT chest 04/16/2010 FINDINGS: Cardiovascular:  Atherosclerotic calcifications aorta, coronary arteries and proximal great vessels. LEFT subclavian pacemaker with leads projecting at RIGHT atrium and RIGHT ventricle. Aorta normal caliber. No pericardial effusion. LEFT atrium appears dilated. Mediastinum/Nodes: Esophagus unremarkable. Base of cervical region normal appearance. Beam hardening artifacts from RIGHT shoulder prosthesis. No definite thoracic adenopathy, though normal size nodes are seen at the mediastinum Lungs/Pleura: Scattered peribronchial thickening. Respiratory motion artifacts degrade exam. Scattered bronchiectasis in the lower lobes bilaterally, minimally in lingula. Question few areas of mucous plugging. No definite pulmonary infiltrate, pleural effusion or pneumothorax. Biapical scarring. Upper Abdomen: Unremarkable Musculoskeletal: Osseous demineralization with compression fractures of lower thoracic and upper lumbar vertebral bodies small scattered degenerative disc disease changes. IMPRESSION: No pulmonary infiltrates identified. Peribronchial thickening with scattered bronchiectasis in the lower lobes and minimally in lingula, some which are associated with mucous plugging. Aortic atherosclerosis with coronary arterial calcifications. Osseous demineralization with lower thoracic and upper lumbar compression fractures ; thoracic fracture is new since 10/05/2015, upper lumbar fracture was present previously. Electronically Signed   By: Lavonia Dana M.D.   On: 05/14/2016 20:04   Rogue Bussing, MD 05/14/2016, 7:17 PM PGY-2, [facility] pager: [phone], text pages welcome

## 2016-05-14 NOTE — ED Notes (Signed)
Patient transported to CT 

## 2016-05-14 NOTE — ED Triage Notes (Signed)
The patient's son said she started with cough, runny nose and fever.  The patient's son has not given her any medications because she takes alot of medications.  She says her head hurts and under her left breast.   She rates her generalized pain 8/10.

## 2016-05-14 NOTE — ED Provider Notes (Signed)
[facility] DEPT Provider Note  CSN: [id number] Arrival date & time: 05/14/16  1258   History   Chief Complaint Chief Complaint  Patient presents with  . Cough    The patient's son said she started with cough, runny nose and fever.  The patient's son has not given her any medications because she takes alot of medications.  She says her head hurts and under her left breast.     HPI Jordan [name] is a 83 y.o. female.  The history is provided by the patient and medical records. A language interpreter was used.  Illness  This is a new problem. The current episode started more than 2 days ago. The problem occurs constantly. The problem has been gradually worsening. Associated symptoms include shortness of breath. Pertinent negatives include no chest pain, no abdominal pain and no headaches. Nothing aggravates the symptoms. Nothing relieves the symptoms. She has tried nothing for the symptoms.    Past Medical History:  Diagnosis Date  . Alzheimer's dementia 07/30/2013   04/17/14 MoCA - Blind (Spanish version); 9 out of 22.  Correct MoCA = 12 out of 30.    . Arthritis   . Atherosclerosis of aorta ([place]) 10/05/2015  . Back pain 10/05/2015  . Calcification of native coronary artery 10/05/2015   Chest CT 2011.   . Carpal tunnel syndrome 06/29/2006   Qualifier: Diagnosis of  By: Benna Dunks    . Cervicalgia   . Cervicalgia 05/18/2009   Qualifier: Diagnosis of  By: Walker Kehr MD, Patrick Jupiter    . CHF (congestive heart failure) ([place])   . Chronic bilateral thoracic back pain 10/05/2015  . Chronic constipation   . Chronic diastolic heart failure ([place]) 09/01/2013  . Chronic pain syndrome 05/09/2014  . CKD (chronic kidney disease) stage 3, GFR 30-59 ml/min 09/03/2013  . Combined visual hearing impairment 11/12/2013  . Complete heart block ([place])   . Constipation   . Cryptogenic stroke ([place]) S/P  LOOP RECORDER IMPLANT   DX  01/2013  --  SYNCOPAL EPISODE --  EPISODIC  ATRIAL TACHY/ FIBULATION  AND PAUSES  .  CYSTOCELE/RECTOCELE/PROLAPSE,UNSPEC. 06/29/2006   Qualifier: Diagnosis of  By: Benna Dunks    . Depressive disorder 06/29/2006       . Diverticulosis of colon   . DIVERTICULOSIS OF COLON 06/29/2006   Qualifier: Diagnosis of  By: Benna Dunks    . Dry eyes   . Food impaction of esophagus   . Frequency of urination   . Gait instability   . Glaucoma 04/18/2014  . Glaucoma of both eyes   . Gout 09/21/2013  . Headache   . Hearing loss of aging 05/09/2014  . Hearing loss of both ears    no hearing aids  . HERNIA, INCISIONAL VENTRAL W/O OBST/GNGR 10/25/2006   Has been seen by surgery in the past and told that this is not dangerous and should only be treated particularly bothersome    . History of colon polyps   . History of Food impaction of esophagus   . HTN (hypertension) 06/29/2006   Isolated Systolic Hypertension - Ambulatory BP  Monitoring result 4/28-29/2015   . Hyperlipidemia 02/27/2015  . Hypertension   . Hypothyroidism   . Impaired mobility and ADLs 05/30/2014  . Insomnia   . INTERSTITIAL CYSTITIS 09/21/2009   Qualifier: Diagnosis of  By: Sarita Haver  MD, Coralyn Mark    . Loss of weight 04/05/2010   Qualifier: Diagnosis of  By: Walker Kehr MD, Patrick Jupiter    . Lumbar  stenosis   . Macular degeneration, age related, nonexudative 04/18/2014  . MASS, LUNG 04/05/2010   Qualifier: Diagnosis of  By: Walker Kehr MD, Patrick Jupiter    . Mobitz type 2 second degree heart block 08/29/2013  . Moderate dementia   . OA (osteoarthritis)    RIGHT SHOULDER AC JOINT  . OSTEOARTHRITIS, MULTI SITES 06/29/2006   Qualifier: Diagnosis of  By: Benna Dunks    . Osteoporosis   . OSTEOPOROSIS 03/20/2008   Qualifier: Diagnosis of  By: Jerline Pain MD, Anderson [place]    . Pacemaker   . 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 PERIPHERAL NEUROPATHY 03/29/2010   Qualifier: Diagnosis of  By: Walker Kehr MD, Patrick Jupiter    . Peripheral neuropathy ([place])   . Persistent headaches 09/06/2013  . Polymyalgia rheumatica ([place])  11/08/2013  . Presence of permanent cardiac pacemaker   . Rib pain on left side 10/05/2015  . Right knee pain 01/15/2014  . Right rotator cuff tear   . Rotator cuff tear arthropathy 09/11/2012   Likely related to rotator cuff tendinopathy seen on musculoskeletal ultrasound, and GH DJD.  Complete tear of right supra and infraspinatus on MRI 10/2012     . S/P shoulder replacement 03/20/2015  . Shortness of breath 07/21/2014  . Shortness of breath dyspnea   . Spinal stenosis of lumbar region 10/03/2012   Patient has significant low back pain related to spinal stenosis and DJD, DDD.  We had a long discussion about treatment options which are quite limited. She is likely not a good surgical candidate for laminectomy. I am not sure about the usefulness of epidural steroid injection. We'll plan on restarting meloxicam after a long discussion of the pros and cons and risks of chronic kidney disease.  The patient and her family feel that the benefit of good pain control is the small risk.  Additionally she will followup with her primary care provider for creatinine monitoring.  Will obtain physical therapy for TENS unit trial.  Will refer to pain management for consideration of epidural steroid injection as that worked well in the past for cervical pain Followup here as needed   . SUI (stress urinary incontinence, female)   . Syncope and collapse 02/09/2013  . Urinary frequency 06/05/2015  . Urinary incontinence, mixed 06/29/2006   Qualifier: Diagnosis of  By: Benna Dunks    . Weakness 08/29/2013   Patient Active Problem List   Diagnosis Date Noted  . Influenza 05/14/2016  . Gout 05/05/2016  . Hand Heaviness 03/17/2016  . Fall from bed 03/17/2016  . Anticoagulated by anticoagulation treatment 10/13/2015    Class: Chronic  . Collapsed vertebra due to osteoporosis ([place]) 10/06/2015  . Atherosclerosis of aorta ([place]) 10/05/2015  . Calcification of native coronary artery 10/05/2015  . Rib pain on left side  10/05/2015  . Chronic bilateral thoracic back pain 10/05/2015  . Dysphagia 09/15/2015  . History of Food impaction of esophagus   . Esophageal dysmotility   . S/P shoulder replacement 03/20/2015  . Hyperlipidemia 02/27/2015  . GERD (gastroesophageal reflux disease) 07/25/2014  . Impaired mobility and ADLs 05/30/2014  . Chronic pain syndrome 05/09/2014  . Hearing loss of aging 05/09/2014  . Glaucoma 04/18/2014  . Macular degeneration, age related, nonexudative 04/18/2014  . Combined visual hearing impairment 11/12/2013  . History of acute gouty arthritis 09/21/2013  . Persistent headaches 09/06/2013  . 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 CKD (chronic kidney disease) stage  3, GFR 30-59 ml/min 09/03/2013  . Chronic diastolic heart failure ([place]) 09/01/2013  . Constipation 08/23/2013  . Alzheimer's dementia 07/30/2013  . Spinal stenosis of lumbar region 10/03/2012  . Rotator cuff tear arthropathy 09/11/2012  . Gait instability 08/28/2012  . Insomnia 06/14/2010  . PERIPHERAL NEUROPATHY 03/29/2010  . HERNIA, INCISIONAL VENTRAL W/O OBST/GNGR 10/25/2006  . Hypothyroidism 06/29/2006  . Depressive disorder 06/29/2006  . HTN (hypertension) 06/29/2006  . CYSTOCELE/RECTOCELE/PROLAPSE,UNSPEC. 06/29/2006  . Urinary incontinence, mixed 06/29/2006  . OSTEOARTHRITIS, MULTI SITES 06/29/2006   Past Surgical History:  Procedure Laterality Date  . CARPAL TUNNEL RELEASE Right   . ESOPHAGOGASTRODUODENOSCOPY N/A 09/15/2015   Procedure: ESOPHAGOGASTRODUODENOSCOPY (EGD);  Surgeon: Clarene Essex, MD;  [facility] ENDOSCOPY;  Service: Endoscopy;  Laterality: N/A;  . EYE SURGERY     cataract, bilat.  Randolm Idol / REPLACE / REMOVE PACEMAKER    . LOOP RECORDER IMPLANT  02-08-13; 08-30-13   MDT LinQ implanted by Dr Lovena Le for syncope, explanted 08-30-13 after CHB identified  . LOOP RECORDER IMPLANT N/A 02/11/2013   Procedure: LOOP RECORDER IMPLANT;  Surgeon:  Evans Lance, MD;  Location: [facility] CATH LAB;  Service: Cardiovascular;  Laterality: N/A;  . PACEMAKER INSERTION  08-30-2013   MDT ADDRL1 pacemaker implanted by Dr Rayann Heman for CHB  . PERMANENT PACEMAKER INSERTION N/A 08/30/2013   Procedure: PERMANENT PACEMAKER INSERTION;  Surgeon: Coralyn Mark, MD;  Location: [facility] CATH LAB;  Service: Cardiovascular;  Laterality: N/A;  . REVERSE SHOULDER ARTHROPLASTY Right 03/20/2015   Procedure: RIGHT REVERSE SHOULDER ARTHROPLASTY;  Surgeon: Netta Cedars, MD;  Location: [facility];  Service: Orthopedics;  Laterality: Right;  . TOTAL ABDOMINAL HYSTERECTOMY W/ BILATERAL SALPINGOOPHORECTOMY  03-15-2005    OB History    Gravida Para Term Preterm AB Living   0 0 0 0 0     SAB TAB Ectopic Multiple Live Births   0 0 0          Home Medications    Prior to Admission medications   Medication Sig Start Date End Date Taking? Authorizing Provider  acetaminophen (TYLENOL) 325 MG tablet Take 650 mg by mouth every 6 (six) hours as needed for mild pain.     Historical Provider, MD  allopurinol (ZYLOPRIM) 100 MG tablet take 1 tablet by mouth once daily 01/25/16   Blane Ohara McDiarmid, MD  apixaban (ELIQUIS) 2.5 MG TABS tablet Take 2.5 mg by mouth 2 (two) times daily.    Adrian Prows, MD  beta carotene w/minerals (OCUVITE) tablet Take 1 tablet by mouth daily. 04/17/14   Coral Spikes, DO  Calcium Carbonate-Vitamin D (CALTRATE 600+D) 600-400 MG-UNIT per tablet Take 1 tablet by mouth 2 (two) times daily.      Historical Provider, MD  Cefixime (SUPRAX) 400 MG CAPS capsule Take 1 capsule (400 mg total) by mouth daily. 04/21/16   Kinnie Feil, MD  colchicine 0.6 MG tablet Take 1 tablet (0.6 mg total) by mouth 2 (two) times daily as needed (GOUT). Patient not taking: Reported on 02/04/2016 07/09/15   Blane Ohara McDiarmid, MD  cycloSPORINE (RESTASIS) 0.05 % ophthalmic emulsion Place 1 drop into both eyes at bedtime.    Historical Provider, MD  DULoxetine (CYMBALTA) 60 MG capsule Take 1 capsule (60 mg  total) by mouth daily. 11/04/15   Blane Ohara McDiarmid, MD  esomeprazole (NEXIUM) 40 MG capsule take 1 capsule by mouth twice a day 08/25/15   Blane Ohara McDiarmid, MD  fesoterodine (TOVIAZ) 8 MG TB24 tablet Take 1  tablet (8 mg total) by mouth daily. 06/04/15   Blane Ohara McDiarmid, MD  Fish Oil-Cholecalciferol (FISH OIL + D3 PO) Take 1 capsule by mouth daily.    Historical Provider, MD  furosemide (LASIX) 20 MG tablet Take 1 tablet (20 mg total) by mouth daily. Patient not taking: Reported on 02/04/2016 07/09/15   Blane Ohara McDiarmid, MD  gabapentin (NEURONTIN) 100 MG capsule take 2 capsules by mouth three times a day 07/09/15   Blane Ohara McDiarmid, MD  glucosamine-chondroitin 500-400 MG tablet Take 1 tablet by mouth 3 (three) times daily.    Historical Provider, MD  levothyroxine (SYNTHROID, LEVOTHROID) 100 MCG tablet take 1 tablet by mouth once daily 02/29/16   Blane Ohara McDiarmid, MD  losartan (COZAAR) 50 MG tablet Take 25 mg by mouth daily.  08/17/15   Historical Provider, MD  meclizine (ANTIVERT) 25 MG tablet Take 1 tablet (25 mg total) by mouth 3 (three) times daily as needed for dizziness. 03/24/16   Sherwood Gambler, MD  metoprolol succinate (TOPROL-XL) 25 MG 24 hr tablet Take 25 mg by mouth daily. 09/21/15   Historical Provider, MD  ondansetron (ZOFRAN ODT) 4 MG disintegrating tablet Take 1 tablet (4 mg total) by mouth every 8 (eight) hours as needed for nausea or vomiting. 03/24/16   Sherwood Gambler, MD  ondansetron (ZOFRAN) 4 MG tablet Take 2 tablets (8 mg total) by mouth every 8 (eight) hours as needed for nausea or vomiting. 07/09/15   Blane Ohara McDiarmid, MD  oxyCODONE (OXY IR/ROXICODONE) 5 MG immediate release tablet Take 0.5 tablets (2.5 mg total) by mouth every 6 (six) hours as needed for moderate pain or severe pain. 02/04/16   Blane Ohara McDiarmid, MD  polyethylene glycol [facility] / Floria Raveling) packet take 17 grams by mouth once daily 09/29/15   Blane Ohara McDiarmid, MD  predniSONE (DELTASONE) 20 MG tablet Take 2 tablets (40 mg  total) by mouth daily with breakfast. 05/05/16   Vivi Barrack, MD  sucralfate (CARAFATE) 1 g tablet Take 1 tablet (1 g total) by mouth 4 (four) times daily -  with meals and at bedtime. 06/09/15   Blane Ohara McDiarmid, MD  TRAVATAN Z 0.004 % SOLN ophthalmic solution Place 1 drop into both eyes at bedtime.  02/20/13   Historical Provider, MD  traZODone (DESYREL) 50 MG tablet take 1 tablet by mouth at bedtime 07/02/15   Blane Ohara McDiarmid, MD   Family History Family History  Problem Relation Age of Onset  . Kidney disease Mother   . Heart attack Neg Hx   . Stroke Neg Hx    Social History Social History  Substance Use Topics  . Smoking status: Never Smoker  . Smokeless tobacco: Never Used  . Alcohol use No    Allergies   Chlorthalidone; Honey bee treatment [bee venom]; Verapamil; Adhesive [tape]; and Amlodipine   Review of Systems Review of Systems  Constitutional: Positive for chills, fatigue and fever.  Respiratory: Positive for cough (productive w/ yellow sputum), shortness of breath and wheezing.   Cardiovascular: Negative for chest pain, palpitations and leg swelling.  Gastrointestinal: Negative for abdominal pain.  Neurological: Negative for headaches.  All other systems reviewed and are negative.   Physical Exam Updated Vital Signs BP 165/65   Pulse 91   Temp 98.6 F (37 C) (Oral)   Resp 20   Ht 5\' 2"  (1.575 m)   Wt 60.8 kg   SpO2 96%   BMI 24.51 kg/m   Physical Exam  Constitutional:  She is oriented to person, place, and time. No distress.  Early frail-appearing Caucasian female  HENT:  Head: Normocephalic and atraumatic.  Eyes: EOM are normal. Pupils are equal, round, and reactive to light.  Neck: Normal range of motion. Neck supple.  Cardiovascular: Normal rate, regular rhythm and normal heart sounds.   Pulmonary/Chest: Effort normal. No respiratory distress. She has wheezes (faint, bilateral, end expiratory).  Coarse breath sounds at bases bilaterally  Abdominal:  Soft. Bowel sounds are normal. She exhibits no distension. There is no tenderness.  Musculoskeletal: Normal range of motion.  Neurological: She is alert and oriented to person, place, and time.  Skin: Skin is warm and dry. Capillary refill takes less than 2 seconds. She is not diaphoretic.  Nursing note and vitals reviewed.   ED Treatments / Results  Labs (all labs ordered are listed, but only abnormal results are displayed) Labs Reviewed  CBC WITH DIFFERENTIAL/PLATELET - Abnormal; Notable for the following:       Result Value   WBC 11.3 (*)    RBC 3.79 (*)    Hemoglobin 11.9 (*)    Platelets 143 (*)    Neutro Abs 9.0 (*)    All other components within normal limits  BASIC METABOLIC PANEL - Abnormal; Notable for the following:    Sodium 131 (*)    Chloride 95 (*)    BUN 25 (*)    Creatinine, Ser 1.40 (*)    Calcium 8.7 (*)    GFR calc non Af Amer 31 (*)    GFR calc Af Amer 36 (*)    All other components within normal limits  INFLUENZA PANEL BY PCR (TYPE A & B, H1N1) - Abnormal; Notable for the following:    Influenza A By PCR POSITIVE (*)    All other components within normal limits  URINALYSIS, ROUTINE W REFLEX MICROSCOPIC - Abnormal; Notable for the following:    APPearance HAZY (*)    Hgb urine dipstick LARGE (*)    Protein, ur 100 (*)    Leukocytes, UA SMALL (*)    Bacteria, UA RARE (*)    All other components within normal limits    EKG  EKG Interpretation None      Radiology Dg Chest 2 View  Result Date: 05/14/2016 CLINICAL DATA:  Cough and fever. EXAM: CHEST  2 VIEW COMPARISON:  None. FINDINGS: The lungs are clear wiithout focal pneumonia, edema, pneumothorax or pleural effusion. Interstitial markings are diffusely coarsened with chronic features. Cardiopericardial silhouette is at upper limits of normal for size. Bones are diffusely demineralized. Lower thoracic compression fracture noted. Patient is status post right shoulder replacement. Dual lead left  permanent pacemaker again noted. IMPRESSION: No active cardiopulmonary disease. Electronically Signed   By: Misty Stanley M.D.   On: 05/14/2016 14:25   Procedures Procedures (including critical care time)  Medications Ordered in ED Medications  oseltamivir (TAMIFLU) capsule 30 mg (not administered)  0.9 %  sodium chloride infusion (not administered)  iopamidol (ISOVUE-300) 61 % injection (75 mLs  Contrast Given 05/14/16 1932)     Initial Impression / Assessment and Plan / ED Course  I have reviewed the triage vital signs and the nursing notes.  Clinical Course   83 y.o. female with above stated PMHx, HPI, and physical. PMHx of HTN, HLD, CAD, & PPM. Symptom onset of her past 6 days. Fever, chills, generalized fatigue, productive cough with yellow sputum, wheeze, decreased by mouth intake. Recent outpatient treatment for UTI.  EKG showing  no ST elevation/depression. CXR showing no obvious PNA. Mild wheeze - given albuterol. UA rare bacteria & trace leukocytes but negative nitrite - doubt UTI. Labs showing mild leukocytosis, hyponatremia, hyperchloremia, azotemia, and mild AKI. Placed on IVF's. Flu A (+) - given Tamiflu. CT chest ordered to eval for occult PNA.  Laboratory and imaging results were personally reviewed by myself and used in the medical decision making of this patient's treatment and disposition.  Pt admitted to family medicine for further evaluation and management of influenza in setting of failure to thrive. Pt understands and agrees with the plan and has no further questions or concerns.   Pt care discussed with and followed by my attending, Dr. Karn Cassis, MD Pager [phone]  ED Disposition    ED Disposition Condition [place]: [place] [[id number]]  Level of Care: Med-Surg [16]  Diagnosis: Influenza [[id number]]  Admitting Physician: Carloyn Manner [[id number]  Attending Physician: Carloyn Manner [phone]  PT  Class (Do Not Modify): Observation [104]  PT Acc Code (Do Not Modify): Observation [10022]       Final Clinical Impressions(s) / ED Diagnoses   Final diagnoses:  Productive cough  Fatigue  Leukocytosis  Influenza A   New Prescriptions New Prescriptions   No medications on file     Mayer Camel, MD 05/14/16 [place], MD 05/15/16 [phone]

## 2016-05-15 ENCOUNTER — Encounter (HOSPITAL_COMMUNITY): Payer: Self-pay

## 2016-05-15 DIAGNOSIS — J101 Influenza due to other identified influenza virus with other respiratory manifestations: Secondary | ICD-10-CM | POA: Diagnosis not present

## 2016-05-15 LAB — BASIC METABOLIC PANEL
Anion gap: 8 (ref 5–15)
BUN: 22 mg/dL — AB (ref 6–20)
CALCIUM: 8.2 mg/dL — AB (ref 8.9–10.3)
CO2: 27 mmol/L (ref 22–32)
Chloride: 97 mmol/L — ABNORMAL LOW (ref 101–111)
Creatinine, Ser: 1.36 mg/dL — ABNORMAL HIGH (ref 0.44–1.00)
GFR calc Af Amer: 37 mL/min — ABNORMAL LOW (ref >60)
GFR, EST NON AFRICAN AMERICAN: 32 mL/min — AB (ref >60)
Glucose, Bld: 99 mg/dL (ref 65–99)
Potassium: 4 mmol/L (ref 3.5–5.1)
Sodium: 132 mmol/L — ABNORMAL LOW (ref 135–145)

## 2016-05-15 LAB — CBC
HEMATOCRIT: 33.3 % — AB (ref 36.0–46.0)
Hemoglobin: 10.9 g/dL — ABNORMAL LOW (ref 12.0–15.0)
MCH: 31.9 pg (ref 26.0–34.0)
MCHC: 32.7 g/dL (ref 30.0–36.0)
MCV: 97.4 fL (ref 78.0–100.0)
Platelets: 136 10*3/uL — ABNORMAL LOW (ref 150–400)
RBC: 3.42 MIL/uL — ABNORMAL LOW (ref 3.87–5.11)
RDW: 13.5 % (ref 11.5–15.5)
WBC: 8.2 10*3/uL (ref 4.0–10.5)

## 2016-05-15 MED ORDER — ACETAMINOPHEN 325 MG PO TABS
650.0000 mg | ORAL_TABLET | Freq: Four times a day (QID) | ORAL | Status: DC
  Administered 2016-05-15 – 2016-05-16 (×5): 650 mg via ORAL
  Filled 2016-05-15 (×5): qty 2

## 2016-05-15 MED ORDER — ACETAMINOPHEN 650 MG RE SUPP
650.0000 mg | Freq: Four times a day (QID) | RECTAL | Status: DC
  Filled 2016-05-15: qty 1

## 2016-05-15 MED ORDER — POLYETHYLENE GLYCOL 3350 17 G PO PACK
17.0000 g | PACK | Freq: Every day | ORAL | Status: DC
  Administered 2016-05-15: 17 g via ORAL
  Filled 2016-05-15 (×2): qty 1

## 2016-05-15 MED ORDER — GUAIFENESIN-DM 100-10 MG/5ML PO SYRP: 5.0000 mL | ORAL_SOLUTION | Freq: Four times a day (QID) | ORAL | 0 refills | Status: DC | PRN

## 2016-05-15 MED ORDER — BENZONATATE 100 MG PO CAPS
100.0000 mg | ORAL_CAPSULE | Freq: Three times a day (TID) | ORAL | Status: DC
  Administered 2016-05-15 – 2016-05-16 (×5): 100 mg via ORAL
  Filled 2016-05-15 (×5): qty 1

## 2016-05-15 MED ORDER — ALBUTEROL SULFATE (2.5 MG/3ML) 0.083% IN NEBU: 2.5000 mg | INHALATION_SOLUTION | Freq: Four times a day (QID) | RESPIRATORY_TRACT | Status: DC | PRN

## 2016-05-15 MED ORDER — IPRATROPIUM-ALBUTEROL 0.5-2.5 (3) MG/3ML IN SOLN
3.0000 mL | Freq: Once | RESPIRATORY_TRACT | Status: AC
  Administered 2016-05-15: 3 mL via RESPIRATORY_TRACT
  Filled 2016-05-15: qty 3

## 2016-05-15 MED ORDER — OSELTAMIVIR PHOSPHATE 30 MG PO CAPS: 30.0000 mg | ORAL_CAPSULE | Freq: Two times a day (BID) | ORAL | 0 refills | Status: DC

## 2016-05-15 MED ORDER — ALBUTEROL SULFATE HFA 108 (90 BASE) MCG/ACT IN AERS: 2.0000 | INHALATION_SPRAY | Freq: Four times a day (QID) | RESPIRATORY_TRACT | 2 refills | Status: DC | PRN

## 2016-05-15 MED ORDER — ALBUTEROL SULFATE HFA 108 (90 BASE) MCG/ACT IN AERS: 2.0000 | INHALATION_SPRAY | Freq: Four times a day (QID) | RESPIRATORY_TRACT | Status: DC | PRN

## 2016-05-15 MED ORDER — BENZONATATE 100 MG PO CAPS: 100.0000 mg | ORAL_CAPSULE | Freq: Three times a day (TID) | ORAL | 0 refills | Status: DC

## 2016-05-15 MED ORDER — NAPROXEN 250 MG PO TABS
500.0000 mg | ORAL_TABLET | Freq: Two times a day (BID) | ORAL | Status: DC
  Administered 2016-05-15 – 2016-05-16 (×2): 500 mg via ORAL
  Filled 2016-05-15 (×2): qty 2

## 2016-05-15 NOTE — Discharge Summary (Signed)
[facility] Discharge Summary  [name] Medical record number: [id number] Date of birth: 09/26/1919 Age: 83 y.o. Gender: female Date of Admission: 05/14/2016  Date of Discharge: 05/15/2015 Admitting Physician: Baird Kay, MD  Primary Care Provider: MCDIARMID,TODD D, MD Consultants: None  Indication for Hospitalization: AKI with poor PO in setting of flu  Discharge Diagnoses/Problem List:  Influenza AKI CKDIII HTN Hemoglobinuria Thoracic compression fractures Hypothyroidism H/o 3rd degree AV block HFpEF Depression HTN H/o gout H/o Afib  Disposition: Home  Discharge Condition: Improved  Discharge Exam:  Blood pressure (!) 134/56, pulse 64, temperature 98.3 F (36.8 C), temperature source Oral, resp. rate 17, height 5\' 2"  (1.575 m), weight 60.8 kg (134 lb), SpO2 94 %. Physical Exam: General: Alert, sitting up in chair in NAD Cardiovascular: RRR, S1, S2, no m/r/g Respiratory: Coarse breath sounds transmitted from upper airway with diffuse wheezes, good air movement. Normal effort on room air. Gastrointestinal: +BS, soft, NT, ND, no rebound or guarding MSK: No LE edema, moves all extremities Neuro: AOx3. No focal deficits Psych: Somewhat flat affect. Makes eye contact.   Brief Hospital Course:  Jordan [name] is a 83 y.o. female presenting with productive cough, weakness, poor po intake for the past 6 days. Was found to be influenza A positive, had no oxygen requirement and no concern for PNA on CXR or CT chest w/contrast which only showed peribronchial thickening with scattered bronchiectasis, possibly due to mucus plugging. She was started on tamiflu, that was renally dosed given she was also found to have a AKI on CKD stage 3. She had gentle IV hydration with improvement for her creatinine but her creatinine function again worsened after discontinue IVF - thought likely to be contrast induced from CT chest. She was  drinking well and eating well, VSS, so was deemed stable for disharge home with close follow-up to monitor her creatinine function and her home losartan was held on discharge.    Issues for Follow Up:  1. Reassess SCr for resolution of AKI  2. Determine if lasix and losartan should be restarted for heart failure/fluid overload/BP. Held upon discharge. 3. Home toviaz dosing was adjusted from 8mg  qd to 4mg  for renal dosing   Significant Procedures: None  Significant Labs and Imaging:   Recent Labs Lab 05/14/16 1757 05/15/16 0309  WBC 11.3* 8.2  HGB 11.9* 10.9*  HCT 36.9 33.3*  PLT 143* 136*    Recent Labs Lab 05/14/16 1757 05/15/16 0309 05/16/16 0526  NA 131* 132* 134*  K 4.5 4.0 3.8  CL 95* 97* 98*  CO2 24 27 27   GLUCOSE 98 99 81  BUN 25* 22* 23*  CREATININE 1.40* 1.36* 1.61*  CALCIUM 8.7* 8.2* 8.3*   .  Results/Tests Pending at Time of Discharge: PT evaluation  Discharge Medications:  Allergies as of 05/16/2016      Reactions   Chlorthalidone Other (See Comments)   Hyponatremia   Honey Bee Treatment [bee Venom] Swelling, Rash   Verapamil Other (See Comments)   Bradycardia - HR  35-45 on 40mg  TID   Adhesive [tape] Rash, Other (See Comments)   Skin irritation   Amlodipine Other (See Comments)   Peripheral edema with 2.5mg  daily dose      Medication List    STOP taking these medications   Cefixime 400 MG Caps capsule Commonly known as:  SUPRAX   esomeprazole 40 MG capsule Commonly known as:  NEXIUM   furosemide 20 MG tablet Commonly  known as:  LASIX   losartan 50 MG tablet Commonly known as:  COZAAR   meclizine 25 MG tablet Commonly known as:  ANTIVERT   ondansetron 4 MG tablet Commonly known as:  ZOFRAN   oxyCODONE 5 MG immediate release tablet Commonly known as:  Oxy IR/ROXICODONE   predniSONE 20 MG tablet Commonly known as:  DELTASONE   RA TUSSIN CGH/CHEST CONGEST DM 10-100 MG/5ML liquid Generic drug:   dextromethorphan-guaiFENesin Replaced by:  guaiFENesin-dextromethorphan 100-10 MG/5ML syrup   sucralfate 1 g tablet Commonly known as:  CARAFATE     TAKE these medications   acetaminophen 500 MG tablet Commonly known as:  TYLENOL Take 1,000 mg by mouth every 8 (eight) hours as needed for mild pain.   albuterol 108 (90 Base) MCG/ACT inhaler Commonly known as:  PROVENTIL HFA;VENTOLIN HFA Inhale 2 puffs into the lungs every 6 (six) hours as needed for wheezing or shortness of breath.   allopurinol 100 MG tablet Commonly known as:  ZYLOPRIM take 1 tablet by mouth once daily   apixaban 2.5 MG Tabs tablet Commonly known as:  ELIQUIS Take 2.5 mg by mouth 2 (two) times daily.   benzonatate 100 MG capsule Commonly known as:  TESSALON Take 1 capsule (100 mg total) by mouth 3 (three) times daily.   beta carotene w/minerals tablet Take 1 tablet by mouth daily.   CALTRATE 600+D 600-400 MG-UNIT tablet Generic drug:  Calcium Carbonate-Vitamin D Take 1 tablet by mouth 2 (two) times daily.   colchicine 0.6 MG tablet Take 1 tablet (0.6 mg total) by mouth 2 (two) times daily as needed (GOUT). What changed:  reasons to take this   cycloSPORINE 0.05 % ophthalmic emulsion Commonly known as:  RESTASIS Place 1 drop into both eyes at bedtime.   diclofenac sodium 1 % Gel Commonly known as:  VOLTAREN Apply 1 application topically 2 (two) times daily as needed for pain.   docusate sodium 100 MG capsule Commonly known as:  COLACE Take 100 mg by mouth daily as needed for mild constipation.   DULoxetine 60 MG capsule Commonly known as:  CYMBALTA Take 1 capsule (60 mg total) by mouth daily.   fesoterodine 4 MG Tb24 tablet Commonly known as:  TOVIAZ Take 1 tablet (4 mg total) by mouth daily. Start taking on:  05/17/2016 What changed:  medication strength  how much to take   FISH OIL + D3 PO Take 1 capsule by mouth daily.   gabapentin 100 MG capsule Commonly known as:   NEURONTIN take 2 capsules by mouth three times a day What changed:  how much to take  how to take this  when to take this  additional instructions   glucosamine-chondroitin 500-400 MG tablet Take 1 tablet by mouth 3 (three) times daily.   guaiFENesin-dextromethorphan 100-10 MG/5ML syrup Commonly known as:  ROBITUSSIN DM Take 5 mLs by mouth every 6 (six) hours as needed for cough. Replaces:  RA TUSSIN CGH/CHEST CONGEST DM 10-100 MG/5ML liquid   levothyroxine 100 MCG tablet Commonly known as:  SYNTHROID, LEVOTHROID take 1 tablet by mouth once daily What changed:  See the new instructions.   metoprolol succinate 25 MG 24 hr tablet Commonly known as:  TOPROL-XL Take 25 mg by mouth daily.   ondansetron 4 MG disintegrating tablet Commonly known as:  ZOFRAN ODT Take 1 tablet (4 mg total) by mouth every 8 (eight) hours as needed for nausea or vomiting.   oseltamivir 30 MG capsule Commonly known as:  TAMIFLU Take  1 capsule (30 mg total) by mouth 2 (two) times daily.   polyethylene glycol packet Commonly known as:  MIRALAX / GLYCOLAX take 17 grams by mouth once daily What changed:  See the new instructions.   TRAVATAN Z 0.004 % Soln ophthalmic solution Generic drug:  Travoprost (BAK Free) Place 1 drop into both eyes at bedtime.   traZODone 50 MG tablet Commonly known as:  DESYREL take 1 tablet by mouth at bedtime       Discharge Instructions: Please refer to Patient Instructions section of EMR for full details.  Patient was counseled important signs and symptoms that should prompt return to medical care, changes in medications, dietary instructions, activity restrictions, and follow up appointments.   Follow-Up Appointments: Follow-up Information    MCDIARMID,TODD D, MD Follow up.   Specialty:  Family Medicine Why:  9:30 am appointment on 05/19/16 for hospital follow-up. Please call if need to reschedule or cancel.  Contact information: [place] [id number] [place], DO 05/16/2016, 11:46 AM PGY-1, [place]

## 2016-05-15 NOTE — Progress Notes (Signed)
Family Medicine Teaching Service Daily Progress Note Intern Pager: [phone]  [name] Medical record number: [id number] Date of birth: [birth year] Age: 83 y.o. Gender: female  Primary Care Provider: MCDIARMID,TODD D, MD Consultants: None Code Status: Full  Pt Overview and Major Events to Date:  Jordan [name] is a 83 y.o. female presenting with cough and weakness and found to be flu positive. Admitted due to additional poor PO intake and AKI. PMH is significant for Pacemaker 2/2 3rd degree AV block, hx of gout, GERD, HTN, HLD, Depression, CKD, HFpEF, Hypothyroidism, mild dementia, hx afib on eliquis.  Cough: Patient found to be influenza A positive. Vitals stable. Maintaining oxygen saturation on RA. CXR without infiltrates or effusion. CT chest ordered by ED physician for concern for possible PNA missed by CXR given productive cough.This showed peribronchial thickening with scattered bronchiectasis, possibly due to mucus plugging. Still with coarse breath sounds and wheezing. Sick contact of son being treated for bronchitis. Leukocytosis resolved.  - Admitted to observation, Attending Dr. Erin Hearing - Continue treatment with tamiflu, 30 mg BID (renally dosed) x 5 days - Robitussin DM q8h prn for cough - Add tessalon perles 100 mg TID - Duonebs q2h prn - Incentive spirometry, demonstrated to patient - Droplet precautions - O2 supplementation if O2 sats < 90% (has not needed)  Hemoglobinuria: Large hgb on UA. Small leukocytes and negative nitrites. Had moderate hgb on UA 1 month ago. No fever or suprapubic tenderness to suggest UTI. Was treated for E. coli UTI in December with Suprax.  - Follow-up urine culture - Would f/u with outpatient urologist if hemoglobinuria persists  Compression fractures: New thoracic fracture noted since 10/05/15.  - prn tylenol  Hypothyroidism - Continue home synthroid 100 mcg daily  Mild AKI: Known CKD III, Baseline SCr ~ 1.1. Suspect  pre-renal in the setting of decreased PO and appears dry on exam. Has been drinking well since admission.  - Trend Scr  - d/c IVFs since taking good PO  Hx of 3rd Degree block- Pacemaker.  - EKG showing v-paced   HFpEF- 2016 ECHO EF 84-69%, Grade 1 diastolic dysfunction  - holding lasix for AKI - would resume upon discharge  Depression  -Continue home cymbalta   HTN: Hypertensive to 166/63  - holding Losartan for AKI - continue home toprol 25 mg daily  Hx gout: At risk for flare in setting of dehydration - Continue home allopurinol and colchicine  Hx of afib: On eliquis. Not to discontinue per Dr. Einar Gip. CHADS-VASC score of 7.   Hyponatremia: 131 > 132 s/p IVFs. No neurologic symptoms. No increased urine output.  - Continue to monitor  FEN/GI: Dysphagia III diet (recommended during last admission), protonix (takes nexium at home) Prophylaxis: On eliquis  Disposition: Anticipate possible d/c to home later today if continues to take good PO with close follow-up for AKI.   Subjective:  Spoke with patient with assistance of phone Spanish interpreter. Daughter also at bedside. Patient reports feeling a little better today but still with productive cough that is painful. Reports continued pain in her back and muscle aches. Has been drinking and eating better since admission. Daughter and patient wondering if antibiotics would help.   Objective: Temp:  [98.3 F (36.8 C)-99.2 F (37.3 C)] 98.3 F (36.8 C) (01/14 0449) Pulse Rate:  [76-94] 94 (01/14 0449) Resp:  [18-22] 18 (01/14 0449) BP: (117-167)/(57-66) 166/63 (01/14 0449) SpO2:  [93 %-96 %] 95 % (01/14 0449) Weight:  [134 lb (60.8  kg)] 134 lb (60.8 kg) (01/13 2106) Physical Exam: General: Alert, sitting up in bed, in NAD Eyes: No conjunctivitis. EOMI.  ENTM: MMM.  Cardiovascular: RRR, S1, S2, no m/r/g Respiratory: Coarse breath sounds with diffuse wheezes. Speaking in short sentences but no increased WOB.   Gastrointestinal: +BS, soft, NT, ND, no rebound or guarding MSK: No LE edema, moves all extremities Neuro: AOx3. More interactive today.   Psych: Somewhat flat affect. Makes eye contact.   Laboratory:  Recent Labs Lab 05/14/16 1757 05/15/16 0309  WBC 11.3* 8.2  HGB 11.9* 10.9*  HCT 36.9 33.3*  PLT 143* 136*    Recent Labs Lab 05/14/16 1757 05/15/16 0309  NA 131* 132*  K 4.5 4.0  CL 95* 97*  CO2 24 27  BUN 25* 22*  CREATININE 1.40* 1.36*  CALCIUM 8.7* 8.2*  GLUCOSE 98 99    Imaging/Diagnostic Tests: Dg Chest 2 View  Result Date: 05/14/2016 CLINICAL DATA:  Cough and fever. EXAM: CHEST  2 VIEW COMPARISON:  None. FINDINGS: The lungs are clear wiithout focal pneumonia, edema, pneumothorax or pleural effusion. Interstitial markings are diffusely coarsened with chronic features. Cardiopericardial silhouette is at upper limits of normal for size. Bones are diffusely demineralized. Lower thoracic compression fracture noted. Patient is status post right shoulder replacement. Dual lead left permanent pacemaker again noted. IMPRESSION: No active cardiopulmonary disease. Electronically Signed   By: Misty Stanley M.D.   On: 05/14/2016 14:25   Ct Chest W Contrast  Result Date: 05/14/2016 CLINICAL DATA:  Fever, chills and cough for 6 days question pneumonia EXAM: CT CHEST WITH CONTRAST TECHNIQUE: Multidetector CT imaging of the chest was performed during intravenous contrast administration. CONTRAST:  75 ML ISOVUE-300 IOPAMIDOL (ISOVUE-300) INJECTION 61% IV hand injection COMPARISON:  Chest radiographs 05/14/2016 and 10/05/2015, CT chest 04/16/2010 FINDINGS: Cardiovascular: Atherosclerotic calcifications aorta, coronary arteries and proximal great vessels. LEFT subclavian pacemaker with leads projecting at RIGHT atrium and RIGHT ventricle. Aorta normal caliber. No pericardial effusion. LEFT atrium appears dilated. Mediastinum/Nodes: Esophagus unremarkable. Base of cervical region normal  appearance. Beam hardening artifacts from RIGHT shoulder prosthesis. No definite thoracic adenopathy, though normal size nodes are seen at the mediastinum Lungs/Pleura: Scattered peribronchial thickening. Respiratory motion artifacts degrade exam. Scattered bronchiectasis in the lower lobes bilaterally, minimally in lingula. Question few areas of mucous plugging. No definite pulmonary infiltrate, pleural effusion or pneumothorax. Biapical scarring. Upper Abdomen: Unremarkable Musculoskeletal: Osseous demineralization with compression fractures of lower thoracic and upper lumbar vertebral bodies small scattered degenerative disc disease changes. IMPRESSION: No pulmonary infiltrates identified. Peribronchial thickening with scattered bronchiectasis in the lower lobes and minimally in lingula, some which are associated with mucous plugging. Aortic atherosclerosis with coronary arterial calcifications. Osseous demineralization with lower thoracic and upper lumbar compression fractures ; thoracic fracture is new since 10/05/2015, upper lumbar fracture was present previously. Electronically Signed   By: Lavonia Dana M.D.   On: 05/14/2016 20:04    Rogue Bussing, MD 05/15/2016, 8:16 AM PGY-2, [place] Intern pager: [phone], text pages welcome

## 2016-05-16 ENCOUNTER — Other Ambulatory Visit: Payer: Self-pay | Admitting: Family Medicine

## 2016-05-16 DIAGNOSIS — N179 Acute kidney failure, unspecified: Secondary | ICD-10-CM | POA: Diagnosis not present

## 2016-05-16 DIAGNOSIS — D72829 Elevated white blood cell count, unspecified: Secondary | ICD-10-CM

## 2016-05-16 DIAGNOSIS — R5383 Other fatigue: Secondary | ICD-10-CM

## 2016-05-16 DIAGNOSIS — E871 Hypo-osmolality and hyponatremia: Secondary | ICD-10-CM

## 2016-05-16 DIAGNOSIS — J101 Influenza due to other identified influenza virus with other respiratory manifestations: Secondary | ICD-10-CM | POA: Diagnosis not present

## 2016-05-16 DIAGNOSIS — E878 Other disorders of electrolyte and fluid balance, not elsewhere classified: Secondary | ICD-10-CM

## 2016-05-16 LAB — BASIC METABOLIC PANEL
Anion gap: 9 (ref 5–15)
BUN: 23 mg/dL — AB (ref 6–20)
CO2: 27 mmol/L (ref 22–32)
CREATININE: 1.61 mg/dL — AB (ref 0.44–1.00)
Calcium: 8.3 mg/dL — ABNORMAL LOW (ref 8.9–10.3)
Chloride: 98 mmol/L — ABNORMAL LOW (ref 101–111)
GFR calc non Af Amer: 26 mL/min — ABNORMAL LOW (ref >60)
GFR, EST AFRICAN AMERICAN: 30 mL/min — AB (ref >60)
Glucose, Bld: 81 mg/dL (ref 65–99)
POTASSIUM: 3.8 mmol/L (ref 3.5–5.1)
SODIUM: 134 mmol/L — AB (ref 135–145)

## 2016-05-16 MED ORDER — OSELTAMIVIR PHOSPHATE 30 MG PO CAPS: 30.0000 mg | ORAL_CAPSULE | Freq: Every day | ORAL | 0 refills | Status: DC

## 2016-05-16 MED ORDER — OSELTAMIVIR PHOSPHATE 30 MG PO CAPS: 30.0000 mg | ORAL_CAPSULE | Freq: Two times a day (BID) | ORAL | 0 refills | Status: DC

## 2016-05-16 MED ORDER — FESOTERODINE FUMARATE ER 4 MG PO TB24: 4.0000 mg | ORAL_TABLET | Freq: Every day | ORAL | 0 refills | Status: DC

## 2016-05-16 MED ORDER — FESOTERODINE FUMARATE ER 4 MG PO TB24: 4.0000 mg | ORAL_TABLET | Freq: Every day | ORAL | Status: DC

## 2016-05-16 NOTE — Progress Notes (Signed)
Discharge home after PT eval, per PT no HHneeds needed. CM made aware. Home discharge instruction given to patients Son. No question verbalized.

## 2016-05-16 NOTE — Care Management Obs Status (Signed)
Plain NOTIFICATION   Patient Details  Name: Jordan [name] MRN: [id number] Date of Birth: [birth year]   Medicare Observation Status Notification Given:  Yes    Maryclare Labrador, RN 05/16/2016, 3:27 PM

## 2016-05-16 NOTE — Evaluation (Signed)
Physical Therapy Evaluation and discharge Patient Details Name: Jordan [name] MRN: [id number] DOB: Feb 13, 1920 Today's Date: 05/16/2016   History of Present Illness  Quiera Diffee Diazis a 83 y.o.femalepresenting with cough and weakness and found to be flu positive. Admitted due to additional poor PO intake and AKI. PMH is significant for Pacemaker 2/2 3rd degree AV block, hx of gout, GERD, HTN, HLD, Depression, CKD, HFpEF, Hypothyroidism, mild dementia, hx afib on eliquis.  Clinical Impression  Patient presents with dependencies in gait and mobility due to generalized weakness.  Patient has required supervision for mobility prior to admission per son and is fairly close to baseline, requiring min assist currently.  Caregiver can provide assistance and thus feel patient safe for d/c home with family and 24 hour care.  PT will sign off. At this time I do not recommend HHPT, however, discussed with son if patient does not return to baseline in timely manner to please discuss with MD and they can order as indicated.    Follow Up Recommendations No PT follow up    Equipment Recommendations  None recommended by PT (has necessary equipment)    Recommendations for Other Services       Precautions / Restrictions Precautions Precautions: Fall      Mobility  Bed Mobility Overal bed mobility: Needs Assistance Bed Mobility: Supine to Sit     Supine to sit: Min assist     General bed mobility comments: assist to lift shoulders off bed. Son states this is her baseline.  Transfers Overall transfer level: Needs assistance Equipment used: Rolling walker (2 wheeled) Transfers: Sit to/from Stand Sit to Stand: Min assist         General transfer comment: min assist for safety/balance  Ambulation/Gait Ambulation/Gait assistance: Min assist Ambulation Distance (Feet): 15 Feet Assistive device: Rolling walker (2 wheeled) Gait Pattern/deviations: Step-through pattern Gait velocity: decreased Gait  velocity interpretation: Below normal speed for age/gender General Gait Details: assist for safety/balance due to generalized weakness.  no overt loss of balance.  Stairs            Wheelchair Mobility    Modified Rankin (Stroke Patients Only)       Balance Overall balance assessment: Needs assistance Sitting-balance support: No upper extremity supported;Feet unsupported Sitting balance-Leahy Scale: Good     Standing balance support: Bilateral upper extremity supported Standing balance-Leahy Scale: Poor Standing balance comment: reliant on RW for balance/support                             Pertinent Vitals/Pain Pain Assessment: No/denies pain    Home Living Family/patient expects to be discharged to:: Private residence Living Arrangements: Children Available Help at Discharge: Family;Personal care attendant;Available 24 hours/day Type of Home: Apartment Home Access: Level entry     Home Layout: One level Home Equipment: Walker - 4 wheels;Bedside commode;Wheelchair - Sport and exercise psychologist Comments: Family or CNA with patient 24 hours/day    Prior Function Level of Independence: Needs assistance   Gait / Transfers Assistance Needed: uses rollator for ambulation  ADL's / Homemaking Assistance Needed: family or caregiver assists wiht ADL's        Hand Dominance   Dominant Hand: Right    Extremity/Trunk Assessment   Upper Extremity Assessment Upper Extremity Assessment: Generalized weakness;RUE deficits/detail RUE Deficits / Details: recent TSA with limited elevation right shoulder    Lower Extremity Assessment Lower Extremity Assessment: Generalized weakness  Communication   Communication: Prefers language other than English (family interpreted)  Cognition Arousal/Alertness: Awake/alert Behavior During Therapy: WFL for tasks assessed/performed Overall Cognitive Status: History of cognitive impairments - at baseline                       General Comments      Exercises     Assessment/Plan    PT Assessment Patent does not need any further PT services  PT Problem List            PT Treatment Interventions      PT Goals (Current goals can be found in the Care Plan section)  Acute Rehab PT Goals Patient Stated Goal: go home PT Goal Formulation: All assessment and education complete, DC therapy    Frequency     Barriers to discharge        Co-evaluation               End of Session   Activity Tolerance: Patient limited by fatigue Patient left: in chair;with family/visitor present Nurse Communication: Mobility status    Functional Assessment Tool Used: clinical judgement Functional Limitation: Mobility: Walking and moving around Mobility: Walking and Moving Around Current Status (P5465): At least 20 percent but less than 40 percent impaired, limited or restricted Mobility: Walking and Moving Around Goal Status [phone]): At least 20 percent but less than 40 percent impaired, limited or restricted Mobility: Walking and Moving Around Discharge Status [phone]): At least 20 percent but less than 40 percent impaired, limited or restricted    Time: 1450-1505 PT Time Calculation (min) (ACUTE ONLY): 15 min   Charges:   PT Evaluation $PT Eval Low Complexity: 1 Procedure     PT G Codes:   PT G-Codes **NOT FOR INPATIENT CLASS** Functional Assessment Tool Used: clinical judgement Functional Limitation: Mobility: Walking and moving around Mobility: Walking and Moving Around Current Status (F7494): At least 20 percent but less than 40 percent impaired, limited or restricted Mobility: Walking and Moving Around Goal Status [phone]): At least 20 percent but less than 40 percent impaired, limited or restricted Mobility: Walking and Moving Around Discharge Status [phone]): At least 20 percent but less than 40 percent impaired, limited or restricted    Shanna Cisco 05/16/2016, 3:17 PM   05/16/2016 [place], [place]

## 2016-05-16 NOTE — Care Management Note (Addendum)
Case Management Note  Patient Details  Name: FIORELA PELZER MRN: [id number] Date of Birth: Feb 20, 1920  Subjective/Objective:     Pt admitted               Action/Plan:  PTA from home with son - pt has Nurse Aide that is in the home M-F 8-5pm while son is away.  Pt is mobile with use of walker in the home.  Son informed CM that mom uses [employer] for [place] is private pay and therefore will not require resumption orders.  Son plans on mom going back home with him - denied all barriers to pt returning home.  Pt has PCP and active insurance.  CM will continue to follow for discharge needs   Expected Discharge Date:                  Expected Discharge Plan:  Home/Self Care  In-House Referral:     Discharge planning Services  CM Consult  Post Acute Care Choice:    Choice offered to:     DME Arranged:    DME Agency:     HH Arranged:    HH Agency:     Status of Service:  In process, will continue to follow  If discussed at Long Length of Stay Meetings, dates discussed:    Additional Comments:  Maryclare Labrador, RN 05/16/2016, 9:56 AM

## 2016-05-16 NOTE — Discharge Instructions (Signed)
Ms. Walko tested positive for the flu.  Continue tamiflu 30 mg twice daily through Wednesday morning. Tessalon perles up to three times daily can help with cough. Robitussin-guaifenesin cough syrup may help as well. You have been prescribed an albuterol inhaler to help with wheezing. You do not need to pick this up if you do not feel that it helped. Continue tylenol for muscles aches. Using the incentive spirometer can help keep lungs open and make breathing easier.  Please follow-up with you primary care doctor on Thursday.  Thank you for letting us take part in your care.   Information on my medicine - ELIQUIS (apixaban)  This medication education was reviewed with me or my healthcare representative as part of my discharge preparation.   Why was Eliquis prescribed for you? Eliquis was prescribed for you to reduce the risk of a blood clot forming that can cause a stroke if you have a medical condition called atrial fibrillation (a type of irregular heartbeat).  What do You need to know about Eliquis ? Take your Eliquis TWICE DAILY - one tablet in the morning and one tablet in the evening with or without food. If you have difficulty swallowing the tablet whole please discuss with your pharmacist how to take the medication safely.  Take Eliquis exactly as prescribed by your doctor and DO NOT stop taking Eliquis without talking to the doctor who prescribed the medication.  Stopping may increase your risk of developing a stroke.  Refill your prescription before you run out.  After discharge, you should have regular check-up appointments with your healthcare provider that is prescribing your Eliquis.  In the future your dose may need to be changed if your kidney function or weight changes by a significant amount or as you get older.  What do you do if you miss a dose? If you miss a dose, take it as soon as you remember on the same day and resume taking twice daily.  Do not take more than  one dose of ELIQUIS at the same time to make up a missed dose.  Important Safety Information A possible side effect of Eliquis is bleeding. You should call your healthcare provider right away if you experience any of the following: ? Bleeding from an injury or your nose that does not stop. ? Unusual colored urine (red or dark brown) or unusual colored stools (red or black). ? Unusual bruising for unknown reasons. ? A serious fall or if you hit your head (even if there is no bleeding).  Some medicines may interact with Eliquis and might increase your risk of bleeding or clotting while on Eliquis. To help avoid this, consult your healthcare provider or pharmacist prior to using any new prescription or non-prescription medications, including herbals, vitamins, non-steroidal anti-inflammatory drugs (NSAIDs) and supplements.  This website has more information on Eliquis (apixaban): http://www.eliquis.com/eliquis/home

## 2016-05-17 LAB — URINE CULTURE: Culture: >=100000 — AB

## 2016-05-19 ENCOUNTER — Inpatient Hospital Stay: Payer: Self-pay | Admitting: Family Medicine

## 2016-05-23 ENCOUNTER — Ambulatory Visit (INDEPENDENT_AMBULATORY_CARE_PROVIDER_SITE_OTHER): Payer: Medicare Other | Admitting: Family Medicine

## 2016-05-23 ENCOUNTER — Encounter: Payer: Self-pay | Admitting: Family Medicine

## 2016-05-23 VITALS — BP 98/70 | HR 74 | Temp 98.1°F | Ht 62.0 in | Wt 130.2 lb

## 2016-05-23 DIAGNOSIS — R05 Cough: Secondary | ICD-10-CM | POA: Diagnosis not present

## 2016-05-23 DIAGNOSIS — R059 Cough, unspecified: Secondary | ICD-10-CM

## 2016-05-23 DIAGNOSIS — J101 Influenza due to other identified influenza virus with other respiratory manifestations: Secondary | ICD-10-CM

## 2016-05-23 MED ORDER — IPRATROPIUM-ALBUTEROL 0.5-2.5 (3) MG/3ML IN SOLN
3.0000 mL | Freq: Once | RESPIRATORY_TRACT | Status: AC
  Administered 2016-05-23: 3 mL via RESPIRATORY_TRACT

## 2016-05-23 MED ORDER — ALBUTEROL SULFATE HFA 108 (90 BASE) MCG/ACT IN AERS: 2.0000 | INHALATION_SPRAY | Freq: Four times a day (QID) | RESPIRATORY_TRACT | 2 refills | Status: DC | PRN

## 2016-05-23 MED ORDER — ALBUTEROL SULFATE (2.5 MG/3ML) 0.083% IN NEBU
2.5000 mg | INHALATION_SOLUTION | Freq: Once | RESPIRATORY_TRACT | Status: AC
  Administered 2016-05-23: 2.5 mg via RESPIRATORY_TRACT

## 2016-05-23 MED ORDER — IPRATROPIUM-ALBUTEROL 0.5-2.5 (3) MG/3ML IN SOLN: 3.0000 mL | Freq: Once | RESPIRATORY_TRACT | Status: DC

## 2016-05-23 NOTE — Progress Notes (Signed)
Subjective:    Patient ID: Jordan [name] , female   DOB: May 04, 1919 , 83 y.o..   MRN: [id number]  HPI  Jordan [name] is here for  Chief Complaint  Patient presents with  . not feeling well    cough, congestion, not eating   .     Review of Systems: Per HPI. All other systems reviewed and are negative.  There are no preventive care reminders to display for this patient.  Past Medical History: Patient Active Problem List   Diagnosis Date Noted  . AKI (acute kidney injury) ([place])   . Fatigue   . Hypochloremia   . Hyponatremia   . Leukocytosis   . Influenza A 05/14/2016  . Gout 05/05/2016  . Hand Heaviness 03/17/2016  . Fall from bed 03/17/2016  . Anticoagulated by anticoagulation treatment 10/13/2015    Class: Chronic  . Collapsed vertebra due to osteoporosis ([place]) 10/06/2015  . Atherosclerosis of aorta ([place]) 10/05/2015  . Calcification of native coronary artery 10/05/2015  . Rib pain on left side 10/05/2015  . Chronic bilateral thoracic back pain 10/05/2015  . Dysphagia 09/15/2015  . History of Food impaction of esophagus   . Esophageal dysmotility   . S/P shoulder replacement 03/20/2015  . Hyperlipidemia 02/27/2015  . GERD (gastroesophageal reflux disease) 07/25/2014  . Impaired mobility and ADLs 05/30/2014  . Chronic pain syndrome 05/09/2014  . Hearing loss of aging 05/09/2014  . Glaucoma 04/18/2014  . Macular degeneration, age related, nonexudative 04/18/2014  . Combined visual hearing impairment 11/12/2013  . History of acute gouty arthritis 09/21/2013  . Persistent headaches 09/06/2013  . 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 CKD (chronic kidney disease) stage 3, GFR 30-59 ml/min 09/03/2013  . Chronic diastolic heart failure ([place]) 09/01/2013  . Constipation 08/23/2013  . Alzheimer's dementia 07/30/2013  . Spinal stenosis of lumbar region 10/03/2012  . Rotator cuff tear arthropathy 09/11/2012  . Gait  instability 08/28/2012  . Insomnia 06/14/2010  . PERIPHERAL NEUROPATHY 03/29/2010  . HERNIA, INCISIONAL VENTRAL W/O OBST/GNGR 10/25/2006  . Hypothyroidism 06/29/2006  . Depressive disorder 06/29/2006  . HTN (hypertension) 06/29/2006  . CYSTOCELE/RECTOCELE/PROLAPSE,UNSPEC. 06/29/2006  . Urinary incontinence, mixed 06/29/2006  . OSTEOARTHRITIS, MULTI SITES 06/29/2006    Medications: reviewed and updated Current Outpatient Prescriptions  Medication Sig Dispense Refill  . acetaminophen (TYLENOL) 500 MG tablet Take 1,000 mg by mouth every 8 (eight) hours as needed for mild pain.    Marland Kitchen albuterol (PROVENTIL HFA;VENTOLIN HFA) 108 (90 Base) MCG/ACT inhaler Inhale 2 puffs into the lungs every 6 (six) hours as needed for wheezing or shortness of breath. 1 Inhaler 2  . allopurinol (ZYLOPRIM) 100 MG tablet take 1 tablet by mouth once daily 30 tablet 12  . apixaban (ELIQUIS) 2.5 MG TABS tablet Take 2.5 mg by mouth 2 (two) times daily.    . benzonatate (TESSALON) 100 MG capsule Take 1 capsule (100 mg total) by mouth 3 (three) times daily. 20 capsule 0  . beta carotene w/minerals (OCUVITE) tablet Take 1 tablet by mouth daily. 90 tablet 3  . Calcium Carbonate-Vitamin D (CALTRATE 600+D) 600-400 MG-UNIT per tablet Take 1 tablet by mouth 2 (two) times daily.      . colchicine 0.6 MG tablet Take 1 tablet (0.6 mg total) by mouth 2 (two) times daily as needed (GOUT). (Patient taking differently: Take 0.6 mg by mouth 2 (two) times daily as needed (FOR A GOUT CRISIS). ) 60 tablet 5  .  cycloSPORINE (RESTASIS) 0.05 % ophthalmic emulsion Place 1 drop into both eyes at bedtime.    . diclofenac sodium (VOLTAREN) 1 % GEL Apply 1 application topically 2 (two) times daily as needed for pain.    Marland Kitchen docusate sodium (COLACE) 100 MG capsule Take 100 mg by mouth daily as needed for mild constipation.    . DULoxetine (CYMBALTA) 60 MG capsule Take 1 capsule (60 mg total) by mouth daily. 30 capsule PRN  . fesoterodine (TOVIAZ) 4 MG  TB24 tablet Take 1 tablet (4 mg total) by mouth daily. 30 tablet 0  . Fish Oil-Cholecalciferol (FISH OIL + D3 PO) Take 1 capsule by mouth daily.    Marland Kitchen gabapentin (NEURONTIN) 100 MG capsule take 2 capsules by mouth three times a day (Patient taking differently: Take 200 mg by mouth 2 (two) times daily. ) 180 capsule 5  . glucosamine-chondroitin 500-400 MG tablet Take 1 tablet by mouth 3 (three) times daily.    Marland Kitchen guaiFENesin-dextromethorphan (ROBITUSSIN DM) 100-10 MG/5ML syrup Take 5 mLs by mouth every 6 (six) hours as needed for cough. 118 mL 0  . levothyroxine (SYNTHROID, LEVOTHROID) 100 MCG tablet take 1 tablet by mouth once daily (Patient taking differently: Take 100 mcg by mouth in the morning) 90 tablet 1  . metoprolol succinate (TOPROL-XL) 25 MG 24 hr tablet Take 25 mg by mouth daily.  0  . ondansetron (ZOFRAN ODT) 4 MG disintegrating tablet Take 1 tablet (4 mg total) by mouth every 8 (eight) hours as needed for nausea or vomiting. 10 tablet 0  . oseltamivir (TAMIFLU) 30 MG capsule Take 1 capsule (30 mg total) by mouth 2 (two) times daily. 5 capsule 0  . polyethylene glycol (MIRALAX / GLYCOLAX) packet take 17 grams by mouth once daily (Patient taking differently: Mix 17 gm into juice and drink once a day as needed for mild constipation) 100 packet prn  . TRAVATAN Z 0.004 % SOLN ophthalmic solution Place 1 drop into both eyes at bedtime.     . traZODone (DESYREL) 50 MG tablet take 1 tablet by mouth at bedtime 90 tablet 3   Current Facility-Administered Medications  Medication Dose Route Frequency Provider Last Rate Last Dose  . ipratropium-albuterol (DUONEB) 0.5-2.5 (3) MG/3ML nebulizer solution 3 mL  3 mL Nebulization Once Carlyle Dolly, MD        Social Hx:  reports that she has never smoked. She has never used smokeless tobacco.   Objective:   BP 98/70 (BP Location: Left Arm, Patient Position: Sitting, Cuff Size: Normal)   Pulse 74   Temp 98.1 F (36.7 C) (Oral)   Ht 5\' 2"   (1.575 m)   Wt 130 lb 3.2 oz (59.1 kg)   SpO2 92%   BMI 23.81 kg/m  Physical Exam  Gen: NAD, alert, cooperative with exam, well-appearing HEENT: NCAT, PERRL, clear conjunctiva, oropharynx clear, supple neck Cardiac: Regular rate and rhythm, normal S1/S2, no murmur, no edema, capillary refill brisk  Respiratory: Clear to auscultation bilaterally, no wheezes, non-labored breathing Gastrointestinal: soft, non tender, non distended, bowel sounds present Skin: no rashes, normal turgor  Neurological: no gross deficits.  Psych: good insight, normal mood and affect   Assessment & Plan:  No problem-specific Assessment & Plan notes found for this encounter.   Smitty Cords, MD [place], PGY-2

## 2016-05-23 NOTE — Assessment & Plan Note (Addendum)
Patient having a difficult time recovering from influenza virus, she is slightly better than she was at discharge. At today's visit she is afebrile with normal work of breathing. No signs of respiratory distress. She was given a DuoNeb breathing treatment which patient states made her feel better. Discussed patient with Dr. Mingo Amber who also examined her. The plan is as follows: -Continue symptomatic treatment with plenty of rest, push fluids, soups -Tylenol when necessary -Refilled albuterol inhaler, advised to give every 6 hours and before bed -Follow-up in 2 days -At follow-up suggest checking BMP and UA as this was not done after hospitalization. provided patient with a urine cup that they will collect at home one hour prior to follow-up visit as it is difficult for patient to urinate in our public restroom.,

## 2016-05-23 NOTE — Patient Instructions (Signed)
Thank you for coming in today, it was so nice to see you! Today we talked about:    Coughing and wheezing: Please use the inhaler every 6 hours and before bed. We will check her urine and her kidneys at that visit as well.   Continue boost or ensure   Please go to the hospital if her breathing gets worse, she is not acting herself, or she gets a fever  Please follow up on Wednesday on 05/25/16. You can schedule this appointment at the front desk before you leave or call the clinic.  If you have any questions or concerns, please do not hesitate to call the office at [phone]. You can also message me directly via MyChart.   Sincerely,  Smitty Cords, MD
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
## 2016-05-26 ENCOUNTER — Ambulatory Visit (INDEPENDENT_AMBULATORY_CARE_PROVIDER_SITE_OTHER): Payer: Medicare Other | Admitting: Family Medicine

## 2016-05-26 ENCOUNTER — Encounter: Payer: Self-pay | Admitting: Family Medicine

## 2016-05-26 VITALS — BP 110/50 | HR 71 | Temp 98.0°F | Wt 130.4 lb

## 2016-05-26 DIAGNOSIS — N179 Acute kidney failure, unspecified: Secondary | ICD-10-CM | POA: Diagnosis not present

## 2016-05-26 DIAGNOSIS — R319 Hematuria, unspecified: Secondary | ICD-10-CM | POA: Diagnosis not present

## 2016-05-26 DIAGNOSIS — J101 Influenza due to other identified influenza virus with other respiratory manifestations: Secondary | ICD-10-CM | POA: Diagnosis not present

## 2016-05-26 DIAGNOSIS — N390 Urinary tract infection, site not specified: Secondary | ICD-10-CM

## 2016-05-26 LAB — CBC WITH DIFFERENTIAL/PLATELET
Basophils Absolute: 0 cells/uL (ref 0–200)
Basophils Relative: 0 %
EOS PCT: 1 %
Eosinophils Absolute: 97 cells/uL (ref 15–500)
HCT: 35.6 % (ref 35.0–45.0)
HEMOGLOBIN: 11.4 g/dL — AB (ref 11.7–15.5)
LYMPHS ABS: 1649 {cells}/uL (ref 850–3900)
Lymphocytes Relative: 17 %
MCH: 31.2 pg (ref 27.0–33.0)
MCHC: 32 g/dL (ref 32.0–36.0)
MCV: 97.5 fL (ref 80.0–100.0)
MONOS PCT: 6 %
MPV: 10.2 fL (ref 7.5–12.5)
Monocytes Absolute: 582 cells/uL (ref 200–950)
NEUTROS ABS: 7372 {cells}/uL (ref 1500–7800)
Neutrophils Relative %: 76 %
PLATELETS: 344 10*3/uL (ref 140–400)
RBC: 3.65 MIL/uL — AB (ref 3.80–5.10)
RDW: 14 % (ref 11.0–15.0)
WBC: 9.7 10*3/uL (ref 3.8–10.8)

## 2016-05-26 LAB — POCT URINALYSIS DIPSTICK
Bilirubin, UA: NEGATIVE
Glucose, UA: NEGATIVE
Ketones, UA: NEGATIVE
NITRITE UA: NEGATIVE
PH UA: 6.5
PROTEIN UA: 100
Spec Grav, UA: 1.01
UROBILINOGEN UA: 0.2

## 2016-05-26 LAB — BASIC METABOLIC PANEL WITH GFR
BUN: 19 mg/dL (ref 7–25)
CO2: 30 mmol/L (ref 20–31)
Calcium: 10.2 mg/dL (ref 8.6–10.4)
Chloride: 94 mmol/L — ABNORMAL LOW (ref 98–110)
Creat: 1.26 mg/dL — ABNORMAL HIGH (ref 0.60–0.88)
GFR, EST AFRICAN AMERICAN: 42 mL/min — AB (ref >=60)
GFR, EST NON AFRICAN AMERICAN: 37 mL/min — AB (ref >=60)
Glucose, Bld: 118 mg/dL — ABNORMAL HIGH (ref 65–99)
POTASSIUM: 4.8 mmol/L (ref 3.5–5.3)
SODIUM: 130 mmol/L — AB (ref 135–146)

## 2016-05-26 MED ORDER — CEPHALEXIN 500 MG PO CAPS: 500.0000 mg | ORAL_CAPSULE | Freq: Two times a day (BID) | ORAL | 0 refills | Status: AC

## 2016-05-26 NOTE — Assessment & Plan Note (Addendum)
Patient doing better than she was before. Her respiratory status is greatly improved with a mostly normal respiratory exam today. There are no signs of respiratory distress and patient is afebrile. She continues to have fatigue and poor appetite. -We'll continue symptomatic treatment with plenty of rest, push fluids, soups, boost/in shore drinks -Can continue albuterol inhaler twice a day and before bed for symptomatic care for the next week -Return precautions discussed, suggested follow-up with PCP in one week

## 2016-05-26 NOTE — Patient Instructions (Signed)
Thank you for coming in today, it was so nice to see you! Today we talked about:    Recovering from the flu  Reasons to go to the hospital: no urine all day, not able to drink water or liquid, trouble breathing, wheezing, change in her behavior  Please follow up in 1 week if you're still feeling sick.   If we ordered any tests today, you will be notified via telephone of any abnormalities. If everything is normal you will get a letter in the mail.   If you have any questions or concerns, please do not hesitate to call the office at [phone]. You can also message me directly via MyChart.   Sincerely,  Smitty Cords, MD

## 2016-05-26 NOTE — Progress Notes (Signed)
Subjective:    Patient ID: Jordan [name] , female   DOB: 1920-02-28 , 83 y.o..   MRN: [id number]  HPI  Jordan [name] is here for  Chief Complaint  Patient presents with  . Follow-up   Influenza A follow up:  Patient is here today for follow-up after having influenza A virus. She was in the hospital earlier this month and discharged on January 13th . She was seen by myself on January 22 because she just was not feeling much better after her hospitalization. At that time there were no signs of respiratory distress and she was given a DuoNeb treatment which improved her symptoms. Since patient was seen during that time she has been using her albuterol inhaler every 6 hours and before bed. Patient has been breathing better than before. Her breathing is much more comfortable. Patient's daughter is here with her today and notes that her fatigue seems about the same. Her appetite continues to be poor. She is tolerating water and sure small bites of solids.  Concerns for UTI:  Patient's daughter is worried that she may have a UTI as the last time she had one she behaved the same way. Patient denies any dysuria, flank pain, pelvic pain, or fever. Patient's daughter is worried that she is unable to say that she is having any symptoms because of her dementia.   Patient denies any fever, chills, wheezing, nausea, vomiting, diarrhea, constipation, dysuria, abdominal pain.   Review of Systems: Per HPI. All other systems reviewed and are negative.  Medications: reviewed   Social Hx:  reports that she has never smoked. She has never used smokeless tobacco.   Objective:   BP (!) 110/50   Pulse 71   Temp 98 F (36.7 C) (Oral)   Wt 130 lb 6.4 oz (59.1 kg)   SpO2 97%   BMI 23.85 kg/m  Physical Exam  Gen: NAD, alert, cooperative with exam, elderly female in wheelchair HEENT: NCAT, PERRL, clear conjunctiva, oropharynx clear, supple neck, moist mucous membranes Cardiac: Regular rate and rhythm, no  edema, capillary refill brisk  Respiratory: Clear to auscultation bilaterally, faint bilateral expiratory wheezes at lung bases, non-labored breathing Gastrointestinal: soft, non tender, non distended, bowel sounds present Skin: no rashes, normal turgor   POCT urinalysis dipstick  Result Value Ref Range   Color, UA YELLOW    Clarity, UA CLOUDY    Glucose, UA NEG    Bilirubin, UA NEG    Ketones, UA NEG    Spec Grav, UA 1.010    Blood, UA MODERATE    pH, UA 6.5    Protein, UA 100    Urobilinogen, UA 0.2    Nitrite, UA NEG    Leukocytes, UA large (3+) (A) Negative     Assessment & Plan:  Influenza A Patient doing better than she was before. Her respiratory status is greatly improved with a mostly normal respiratory exam today. There are no signs of respiratory distress and patient is afebrile. She continues to have fatigue and poor appetite. -We'll continue symptomatic treatment with plenty of rest, push fluids, soups, boost/in shore drinks -Can continue albuterol inhaler twice a day and before bed for symptomatic care for the next week -Return precautions discussed, suggested follow-up with PCP in one week  UTI (urinary tract infection) Checked urinalysis based on daughter's concern that patient has a UTI  and is unable to endorse symptoms due to dementia. Urinalysis showed large leukocytes, negative nitrites, moderate blood. -Prescription for  Keflex into pharmacy; Keflex 500 mg twice a day 7 days -Urine culture pending -CBC and BMP drawn   Jordan Cords, MD Jordan [name], PGY-2

## 2016-05-27 DIAGNOSIS — N390 Urinary tract infection, site not specified: Secondary | ICD-10-CM

## 2016-05-27 HISTORY — DX: Urinary tract infection, site not specified: N39.0

## 2016-05-27 NOTE — Assessment & Plan Note (Addendum)
Checked urinalysis based on daughter's concern that patient has a UTI  and is unable to endorse symptoms due to dementia. Urinalysis showed large leukocytes, negative nitrites, moderate blood. -Prescription for Keflex into pharmacy; Keflex 500 mg twice a day 7 days -Urine culture pending -CBC and BMP drawn

## 2016-06-01 ENCOUNTER — Other Ambulatory Visit: Payer: Self-pay | Admitting: Family Medicine

## 2016-06-01 NOTE — Telephone Encounter (Signed)
Needs refill on nexium.  [facility] on [address]

## 2016-06-02 MED ORDER — ESOMEPRAZOLE MAGNESIUM 40 MG PO CPDR: 40.0000 mg | DELAYED_RELEASE_CAPSULE | Freq: Every day | ORAL | 99 refills | Status: DC

## 2016-06-13 ENCOUNTER — Telehealth: Payer: Self-pay | Admitting: Family Medicine

## 2016-06-13 ENCOUNTER — Other Ambulatory Visit: Payer: Self-pay | Admitting: Family Medicine

## 2016-06-13 NOTE — Telephone Encounter (Signed)
Daughter wanted to let PCP know that the home health nurse will be sending orders over for pads for the bed and a Rx for Ensure. ep

## 2016-06-16 DIAGNOSIS — Z950 Presence of cardiac pacemaker: Secondary | ICD-10-CM | POA: Diagnosis not present

## 2016-06-21 ENCOUNTER — Telehealth: Payer: Self-pay | Admitting: Family Medicine

## 2016-06-21 NOTE — Telephone Encounter (Signed)
Pt needs a refill on lidocaine cream. Pt uses [facility] on [address], but as of tomorrow it will be called [facility]. ep

## 2016-06-21 NOTE — Telephone Encounter (Signed)
2nd request. Daughter states pt needs this as soon as possible. Please call daughter after they have been called in. [phone]. ep

## 2016-06-21 NOTE — Telephone Encounter (Signed)
Will forward to MD. Charistopher Rumble,CMA  

## 2016-06-22 ENCOUNTER — Other Ambulatory Visit: Payer: Self-pay | Admitting: Family Medicine

## 2016-06-22 NOTE — Telephone Encounter (Signed)
Please let patient's daughter know that Dr Keymon Mcelroy has not received this paperwork.

## 2016-06-23 ENCOUNTER — Other Ambulatory Visit: Payer: Self-pay | Admitting: Family Medicine

## 2016-06-23 NOTE — Telephone Encounter (Signed)
I looked through providers box and did not see any order. I Spoke with pts daughter and informed her of this. Pts daughter is to call and have the orders refaxed. Fax number was given.

## 2016-07-27 ENCOUNTER — Other Ambulatory Visit: Payer: Self-pay | Admitting: Family Medicine

## 2016-08-04 ENCOUNTER — Encounter: Payer: Self-pay | Admitting: Family Medicine

## 2016-08-05 ENCOUNTER — Other Ambulatory Visit: Payer: Self-pay | Admitting: Family Medicine

## 2016-08-11 ENCOUNTER — Ambulatory Visit (INDEPENDENT_AMBULATORY_CARE_PROVIDER_SITE_OTHER): Payer: Medicare Other | Admitting: Family Medicine

## 2016-08-11 ENCOUNTER — Encounter: Payer: Self-pay | Admitting: Family Medicine

## 2016-08-11 VITALS — BP 110/58 | HR 74 | Temp 98.1°F | Wt 130.0 lb

## 2016-08-11 DIAGNOSIS — R682 Dry mouth, unspecified: Secondary | ICD-10-CM | POA: Diagnosis not present

## 2016-08-11 DIAGNOSIS — E038 Other specified hypothyroidism: Secondary | ICD-10-CM

## 2016-08-11 DIAGNOSIS — M1A0210 Idiopathic chronic gout, right elbow, without tophus (tophi): Secondary | ICD-10-CM

## 2016-08-11 DIAGNOSIS — Z79899 Other long term (current) drug therapy: Secondary | ICD-10-CM | POA: Diagnosis not present

## 2016-08-11 DIAGNOSIS — Z950 Presence of cardiac pacemaker: Secondary | ICD-10-CM

## 2016-08-11 DIAGNOSIS — I498 Other specified cardiac arrhythmias: Secondary | ICD-10-CM

## 2016-08-11 DIAGNOSIS — N3941 Urge incontinence: Secondary | ICD-10-CM | POA: Diagnosis not present

## 2016-08-11 DIAGNOSIS — M8088XS Other osteoporosis with current pathological fracture, vertebra(e), sequela: Secondary | ICD-10-CM

## 2016-08-11 DIAGNOSIS — K117 Disturbances of salivary secretion: Secondary | ICD-10-CM

## 2016-08-11 DIAGNOSIS — M48061 Spinal stenosis, lumbar region without neurogenic claudication: Secondary | ICD-10-CM

## 2016-08-11 DIAGNOSIS — R634 Abnormal weight loss: Secondary | ICD-10-CM | POA: Diagnosis not present

## 2016-08-11 DIAGNOSIS — N3946 Mixed incontinence: Secondary | ICD-10-CM

## 2016-08-11 DIAGNOSIS — E7800 Pure hypercholesterolemia, unspecified: Secondary | ICD-10-CM

## 2016-08-11 LAB — POCT URINALYSIS DIP (MANUAL ENTRY)
Bilirubin, UA: NEGATIVE
Glucose, UA: NEGATIVE mg/dL
Ketones, POC UA: NEGATIVE mg/dL
Nitrite, UA: NEGATIVE
SPEC GRAV UA: 1.015 (ref 1.010–1.025)
UROBILINOGEN UA: 0.2 U/dL
pH, UA: 7.5 (ref 5.0–8.0)

## 2016-08-11 LAB — POCT UA - MICROSCOPIC ONLY

## 2016-08-11 NOTE — Patient Instructions (Signed)
Dr Shaylah Mcghie will call you if your tests are not good. Otherwise he will send you a letter.  If you sign up for MyChart online, you will be able to see your test results once Dr Issa Kosmicki has reviewed them.  If you do not hear from [place] with in 2 weeks please call our office  Please come by our office to pick up TENS units to try for pain relief for Jordan [name].   Will call for antibiotic treatment of urinary tract infection if urine culture grows bacteria.  [facility] should be contacting you for home physical therapy in 2 to 3 business days.

## 2016-08-12 ENCOUNTER — Telehealth: Payer: Self-pay | Admitting: Family Medicine

## 2016-08-12 ENCOUNTER — Encounter: Payer: Self-pay | Admitting: Family Medicine

## 2016-08-12 DIAGNOSIS — R634 Abnormal weight loss: Secondary | ICD-10-CM | POA: Insufficient documentation

## 2016-08-12 LAB — CBC
Hematocrit: 34.2 % (ref 34.0–46.6)
Hemoglobin: 11.2 g/dL (ref 11.1–15.9)
MCH: 32.1 pg (ref 26.6–33.0)
MCHC: 32.7 g/dL (ref 31.5–35.7)
MCV: 98 fL — ABNORMAL HIGH (ref 79–97)
Platelets: 154 10*3/uL (ref 150–379)
RBC: 3.49 x10E6/uL — ABNORMAL LOW (ref 3.77–5.28)
RDW: 14.6 % (ref 12.3–15.4)
WBC: 6.4 10*3/uL (ref 3.4–10.8)

## 2016-08-12 LAB — LIPID PANEL
CHOLESTEROL TOTAL: 217 mg/dL — AB (ref 100–199)
Chol/HDL Ratio: 3.6 ratio (ref 0.0–4.4)
HDL: 60 mg/dL (ref >39)
LDL Calculated: 115 mg/dL — ABNORMAL HIGH (ref 0–99)
Triglycerides: 209 mg/dL — ABNORMAL HIGH (ref 0–149)
VLDL Cholesterol Cal: 42 mg/dL — ABNORMAL HIGH (ref 5–40)

## 2016-08-12 LAB — TSH: TSH: 1.19 u[IU]/mL (ref 0.450–4.500)

## 2016-08-12 MED ORDER — COLCHICINE 0.6 MG PO TABS: 0.6000 mg | ORAL_TABLET | Freq: Every day | ORAL | 3 refills | Status: DC

## 2016-08-12 NOTE — Assessment & Plan Note (Signed)
Old problem that may contribute to pt weight loss Discuss food stimulant and stopping meds with antimuscarinic activity Would not use the muscarinc agents

## 2016-08-12 NOTE — Assessment & Plan Note (Signed)
Old problem with recent worsening in urgency incontinence Pt wears adult diapers Using timed voids, On Toviaz 4 mg daily Not candidate to attend pelvic rehab for stress incotninence bc of frailty and painCan try trial of Toviaz 8 mg daily though this may worsen her xerostomia.  No other focal specific sxs of UTI but dgt seems to attribute it to UTI bc she has had many "UTIs" treated with Abx by physicians in past.  Discussion of asymptomatic bactiuria and promoting development of MDRA did not seem to sway her. UA and Urice Cx sent/

## 2016-08-12 NOTE — Telephone Encounter (Signed)
I informed pt's dgt, Ms Iran Sizer, that her mother cannot have TENS unit bc of her PM.

## 2016-08-12 NOTE — Assessment & Plan Note (Signed)
Could try trial of Calcitonin as this back pain worsening may be further or new vertbral compression practure.

## 2016-08-12 NOTE — Progress Notes (Signed)
Subjective:    Patient ID: Jordan [name], female    DOB: 05-24-1919, 83 y.o.   MRN: [id number] Jordan [name] is accompanied by daughter, Jordan [name]. Sources of clinical information for visit is/are patient, relative(s) and past medical records. Nursing assessment for this office visit was reviewed with the patient for accuracy and revision.   HPI Gout - [name] report most recent attack of acute gouty arthritis in right elbow in February this year when pt was out of Colchicine.   Treated with NSAIDs and Ice tx - Pt taking colchicine 0.6 mg BID for prophylaxis since 2015 - Pt received notice from part d provider [employer] that colchcine is not on our list of covered drugs.   Chronic low back pain - bilateral about midline of lower back - Accompanying lumbar spinal stenosis - Problem of Chronic Pain Syndrome - On Duloxetine, topical diclofenac and and lidocaine (OTC), APAP, Tramdol prn, oxycodone 2.5 mg PRN, soft back brack, glucosamine chondroitin, gabapentin - - Duration chronic - Severity: Constant pain, minimal improvement with APAP, Tramadol, NSAIDs; oxycodone 2.5 mg makes patient drowsy - dgt gives only once or twice a month when pain is intolerable and unresponsive adequately to other  Analgesic modalities. ,  - Back brace casues sweating that is intolerable to patient.  - Associated Symptoms: No radiation into legs. No numbness in medial thighs or perineum.    Hypothyroidism Longstanding issue for patient Patient presents for evaluation of thyroid function.  Symptoms consist of fatigue, constipation, weight loss.  Symptoms have present for 1 year.  The symptoms are mild.   The problem has been gradually worsening.   Previous thyroid studies include TSH.     Polypharmacy - medications administered by dgt. - Weight loss present over last 4 months of 9 lbs (6 %) Decrease appetite - food still taste good, she just does not want to eat as much Stable: No   Constitutional  symptoms: No  Medications cause: Polypharmacy Unintentional Weight Loss in Older Adults History of weight loss in past: 02/13/17 for her right shoulder pain which was severe and disabling and around same time of her [place] 2 HB requiring PM wt was 130 pounds. Diet Type: general; Mechanical Soft / Ground; Thin  Average meal intake, recent: decreased but adequate  Fraility: Minimal activity beyond assisted ADLs, persistent fatigue, nonambulatory (WC bound), tires with standing   Medication effects: Cymbalta  Polypharmacy (> 4 medications): Yes   Malignancy Hx*: No   Ongoing inflammatory or increased catabolic condition: No, but dgt worried that patient's increase in urinary urgency due to UTI  Recent acute Illness: Yes, hospitalization in January for Influenza with AKI. Vertigo in November last year.   Emotional problems, especially depression*: Yes   Alcoholism / Substance Abuse: No   Late-life paranoia: No   Swallowing disorders: Yes Ill fitting dentures and presbyesophagus with Food impaction in middle and distal third of esophagus by EGD ([facility]) in Mid May last year. Meds associated with dysphagia or dry mouth: No  Dysgeusia:No symptom, Allopurinol associated with dysgeusia, metoprolol too  Oral factors (e.g., poorly fitting dentures, caries): Yes   Food insecurity: No   Hyperthyroidism: Yes, but on replacement tx  GI Disorders Hx: see above in swallowing disorder Nausea and/or Vomiting: No.  SNRI associated with nausea    GI/Biliary surgeries: No   Eating problems (e.g., inability to feed self): No   Dental or denture problems: Yes   Low-salt, low-cholesterol diet: No   Stones, social problems (  e.g., isolation, inability to obtain preferred foods): No   Cognitive impairment*: Yes   Immunocompromised: No   Diabetes:No ; Control  Organ Failure (Cardiac, Respiratory, Renal, Liver): No    Autoimmune Disorders (RA, SLE, etc): No    Neurologic Conditions  (Stroke, Parkinsons, Chronic Pain, Dementia): Yes, Chronic Pain and Dementia  Symptoms:  General Fever: Yes  Fatigue: Yes    HEENT Headache (Temporal Arteritis): No  Loss of sense of smell: No  Head cold symptoms: No  Oral sores / bad teeth: No  If dentures, well-fitting: Yes  Cardiovascular Abdominal pain with eating: No  Heart Failure Hx: No  Respiratory Pulmonary Disease Hx: scattered bronchiectasis on Chest CT during Influenza + infection 1/18 Cough: No  Gastrointestinal Indigestion/heartburn: No  Epigastric Pain: No  Hematemesis: No  Nausea and/or Vomiting: No  Diarrhea: No  Constipation: occasional Genitourinary Dysuria: No  Frequency: Yes, chronic incontinence (mixed)  New urinary incontinence: Yes  Hematuria: No  Hematologic Anemia History: No  Swollen lymph nodes: No  Musculoskeletal Shoulder stiffness:  Right shoulder sp replacement Muscle strength decrease: Yes generalized  Neuropsychologic Prolonged sadness: Yes  Loss of pleasure: uncertain Paranoia: No  Anxiousness / fearfullness: No   Cancer Screening History Breast Cancer: No  Cervical Cancer: No  Colorectal Cancer: No Hyperplastic polyps x 2 2006 colonosciop Prostate Cancer: No  Lung Cancer: No       Urinary Urgency - long standing probvlem but dgt feels it has worsen recently  - hx of Pelvic prolapse problems - Long history of urinary incontinence (mixed type) - Taking Toviaz 4 mg daily since 2015 - Long history of being treated for "bladder infections" - No dysuria  Social History: Lives with son and dgt.  No smoking by patient or in household  Review of Systems See hpi    Objective:   Physical Exam VS reviewed GEN: Alert, Cooperative, Groomed, NAD HEENT: PERRL; EAC bilaterally not occluded, TM's translucent with normal LM, (+) LR;                No cervical LAN, No thyromegaly, No palpable masses COR: , No M/G/R, No JVD, Normal PMI size and location LUNGS: BCTA, No Acc mm  use, speaking in full sentences Back: kyphotic, tender midline mid to lower lumbar spinous processes.  ABDOMEN: (+)BS, soft,  Psych: flat affect but smiles with social greeting/ answering question of dgt appropriately per the interpretation rendering to interviewer.        Assessment & Plan:

## 2016-08-12 NOTE — Assessment & Plan Note (Signed)
Established problem worsened.  Current regiment schedule apap 650, tramadol prn, topical nsaid and topical lidocine. Prn oxycodone 2.5 mg prn\ Not injection candidate Not TENS candidtate bc pm Not OMT bc of vertebral osteoporosis [name] not interested in atypical treatments such as accupuncture.   Can and trial of low dose nsaid and watch renal fnc Can try trial oral prednisone burst Can try muscle relaxant baclofen or tizanidine (may help sleep) Will send PT to home to help with positioning for pain relief in [facility]

## 2016-08-12 NOTE — Assessment & Plan Note (Addendum)
New proble, evolving 6% weight loss since 04/20/17 ED visit for Vertigo (4 months) Possible Contributing Factors;  Chronic Pain in back that has been difficult to control with tolerable anagesics.  Pt not canditate for Spinal injection bc can't stop Eliquis per cardiology.  Frailty: patient meets the weight loss, low activity, Fatigue, WC bound, requiring assistance with ADLs  Physiologic decline in appetite with age  Polypharmacy with common culprit meds of Duloxetine and beta blockers  Intercurrent Influenza infection  Chronic Pain, low back  Depressed mood, persistent  Presbyesophagus with hx esophageal food impaction May last year  Poor fitting dentures  Thyroid condition  Dementia, chronic progressive neurodegenerative  Xerostomia  We can check CMET, CBC, TSH to look for organ failure or endocrine diosrder signature, or signal of from CBC. I am doing this for throughness, not for great suspicion.   If the chronic back pain could be lessened without significant risk of adverse response, this may improve her appetite some.  Her dentures could be fitted properly.   This weight loss may be an expression of her Frailty Phenotype with little in the way of reversibility.  Remeron has been tried in past for weight gain (and sleep and mood) without much benefit   Will discuss decreasing duloxetine next visit. Salivary stimulating foods.

## 2016-08-12 NOTE — Assessment & Plan Note (Addendum)
Old probvlem with recent acute gouty arthritis of right elbow.  Pt taking allopurinol daily and colchicine BID. Her Medicare Advantage plan does not cover thecolchicine.   I contacted [employer] ([phone]) who approved Colchicine 0.6 mg daily.  May refill for month. Case number for request [id number].

## 2016-08-12 NOTE — Assessment & Plan Note (Signed)
Established problem TSH moonitor today Confusion over dose of LT4 (either 100 or 125 mcg) daily. dgt to call with dose from home.

## 2016-08-13 LAB — URINE CULTURE

## 2016-08-14 LAB — URINE CULTURE

## 2016-08-15 ENCOUNTER — Telehealth: Payer: Self-pay | Admitting: Family Medicine

## 2016-08-15 MED ORDER — NITROFURANTOIN MONOHYD MACRO 100 MG PO CAPS: 100.0000 mg | ORAL_CAPSULE | Freq: Two times a day (BID) | ORAL | 0 refills | Status: DC

## 2016-08-15 NOTE — Telephone Encounter (Signed)
Daughter states the pt's labs from last week are not available on my chart and needs them faxed to her today because pt has to bring the results with her to the Cardiologist tomorrow.  Fax [phone] ep

## 2016-08-15 NOTE — Telephone Encounter (Signed)
I discussed with Mrs Allende's Charolett Bumpers, Cathleen Fears, that presence of Enterococcus in her mother's bladder. We discussed the possiblity that the Enterococcus could represent either a true acute cystitis that is casuing her mother's increase in urinary frequency and urgency or represents asymptomatic bacteriuria with uncertain cause of her mother's subjective increase in urgency and frequency.  We agreed to a trial of a Abx to which the Enterococcus test sensitive, Nitrofurantoin, for 7 days.  Ms Jillene Bucks with call the office to let us know if the urgency and frequency symptoms improve with the Abx therapy.  If these urinary symptom increase do not imporve with effective Abx therapy, then I would greatly suspect that Ms Bowron's urinary symptoms are not due to cystitix, the enterococcus likely representing bladder colonization common to older adults, especially women.   eGFR of patient > 30 ml/min.  Rx 100 mg every 12 hours for 7 days.

## 2016-08-15 NOTE — Telephone Encounter (Signed)
Please release labs to my chart for patients daughter!

## 2016-08-16 DIAGNOSIS — I10 Essential (primary) hypertension: Secondary | ICD-10-CM | POA: Diagnosis not present

## 2016-08-16 DIAGNOSIS — I480 Paroxysmal atrial fibrillation: Secondary | ICD-10-CM | POA: Diagnosis not present

## 2016-08-16 DIAGNOSIS — Z8673 Personal history of transient ischemic attack (TIA), and cerebral infarction without residual deficits: Secondary | ICD-10-CM | POA: Diagnosis not present

## 2016-08-16 DIAGNOSIS — Z950 Presence of cardiac pacemaker: Secondary | ICD-10-CM | POA: Diagnosis not present

## 2016-08-16 NOTE — Telephone Encounter (Signed)
Contacted pts daughter and informed her that I have faxed over her mothers labs to her directly. Pts daughter was appreciative and will look for PCPs call to discuss results.

## 2016-08-16 NOTE — Telephone Encounter (Signed)
Patient daughter calling again, states she needs labs faxed to number provided below or fax to her personally at [phone]. States she needs this now as patient has an appointment later today.

## 2016-08-22 ENCOUNTER — Telehealth: Payer: Self-pay | Admitting: *Deleted

## 2016-08-22 NOTE — Telephone Encounter (Signed)
Just wanted to make provider aware that there was a delay in the start of [facility] most recent order with patient.  Family asked that they start on 08/25/16 at 4pm.  Will forward to MD to make aware. Jarrett Chicoine,CMA

## 2016-08-24 ENCOUNTER — Other Ambulatory Visit: Payer: Self-pay | Admitting: Family Medicine

## 2016-08-25 ENCOUNTER — Telehealth: Payer: Self-pay | Admitting: *Deleted

## 2016-08-25 DIAGNOSIS — M109 Gout, unspecified: Secondary | ICD-10-CM | POA: Diagnosis not present

## 2016-08-25 DIAGNOSIS — M48061 Spinal stenosis, lumbar region without neurogenic claudication: Secondary | ICD-10-CM | POA: Diagnosis not present

## 2016-08-25 DIAGNOSIS — M549 Dorsalgia, unspecified: Secondary | ICD-10-CM | POA: Diagnosis not present

## 2016-08-25 DIAGNOSIS — Z950 Presence of cardiac pacemaker: Secondary | ICD-10-CM | POA: Diagnosis not present

## 2016-08-25 NOTE — Telephone Encounter (Signed)
Herbert Deaner, Physical Therapist with [facility] called to request patient's medication list.  List printed and placed in to be fax area.  Derl Barrow, RN

## 2016-08-26 ENCOUNTER — Telehealth: Payer: Self-pay | Admitting: *Deleted

## 2016-08-26 NOTE — Telephone Encounter (Signed)
Clair Gulling, Physical Therapist with [facility] called to request for verbal orders.  Physical therapy starting next week twice a wk for 4 weeks for pain reduction, functional mobility and fall risk reduction.  Also requesting order for occupational therapy for ADLs (bathing and toileting).  Please give him a call at [phone].  Derl Barrow, RN

## 2016-08-26 NOTE — Telephone Encounter (Signed)
Please call verbal orders to [facility] for:  1) PT 2 times a week for 4 weeks pain reduction, Functional mobility and fall risk reduction 2) OT for evaluation and treatment of ADLs

## 2016-08-26 NOTE — Telephone Encounter (Signed)
Returned call to [employer], verbal orders given.

## 2016-08-30 DIAGNOSIS — Z950 Presence of cardiac pacemaker: Secondary | ICD-10-CM | POA: Diagnosis not present

## 2016-08-30 DIAGNOSIS — M109 Gout, unspecified: Secondary | ICD-10-CM | POA: Diagnosis not present

## 2016-08-30 DIAGNOSIS — M48061 Spinal stenosis, lumbar region without neurogenic claudication: Secondary | ICD-10-CM | POA: Diagnosis not present

## 2016-08-30 DIAGNOSIS — M549 Dorsalgia, unspecified: Secondary | ICD-10-CM | POA: Diagnosis not present

## 2016-08-31 ENCOUNTER — Telehealth: Payer: Self-pay | Admitting: *Deleted

## 2016-08-31 NOTE — Telephone Encounter (Signed)
Patient's daughter called stating patient is having right shoulder blade pain.  Patient was seen by therapist yesterday and told it could be a muscle spasm.  Therapist stated patient's lungs were clear.  Patient is complaining patient is worse today than yesterday.  This is a new issue for patient per daughter.  Appointment scheduled for tomorrow with same day provider at 1:30 PM.  Derl Barrow, RN

## 2016-09-01 ENCOUNTER — Encounter: Payer: Self-pay | Admitting: Internal Medicine

## 2016-09-01 ENCOUNTER — Other Ambulatory Visit: Payer: Self-pay | Admitting: Internal Medicine

## 2016-09-01 ENCOUNTER — Ambulatory Visit
Admission: RE | Admit: 2016-09-01 | Discharge: 2016-09-01 | Disposition: A | Discharge status: Home / Self Care | Payer: Medicare Other | Source: Ambulatory Visit | Attending: Family Medicine | Admitting: Family Medicine

## 2016-09-01 ENCOUNTER — Ambulatory Visit (INDEPENDENT_AMBULATORY_CARE_PROVIDER_SITE_OTHER): Payer: Medicare Other | Admitting: Internal Medicine

## 2016-09-01 VITALS — BP 130/62 | HR 65 | Temp 97.6°F | Ht 62.0 in | Wt 131.4 lb

## 2016-09-01 DIAGNOSIS — M5489 Other dorsalgia: Secondary | ICD-10-CM

## 2016-09-01 DIAGNOSIS — M549 Dorsalgia, unspecified: Secondary | ICD-10-CM | POA: Diagnosis not present

## 2016-09-01 DIAGNOSIS — M109 Gout, unspecified: Secondary | ICD-10-CM | POA: Diagnosis not present

## 2016-09-01 DIAGNOSIS — Z950 Presence of cardiac pacemaker: Secondary | ICD-10-CM | POA: Diagnosis not present

## 2016-09-01 DIAGNOSIS — M48061 Spinal stenosis, lumbar region without neurogenic claudication: Secondary | ICD-10-CM | POA: Diagnosis not present

## 2016-09-01 DIAGNOSIS — M47814 Spondylosis without myelopathy or radiculopathy, thoracic region: Secondary | ICD-10-CM | POA: Diagnosis not present

## 2016-09-01 MED ORDER — DICLOFENAC SODIUM 1 % TD GEL: 2.0000 g | Freq: Four times a day (QID) | TRANSDERMAL | 1 refills | Status: DC

## 2016-09-01 NOTE — Patient Instructions (Signed)
I think her pain is related to muscle spasm. We will go ahead and get an X-ray to ensure there is no bony involvement. I have prescribed Voltaren gel and ordered PT. Please follow up with her PCP.

## 2016-09-01 NOTE — Progress Notes (Signed)
Subjective:    Jordan [name] - 83 y.o. female MRN [id number]  Date of birth: 1919/10/25  HPI  Jordan [name] is here for SDA for right shoulder pain.  Right Shoulder Pain: Patient with history of right shoulder arthroplasty within the past 1.5 years. Presents with acute shoulder pain that began 6 days ago and has not been improving. Pain is located along her right shoulder blade. Hurts at rest and with arm movement. Denies trauma or falls. No numbness or weakness. No change in ROM of shoulder although movement is painful. Has been trying Tylenol and heat without relief. No chest pain or fevers at home.   -  reports that she has never smoked. She has never used smokeless tobacco. - Review of Systems: Per HPI. - Past Medical History: Patient Active Problem List   Diagnosis Date Noted  . Weight loss, unintentional 08/12/2016  . UTI (urinary tract infection) 05/27/2016  . Idiopathic gout 05/05/2016  . Anticoagulated by anticoagulation treatment 10/13/2015    Class: Chronic  . Collapsed vertebra due to osteoporosis ([place]) 10/06/2015  . Atherosclerosis of aorta ([place]) 10/05/2015  . Calcification of native coronary artery 10/05/2015  . Chronic bilateral thoracic back pain 10/05/2015  . History of Food impaction of esophagus   . Esophageal dysmotility   . Hyperlipidemia 02/27/2015  . GERD (gastroesophageal reflux disease) 07/25/2014  . Impaired mobility and ADLs 05/30/2014  . Chronic pain syndrome 05/09/2014  . Hearing loss of aging 05/09/2014  . Glaucoma 04/18/2014  . Macular degeneration, age related, nonexudative 04/18/2014  . Combined visual hearing impairment 11/12/2013  . History of acute gouty arthritis 09/21/2013  . 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 CKD (chronic kidney disease) stage 3, GFR 30-59 ml/min 09/03/2013  . Chronic diastolic heart failure ([place]) 09/01/2013  . Frailty 08/29/2013  . Constipation 08/23/2013  .  Alzheimer's dementia 07/30/2013  . Spinal stenosis of lumbar region 10/03/2012  . Gait instability 08/28/2012  . Xerostomia 01/24/2012  . Insomnia 06/14/2010  . PERIPHERAL NEUROPATHY 03/29/2010  . Hypothyroidism 06/29/2006  . Depressive disorder 06/29/2006  . HTN (hypertension) 06/29/2006  . CYSTOCELE/RECTOCELE/PROLAPSE,UNSPEC. 06/29/2006  . Urinary incontinence, mixed 06/29/2006  . OSTEOARTHRITIS, MULTI SITES 06/29/2006   - Medications: reviewed and updated   Objective:   Physical Exam Ht 5\' 2"  (1.575 m)   Wt 131 lb 6.4 oz (59.6 kg) Comment: per pt stated they weighed this morning  BMI 24.03 kg/m  Gen: NAD, alert, cooperative with exam, well-appearing Lungs: CTAB. Normal WOB.  MSK: No erythema or edema present over right shoulder or thoracic back. No TTP over right shoulder joint. No TTP over thoracic spine. TTP over the right paraspinal muscles medial to the right scapula. Paraspinal muscles on right feel tight compared to left. ROM intact for right shoulder although abduction past 90 degrees produces pain.  Neuro: Strength 5/5 at right shoulder. Negative Neer's test. Sensation to UE grossly intact.     Assessment & Plan:   1. Pain in right paraspinal region Suspect pain is muscular in nature. No pain over the actual shoulder joint and no changes in strength or ROM to suggest rotator cuff involvement. Patient does have known h/o right AC joint arthritis. Given history of compression fracture and patient's concern for potential fracture, will obtain thoracic X-ray although lower suspicion for bony involvement given lack of pain over spine. Have prescribed Voltaren gel and can continue with heat/ice/tylenol. Could consider trigger injection in the future. Follow  up with PCP.  - DG Thoracic Spine 2 View; Future - diclofenac sodium (VOLTAREN) 1 % GEL; Apply 2 g topically 4 (four) times daily.  Dispense: 100 g; Refill: 1 - Ambulatory referral to Physical Therapy   Phill Myron,  D.O. 09/01/2016, 1:55 PM PGY-2, [place]

## 2016-09-06 DIAGNOSIS — M549 Dorsalgia, unspecified: Secondary | ICD-10-CM | POA: Diagnosis not present

## 2016-09-06 DIAGNOSIS — M48061 Spinal stenosis, lumbar region without neurogenic claudication: Secondary | ICD-10-CM | POA: Diagnosis not present

## 2016-09-06 DIAGNOSIS — Z950 Presence of cardiac pacemaker: Secondary | ICD-10-CM | POA: Diagnosis not present

## 2016-09-06 DIAGNOSIS — M109 Gout, unspecified: Secondary | ICD-10-CM | POA: Diagnosis not present

## 2016-09-08 DIAGNOSIS — Z950 Presence of cardiac pacemaker: Secondary | ICD-10-CM | POA: Diagnosis not present

## 2016-09-08 DIAGNOSIS — M549 Dorsalgia, unspecified: Secondary | ICD-10-CM | POA: Diagnosis not present

## 2016-09-08 DIAGNOSIS — M48061 Spinal stenosis, lumbar region without neurogenic claudication: Secondary | ICD-10-CM | POA: Diagnosis not present

## 2016-09-08 DIAGNOSIS — M109 Gout, unspecified: Secondary | ICD-10-CM | POA: Diagnosis not present

## 2016-09-13 DIAGNOSIS — M109 Gout, unspecified: Secondary | ICD-10-CM | POA: Diagnosis not present

## 2016-09-13 DIAGNOSIS — M549 Dorsalgia, unspecified: Secondary | ICD-10-CM | POA: Diagnosis not present

## 2016-09-13 DIAGNOSIS — Z950 Presence of cardiac pacemaker: Secondary | ICD-10-CM | POA: Diagnosis not present

## 2016-09-13 DIAGNOSIS — M48061 Spinal stenosis, lumbar region without neurogenic claudication: Secondary | ICD-10-CM | POA: Diagnosis not present

## 2016-09-14 ENCOUNTER — Telehealth: Payer: Self-pay | Admitting: Family Medicine

## 2016-09-14 NOTE — Telephone Encounter (Signed)
[facility] called because they wanted to let the doctor know that the OT for 2 weeks has not taken place and will start next week. The family has been on vacation. [facility] just wanted to update the doctor. jw

## 2016-09-15 ENCOUNTER — Telehealth: Payer: Self-pay | Admitting: *Deleted

## 2016-09-15 NOTE — Telephone Encounter (Signed)
LVM for pt or pt daughter to call back to inform her of below. If they call back please give them the below message. Katharina Caper, Daniya Aramburo D, [place]

## 2016-09-15 NOTE — Telephone Encounter (Signed)
-----   Message from Nicolette Bang, DO sent at 09/02/2016  6:14 PM EDT ----- Please call patient's daughter to let her know that the X-ray showed stable compression fractures in the low back that were seen on prior imaging results. There are no findings that would explain her pain; therefore, I still believe this pain is related to muscular pain as I told them at her visit.   Phill Myron, D.O. 09/02/2016, 6:14 PM PGY-2, [place]

## 2016-09-16 DIAGNOSIS — M109 Gout, unspecified: Secondary | ICD-10-CM | POA: Diagnosis not present

## 2016-09-16 DIAGNOSIS — M48061 Spinal stenosis, lumbar region without neurogenic claudication: Secondary | ICD-10-CM | POA: Diagnosis not present

## 2016-09-16 DIAGNOSIS — M549 Dorsalgia, unspecified: Secondary | ICD-10-CM | POA: Diagnosis not present

## 2016-09-16 DIAGNOSIS — Z950 Presence of cardiac pacemaker: Secondary | ICD-10-CM | POA: Diagnosis not present

## 2016-09-20 DIAGNOSIS — M109 Gout, unspecified: Secondary | ICD-10-CM | POA: Diagnosis not present

## 2016-09-20 DIAGNOSIS — M48061 Spinal stenosis, lumbar region without neurogenic claudication: Secondary | ICD-10-CM | POA: Diagnosis not present

## 2016-09-20 DIAGNOSIS — Z950 Presence of cardiac pacemaker: Secondary | ICD-10-CM | POA: Diagnosis not present

## 2016-09-20 DIAGNOSIS — M549 Dorsalgia, unspecified: Secondary | ICD-10-CM | POA: Diagnosis not present

## 2016-09-22 ENCOUNTER — Telehealth: Payer: Self-pay | Admitting: Family Medicine

## 2016-09-22 DIAGNOSIS — M48061 Spinal stenosis, lumbar region without neurogenic claudication: Secondary | ICD-10-CM | POA: Diagnosis not present

## 2016-09-22 DIAGNOSIS — Z950 Presence of cardiac pacemaker: Secondary | ICD-10-CM | POA: Diagnosis not present

## 2016-09-22 DIAGNOSIS — M109 Gout, unspecified: Secondary | ICD-10-CM | POA: Diagnosis not present

## 2016-09-22 DIAGNOSIS — M549 Dorsalgia, unspecified: Secondary | ICD-10-CM | POA: Diagnosis not present

## 2016-09-22 NOTE — Telephone Encounter (Signed)
Jordan [name] wants one more OT visit. ep

## 2016-09-23 ENCOUNTER — Telehealth: Payer: Self-pay | Admitting: *Deleted

## 2016-09-23 DIAGNOSIS — M549 Dorsalgia, unspecified: Secondary | ICD-10-CM | POA: Diagnosis not present

## 2016-09-23 DIAGNOSIS — M109 Gout, unspecified: Secondary | ICD-10-CM | POA: Diagnosis not present

## 2016-09-23 DIAGNOSIS — M48061 Spinal stenosis, lumbar region without neurogenic claudication: Secondary | ICD-10-CM | POA: Diagnosis not present

## 2016-09-23 DIAGNOSIS — Z950 Presence of cardiac pacemaker: Secondary | ICD-10-CM | POA: Diagnosis not present

## 2016-09-23 NOTE — Telephone Encounter (Signed)
Home PT called and today is discharge day from PT.  Pt continues to have moderate to severe pain in back with little effect from topical or oral medications.  Pt reports some reduction in pain after exercise. Pt has has 30 point systolic drop in BP when moving from seated to standing message was deliver to Dr. Nadyne Coombes (cardiology).  You can reach [employer] @ [phone]. Tyriana Helmkamp, [name], CMA

## 2016-09-23 NOTE — Telephone Encounter (Signed)
Will forward to MD. [name],CMA  

## 2016-09-27 NOTE — Telephone Encounter (Signed)
Jordan [name] contacted and verbal order given

## 2016-09-27 NOTE — Telephone Encounter (Signed)
Please call in verbal order for one additional home OT visit.

## 2016-09-29 DIAGNOSIS — Z950 Presence of cardiac pacemaker: Secondary | ICD-10-CM | POA: Diagnosis not present

## 2016-09-29 DIAGNOSIS — M109 Gout, unspecified: Secondary | ICD-10-CM | POA: Diagnosis not present

## 2016-09-29 DIAGNOSIS — M549 Dorsalgia, unspecified: Secondary | ICD-10-CM | POA: Diagnosis not present

## 2016-09-29 DIAGNOSIS — M48061 Spinal stenosis, lumbar region without neurogenic claudication: Secondary | ICD-10-CM | POA: Diagnosis not present

## 2016-10-09 ENCOUNTER — Other Ambulatory Visit: Payer: Self-pay | Admitting: Family Medicine

## 2016-10-20 DIAGNOSIS — H3530 Unspecified macular degeneration: Secondary | ICD-10-CM | POA: Diagnosis not present

## 2016-10-20 DIAGNOSIS — H543 Unqualified visual loss, both eyes: Secondary | ICD-10-CM | POA: Diagnosis not present

## 2016-10-25 ENCOUNTER — Telehealth: Payer: Self-pay | Admitting: *Deleted

## 2016-10-25 ENCOUNTER — Other Ambulatory Visit: Payer: Self-pay | Admitting: Family Medicine

## 2016-10-25 DIAGNOSIS — Z79899 Other long term (current) drug therapy: Secondary | ICD-10-CM

## 2016-10-25 DIAGNOSIS — M1A0210 Idiopathic chronic gout, right elbow, without tophus (tophi): Secondary | ICD-10-CM

## 2016-10-25 MED ORDER — PREDNISONE 10 MG PO TABS: ORAL_TABLET | ORAL | 0 refills | Status: DC

## 2016-10-25 NOTE — Telephone Encounter (Signed)
Swelling and pain in right elbow similar to acute gouty arthritis flare in January. Last uric Acid 9.6 in 2015. Will need to increase allopurinol if CrCL can tolerate it.  Hesitant to increase colchicine without better idea of current CrCl.   Rx prednisone 30 mg daily for 2 days beyond resolution of pain.  Patient to come in next week for BMET and serum uric acid .

## 2016-10-25 NOTE — Telephone Encounter (Signed)
Pt daughter calling in stating that pt right elbow is swollen. She said last time you prescribed prednisone and wants to know if you can do that again. Please advise and call daughter back at [phone]. Shadee Rathod Kennon Holter, CMA

## 2016-11-10 DIAGNOSIS — H04123 Dry eye syndrome of bilateral lacrimal glands: Secondary | ICD-10-CM | POA: Diagnosis not present

## 2016-11-10 DIAGNOSIS — H401132 Primary open-angle glaucoma, bilateral, moderate stage: Secondary | ICD-10-CM | POA: Diagnosis not present

## 2016-11-10 DIAGNOSIS — H353132 Nonexudative age-related macular degeneration, bilateral, intermediate dry stage: Secondary | ICD-10-CM | POA: Diagnosis not present

## 2016-12-01 ENCOUNTER — Other Ambulatory Visit: Payer: Self-pay | Admitting: Family Medicine

## 2016-12-06 ENCOUNTER — Telehealth: Payer: Self-pay | Admitting: Family Medicine

## 2016-12-06 NOTE — Telephone Encounter (Signed)
FMLA form dropped off for at front desk for completion.  Verified that patient section of form has been completed.  Last DOS/WCC with PCP was 08/11/16.  Placed form in team folder to be completed by clinical staff.  If possible, patient needs by 12/13/16.   Crista Luria

## 2016-12-06 NOTE — Telephone Encounter (Signed)
Clinical info completed on FMLA form.  Placed form in Dr. McDiarmid's box for completion.  Ottis Stain, CMA

## 2016-12-07 NOTE — Telephone Encounter (Signed)
FMLA form completed and signed by me. Form placed on Jordan [name]'s desk to contact patient's daughter, Iran Sizer (c) [phone], to pick up from [facility].   Copy of completed FMLA form submitted to be scanned into patient's medical record.

## 2016-12-07 NOTE — Telephone Encounter (Signed)
Patient's daughter Stasia Cavalier informed that FMLA is complete and ready for pickup.  Derl Barrow, RN

## 2016-12-18 ENCOUNTER — Other Ambulatory Visit: Payer: Self-pay | Admitting: Family Medicine

## 2016-12-27 DIAGNOSIS — Z950 Presence of cardiac pacemaker: Secondary | ICD-10-CM | POA: Diagnosis not present

## 2016-12-27 DIAGNOSIS — Z45010 Encounter for checking and testing of cardiac pacemaker pulse generator [battery]: Secondary | ICD-10-CM | POA: Diagnosis not present

## 2017-01-15 ENCOUNTER — Other Ambulatory Visit: Payer: Self-pay | Admitting: Family Medicine

## 2017-01-19 ENCOUNTER — Encounter: Payer: Self-pay | Admitting: Family Medicine

## 2017-01-19 ENCOUNTER — Ambulatory Visit (INDEPENDENT_AMBULATORY_CARE_PROVIDER_SITE_OTHER): Payer: Medicare Other | Admitting: Family Medicine

## 2017-01-19 VITALS — BP 110/52 | HR 75 | Temp 98.0°F | Ht 62.0 in | Wt 130.1 lb

## 2017-01-19 DIAGNOSIS — M5489 Other dorsalgia: Secondary | ICD-10-CM

## 2017-01-19 DIAGNOSIS — Z23 Encounter for immunization: Secondary | ICD-10-CM | POA: Diagnosis not present

## 2017-01-19 MED ORDER — DICLOFENAC SODIUM 1 % TD GEL: 2.0000 g | Freq: Four times a day (QID) | TRANSDERMAL | 1 refills | Status: DC

## 2017-01-19 NOTE — Progress Notes (Signed)
    Subjective:  Jordan [name] is a 83 y.o. female who presents to the [facility] today with a chief complaint of Right hip pain.   HPI:  R hip pain - chronic issue for her from arthritis - Had some worsening from baseline over the last couple of weeks - Has been using her home walker and CMA for help, no falls - Taking tylenol OTC, 2 tablets twice a day with some relief - has used percocet in the past and has been a while - has not tried voltaren gel  - No bowel or bladder incontinence, no weakness, no leg pain   ROS: Per HPI  Objective:  Physical Exam: BP (!) 110/52   Pulse 75   Temp 98 F (36.7 C) (Oral)   Ht 5\' 2"  (1.575 m)   Wt 130 lb 1.6 oz (59 kg)   SpO2 99%   BMI 23.80 kg/m   Gen: NAD, resting comfortably CV: RRR with no murmurs appreciated Pulm: NWOB, CTAB with no crackles, wheezes, or rhonchi GI: Normal bowel sounds present. Soft, Nontender, Nondistended. MSK: no edema, cyanosis, or clubbing noted. 5/5 strength in LE with sensation intact and DTRs 2+ at patellar reflex Skin: warm, dry Neuro: grossly normal, moves all extremities Psych: Normal affect and thought content   Assessment/Plan:  OSTEOARTHRITIS, MULTI SITES Per patient history her right hip pain is consistent with her long-standing osteoarthritis. Advised increasing her over-the-counter Tylenol and trying Voltaren gel in addition. Prescription for Voltaren gel sent in to patient's pharmacy. Discussed risks of opiates at length with patient's son today. He voiced good understanding of this and was in agreement with trying more conservative therapies.   Bufford Lope, DO PGY-2, [facility] 01/19/2017 3:58 PM

## 2017-01-19 NOTE — Assessment & Plan Note (Signed)
Per patient history her right hip pain is consistent with her long-standing osteoarthritis. Advised increasing her over-the-counter Tylenol and trying Voltaren gel in addition. Prescription for Voltaren gel sent in to patient's pharmacy. Discussed risks of opiates at length with patient's son today. He voiced good understanding of this and was in agreement with trying more conservative therapies.

## 2017-01-19 NOTE — Patient Instructions (Signed)
It was good to see you today!  For your hip pain - Increase your over the counter tylenol 650mg  every 8 hours as needed - Try voltaren gel in the area 4 times a day as needed  Please check-out at the front desk before leaving the clinic. Make an appointment in  4 weeks with Dr. McDiarmid.   Please bring all of your medications with you to each visit.   Sign up for My Chart to have easy access to your labs results, and communication with your primary care physician.  Feel free to call with any questions or concerns at any time, at [phone].   Take care,  Dr. Bufford Lope, [place]

## 2017-01-29 ENCOUNTER — Other Ambulatory Visit: Payer: Self-pay | Admitting: Family Medicine

## 2017-02-13 ENCOUNTER — Ambulatory Visit (INDEPENDENT_AMBULATORY_CARE_PROVIDER_SITE_OTHER): Payer: Medicare Other | Admitting: Family Medicine

## 2017-02-13 ENCOUNTER — Telehealth: Payer: Self-pay | Admitting: Family Medicine

## 2017-02-13 ENCOUNTER — Encounter: Payer: Self-pay | Admitting: Family Medicine

## 2017-02-13 VITALS — BP 110/70 | HR 64 | Temp 98.4°F | Ht 62.0 in | Wt 128.7 lb

## 2017-02-13 DIAGNOSIS — Z79899 Other long term (current) drug therapy: Secondary | ICD-10-CM | POA: Diagnosis not present

## 2017-02-13 DIAGNOSIS — M549 Dorsalgia, unspecified: Secondary | ICD-10-CM | POA: Insufficient documentation

## 2017-02-13 DIAGNOSIS — M5489 Other dorsalgia: Secondary | ICD-10-CM

## 2017-02-13 DIAGNOSIS — M1A0210 Idiopathic chronic gout, right elbow, without tophus (tophi): Secondary | ICD-10-CM | POA: Diagnosis not present

## 2017-02-13 MED ORDER — DICLOFENAC SODIUM 1 % TD GEL: 2.0000 g | Freq: Four times a day (QID) | TRANSDERMAL | 1 refills | Status: DC

## 2017-02-13 NOTE — Patient Instructions (Addendum)
Thank you for coming in to see Jordan [name] today. Please see below to review our plan for today's visit.  1. I have refilled you Voltaren Gel. Please use this up to 4 times per day on the affected area. Be sure to wash hands after applying. Use tylenol 500 mg every 6 hours as needed for pain in addition to the Voltaren gel. 2. If symptoms do not improve after 1 month, we can consider a muscle relaxer. 3. Please schedule up front an appointment with Dr. McDiarmid at his next available appointment concerning Sireen's decreased appetite.  Please call the clinic at [phone] if your symptoms worsen or you have any concerns. It was my pleasure to see you. -- Harriet [place], [place], PGY-2

## 2017-02-13 NOTE — Telephone Encounter (Signed)
Pt was seen today by Dr. Yisroel Ramming and would like the patient to follow up with Dr. McDiarmid in about 3 weeks. At this time there are no appointments for November at all. [name] would like to know if the doctor can fit her mother into something in November.Please call and let [name] know. jw

## 2017-02-13 NOTE — Progress Notes (Signed)
   Subjective:   Patient ID: Jordan [name]    DOB: February 21, 1920, 83 y.o. female   MRN: [id number]  CC: "Hip and low back pain"  HPI: [name] is a 83 y.o. female who presents to clinic today for the following:  Back pain: patient experiencing chronic pain for many years with worsening of pain for the past 2 months. Patient states pain is on the right side and "feels swollen and tender to touch." She has tried Tylenol with minimal improvement. Patient states Percocet has helped in the past but daughter is concerned due to history of constipation. Patient has been using Voltaren gel wice dailyfor the past 4 weeks with some improvement however pain relief only last for approximately 30 minutes. ROS: denies fevers or chills, chest pain, shortness of breath, nausea or vomiting, abdominal pain, diarrhea, saddle anesthesia, loss of motor function loss of sensation.  Complete ROS performed, see HPI for pertinent.  [place]: Lumbar spinal stenosis, peripheral neuropathy, DDD due to OA, gout, chronic back pain, HFpEF, HTN, CAD, hypothyroidism, Alz disease. Surgical history TAH-BSO, R-shoulder arthroplasty, pacemaker, cataracts, carpal tunnel release. Family history CKD. Smoking status reviewed. Medications reviewed.  Objective:   BP 110/70   Pulse 64   Temp 98.4 F (36.9 C) (Oral)   Ht 5\' 2"  (1.575 m)   Wt 128 lb 11.2 oz (58.4 kg)   SpO2 93%   BMI 23.54 kg/m  Vitals and nursing note reviewed.  General: frail elderly woman in wheelchair, in no acute distress with non-toxic appearance HEENT: normocephalic, atraumatic, moist mucous membranes CV: regular rate and rhythm without murmurs, rubs, or gallops, no lower extremity edema Lungs: clear to auscultation bilaterally with normal work of breathing Abdomen: soft, non-tender, non-distended, normoactive bowel sounds Skin: warm, dry, no rashes or lesions, cap refill < 2 seconds Extremities: warm and well perfused, normal tone MSK: scoliosis with mild  tenderness to right paraspinal musculature of lumbar region  Assessment & Plan:   Pain in right paraspinal region Chronic. Likely due to muscle strain from baseline scoliosis and degenerative back disease. No red flags. --Given refill for Voltaren gel with instructions to increase frequency to 4 times daily and supplement with Tylenol --Would consider muscle relaxer in one month if not improved  No orders of the defined types were placed in this encounter.  Meds ordered this encounter  Medications  . diclofenac sodium (VOLTAREN) 1 % GEL    Sig: Apply 2 g topically 4 (four) times daily.    Dispense:  100 g    Refill:  [place], [place], PGY-2 02/13/2017 5:22 PM

## 2017-02-13 NOTE — Assessment & Plan Note (Addendum)
Chronic. Likely due to muscle strain from baseline scoliosis and degenerative back disease. No red flags. --Given refill for Voltaren gel with instructions to increase frequency to 4 times daily and supplement with Tylenol --Would consider muscle relaxer in one month if not improved

## 2017-02-13 NOTE — Telephone Encounter (Signed)
Will forward to MD to advise. Caragh Gasper,CMA  

## 2017-02-14 ENCOUNTER — Telehealth: Payer: Self-pay | Admitting: Licensed Clinical Social Worker

## 2017-02-14 LAB — BASIC METABOLIC PANEL
BUN / CREAT RATIO: 16 (ref 12–28)
BUN: 18 mg/dL (ref 10–36)
CO2: 31 mmol/L — ABNORMAL HIGH (ref 20–29)
CREATININE: 1.15 mg/dL — AB (ref 0.57–1.00)
Calcium: 9 mg/dL (ref 8.7–10.3)
Chloride: 95 mmol/L — ABNORMAL LOW (ref 96–106)
GFR calc Af Amer: 47 mL/min/{1.73_m2} — ABNORMAL LOW (ref >59)
GFR calc non Af Amer: 41 mL/min/{1.73_m2} — ABNORMAL LOW (ref >59)
Glucose: 85 mg/dL (ref 65–99)
Potassium: 5 mmol/L (ref 3.5–5.2)
SODIUM: 137 mmol/L (ref 134–144)

## 2017-02-14 LAB — URIC ACID: Uric Acid: 3.5 mg/dL (ref 2.5–7.1)

## 2017-02-14 NOTE — Progress Notes (Signed)
LCSW received message that patient's daughter Stasia Cavalier [phone] called requesting information and assistance with locating a CNA for patient.    Called daughter to provide information on [email or web address] , which will provide her with a list of agencies in her area based on zip code.  Left message to call LCSW  Plan: LCSW will wait for return call.   [name], LCSW Licensed Clinical Social Worker [place]   [phone] 1:53 PM

## 2017-02-15 NOTE — Telephone Encounter (Signed)
Spoke with [facility] and informed her that patient has been scheduled for 03/02/17 and also informed her of labs. [name],CMA

## 2017-02-15 NOTE — Telephone Encounter (Signed)
Please put Ms Vivero in [facility] or double book her in one of my November continuity clinics.

## 2017-02-16 NOTE — Progress Notes (Signed)
LCSW returned call from patient's daughter Jordan [name] [phone].  She explained that patient is currently on the CAP program and her spanish speaking CNA is no longer able to provide services for her.  Daughter is asking for assistance in locating a spanish speaking CNA. Issues discussed:  Resources family has explored, 41 services ;  Seniors Resources and  [employer]gov  Intervention: Reflective listening and provided [employer]    PLAN: 1. Daughter will contact resources provided 2. Daughter will call LCSW if she needs additional resources or informaton 3. Referral: 211 services ;  Seniors Resources and  [employer]gov  4. No other services needs at this time  Jordan [name], [place] Licensed Clinical Social Worker [place]   [phone] 9:12 AM

## 2017-02-22 DIAGNOSIS — Z8679 Personal history of other diseases of the circulatory system: Secondary | ICD-10-CM | POA: Diagnosis not present

## 2017-02-22 DIAGNOSIS — Z45018 Encounter for adjustment and management of other part of cardiac pacemaker: Secondary | ICD-10-CM | POA: Diagnosis not present

## 2017-02-22 DIAGNOSIS — I480 Paroxysmal atrial fibrillation: Secondary | ICD-10-CM | POA: Diagnosis not present

## 2017-02-22 DIAGNOSIS — Z950 Presence of cardiac pacemaker: Secondary | ICD-10-CM | POA: Diagnosis not present

## 2017-03-02 ENCOUNTER — Ambulatory Visit: Payer: Self-pay

## 2017-03-02 NOTE — Progress Notes (Deleted)
   Subjective:    Patient ID: Jordan [name], female    DOB: [birth year], 83 y.o.   MRN: [id number]   CC:  HPI:   Smoking status reviewed  Review of Systems   Objective:  There were no vitals taken for this visit. Vitals and nursing note reviewed  General: well nourished, in no acute distress HEENT: normocephalic, TM's visualized bilaterally, no scleral icterus or conjunctival pallor, no nasal discharge, moist mucous membranes, good dentition without erythema or discharge noted in posterior oropharynx Neck: supple, non-tender, without lymphadenopathy Cardiac: RRR, clear S1 and S2, no murmurs, rubs, or gallops Respiratory: clear to auscultation bilaterally, no increased work of breathing Abdomen: soft, nontender, nondistended, no masses or organomegaly. Bowel sounds present Extremities: no edema or cyanosis. Warm, well perfused. 2+ radial and PT pulses bilaterally Skin: warm and dry, no rashes noted Neuro: alert and oriented, no focal deficits   Assessment & Plan:    No problem-specific Assessment & Plan notes found for this encounter.    No Follow-up on file.   Caroline More, DO, PGY-1

## 2017-03-13 ENCOUNTER — Other Ambulatory Visit: Payer: Self-pay | Admitting: Family Medicine

## 2017-03-13 MED ORDER — LEVOTHYROXINE SODIUM 100 MCG PO TABS: 100.0000 ug | ORAL_TABLET | Freq: Every day | ORAL | 1 refills | Status: DC

## 2017-03-14 ENCOUNTER — Encounter: Payer: Self-pay | Admitting: *Deleted

## 2017-04-06 ENCOUNTER — Ambulatory Visit (INDEPENDENT_AMBULATORY_CARE_PROVIDER_SITE_OTHER): Payer: Medicare Other | Admitting: Family Medicine

## 2017-04-06 ENCOUNTER — Other Ambulatory Visit: Payer: Self-pay

## 2017-04-06 VITALS — BP 110/60 | HR 57 | Temp 98.8°F | Ht 62.0 in | Wt 130.0 lb

## 2017-04-06 DIAGNOSIS — E038 Other specified hypothyroidism: Secondary | ICD-10-CM | POA: Diagnosis not present

## 2017-04-06 DIAGNOSIS — Z79891 Long term (current) use of opiate analgesic: Secondary | ICD-10-CM | POA: Diagnosis not present

## 2017-04-06 DIAGNOSIS — G894 Chronic pain syndrome: Secondary | ICD-10-CM

## 2017-04-06 DIAGNOSIS — M546 Pain in thoracic spine: Secondary | ICD-10-CM

## 2017-04-06 DIAGNOSIS — M48061 Spinal stenosis, lumbar region without neurogenic claudication: Secondary | ICD-10-CM | POA: Diagnosis not present

## 2017-04-06 DIAGNOSIS — G8929 Other chronic pain: Secondary | ICD-10-CM | POA: Diagnosis not present

## 2017-04-06 MED ORDER — OXYCODONE HCL 5 MG PO TABS: 2.5000 mg | ORAL_TABLET | ORAL | 0 refills | Status: DC | PRN

## 2017-04-06 NOTE — Patient Instructions (Signed)
Start the new pain medicine called Oxycodone.  It is a 5 mg tablet.  Take half a tablet every four hours if you need it for pain that is not relieved by your Tylenol.   Watch out for constipation or the medicine making you confused.    We are checking your thyroid level today.  If it is in a good range, then continue taking the levothyroxine with the esomeprazole in the morning. If it is not in range, then we may suggest taking the esomeprazole at a different time of day.

## 2017-04-07 ENCOUNTER — Encounter: Payer: Self-pay | Admitting: Family Medicine

## 2017-04-07 DIAGNOSIS — Z79891 Long term (current) use of opiate analgesic: Secondary | ICD-10-CM | POA: Insufficient documentation

## 2017-04-07 LAB — TSH: TSH: 1.04 u[IU]/mL (ref 0.450–4.500)

## 2017-04-07 NOTE — Assessment & Plan Note (Signed)
Established problem Jordan [name] is taking her LT4 in morning with omeprazole. Will check TSH. If TSH is in normal limits, will be OK to continue this practice of taking LT4 with omeprazole. If not in range, then will need to move omeprazole dose to another time of day then rechceck TSH in 4-6 weeks.

## 2017-04-07 NOTE — Progress Notes (Signed)
   Subjective:    Patient ID: Jordan [name], female    DOB: 11-May-1919, 83 y.o.   MRN: [id number] Jordan [name] is accompanied by son Sources of clinical information for visit is/are patient, relative(s) and past medical records. son insisted on translating Nursing assessment for this office visit was reviewed with the patient for accuracy and revision.  Depression screen PHQ 2/9 04/06/2017  Decreased Interest 0  Down, Depressed, Hopeless 0  PHQ - 2 Score 0  Altered sleeping -  Tired, decreased energy -  Change in appetite -  Feeling bad or failure about yourself  -  Trouble concentrating -  Moving slowly or fidgety/restless -  Suicidal thoughts -  PHQ-9 Score -  Some recent data might be hidden   Fall Risk  04/06/2017 02/13/2017 01/19/2017 09/01/2016 05/26/2016  Falls in the past year? No No No Yes Yes  Number falls in past yr: - - - 1 1  Comment - - - - -  Injury with Fall? - - - No No  Comment - - - - -  Risk Factor Category  - - - - -  Risk for fall due to : - - - - -  Risk for fall due to: Comment - - - - -  Follow up - - - - -    HPI   BACK PAIN  Location: bilateral lower back and buttocks  Quality: aching  Onset: years ago Worse with: movement  Better with: rest, apap, nsaid  Radiation: buttocks  Trauma: no Best sitting/standing/leaning forward: no change  Red Flags Fecal/urinary incontinence: no  Numbness/Weakness: no  Fever/chills/sweats: no  Night pain: no  Unexplained weight loss: no  No relief with bedrest: yes  h/o cancer/immunosuppression: no  IV drug use: no  PMH of osteoporosis or chronic steroid use: yes, hx vertebral collapse, lumbar spinal stenosis, chronic pain syndrome  SH: no smoking  Review of Systems See HPI    Objective:   Physical Exam VS reviewed GEN: Alert, Cooperative, Groomed, NAD, , patient seated in wheel chair HEENT: PERRL; EAC bilaterally not occluded, TM's translucent with normal LM COR: RRR, sternal border systolic murmur with  rad into left carotid, No JVD,  LUNGS: BCTA, No Acc mm use, speaking in full sentences ABDOMEN: (+)BS, soft, NT, N EXT: No peripheral leg edema. No pain with internal and external rotation of hips BACK: TTP Bilateral SI angles and upper buttocks bilaterally R>L SKIN: No lesion nor rashes of  back Neuro: 5/5 ankle PF/DF Psych: affect warm/thought-purposeful//language concrete    Assessment & Plan:  See problem list 30 minutes face to face where spent in total with counseling / coordination of care took more than 50% of the total time. Counseling involved discussion differential diagnosis of back pain, testing, prognosis for back pain, adherence, adverse effects of opiates, benefits of opiate treatment, instructions, compliance importance.

## 2017-04-07 NOTE — Assessment & Plan Note (Signed)
Established problem Chronic mixed pain of OA nocioception, neurogenic pain of Lumbar stenosis, and sensory (central) hypersensitization from chronic pain  Is there comorbid mental health disorders/issues: yes, dementia  Therapy:  Nonpharmacologic therapies: rest, heat/ice and therapy/exercise  Adequate analgesia: no, patient remains in pain.  Not a candidate for epidural, fascet injection, etc because of anticoagulation, Not TENS unit candidate bc of pacemaker. No NSAIDs bc of anticoagulation. PT has been tried to help with positioning with little success.  Activity benefit: iADLs: no;  ADLs: yes, son reports that if pain controlled with opiates in past she could participate better ADLs medication Adverse effects: yes Continue current therapy:  Scheduled apap     Nocioceptive pain: analgesic: trial of oxycodone 5 mg tablet disp#30/month, half (2.5 mg) tablet every four hours if needed for pain to improve function, Chondroitin/Glucosamine     Neurogenic pain: Duloxetine and gabapentin     Central sensitization: Duloxetine

## 2017-04-12 ENCOUNTER — Other Ambulatory Visit: Payer: Self-pay | Admitting: Family Medicine

## 2017-04-12 DIAGNOSIS — N3946 Mixed incontinence: Secondary | ICD-10-CM

## 2017-04-12 MED ORDER — FESOTERODINE FUMARATE ER 4 MG PO TB24: 4.0000 mg | ORAL_TABLET | Freq: Every day | ORAL | 99 refills | Status: DC

## 2017-05-29 DIAGNOSIS — H401132 Primary open-angle glaucoma, bilateral, moderate stage: Secondary | ICD-10-CM | POA: Diagnosis not present

## 2017-05-30 ENCOUNTER — Telehealth: Payer: Self-pay | Admitting: Family Medicine

## 2017-05-30 ENCOUNTER — Other Ambulatory Visit: Payer: Self-pay | Admitting: Family Medicine

## 2017-05-30 DIAGNOSIS — Z8679 Personal history of other diseases of the circulatory system: Secondary | ICD-10-CM | POA: Diagnosis not present

## 2017-05-30 DIAGNOSIS — R300 Dysuria: Secondary | ICD-10-CM

## 2017-05-30 DIAGNOSIS — Z950 Presence of cardiac pacemaker: Secondary | ICD-10-CM | POA: Diagnosis not present

## 2017-05-30 DIAGNOSIS — Z45018 Encounter for adjustment and management of other part of cardiac pacemaker: Secondary | ICD-10-CM | POA: Diagnosis not present

## 2017-05-30 LAB — POCT URINALYSIS DIP (MANUAL ENTRY)
Bilirubin, UA: NEGATIVE
GLUCOSE UA: NEGATIVE mg/dL
NITRITE UA: NEGATIVE
Spec Grav, UA: 1.02 (ref 1.010–1.025)
UROBILINOGEN UA: 0.2 U/dL
pH, UA: 6 (ref 5.0–8.0)

## 2017-05-30 LAB — POCT UA - MICROSCOPIC ONLY: WBC, Ur, HPF, POC: >20

## 2017-05-30 MED ORDER — CEPHALEXIN 500 MG PO CAPS: 500.0000 mg | ORAL_CAPSULE | Freq: Two times a day (BID) | ORAL | 0 refills | Status: DC

## 2017-05-30 NOTE — Telephone Encounter (Signed)
Jordan [name] is calling back to inform the doctor that her mother's BP was 94/42 the morning while still in bed.She is also wondering if Dr. McDiarmid could see her mother sooner than Thursday. jw

## 2017-05-30 NOTE — Telephone Encounter (Signed)
Pts daughter would prefer to come by the office to pick up a urine cup for her mother. All supplies have been left up front for pick up.

## 2017-05-30 NOTE — Telephone Encounter (Signed)
**  After Hours/ Emergency Line Call*  Received a call to report that Jordan [name] is having darker urine than usual with an odor. Patient has been having back pain, but that is normal for her per her daughter. No fevers at home. Her daughter is calling and states that Dr. McDiarmid wanted to get a urine sample. Her daughter states that she dropped off the urine sample today and is wondering what the results are.   UA showing moderate leukocytes, >300 protein, 3+ bacteria, and large hgb.   Discussed that we typically do not prescribe antibiotics on the after hours line but given that patient does have infected looking urine today and she is 84 yo with difficulty getting to the office, will treat with Keflex. Recommended that she follow up with Dr. McDiarmid within the next couple days.  Red flags discussed.  Will forward to PCP.  Carlyle Dolly, MD PGY-3, [facility]

## 2017-05-30 NOTE — Telephone Encounter (Signed)
Pt daughter says her husband should have already dropped off the pee cup.

## 2017-05-30 NOTE — Telephone Encounter (Signed)
Please ask Ms Boyland's dgt to bring in urine specimen.  The urine sample of at least two ounces can be brought in to office in glass jar.  Sterilize the jar as described before collecting uring.   Microwave sterilizing Wash the jar as usual, then fill half of the jar with tap water.  Place the jar upright inside the microwave.  Microwave for approximately a minute and a half.  Pour out water, allow jar to cool for 10 to 15 minutes before using to collect urine sample.    Bring jar with sample to our office lab for analysis.

## 2017-05-30 NOTE — Telephone Encounter (Signed)
Orders for urinalysis with micro and Urine culture have been submitted for analysis of patient urine specimen brought from home.

## 2017-05-30 NOTE — Telephone Encounter (Signed)
Pt daughter called thinking her mom has a UTI. She is having body aches, urine has a smell, lower back hurting her more than usual. She would like to know if McDiarmid would send in an antibiotic to [facility] at [place]. She said she could also come by the office and pick up a pee cup for her mother and bring it back to be checked. Pt comes in on Thursday for an appt and she doesn't want to have to come twice this week. Please advise

## 2017-05-31 ENCOUNTER — Emergency Department (HOSPITAL_COMMUNITY): Payer: Medicare Other

## 2017-05-31 ENCOUNTER — Inpatient Hospital Stay (HOSPITAL_COMMUNITY)
Admission: EM | Admit: 2017-05-31 | Discharge: 2017-06-02 | DRG: 872 | Disposition: A | Discharge status: Home / Self Care | Payer: Medicare Other | Attending: Family Medicine | Admitting: Family Medicine

## 2017-05-31 ENCOUNTER — Telehealth: Payer: Self-pay | Admitting: Family Medicine

## 2017-05-31 ENCOUNTER — Other Ambulatory Visit: Payer: Self-pay

## 2017-05-31 ENCOUNTER — Encounter (HOSPITAL_COMMUNITY): Payer: Self-pay

## 2017-05-31 DIAGNOSIS — E871 Hypo-osmolality and hyponatremia: Secondary | ICD-10-CM | POA: Diagnosis present

## 2017-05-31 DIAGNOSIS — M1990 Unspecified osteoarthritis, unspecified site: Secondary | ICD-10-CM | POA: Diagnosis present

## 2017-05-31 DIAGNOSIS — F0280 Dementia in other diseases classified elsewhere without behavioral disturbance: Secondary | ICD-10-CM | POA: Diagnosis present

## 2017-05-31 DIAGNOSIS — S99919A Unspecified injury of unspecified ankle, initial encounter: Secondary | ICD-10-CM | POA: Diagnosis present

## 2017-05-31 DIAGNOSIS — A4151 Sepsis due to Escherichia coli [E. coli]: Secondary | ICD-10-CM | POA: Diagnosis not present

## 2017-05-31 DIAGNOSIS — R0989 Other specified symptoms and signs involving the circulatory and respiratory systems: Secondary | ICD-10-CM | POA: Diagnosis not present

## 2017-05-31 DIAGNOSIS — Z888 Allergy status to other drugs, medicaments and biological substances status: Secondary | ICD-10-CM

## 2017-05-31 DIAGNOSIS — N179 Acute kidney failure, unspecified: Secondary | ICD-10-CM | POA: Diagnosis not present

## 2017-05-31 DIAGNOSIS — H04123 Dry eye syndrome of bilateral lacrimal glands: Secondary | ICD-10-CM | POA: Diagnosis present

## 2017-05-31 DIAGNOSIS — F329 Major depressive disorder, single episode, unspecified: Secondary | ICD-10-CM | POA: Diagnosis present

## 2017-05-31 DIAGNOSIS — D696 Thrombocytopenia, unspecified: Secondary | ICD-10-CM | POA: Diagnosis present

## 2017-05-31 DIAGNOSIS — I4891 Unspecified atrial fibrillation: Secondary | ICD-10-CM | POA: Diagnosis present

## 2017-05-31 DIAGNOSIS — S92353A Displaced fracture of fifth metatarsal bone, unspecified foot, initial encounter for closed fracture: Secondary | ICD-10-CM | POA: Diagnosis present

## 2017-05-31 DIAGNOSIS — Z79890 Hormone replacement therapy (postmenopausal): Secondary | ICD-10-CM

## 2017-05-31 DIAGNOSIS — E039 Hypothyroidism, unspecified: Secondary | ICD-10-CM | POA: Diagnosis present

## 2017-05-31 DIAGNOSIS — M1000 Idiopathic gout, unspecified site: Secondary | ICD-10-CM | POA: Diagnosis present

## 2017-05-31 DIAGNOSIS — Z950 Presence of cardiac pacemaker: Secondary | ICD-10-CM

## 2017-05-31 DIAGNOSIS — S92351D Displaced fracture of fifth metatarsal bone, right foot, subsequent encounter for fracture with routine healing: Secondary | ICD-10-CM | POA: Insufficient documentation

## 2017-05-31 DIAGNOSIS — Z90722 Acquired absence of ovaries, bilateral: Secondary | ICD-10-CM

## 2017-05-31 DIAGNOSIS — Z91048 Other nonmedicinal substance allergy status: Secondary | ICD-10-CM

## 2017-05-31 DIAGNOSIS — R531 Weakness: Secondary | ICD-10-CM | POA: Diagnosis not present

## 2017-05-31 DIAGNOSIS — N3001 Acute cystitis with hematuria: Secondary | ICD-10-CM | POA: Diagnosis present

## 2017-05-31 DIAGNOSIS — H409 Unspecified glaucoma: Secondary | ICD-10-CM | POA: Diagnosis present

## 2017-05-31 DIAGNOSIS — I5032 Chronic diastolic (congestive) heart failure: Secondary | ICD-10-CM | POA: Diagnosis not present

## 2017-05-31 DIAGNOSIS — R404 Transient alteration of awareness: Secondary | ICD-10-CM | POA: Diagnosis not present

## 2017-05-31 DIAGNOSIS — Z841 Family history of disorders of kidney and ureter: Secondary | ICD-10-CM

## 2017-05-31 DIAGNOSIS — S92351A Displaced fracture of fifth metatarsal bone, right foot, initial encounter for closed fracture: Secondary | ICD-10-CM | POA: Diagnosis not present

## 2017-05-31 DIAGNOSIS — M810 Age-related osteoporosis without current pathological fracture: Secondary | ICD-10-CM | POA: Diagnosis present

## 2017-05-31 DIAGNOSIS — H9193 Unspecified hearing loss, bilateral: Secondary | ICD-10-CM | POA: Diagnosis present

## 2017-05-31 DIAGNOSIS — G309 Alzheimer's disease, unspecified: Secondary | ICD-10-CM | POA: Diagnosis present

## 2017-05-31 DIAGNOSIS — I700 Atherosclerosis of aorta: Secondary | ICD-10-CM | POA: Diagnosis present

## 2017-05-31 DIAGNOSIS — Z91030 Bee allergy status: Secondary | ICD-10-CM

## 2017-05-31 DIAGNOSIS — Z8673 Personal history of transient ischemic attack (TIA), and cerebral infarction without residual deficits: Secondary | ICD-10-CM

## 2017-05-31 DIAGNOSIS — Z90710 Acquired absence of both cervix and uterus: Secondary | ICD-10-CM

## 2017-05-31 DIAGNOSIS — I130 Hypertensive heart and chronic kidney disease with heart failure and stage 1 through stage 4 chronic kidney disease, or unspecified chronic kidney disease: Secondary | ICD-10-CM | POA: Diagnosis not present

## 2017-05-31 DIAGNOSIS — K5909 Other constipation: Secondary | ICD-10-CM | POA: Diagnosis present

## 2017-05-31 DIAGNOSIS — G894 Chronic pain syndrome: Secondary | ICD-10-CM | POA: Diagnosis present

## 2017-05-31 DIAGNOSIS — N390 Urinary tract infection, site not specified: Secondary | ICD-10-CM | POA: Diagnosis present

## 2017-05-31 DIAGNOSIS — H547 Unspecified visual loss: Secondary | ICD-10-CM | POA: Diagnosis present

## 2017-05-31 DIAGNOSIS — Z96611 Presence of right artificial shoulder joint: Secondary | ICD-10-CM | POA: Diagnosis present

## 2017-05-31 DIAGNOSIS — K219 Gastro-esophageal reflux disease without esophagitis: Secondary | ICD-10-CM | POA: Diagnosis present

## 2017-05-31 DIAGNOSIS — X501XXA Overexertion from prolonged static or awkward postures, initial encounter: Secondary | ICD-10-CM

## 2017-05-31 DIAGNOSIS — Z7901 Long term (current) use of anticoagulants: Secondary | ICD-10-CM

## 2017-05-31 DIAGNOSIS — N183 Chronic kidney disease, stage 3 (moderate): Secondary | ICD-10-CM | POA: Diagnosis present

## 2017-05-31 DIAGNOSIS — Z1629 Resistance to other single specified antibiotic: Secondary | ICD-10-CM | POA: Diagnosis present

## 2017-05-31 LAB — URINALYSIS, MICROSCOPIC (REFLEX)

## 2017-05-31 LAB — CBC WITH DIFFERENTIAL/PLATELET
BASOS ABS: 0 10*3/uL (ref 0.0–0.1)
BASOS PCT: 0 %
Eosinophils Absolute: 0 10*3/uL (ref 0.0–0.7)
Eosinophils Relative: 0 %
HEMATOCRIT: 31.8 % — AB (ref 36.0–46.0)
Hemoglobin: 10.3 g/dL — ABNORMAL LOW (ref 12.0–15.0)
LYMPHS PCT: 11 %
Lymphs Abs: 1.9 10*3/uL (ref 0.7–4.0)
MCH: 31.9 pg (ref 26.0–34.0)
MCHC: 32.4 g/dL (ref 30.0–36.0)
MCV: 98.5 fL (ref 78.0–100.0)
Monocytes Absolute: 1 10*3/uL (ref 0.1–1.0)
Monocytes Relative: 6 %
NEUTROS PCT: 83 %
Neutro Abs: 14.8 10*3/uL — ABNORMAL HIGH (ref 1.7–7.7)
Platelets: 104 10*3/uL — ABNORMAL LOW (ref 150–400)
RBC: 3.23 MIL/uL — AB (ref 3.87–5.11)
RDW: 14.1 % (ref 11.5–15.5)
WBC: 17.8 10*3/uL — AB (ref 4.0–10.5)

## 2017-05-31 LAB — COMPREHENSIVE METABOLIC PANEL
ALBUMIN: 2.6 g/dL — AB (ref 3.5–5.0)
ALT: 25 U/L (ref 14–54)
AST: 37 U/L (ref 15–41)
Alkaline Phosphatase: 98 U/L (ref 38–126)
Anion gap: 9 (ref 5–15)
BILIRUBIN TOTAL: 0.4 mg/dL (ref 0.3–1.2)
BUN: 38 mg/dL — AB (ref 6–20)
CHLORIDE: 96 mmol/L — AB (ref 101–111)
CO2: 25 mmol/L (ref 22–32)
CREATININE: 2.41 mg/dL — AB (ref 0.44–1.00)
Calcium: 7.9 mg/dL — ABNORMAL LOW (ref 8.9–10.3)
GFR calc Af Amer: 19 mL/min — ABNORMAL LOW (ref >60)
GFR, EST NON AFRICAN AMERICAN: 16 mL/min — AB (ref >60)
GLUCOSE: 99 mg/dL (ref 65–99)
Potassium: 4.7 mmol/L (ref 3.5–5.1)
Sodium: 130 mmol/L — ABNORMAL LOW (ref 135–145)
TOTAL PROTEIN: 5.3 g/dL — AB (ref 6.5–8.1)

## 2017-05-31 LAB — URINALYSIS, ROUTINE W REFLEX MICROSCOPIC
Bilirubin Urine: NEGATIVE
GLUCOSE, UA: NEGATIVE mg/dL
Ketones, ur: NEGATIVE mg/dL
Nitrite: NEGATIVE
PROTEIN: 30 mg/dL — AB
Specific Gravity, Urine: <1.005 — ABNORMAL LOW (ref 1.005–1.030)
pH: 6 (ref 5.0–8.0)

## 2017-05-31 LAB — I-STAT CG4 LACTIC ACID, ED: Lactic Acid, Venous: 0.89 mmol/L (ref 0.5–1.9)

## 2017-05-31 MED ORDER — COLCHICINE 0.6 MG PO TABS
0.6000 mg | ORAL_TABLET | Freq: Two times a day (BID) | ORAL | Status: DC
  Administered 2017-05-31 – 2017-06-02 (×4): 0.6 mg via ORAL
  Filled 2017-05-31 (×4): qty 1

## 2017-05-31 MED ORDER — METOPROLOL SUCCINATE ER 25 MG PO TB24
25.0000 mg | ORAL_TABLET | Freq: Every day | ORAL | Status: DC
  Administered 2017-05-31 – 2017-06-02 (×3): 25 mg via ORAL
  Filled 2017-05-31 (×2): qty 1

## 2017-05-31 MED ORDER — DOCUSATE SODIUM 100 MG PO CAPS: 100.0000 mg | ORAL_CAPSULE | Freq: Every day | ORAL | Status: DC | PRN

## 2017-05-31 MED ORDER — TRAZODONE HCL 50 MG PO TABS
50.0000 mg | ORAL_TABLET | Freq: Every day | ORAL | Status: DC
  Administered 2017-05-31 – 2017-06-01 (×2): 50 mg via ORAL
  Filled 2017-05-31 (×2): qty 1

## 2017-05-31 MED ORDER — LEVOTHYROXINE SODIUM 100 MCG PO TABS
100.0000 ug | ORAL_TABLET | Freq: Every day | ORAL | Status: DC
  Administered 2017-06-01 – 2017-06-02 (×2): 100 ug via ORAL
  Filled 2017-05-31 (×2): qty 1

## 2017-05-31 MED ORDER — FESOTERODINE FUMARATE ER 4 MG PO TB24
4.0000 mg | ORAL_TABLET | Freq: Every day | ORAL | Status: DC
  Administered 2017-06-01 – 2017-06-02 (×2): 4 mg via ORAL
  Filled 2017-05-31 (×2): qty 1

## 2017-05-31 MED ORDER — DEXTROSE 5 % IV SOLN
1.0000 g | Freq: Once | INTRAVENOUS | Status: AC
  Administered 2017-05-31: 1 g via INTRAVENOUS
  Filled 2017-05-31: qty 10

## 2017-05-31 MED ORDER — CYCLOSPORINE 0.05 % OP EMUL
1.0000 [drp] | Freq: Every day | OPHTHALMIC | Status: DC
  Administered 2017-06-01 (×2): 1 [drp] via OPHTHALMIC
  Filled 2017-05-31 (×5): qty 1

## 2017-05-31 MED ORDER — ACETAMINOPHEN 500 MG PO TABS
1000.0000 mg | ORAL_TABLET | Freq: Three times a day (TID) | ORAL | Status: DC | PRN
  Administered 2017-05-31 – 2017-06-01 (×2): 1000 mg via ORAL
  Filled 2017-05-31 (×2): qty 2

## 2017-05-31 MED ORDER — DEXTROSE 5 % IV SOLN: 1.0000 g | INTRAVENOUS | Status: DC

## 2017-05-31 MED ORDER — LATANOPROST 0.005 % OP SOLN
1.0000 [drp] | Freq: Every day | OPHTHALMIC | Status: DC
Start: 2017-05-31 — End: 2017-06-02
  Administered 2017-06-01 (×2): 1 [drp] via OPHTHALMIC
  Filled 2017-05-31 (×2): qty 2.5

## 2017-05-31 MED ORDER — SODIUM CHLORIDE 0.9 % IV BOLUS (SEPSIS)
1000.0000 mL | Freq: Once | INTRAVENOUS | Status: AC
  Administered 2017-05-31: 1000 mL via INTRAVENOUS

## 2017-05-31 MED ORDER — PANTOPRAZOLE SODIUM 40 MG PO TBEC
40.0000 mg | DELAYED_RELEASE_TABLET | Freq: Every day | ORAL | Status: DC
  Administered 2017-06-01 – 2017-06-02 (×2): 40 mg via ORAL
  Filled 2017-05-31 (×2): qty 1

## 2017-05-31 MED ORDER — ALLOPURINOL 100 MG PO TABS
100.0000 mg | ORAL_TABLET | Freq: Every day | ORAL | Status: DC
  Administered 2017-06-01 – 2017-06-02 (×2): 100 mg via ORAL
  Filled 2017-05-31 (×2): qty 1

## 2017-05-31 MED ORDER — SODIUM CHLORIDE 0.9 % IV SOLN
INTRAVENOUS | Status: DC
  Administered 2017-05-31 – 2017-06-01 (×2): via INTRAVENOUS

## 2017-05-31 MED ORDER — POLYETHYLENE GLYCOL 3350 17 G PO PACK: 17.0000 g | PACK | Freq: Every day | ORAL | Status: DC | PRN

## 2017-05-31 MED ORDER — GABAPENTIN 100 MG PO CAPS
200.0000 mg | ORAL_CAPSULE | Freq: Two times a day (BID) | ORAL | Status: DC
  Administered 2017-05-31 – 2017-06-02 (×4): 200 mg via ORAL
  Filled 2017-05-31 (×4): qty 2

## 2017-05-31 MED ORDER — APIXABAN 2.5 MG PO TABS
2.5000 mg | ORAL_TABLET | Freq: Two times a day (BID) | ORAL | Status: DC
  Administered 2017-05-31 – 2017-06-02 (×4): 2.5 mg via ORAL
  Filled 2017-05-31 (×4): qty 1

## 2017-05-31 NOTE — ED Notes (Signed)
Pt provided with ice water, saltine crackers, and chicken broth.

## 2017-05-31 NOTE — Telephone Encounter (Signed)
Pt daughter wanted to inform McDiarmid that this pt is at the ED right now and being admitted.

## 2017-05-31 NOTE — ED Notes (Signed)
Xray bedside.

## 2017-05-31 NOTE — ED Notes (Signed)
Pt provided with bedside commode per family request

## 2017-05-31 NOTE — ED Notes (Signed)
Attempting to obtain urine sample at this time

## 2017-05-31 NOTE — Telephone Encounter (Signed)
pts blood pressure is 82/42 this morning.  Mom has been in bed all night. She is still not feeling well.  Oxy level is 82 and pulse was 70. Please advise

## 2017-05-31 NOTE — ED Notes (Signed)
Per family, pt was placed on antibiotic 500 ml Keflex yesterday for her UTI. Began antibiotic yesterday and took it this morning pta

## 2017-05-31 NOTE — ED Triage Notes (Signed)
Pt presents to the ed with complaints of generalized body aches, generalized weakness and taking an at home UTI test and it was positive so they brought her here. The patient was hypotensive with EMS [phone] and now is 98/45. Per pts family she has had a lower blood pressure at home x 2 days and has been acting frustrated. Pt is spanish speaking and was offered a Optometrist and refused, requesting family to translate.

## 2017-05-31 NOTE — H&P (Signed)
[facility] Admission History and Physical Service Pager: [phone]  [name] Medical record number: [id number] Date of birth: 03-21-1920 Age: 84 y.o. Gender: female  Primary Care Provider: McDiarmid, Blane Ohara, MD Consultants: none Code Status: full pending family discussion with pt's son  Chief Complaint: fatigue, urinary changes  Assessment and Plan: Jordan [name] is a 84 y.o. female presenting with urinary changes, malaise, and low BP recordings at home . PMH is significant for HTN, hypothyroidism, Alzheimer's dementia, HFpEF (EF 2016 60-65%), CKD III, heart block s/p pacemaker placement in 2015, and osteoarthritis.   Acute Cystitis  R/o Sepsis  Leukocytosis: Patient presents with AMS, malaise, and poor PO intake in the setting of clinic UA with >20 WBCs, large blood, moderate leukocytes concerning for infectious process (similar on admission). Daughter reports pt's BP's at home 32-91B systolic. On admission, VS normal and stable and exam without suprapubic or CVA tenderness and no focal signs of infection. Admission labs notable for lactate wnl, WBC 17.8, Cr 2.6 (b/l ~1.1-1.3). Prev urine cultures with E. Faecalis 04/18 (resistant to tetracyclines, otherwise pan-sensitive) and pan-sensitive E. Coli 01/18. Low suspicion for sepsis given patient normotensive since admission, normal lactate, afebrile, otherwise well-appearing. Less likely pyelonephritis given lack of CVA tenderness but cannot r/o given leukocytosis and back pain. Hypotension at home likely related to poor PO intake. Will treat for presumed UTI and symptomatically manage fluid status. UA with trace ketones likely related to some dehydration.  -admit to MedSurg, vital signs per unit  - F/u Bcx x 2 - continue CTX 1g q24H (1/30 - ), s/p one dose cephalexin 1/29 - F/u urine cx obtained at clinic prior to initiation of abx and will transition to PO abx pending speciation and sensitivities  - NS  @ 100cc/hr x24h (s/p 1 L NS in ED) and encouraged PO intake  -monitor UOP  - acetaminophen 1g Q8H prn pain - continue home fesoterodine for urinary incontinence  CKD Stage III  AKI: Patient presents with Cr 2.41 (baseline ~1.1-1.3), eGFR 16. Likely prerenal given poor PO intake, possibly with some element of worsening baseline CKD.  - Daily BMP, CTM - IVF as above - Renally dose meds as needed and avoid nephrotoxic agents -hold Cymbalta in setting of AKI   Hypovolemic Hyponatremia: Na 130 on admission, unclear etiology but possibly 2/2 poor PO intake and renal loss. Unlikely SIADH as appears volume-down on exam.  - IVF as above - Daily BMP  Fifth Metatarsal Fracture: Patient with recent history of "twisting ankle" when ambulating from chair to walker. Daughter requested x-ray which ED provider obtained. X-ray of right foot showed transverse fracture through the distal aspect of the fifth metatarsal with mild medial displacement of metatarsal head.  -tylenol for pain  -PT consult  -discuss management options with family in AM: post op shoe if painful; if non-painful would manage conservatively   HFpEF  HTN: EF 2016 60-65%. Currently on metoprolol. Given patient dry on exam, will initiate fluids but plan to closely monitor fluid status.  - continue metoprolol 54m once daily given stable BP; can consider d/c if BP worsens - IVF as above  Hx A Fib, Heartblock s/p Pacemaker: Chronically anticoagulated on eliquis.  - continue eliquis 2.531mBID  Hypothyroidism: TSH wnl 04/2017; will recheck given patient's myalgias and fatigue.  - Continue home levothyroxine - f/u TSH  Chronic, Stable: Glaucoma: Continue home latanoprost, cyclosporine eye drops Gout: continue home allopurinol and colchicine GERD: substitute pantoprazole for  home esomeprazole Depression: Continue home trazodone, Holding home duloxetine due to current CrCl <30 in setting of presumed AKI.   FEN/GI: regular diet,  miralax/docusate prn, NS 100 cc/hr x24h  Prophylaxis: on home apixaban  Disposition: admit to [facility]; attending Dr. Erin Hearing; anticipate d/c to home with home health aide  History of Present Illness:  Jordan [name] is a 84 y.o. female presenting with change in urine color/odor, increased fatigue, and poor PO intake. History provided by two daughters at bedside. They report that their mother started to complain of body aches, weakness, and lower back pain yesterday afternoon. They note her urine was also darker yellow in color and had a foul odor that was not present previously. They called the after-hours line for the [facility] and, per instructions, collected a urine sample which was taken to the clinic. They were prescribed keflex which the patient took one dose of. Overnight, pt developed shaking chills (although no fever) and daughter recorded multiple blood pressures with systolics 16-94 (lowest 50/38). She also had worsened PO intake. They called the clinic again today and were instructed to come to the ED.   Of note, daughter reports patient has been more confused for the last two days. She is UTD on flu vaccine and has no sick contacts. Daughter reports mild wet-sounding cough two days ago that resolved. Otherwise, no nausea/vomiting, diarrhea, fevers/chills (see ROS for full list).  Daughter reports that she twisted her ankle a few days ago when getting up from a chair to the walker and had significant bruising and edema which has since resolved.   Review Of Systems: Per HPI with the following additions:   Review of Systems  Constitutional: Positive for chills and malaise/fatigue. Negative for diaphoresis and fever.  HENT: Negative for congestion and sore throat.   Respiratory: Positive for cough. Negative for sputum production, shortness of breath and wheezing.   Cardiovascular: Negative for chest pain and leg swelling.  Gastrointestinal: Negative for abdominal pain, constipation, diarrhea, nausea  and vomiting.  Musculoskeletal: Positive for back pain. Negative for falls.  Neurological: Positive for weakness.    Patient Active Problem List   Diagnosis Date Noted  . Encounter for long-term opiate analgesic use 04/07/2017  . Pain in right paraspinal region 02/13/2017  . Weight loss, unintentional 08/12/2016  . UTI (urinary tract infection) 05/27/2016  . Idiopathic gout 05/05/2016  . Collapsed vertebra due to osteoporosis ([place]) 10/06/2015  . Atherosclerosis of aorta ([place]) 10/05/2015  . Calcification of native coronary artery 10/05/2015  . Chronic bilateral thoracic back pain 10/05/2015  . History of Food impaction of esophagus   . Esophageal dysmotility   . Hyperlipidemia 02/27/2015  . GERD (gastroesophageal reflux disease) 07/25/2014  . Impaired mobility and ADLs 05/30/2014  . Chronic pain syndrome 05/09/2014  . Hearing loss of aging 05/09/2014  . Glaucoma 04/18/2014  . Macular degeneration, age related, nonexudative 04/18/2014  . Combined visual hearing impairment 11/12/2013  . History of acute gouty arthritis 09/21/2013  . 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 CKD (chronic kidney disease) stage 3, GFR 30-59 ml/min (HCC) 09/03/2013  . Chronic diastolic heart failure ([place]) 09/01/2013  . Frailty 08/29/2013  . Constipation 08/23/2013  . Alzheimer's dementia 07/30/2013  . Spinal stenosis of lumbar region 10/03/2012  . Gait instability 08/28/2012  . Xerostomia 01/24/2012  . Insomnia 06/14/2010  . PERIPHERAL NEUROPATHY 03/29/2010  . Hypothyroidism 06/29/2006  . Depressive disorder 06/29/2006  . HTN (hypertension) 06/29/2006  . CYSTOCELE/RECTOCELE/PROLAPSE,UNSPEC.  06/29/2006  . Urinary incontinence, mixed 06/29/2006  . OSTEOARTHRITIS, MULTI SITES 06/29/2006    Past Medical History: Past Medical History:  Diagnosis Date  . AKI (acute kidney injury) ([place])   . Alzheimer's dementia 07/30/2013   04/17/14 MoCA - Blind  (Spanish version); 9 out of 22.  Correct MoCA = 12 out of 30.    . Arthritis   . Atherosclerosis of aorta ([place]) 10/05/2015  . Back pain 10/05/2015  . Calcification of native coronary artery 10/05/2015   Chest CT 2011.   . Carpal tunnel syndrome 06/29/2006   Qualifier: Diagnosis of  By: Benna Dunks    . Cervicalgia   . Cervicalgia 05/18/2009   Qualifier: Diagnosis of  By: Walker Kehr MD, Patrick Jupiter    . CHF (congestive heart failure) ([place])   . Chronic bilateral thoracic back pain 10/05/2015  . Chronic constipation   . Chronic diastolic heart failure ([place]) 09/01/2013  . Chronic pain syndrome 05/09/2014  . CKD (chronic kidney disease) stage 3, GFR 30-59 ml/min (HCC) 09/03/2013  . Combined visual hearing impairment 11/12/2013  . Complete heart block ([place])   . Constipation   . Cryptogenic stroke ([place]) S/P  LOOP RECORDER IMPLANT   DX  01/2013  --  SYNCOPAL EPISODE --  EPISODIC  ATRIAL TACHY/ FIBULATION  AND PAUSES  . CYSTOCELE/RECTOCELE/PROLAPSE,UNSPEC. 06/29/2006   Qualifier: Diagnosis of  By: Benna Dunks    . Depressive disorder 06/29/2006       . Diverticulosis of colon   . DIVERTICULOSIS OF COLON 06/29/2006   Qualifier: Diagnosis of  By: Benna Dunks    . Dry eyes   . Dysphagia 09/15/2015  . Fall from bed 03/17/2016  . Fatigue   . Food impaction of esophagus   . Frequency of urination   . Gait instability   . Glaucoma 04/18/2014  . Glaucoma of both eyes   . Gout 09/21/2013  . Hand Heaviness 03/17/2016  . Headache   . Hearing loss of aging 05/09/2014  . Hearing loss of both ears    no hearing aids  . HERNIA, INCISIONAL VENTRAL W/O OBST/GNGR 10/25/2006   Has been seen by surgery in the past and told that this is not dangerous and should only be treated particularly bothersome    . History of acute gouty arthritis 09/21/2013  . History of colon polyps   . History of Food impaction of esophagus   . HTN (hypertension) 06/29/2006   Isolated Systolic Hypertension - Ambulatory BP  Monitoring result 4/28-29/2015    . Hyperlipidemia 02/27/2015  . Hyperplastic colon polyp 2006   colonoscope  . Hypertension   . Hypochloremia   . Hyponatremia   . Hypothyroidism   . Impaired mobility and ADLs 05/30/2014  . Influenza A 05/14/2016  . Insomnia   . INTERSTITIAL CYSTITIS 09/21/2009   Qualifier: Diagnosis of  By: Sarita Haver  MD, Coralyn Mark    . Loss of weight 04/05/2010   Qualifier: Diagnosis of  By: Walker Kehr MD, Patrick Jupiter    . Lumbar stenosis   . Macular degeneration, age related, nonexudative 04/18/2014  . MASS, LUNG 04/05/2010   Qualifier: Diagnosis of  By: Walker Kehr MD, Patrick Jupiter    . Mobitz type 2 second degree heart block 08/29/2013  . Moderate dementia   . OA (osteoarthritis)    RIGHT SHOULDER AC JOINT  . Orthostasis 04/28/2016  . OSTEOARTHRITIS, MULTI SITES 06/29/2006   Qualifier: Diagnosis of  By: Benna Dunks    . Osteoporosis   . OSTEOPOROSIS 03/20/2008   Qualifier: Diagnosis  of  By: Jerline Pain MD, Anderson Malta    . Pacemaker   . 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 Peripheral neuropathy   . PERIPHERAL NEUROPATHY 03/29/2010   Qualifier: Diagnosis of  By: Walker Kehr MD, Patrick Jupiter    . Persistent headaches 09/06/2013  . Polymyalgia rheumatica ([place]) 11/08/2013  . Presence of permanent cardiac pacemaker   . Rib pain on left side 10/05/2015  . Right knee pain 01/15/2014  . Right rotator cuff tear   . Rotator cuff tear arthropathy 09/11/2012   Likely related to rotator cuff tendinopathy seen on musculoskeletal ultrasound, and GH DJD.  Complete tear of right supra and infraspinatus on MRI 10/2012     . S/P shoulder replacement 03/20/2015  . Shortness of breath 07/21/2014  . Shortness of breath dyspnea   . Spinal stenosis of lumbar region 10/03/2012   Patient has significant low back pain related to spinal stenosis and DJD, DDD.  We had a long discussion  about treatment options which are quite limited. She is likely not a good surgical candidate for laminectomy. I am not sure about the usefulness of epidural steroid injection. We'll plan on restarting meloxicam after a long discussion of the pros and cons and risks of chronic kidney disease.  The patient and her family feel that the benefit of good pain control is the small risk.  Additionally she will followup with her primary care provider for creatinine monitoring.  Will obtain physical therapy for TENS unit trial.  Will refer to pain management for consideration of epidural steroid injection as that worked well in the past for cervical pain Followup here as needed   . SUI (stress urinary incontinence, female)   . Syncope and collapse 02/09/2013  . Urinary frequency 06/05/2015  . Urinary incontinence, mixed 06/29/2006   Qualifier: Diagnosis of  By: Benna Dunks    . UTI (urinary tract infection) 05/27/2016  . Vertigo 10/18/2010  . Weakness 08/29/2013    Past Surgical History: Past Surgical History:  Procedure Laterality Date  . CARPAL TUNNEL RELEASE Right   . ESOPHAGOGASTRODUODENOSCOPY N/A 09/15/2015   Procedure: ESOPHAGOGASTRODUODENOSCOPY (EGD);  Surgeon: Clarene Essex, MD;  Location: [facility] ENDOSCOPY;  Service: Endoscopy;  Laterality: N/A;  . EYE SURGERY     cataract, bilat.  Randolm Idol / REPLACE / REMOVE PACEMAKER    . LOOP RECORDER IMPLANT  02-08-13; 08-30-13   MDT LinQ implanted by Dr Lovena Le for syncope, explanted 08-30-13 after CHB identified  . LOOP RECORDER IMPLANT N/A 02/11/2013   Procedure: LOOP RECORDER IMPLANT;  Surgeon: Evans Lance, MD;  Location: [facility] CATH LAB;  Service: Cardiovascular;  Laterality: N/A;  . PACEMAKER INSERTION  08-30-2013   MDT ADDRL1 pacemaker implanted by Dr Rayann Heman for CHB  . PERMANENT PACEMAKER INSERTION N/A 08/30/2013   Procedure: PERMANENT PACEMAKER INSERTION;  Surgeon: Coralyn Mark, MD;  Location: [facility] CATH LAB;  Service: Cardiovascular;  Laterality: N/A;  . REVERSE  SHOULDER ARTHROPLASTY Right 03/20/2015   Procedure: RIGHT REVERSE SHOULDER ARTHROPLASTY;  Surgeon: Netta Cedars, MD;  Location: [facility];  Service: Orthopedics;  Laterality: Right;  . TOTAL ABDOMINAL HYSTERECTOMY W/ BILATERAL SALPINGOOPHORECTOMY  03-15-2005    Social History: Social History   Tobacco Use  . Smoking status: Never Smoker  . Smokeless tobacco: Never Used  Substance Use Topics  . Alcohol use: No  Alcohol/week: 0.0 oz  . Drug use: No   Additional social history: lives at home with son, does no IADLs and requires assistance with all ADLs. Is ambulatory only with a walker and with supervision.  Please also refer to relevant sections of EMR.  Family History: Family History  Problem Relation Age of Onset  . Kidney disease Mother   . Heart attack Neg Hx   . Stroke Neg Hx     Allergies and Medications: Allergies  Allergen Reactions  . Chlorthalidone Other (See Comments)    Hyponatremia  . Honey Bee Treatment [Bee Venom] Swelling and Rash  . Verapamil Other (See Comments)    Bradycardia - HR  35-45 on 80m TID  . Adhesive [Tape] Rash and Other (See Comments)    Skin irritation  . Amlodipine Other (See Comments)    Peripheral edema with 2.547mdaily dose   No current facility-administered medications on file prior to encounter.    Current Outpatient Medications on File Prior to Encounter  Medication Sig Dispense Refill  . acetaminophen (TYLENOL) 500 MG tablet Take 1,000 mg by mouth every 8 (eight) hours as needed for mild pain.    . Marland Kitchenllopurinol (ZYLOPRIM) 100 MG tablet take 1 tablet by mouth once daily 30 tablet 12  . apixaban (ELIQUIS) 2.5 MG TABS tablet Take 2.5 mg by mouth 2 (two) times daily.    . beta carotene w/minerals (OCUVITE) tablet Take 1 tablet by mouth daily. 90 tablet 3  . Calcium Carbonate-Vitamin D (CALTRATE 600+D) 600-400 MG-UNIT per tablet Take 1 tablet by mouth 2 (two) times daily.      . cephALEXin (KEFLEX) 500 MG capsule Take 1 capsule (500 mg  total) by mouth 2 (two) times daily for 7 days. 14 capsule 0  . colchicine 0.6 MG tablet take 1 tablet by mouth twice a day 60 tablet 3  . cycloSPORINE (RESTASIS) 0.05 % ophthalmic emulsion Place 1 drop into both eyes at bedtime.    . diclofenac sodium (VOLTAREN) 1 % GEL Apply 2 g topically 4 (four) times daily. 100 g 1  . docusate sodium (COLACE) 100 MG capsule Take 100 mg by mouth daily as needed for mild constipation.    . DULoxetine (CYMBALTA) 60 MG capsule take 1 capsule by mouth once daily 30 capsule PRN  . esomeprazole (NEXIUM) 40 MG capsule Take 1 capsule (40 mg total) by mouth daily. 30 capsule PRN  . fesoterodine (TOVIAZ) 4 MG TB24 tablet Take 1 tablet (4 mg total) by mouth daily. 30 tablet PRN  . Fish Oil-Cholecalciferol (FISH OIL + D3 PO) Take 1 capsule by mouth daily.    . Marland Kitchenabapentin (NEURONTIN) 100 MG capsule 2 capsules (200 mg total) 2 (two) times daily. 180 capsule 5  . glucosamine-chondroitin 500-400 MG tablet Take 1 tablet by mouth 2 (two) times daily.     . Marland Kitchenevothyroxine (SYNTHROID, LEVOTHROID) 100 MCG tablet Take 1 tablet (100 mcg total) daily by mouth. 90 tablet 1  . metoprolol succinate (TOPROL-XL) 25 MG 24 hr tablet Take 25 mg by mouth daily.  0  . polyethylene glycol (MIRALAX / GLYCOLAX) packet take 17 grams by mouth once daily 100 packet PRN  . TRAVATAN Z 0.004 % SOLN ophthalmic solution Place 1 drop into both eyes at bedtime.     . traZODone (DESYREL) 50 MG tablet take 1 tablet by mouth at bedtime 90 tablet 3  . oxyCODONE (ROXICODONE) 5 MG immediate release tablet Take 0.5 tablets (2.5 mg total) by mouth every 4 (  four) hours as needed for severe pain. (Patient not taking: Reported on 05/31/2017) 30 tablet 0    Objective: BP (!) 135/57   Pulse 79   Temp 98.2 F (36.8 C) (Oral)   Resp 15   Wt 130 lb (59 kg)   SpO2 99%   BMI 23.78 kg/m  Exam: General: alert, no acute distress, resting comfortably in bed, orientation not formally assessed but at least oriented to  person. Dry mucous membranes.  Eyes: anicteric, EOMI  ENTM:  TTP just posterior to L angle of mandible but no palpable masses, nodules, or overlying skin changes. TM's pearly grey with good cone of light and no erythema or effusion. Mildly dry MM.  Neck: supple, no palpable lymphadenopathy. Cardiovascular: RRR, no m/r/g. 2+ DP and radial pulses. Cap refill ~4sec Respiratory: faint LLL fine crackles, otherwirse CTA b/l with normal work of breathing on RA Gastrointestinal: soft, nontender, nondistended with normoactive bowel sounds. No suprapubic tenderness. Palpable large hernia just R of umbilicus, nontender and soft to palpation MSK: grip strength intact b/l, LE WWP without edema. Right foot non-TTP with full ROM. No CVA tenderness.  Derm: no appreciable rashes or lesions Neuro: no focal deficits Psych: pleasant, cooperative, follows commands and responds appropriately to questions   Labs and Imaging: CBC BMET  Recent Labs  Lab 05/31/17 1140  WBC 17.8*  HGB 10.3*  HCT 31.8*  PLT 104*   Recent Labs  Lab 05/31/17 1140  NA 130*  K 4.7  CL 96*  CO2 25  BUN 38*  CREATININE 2.41*  GLUCOSE 99  CALCIUM 7.9Marcell Anger, Yves Dill, Medical Student 05/31/2017, 3:51 PM   Upper Level Addendum:  I have seen and evaluated this patient along with Dimple Casey, MS-4 and reviewed the above note, making necessary revisions in green.   Phill Myron, D.O. 05/31/2017, 5:52 PM PGY-3, [place]

## 2017-05-31 NOTE — ED Notes (Signed)
Pt placed on hospital bed, family bedside

## 2017-05-31 NOTE — ED Provider Notes (Signed)
[facility] EMERGENCY DEPARTMENT Provider Note   CSN: [id number] Arrival date & time: 05/31/17  1036     History   Chief Complaint Chief Complaint  Patient presents with  . Urinary Tract Infection    HPI Jordan [name] is a 84 y.o. female.  HPI Patient is a 84 year old female who has had 24 hours of generalized malaise and myalgias and generalized weakness as well as foul-smelling urine.  Urine sample performed through the family practice clinic yesterday demonstrated urinary tract infection.  She was called in a prescription for Keflex.  Through the night she began having Reiger's and chills without documented fever.  She was found to be hypotensive and weak per family with a blood pressure of 84/38 and 98/45.  Family reports decreased oral intake today.  She was encouraged to come to the emergency department by the family practice center.  Denies abdominal pain.  No significant back pain.  Denies nausea and vomiting.  No fevers or chills at this time.  Blood pressure on arrival to the emergency department is 109/47.   Past Medical History:  Diagnosis Date  . AKI (acute kidney injury) ([place])   . Alzheimer's dementia 07/30/2013   04/17/14 MoCA - Blind (Spanish version); 9 out of 22.  Correct MoCA = 12 out of 30.    . Arthritis   . Atherosclerosis of aorta ([place]) 10/05/2015  . Back pain 10/05/2015  . Calcification of native coronary artery 10/05/2015   Chest CT 2011.   . Carpal tunnel syndrome 06/29/2006   Qualifier: Diagnosis of  By: Benna Dunks    . Cervicalgia   . Cervicalgia 05/18/2009   Qualifier: Diagnosis of  By: Walker Kehr MD, Patrick Jupiter    . CHF (congestive heart failure) ([place])   . Chronic bilateral thoracic back pain 10/05/2015  . Chronic constipation   . Chronic diastolic heart failure ([place]) 09/01/2013  . Chronic pain syndrome 05/09/2014  . CKD (chronic kidney disease) stage 3, GFR 30-59 ml/min (HCC) 09/03/2013  . Combined visual hearing impairment 11/12/2013  . Complete heart  block ([place])   . Constipation   . Cryptogenic stroke ([place]) S/P  LOOP RECORDER IMPLANT   DX  01/2013  --  SYNCOPAL EPISODE --  EPISODIC  ATRIAL TACHY/ FIBULATION  AND PAUSES  . CYSTOCELE/RECTOCELE/PROLAPSE,UNSPEC. 06/29/2006   Qualifier: Diagnosis of  By: Benna Dunks    . Depressive disorder 06/29/2006       . Diverticulosis of colon   . DIVERTICULOSIS OF COLON 06/29/2006   Qualifier: Diagnosis of  By: Benna Dunks    . Dry eyes   . Dysphagia 09/15/2015  . Fall from bed 03/17/2016  . Fatigue   . Food impaction of esophagus   . Frequency of urination   . Gait instability   . Glaucoma 04/18/2014  . Glaucoma of both eyes   . Gout 09/21/2013  . Hand Heaviness 03/17/2016  . Headache   . Hearing loss of aging 05/09/2014  . Hearing loss of both ears    no hearing aids  . HERNIA, INCISIONAL VENTRAL W/O OBST/GNGR 10/25/2006   Has been seen by surgery in the past and told that this is not dangerous and should only be treated particularly bothersome    . History of acute gouty arthritis 09/21/2013  . History of colon polyps   . History of Food impaction of esophagus   . HTN (hypertension) 06/29/2006   Isolated Systolic Hypertension - Ambulatory BP  Monitoring result 4/28-29/2015   .  Hyperlipidemia 02/27/2015  . Hyperplastic colon polyp 2006   colonoscope  . Hypertension   . Hypochloremia   . Hyponatremia   . Hypothyroidism   . Impaired mobility and ADLs 05/30/2014  . Influenza A 05/14/2016  . Insomnia   . INTERSTITIAL CYSTITIS 09/21/2009   Qualifier: Diagnosis of  By: Sarita Haver  MD, Coralyn Mark    . Loss of weight 04/05/2010   Qualifier: Diagnosis of  By: Walker Kehr MD, Patrick Jupiter    . Lumbar stenosis   . Macular degeneration, age related, nonexudative 04/18/2014  . MASS, LUNG 04/05/2010   Qualifier: Diagnosis of  By: Walker Kehr MD, Patrick Jupiter    . Mobitz type 2 second degree heart block 08/29/2013  . Moderate dementia   . OA (osteoarthritis)    RIGHT SHOULDER AC JOINT  . Orthostasis 04/28/2016  . OSTEOARTHRITIS,  MULTI SITES 06/29/2006   Qualifier: Diagnosis of  By: Benna Dunks    . Osteoporosis   . OSTEOPOROSIS 03/20/2008   Qualifier: Diagnosis of  By: Jerline Pain MD, Anderson Malta    . Pacemaker   . 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 Peripheral neuropathy   . PERIPHERAL NEUROPATHY 03/29/2010   Qualifier: Diagnosis of  By: Walker Kehr MD, Patrick Jupiter    . Persistent headaches 09/06/2013  . Polymyalgia rheumatica ([place]) 11/08/2013  . Presence of permanent cardiac pacemaker   . Rib pain on left side 10/05/2015  . Right knee pain 01/15/2014  . Right rotator cuff tear   . Rotator cuff tear arthropathy 09/11/2012   Likely related to rotator cuff tendinopathy seen on musculoskeletal ultrasound, and GH DJD.  Complete tear of right supra and infraspinatus on MRI 10/2012     . S/P shoulder replacement 03/20/2015  . Shortness of breath 07/21/2014  . Shortness of breath dyspnea   . Spinal stenosis of lumbar region 10/03/2012   Patient has significant low back pain related to spinal stenosis and DJD, DDD.  We had a long discussion about treatment options which are quite limited. She is likely not a good surgical candidate for laminectomy. I am not sure about the usefulness of epidural steroid injection. We'll plan on restarting meloxicam after a long discussion of the pros and cons and risks of chronic kidney disease.  The patient and her family feel that the benefit of good pain control is the small risk.  Additionally she will followup with her primary care provider for creatinine monitoring.  Will obtain physical therapy for TENS unit trial.  Will refer to pain management for consideration of epidural steroid injection as that worked well in the past for cervical pain Followup here as needed   . SUI (stress urinary incontinence, female)   . Syncope and  collapse 02/09/2013  . Urinary frequency 06/05/2015  . Urinary incontinence, mixed 06/29/2006   Qualifier: Diagnosis of  By: Benna Dunks    . UTI (urinary tract infection) 05/27/2016  . Vertigo 10/18/2010  . Weakness 08/29/2013    Patient Active Problem List   Diagnosis Date Noted  . Encounter for long-term opiate analgesic use 04/07/2017  . Pain in right paraspinal region 02/13/2017  . Weight loss, unintentional 08/12/2016  . UTI (urinary tract infection) 05/27/2016  . Idiopathic gout 05/05/2016  . Collapsed vertebra due to osteoporosis ([place]) 10/06/2015  . Atherosclerosis of aorta ([place]) 10/05/2015  . Calcification of native coronary artery  10/05/2015  . Chronic bilateral thoracic back pain 10/05/2015  . History of Food impaction of esophagus   . Esophageal dysmotility   . Hyperlipidemia 02/27/2015  . GERD (gastroesophageal reflux disease) 07/25/2014  . Impaired mobility and ADLs 05/30/2014  . Chronic pain syndrome 05/09/2014  . Hearing loss of aging 05/09/2014  . Glaucoma 04/18/2014  . Macular degeneration, age related, nonexudative 04/18/2014  . Combined visual hearing impairment 11/12/2013  . History of acute gouty arthritis 09/21/2013  . 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 CKD (chronic kidney disease) stage 3, GFR 30-59 ml/min (HCC) 09/03/2013  . Chronic diastolic heart failure ([place]) 09/01/2013  . Frailty 08/29/2013  . Constipation 08/23/2013  . Alzheimer's dementia 07/30/2013  . Spinal stenosis of lumbar region 10/03/2012  . Gait instability 08/28/2012  . Xerostomia 01/24/2012  . Insomnia 06/14/2010  . PERIPHERAL NEUROPATHY 03/29/2010  . Hypothyroidism 06/29/2006  . Depressive disorder 06/29/2006  . HTN (hypertension) 06/29/2006  . CYSTOCELE/RECTOCELE/PROLAPSE,UNSPEC. 06/29/2006  . Urinary incontinence, mixed 06/29/2006  . OSTEOARTHRITIS, MULTI SITES 06/29/2006    Past Surgical History:  Procedure Laterality  Date  . CARPAL TUNNEL RELEASE Right   . ESOPHAGOGASTRODUODENOSCOPY N/A 09/15/2015   Procedure: ESOPHAGOGASTRODUODENOSCOPY (EGD);  Surgeon: Clarene Essex, MD;  Location[facility] ENDOSCOPY;  Service: Endoscopy;  Laterality: N/A;  . EYE SURGERY     cataract, bilat.  Randolm Idol / REPLACE / REMOVE PACEMAKER    . LOOP RECORDER IMPLANT  02-08-13; 08-30-13   MDT LinQ implanted by Dr Lovena Le for syncope, explanted 08-30-13 after CHB identified  . LOOP RECORDER IMPLANT N/A 02/11/2013   Procedure: LOOP RECORDER IMPLANT;  Surgeon: Evans Lance, MD;  Location: [facility] CATH LAB;  Service: Cardiovascular;  Laterality: N/A;  . PACEMAKER INSERTION  08-30-2013   MDT [id number] pacemaker implanted by Dr Rayann Heman for CHB  . PERMANENT PACEMAKER INSERTION N/A 08/30/2013   Procedure: PERMANENT PACEMAKER INSERTION;  Surgeon: Coralyn Mark, MD;  Location: [facility] CATH LAB;  Service: Cardiovascular;  Laterality: N/A;  . REVERSE SHOULDER ARTHROPLASTY Right 03/20/2015   Procedure: RIGHT REVERSE SHOULDER ARTHROPLASTY;  Surgeon: Netta Cedars, MD;  Location: [facility];  Service: Orthopedics;  Laterality: Right;  . TOTAL ABDOMINAL HYSTERECTOMY W/ BILATERAL SALPINGOOPHORECTOMY  03-15-2005    OB History    Gravida Para Term Preterm AB Living   0 0 0 0 0     SAB TAB Ectopic Multiple Live Births   0 0 0           Home Medications    Prior to Admission medications   Medication Sig Start Date End Date Taking? Authorizing Provider  acetaminophen (TYLENOL) 500 MG tablet Take 1,000 mg by mouth every 8 (eight) hours as needed for mild pain.   Yes [provider]  allopurinol (ZYLOPRIM) 100 MG tablet take 1 tablet by mouth once daily 01/30/17  Yes McDiarmid, Blane Ohara, MD  apixaban (ELIQUIS) 2.5 MG TABS tablet Take 2.5 mg by mouth 2 (two) times daily.   Yes Adrian Prows, MD  beta carotene w/minerals (OCUVITE) tablet Take 1 tablet by mouth daily. 04/17/14  Yes Cook, Jayce G, DO  Calcium Carbonate-Vitamin D (CALTRATE 600+D) 600-400 MG-UNIT per tablet Take 1  tablet by mouth 2 (two) times daily.     Yes [provider]  cephALEXin (KEFLEX) 500 MG capsule Take 1 capsule (500 mg total) by mouth 2 (two) times daily for 7 days. 05/30/17 06/06/17 Yes Carlyle Dolly, MD  colchicine 0.6 MG tablet  take 1 tablet by mouth twice a day 01/16/17  Yes McDiarmid, Blane Ohara, MD  cycloSPORINE (RESTASIS) 0.05 % ophthalmic emulsion Place 1 drop into both eyes at bedtime.   Yes [provider]  diclofenac sodium (VOLTAREN) 1 % GEL Apply 2 g topically 4 (four) times daily. 02/13/17  Yes De Lamere Bing, DO  docusate sodium (COLACE) 100 MG capsule Take 100 mg by mouth daily as needed for mild constipation.   Yes [provider]  DULoxetine (CYMBALTA) 60 MG capsule take 1 capsule by mouth once daily 12/01/16  Yes McDiarmid, Blane Ohara, MD  esomeprazole (NEXIUM) 40 MG capsule Take 1 capsule (40 mg total) by mouth daily. 06/02/16  Yes McDiarmid, Blane Ohara, MD  fesoterodine (TOVIAZ) 4 MG TB24 tablet Take 1 tablet (4 mg total) by mouth daily. 04/12/17  Yes McDiarmid, Blane Ohara, MD  Fish Oil-Cholecalciferol (FISH OIL + D3 PO) Take 1 capsule by mouth daily.   Yes [provider]  gabapentin (NEURONTIN) 100 MG capsule 2 capsules (200 mg total) 2 (two) times daily. 04/06/17  Yes McDiarmid, Blane Ohara, MD  glucosamine-chondroitin 500-400 MG tablet Take 1 tablet by mouth 2 (two) times daily.    Yes [provider]  levothyroxine (SYNTHROID, LEVOTHROID) 100 MCG tablet Take 1 tablet (100 mcg total) daily by mouth. 03/13/17  Yes McDiarmid, Blane Ohara, MD  metoprolol succinate (TOPROL-XL) 25 MG 24 hr tablet Take 25 mg by mouth daily. 09/21/15  Yes [provider]  polyethylene glycol (MIRALAX / GLYCOLAX) packet take 17 grams by mouth once daily 12/19/16  Yes McDiarmid, Blane Ohara, MD  TRAVATAN Z 0.004 % SOLN ophthalmic solution Place 1 drop into both eyes at bedtime.  02/20/13  Yes [provider]  traZODone (DESYREL) 50 MG tablet take 1 tablet by mouth at  bedtime 06/22/16  Yes McDiarmid, Blane Ohara, MD  oxyCODONE (ROXICODONE) 5 MG immediate release tablet Take 0.5 tablets (2.5 mg total) by mouth every 4 (four) hours as needed for severe pain. Patient not taking: Reported on 05/31/2017 04/06/17   McDiarmid, Blane Ohara, MD    Family History Family History  Problem Relation Age of Onset  . Kidney disease Mother   . Heart attack Neg Hx   . Stroke Neg Hx     Social History Social History   Tobacco Use  . Smoking status: Never Smoker  . Smokeless tobacco: Never Used  Substance Use Topics  . Alcohol use: No    Alcohol/week: 0.0 oz  . Drug use: No     Allergies   Chlorthalidone; Honey bee treatment [bee venom]; Verapamil; Adhesive [tape]; and Amlodipine   Review of Systems Review of Systems  All other systems reviewed and are negative.    Physical Exam Updated Vital Signs BP 114/60   Pulse 87   Temp 98.2 F (36.8 C) (Oral)   Resp (!) 22   Wt 59 kg (130 lb)   SpO2 100%   BMI 23.78 kg/m   Physical Exam  Constitutional: She is oriented to person, place, and time. She appears well-developed and well-nourished. No distress.  HENT:  Head: Normocephalic and atraumatic.  Eyes: EOM are normal.  Neck: Normal range of motion.  Cardiovascular: Normal rate, regular rhythm and normal heart sounds.  Pulmonary/Chest: Effort normal and breath sounds normal.  Abdominal: Soft. She exhibits no distension. There is no tenderness.  Musculoskeletal: Normal range of motion.  Neurological: She is alert and oriented to person, place, and time.  Skin: Skin is warm  and dry.  Psychiatric: She has a normal mood and affect. Judgment normal.  Nursing note and vitals reviewed.    ED Treatments / Results  Labs (all labs ordered are listed, but only abnormal results are displayed) Labs Reviewed  CBC WITH DIFFERENTIAL/PLATELET - Abnormal; Notable for the following components:      Result Value   WBC 17.8 (*)    RBC 3.23 (*)    Hemoglobin 10.3 (*)      HCT 31.8 (*)    Platelets 104 (*)    Neutro Abs 14.8 (*)    All other components within normal limits  COMPREHENSIVE METABOLIC PANEL - Abnormal; Notable for the following components:   Sodium 130 (*)    Chloride 96 (*)    BUN 38 (*)    Creatinine, Ser 2.41 (*)    Calcium 7.9 (*)    Total Protein 5.3 (*)    Albumin 2.6 (*)    GFR calc non Af Amer 16 (*)    GFR calc Af Amer 19 (*)    All other components within normal limits  URINE CULTURE  CULTURE, BLOOD (ROUTINE X 2)  CULTURE, BLOOD (ROUTINE X 2)  URINALYSIS, ROUTINE W REFLEX MICROSCOPIC  I-STAT CG4 LACTIC ACID, ED   BUN  Date Value Ref Range Status  05/31/2017 38 (H) 6 - 20 mg/dL Final  02/13/2017 18 10 - 36 mg/dL Final  05/26/2016 19 7 - 25 mg/dL Final  05/16/2016 23 (H) 6 - 20 mg/dL Final  05/15/2016 22 (H) 6 - 20 mg/dL Final   Creat  Date Value Ref Range Status  05/26/2016 1.26 (H) 0.60 - 0.88 mg/dL Final    Comment:      For patients > or = 82 years of age: The upper reference limit for Creatinine is approximately 13% higher for people identified as African-American.     02/04/2016 1.08 (H) 0.60 - 0.88 mg/dL Final    Comment:      For patients > or = 82 years of age: The upper reference limit for Creatinine is approximately 13% higher for people identified as African-American.     09/21/2015 1.17 (H) 0.60 - 0.88 mg/dL Final  06/04/2015 1.01 (H) 0.60 - 0.88 mg/dL Final   Creatinine, Ser  Date Value Ref Range Status  05/31/2017 2.41 (H) 0.44 - 1.00 mg/dL Final  02/13/2017 1.15 (H) 0.57 - 1.00 mg/dL Final  05/16/2016 1.61 (H) 0.44 - 1.00 mg/dL Final  05/15/2016 1.36 (H) 0.44 - 1.00 mg/dL Final      EKG  EKG Interpretation None       Radiology No results found.  Procedures Procedures (including critical care time)  Medications Ordered in ED Medications  sodium chloride 0.9 % bolus 1,000 mL (0 mLs Intravenous Stopped 05/31/17 1312)  cefTRIAXone (ROCEPHIN) 1 g in dextrose 5 % 50 mL IVPB (0  g Intravenous Stopped 05/31/17 1311)     Initial Impression / Assessment and Plan / ED Course  I have reviewed the triage vital signs and the nursing notes.  Pertinent labs & imaging results that were available during my care of the patient were reviewed by me and considered in my medical decision making (see chart for details).     Patient with urinary tract infection likely early sepsis.  Lactate reassuring.  Blood pressure improving with IV fluids.  No hypotension documented in the emergency department.  Blood cultures obtained.  IV Rocephin now.  Urinalysis and urine culture ordered.  I am able to  view the urine from the family practice office yesterday and it definitely shows signs of infection.  Patient will be admitted the hospital for ongoing workup.  Also complains of some right foot pain with injury 8 and bruising and swelling last week.  Family requesting x-ray.  Patient will undergo chest x-ray in addition to right foot x-ray.     Final Clinical Impressions(s) / ED Diagnoses   Final diagnoses:  Acute cystitis with hematuria    ED Discharge Orders    None       Jola Schmidt, MD 05/31/17 1357

## 2017-05-31 NOTE — ED Notes (Signed)
Urine sample spilled on floor; able to save enough for urinalysis but not enough sample for urine culture to be collected. Hospitalist bedside and stated the culture was done at primary care yesterday and today's culture will not be necessary.

## 2017-05-31 NOTE — Telephone Encounter (Signed)
Spoke with daughter and informed her that patient needs to go the ED asap.  I informed her that with those vitals here we would be sending her over there anyway.  Daughter voiced understanding and will do that as well as contact her cardiologist to let him know. Tashika Goodin,CMA

## 2017-06-01 ENCOUNTER — Ambulatory Visit: Payer: Self-pay | Admitting: Family Medicine

## 2017-06-01 ENCOUNTER — Encounter (HOSPITAL_COMMUNITY): Payer: Self-pay | Admitting: General Practice

## 2017-06-01 DIAGNOSIS — M1000 Idiopathic gout, unspecified site: Secondary | ICD-10-CM | POA: Diagnosis present

## 2017-06-01 DIAGNOSIS — X501XXA Overexertion from prolonged static or awkward postures, initial encounter: Secondary | ICD-10-CM | POA: Diagnosis not present

## 2017-06-01 DIAGNOSIS — Z841 Family history of disorders of kidney and ureter: Secondary | ICD-10-CM | POA: Diagnosis not present

## 2017-06-01 DIAGNOSIS — N179 Acute kidney failure, unspecified: Secondary | ICD-10-CM | POA: Diagnosis present

## 2017-06-01 DIAGNOSIS — Z96611 Presence of right artificial shoulder joint: Secondary | ICD-10-CM | POA: Diagnosis present

## 2017-06-01 DIAGNOSIS — F0280 Dementia in other diseases classified elsewhere without behavioral disturbance: Secondary | ICD-10-CM | POA: Diagnosis present

## 2017-06-01 DIAGNOSIS — Z90710 Acquired absence of both cervix and uterus: Secondary | ICD-10-CM | POA: Diagnosis not present

## 2017-06-01 DIAGNOSIS — Z90722 Acquired absence of ovaries, bilateral: Secondary | ICD-10-CM | POA: Diagnosis not present

## 2017-06-01 DIAGNOSIS — I5032 Chronic diastolic (congestive) heart failure: Secondary | ICD-10-CM | POA: Diagnosis present

## 2017-06-01 DIAGNOSIS — N3001 Acute cystitis with hematuria: Secondary | ICD-10-CM

## 2017-06-01 DIAGNOSIS — H9193 Unspecified hearing loss, bilateral: Secondary | ICD-10-CM | POA: Diagnosis present

## 2017-06-01 DIAGNOSIS — Z7901 Long term (current) use of anticoagulants: Secondary | ICD-10-CM | POA: Diagnosis not present

## 2017-06-01 DIAGNOSIS — E039 Hypothyroidism, unspecified: Secondary | ICD-10-CM | POA: Diagnosis present

## 2017-06-01 DIAGNOSIS — I4891 Unspecified atrial fibrillation: Secondary | ICD-10-CM | POA: Diagnosis present

## 2017-06-01 DIAGNOSIS — Z950 Presence of cardiac pacemaker: Secondary | ICD-10-CM | POA: Diagnosis not present

## 2017-06-01 DIAGNOSIS — I130 Hypertensive heart and chronic kidney disease with heart failure and stage 1 through stage 4 chronic kidney disease, or unspecified chronic kidney disease: Secondary | ICD-10-CM | POA: Diagnosis present

## 2017-06-01 DIAGNOSIS — A4151 Sepsis due to Escherichia coli [E. coli]: Secondary | ICD-10-CM | POA: Diagnosis present

## 2017-06-01 DIAGNOSIS — G894 Chronic pain syndrome: Secondary | ICD-10-CM | POA: Diagnosis present

## 2017-06-01 DIAGNOSIS — Z8673 Personal history of transient ischemic attack (TIA), and cerebral infarction without residual deficits: Secondary | ICD-10-CM | POA: Diagnosis not present

## 2017-06-01 DIAGNOSIS — H409 Unspecified glaucoma: Secondary | ICD-10-CM | POA: Diagnosis present

## 2017-06-01 DIAGNOSIS — S99919A Unspecified injury of unspecified ankle, initial encounter: Secondary | ICD-10-CM | POA: Diagnosis present

## 2017-06-01 DIAGNOSIS — N183 Chronic kidney disease, stage 3 (moderate): Secondary | ICD-10-CM | POA: Diagnosis present

## 2017-06-01 DIAGNOSIS — G309 Alzheimer's disease, unspecified: Secondary | ICD-10-CM | POA: Diagnosis present

## 2017-06-01 DIAGNOSIS — E871 Hypo-osmolality and hyponatremia: Secondary | ICD-10-CM | POA: Diagnosis present

## 2017-06-01 DIAGNOSIS — S92353A Displaced fracture of fifth metatarsal bone, unspecified foot, initial encounter for closed fracture: Secondary | ICD-10-CM | POA: Diagnosis present

## 2017-06-01 LAB — BLOOD CULTURE ID PANEL (REFLEXED)
ACINETOBACTER BAUMANNII: NOT DETECTED
CANDIDA ALBICANS: NOT DETECTED
CANDIDA GLABRATA: NOT DETECTED
CANDIDA TROPICALIS: NOT DETECTED
Candida krusei: NOT DETECTED
Candida parapsilosis: NOT DETECTED
Carbapenem resistance: NOT DETECTED
ENTEROBACTER CLOACAE COMPLEX: NOT DETECTED
ESCHERICHIA COLI: DETECTED — AB
Enterobacteriaceae species: DETECTED — AB
Enterococcus species: NOT DETECTED
Haemophilus influenzae: NOT DETECTED
Klebsiella oxytoca: NOT DETECTED
Klebsiella pneumoniae: NOT DETECTED
LISTERIA MONOCYTOGENES: NOT DETECTED
NEISSERIA MENINGITIDIS: NOT DETECTED
PROTEUS SPECIES: NOT DETECTED
PSEUDOMONAS AERUGINOSA: NOT DETECTED
STREPTOCOCCUS AGALACTIAE: NOT DETECTED
STREPTOCOCCUS PNEUMONIAE: NOT DETECTED
STREPTOCOCCUS PYOGENES: NOT DETECTED
Serratia marcescens: NOT DETECTED
Staphylococcus aureus (BCID): NOT DETECTED
Staphylococcus species: NOT DETECTED
Streptococcus species: NOT DETECTED

## 2017-06-01 LAB — CBC
HCT: 30.8 % — ABNORMAL LOW (ref 36.0–46.0)
HEMOGLOBIN: 9.9 g/dL — AB (ref 12.0–15.0)
MCH: 32 pg (ref 26.0–34.0)
MCHC: 32.1 g/dL (ref 30.0–36.0)
MCV: 99.7 fL (ref 78.0–100.0)
Platelets: 81 10*3/uL — ABNORMAL LOW (ref 150–400)
RBC: 3.09 MIL/uL — AB (ref 3.87–5.11)
RDW: 14.4 % (ref 11.5–15.5)
WBC: 7.6 10*3/uL (ref 4.0–10.5)

## 2017-06-01 LAB — BASIC METABOLIC PANEL
Anion gap: 7 (ref 5–15)
BUN: 31 mg/dL — AB (ref 6–20)
CHLORIDE: 110 mmol/L (ref 101–111)
CO2: 21 mmol/L — ABNORMAL LOW (ref 22–32)
Calcium: 6.6 mg/dL — ABNORMAL LOW (ref 8.9–10.3)
Creatinine, Ser: 1.63 mg/dL — ABNORMAL HIGH (ref 0.44–1.00)
GFR calc Af Amer: 30 mL/min — ABNORMAL LOW (ref >60)
GFR calc non Af Amer: 26 mL/min — ABNORMAL LOW (ref >60)
Glucose, Bld: 83 mg/dL (ref 65–99)
POTASSIUM: 3.4 mmol/L — AB (ref 3.5–5.1)
SODIUM: 138 mmol/L (ref 135–145)

## 2017-06-01 LAB — TSH: TSH: 2.481 u[IU]/mL (ref 0.350–4.500)

## 2017-06-01 MED ORDER — POTASSIUM CHLORIDE 20 MEQ PO PACK
20.0000 meq | PACK | Freq: Once | ORAL | Status: AC
  Administered 2017-06-01: 20 meq via ORAL
  Filled 2017-06-01: qty 1

## 2017-06-01 MED ORDER — SODIUM CHLORIDE 0.9 % IV BOLUS (SEPSIS): 500.0000 mL | Freq: Once | INTRAVENOUS | Status: DC

## 2017-06-01 MED ORDER — SODIUM CHLORIDE 0.9 % IV SOLN: INTRAVENOUS | Status: DC

## 2017-06-01 MED ORDER — DEXTROSE 5 % IV SOLN
2.0000 g | INTRAVENOUS | Status: DC
  Administered 2017-06-01 – 2017-06-02 (×2): 2 g via INTRAVENOUS
  Filled 2017-06-01 (×3): qty 2

## 2017-06-01 MED ORDER — ACETAMINOPHEN 500 MG PO TABS
1000.0000 mg | ORAL_TABLET | Freq: Three times a day (TID) | ORAL | Status: DC
  Administered 2017-06-01 – 2017-06-02 (×4): 1000 mg via ORAL
  Filled 2017-06-01 (×4): qty 2

## 2017-06-01 NOTE — Progress Notes (Signed)
Family Medicine Teaching Service Daily Progress Note Intern Pager: [phone]  [name] Medical record number: [id number] Date of birth: [birth year] Age: 84 y.o. Gender: female  Primary Care Provider: McDiarmid, Blane Ohara, MD Consultants: none Code Status: Full, pending discussion with family  Pt Overview and Major Events to Date:  1/30: Admitted, started CTX and IVF  Assessment and Plan: Jordan [name] is a 84 y.o. female presenting with urinary changes, malaise, and low BP recordings at home . PMH is significant for HTN, hypothyroidism, Alzheimer's dementia, HFpEF (EF 2016 60-65%), CKD III, heart block s/p pacemaker placement in 2015, and osteoarthritis.   E. Coli Bacteremia, Sepsis: Patient presents with AMS, malaise, poor PO intake, and reportedly low BPs at home with UA concerning for infection. Overnight, Bcx + x 2 for gram negative rods (ID panel with E. Coli and enterobacteriaceae species). qSOFA 2 with AMS, tachypnea and SOFA 3 with Cr and plts 104. Patient's leukocytosis has resolved and AMS has improved with antibiotics. In the setting of largely normalized vital signs, this is reassuring. UOP ~0.7 ml/kg/hr since transfer to floor. Will continue to treat with antibiotics, IVF, and symptomatic management.  - Remain on MedSurg, vital signs per unit  - Increase CTX from 1g to 2g q24H (1g on 1/30, started 2g 1/31 - ), s/p one dose cephalexin 1/29 - Will transition to PO abx pending sensitivities; f/u urine culture from clinic - 574m bolus today given dry MM, borderline UOP, and no evidence of worsening HF - continue NS @ 100cc/hr x 24h (s/p 1 L NS in ED) and encouraged PO intake; titrate for UOP > 0.5 ml/kg/hr - acetaminophen 1g Q8H prn pain - continue home fesoterodine for urinary incontinence  CKD Stage III  AKI: Cr improved to 1.63 from 2.41 (baseline ~1.1-1.3), eGFR now 26. Likely prerenal given poor PO intake, possibly with some element of worsening baseline CKD.  -  Daily BMP, CTM - IVF as above - Renally dose meds as needed and avoid nephrotoxic agents - hold Cymbalta in setting of AKI; plan to resume on d/c  Hypovolemic Hyponatremia: resolved. Na 130 on admission.  - IVF as above - Daily BMP  Fifth Metatarsal Fracture: Patient "twisted ankle" when ambulating from chair to walker ~ 2wks ago. Xray on admission with distal 5th metatarsal fracture. Fx has not limited patient's mobility and she is able to ambulated without pain.  - tylenol for pain  - PT consult  - weight bearing as tolerated; can consider hard-soled boot if patient has pain on ambulation but given risk for falls, no issues thus far, and potential to reduce mobility with shoe, elect not to place at this time  HFpEF  HTN: EF 2016 60-65%. Currently on metoprolol. Some LL crackles on exam but largely unchanged from admission. CXR without pulmonary edema.  Given patient dry on exam, will initiate fluids but plan to closely monitor fluid status.  - continue metoprolol 232monce daily given stable BP - IVF as above  Hx A Fib, Heartblock s/p Pacemaker: Chronically anticoagulated on eliquis.  - continue eliquis 2.72m23mID  Hypothyroidism: TSH wnl 04/2017; will recheck given patient's myalgias and fatigue.  - Continue home levothyroxine - f/u TSH  Chronic, Stable: Glaucoma: Continue home latanoprost, cyclosporine eye drops Gout: continue home allopurinol and colchicine GERD: substitute pantoprazole for home esomeprazole Depression: Continue home trazodone, Holding home duloxetine due to current CrCl <30 in setting of presumed AKI.   FEN/GI: regular diet, miralax/docusate prn, NS 100  cc/hr x24h  Prophylaxis: on home apixaban  Disposition: admit to FPTS; attending Dr. Erin Hearing; anticipate d/c to home with home health aide   Subjective:  Son at bedside with patient, declined translator (deferred to son who is English-speaking).  She reports that she continues to have body aches  "all over" and chills overnight but otherwise slept well. Has been trying to drink water but feels like her mouth and lips are dry.   Objective: Temp:  [97.8 F (36.6 C)-99.1 F (37.3 C)] 98.3 F (36.8 C) (01/31 0537) Pulse Rate:  [65-94] 80 (01/31 0537) Resp:  [15-25] 16 (01/31 0537) BP: (101-161)/(42-99) 161/58 (01/31 0537) SpO2:  [86 %-100 %] 98 % (01/31 0537) Weight:  [130 lb (59 kg)] 130 lb (59 kg) (01/30 1043) Physical Exam: General: elderly female, NAD, resting comfortably in bed. Oriented to person and place.  Cardiovascular: RRR, no m/r/g. No appreciable JVD.  Respiratory: Bibasilar LL crackles, unchanged from previous. Normal WOB on RA.  Abdomen: soft, nontender, normoactive bowel sounds Extremities: WWP, Cap refill ~3sec, no peripheral edema. R foot with tenderness over heel but no point tenderness over 5th metatarsal.   Laboratory: Recent Labs  Lab 05/31/17 1140 06/01/17 0504  WBC 17.8* 7.6  HGB 10.3* 9.9*  HCT 31.8* 30.8*  PLT 104* 81*   Recent Labs  Lab 05/31/17 1140 06/01/17 0504  NA 130* 138  K 4.7 3.4*  CL 96* 110  CO2 25 21*  BUN 38* 31*  CREATININE 2.41* 1.63*  CALCIUM 7.9* 6.6*  PROT 5.3*  --   BILITOT 0.4  --   ALKPHOS 98  --   ALT 25  --   AST 37  --   GLUCOSE 99 83    Imaging/Diagnostic Tests: none  Dimple Casey, Medical Student 06/01/2017, 7:20 AM [place] Intern pager: [phone], text pages welcome   RESIDENT ADDENDUM  I have separately seen and examined the patient. I have discussed the findings and exam with the medical student and agree with the above note, which I have edited appropriately. I helped develop the management plan that is described in the student's note, and I agree with the content.  Additionally I have outlined my exam and assessment/plan below:   84 year old female with PMH of HTN, HFpEF, hypothyroidism, HTN, CKD, and heart block s/p pacemaker presents with urinary changes. Admitted  for treatment of UTI.   PE:  Gen: thin, elderly female lying in bed in NAD  CV: RRR. No murmurs appreciated.  Lungs: Faint bibasilar crackles otherwise clear. Good air movement.  Abd: +BS, soft, NTND  Ext: No LE edema.   A/P:  E. Coli Bacteriemia: Blood culture x2 with GNR. ID Panel showed E. Coli. Will increase to CTX to 2g daily. Reassuring that patient has remained afebrile, with stable vital signs, and resolution of leukocytosis. Urine cx collected at clinic on 1/29 is still pending. Will await sensitivities and plan to transition to PO abx tomorrow. Additional 500 cc bolus given today. Encourage PO intake. Good UOP. Will d/c IVFs given risk of volume overload with h/o CHF.  AKI on CKD III: Cr improved to 1.6 from 2.4 (bl 1.3) with IVFs. Continue to monitor on BMET.  Hypovolemic Hyponatremia: Resolved with IVFs. Na now 138.  Fifth Metatarsal Fracture: Evaluate how patient tolerates ambulation. PT consulted.  HFpEF  HTN: Euvolemic and normotensive. Continue home Metoprolol.  H/o AFib: Continue Eliquis.  Depression: Holding Cymbalta due to AKI with contraindication to use with CrCl <  30. Will resume tomorrow if kidney function continues to improve. Otherwise, will initiate therapy with SSRI to avoid withdrawal from abrupt discontinuation.    Phill Myron, D.O. 06/01/2017, 10:57 AM PGY-3, [place]

## 2017-06-01 NOTE — Progress Notes (Signed)
PHARMACY - PHYSICIAN COMMUNICATION CRITICAL VALUE ALERT - BLOOD CULTURE IDENTIFICATION (BCID)  Jordan [name] is an 84 y.o. female who presented to [facility] on 05/31/2017 with a chief complaint of altered mental status/malaise   Assessment:  Urinary source  Name of physician (or Provider) Contacted: FMTS  Current antibiotics: Ceftriaxone 1g IV q24h  Changes to prescribed antibiotics recommended:  Increase ceftriaxone to 2g IV q24h   Results for orders placed or performed during the hospital encounter of 05/31/17  Blood Culture ID Panel (Reflexed) (Collected: 05/31/2017 11:43 AM)  Result Value Ref Range   Enterococcus species NOT DETECTED NOT DETECTED   Listeria monocytogenes NOT DETECTED NOT DETECTED   Staphylococcus species NOT DETECTED NOT DETECTED   Staphylococcus aureus NOT DETECTED NOT DETECTED   Streptococcus species NOT DETECTED NOT DETECTED   Streptococcus agalactiae NOT DETECTED NOT DETECTED   Streptococcus pneumoniae NOT DETECTED NOT DETECTED   Streptococcus pyogenes NOT DETECTED NOT DETECTED   Acinetobacter baumannii NOT DETECTED NOT DETECTED   Enterobacteriaceae species DETECTED (A) NOT DETECTED   Enterobacter cloacae complex NOT DETECTED NOT DETECTED   Escherichia coli DETECTED (A) NOT DETECTED   Klebsiella oxytoca NOT DETECTED NOT DETECTED   Klebsiella pneumoniae NOT DETECTED NOT DETECTED   Proteus species NOT DETECTED NOT DETECTED   Serratia marcescens NOT DETECTED NOT DETECTED   Carbapenem resistance NOT DETECTED NOT DETECTED   Haemophilus influenzae NOT DETECTED NOT DETECTED   Neisseria meningitidis NOT DETECTED NOT DETECTED   Pseudomonas aeruginosa NOT DETECTED NOT DETECTED   Candida albicans NOT DETECTED NOT DETECTED   Candida glabrata NOT DETECTED NOT DETECTED   Candida krusei NOT DETECTED NOT DETECTED   Candida parapsilosis NOT DETECTED NOT DETECTED   Candida tropicalis NOT DETECTED NOT DETECTED    Narda Bonds 06/01/2017  4:57 AM

## 2017-06-01 NOTE — Progress Notes (Signed)
PT Cancellation Note  Patient Details Name: Jordan [name] MRN: [id number] DOB: Aug 18, 1919   Cancelled Treatment:    Reason Eval/Treat Not Completed: Other (comment).  Pt in BR and family declined PT stating pt needed a bath first.  Female staff member arrived and family declined them for female.  Will try later as time and pt allow.   Ramond Dial 06/01/2017, 10:34 AM   Mee Hives, PT MS Acute Rehab Dept. Number: [place]

## 2017-06-01 NOTE — Progress Notes (Signed)
Per medical team, patient discharging home due to bad experiences at [facility].   CSW signing off.  Percell Locus Thornton Dohrmann LCSW [phone]

## 2017-06-01 NOTE — Evaluation (Signed)
Physical Therapy Evaluation Patient Details Name: Jordan [name] MRN: [id number] DOB: 01/15/1920 Today's Date: 06/01/2017   History of Present Illness  84 yo female with onset of UTI was admitted, had fracture to R foot 10 days ago and note R 5th metatarsal is fractured.  PMHx:  OA, HTN, Alz, CHF, pacemaker, CKD, EF 60-65%  Clinical Impression  Pt is up to walk with assistance and will need ongoing help to progress gait as needed for management of her weakness and cognitive changes.  Daughter who is caregiver is going to decline SNF and talked with case manager and SW for discussion of options, but daughter is open to the idea that pt may need more help than estimated by herself.  Follow acutely for strengthening and mobiltiy as tolerated.    Follow Up Recommendations SNF    Equipment Recommendations  None recommended by PT    Recommendations for Other Services       Precautions / Restrictions Precautions Precautions: Fall Precaution Comments: Spanish language preferred, no English Required Braces or Orthoses: Other Brace/Splint(R foot ortho shoe) Restrictions Weight Bearing Restrictions: No Other Position/Activity Restrictions: wear ortho shoe as needed for comfort to R foot injury      Mobility  Bed Mobility Overal bed mobility: Needs Assistance Bed Mobility: Sit to Supine       Sit to supine: Mod assist   General bed mobility comments: mod assist to lift legs and help pt pivot to bed  Transfers Overall transfer level: Needs assistance Equipment used: Rolling walker (2 wheeled);1 person hand held assist Transfers: Sit to/from Stand Sit to Stand: Mod assist;Min assist         General transfer comment: power up assistance then was steadied at walker, then could walk   Ambulation/Gait Ambulation/Gait assistance: Min assist Ambulation Distance (Feet): 90 Feet Assistive device: Rolling walker (2 wheeled);1 person hand held assist Gait Pattern/deviations: Step-to  pattern;Decreased stride length;Decreased weight shift to right;Wide base of support;Trunk flexed Gait velocity: reduced Gait velocity interpretation: Below normal speed for age/gender General Gait Details: very slow progression with help to advance walker and change directions as well as safety due to frequent stops to catch her breath  Stairs            Wheelchair Mobility    Modified Rankin (Stroke Patients Only)       Balance Overall balance assessment: Needs assistance Sitting-balance support: Feet supported;Bilateral upper extremity supported Sitting balance-Leahy Scale: Fair     Standing balance support: Bilateral upper extremity supported;During functional activity Standing balance-Leahy Scale: Poor                               Pertinent Vitals/Pain Pain Assessment: Faces Faces Pain Scale: Hurts little more Pain Location: R foot and overall  Pain Descriptors / Indicators: Sore Pain Intervention(s): Limited activity within patient's tolerance;Monitored during session;Repositioned    Home Living Family/patient expects to be discharged to:: Private residence Living Arrangements: Children Available Help at Discharge: Family;Available 24 hours/day Type of Home: House Home Access: Stairs to enter Entrance Stairs-Rails: None Entrance Stairs-Number of Steps: 1 Home Layout: One level Home Equipment: Walker - 2 wheels;Wheelchair - manual Additional Comments: pt is on RW very short trips with family at home and wc for community mobility    Prior Function Level of Independence: Needs assistance   Gait / Transfers Assistance Needed: RW supervised with family  ADL's / Homemaking Assistance Needed: family handles cooking  and cleaning        Hand Dominance   Dominant Hand: Right    Extremity/Trunk Assessment   Upper Extremity Assessment Upper Extremity Assessment: Overall WFL for tasks assessed;Generalized weakness    Lower Extremity  Assessment Lower Extremity Assessment: Generalized weakness    Cervical / Trunk Assessment Cervical / Trunk Assessment: Kyphotic  Communication   Communication: Prefers language other than English  Cognition Arousal/Alertness: Awake/alert Behavior During Therapy: WFL for tasks assessed/performed Overall Cognitive Status: History of cognitive impairments - at baseline                                 General Comments: Alz dementia dx      General Comments      Exercises     Assessment/Plan    PT Assessment Patient needs continued PT services  PT Problem List Decreased strength;Decreased range of motion;Decreased activity tolerance;Decreased balance;Decreased mobility;Decreased cognition;Decreased coordination;Decreased knowledge of use of DME;Decreased safety awareness;Decreased knowledge of precautions;Cardiopulmonary status limiting activity;Pain;Decreased skin integrity(bruising and pain R 5th metatarsal and )       PT Treatment Interventions DME instruction;Gait training;Stair training;Functional mobility training;Therapeutic activities;Therapeutic exercise;Balance training;Neuromuscular re-education;Patient/family education    PT Goals (Current goals can be found in the Care Plan section)  Acute Rehab PT Goals Patient Stated Goal: none stated PT Goal Formulation: With family Time For Goal Achievement: 06/15/17 Potential to Achieve Goals: Good    Frequency Min 3X/week   Barriers to discharge Inaccessible home environment;Other (comment)(continual physical assist for all gait needed)      Co-evaluation               AM-PAC PT "6 Clicks" Daily Activity  Outcome Measure Difficulty turning over in bed (including adjusting bedclothes, sheets and blankets)?: Unable Difficulty moving from lying on back to sitting on the side of the bed? : Unable Difficulty sitting down on and standing up from a chair with arms (e.g., wheelchair, bedside commode,  etc,.)?: Unable Help needed moving to and from a bed to chair (including a wheelchair)?: A Lot Help needed walking in hospital room?: A Little Help needed climbing 3-5 steps with a railing? : Total 6 Click Score: 9    End of Session Equipment Utilized During Treatment: Gait belt Activity Tolerance: Patient tolerated treatment well;Patient limited by fatigue Patient left: in bed;with call bell/phone within reach;with family/visitor present;with nursing/sitter in room Nurse Communication: Mobility status PT Visit Diagnosis: Unsteadiness on feet (R26.81);Adult, failure to thrive (R62.7);Pain;Other (comment)(pain on R foot fracture worsened over walk) Pain - Right/Left: Right Pain - part of body: Ankle and joints of foot    Time: 1540-0867 PT Time Calculation (min) (ACUTE ONLY): 31 min   Charges:   PT Evaluation $PT Eval Moderate Complexity: 1 Mod PT Treatments $Gait Training: 8-22 mins   PT G Codes:   PT G-Codes **NOT FOR INPATIENT CLASS** Functional Assessment Tool Used: AM-PAC 6 Clicks Basic Mobility    Ramond Dial 06/01/2017, 11:58 AM   Mee Hives, PT MS Acute Rehab Dept. Number: [place] [facility] [place]

## 2017-06-01 NOTE — Progress Notes (Signed)
Orthopedic Tech Progress Note Patient Details:  Jordan [name] Jun 26, 1919 [id number]  Ortho Devices Type of Ortho Device: Prafo boot/shoe Ortho Device/Splint Location: RLE Ortho Device/Splint Interventions: Ordered, Application   Post Interventions Patient Tolerated: Well Instructions Provided: Care of device   Braulio Bosch 06/01/2017, 12:19 PM

## 2017-06-02 LAB — BASIC METABOLIC PANEL
Anion gap: 6 (ref 5–15)
BUN: 29 mg/dL — AB (ref 6–20)
CHLORIDE: 106 mmol/L (ref 101–111)
CO2: 25 mmol/L (ref 22–32)
CREATININE: 1.73 mg/dL — AB (ref 0.44–1.00)
Calcium: 8.4 mg/dL — ABNORMAL LOW (ref 8.9–10.3)
GFR calc Af Amer: 28 mL/min — ABNORMAL LOW (ref >60)
GFR calc non Af Amer: 24 mL/min — ABNORMAL LOW (ref >60)
GLUCOSE: 90 mg/dL (ref 65–99)
Potassium: 4.7 mmol/L (ref 3.5–5.1)
SODIUM: 137 mmol/L (ref 135–145)

## 2017-06-02 LAB — CBC
HCT: 31.5 % — ABNORMAL LOW (ref 36.0–46.0)
Hemoglobin: 10.4 g/dL — ABNORMAL LOW (ref 12.0–15.0)
MCH: 32.8 pg (ref 26.0–34.0)
MCHC: 33 g/dL (ref 30.0–36.0)
MCV: 99.4 fL (ref 78.0–100.0)
PLATELETS: 88 10*3/uL — AB (ref 150–400)
RBC: 3.17 MIL/uL — ABNORMAL LOW (ref 3.87–5.11)
RDW: 14.1 % (ref 11.5–15.5)
WBC: 4.5 10*3/uL (ref 4.0–10.5)

## 2017-06-02 MED ORDER — CEPHALEXIN 500 MG PO CAPS: 500.0000 mg | ORAL_CAPSULE | Freq: Four times a day (QID) | ORAL | 0 refills | Status: DC

## 2017-06-02 MED ORDER — BLISTEX MEDICATED EX OINT
TOPICAL_OINTMENT | CUTANEOUS | Status: DC | PRN
  Filled 2017-06-02: qty 6.3

## 2017-06-02 MED ORDER — VENLAFAXINE HCL 50 MG PO TABS: 50.0000 mg | ORAL_TABLET | Freq: Two times a day (BID) | ORAL | 0 refills | Status: DC

## 2017-06-02 NOTE — Consult Note (Signed)
[facility] CM Primary Care Navigator  06/02/2017  Jordan [name] Jordan [name] Jordan [name]   Met with [name])at the bedside to identify possible discharge needs.  Patient is Spanish- speaking but daughter fluently speaks [place] and translating information to patient.   Daughter reports that patient was "very weak and had low blood pressure that resulted to this admission.  Patient's daughter endorses Dr.Todd [name] with [facility] as the primary care provider.   [facility] [address] [facility].  Patient's medications are being managed by [name] and [name] using "pill box" system filled weekly.  Patient 's son and daughter have been providing transportation to herdoctors'appointments.  Patient lives with son. Daughter reports that patient has a home aide who provides care Mon- Fri from 8 am- 5 pm, otherwise, children are providing assistance with care needs at home.  Anticipated discharge plan ishomewith home health services according to daughter.  Patient's daughtervoiced understanding to call primary care provider's office for a post discharge follow-up appointment within1-2 weeksor sooner if needs arise.Patient letter (with PCP's contact number) was provided as a reminder.  Explained to patient and daughter regardingTHN CM services available for health management and resources at home but daughterverbalizedthat patient is being managed well between son, home aide and herself. Patient's daughter is knowledgeable and well-informed on ways to manage chronic health issues (including HF).   Patient's daughter expressed understandingof need to seek referral from primary care provider to [facility] care management if necessary and appropriate for services in thenearfuture. Daughter verbalized of plan to discuss  with primary care provider for further services appropriate after completion of home health services.  [facility] care management information provided for future needs that may arise.  Patient/ daughter however, verbally agreed and opted for EMMIGeneralcalls to follow-upwithrecovery at home.  Referral made forEMMIGeneralcalls after discharge.   For questions, please contact:  Jordan [name], BSN, RN- [facility] Primary Care Navigator  Telephone: [phone] Jordan [name]

## 2017-06-02 NOTE — Discharge Instructions (Signed)
You were treated for an infection in your urinary tract and blood stream with antibiotics during your hospital admission. You were also seen for a small fracture of your right foot.   Please continue to drink lots of fluids and rest. You can walk around as normal and use the special shoe the physical therapist provided to help your foot heal. We will call the correct antibiotics to your pharmacy. You should start taking them tomorrow (Feb 2nd) morning. Please take the antibiotic (Keflex) four times a day.  We changed your Cymbalta to a different medication called Effexor. Please take Effexor 1 tablet twice a day. Please STOP the Cymbalta.

## 2017-06-02 NOTE — Discharge Summary (Signed)
[facility] Discharge Summary  [name] Medical record number: [id number] Date of birth: 1919/09/05 Age: 84 y.o. Gender: female Date of Admission: 05/31/2017  Date of Discharge: 06/02/17 Admitting Physician: Jordan Covert, MD  Primary Care Provider: McDiarmid, Blane Ohara, MD Consultants: none  Indication for Hospitalization: E. Coli bacteremia, sepsis  Discharge Diagnoses/Problem List:  UTI c/b sepsis, E. Coli bacteremia Hypothyroidism Dementia CKD Stage 3 HTN Osteoarthritis  Disposition: home  Discharge Condition: Good  Discharge Exam:  General: Alert and oriented x3, NAD, resting comfortably in bed, son at bedside serving as interpreter (declined interpreter) Cardiovascular: RRR, no m/r/g Respiratory: Bibasilar crackles, possibly mildly improved on R side but largely unchanged from previous exam. Normal WOB on RA.  Abdomen: Soft, nontender, nondistended Extremities: WWP, no peripheral edema. No tenderness over lateral aspect of R foot  Brief Hospital Course:  Please see H&P for full details of presentation and initial workup. Briefly, Jordan [name] a 84 y.o F with past medical history significant for HTN, hypothyroidism, Alzheimer's dementia, HFpEF (EF 2016 60-65%), CKD III, and heart block s/p pacemaker placement in 2015 who presented to the ED 1/30 with urinary changes, malaise, AMS, and low BP recordings at home. She had been prescribed cephalexin for suspected UTI the night prior to presentation, which she received one dose of. On admission, her vital signs were normal and stable but labs were notable for thrombocytopenia and AKI, consistent with early sepsis. She had E. Coli positive blood cultures x2 and a urine culture which also grew E. Coli. She was treated with IVF and started on CTX (1/30-2/1). Urine culture sensitivities were pending at the time of discharge, so patient was discharged with Keflex for a total 7 day course. Of  note, patient also had an xray with R 5th metatarsal fracture after twisting her ankle two weeks prior to presentation. She was seen by PT and fitted for boot given mild pain with ambulation. Her home duloxetine was held during admission given her renal function and was transitioned to Effexor 50mg  bid on discharge at the recommendation of her PCP. The remainder of her home medications were continued during admission. At the time of discharge, she had good PO intake and UOP and was able to ambulate with boot in place and with her walker.   Issues for Follow Up:  1. Urine culture grew E. Coli. Sensitivities were pending at the time of discharge. She was discharged with a 7 day course of Keflex. Please follow-up her urine culture sensitivities and switch her antibiotic if appropriate. 2. Consider repeat BMP to evaluate renal function after infection resolves given Cr at discharge 1.73 (with baseline thought to be 1.1-1.3) 3. Changed duloxetine 60mg  daily to venlafaxine 50mg  bid, given Cr Cl <30  Significant Procedures: none  Significant Labs and Imaging:  Recent Labs  Lab 05/31/17 1140 06/01/17 0504 06/02/17 0257  WBC 17.8* 7.6 4.5  HGB 10.3* 9.9* 10.4*  HCT 31.8* 30.8* 31.5*  PLT 104* 81* 88*   Recent Labs  Lab 05/31/17 1140 06/01/17 0504 06/02/17 0257  NA 130* 138 137  K 4.7 3.4* 4.7  CL 96* 110 106  CO2 25 21* 25  GLUCOSE 99 83 90  BUN 38* 31* 29*  CREATININE 2.41* 1.63* 1.73*  CALCIUM 7.9* 6.6* 8.4*  ALKPHOS 98  --   --   AST 37  --   --   ALT 25  --   --   ALBUMIN 2.6*  --   --  Results/Tests Pending at Time of Discharge: urine culture sensitivities   Discharge Medications:  Allergies as of 06/02/2017      Reactions   Chlorthalidone Other (See Comments)   Hyponatremia   Honey Bee Treatment [bee Venom] Swelling, Rash   Verapamil Other (See Comments)   Bradycardia - HR  35-45 on 40mg  TID   Adhesive [tape] Rash, Other (See Comments)   Skin irritation   Amlodipine  Other (See Comments)   Peripheral edema with 2.5mg  daily dose    Med Rec must be completed prior to using this [facility]     Discharge Instructions: Please refer to Patient Instructions section of EMR for full details.  Patient was counseled important signs and symptoms that should prompt return to medical care, changes in medications, dietary instructions, activity restrictions, and follow up appointments.   Follow-Up Appointments: Follow-up Information    Jordan Feil, MD Follow up on 06/06/2017.   Specialty:  Family Medicine Why:  Hospital follow-up appointment at 9:10AM. Contact information: [place] [address] [phone]           Jordan Hua, MD 06/02/2017, 4:17 PM

## 2017-06-02 NOTE — Progress Notes (Signed)
Family Medicine Teaching Service Daily Progress Note Intern Pager: [phone]  [name] Medical record number: [id number] Date of birth: [birth year] Age: 84 y.o. Gender: female  Primary Care Provider: McDiarmid, Blane Ohara, MD Consultants: none Code Status: full  Pt Overview and Major Events to Date:  1/30: Admitted, started CTX 1/31: Continued CTX, d/c IVF given good PO intake  Assessment and Plan: Jordan [name] Diazis a 84 y.o.femalepresenting with urinary changes, malaise, and low BP recordings at home and found to have E. Coli bacteremia and sepsis. PMH is significant for HTN, hypothyroidism, Alzheimer's dementia, HFpEF (EF 2016 60-65%), CKD III, heart block s/p pacemaker placement in 2015, and osteoarthritis.   E. Coli Bacteremia: Initially septic, but now resolved. Very well-appearing this morning. Blood culture x2 positive for E. Coli on admission. She was started on CTX on admission and since has had resolution of her leukocytosis, her vital signs have remained stable, and she has had urine output >0.91ml/kg/hr. Given improvement, we will transition to PO antibiotics today pending culture sensitivities, likely cephalexin.  - Remain on MedSurg, vital signs per unit - D/c CTX (1g on 1/30, started 2g 1/31 - 2/1), s/p one dose cephalexin 1/29 - Start Keflex on discharge - Given adequate UOP, will not give IVF - acetaminophen 1g Q8H prn pain - continue home fesoterodine for urinary incontinence  CKD Stage III  AKI: Cr worsened today to 1/73 from 1.63 (baseline ~1.1-1.3). Likely prerenal given poor PO intake, possibly with some element of worsening baseline CKD. Cr likely worsened today given discontinuation of IVF yesterday.  - Daily BMP, CTM - Renally dose meds as needed and avoid nephrotoxic agents  Hypovolemic Hyponatremia: resolved. Na 130 on admission.  - Daily BMP  Fifth Metatarsal Fracture: Patient "twisted ankle" when ambulating from chair to walker ~ 2wks ago. Xray  on admission with distal 5th metatarsal fracture. Patient has mild pain with ambulation - tylenol for pain  - PT consult; provided boot for patient.   Depression: Continue home trazodone, held homeduloxetinedue to current CrCl <30 in setting of presumed AKI. - Will discuss alternative SNRI vs holding duloxetine with PCP  HFpEF  HTN: EF 2016 60-65%. Currently on metoprolol. Some LL crackles on exam but largely unchanged from admission. CXR without pulmonary edema.  Given patient dry on exam, will initiate fluids but plan to closely monitor fluid status.  - continue metoprolol 25mg  once daily given stable BP - d/c'ed IVF 1/31  Chronic, Stable: Glaucoma: Continue home latanoprost, cyclosporine eye drops Gout: continue home allopurinol and colchicine GERD:substitute pantoprazole for home esomeprazole Hypothyroidism:TSH wnl on admission - Continue home levothyroxine Hx A Fib, Heartblock s/p Pacemaker: Chronically anticoagulated on eliquis.  - continue eliquis 2.5mg  BID  FEN/GI:regular diet, miralax/docusate prn, NS 100 cc/hr x24h Prophylaxis:on home apixaban  Disposition:admit to FPTS; attending Dr. Erin Hearing; anticipate d/c tohome with home health aide  Subjective:  Family declined translator, patient's son at bedside serving as interpreter.  Patient reports feeling well this morning and ready to go home. She had resolution of her chills yesterday and feels more awake today. She continues to have her baseline chronic back pain. She is amenable to increasing her PO intake over the next few days. She states she is ready to go home today.  Objective: Temp:  [97.8 F (36.6 C)-98.8 F (37.1 C)] 98.8 F (37.1 C) (02/01 0536) Pulse Rate:  [68-71] 68 (02/01 0536) Resp:  [17-18] 18 (02/01 0536) BP: (129-141)/(52-55) 136/55 (02/01 0536) SpO2:  [95 %-98 %]  95 % (02/01 0536) Weight:  [121 lb 4.1 oz (55 kg)] 121 lb 4.1 oz (55 kg) (02/01 0500) Physical Exam: General: Alert and  oriented x3, NAD, resting comfortably in bed, son at bedside serving as interpreter (declined interpreter) Cardiovascular: RRR, no m/r/g Respiratory: Bibasilar crackles, possibly mildly improved on R side but largely unchanged from previous exam. Normal WOB on RA.  Abdomen: Soft, nontender, nondistended Extremities: WWP, no peripheral edema. No tenderness over lateral aspect of R foot  Laboratory: Recent Labs  Lab 05/31/17 1140 06/01/17 0504 06/02/17 0257  WBC 17.8* 7.6 4.5  HGB 10.3* 9.9* 10.4*  HCT 31.8* 30.8* 31.5*  PLT 104* 81* 88*   Recent Labs  Lab 05/31/17 1140 06/01/17 0504 06/02/17 0257  NA 130* 138 137  K 4.7 3.4* 4.7  CL 96* 110 106  CO2 25 21* 25  BUN 38* 31* 29*  CREATININE 2.41* 1.63* 1.73*  CALCIUM 7.9* 6.6* 8.4*  PROT 5.3*  --   --   BILITOT 0.4  --   --   ALKPHOS 98  --   --   ALT 25  --   --   AST 37  --   --   GLUCOSE 99 83 90   Imaging/Diagnostic Tests: none  Dimple Casey, Medical Student 06/02/2017, 7:44 AM MS-4, [place] Intern pager: [phone], text pages welcome  FPTS Upper-Level Resident Addendum  I have independently interviewed and examined the patient. I have discussed the above with the original author and agree with their documentation. My edits for correction/addition/clarification are in blue. Please see also any attending notes.   Hyman Bible, MD PGY-3, [place] Service pager: [phone] (text pages welcome through [place])

## 2017-06-03 LAB — URINE CULTURE

## 2017-06-03 LAB — CULTURE, BLOOD (ROUTINE X 2): Special Requests: ADEQUATE

## 2017-06-05 ENCOUNTER — Encounter: Payer: Self-pay | Admitting: Family Medicine

## 2017-06-05 ENCOUNTER — Telehealth: Payer: Self-pay | Admitting: Family Medicine

## 2017-06-05 ENCOUNTER — Other Ambulatory Visit: Payer: Self-pay | Admitting: Family Medicine

## 2017-06-05 ENCOUNTER — Other Ambulatory Visit: Payer: Self-pay | Admitting: *Deleted

## 2017-06-05 DIAGNOSIS — R2689 Other abnormalities of gait and mobility: Secondary | ICD-10-CM

## 2017-06-05 DIAGNOSIS — S92351D Displaced fracture of fifth metatarsal bone, right foot, subsequent encounter for fracture with routine healing: Secondary | ICD-10-CM

## 2017-06-05 DIAGNOSIS — R54 Age-related physical debility: Secondary | ICD-10-CM

## 2017-06-05 HISTORY — DX: Displaced fracture of fifth metatarsal bone, right foot, subsequent encounter for fracture with routine healing: S92.351D

## 2017-06-05 MED ORDER — ESOMEPRAZOLE MAGNESIUM 40 MG PO CPDR: 40.0000 mg | DELAYED_RELEASE_CAPSULE | Freq: Every day | ORAL | 99 refills | Status: DC

## 2017-06-05 MED ORDER — LIDOCAINE 5 % EX OINT: 1.0000 "application " | TOPICAL_OINTMENT | CUTANEOUS | 0 refills | Status: DC | PRN

## 2017-06-05 MED ORDER — FUROSEMIDE 20 MG PO TABS: 20.0000 mg | ORAL_TABLET | Freq: Every day | ORAL | 3 refills | Status: DC

## 2017-06-05 MED ORDER — TRAZODONE HCL 50 MG PO TABS: 50.0000 mg | ORAL_TABLET | Freq: Every day | ORAL | 3 refills | Status: DC

## 2017-06-05 NOTE — Telephone Encounter (Signed)
Will check with MD.  Arrin Pintor,CMA  

## 2017-06-05 NOTE — Telephone Encounter (Signed)
Pt daughter calling again because at the hospital, the physical therapist told them they would be getting a in home health aide from [facility] instead of sending the pt somewhere. The daughter is wanting to know when they will be hearing anything about this and when it will start. Please advise

## 2017-06-05 NOTE — Telephone Encounter (Signed)
Will forward to MD. [name],CMA  

## 2017-06-05 NOTE — Telephone Encounter (Signed)
I spoke with [name], Jordan [name].   She called [facility] to see if there were orders for hh physical therapy from  Hospital and she reports that she was told that there were no orders for PT.  [name] concerned that her mother's BP has had SBP in 160 to 170 range. Patient rescheduled to see Dr Mingo Amber for Hospital F/U @ 2 pm on 06/07/17.   Will send in orders for PT.

## 2017-06-05 NOTE — Telephone Encounter (Signed)
This pt was discharged from [facility] on Friday and part of her blood work test wouldn't come back until after the weekend for which strain of bacteria she was actually positive for. Pt daughter wants to know what the results were and if she needs to continue with the same antibiotics. Please advise

## 2017-06-06 ENCOUNTER — Ambulatory Visit: Payer: Self-pay | Admitting: Family Medicine

## 2017-06-07 ENCOUNTER — Ambulatory Visit (INDEPENDENT_AMBULATORY_CARE_PROVIDER_SITE_OTHER): Payer: Medicare Other | Admitting: Family Medicine

## 2017-06-07 VITALS — BP 157/60 | HR 70 | Temp 98.0°F | Wt 126.0 lb

## 2017-06-07 DIAGNOSIS — R197 Diarrhea, unspecified: Secondary | ICD-10-CM | POA: Diagnosis not present

## 2017-06-07 NOTE — Patient Instructions (Addendum)
Jordan [name] was seen today for hospital follow-up.  With all of her loose bowel movements I am concerned that she may have an infection called C. Difficile.  This can be caused when people are on prolonged courses of antibiotics and essentially wipes out your "good" bacteria.   I want to treat her with antibiotic called vancomycin and she will take this 4 times a day for 10 days.  Hopefully as she begins to take this medication her appetite will come back.  She needs to work on taking sips of water and eat to stay hydrated.  If she has worsening diarrhea, worsening abdominal pain or fevers she needs to come back to clinic as she may need to be hospitalized.\  Feel free to call the office with any questions or concerns.   Please follow up with Dr. Wendy Poet on 06/15/17 at 2:00PM  Take care, Quillian Quince L. Rosalyn Gess, [place] Resident PGY-2 06/07/2017 3:50 PM

## 2017-06-07 NOTE — Progress Notes (Signed)
    Subjective:  Jordan [name] is a 84 y.o. female who presents to the [facility] today for hospital follow up.   HPI:  Is a 84 year old female with a history of hypertension, hypothyroidism, Alzheimer's dementia, heart failure preserved ejection fraction, CKD stage III heart blocker status post pacemaker placement 2015 who was admitted for urinary changes, malaise and altered mental status.  She was found to have E. coli UTI as well as bacteremia.  She was treated with IV antibiotics and discharged with a course of Keflex.  She reports no dysuria, chest pain, shortness of breath but has had loose bowel movements over the past several days described as watery without blood.  Additionally she has had decreased p.o. intake and some mild abdominal pain.  She is only been able to keep down Ensure, some soup and animal crackers over the past 24 hours.   PMH: As above Tobacco use: None Medication: reviewed and updated ROS: see HPI   Objective:  Physical Exam: BP (!) 157/60 (BP Location: Left Arm, Patient Position: Sitting, Cuff Size: Normal)   Pulse 70   Temp 98 F (36.7 C) (Oral)   Wt 126 lb (57.2 kg)   SpO2 96%   BMI 23.05 kg/m   Gen: 84yo F in NAD, resting comfortably CV: RRR with no murmurs appreciated Pulm: NWOB, CTAB with no crackles, wheezes, or rhonchi GI: Normal bowel sounds present. Soft, Nontender, Nondistended. MSK: no edema, cyanosis, or clubbing noted Skin: warm, dry Neuro: grossly normal, moves all extremities Psych: Normal affect and thought content  No results found for this or any previous visit (from the past 72 hour(s)).   Assessment/Plan:  Diarrhea of presumed infectious origin Recently hospitalized for urosepsis and received prolonged course of antibiotics.  Now with several days of loose stools.  On exam she was comfortable in no acute distress with moist mucous membranes and without abdominal discomfort.  Collected stool sample and will treat empirically for C.  Difficile with vancomycin 125 mg 4 times daily times 10 days.  Discussed strict return precautions.  Patient has follow-up appointment with Dr. Wendy Poet on 06/15/2017.   [name] L. Rosalyn Gess, [place] Medicine Resident PGY-2 06/12/2017 8:52 AM

## 2017-06-08 ENCOUNTER — Telehealth: Payer: Self-pay

## 2017-06-08 ENCOUNTER — Telehealth: Payer: Self-pay | Admitting: Family Medicine

## 2017-06-08 NOTE — Telephone Encounter (Signed)
Please call orders to have [facility] nurse to see patient about c diff.

## 2017-06-08 NOTE — Telephone Encounter (Signed)
Will forward to Dr. Rosalyn Gess who saw patient for this visit. Undine Nealis,CMA

## 2017-06-08 NOTE — Telephone Encounter (Signed)
Pt daughter would like to know the test results from her mother's appointment yesterday. Please call back at [phone].

## 2017-06-08 NOTE — Telephone Encounter (Signed)
Results are not back. Will call when they are available.    Thanks, DR Rosalyn Gess

## 2017-06-08 NOTE — Telephone Encounter (Signed)
Mingo Amber, PT with [facility] called to let PCP know they did not get to start PT yesterday and patient daughter does not want them to start until next week due to recent diagnosis of c.diff.  Mingo Amber would like verbal orders to have [facility] nursing go out to see the patient for the c.diff before PT goes out. Call back is [phone]. Danley Danker, RN [facility] RN)

## 2017-06-08 NOTE — Telephone Encounter (Signed)
Verbal order given. [name],CMA

## 2017-06-09 NOTE — Telephone Encounter (Signed)
Daughter aware of message from MD.  Patient has stopped the Keflex and will wait for final results of her stool results.  [name] wanted to check with Dr. McDiarmid to see if he can put in orders with [facility] to go out for a nursing evaluation with patient.  She is going to start physical therapy next week as she wasn't feeling weel today and cancelled it.  [name],CMA

## 2017-06-09 NOTE — Telephone Encounter (Signed)
Please let Jordan [name] know that I reviewed her mother's office visit and I agree with Dr Nida Boatman management with oral Vancomycin until C diff tests are back.    Please make sure that Jordan Dario has stopped the cephazolin (Keflex) antibiotic that her mother was taking for germs in her bladder and blood.   Thank you.

## 2017-06-09 NOTE — Telephone Encounter (Signed)
Spoke with daughter and gave her the information below. Daughter wants Dr. Rosalyn Gess to know that mother continues with loose stools. Ottis Stain, CMA

## 2017-06-12 ENCOUNTER — Telehealth: Payer: Self-pay | Admitting: Family Medicine

## 2017-06-12 ENCOUNTER — Encounter: Payer: Self-pay | Admitting: Family Medicine

## 2017-06-12 NOTE — Telephone Encounter (Signed)
Will forward to MD to advise. [name],CMA  

## 2017-06-12 NOTE — Telephone Encounter (Signed)
Daughter informed and voiced understanding. Saylee Sherrill,CMA

## 2017-06-12 NOTE — Telephone Encounter (Signed)
Jordan [name] wants to know if her mother should continue to take the antibotic. She is seeing Dr McDIarmid tomorrow and didn't know if she should take the antibotic today. Please advise

## 2017-06-12 NOTE — Telephone Encounter (Signed)
Please advise Jordan [name] that her mother should stop the antibiotics.

## 2017-06-13 ENCOUNTER — Encounter: Payer: Self-pay | Admitting: Family Medicine

## 2017-06-13 ENCOUNTER — Ambulatory Visit (INDEPENDENT_AMBULATORY_CARE_PROVIDER_SITE_OTHER): Payer: Medicare Other | Admitting: Family Medicine

## 2017-06-13 ENCOUNTER — Other Ambulatory Visit: Payer: Self-pay

## 2017-06-13 VITALS — BP 110/58 | HR 66 | Temp 98.8°F | Wt 125.0 lb

## 2017-06-13 DIAGNOSIS — R634 Abnormal weight loss: Secondary | ICD-10-CM

## 2017-06-13 DIAGNOSIS — R1319 Other dysphagia: Secondary | ICD-10-CM

## 2017-06-13 DIAGNOSIS — N183 Chronic kidney disease, stage 3 unspecified: Secondary | ICD-10-CM

## 2017-06-13 DIAGNOSIS — S92351D Displaced fracture of fifth metatarsal bone, right foot, subsequent encounter for fracture with routine healing: Secondary | ICD-10-CM

## 2017-06-13 DIAGNOSIS — R1310 Dysphagia, unspecified: Secondary | ICD-10-CM

## 2017-06-13 DIAGNOSIS — F329 Major depressive disorder, single episode, unspecified: Secondary | ICD-10-CM

## 2017-06-13 DIAGNOSIS — N179 Acute kidney failure, unspecified: Secondary | ICD-10-CM | POA: Diagnosis not present

## 2017-06-13 DIAGNOSIS — R0989 Other specified symptoms and signs involving the circulatory and respiratory systems: Secondary | ICD-10-CM

## 2017-06-13 DIAGNOSIS — F32A Depression, unspecified: Secondary | ICD-10-CM

## 2017-06-13 LAB — C DIFFICILE, CYTOTOXIN B

## 2017-06-13 LAB — C DIFFICILE TOXINS A+B W/RFLX: C DIFFICILE TOXINS A+B, EIA: NEGATIVE

## 2017-06-13 NOTE — Progress Notes (Signed)
   Subjective:    Patient ID: Jordan [name], female    DOB: 1919/10/07, 84 y.o.   MRN: [id number] Jordan [name] is accompanied by daughter Sources of clinical information for visit is/are patient, relative(s) and past medical records. Nursing assessment for this office visit was reviewed with the patient for accuracy and revision.  Previous Report(s) Reviewed: historical medical records  [name] declined interpreter.  [name] is fluent in english-spanish Depression screen PHQ 2/9 04/06/2017  Decreased Interest 0  Down, Depressed, Hopeless 0  PHQ - 2 Score 0  Altered sleeping -  Tired, decreased energy -  Change in appetite -  Feeling bad or failure about yourself  -  Trouble concentrating -  Moving slowly or fidgety/restless -  Suicidal thoughts -  PHQ-9 Score -  Some recent data might be hidden   Fall Risk  04/06/2017 02/13/2017 01/19/2017 09/01/2016 05/26/2016  Falls in the past year? No No No Yes Yes  Number falls in past yr: - - - 1 1  Comment - - - - -  Injury with Fall? - - - No No  Comment - - - - -  Risk Factor Category  - - - - -  Risk for fall due to : - - - - -  Risk for fall due to: Comment - - - - -  Follow up - - - - -   HPI DIARRHEA  Onset: 06/02/17, during hospitalization for E coli sepsis   Time course: slowly improving, first partiall formed stool today.  Only one stool today  Number of stools per day: noe one a day  Previous treatment: yes, Vanc oral started  06/07/17, stopped around 06/11/17, Keflex started on 06/02/16 and stopped around 06/11/17 Vomiting: no  Abdominal Pain: no  Weight loss: yes  Decreased urine output: no  Lightheadedness: no    Red Flags Fever: no [name] brought in record of daily Temps which were within acceptable limits Bloody stools: no  Recent antibiotics: yes, Keflex and Rocephin  Immuncompromised: no   Weight loss Stable: No  7 pounds weight loss since 04/06/17 ov Constitutional symptoms: No  Medications cause: duloxetine.  Pt did not start  venlafaxine rx'd at hospital dc Eating: poorly, small bites of food since septic illness Poor Appetite:  Yes  Swallowing pxs: Possibly per dgt.  Pt with history of food impaction requiring EGD disimpaction  5/17 Oral pxs: Yes dentures Limited money: No  Infection: recovering from E coli sepsis 05/31/17 Hyperthryroid/Conn's or Adrenal Insufficiency/Diabetes/Hypercalcemia: No  Enteric pxs: No  Emotional Depression or Anxiety: Yes  Low salt or low fat diet: No  Stones, social problems (e.g., isolation, inability to obtain preferred foods): No   SH Smoking history reviewed  Review of Systems One day with coughing since coming home from hosptial    Objective:   Physical Exam VS reviewed GEN: Alert, Cooperative, Groomed, NAD, sitting in manual WC COR: RRR, No M/G/R, No JVD LUNGS: left posterior lower field crackles, clear ausc right lung, No Acc mm use, speaking in full sentences ABDOMEN: (+)BS, soft, NT, ND, No HSM, soft hernia (nontender) right lower quadrant thru ant abd wall.  DTR: Bilateral Bicep 2+, Bilateral Triceps 2+, Bilateral Knees 2+, Bilateral Ankles 1+ Gait: Normal speed, No significant path deviation, Step through +,  Psych: Normal affect/thought/speech/language    Assessment & Plan:  See problem list

## 2017-06-13 NOTE — Assessment & Plan Note (Signed)
May contribute to weight loss but is not leading casue in my opinion.  Dose duloxetine based on SCr.

## 2017-06-13 NOTE — Assessment & Plan Note (Signed)
Lab Results  Component Value Date   CREATININE 1.73 (H) 06/02/2017  Baseline 1.15 01/2017) Not taking Lasix Is taking duloxetine Recheck SCR and dose duloxetine appropriately

## 2017-06-13 NOTE — Patient Instructions (Signed)
I am so glad you have had the infection, treated. I believe it will take a few weeks to get over this illness and recover back to how you were before the illness.  We are checking your kidney function today.  Based on your kidney fucntion we will be able to dose your duloxetine correctly.   I am ordering a "Barium swallow esophagram" today to look for blockages in your esophagus that could casue you to lose weight.  We will also get a Chest Xray at the same time as the Barium study.  There were some crackles when Dr McDairmdi listened to the left side of your chest.  It is likely nothing, but he wants to check.

## 2017-06-13 NOTE — Assessment & Plan Note (Signed)
New problem Likely multifactorial with leading casue recent E  Coli urosepsis and acute dysenteric diarrhea.  Will monitor in two weeks. Will check Barium swallow esophagram to look for dysphagia in hisotyr of esophageal food impaction 08/2015.

## 2017-06-13 NOTE — Assessment & Plan Note (Signed)
New problem Repeat foot Xray on next office visit to look for evidence of healing.

## 2017-06-14 ENCOUNTER — Telehealth: Payer: Self-pay | Admitting: Family Medicine

## 2017-06-14 DIAGNOSIS — R0989 Other specified symptoms and signs involving the circulatory and respiratory systems: Secondary | ICD-10-CM

## 2017-06-14 LAB — RENAL FUNCTION PANEL
Albumin: 3.4 g/dL (ref 3.2–4.6)
BUN/Creatinine Ratio: 11 — ABNORMAL LOW (ref 12–28)
BUN: 16 mg/dL (ref 10–36)
CO2: 25 mmol/L (ref 20–29)
CREATININE: 1.43 mg/dL — AB (ref 0.57–1.00)
Calcium: 9.3 mg/dL (ref 8.7–10.3)
Chloride: 102 mmol/L (ref 96–106)
GFR, EST AFRICAN AMERICAN: 36 mL/min/{1.73_m2} — AB (ref >59)
GFR, EST NON AFRICAN AMERICAN: 31 mL/min/{1.73_m2} — AB (ref >59)
GLUCOSE: 90 mg/dL (ref 65–99)
PHOSPHORUS: 4.1 mg/dL (ref 2.5–4.5)
Potassium: 5.6 mmol/L — ABNORMAL HIGH (ref 3.5–5.2)
Sodium: 139 mmol/L (ref 134–144)

## 2017-06-14 NOTE — Telephone Encounter (Signed)
[name] made the appt for the swallow test.  She was told by [facility] they didn't have the order for the chest xray,  The swallow test is Thursday 2-14 at 11.  [name] would like to have them scheduled for the same day.  Please advise.  (She asked to speak to [facility]

## 2017-06-14 NOTE — Telephone Encounter (Signed)
Will forward to MD to place this order for chest xray.  [name],CMA

## 2017-06-15 ENCOUNTER — Ambulatory Visit
Admission: RE | Admit: 2017-06-15 | Discharge: 2017-06-15 | Disposition: A | Discharge status: Home / Self Care | Payer: Medicare Other | Source: Ambulatory Visit | Attending: Family Medicine | Admitting: Family Medicine

## 2017-06-15 ENCOUNTER — Encounter: Payer: Self-pay | Admitting: Family Medicine

## 2017-06-15 ENCOUNTER — Other Ambulatory Visit: Payer: Self-pay | Admitting: Family Medicine

## 2017-06-15 ENCOUNTER — Telehealth: Payer: Self-pay | Admitting: Family Medicine

## 2017-06-15 ENCOUNTER — Ambulatory Visit: Payer: Medicare Other | Admitting: Family Medicine

## 2017-06-15 DIAGNOSIS — G609 Hereditary and idiopathic neuropathy, unspecified: Secondary | ICD-10-CM

## 2017-06-15 DIAGNOSIS — M1A0210 Idiopathic chronic gout, right elbow, without tophus (tophi): Secondary | ICD-10-CM

## 2017-06-15 DIAGNOSIS — R634 Abnormal weight loss: Secondary | ICD-10-CM

## 2017-06-15 DIAGNOSIS — R0989 Other specified symptoms and signs involving the circulatory and respiratory systems: Secondary | ICD-10-CM

## 2017-06-15 DIAGNOSIS — M159 Polyosteoarthritis, unspecified: Secondary | ICD-10-CM

## 2017-06-15 DIAGNOSIS — R1310 Dysphagia, unspecified: Secondary | ICD-10-CM | POA: Diagnosis not present

## 2017-06-15 HISTORY — DX: Polyosteoarthritis, unspecified: M15.9

## 2017-06-15 MED ORDER — DULOXETINE HCL 30 MG PO CPEP: 30.0000 mg | ORAL_CAPSULE | Freq: Every day | ORAL | 3 refills | Status: DC

## 2017-06-15 NOTE — Telephone Encounter (Signed)
[name] called again. The order for the chest xray is still not at [facility]. She wants to have it done this morning after the swallow test

## 2017-06-15 NOTE — Telephone Encounter (Signed)
Order placed.  [name],CMA

## 2017-06-16 ENCOUNTER — Encounter: Payer: Self-pay | Admitting: Family Medicine

## 2017-06-16 ENCOUNTER — Telehealth: Payer: Self-pay | Admitting: *Deleted

## 2017-06-16 DIAGNOSIS — K228 Other specified diseases of esophagus: Secondary | ICD-10-CM | POA: Insufficient documentation

## 2017-06-16 DIAGNOSIS — N183 Chronic kidney disease, stage 3 (moderate): Secondary | ICD-10-CM | POA: Diagnosis not present

## 2017-06-16 DIAGNOSIS — K219 Gastro-esophageal reflux disease without esophagitis: Secondary | ICD-10-CM | POA: Diagnosis not present

## 2017-06-16 DIAGNOSIS — I5030 Unspecified diastolic (congestive) heart failure: Secondary | ICD-10-CM | POA: Diagnosis not present

## 2017-06-16 DIAGNOSIS — E785 Hyperlipidemia, unspecified: Secondary | ICD-10-CM | POA: Diagnosis not present

## 2017-06-16 DIAGNOSIS — I130 Hypertensive heart and chronic kidney disease with heart failure and stage 1 through stage 4 chronic kidney disease, or unspecified chronic kidney disease: Secondary | ICD-10-CM | POA: Diagnosis not present

## 2017-06-16 DIAGNOSIS — M1990 Unspecified osteoarthritis, unspecified site: Secondary | ICD-10-CM | POA: Diagnosis not present

## 2017-06-16 DIAGNOSIS — Z7901 Long term (current) use of anticoagulants: Secondary | ICD-10-CM | POA: Diagnosis not present

## 2017-06-16 DIAGNOSIS — M48061 Spinal stenosis, lumbar region without neurogenic claudication: Secondary | ICD-10-CM | POA: Diagnosis not present

## 2017-06-16 DIAGNOSIS — F329 Major depressive disorder, single episode, unspecified: Secondary | ICD-10-CM | POA: Diagnosis not present

## 2017-06-16 DIAGNOSIS — Z87440 Personal history of urinary (tract) infections: Secondary | ICD-10-CM | POA: Diagnosis not present

## 2017-06-16 DIAGNOSIS — G629 Polyneuropathy, unspecified: Secondary | ICD-10-CM | POA: Diagnosis not present

## 2017-06-16 DIAGNOSIS — M109 Gout, unspecified: Secondary | ICD-10-CM | POA: Diagnosis not present

## 2017-06-16 DIAGNOSIS — K2289 Other specified disease of esophagus: Secondary | ICD-10-CM

## 2017-06-16 DIAGNOSIS — S92352D Displaced fracture of fifth metatarsal bone, left foot, subsequent encounter for fracture with routine healing: Secondary | ICD-10-CM | POA: Diagnosis not present

## 2017-06-16 DIAGNOSIS — E039 Hypothyroidism, unspecified: Secondary | ICD-10-CM | POA: Diagnosis not present

## 2017-06-16 DIAGNOSIS — G309 Alzheimer's disease, unspecified: Secondary | ICD-10-CM | POA: Diagnosis not present

## 2017-06-16 DIAGNOSIS — Z950 Presence of cardiac pacemaker: Secondary | ICD-10-CM | POA: Diagnosis not present

## 2017-06-16 DIAGNOSIS — M810 Age-related osteoporosis without current pathological fracture: Secondary | ICD-10-CM | POA: Diagnosis not present

## 2017-06-16 DIAGNOSIS — F0280 Dementia in other diseases classified elsewhere without behavioral disturbance: Secondary | ICD-10-CM | POA: Diagnosis not present

## 2017-06-16 DIAGNOSIS — K222 Esophageal obstruction: Secondary | ICD-10-CM

## 2017-06-16 DIAGNOSIS — G894 Chronic pain syndrome: Secondary | ICD-10-CM | POA: Diagnosis not present

## 2017-06-16 HISTORY — DX: Other specified disease of esophagus: K22.89

## 2017-06-16 HISTORY — DX: Esophageal obstruction: K22.2

## 2017-06-16 NOTE — Telephone Encounter (Signed)
error 

## 2017-06-16 NOTE — Telephone Encounter (Signed)
Clementeen Hoof is requesting verbal orders for speech Therapy on pt. [name], [name], CMA

## 2017-06-16 NOTE — Telephone Encounter (Signed)
Please call order to patient's Home Health agency for Speech therapy for evaluation and treatment of patient's dysphagia from presbyesophagus and moderate distal esophagus narrowing.

## 2017-06-16 NOTE — Telephone Encounter (Signed)
Donetta informed of verbal order. [name],CMA

## 2017-06-19 ENCOUNTER — Telehealth: Payer: Self-pay | Admitting: Family Medicine

## 2017-06-19 ENCOUNTER — Telehealth: Payer: Self-pay

## 2017-06-19 DIAGNOSIS — S92352D Displaced fracture of fifth metatarsal bone, left foot, subsequent encounter for fracture with routine healing: Secondary | ICD-10-CM | POA: Diagnosis not present

## 2017-06-19 DIAGNOSIS — I5030 Unspecified diastolic (congestive) heart failure: Secondary | ICD-10-CM | POA: Diagnosis not present

## 2017-06-19 DIAGNOSIS — G309 Alzheimer's disease, unspecified: Secondary | ICD-10-CM | POA: Diagnosis not present

## 2017-06-19 DIAGNOSIS — N183 Chronic kidney disease, stage 3 (moderate): Secondary | ICD-10-CM | POA: Diagnosis not present

## 2017-06-19 DIAGNOSIS — F0280 Dementia in other diseases classified elsewhere without behavioral disturbance: Secondary | ICD-10-CM | POA: Diagnosis not present

## 2017-06-19 DIAGNOSIS — I130 Hypertensive heart and chronic kidney disease with heart failure and stage 1 through stage 4 chronic kidney disease, or unspecified chronic kidney disease: Secondary | ICD-10-CM | POA: Diagnosis not present

## 2017-06-19 NOTE — Telephone Encounter (Signed)
Please call in order for PT by [facility] for 2x/wk for 4 weeks.

## 2017-06-19 NOTE — Telephone Encounter (Signed)
Fax received this morning and placed in provider's box. Nyomie Ehrlich,CMA

## 2017-06-19 NOTE — Telephone Encounter (Signed)
Daughter is calling back to see if the doctor sent in the order for her mother's supplies. Please let her know right away. jw

## 2017-06-19 NOTE — Telephone Encounter (Signed)
Will forward to MD to see if a fax was received.  There are no appointments available right now.  Will check schedule again in a few days.  Manjot Hinks,CMA

## 2017-06-19 NOTE — Telephone Encounter (Signed)
Daughter is calling to see if the doctor received the fax about her mother's supplies? She also would like to know what date she will be able to bring her mother in. She was told that she needed to be seen in a couple of weeks. Please let her know and also remember to make the appointment as late as possible in the day.jw

## 2017-06-19 NOTE — Telephone Encounter (Signed)
Verbal order given on PT VM.

## 2017-06-19 NOTE — Telephone Encounter (Signed)
Fax placed in providers box for clarification on date of orders. [name],CMA

## 2017-06-19 NOTE — Telephone Encounter (Signed)
p 

## 2017-06-19 NOTE — Telephone Encounter (Signed)
Claiborne Billings, PT with [facility], needs verbal order for PT 2x/week x 4 weeks. Callback is [phone]. Danley Danker, RN [facility] RN)

## 2017-06-20 NOTE — Telephone Encounter (Signed)
Order for adult pads/diapers signed and fax'd

## 2017-06-20 NOTE — Telephone Encounter (Signed)
LM for daughter with appt info.  [name],CMA

## 2017-06-20 NOTE — Telephone Encounter (Signed)
Please put Jordan [name] in for an SDA late in morning of 07/04/17.   Please have me paged to see pt.

## 2017-06-21 DIAGNOSIS — S92352D Displaced fracture of fifth metatarsal bone, left foot, subsequent encounter for fracture with routine healing: Secondary | ICD-10-CM | POA: Diagnosis not present

## 2017-06-21 DIAGNOSIS — I130 Hypertensive heart and chronic kidney disease with heart failure and stage 1 through stage 4 chronic kidney disease, or unspecified chronic kidney disease: Secondary | ICD-10-CM | POA: Diagnosis not present

## 2017-06-21 DIAGNOSIS — G309 Alzheimer's disease, unspecified: Secondary | ICD-10-CM | POA: Diagnosis not present

## 2017-06-21 DIAGNOSIS — F0280 Dementia in other diseases classified elsewhere without behavioral disturbance: Secondary | ICD-10-CM | POA: Diagnosis not present

## 2017-06-21 DIAGNOSIS — I5030 Unspecified diastolic (congestive) heart failure: Secondary | ICD-10-CM | POA: Diagnosis not present

## 2017-06-21 DIAGNOSIS — N183 Chronic kidney disease, stage 3 (moderate): Secondary | ICD-10-CM | POA: Diagnosis not present

## 2017-06-22 DIAGNOSIS — G309 Alzheimer's disease, unspecified: Secondary | ICD-10-CM | POA: Diagnosis not present

## 2017-06-22 DIAGNOSIS — F0280 Dementia in other diseases classified elsewhere without behavioral disturbance: Secondary | ICD-10-CM | POA: Diagnosis not present

## 2017-06-22 DIAGNOSIS — I130 Hypertensive heart and chronic kidney disease with heart failure and stage 1 through stage 4 chronic kidney disease, or unspecified chronic kidney disease: Secondary | ICD-10-CM | POA: Diagnosis not present

## 2017-06-22 DIAGNOSIS — S92352D Displaced fracture of fifth metatarsal bone, left foot, subsequent encounter for fracture with routine healing: Secondary | ICD-10-CM | POA: Diagnosis not present

## 2017-06-22 DIAGNOSIS — I5030 Unspecified diastolic (congestive) heart failure: Secondary | ICD-10-CM | POA: Diagnosis not present

## 2017-06-22 DIAGNOSIS — N183 Chronic kidney disease, stage 3 (moderate): Secondary | ICD-10-CM | POA: Diagnosis not present

## 2017-06-23 ENCOUNTER — Other Ambulatory Visit: Payer: Self-pay

## 2017-06-23 DIAGNOSIS — I130 Hypertensive heart and chronic kidney disease with heart failure and stage 1 through stage 4 chronic kidney disease, or unspecified chronic kidney disease: Secondary | ICD-10-CM | POA: Diagnosis not present

## 2017-06-23 DIAGNOSIS — F0280 Dementia in other diseases classified elsewhere without behavioral disturbance: Secondary | ICD-10-CM | POA: Diagnosis not present

## 2017-06-23 DIAGNOSIS — S92352D Displaced fracture of fifth metatarsal bone, left foot, subsequent encounter for fracture with routine healing: Secondary | ICD-10-CM | POA: Diagnosis not present

## 2017-06-23 DIAGNOSIS — I5030 Unspecified diastolic (congestive) heart failure: Secondary | ICD-10-CM | POA: Diagnosis not present

## 2017-06-23 DIAGNOSIS — G309 Alzheimer's disease, unspecified: Secondary | ICD-10-CM | POA: Diagnosis not present

## 2017-06-23 DIAGNOSIS — N183 Chronic kidney disease, stage 3 (moderate): Secondary | ICD-10-CM | POA: Diagnosis not present

## 2017-06-27 DIAGNOSIS — I130 Hypertensive heart and chronic kidney disease with heart failure and stage 1 through stage 4 chronic kidney disease, or unspecified chronic kidney disease: Secondary | ICD-10-CM | POA: Diagnosis not present

## 2017-06-27 DIAGNOSIS — F0280 Dementia in other diseases classified elsewhere without behavioral disturbance: Secondary | ICD-10-CM | POA: Diagnosis not present

## 2017-06-27 DIAGNOSIS — I5030 Unspecified diastolic (congestive) heart failure: Secondary | ICD-10-CM | POA: Diagnosis not present

## 2017-06-27 DIAGNOSIS — G309 Alzheimer's disease, unspecified: Secondary | ICD-10-CM | POA: Diagnosis not present

## 2017-06-27 DIAGNOSIS — N183 Chronic kidney disease, stage 3 (moderate): Secondary | ICD-10-CM | POA: Diagnosis not present

## 2017-06-27 DIAGNOSIS — S92352D Displaced fracture of fifth metatarsal bone, left foot, subsequent encounter for fracture with routine healing: Secondary | ICD-10-CM | POA: Diagnosis not present

## 2017-06-27 MED ORDER — LIDOCAINE 5 % EX OINT: 1.0000 "application " | TOPICAL_OINTMENT | CUTANEOUS | 99 refills | Status: DC | PRN

## 2017-06-28 ENCOUNTER — Telehealth: Payer: Self-pay | Admitting: *Deleted

## 2017-06-28 DIAGNOSIS — F0280 Dementia in other diseases classified elsewhere without behavioral disturbance: Secondary | ICD-10-CM | POA: Diagnosis not present

## 2017-06-28 DIAGNOSIS — N183 Chronic kidney disease, stage 3 (moderate): Secondary | ICD-10-CM | POA: Diagnosis not present

## 2017-06-28 DIAGNOSIS — G309 Alzheimer's disease, unspecified: Secondary | ICD-10-CM | POA: Diagnosis not present

## 2017-06-28 DIAGNOSIS — I130 Hypertensive heart and chronic kidney disease with heart failure and stage 1 through stage 4 chronic kidney disease, or unspecified chronic kidney disease: Secondary | ICD-10-CM | POA: Diagnosis not present

## 2017-06-28 DIAGNOSIS — S92352D Displaced fracture of fifth metatarsal bone, left foot, subsequent encounter for fracture with routine healing: Secondary | ICD-10-CM | POA: Diagnosis not present

## 2017-06-28 DIAGNOSIS — I5030 Unspecified diastolic (congestive) heart failure: Secondary | ICD-10-CM | POA: Diagnosis not present

## 2017-06-28 NOTE — Telephone Encounter (Signed)
PA completed via covermymeds.  Approved  Talita Stamper Key: [id number]  PA Case ID: [id number] Approved Your request has been approved  Pharmacy informed. [name], [name], CMA

## 2017-06-28 NOTE — Telephone Encounter (Signed)
Reviewed

## 2017-06-29 DIAGNOSIS — I130 Hypertensive heart and chronic kidney disease with heart failure and stage 1 through stage 4 chronic kidney disease, or unspecified chronic kidney disease: Secondary | ICD-10-CM | POA: Diagnosis not present

## 2017-06-29 DIAGNOSIS — F0280 Dementia in other diseases classified elsewhere without behavioral disturbance: Secondary | ICD-10-CM | POA: Diagnosis not present

## 2017-06-29 DIAGNOSIS — S92352D Displaced fracture of fifth metatarsal bone, left foot, subsequent encounter for fracture with routine healing: Secondary | ICD-10-CM | POA: Diagnosis not present

## 2017-06-29 DIAGNOSIS — N183 Chronic kidney disease, stage 3 (moderate): Secondary | ICD-10-CM | POA: Diagnosis not present

## 2017-06-29 DIAGNOSIS — G309 Alzheimer's disease, unspecified: Secondary | ICD-10-CM | POA: Diagnosis not present

## 2017-06-29 DIAGNOSIS — I5030 Unspecified diastolic (congestive) heart failure: Secondary | ICD-10-CM | POA: Diagnosis not present

## 2017-06-30 ENCOUNTER — Other Ambulatory Visit: Payer: Self-pay | Admitting: Family Medicine

## 2017-06-30 NOTE — Progress Notes (Signed)
Received Prior Approval from [employer] ([place] for Lidocaine 5% oint coverage 05/02/17 thru 09/26/17.  Date of approval notification to physician 06/29/17.

## 2017-07-03 ENCOUNTER — Other Ambulatory Visit: Payer: Self-pay | Admitting: *Deleted

## 2017-07-03 DIAGNOSIS — G894 Chronic pain syndrome: Secondary | ICD-10-CM

## 2017-07-04 ENCOUNTER — Ambulatory Visit: Payer: Self-pay | Admitting: Internal Medicine

## 2017-07-04 DIAGNOSIS — I5030 Unspecified diastolic (congestive) heart failure: Secondary | ICD-10-CM | POA: Diagnosis not present

## 2017-07-04 DIAGNOSIS — F0280 Dementia in other diseases classified elsewhere without behavioral disturbance: Secondary | ICD-10-CM | POA: Diagnosis not present

## 2017-07-04 DIAGNOSIS — S92352D Displaced fracture of fifth metatarsal bone, left foot, subsequent encounter for fracture with routine healing: Secondary | ICD-10-CM | POA: Diagnosis not present

## 2017-07-04 DIAGNOSIS — I130 Hypertensive heart and chronic kidney disease with heart failure and stage 1 through stage 4 chronic kidney disease, or unspecified chronic kidney disease: Secondary | ICD-10-CM | POA: Diagnosis not present

## 2017-07-04 DIAGNOSIS — N183 Chronic kidney disease, stage 3 (moderate): Secondary | ICD-10-CM | POA: Diagnosis not present

## 2017-07-04 DIAGNOSIS — G309 Alzheimer's disease, unspecified: Secondary | ICD-10-CM | POA: Diagnosis not present

## 2017-07-04 MED ORDER — GABAPENTIN 100 MG PO CAPS: 200.0000 mg | ORAL_CAPSULE | Freq: Two times a day (BID) | ORAL | 5 refills | Status: DC

## 2017-07-06 DIAGNOSIS — I5030 Unspecified diastolic (congestive) heart failure: Secondary | ICD-10-CM | POA: Diagnosis not present

## 2017-07-06 DIAGNOSIS — F0280 Dementia in other diseases classified elsewhere without behavioral disturbance: Secondary | ICD-10-CM | POA: Diagnosis not present

## 2017-07-06 DIAGNOSIS — G309 Alzheimer's disease, unspecified: Secondary | ICD-10-CM | POA: Diagnosis not present

## 2017-07-06 DIAGNOSIS — N183 Chronic kidney disease, stage 3 (moderate): Secondary | ICD-10-CM | POA: Diagnosis not present

## 2017-07-06 DIAGNOSIS — I130 Hypertensive heart and chronic kidney disease with heart failure and stage 1 through stage 4 chronic kidney disease, or unspecified chronic kidney disease: Secondary | ICD-10-CM | POA: Diagnosis not present

## 2017-07-06 DIAGNOSIS — S92352D Displaced fracture of fifth metatarsal bone, left foot, subsequent encounter for fracture with routine healing: Secondary | ICD-10-CM | POA: Diagnosis not present

## 2017-07-07 DIAGNOSIS — I130 Hypertensive heart and chronic kidney disease with heart failure and stage 1 through stage 4 chronic kidney disease, or unspecified chronic kidney disease: Secondary | ICD-10-CM | POA: Diagnosis not present

## 2017-07-07 DIAGNOSIS — N183 Chronic kidney disease, stage 3 (moderate): Secondary | ICD-10-CM | POA: Diagnosis not present

## 2017-07-07 DIAGNOSIS — F0280 Dementia in other diseases classified elsewhere without behavioral disturbance: Secondary | ICD-10-CM | POA: Diagnosis not present

## 2017-07-07 DIAGNOSIS — G309 Alzheimer's disease, unspecified: Secondary | ICD-10-CM | POA: Diagnosis not present

## 2017-07-07 DIAGNOSIS — S92352D Displaced fracture of fifth metatarsal bone, left foot, subsequent encounter for fracture with routine healing: Secondary | ICD-10-CM | POA: Diagnosis not present

## 2017-07-07 DIAGNOSIS — I5030 Unspecified diastolic (congestive) heart failure: Secondary | ICD-10-CM | POA: Diagnosis not present

## 2017-07-10 ENCOUNTER — Other Ambulatory Visit: Payer: Self-pay | Admitting: *Deleted

## 2017-07-10 DIAGNOSIS — M5489 Other dorsalgia: Secondary | ICD-10-CM

## 2017-07-10 MED ORDER — LIDOCAINE 5 % EX OINT: 1.0000 "application " | TOPICAL_OINTMENT | CUTANEOUS | 99 refills | Status: DC | PRN

## 2017-07-10 MED ORDER — DICLOFENAC SODIUM 1 % TD GEL: 2.0000 g | Freq: Four times a day (QID) | TRANSDERMAL | 99 refills | Status: DC

## 2017-07-11 ENCOUNTER — Telehealth: Payer: Self-pay

## 2017-07-11 DIAGNOSIS — F0280 Dementia in other diseases classified elsewhere without behavioral disturbance: Secondary | ICD-10-CM | POA: Diagnosis not present

## 2017-07-11 DIAGNOSIS — N183 Chronic kidney disease, stage 3 (moderate): Secondary | ICD-10-CM | POA: Diagnosis not present

## 2017-07-11 DIAGNOSIS — I130 Hypertensive heart and chronic kidney disease with heart failure and stage 1 through stage 4 chronic kidney disease, or unspecified chronic kidney disease: Secondary | ICD-10-CM | POA: Diagnosis not present

## 2017-07-11 DIAGNOSIS — S92352D Displaced fracture of fifth metatarsal bone, left foot, subsequent encounter for fracture with routine healing: Secondary | ICD-10-CM | POA: Diagnosis not present

## 2017-07-11 DIAGNOSIS — G309 Alzheimer's disease, unspecified: Secondary | ICD-10-CM | POA: Diagnosis not present

## 2017-07-11 DIAGNOSIS — I5030 Unspecified diastolic (congestive) heart failure: Secondary | ICD-10-CM | POA: Diagnosis not present

## 2017-07-11 NOTE — Telephone Encounter (Signed)
[name]- RN with [facility] called. States pt is showing signs of UTI. Requesting verbal orders for pt to have UA and CNS. Call back [phone] Wallace Cullens, RN

## 2017-07-12 DIAGNOSIS — S92352D Displaced fracture of fifth metatarsal bone, left foot, subsequent encounter for fracture with routine healing: Secondary | ICD-10-CM | POA: Diagnosis not present

## 2017-07-12 DIAGNOSIS — F0280 Dementia in other diseases classified elsewhere without behavioral disturbance: Secondary | ICD-10-CM | POA: Diagnosis not present

## 2017-07-12 DIAGNOSIS — N183 Chronic kidney disease, stage 3 (moderate): Secondary | ICD-10-CM | POA: Diagnosis not present

## 2017-07-12 DIAGNOSIS — M109 Gout, unspecified: Secondary | ICD-10-CM | POA: Diagnosis not present

## 2017-07-12 DIAGNOSIS — G894 Chronic pain syndrome: Secondary | ICD-10-CM | POA: Diagnosis not present

## 2017-07-12 DIAGNOSIS — I130 Hypertensive heart and chronic kidney disease with heart failure and stage 1 through stage 4 chronic kidney disease, or unspecified chronic kidney disease: Secondary | ICD-10-CM | POA: Diagnosis not present

## 2017-07-12 DIAGNOSIS — G309 Alzheimer's disease, unspecified: Secondary | ICD-10-CM | POA: Diagnosis not present

## 2017-07-12 DIAGNOSIS — M810 Age-related osteoporosis without current pathological fracture: Secondary | ICD-10-CM | POA: Diagnosis not present

## 2017-07-12 DIAGNOSIS — I5030 Unspecified diastolic (congestive) heart failure: Secondary | ICD-10-CM | POA: Diagnosis not present

## 2017-07-12 NOTE — Telephone Encounter (Signed)
Jordan [name] is calling again to get verbal orders for a UA and  CNS. Pt needed these orders yesterday, but no one called her. The last time this patient had a UTI she ended up in the hospital for a couple of days and they are trying to avoid her having to go back for this. Jordan [name] really needs someone to call her back today to give her these orders. Call [place] at [phone]. Please advise

## 2017-07-12 NOTE — Telephone Encounter (Signed)
Spoke with [name] and verbal stat order given for urine sample.  Will forward to MD so he is aware. Qusay Villada,CMA

## 2017-07-12 NOTE — Telephone Encounter (Signed)
Tried calling [name] to give verbal order for UA but there was no answer and VM didn't have any identification.  Will continue to try and reach her. Logyn Kendrick,CMA

## 2017-07-13 ENCOUNTER — Telehealth: Payer: Self-pay | Admitting: Family Medicine

## 2017-07-13 DIAGNOSIS — I5030 Unspecified diastolic (congestive) heart failure: Secondary | ICD-10-CM | POA: Diagnosis not present

## 2017-07-13 DIAGNOSIS — F0280 Dementia in other diseases classified elsewhere without behavioral disturbance: Secondary | ICD-10-CM | POA: Diagnosis not present

## 2017-07-13 DIAGNOSIS — G309 Alzheimer's disease, unspecified: Secondary | ICD-10-CM | POA: Diagnosis not present

## 2017-07-13 DIAGNOSIS — N183 Chronic kidney disease, stage 3 (moderate): Secondary | ICD-10-CM | POA: Diagnosis not present

## 2017-07-13 DIAGNOSIS — S92352D Displaced fracture of fifth metatarsal bone, left foot, subsequent encounter for fracture with routine healing: Secondary | ICD-10-CM | POA: Diagnosis not present

## 2017-07-13 DIAGNOSIS — I130 Hypertensive heart and chronic kidney disease with heart failure and stage 1 through stage 4 chronic kidney disease, or unspecified chronic kidney disease: Secondary | ICD-10-CM | POA: Diagnosis not present

## 2017-07-13 MED ORDER — CIPROFLOXACIN HCL 250 MG PO TABS: 250.0000 mg | ORAL_TABLET | Freq: Two times a day (BID) | ORAL | 0 refills | Status: AC

## 2017-07-13 NOTE — Telephone Encounter (Signed)
Dr Erin Hearing is cross covering my patients for next two weeks.  Please let him be aware of urine and that patient required hospital admit last month for urosepsis.  Order to pt's home health for daily RN visits to assess pt status and vitals for the next 5 days for RN vitals check. If pt's systolic bp averages less than 100 mmHg or if pt' cognitive or physical function shows a decline, then pt should be evaluated at ED.  Tha nk you Sherren Mocha

## 2017-07-13 NOTE — Telephone Encounter (Addendum)
Spoke with daughter Ms Lahue is having smelly urine and cloudy Her blood pressure is up and down and does not have a temp Her appetite an oral intake is poor at baseline  UA appears infected Was on keflex in Feb after a pyelo Culture is pending Will treat with cipro  Daughter will pickup today

## 2017-07-13 NOTE — Telephone Encounter (Signed)
Please see phone note from 07-11-17. Earlie Arciga,CMA

## 2017-07-13 NOTE — Telephone Encounter (Signed)
Results received and placed in Dr. Erin Hearing' box for review.  Will forward to him and preceptor today as he wasn't in his office. Charmon Thorson,CMA

## 2017-07-13 NOTE — Addendum Note (Signed)
Addended by: Talbert Cage L on: 07/13/2017 03:14 PM   Modules accepted: Orders

## 2017-07-13 NOTE — Telephone Encounter (Signed)
[name] is asking about if the urine results are in.  Please advise

## 2017-07-13 NOTE — Telephone Encounter (Signed)
Tried [employer] but there was no answer or voicemail available.  Will continue to try and reach her. [name],CMA

## 2017-07-13 NOTE — Telephone Encounter (Signed)
Jordan [name] is asking about if the urine results are in.  Please advise .  Spoke with daughter and informed her that I tried to reach [place] but there was no answer or voicemail.  I understood from our talk yesterday that [name] was taking the urine sample to their lab and would let us know what the results were.  Apparently daughter was under the impression that we were resulting the urine and call them with results.  I advised [name] that I would call her once I had results and what provider wants to do about them.  Also to have [name] call me so I can give verbal nurse orders from PCP.  Daughter voiced understanding.  [name],CMA

## 2017-07-14 NOTE — Telephone Encounter (Signed)
Verbal orders given to [facility] regarding BP checks. Mindi Akerson,CMA

## 2017-07-14 NOTE — Telephone Encounter (Signed)
[name] with [facility] calling for [name]. Needs verbal orders. Call [place] at [phone]. Please advise

## 2017-07-18 DIAGNOSIS — G309 Alzheimer's disease, unspecified: Secondary | ICD-10-CM | POA: Diagnosis not present

## 2017-07-18 DIAGNOSIS — N183 Chronic kidney disease, stage 3 (moderate): Secondary | ICD-10-CM | POA: Diagnosis not present

## 2017-07-18 DIAGNOSIS — I130 Hypertensive heart and chronic kidney disease with heart failure and stage 1 through stage 4 chronic kidney disease, or unspecified chronic kidney disease: Secondary | ICD-10-CM | POA: Diagnosis not present

## 2017-07-18 DIAGNOSIS — I5030 Unspecified diastolic (congestive) heart failure: Secondary | ICD-10-CM | POA: Diagnosis not present

## 2017-07-18 DIAGNOSIS — F0280 Dementia in other diseases classified elsewhere without behavioral disturbance: Secondary | ICD-10-CM | POA: Diagnosis not present

## 2017-07-18 DIAGNOSIS — S92352D Displaced fracture of fifth metatarsal bone, left foot, subsequent encounter for fracture with routine healing: Secondary | ICD-10-CM | POA: Diagnosis not present

## 2017-07-20 DIAGNOSIS — F0280 Dementia in other diseases classified elsewhere without behavioral disturbance: Secondary | ICD-10-CM | POA: Diagnosis not present

## 2017-07-20 DIAGNOSIS — G309 Alzheimer's disease, unspecified: Secondary | ICD-10-CM | POA: Diagnosis not present

## 2017-07-20 DIAGNOSIS — I130 Hypertensive heart and chronic kidney disease with heart failure and stage 1 through stage 4 chronic kidney disease, or unspecified chronic kidney disease: Secondary | ICD-10-CM | POA: Diagnosis not present

## 2017-07-20 DIAGNOSIS — N183 Chronic kidney disease, stage 3 (moderate): Secondary | ICD-10-CM | POA: Diagnosis not present

## 2017-07-20 DIAGNOSIS — S92352D Displaced fracture of fifth metatarsal bone, left foot, subsequent encounter for fracture with routine healing: Secondary | ICD-10-CM | POA: Diagnosis not present

## 2017-07-20 DIAGNOSIS — I5030 Unspecified diastolic (congestive) heart failure: Secondary | ICD-10-CM | POA: Diagnosis not present

## 2017-07-28 DIAGNOSIS — I5030 Unspecified diastolic (congestive) heart failure: Secondary | ICD-10-CM | POA: Diagnosis not present

## 2017-07-28 DIAGNOSIS — N183 Chronic kidney disease, stage 3 (moderate): Secondary | ICD-10-CM | POA: Diagnosis not present

## 2017-07-28 DIAGNOSIS — I130 Hypertensive heart and chronic kidney disease with heart failure and stage 1 through stage 4 chronic kidney disease, or unspecified chronic kidney disease: Secondary | ICD-10-CM | POA: Diagnosis not present

## 2017-07-28 DIAGNOSIS — F0280 Dementia in other diseases classified elsewhere without behavioral disturbance: Secondary | ICD-10-CM | POA: Diagnosis not present

## 2017-07-28 DIAGNOSIS — S92352D Displaced fracture of fifth metatarsal bone, left foot, subsequent encounter for fracture with routine healing: Secondary | ICD-10-CM | POA: Diagnosis not present

## 2017-07-28 DIAGNOSIS — G309 Alzheimer's disease, unspecified: Secondary | ICD-10-CM | POA: Diagnosis not present

## 2017-08-02 DIAGNOSIS — I5030 Unspecified diastolic (congestive) heart failure: Secondary | ICD-10-CM | POA: Diagnosis not present

## 2017-08-02 DIAGNOSIS — G309 Alzheimer's disease, unspecified: Secondary | ICD-10-CM | POA: Diagnosis not present

## 2017-08-02 DIAGNOSIS — N183 Chronic kidney disease, stage 3 (moderate): Secondary | ICD-10-CM | POA: Diagnosis not present

## 2017-08-02 DIAGNOSIS — F0280 Dementia in other diseases classified elsewhere without behavioral disturbance: Secondary | ICD-10-CM | POA: Diagnosis not present

## 2017-08-02 DIAGNOSIS — S92352D Displaced fracture of fifth metatarsal bone, left foot, subsequent encounter for fracture with routine healing: Secondary | ICD-10-CM | POA: Diagnosis not present

## 2017-08-02 DIAGNOSIS — I130 Hypertensive heart and chronic kidney disease with heart failure and stage 1 through stage 4 chronic kidney disease, or unspecified chronic kidney disease: Secondary | ICD-10-CM | POA: Diagnosis not present

## 2017-08-03 ENCOUNTER — Telehealth: Payer: Self-pay | Admitting: Family Medicine

## 2017-08-03 DIAGNOSIS — M1A0210 Idiopathic chronic gout, right elbow, without tophus (tophi): Secondary | ICD-10-CM

## 2017-08-03 NOTE — Telephone Encounter (Signed)
[name]- RN with [facility] calling stating pt is experiencing gout flare up, already taking colchicine and allopurinol. Questioning what else can be done for patient..  Her call back [phone] Wallace Cullens, RN

## 2017-08-03 NOTE — Telephone Encounter (Signed)
Will forward to MD to advise. Stepheni Cameron,CMA  

## 2017-08-03 NOTE — Telephone Encounter (Signed)
Jordan [name] called because her mother is having a flare up of gout in her elbow. The nurse from the nursing home looked at this and said she was pretty sure it was also gout. Jordan [name] was hoping that Dr. McDiarmid would call in prednisone like he has done in the past. Please call with questions. jw

## 2017-08-04 MED ORDER — PREDNISONE 10 MG PO TABS: 30.0000 mg | ORAL_TABLET | Freq: Every day | ORAL | 1 refills | Status: AC

## 2017-08-04 NOTE — Telephone Encounter (Signed)
Please notify patient's that prescription for prednisone was sent into their pharmacy.   If symptoms do not start to improve after 2 days of the prednisone, Jordan [name] will need to be seen at the clinic.   If symptoms improve, but return soon after finishing the 5 days of prednisone, then refill predniosne and complete anther 5 days of prednisone therapy.

## 2017-08-04 NOTE — Telephone Encounter (Signed)
Daughter informed and will plan to see provider on Thursday for their regularly scheduled appt. [name],CMA

## 2017-08-05 ENCOUNTER — Other Ambulatory Visit: Payer: Self-pay | Admitting: Family Medicine

## 2017-08-05 DIAGNOSIS — G609 Hereditary and idiopathic neuropathy, unspecified: Secondary | ICD-10-CM

## 2017-08-10 ENCOUNTER — Ambulatory Visit (INDEPENDENT_AMBULATORY_CARE_PROVIDER_SITE_OTHER): Payer: Medicare Other | Admitting: Family Medicine

## 2017-08-10 ENCOUNTER — Other Ambulatory Visit: Payer: Self-pay

## 2017-08-10 ENCOUNTER — Encounter: Payer: Self-pay | Admitting: Family Medicine

## 2017-08-10 VITALS — BP 118/64 | HR 64 | Temp 98.7°F | Ht 62.0 in | Wt 123.0 lb

## 2017-08-10 DIAGNOSIS — M1612 Unilateral primary osteoarthritis, left hip: Secondary | ICD-10-CM | POA: Diagnosis not present

## 2017-08-10 DIAGNOSIS — F0280 Dementia in other diseases classified elsewhere without behavioral disturbance: Secondary | ICD-10-CM

## 2017-08-10 DIAGNOSIS — M8088XS Other osteoporosis with current pathological fracture, vertebra(e), sequela: Secondary | ICD-10-CM | POA: Diagnosis not present

## 2017-08-10 DIAGNOSIS — G301 Alzheimer's disease with late onset: Secondary | ICD-10-CM

## 2017-08-10 DIAGNOSIS — I5032 Chronic diastolic (congestive) heart failure: Secondary | ICD-10-CM | POA: Diagnosis not present

## 2017-08-10 DIAGNOSIS — R54 Age-related physical debility: Secondary | ICD-10-CM

## 2017-08-10 DIAGNOSIS — M1A0210 Idiopathic chronic gout, right elbow, without tophus (tophi): Secondary | ICD-10-CM

## 2017-08-10 DIAGNOSIS — M159 Polyosteoarthritis, unspecified: Secondary | ICD-10-CM | POA: Diagnosis not present

## 2017-08-10 DIAGNOSIS — R634 Abnormal weight loss: Secondary | ICD-10-CM

## 2017-08-10 DIAGNOSIS — I10 Essential (primary) hypertension: Secondary | ICD-10-CM | POA: Diagnosis not present

## 2017-08-10 NOTE — Patient Instructions (Signed)
It wwas a pleasure seeing you again, Jordan [name].  If your daughter needs more medicine for your gout, she only needs to call.  I have requested that the Home Health Nurse continue coming out to see you at your home.

## 2017-08-11 ENCOUNTER — Encounter: Payer: Self-pay | Admitting: Family Medicine

## 2017-08-11 ENCOUNTER — Telehealth: Payer: Self-pay

## 2017-08-11 DIAGNOSIS — S92352D Displaced fracture of fifth metatarsal bone, left foot, subsequent encounter for fracture with routine healing: Secondary | ICD-10-CM | POA: Diagnosis not present

## 2017-08-11 DIAGNOSIS — F0280 Dementia in other diseases classified elsewhere without behavioral disturbance: Secondary | ICD-10-CM | POA: Diagnosis not present

## 2017-08-11 DIAGNOSIS — N183 Chronic kidney disease, stage 3 (moderate): Secondary | ICD-10-CM | POA: Diagnosis not present

## 2017-08-11 DIAGNOSIS — I130 Hypertensive heart and chronic kidney disease with heart failure and stage 1 through stage 4 chronic kidney disease, or unspecified chronic kidney disease: Secondary | ICD-10-CM | POA: Diagnosis not present

## 2017-08-11 DIAGNOSIS — I5030 Unspecified diastolic (congestive) heart failure: Secondary | ICD-10-CM | POA: Diagnosis not present

## 2017-08-11 DIAGNOSIS — G309 Alzheimer's disease, unspecified: Secondary | ICD-10-CM | POA: Diagnosis not present

## 2017-08-11 NOTE — Assessment & Plan Note (Signed)
Established problem that has improved.  Ponder if this could be pseudogout in right elbow rather than gout attacks Continue on colchicine daily and allopurinol

## 2017-08-11 NOTE — Telephone Encounter (Signed)
Donita, RN with [facility], requests verbal orders:  Continue home health nursing x 4-8 more weeks, and obtain urine culture if patient has s/s of UTI.  Call back is [phone]  Danley Danker, RN [facility])

## 2017-08-11 NOTE — Assessment & Plan Note (Signed)
Relatively stable memory per dgt.  FAST Stage: Six memory loss, slow information processing, difficulty with abstract concepts and difficulty with complex reasoning decreased attention Palliative Performance Score: 50%.  Behavioral and Psychological Symptoms of Dementia: none Nonpharmacologic treatments of patient: none Pharmacologic treatments of patient: none Supportive Interventions for caregiver (support group or individual counseling): not needed at this time  Physical Activity recommendation: Try to walk with walker about house for chronic back pain Long-term care planning: reside at home with family  Follow up surveillance appointment in 3 to 6 months: 3 month

## 2017-08-11 NOTE — Assessment & Plan Note (Signed)
Established problem. Stable. Continue current therapy APAP prn Oxycodone 5 mg tablet, 0.5 to 1 tablet every 6 hr as needed for uncontrolled pain.

## 2017-08-11 NOTE — Progress Notes (Signed)
   Subjective:    Patient ID: Jordan [name], female    DOB: 04-21-1920, 84 y.o.   MRN: [id number] Jordan [name] is accompanied by son Sources of clinical information for visit is/are patient, relative(s) and past medical records. son insisted on translating Nursing assessment for this office visit was reviewed with the patient for accuracy and revision.  Depression screen PHQ 2/9 06/13/2017  Decreased Interest 0  Down, Depressed, Hopeless 0  PHQ - 2 Score 0  Altered sleeping -  Tired, decreased energy -  Change in appetite -  Feeling bad or failure about yourself  -  Trouble concentrating -  Moving slowly or fidgety/restless -  Suicidal thoughts -  PHQ-9 Score -  Some recent data might be hidden   Fall Risk  06/13/2017 04/06/2017 02/13/2017 01/19/2017 09/01/2016  Falls in the past year? Yes No No No Yes  Number falls in past yr: 2 or more - - - 1  Comment - - - - -  Injury with Fall? Yes - - - No  Comment - - - - -  Risk Factor Category  High Fall Risk - - - -  Risk for fall due to : - - - - -  Risk for fall due to: Comment - - - - -  Follow up - - - - -    HPI   BACK PAIN  Location: bilateral lower back and buttocks  Quality: aching  Onset: years ago Worse with: movement  Better with: rest, apap, nsaid  Radiation: buttocks  Trauma: no Best sitting/standing/leaning forward: no change  Red Flags Fecal/urinary incontinence: no  Numbness/Weakness: no  Fever/chills/sweats: no  Night pain: no  Unexplained weight loss: no  No relief with bedrest: yes  h/o cancer/immunosuppression: no  IV drug use: no  PMH of osteoporosis or chronic steroid use: yes, hx vertebral collapse, lumbar spinal stenosis, chronic pain syndrome  SH: no smoking  Review of Systems See HPI    Objective:   Physical Exam VS reviewed GEN: Alert, Cooperative, Groomed, NAD, , patient seated in wheel chair HEENT: PERRL; EAC bilaterally not occluded, TM's translucent with normal LM COR: RRR, sternal border  systolic murmur with rad into left carotid, No JVD,  LUNGS: BCTA, No Acc mm use, speaking in full sentences ABDOMEN: (+)BS, soft, NT, N EXT: No peripheral leg edema. No pain with internal and external rotation of hips BACK: TTP Bilateral SI angles and upper buttocks bilaterally R>L SKIN: No lesion nor rashes of  back Neuro: 5/5 ankle PF/DF Psych: affect warm/thought-purposeful//language concrete    Assessment & Plan:  See problem list 30 minutes face to face where spent in total with counseling / coordination of care took more than 50% of the total time. Counseling involved discussion differential diagnosis of back pain, testing, prognosis for back pain, adherence, adverse effects of opiates, benefits of opiate treatment, instructions, compliance importance.

## 2017-08-11 NOTE — Assessment & Plan Note (Signed)
Established problem Controlled Continue current therapy regiment. Will consult [place] RN to monitor vitals and overall condition for deterioration.

## 2017-08-11 NOTE — Assessment & Plan Note (Signed)
Wt Readings from Last 3 Encounters:  08/10/17 123 lb (55.8 kg)  06/13/17 125 lb (56.7 kg)  06/07/17 126 lb (57.2 kg)  Relatively stable  Monitor

## 2017-08-11 NOTE — Assessment & Plan Note (Signed)
Established problem. Stable. Continue current therapy  

## 2017-08-14 NOTE — Telephone Encounter (Signed)
Please call in verbal orders to [facility] RN  [place] RN x 4 to 8 weeks Obtain urine if patient has sign or symptoms of UTI

## 2017-08-14 NOTE — Telephone Encounter (Signed)
Verbal order given to Donita.  [name],CMA

## 2017-08-15 DIAGNOSIS — N183 Chronic kidney disease, stage 3 (moderate): Secondary | ICD-10-CM | POA: Diagnosis not present

## 2017-08-15 DIAGNOSIS — E039 Hypothyroidism, unspecified: Secondary | ICD-10-CM | POA: Diagnosis not present

## 2017-08-15 DIAGNOSIS — G629 Polyneuropathy, unspecified: Secondary | ICD-10-CM | POA: Diagnosis not present

## 2017-08-15 DIAGNOSIS — G309 Alzheimer's disease, unspecified: Secondary | ICD-10-CM | POA: Diagnosis not present

## 2017-08-15 DIAGNOSIS — E785 Hyperlipidemia, unspecified: Secondary | ICD-10-CM | POA: Diagnosis not present

## 2017-08-15 DIAGNOSIS — M810 Age-related osteoporosis without current pathological fracture: Secondary | ICD-10-CM | POA: Diagnosis not present

## 2017-08-15 DIAGNOSIS — G894 Chronic pain syndrome: Secondary | ICD-10-CM | POA: Diagnosis not present

## 2017-08-15 DIAGNOSIS — I5030 Unspecified diastolic (congestive) heart failure: Secondary | ICD-10-CM | POA: Diagnosis not present

## 2017-08-15 DIAGNOSIS — I130 Hypertensive heart and chronic kidney disease with heart failure and stage 1 through stage 4 chronic kidney disease, or unspecified chronic kidney disease: Secondary | ICD-10-CM | POA: Diagnosis not present

## 2017-08-15 DIAGNOSIS — Z7901 Long term (current) use of anticoagulants: Secondary | ICD-10-CM | POA: Diagnosis not present

## 2017-08-15 DIAGNOSIS — Z87440 Personal history of urinary (tract) infections: Secondary | ICD-10-CM | POA: Diagnosis not present

## 2017-08-15 DIAGNOSIS — Z950 Presence of cardiac pacemaker: Secondary | ICD-10-CM | POA: Diagnosis not present

## 2017-08-15 DIAGNOSIS — M1990 Unspecified osteoarthritis, unspecified site: Secondary | ICD-10-CM | POA: Diagnosis not present

## 2017-08-15 DIAGNOSIS — F329 Major depressive disorder, single episode, unspecified: Secondary | ICD-10-CM | POA: Diagnosis not present

## 2017-08-15 DIAGNOSIS — F0280 Dementia in other diseases classified elsewhere without behavioral disturbance: Secondary | ICD-10-CM | POA: Diagnosis not present

## 2017-08-15 DIAGNOSIS — K219 Gastro-esophageal reflux disease without esophagitis: Secondary | ICD-10-CM | POA: Diagnosis not present

## 2017-08-15 DIAGNOSIS — M109 Gout, unspecified: Secondary | ICD-10-CM | POA: Diagnosis not present

## 2017-08-15 DIAGNOSIS — S92352D Displaced fracture of fifth metatarsal bone, left foot, subsequent encounter for fracture with routine healing: Secondary | ICD-10-CM | POA: Diagnosis not present

## 2017-08-15 DIAGNOSIS — M48061 Spinal stenosis, lumbar region without neurogenic claudication: Secondary | ICD-10-CM | POA: Diagnosis not present

## 2017-08-18 DIAGNOSIS — G309 Alzheimer's disease, unspecified: Secondary | ICD-10-CM | POA: Diagnosis not present

## 2017-08-18 DIAGNOSIS — N183 Chronic kidney disease, stage 3 (moderate): Secondary | ICD-10-CM | POA: Diagnosis not present

## 2017-08-18 DIAGNOSIS — I130 Hypertensive heart and chronic kidney disease with heart failure and stage 1 through stage 4 chronic kidney disease, or unspecified chronic kidney disease: Secondary | ICD-10-CM | POA: Diagnosis not present

## 2017-08-18 DIAGNOSIS — I5030 Unspecified diastolic (congestive) heart failure: Secondary | ICD-10-CM | POA: Diagnosis not present

## 2017-08-18 DIAGNOSIS — S92352D Displaced fracture of fifth metatarsal bone, left foot, subsequent encounter for fracture with routine healing: Secondary | ICD-10-CM | POA: Diagnosis not present

## 2017-08-18 DIAGNOSIS — F0280 Dementia in other diseases classified elsewhere without behavioral disturbance: Secondary | ICD-10-CM | POA: Diagnosis not present

## 2017-08-23 DIAGNOSIS — F0280 Dementia in other diseases classified elsewhere without behavioral disturbance: Secondary | ICD-10-CM | POA: Diagnosis not present

## 2017-08-23 DIAGNOSIS — S92352D Displaced fracture of fifth metatarsal bone, left foot, subsequent encounter for fracture with routine healing: Secondary | ICD-10-CM | POA: Diagnosis not present

## 2017-08-23 DIAGNOSIS — N183 Chronic kidney disease, stage 3 (moderate): Secondary | ICD-10-CM | POA: Diagnosis not present

## 2017-08-23 DIAGNOSIS — G309 Alzheimer's disease, unspecified: Secondary | ICD-10-CM | POA: Diagnosis not present

## 2017-08-23 DIAGNOSIS — I5030 Unspecified diastolic (congestive) heart failure: Secondary | ICD-10-CM | POA: Diagnosis not present

## 2017-08-23 DIAGNOSIS — I130 Hypertensive heart and chronic kidney disease with heart failure and stage 1 through stage 4 chronic kidney disease, or unspecified chronic kidney disease: Secondary | ICD-10-CM | POA: Diagnosis not present

## 2017-08-30 ENCOUNTER — Other Ambulatory Visit: Payer: Self-pay | Admitting: Family Medicine

## 2017-08-30 DIAGNOSIS — I442 Atrioventricular block, complete: Secondary | ICD-10-CM | POA: Insufficient documentation

## 2017-08-31 DIAGNOSIS — I480 Paroxysmal atrial fibrillation: Secondary | ICD-10-CM | POA: Diagnosis not present

## 2017-08-31 DIAGNOSIS — Z950 Presence of cardiac pacemaker: Secondary | ICD-10-CM | POA: Diagnosis not present

## 2017-08-31 DIAGNOSIS — Z45018 Encounter for adjustment and management of other part of cardiac pacemaker: Secondary | ICD-10-CM | POA: Diagnosis not present

## 2017-08-31 DIAGNOSIS — Z8679 Personal history of other diseases of the circulatory system: Secondary | ICD-10-CM | POA: Diagnosis not present

## 2017-09-06 DIAGNOSIS — F0280 Dementia in other diseases classified elsewhere without behavioral disturbance: Secondary | ICD-10-CM | POA: Diagnosis not present

## 2017-09-06 DIAGNOSIS — G309 Alzheimer's disease, unspecified: Secondary | ICD-10-CM | POA: Diagnosis not present

## 2017-09-06 DIAGNOSIS — I130 Hypertensive heart and chronic kidney disease with heart failure and stage 1 through stage 4 chronic kidney disease, or unspecified chronic kidney disease: Secondary | ICD-10-CM | POA: Diagnosis not present

## 2017-09-06 DIAGNOSIS — N183 Chronic kidney disease, stage 3 (moderate): Secondary | ICD-10-CM | POA: Diagnosis not present

## 2017-09-06 DIAGNOSIS — I5030 Unspecified diastolic (congestive) heart failure: Secondary | ICD-10-CM | POA: Diagnosis not present

## 2017-09-06 DIAGNOSIS — S92352D Displaced fracture of fifth metatarsal bone, left foot, subsequent encounter for fracture with routine healing: Secondary | ICD-10-CM | POA: Diagnosis not present

## 2017-09-13 DIAGNOSIS — G309 Alzheimer's disease, unspecified: Secondary | ICD-10-CM | POA: Diagnosis not present

## 2017-09-13 DIAGNOSIS — N183 Chronic kidney disease, stage 3 (moderate): Secondary | ICD-10-CM | POA: Diagnosis not present

## 2017-09-13 DIAGNOSIS — I5030 Unspecified diastolic (congestive) heart failure: Secondary | ICD-10-CM | POA: Diagnosis not present

## 2017-09-13 DIAGNOSIS — I130 Hypertensive heart and chronic kidney disease with heart failure and stage 1 through stage 4 chronic kidney disease, or unspecified chronic kidney disease: Secondary | ICD-10-CM | POA: Diagnosis not present

## 2017-09-13 DIAGNOSIS — S92352D Displaced fracture of fifth metatarsal bone, left foot, subsequent encounter for fracture with routine healing: Secondary | ICD-10-CM | POA: Diagnosis not present

## 2017-09-13 DIAGNOSIS — F0280 Dementia in other diseases classified elsewhere without behavioral disturbance: Secondary | ICD-10-CM | POA: Diagnosis not present

## 2017-09-21 ENCOUNTER — Telehealth: Payer: Self-pay

## 2017-09-21 DIAGNOSIS — S92352D Displaced fracture of fifth metatarsal bone, left foot, subsequent encounter for fracture with routine healing: Secondary | ICD-10-CM | POA: Diagnosis not present

## 2017-09-21 DIAGNOSIS — G894 Chronic pain syndrome: Secondary | ICD-10-CM | POA: Diagnosis not present

## 2017-09-21 DIAGNOSIS — F0280 Dementia in other diseases classified elsewhere without behavioral disturbance: Secondary | ICD-10-CM | POA: Diagnosis not present

## 2017-09-21 DIAGNOSIS — N183 Chronic kidney disease, stage 3 (moderate): Secondary | ICD-10-CM | POA: Diagnosis not present

## 2017-09-21 DIAGNOSIS — Z8701 Personal history of pneumonia (recurrent): Secondary | ICD-10-CM | POA: Diagnosis not present

## 2017-09-21 DIAGNOSIS — G309 Alzheimer's disease, unspecified: Secondary | ICD-10-CM | POA: Diagnosis not present

## 2017-09-21 DIAGNOSIS — Z87440 Personal history of urinary (tract) infections: Secondary | ICD-10-CM | POA: Diagnosis not present

## 2017-09-21 DIAGNOSIS — G629 Polyneuropathy, unspecified: Secondary | ICD-10-CM | POA: Diagnosis not present

## 2017-09-21 DIAGNOSIS — M109 Gout, unspecified: Secondary | ICD-10-CM | POA: Diagnosis not present

## 2017-09-21 DIAGNOSIS — I130 Hypertensive heart and chronic kidney disease with heart failure and stage 1 through stage 4 chronic kidney disease, or unspecified chronic kidney disease: Secondary | ICD-10-CM | POA: Diagnosis not present

## 2017-09-21 DIAGNOSIS — I5030 Unspecified diastolic (congestive) heart failure: Secondary | ICD-10-CM | POA: Diagnosis not present

## 2017-09-21 NOTE — Telephone Encounter (Signed)
Donita, RN with [facility] called with following report:  Patient has urinary frequency so they are getting a urine culture and results will be sent here.  Patient has rhonchi present in all areas of lungs, last assessment lungs were clear. Complains of some tiredness, weakness. BP 128/48, HR 64 and regular, RR 16 non-labored.  Would you like them to to a portable CXR or schedule an office visit?  Donita's call back is [phone].  Danley Danker, RN [facility] RN)

## 2017-09-22 ENCOUNTER — Telehealth: Payer: Self-pay | Admitting: Internal Medicine

## 2017-09-22 DIAGNOSIS — R0989 Other specified symptoms and signs involving the circulatory and respiratory systems: Secondary | ICD-10-CM | POA: Diagnosis not present

## 2017-09-22 MED ORDER — LEVOFLOXACIN 750 MG PO TABS: 750.0000 mg | ORAL_TABLET | Freq: Every day | ORAL | 0 refills | Status: DC

## 2017-09-22 NOTE — Telephone Encounter (Signed)
Verbal orders given to [facility] for 2 view chest xray and vitals.  Daviona Herbert,CMA

## 2017-09-22 NOTE — Telephone Encounter (Signed)
If able to obtain a portable CXR then order 2 View diagnostic Chest Xray, if possible.   If unable to obtain 2-view CXR, then obtain 1-view posterior-anterior view.   Please request vitals and pulse ox report from next 3 RN visits.

## 2017-09-22 NOTE — Telephone Encounter (Signed)
Donita from [facility] called regarding chest xray result. Report read as showing mild bibasilar pulmonary infiltrates, R > L, concerning for pneumonia. She had checked on patient yesterday, 5/23, and noted crackles "all over." She alerted patient's PCP who ordered chest xray. Patient's daughter notes her mother has been more tired over the last 1.5 weeks. She reports some increased shortness of breath but does not exert herself much at baseline. Not much of a cough. Urine sample apparently dropped off at [facility] today, as patient is prone to urinary tract infections. Patient received antibiotics in February, so recommended levaquin 750 mg x 5 days to treat presumptive CAP. This would also hopefully provide UTI coverage. Appointment made with Dr. Wendy Poet 09/28/17 with possibility to cancel if patient improved (will message PCP if this would be appropriate, as she has RN home visits). Gave return precautions of increased confusion, inability to tolerate PO, respiratory distress. Daughter expressed understanding.   Olene Floss, MD [place], PGY-3

## 2017-09-26 ENCOUNTER — Telehealth: Payer: Self-pay

## 2017-09-26 DIAGNOSIS — I130 Hypertensive heart and chronic kidney disease with heart failure and stage 1 through stage 4 chronic kidney disease, or unspecified chronic kidney disease: Secondary | ICD-10-CM | POA: Diagnosis not present

## 2017-09-26 DIAGNOSIS — N183 Chronic kidney disease, stage 3 (moderate): Secondary | ICD-10-CM | POA: Diagnosis not present

## 2017-09-26 DIAGNOSIS — I5030 Unspecified diastolic (congestive) heart failure: Secondary | ICD-10-CM | POA: Diagnosis not present

## 2017-09-26 DIAGNOSIS — F0280 Dementia in other diseases classified elsewhere without behavioral disturbance: Secondary | ICD-10-CM | POA: Diagnosis not present

## 2017-09-26 DIAGNOSIS — S92352D Displaced fracture of fifth metatarsal bone, left foot, subsequent encounter for fracture with routine healing: Secondary | ICD-10-CM | POA: Diagnosis not present

## 2017-09-26 DIAGNOSIS — G309 Alzheimer's disease, unspecified: Secondary | ICD-10-CM | POA: Diagnosis not present

## 2017-09-26 NOTE — Telephone Encounter (Signed)
Donetta- RN with [facility] calling with Pt update. Todays SPO2 97%, Temp: 97.4, HR: 66 and regular, RR: 17, BP: 130/52. Pt lungs sounding much better, still notes a rhonchi in lower lobes. Would like to know if pt needs to keep her appt here this week? Call back [phone] Wallace Cullens, RN

## 2017-09-26 NOTE — Telephone Encounter (Signed)
Reviewed and agree with plan.

## 2017-09-27 NOTE — Telephone Encounter (Signed)
Pt daughter called concerning the conversation below. Daughter never heard back about this. I read the message off to her and she is going to keep the appt for now. Pt daughter would like McDiarmid to call her and discuss the pt's recent x-ray. Please advise

## 2017-09-27 NOTE — Telephone Encounter (Signed)
If Jordan [name] is following up just for this illness, than it may be less stress on her to not follow up with [name] this week.

## 2017-09-27 NOTE — Telephone Encounter (Signed)
Pt is coming in tomorrow to see you, but if you have time to call patients daughter beforehand to discuss xray, please do so. Thanks!

## 2017-09-28 ENCOUNTER — Ambulatory Visit: Payer: Self-pay | Admitting: Family Medicine

## 2017-09-29 DIAGNOSIS — G309 Alzheimer's disease, unspecified: Secondary | ICD-10-CM | POA: Diagnosis not present

## 2017-09-29 DIAGNOSIS — N183 Chronic kidney disease, stage 3 (moderate): Secondary | ICD-10-CM | POA: Diagnosis not present

## 2017-09-29 DIAGNOSIS — I5030 Unspecified diastolic (congestive) heart failure: Secondary | ICD-10-CM | POA: Diagnosis not present

## 2017-09-29 DIAGNOSIS — I130 Hypertensive heart and chronic kidney disease with heart failure and stage 1 through stage 4 chronic kidney disease, or unspecified chronic kidney disease: Secondary | ICD-10-CM | POA: Diagnosis not present

## 2017-09-29 DIAGNOSIS — F0280 Dementia in other diseases classified elsewhere without behavioral disturbance: Secondary | ICD-10-CM | POA: Diagnosis not present

## 2017-09-29 DIAGNOSIS — S92352D Displaced fracture of fifth metatarsal bone, left foot, subsequent encounter for fracture with routine healing: Secondary | ICD-10-CM | POA: Diagnosis not present

## 2017-10-02 ENCOUNTER — Ambulatory Visit
Admission: RE | Admit: 2017-10-02 | Discharge: 2017-10-02 | Disposition: A | Discharge status: Home / Self Care | Payer: Medicare Other | Source: Ambulatory Visit | Attending: Family Medicine | Admitting: Family Medicine

## 2017-10-02 ENCOUNTER — Other Ambulatory Visit: Payer: Self-pay | Admitting: Family Medicine

## 2017-10-02 ENCOUNTER — Other Ambulatory Visit: Payer: Self-pay

## 2017-10-02 ENCOUNTER — Ambulatory Visit (INDEPENDENT_AMBULATORY_CARE_PROVIDER_SITE_OTHER): Payer: Medicare Other | Admitting: Internal Medicine

## 2017-10-02 ENCOUNTER — Telehealth: Payer: Self-pay | Admitting: Family Medicine

## 2017-10-02 ENCOUNTER — Encounter: Payer: Self-pay | Admitting: Internal Medicine

## 2017-10-02 VITALS — BP 102/38 | HR 70 | Temp 97.6°F | Ht 62.0 in | Wt 121.8 lb

## 2017-10-02 DIAGNOSIS — J984 Other disorders of lung: Secondary | ICD-10-CM | POA: Diagnosis not present

## 2017-10-02 DIAGNOSIS — I959 Hypotension, unspecified: Secondary | ICD-10-CM

## 2017-10-02 DIAGNOSIS — R0609 Other forms of dyspnea: Secondary | ICD-10-CM

## 2017-10-02 DIAGNOSIS — T148XXA Other injury of unspecified body region, initial encounter: Secondary | ICD-10-CM

## 2017-10-02 DIAGNOSIS — R0600 Dyspnea, unspecified: Secondary | ICD-10-CM

## 2017-10-02 DIAGNOSIS — Z8679 Personal history of other diseases of the circulatory system: Secondary | ICD-10-CM

## 2017-10-02 DIAGNOSIS — J181 Lobar pneumonia, unspecified organism: Principal | ICD-10-CM

## 2017-10-02 DIAGNOSIS — R0989 Other specified symptoms and signs involving the circulatory and respiratory systems: Secondary | ICD-10-CM

## 2017-10-02 DIAGNOSIS — J479 Bronchiectasis, uncomplicated: Secondary | ICD-10-CM

## 2017-10-02 DIAGNOSIS — I480 Paroxysmal atrial fibrillation: Secondary | ICD-10-CM | POA: Insufficient documentation

## 2017-10-02 DIAGNOSIS — J189 Pneumonia, unspecified organism: Secondary | ICD-10-CM

## 2017-10-02 NOTE — Telephone Encounter (Signed)
Pt daughter called requesting an appt with McDiarmid today. Told daugher McDiarmid is not seeing pt's today, but can get her in with another dr. Liana Gerold 2:30 appt with Juleen China and daughter requested a later appt and told her the 2:30 was the only one we had left. Pt daughter took the 2:30 appt, but was still concerned about some health issues for this pt and just wanted me to let [place] know.

## 2017-10-02 NOTE — Telephone Encounter (Signed)
I spoke with the patient's primary caretaker, her dgt Iran Sizer, by phone. Her mother was started on Levaquin for a possible CAP by portable CXR along with increase fatigue and crackles on exam by the [facility] RN.   She initially improved, but the [facility] RN noted return of crackles a couple days ago.  Patient also presenting with acute, sharp, right hip pain while walking assisted to the BR.  There developed a bruise around her right hip.  She is on Eliquis for history of atrial fibrillation.  The [facility] RN told the dgt that it may be a blood clot that needs a doppler study.    Patient is coming in with dgt this afternoon as a SDA for above concerns  A/ Abnormal lung sounds Fatigue Bruising, right hip Hip pain, acute, right hip On anticoagulation

## 2017-10-02 NOTE — Patient Instructions (Addendum)
We have ordered several lab tests today. Have also ordered a chest x-ray that will need to be done at [place].   For the low blood pressure, don't take the Losartan and take only half of the Metoprolol.   Take Care,   Dr. Juleen China

## 2017-10-02 NOTE — Progress Notes (Signed)
Subjective:    Jordan [name] - 84 y.o. female MRN [id number]  Date of birth: 1919-12-11  HPI  Jordan [name] is here for several concerns. Was recently treated for CAP with Levaquin. Had improvement with her symptoms for a short period of time but then [facility] RN noted that she had abnormal respiratory sounds again and daughter reports her breathing has seemed labored. Denies LE edema and orthopnea. Also concerned about easy bruising. She has developed a bruise over her right mid thigh and pain in the area leading to increased need for assistance with ambulation. No known injury occurred. Had a near fall but was caught by her son before she made contact with anything.     -  reports that she has never smoked. She has never used smokeless tobacco. - Review of Systems: Per HPI. - Past Medical History: Patient Active Problem List   Diagnosis Date Noted  . History of atrial fibrillation 10/02/2017  . Presbyesophagus (senile esophageal dysmotility) 06/16/2017  . Narrowing of Distal Esophagus 06/16/2017  . Dilation of esophagus 06/16/2017  . Generalized osteoarthritis of multiple sites 06/15/2017  . Displaced fracture of fifth metatarsal bone, right foot, subsequent encounter for fracture with routine healing 05/31/2017  . Encounter for long-term opiate analgesic use 04/07/2017  . Pain in right paraspinal region 02/13/2017  . Weight loss, unintentional 08/12/2016  . UTI (urinary tract infection) 05/27/2016  . Idiopathic gout 05/05/2016  . Collapsed vertebra due to osteoporosis ([place]) 10/06/2015  . Atherosclerosis of aorta ([place]) 10/05/2015  . Calcification of native coronary artery 10/05/2015  . Chronic bilateral thoracic back pain 10/05/2015  . History of Food impaction of esophagus   . Esophageal dysmotility   . Hyperlipidemia 02/27/2015  . Diarrhea 08/21/2014  . GERD (gastroesophageal reflux disease) 07/25/2014  . Impaired mobility and ADLs 05/30/2014  . Chronic pain syndrome 05/09/2014  .  Glaucoma 04/18/2014  . Macular degeneration, age related, nonexudative 04/18/2014  . Combined visual hearing impairment 11/12/2013  . History of acute gouty arthritis 09/21/2013  . 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 CKD (chronic kidney disease) stage 3, GFR 30-59 ml/min (HCC) 09/03/2013  . Chronic diastolic heart failure ([place]) 09/01/2013  . Frailty 08/29/2013  . Constipation 08/23/2013  . Alzheimer's dementia 07/30/2013  . Gait instability 08/28/2012  . Xerostomia 01/24/2012  . Insomnia 06/14/2010  . Hereditary and idiopathic peripheral neuropathy 03/29/2010  . Hypothyroidism 06/29/2006  . Depressive disorder 06/29/2006  . HTN (hypertension) 06/29/2006  . CYSTOCELE/RECTOCELE/PROLAPSE,UNSPEC. 06/29/2006  . Urinary incontinence, mixed 06/29/2006  . Osteoarthritis of left hip 06/29/2006   - Medications: reviewed and updated   Objective:   Physical Exam BP (!) 102/38   Pulse 70   Temp 97.6 F (36.4 C) (Oral)   Ht 5\' 2"  (1.575 m)   Wt 121 lb 12.8 oz (55.2 kg)   SpO2 99%   BMI 22.28 kg/m  Gen: NAD, alert, cooperative with exam, appears fatigued  CV: RRR, good S1/S2, no murmur, no LE edema, mildly dry MMM  Resp: diffuse crackles throughout all lung fields worse in the bases  Abd: SNTND, BS present, no guarding or organomegaly Skin: bruising over right mid thigh     Assessment & Plan:   1. Dyspnea, unspecified type Unclear etiology but lung exam shows crackles. No known history of CHF, will obtain BNP. Recently treated for PNA. Patient's lung exam is not consistent with PNA and she is afebrile and not hypoxic. Will obtain CXR  to evaluate for other etiologies of dyspnea.  - DG Chest 2 View; Future - Brain natriuretic peptide  2. Bruising - CBC with Differential - Protime-INR - Comprehensive metabolic panel  3. Hypotension, unspecified hypotension type Patient with low BP at today's visit. Appears minimally  dehydrated. Have recommended holding Losartan and halving the dose of Metoprolol. HH RN will continue to monitor BP and PCP will adjust as needed.   Phill Myron, D.O. 10/02/2017, 3:31 PM PGY-3, [place] [facility]  Patient seen by PCP, Dr. McDiarmid, during this visit who helped to formulate this plan and will follow up on lab/imaging results.

## 2017-10-02 NOTE — Telephone Encounter (Signed)
Will forward to MD to make him aware that patient is coming this afternoon.  Kiron Osmun,CMA

## 2017-10-03 ENCOUNTER — Telehealth: Payer: Self-pay

## 2017-10-03 ENCOUNTER — Encounter: Payer: Self-pay | Admitting: Family Medicine

## 2017-10-03 DIAGNOSIS — S92352D Displaced fracture of fifth metatarsal bone, left foot, subsequent encounter for fracture with routine healing: Secondary | ICD-10-CM | POA: Diagnosis not present

## 2017-10-03 DIAGNOSIS — G309 Alzheimer's disease, unspecified: Secondary | ICD-10-CM | POA: Diagnosis not present

## 2017-10-03 DIAGNOSIS — I5030 Unspecified diastolic (congestive) heart failure: Secondary | ICD-10-CM | POA: Diagnosis not present

## 2017-10-03 DIAGNOSIS — N183 Chronic kidney disease, stage 3 (moderate): Secondary | ICD-10-CM | POA: Diagnosis not present

## 2017-10-03 DIAGNOSIS — I130 Hypertensive heart and chronic kidney disease with heart failure and stage 1 through stage 4 chronic kidney disease, or unspecified chronic kidney disease: Secondary | ICD-10-CM | POA: Diagnosis not present

## 2017-10-03 DIAGNOSIS — F0280 Dementia in other diseases classified elsewhere without behavioral disturbance: Secondary | ICD-10-CM | POA: Diagnosis not present

## 2017-10-03 DIAGNOSIS — J479 Bronchiectasis, uncomplicated: Secondary | ICD-10-CM | POA: Insufficient documentation

## 2017-10-03 HISTORY — DX: Bronchiectasis, uncomplicated: J47.9

## 2017-10-03 LAB — CBC WITH DIFFERENTIAL/PLATELET
BASOS ABS: 0 10*3/uL (ref 0.0–0.2)
Basos: 0 %
EOS (ABSOLUTE): 0 10*3/uL (ref 0.0–0.4)
Eos: 1 %
HEMOGLOBIN: 10.6 g/dL — AB (ref 11.1–15.9)
Hematocrit: 32.8 % — ABNORMAL LOW (ref 34.0–46.6)
IMMATURE GRANS (ABS): 0 10*3/uL (ref 0.0–0.1)
Immature Granulocytes: 0 %
LYMPHS: 32 %
Lymphocytes Absolute: 1.6 10*3/uL (ref 0.7–3.1)
MCH: 31.8 pg (ref 26.6–33.0)
MCHC: 32.3 g/dL (ref 31.5–35.7)
MCV: 99 fL — ABNORMAL HIGH (ref 79–97)
MONOCYTES: 16 %
Monocytes Absolute: 0.8 10*3/uL (ref 0.1–0.9)
NEUTROS PCT: 51 %
Neutrophils Absolute: 2.5 10*3/uL (ref 1.4–7.0)
PLATELETS: 159 10*3/uL (ref 150–450)
RBC: 3.33 x10E6/uL — AB (ref 3.77–5.28)
RDW: 14.9 % (ref 12.3–15.4)
WBC: 4.9 10*3/uL (ref 3.4–10.8)

## 2017-10-03 LAB — COMPREHENSIVE METABOLIC PANEL
ALBUMIN: 3.4 g/dL (ref 3.2–4.6)
ALK PHOS: 130 IU/L — AB (ref 39–117)
ALT: 26 IU/L (ref 0–32)
AST: 37 IU/L (ref 0–40)
Albumin/Globulin Ratio: 1.4 (ref 1.2–2.2)
BILIRUBIN TOTAL: 0.3 mg/dL (ref 0.0–1.2)
BUN / CREAT RATIO: 17 (ref 12–28)
BUN: 26 mg/dL (ref 10–36)
CHLORIDE: 99 mmol/L (ref 96–106)
CO2: 24 mmol/L (ref 20–29)
CREATININE: 1.55 mg/dL — AB (ref 0.57–1.00)
Calcium: 8.8 mg/dL (ref 8.7–10.3)
GFR calc Af Amer: 32 mL/min/{1.73_m2} — ABNORMAL LOW (ref >59)
GFR calc non Af Amer: 28 mL/min/{1.73_m2} — ABNORMAL LOW (ref >59)
GLOBULIN, TOTAL: 2.4 g/dL (ref 1.5–4.5)
GLUCOSE: 83 mg/dL (ref 65–99)
Potassium: 4.1 mmol/L (ref 3.5–5.2)
SODIUM: 135 mmol/L (ref 134–144)
Total Protein: 5.8 g/dL — ABNORMAL LOW (ref 6.0–8.5)

## 2017-10-03 LAB — PROTIME-INR
INR: 1 (ref 0.8–1.2)
PROTHROMBIN TIME: 10.7 s (ref 9.1–12.0)

## 2017-10-03 LAB — BRAIN NATRIURETIC PEPTIDE: BNP: 496.5 pg/mL — ABNORMAL HIGH (ref 0.0–100.0)

## 2017-10-03 NOTE — Telephone Encounter (Signed)
Donita, RN with [place], called with following update of vitals today:  BP 110/50, T 96.4, P 73 and regular,  R 19,  O2 97%  Also states patient is waiting on some lab results. She would like a call about what the PCP says about the labs also.  Call back is [phone]

## 2017-10-03 NOTE — Telephone Encounter (Signed)
Faxed order # [id number] received from [facility] orders. Orders placed in PCP's box for signature. Please return to nurse clinic when complete.  Danley Danker, RN [facility] RN)

## 2017-10-03 NOTE — Telephone Encounter (Signed)
I spoke with thr primary caretaker, Iran Sizer, about Ms Osley's CXR and CBC and CMET results.   The pt has old apical scarring bilaterally.  Review of recent Chest CT shows scattered bronchiactsis which I suspect is the origins of her abnormal crackles on lung exam.   Will continue the reduced metoprolol dose and holding losartan for now.  [place] RN will check the BP today/  Will make changes based on home BP readings.

## 2017-10-05 NOTE — Telephone Encounter (Signed)
Signed order faxed to [facility] at [phone].  Danley Danker, RN [facility] RN)

## 2017-10-09 ENCOUNTER — Telehealth: Payer: Self-pay | Admitting: Family Medicine

## 2017-10-09 ENCOUNTER — Other Ambulatory Visit: Payer: Self-pay | Admitting: Family Medicine

## 2017-10-09 DIAGNOSIS — M25551 Pain in right hip: Secondary | ICD-10-CM

## 2017-10-09 DIAGNOSIS — M810 Age-related osteoporosis without current pathological fracture: Secondary | ICD-10-CM

## 2017-10-09 DIAGNOSIS — R54 Age-related physical debility: Secondary | ICD-10-CM

## 2017-10-09 NOTE — Telephone Encounter (Signed)
-----   Message from Nicolette Bang, DO sent at 10/05/2017 11:59 AM EDT -----   ----- Message ----- From: Lavone Neri Lab Results In Sent: 10/03/2017   8:17 AM To: Nicolette Bang, DO

## 2017-10-09 NOTE — Telephone Encounter (Signed)
Wt Readings from Last 3 Encounters:  10/02/17 121 lb 12.8 oz (55.2 kg)  08/10/17 123 lb (55.8 kg)  06/13/17 125 lb (56.7 kg)    BMP Latest Ref Rng & Units 10/02/2017 06/13/2017 06/02/2017  Glucose 65 - 99 mg/dL 83 90 90  BUN 10 - 36 mg/dL 26 16 29(H)  Creatinine 0.57 - 1.00 mg/dL 1.55(H) 1.43(H) 1.73(H)  BUN/Creat Ratio 12 - 28 17 11(L) -  Sodium 134 - 144 mmol/L 135 139 137  Potassium 3.5 - 5.2 mmol/L 4.1 5.6(H) 4.7  Chloride 96 - 106 mmol/L 99 102 106  CO2 20 - 29 mmol/L 24 25 25   Calcium 8.7 - 10.3 mg/dL 8.8 9.3 8.4(L)

## 2017-10-09 NOTE — Telephone Encounter (Signed)
Called patient's dgt, Jordan [name], to discuss BNP result in stting of Stage IV CKD, Hx G1DD, Weight loss of about 4 pounds, recent possible CAPand no appearance of pulmonary volume overload.  Assess Dyspnea, DOE, right leg pain and bruising of right thigh (incr Alk Phos)

## 2017-10-09 NOTE — Telephone Encounter (Signed)
Acknowledged.

## 2017-10-09 NOTE — Progress Notes (Signed)
I spoke with [name], Iran Sizer, by phone.   She reports that patient's right midthigh pain continues with increased need for assistance with ambulation.  Bruising in right mid-thigh is fading.   Discussed that in setting of osteoporosis, new elevation in her serum alkaline phosphatase, ongoing right thigh pain that an occult femoral fracture around site of bruising is possible, and if present may require intervention of some sort, especially for palliation.   I ordered a DG Femoral, min 2 view Xray at a local ambulatory radiology facility.

## 2017-10-10 ENCOUNTER — Telehealth: Payer: Self-pay

## 2017-10-10 DIAGNOSIS — N183 Chronic kidney disease, stage 3 (moderate): Secondary | ICD-10-CM | POA: Diagnosis not present

## 2017-10-10 DIAGNOSIS — I5030 Unspecified diastolic (congestive) heart failure: Secondary | ICD-10-CM | POA: Diagnosis not present

## 2017-10-10 DIAGNOSIS — S92352D Displaced fracture of fifth metatarsal bone, left foot, subsequent encounter for fracture with routine healing: Secondary | ICD-10-CM | POA: Diagnosis not present

## 2017-10-10 DIAGNOSIS — I130 Hypertensive heart and chronic kidney disease with heart failure and stage 1 through stage 4 chronic kidney disease, or unspecified chronic kidney disease: Secondary | ICD-10-CM | POA: Diagnosis not present

## 2017-10-10 DIAGNOSIS — G309 Alzheimer's disease, unspecified: Secondary | ICD-10-CM | POA: Diagnosis not present

## 2017-10-10 DIAGNOSIS — F0280 Dementia in other diseases classified elsewhere without behavioral disturbance: Secondary | ICD-10-CM | POA: Diagnosis not present

## 2017-10-10 NOTE — Telephone Encounter (Signed)
Donita, RN with [facility], called to report patient's vitals are stable: T 97.5 P 79 O2: 96% BP 120/48 sitting, 108/46 standing Lungs sound much clearer, some crackles in LLL.  Also needs verbal orders: Skilled nursing - recertify PT eval OT eval   Call back is [phone]  Danley Danker, RN [facility] RN)

## 2017-10-11 ENCOUNTER — Ambulatory Visit
Admission: RE | Admit: 2017-10-11 | Discharge: 2017-10-11 | Disposition: A | Discharge status: Home / Self Care | Payer: Medicare Other | Source: Ambulatory Visit | Attending: Family Medicine | Admitting: Family Medicine

## 2017-10-11 DIAGNOSIS — M1711 Unilateral primary osteoarthritis, right knee: Secondary | ICD-10-CM | POA: Diagnosis not present

## 2017-10-11 DIAGNOSIS — M25551 Pain in right hip: Secondary | ICD-10-CM

## 2017-10-11 DIAGNOSIS — M810 Age-related osteoporosis without current pathological fracture: Secondary | ICD-10-CM

## 2017-10-11 DIAGNOSIS — R54 Age-related physical debility: Secondary | ICD-10-CM

## 2017-10-12 ENCOUNTER — Telehealth: Payer: Self-pay | Admitting: Family Medicine

## 2017-10-12 ENCOUNTER — Ambulatory Visit: Payer: Self-pay | Admitting: Family Medicine

## 2017-10-12 NOTE — Telephone Encounter (Signed)
Jordan [name] would like to talk to Dr McDiarmid.  She is wondering if the xray report is in. Mom has an appt today at 4 but no one in the family can bring her. Stasia Cavalier wonders if mom needs to come in for the appt.  Please advise

## 2017-10-12 NOTE — Telephone Encounter (Signed)
Will forward to MD. [name],CMA  

## 2017-10-12 NOTE — Telephone Encounter (Signed)
I spoke to [facility] about relatively normal results of the right femur xrays.   Patient still with pain in right thigh and chronic thoracic back.  HHRN reported to [name] that lung sounds had improved.  VS are in SBP 120 range per dgt report of nursing visit.   Slowly improving clinically overall.  No SHOB.  Adequate oral intake.  No significant decline in cognitive or physical function.   A/P Will cancel today's ov with me gvien gradual improvement and to avoid stress of transportation.  Follow up appointment in one month with Jordan [name] for chronic issues and right thigh pain.  Continue to hold Losartan given lower SBP and susceptibility to hypotension with intercurrent illnesses.  If SBP sustained above 140, then dgt to notify Jordan [name] to discuss options.

## 2017-10-12 NOTE — Telephone Encounter (Signed)
Will forward to MD to advise.

## 2017-10-13 NOTE — Telephone Encounter (Signed)
Please give verbal orders to [facility] for: 1) Continued Skilled Nursing care for next 60 days 2) PT evaluation and treatment 3) OT evaluation and treatment

## 2017-10-13 NOTE — Telephone Encounter (Signed)
Verbal orders given to Donita. 

## 2017-10-13 NOTE — Telephone Encounter (Signed)
Donita still waiting on verbal orders to recertify skilled nursing, PT eval, OT eval.  Call back is [phone]  Danley Danker, RN [facility] RN)

## 2017-10-14 DIAGNOSIS — K219 Gastro-esophageal reflux disease without esophagitis: Secondary | ICD-10-CM | POA: Diagnosis not present

## 2017-10-14 DIAGNOSIS — I130 Hypertensive heart and chronic kidney disease with heart failure and stage 1 through stage 4 chronic kidney disease, or unspecified chronic kidney disease: Secondary | ICD-10-CM | POA: Diagnosis not present

## 2017-10-14 DIAGNOSIS — E785 Hyperlipidemia, unspecified: Secondary | ICD-10-CM | POA: Diagnosis not present

## 2017-10-14 DIAGNOSIS — M810 Age-related osteoporosis without current pathological fracture: Secondary | ICD-10-CM | POA: Diagnosis not present

## 2017-10-14 DIAGNOSIS — F329 Major depressive disorder, single episode, unspecified: Secondary | ICD-10-CM | POA: Diagnosis not present

## 2017-10-14 DIAGNOSIS — M109 Gout, unspecified: Secondary | ICD-10-CM | POA: Diagnosis not present

## 2017-10-14 DIAGNOSIS — M1990 Unspecified osteoarthritis, unspecified site: Secondary | ICD-10-CM | POA: Diagnosis not present

## 2017-10-14 DIAGNOSIS — S92352D Displaced fracture of fifth metatarsal bone, left foot, subsequent encounter for fracture with routine healing: Secondary | ICD-10-CM | POA: Diagnosis not present

## 2017-10-14 DIAGNOSIS — G629 Polyneuropathy, unspecified: Secondary | ICD-10-CM | POA: Diagnosis not present

## 2017-10-14 DIAGNOSIS — F0280 Dementia in other diseases classified elsewhere without behavioral disturbance: Secondary | ICD-10-CM | POA: Diagnosis not present

## 2017-10-14 DIAGNOSIS — E039 Hypothyroidism, unspecified: Secondary | ICD-10-CM | POA: Diagnosis not present

## 2017-10-14 DIAGNOSIS — Z7901 Long term (current) use of anticoagulants: Secondary | ICD-10-CM | POA: Diagnosis not present

## 2017-10-14 DIAGNOSIS — Z87440 Personal history of urinary (tract) infections: Secondary | ICD-10-CM | POA: Diagnosis not present

## 2017-10-14 DIAGNOSIS — N183 Chronic kidney disease, stage 3 (moderate): Secondary | ICD-10-CM | POA: Diagnosis not present

## 2017-10-14 DIAGNOSIS — G894 Chronic pain syndrome: Secondary | ICD-10-CM | POA: Diagnosis not present

## 2017-10-14 DIAGNOSIS — Z8701 Personal history of pneumonia (recurrent): Secondary | ICD-10-CM | POA: Diagnosis not present

## 2017-10-14 DIAGNOSIS — I5030 Unspecified diastolic (congestive) heart failure: Secondary | ICD-10-CM | POA: Diagnosis not present

## 2017-10-14 DIAGNOSIS — G309 Alzheimer's disease, unspecified: Secondary | ICD-10-CM | POA: Diagnosis not present

## 2017-10-14 DIAGNOSIS — M48061 Spinal stenosis, lumbar region without neurogenic claudication: Secondary | ICD-10-CM | POA: Diagnosis not present

## 2017-10-14 DIAGNOSIS — Z950 Presence of cardiac pacemaker: Secondary | ICD-10-CM | POA: Diagnosis not present

## 2017-10-18 ENCOUNTER — Encounter: Payer: Self-pay | Admitting: Family Medicine

## 2017-10-19 DIAGNOSIS — N183 Chronic kidney disease, stage 3 (moderate): Secondary | ICD-10-CM | POA: Diagnosis not present

## 2017-10-19 DIAGNOSIS — I130 Hypertensive heart and chronic kidney disease with heart failure and stage 1 through stage 4 chronic kidney disease, or unspecified chronic kidney disease: Secondary | ICD-10-CM | POA: Diagnosis not present

## 2017-10-19 DIAGNOSIS — F0280 Dementia in other diseases classified elsewhere without behavioral disturbance: Secondary | ICD-10-CM | POA: Diagnosis not present

## 2017-10-19 DIAGNOSIS — I5030 Unspecified diastolic (congestive) heart failure: Secondary | ICD-10-CM | POA: Diagnosis not present

## 2017-10-19 DIAGNOSIS — G309 Alzheimer's disease, unspecified: Secondary | ICD-10-CM | POA: Diagnosis not present

## 2017-10-19 DIAGNOSIS — Z8701 Personal history of pneumonia (recurrent): Secondary | ICD-10-CM | POA: Diagnosis not present

## 2017-10-25 ENCOUNTER — Telehealth: Payer: Self-pay

## 2017-10-25 DIAGNOSIS — I130 Hypertensive heart and chronic kidney disease with heart failure and stage 1 through stage 4 chronic kidney disease, or unspecified chronic kidney disease: Secondary | ICD-10-CM | POA: Diagnosis not present

## 2017-10-25 DIAGNOSIS — F0280 Dementia in other diseases classified elsewhere without behavioral disturbance: Secondary | ICD-10-CM | POA: Diagnosis not present

## 2017-10-25 DIAGNOSIS — N183 Chronic kidney disease, stage 3 (moderate): Secondary | ICD-10-CM | POA: Diagnosis not present

## 2017-10-25 DIAGNOSIS — Z8701 Personal history of pneumonia (recurrent): Secondary | ICD-10-CM | POA: Diagnosis not present

## 2017-10-25 DIAGNOSIS — I5030 Unspecified diastolic (congestive) heart failure: Secondary | ICD-10-CM | POA: Diagnosis not present

## 2017-10-25 DIAGNOSIS — G309 Alzheimer's disease, unspecified: Secondary | ICD-10-CM | POA: Diagnosis not present

## 2017-10-25 NOTE — Telephone Encounter (Signed)
Jordan [name], PT with [facility], called for verbal orders:  Continue HH PT 1-2x/week x 5 weeks  Call back is [phone]  Danley Danker, RN [facility] RN)

## 2017-10-26 NOTE — Telephone Encounter (Signed)
Order given as requested. 

## 2017-10-31 DIAGNOSIS — I130 Hypertensive heart and chronic kidney disease with heart failure and stage 1 through stage 4 chronic kidney disease, or unspecified chronic kidney disease: Secondary | ICD-10-CM | POA: Diagnosis not present

## 2017-10-31 DIAGNOSIS — Z8701 Personal history of pneumonia (recurrent): Secondary | ICD-10-CM | POA: Diagnosis not present

## 2017-10-31 DIAGNOSIS — I5030 Unspecified diastolic (congestive) heart failure: Secondary | ICD-10-CM | POA: Diagnosis not present

## 2017-10-31 DIAGNOSIS — N183 Chronic kidney disease, stage 3 (moderate): Secondary | ICD-10-CM | POA: Diagnosis not present

## 2017-10-31 DIAGNOSIS — G309 Alzheimer's disease, unspecified: Secondary | ICD-10-CM | POA: Diagnosis not present

## 2017-10-31 DIAGNOSIS — F0280 Dementia in other diseases classified elsewhere without behavioral disturbance: Secondary | ICD-10-CM | POA: Diagnosis not present

## 2017-11-01 DIAGNOSIS — G309 Alzheimer's disease, unspecified: Secondary | ICD-10-CM | POA: Diagnosis not present

## 2017-11-01 DIAGNOSIS — F0280 Dementia in other diseases classified elsewhere without behavioral disturbance: Secondary | ICD-10-CM | POA: Diagnosis not present

## 2017-11-01 DIAGNOSIS — Z8701 Personal history of pneumonia (recurrent): Secondary | ICD-10-CM | POA: Diagnosis not present

## 2017-11-01 DIAGNOSIS — I5030 Unspecified diastolic (congestive) heart failure: Secondary | ICD-10-CM | POA: Diagnosis not present

## 2017-11-01 DIAGNOSIS — N183 Chronic kidney disease, stage 3 (moderate): Secondary | ICD-10-CM | POA: Diagnosis not present

## 2017-11-01 DIAGNOSIS — I130 Hypertensive heart and chronic kidney disease with heart failure and stage 1 through stage 4 chronic kidney disease, or unspecified chronic kidney disease: Secondary | ICD-10-CM | POA: Diagnosis not present

## 2017-11-03 DIAGNOSIS — F0280 Dementia in other diseases classified elsewhere without behavioral disturbance: Secondary | ICD-10-CM | POA: Diagnosis not present

## 2017-11-03 DIAGNOSIS — G309 Alzheimer's disease, unspecified: Secondary | ICD-10-CM | POA: Diagnosis not present

## 2017-11-03 DIAGNOSIS — I5030 Unspecified diastolic (congestive) heart failure: Secondary | ICD-10-CM | POA: Diagnosis not present

## 2017-11-03 DIAGNOSIS — N183 Chronic kidney disease, stage 3 (moderate): Secondary | ICD-10-CM | POA: Diagnosis not present

## 2017-11-03 DIAGNOSIS — Z8701 Personal history of pneumonia (recurrent): Secondary | ICD-10-CM | POA: Diagnosis not present

## 2017-11-03 DIAGNOSIS — I130 Hypertensive heart and chronic kidney disease with heart failure and stage 1 through stage 4 chronic kidney disease, or unspecified chronic kidney disease: Secondary | ICD-10-CM | POA: Diagnosis not present

## 2017-11-07 DIAGNOSIS — Z8701 Personal history of pneumonia (recurrent): Secondary | ICD-10-CM | POA: Diagnosis not present

## 2017-11-07 DIAGNOSIS — G309 Alzheimer's disease, unspecified: Secondary | ICD-10-CM | POA: Diagnosis not present

## 2017-11-07 DIAGNOSIS — F0280 Dementia in other diseases classified elsewhere without behavioral disturbance: Secondary | ICD-10-CM | POA: Diagnosis not present

## 2017-11-07 DIAGNOSIS — I130 Hypertensive heart and chronic kidney disease with heart failure and stage 1 through stage 4 chronic kidney disease, or unspecified chronic kidney disease: Secondary | ICD-10-CM | POA: Diagnosis not present

## 2017-11-07 DIAGNOSIS — I5030 Unspecified diastolic (congestive) heart failure: Secondary | ICD-10-CM | POA: Diagnosis not present

## 2017-11-07 DIAGNOSIS — N183 Chronic kidney disease, stage 3 (moderate): Secondary | ICD-10-CM | POA: Diagnosis not present

## 2017-11-09 ENCOUNTER — Ambulatory Visit: Payer: Medicare Other | Admitting: Family Medicine

## 2017-11-09 ENCOUNTER — Telehealth: Payer: Self-pay

## 2017-11-09 DIAGNOSIS — F0280 Dementia in other diseases classified elsewhere without behavioral disturbance: Secondary | ICD-10-CM | POA: Diagnosis not present

## 2017-11-09 DIAGNOSIS — I5030 Unspecified diastolic (congestive) heart failure: Secondary | ICD-10-CM | POA: Diagnosis not present

## 2017-11-09 DIAGNOSIS — I130 Hypertensive heart and chronic kidney disease with heart failure and stage 1 through stage 4 chronic kidney disease, or unspecified chronic kidney disease: Secondary | ICD-10-CM | POA: Diagnosis not present

## 2017-11-09 DIAGNOSIS — N183 Chronic kidney disease, stage 3 (moderate): Secondary | ICD-10-CM | POA: Diagnosis not present

## 2017-11-09 DIAGNOSIS — Z8701 Personal history of pneumonia (recurrent): Secondary | ICD-10-CM | POA: Diagnosis not present

## 2017-11-09 DIAGNOSIS — G309 Alzheimer's disease, unspecified: Secondary | ICD-10-CM | POA: Diagnosis not present

## 2017-11-09 NOTE — Telephone Encounter (Signed)
Donita, RN with [facility], called with following information:  Patient being D/C'ed from nursing [facility] today. PT still working with patient.  She has lost 1 lb per week for last 3 weeks, RN has had her increase her Ensure intake.  Patient very bothered by arthritis pain in back, please consider adding a non-narcotic medication to help with this.  Pat BP today right after taking BP meds was 100/48. RN encourage more fluid intake.  BP and weight will still be monitored.  For questions, call Donita at [place], RN [facility] RN)

## 2017-11-14 DIAGNOSIS — F0280 Dementia in other diseases classified elsewhere without behavioral disturbance: Secondary | ICD-10-CM | POA: Diagnosis not present

## 2017-11-14 DIAGNOSIS — I5030 Unspecified diastolic (congestive) heart failure: Secondary | ICD-10-CM | POA: Diagnosis not present

## 2017-11-14 DIAGNOSIS — Z8701 Personal history of pneumonia (recurrent): Secondary | ICD-10-CM | POA: Diagnosis not present

## 2017-11-14 DIAGNOSIS — N183 Chronic kidney disease, stage 3 (moderate): Secondary | ICD-10-CM | POA: Diagnosis not present

## 2017-11-14 DIAGNOSIS — I130 Hypertensive heart and chronic kidney disease with heart failure and stage 1 through stage 4 chronic kidney disease, or unspecified chronic kidney disease: Secondary | ICD-10-CM | POA: Diagnosis not present

## 2017-11-14 DIAGNOSIS — G309 Alzheimer's disease, unspecified: Secondary | ICD-10-CM | POA: Diagnosis not present

## 2017-11-22 DIAGNOSIS — F0280 Dementia in other diseases classified elsewhere without behavioral disturbance: Secondary | ICD-10-CM | POA: Diagnosis not present

## 2017-11-22 DIAGNOSIS — G309 Alzheimer's disease, unspecified: Secondary | ICD-10-CM | POA: Diagnosis not present

## 2017-11-22 DIAGNOSIS — I5030 Unspecified diastolic (congestive) heart failure: Secondary | ICD-10-CM | POA: Diagnosis not present

## 2017-11-22 DIAGNOSIS — Z8701 Personal history of pneumonia (recurrent): Secondary | ICD-10-CM | POA: Diagnosis not present

## 2017-11-22 DIAGNOSIS — I130 Hypertensive heart and chronic kidney disease with heart failure and stage 1 through stage 4 chronic kidney disease, or unspecified chronic kidney disease: Secondary | ICD-10-CM | POA: Diagnosis not present

## 2017-11-22 DIAGNOSIS — N183 Chronic kidney disease, stage 3 (moderate): Secondary | ICD-10-CM | POA: Diagnosis not present

## 2017-11-23 DIAGNOSIS — G309 Alzheimer's disease, unspecified: Secondary | ICD-10-CM | POA: Diagnosis not present

## 2017-11-23 DIAGNOSIS — I5030 Unspecified diastolic (congestive) heart failure: Secondary | ICD-10-CM | POA: Diagnosis not present

## 2017-11-23 DIAGNOSIS — N183 Chronic kidney disease, stage 3 (moderate): Secondary | ICD-10-CM | POA: Diagnosis not present

## 2017-11-23 DIAGNOSIS — F0280 Dementia in other diseases classified elsewhere without behavioral disturbance: Secondary | ICD-10-CM | POA: Diagnosis not present

## 2017-11-23 DIAGNOSIS — Z8701 Personal history of pneumonia (recurrent): Secondary | ICD-10-CM | POA: Diagnosis not present

## 2017-11-23 DIAGNOSIS — I130 Hypertensive heart and chronic kidney disease with heart failure and stage 1 through stage 4 chronic kidney disease, or unspecified chronic kidney disease: Secondary | ICD-10-CM | POA: Diagnosis not present

## 2017-11-28 ENCOUNTER — Telehealth: Payer: Self-pay | Admitting: Family Medicine

## 2017-11-28 NOTE — Telephone Encounter (Signed)
Will forward to MD. [name],CMA  

## 2017-11-28 NOTE — Telephone Encounter (Signed)
Pt's daughter wanted to let Dr. McDiarmid know that she had her mother's FMLA paperwork faxed to the office to be completed.

## 2017-11-29 ENCOUNTER — Other Ambulatory Visit: Payer: Self-pay | Admitting: Family Medicine

## 2017-12-01 NOTE — Telephone Encounter (Signed)
Daughter Jordan [name] is aware. [name],CMA

## 2017-12-01 NOTE — Telephone Encounter (Signed)
Please let Ms Culmer's dgt know that the completed FMLA form is at [facility] front desk waiting for pick up.

## 2017-12-07 DIAGNOSIS — H401132 Primary open-angle glaucoma, bilateral, moderate stage: Secondary | ICD-10-CM | POA: Diagnosis not present

## 2017-12-09 ENCOUNTER — Other Ambulatory Visit: Payer: Self-pay | Admitting: Family Medicine

## 2017-12-14 ENCOUNTER — Ambulatory Visit: Payer: Medicare Other | Admitting: Family Medicine

## 2017-12-31 ENCOUNTER — Other Ambulatory Visit: Payer: Self-pay | Admitting: Family Medicine

## 2017-12-31 DIAGNOSIS — G609 Hereditary and idiopathic neuropathy, unspecified: Secondary | ICD-10-CM

## 2018-02-08 ENCOUNTER — Encounter: Payer: Self-pay | Admitting: Family Medicine

## 2018-02-08 ENCOUNTER — Ambulatory Visit (INDEPENDENT_AMBULATORY_CARE_PROVIDER_SITE_OTHER): Payer: Medicare Other | Admitting: Family Medicine

## 2018-02-08 ENCOUNTER — Other Ambulatory Visit: Payer: Self-pay

## 2018-02-08 VITALS — BP 140/62 | HR 71 | Temp 98.1°F | Ht 62.0 in | Wt 114.0 lb

## 2018-02-08 DIAGNOSIS — N3946 Mixed incontinence: Secondary | ICD-10-CM

## 2018-02-08 DIAGNOSIS — M2550 Pain in unspecified joint: Secondary | ICD-10-CM | POA: Diagnosis not present

## 2018-02-08 DIAGNOSIS — N184 Chronic kidney disease, stage 4 (severe): Secondary | ICD-10-CM | POA: Diagnosis not present

## 2018-02-08 DIAGNOSIS — R634 Abnormal weight loss: Secondary | ICD-10-CM

## 2018-02-08 DIAGNOSIS — R531 Weakness: Secondary | ICD-10-CM

## 2018-02-08 DIAGNOSIS — R52 Pain, unspecified: Secondary | ICD-10-CM | POA: Diagnosis not present

## 2018-02-08 LAB — POCT URINALYSIS DIP (MANUAL ENTRY)
Bilirubin, UA: NEGATIVE
Glucose, UA: NEGATIVE mg/dL
Ketones, POC UA: NEGATIVE mg/dL
NITRITE UA: NEGATIVE
SPEC GRAV UA: 1.01 (ref 1.010–1.025)
UROBILINOGEN UA: 0.2 U/dL
pH, UA: 7 (ref 5.0–8.0)

## 2018-02-08 LAB — POCT UA - MICROSCOPIC ONLY

## 2018-02-08 MED ORDER — PREDNISONE 10 MG PO TABS: 10.0000 mg | ORAL_TABLET | Freq: Two times a day (BID) | ORAL | 0 refills | Status: DC

## 2018-02-08 NOTE — Patient Instructions (Signed)
Start a trial of prednisone for possible Polymyalgia Rheumatica.  Take Prednisone 10 mg tablet, one tablet twice a day with morning and evening meals.  [name] should feel at least 50% better within 7 to 10 days of starting the prednisone, if she has Polymyalgia Rheumatica. If [name] does have a good response to the prednisone, she will need to stay on it for a longer period.    If she does not have a good response to the prednisone, then she should stop the prednisone.  Dr Elektra Wartman will look into whether or not [name] has had a injections in her spinal column for arthritis or spinal stenosis.  If she continues to have pain in her back, spinal column injections may be an option to help with pain that does not involve taking pain medicine with their side effects.   Will see if there is evidence of bladder infection on urine testing and culture.  Dr Lucindia Lemley will contact you with the test results, and recommendations whether to start antibiotics or not.       Polymyalgia Rheumatica Polymyalgia rheumatica (PMR) is an inflammatory disorder that causes aching and stiffness in your muscles and joints. Sometimes, PMR leads to a more dangerous condition (temporal arteritis or giant cell arteritis), which can cause vision loss. What are the causes? The exact cause of PMR is not known. What increases the risk? This condition is more likely to develop in:  Females.  People who are 82 years of age or older.  Caucasians.  What are the signs or symptoms?  Pain and stiffness are the main symptoms of PMR. Symptoms may start slowly or suddenly. The symptoms:  May be worse after inactivity and in the morning.  May affect your: ? Hips, buttocks, and thighs. ? Neck, arms, and shoulders. This can make it hard to raise your arms above your head. ? Hands and wrists.  Other symptoms include:  Fever.  Tiredness.  Weakness.  Decreased appetite. This may lead to weight loss.  How is this  diagnosed? This condition is diagnosed with a medical history and physical exam. You may need to see a health care provider who specializes in diseases of the joint, muscles, and bones (rheumatologist). You may also have tests, including:  Blood tests.  X-rays.  How is this treated? PMR usually goes away without treatment, but it may take years for that to happen. In the meantime, your health care provider may recommend low-dose steroids to help manage your symptoms of pain and stiffness. Regular exercise and rest will also help your symptoms. Follow these instructions at home:  Take over-the-counter and prescription medicines only as told by your health care provider.  Make sure to get enough rest and sleep.  Eat a healthy and nutritious diet.  Try to exercise most days of the week. Ask your health care provider what type of exercise is best for you.  Keep all follow-up visits as told by your health are provider. This is important. Contact a health care provider if:  Your symptoms are not controlled with medicine.  You have side effects from steroids. These may include: ? Weight gain. ? Swelling. ? Insomnia. ? Mood changes. ? Bruising. ? High blood sugar readings, if you have diabetes. ? Higher than normal blood pressure readings, if you monitor your blood pressure. Get help right away if:  You develop symptoms of temporal arteritis, such as: ? A change in vision. ? Severe headache. ? Scalp pain. ? Jaw  pain. This information is not intended to replace advice given to you by your health care provider. Make sure you discuss any questions you have with your health care provider. Document Released: 05/26/2004 Document Revised: 09/24/2015 Document Reviewed: 10/29/2014 Elsevier Interactive Patient Education  [employer].

## 2018-02-09 ENCOUNTER — Encounter: Payer: Self-pay | Admitting: Family Medicine

## 2018-02-09 DIAGNOSIS — N184 Chronic kidney disease, stage 4 (severe): Secondary | ICD-10-CM | POA: Insufficient documentation

## 2018-02-09 DIAGNOSIS — R52 Pain, unspecified: Secondary | ICD-10-CM | POA: Insufficient documentation

## 2018-02-09 LAB — SEDIMENTATION RATE: Sed Rate: 27 mm/hr (ref 0–40)

## 2018-02-09 NOTE — Assessment & Plan Note (Signed)
Intermittent issue.  Most recent decline of 9% weight since February. No clear major contributing cause, suspect multifactorial with greatest contribution from Frailty status, along with medications, chronic pain, cognitive impairment, CKD and  R/O PMR No specific interventions except offering favorite foods and nutritional protein/caloric supplements after meals.   Last TSH in January WNL.

## 2018-02-09 NOTE — Progress Notes (Addendum)
Subjective:    Patient ID: Jordan [name], female    DOB: Jul 29, 1919, 84 y.o.   MRN: [id number] Jordan [name] is accompanied by patient and daughter, Jordan [name], who is pt's HCPOA Sources of clinical information for visit is/are patient, relative(s) and past medical records. Nursing assessment for this office visit was reviewed with the patient for accuracy and revision.  Previous Report(s) Reviewed: x-ray reports  Depression screen PHQ 2/9 10/02/2017  Decreased Interest 0  Down, Depressed, Hopeless 0  PHQ - 2 Score 0  Altered sleeping -  Tired, decreased energy -  Change in appetite -  Feeling bad or failure about yourself  -  Trouble concentrating -  Moving slowly or fidgety/restless -  Suicidal thoughts -  PHQ-9 Score -  Some recent data might be hidden   Fall Risk  10/02/2017 06/13/2017 04/06/2017 02/13/2017 01/19/2017  Falls in the past year? No Yes No No No  Number falls in past yr: - 2 or more - - -  Comment - - - - -  Injury with Fall? - Yes - - -  Comment - - - - -  Risk Factor Category  - High Fall Risk - - -  Risk for fall due to : - - - - -  Risk for fall due to: Comment - - - - -  Follow up - - - - -     Adult vaccines due  Topic Date Due  . TETANUS/TDAP  05/19/2019   There are no preventive care reminders to display for this patient.  Health Maintenance Due  Topic Date Due  . INFLUENZA VACCINE  11/30/2017     HPI  Problem List Items Addressed This Visit      High   Weight loss, unintentional - recurrent problem.  OV 08/17/16 for wl.   - diet mech soft (+) frailty: chronic fatigue, weight loss, minimal energy exertion, general weakness, minimal walking.  - Polypharmacy with > 4 medications - Med effects: Cymbalta, LT4, apixaban (via nausea) - No known malignancy - No known ongoing inflammatory disorder - No recent acute illness - (+) depressed mood/anxiety symptoms - History food impaction - No N?V, no biliary surgeries, no specialized diet - (+)  cognitive impairment - (+) chronic pain   Generalized pain Onset: chronic pain with increase complaint of pain all over in last few months. Location: multiple locations, unable to get clear idea of involved areas.  MOst improtant pain to patient is posterior thorax, ? Paramedian pos thorax?, points to right medial knees.  Does not indicate shoulders Quality: uncomfortable, aching Severity: main concern of patient Function: patient dependent in bathing, dressing, ambulation (almost entirely WC-bound), toileting  Pattern: constant Course: stable Radiation: bilateral thighs Relief: Percocet did not help with pain and made patient nauseous, no change with gabapentin doses On duloxetine 60 mg daily for chronic pain syndrome.  Moving in [facility] helps only a little.  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 Veverly Fells)  - Neurology consult with Dr Krista Blue for headaches 10/03/13: c/o pressure sensation along spine, low back pain, multiple joint pains fatigue, neck pain, stiffness. decreased activity. Dr Krista Blue treated with prednisone with which patient low dose prednisone with mild improvement. Stoped prednisone soon after start with normal CRP and ESR.  Diagnosed TT headaches -  Telephone note of referral 06/16/13 to [facility] - Dr Kittie Plater. No record in Care Everywhere of this consult visit.  - [facility] Consultation 06/18/14 dx rotator cuff tear by MRI with GH instability.  Intra-articluar steroid injectin under ultrasound.  10/03/12 [facility] consultation lumbar-sacral spine XR by Neurolgy showed moderate -to-severe degenerate joint disease & degenerate disc disease at multiple levels, and spinal stenosis & . Meloxicam was reported as effective for the pain but not using secondary to  renal function (CKD 4). Referral to Pain Mangement for consideration of epidural steroid injection as that wortked in past for pt's neck pain.  No record in Epic of consult note from Pain Specialist 10/10/12 Dr Marlaine Hind [facility]: [facility] note 02/15/13 mentions spinal injection planned "next week"    Other Visit Diagnoses    Polyarthralgia    -  Primary   Relevant Orders   Sedimentation Rate   Weakness generalized       Relevant Orders   Urine Culture   POCT urinalysis dipstick (Completed)   POCT UA - Microscopic Only (Completed)    Urinary Incontinence - Longstanding problem - Taking Toviaz for urge incontinence. Also taking duloxetine that helps with stress incontinence. - Still requiring use about 6 adult diapers a day to maintain perineal skin integrity.  - no rash, no skin breakdown.   SH: no smoking  Review of Systemssee HPI No headaches    Objective:   Physical Exam VS reviewed GEN: Alert, Cooperative, Groomed, NAD, no enlarged t. Artery,  HEENT: No conj inj COR: RRR, No M/G/R, No JVD, Normal PMI size and location LUNGS: BCTA, No Acc mm use, speaking in full sentences ABDOMEN: (+)BS, soft, NT, ND EXT: No peripheral leg edema, medial knee bony hypertrophy bilaterally, Left > Right, no knee edema or erythema.  Heberden and Bouchard nodes of digits bilateral. No digit edema or erythema.  No tenderness of shoulder girdle to palpation. (+) muscle and fat wasting of thighs and shoulders SKIN: No lesion nor rashes of face Gait: Normal speed, No significant path deviation, Step through +,  Psych: Normal affect/thought/speech/language    Assessment & Plan:  Visit Problem List with A/P  No problem-specific Assessment & Plan notes found for this encounter.

## 2018-02-09 NOTE — Assessment & Plan Note (Signed)
Chronic pain syndrome with subacute worsening of generalized pain.  Will check ESR for PMR evidence, empiric 10 day trial of prednisone 10 mg BID PO  Consideration of re-consultation with Dr Brien Few (PM&R, he saw pt in 10/10/12 and performed injections) for consideration of spinal column joint or epidural injections for OA and possible spinal stenosis with leg pain  Could consider start low and go slow trial of meloxicam as it seem to help with pain in past.  Would need to watch SCr closely (CKD 4).

## 2018-02-10 LAB — URINE CULTURE

## 2018-02-12 ENCOUNTER — Telehealth: Payer: Self-pay | Admitting: *Deleted

## 2018-02-12 NOTE — Telephone Encounter (Signed)
-----   Message from Blane Ohara McDiarmid, MD sent at 02/09/2018 11:38 AM EDT ----- [name] -   Please let Ms Lucien's dgt know that  1) the urinalysis did not show infection (still waiting on final urine culture)  2) her mother's sedimentation rate was normal, making polymyalgia rheumatica much less likely.  3) Dr McDiarmid would still like for Ms Sermons to take the 10 days of prednisone to see if it can help with her general pains.   4) There is not an indication for antibiotics at this time.   5) We will let them know when we have final urine culture results  Thank you, Sherren Mocha

## 2018-02-12 NOTE — Telephone Encounter (Signed)
Daughter informed of results.  Patient has already started taking the prednisone.  Saranya Harlin,CMA

## 2018-02-23 ENCOUNTER — Other Ambulatory Visit: Payer: Self-pay | Admitting: Family Medicine

## 2018-02-23 DIAGNOSIS — R52 Pain, unspecified: Secondary | ICD-10-CM

## 2018-02-23 DIAGNOSIS — M159 Polyosteoarthritis, unspecified: Secondary | ICD-10-CM

## 2018-02-23 DIAGNOSIS — M1612 Unilateral primary osteoarthritis, left hip: Secondary | ICD-10-CM

## 2018-02-23 MED ORDER — PREDNISONE 10 MG PO TABS: 10.0000 mg | ORAL_TABLET | Freq: Two times a day (BID) | ORAL | 0 refills | Status: DC

## 2018-02-23 NOTE — Telephone Encounter (Signed)
.  please let dgt know 10-day refill of prednisone sent into pharmacy for her mother.

## 2018-02-23 NOTE — Telephone Encounter (Signed)
Pt daughter called and would like to know if her mother can have her prednisone refilled. Pt daughter would like for someone to call her at [phone] when this has been sent in.

## 2018-02-23 NOTE — Telephone Encounter (Signed)
Daughter informed. [name],CMA  

## 2018-02-23 NOTE — Telephone Encounter (Signed)
Will check with provider to see if patient can have another round of this medication.  [name],CMA

## 2018-02-27 ENCOUNTER — Other Ambulatory Visit: Payer: Self-pay | Admitting: Family Medicine

## 2018-02-27 DIAGNOSIS — G609 Hereditary and idiopathic neuropathy, unspecified: Secondary | ICD-10-CM

## 2018-03-14 DIAGNOSIS — Z950 Presence of cardiac pacemaker: Secondary | ICD-10-CM | POA: Diagnosis not present

## 2018-03-14 DIAGNOSIS — I4891 Unspecified atrial fibrillation: Secondary | ICD-10-CM | POA: Diagnosis not present

## 2018-03-14 DIAGNOSIS — Z45018 Encounter for adjustment and management of other part of cardiac pacemaker: Secondary | ICD-10-CM | POA: Diagnosis not present

## 2018-03-14 NOTE — Assessment & Plan Note (Signed)
Established problem Controlled Continue current therapy regiment.Toviaz, duloxetine and average of 6 adult diapers daily.

## 2018-03-16 ENCOUNTER — Telehealth: Payer: Self-pay | Admitting: Family Medicine

## 2018-03-16 ENCOUNTER — Other Ambulatory Visit: Payer: Self-pay | Admitting: Family Medicine

## 2018-03-16 MED ORDER — CEPHALEXIN 500 MG PO CAPS: 500.0000 mg | ORAL_CAPSULE | Freq: Two times a day (BID) | ORAL | 0 refills | Status: AC

## 2018-03-16 NOTE — Telephone Encounter (Signed)
My co-resident Dr. Maudie Mercury erroneously received a page from Jordan [name]'s daughter that was meant for the after hours pager, so I helped her address this page.  Jordan [name]'s daughter says that Jordan [name] has been having dark, cloudy urine recently, and she says that because her mother is so elderly, Dr. Vikki Ports usually lets her drop off a urine sample to be tested in our clinic.  She says that this is usually the only symptom her mother has when having a UTI.  However, today our clinic is closed in the afternoon for the afternoon didactic session.  We advised her that she should either bring her mother to an urgent care to have her urine tested or we could go ahead and give her a prescription for this presumed UTI since she gets them often.  Jordan [name]'s daughter elected to have a prescription sent to her pharmacy.  We checked her last urine culture, which grew pansensitive E. Coli, and sent a 5-day supply of Keflex 500 mg twice daily to patient's pharmacy.

## 2018-04-05 ENCOUNTER — Encounter (HOSPITAL_COMMUNITY): Payer: Self-pay

## 2018-04-05 ENCOUNTER — Emergency Department (HOSPITAL_COMMUNITY): Payer: Medicare Other

## 2018-04-05 ENCOUNTER — Telehealth: Payer: Self-pay

## 2018-04-05 ENCOUNTER — Inpatient Hospital Stay (HOSPITAL_COMMUNITY)
Admission: EM | Admit: 2018-04-05 | Discharge: 2018-04-10 | DRG: 439 | Disposition: A | Discharge status: Home Health | Payer: Medicare Other | Attending: Family Medicine | Admitting: Family Medicine

## 2018-04-05 DIAGNOSIS — R1013 Epigastric pain: Secondary | ICD-10-CM | POA: Diagnosis not present

## 2018-04-05 DIAGNOSIS — N179 Acute kidney failure, unspecified: Secondary | ICD-10-CM | POA: Diagnosis present

## 2018-04-05 DIAGNOSIS — F0280 Dementia in other diseases classified elsewhere without behavioral disturbance: Secondary | ICD-10-CM | POA: Diagnosis not present

## 2018-04-05 DIAGNOSIS — Z888 Allergy status to other drugs, medicaments and biological substances status: Secondary | ICD-10-CM

## 2018-04-05 DIAGNOSIS — Z8719 Personal history of other diseases of the digestive system: Secondary | ICD-10-CM

## 2018-04-05 DIAGNOSIS — K5900 Constipation, unspecified: Secondary | ICD-10-CM | POA: Diagnosis not present

## 2018-04-05 DIAGNOSIS — G894 Chronic pain syndrome: Secondary | ICD-10-CM | POA: Diagnosis present

## 2018-04-05 DIAGNOSIS — E039 Hypothyroidism, unspecified: Secondary | ICD-10-CM | POA: Diagnosis present

## 2018-04-05 DIAGNOSIS — F329 Major depressive disorder, single episode, unspecified: Secondary | ICD-10-CM | POA: Diagnosis present

## 2018-04-05 DIAGNOSIS — R52 Pain, unspecified: Secondary | ICD-10-CM | POA: Diagnosis not present

## 2018-04-05 DIAGNOSIS — I130 Hypertensive heart and chronic kidney disease with heart failure and stage 1 through stage 4 chronic kidney disease, or unspecified chronic kidney disease: Secondary | ICD-10-CM | POA: Diagnosis present

## 2018-04-05 DIAGNOSIS — M109 Gout, unspecified: Secondary | ICD-10-CM | POA: Diagnosis present

## 2018-04-05 DIAGNOSIS — N1832 Chronic kidney disease, stage 3b: Secondary | ICD-10-CM | POA: Diagnosis present

## 2018-04-05 DIAGNOSIS — K5909 Other constipation: Secondary | ICD-10-CM | POA: Diagnosis present

## 2018-04-05 DIAGNOSIS — D631 Anemia in chronic kidney disease: Secondary | ICD-10-CM | POA: Diagnosis present

## 2018-04-05 DIAGNOSIS — R8271 Bacteriuria: Secondary | ICD-10-CM | POA: Diagnosis present

## 2018-04-05 DIAGNOSIS — Z91030 Bee allergy status: Secondary | ICD-10-CM

## 2018-04-05 DIAGNOSIS — N183 Chronic kidney disease, stage 3 (moderate): Secondary | ICD-10-CM | POA: Diagnosis not present

## 2018-04-05 DIAGNOSIS — K760 Fatty (change of) liver, not elsewhere classified: Secondary | ICD-10-CM | POA: Diagnosis not present

## 2018-04-05 DIAGNOSIS — G309 Alzheimer's disease, unspecified: Secondary | ICD-10-CM | POA: Diagnosis present

## 2018-04-05 DIAGNOSIS — M810 Age-related osteoporosis without current pathological fracture: Secondary | ICD-10-CM | POA: Diagnosis present

## 2018-04-05 DIAGNOSIS — M25559 Pain in unspecified hip: Secondary | ICD-10-CM | POA: Diagnosis present

## 2018-04-05 DIAGNOSIS — M5136 Other intervertebral disc degeneration, lumbar region: Secondary | ICD-10-CM | POA: Diagnosis present

## 2018-04-05 DIAGNOSIS — E7800 Pure hypercholesterolemia, unspecified: Secondary | ICD-10-CM | POA: Diagnosis not present

## 2018-04-05 DIAGNOSIS — Z9842 Cataract extraction status, left eye: Secondary | ICD-10-CM

## 2018-04-05 DIAGNOSIS — R0989 Other specified symptoms and signs involving the circulatory and respiratory systems: Secondary | ICD-10-CM | POA: Diagnosis present

## 2018-04-05 DIAGNOSIS — K851 Biliary acute pancreatitis without necrosis or infection: Principal | ICD-10-CM | POA: Diagnosis present

## 2018-04-05 DIAGNOSIS — I10 Essential (primary) hypertension: Secondary | ICD-10-CM | POA: Diagnosis not present

## 2018-04-05 DIAGNOSIS — J90 Pleural effusion, not elsewhere classified: Secondary | ICD-10-CM | POA: Diagnosis not present

## 2018-04-05 DIAGNOSIS — I498 Other specified cardiac arrhythmias: Secondary | ICD-10-CM | POA: Diagnosis not present

## 2018-04-05 DIAGNOSIS — Z7901 Long term (current) use of anticoagulants: Secondary | ICD-10-CM

## 2018-04-05 DIAGNOSIS — N3946 Mixed incontinence: Secondary | ICD-10-CM | POA: Diagnosis present

## 2018-04-05 DIAGNOSIS — Z79890 Hormone replacement therapy (postmenopausal): Secondary | ICD-10-CM

## 2018-04-05 DIAGNOSIS — R1084 Generalized abdominal pain: Secondary | ICD-10-CM | POA: Diagnosis not present

## 2018-04-05 DIAGNOSIS — I480 Paroxysmal atrial fibrillation: Secondary | ICD-10-CM | POA: Diagnosis not present

## 2018-04-05 DIAGNOSIS — M25569 Pain in unspecified knee: Secondary | ICD-10-CM | POA: Diagnosis present

## 2018-04-05 DIAGNOSIS — E038 Other specified hypothyroidism: Secondary | ICD-10-CM | POA: Diagnosis not present

## 2018-04-05 DIAGNOSIS — G609 Hereditary and idiopathic neuropathy, unspecified: Secondary | ICD-10-CM | POA: Diagnosis present

## 2018-04-05 DIAGNOSIS — G301 Alzheimer's disease with late onset: Secondary | ICD-10-CM | POA: Diagnosis not present

## 2018-04-05 DIAGNOSIS — Z90710 Acquired absence of both cervix and uterus: Secondary | ICD-10-CM

## 2018-04-05 DIAGNOSIS — Z841 Family history of disorders of kidney and ureter: Secondary | ICD-10-CM

## 2018-04-05 DIAGNOSIS — R0902 Hypoxemia: Secondary | ICD-10-CM | POA: Diagnosis not present

## 2018-04-05 DIAGNOSIS — Z9841 Cataract extraction status, right eye: Secondary | ICD-10-CM

## 2018-04-05 DIAGNOSIS — K219 Gastro-esophageal reflux disease without esophagitis: Secondary | ICD-10-CM | POA: Diagnosis present

## 2018-04-05 DIAGNOSIS — Z950 Presence of cardiac pacemaker: Secondary | ICD-10-CM

## 2018-04-05 DIAGNOSIS — F419 Anxiety disorder, unspecified: Secondary | ICD-10-CM | POA: Diagnosis present

## 2018-04-05 DIAGNOSIS — I5032 Chronic diastolic (congestive) heart failure: Secondary | ICD-10-CM | POA: Diagnosis present

## 2018-04-05 DIAGNOSIS — K859 Acute pancreatitis without necrosis or infection, unspecified: Secondary | ICD-10-CM | POA: Diagnosis not present

## 2018-04-05 DIAGNOSIS — E785 Hyperlipidemia, unspecified: Secondary | ICD-10-CM | POA: Diagnosis present

## 2018-04-05 DIAGNOSIS — Z9079 Acquired absence of other genital organ(s): Secondary | ICD-10-CM

## 2018-04-05 DIAGNOSIS — M47816 Spondylosis without myelopathy or radiculopathy, lumbar region: Secondary | ICD-10-CM | POA: Diagnosis present

## 2018-04-05 DIAGNOSIS — I2584 Coronary atherosclerosis due to calcified coronary lesion: Secondary | ICD-10-CM | POA: Diagnosis present

## 2018-04-05 DIAGNOSIS — I2510 Atherosclerotic heart disease of native coronary artery without angina pectoris: Secondary | ICD-10-CM | POA: Diagnosis present

## 2018-04-05 DIAGNOSIS — J984 Other disorders of lung: Secondary | ICD-10-CM | POA: Diagnosis not present

## 2018-04-05 DIAGNOSIS — Z91048 Other nonmedicinal substance allergy status: Secondary | ICD-10-CM

## 2018-04-05 DIAGNOSIS — I959 Hypotension, unspecified: Secondary | ICD-10-CM | POA: Diagnosis not present

## 2018-04-05 DIAGNOSIS — Z79899 Other long term (current) drug therapy: Secondary | ICD-10-CM

## 2018-04-05 DIAGNOSIS — N281 Cyst of kidney, acquired: Secondary | ICD-10-CM | POA: Diagnosis not present

## 2018-04-05 HISTORY — DX: Acute pancreatitis without necrosis or infection, unspecified: K85.90

## 2018-04-05 LAB — CBC WITH DIFFERENTIAL/PLATELET
Abs Immature Granulocytes: 0.1 10*3/uL — ABNORMAL HIGH (ref 0.00–0.07)
BASOS ABS: 0 10*3/uL (ref 0.0–0.1)
BASOS PCT: 0 %
EOS ABS: 0 10*3/uL (ref 0.0–0.5)
EOS PCT: 0 %
HCT: 35.2 % — ABNORMAL LOW (ref 36.0–46.0)
Hemoglobin: 11.3 g/dL — ABNORMAL LOW (ref 12.0–15.0)
Immature Granulocytes: 1 %
LYMPHS PCT: 5 %
Lymphs Abs: 0.7 10*3/uL (ref 0.7–4.0)
MCH: 33.8 pg (ref 26.0–34.0)
MCHC: 32.1 g/dL (ref 30.0–36.0)
MCV: 105.4 fL — ABNORMAL HIGH (ref 80.0–100.0)
MONO ABS: 1 10*3/uL (ref 0.1–1.0)
Monocytes Relative: 7 %
NRBC: 0 % (ref 0.0–0.2)
Neutro Abs: 12.1 10*3/uL — ABNORMAL HIGH (ref 1.7–7.7)
Neutrophils Relative %: 87 %
PLATELETS: 149 10*3/uL — AB (ref 150–400)
RBC: 3.34 MIL/uL — ABNORMAL LOW (ref 3.87–5.11)
RDW: 14.7 % (ref 11.5–15.5)
WBC: 13.9 10*3/uL — AB (ref 4.0–10.5)

## 2018-04-05 LAB — I-STAT TROPONIN, ED: Troponin i, poc: 0.02 ng/mL (ref 0.00–0.08)

## 2018-04-05 LAB — COMPREHENSIVE METABOLIC PANEL
ALT: 135 U/L — AB (ref 0–44)
AST: 231 U/L — AB (ref 15–41)
Albumin: 3 g/dL — ABNORMAL LOW (ref 3.5–5.0)
Alkaline Phosphatase: 242 U/L — ABNORMAL HIGH (ref 38–126)
Anion gap: 12 (ref 5–15)
BILIRUBIN TOTAL: 4 mg/dL — AB (ref 0.3–1.2)
BUN: 25 mg/dL — AB (ref 8–23)
CHLORIDE: 99 mmol/L (ref 98–111)
CO2: 26 mmol/L (ref 22–32)
CREATININE: 1.5 mg/dL — AB (ref 0.44–1.00)
Calcium: 8.8 mg/dL — ABNORMAL LOW (ref 8.9–10.3)
GFR, EST AFRICAN AMERICAN: 34 mL/min — AB (ref >60)
GFR, EST NON AFRICAN AMERICAN: 29 mL/min — AB (ref >60)
Glucose, Bld: 110 mg/dL — ABNORMAL HIGH (ref 70–99)
Potassium: 4.6 mmol/L (ref 3.5–5.1)
Sodium: 137 mmol/L (ref 135–145)
TOTAL PROTEIN: 5.7 g/dL — AB (ref 6.5–8.1)

## 2018-04-05 LAB — I-STAT CG4 LACTIC ACID, ED
LACTIC ACID, VENOUS: 2.73 mmol/L — AB (ref 0.5–1.9)
Lactic Acid, Venous: 1.55 mmol/L (ref 0.5–1.9)

## 2018-04-05 LAB — PROTIME-INR
INR: 0.99
Prothrombin Time: 13 seconds (ref 11.4–15.2)

## 2018-04-05 LAB — HEPARIN LEVEL (UNFRACTIONATED): Heparin Unfractionated: 0.88 IU/mL — ABNORMAL HIGH (ref 0.30–0.70)

## 2018-04-05 LAB — APTT: aPTT: 34 seconds (ref 24–36)

## 2018-04-05 LAB — LIPASE, BLOOD: LIPASE: 876 U/L — AB (ref 11–51)

## 2018-04-05 MED ORDER — SODIUM CHLORIDE 0.9 % IV SOLN
Freq: Once | INTRAVENOUS | Status: AC
  Administered 2018-04-05: 16:00:00 via INTRAVENOUS

## 2018-04-05 MED ORDER — ONDANSETRON HCL 4 MG/2ML IJ SOLN
4.0000 mg | Freq: Once | INTRAMUSCULAR | Status: AC | PRN
  Administered 2018-04-05: 4 mg via INTRAVENOUS
  Filled 2018-04-05: qty 2

## 2018-04-05 MED ORDER — ONDANSETRON HCL 4 MG/2ML IJ SOLN: 4.0000 mg | Freq: Four times a day (QID) | INTRAMUSCULAR | Status: DC | PRN

## 2018-04-05 MED ORDER — PIPERACILLIN-TAZOBACTAM 3.375 G IVPB 30 MIN
3.3750 g | Freq: Once | INTRAVENOUS | Status: AC
  Administered 2018-04-05: 3.375 g via INTRAVENOUS
  Filled 2018-04-05: qty 50

## 2018-04-05 MED ORDER — ONDANSETRON HCL 4 MG PO TABS: 4.0000 mg | ORAL_TABLET | Freq: Four times a day (QID) | ORAL | Status: DC | PRN

## 2018-04-05 MED ORDER — MORPHINE SULFATE (PF) 2 MG/ML IV SOLN
2.0000 mg | INTRAVENOUS | Status: DC | PRN
  Administered 2018-04-05 – 2018-04-06 (×3): 2 mg via INTRAVENOUS
  Filled 2018-04-05 (×3): qty 1

## 2018-04-05 MED ORDER — DEXTROSE IN LACTATED RINGERS 5 % IV SOLN
INTRAVENOUS | Status: DC
  Administered 2018-04-05 – 2018-04-07 (×4): via INTRAVENOUS

## 2018-04-05 MED ORDER — LACTATED RINGERS IV BOLUS
1000.0000 mL | Freq: Once | INTRAVENOUS | Status: AC
  Administered 2018-04-05: 1000 mL via INTRAVENOUS

## 2018-04-05 MED ORDER — LIP MEDEX EX OINT
TOPICAL_OINTMENT | CUTANEOUS | Status: DC | PRN
  Filled 2018-04-05: qty 7

## 2018-04-05 MED ORDER — PANTOPRAZOLE SODIUM 40 MG IV SOLR
40.0000 mg | Freq: Once | INTRAVENOUS | Status: AC
  Administered 2018-04-05: 40 mg via INTRAVENOUS
  Filled 2018-04-05: qty 40

## 2018-04-05 MED ORDER — MORPHINE SULFATE (PF) 2 MG/ML IV SOLN
2.0000 mg | Freq: Once | INTRAVENOUS | Status: AC
  Administered 2018-04-05: 2 mg via INTRAVENOUS
  Filled 2018-04-05: qty 1

## 2018-04-05 MED ORDER — HYDROMORPHONE HCL 1 MG/ML IJ SOLN: 1.0000 mg | INTRAMUSCULAR | Status: DC | PRN

## 2018-04-05 MED ORDER — HEPARIN (PORCINE) 25000 UT/250ML-% IV SOLN
800.0000 [IU]/h | INTRAVENOUS | Status: DC
  Administered 2018-04-05: 850 [IU]/h via INTRAVENOUS
  Filled 2018-04-05 (×2): qty 250

## 2018-04-05 NOTE — Telephone Encounter (Signed)
Patient daughter, Stasia Cavalier, called to report some symptoms her mother is having. Stated she woke up with some abdominal pain located in her upper abdomen under her breasts, stated it is not near her known hernia. Constant pain but not increasing in severity. No fever, no vomiting, no diarrhea. Positive nausea, no BM in 2 days, suppository given approx 5 minutes ago. Patient has not eaten today and has only had water to drink.  Advised to wait about an hour or so to see if suppository helps the pain and offer some crackers/toast since she has not eaten. Daughter states she seems tired and maybe a little pale, concerned she may be dehydrated. No appts available today so advised UC if no better after suppository so a provider can see her and also possibly do a UA to check for UTI and see if fluids needed.  Daughter agreeable. Note routed to PCP for information purposes.

## 2018-04-05 NOTE — Progress Notes (Signed)
ANTICOAGULATION CONSULT NOTE - Initial Consult  Pharmacy Consult for Hepairn  Indication: atrial fibrillation  Allergies  Allergen Reactions  . Chlorthalidone Other (See Comments)    Hyponatremia  . Honey Bee Treatment [Bee Venom] Swelling and Rash  . Verapamil Other (See Comments)    Bradycardia - HR  35-45 on 40mg  TID  . Adhesive [Tape] Rash and Other (See Comments)    Skin irritation  . Amlodipine Other (See Comments)    Peripheral edema with 2.5mg  daily dose    Patient Measurements:   Heparin Dosing Weight: 52 kg weight from 10/19  Vital Signs: Temp: 97.8 F (36.6 C) (12/05 1528) Temp Source: Oral (12/05 1528) BP: 100/35 (12/05 2030) Pulse Rate: 88 (12/05 2030)  Labs: Recent Labs    04/05/18 1624  HGB 11.3*  HCT 35.2*  PLT 149*  LABPROT 13.0  INR 0.99  CREATININE 1.50*    CrCl cannot be calculated (Unknown ideal weight.).   Medical History: Past Medical History:  Diagnosis Date  . AKI (acute kidney injury) ([place])   . Alzheimer's dementia [facility]) 07/30/2013   04/17/14 MoCA - Blind (Spanish version); 9 out of 22.  Correct MoCA = 12 out of 30.    . Arthritis   . Atherosclerosis of aorta ([place]) 10/05/2015  . Back pain 10/05/2015  . Bronchiectasis ([place]) 10/03/2017  . Calcification of native coronary artery 10/05/2015   Chest CT 2011.   . Carpal tunnel syndrome 06/29/2006   Qualifier: Diagnosis of  By: Benna Dunks    . Cervicalgia   . Cervicalgia 05/18/2009   Qualifier: Diagnosis of  By: Walker Kehr MD, Patrick Jupiter    . CHF (congestive heart failure) ([place])   . Chronic bilateral thoracic back pain 10/05/2015  . Chronic constipation   . Chronic diastolic heart failure ([place]) 09/01/2013  . Chronic pain syndrome 05/09/2014  . CKD (chronic kidney disease) stage 3, GFR 30-59 ml/min ([facility]) 09/03/2013  . Combined visual hearing impairment 11/12/2013  . Complete heart block ([place])   . Constipation   . Cryptogenic stroke ([place]) S/P  LOOP RECORDER IMPLANT   DX  01/2013  --  SYNCOPAL EPISODE --   EPISODIC  ATRIAL TACHY/ FIBULATION  AND PAUSES  . CYSTOCELE/RECTOCELE/PROLAPSE,UNSPEC. 06/29/2006   Qualifier: Diagnosis of  By: Benna Dunks    . Depressive disorder 06/29/2006       . Dilation of esophagus 06/16/2017   Barium Esophagram 06/25/17: Poor esophageal motility. Dilated esophagus with tertiary contractions.  Smooth narrowing distal esophagus similar to the prior study.  Moderate narrowing. Barium tablet passed through this area after several sips of water.  Marland Kitchen Displaced fracture of fifth metatarsal bone, right foot, subsequent encounter for fracture with routine healing 06/05/2017  . Diverticulosis of colon   . DIVERTICULOSIS OF COLON 06/29/2006   Qualifier: Diagnosis of  By: Benna Dunks    . Dry eyes   . Dysphagia 09/15/2015  . Fall from bed 03/17/2016  . Fatigue   . Food impaction of esophagus   . Frequency of urination   . Gait instability   . Generalized osteoarthritis of multiple sites 06/15/2017  . Glaucoma 04/18/2014  . Glaucoma of both eyes   . Gout 09/21/2013  . Hand Heaviness 03/17/2016  . Headache   . Hearing loss of aging 05/09/2014  . Hearing loss of both ears    no hearing aids  . HERNIA, INCISIONAL VENTRAL W/O OBST/GNGR 10/25/2006   Has been seen by surgery in the past and told that this is not dangerous  and should only be treated particularly bothersome    . History of acute gouty arthritis 09/21/2013  . History of colon polyps   . History of Food impaction of esophagus   . HTN (hypertension) 06/29/2006   Isolated Systolic Hypertension - Ambulatory BP  Monitoring result 4/28-29/2015   . Hyperlipidemia 02/27/2015  . Hyperplastic colon polyp 2006   colonoscope  . Hypertension   . Hypochloremia   . Hyponatremia   . Hypothyroidism   . Impaired mobility and ADLs 05/30/2014  . Influenza A 05/14/2016  . Insomnia   . INTERSTITIAL CYSTITIS 09/21/2009   Qualifier: Diagnosis of  By: Sarita Haver  MD, Coralyn Mark    . Loss of weight 04/05/2010   Qualifier: Diagnosis of  By: Walker Kehr  MD, Patrick Jupiter    . Lumbar stenosis   . Macular degeneration, age related, nonexudative 04/18/2014  . MASS, LUNG 04/05/2010   Qualifier: Diagnosis of  By: Walker Kehr MD, Patrick Jupiter    . Mobitz type 2 second degree heart block 08/29/2013  . Moderate dementia ([place])   . Narrowing of Distal Esophagus 06/16/2017   Barium Esophagram (06/15/17) Poor esophageal motility. Dilated esophagus with tertiary contractions.  Smooth narrowing distal esophagus similar to the prior study.  Moderate narrowing. Barium tablet passed through this area after several sips of water.   . OA (osteoarthritis)    RIGHT SHOULDER AC JOINT  . Orthostasis 04/28/2016  . OSTEOARTHRITIS, MULTI SITES 06/29/2006   Qualifier: Diagnosis of  By: Benna Dunks    . Osteoporosis   . OSTEOPOROSIS 03/20/2008   Qualifier: Diagnosis of  By: Jerline Pain MD, Anderson Malta    . Pacemaker   . 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 Peripheral neuropathy   . PERIPHERAL NEUROPATHY 03/29/2010   Qualifier: Diagnosis of  By: Walker Kehr MD, Patrick Jupiter    . Persistent headaches 09/06/2013  . Polymyalgia rheumatica ([place]) 11/08/2013  . Presbyesophagus (senile esophageal dysmotility) 06/16/2017  . Presence of permanent cardiac pacemaker   . Rib pain on left side 10/05/2015  . Right knee pain 01/15/2014  . Right rotator cuff tear   . Rotator cuff tear arthropathy 09/11/2012   Likely related to rotator cuff tendinopathy seen on musculoskeletal ultrasound, and GH DJD.  Complete tear of right supra and infraspinatus on MRI 10/2012     . S/P shoulder replacement 03/20/2015  . Shortness of breath 07/21/2014  . Shortness of breath dyspnea   . Spinal stenosis of lumbar region 10/03/2012   Patient has significant low back pain related to spinal stenosis and DJD, DDD.  We had a long discussion about treatment options  which are quite limited. She is likely not a good surgical candidate for laminectomy. I am not sure about the usefulness of epidural steroid injection. We'll plan on restarting meloxicam after a long discussion of the pros and cons and risks of chronic kidney disease.  The patient and her family feel that the benefit of good pain control is the small risk.  Additionally she will followup with her primary care provider for creatinine monitoring.  Will obtain physical therapy for TENS unit trial.  Will refer to pain management for consideration of epidural steroid injection as that worked well in the past for cervical pain Followup here as needed   . SUI (stress urinary incontinence, female)   . Syncope and collapse 02/09/2013  .  Urinary frequency 06/05/2015  . Urinary incontinence, mixed 06/29/2006   Qualifier: Diagnosis of  By: Benna Dunks    . UTI (urinary tract infection) 05/27/2016  . UTI (urinary tract infection) 05/2017  . Vertigo 10/18/2010  . Weakness 08/29/2013    Medications:  Apixaban 2.5 mg bid pta last 12/4 PM  Assessment: 84 y/o F with a h/o atrial fibrillation on Eliquis PTA admitted with possible gallstone pancreatitis to transition to heparin drip while NPO.   Goal of Therapy:  HL 0.3-0.7 APTT 66-102s Monitor platelets by anticoagulation protocol: Yes   Plan:  Start heparin infusion at 850 units/hr Monitor using aPTT until levels correlate Check aPTT in 8 hours  Continue to monitor H&H and platelets  Ulice Dash D 04/05/2018,9:46 PM

## 2018-04-05 NOTE — ED Notes (Signed)
Report given . Hospitalist  in room at this time.

## 2018-04-05 NOTE — H&P (Signed)
History and Physical   Jordan [name] KGM:[id number] DOB: October 05, 1919 DOA: 04/05/2018  Referring MD/NP/PA: Dr Johnney Killian  PCP: McDiarmid, Blane Ohara, MD   Outpatient Specialists: None   Patient coming from: Home  Chief Complaint: Abdominal Pain  HPI: Jordan [name] is a 84 y.o. female with medical history significant of Dementia, hypertension, bronchiectasis, diastolic dysfunction CHF with pacemaker in place, chronic constipation, chronic anticoagulation, who is Spanish-speaking only presenting to the ER with abdominal pain nausea vomiting.  Pain is 10 out of 10 in the epigastric region.  It radiates to the right upper quadrant associated with nausea.  No diarrhea.  It started today after she got up.  Mostly in the epigastric region but going to the right upper quadrant.  Patient has not vomited.  No prior pain like this.  She was seen in the ER and evaluated and found to have acute pancreatitis.  Suspected gallstone pancreatitis and she is being admitted for treatment.  ED Course: Temperature 9078 blood pressure 91/58 pulse 94 respiratory 25 oxygen sats 90% room air.  White count 13.9 hemoglobin 11.3 and platelet 149.  Electrolytes are within normal except for BUN of 25 and creatinine 1.50 calcium 8.8 and glucose 110.  Lactic acid of 2.73.  Lipase is 876.  LFTs are elevated with AST 231 ALT 135 and alkaline phosphatase 242 total bilirubin 4.0.  CT abdomen pelvis shows acute pancreatitis with no abscess or pseudocyst.  Distended gallbladder with large 3.3 cm stone, colonic diverticulosis numerous anterior abdominal wall fat with hernia and remote L2 compression fracture.  Patient is given IV morphine.  GI was consulted with surgery and they will see patient in the morning.  Review of Systems: As per HPI otherwise 10 point review of systems negative.    Past Medical History:  Diagnosis Date  . AKI (acute kidney injury) ([place])   . Alzheimer's dementia [facility]) 07/30/2013   04/17/14 MoCA - Blind (Spanish  version); 9 out of 22.  Correct MoCA = 12 out of 30.    . Arthritis   . Atherosclerosis of aorta ([place]) 10/05/2015  . Back pain 10/05/2015  . Bronchiectasis ([place]) 10/03/2017  . Calcification of native coronary artery 10/05/2015   Chest CT 2011.   . Carpal tunnel syndrome 06/29/2006   Qualifier: Diagnosis of  By: Benna Dunks    . Cervicalgia   . Cervicalgia 05/18/2009   Qualifier: Diagnosis of  By: Walker Kehr MD, Patrick Jupiter    . CHF (congestive heart failure) ([place])   . Chronic bilateral thoracic back pain 10/05/2015  . Chronic constipation   . Chronic diastolic heart failure ([place]) 09/01/2013  . Chronic pain syndrome 05/09/2014  . CKD (chronic kidney disease) stage 3, GFR 30-59 ml/min (HCC) 09/03/2013  . Combined visual hearing impairment 11/12/2013  . Complete heart block ([place])   . Constipation   . Cryptogenic stroke ([place]) S/P  LOOP RECORDER IMPLANT   DX  01/2013  --  SYNCOPAL EPISODE --  EPISODIC  ATRIAL TACHY/ FIBULATION  AND PAUSES  . CYSTOCELE/RECTOCELE/PROLAPSE,UNSPEC. 06/29/2006   Qualifier: Diagnosis of  By: Benna Dunks    . Depressive disorder 06/29/2006       . Dilation of esophagus 06/16/2017   Barium Esophagram 06/25/17: Poor esophageal motility. Dilated esophagus with tertiary contractions.  Smooth narrowing distal esophagus similar to the prior study.  Moderate narrowing. Barium tablet passed through this area after several sips of water.  Marland Kitchen Displaced fracture of fifth metatarsal bone, right foot, subsequent encounter for fracture with  routine healing 06/05/2017  . Diverticulosis of colon   . DIVERTICULOSIS OF COLON 06/29/2006   Qualifier: Diagnosis of  By: Benna Dunks    . Dry eyes   . Dysphagia 09/15/2015  . Fall from bed 03/17/2016  . Fatigue   . Food impaction of esophagus   . Frequency of urination   . Gait instability   . Generalized osteoarthritis of multiple sites 06/15/2017  . Glaucoma 04/18/2014  . Glaucoma of both eyes   . Gout 09/21/2013  . Hand Heaviness 03/17/2016  . Headache   .  Hearing loss of aging 05/09/2014  . Hearing loss of both ears    no hearing aids  . HERNIA, INCISIONAL VENTRAL W/O OBST/GNGR 10/25/2006   Has been seen by surgery in the past and told that this is not dangerous and should only be treated particularly bothersome    . History of acute gouty arthritis 09/21/2013  . History of colon polyps   . History of Food impaction of esophagus   . HTN (hypertension) 06/29/2006   Isolated Systolic Hypertension - Ambulatory BP  Monitoring result 4/28-29/2015   . Hyperlipidemia 02/27/2015  . Hyperplastic colon polyp 2006   colonoscope  . Hypertension   . Hypochloremia   . Hyponatremia   . Hypothyroidism   . Impaired mobility and ADLs 05/30/2014  . Influenza A 05/14/2016  . Insomnia   . INTERSTITIAL CYSTITIS 09/21/2009   Qualifier: Diagnosis of  By: Sarita Haver  MD, Coralyn Mark    . Loss of weight 04/05/2010   Qualifier: Diagnosis of  By: Walker Kehr MD, Patrick Jupiter    . Lumbar stenosis   . Macular degeneration, age related, nonexudative 04/18/2014  . MASS, LUNG 04/05/2010   Qualifier: Diagnosis of  By: Walker Kehr MD, Patrick Jupiter    . Mobitz type 2 second degree heart block 08/29/2013  . Moderate dementia ([place])   . Narrowing of Distal Esophagus 06/16/2017   Barium Esophagram (06/15/17) Poor esophageal motility. Dilated esophagus with tertiary contractions.  Smooth narrowing distal esophagus similar to the prior study.  Moderate narrowing. Barium tablet passed through this area after several sips of water.   . OA (osteoarthritis)    RIGHT SHOULDER AC JOINT  . Orthostasis 04/28/2016  . OSTEOARTHRITIS, MULTI SITES 06/29/2006   Qualifier: Diagnosis of  By: Benna Dunks    . Osteoporosis   . OSTEOPOROSIS 03/20/2008   Qualifier: Diagnosis of  By: Jerline Pain MD, Anderson Malta    . Pacemaker   . 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 Peripheral neuropathy   . PERIPHERAL NEUROPATHY 03/29/2010   Qualifier: Diagnosis of  By: Walker Kehr MD, Patrick Jupiter    . Persistent headaches 09/06/2013  . Polymyalgia rheumatica ([place]) 11/08/2013  . Presbyesophagus (senile esophageal dysmotility) 06/16/2017  . Presence of permanent cardiac pacemaker   . Rib pain on left side 10/05/2015  . Right knee pain 01/15/2014  . Right rotator cuff tear   . Rotator cuff tear arthropathy 09/11/2012   Likely related to rotator cuff tendinopathy seen on musculoskeletal ultrasound, and GH DJD.  Complete tear of right supra and infraspinatus on MRI 10/2012     . S/P shoulder replacement 03/20/2015  . Shortness of breath 07/21/2014  . Shortness of breath dyspnea   . Spinal stenosis of lumbar region 10/03/2012   Patient has significant low back pain related to spinal  stenosis and DJD, DDD.  We had a long discussion about treatment options which are quite limited. She is likely not a good surgical candidate for laminectomy. I am not sure about the usefulness of epidural steroid injection. We'll plan on restarting meloxicam after a long discussion of the pros and cons and risks of chronic kidney disease.  The patient and her family feel that the benefit of good pain control is the small risk.  Additionally she will followup with her primary care provider for creatinine monitoring.  Will obtain physical therapy for TENS unit trial.  Will refer to pain management for consideration of epidural steroid injection as that worked well in the past for cervical pain Followup here as needed   . SUI (stress urinary incontinence, female)   . Syncope and collapse 02/09/2013  . Urinary frequency 06/05/2015  . Urinary incontinence, mixed 06/29/2006   Qualifier: Diagnosis of  By: Benna Dunks    . UTI (urinary tract infection) 05/27/2016  . UTI (urinary tract infection) 05/2017  . Vertigo 10/18/2010  . Weakness 08/29/2013    Past Surgical History:  Procedure Laterality Date  . CARPAL  TUNNEL RELEASE Right   . ESOPHAGOGASTRODUODENOSCOPY N/A 09/15/2015   Procedure: ESOPHAGOGASTRODUODENOSCOPY (EGD);  Surgeon: Clarene Essex, MD;  Location: [facility] ENDOSCOPY;  Service: Endoscopy;  Laterality: N/A;  . EYE SURGERY     cataract, bilat.  Randolm Idol / REPLACE / REMOVE PACEMAKER    . LOOP RECORDER IMPLANT  02-08-13; 08-30-13   MDT LinQ implanted by Dr Lovena Le for syncope, explanted 08-30-13 after CHB identified  . LOOP RECORDER IMPLANT N/A 02/11/2013   Procedure: LOOP RECORDER IMPLANT;  Surgeon: Evans Lance, MD;  Location: [facility] CATH LAB;  Service: Cardiovascular;  Laterality: N/A;  . PACEMAKER INSERTION  08-30-2013   MDT ADDRL1 pacemaker implanted by Dr Rayann Heman for CHB  . PERMANENT PACEMAKER INSERTION N/A 08/30/2013   Procedure: PERMANENT PACEMAKER INSERTION;  Surgeon: Coralyn Mark, MD;  Location: [facility] CATH LAB;  Service: Cardiovascular;  Laterality: N/A;  . REVERSE SHOULDER ARTHROPLASTY Right 03/20/2015   Procedure: RIGHT REVERSE SHOULDER ARTHROPLASTY;  Surgeon: Netta Cedars, MD;  Location: [place];  Service: Orthopedics;  Laterality: Right;  . TOTAL ABDOMINAL HYSTERECTOMY W/ BILATERAL SALPINGOOPHORECTOMY  03-15-2005     reports that she has never smoked. She has never used smokeless tobacco. She reports that she does not drink alcohol or use drugs.  Allergies  Allergen Reactions  . Chlorthalidone Other (See Comments)    Hyponatremia  . Honey Bee Treatment [Bee Venom] Swelling and Rash  . Verapamil Other (See Comments)    Bradycardia - HR  35-45 on 40mg  TID  . Adhesive [Tape] Rash and Other (See Comments)    Skin irritation  . Amlodipine Other (See Comments)    Peripheral edema with 2.5mg  daily dose    Family History  Problem Relation Age of Onset  . Kidney disease Mother   . Heart attack Neg Hx   . Stroke Neg Hx      Prior to Admission medications   Medication Sig Start Date End Date Taking? Authorizing Provider  acetaminophen (TYLENOL) 650 MG CR tablet Take 650 mg by mouth 2 (two)  times daily as needed for pain.   Yes [provider]  allopurinol (ZYLOPRIM) 100 MG tablet take 1 tablet by mouth once daily 01/30/17  Yes McDiarmid, Blane Ohara, MD  apixaban (ELIQUIS) 2.5 MG TABS tablet Take 2.5 mg by mouth 2 (two) times daily.   Yes Adrian Prows,  MD  beta carotene w/minerals (OCUVITE) tablet Take 1 tablet by mouth daily. 04/17/14  Yes Cook, Jayce G, DO  COLCRYS 0.6 MG tablet TAKE 1 TABLET BY MOUTH TWICE DAILY 02/27/18  Yes McDiarmid, Blane Ohara, MD  cycloSPORINE (RESTASIS) 0.05 % ophthalmic emulsion Place 1 drop into both eyes at bedtime.   Yes [provider]  diclofenac sodium (VOLTAREN) 1 % GEL Apply 2 g topically 4 (four) times daily. Patient taking differently: Apply 2 g topically 4 (four) times daily as needed (pain).  07/10/17  Yes McDiarmid, Blane Ohara, MD  DULoxetine (CYMBALTA) 30 MG capsule Take 30 mg by mouth daily. 02/03/18  Yes [provider]  esomeprazole (NEXIUM) 40 MG capsule Take 1 capsule (40 mg total) by mouth daily. 06/05/17  Yes McDiarmid, Blane Ohara, MD  fesoterodine (TOVIAZ) 4 MG TB24 tablet Take 1 tablet (4 mg total) by mouth daily. 04/12/17  Yes McDiarmid, Blane Ohara, MD  furosemide (LASIX) 20 MG tablet Take 1 tablet (20 mg total) by mouth daily. Patient taking differently: Take 20 mg by mouth daily as needed for fluid.  06/05/17  Yes McDiarmid, Blane Ohara, MD  gabapentin (NEURONTIN) 100 MG capsule Take 2 capsules (200 mg total) by mouth 2 (two) times daily. 07/04/17  Yes McDiarmid, Blane Ohara, MD  levothyroxine (SYNTHROID, LEVOTHROID) 100 MCG tablet TAKE 1 TABLET BY MOUTH ONCE DAILY 11/29/17  Yes McDiarmid, Blane Ohara, MD  Lidocaine 0.5 % GEL Apply 1 application topically daily as needed (back pain).   Yes [provider]  metoprolol succinate (TOPROL-XL) 25 MG 24 hr tablet Take 12.5 mg by mouth daily.  09/21/15  Yes [provider]  TRAVATAN Z 0.004 % SOLN ophthalmic solution Place 1 drop into both eyes at bedtime.  02/20/13  Yes [provider]  traZODone (DESYREL) 50 MG tablet Take 50 mg by mouth at bedtime. 02/25/18  Yes [provider]  vitamin B-12 (CYANOCOBALAMIN) 1000 MCG tablet Take 1,000 mcg by mouth daily.   Yes [provider]  oxyCODONE (ROXICODONE) 5 MG immediate release tablet Take 0.5 tablets (2.5 mg total) by mouth every 4 (four) hours as needed for severe pain. Patient not taking: Reported on 05/31/2017 04/06/17   McDiarmid, Blane Ohara, MD  polyethylene glycol [facility] / Floria Raveling) packet take 17 grams by mouth once daily Patient taking differently: Take 17 g by mouth daily as needed for moderate constipation.  12/19/16   McDiarmid, Blane Ohara, MD    Physical Exam: Vitals:   04/05/18 1832 04/05/18 1930 04/05/18 2030 04/05/18 2052  BP: (!) 91/58 (!) 110/48 (!) 100/35   Pulse: 94 91 88   Resp: 19 16 17    Temp:      TempSrc:      SpO2: 90% 94% 92% 94%      Constitutional: NAD, calm, comfortable Vitals:   04/05/18 1832 04/05/18 1930 04/05/18 2030 04/05/18 2052  BP: (!) 91/58 (!) 110/48 (!) 100/35   Pulse: 94 91 88   Resp: 19 16 17    Temp:      TempSrc:      SpO2: 90% 94% 92% 94%   Eyes: PERRL, lids and conjunctivae normal ENMT: Mucous membranes are moist. Posterior pharynx clear of any exudate or lesions.Normal dentition.  Neck: normal, supple, no masses, no thyromegaly Respiratory: clear to auscultation bilaterally, no wheezing, no crackles. Normal respiratory effort. No accessory muscle use.  Cardiovascular: Regular rate and rhythm, no murmurs / rubs / gallops. No extremity edema. 2+ pedal pulses. No carotid  bruits.  Abdomen: Increased tenderness in the epigastric to right upper quadrant, no masses palpated. No hepatosplenomegaly. Bowel sounds positive.  Musculoskeletal: no clubbing / cyanosis. No joint deformity upper and lower extremities. Good ROM, no contractures. Normal muscle tone.  Skin: no rashes, lesions, ulcers. No induration Neurologic: CN 2-12 grossly intact. Sensation intact, DTR  normal. Strength 5/5 in all 4.  Psychiatric: Normal judgment and insight. Alert and oriented x 3. Normal mood.     Labs on Admission: I have personally reviewed following labs and imaging studies  CBC: Recent Labs  Lab 04/05/18 1624  WBC 13.9*  NEUTROABS 12.1*  HGB 11.3*  HCT 35.2*  MCV 105.4*  PLT 993*   Basic Metabolic Panel: Recent Labs  Lab 04/05/18 1624  NA 137  K 4.6  CL 99  CO2 26  GLUCOSE 110*  BUN 25*  CREATININE 1.50*  CALCIUM 8.8*   GFR: CrCl cannot be calculated (Unknown ideal weight.). Liver Function Tests: Recent Labs  Lab 04/05/18 1624  AST 231*  ALT 135*  ALKPHOS 242*  BILITOT 4.0*  PROT 5.7*  ALBUMIN 3.0*   Recent Labs  Lab 04/05/18 1624  LIPASE 876*   No results for input(s): AMMONIA in the last 168 hours. Coagulation Profile: Recent Labs  Lab 04/05/18 1624  INR 0.99   Cardiac Enzymes: No results for input(s): CKTOTAL, CKMB, CKMBINDEX, TROPONINI in the last 168 hours. BNP (last 3 results) No results for input(s): PROBNP in the last 8760 hours. HbA1C: No results for input(s): HGBA1C in the last 72 hours. CBG: No results for input(s): GLUCAP in the last 168 hours. Lipid Profile: No results for input(s): CHOL, HDL, LDLCALC, TRIG, CHOLHDL, LDLDIRECT in the last 72 hours. Thyroid Function Tests: No results for input(s): TSH, T4TOTAL, FREET4, T3FREE, THYROIDAB in the last 72 hours. Anemia Panel: No results for input(s): VITAMINB12, FOLATE, FERRITIN, TIBC, IRON, RETICCTPCT in the last 72 hours. Urine analysis:    Component Value Date/Time   COLORURINE YELLOW 05/31/2017 1454   APPEARANCEUR TURBID (A) 05/31/2017 1454   LABSPEC <1.005 (L) 05/31/2017 1454   PHURINE 6.0 05/31/2017 1454   GLUCOSEU NEGATIVE 05/31/2017 1454   HGBUR LARGE (A) 05/31/2017 1454   HGBUR small 12/17/2007 1124   BILIRUBINUR negative 02/08/2018 1640   BILIRUBINUR NEG 05/26/2016 1700   KETONESUR negative 02/08/2018 1640   KETONESUR NEGATIVE 05/31/2017 1454     PROTEINUR =30 (A) 02/08/2018 1640   PROTEINUR 30 (A) 05/31/2017 1454   UROBILINOGEN 0.2 02/08/2018 1640   UROBILINOGEN 0.2 08/29/2013 2142   NITRITE Negative 02/08/2018 1640   NITRITE NEGATIVE 05/31/2017 1454   LEUKOCYTESUR Small (1+) (A) 02/08/2018 1640   Sepsis Labs: @LABRCNTIP (procalcitonin:4,lacticidven:4) )No results found for this or any previous visit (from the past 240 hour(s)).   Radiological Exams on Admission: Ct Abdomen Pelvis Wo Contrast  Result Date: 04/05/2018 CLINICAL DATA:  Per patient's daughter pt woke up this am with upper abd pain and nausea Pain became diffuse today and still nauseated, has some back pain Originally ordered as a dissection but changed due to low GFR No oral Hx of hernia and hysterectomy No hx of CA EXAM: CT ABDOMEN AND PELVIS WITHOUT CONTRAST TECHNIQUE: Multidetector CT imaging of the abdomen and pelvis was performed following the standard protocol without IV contrast. COMPARISON:  CT of the abdomen and pelvis on 11/02/2015 FINDINGS: Lower chest: Scattered emphysematous changes are present. There is bronchiectasis in the LEFT and RIGHT LOWER lobes. Patient has cardiac lead to the RIGHT ventricle. There is  atherosclerotic calcification of the coronary arteries. Hepatobiliary: Liver attenuation is diffusely low, consistent with hepatic steatosis. No focal liver lesions are identified. The gallbladder is distended and contains a large gallstone which measures 2.4 x 3.3 centimeters. Pancreas: There is peripancreatic stranding which extends to the root of the mesentery and the anterior pararenal space. No focal fluid collections. No pseudocyst or abscess. Spleen: Normal in size without focal abnormality. Adrenals/Urinary Tract: Adrenals are normal in appearance. There is a benign cyst in the LOWER pole the RIGHT kidney. LEFT kidney is unremarkable. The ureters are normal in appearance. The bladder and visualized portion of the urethra are normal. Stomach/Bowel: The  stomach and small bowel loops are normal in appearance. There are scattered colonic diverticula but no acute diverticulitis. Significant rectosigmoid stool. No evidence for acute appendicitis. Vascular/Lymphatic: There is dense atherosclerotic calcification of the abdominal aorta not associated with aneurysm. No retroperitoneal or mesenteric adenopathy. Reproductive: Hysterectomy.  No adnexal mass. Other: Numerous fat containing anterior abdominal wall hernias are identified below the level of the umbilicus no evidence for associated bowel obstruction. Musculoskeletal: Significant degenerative changes in the lumbar spine, associated with scoliosis, convex to the LEFT. Remote wedge compression fracture of L2 with approximately 50% loss of vertebral body height. Extensive facet hypertrophy in the LOWER lumbar levels. IMPRESSION: 1. Acute pancreatitis.  No evidence for abscess or pseudocyst. 2. Hepatic steatosis. 3. Distended gallbladder with large 3.3 centimeter stone. 4. Colonic diverticulosis. 5.  Aortic atherosclerosis.  (ICD10-I70.0) 6. Hysterectomy. 7. Numerous anterior abdominal wall fat containing hernias. 8. Remote L2 compression fracture. 9. RIGHT renal cyst. 10. Bilateral LOWER lobe bronchiectasis. 11. Coronary artery disease. Electronically Signed   By: Nolon Nations M.D.   On: 04/05/2018 18:43    EKG: Independently reviewed.  It shows atrial paced rhythm with a rate of 91.  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  Patient has a pacemaker and cannot get  MRCP.  GI has been consulted.  Will place patient on bowel rest.  Nausea and vomiting control.  IV fluids, pain management.  Monitor lipase level.  #2 hypothyroidism: Patient may require continued levothyroxine IV if not improved within 48 hours.  #3 chronic anticoagulation: Initiate heparin in place of oral Eliquis.  Once p.o. intake restarts switch back.  #4 hypertension: Continue with blood pressure control.  #5 Alzheimer's dementia: Stable with no agitation.  #6 GERD: Continue with home regimen.  #7 chronic kidney disease stage III: BUN/creatinine appears to be close to baseline.  Continue monitoring.  DVT prophylaxis: Heparin Code Status: Full code Family Communication: Daughter and son-in-law Disposition Plan: To be determined Consults called: Gastroenterology Dr. Penelope Coop and Surgery Dr Lucia Gaskins Admission status: Inpatient  Severity of Illness: The appropriate patient status for this patient is INPATIENT. Inpatient status is judged to be reasonable and necessary in order to provide the required intensity of service to ensure the patient's safety. The patient's presenting symptoms, physical exam findings, and initial radiographic and laboratory data in the context of their chronic comorbidities is felt to place them at high risk for further clinical deterioration. Furthermore, it  is not anticipated that the patient will be medically stable for discharge from the hospital within 2 midnights of admission. The following factors support the patient status of inpatient.   " The patient's presenting symptoms include abdominal pain and nausea. " The worrisome physical exam findings include tenderness in the epigastric region. " The initial radiographic and laboratory data are worrisome because of CT and laboratory evidence of acute pancreatitis. " The chronic co-morbidities include dementia and status post pacemaker.   * I certify that at the point of admission it is my clinical judgment that  the patient will require inpatient hospital care spanning beyond 2 midnights from the point of admission due to high intensity of service, high risk for further deterioration and high frequency of surveillance required.Barbette Merino MD [facility] Pager [phone]  If 7PM-7AM, please contact night-coverage [email or web address] Password [email or web address]  04/05/2018, 9:09 PM

## 2018-04-05 NOTE — ED Triage Notes (Signed)
Pt arrived via EMS from home.the patient. Pt is alert and verbally responsive, (spanish speaking). Pt has been having c/o RLQ pain and has hx of abdominal hernia. Pt has had constipation x 1 -2 days, with nausea, no vomiting. Pt has PM per EMS 20 G Left forearm 150 IVF en-route   EMS v/s  bp 106/72, HR 98, RR 18 O2 sat.  98%

## 2018-04-05 NOTE — ED Provider Notes (Signed)
[facility] DEPT Provider Note   CSN: [id number] Arrival date & time: 04/05/18  1513     History   Chief Complaint Chief Complaint  Patient presents with  . Abdominal Pain    HPI Jordan HERNAN is a 84 y.o. female.  HPI Patient's daughter reports that the patient had a fairly sudden onset of pain after getting up today.  The pain is in epigastrium and under both ribs.  Is been very severe with the patient expressing a lot of pain at onset.  It hurts for her to take a deep breath.  She has not had recent cough or fever.  No recent sputum production.  She did not vomit but felt somewhat nauseated.  She has not had history of similar pain.  She patient's daughter reports that she had not had a bowel movement for 2 days so she tried a rectal suppository but that is made no difference. Past Medical History:  Diagnosis Date  . AKI (acute kidney injury) ([place])   . Alzheimer's dementia [facility]) 07/30/2013   04/17/14 MoCA - Blind (Spanish version); 9 out of 22.  Correct MoCA = 12 out of 30.    . Arthritis   . Atherosclerosis of aorta ([place]) 10/05/2015  . Back pain 10/05/2015  . Bronchiectasis ([place]) 10/03/2017  . Calcification of native coronary artery 10/05/2015   Chest CT 2011.   . Carpal tunnel syndrome 06/29/2006   Qualifier: Diagnosis of  By: Benna Dunks    . Cervicalgia   . Cervicalgia 05/18/2009   Qualifier: Diagnosis of  By: Walker Kehr MD, Patrick Jupiter    . CHF (congestive heart failure) ([place])   . Chronic bilateral thoracic back pain 10/05/2015  . Chronic constipation   . Chronic diastolic heart failure ([place]) 09/01/2013  . Chronic pain syndrome 05/09/2014  . CKD (chronic kidney disease) stage 3, GFR 30-59 ml/min (HCC) 09/03/2013  . Combined visual hearing impairment 11/12/2013  . Complete heart block ([place])   . Constipation   . Cryptogenic stroke ([place]) S/P  LOOP RECORDER IMPLANT   DX  01/2013  --  SYNCOPAL EPISODE --  EPISODIC  ATRIAL TACHY/ FIBULATION  AND PAUSES  .  CYSTOCELE/RECTOCELE/PROLAPSE,UNSPEC. 06/29/2006   Qualifier: Diagnosis of  By: Benna Dunks    . Depressive disorder 06/29/2006       . Dilation of esophagus 06/16/2017   Barium Esophagram 06/25/17: Poor esophageal motility. Dilated esophagus with tertiary contractions.  Smooth narrowing distal esophagus similar to the prior study.  Moderate narrowing. Barium tablet passed through this area after several sips of water.  Marland Kitchen Displaced fracture of fifth metatarsal bone, right foot, subsequent encounter for fracture with routine healing 06/05/2017  . Diverticulosis of colon   . DIVERTICULOSIS OF COLON 06/29/2006   Qualifier: Diagnosis of  By: Benna Dunks    . Dry eyes   . Dysphagia 09/15/2015  . Fall from bed 03/17/2016  . Fatigue   . Food impaction of esophagus   . Frequency of urination   . Gait instability   . Generalized osteoarthritis of multiple sites 06/15/2017  . Glaucoma 04/18/2014  . Glaucoma of both eyes   . Gout 09/21/2013  . Hand Heaviness 03/17/2016  . Headache   . Hearing loss of aging 05/09/2014  . Hearing loss of both ears    no hearing aids  . HERNIA, INCISIONAL VENTRAL W/O OBST/GNGR 10/25/2006   Has been seen by surgery in the past and told that this is not dangerous and should only  be treated particularly bothersome    . History of acute gouty arthritis 09/21/2013  . History of colon polyps   . History of Food impaction of esophagus   . HTN (hypertension) 06/29/2006   Isolated Systolic Hypertension - Ambulatory BP  Monitoring result 4/28-29/2015   . Hyperlipidemia 02/27/2015  . Hyperplastic colon polyp 2006   colonoscope  . Hypertension   . Hypochloremia   . Hyponatremia   . Hypothyroidism   . Impaired mobility and ADLs 05/30/2014  . Influenza A 05/14/2016  . Insomnia   . INTERSTITIAL CYSTITIS 09/21/2009   Qualifier: Diagnosis of  By: Sarita Haver  MD, Coralyn Mark    . Loss of weight 04/05/2010   Qualifier: Diagnosis of  By: Walker Kehr MD, Patrick Jupiter    . Lumbar stenosis   . Macular  degeneration, age related, nonexudative 04/18/2014  . MASS, LUNG 04/05/2010   Qualifier: Diagnosis of  By: Walker Kehr MD, Patrick Jupiter    . Mobitz type 2 second degree heart block 08/29/2013  . Moderate dementia ([place])   . Narrowing of Distal Esophagus 06/16/2017   Barium Esophagram (06/15/17) Poor esophageal motility. Dilated esophagus with tertiary contractions.  Smooth narrowing distal esophagus similar to the prior study.  Moderate narrowing. Barium tablet passed through this area after several sips of water.   . OA (osteoarthritis)    RIGHT SHOULDER AC JOINT  . Orthostasis 04/28/2016  . OSTEOARTHRITIS, MULTI SITES 06/29/2006   Qualifier: Diagnosis of  By: Benna Dunks    . Osteoporosis   . OSTEOPOROSIS 03/20/2008   Qualifier: Diagnosis of  By: Jerline Pain MD, Anderson Malta    . Pacemaker   . 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 Peripheral neuropathy   . PERIPHERAL NEUROPATHY 03/29/2010   Qualifier: Diagnosis of  By: Walker Kehr MD, Patrick Jupiter    . Persistent headaches 09/06/2013  . Polymyalgia rheumatica ([place]) 11/08/2013  . Presbyesophagus (senile esophageal dysmotility) 06/16/2017  . Presence of permanent cardiac pacemaker   . Rib pain on left side 10/05/2015  . Right knee pain 01/15/2014  . Right rotator cuff tear   . Rotator cuff tear arthropathy 09/11/2012   Likely related to rotator cuff tendinopathy seen on musculoskeletal ultrasound, and GH DJD.  Complete tear of right supra and infraspinatus on MRI 10/2012     . S/P shoulder replacement 03/20/2015  . Shortness of breath 07/21/2014  . Shortness of breath dyspnea   . Spinal stenosis of lumbar region 10/03/2012   Patient has significant low back pain related to spinal stenosis and DJD, DDD.  We had a long discussion about treatment options which are quite limited. She is likely not a good  surgical candidate for laminectomy. I am not sure about the usefulness of epidural steroid injection. We'll plan on restarting meloxicam after a long discussion of the pros and cons and risks of chronic kidney disease.  The patient and her family feel that the benefit of good pain control is the small risk.  Additionally she will followup with her primary care provider for creatinine monitoring.  Will obtain physical therapy for TENS unit trial.  Will refer to pain management for consideration of epidural steroid injection as that worked well in the past for cervical pain Followup here as needed   . SUI (stress urinary incontinence, female)   . Syncope and collapse 02/09/2013  . Urinary frequency  06/05/2015  . Urinary incontinence, mixed 06/29/2006   Qualifier: Diagnosis of  By: Benna Dunks    . UTI (urinary tract infection) 05/27/2016  . UTI (urinary tract infection) 05/2017  . Vertigo 10/18/2010  . Weakness 08/29/2013    Patient Active Problem List   Diagnosis Date Noted  . Acute pancreatitis 04/05/2018  . Generalized pain 02/09/2018  . Chronic kidney disease (CKD), stage IV (severe) ([place]) 02/09/2018  . Bronchiectasis ([place]) 10/03/2017  . History of atrial fibrillation 10/02/2017  . Presbyesophagus (senile esophageal dysmotility) 06/16/2017  . Narrowing of Distal Esophagus 06/16/2017  . Generalized osteoarthritis of multiple sites 06/15/2017  . Encounter for long-term opiate analgesic use 04/07/2017  . Weight loss, unintentional 08/12/2016  . UTI (urinary tract infection) 05/27/2016  . Idiopathic gout 05/05/2016  . Collapsed vertebra due to osteoporosis ([place]) 10/06/2015  . Atherosclerosis of aorta ([place]) 10/05/2015  . Calcification of native coronary artery 10/05/2015  . Chronic bilateral thoracic back pain 10/05/2015  . History of Food impaction of esophagus   . Esophageal dysmotility   . Hyperlipidemia 02/27/2015  . Diarrhea 08/21/2014  . GERD (gastroesophageal reflux disease)  07/25/2014  . Impaired mobility and ADLs 05/30/2014  . Chronic pain syndrome 05/09/2014  . Glaucoma 04/18/2014  . Macular degeneration, age related, nonexudative 04/18/2014  . Combined visual hearing impairment 11/12/2013  . History of acute gouty arthritis 09/21/2013  . 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 CKD (chronic kidney disease) stage 3, GFR 30-59 ml/min (HCC) 09/03/2013  . Chronic diastolic heart failure ([place]) 09/01/2013  . Frailty 08/29/2013  . Constipation 08/23/2013  . Alzheimer's dementia ([place]) 07/30/2013  . Gait instability 08/28/2012  . Xerostomia 01/24/2012  . Insomnia 06/14/2010  . Hereditary and idiopathic peripheral neuropathy 03/29/2010  . Osteoporosis 03/20/2008  . Hypothyroidism 06/29/2006  . Depressive disorder 06/29/2006  . HTN (hypertension) 06/29/2006  . CYSTOCELE/RECTOCELE/PROLAPSE,UNSPEC. 06/29/2006  . Urinary incontinence, mixed 06/29/2006  . Osteoarthritis of left hip 06/29/2006    Past Surgical History:  Procedure Laterality Date  . CARPAL TUNNEL RELEASE Right   . ESOPHAGOGASTRODUODENOSCOPY N/A 09/15/2015   Procedure: ESOPHAGOGASTRODUODENOSCOPY (EGD);  Surgeon: Clarene Essex, MD;  Location[facility] ENDOSCOPY;  Service: Endoscopy;  Laterality: N/A;  . EYE SURGERY     cataract, bilat.  Randolm Idol / REPLACE / REMOVE PACEMAKER    . LOOP RECORDER IMPLANT  02-08-13; 08-30-13   MDT LinQ implanted by Dr Lovena Le for syncope, explanted 08-30-13 after CHB identified  . LOOP RECORDER IMPLANT N/A 02/11/2013   Procedure: LOOP RECORDER IMPLANT;  Surgeon: Evans Lance, MD;  Location: [facility] CATH LAB;  Service: Cardiovascular;  Laterality: N/A;  . PACEMAKER INSERTION  08-30-2013   MDT [id number] pacemaker implanted by Dr Rayann Heman for CHB  . PERMANENT PACEMAKER INSERTION N/A 08/30/2013   Procedure: PERMANENT PACEMAKER INSERTION;  Surgeon: Coralyn Mark, MD;  Location: [facility] CATH LAB;  Service: Cardiovascular;  Laterality: N/A;  . REVERSE  SHOULDER ARTHROPLASTY Right 03/20/2015   Procedure: RIGHT REVERSE SHOULDER ARTHROPLASTY;  Surgeon: Netta Cedars, MD;  Location: [facility];  Service: Orthopedics;  Laterality: Right;  . TOTAL ABDOMINAL HYSTERECTOMY W/ BILATERAL SALPINGOOPHORECTOMY  03-15-2005     OB History    Gravida  0   Para  0   Term  0   Preterm  0   AB  0   Living        SAB  0   TAB  0   Ectopic  0   Multiple  Live Births               Home Medications    Prior to Admission medications   Medication Sig Start Date End Date Taking? Authorizing Provider  acetaminophen (TYLENOL) 650 MG CR tablet Take 650 mg by mouth 2 (two) times daily as needed for pain.   Yes [provider]  allopurinol (ZYLOPRIM) 100 MG tablet take 1 tablet by mouth once daily 01/30/17  Yes McDiarmid, Blane Ohara, MD  apixaban (ELIQUIS) 2.5 MG TABS tablet Take 2.5 mg by mouth 2 (two) times daily.   Yes Adrian Prows, MD  beta carotene w/minerals (OCUVITE) tablet Take 1 tablet by mouth daily. 04/17/14  Yes Cook, Jayce G, DO  COLCRYS 0.6 MG tablet TAKE 1 TABLET BY MOUTH TWICE DAILY 02/27/18  Yes McDiarmid, Blane Ohara, MD  cycloSPORINE (RESTASIS) 0.05 % ophthalmic emulsion Place 1 drop into both eyes at bedtime.   Yes [provider]  diclofenac sodium (VOLTAREN) 1 % GEL Apply 2 g topically 4 (four) times daily. Patient taking differently: Apply 2 g topically 4 (four) times daily as needed (pain).  07/10/17  Yes McDiarmid, Blane Ohara, MD  DULoxetine (CYMBALTA) 30 MG capsule Take 30 mg by mouth daily. 02/03/18  Yes [provider]  esomeprazole (NEXIUM) 40 MG capsule Take 1 capsule (40 mg total) by mouth daily. 06/05/17  Yes McDiarmid, Blane Ohara, MD  fesoterodine (TOVIAZ) 4 MG TB24 tablet Take 1 tablet (4 mg total) by mouth daily. 04/12/17  Yes McDiarmid, Blane Ohara, MD  furosemide (LASIX) 20 MG tablet Take 1 tablet (20 mg total) by mouth daily. Patient taking differently: Take 20 mg by mouth daily as needed for fluid.  06/05/17  Yes  McDiarmid, Blane Ohara, MD  gabapentin (NEURONTIN) 100 MG capsule Take 2 capsules (200 mg total) by mouth 2 (two) times daily. 07/04/17  Yes McDiarmid, Blane Ohara, MD  levothyroxine (SYNTHROID, LEVOTHROID) 100 MCG tablet TAKE 1 TABLET BY MOUTH ONCE DAILY 11/29/17  Yes McDiarmid, Blane Ohara, MD  Lidocaine 0.5 % GEL Apply 1 application topically daily as needed (back pain).   Yes [provider]  metoprolol succinate (TOPROL-XL) 25 MG 24 hr tablet Take 12.5 mg by mouth daily.  09/21/15  Yes [provider]  TRAVATAN Z 0.004 % SOLN ophthalmic solution Place 1 drop into both eyes at bedtime.  02/20/13  Yes [provider]  traZODone (DESYREL) 50 MG tablet Take 50 mg by mouth at bedtime. 02/25/18  Yes [provider]  vitamin B-12 (CYANOCOBALAMIN) 1000 MCG tablet Take 1,000 mcg by mouth daily.   Yes [provider]  oxyCODONE (ROXICODONE) 5 MG immediate release tablet Take 0.5 tablets (2.5 mg total) by mouth every 4 (four) hours as needed for severe pain. Patient not taking: Reported on 05/31/2017 04/06/17   McDiarmid, Blane Ohara, MD  polyethylene glycol [facility] / Floria Raveling) packet take 17 grams by mouth once daily Patient taking differently: Take 17 g by mouth daily as needed for moderate constipation.  12/19/16   McDiarmid, Blane Ohara, MD    Family History Family History  Problem Relation Age of Onset  . Kidney disease Mother   . Heart attack Neg Hx   . Stroke Neg Hx     Social History Social History   Tobacco Use  . Smoking status: Never Smoker  . Smokeless tobacco: Never Used  Substance Use Topics  . Alcohol use: No    Alcohol/week: 0.0 standard drinks  . Drug use: No  Allergies   Chlorthalidone; Honey bee treatment [bee venom]; Verapamil; Adhesive [tape]; and Amlodipine   Review of Systems Review of Systems 10 Systems reviewed and are negative for acute change except as noted in the HPI.   Physical Exam Updated Vital Signs BP (!) 175/68 (BP Location:  Left Arm)   Pulse 74   Temp (!) 97.5 F (36.4 C) (Oral)   Resp 20   Ht 5\' 2"  (1.575 m)   SpO2 96%   BMI 20.85 kg/m   Physical Exam  Constitutional:  Patient is alert and appropriate.  No respiratory distress.  She does appear to be in pain.  She is thin but well-nourished and well-developed.  HENT:  Head: Normocephalic and atraumatic.  Mouth/Throat: Oropharynx is clear and moist.  Eyes: Conjunctivae and EOM are normal.  Cardiovascular: Normal rate, regular rhythm, normal heart sounds and intact distal pulses.  Pulmonary/Chest: Effort normal and breath sounds normal.  Abdominal: Soft.  Patient endorses severe pain in the epigastrium.  Lower abdomen soft with mild tenderness.  Musculoskeletal: Normal range of motion. She exhibits no edema or tenderness.  Neurological: She is alert. No cranial nerve deficit. She exhibits normal muscle tone. Coordination normal.  Skin: Skin is warm and dry.  Psychiatric:  Patient is appropriate but slightly anxious and expressing pain.     ED Treatments / Results  Labs (all labs ordered are listed, but only abnormal results are displayed) Labs Reviewed  URINE CULTURE - Abnormal; Notable for the following components:      Result Value   Culture   (*)    Value: 10,000 COLONIES/mL ESCHERICHIA COLI 10,000 COLONIES/mL UNIDENTIFIED ORGANISM Performed at [facility], [place] [address], [place], [place] [address]    All other components within normal limits  LIPASE, BLOOD - Abnormal; Notable for the following components:   Lipase 876 (*)    All other components within normal limits  COMPREHENSIVE METABOLIC PANEL - Abnormal; Notable for the following components:   Glucose, Bld 110 (*)    BUN 25 (*)    Creatinine, Ser 1.50 (*)    Calcium 8.8 (*)    Total Protein 5.7 (*)    Albumin 3.0 (*)    AST 231 (*)    ALT 135 (*)    Alkaline Phosphatase 242 (*)    Total Bilirubin 4.0 (*)    GFR calc non Af Amer 29 (*)    GFR calc Af Amer 34 (*)     All other components within normal limits  URINALYSIS, ROUTINE W REFLEX MICROSCOPIC - Abnormal; Notable for the following components:   Color, Urine AMBER (*)    Hgb urine dipstick SMALL (*)    Protein, ur 30 (*)    Bacteria, UA RARE (*)    All other components within normal limits  CBC WITH DIFFERENTIAL/PLATELET - Abnormal; Notable for the following components:   WBC 13.9 (*)    RBC 3.34 (*)    Hemoglobin 11.3 (*)    HCT 35.2 (*)    MCV 105.4 (*)    Platelets 149 (*)    Neutro Abs 12.1 (*)    Abs Immature Granulocytes 0.10 (*)    All other components within normal limits  HEPARIN LEVEL (UNFRACTIONATED) - Abnormal; Notable for the following components:   Heparin Unfractionated 0.88 (*)    All other components within normal limits  CBC - Abnormal; Notable for the following components:   WBC 15.2 (*)    RBC 3.16 (*)  Hemoglobin 10.2 (*)    HCT 32.7 (*)    MCV 103.5 (*)    Platelets 122 (*)    All other components within normal limits  COMPREHENSIVE METABOLIC PANEL - Abnormal; Notable for the following components:   Glucose, Bld 119 (*)    BUN 28 (*)    Creatinine, Ser 1.47 (*)    Calcium 7.8 (*)    Total Protein 4.9 (*)    Albumin 2.6 (*)    AST 119 (*)    ALT 95 (*)    Alkaline Phosphatase 176 (*)    Total Bilirubin 1.9 (*)    GFR calc non Af Amer 30 (*)    GFR calc Af Amer 35 (*)    All other components within normal limits  APTT - Abnormal; Notable for the following components:   aPTT 106 (*)    All other components within normal limits  APTT - Abnormal; Notable for the following components:   aPTT 118 (*)    All other components within normal limits  CBC WITH DIFFERENTIAL/PLATELET - Abnormal; Notable for the following components:   WBC 13.2 (*)    RBC 3.09 (*)    Hemoglobin 10.3 (*)    HCT 33.3 (*)    MCV 107.8 (*)    Platelets 102 (*)    Neutro Abs 10.7 (*)    Monocytes Absolute 1.1 (*)    Abs Immature Granulocytes 0.14 (*)    All other components within  normal limits  CBC - Abnormal; Notable for the following components:   RBC 2.83 (*)    Hemoglobin 9.3 (*)    HCT 30.0 (*)    MCV 106.0 (*)    Platelets 103 (*)    All other components within normal limits  LIPASE, BLOOD - Abnormal; Notable for the following components:   Lipase 70 (*)    All other components within normal limits  COMPREHENSIVE METABOLIC PANEL - Abnormal; Notable for the following components:   BUN 26 (*)    Creatinine, Ser 1.26 (*)    Calcium 8.2 (*)    Total Protein 4.6 (*)    Albumin 2.6 (*)    AST 59 (*)    ALT 66 (*)    Alkaline Phosphatase 154 (*)    GFR calc non Af Amer 36 (*)    GFR calc Af Amer 42 (*)    All other components within normal limits  APTT - Abnormal; Notable for the following components:   aPTT 72 (*)    All other components within normal limits  I-STAT CG4 LACTIC ACID, ED - Abnormal; Notable for the following components:   Lactic Acid, Venous 2.73 (*)    All other components within normal limits  CULTURE, BLOOD (ROUTINE X 2)  CULTURE, BLOOD (ROUTINE X 2)  PROTIME-INR  APTT  LACTIC ACID, PLASMA  PROCALCITONIN  HEPARIN LEVEL (UNFRACTIONATED)  COMPREHENSIVE METABOLIC PANEL  I-STAT TROPONIN, ED  I-STAT CG4 LACTIC ACID, ED    EKG EKG Interpretation  Date/Time:  Thursday April 05 2018 16:57:29 EST Ventricular Rate:  91 PR Interval:    QRS Duration: 167 QT Interval:  428 QTC Calculation: 527 R Axis:   -74 Text Interpretation:  Atrial-sensed ventricular-paced rhythm No further analysis attempted due to paced rhythm Baseline wander in lead(s) I III aVL no change Confirmed by Charlesetta Shanks [phone]) on 04/05/2018 5:25:16 PM   Radiology No results found.  Procedures Procedures (including critical care time) CRITICAL CARE Performed by: Charlesetta Shanks  Total critical care time: 30 minutes  Critical care time was exclusive of separately billable procedures and treating other patients.  Critical care was necessary to treat  or prevent imminent or life-threatening deterioration.  Critical care was time spent personally by me on the following activities: development of treatment plan with patient and/or surrogate as well as nursing, discussions with consultants, evaluation of patient's response to treatment, examination of patient, obtaining history from patient or surrogate, ordering and performing treatments and interventions, ordering and review of laboratory studies, ordering and review of radiographic studies, pulse oximetry and re-evaluation of patient's condition. Medications Ordered in ED Medications  ondansetron (ZOFRAN) tablet 4 mg (has no administration in time range)    Or  ondansetron (ZOFRAN) injection 4 mg (has no administration in time range)  lip balm (CARMEX) ointment (has no administration in time range)  allopurinol (ZYLOPRIM) tablet 100 mg (has no administration in time range)  metoprolol succinate (TOPROL-XL) 24 hr tablet 12.5 mg (12.5 mg Oral Given 04/07/18 1315)  DULoxetine (CYMBALTA) DR capsule 30 mg (has no administration in time range)  traZODone (DESYREL) tablet 50 mg (50 mg Oral Given 04/07/18 2132)  pantoprazole (PROTONIX) EC tablet 40 mg (has no administration in time range)  apixaban (ELIQUIS) tablet 2.5 mg (2.5 mg Oral Given 04/07/18 2132)  fesoterodine (TOVIAZ) tablet 4 mg (has no administration in time range)  diclofenac sodium (VOLTAREN) 1 % transdermal gel 2 g (has no administration in time range)  acetaminophen (TYLENOL) tablet 650 mg (650 mg Oral Given 04/07/18 2023)  levothyroxine (SYNTHROID, LEVOTHROID) tablet 100 mcg (has no administration in time range)  hydrALAZINE (APRESOLINE) injection 5 mg (5 mg Intravenous Given 04/07/18 2023)  ondansetron (ZOFRAN) injection 4 mg (4 mg Intravenous Given 04/05/18 1622)  0.9 %  sodium chloride infusion ( Intravenous New Bag/Given 04/05/18 1619)  pantoprazole (PROTONIX) injection 40 mg (40 mg Intravenous Given 04/05/18 1622)  morphine 2 MG/ML  injection 2 mg (2 mg Intravenous Given 04/05/18 1622)  piperacillin-tazobactam (ZOSYN) IVPB 3.375 g (3.375 g Intravenous New Bag/Given 04/05/18 1901)  lactated ringers bolus 1,000 mL (1,000 mLs Intravenous New Bag/Given 04/05/18 1900)     Initial Impression / Assessment and Plan / ED Course  I have reviewed the triage vital signs and the nursing notes.  Pertinent labs & imaging results that were available during my care of the patient were reviewed by me and considered in my medical decision making (see chart for details).  Clinical Course as of Apr 08 13  Thu Apr 05, 2018  1633 Labs are not showing in process.  Have messaged nursing staff for update.   [MP]  1945 Consult ordered at 1851 and 1844.  Still pending, will notify clerk and reorder.   [MP]  1947 Order for consult to hospitalist added to order set as well.   [MP]  2001 Consult:Dr. Penelope Coop GI.    [MP]    Clinical Course User Index [MP] Charlesetta Shanks, MD    Patient presents with severe epigastric pain.  CT scan shows pancreatic inflammation and lipase is elevated.  Patient also has large gallstone and gallbladder distention.  Care initiated with fluid resuscitation, antibiotics and pain control.  Diagnostic finding is consistent with patient's presentation.  Plan will be for admission for surgical and GI consultations.  Final Clinical Impressions(s) / ED Diagnoses   Final diagnoses:  Acute biliary pancreatitis, unspecified complication status    ED Discharge Orders    None       Charlesetta Shanks, MD  04/08/18 0016  

## 2018-04-05 NOTE — ED Notes (Signed)
Bed: [facility]94 Expected date:  Expected time:  Means of arrival:  Comments: EMS-abdominal pain

## 2018-04-06 ENCOUNTER — Other Ambulatory Visit: Payer: Self-pay

## 2018-04-06 ENCOUNTER — Encounter (HOSPITAL_COMMUNITY): Payer: Self-pay

## 2018-04-06 LAB — CBC WITH DIFFERENTIAL/PLATELET
Abs Immature Granulocytes: 0.14 10*3/uL — ABNORMAL HIGH (ref 0.00–0.07)
BASOS ABS: 0 10*3/uL (ref 0.0–0.1)
Basophils Relative: 0 %
Eosinophils Absolute: 0 10*3/uL (ref 0.0–0.5)
Eosinophils Relative: 0 %
HCT: 33.3 % — ABNORMAL LOW (ref 36.0–46.0)
Hemoglobin: 10.3 g/dL — ABNORMAL LOW (ref 12.0–15.0)
Immature Granulocytes: 1 %
Lymphocytes Relative: 10 %
Lymphs Abs: 1.3 10*3/uL (ref 0.7–4.0)
MCH: 33.3 pg (ref 26.0–34.0)
MCHC: 30.9 g/dL (ref 30.0–36.0)
MCV: 107.8 fL — ABNORMAL HIGH (ref 80.0–100.0)
Monocytes Absolute: 1.1 10*3/uL — ABNORMAL HIGH (ref 0.1–1.0)
Monocytes Relative: 8 %
NEUTROS ABS: 10.7 10*3/uL — AB (ref 1.7–7.7)
Neutrophils Relative %: 81 %
Platelets: 102 10*3/uL — ABNORMAL LOW (ref 150–400)
RBC: 3.09 MIL/uL — ABNORMAL LOW (ref 3.87–5.11)
RDW: 15.1 % (ref 11.5–15.5)
WBC: 13.2 10*3/uL — ABNORMAL HIGH (ref 4.0–10.5)
nRBC: 0 % (ref 0.0–0.2)

## 2018-04-06 LAB — COMPREHENSIVE METABOLIC PANEL
ALT: 95 U/L — ABNORMAL HIGH (ref 0–44)
AST: 119 U/L — ABNORMAL HIGH (ref 15–41)
Albumin: 2.6 g/dL — ABNORMAL LOW (ref 3.5–5.0)
Alkaline Phosphatase: 176 U/L — ABNORMAL HIGH (ref 38–126)
Anion gap: 9 (ref 5–15)
BUN: 28 mg/dL — ABNORMAL HIGH (ref 8–23)
CO2: 26 mmol/L (ref 22–32)
Calcium: 7.8 mg/dL — ABNORMAL LOW (ref 8.9–10.3)
Chloride: 102 mmol/L (ref 98–111)
Creatinine, Ser: 1.47 mg/dL — ABNORMAL HIGH (ref 0.44–1.00)
GFR calc Af Amer: 35 mL/min — ABNORMAL LOW (ref >60)
GFR calc non Af Amer: 30 mL/min — ABNORMAL LOW (ref >60)
Glucose, Bld: 119 mg/dL — ABNORMAL HIGH (ref 70–99)
Potassium: 4.6 mmol/L (ref 3.5–5.1)
SODIUM: 137 mmol/L (ref 135–145)
Total Bilirubin: 1.9 mg/dL — ABNORMAL HIGH (ref 0.3–1.2)
Total Protein: 4.9 g/dL — ABNORMAL LOW (ref 6.5–8.1)

## 2018-04-06 LAB — CBC
HCT: 32.7 % — ABNORMAL LOW (ref 36.0–46.0)
Hemoglobin: 10.2 g/dL — ABNORMAL LOW (ref 12.0–15.0)
MCH: 32.3 pg (ref 26.0–34.0)
MCHC: 31.2 g/dL (ref 30.0–36.0)
MCV: 103.5 fL — ABNORMAL HIGH (ref 80.0–100.0)
Platelets: 122 10*3/uL — ABNORMAL LOW (ref 150–400)
RBC: 3.16 MIL/uL — ABNORMAL LOW (ref 3.87–5.11)
RDW: 14.8 % (ref 11.5–15.5)
WBC: 15.2 10*3/uL — ABNORMAL HIGH (ref 4.0–10.5)
nRBC: 0 % (ref 0.0–0.2)

## 2018-04-06 LAB — URINALYSIS, ROUTINE W REFLEX MICROSCOPIC
BILIRUBIN URINE: NEGATIVE
GLUCOSE, UA: NEGATIVE mg/dL
Ketones, ur: NEGATIVE mg/dL
LEUKOCYTES UA: NEGATIVE
NITRITE: NEGATIVE
PH: 5 (ref 5.0–8.0)
Protein, ur: 30 mg/dL — AB
SPECIFIC GRAVITY, URINE: 1.014 (ref 1.005–1.030)

## 2018-04-06 LAB — PROCALCITONIN: Procalcitonin: 20.87 ng/mL

## 2018-04-06 LAB — APTT
aPTT: 106 seconds — ABNORMAL HIGH (ref 24–36)
aPTT: 118 seconds — ABNORMAL HIGH (ref 24–36)

## 2018-04-06 LAB — LACTIC ACID, PLASMA: LACTIC ACID, VENOUS: 1.1 mmol/L (ref 0.5–1.9)

## 2018-04-06 MED ORDER — LEVOTHYROXINE SODIUM 100 MCG IV SOLR
50.0000 ug | Freq: Every day | INTRAVENOUS | Status: DC
  Administered 2018-04-06 – 2018-04-07 (×2): 50 ug via INTRAVENOUS
  Filled 2018-04-06 (×2): qty 5

## 2018-04-06 MED ORDER — HEPARIN (PORCINE) 25000 UT/250ML-% IV SOLN
700.0000 [IU]/h | INTRAVENOUS | Status: DC
  Administered 2018-04-07: 700 [IU]/h via INTRAVENOUS
  Filled 2018-04-06: qty 250

## 2018-04-06 NOTE — Progress Notes (Signed)
Patient and family were offered an iPad or translator on admission to [facility], and they refused. Pt does not speak english and family is translating.  I educated on benefits of a Optometrist and they still refused.

## 2018-04-06 NOTE — Progress Notes (Signed)
PROGRESS NOTE  Jordan [name] YQI:[id number] DOB: 10-13-1919 DOA: 04/05/2018 PCP: McDiarmid, Blane Ohara, MD  HPI/Recap of past 24 hours: Jordan [name] is a 84 y.o. female with medical history significant of Dementia, hypertension, bronchiectasis, diastolic dysfunction CHF with pacemaker in place, chronic constipation, chronic anticoagulation, who is Spanish-speaking only presenting to the ER with abdominal pain nausea vomiting.  Pain is 10 out of 10 in the epigastric region.  It radiates to the right upper quadrant associated with nausea.  No diarrhea.  It started today after she got up.  Mostly in the epigastric region but going to the right upper quadrant.  Patient has not vomited.  No prior pain like this.  She was seen in the ER and evaluated and found to have acute pancreatitis.  Suspected gallstone pancreatitis and she is being admitted for treatment.  04/06/2018: Patient seen and examined with her daughter at bedside.  Abdominal pain is still present but improving.  Lab studies remarkable for worsening leukocytosis.  Patient is on heparin drip for CVA prophylaxis in the setting of paroxysmal A. fib.  She is n.p.o and all her p.o. medications including Eliquis are being held.  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  And distended gallbladder with large 3.3 cm stone WBC trending up from 13 to 15 K However her LFTs are trending down Continue n.p.o. General surgery and GI following Unable to do MRCP due to  pacemaker On heparin drip due to possible procedure-none planned at this time Pain management and gentle IV fluid in place  Paroxysmal A. fib on Eliquis at home Currently rate controlled Continue to hold Eliquis On heparin drip due to possible procedure Defer to GI to restart Eliquis  Chronic diastolic CHF Last 2D echo done in 2016 revealed preserved LVEF with grade 1 diastolic dysfunction Strict I's and O's and daily weight Currently euvolemic  Gout No acute issues  Chronic anxiety/depression On Cymbalta and trazodone at night Resume when no longer n.p.o.  Hypothyroidism On levothyroxine; Give IV while n.p.o.  Status post pacemaker  RiskS: High risk for decompensation due to newly diagnosed acute gallstone pancreatitis with moderate to large gallstone in the setting of multiple comorbidities including A. fib on chronic anticoagulation, heart failure, and advanced age older than 16.    Patient will be transferred to telemetry unit due to her high risk for decompensation in the setting of significant multiple comorbidities and advanced age.  DVT prophylaxis: Heparin Code Status: Full code Family Communication: Daughter and son-in-law Disposition Plan:  Home when she can tolerate a soft diet and GI/general surgery signed off. Consults called: Gastroenterology Dr. Penelope Coop and Surgery Dr Lucia Gaskins Admission status: Inpatient   Objective: Vitals:   04/05/18 2052 04/05/18 2148 04/06/18 0505 04/06/18 1430  BP:  (!) 108/55 (!) 119/50 (!) 147/50  Pulse:  92 81 81  Resp:  18 17 20   Temp:  99.4 F (37.4 C) 98.4 F (36.9 C) 98.2 F (36.8 C)  TempSrc:  Oral Oral   SpO2: 94% 94% 100% 94%  Height:   5\' 2"  (  1.575 m)     Intake/Output Summary (Last 24 hours) at 04/06/2018 1622 Last data filed at 04/06/2018 1200 Gross per 24 hour  Intake 1118.61 ml  Output 0 ml  Net 1118.61 ml   There were no vitals filed for this visit.  Exam:  . General: 84 y.o. year-old female frail appears  uncomfortable due to abdominal pain.  Alert and interactive. . Cardiovascular: Regular rate and rhythm with no rubs or gallops.  No thyromegaly or JVD noted.   Marland Kitchen Respiratory: Clear to auscultation with no wheezes or rales. Good inspiratory effort. . Abdomen: Soft diffusely tender on palpation with hypoactive bowel sounds x4 quadrants. . Musculoskeletal: No lower extremity edema. 2/4 pulses in all 4 extremities. Marland Kitchen Psychiatry: Mood is appropriate for condition and setting   Data Reviewed: CBC: Recent Labs  Lab 04/05/18 1624 04/06/18 0608 04/06/18 1209  WBC 13.9* 15.2* 13.2*  NEUTROABS 12.1*  --  10.7*  HGB 11.3* 10.2* 10.3*  HCT 35.2* 32.7* 33.3*  MCV 105.4* 103.5* 107.8*  PLT 149* 122* 409*   Basic Metabolic Panel: Recent Labs  Lab 04/05/18 1624 04/06/18 0608  NA 137 137  K 4.6 4.6  CL 99 102  CO2 26 26  GLUCOSE 110* 119*  BUN 25* 28*  CREATININE 1.50* 1.47*  CALCIUM 8.8* 7.8*   GFR: CrCl cannot be calculated (Unknown ideal weight.). Liver Function Tests: Recent Labs  Lab 04/05/18 1624 04/06/18 0608  AST 231* 119*  ALT 135* 95*  ALKPHOS 242* 176*  BILITOT 4.0* 1.9*  PROT 5.7* 4.9*  ALBUMIN 3.0* 2.6*   Recent Labs  Lab 04/05/18 1624  LIPASE 876*   No results for input(s): AMMONIA in the last 168 hours. Coagulation Profile: Recent Labs  Lab 04/05/18 1624  INR 0.99   Cardiac Enzymes: No results for input(s): CKTOTAL, CKMB, CKMBINDEX, TROPONINI in the last 168 hours. BNP (last 3 results) No results for input(s): PROBNP in the last 8760 hours. HbA1C: No results for input(s): HGBA1C in the last 72 hours. CBG: No results for input(s): GLUCAP in the last 168 hours. Lipid Profile: No results for input(s): CHOL, HDL, LDLCALC, TRIG, CHOLHDL, LDLDIRECT in the last 72 hours. Thyroid Function Tests: No results for input(s): TSH, T4TOTAL, FREET4, T3FREE, THYROIDAB in the last 72 hours. Anemia Panel: No results for input(s): VITAMINB12, FOLATE, FERRITIN,  TIBC, IRON, RETICCTPCT in the last 72 hours. Urine analysis:    Component Value Date/Time   COLORURINE AMBER (A) 04/05/2018 1531   APPEARANCEUR CLEAR 04/05/2018 1531   LABSPEC 1.014 04/05/2018 1531   PHURINE 5.0 04/05/2018 1531   GLUCOSEU NEGATIVE 04/05/2018 1531   HGBUR SMALL (A) 04/05/2018 1531   HGBUR small 12/17/2007 1124   BILIRUBINUR NEGATIVE 04/05/2018 1531   BILIRUBINUR negative 02/08/2018 1640   BILIRUBINUR NEG 05/26/2016 1700   KETONESUR NEGATIVE 04/05/2018 1531   PROTEINUR 30 (A) 04/05/2018 1531   UROBILINOGEN 0.2 02/08/2018 1640   UROBILINOGEN 0.2 08/29/2013 2142   NITRITE NEGATIVE 04/05/2018 1531   LEUKOCYTESUR NEGATIVE 04/05/2018 1531   Sepsis Labs: @LABRCNTIP (procalcitonin:4,lacticidven:4)  ) Recent Results (from the past 240 hour(s))  Culture, blood (routine x 2)     Status: None (Preliminary result)   Collection Time: 04/06/18 12:09 PM  Result Value Ref Range Status   Specimen Description   Final    BLOOD RIGHT ARM Performed at [facility] [address], [place], [place] [address]    Special Requests   Final    BOTTLES DRAWN AEROBIC ONLY Blood Culture  results may not be optimal due to an inadequate volume of blood received in culture bottles Performed at [facility], [place] [address], [place], [place] [address]    Culture   Final    NO GROWTH <12 HOURS Performed at [facility] [address], [place], [place] [address]    Report Status PENDING  Incomplete  Culture, blood (routine x 2)     Status: None (Preliminary result)   Collection Time: 04/06/18 12:09 PM  Result Value Ref Range Status   Specimen Description   Final    BLOOD RIGHT HAND Performed at [facility] [address], [place], [place] [address]    Special Requests   Final    BOTTLES DRAWN AEROBIC AND ANAEROBIC Blood Culture results may not be optimal due to an inadequate volume of blood received in culture  bottles Performed at [facility] [address], [place], [place] [address]    Culture   Final    NO GROWTH <12 HOURS Performed at [facility] [address], [place], [place] [address]    Report Status PENDING  Incomplete      Studies: Ct Abdomen Pelvis Wo Contrast  Result Date: 04/05/2018 CLINICAL DATA:  Per patient's daughter pt woke up this am with upper abd pain and nausea Pain became diffuse today and still nauseated, has some back pain Originally ordered as a dissection but changed due to low GFR No oral Hx of hernia and hysterectomy No hx of CA EXAM: CT ABDOMEN AND PELVIS WITHOUT CONTRAST TECHNIQUE: Multidetector CT imaging of the abdomen and pelvis was performed following the standard protocol without IV contrast. COMPARISON:  CT of the abdomen and pelvis on 11/02/2015 FINDINGS: Lower chest: Scattered emphysematous changes are present. There is bronchiectasis in the LEFT and RIGHT LOWER lobes. Patient has cardiac lead to the RIGHT ventricle. There is atherosclerotic calcification of the coronary arteries. Hepatobiliary: Liver attenuation is diffusely low, consistent with hepatic steatosis. No focal liver lesions are identified. The gallbladder is distended and contains a large gallstone which measures 2.4 x 3.3 centimeters. Pancreas: There is peripancreatic stranding which extends to the root of the mesentery and the anterior pararenal space. No focal fluid collections. No pseudocyst or abscess. Spleen: Normal in size without focal abnormality. Adrenals/Urinary Tract: Adrenals are normal in appearance. There is a benign cyst in the LOWER pole the RIGHT kidney. LEFT kidney is unremarkable. The ureters are normal in appearance. The bladder and visualized portion of the urethra are normal. Stomach/Bowel: The stomach and small bowel loops are normal in appearance. There are scattered colonic diverticula but no acute diverticulitis. Significant rectosigmoid stool. No  evidence for acute appendicitis. Vascular/Lymphatic: There is dense atherosclerotic calcification of the abdominal aorta not associated with aneurysm. No retroperitoneal or mesenteric adenopathy. Reproductive: Hysterectomy.  No adnexal mass. Other: Numerous fat containing anterior abdominal wall hernias are identified below the level of the umbilicus no evidence for associated bowel obstruction. Musculoskeletal: Significant degenerative changes in the lumbar spine, associated with scoliosis, convex to the LEFT. Remote wedge compression fracture of L2 with approximately 50% loss of vertebral body height. Extensive facet hypertrophy in the LOWER lumbar levels. IMPRESSION: 1. Acute pancreatitis.  No evidence for abscess or pseudocyst. 2. Hepatic steatosis. 3. Distended gallbladder with large 3.3 centimeter stone. 4. Colonic diverticulosis. 5.  Aortic atherosclerosis.  (ICD10-I70.0) 6. Hysterectomy. 7. Numerous anterior abdominal wall fat containing hernias. 8. Remote L2 compression fracture. 9. RIGHT renal cyst.  10. Bilateral LOWER lobe bronchiectasis. 11. Coronary artery disease. Electronically Signed   By: Nolon Nations M.D.   On: 04/05/2018 18:43    Scheduled Meds:  Continuous Infusions: . dextrose 5% lactated ringers 75 mL/hr at 04/06/18 1147  . heparin 800 Units/hr (04/06/18 1022)     LOS: 1 day     Kayleen Memos, MD [facility] Pager [phone]  If 7PM-7AM, please contact night-coverage [email or web address] Password [email or web address] 04/06/2018, 4:22 PM

## 2018-04-06 NOTE — Care Management Note (Signed)
Case Management Note  Patient Details  Name: Jordan [name] MRN: [id number] Date of Birth: 1919/07/31  Subjective/Objective: Acute Pancreatitis.From home-Spanish speaking.CM referral to confirm CNA services @ home. I have called Son Carlos-[phone]-he deferred to dtr-[name]-[phone]-I wasn't able to leave a message-Carlos aware to have [name] to contact CM-await call back.                   Action/Plan:dc home.   Expected Discharge Date:  (unknown)               Expected Discharge Plan:  Home/Self Care  In-House Referral:     Discharge planning Services  CM Consult  Post Acute Care Choice:    Choice offered to:     DME Arranged:    DME Agency:     HH Arranged:    HH Agency:     Status of Service:  In process, will continue to follow  If discussed at Long Length of Stay Meetings, dates discussed:    Additional Comments:  Dessa Phi, RN 04/06/2018, 12:22 PM

## 2018-04-06 NOTE — Consult Note (Signed)
[facility] CONSULT Reason for consult: Gallstone pancreatitis Referring Physician: [facility] hospitalist.  PCP: Dr. Sherren Mocha McDiarmid.  Primary GI: Dr. Otho Bellows is an 84 y.o. female.  HPI: She speaks Spanish and is interviewed with her son interpreting.  She came to the emergency room with severe pain and lipase was markedly elevated at 876.  CT scan obtained showed peripancreatic fluid with no pseudocyst or abscess.  LFTs were elevated total bilirubin 4.0 down to 1.9 today.  The patient states that she is still having some pain per her son. Her other problems include a history of esophageal stricture with prior food impaction removed by Dr. Watt Climes, CHF, chronic pain syndrome, history of heart block requiring pacemaker, dementia secondary to Alzheimer's.  She also apparently has episodic atrial fibrillation.  She is not on anticoagulation. Per her son she is a nondrinker and has not started on any new medications.  Past Medical History:  Diagnosis Date  . AKI (acute kidney injury) ([place])   . Alzheimer's dementia [facility]) 07/30/2013   04/17/14 MoCA - Blind (Spanish version); 9 out of 22.  Correct MoCA = 12 out of 30.    . Arthritis   . Atherosclerosis of aorta ([place]) 10/05/2015  . Back pain 10/05/2015  . Bronchiectasis ([place]) 10/03/2017  . Calcification of native coronary artery 10/05/2015   Chest CT 2011.   . Carpal tunnel syndrome 06/29/2006   Qualifier: Diagnosis of  By: Benna Dunks    . Cervicalgia   . Cervicalgia 05/18/2009   Qualifier: Diagnosis of  By: Walker Kehr MD, Patrick Jupiter    . CHF (congestive heart failure) ([place])   . Chronic bilateral thoracic back pain 10/05/2015  . Chronic constipation   . Chronic diastolic heart failure ([place]) 09/01/2013  . Chronic pain syndrome 05/09/2014  . CKD (chronic kidney disease) stage 3, GFR 30-59 ml/min (HCC) 09/03/2013  . Combined visual hearing impairment 11/12/2013  . Complete heart block ([place])   . Constipation   . Cryptogenic stroke ([place]) S/P  LOOP RECORDER  IMPLANT   DX  01/2013  --  SYNCOPAL EPISODE --  EPISODIC  ATRIAL TACHY/ FIBULATION  AND PAUSES  . CYSTOCELE/RECTOCELE/PROLAPSE,UNSPEC. 06/29/2006   Qualifier: Diagnosis of  By: Benna Dunks    . Depressive disorder 06/29/2006       . Dilation of esophagus 06/16/2017   Barium Esophagram 06/25/17: Poor esophageal motility. Dilated esophagus with tertiary contractions.  Smooth narrowing distal esophagus similar to the prior study.  Moderate narrowing. Barium tablet passed through this area after several sips of water.  Marland Kitchen Displaced fracture of fifth metatarsal bone, right foot, subsequent encounter for fracture with routine healing 06/05/2017  . Diverticulosis of colon   . DIVERTICULOSIS OF COLON 06/29/2006   Qualifier: Diagnosis of  By: Benna Dunks    . Dry eyes   . Dysphagia 09/15/2015  . Fall from bed 03/17/2016  . Fatigue   . Food impaction of esophagus   . Frequency of urination   . Gait instability   . Generalized osteoarthritis of multiple sites 06/15/2017  . Glaucoma 04/18/2014  . Glaucoma of both eyes   . Gout 09/21/2013  . Hand Heaviness 03/17/2016  . Headache   . Hearing loss of aging 05/09/2014  . Hearing loss of both ears    no hearing aids  . HERNIA, INCISIONAL VENTRAL W/O OBST/GNGR 10/25/2006   Has been seen by surgery in the past and told that this is not dangerous and should only be treated particularly bothersome    .  History of acute gouty arthritis 09/21/2013  . History of colon polyps   . History of Food impaction of esophagus   . HTN (hypertension) 06/29/2006   Isolated Systolic Hypertension - Ambulatory BP  Monitoring result 4/28-29/2015   . Hyperlipidemia 02/27/2015  . Hyperplastic colon polyp 2006   colonoscope  . Hypertension   . Hypochloremia   . Hyponatremia   . Hypothyroidism   . Impaired mobility and ADLs 05/30/2014  . Influenza A 05/14/2016  . Insomnia   . INTERSTITIAL CYSTITIS 09/21/2009   Qualifier: Diagnosis of  By: Sarita Haver  MD, Coralyn Mark    . Loss of weight  04/05/2010   Qualifier: Diagnosis of  By: Walker Kehr MD, Patrick Jupiter    . Lumbar stenosis   . Macular degeneration, age related, nonexudative 04/18/2014  . MASS, LUNG 04/05/2010   Qualifier: Diagnosis of  By: Walker Kehr MD, Patrick Jupiter    . Mobitz type 2 second degree heart block 08/29/2013  . Moderate dementia ([place])   . Narrowing of Distal Esophagus 06/16/2017   Barium Esophagram (06/15/17) Poor esophageal motility. Dilated esophagus with tertiary contractions.  Smooth narrowing distal esophagus similar to the prior study.  Moderate narrowing. Barium tablet passed through this area after several sips of water.   . OA (osteoarthritis)    RIGHT SHOULDER AC JOINT  . Orthostasis 04/28/2016  . OSTEOARTHRITIS, MULTI SITES 06/29/2006   Qualifier: Diagnosis of  By: Benna Dunks    . Osteoporosis   . OSTEOPOROSIS 03/20/2008   Qualifier: Diagnosis of  By: Jerline Pain MD, Anderson Malta    . Pacemaker   . 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 Peripheral neuropathy   . PERIPHERAL NEUROPATHY 03/29/2010   Qualifier: Diagnosis of  By: Walker Kehr MD, Patrick Jupiter    . Persistent headaches 09/06/2013  . Polymyalgia rheumatica ([place]) 11/08/2013  . Presbyesophagus (senile esophageal dysmotility) 06/16/2017  . Presence of permanent cardiac pacemaker   . Rib pain on left side 10/05/2015  . Right knee pain 01/15/2014  . Right rotator cuff tear   . Rotator cuff tear arthropathy 09/11/2012   Likely related to rotator cuff tendinopathy seen on musculoskeletal ultrasound, and GH DJD.  Complete tear of right supra and infraspinatus on MRI 10/2012     . S/P shoulder replacement 03/20/2015  . Shortness of breath 07/21/2014  . Shortness of breath dyspnea   . Spinal stenosis of lumbar region 10/03/2012   Patient has significant low back pain related to spinal stenosis and DJD, DDD.  We  had a long discussion about treatment options which are quite limited. She is likely not a good surgical candidate for laminectomy. I am not sure about the usefulness of epidural steroid injection. We'll plan on restarting meloxicam after a long discussion of the pros and cons and risks of chronic kidney disease.  The patient and her family feel that the benefit of good pain control is the small risk.  Additionally she will followup with her primary care provider for creatinine monitoring.  Will obtain physical therapy for TENS unit trial.  Will refer to pain management for consideration of epidural steroid injection as that worked well in the past for cervical pain Followup here as needed   . SUI (stress urinary incontinence, female)   . Syncope and collapse 02/09/2013  . Urinary frequency 06/05/2015  . Urinary incontinence, mixed 06/29/2006  Qualifier: Diagnosis of  By: Benna Dunks    . UTI (urinary tract infection) 05/27/2016  . UTI (urinary tract infection) 05/2017  . Vertigo 10/18/2010  . Weakness 08/29/2013    Past Surgical History:  Procedure Laterality Date  . CARPAL TUNNEL RELEASE Right   . ESOPHAGOGASTRODUODENOSCOPY N/A 09/15/2015   Procedure: ESOPHAGOGASTRODUODENOSCOPY (EGD);  Surgeon: Clarene Essex, MD;  Location[facility] ENDOSCOPY;  Service: Endoscopy;  Laterality: N/A;  . EYE SURGERY     cataract, bilat.  Randolm Idol / REPLACE / REMOVE PACEMAKER    . LOOP RECORDER IMPLANT  02-08-13; 08-30-13   MDT LinQ implanted by Dr Lovena Le for syncope, explanted 08-30-13 after CHB identified  . LOOP RECORDER IMPLANT N/A 02/11/2013   Procedure: LOOP RECORDER IMPLANT;  Surgeon: Evans Lance, MD;  Location: [facility] CATH LAB;  Service: Cardiovascular;  Laterality: N/A;  . PACEMAKER INSERTION  08-30-2013   MDT [id number] pacemaker implanted by Dr Rayann Heman for CHB  . PERMANENT PACEMAKER INSERTION N/A 08/30/2013   Procedure: PERMANENT PACEMAKER INSERTION;  Surgeon: Coralyn Mark, MD;  Location: [facility] CATH LAB;  Service:  Cardiovascular;  Laterality: N/A;  . REVERSE SHOULDER ARTHROPLASTY Right 03/20/2015   Procedure: RIGHT REVERSE SHOULDER ARTHROPLASTY;  Surgeon: Netta Cedars, MD;  Location: [facility];  Service: Orthopedics;  Laterality: Right;  . TOTAL ABDOMINAL HYSTERECTOMY W/ BILATERAL SALPINGOOPHORECTOMY  03-15-2005    Family History  Problem Relation Age of Onset  . Kidney disease Mother   . Heart attack Neg Hx   . Stroke Neg Hx     Social History:  reports that she has never smoked. She has never used smokeless tobacco. She reports that she does not drink alcohol or use drugs.  Allergies:  Allergies  Allergen Reactions  . Chlorthalidone Other (See Comments)    Hyponatremia  . Honey Bee Treatment [Bee Venom] Swelling and Rash  . Verapamil Other (See Comments)    Bradycardia - HR  35-45 on 40mg  TID  . Adhesive [Tape] Rash and Other (See Comments)    Skin irritation  . Amlodipine Other (See Comments)    Peripheral edema with 2.5mg  daily dose    Medications; Prior to Admission medications   Medication Sig Start Date End Date Taking? Authorizing Provider  acetaminophen (TYLENOL) 650 MG CR tablet Take 650 mg by mouth 2 (two) times daily as needed for pain.   Yes [provider]  allopurinol (ZYLOPRIM) 100 MG tablet take 1 tablet by mouth once daily 01/30/17  Yes McDiarmid, Blane Ohara, MD  apixaban (ELIQUIS) 2.5 MG TABS tablet Take 2.5 mg by mouth 2 (two) times daily.   Yes Adrian Prows, MD  beta carotene w/minerals (OCUVITE) tablet Take 1 tablet by mouth daily. 04/17/14  Yes Cook, Jayce G, DO  COLCRYS 0.6 MG tablet TAKE 1 TABLET BY MOUTH TWICE DAILY 02/27/18  Yes McDiarmid, Blane Ohara, MD  cycloSPORINE (RESTASIS) 0.05 % ophthalmic emulsion Place 1 drop into both eyes at bedtime.   Yes [provider]  diclofenac sodium (VOLTAREN) 1 % GEL Apply 2 g topically 4 (four) times daily. Patient taking differently: Apply 2 g topically 4 (four) times daily as needed (pain).  07/10/17  Yes McDiarmid,  Blane Ohara, MD  DULoxetine (CYMBALTA) 30 MG capsule Take 30 mg by mouth daily. 02/03/18  Yes [provider]  esomeprazole (NEXIUM) 40 MG capsule Take 1 capsule (40 mg total) by mouth daily. 06/05/17  Yes McDiarmid, Blane Ohara, MD  fesoterodine (TOVIAZ) 4 MG TB24 tablet Take 1 tablet (4  mg total) by mouth daily. 04/12/17  Yes McDiarmid, Blane Ohara, MD  furosemide (LASIX) 20 MG tablet Take 1 tablet (20 mg total) by mouth daily. Patient taking differently: Take 20 mg by mouth daily as needed for fluid.  06/05/17  Yes McDiarmid, Blane Ohara, MD  gabapentin (NEURONTIN) 100 MG capsule Take 2 capsules (200 mg total) by mouth 2 (two) times daily. 07/04/17  Yes McDiarmid, Blane Ohara, MD  levothyroxine (SYNTHROID, LEVOTHROID) 100 MCG tablet TAKE 1 TABLET BY MOUTH ONCE DAILY 11/29/17  Yes McDiarmid, Blane Ohara, MD  Lidocaine 0.5 % GEL Apply 1 application topically daily as needed (back pain).   Yes [provider]  metoprolol succinate (TOPROL-XL) 25 MG 24 hr tablet Take 12.5 mg by mouth daily.  09/21/15  Yes [provider]  TRAVATAN Z 0.004 % SOLN ophthalmic solution Place 1 drop into both eyes at bedtime.  02/20/13  Yes [provider]  traZODone (DESYREL) 50 MG tablet Take 50 mg by mouth at bedtime. 02/25/18  Yes [provider]  vitamin B-12 (CYANOCOBALAMIN) 1000 MCG tablet Take 1,000 mcg by mouth daily.   Yes [provider]  oxyCODONE (ROXICODONE) 5 MG immediate release tablet Take 0.5 tablets (2.5 mg total) by mouth every 4 (four) hours as needed for severe pain. Patient not taking: Reported on 05/31/2017 04/06/17   McDiarmid, Blane Ohara, MD  polyethylene glycol [facility] / Floria Raveling) packet take 17 grams by mouth once daily Patient taking differently: Take 17 g by mouth daily as needed for moderate constipation.  12/19/16   McDiarmid, Blane Ohara, MD    PRN Meds HYDROmorphone (DILAUDID) injection, lip balm, morphine injection, ondansetron **OR** ondansetron (ZOFRAN) IV Results for orders  placed or performed during the hospital encounter of 04/05/18 (from the past 48 hour(s))  Lipase, blood     Status: Abnormal   Collection Time: 04/05/18  4:24 PM  Result Value Ref Range   Lipase 876 (H) 11 - 51 U/L    Comment: RESULTS CONFIRMED BY MANUAL DILUTION Performed at [facility] [address] [place], [place] [address]   Comprehensive metabolic panel     Status: Abnormal   Collection Time: 04/05/18  4:24 PM  Result Value Ref Range   Sodium 137 135 - 145 mmol/L   Potassium 4.6 3.5 - 5.1 mmol/L   Chloride 99 98 - 111 mmol/L   CO2 26 22 - 32 mmol/L   Glucose, Bld 110 (H) 70 - 99 mg/dL   BUN 25 (H) 8 - 23 mg/dL   Creatinine, Ser 1.50 (H) 0.44 - 1.00 mg/dL   Calcium 8.8 (L) 8.9 - 10.3 mg/dL   Total Protein 5.7 (L) 6.5 - 8.1 g/dL   Albumin 3.0 (L) 3.5 - 5.0 g/dL   AST 231 (H) 15 - 41 U/L   ALT 135 (H) 0 - 44 U/L   Alkaline Phosphatase 242 (H) 38 - 126 U/L   Total Bilirubin 4.0 (H) 0.3 - 1.2 mg/dL   GFR calc non Af Amer 29 (L) >60 mL/min   GFR calc Af Amer 34 (L) >60 mL/min   Anion gap 12 5 - 15    Comment: Performed at [facility], [place] [address], [place], [place] [address]  CBC with Differential     Status: Abnormal   Collection Time: 04/05/18  4:24 PM  Result Value Ref Range   WBC 13.9 (H) 4.0 - 10.5 K/uL   RBC 3.34 (L) 3.87 - 5.11 MIL/uL   Hemoglobin  11.3 (L) 12.0 - 15.0 g/dL   HCT 35.2 (L) 36.0 - 46.0 %   MCV 105.4 (H) 80.0 - 100.0 fL   MCH 33.8 26.0 - 34.0 pg   MCHC 32.1 30.0 - 36.0 g/dL   RDW 14.7 11.5 - 15.5 %   Platelets 149 (L) 150 - 400 K/uL   nRBC 0.0 0.0 - 0.2 %   Neutrophils Relative % 87 %   Neutro Abs 12.1 (H) 1.7 - 7.7 K/uL   Lymphocytes Relative 5 %   Lymphs Abs 0.7 0.7 - 4.0 K/uL   Monocytes Relative 7 %   Monocytes Absolute 1.0 0.1 - 1.0 K/uL   Eosinophils Relative 0 %   Eosinophils Absolute 0.0 0.0 - 0.5 K/uL   Basophils Relative 0 %   Basophils Absolute 0.0 0.0 - 0.1 K/uL   Immature Granulocytes 1 %    Abs Immature Granulocytes 0.10 (H) 0.00 - 0.07 K/uL    Comment: Performed at [facility], [place] [address], [place], [place] [address]  Protime-INR     Status: None   Collection Time: 04/05/18  4:24 PM  Result Value Ref Range   Prothrombin Time 13.0 11.4 - 15.2 seconds   INR 0.99     Comment: Performed at [facility], [place] [address], [place], [place] [address]  I-stat troponin, ED     Status: None   Collection Time: 04/05/18  4:39 PM  Result Value Ref Range   Troponin i, poc 0.02 0.00 - 0.08 ng/mL   Comment 3            Comment: Due to the release kinetics of cTnI, a negative result within the first hours of the onset of symptoms does not rule out myocardial infarction with certainty. If myocardial infarction is still suspected, repeat the test at appropriate intervals.   I-Stat CG4 Lactic Acid, ED     Status: Abnormal   Collection Time: 04/05/18  4:40 PM  Result Value Ref Range   Lactic Acid, Venous 2.73 (HH) 0.5 - 1.9 mmol/L   Comment NOTIFIED PHYSICIAN   I-Stat CG4 Lactic Acid, ED     Status: None   Collection Time: 04/05/18  8:45 PM  Result Value Ref Range   Lactic Acid, Venous 1.55 0.5 - 1.9 mmol/L  APTT     Status: None   Collection Time: 04/05/18 10:57 PM  Result Value Ref Range   aPTT 34 24 - 36 seconds    Comment: Performed at [facility], [place] [address], [place], [place] [address]  Heparin level (unfractionated)     Status: Abnormal   Collection Time: 04/05/18 10:57 PM  Result Value Ref Range   Heparin Unfractionated 0.88 (H) 0.30 - 0.70 IU/mL    Comment: (NOTE) If heparin results are below expected values, and patient dosage has  been confirmed, suggest follow up testing of antithrombin III levels. Performed at [facility], [place] [address] [place], [place] [address]   CBC     Status: Abnormal   Collection Time: 04/06/18  6:08 AM  Result Value Ref Range   WBC 15.2 (H) 4.0 - 10.5  K/uL   RBC 3.16 (L) 3.87 - 5.11 MIL/uL   Hemoglobin 10.2 (L) 12.0 - 15.0 g/dL   HCT 32.7 (L) 36.0 - 46.0 %   MCV 103.5 (H) 80.0 - 100.0 fL   MCH 32.3 26.0 - 34.0 pg   MCHC 31.2 30.0 - 36.0 g/dL   RDW 14.8  11.5 - 15.5 %   Platelets 122 (L) 150 - 400 K/uL   nRBC 0.0 0.0 - 0.2 %    Comment: Performed at [facility], [place] [address], [place], [place] [address]  Comprehensive metabolic panel     Status: Abnormal   Collection Time: 04/06/18  6:08 AM  Result Value Ref Range   Sodium 137 135 - 145 mmol/L   Potassium 4.6 3.5 - 5.1 mmol/L   Chloride 102 98 - 111 mmol/L   CO2 26 22 - 32 mmol/L   Glucose, Bld 119 (H) 70 - 99 mg/dL   BUN 28 (H) 8 - 23 mg/dL   Creatinine, Ser 1.47 (H) 0.44 - 1.00 mg/dL   Calcium 7.8 (L) 8.9 - 10.3 mg/dL   Total Protein 4.9 (L) 6.5 - 8.1 g/dL   Albumin 2.6 (L) 3.5 - 5.0 g/dL   AST 119 (H) 15 - 41 U/L   ALT 95 (H) 0 - 44 U/L   Alkaline Phosphatase 176 (H) 38 - 126 U/L   Total Bilirubin 1.9 (H) 0.3 - 1.2 mg/dL   GFR calc non Af Amer 30 (L) >60 mL/min   GFR calc Af Amer 35 (L) >60 mL/min   Anion gap 9 5 - 15    Comment: Performed at [facility], [place] [address] [place], [place] [address]    Ct Abdomen Pelvis Wo Contrast  Result Date: 04/05/2018 CLINICAL DATA:  Per patient's daughter pt woke up this am with upper abd pain and nausea Pain became diffuse today and still nauseated, has some back pain Originally ordered as a dissection but changed due to low GFR No oral Hx of hernia and hysterectomy No hx of CA EXAM: CT ABDOMEN AND PELVIS WITHOUT CONTRAST TECHNIQUE: Multidetector CT imaging of the abdomen and pelvis was performed following the standard protocol without IV contrast. COMPARISON:  CT of the abdomen and pelvis on 11/02/2015 FINDINGS: Lower chest: Scattered emphysematous changes are present. There is bronchiectasis in the LEFT and RIGHT LOWER lobes. Patient has cardiac lead to the RIGHT ventricle. There is  atherosclerotic calcification of the coronary arteries. Hepatobiliary: Liver attenuation is diffusely low, consistent with hepatic steatosis. No focal liver lesions are identified. The gallbladder is distended and contains a large gallstone which measures 2.4 x 3.3 centimeters. Pancreas: There is peripancreatic stranding which extends to the root of the mesentery and the anterior pararenal space. No focal fluid collections. No pseudocyst or abscess. Spleen: Normal in size without focal abnormality. Adrenals/Urinary Tract: Adrenals are normal in appearance. There is a benign cyst in the LOWER pole the RIGHT kidney. LEFT kidney is unremarkable. The ureters are normal in appearance. The bladder and visualized portion of the urethra are normal. Stomach/Bowel: The stomach and small bowel loops are normal in appearance. There are scattered colonic diverticula but no acute diverticulitis. Significant rectosigmoid stool. No evidence for acute appendicitis. Vascular/Lymphatic: There is dense atherosclerotic calcification of the abdominal aorta not associated with aneurysm. No retroperitoneal or mesenteric adenopathy. Reproductive: Hysterectomy.  No adnexal mass. Other: Numerous fat containing anterior abdominal wall hernias are identified below the level of the umbilicus no evidence for associated bowel obstruction. Musculoskeletal: Significant degenerative changes in the lumbar spine, associated with scoliosis, convex to the LEFT. Remote wedge compression fracture of L2 with approximately 50% loss of vertebral body height. Extensive facet hypertrophy in the LOWER lumbar levels. IMPRESSION: 1. Acute pancreatitis.  No evidence for abscess or pseudocyst. 2. Hepatic steatosis. 3. Distended gallbladder with large  3.3 centimeter stone. 4. Colonic diverticulosis. 5.  Aortic atherosclerosis.  (ICD10-I70.0) 6. Hysterectomy. 7. Numerous anterior abdominal wall fat containing hernias. 8. Remote L2 compression fracture. 9. RIGHT renal  cyst. 10. Bilateral LOWER lobe bronchiectasis. 11. Coronary artery disease. Electronically Signed   By: Nolon Nations M.D.   On: 04/05/2018 18:43               Blood pressure (!) 119/50, pulse 81, temperature 98.4 F (36.9 C), temperature source Oral, resp. rate 17, SpO2 100 %.  Physical exam:   General--thin frail appearing elderly female ENT--nonicteric Neck--supple Heart--regular rate and rhythm without murmurs or gallops Lungs--clear Abdomen--generally soft bowel sounds are present    Assessment: 1.  Gallstone pancreatitis.  Her LFTs are improving and hopefully this means she is passed the stone.  Unfortunately, she has a pacemaker and we will not be able to perform MRCP 2.  History of arrhythmias requiring pacemaker 3.  Chronic pain syndrome on chronic narcotics  Plan: She appears to be improving.  I have discussed with son possible ERCP if her liver tests do not improve.  Hopefully she will improve and will need to make a decision about whether or not to remove her gallbladder.  At her age this will not be an easy decision if her LFTs do not improve she may need ERCP.  We will follow with you.  At this point I would keep her n.p.o. and follow her clinically.   Nancy Fetter 04/06/2018, 7:02 AM   This note was created using voice recognition software and minor errors may Have occurred unintentionally. Pager: [phone] If no answer or after hours call [phone]

## 2018-04-06 NOTE — Progress Notes (Signed)
[place] for Hep[facility]n  Indication: atrial fibrillation  Allergies  Allergen Reactions  . Chlorthalidone Other (See Comments)    Hyponatremia  . Honey Bee Treatment [Bee Venom] Swelling and Rash  . Verapamil Other (See Comments)    Bradycardia - HR  35-45 on 40mg  TID  . Adhesive [Tape] Rash and Other (See Comments)    Skin irritation  . Amlodipine Other (See Comments)    Peripheral edema with 2.5mg  daily dose    Patient Measurements: Height: 5\' 2"  (157.5 cm) IBW/kg (Calculated) : 50.1 Heparin Dosing Weight: 52 kg weight from 10/19  Vital Signs: Temp: 98.2 F (36.8 C) (12/06 1430) BP: 147/50 (12/06 1430) Pulse Rate: 81 (12/06 1430)  Labs: Recent Labs    04/05/18 1624 04/05/18 2257 04/06/18 0608 04/06/18 0846 04/06/18 1209 04/06/18 1908  HGB 11.3*  --  10.2*  --  10.3*  --   HCT 35.2*  --  32.7*  --  33.3*  --   PLT 149*  --  122*  --  102*  --   APTT  --  34  --  106*  --  118*  LABPROT 13.0  --   --   --   --   --   INR 0.99  --   --   --   --   --   HEPARINUNFRC  --  0.88*  --   --   --   --   CREATININE 1.50*  --  1.47*  --   --   --     CrCl cannot be calculated (Unknown ideal weight.). Assessment: 84 y/o F with a h/o atrial fibrillation on Eliquis PTA admitted with possible gallstone pancreatitis to transition to heparin drip while NPO.   Today 04/06/18  - aPTT 118 which is above goal after rate decreased to 800 units/hr - initial HL elevated as expected -  No bleeding or line issues per RN  Goal of Therapy:  HL 0.3-0.7 APTT 66-102s Monitor platelets by anticoagulation protocol: Yes   Plan:  - Decrease heparin infusion to 700 units/hr - Monitor using aPTT until levels correlate - Check aPTT/HL in  ~ 8 hours with am labs - Continue to monitor H&H and platelets - monitor for s/s bleeding  Eudelia Bunch, Pharm.D [phone] 04/06/2018 7:31 PM

## 2018-04-06 NOTE — Consult Note (Signed)
Jordan [name] May 12, 1919  [id number].    Requesting MD: Dr. Gala Romney Chief Complaint/Reason for Consult: gallstone pancreatitis  HPI:  This is a 84 yo [place] female who is [place] and required a Optometrist as well as her daughter to then translate for the translator due to difficulty hearing the translator.  The patient states that she began having LUQ abdominal pain yesterday morning.  It then moved to her lower abdomen where she felt like something was wrong with her urinary bladder.  She has never had RUQ abdominal pain.  She admits to nausea with some dry heaving, but no emesis.  She has never had pain like this before.  She has never had biliary symptoms prior to this incident.  She does not drink alcohol.  She lives at home with her son.  She walks with a walker from one room to the next at max.  When she goes out, for instance to the grocery store, she uses the power chair.  She denies CP, SOB, or anything complaints.  She has had a ventral incisional hernia for a number of years and states that it does not bother her and because of this a surgeon decided, several years ago because of her age, that she should not have surgery.  She was brought to the ED yesterday by her family because of her pain.  She was found to have a lipase of 876 with a TB of 4 and some elevated LFTs.  Her WBC was slightly elevated at 13.9.  She had a CT scan that revealed acute pancreatitis as well as a distended gallbladder with a large 3.3cm stone.  The thought was that she had gallstone pancreatitis.  She has been admitted and we have been asked to see her for further evaluation.    ROS: ROS: Please see HPI, otherwise as best as I can tell from translations etc, that all other systems are negative at this time.  She does have a pacemaker and is on anticoagulation at home.  Family History  Problem Relation Age of Onset  . Kidney disease Mother   . Heart attack Neg Hx   . Stroke Neg Hx     Past Medical  History:  Diagnosis Date  . AKI (acute kidney injury) ([place])   . Alzheimer's dementia [facility]) 07/30/2013   04/17/14 MoCA - Blind (Spanish version); 9 out of 22.  Correct MoCA = 12 out of 30.    . Arthritis   . Atherosclerosis of aorta ([place]) 10/05/2015  . Back pain 10/05/2015  . Bronchiectasis ([place]) 10/03/2017  . Calcification of native coronary artery 10/05/2015   Chest CT 2011.   . Carpal tunnel syndrome 06/29/2006   Qualifier: Diagnosis of  By: Benna Dunks    . Cervicalgia   . Cervicalgia 05/18/2009   Qualifier: Diagnosis of  By: Walker Kehr MD, Patrick Jupiter    . CHF (congestive heart failure) ([place])   . Chronic bilateral thoracic back pain 10/05/2015  . Chronic constipation   . Chronic diastolic heart failure ([place]) 09/01/2013  . Chronic pain syndrome 05/09/2014  . CKD (chronic kidney disease) stage 3, GFR 30-59 ml/min (HCC) 09/03/2013  . Combined visual hearing impairment 11/12/2013  . Complete heart block ([place])   . Constipation   . Cryptogenic stroke ([place]) S/P  LOOP RECORDER IMPLANT   DX  01/2013  --  SYNCOPAL EPISODE --  EPISODIC  ATRIAL TACHY/ FIBULATION  AND PAUSES  . CYSTOCELE/RECTOCELE/PROLAPSE,UNSPEC. 06/29/2006   Qualifier: Diagnosis  of  By: Benna Dunks    . Depressive disorder 06/29/2006       . Dilation of esophagus 06/16/2017   Barium Esophagram 06/25/17: Poor esophageal motility. Dilated esophagus with tertiary contractions.  Smooth narrowing distal esophagus similar to the prior study.  Moderate narrowing. Barium tablet passed through this area after several sips of water.  Marland Kitchen Displaced fracture of fifth metatarsal bone, right foot, subsequent encounter for fracture with routine healing 06/05/2017  . Diverticulosis of colon   . DIVERTICULOSIS OF COLON 06/29/2006   Qualifier: Diagnosis of  By: Benna Dunks    . Dry eyes   . Dysphagia 09/15/2015  . Fall from bed 03/17/2016  . Fatigue   . Food impaction of esophagus   . Frequency of urination   . Gait instability   . Generalized osteoarthritis of  multiple sites 06/15/2017  . Glaucoma 04/18/2014  . Glaucoma of both eyes   . Gout 09/21/2013  . Hand Heaviness 03/17/2016  . Headache   . Hearing loss of aging 05/09/2014  . Hearing loss of both ears    no hearing aids  . HERNIA, INCISIONAL VENTRAL W/O OBST/GNGR 10/25/2006   Has been seen by surgery in the past and told that this is not dangerous and should only be treated particularly bothersome    . History of acute gouty arthritis 09/21/2013  . History of colon polyps   . History of Food impaction of esophagus   . HTN (hypertension) 06/29/2006   Isolated Systolic Hypertension - Ambulatory BP  Monitoring result 4/28-29/2015   . Hyperlipidemia 02/27/2015  . Hyperplastic colon polyp 2006   colonoscope  . Hypertension   . Hypochloremia   . Hyponatremia   . Hypothyroidism   . Impaired mobility and ADLs 05/30/2014  . Influenza A 05/14/2016  . Insomnia   . INTERSTITIAL CYSTITIS 09/21/2009   Qualifier: Diagnosis of  By: Sarita Haver  MD, Coralyn Mark    . Loss of weight 04/05/2010   Qualifier: Diagnosis of  By: Walker Kehr MD, Patrick Jupiter    . Lumbar stenosis   . Macular degeneration, age related, nonexudative 04/18/2014  . MASS, LUNG 04/05/2010   Qualifier: Diagnosis of  By: Walker Kehr MD, Patrick Jupiter    . Mobitz type 2 second degree heart block 08/29/2013  . Moderate dementia ([place])   . Narrowing of Distal Esophagus 06/16/2017   Barium Esophagram (06/15/17) Poor esophageal motility. Dilated esophagus with tertiary contractions.  Smooth narrowing distal esophagus similar to the prior study.  Moderate narrowing. Barium tablet passed through this area after several sips of water.   . OA (osteoarthritis)    RIGHT SHOULDER AC JOINT  . Orthostasis 04/28/2016  . OSTEOARTHRITIS, MULTI SITES 06/29/2006   Qualifier: Diagnosis of  By: Benna Dunks    . Osteoporosis   . OSTEOPOROSIS 03/20/2008   Qualifier: Diagnosis of  By: Jerline Pain MD, Anderson Malta    . Pacemaker   . 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 Peripheral neuropathy   . PERIPHERAL NEUROPATHY 03/29/2010   Qualifier: Diagnosis of  By: Walker Kehr MD, Patrick Jupiter    . Persistent headaches 09/06/2013  . Polymyalgia rheumatica ([place]) 11/08/2013  . Presbyesophagus (senile esophageal dysmotility) 06/16/2017  . Presence of permanent cardiac pacemaker   . Rib pain on left side 10/05/2015  . Right knee pain 01/15/2014  . Right rotator cuff tear   .  Rotator cuff tear arthropathy 09/11/2012   Likely related to rotator cuff tendinopathy seen on musculoskeletal ultrasound, and GH DJD.  Complete tear of right supra and infraspinatus on MRI 10/2012     . S/P shoulder replacement 03/20/2015  . Shortness of breath 07/21/2014  . Shortness of breath dyspnea   . Spinal stenosis of lumbar region 10/03/2012   Patient has significant low back pain related to spinal stenosis and DJD, DDD.  We had a long discussion about treatment options which are quite limited. She is likely not a good surgical candidate for laminectomy. I am not sure about the usefulness of epidural steroid injection. We'll plan on restarting meloxicam after a long discussion of the pros and cons and risks of chronic kidney disease.  The patient and her family feel that the benefit of good pain control is the small risk.  Additionally she will followup with her primary care provider for creatinine monitoring.  Will obtain physical therapy for TENS unit trial.  Will refer to pain management for consideration of epidural steroid injection as that worked well in the past for cervical pain Followup here as needed   . SUI (stress urinary incontinence, female)   . Syncope and collapse 02/09/2013  . Urinary frequency 06/05/2015  . Urinary incontinence, mixed 06/29/2006   Qualifier: Diagnosis of  By: Benna Dunks    . UTI (urinary tract infection) 05/27/2016  . UTI (urinary  tract infection) 05/2017  . Vertigo 10/18/2010  . Weakness 08/29/2013    Past Surgical History:  Procedure Laterality Date  . CARPAL TUNNEL RELEASE Right   . ESOPHAGOGASTRODUODENOSCOPY N/A 09/15/2015   Procedure: ESOPHAGOGASTRODUODENOSCOPY (EGD);  Surgeon: Clarene Essex, MD;  Location[facility] ENDOSCOPY;  Service: Endoscopy;  Laterality: N/A;  . EYE SURGERY     cataract, bilat.  Randolm Idol / REPLACE / REMOVE PACEMAKER    . LOOP RECORDER IMPLANT  02-08-13; 08-30-13   MDT LinQ implanted by Dr Lovena Le for syncope, explanted 08-30-13 after CHB identified  . LOOP RECORDER IMPLANT N/A 02/11/2013   Procedure: LOOP RECORDER IMPLANT;  Surgeon: Evans Lance, MD;  Location[facility] C[facility] LAB;  Service: Cardiovascular;  Laterality: N/A;  . PACEMAKER INSERTION  08-30-2013   MDT [id number] pacemaker implanted by Dr Rayann Heman for CHB  . PERMANENT PACEMAKER INSERTION N/A 08/30/2013   Procedure: PERMANENT PACEMAKER INSERTION;  Surgeon: Coralyn Mark, MD;  Location: [place] CATH LAB;  Service: Cardiovascular;  Laterality: N/A;  . REVERSE SHOULDER ARTHROPLASTY Right 03/20/2015   Procedure: RIGHT REVERSE SHOULDER ARTHROPLASTY;  Surgeon: Netta Cedars, MD;  Location: [place];  Service: Orthopedics;  Laterality: Right;  . TOTAL ABDOMINAL HYSTERECTOMY W/ BILATERAL SALPINGOOPHORECTOMY  03-15-2005    Social History:  reports that she has never smoked. She has never used smokeless tobacco. She reports that she does not drink alcohol or use drugs.  Allergies:  Allergies  Allergen Reactions  . Chlorthalidone Other (See Comments)    Hyponatremia  . Honey Bee Treatment [Bee Venom] Swelling and Rash  . Verapamil Other (See Comments)    Bradycardia - HR  35-45 on 40mg  TID  . Adhesive [Tape] Rash and Other (See Comments)    Skin irritation  . Amlodipine Other (See Comments)    Peripheral edema with 2.5mg  daily dose    Medications Prior to Admission  Medication Sig Dispense Refill  . acetaminophen (TYLENOL) 650 MG CR tablet Take 650 mg by mouth  2 (two) times daily as needed for pain.    Marland Kitchen allopurinol (ZYLOPRIM) 100  MG tablet take 1 tablet by mouth once daily 30 tablet 12  . apixaban (ELIQUIS) 2.5 MG TABS tablet Take 2.5 mg by mouth 2 (two) times daily.    . beta carotene w/minerals (OCUVITE) tablet Take 1 tablet by mouth daily. 90 tablet 3  . COLCRYS 0.6 MG tablet TAKE 1 TABLET BY MOUTH TWICE DAILY 240 tablet 0  . cycloSPORINE (RESTASIS) 0.05 % ophthalmic emulsion Place 1 drop into both eyes at bedtime.    . diclofenac sodium (VOLTAREN) 1 % GEL Apply 2 g topically 4 (four) times daily. (Patient taking differently: Apply 2 g topically 4 (four) times daily as needed (pain). ) 100 g prn  . DULoxetine (CYMBALTA) 30 MG capsule Take 30 mg by mouth daily.  2  . esomeprazole (NEXIUM) 40 MG capsule Take 1 capsule (40 mg total) by mouth daily. 30 capsule PRN  . fesoterodine (TOVIAZ) 4 MG TB24 tablet Take 1 tablet (4 mg total) by mouth daily. 30 tablet PRN  . furosemide (LASIX) 20 MG tablet Take 1 tablet (20 mg total) by mouth daily. (Patient taking differently: Take 20 mg by mouth daily as needed for fluid. ) 30 tablet 3  . gabapentin (NEURONTIN) 100 MG capsule Take 2 capsules (200 mg total) by mouth 2 (two) times daily. 180 capsule 5  . levothyroxine (SYNTHROID, LEVOTHROID) 100 MCG tablet TAKE 1 TABLET BY MOUTH ONCE DAILY 90 tablet PRN  . Lidocaine 0.5 % GEL Apply 1 application topically daily as needed (back pain).    . metoprolol succinate (TOPROL-XL) 25 MG 24 hr tablet Take 12.5 mg by mouth daily.   0  . TRAVATAN Z 0.004 % SOLN ophthalmic solution Place 1 drop into both eyes at bedtime.     . traZODone (DESYREL) 50 MG tablet Take 50 mg by mouth at bedtime.  2  . vitamin B-12 (CYANOCOBALAMIN) 1000 MCG tablet Take 1,000 mcg by mouth daily.    Marland Kitchen oxyCODONE (ROXICODONE) 5 MG immediate release tablet Take 0.5 tablets (2.5 mg total) by mouth every 4 (four) hours as needed for severe pain. (Patient not taking: Reported on 05/31/2017) 30 tablet 0  .  polyethylene glycol (MIRALAX / GLYCOLAX) packet take 17 grams by mouth once daily (Patient taking differently: Take 17 g by mouth daily as needed for moderate constipation. ) 100 packet PRN     Physical Exam: Blood pressure (!) 147/50, pulse 81, temperature 98.2 F (36.8 C), resp. rate 20, height 5\' 2"  (1.575 m), SpO2 94 %. General: pleasant, elderly [place] female who is laying in bed in NAD HEENT: head is normocephalic, atraumatic.  Sclera are noninjected.  PERRL.  Ears and nose without any masses or lesions.  Mouth is pink and moist Heart: regular, rate, and rhythm.  Pacemaker noted in [place].  Normal s1,s2. No obvious murmurs, gallops, or rubs noted.  Palpable radial and pedal pulses bilaterally Lungs: CTAB, no wheezes, rhonchi, or rales noted.  Respiratory effort nonlabored Abd: soft, minimal epigastric tenderness, ND, +BS, no masses or organomegaly.  She does have a soft lower abdominal ventral incisional hernia noted. MS: all 4 extremities are symmetrical with no cyanosis, clubbing, or edema. Skin: warm and dry with no masses, lesions, or rashes Psych: A&Ox3 with an appropriate affect.   Results for orders placed or performed during the hospital encounter of 04/05/18 (from the past 48 hour(s))  Urinalysis, Routine w reflex microscopic     Status: Abnormal   Collection Time: 04/05/18  3:31 PM  Result Value Ref Range  Color, Urine AMBER (A) YELLOW    Comment: BIOCHEMICALS MAY BE AFFECTED BY COLOR   APPearance CLEAR CLEAR   Specific Gravity, Urine 1.014 1.005 - 1.030   pH 5.0 5.0 - 8.0   Glucose, UA NEGATIVE NEGATIVE mg/dL   Hgb urine dipstick SMALL (A) NEGATIVE   Bilirubin Urine NEGATIVE NEGATIVE   Ketones, ur NEGATIVE NEGATIVE mg/dL   Protein, ur 30 (A) NEGATIVE mg/dL   Nitrite NEGATIVE NEGATIVE   Leukocytes, UA NEGATIVE NEGATIVE   RBC / HPF 11-20 0 - 5 RBC/hpf   WBC, UA 6-10 0 - 5 WBC/hpf   Bacteria, UA RARE (A) NONE SEEN   Mucus PRESENT     Comment: Performed at [facility], [place] [address] [place], [place] [address]  Lipase, blood     Status: Abnormal   Collection Time: 04/05/18  4:24 PM  Result Value Ref Range   Lipase 876 (H) 11 - 51 U/L    Comment: RESULTS CONFIRMED BY MANUAL DILUTION Performed at [facility], [place] [address] [place], [place] [address]   Comprehensive metabolic panel     Status: Abnormal   Collection Time: 04/05/18  4:24 PM  Result Value Ref Range   Sodium 137 135 - 145 mmol/L   Potassium 4.6 3.5 - 5.1 mmol/L   Chloride 99 98 - 111 mmol/L   CO2 26 22 - 32 mmol/L   Glucose, Bld 110 (H) 70 - 99 mg/dL   BUN 25 (H) 8 - 23 mg/dL   Creatinine, Ser 1.50 (H) 0.44 - 1.00 mg/dL   Calcium 8.8 (L) 8.9 - 10.3 mg/dL   Total Protein 5.7 (L) 6.5 - 8.1 g/dL   Albumin 3.0 (L) 3.5 - 5.0 g/dL   AST 231 (H) 15 - 41 U/L   ALT 135 (H) 0 - 44 U/L   Alkaline Phosphatase 242 (H) 38 - 126 U/L   Total Bilirubin 4.0 (H) 0.3 - 1.2 mg/dL   GFR calc non Af Amer 29 (L) >60 mL/min   GFR calc Af Amer 34 (L) >60 mL/min   Anion gap 12 5 - 15    Comment: Performed at [facility], [place] [address] [place], [place] [address]  CBC with Differential     Status: Abnormal   Collection Time: 04/05/18  4:24 PM  Result Value Ref Range   WBC 13.9 (H) 4.0 - 10.5 K/uL   RBC 3.34 (L) 3.87 - 5.11 MIL/uL   Hemoglobin 11.3 (L) 12.0 - 15.0 g/dL   HCT 35.2 (L) 36.0 - 46.0 %   MCV 105.4 (H) 80.0 - 100.0 fL   MCH 33.8 26.0 - 34.0 pg   MCHC 32.1 30.0 - 36.0 g/dL   RDW 14.7 11.5 - 15.5 %   Platelets 149 (L) 150 - 400 K/uL   nRBC 0.0 0.0 - 0.2 %   Neutrophils Relative % 87 %   Neutro Abs 12.1 (H) 1.7 - 7.7 K/uL   Lymphocytes Relative 5 %   Lymphs Abs 0.7 0.7 - 4.0 K/uL   Monocytes Relative 7 %   Monocytes Absolute 1.0 0.1 - 1.0 K/uL   Eosinophils Relative 0 %   Eosinophils Absolute 0.0 0.0 - 0.5 K/uL   Basophils Relative 0 %   Basophils Absolute 0.0 0.0 - 0.1 K/uL   Immature Granulocytes 1 %   Abs Immature  Granulocytes 0.10 (H) 0.00 - 0.07 K/uL    Comment: Performed at [facility], [place]  [address] [place], [place] [address]  Protime-INR     Status: None   Collection Time: 04/05/18  4:24 PM  Result Value Ref Range   Prothrombin Time 13.0 11.4 - 15.2 seconds   INR 0.99     Comment: Performed at [facility], [place] [address] [place], [place] [address]  I-stat troponin, ED     Status: None   Collection Time: 04/05/18  4:39 PM  Result Value Ref Range   Troponin i, poc 0.02 0.00 - 0.08 ng/mL   Comment 3            Comment: Due to the release kinetics of cTnI, a negative result within the first hours of the onset of symptoms does not rule out myocardial infarction with certainty. If myocardial infarction is still suspected, repeat the test at appropriate intervals.   I-Stat CG4 Lactic Acid, ED     Status: Abnormal   Collection Time: 04/05/18  4:40 PM  Result Value Ref Range   Lactic Acid, Venous 2.73 (HH) 0.5 - 1.9 mmol/L   Comment NOTIFIED PHYSICIAN   I-Stat CG4 Lactic Acid, ED     Status: None   Collection Time: 04/05/18  8:45 PM  Result Value Ref Range   Lactic Acid, Venous 1.55 0.5 - 1.9 mmol/L  APTT     Status: None   Collection Time: 04/05/18 10:57 PM  Result Value Ref Range   aPTT 34 24 - 36 seconds    Comment: Performed at [facility], [place] [address], [place], [place] [address]  Heparin level (unfractionated)     Status: Abnormal   Collection Time: 04/05/18 10:57 PM  Result Value Ref Range   Heparin Unfractionated 0.88 (H) 0.30 - 0.70 IU/mL    Comment: (NOTE) If heparin results are below expected values, and patient dosage has  been confirmed, suggest follow up testing of antithrombin III levels. Performed at [facility], [place] [address], [place], [place] [address]   CBC     Status: Abnormal   Collection Time: 04/06/18  6:08 AM  Result Value Ref Range   WBC 15.2 (H) 4.0 - 10.5 K/uL   RBC 3.16  (L) 3.87 - 5.11 MIL/uL   Hemoglobin 10.2 (L) 12.0 - 15.0 g/dL   HCT 32.7 (L) 36.0 - 46.0 %   MCV 103.5 (H) 80.0 - 100.0 fL   MCH 32.3 26.0 - 34.0 pg   MCHC 31.2 30.0 - 36.0 g/dL   RDW 14.8 11.5 - 15.5 %   Platelets 122 (L) 150 - 400 K/uL   nRBC 0.0 0.0 - 0.2 %    Comment: Performed at [facility], [place] [address], [place], [place] [address]  Comprehensive metabolic panel     Status: Abnormal   Collection Time: 04/06/18  6:08 AM  Result Value Ref Range   Sodium 137 135 - 145 mmol/L   Potassium 4.6 3.5 - 5.1 mmol/L   Chloride 102 98 - 111 mmol/L   CO2 26 22 - 32 mmol/L   Glucose, Bld 119 (H) 70 - 99 mg/dL   BUN 28 (H) 8 - 23 mg/dL   Creatinine, Ser 1.47 (H) 0.44 - 1.00 mg/dL   Calcium 7.8 (L) 8.9 - 10.3 mg/dL   Total Protein 4.9 (L) 6.5 - 8.1 g/dL   Albumin 2.6 (L) 3.5 - 5.0 g/dL   AST 119 (H) 15 - 41 U/L   ALT 95 (H) 0 - 44 U/L   Alkaline Phosphatase 176 (  H) 38 - 126 U/L   Total Bilirubin 1.9 (H) 0.3 - 1.2 mg/dL   GFR calc non Af Amer 30 (L) >60 mL/min   GFR calc Af Amer 35 (L) >60 mL/min   Anion gap 9 5 - 15    Comment: Performed at [facility], [place] [address] [place], [place] [address]  APTT     Status: Abnormal   Collection Time: 04/06/18  8:46 AM  Result Value Ref Range   aPTT 106 (H) 24 - 36 seconds    Comment:        IF BASELINE aPTT IS ELEVATED, SUGGEST PATIENT RISK ASSESSMENT BE USED TO DETERMINE APPROPRIATE ANTICOAGULANT THERAPY. Performed at [facility], [place] [address], [place], [place] [address]   CBC with Differential/Platelet     Status: Abnormal   Collection Time: 04/06/18 12:09 PM  Result Value Ref Range   WBC 13.2 (H) 4.0 - 10.5 K/uL   RBC 3.09 (L) 3.87 - 5.11 MIL/uL   Hemoglobin 10.3 (L) 12.0 - 15.0 g/dL   HCT 33.3 (L) 36.0 - 46.0 %   MCV 107.8 (H) 80.0 - 100.0 fL   MCH 33.3 26.0 - 34.0 pg   MCHC 30.9 30.0 - 36.0 g/dL   RDW 15.1 11.5 - 15.5 %   Platelets 102 (L) 150 - 400 K/uL    Comment:  REPEATED TO VERIFY PLATELET COUNT CONFIRMED BY SMEAR SPECIMEN CHECKED FOR CLOTS Immature Platelet Fraction may be clinically indicated, consider ordering this additional test [id number]    nRBC 0.0 0.0 - 0.2 %   Neutrophils Relative % 81 %   Neutro Abs 10.7 (H) 1.7 - 7.7 K/uL   Lymphocytes Relative 10 %   Lymphs Abs 1.3 0.7 - 4.0 K/uL   Monocytes Relative 8 %   Monocytes Absolute 1.1 (H) 0.1 - 1.0 K/uL   Eosinophils Relative 0 %   Eosinophils Absolute 0.0 0.0 - 0.5 K/uL   Basophils Relative 0 %   Basophils Absolute 0.0 0.0 - 0.1 K/uL   Immature Granulocytes 1 %   Abs Immature Granulocytes 0.14 (H) 0.00 - 0.07 K/uL    Comment: Performed at [facility], [place] [address], [place], [place] [address]  Procalcitonin - Baseline     Status: None   Collection Time: 04/06/18 12:09 PM  Result Value Ref Range   Procalcitonin 20.87 ng/mL    Comment:        Interpretation: PCT >= 10 ng/mL: Important systemic inflammatory response, almost exclusively due to severe bacterial sepsis or septic shock. (NOTE)       Sepsis PCT Algorithm           Lower Respiratory Tract                                      Infection PCT Algorithm    ----------------------------     ----------------------------         PCT < 0.25 ng/mL                PCT < 0.10 ng/mL         Strongly encourage             Strongly discourage   discontinuation of antibiotics    initiation of antibiotics    ----------------------------     -----------------------------       PCT 0.25 - 0.50 ng/mL  PCT 0.10 - 0.25 ng/mL               OR       >80% decrease in PCT            Discourage initiation of                                            antibiotics      Encourage discontinuation           of antibiotics    ----------------------------     -----------------------------         PCT >= 0.50 ng/mL              PCT 0.26 - 0.50 ng/mL                AND       <80% decrease in PCT             Encourage  initiation of                                             antibiotics       Encourage continuation           of antibiotics    ----------------------------     -----------------------------        PCT >= 0.50 ng/mL                  PCT > 0.50 ng/mL               AND         increase in PCT                  Strongly encourage                                      initiation of antibiotics    Strongly encourage escalation           of antibiotics                                     -----------------------------                                           PCT <= 0.25 ng/mL                                                 OR                                        > 80% decrease in PCT  Discontinue / Do not initiate                                             antibiotics Performed at [place] [address], [place], [place] [address]   Culture, blood (routine x 2)     Status: None (Preliminary result)   Collection Time: 04/06/18 12:09 PM  Result Value Ref Range   Specimen Description      BLOOD RIGHT ARM Performed at [place] [address], [place], [place] [address]    Special Requests      BOTTLES DRAWN AEROBIC ONLY Blood Culture results may not be optimal due to an inadequate volume of blood received in culture bottles Performed at [place] [address], [place], [place] [address]    Culture      NO GROWTH <12 HOURS Performed at [place] [address], [place], [place] [address]    Report Status PENDING   Culture, blood (routine x 2)     Status: None (Preliminary result)   Collection Time: 04/06/18 12:09 PM  Result Value Ref Range   Specimen Description      BLOOD RIGHT HAND Performed at [place] [address], [place], [place] [address]    Special Requests      BOTTLES DRAWN AEROBIC AND ANAEROBIC Blood Culture results may not be  optimal due to an inadequate volume of blood received in culture bottles Performed at [facility], [place] [address], [place], [place] [address]    Culture      NO GROWTH <12 HOURS Performed at [place] [address] [place], [place] [address]    Report Status PENDING   Lactic acid, plasma     Status: None   Collection Time: 04/06/18 12:10 PM  Result Value Ref Range   Lactic Acid, Venous 1.1 0.5 - 1.9 mmol/L    Comment: Performed at [facility], [place] [address] [place],  [address]   Ct Abdomen Pelvis Wo Contrast  Result Date: 04/05/2018 CLINICAL DATA:  Per patient's daughter pt woke up this am with upper abd pain and nausea Pain became diffuse today and still nauseated, has some back pain Originally ordered as a dissection but changed due to low GFR No oral Hx of hernia and hysterectomy No hx of CA EXAM: CT ABDOMEN AND PELVIS WITHOUT CONTRAST TECHNIQUE: Multidetector CT imaging of the abdomen and pelvis was performed following the standard protocol without IV contrast. COMPARISON:  CT of the abdomen and pelvis on 11/02/2015 FINDINGS: Lower chest: Scattered emphysematous changes are present. There is bronchiectasis in the LEFT and RIGHT LOWER lobes. Patient has cardiac lead to the RIGHT ventricle. There is atherosclerotic calcification of the coronary arteries. Hepatobiliary: Liver attenuation is diffusely low, consistent with hepatic steatosis. No focal liver lesions are identified. The gallbladder is distended and contains a large gallstone which measures 2.4 x 3.3 centimeters. Pancreas: There is peripancreatic stranding which extends to the root of the mesentery and the anterior pararenal space. No focal fluid collections. No pseudocyst or abscess. Spleen: Normal in size without focal abnormality. Adrenals/Urinary Tract: Adrenals are normal in appearance. There is a benign cyst in the LOWER pole the RIGHT kidney. LEFT kidney is unremarkable.  The ureters are normal in appearance. The bladder and  visualized portion of the urethra are normal. Stomach/Bowel: The stomach and small bowel loops are normal in appearance. There are scattered colonic diverticula but no acute diverticulitis. Significant rectosigmoid stool. No evidence for acute appendicitis. Vascular/Lymphatic: There is dense atherosclerotic calcification of the abdominal aorta not associated with aneurysm. No retroperitoneal or mesenteric adenopathy. Reproductive: Hysterectomy.  No adnexal mass. Other: Numerous fat containing anterior abdominal wall hernias are identified below the level of the umbilicus no evidence for associated bowel obstruction. Musculoskeletal: Significant degenerative changes in the lumbar spine, associated with scoliosis, convex to the LEFT. Remote wedge compression fracture of L2 with approximately 50% loss of vertebral body height. Extensive facet hypertrophy in the LOWER lumbar levels. IMPRESSION: 1. Acute pancreatitis.  No evidence for abscess or pseudocyst. 2. Hepatic steatosis. 3. Distended gallbladder with large 3.3 centimeter stone. 4. Colonic diverticulosis. 5.  Aortic atherosclerosis.  (ICD10-I70.0) 6. Hysterectomy. 7. Numerous anterior abdominal wall fat containing hernias. 8. Remote L2 compression fracture. 9. RIGHT renal cyst. 10. Bilateral LOWER lobe bronchiectasis. 11. Coronary artery disease. Electronically Signed   By: Nolon Nations M.D.   On: 04/05/2018 18:43      Assessment/Plan Multiple medical problems  Gallstone pancreatitis The patient does appear to have gallstone pancreatitis.  She feels much better today with improvement in her pain.  Her TB is down to 1.9 today from 4 and her other LFTs are normalizing as well.  She has been seen by GI and MRCP to rule out CBD stone is not an option secondary to her pacemaker.  Given her decreasing labs, it seems reasonable at this time, to just follow her clinically and to follow her labs prior to  proceeding with ERCP or any other procedure.  With that being said, given her age and comorbidities, surgically we would NOT recommendation lap chole for this patient.  She has a low risk of recurrence and is certainly a higher risk patient for a surgical procedure.  Agree with NPO today.  If she continues to feel better she can hopefully start on some liquids tomorrow.  This has all been discussed with the patient and her daughter via [employer].   FEN - NPO/IVFs VTE - heparin gtt, eliquis on hold ID - [email or web address]  Henreitta Cea, [facility] 04/06/2018, 2:55 PM Pager: [phone]

## 2018-04-06 NOTE — Progress Notes (Signed)
ANTICOAGULATION CONSULT NOTE - Initial Consult  Pharmacy Consult for Hepairn  Indication: atrial fibrillation  Allergies  Allergen Reactions  . Chlorthalidone Other (See Comments)    Hyponatremia  . Honey Bee Treatment [Bee Venom] Swelling and Rash  . Verapamil Other (See Comments)    Bradycardia - HR  35-45 on 40mg  TID  . Adhesive [Tape] Rash and Other (See Comments)    Skin irritation  . Amlodipine Other (See Comments)    Peripheral edema with 2.5mg  daily dose    Patient Measurements:   Heparin Dosing Weight: 52 kg weight from 10/19  Vital Signs: Temp: 98.4 F (36.9 C) (12/06 0505) Temp Source: Oral (12/06 0505) BP: 119/50 (12/06 0505) Pulse Rate: 81 (12/06 0505)  Labs: Recent Labs    04/05/18 1624 04/05/18 2257 04/06/18 0608 04/06/18 0846  HGB 11.3*  --  10.2*  --   HCT 35.2*  --  32.7*  --   PLT 149*  --  122*  --   APTT  --  34  --  106*  LABPROT 13.0  --   --   --   INR 0.99  --   --   --   HEPARINUNFRC  --  0.88*  --   --   CREATININE 1.50*  --  1.47*  --     CrCl cannot be calculated (Unknown ideal weight.).   Medical History: Past Medical History:  Diagnosis Date  . AKI (acute kidney injury) ([place])   . Alzheimer's dementia [facility]) 07/30/2013   04/17/14 MoCA - Blind (Spanish version); 9 out of 22.  Correct MoCA = 12 out of 30.    . Arthritis   . Atherosclerosis of aorta ([place]) 10/05/2015  . Back pain 10/05/2015  . Bronchiectasis ([place]) 10/03/2017  . Calcification of native coronary artery 10/05/2015   Chest CT 2011.   . Carpal tunnel syndrome 06/29/2006   Qualifier: Diagnosis of  By: Benna Dunks    . Cervicalgia   . Cervicalgia 05/18/2009   Qualifier: Diagnosis of  By: Walker Kehr MD, Patrick Jupiter    . CHF (congestive heart failure) ([place])   . Chronic bilateral thoracic back pain 10/05/2015  . Chronic constipation   . Chronic diastolic heart failure ([place]) 09/01/2013  . Chronic pain syndrome 05/09/2014  . CKD (chronic kidney disease) stage 3, GFR 30-59 ml/min (HCC) 09/03/2013   . Combined visual hearing impairment 11/12/2013  . Complete heart block ([place])   . Constipation   . Cryptogenic stroke ([place]) S/P  LOOP RECORDER IMPLANT   DX  01/2013  --  SYNCOPAL EPISODE --  EPISODIC  ATRIAL TACHY/ FIBULATION  AND PAUSES  . CYSTOCELE/RECTOCELE/PROLAPSE,UNSPEC. 06/29/2006   Qualifier: Diagnosis of  By: Benna Dunks    . Depressive disorder 06/29/2006       . Dilation of esophagus 06/16/2017   Barium Esophagram 06/25/17: Poor esophageal motility. Dilated esophagus with tertiary contractions.  Smooth narrowing distal esophagus similar to the prior study.  Moderate narrowing. Barium tablet passed through this area after several sips of water.  Marland Kitchen Displaced fracture of fifth metatarsal bone, right foot, subsequent encounter for fracture with routine healing 06/05/2017  . Diverticulosis of colon   . DIVERTICULOSIS OF COLON 06/29/2006   Qualifier: Diagnosis of  By: Benna Dunks    . Dry eyes   . Dysphagia 09/15/2015  . Fall from bed 03/17/2016  . Fatigue   . Food impaction of esophagus   . Frequency of urination   . Gait instability   . Generalized  osteoarthritis of multiple sites 06/15/2017  . Glaucoma 04/18/2014  . Glaucoma of both eyes   . Gout 09/21/2013  . Hand Heaviness 03/17/2016  . Headache   . Hearing loss of aging 05/09/2014  . Hearing loss of both ears    no hearing aids  . HERNIA, INCISIONAL VENTRAL W/O OBST/GNGR 10/25/2006   Has been seen by surgery in the past and told that this is not dangerous and should only be treated particularly bothersome    . History of acute gouty arthritis 09/21/2013  . History of colon polyps   . History of Food impaction of esophagus   . HTN (hypertension) 06/29/2006   Isolated Systolic Hypertension - Ambulatory BP  Monitoring result 4/28-29/2015   . Hyperlipidemia 02/27/2015  . Hyperplastic colon polyp 2006   colonoscope  . Hypertension   . Hypochloremia   . Hyponatremia   . Hypothyroidism   . Impaired mobility and ADLs 05/30/2014  .  Influenza A 05/14/2016  . Insomnia   . INTERSTITIAL CYSTITIS 09/21/2009   Qualifier: Diagnosis of  By: Sarita Haver  MD, Coralyn Mark    . Loss of weight 04/05/2010   Qualifier: Diagnosis of  By: Walker Kehr MD, Patrick Jupiter    . Lumbar stenosis   . Macular degeneration, age related, nonexudative 04/18/2014  . MASS, LUNG 04/05/2010   Qualifier: Diagnosis of  By: Walker Kehr MD, Patrick Jupiter    . Mobitz type 2 second degree heart block 08/29/2013  . Moderate dementia ([place])   . Narrowing of Distal Esophagus 06/16/2017   Barium Esophagram (06/15/17) Poor esophageal motility. Dilated esophagus with tertiary contractions.  Smooth narrowing distal esophagus similar to the prior study.  Moderate narrowing. Barium tablet passed through this area after several sips of water.   . OA (osteoarthritis)    RIGHT SHOULDER AC JOINT  . Orthostasis 04/28/2016  . OSTEOARTHRITIS, MULTI SITES 06/29/2006   Qualifier: Diagnosis of  By: Benna Dunks    . Osteoporosis   . OSTEOPOROSIS 03/20/2008   Qualifier: Diagnosis of  By: Jerline Pain MD, Anderson Malta    . Pacemaker   . 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 Peripheral neuropathy   . PERIPHERAL NEUROPATHY 03/29/2010   Qualifier: Diagnosis of  By: Walker Kehr MD, Patrick Jupiter    . Persistent headaches 09/06/2013  . Polymyalgia rheumatica ([place]) 11/08/2013  . Presbyesophagus (senile esophageal dysmotility) 06/16/2017  . Presence of permanent cardiac pacemaker   . Rib pain on left side 10/05/2015  . Right knee pain 01/15/2014  . Right rotator cuff tear   . Rotator cuff tear arthropathy 09/11/2012   Likely related to rotator cuff tendinopathy seen on musculoskeletal ultrasound, and GH DJD.  Complete tear of right supra and infraspinatus on MRI 10/2012     . S/P shoulder replacement 03/20/2015  . Shortness of breath 07/21/2014  . Shortness of  breath dyspnea   . Spinal stenosis of lumbar region 10/03/2012   Patient has significant low back pain related to spinal stenosis and DJD, DDD.  We had a long discussion about treatment options which are quite limited. She is likely not a good surgical candidate for laminectomy. I am not sure about the usefulness of epidural steroid injection. We'll plan on restarting meloxicam after a long discussion of the pros and cons and risks of chronic kidney disease.  The patient and her family feel that  the benefit of good pain control is the small risk.  Additionally she will followup with her primary care provider for creatinine monitoring.  Will obtain physical therapy for TENS unit trial.  Will refer to pain management for consideration of epidural steroid injection as that worked well in the past for cervical pain Followup here as needed   . SUI (stress urinary incontinence, female)   . Syncope and collapse 02/09/2013  . Urinary frequency 06/05/2015  . Urinary incontinence, mixed 06/29/2006   Qualifier: Diagnosis of  By: Benna Dunks    . UTI (urinary tract infection) 05/27/2016  . UTI (urinary tract infection) 05/2017  . Vertigo 10/18/2010  . Weakness 08/29/2013    Medications:  Apixaban 2.5 mg bid pta last 12/4 PM  Assessment: 84 y/o F with a h/o atrial fibrillation on Eliquis PTA admitted with possible gallstone pancreatitis to transition to heparin drip while NPO.   Today 04/06/18  - aPTT slightly above goal - initial HL elevated as expected - Hbg, Plt decreased slightly likely dilutional - No bleeding or line issues per RN  Goal of Therapy:  HL 0.3-0.7 APTT 66-102s Monitor platelets by anticoagulation protocol: Yes   Plan:  - Decrease heparin infusion to 800 units/hr - Monitor using aPTT until levels correlate - Check aPTT in 8 hours  - Next HL in AM - Continue to monitor H&H and platelets - monitor for s/s bleeding  Ulice Dash D 04/06/2018,10:44 AM

## 2018-04-06 NOTE — Progress Notes (Signed)
Pt to be transferred to [place] for telemetry/cardiac monitoring. Report called and given to [facility], RN regarding pt hx, status, and reason for transfer,

## 2018-04-07 DIAGNOSIS — N183 Chronic kidney disease, stage 3 (moderate): Secondary | ICD-10-CM

## 2018-04-07 DIAGNOSIS — I10 Essential (primary) hypertension: Secondary | ICD-10-CM

## 2018-04-07 LAB — CBC
HCT: 30 % — ABNORMAL LOW (ref 36.0–46.0)
Hemoglobin: 9.3 g/dL — ABNORMAL LOW (ref 12.0–15.0)
MCH: 32.9 pg (ref 26.0–34.0)
MCHC: 31 g/dL (ref 30.0–36.0)
MCV: 106 fL — AB (ref 80.0–100.0)
Platelets: 103 10*3/uL — ABNORMAL LOW (ref 150–400)
RBC: 2.83 MIL/uL — ABNORMAL LOW (ref 3.87–5.11)
RDW: 15 % (ref 11.5–15.5)
WBC: 8.4 10*3/uL (ref 4.0–10.5)
nRBC: 0 % (ref 0.0–0.2)

## 2018-04-07 LAB — COMPREHENSIVE METABOLIC PANEL
ALT: 66 U/L — ABNORMAL HIGH (ref 0–44)
AST: 59 U/L — AB (ref 15–41)
Albumin: 2.6 g/dL — ABNORMAL LOW (ref 3.5–5.0)
Alkaline Phosphatase: 154 U/L — ABNORMAL HIGH (ref 38–126)
Anion gap: 6 (ref 5–15)
BUN: 26 mg/dL — ABNORMAL HIGH (ref 8–23)
CO2: 28 mmol/L (ref 22–32)
Calcium: 8.2 mg/dL — ABNORMAL LOW (ref 8.9–10.3)
Chloride: 106 mmol/L (ref 98–111)
Creatinine, Ser: 1.26 mg/dL — ABNORMAL HIGH (ref 0.44–1.00)
GFR calc Af Amer: 42 mL/min — ABNORMAL LOW (ref >60)
GFR, EST NON AFRICAN AMERICAN: 36 mL/min — AB (ref >60)
Glucose, Bld: 94 mg/dL (ref 70–99)
Potassium: 4.1 mmol/L (ref 3.5–5.1)
Sodium: 140 mmol/L (ref 135–145)
Total Bilirubin: 1 mg/dL (ref 0.3–1.2)
Total Protein: 4.6 g/dL — ABNORMAL LOW (ref 6.5–8.1)

## 2018-04-07 LAB — LIPASE, BLOOD: Lipase: 70 U/L — ABNORMAL HIGH (ref 11–51)

## 2018-04-07 LAB — HEPARIN LEVEL (UNFRACTIONATED): Heparin Unfractionated: 0.52 IU/mL (ref 0.30–0.70)

## 2018-04-07 LAB — APTT: APTT: 72 s — AB (ref 24–36)

## 2018-04-07 MED ORDER — ACETAMINOPHEN 325 MG PO TABS
650.0000 mg | ORAL_TABLET | Freq: Four times a day (QID) | ORAL | Status: DC | PRN
  Administered 2018-04-07 – 2018-04-09 (×4): 650 mg via ORAL
  Filled 2018-04-07 (×4): qty 2

## 2018-04-07 MED ORDER — PANTOPRAZOLE SODIUM 40 MG PO TBEC
40.0000 mg | DELAYED_RELEASE_TABLET | Freq: Every day | ORAL | Status: DC
  Administered 2018-04-08 – 2018-04-10 (×3): 40 mg via ORAL
  Filled 2018-04-07 (×3): qty 1

## 2018-04-07 MED ORDER — FESOTERODINE FUMARATE ER 4 MG PO TB24
4.0000 mg | ORAL_TABLET | Freq: Every day | ORAL | Status: DC
  Administered 2018-04-08 – 2018-04-09 (×2): 4 mg via ORAL
  Filled 2018-04-07 (×4): qty 1

## 2018-04-07 MED ORDER — TRAZODONE HCL 50 MG PO TABS
50.0000 mg | ORAL_TABLET | Freq: Every day | ORAL | Status: DC
  Administered 2018-04-07 – 2018-04-09 (×3): 50 mg via ORAL
  Filled 2018-04-07 (×3): qty 1

## 2018-04-07 MED ORDER — DULOXETINE HCL 30 MG PO CPEP
30.0000 mg | ORAL_CAPSULE | Freq: Every day | ORAL | Status: DC
  Administered 2018-04-08 – 2018-04-10 (×3): 30 mg via ORAL
  Filled 2018-04-07 (×3): qty 1

## 2018-04-07 MED ORDER — METOPROLOL SUCCINATE ER 25 MG PO TB24
12.5000 mg | ORAL_TABLET | Freq: Every day | ORAL | Status: DC
  Administered 2018-04-07 – 2018-04-08 (×2): 12.5 mg via ORAL
  Filled 2018-04-07 (×2): qty 1

## 2018-04-07 MED ORDER — APIXABAN 2.5 MG PO TABS
2.5000 mg | ORAL_TABLET | Freq: Two times a day (BID) | ORAL | Status: DC
  Administered 2018-04-07 – 2018-04-10 (×7): 2.5 mg via ORAL
  Filled 2018-04-07 (×4): qty 1

## 2018-04-07 MED ORDER — ALLOPURINOL 100 MG PO TABS
100.0000 mg | ORAL_TABLET | Freq: Every day | ORAL | Status: DC
  Administered 2018-04-08 – 2018-04-10 (×3): 100 mg via ORAL
  Filled 2018-04-07 (×3): qty 1

## 2018-04-07 MED ORDER — LEVOTHYROXINE SODIUM 100 MCG PO TABS
100.0000 ug | ORAL_TABLET | Freq: Every day | ORAL | Status: DC
  Administered 2018-04-08 – 2018-04-10 (×3): 100 ug via ORAL
  Filled 2018-04-07 (×3): qty 1

## 2018-04-07 MED ORDER — HYDRALAZINE HCL 20 MG/ML IJ SOLN
5.0000 mg | Freq: Four times a day (QID) | INTRAMUSCULAR | Status: DC | PRN
  Administered 2018-04-07 – 2018-04-08 (×3): 5 mg via INTRAVENOUS
  Filled 2018-04-07 (×3): qty 1

## 2018-04-07 MED ORDER — LEVOTHYROXINE SODIUM 100 MCG PO TABS: 100.0000 ug | ORAL_TABLET | Freq: Every day | ORAL | Status: DC

## 2018-04-07 MED ORDER — DICLOFENAC SODIUM 1 % TD GEL
2.0000 g | Freq: Four times a day (QID) | TRANSDERMAL | Status: DC | PRN
  Filled 2018-04-07: qty 100

## 2018-04-07 NOTE — Progress Notes (Signed)
Subjective/Chief Complaint: No complaints other than soreness   Objective: Vital signs in last 24 hours: Temp:  [98.2 F (36.8 C)-99.8 F (37.7 C)] 98.8 F (37.1 C) (12/07 0559) Pulse Rate:  [79-84] 79 (12/07 0559) Resp:  [20] 20 (12/07 0559) BP: (132-161)/(45-50) 161/48 (12/07 0559) SpO2:  [89 %-94 %] 93 % (12/07 0559) Last BM Date: 04/06/18  Intake/Output from previous day: 12/06 0701 - 12/07 0700 In: 1785.9 [I.V.:1785.9] Out: 0  Intake/Output this shift: No intake/output data recorded.  General appearance: alert and cooperative Resp: clear to auscultation bilaterally Cardio: regular rate and rhythm GI: soft, nontender  Lab Results:  Recent Labs    04/06/18 1209 04/07/18 0618  WBC 13.2* 8.4  HGB 10.3* 9.3*  HCT 33.3* 30.0*  PLT 102* 103*   BMET Recent Labs    04/06/18 0608 04/07/18 0618  NA 137 140  K 4.6 4.1  CL 102 106  CO2 26 28  GLUCOSE 119* 94  BUN 28* 26*  CREATININE 1.47* 1.26*  CALCIUM 7.8* 8.2*   PT/INR Recent Labs    04/05/18 1624  LABPROT 13.0  INR 0.99   ABG No results for input(s): PHART, HCO3 in the last 72 hours.  Invalid input(s): PCO2, PO2  Studies/Results: Ct Abdomen Pelvis Wo Contrast  Result Date: 04/05/2018 CLINICAL DATA:  Per patient's daughter pt woke up this am with upper abd pain and nausea Pain became diffuse today and still nauseated, has some back pain Originally ordered as a dissection but changed due to low GFR No oral Hx of hernia and hysterectomy No hx of CA EXAM: CT ABDOMEN AND PELVIS WITHOUT CONTRAST TECHNIQUE: Multidetector CT imaging of the abdomen and pelvis was performed following the standard protocol without IV contrast. COMPARISON:  CT of the abdomen and pelvis on 11/02/2015 FINDINGS: Lower chest: Scattered emphysematous changes are present. There is bronchiectasis in the LEFT and RIGHT LOWER lobes. Patient has cardiac lead to the RIGHT ventricle. There is atherosclerotic calcification of the coronary  arteries. Hepatobiliary: Liver attenuation is diffusely low, consistent with hepatic steatosis. No focal liver lesions are identified. The gallbladder is distended and contains a large gallstone which measures 2.4 x 3.3 centimeters. Pancreas: There is peripancreatic stranding which extends to the root of the mesentery and the anterior pararenal space. No focal fluid collections. No pseudocyst or abscess. Spleen: Normal in size without focal abnormality. Adrenals/Urinary Tract: Adrenals are normal in appearance. There is a benign cyst in the LOWER pole the RIGHT kidney. LEFT kidney is unremarkable. The ureters are normal in appearance. The bladder and visualized portion of the urethra are normal. Stomach/Bowel: The stomach and small bowel loops are normal in appearance. There are scattered colonic diverticula but no acute diverticulitis. Significant rectosigmoid stool. No evidence for acute appendicitis. Vascular/Lymphatic: There is dense atherosclerotic calcification of the abdominal aorta not associated with aneurysm. No retroperitoneal or mesenteric adenopathy. Reproductive: Hysterectomy.  No adnexal mass. Other: Numerous fat containing anterior abdominal wall hernias are identified below the level of the umbilicus no evidence for associated bowel obstruction. Musculoskeletal: Significant degenerative changes in the lumbar spine, associated with scoliosis, convex to the LEFT. Remote wedge compression fracture of L2 with approximately 50% loss of vertebral body height. Extensive facet hypertrophy in the LOWER lumbar levels. IMPRESSION: 1. Acute pancreatitis.  No evidence for abscess or pseudocyst. 2. Hepatic steatosis. 3. Distended gallbladder with large 3.3 centimeter stone. 4. Colonic diverticulosis. 5.  Aortic atherosclerosis.  (ICD10-I70.0) 6. Hysterectomy. 7. Numerous anterior abdominal wall fat containing hernias.  8. Remote L2 compression fracture. 9. RIGHT renal cyst. 10. Bilateral LOWER lobe bronchiectasis.  11. Coronary artery disease. Electronically Signed   By: Nolon Nations M.D.   On: 04/05/2018 18:43    Anti-infectives: Anti-infectives (From admission, onward)   Start     Dose/Rate Route Frequency Ordered Stop   04/05/18 1845  piperacillin-tazobactam (ZOSYN) IVPB 3.375 g     3.375 g 100 mL/hr over 30 Minutes Intravenous  Once 04/05/18 1843 04/05/18 1931      Assessment/Plan: s/p * No surgery found * Gallstone pancreatitis  Clinically improving No plan for surgery at this point  LOS: 2 days    [name] III 04/07/2018

## 2018-04-07 NOTE — Progress Notes (Signed)
[facility] PROGRESS NOTE Subjective Patient interviewed with her granddaughter serving as interpreter.  She is having no further pain.  Has been seen by surgery who feels that cholecystectomy not indicated unless she continues to have problems.  She clinically is clearly getting better.  Objective: Vital signs in last 24 hours: Temp:  [98.5 F (36.9 C)-99.8 F (37.7 C)] 98.8 F (37.1 C) (12/07 0559) Pulse Rate:  [79-84] 79 (12/07 0559) Resp:  [20] 20 (12/07 0559) BP: (132-161)/(45-48) 161/48 (12/07 0559) SpO2:  [89 %-93 %] 93 % (12/07 0559) Last BM Date: 04/06/18  Intake/Output from previous day: 12/06 0701 - 12/07 0700 In: 1785.9 [I.V.:1785.9] Out: 0  Intake/Output this shift: Total I/O In: 675.6 [I.V.:675.6] Out: -   PE: General--female in no acute distress  Lungs--clear Abdomen--soft and nontender  Lab Results: Recent Labs    04/05/18 1624 04/06/18 0608 04/06/18 1209 04/07/18 0618  WBC 13.9* 15.2* 13.2* 8.4  HGB 11.3* 10.2* 10.3* 9.3*  HCT 35.2* 32.7* 33.3* 30.0*  PLT 149* 122* 102* 103*   BMET Recent Labs    04/05/18 1624 04/06/18 0608 04/07/18 0618  NA 137 137 140  K 4.6 4.6 4.1  CL 99 102 106  CO2 26 26 28   CREATININE 1.50* 1.47* 1.26*   LFT Recent Labs    04/05/18 1624 04/06/18 0608 04/07/18 0618  PROT 5.7* 4.9* 4.6*  AST 231* 119* 59*  ALT 135* 95* 66*  ALKPHOS 242* 176* 154*  BILITOT 4.0* 1.9* 1.0   PT/INR Recent Labs    04/05/18 1624  LABPROT 13.0  INR 0.99   PANCREAS Recent Labs    04/05/18 1624 04/07/18 0618  LIPASE 876* 70*         Studies/Results: Ct Abdomen Pelvis Wo Contrast  Result Date: 04/05/2018 CLINICAL DATA:  Per patient's daughter pt woke up this am with upper abd pain and nausea Pain became diffuse today and still nauseated, has some back pain Originally ordered as a dissection but changed due to low GFR No oral Hx of hernia and hysterectomy No hx of CA EXAM: CT ABDOMEN AND PELVIS WITHOUT  CONTRAST TECHNIQUE: Multidetector CT imaging of the abdomen and pelvis was performed following the standard protocol without IV contrast. COMPARISON:  CT of the abdomen and pelvis on 11/02/2015 FINDINGS: Lower chest: Scattered emphysematous changes are present. There is bronchiectasis in the LEFT and RIGHT LOWER lobes. Patient has cardiac lead to the RIGHT ventricle. There is atherosclerotic calcification of the coronary arteries. Hepatobiliary: Liver attenuation is diffusely low, consistent with hepatic steatosis. No focal liver lesions are identified. The gallbladder is distended and contains a large gallstone which measures 2.4 x 3.3 centimeters. Pancreas: There is peripancreatic stranding which extends to the root of the mesentery and the anterior pararenal space. No focal fluid collections. No pseudocyst or abscess. Spleen: Normal in size without focal abnormality. Adrenals/Urinary Tract: Adrenals are normal in appearance. There is a benign cyst in the LOWER pole the RIGHT kidney. LEFT kidney is unremarkable. The ureters are normal in appearance. The bladder and visualized portion of the urethra are normal. Stomach/Bowel: The stomach and small bowel loops are normal in appearance. There are scattered colonic diverticula but no acute diverticulitis. Significant rectosigmoid stool. No evidence for acute appendicitis. Vascular/Lymphatic: There is dense atherosclerotic calcification of the abdominal aorta not associated with aneurysm. No retroperitoneal or mesenteric adenopathy. Reproductive: Hysterectomy.  No adnexal mass. Other: Numerous fat containing anterior abdominal wall hernias are identified below the level of the umbilicus no evidence  for associated bowel obstruction. Musculoskeletal: Significant degenerative changes in the lumbar spine, associated with scoliosis, convex to the LEFT. Remote wedge compression fracture of L2 with approximately 50% loss of vertebral body height. Extensive facet hypertrophy  in the LOWER lumbar levels. IMPRESSION: 1. Acute pancreatitis.  No evidence for abscess or pseudocyst. 2. Hepatic steatosis. 3. Distended gallbladder with large 3.3 centimeter stone. 4. Colonic diverticulosis. 5.  Aortic atherosclerosis.  (ICD10-I70.0) 6. Hysterectomy. 7. Numerous anterior abdominal wall fat containing hernias. 8. Remote L2 compression fracture. 9. RIGHT renal cyst. 10. Bilateral LOWER lobe bronchiectasis. 11. Coronary artery disease. Electronically Signed   By: Nolon Nations M.D.   On: 04/05/2018 18:43    Medications: I have reviewed the patient's current medications.  Assessment:   1.  Gallstone pancreatitis.  Appears to be improving in every way.  LFTs returning to normal.  See no reason for ERCP at this time and agree completely with surgery that very likely she will never have any further episodes and that her surgical risk would be fairly high at her age.   Plan: We will sign off now but will be available if things change in ERCP as needed.  Have discussed all this with the patient and granddaughter.   Nancy Fetter 04/07/2018, 3:43 PM  This note was created using voice recognition software. Minor errors may Have occurred unintentionally.  Pager: [phone] If no answer or after hours call [phone]

## 2018-04-07 NOTE — Progress Notes (Signed)
PROGRESS NOTE    JULIAH [name]  WCB:[id number] DOB: 1920/02/20 DOA: 04/05/2018 PCP: McDiarmid, Blane Ohara, MD      Brief Narrative:  [name] is a 84 y.o. F with dementia, home-dwelling, HTN, dCHF, Afib with pacer, on Eliquis, and hx bronchiectasis who presents with epigastric pain.  Found in ER to have lipase >800, LFTs 200s, CT with pancreatitis and gallstones up to 3cm and dilated CBD, no cholecystitis.  GI and Gen Surg consulted.       Assessment & Plan:  Acute gallstone pancreatitis Admitted and made NPO, IVF started.  MRCP deferred given pacer.  Evaluated by GI and Gen Surg, who recommended that if symptoms/labs normalize, the most prudent course would be to avoid surgery, as risk of surgery outweighs risk of recurrent pancreatitis given her age, comorbidities, dementia.    Lipase now almost completely resolved, AST and ALT both improving, total bilirubin normalized.  Pain completely resolved.  Patient hungry.  Abdomen soft and non-tender. -Start clears, ADAT -Stop IVF   Hypertension Chronic diastolic CHF Appears euvolemic.  Not on diuretics.  Blood pressure normal. -Restart metoprolol  Atrial fibrillation, paroxysmal Pacemaker in place Chads 2 vascular least 5.  On Eliquis at home.  Has been started on heparin here.  Given no plans for intervention, will stop heparin and restart Eliquis. -Stop heparin -Restart Eliquis -Continue metoprolol  Gout -Continue allopurinol  Anxiety Dementia -Continue duloxetine, trazodone  Hypothyroidism -Continue levothyroxine, transition back to oral  Other medications -Continue pantoprazole, fesoterodine  CKD III: Baseline Cr 1.2-1.4.  Stable relative to abseline  Anemia of chronic diease Hgb stable relative to baseline, ~10 g/dL     MDM and disposition: The below labs and imaging reports were reviewed and summarized above.  Medication management as above.  The patient was admitted with gallstone pancreatitis.  Thankfully,  her Bili, LFTs, and lipase have normalized, mirroring her resolved clinical symptoms.    All in all, she is a high-risk surgical candidate, and her surgical risks are felt to outweigh her risk of future pancreatitis.  This was discusseed with daughter by phone.  History obtained via videophonic interpreter.     DVT prophylaxis: N/A on ELiquis Code Status: FULL Family Communication: Daughter by phone    Consultants:   GI  Gen Surg  Procedures:   CT abdomen  Antimicrobials:   None    Subjective: Feelig well.  Abdomen fine, no more pain.  Mostly she is complaining of back pain, hip and knee pain, which are chronic.  No vomiting.  No fever.  No leg swelling.  No chest pain.  Objective: Vitals:   04/06/18 1430 04/07/18 0024 04/07/18 0200 04/07/18 0559  BP: (!) 147/50 (!) 132/45  (!) 161/48  Pulse: 81 84  79  Resp: 20 20  20   Temp: 98.2 F (36.8 C) 98.5 F (36.9 C) 99.8 F (37.7 C) 98.8 F (37.1 C)  TempSrc:   Oral Oral  SpO2: 94% (!) 89%  93%  Height:        Intake/Output Summary (Last 24 hours) at 04/07/2018 1540 Last data filed at 04/07/2018 1330 Gross per 24 hour  Intake 2159.9 ml  Output -  Net 2159.9 ml   There were no vitals filed for this visit.  Examination: General appearance: Thin elderly adult female, alert and in no acute distress.   HEENT: Anicteric, conjunctiva pink, lids and lashes normal. No nasal deformity, discharge, epistaxis.  Lips moist.   Skin: Warm and dry.  no jaundice.  No suspicious rashes or lesions. Cardiac: RRR, nl S1-S2, no murmurs appreciated.  Capillary refill is brisk.  JVP not visible.  No LE edema.  Radia  pulses 2+ and symmetric. Respiratory: Normal respiratory rate and rhythm.  CTAB without rales or wheezes. Abdomen: Abdomen soft.  no TTP at all. No ascites, distension, hepatosplenomegaly.   MSK: No deformities or effusions. Neuro: Awake and alert.  EOMI, moves all extremities. Speech fluent.    Psych: Sensorium intact  and responding to questions, attention normal. Affect pleasant.  Judgment and insight appear impaired.    Data Reviewed: I have personally reviewed following labs and imaging studies:  CBC: Recent Labs  Lab 04/05/18 1624 04/06/18 0608 04/06/18 1209 04/07/18 0618  WBC 13.9* 15.2* 13.2* 8.4  NEUTROABS 12.1*  --  10.7*  --   HGB 11.3* 10.2* 10.3* 9.3*  HCT 35.2* 32.7* 33.3* 30.0*  MCV 105.4* 103.5* 107.8* 106.0*  PLT 149* 122* 102* 160*   Basic Metabolic Panel: Recent Labs  Lab 04/05/18 1624 04/06/18 0608 04/07/18 0618  NA 137 137 140  K 4.6 4.6 4.1  CL 99 102 106  CO2 26 26 28   GLUCOSE 110* 119* 94  BUN 25* 28* 26*  CREATININE 1.50* 1.47* 1.26*  CALCIUM 8.8* 7.8* 8.2*   GFR: CrCl cannot be calculated (Unknown ideal weight.). Liver Function Tests: Recent Labs  Lab 04/05/18 1624 04/06/18 0608 04/07/18 0618  AST 231* 119* 59*  ALT 135* 95* 66*  ALKPHOS 242* 176* 154*  BILITOT 4.0* 1.9* 1.0  PROT 5.7* 4.9* 4.6*  ALBUMIN 3.0* 2.6* 2.6*   Recent Labs  Lab 04/05/18 1624 04/07/18 0618  LIPASE 876* 70*   No results for input(s): AMMONIA in the last 168 hours. Coagulation Profile: Recent Labs  Lab 04/05/18 1624  INR 0.99   Cardiac Enzymes: No results for input(s): CKTOTAL, CKMB, CKMBINDEX, TROPONINI in the last 168 hours. BNP (last 3 results) No results for input(s): PROBNP in the last 8760 hours. HbA1C: No results for input(s): HGBA1C in the last 72 hours. CBG: No results for input(s): GLUCAP in the last 168 hours. Lipid Profile: No results for input(s): CHOL, HDL, LDLCALC, TRIG, CHOLHDL, LDLDIRECT in the last 72 hours. Thyroid Function Tests: No results for input(s): TSH, T4TOTAL, FREET4, T3FREE, THYROIDAB in the last 72 hours. Anemia Panel: No results for input(s): VITAMINB12, FOLATE, FERRITIN, TIBC, IRON, RETICCTPCT in the last 72 hours. Urine analysis:    Component Value Date/Time   COLORURINE AMBER (A) 04/05/2018 1531   APPEARANCEUR CLEAR  04/05/2018 1531   LABSPEC 1.014 04/05/2018 1531   PHURINE 5.0 04/05/2018 1531   GLUCOSEU NEGATIVE 04/05/2018 1531   HGBUR SMALL (A) 04/05/2018 1531   HGBUR small 12/17/2007 1124   BILIRUBINUR NEGATIVE 04/05/2018 1531   BILIRUBINUR negative 02/08/2018 1640   BILIRUBINUR NEG 05/26/2016 1700   KETONESUR NEGATIVE 04/05/2018 1531   PROTEINUR 30 (A) 04/05/2018 1531   UROBILINOGEN 0.2 02/08/2018 1640   UROBILINOGEN 0.2 08/29/2013 2142   NITRITE NEGATIVE 04/05/2018 1531   LEUKOCYTESUR NEGATIVE 04/05/2018 1531   Sepsis Labs: @LABRCNTIP (procalcitonin:4,lacticacidven:4)  ) Recent Results (from the past 240 hour(s))  Urine culture     Status: Abnormal (Preliminary result)   Collection Time: 04/05/18  3:41 PM  Result Value Ref Range Status   Specimen Description   Final    URINE, CLEAN CATCH Performed at [facility], [place] [address] [place], [place] [address]    Special Requests   Final    NONE Performed at [facility]  [place] [address] [place], [place] [address]    Culture (A)  Final    10,000 COLONIES/mL ESCHERICHIA COLI 10,000 COLONIES/mL UNIDENTIFIED ORGANISM Performed at [facility], [place] [address] [place], [place] [address]    Report Status PENDING  Incomplete  Culture, blood (routine x 2)     Status: None (Preliminary result)   Collection Time: 04/06/18 12:09 PM  Result Value Ref Range Status   Specimen Description   Final    BLOOD RIGHT ARM Performed at [facility] [address], [place], [place] [address]    Special Requests   Final    BOTTLES DRAWN AEROBIC ONLY Blood Culture results may not be optimal due to an inadequate volume of blood received in culture bottles Performed at [facility] [address], [place], [place] [address]    Culture   Final    NO GROWTH 1 DAY Performed at [facility], [place] [address] [place], [place] [address]    Report Status PENDING   Incomplete  Culture, blood (routine x 2)     Status: None (Preliminary result)   Collection Time: 04/06/18 12:09 PM  Result Value Ref Range Status   Specimen Description   Final    BLOOD RIGHT HAND Performed at [facility] [address], [place], [place] [address]    Special Requests   Final    BOTTLES DRAWN AEROBIC AND ANAEROBIC Blood Culture results may not be optimal due to an inadequate volume of blood received in culture bottles Performed at [facility] [address] [place], [place] [address]    Culture   Final    NO GROWTH 1 DAY Performed at [facility], [place] [address], [place],  [address]    Report Status PENDING  Incomplete         Radiology Studies: Ct Abdomen Pelvis Wo Contrast  Result Date: 04/05/2018 CLINICAL DATA:  Per patient's daughter pt woke up this am with upper abd pain and nausea Pain became diffuse today and still nauseated, has some back pain Originally ordered as a dissection but changed due to low GFR No oral Hx of hernia and hysterectomy No hx of CA EXAM: CT ABDOMEN AND PELVIS WITHOUT CONTRAST TECHNIQUE: Multidetector CT imaging of the abdomen and pelvis was performed following the standard protocol without IV contrast. COMPARISON:  CT of the abdomen and pelvis on 11/02/2015 FINDINGS: Lower chest: Scattered emphysematous changes are present. There is bronchiectasis in the LEFT and RIGHT LOWER lobes. Patient has cardiac lead to the RIGHT ventricle. There is atherosclerotic calcification of the coronary arteries. Hepatobiliary: Liver attenuation is diffusely low, consistent with hepatic steatosis. No focal liver lesions are identified. The gallbladder is distended and contains a large gallstone which measures 2.4 x 3.3 centimeters. Pancreas: There is peripancreatic stranding which extends to the root of the mesentery and the anterior pararenal space. No focal fluid collections. No pseudocyst or abscess.  Spleen: Normal in size without focal abnormality. Adrenals/Urinary Tract: Adrenals are normal in appearance. There is a benign cyst in the LOWER pole the RIGHT kidney. LEFT kidney is unremarkable. The ureters are normal in appearance. The bladder and visualized portion of the urethra are normal. Stomach/Bowel: The stomach and small bowel loops are normal in appearance. There are scattered colonic diverticula but no acute diverticulitis. Significant rectosigmoid stool. No evidence for acute appendicitis. Vascular/Lymphatic: There is dense atherosclerotic calcification of the abdominal aorta not  associated with aneurysm. No retroperitoneal or mesenteric adenopathy. Reproductive: Hysterectomy.  No adnexal mass. Other: Numerous fat containing anterior abdominal wall hernias are identified below the level of the umbilicus no evidence for associated bowel obstruction. Musculoskeletal: Significant degenerative changes in the lumbar spine, associated with scoliosis, convex to the LEFT. Remote wedge compression fracture of L2 with approximately 50% loss of vertebral body height. Extensive facet hypertrophy in the LOWER lumbar levels. IMPRESSION: 1. Acute pancreatitis.  No evidence for abscess or pseudocyst. 2. Hepatic steatosis. 3. Distended gallbladder with large 3.3 centimeter stone. 4. Colonic diverticulosis. 5.  Aortic atherosclerosis.  (ICD10-I70.0) 6. Hysterectomy. 7. Numerous anterior abdominal wall fat containing hernias. 8. Remote L2 compression fracture. 9. RIGHT renal cyst. 10. Bilateral LOWER lobe bronchiectasis. 11. Coronary artery disease. Electronically Signed   By: Nolon Nations M.D.   On: 04/05/2018 18:43        Scheduled Meds: . [START ON 04/08/2018] allopurinol  100 mg Oral Daily  . apixaban  2.5 mg Oral BID  . [START ON 04/08/2018] DULoxetine  30 mg Oral Daily  . [START ON 04/08/2018] fesoterodine  4 mg Oral Daily  . [START ON 04/08/2018] levothyroxine  100 mcg Oral Daily  . metoprolol  succinate  12.5 mg Oral Daily  . [START ON 04/08/2018] pantoprazole  40 mg Oral Daily  . traZODone  50 mg Oral QHS   Continuous Infusions:   LOS: 2 days    Time spent: 18minutes    Edwin Dada, MD [facility] 04/07/2018, 3:40 PM     Please page through [place]:  [email or web address] Password [email or web address] If 7PM-7AM, please contact night-coverage

## 2018-04-07 NOTE — Progress Notes (Signed)
[facility] for Heparin >> Eliquis Indication: atrial fibrillation  Allergies  Allergen Reactions  . Chlorthalidone Other (See Comments)    Hyponatremia  . Honey Bee Treatment [Bee Venom] Swelling and Rash  . Verapamil Other (See Comments)    Bradycardia - HR  35-45 on 40mg  TID  . Adhesive [Tape] Rash and Other (See Comments)    Skin irritation  . Amlodipine Other (See Comments)    Peripheral edema with 2.5mg  daily dose    Patient Measurements: Height: 5\' 2"  (157.5 cm) IBW/kg (Calculated) : 50.1 Heparin Dosing Weight: 52 kg weight from 10/19  Vital Signs: Temp: 98.8 F (37.1 C) (12/07 0559) Temp Source: Oral (12/07 0559) BP: 161/48 (12/07 0559) Pulse Rate: 79 (12/07 0559)  Labs: Recent Labs    04/05/18 1624  04/05/18 2257 04/06/18 1224 04/06/18 0846 04/06/18 1209 04/06/18 1908 04/07/18 0618 04/07/18 0637  HGB 11.3*  --   --  10.2*  --  10.3*  --  9.3*  --   HCT 35.2*  --   --  32.7*  --  33.3*  --  30.0*  --   PLT 149*  --   --  122*  --  102*  --  103*  --   APTT  --    < > 34  --  106*  --  118*  --  72*  LABPROT 13.0  --   --   --   --   --   --   --   --   INR 0.99  --   --   --   --   --   --   --   --   HEPARINUNFRC  --   --  0.88*  --   --   --   --  0.52  --   CREATININE 1.50*  --   --  1.47*  --   --   --  1.26*  --    < > = values in this interval not displayed.    CrCl cannot be calculated (Unknown ideal weight.).  Medications:  Apixaban 2.5 mg bid pta last 12/4 PM  Assessment: 84 y/o F with a h/o atrial fibrillation on Eliquis PTA admitted with possible gallstone pancreatitis to transition to heparin drip while NPO.   Today 04/07/18   HL/aPTT at goal  Transitioning back to Eliquis today  Hbg, Plt decreased slightly likely dilutional  No bleeding or line issues per RN  Goal of Therapy:  Monitor platelets by anticoagulation protocol: Yes   Plan:   Resume Eliquis 2.5 mg PO bid at lunch  Stop  heparin with first dose of Eliquis  Continue to monitor H&H and platelets and for s/s bleeding  Reuel Boom, PharmD, BCPS [phone] 04/07/2018, 1:41 PM

## 2018-04-08 ENCOUNTER — Inpatient Hospital Stay (HOSPITAL_COMMUNITY): Payer: Medicare Other

## 2018-04-08 DIAGNOSIS — I498 Other specified cardiac arrhythmias: Secondary | ICD-10-CM

## 2018-04-08 DIAGNOSIS — Z950 Presence of cardiac pacemaker: Secondary | ICD-10-CM

## 2018-04-08 DIAGNOSIS — I480 Paroxysmal atrial fibrillation: Secondary | ICD-10-CM

## 2018-04-08 LAB — COMPREHENSIVE METABOLIC PANEL
ALT: 53 U/L — ABNORMAL HIGH (ref 0–44)
AST: 39 U/L (ref 15–41)
Albumin: 2.6 g/dL — ABNORMAL LOW (ref 3.5–5.0)
Alkaline Phosphatase: 168 U/L — ABNORMAL HIGH (ref 38–126)
Anion gap: 6 (ref 5–15)
BUN: 16 mg/dL (ref 8–23)
CO2: 28 mmol/L (ref 22–32)
Calcium: 8.5 mg/dL — ABNORMAL LOW (ref 8.9–10.3)
Chloride: 103 mmol/L (ref 98–111)
Creatinine, Ser: 0.86 mg/dL (ref 0.44–1.00)
GFR calc Af Amer: >60 mL/min (ref >60)
GFR calc non Af Amer: 57 mL/min — ABNORMAL LOW (ref >60)
Glucose, Bld: 68 mg/dL — ABNORMAL LOW (ref 70–99)
POTASSIUM: 3.9 mmol/L (ref 3.5–5.1)
Sodium: 137 mmol/L (ref 135–145)
Total Bilirubin: 0.9 mg/dL (ref 0.3–1.2)
Total Protein: 4.9 g/dL — ABNORMAL LOW (ref 6.5–8.1)

## 2018-04-08 LAB — URINE CULTURE

## 2018-04-08 LAB — GLUCOSE, CAPILLARY
GLUCOSE-CAPILLARY: 95 mg/dL (ref 70–99)
GLUCOSE-CAPILLARY: 84 mg/dL (ref 70–99)

## 2018-04-08 MED ORDER — IOHEXOL 300 MG/ML  SOLN
75.0000 mL | Freq: Once | INTRAMUSCULAR | Status: AC | PRN
  Administered 2018-04-08: 75 mL via INTRAVENOUS

## 2018-04-08 MED ORDER — FUROSEMIDE 40 MG PO TABS
40.0000 mg | ORAL_TABLET | Freq: Once | ORAL | Status: AC
  Administered 2018-04-08: 40 mg via ORAL
  Filled 2018-04-08: qty 1

## 2018-04-08 MED ORDER — METOPROLOL SUCCINATE ER 25 MG PO TB24
25.0000 mg | ORAL_TABLET | Freq: Every day | ORAL | Status: DC
  Administered 2018-04-09 – 2018-04-10 (×2): 25 mg via ORAL
  Filled 2018-04-08 (×2): qty 1

## 2018-04-08 MED ORDER — IOPAMIDOL (ISOVUE-300) INJECTION 61%: 75.0000 mL | Freq: Once | INTRAVENOUS | Status: DC | PRN

## 2018-04-08 MED ORDER — SODIUM CHLORIDE (PF) 0.9 % IJ SOLN
INTRAMUSCULAR | Status: AC
  Filled 2018-04-08: qty 50

## 2018-04-08 NOTE — Progress Notes (Signed)
PROGRESS NOTE    Jordan [name]  HDQ:[id number] DOB: [birth year] DOA: 04/05/2018 PCP: McDiarmid, Blane Ohara, MD      Brief Narrative:  Mrs. [place] is a 84 y.o. F with dementia, home-dwelling, HTN, dCHF, Afib with pacer, on Eliquis, and hx bronchiectasis who presents with epigastric pain.  Found in ER to have lipase >800, LFTs 200s, CT with pancreatitis and gallstones up to 3cm and dilated CBD, no cholecystitis.  GI and Gen Surg consulted.       Assessment & Plan:  Acute gallstone pancreatitis Admitted and made NPO, IVF started.  MRCP deferred given pacer.  Evaluated by GI and Gen Surg, who recommended that if symptoms/labs normalize, the most prudent course would be to avoid surgery, as risk of surgery outweighs risk of recurrent pancreatitis given her age, comorbidities, dementia.    Lipase, AST, ALT improved. AST and ALT and bili normal now.  Pain returned today with attempting soft diet and she vomited.    -Clear liquid diet, bowel rest   Rales Abnormal chest x-ray No aspiration clinically.  However, rales on exam this morning.  CXR shows chronic interstitial change no edema, but indeterminate rounded mass in LLL, ?mass vs effusion, diaphragm.  Radiology recommend CT with contrast to follow up.  No hypoxia or dyspnea. -CT chest with contrast ordered  Hypertension Chronic diastolic CHF No swelling.  Takes diuretics for leg swelling PRN at home.  BP elevated.    No improvement with hydralazine -Continue metoprolol, increase dose -Given furosemide once  Atrial fibrillation, paroxysmal Pacemaker in place Chads 2 vascular least 5.  On Eliquis at home.  Heparin has been stopped. -Continue Eliquis -Continue metoprolol  Gout -Continue allopurinol  Anxiety Dementia -Continue duloxetine, trazodone  Hypothyroidism -Continue levothyroxine  Other medications -Continue pantoprazole, fesoterodine  Acute kidney injury: Creatinine 1.5 at admission, resolved to 0.9 with  fluids.  Anemia of chronic diease Hgb stable relative to baseline, ~10 g/dL     MDM and disposition: Below labs and imaging reports were reviewed and summarized above.  Medication management, including management of anticoagulation as above.  Chest x-ray was personally reviewed and shows a rounded left diaphragm, possibly effusion or mass.  CT chest ordered.  The patient was admitted with gallstone pancreatitis.  Thankfully, her bilirubin, LFTs, and lipase have normalized.  We attempted to advance her diet today, but she had vomiting.  We will resume clear liquids, bowel rest, analgesia, antiemetics for pancreatitis.  With regard to long-term plan, she is a high-risk surgical candidate, and her surgical risks are felt to outweigh her risk of future pancreatitis.    History obtained via videophonic interpreter.        DVT prophylaxis: N/A on ELiquis Code Status: FULL Family Communication: Son and daughter at the bedside    Consultants:   GI  Gen Surg  Procedures:   CT abdomen  CT chest  Antimicrobials:   None    Subjective: F more tired today, vague malaise.  The patient is too demented to provide independent history, but nursing and son note vomiting after eating today.  She has rales, but no dyspnea.  No fever, no leg swelling.  No complaints of pain other than in her low back.     Objective: Vitals:   04/08/18 1012 04/08/18 1144 04/08/18 1336 04/08/18 1439  BP: (!) 182/74 (!) 191/69 (!) 180/75 (!) 174/65  Pulse: 88 81 86 85  Resp:    16  Temp:    98.2 F (36.8 C)  TempSrc:    Oral  SpO2:  96% 95% 94%  Height:       No intake or output data in the 24 hours ending 04/08/18 1501 There were no vitals filed for this visit.  Examination: General appearance: An elderly female, lying in bed, appears tired, less interactive than yesterday.   HEENT: Anicteric, conjunctival pink, lids and lashes normal.  No nasal deformity, discharge, epistaxis.  Oropharynx  moist, no oral lesions.  Hearing diminished. Skin: Skin warm and dry, no jaundice, no suspicious rashes or lesions. Cardiac: Regular rate and rhythm, no murmurs appreciated, JVP normal, no lower extremity edema. Respiratory: Normal respiratory effort, rales bilaterally, more in the left base.  No wheezing. Abdomen: Soft, without tenderness to palpation or guarding.  No distention or hepatosplenomegaly. MSK: Fomites or effusions of the large joints bilaterally.  Diffuse loss of subcutaneous muscle mass and fat. Neuro: Awake and alert, extraocular movements intact, gait shuffling, moves all extremities with 5-/5 strength, normal coordination, speech fluent.    Psych: Sensorium intact responding to questions, attention diminshed, affect blunted, attention diminished.  Judgment and insight appear impaired by dementia.  Knows she is in hospital, generally recognizes staff, but unable to reliably explain her condition, her symptoms.    Data Reviewed: I have personally reviewed following labs and imaging studies:  CBC: Recent Labs  Lab 04/05/18 1624 04/06/18 0608 04/06/18 1209 04/07/18 0618  WBC 13.9* 15.2* 13.2* 8.4  NEUTROABS 12.1*  --  10.7*  --   HGB 11.3* 10.2* 10.3* 9.3*  HCT 35.2* 32.7* 33.3* 30.0*  MCV 105.4* 103.5* 107.8* 106.0*  PLT 149* 122* 102* 250*   Basic Metabolic Panel: Recent Labs  Lab 04/05/18 1624 04/06/18 0608 04/07/18 0618 04/08/18 0630  NA 137 137 140 137  K 4.6 4.6 4.1 3.9  CL 99 102 106 103  CO2 26 26 28 28   GLUCOSE 110* 119* 94 68*  BUN 25* 28* 26* 16  CREATININE 1.50* 1.47* 1.26* 0.86  CALCIUM 8.8* 7.8* 8.2* 8.5*   GFR: CrCl cannot be calculated (Unknown ideal weight.). Liver Function Tests: Recent Labs  Lab 04/05/18 1624 04/06/18 0608 04/07/18 0618 04/08/18 0630  AST 231* 119* 59* 39  ALT 135* 95* 66* 53*  ALKPHOS 242* 176* 154* 168*  BILITOT 4.0* 1.9* 1.0 0.9  PROT 5.7* 4.9* 4.6* 4.9*  ALBUMIN 3.0* 2.6* 2.6* 2.6*   Recent Labs  Lab  04/05/18 1624 04/07/18 0618  LIPASE 876* 70*   No results for input(s): AMMONIA in the last 168 hours. Coagulation Profile: Recent Labs  Lab 04/05/18 1624  INR 0.99   Cardiac Enzymes: No results for input(s): CKTOTAL, CKMB, CKMBINDEX, TROPONINI in the last 168 hours. BNP (last 3 results) No results for input(s): PROBNP in the last 8760 hours. HbA1C: No results for input(s): HGBA1C in the last 72 hours. CBG: Recent Labs  Lab 04/08/18 1202  GLUCAP 95   Lipid Profile: No results for input(s): CHOL, HDL, LDLCALC, TRIG, CHOLHDL, LDLDIRECT in the last 72 hours. Thyroid Function Tests: No results for input(s): TSH, T4TOTAL, FREET4, T3FREE, THYROIDAB in the last 72 hours. Anemia Panel: No results for input(s): VITAMINB12, FOLATE, FERRITIN, TIBC, IRON, RETICCTPCT in the last 72 hours. Urine analysis:    Component Value Date/Time   COLORURINE AMBER (A) 04/05/2018 1531   APPEARANCEUR CLEAR 04/05/2018 1531   LABSPEC 1.014 04/05/2018 1531   PHURINE 5.0 04/05/2018 1531   GLUCOSEU NEGATIVE 04/05/2018 1531   HGBUR SMALL (A) 04/05/2018 1531   HGBUR small 12/17/2007  Johnson 04/05/2018 1531   BILIRUBINUR negative 02/08/2018 1640   BILIRUBINUR NEG 05/26/2016 1700   KETONESUR NEGATIVE 04/05/2018 1531   PROTEINUR 30 (A) 04/05/2018 1531   UROBILINOGEN 0.2 02/08/2018 1640   UROBILINOGEN 0.2 08/29/2013 2142   NITRITE NEGATIVE 04/05/2018 1531   LEUKOCYTESUR NEGATIVE 04/05/2018 1531   Sepsis Labs: @LABRCNTIP (procalcitonin:4,lacticacidven:4)  ) Recent Results (from the past 240 hour(s))  Urine culture     Status: Abnormal   Collection Time: 04/05/18  3:41 PM  Result Value Ref Range Status   Specimen Description   Final    URINE, CLEAN CATCH Performed at [facility] [place] [address], [place], [place] [address]    Special Requests   Final    NONE Performed at [facility], [place] [address] [place], [place] [address]     Culture 10,000 COLONIES/mL ESCHERICHIA COLI (A)  Final   Report Status 04/08/2018 FINAL  Final   Organism ID, Bacteria ESCHERICHIA COLI (A)  Final      Susceptibility   Escherichia coli - MIC*    AMPICILLIN <=2 SENSITIVE Sensitive     CEFAZOLIN <=4 SENSITIVE Sensitive     CEFTRIAXONE <=1 SENSITIVE Sensitive     CIPROFLOXACIN <=0.25 SENSITIVE Sensitive     GENTAMICIN <=1 SENSITIVE Sensitive     IMIPENEM <=0.25 SENSITIVE Sensitive     NITROFURANTOIN <=16 SENSITIVE Sensitive     TRIMETH/SULFA <=20 SENSITIVE Sensitive     AMPICILLIN/SULBACTAM <=2 SENSITIVE Sensitive     PIP/TAZO <=4 SENSITIVE Sensitive     Extended ESBL NEGATIVE Sensitive     * 10,000 COLONIES/mL ESCHERICHIA COLI  Culture, blood (routine x 2)     Status: None (Preliminary result)   Collection Time: 04/06/18 12:09 PM  Result Value Ref Range Status   Specimen Description   Final    BLOOD RIGHT ARM Performed at [place] [address], [place], [place] [address]    Special Requests   Final    BOTTLES DRAWN AEROBIC ONLY Blood Culture results may not be optimal due to an inadequate volume of blood received in culture bottles Performed at [place] [address] [place], [place] [address]    Culture   Final    NO GROWTH 1 DAY Performed at [facility], [place] [address], [place], [place] [address]    Report Status PENDING  Incomplete  Culture, blood (routine x 2)     Status: None (Preliminary result)   Collection Time: 04/06/18 12:09 PM  Result Value Ref Range Status   Specimen Description   Final    BLOOD RIGHT HAND Performed at [place] [address] [place], [place] [address]    Special Requests   Final    BOTTLES DRAWN AEROBIC AND ANAEROBIC Blood Culture results may not be optimal due to an inadequate volume of blood received in culture bottles Performed at [place] [address], [place], [place] [address]     Culture   Final    NO GROWTH 1 DAY Performed at [facility], [place] [address] [place], [place] [address]    Report Status PENDING  Incomplete         Radiology Studies: Dg Chest Port 1 View  Result Date: 04/08/2018 CLINICAL DATA:  Abnormal breath sounds. EXAM: PORTABLE CHEST 1 VIEW COMPARISON:  10/02/2017 FINDINGS: The permanent pacer wires are stable. Mild cardiac enlargement is stable. There is tortuosity  and calcification of the thoracic aorta. Slightly prominent pulmonary hila are stable. Stable emphysematous changes and pulmonary scarring with biapical pleural and parenchymal density. Rounded density at the left lung base is new since prior chest films and could be rounded atelectasis, infiltrate, loculated pleural fluid collection or mass. A small left pleural effusion is suspected. The bony structures are intact. A right reversed total shoulder arthroplasty is noted. IMPRESSION: Chronic bronchitic and emphysematous lung changes. Rounded masslike density at the left lung base as detailed above. Recommend chest CT with contrast (if possible) for further evaluation. Electronically Signed   By: Marijo Sanes M.D.   On: 04/08/2018 13:20        Scheduled Meds: . allopurinol  100 mg Oral Daily  . apixaban  2.5 mg Oral BID  . DULoxetine  30 mg Oral Daily  . fesoterodine  4 mg Oral Daily  . levothyroxine  100 mcg Oral Daily  . [START ON 04/09/2018] metoprolol succinate  25 mg Oral Daily  . pantoprazole  40 mg Oral Daily  . traZODone  50 mg Oral QHS   Continuous Infusions:   LOS: 3 days    Time spent: 35 minutes    Edwin Dada, MD [facility] 04/08/2018, 3:01 PM     Please page through AMION:  [email or web address] Password [email or web address] If 7PM-7AM, please contact night-coverage

## 2018-04-09 LAB — COMPREHENSIVE METABOLIC PANEL
ALT: 42 U/L (ref 0–44)
AST: 28 U/L (ref 15–41)
Albumin: 2.8 g/dL — ABNORMAL LOW (ref 3.5–5.0)
Alkaline Phosphatase: 164 U/L — ABNORMAL HIGH (ref 38–126)
Anion gap: 10 (ref 5–15)
BUN: 18 mg/dL (ref 8–23)
CO2: 26 mmol/L (ref 22–32)
CREATININE: 1.14 mg/dL — AB (ref 0.44–1.00)
Calcium: 8.5 mg/dL — ABNORMAL LOW (ref 8.9–10.3)
Chloride: 98 mmol/L (ref 98–111)
GFR calc Af Amer: 47 mL/min — ABNORMAL LOW (ref >60)
GFR, EST NON AFRICAN AMERICAN: 41 mL/min — AB (ref >60)
Glucose, Bld: 61 mg/dL — ABNORMAL LOW (ref 70–99)
Potassium: 4.3 mmol/L (ref 3.5–5.1)
Sodium: 134 mmol/L — ABNORMAL LOW (ref 135–145)
Total Bilirubin: 1.2 mg/dL (ref 0.3–1.2)
Total Protein: 5 g/dL — ABNORMAL LOW (ref 6.5–8.1)

## 2018-04-09 LAB — CBC
HCT: 34.6 % — ABNORMAL LOW (ref 36.0–46.0)
Hemoglobin: 10.9 g/dL — ABNORMAL LOW (ref 12.0–15.0)
MCH: 31.9 pg (ref 26.0–34.0)
MCHC: 31.5 g/dL (ref 30.0–36.0)
MCV: 101.2 fL — ABNORMAL HIGH (ref 80.0–100.0)
Platelets: 141 10*3/uL — ABNORMAL LOW (ref 150–400)
RBC: 3.42 MIL/uL — ABNORMAL LOW (ref 3.87–5.11)
RDW: 14.7 % (ref 11.5–15.5)
WBC: 5.7 10*3/uL (ref 4.0–10.5)
nRBC: 0 % (ref 0.0–0.2)

## 2018-04-09 LAB — LIPASE, BLOOD: Lipase: 46 U/L (ref 11–51)

## 2018-04-09 MED ORDER — FUROSEMIDE 40 MG PO TABS
40.0000 mg | ORAL_TABLET | Freq: Once | ORAL | Status: AC
  Administered 2018-04-09: 40 mg via ORAL
  Filled 2018-04-09: qty 1

## 2018-04-09 NOTE — Evaluation (Signed)
Physical Therapy Evaluation Patient Details Name: Jordan [name] MRN: [id number] DOB: June 24, 1919 Today's Date: 04/09/2018   History of Present Illness  84 yo female admitted with gallstone pancreatitis. Hx of dementia, CHF, CKD, pacemaker, OA, chronic pain. Pt is Spanish-speaking.   Clinical Impression  On eval, pt required Min assist for mobility. She walked 40 feet with a rollator. Pt presents with general weakness, decreased activity tolerance, and impaired gait and balance. Discussed d/c plan with family-pt will return home with family assisting as needed. They are agreeable to HHPT f/u. Will follow during hospital stay.     Follow Up Recommendations Home health PT;Supervision/Assistance - 24 hour    Equipment Recommendations  None recommended by PT    Recommendations for Other Services       Precautions / Restrictions Precautions Precautions: Fall Restrictions Weight Bearing Restrictions: No      Mobility  Bed Mobility               General bed mobility comments: oob in recliner  Transfers Overall transfer level: Needs assistance Equipment used: 4-wheeled walker Transfers: Sit to/from Stand Sit to Stand: Min assist         General transfer comment: Assist to rise, stabilize, control descent. VCs safety, hand placement  Ambulation/Gait Ambulation/Gait assistance: Min assist Gait Distance (Feet): 40 Feet Assistive device: 4-wheeled walker Gait Pattern/deviations: Step-through pattern;Decreased stride length     General Gait Details: Unsteady. Pt fatigues quickly/easily. Slow gait speed. Assist to stabilize throughout distance. Cues for safety.   Stairs            Wheelchair Mobility    Modified Rankin (Stroke Patients Only)       Balance Overall balance assessment: History of Falls;Needs assistance         Standing balance support: Bilateral upper extremity supported Standing balance-Leahy Scale: Poor                                Pertinent Vitals/Pain Pain Assessment: No/denies pain    Home Living Family/patient expects to be discharged to:: Private residence Living Arrangements: Children Available Help at Discharge: Family;Available 24 hours/day Type of Home: House Home Access: Stairs to enter Entrance Stairs-Rails: None Entrance Stairs-Number of Steps: 1 Home Layout: One level Home Equipment: Walker - 2 wheels;Wheelchair - [employer] - 4 wheels Additional Comments: pt is on RW very short trips with family at home and wc for community mobility    Prior Function Level of Independence: Needs assistance   Gait / Transfers Assistance Needed: RW supervision/assist with family  ADL's / Homemaking Assistance Needed: family handles cooking and cleaning and helps with bathing, dressing        Hand Dominance   Dominant Hand: Right    Extremity/Trunk Assessment   Upper Extremity Assessment Upper Extremity Assessment: Generalized weakness    Lower Extremity Assessment Lower Extremity Assessment: Generalized weakness    Cervical / Trunk Assessment Cervical / Trunk Assessment: Kyphotic  Communication   Communication: Prefers language other than English  Cognition Arousal/Alertness: Awake/alert Behavior During Therapy: WFL for tasks assessed/performed Overall Cognitive Status: History of cognitive impairments - at baseline                                        General Comments      Exercises General Exercises - Lower Extremity Long  Arc Quad: AROM;5 reps;Seated   Assessment/Plan    PT Assessment Patient needs continued PT services  PT Problem List Decreased balance;Decreased mobility;Decreased strength;Decreased activity tolerance;Decreased cognition;Decreased knowledge of use of DME       PT Treatment Interventions DME instruction;Gait training;Functional mobility training;Therapeutic activities;Balance training;Patient/family education;Therapeutic exercise    PT  Goals (Current goals can be found in the Care Plan section)  Acute Rehab PT Goals Patient Stated Goal: per daughter, home with HHPT to get stronger PT Goal Formulation: With patient Time For Goal Achievement: 04/16/18 Potential to Achieve Goals: Good    Frequency Min 3X/week   Barriers to discharge        Co-evaluation               AM-PAC PT "6 Clicks" Mobility  Outcome Measure Help needed turning from your back to your side while in a flat bed without using bedrails?: A Little Help needed moving from lying on your back to sitting on the side of a flat bed without using bedrails?: A Little Help needed moving to and from a bed to a chair (including a wheelchair)?: A Little Help needed standing up from a chair using your arms (e.g., wheelchair or bedside chair)?: A Little Help needed to walk in hospital room?: A Little Help needed climbing 3-5 steps with a railing? : A Little 6 Click Score: 18    End of Session Equipment Utilized During Treatment: Gait belt Activity Tolerance: Patient limited by fatigue Patient left: in chair;with call bell/phone within reach;with family/visitor present   PT Visit Diagnosis: Muscle weakness (generalized) (M62.81);Difficulty in walking, not elsewhere classified (R26.2);Unsteadiness on feet (R26.81)    Time: 1610-9604 PT Time Calculation (min) (ACUTE ONLY): 11 min   Charges:   PT Evaluation $PT Eval Moderate Complexity: [place], PT Acute Rehabilitation Services Pager: [phone] Office: [phone]

## 2018-04-09 NOTE — Progress Notes (Signed)
PROGRESS NOTE    Jordan [name]  WRU:[id number] DOB: 08-May-1919 DOA: 04/05/2018 PCP: McDiarmid, Blane Ohara, MD      Brief Narrative:  Jordan [name] is a 84 y.o. F with dementia, home-dwelling, HTN, dCHF, Afib with pacer, on Eliquis, and hx bronchiectasis who presents with epigastric pain.  Found in ER to have lipase >800, LFTs 200s, CT with pancreatitis and gallstones up to 3cm and dilated CBD, no cholecystitis.  GI and Gen Surg consulted.       Assessment & Plan:  Acute gallstone pancreatitis Admitted and made NPO, IVF started.  MRCP deferred given pacer.  Evaluated by GI and Gen Surg, who recommended that if symptoms/labs normalize, the most prudent course would be to avoid surgery, as risk of surgery outweighs risk of recurrent pancreatitis given her age, comorbidities, dementia.    Lipase, AST, ALT all now normalized.  Attempted soft diet yesterday morning and vomited.  No more PO intake yesterday afternoon. -Continue clears, bowel rest -If tolerating clears today, advance to low residue diet tomorrow   Pleural effusion, small, left Rales resolved.  CXR with rounded opacity in left base yesterday, follow up CT chest with contrast personally reviewed, showed a small effusion no pneumonia.  In abscence of fever, leukocytosis, I have to believe this is inflammatory from adjacent pancreatitis, as well as aggressive fluid resuscitation. No hypoxia, no dyspnea, no chest pain.    Hypertension Chronic diastolic CHF BP improved yestereay with Lasix.  Still some mild swelling today, but improved from yesteday.   -Continue metoprolol -PO furosemide again once   Atrial fibrillation, paroxysmal Pacemaker in place Chads 2 vascular least 5.  On Eliquis at home.  Heparin has been stopped. -Continue Eliquis -Continue metoprolol  Gout -Continue allopurinol  Anxiety Dementia -Continue duloxetine, trazodone  Hypothyroidism -Continue levothyroxine  Other medications -Continue pantoprazole,  fesoterodine  Acute kidney injury: Creatinine 1.5 at admission, resolved to 0.9 with fluids.  Anemia of chronic diease Hgb stable relative to baseline, ~10 g/dL  Asymptomatic bacteriuria           MDM and disposition: Below labs and imaging reports reviewed and summarized above.  Medication management as above.  The patient was admitted with gallstone pancreatitis.  Thankfully, her bilirubin, LFTs, and lipase have normalized.  Attempts to advance her diet so far have failed.  Continue bowel rest, clear liquids, if tolerating tomorrow, advance to low residue diet and discharge  With regard to long-term plan, she is a high-risk surgical candidate, and her surgical risks are felt to outweigh her risk of future pancreatitis.            DVT prophylaxis: N/A on ELiquis Code Status: FULL Family Communication: Daughter at the bedside    Consultants:   GI  Gen Surg  Procedures:   CT abdomen  CT chest  Antimicrobials:   None    Subjective: Still some low back pain, no more vomiting yesterday evening, still little hand swelling, no fever, abdominal pain, epigastric pain, confusion, chest pain, dyspnea.  Objective: Vitals:   04/08/18 2041 04/09/18 0058 04/09/18 0517 04/09/18 1016  BP: (!) 187/77 (!) 160/59 (!) 145/57 (!) 155/66  Pulse: 82  85 90  Resp: 20  18   Temp: 98.1 F (36.7 C) 98.4 F (36.9 C) 98.3 F (36.8 C)   TempSrc: Oral Oral Oral   SpO2: 95%  94%   Height:        Intake/Output Summary (Last 24 hours) at 04/09/2018 1353 Last data filed at  04/09/2018 0927 Gross per 24 hour  Intake 240 ml  Output -  Net 240 ml   There were no vitals filed for this visit.  Examination: General appearance: Frail elderly female, lying in bed, appears tired. HEENT: Anicteric, conjunctival pink, lids and lashes normal.  No nasal deformity, discharge, or epistaxis.  Oropharynx moist, no oral lesions.  Hearing diminished. Skin: Skin warm and dry, no  jaundice, no suspicious rashes or lesions. Cardiac: Regular rate and rhythm, no murmurs, JVP normal, no lower extremity edema. Respiratory: Normal respiratory effort, no rales, no wheezing. Abdomen: Soft without tenderness to palpation or guarding, no distention or hepatosplenomegaly. MSK: No effusions or deformities of the large joints of the upper lower extremities bilaterally.  Diffuse loss of subcutaneous muscle mass and fat. Neuro: Awake and alert, extraocular movements intact, moves all extremities with 5-/5 strength, normal coordination, speech fluent in Spanish.    Psych: Sensorium intact and responding to questions, attention decreased, affect blunted.    Data Reviewed: I have personally reviewed following labs and imaging studies:  CBC: Recent Labs  Lab 04/05/18 1624 04/06/18 0608 04/06/18 1209 04/07/18 0618 04/09/18 0616  WBC 13.9* 15.2* 13.2* 8.4 5.7  NEUTROABS 12.1*  --  10.7*  --   --   HGB 11.3* 10.2* 10.3* 9.3* 10.9*  HCT 35.2* 32.7* 33.3* 30.0* 34.6*  MCV 105.4* 103.5* 107.8* 106.0* 101.2*  PLT 149* 122* 102* 103* 580*   Basic Metabolic Panel: Recent Labs  Lab 04/05/18 1624 04/06/18 0608 04/07/18 0618 04/08/18 0630 04/09/18 0616  NA 137 137 140 137 134*  K 4.6 4.6 4.1 3.9 4.3  CL 99 102 106 103 98  CO2 26 26 28 28 26   GLUCOSE 110* 119* 94 68* 61*  BUN 25* 28* 26* 16 18  CREATININE 1.50* 1.47* 1.26* 0.86 1.14*  CALCIUM 8.8* 7.8* 8.2* 8.5* 8.5*   GFR: CrCl cannot be calculated (Unknown ideal weight.). Liver Function Tests: Recent Labs  Lab 04/05/18 1624 04/06/18 0608 04/07/18 0618 04/08/18 0630 04/09/18 0616  AST 231* 119* 59* 39 28  ALT 135* 95* 66* 53* 42  ALKPHOS 242* 176* 154* 168* 164*  BILITOT 4.0* 1.9* 1.0 0.9 1.2  PROT 5.7* 4.9* 4.6* 4.9* 5.0*  ALBUMIN 3.0* 2.6* 2.6* 2.6* 2.8*   Recent Labs  Lab 04/05/18 1624 04/07/18 0618 04/09/18 0616  LIPASE 876* 70* 46   No results for input(s): AMMONIA in the last 168 hours. Coagulation  Profile: Recent Labs  Lab 04/05/18 1624  INR 0.99   Cardiac Enzymes: No results for input(s): CKTOTAL, CKMB, CKMBINDEX, TROPONINI in the last 168 hours. BNP (last 3 results) No results for input(s): PROBNP in the last 8760 hours. HbA1C: No results for input(s): HGBA1C in the last 72 hours. CBG: Recent Labs  Lab 04/08/18 1202 04/08/18 1746  GLUCAP 95 84   Lipid Profile: No results for input(s): CHOL, HDL, LDLCALC, TRIG, CHOLHDL, LDLDIRECT in the last 72 hours. Thyroid Function Tests: No results for input(s): TSH, T4TOTAL, FREET4, T3FREE, THYROIDAB in the last 72 hours. Anemia Panel: No results for input(s): VITAMINB12, FOLATE, FERRITIN, TIBC, IRON, RETICCTPCT in the last 72 hours. Urine analysis:    Component Value Date/Time   COLORURINE AMBER (A) 04/05/2018 1531   APPEARANCEUR CLEAR 04/05/2018 1531   LABSPEC 1.014 04/05/2018 1531   PHURINE 5.0 04/05/2018 1531   GLUCOSEU NEGATIVE 04/05/2018 1531   HGBUR SMALL (A) 04/05/2018 1531   HGBUR small 12/17/2007 1124   BILIRUBINUR NEGATIVE 04/05/2018 1531   BILIRUBINUR negative 02/08/2018  1640   BILIRUBINUR NEG 05/26/2016 1700   KETONESUR NEGATIVE 04/05/2018 1531   PROTEINUR 30 (A) 04/05/2018 1531   UROBILINOGEN 0.2 02/08/2018 1640   UROBILINOGEN 0.2 08/29/2013 2142   NITRITE NEGATIVE 04/05/2018 1531   LEUKOCYTESUR NEGATIVE 04/05/2018 1531   Sepsis Labs: @LABRCNTIP (procalcitonin:4,lacticacidven:4)  ) Recent Results (from the past 240 hour(s))  Urine culture     Status: Abnormal   Collection Time: 04/05/18  3:41 PM  Result Value Ref Range Status   Specimen Description   Final    URINE, CLEAN CATCH Performed at [facility], [place] [address] [place], [place] [address]    Special Requests   Final    NONE Performed at [facility], [place] [address] [place], [place] [address]    Culture 10,000 COLONIES/mL ESCHERICHIA COLI (A)  Final   Report Status 04/08/2018 FINAL  Final   Organism  ID, Bacteria ESCHERICHIA COLI (A)  Final      Susceptibility   Escherichia coli - MIC*    AMPICILLIN <=2 SENSITIVE Sensitive     CEFAZOLIN <=4 SENSITIVE Sensitive     CEFTRIAXONE <=1 SENSITIVE Sensitive     CIPROFLOXACIN <=0.25 SENSITIVE Sensitive     GENTAMICIN <=1 SENSITIVE Sensitive     IMIPENEM <=0.25 SENSITIVE Sensitive     NITROFURANTOIN <=16 SENSITIVE Sensitive     TRIMETH/SULFA <=20 SENSITIVE Sensitive     AMPICILLIN/SULBACTAM <=2 SENSITIVE Sensitive     PIP/TAZO <=4 SENSITIVE Sensitive     Extended ESBL NEGATIVE Sensitive     * 10,000 COLONIES/mL ESCHERICHIA COLI  Culture, blood (routine x 2)     Status: None (Preliminary result)   Collection Time: 04/06/18 12:09 PM  Result Value Ref Range Status   Specimen Description   Final    BLOOD RIGHT ARM Performed at [place] [address], [place], [place] [address]    Special Requests   Final    BOTTLES DRAWN AEROBIC ONLY Blood Culture results may not be optimal due to an inadequate volume of blood received in culture bottles Performed at [place] [address], [place], [place] [address]    Culture   Final    NO GROWTH 2 DAYS Performed at [place] [address] [place], [place] [address]    Report Status PENDING  Incomplete  Culture, blood (routine x 2)     Status: None (Preliminary result)   Collection Time: 04/06/18 12:09 PM  Result Value Ref Range Status   Specimen Description   Final    BLOOD RIGHT HAND Performed at [facility] [address], [place], [place] [address]    Special Requests   Final    BOTTLES DRAWN AEROBIC AND ANAEROBIC Blood Culture results may not be optimal due to an inadequate volume of blood received in culture bottles Performed at [facility] [address], [place], [place] [address]    Culture   Final    NO GROWTH 2 DAYS Performed at [facility] [address], [place], [place]  [address]    Report Status PENDING  Incomplete         Radiology Studies: Ct Chest W Contrast  Result Date: 04/08/2018 CLINICAL DATA:  Abnormal chest radiograph EXAM: CT CHEST WITH CONTRAST TECHNIQUE: Multidetector CT imaging of the chest was performed during intravenous contrast administration. CONTRAST:  55mL OMNIPAQUE IOHEXOL 300 MG/ML  SOLN COMPARISON:  Chest radiograph dated 04/08/2018. Partial comparison to  CT abdomen/pelvis dated 04/05/2018. CT chest dated 05/14/2016. FINDINGS: Cardiovascular: Heart is normal in size.  No pericardial effusion. No evidence of thoracic aortic aneurysm. Atherosclerotic calcifications of the aortic arch. Three vessel coronary atherosclerosis. Left subclavian pacemaker. Mediastinum/Nodes: No suspicious mediastinal lymphadenopathy. Visualized thyroid is unremarkable. Lungs/Pleura: Biapical pleural-parenchymal scarring. No focal consolidation. No suspicious pulmonary nodules. Small left pleural effusion, new from recent CT abdomen/pelvis, likely accounting for the radiographic abnormality. Associated mild left basilar atelectasis. Trace right pleural effusion. Upper Abdomen: Visualized upper abdomen is grossly unremarkable. Musculoskeletal: Stable mild compression fracture at T10. Degenerative changes of the visualized thoracolumbar spine. IMPRESSION: Small left pleural effusion, new from recent CT abdomen/pelvis, likely accounting for the radiographic abnormality. Associated mild left basilar atelectasis. Trace right pleural effusion. Aortic Atherosclerosis (ICD10-I70.0). Electronically Signed   By: Julian Hy M.D.   On: 04/08/2018 20:20   Dg Chest Port 1 View  Result Date: 04/08/2018 CLINICAL DATA:  Abnormal breath sounds. EXAM: PORTABLE CHEST 1 VIEW COMPARISON:  10/02/2017 FINDINGS: The permanent pacer wires are stable. Mild cardiac enlargement is stable. There is tortuosity and calcification of the thoracic aorta. Slightly prominent pulmonary hila are stable.  Stable emphysematous changes and pulmonary scarring with biapical pleural and parenchymal density. Rounded density at the left lung base is new since prior chest films and could be rounded atelectasis, infiltrate, loculated pleural fluid collection or mass. A small left pleural effusion is suspected. The bony structures are intact. A right reversed total shoulder arthroplasty is noted. IMPRESSION: Chronic bronchitic and emphysematous lung changes. Rounded masslike density at the left lung base as detailed above. Recommend chest CT with contrast (if possible) for further evaluation. Electronically Signed   By: Marijo Sanes M.D.   On: 04/08/2018 13:20        Scheduled Meds: . allopurinol  100 mg Oral Daily  . apixaban  2.5 mg Oral BID  . DULoxetine  30 mg Oral Daily  . fesoterodine  4 mg Oral Daily  . levothyroxine  100 mcg Oral Daily  . metoprolol succinate  25 mg Oral Daily  . pantoprazole  40 mg Oral Daily  . traZODone  50 mg Oral QHS   Continuous Infusions:   LOS: 4 days    Time spent: 25 minutes    Edwin Dada, MD [facility] 04/09/2018, 1:53 PM     Please page through AMION:  [email or web address] Password [email or web address] If 7PM-7AM, please contact night-coverage

## 2018-04-09 NOTE — Progress Notes (Signed)
Patient ID: Jordan [name], female   DOB: 04-Aug-1919, 84 y.o.   MRN: [id number]       Subjective: Patient's daughter interprets today at the bedside.  Patient states she feels ok today.  Denies abdominal pain.  Denies N/V today.  Apparently threw up some yesterday after pudding.  Objective: Vital signs in last 24 hours: Temp:  [98.1 F (36.7 C)-98.4 F (36.9 C)] 98.3 F (36.8 C) (12/09 0517) Pulse Rate:  [81-88] 85 (12/09 0517) Resp:  [16-20] 18 (12/09 0517) BP: (145-191)/(57-77) 145/57 (12/09 0517) SpO2:  [94 %-96 %] 94 % (12/09 0517) Last BM Date: 04/07/18  Intake/Output from previous day: 12/08 0701 - 12/09 0700 In: 120 [P.O.:120] Out: -  Intake/Output this shift: No intake/output data recorded.  PE: Heart: regular, with pacemaker Abd: soft, NT, ND, +BS  Lab Results:  Recent Labs    04/07/18 0618 04/09/18 0616  WBC 8.4 5.7  HGB 9.3* 10.9*  HCT 30.0* 34.6*  PLT 103* 141*   BMET Recent Labs    04/08/18 0630 04/09/18 0616  NA 137 134*  K 3.9 4.3  CL 103 98  CO2 28 26  GLUCOSE 68* 61*  BUN 16 18  CREATININE 0.86 1.14*  CALCIUM 8.5* 8.5*   PT/INR No results for input(s): LABPROT, INR in the last 72 hours. CMP     Component Value Date/Time   NA 134 (L) 04/09/2018 0616   NA 135 10/02/2017 1537   K 4.3 04/09/2018 0616   CL 98 04/09/2018 0616   CO2 26 04/09/2018 0616   GLUCOSE 61 (L) 04/09/2018 0616   BUN 18 04/09/2018 0616   BUN 26 10/02/2017 1537   CREATININE 1.14 (H) 04/09/2018 0616   CREATININE 1.26 (H) 05/26/2016 1645   CALCIUM 8.5 (L) 04/09/2018 0616   PROT 5.0 (L) 04/09/2018 0616   PROT 5.8 (L) 10/02/2017 1537   ALBUMIN 2.8 (L) 04/09/2018 0616   ALBUMIN 3.4 10/02/2017 1537   AST 28 04/09/2018 0616   ALT 42 04/09/2018 0616   ALKPHOS 164 (H) 04/09/2018 0616   BILITOT 1.2 04/09/2018 0616   BILITOT 0.3 10/02/2017 1537   GFRNONAA 41 (L) 04/09/2018 0616   GFRNONAA 37 (L) 05/26/2016 1645   GFRAA 47 (L) 04/09/2018 0616   GFRAA 42 (L) 05/26/2016  1645   Lipase     Component Value Date/Time   LIPASE 46 04/09/2018 0616       Studies/Results: Ct Chest W Contrast  Result Date: 04/08/2018 CLINICAL DATA:  Abnormal chest radiograph EXAM: CT CHEST WITH CONTRAST TECHNIQUE: Multidetector CT imaging of the chest was performed during intravenous contrast administration. CONTRAST:  32mL OMNIPAQUE IOHEXOL 300 MG/ML  SOLN COMPARISON:  Chest radiograph dated 04/08/2018. Partial comparison to CT abdomen/pelvis dated 04/05/2018. CT chest dated 05/14/2016. FINDINGS: Cardiovascular: Heart is normal in size.  No pericardial effusion. No evidence of thoracic aortic aneurysm. Atherosclerotic calcifications of the aortic arch. Three vessel coronary atherosclerosis. Left subclavian pacemaker. Mediastinum/Nodes: No suspicious mediastinal lymphadenopathy. Visualized thyroid is unremarkable. Lungs/Pleura: Biapical pleural-parenchymal scarring. No focal consolidation. No suspicious pulmonary nodules. Small left pleural effusion, new from recent CT abdomen/pelvis, likely accounting for the radiographic abnormality. Associated mild left basilar atelectasis. Trace right pleural effusion. Upper Abdomen: Visualized upper abdomen is grossly unremarkable. Musculoskeletal: Stable mild compression fracture at T10. Degenerative changes of the visualized thoracolumbar spine. IMPRESSION: Small left pleural effusion, new from recent CT abdomen/pelvis, likely accounting for the radiographic abnormality. Associated mild left basilar atelectasis. Trace right pleural effusion. Aortic Atherosclerosis (ICD10-I70.0). Electronically  Signed   By: Julian Hy M.D.   On: 04/08/2018 20:20   Dg Chest Port 1 View  Result Date: 04/08/2018 CLINICAL DATA:  Abnormal breath sounds. EXAM: PORTABLE CHEST 1 VIEW COMPARISON:  10/02/2017 FINDINGS: The permanent pacer wires are stable. Mild cardiac enlargement is stable. There is tortuosity and calcification of the thoracic aorta. Slightly prominent  pulmonary hila are stable. Stable emphysematous changes and pulmonary scarring with biapical pleural and parenchymal density. Rounded density at the left lung base is new since prior chest films and could be rounded atelectasis, infiltrate, loculated pleural fluid collection or mass. A small left pleural effusion is suspected. The bony structures are intact. A right reversed total shoulder arthroplasty is noted. IMPRESSION: Chronic bronchitic and emphysematous lung changes. Rounded masslike density at the left lung base as detailed above. Recommend chest CT with contrast (if possible) for further evaluation. Electronically Signed   By: Marijo Sanes M.D.   On: 04/08/2018 13:20    Anti-infectives: Anti-infectives (From admission, onward)   Start     Dose/Rate Route Frequency Ordered Stop   04/05/18 1845  piperacillin-tazobactam (ZOSYN) IVPB 3.375 g     3.375 g 100 mL/hr over 30 Minutes Intravenous  Once 04/05/18 1843 04/05/18 1931       Assessment/Plan Multiple medical problems  Gallstone pancreatitis -clear liquids today and see how she tolerates.  If she does well today, then low fat tomorrow. -labs are normal except alkphos still slightly up at 164. -still no plans for surgical intervention at this time.   FEN - CLD/IVFs VTE - heparin gtt, eliquis on hold ID - none   LOS: 4 days    Henreitta Cea , [facility] Surgery 04/09/2018, 9:24 AM Pager: [phone]

## 2018-04-09 NOTE — Care Management Important Message (Signed)
Important Message  Patient Details  Name: YOHANNA TOW MRN: [id number] Date of Birth: 12/21/1919   Medicare Important Message Given:  Yes    Kerin Salen 04/09/2018, 12:11 [place] Message  Patient Details  Name: RODA LAUTURE MRN: [id number] Date of Birth: [birth year]   Medicare Important Message Given:  Yes    Kerin Salen 04/09/2018, 12:11 PM

## 2018-04-10 ENCOUNTER — Telehealth: Payer: Self-pay | Admitting: Family Medicine

## 2018-04-10 DIAGNOSIS — M810 Age-related osteoporosis without current pathological fracture: Secondary | ICD-10-CM

## 2018-04-10 MED ORDER — METOPROLOL SUCCINATE ER 25 MG PO TB24: 25.0000 mg | ORAL_TABLET | Freq: Every day | ORAL | 0 refills | Status: DC

## 2018-04-10 MED ORDER — ONDANSETRON HCL 4 MG PO TABS: 4.0000 mg | ORAL_TABLET | Freq: Every day | ORAL | 1 refills | Status: AC | PRN

## 2018-04-10 MED ORDER — FUROSEMIDE 20 MG PO TABS: 20.0000 mg | ORAL_TABLET | Freq: Every day | ORAL | Status: DC | PRN

## 2018-04-10 NOTE — Telephone Encounter (Signed)
Will forward to MD. Jordan [name],CMA  

## 2018-04-10 NOTE — Discharge Instructions (Signed)
Dieta con bajo contenido de grasas para la pancreatitis o los trastornos de la vescula (Low-Fat Diet for Pancreatitis or Gallbladder Conditions) Una dieta con bajo contenido de grasas puede ser til si usted tiene pancreatitis o trastornos de la vescula. En estos trastornos, el pncreas y la vescula tienen dificultades para digerir las grasas. Un plan de alimentacin saludable con menos grasas ayudar a que el pncreas y la vescula descansen, y reducir los sntomas. QU DEBO SABER ACERCA DE ESTA [place]?  Consuma una dieta con bajo contenido de [place]. ? Reduzca el consumo de grasas a menos del 20% al 30% del total de caloras diarias. Esto representa menos de 50a 60gramos de grasas por da. ? Recuerde que la dieta debe incluir algo de grasa. Consulte al nutricionista cul debe ser su meta diaria. ? Elija alimentos saludables sin grasas o con bajo contenido de grasas. Busque las palabras sin grasa, bajo en grasas o bajo contenido de [place]. ? [employer], mire la etiqueta y elija alimentos con menos de 3gramos de grasa por porcin. Coma solo una porcin.  Evite el alcohol.  No fumar. Si necesita ayuda para dejar de fumar, hable con el mdico.  Haga comidas pequeas y frecuentes en lugar de 3 comidas abundantes y pesadas.  QU ALIMENTOS PUEDO COMER? Cereales Incluya granos y almidones saludables, como papas, pan integral, cereales ricos en fibras y Lorre Nick integral. Elija opciones de cereales integrales siempre que sea posible. En los adultos, los cereales integrales deben representar del 45% al 65% de las caloras diarias. Lambert Mody y verduras Coma muchas frutas y verduras. Las frutas y verduras frescas aportan fibra a la dieta. Carnes y otras fuentes de protenas Coma carnes [place], como pollo y 64. Quite la grasa de la carne antes de cocinarla. Los [place], el pescado y los frijoles son otras fuentes de protenas. En los adultos, estos alimentos deben representar entre el 10% y  el 35% de las caloras diarias. Gideon y otros productos lcteos con bajo contenido de [place]. Los lcteos aportan grasas y protenas, adems de calcio. Grasas y [employer] alimentos con alto contenido de grasas, como las comidas fritas, los [place], los productos horneados y las bebidas azucaradas. [place] condimentos y las salsas cremosas, como la [place], [place] aportar grasa adicional. Piense si necesita usarlos o no, o use pequeas cantidades u opciones con bajo contenido de grasas. QU ALIMENTOS NO ESTN RECOMENDADOS?  Los alimentos con alto contenido de [place], por ejemplo: ? Alimentos horneados. ? Helados. ? Tostadas francesas. ? Panecillos dulces. ? Pizza. ? Pan de queso. ? Alimentos rebozados o cubiertos con [place], salsas cremosas o queso. ? Comidas fritas. ? Postres y bebidas azucaradas.  Alimentos que causan gases o meteorismo  Esta informacin no tiene Marine scientist el consejo del mdico. Asegrese de hacerle al mdico cualquier pregunta que tenga. Document Released: 04/23/2013 Document Revised: 04/23/2013 Document Reviewed: 04/01/2013 Elsevier Interactive Patient Education  2017 [employer].

## 2018-04-10 NOTE — Care Management Note (Addendum)
Case Management Note  Patient Details  Name: Jordan [name] MRN: [id number] Date of Birth: [birth year]  Subjective/Objective:                  Discharge planning  Action/Plan: Spoke with family and gov. List of hhc agencies given to daughter/choses [facility]. [facility] notified List of agencies placed in shadow chart Expected Discharge Date:  04/10/18               Expected Discharge Plan:  [place]  In-House Referral:     Discharge planning Services  CM Consult  Post Acute Care Choice:  Home Health Choice offered to:  Adult Children  DME Arranged:    DME Agency:     HH Arranged:  RN, PT, OT [place] Agency:  [place]  Status of Service:  Completed, signed off  If discussed at [place] of Stay Meetings, dates discussed:    Additional Comments:  Leeroy Cha, RN 04/10/2018, 4:12 PM

## 2018-04-10 NOTE — Discharge Summary (Signed)
Physician Discharge Summary  Jordan [name] YOV:[id number] DOB: Oct 19, 1919 DOA: 04/05/2018  PCP: McDiarmid, Blane Ohara, MD  Admit date: 04/05/2018 Discharge date: 04/10/2018  Admitted From: Home  Disposition:  Home with home health   Recommendations for Outpatient Follow-up:  1. Follow up with PCP in 1 week 2. Please obtain CMP and CBC in 1 week     Home Health: Yes  Equipment/Devices: None  Discharge Condition: Fair  CODE STATUS: FULL Diet recommendation: Kathie Dike  Brief/Interim Summary: Jordan [name] is a 84 y.o. F with dementia, home-dwelling, HTN, dCHF, Afib with pacer, on Eliquis, and hx bronchiectasis who presents with epigastric pain.  Found in ER to have lipase >800, LFTs 200s, CT with pancreatitis and gallstones up to 3cm and dilated CBD, no cholecystitis.  GI and Gen Surg consulted.     PRINCIPAL HOSPITAL DIAGNOSIS: Acute gallstone pancreatitis    Discharge Diagnoses:   Acute gallstone pancreatitis Admitted and made NPO, IVF started.  MRCP deferred given pacer.  Evaluated by GI and Gen Surg, who recommended that if symptoms/labs normalize, the most prudent course would be to avoid surgery, as risk of surgery outweighs risk of recurrent pancreatitis given her age, comorbidities, dementia.    Lipase, AST, ALT quickly normalized and patient's abdominal pain resolved.  She had a slow recovery with vomiting after initial attempts to advance diet, but on day of discharge, patient tolerated soft bland diet twice without symptoms.    Education given on low residue diet or BRAT diet, plans in place for close PCP follow up.    Pleural effusion, small, left CXR obtained due to rales on exam on HD3.  Showed rounded opacity in left base; follow up CT chest with contrast showed a small effusion no pneumonia.  In abscence of fever, leukocytosis, this was felt to be inflammatory from adjacent pancreatitis, as well as aggressive fluid resuscitation. No hypoxia, no dyspnea, no chest  pain, fever or leukocytosis to suggest pneumonia.    Hypertension Chronic diastolic CHF BP at times elevated in 180s.  Metoprolol increased.  BP improved with home Lasix.   BP at discharge 741 systolic, patient will follow up with PCP.  Atrial fibrillation, paroxysmal Pacemaker in place Chads 2 vascular least 5.  Eliquis and metoprolol continued.  Gout  Anxiety Dementia  Hypothyroidism  Acute kidney injury Creatinine 1.5 at admission, resolved to 0.9 with fluids.  Anemia of chronic diease Hgb stable relative to baseline, ~10 g/dL  Asymptomatic bacteriuria              Discharge Instructions  Discharge Instructions    Discharge instructions   Complete by:  As directed    From Dr. Loleta Books: You were admitted with pancreatitis from a gallstone that got stuck in the common bile duct. This stone passed through on its own, and thankfully did not require any treatment.  Please call Dr. McDiarmid's office today or tomorrow to schedule a follow up appointment next week.  If needed, for nausea, take ondansetron 4 mg by mouth for nausea  Resume all your other prior home medications with two adjustments: Increase your metoprolol dose to 25 mg twice daily (and discuss with Dr. McDiarmid if you need to add back losartan) And if you do indeed take colchicine twice daily every day, hold off on this for a week, restart it next week    With regard to diet: Eat bland foods, from a "BRAT" diet: Any clear liquids or broths, simple starches (crackers, potatoes, noodles/pasta, rice).  Avoid  dairy, gravy, butter, fatty foods, or fried foods.  I suspect within a week or 10 days, your mother will be ready to advance to a more normal diet (when she can demonstrate to you that she is eating a full meal of BRAT foods)   Best of luck!   Increase activity slowly   Complete by:  As directed      Allergies as of 04/10/2018      Reactions   Chlorthalidone Other (See  Comments)   Hyponatremia   Honey Bee Treatment [bee Venom] Swelling, Rash   Verapamil Other (See Comments)   Bradycardia - HR  35-45 on 40mg  TID   Adhesive [tape] Rash, Other (See Comments)   Skin irritation   Amlodipine Other (See Comments)   Peripheral edema with 2.5mg  daily dose      Medication List    STOP taking these medications   oxyCODONE 5 MG immediate release tablet Commonly known as:  Oxy IR/ROXICODONE     TAKE these medications   acetaminophen 650 MG CR tablet Commonly known as:  TYLENOL Take 650 mg by mouth 2 (two) times daily as needed for pain.   allopurinol 100 MG tablet Commonly known as:  ZYLOPRIM take 1 tablet by mouth once daily   apixaban 2.5 MG Tabs tablet Commonly known as:  ELIQUIS Take 2.5 mg by mouth 2 (two) times daily.   beta carotene w/minerals tablet Take 1 tablet by mouth daily.   COLCRYS 0.6 MG tablet Generic drug:  colchicine TAKE 1 TABLET BY MOUTH TWICE DAILY   cycloSPORINE 0.05 % ophthalmic emulsion Commonly known as:  RESTASIS Place 1 drop into both eyes at bedtime.   diclofenac sodium 1 % Gel Commonly known as:  VOLTAREN Apply 2 g topically 4 (four) times daily. What changed:    when to take this  reasons to take this   DULoxetine 30 MG capsule Commonly known as:  CYMBALTA Take 30 mg by mouth daily.   esomeprazole 40 MG capsule Commonly known as:  NEXIUM Take 1 capsule (40 mg total) by mouth daily.   fesoterodine 4 MG Tb24 tablet Commonly known as:  TOVIAZ Take 1 tablet (4 mg total) by mouth daily.   furosemide 20 MG tablet Commonly known as:  LASIX Take 1 tablet (20 mg total) by mouth daily as needed for fluid.   gabapentin 100 MG capsule Commonly known as:  NEURONTIN Take 2 capsules (200 mg total) by mouth 2 (two) times daily.   levothyroxine 100 MCG tablet Commonly known as:  SYNTHROID, LEVOTHROID TAKE 1 TABLET BY MOUTH ONCE DAILY   Lidocaine 0.5 % Gel Apply 1 application topically daily as needed  (back pain).   metoprolol succinate 25 MG 24 hr tablet Commonly known as:  TOPROL-XL Take 1 tablet (25 mg total) by mouth daily. Start taking on:  04/11/2018 What changed:  how much to take   ondansetron 4 MG tablet Commonly known as:  ZOFRAN Take 1 tablet (4 mg total) by mouth daily as needed for nausea or vomiting.   polyethylene glycol packet Commonly known as:  MIRALAX / GLYCOLAX take 17 grams by mouth once daily What changed:  See the new instructions.   TRAVATAN Z 0.004 % Soln ophthalmic solution Generic drug:  Travoprost (BAK Free) Place 1 drop into both eyes at bedtime.   traZODone 50 MG tablet Commonly known as:  DESYREL Take 50 mg by mouth at bedtime.   vitamin B-12 1000 MCG tablet Commonly known as:  CYANOCOBALAMIN  Take 1,000 mcg by mouth daily.       Allergies  Allergen Reactions  . Chlorthalidone Other (See Comments)    Hyponatremia  . Honey Bee Treatment [Bee Venom] Swelling and Rash  . Verapamil Other (See Comments)    Bradycardia - HR  35-45 on 40mg  TID  . Adhesive [Tape] Rash and Other (See Comments)    Skin irritation  . Amlodipine Other (See Comments)    Peripheral edema with 2.5mg  daily dose    Consultations:  GI  General Surgery   Procedures/Studies: Ct Abdomen Pelvis Wo Contrast  Result Date: 04/05/2018 CLINICAL DATA:  Per patient's daughter pt woke up this am with upper abd pain and nausea Pain became diffuse today and still nauseated, has some back pain Originally ordered as a dissection but changed due to low GFR No oral Hx of hernia and hysterectomy No hx of CA EXAM: CT ABDOMEN AND PELVIS WITHOUT CONTRAST TECHNIQUE: Multidetector CT imaging of the abdomen and pelvis was performed following the standard protocol without IV contrast. COMPARISON:  CT of the abdomen and pelvis on 11/02/2015 FINDINGS: Lower chest: Scattered emphysematous changes are present. There is bronchiectasis in the LEFT and RIGHT LOWER lobes. Patient has cardiac lead  to the RIGHT ventricle. There is atherosclerotic calcification of the coronary arteries. Hepatobiliary: Liver attenuation is diffusely low, consistent with hepatic steatosis. No focal liver lesions are identified. The gallbladder is distended and contains a large gallstone which measures 2.4 x 3.3 centimeters. Pancreas: There is peripancreatic stranding which extends to the root of the mesentery and the anterior pararenal space. No focal fluid collections. No pseudocyst or abscess. Spleen: Normal in size without focal abnormality. Adrenals/Urinary Tract: Adrenals are normal in appearance. There is a benign cyst in the LOWER pole the RIGHT kidney. LEFT kidney is unremarkable. The ureters are normal in appearance. The bladder and visualized portion of the urethra are normal. Stomach/Bowel: The stomach and small bowel loops are normal in appearance. There are scattered colonic diverticula but no acute diverticulitis. Significant rectosigmoid stool. No evidence for acute appendicitis. Vascular/Lymphatic: There is dense atherosclerotic calcification of the abdominal aorta not associated with aneurysm. No retroperitoneal or mesenteric adenopathy. Reproductive: Hysterectomy.  No adnexal mass. Other: Numerous fat containing anterior abdominal wall hernias are identified below the level of the umbilicus no evidence for associated bowel obstruction. Musculoskeletal: Significant degenerative changes in the lumbar spine, associated with scoliosis, convex to the LEFT. Remote wedge compression fracture of L2 with approximately 50% loss of vertebral body height. Extensive facet hypertrophy in the LOWER lumbar levels. IMPRESSION: 1. Acute pancreatitis.  No evidence for abscess or pseudocyst. 2. Hepatic steatosis. 3. Distended gallbladder with large 3.3 centimeter stone. 4. Colonic diverticulosis. 5.  Aortic atherosclerosis.  (ICD10-I70.0) 6. Hysterectomy. 7. Numerous anterior abdominal wall fat containing hernias. 8. Remote L2  compression fracture. 9. RIGHT renal cyst. 10. Bilateral LOWER lobe bronchiectasis. 11. Coronary artery disease. Electronically Signed   By: Nolon Nations M.D.   On: 04/05/2018 18:43   Ct Chest W Contrast  Result Date: 04/08/2018 CLINICAL DATA:  Abnormal chest radiograph EXAM: CT CHEST WITH CONTRAST TECHNIQUE: Multidetector CT imaging of the chest was performed during intravenous contrast administration. CONTRAST:  22mL OMNIPAQUE IOHEXOL 300 MG/ML  SOLN COMPARISON:  Chest radiograph dated 04/08/2018. Partial comparison to CT abdomen/pelvis dated 04/05/2018. CT chest dated 05/14/2016. FINDINGS: Cardiovascular: Heart is normal in size.  No pericardial effusion. No evidence of thoracic aortic aneurysm. Atherosclerotic calcifications of the aortic arch. Three vessel coronary atherosclerosis. Left  subclavian pacemaker. Mediastinum/Nodes: No suspicious mediastinal lymphadenopathy. Visualized thyroid is unremarkable. Lungs/Pleura: Biapical pleural-parenchymal scarring. No focal consolidation. No suspicious pulmonary nodules. Small left pleural effusion, new from recent CT abdomen/pelvis, likely accounting for the radiographic abnormality. Associated mild left basilar atelectasis. Trace right pleural effusion. Upper Abdomen: Visualized upper abdomen is grossly unremarkable. Musculoskeletal: Stable mild compression fracture at T10. Degenerative changes of the visualized thoracolumbar spine. IMPRESSION: Small left pleural effusion, new from recent CT abdomen/pelvis, likely accounting for the radiographic abnormality. Associated mild left basilar atelectasis. Trace right pleural effusion. Aortic Atherosclerosis (ICD10-I70.0). Electronically Signed   By: Julian Hy M.D.   On: 04/08/2018 20:20   Dg Chest Port 1 View  Result Date: 04/08/2018 CLINICAL DATA:  Abnormal breath sounds. EXAM: PORTABLE CHEST 1 VIEW COMPARISON:  10/02/2017 FINDINGS: The permanent pacer wires are stable. Mild cardiac enlargement is  stable. There is tortuosity and calcification of the thoracic aorta. Slightly prominent pulmonary hila are stable. Stable emphysematous changes and pulmonary scarring with biapical pleural and parenchymal density. Rounded density at the left lung base is new since prior chest films and could be rounded atelectasis, infiltrate, loculated pleural fluid collection or mass. A small left pleural effusion is suspected. The bony structures are intact. A right reversed total shoulder arthroplasty is noted. IMPRESSION: Chronic bronchitic and emphysematous lung changes. Rounded masslike density at the left lung base as detailed above. Recommend chest CT with contrast (if possible) for further evaluation. Electronically Signed   By: Marijo Sanes M.D.   On: 04/08/2018 13:20       Subjective: No abdominal pain.  Persistent mild back pain, chronic.  No vomiting.  No nausea.  No confusion.  No dyspnea, cough.  Discharge Exam: Vitals:   04/10/18 0949 04/10/18 1349  BP: (!) 155/67 (!) 156/69  Pulse: 76 73  Resp:  20  Temp:  97.9 F (36.6 C)  SpO2: 97% 97%   Vitals:   04/09/18 2023 04/10/18 0551 04/10/18 0949 04/10/18 1349  BP: (!) 153/68 (!) 164/73 (!) 155/67 (!) 156/69  Pulse: 74 80 76 73  Resp: 20 20  20   Temp: 99.5 F (37.5 C) 98.4 F (36.9 C)  97.9 F (36.6 C)  TempSrc: Oral Oral    SpO2: 98% 96% 97% 97%  Height:        General: Pt is sitting up in bed, interactive, alert, no acute distress Cardiovascular: RRR, nl S1-S2, no murmurs appreciated.   No LE edema.   Respiratory: Normal respiratory rate and rhythm.  Atelectasis at bilateral bases. Abdominal: Abdomen soft and non-tender.  No distension or HSM.   Neuro/Psych: Strength symmetric in upper and lower extremities.  Judgment and insight appear normal.   The results of significant diagnostics from this hospitalization (including imaging, microbiology, ancillary and laboratory) are listed below for reference.     Microbiology: Recent  Results (from the past 240 hour(s))  Urine culture     Status: Abnormal   Collection Time: 04/05/18  3:41 PM  Result Value Ref Range Status   Specimen Description   Final    URINE, CLEAN CATCH Performed at [facility] [address], [place], [place] [address]    Special Requests   Final    NONE Performed at [facility], [place] [address] [place], [place] [address]    Culture 10,000 COLONIES/mL ESCHERICHIA COLI (A)  Final   Report Status 04/08/2018 FINAL  Final   Organism ID, Bacteria ESCHERICHIA COLI (A)  Final  Susceptibility   Escherichia coli - MIC*    AMPICILLIN <=2 SENSITIVE Sensitive     CEFAZOLIN <=4 SENSITIVE Sensitive     CEFTRIAXONE <=1 SENSITIVE Sensitive     CIPROFLOXACIN <=0.25 SENSITIVE Sensitive     GENTAMICIN <=1 SENSITIVE Sensitive     IMIPENEM <=0.25 SENSITIVE Sensitive     NITROFURANTOIN <=16 SENSITIVE Sensitive     TRIMETH/SULFA <=20 SENSITIVE Sensitive     AMPICILLIN/SULBACTAM <=2 SENSITIVE Sensitive     PIP/TAZO <=4 SENSITIVE Sensitive     Extended ESBL NEGATIVE Sensitive     * 10,000 COLONIES/mL ESCHERICHIA COLI  Culture, blood (routine x 2)     Status: None (Preliminary result)   Collection Time: 04/06/18 12:09 PM  Result Value Ref Range Status   Specimen Description   Final    BLOOD RIGHT ARM Performed at [facility] [address], [place], [place] [address]    Special Requests   Final    BOTTLES DRAWN AEROBIC ONLY Blood Culture results may not be optimal due to an inadequate volume of blood received in culture bottles Performed at [facility] [address] [place], [place] [address]    Culture   Final    NO GROWTH 4 DAYS Performed at [facility], [place] [address] [place], [place] [address]    Report Status PENDING  Incomplete  Culture, blood (routine x 2)     Status: None (Preliminary result)   Collection Time: 04/06/18 12:09 PM  Result Value Ref  Range Status   Specimen Description   Final    BLOOD RIGHT HAND Performed at [facility] [address], [place], [place] [address]    Special Requests   Final    BOTTLES DRAWN AEROBIC AND ANAEROBIC Blood Culture results may not be optimal due to an inadequate volume of blood received in culture bottles Performed at [facility] [address] [place], [place] [address]    Culture   Final    NO GROWTH 4 DAYS Performed at [facility], [place] [address] [place], [place] [address]    Report Status PENDING  Incomplete     Labs: BNP (last 3 results) Recent Labs    10/02/17 1537  BNP 939.0*   Basic Metabolic Panel: Recent Labs  Lab 04/05/18 1624 04/06/18 0608 04/07/18 0618 04/08/18 0630 04/09/18 0616  NA 137 137 140 137 134*  K 4.6 4.6 4.1 3.9 4.3  CL 99 102 106 103 98  CO2 26 26 28 28 26   GLUCOSE 110* 119* 94 68* 61*  BUN 25* 28* 26* 16 18  CREATININE 1.50* 1.47* 1.26* 0.86 1.14*  CALCIUM 8.8* 7.8* 8.2* 8.5* 8.5*   Liver Function Tests: Recent Labs  Lab 04/05/18 1624 04/06/18 0608 04/07/18 0618 04/08/18 0630 04/09/18 0616  AST 231* 119* 59* 39 28  ALT 135* 95* 66* 53* 42  ALKPHOS 242* 176* 154* 168* 164*  BILITOT 4.0* 1.9* 1.0 0.9 1.2  PROT 5.7* 4.9* 4.6* 4.9* 5.0*  ALBUMIN 3.0* 2.6* 2.6* 2.6* 2.8*   Recent Labs  Lab 04/05/18 1624 04/07/18 0618 04/09/18 0616  LIPASE 876* 70* 46   No results for input(s): AMMONIA in the last 168 hours. CBC: Recent Labs  Lab 04/05/18 1624 04/06/18 0608 04/06/18 1209 04/07/18 0618 04/09/18 0616  WBC 13.9* 15.2* 13.2* 8.4 5.7  NEUTROABS 12.1*  --  10.7*  --   --   HGB 11.3* 10.2* 10.3* 9.3* 10.9*  HCT 35.2* 32.7* 33.3* 30.0* 34.6*  MCV 105.4* 103.5* 107.8* 106.0* 101.2*  PLT 149* 122* 102* 103* 141*   Cardiac Enzymes: No results for input(s): CKTOTAL, CKMB, CKMBINDEX, TROPONINI in the last 168 hours. BNP: Invalid input(s): POCBNP CBG: Recent Labs  Lab  04/08/18 1202 04/08/18 1746  GLUCAP 95 84   D-Dimer No results for input(s): DDIMER in the last 72 hours. Hgb A1c No results for input(s): HGBA1C in the last 72 hours. Lipid Profile No results for input(s): CHOL, HDL, LDLCALC, TRIG, CHOLHDL, LDLDIRECT in the last 72 hours. Thyroid function studies No results for input(s): TSH, T4TOTAL, T3FREE, THYROIDAB in the last 72 hours.  Invalid input(s): FREET3 Anemia work up No results for input(s): VITAMINB12, FOLATE, FERRITIN, TIBC, IRON, RETICCTPCT in the last 72 hours. Urinalysis    Component Value Date/Time   COLORURINE AMBER (A) 04/05/2018 1531   APPEARANCEUR CLEAR 04/05/2018 1531   LABSPEC 1.014 04/05/2018 1531   PHURINE 5.0 04/05/2018 1531   GLUCOSEU NEGATIVE 04/05/2018 1531   HGBUR SMALL (A) 04/05/2018 1531   HGBUR small 12/17/2007 1124   BILIRUBINUR NEGATIVE 04/05/2018 1531   BILIRUBINUR negative 02/08/2018 1640   BILIRUBINUR NEG 05/26/2016 1700   KETONESUR NEGATIVE 04/05/2018 1531   PROTEINUR 30 (A) 04/05/2018 1531   UROBILINOGEN 0.2 02/08/2018 1640   UROBILINOGEN 0.2 08/29/2013 2142   NITRITE NEGATIVE 04/05/2018 1531   LEUKOCYTESUR NEGATIVE 04/05/2018 1531   Sepsis Labs Invalid input(s): PROCALCITONIN,  WBC,  LACTICIDVEN Microbiology Recent Results (from the past 240 hour(s))  Urine culture     Status: Abnormal   Collection Time: 04/05/18  3:41 PM  Result Value Ref Range Status   Specimen Description   Final    URINE, CLEAN CATCH Performed at [facility], [place] [address] [place], [place] [address]    Special Requests   Final    NONE Performed at [facility], [place] [address] [place], [place] [address]    Culture 10,000 COLONIES/mL ESCHERICHIA COLI (A)  Final   Report Status 04/08/2018 FINAL  Final   Organism ID, Bacteria ESCHERICHIA COLI (A)  Final      Susceptibility   Escherichia coli - MIC*    AMPICILLIN <=2 SENSITIVE Sensitive     CEFAZOLIN <=4 SENSITIVE Sensitive      CEFTRIAXONE <=1 SENSITIVE Sensitive     CIPROFLOXACIN <=0.25 SENSITIVE Sensitive     GENTAMICIN <=1 SENSITIVE Sensitive     IMIPENEM <=0.25 SENSITIVE Sensitive     NITROFURANTOIN <=16 SENSITIVE Sensitive     TRIMETH/SULFA <=20 SENSITIVE Sensitive     AMPICILLIN/SULBACTAM <=2 SENSITIVE Sensitive     PIP/TAZO <=4 SENSITIVE Sensitive     Extended ESBL NEGATIVE Sensitive     * 10,000 COLONIES/mL ESCHERICHIA COLI  Culture, blood (routine x 2)     Status: None (Preliminary result)   Collection Time: 04/06/18 12:09 PM  Result Value Ref Range Status   Specimen Description   Final    BLOOD RIGHT ARM Performed at [facility] [address], [place], [place] [address]    Special Requests   Final    BOTTLES DRAWN AEROBIC ONLY Blood Culture results may not be optimal due to an inadequate volume of blood received in culture bottles Performed at [facility] [address], [place], [place] [address]    Culture   Final    NO GROWTH 4 DAYS Performed at [facility], [place] [address] [place], [place] [address]  Report Status PENDING  Incomplete  Culture, blood (routine x 2)     Status: None (Preliminary result)   Collection Time: 04/06/18 12:09 PM  Result Value Ref Range Status   Specimen Description   Final    BLOOD RIGHT HAND Performed at [facility] [address], [place], [place] [address]    Special Requests   Final    BOTTLES DRAWN AEROBIC AND ANAEROBIC Blood Culture results may not be optimal due to an inadequate volume of blood received in culture bottles Performed at [facility] [address] [place], [place] [address]    Culture   Final    NO GROWTH 4 DAYS Performed at [facility], [place] [address], [place], [place] [address]    Report Status PENDING  Incomplete     Time coordinating discharge: 40 minutes       SIGNED:   Edwin Dada, MD  [facility] 04/10/2018, 2:59 PM

## 2018-04-10 NOTE — Care Management Note (Signed)
Case Management Note  Patient Details  Name: Jordan [name] MRN: [id number] Date of Birth: [birth year]  Subjective/Objective:                  Gallstone pancreatitis -tolerating clear liquids.  Adv to low fat diet.   -no plans for surgical intervention.  Action/Plan: Following for progression of care and condition. Following for cm needs none present at this time.  Expected Discharge Date:  04/08/18               Expected Discharge Plan:  Home/Self Care  In-House Referral:     Discharge planning Services  CM Consult  Post Acute Care Choice:    Choice offered to:     DME Arranged:    DME Agency:     HH Arranged:    HH Agency:     Status of Service:  In process, will continue to follow  If discussed at Long Length of Stay Meetings, dates discussed:    Additional Comments:  Leeroy Cha, RN 04/10/2018, 10:37 AM

## 2018-04-10 NOTE — Progress Notes (Signed)
Family would like to have home health set up for patient at discharge with Jordan [name]

## 2018-04-10 NOTE — Progress Notes (Signed)
Patient ID: Jordan [name], female   DOB: May 03, 1919, 84 y.o.   MRN: [id number]       Subjective: Patient still having some back pain, but no abdominal pain.  Tolerated her clear liquids with no nausea or vomiting yesterday.  Objective: Vital signs in last 24 hours: Temp:  [98.2 F (36.8 C)-99.5 F (37.5 C)] 98.4 F (36.9 C) (12/10 0551) Pulse Rate:  [74-90] 80 (12/10 0551) Resp:  [16-20] 20 (12/10 0551) BP: (153-187)/(66-81) 164/73 (12/10 0551) SpO2:  [96 %-98 %] 96 % (12/10 0551) Last BM Date: 04/08/18  Intake/Output from previous day: 12/09 0701 - 12/10 0700 In: 300 [P.O.:300] Out: -  Intake/Output this shift: Total I/O In: 660 [P.O.:660] Out: -   PE: Abd: soft, NT, ND, +BS, hernias present and soft and reducible.  Lab Results:  Recent Labs    04/09/18 0616  WBC 5.7  HGB 10.9*  HCT 34.6*  PLT 141*   BMET Recent Labs    04/08/18 0630 04/09/18 0616  NA 137 134*  K 3.9 4.3  CL 103 98  CO2 28 26  GLUCOSE 68* 61*  BUN 16 18  CREATININE 0.86 1.14*  CALCIUM 8.5* 8.5*   PT/INR No results for input(s): LABPROT, INR in the last 72 hours. CMP     Component Value Date/Time   NA 134 (L) 04/09/2018 0616   NA 135 10/02/2017 1537   K 4.3 04/09/2018 0616   CL 98 04/09/2018 0616   CO2 26 04/09/2018 0616   GLUCOSE 61 (L) 04/09/2018 0616   BUN 18 04/09/2018 0616   BUN 26 10/02/2017 1537   CREATININE 1.14 (H) 04/09/2018 0616   CREATININE 1.26 (H) 05/26/2016 1645   CALCIUM 8.5 (L) 04/09/2018 0616   PROT 5.0 (L) 04/09/2018 0616   PROT 5.8 (L) 10/02/2017 1537   ALBUMIN 2.8 (L) 04/09/2018 0616   ALBUMIN 3.4 10/02/2017 1537   AST 28 04/09/2018 0616   ALT 42 04/09/2018 0616   ALKPHOS 164 (H) 04/09/2018 0616   BILITOT 1.2 04/09/2018 0616   BILITOT 0.3 10/02/2017 1537   GFRNONAA 41 (L) 04/09/2018 0616   GFRNONAA 37 (L) 05/26/2016 1645   GFRAA 47 (L) 04/09/2018 0616   GFRAA 42 (L) 05/26/2016 1645   Lipase     Component Value Date/Time   LIPASE 46 04/09/2018 0616        Studies/Results: Ct Chest W Contrast  Result Date: 04/08/2018 CLINICAL DATA:  Abnormal chest radiograph EXAM: CT CHEST WITH CONTRAST TECHNIQUE: Multidetector CT imaging of the chest was performed during intravenous contrast administration. CONTRAST:  64mL OMNIPAQUE IOHEXOL 300 MG/ML  SOLN COMPARISON:  Chest radiograph dated 04/08/2018. Partial comparison to CT abdomen/pelvis dated 04/05/2018. CT chest dated 05/14/2016. FINDINGS: Cardiovascular: Heart is normal in size.  No pericardial effusion. No evidence of thoracic aortic aneurysm. Atherosclerotic calcifications of the aortic arch. Three vessel coronary atherosclerosis. Left subclavian pacemaker. Mediastinum/Nodes: No suspicious mediastinal lymphadenopathy. Visualized thyroid is unremarkable. Lungs/Pleura: Biapical pleural-parenchymal scarring. No focal consolidation. No suspicious pulmonary nodules. Small left pleural effusion, new from recent CT abdomen/pelvis, likely accounting for the radiographic abnormality. Associated mild left basilar atelectasis. Trace right pleural effusion. Upper Abdomen: Visualized upper abdomen is grossly unremarkable. Musculoskeletal: Stable mild compression fracture at T10. Degenerative changes of the visualized thoracolumbar spine. IMPRESSION: Small left pleural effusion, new from recent CT abdomen/pelvis, likely accounting for the radiographic abnormality. Associated mild left basilar atelectasis. Trace right pleural effusion. Aortic Atherosclerosis (ICD10-I70.0). Electronically Signed   By: Henderson Newcomer.D.  On: 04/08/2018 20:20   Dg Chest Port 1 View  Result Date: 04/08/2018 CLINICAL DATA:  Abnormal breath sounds. EXAM: PORTABLE CHEST 1 VIEW COMPARISON:  10/02/2017 FINDINGS: The permanent pacer wires are stable. Mild cardiac enlargement is stable. There is tortuosity and calcification of the thoracic aorta. Slightly prominent pulmonary hila are stable. Stable emphysematous changes and pulmonary  scarring with biapical pleural and parenchymal density. Rounded density at the left lung base is new since prior chest films and could be rounded atelectasis, infiltrate, loculated pleural fluid collection or mass. A small left pleural effusion is suspected. The bony structures are intact. A right reversed total shoulder arthroplasty is noted. IMPRESSION: Chronic bronchitic and emphysematous lung changes. Rounded masslike density at the left lung base as detailed above. Recommend chest CT with contrast (if possible) for further evaluation. Electronically Signed   By: Marijo Sanes M.D.   On: 04/08/2018 13:20    Anti-infectives: Anti-infectives (From admission, onward)   Start     Dose/Rate Route Frequency Ordered Stop   04/05/18 1845  piperacillin-tazobactam (ZOSYN) IVPB 3.375 g     3.375 g 100 mL/hr over 30 Minutes Intravenous  Once 04/05/18 1843 04/05/18 1931       Assessment/Plan Multiple medical problems  Gallstone pancreatitis -tolerating clear liquids.  Adv to low fat diet.   -no plans for surgical intervention. -she is surgically stable for DC home if tolerates low fat diet.  We will sign off.  Please call if needed.  No follow up warranted at this time. -may resume eliquis  FEN -low fat diet VTE -eliquis ID -none   LOS: 5 days    Henreitta Cea , [facility] 04/10/2018, 8:50 AM Pager: [phone]

## 2018-04-10 NOTE — Plan of Care (Signed)
Nutrition Education Note  RD consulted for nutrition education regarding a low residue diet   RD provided "Low Fiber Nutrition Therapy" handout in spanish from the Academy of [employer]. Reviewed patient's dietary recall with pt's son. Provided examples on ways to decrease fiber intake in the diet such as avoiding whole grains, seeds, nuts, raw vegetables,  fruits with skin, and connective tissue of meats. Encouraged low fat meats, cheeses, etc. Discouraged preparing foods in oils or butter. Encouraged grilling or baking foods rather than frying. Discussed with patient's son the importance of having adequate amounts of lean protein and avoiding saturated fats. Gave patient examples of meals and menu planning.   Teach back method used.  Expect good compliance with family assistance. RD spoke with pt's son as he typically prepares dinner meals for the patient in which she will then eat for lunch the next day as leftovers. Pt's son states her homecare nurse would be in charge of providing breakfast for patient.  Body mass index is 20.85 kg/m. Pt meets criteria for normal based on current BMI.  Current diet order is Heart Healthy, patient is consuming approximately 35% of meals at this time. Labs and medications reviewed. No further nutrition interventions warranted at this time. If additional nutrition issues arise, please re-consult RD.  Clayton Bibles, MS, RD, [place] [facility] Pager: [phone] After Hours Pager: [phone]

## 2018-04-10 NOTE — Telephone Encounter (Signed)
Pt daughter called to ask if Dr. McDiarmid can call her back concerning her mother. Cana has been in the hospital since last Thursday and her bp's have been running high a lot. Please call daughter back to discuss these concerns.

## 2018-04-11 LAB — CULTURE, BLOOD (ROUTINE X 2)
Culture: NO GROWTH
Culture: NO GROWTH

## 2018-04-13 ENCOUNTER — Telehealth: Payer: Self-pay

## 2018-04-13 DIAGNOSIS — I5032 Chronic diastolic (congestive) heart failure: Secondary | ICD-10-CM | POA: Diagnosis not present

## 2018-04-13 DIAGNOSIS — N183 Chronic kidney disease, stage 3 (moderate): Secondary | ICD-10-CM | POA: Diagnosis not present

## 2018-04-13 DIAGNOSIS — Z950 Presence of cardiac pacemaker: Secondary | ICD-10-CM | POA: Diagnosis not present

## 2018-04-13 DIAGNOSIS — I480 Paroxysmal atrial fibrillation: Secondary | ICD-10-CM | POA: Diagnosis not present

## 2018-04-13 DIAGNOSIS — K8020 Calculus of gallbladder without cholecystitis without obstruction: Secondary | ICD-10-CM | POA: Diagnosis not present

## 2018-04-13 DIAGNOSIS — F0280 Dementia in other diseases classified elsewhere without behavioral disturbance: Secondary | ICD-10-CM | POA: Diagnosis not present

## 2018-04-13 DIAGNOSIS — G894 Chronic pain syndrome: Secondary | ICD-10-CM | POA: Diagnosis not present

## 2018-04-13 DIAGNOSIS — K219 Gastro-esophageal reflux disease without esophagitis: Secondary | ICD-10-CM | POA: Diagnosis not present

## 2018-04-13 DIAGNOSIS — D631 Anemia in chronic kidney disease: Secondary | ICD-10-CM | POA: Diagnosis not present

## 2018-04-13 DIAGNOSIS — I130 Hypertensive heart and chronic kidney disease with heart failure and stage 1 through stage 4 chronic kidney disease, or unspecified chronic kidney disease: Secondary | ICD-10-CM | POA: Diagnosis not present

## 2018-04-13 DIAGNOSIS — G309 Alzheimer's disease, unspecified: Secondary | ICD-10-CM | POA: Diagnosis not present

## 2018-04-13 DIAGNOSIS — K859 Acute pancreatitis without necrosis or infection, unspecified: Secondary | ICD-10-CM | POA: Diagnosis not present

## 2018-04-13 DIAGNOSIS — Z7901 Long term (current) use of anticoagulants: Secondary | ICD-10-CM | POA: Diagnosis not present

## 2018-04-13 NOTE — Telephone Encounter (Signed)
Donita, RN with [facility], called for verbal orders:  1. Follow for [facility] nursing after discharge, 1x/week x 9 weeks.  Call back is [phone]  Danley Danker, RN [facility])

## 2018-04-15 DIAGNOSIS — K8020 Calculus of gallbladder without cholecystitis without obstruction: Secondary | ICD-10-CM | POA: Diagnosis not present

## 2018-04-15 DIAGNOSIS — I5032 Chronic diastolic (congestive) heart failure: Secondary | ICD-10-CM | POA: Diagnosis not present

## 2018-04-15 DIAGNOSIS — F0280 Dementia in other diseases classified elsewhere without behavioral disturbance: Secondary | ICD-10-CM | POA: Diagnosis not present

## 2018-04-15 DIAGNOSIS — G309 Alzheimer's disease, unspecified: Secondary | ICD-10-CM | POA: Diagnosis not present

## 2018-04-15 DIAGNOSIS — K859 Acute pancreatitis without necrosis or infection, unspecified: Secondary | ICD-10-CM | POA: Diagnosis not present

## 2018-04-15 DIAGNOSIS — I130 Hypertensive heart and chronic kidney disease with heart failure and stage 1 through stage 4 chronic kidney disease, or unspecified chronic kidney disease: Secondary | ICD-10-CM | POA: Diagnosis not present

## 2018-04-16 ENCOUNTER — Telehealth: Payer: Self-pay

## 2018-04-16 DIAGNOSIS — I130 Hypertensive heart and chronic kidney disease with heart failure and stage 1 through stage 4 chronic kidney disease, or unspecified chronic kidney disease: Secondary | ICD-10-CM | POA: Diagnosis not present

## 2018-04-16 DIAGNOSIS — K859 Acute pancreatitis without necrosis or infection, unspecified: Secondary | ICD-10-CM | POA: Diagnosis not present

## 2018-04-16 DIAGNOSIS — I5032 Chronic diastolic (congestive) heart failure: Secondary | ICD-10-CM | POA: Diagnosis not present

## 2018-04-16 DIAGNOSIS — K8020 Calculus of gallbladder without cholecystitis without obstruction: Secondary | ICD-10-CM | POA: Diagnosis not present

## 2018-04-16 DIAGNOSIS — F0280 Dementia in other diseases classified elsewhere without behavioral disturbance: Secondary | ICD-10-CM | POA: Diagnosis not present

## 2018-04-16 DIAGNOSIS — G309 Alzheimer's disease, unspecified: Secondary | ICD-10-CM | POA: Diagnosis not present

## 2018-04-16 NOTE — Telephone Encounter (Signed)
LM for Donita with verbal orders.  Jules Baty,CMA

## 2018-04-16 NOTE — Telephone Encounter (Signed)
Please call order for PT 2x for 3 wks and 1x for 5 wks

## 2018-04-16 NOTE — Telephone Encounter (Signed)
Jordan [name] with [facility] requesting VO for physical therapy.  PT 2x for 3weeks and 1x for 5weeks  Please call Jordan [name] [phone] with VO.

## 2018-04-16 NOTE — Telephone Encounter (Signed)
Verbal orders given to [facility].  Azadeh Hyder,CMA

## 2018-04-16 NOTE — Telephone Encounter (Signed)
Please call orders for [facility] nurse after discharge, 1x/wk x 9 wks

## 2018-04-17 ENCOUNTER — Other Ambulatory Visit: Payer: Self-pay | Admitting: Family Medicine

## 2018-04-17 ENCOUNTER — Telehealth: Payer: Self-pay

## 2018-04-17 DIAGNOSIS — G894 Chronic pain syndrome: Secondary | ICD-10-CM

## 2018-04-17 NOTE — Telephone Encounter (Signed)
Please call in Orders for OT  HHOT 1x/wk x 2 wk, 2x/wk x 2 wk, 1 x wk x 1 wk for safety training and self-care training

## 2018-04-17 NOTE — Telephone Encounter (Signed)
Reeto, OT with [facility], called for verbal orders:  HH OT 1x/week x 2, 2x/week x 2, 1x/week x 1 for safety training and self-care training.  Call back is [phone]  Danley Danker, RN [facility] [place])

## 2018-04-18 ENCOUNTER — Other Ambulatory Visit: Payer: Self-pay

## 2018-04-18 ENCOUNTER — Ambulatory Visit (INDEPENDENT_AMBULATORY_CARE_PROVIDER_SITE_OTHER): Payer: Medicare Other | Admitting: Family Medicine

## 2018-04-18 ENCOUNTER — Encounter: Payer: Self-pay | Admitting: Family Medicine

## 2018-04-18 VITALS — BP 130/42 | HR 66 | Temp 97.5°F | Ht 62.0 in | Wt 109.2 lb

## 2018-04-18 DIAGNOSIS — K859 Acute pancreatitis without necrosis or infection, unspecified: Secondary | ICD-10-CM | POA: Diagnosis not present

## 2018-04-18 DIAGNOSIS — Z23 Encounter for immunization: Secondary | ICD-10-CM

## 2018-04-18 NOTE — Telephone Encounter (Signed)
Tried calling number listed for call back but there was no identified VM.  Will try again later today.  [name],CMA

## 2018-04-18 NOTE — Patient Instructions (Signed)
You can try to expand her diet to other foods, those that she would like to eat.  I will let you know the results of her lab tests.  They should be back tomorrow or Friday.    We need to continue to ensure she is eating well and staying hydrated to increase her blood pressure and energy levels.  Come back to see myself or Dr. McDiarmid next week to make sure she is doing ok.  Have a great Christmas and New Year!

## 2018-04-18 NOTE — Progress Notes (Signed)
Subjective:    Jordan [name] is a 84 y.o. female with known dementia, home-dwelling, HTN, dCHF, Afib with pacer, on Eliquis, and hx bronchiectasis who presents to [facility] today for hospital FU:  1.  Hospital FU:  Patient admitted for biliary pancreatitis.  Surgery avoided due to multiple co-morbidities of patient.  Fortunately her symptoms and lab results all normalized.  While in the hospital also found to have some rales on exam, CXR revealed mild pulm effusion, thought to be inflammatory from pancreatitis.  Discharged after tolerating soft bland diet.  Recommendations on follow-up were to repeat her labwork from the hospital, namely CBC and metabolic profile.    Since discharge: Has not gained much strength.  For the past several days she has had completely no abdominal pain whatsoever.  Still with some mild lower back pain lumbar sacral area.  Her daughter provides much of the history and states that she is "tired" all the time.  Eating only rice and some toast.  She and the patient are both afraid to eat anything more for fear of re-triggering her pancreatitis.  No nausea or vomiting.   ROS as above per HPI.    The following portions of the patient's history were reviewed and updated as appropriate: allergies, current medications, past medical history, family and social history, and problem list. Patient is a nonsmoker.    PMH reviewed.  Past Medical History:  Diagnosis Date  . AKI (acute kidney injury) ([place])   . Alzheimer's dementia [facility]) 07/30/2013   04/17/14 MoCA - Blind (Spanish version); 9 out of 22.  Correct MoCA = 12 out of 30.    . Arthritis   . Atherosclerosis of aorta ([place]) 10/05/2015  . Back pain 10/05/2015  . Bronchiectasis ([place]) 10/03/2017  . Calcification of native coronary artery 10/05/2015   Chest CT 2011.   . Carpal tunnel syndrome 06/29/2006   Qualifier: Diagnosis of  By: Benna Dunks    . Cervicalgia   . Cervicalgia 05/18/2009   Qualifier: Diagnosis of  By: Walker Kehr MD, Patrick Jupiter    .  CHF (congestive heart failure) ([place])   . Chronic bilateral thoracic back pain 10/05/2015  . Chronic constipation   . Chronic diastolic heart failure ([place]) 09/01/2013  . Chronic pain syndrome 05/09/2014  . CKD (chronic kidney disease) stage 3, GFR 30-59 ml/min (HCC) 09/03/2013  . Combined visual hearing impairment 11/12/2013  . Complete heart block ([place])   . Constipation   . Cryptogenic stroke ([place]) S/P  LOOP RECORDER IMPLANT   DX  01/2013  --  SYNCOPAL EPISODE --  EPISODIC  ATRIAL TACHY/ FIBULATION  AND PAUSES  . CYSTOCELE/RECTOCELE/PROLAPSE,UNSPEC. 06/29/2006   Qualifier: Diagnosis of  By: Benna Dunks    . Depressive disorder 06/29/2006       . Dilation of esophagus 06/16/2017   Barium Esophagram 06/25/17: Poor esophageal motility. Dilated esophagus with tertiary contractions.  Smooth narrowing distal esophagus similar to the prior study.  Moderate narrowing. Barium tablet passed through this area after several sips of water.  Marland Kitchen Displaced fracture of fifth metatarsal bone, right foot, subsequent encounter for fracture with routine healing 06/05/2017  . Diverticulosis of colon   . DIVERTICULOSIS OF COLON 06/29/2006   Qualifier: Diagnosis of  By: Benna Dunks    . Dry eyes   . Dysphagia 09/15/2015  . Fall from bed 03/17/2016  . Fatigue   . Food impaction of esophagus   . Frequency of urination   . Gait instability   . Generalized osteoarthritis  of multiple sites 06/15/2017  . Glaucoma 04/18/2014  . Glaucoma of both eyes   . Gout 09/21/2013  . Hand Heaviness 03/17/2016  . Headache   . Hearing loss of aging 05/09/2014  . Hearing loss of both ears    no hearing aids  . HERNIA, INCISIONAL VENTRAL W/O OBST/GNGR 10/25/2006   Has been seen by surgery in the past and told that this is not dangerous and should only be treated particularly bothersome    . History of acute gouty arthritis 09/21/2013  . History of colon polyps   . History of Food impaction of esophagus   . HTN (hypertension) 06/29/2006    Isolated Systolic Hypertension - Ambulatory BP  Monitoring result 4/28-29/2015   . Hyperlipidemia 02/27/2015  . Hyperplastic colon polyp 2006   colonoscope  . Hypertension   . Hypochloremia   . Hyponatremia   . Hypothyroidism   . Impaired mobility and ADLs 05/30/2014  . Influenza A 05/14/2016  . Insomnia   . INTERSTITIAL CYSTITIS 09/21/2009   Qualifier: Diagnosis of  By: Sarita Haver  MD, Coralyn Mark    . Loss of weight 04/05/2010   Qualifier: Diagnosis of  By: Walker Kehr MD, Patrick Jupiter    . Lumbar stenosis   . Macular degeneration, age related, nonexudative 04/18/2014  . MASS, LUNG 04/05/2010   Qualifier: Diagnosis of  By: Walker Kehr MD, Patrick Jupiter    . Mobitz type 2 second degree heart block 08/29/2013  . Moderate dementia ([place])   . Narrowing of Distal Esophagus 06/16/2017   Barium Esophagram (06/15/17) Poor esophageal motility. Dilated esophagus with tertiary contractions.  Smooth narrowing distal esophagus similar to the prior study.  Moderate narrowing. Barium tablet passed through this area after several sips of water.   . OA (osteoarthritis)    RIGHT SHOULDER AC JOINT  . Orthostasis 04/28/2016  . OSTEOARTHRITIS, MULTI SITES 06/29/2006   Qualifier: Diagnosis of  By: Benna Dunks    . Osteoporosis   . OSTEOPOROSIS 03/20/2008   Qualifier: Diagnosis of  By: Jerline Pain MD, Anderson Malta    . Pacemaker   . 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 Peripheral neuropathy   . PERIPHERAL NEUROPATHY 03/29/2010   Qualifier: Diagnosis of  By: Walker Kehr MD, Patrick Jupiter    . Persistent headaches 09/06/2013  . Polymyalgia rheumatica ([place]) 11/08/2013  . Presbyesophagus (senile esophageal dysmotility) 06/16/2017  . Presence of permanent cardiac pacemaker   . Rib pain on left side 10/05/2015  . Right knee pain 01/15/2014  . Right rotator cuff tear   . Rotator cuff tear  arthropathy 09/11/2012   Likely related to rotator cuff tendinopathy seen on musculoskeletal ultrasound, and GH DJD.  Complete tear of right supra and infraspinatus on MRI 10/2012     . S/P shoulder replacement 03/20/2015  . Shortness of breath 07/21/2014  . Shortness of breath dyspnea   . Spinal stenosis of lumbar region 10/03/2012   Patient has significant low back pain related to spinal stenosis and DJD, DDD.  We had a long discussion about treatment options which are quite limited. She is likely not a good surgical candidate for laminectomy. I am not sure about the usefulness of epidural steroid injection. We'll plan on restarting meloxicam after a long discussion of the pros and cons and risks of chronic kidney disease.  The patient and her family feel that the  benefit of good pain control is the small risk.  Additionally she will followup with her primary care provider for creatinine monitoring.  Will obtain physical therapy for TENS unit trial.  Will refer to pain management for consideration of epidural steroid injection as that worked well in the past for cervical pain Followup here as needed   . SUI (stress urinary incontinence, female)   . Syncope and collapse 02/09/2013  . Urinary frequency 06/05/2015  . Urinary incontinence, mixed 06/29/2006   Qualifier: Diagnosis of  By: Benna Dunks    . UTI (urinary tract infection) 05/27/2016  . UTI (urinary tract infection) 05/2017  . Vertigo 10/18/2010  . Weakness 08/29/2013   Past Surgical History:  Procedure Laterality Date  . CARPAL TUNNEL RELEASE Right   . ESOPHAGOGASTRODUODENOSCOPY N/A 09/15/2015   Procedure: ESOPHAGOGASTRODUODENOSCOPY (EGD);  Surgeon: Clarene Essex, MD;  Location: [facility] ENDOSCOPY;  Service: Endoscopy;  Laterality: N/A;  . EYE SURGERY     cataract, bilat.  Randolm Idol / REPLACE / REMOVE PACEMAKER    . LOOP RECORDER IMPLANT  02-08-13; 08-30-13   MDT LinQ implanted by Dr Lovena Le for syncope, explanted 08-30-13 after CHB identified  . LOOP  RECORDER IMPLANT N/A 02/11/2013   Procedure: LOOP RECORDER IMPLANT;  Surgeon: Evans Lance, MD;  Location: [facility] CATH LAB;  Service: Cardiovascular;  Laterality: N/A;  . PACEMAKER INSERTION  08-30-2013   MDT [id number] pacemaker implanted by Dr Rayann Heman for CHB  . PERMANENT PACEMAKER INSERTION N/A 08/30/2013   Procedure: PERMANENT PACEMAKER INSERTION;  Surgeon: Coralyn Mark, MD;  Location: [facility] CATH LAB;  Service: Cardiovascular;  Laterality: N/A;  . REVERSE SHOULDER ARTHROPLASTY Right 03/20/2015   Procedure: RIGHT REVERSE SHOULDER ARTHROPLASTY;  Surgeon: Netta Cedars, MD;  Location: [facility];  Service: Orthopedics;  Laterality: Right;  . TOTAL ABDOMINAL HYSTERECTOMY W/ BILATERAL SALPINGOOPHORECTOMY  03-15-2005    Medications reviewed. Current Outpatient Medications  Medication Sig Dispense Refill  . acetaminophen (TYLENOL) 650 MG CR tablet Take 650 mg by mouth 2 (two) times daily as needed for pain.    Marland Kitchen allopurinol (ZYLOPRIM) 100 MG tablet take 1 tablet by mouth once daily 30 tablet 12  . apixaban (ELIQUIS) 2.5 MG TABS tablet Take 2.5 mg by mouth 2 (two) times daily.    . beta carotene w/minerals (OCUVITE) tablet Take 1 tablet by mouth daily. 90 tablet 3  . COLCRYS 0.6 MG tablet TAKE 1 TABLET BY MOUTH TWICE DAILY 240 tablet 0  . cycloSPORINE (RESTASIS) 0.05 % ophthalmic emulsion Place 1 drop into both eyes at bedtime.    . diclofenac sodium (VOLTAREN) 1 % GEL Apply 2 g topically 4 (four) times daily. (Patient taking differently: Apply 2 g topically 4 (four) times daily as needed (pain). ) 100 g prn  . DULoxetine (CYMBALTA) 30 MG capsule Take 30 mg by mouth daily.  2  . esomeprazole (NEXIUM) 40 MG capsule Take 1 capsule (40 mg total) by mouth daily. 30 capsule PRN  . fesoterodine (TOVIAZ) 4 MG TB24 tablet Take 1 tablet (4 mg total) by mouth daily. 30 tablet PRN  . furosemide (LASIX) 20 MG tablet Take 1 tablet (20 mg total) by mouth daily as needed for fluid.    Marland Kitchen gabapentin (NEURONTIN) 100 MG capsule  TAKE 2 CAPSULES BY MOUTH TWICE A DAY 360 capsule 0  . levothyroxine (SYNTHROID, LEVOTHROID) 100 MCG tablet TAKE 1 TABLET BY MOUTH ONCE DAILY 90 tablet PRN  . Lidocaine 0.5 % GEL Apply 1 application topically daily as needed (back  pain).    . metoprolol succinate (TOPROL-XL) 25 MG 24 hr tablet Take 1 tablet (25 mg total) by mouth daily. 60 tablet 0  . ondansetron (ZOFRAN) 4 MG tablet Take 1 tablet (4 mg total) by mouth daily as needed for nausea or vomiting. 30 tablet 1  . polyethylene glycol (MIRALAX / GLYCOLAX) packet take 17 grams by mouth once daily (Patient taking differently: Take 17 g by mouth daily as needed for moderate constipation. ) 100 packet PRN  . TRAVATAN Z 0.004 % SOLN ophthalmic solution Place 1 drop into both eyes at bedtime.     . traZODone (DESYREL) 50 MG tablet Take 50 mg by mouth at bedtime.  2  . vitamin B-12 (CYANOCOBALAMIN) 1000 MCG tablet Take 1,000 mcg by mouth daily.     No current facility-administered medications for this visit.      Objective:   Physical Exam BP (!) 130/42   Pulse 66   Temp (!) 97.5 F (36.4 C) (Oral)   Ht 5\' 2"  (1.575 m)   Wt 109 lb 3.2 oz (49.5 kg)   SpO2 99%   BMI 19.97 kg/m  Gen: Thin female in wheelchair.  Awake and alert.  No acute distress. HEENT: EOMI,  MMM.  No conjunctival pallor. Cardiac:  Regular rate and rhythm Pulm:  Clear to auscultation bilaterally with good air movement.  No wheezes or rales noted.   Abd:  Soft/nondistended/nontender throughout entire exam. MSK: No tenderness along palpation of spine or lower back. Exts: Very thin.  Poor muscle tone.  No results found for this or any previous visit (from the past 72 hour(s)).

## 2018-04-18 NOTE — Telephone Encounter (Signed)
Verbal orders given to Reeto.   ,CMA

## 2018-04-19 LAB — CBC WITH DIFFERENTIAL/PLATELET
Basophils Absolute: 0 10*3/uL (ref 0.0–0.2)
Basos: 0 %
EOS (ABSOLUTE): 0.1 10*3/uL (ref 0.0–0.4)
EOS: 2 %
HEMATOCRIT: 33.9 % — AB (ref 34.0–46.6)
Hemoglobin: 11.2 g/dL (ref 11.1–15.9)
Immature Grans (Abs): 0 10*3/uL (ref 0.0–0.1)
Immature Granulocytes: 0 %
LYMPHS ABS: 1.3 10*3/uL (ref 0.7–3.1)
Lymphs: 27 %
MCH: 32.2 pg (ref 26.6–33.0)
MCHC: 33 g/dL (ref 31.5–35.7)
MCV: 97 fL (ref 79–97)
Monocytes Absolute: 0.4 10*3/uL (ref 0.1–0.9)
Monocytes: 7 %
Neutrophils Absolute: 3 10*3/uL (ref 1.4–7.0)
Neutrophils: 64 %
Platelets: 222 10*3/uL (ref 150–450)
RBC: 3.48 x10E6/uL — AB (ref 3.77–5.28)
RDW: 14.1 % (ref 12.3–15.4)
WBC: 4.8 10*3/uL (ref 3.4–10.8)

## 2018-04-19 LAB — COMPREHENSIVE METABOLIC PANEL
ALBUMIN: 3.2 g/dL (ref 3.2–4.6)
ALT: 15 IU/L (ref 0–32)
AST: 25 IU/L (ref 0–40)
Albumin/Globulin Ratio: 1.3 (ref 1.2–2.2)
Alkaline Phosphatase: 160 IU/L — ABNORMAL HIGH (ref 39–117)
BUN/Creatinine Ratio: 10 — ABNORMAL LOW (ref 12–28)
BUN: 14 mg/dL (ref 10–36)
Bilirubin Total: 0.4 mg/dL (ref 0.0–1.2)
CO2: 27 mmol/L (ref 20–29)
Calcium: 8.9 mg/dL (ref 8.7–10.3)
Chloride: 98 mmol/L (ref 96–106)
Creatinine, Ser: 1.38 mg/dL — ABNORMAL HIGH (ref 0.57–1.00)
GFR calc Af Amer: 37 mL/min/{1.73_m2} — ABNORMAL LOW (ref >59)
GFR calc non Af Amer: 32 mL/min/{1.73_m2} — ABNORMAL LOW (ref >59)
Globulin, Total: 2.4 g/dL (ref 1.5–4.5)
Glucose: 129 mg/dL — ABNORMAL HIGH (ref 65–99)
Potassium: 4.7 mmol/L (ref 3.5–5.2)
Sodium: 136 mmol/L (ref 134–144)
Total Protein: 5.6 g/dL — ABNORMAL LOW (ref 6.0–8.5)

## 2018-04-19 LAB — LIPASE: Lipase: 54 U/L (ref 14–85)

## 2018-04-20 ENCOUNTER — Telehealth: Payer: Self-pay | Admitting: Family Medicine

## 2018-04-20 ENCOUNTER — Encounter: Payer: Self-pay | Admitting: Family Medicine

## 2018-04-20 DIAGNOSIS — K8020 Calculus of gallbladder without cholecystitis without obstruction: Secondary | ICD-10-CM | POA: Diagnosis not present

## 2018-04-20 DIAGNOSIS — K859 Acute pancreatitis without necrosis or infection, unspecified: Secondary | ICD-10-CM | POA: Diagnosis not present

## 2018-04-20 DIAGNOSIS — F0280 Dementia in other diseases classified elsewhere without behavioral disturbance: Secondary | ICD-10-CM | POA: Diagnosis not present

## 2018-04-20 DIAGNOSIS — G309 Alzheimer's disease, unspecified: Secondary | ICD-10-CM | POA: Diagnosis not present

## 2018-04-20 DIAGNOSIS — I130 Hypertensive heart and chronic kidney disease with heart failure and stage 1 through stage 4 chronic kidney disease, or unspecified chronic kidney disease: Secondary | ICD-10-CM | POA: Diagnosis not present

## 2018-04-20 DIAGNOSIS — I5032 Chronic diastolic (congestive) heart failure: Secondary | ICD-10-CM | POA: Diagnosis not present

## 2018-04-20 NOTE — Assessment & Plan Note (Signed)
She is actually doing much better from a pancreatitis standpoint.  I discussed with him that he should try to liberalize her diet as comfortable.  Patient and daughter are much relieved by this. -We are going to recheck c-Met today.  Daughter requests recheck a lipase.  Believe this will likely be negative because of how well she is been doing. -She has had no abdominal pain whatsoever.  No need for any analgesics. -Overall she is a frail recently hospitalized elderly female. -Try to increase her back to a regular diet to help keep up as much strength as possible. - FU in 1 week to assess for continued improvement.

## 2018-04-20 NOTE — Telephone Encounter (Signed)
Called and spoke with patient's daughter Jordan [name].  Overall the patient is doing a little better as she is now eating a bit more.  They are still hesitant to liberalize her diet until the results back for a lipase.  Daughter is very much relieved that lipase and rest of her labs are normal.  They are going to advance her diet over the weekend and bring her back for check to see on her improvement next week.  As far as her labs her creatinine was little elevated but still within her baseline over the past several months.  Daughter wanted to have TSH added on but we are not able to do this.  Her last was in January of this year.  May be able to check this next visit although I am not sure it will be abnormal.  No other symptoms.

## 2018-04-26 DIAGNOSIS — F0280 Dementia in other diseases classified elsewhere without behavioral disturbance: Secondary | ICD-10-CM | POA: Diagnosis not present

## 2018-04-26 DIAGNOSIS — K8020 Calculus of gallbladder without cholecystitis without obstruction: Secondary | ICD-10-CM | POA: Diagnosis not present

## 2018-04-26 DIAGNOSIS — I130 Hypertensive heart and chronic kidney disease with heart failure and stage 1 through stage 4 chronic kidney disease, or unspecified chronic kidney disease: Secondary | ICD-10-CM | POA: Diagnosis not present

## 2018-04-26 DIAGNOSIS — I5032 Chronic diastolic (congestive) heart failure: Secondary | ICD-10-CM | POA: Diagnosis not present

## 2018-04-26 DIAGNOSIS — G309 Alzheimer's disease, unspecified: Secondary | ICD-10-CM | POA: Diagnosis not present

## 2018-04-26 DIAGNOSIS — K859 Acute pancreatitis without necrosis or infection, unspecified: Secondary | ICD-10-CM | POA: Diagnosis not present

## 2018-04-27 DIAGNOSIS — I5032 Chronic diastolic (congestive) heart failure: Secondary | ICD-10-CM | POA: Diagnosis not present

## 2018-04-27 DIAGNOSIS — F0280 Dementia in other diseases classified elsewhere without behavioral disturbance: Secondary | ICD-10-CM | POA: Diagnosis not present

## 2018-04-27 DIAGNOSIS — I130 Hypertensive heart and chronic kidney disease with heart failure and stage 1 through stage 4 chronic kidney disease, or unspecified chronic kidney disease: Secondary | ICD-10-CM | POA: Diagnosis not present

## 2018-04-27 DIAGNOSIS — K8020 Calculus of gallbladder without cholecystitis without obstruction: Secondary | ICD-10-CM | POA: Diagnosis not present

## 2018-04-27 DIAGNOSIS — K859 Acute pancreatitis without necrosis or infection, unspecified: Secondary | ICD-10-CM | POA: Diagnosis not present

## 2018-04-27 DIAGNOSIS — G309 Alzheimer's disease, unspecified: Secondary | ICD-10-CM | POA: Diagnosis not present

## 2018-04-30 ENCOUNTER — Telehealth: Payer: Self-pay | Admitting: Family Medicine

## 2018-04-30 NOTE — Telephone Encounter (Signed)
Mary from [facility] is calling to let Dr. McDiarmid know that this pt has cancelled her first two appointments for in home health 04/27/18 and 05/01/18. She does have another appointment scheduled for Thursday 05/03/18.  Pt daughter states that she is to tired for the appointments.

## 2018-05-01 DIAGNOSIS — K8020 Calculus of gallbladder without cholecystitis without obstruction: Secondary | ICD-10-CM | POA: Diagnosis not present

## 2018-05-01 DIAGNOSIS — I130 Hypertensive heart and chronic kidney disease with heart failure and stage 1 through stage 4 chronic kidney disease, or unspecified chronic kidney disease: Secondary | ICD-10-CM | POA: Diagnosis not present

## 2018-05-01 DIAGNOSIS — G309 Alzheimer's disease, unspecified: Secondary | ICD-10-CM | POA: Diagnosis not present

## 2018-05-01 DIAGNOSIS — K859 Acute pancreatitis without necrosis or infection, unspecified: Secondary | ICD-10-CM | POA: Diagnosis not present

## 2018-05-01 DIAGNOSIS — F0280 Dementia in other diseases classified elsewhere without behavioral disturbance: Secondary | ICD-10-CM | POA: Diagnosis not present

## 2018-05-01 DIAGNOSIS — I5032 Chronic diastolic (congestive) heart failure: Secondary | ICD-10-CM | POA: Diagnosis not present

## 2018-05-01 NOTE — Telephone Encounter (Signed)
Reviewed

## 2018-05-03 DIAGNOSIS — I5032 Chronic diastolic (congestive) heart failure: Secondary | ICD-10-CM | POA: Diagnosis not present

## 2018-05-03 DIAGNOSIS — K8020 Calculus of gallbladder without cholecystitis without obstruction: Secondary | ICD-10-CM | POA: Diagnosis not present

## 2018-05-03 DIAGNOSIS — I130 Hypertensive heart and chronic kidney disease with heart failure and stage 1 through stage 4 chronic kidney disease, or unspecified chronic kidney disease: Secondary | ICD-10-CM | POA: Diagnosis not present

## 2018-05-03 DIAGNOSIS — K859 Acute pancreatitis without necrosis or infection, unspecified: Secondary | ICD-10-CM | POA: Diagnosis not present

## 2018-05-03 DIAGNOSIS — G309 Alzheimer's disease, unspecified: Secondary | ICD-10-CM | POA: Diagnosis not present

## 2018-05-03 DIAGNOSIS — F0280 Dementia in other diseases classified elsewhere without behavioral disturbance: Secondary | ICD-10-CM | POA: Diagnosis not present

## 2018-05-04 DIAGNOSIS — K859 Acute pancreatitis without necrosis or infection, unspecified: Secondary | ICD-10-CM | POA: Diagnosis not present

## 2018-05-04 DIAGNOSIS — K8020 Calculus of gallbladder without cholecystitis without obstruction: Secondary | ICD-10-CM | POA: Diagnosis not present

## 2018-05-04 DIAGNOSIS — I5032 Chronic diastolic (congestive) heart failure: Secondary | ICD-10-CM | POA: Diagnosis not present

## 2018-05-04 DIAGNOSIS — F0280 Dementia in other diseases classified elsewhere without behavioral disturbance: Secondary | ICD-10-CM | POA: Diagnosis not present

## 2018-05-04 DIAGNOSIS — G309 Alzheimer's disease, unspecified: Secondary | ICD-10-CM | POA: Diagnosis not present

## 2018-05-04 DIAGNOSIS — I130 Hypertensive heart and chronic kidney disease with heart failure and stage 1 through stage 4 chronic kidney disease, or unspecified chronic kidney disease: Secondary | ICD-10-CM | POA: Diagnosis not present

## 2018-05-08 ENCOUNTER — Other Ambulatory Visit: Payer: Self-pay | Admitting: Family Medicine

## 2018-05-08 DIAGNOSIS — I130 Hypertensive heart and chronic kidney disease with heart failure and stage 1 through stage 4 chronic kidney disease, or unspecified chronic kidney disease: Secondary | ICD-10-CM | POA: Diagnosis not present

## 2018-05-08 DIAGNOSIS — G309 Alzheimer's disease, unspecified: Secondary | ICD-10-CM | POA: Diagnosis not present

## 2018-05-08 DIAGNOSIS — F0280 Dementia in other diseases classified elsewhere without behavioral disturbance: Secondary | ICD-10-CM | POA: Diagnosis not present

## 2018-05-08 DIAGNOSIS — K859 Acute pancreatitis without necrosis or infection, unspecified: Secondary | ICD-10-CM | POA: Diagnosis not present

## 2018-05-08 DIAGNOSIS — K8020 Calculus of gallbladder without cholecystitis without obstruction: Secondary | ICD-10-CM | POA: Diagnosis not present

## 2018-05-08 DIAGNOSIS — N3946 Mixed incontinence: Secondary | ICD-10-CM

## 2018-05-08 DIAGNOSIS — I5032 Chronic diastolic (congestive) heart failure: Secondary | ICD-10-CM | POA: Diagnosis not present

## 2018-05-10 DIAGNOSIS — K859 Acute pancreatitis without necrosis or infection, unspecified: Secondary | ICD-10-CM | POA: Diagnosis not present

## 2018-05-10 DIAGNOSIS — F0280 Dementia in other diseases classified elsewhere without behavioral disturbance: Secondary | ICD-10-CM | POA: Diagnosis not present

## 2018-05-10 DIAGNOSIS — I5032 Chronic diastolic (congestive) heart failure: Secondary | ICD-10-CM | POA: Diagnosis not present

## 2018-05-10 DIAGNOSIS — K8020 Calculus of gallbladder without cholecystitis without obstruction: Secondary | ICD-10-CM | POA: Diagnosis not present

## 2018-05-10 DIAGNOSIS — I130 Hypertensive heart and chronic kidney disease with heart failure and stage 1 through stage 4 chronic kidney disease, or unspecified chronic kidney disease: Secondary | ICD-10-CM | POA: Diagnosis not present

## 2018-05-10 DIAGNOSIS — G309 Alzheimer's disease, unspecified: Secondary | ICD-10-CM | POA: Diagnosis not present

## 2018-05-14 ENCOUNTER — Telehealth: Payer: Self-pay

## 2018-05-14 DIAGNOSIS — I130 Hypertensive heart and chronic kidney disease with heart failure and stage 1 through stage 4 chronic kidney disease, or unspecified chronic kidney disease: Secondary | ICD-10-CM | POA: Diagnosis not present

## 2018-05-14 DIAGNOSIS — K8020 Calculus of gallbladder without cholecystitis without obstruction: Secondary | ICD-10-CM | POA: Diagnosis not present

## 2018-05-14 DIAGNOSIS — G309 Alzheimer's disease, unspecified: Secondary | ICD-10-CM | POA: Diagnosis not present

## 2018-05-14 DIAGNOSIS — I5032 Chronic diastolic (congestive) heart failure: Secondary | ICD-10-CM | POA: Diagnosis not present

## 2018-05-14 DIAGNOSIS — K859 Acute pancreatitis without necrosis or infection, unspecified: Secondary | ICD-10-CM | POA: Diagnosis not present

## 2018-05-14 DIAGNOSIS — F0280 Dementia in other diseases classified elsewhere without behavioral disturbance: Secondary | ICD-10-CM | POA: Diagnosis not present

## 2018-05-14 NOTE — Telephone Encounter (Signed)
Physical Therapist Assistant of pt called nurse line to inform of BP readings during today session. PTA stated her BP was 176/79 at start of session, then 167/72 during the middle of session, and 174/80 at the end of session.   PTA stated the pts daughter has not been giving her her BP meds since her BP has been "fine" Unsure of when the last time pt has received her medication. Please call [name] with any advice.

## 2018-05-15 NOTE — Telephone Encounter (Signed)
Daughter informed.  States that she gave patient a half a pill yesterday and today her blood pressure was "fine".  Eryka Dolinger,CMA

## 2018-05-15 NOTE — Telephone Encounter (Signed)
Please ask [name] to give her mother one metoprolol succinate 25 mg tablet once a day when her mother's top blood pressure number is more than 160.     She should not give her mother a metoprolol is her mother's top blood pressure number is less than 130.

## 2018-05-16 DIAGNOSIS — G309 Alzheimer's disease, unspecified: Secondary | ICD-10-CM | POA: Diagnosis not present

## 2018-05-16 DIAGNOSIS — I5032 Chronic diastolic (congestive) heart failure: Secondary | ICD-10-CM | POA: Diagnosis not present

## 2018-05-16 DIAGNOSIS — I130 Hypertensive heart and chronic kidney disease with heart failure and stage 1 through stage 4 chronic kidney disease, or unspecified chronic kidney disease: Secondary | ICD-10-CM | POA: Diagnosis not present

## 2018-05-16 DIAGNOSIS — K859 Acute pancreatitis without necrosis or infection, unspecified: Secondary | ICD-10-CM | POA: Diagnosis not present

## 2018-05-16 DIAGNOSIS — K8020 Calculus of gallbladder without cholecystitis without obstruction: Secondary | ICD-10-CM | POA: Diagnosis not present

## 2018-05-16 DIAGNOSIS — F0280 Dementia in other diseases classified elsewhere without behavioral disturbance: Secondary | ICD-10-CM | POA: Diagnosis not present

## 2018-05-25 ENCOUNTER — Telehealth: Payer: Self-pay | Admitting: *Deleted

## 2018-05-25 NOTE — Telephone Encounter (Signed)
PT assistant calls to report BP of 180/90.  Pt denies CP/SOB or blurry vision.  Per PT, pts daughter does not seem to be giving BP meds everyday, only "when needed".  Daughter is not home now and PCS cant give meds to patient.  Advised that since pt does not have CP/SOB or blurred vision, ok to wait till Daughter/Son gets home but to call 911 if any of those symptoms occur.    [place] and advised of moms BP.  She will go home now and give her mom her medications.  When asked if she gives mom the meds everyday, she says "no, only when her BP is high in the AM".  Same warning signs given to daughter.  Will forward to MD for FYI. Maxine Huynh, [name], CMA

## 2018-05-27 ENCOUNTER — Other Ambulatory Visit: Payer: Self-pay | Admitting: Family Medicine

## 2018-05-28 NOTE — Telephone Encounter (Signed)
TC reviewed.  Jordan [name], patient's daughter, watches her mothers BP very closely and does her mother's medications appropriately in response to her mother's BP levels.   I am not concerned by [name]'s BP 190/90.    I am more concerned when her BP gets too low which has happened in past after dosing of patient's antihypertensive medications.

## 2018-05-30 DIAGNOSIS — K859 Acute pancreatitis without necrosis or infection, unspecified: Secondary | ICD-10-CM | POA: Diagnosis not present

## 2018-05-30 DIAGNOSIS — G309 Alzheimer's disease, unspecified: Secondary | ICD-10-CM | POA: Diagnosis not present

## 2018-05-30 DIAGNOSIS — I5032 Chronic diastolic (congestive) heart failure: Secondary | ICD-10-CM | POA: Diagnosis not present

## 2018-05-30 DIAGNOSIS — K8020 Calculus of gallbladder without cholecystitis without obstruction: Secondary | ICD-10-CM | POA: Diagnosis not present

## 2018-05-30 DIAGNOSIS — I130 Hypertensive heart and chronic kidney disease with heart failure and stage 1 through stage 4 chronic kidney disease, or unspecified chronic kidney disease: Secondary | ICD-10-CM | POA: Diagnosis not present

## 2018-05-30 DIAGNOSIS — F0280 Dementia in other diseases classified elsewhere without behavioral disturbance: Secondary | ICD-10-CM | POA: Diagnosis not present

## 2018-05-31 ENCOUNTER — Encounter: Payer: Self-pay | Admitting: Family Medicine

## 2018-05-31 ENCOUNTER — Other Ambulatory Visit: Payer: Self-pay

## 2018-05-31 ENCOUNTER — Ambulatory Visit (INDEPENDENT_AMBULATORY_CARE_PROVIDER_SITE_OTHER): Payer: Medicare Other | Admitting: Family Medicine

## 2018-05-31 VITALS — BP 136/60 | HR 73 | Temp 98.3°F | Wt 106.0 lb

## 2018-05-31 DIAGNOSIS — M1A0210 Idiopathic chronic gout, right elbow, without tophus (tophi): Secondary | ICD-10-CM | POA: Diagnosis not present

## 2018-05-31 DIAGNOSIS — R54 Age-related physical debility: Secondary | ICD-10-CM

## 2018-05-31 DIAGNOSIS — Z7901 Long term (current) use of anticoagulants: Secondary | ICD-10-CM | POA: Diagnosis not present

## 2018-05-31 DIAGNOSIS — I700 Atherosclerosis of aorta: Secondary | ICD-10-CM | POA: Diagnosis not present

## 2018-05-31 DIAGNOSIS — K5901 Slow transit constipation: Secondary | ICD-10-CM

## 2018-05-31 DIAGNOSIS — M546 Pain in thoracic spine: Secondary | ICD-10-CM

## 2018-05-31 DIAGNOSIS — N184 Chronic kidney disease, stage 4 (severe): Secondary | ICD-10-CM | POA: Diagnosis not present

## 2018-05-31 DIAGNOSIS — Z79891 Long term (current) use of opiate analgesic: Secondary | ICD-10-CM | POA: Diagnosis not present

## 2018-05-31 DIAGNOSIS — K851 Biliary acute pancreatitis without necrosis or infection: Secondary | ICD-10-CM | POA: Diagnosis not present

## 2018-05-31 DIAGNOSIS — F0280 Dementia in other diseases classified elsewhere without behavioral disturbance: Secondary | ICD-10-CM

## 2018-05-31 DIAGNOSIS — G8929 Other chronic pain: Secondary | ICD-10-CM | POA: Diagnosis not present

## 2018-05-31 DIAGNOSIS — I10 Essential (primary) hypertension: Secondary | ICD-10-CM | POA: Diagnosis not present

## 2018-05-31 DIAGNOSIS — M5489 Other dorsalgia: Secondary | ICD-10-CM | POA: Diagnosis not present

## 2018-05-31 DIAGNOSIS — R52 Pain, unspecified: Secondary | ICD-10-CM

## 2018-05-31 DIAGNOSIS — R634 Abnormal weight loss: Secondary | ICD-10-CM | POA: Diagnosis not present

## 2018-05-31 DIAGNOSIS — M8088XS Other osteoporosis with current pathological fracture, vertebra(e), sequela: Secondary | ICD-10-CM | POA: Diagnosis not present

## 2018-05-31 DIAGNOSIS — F329 Major depressive disorder, single episode, unspecified: Secondary | ICD-10-CM | POA: Diagnosis not present

## 2018-05-31 DIAGNOSIS — F32A Depression, unspecified: Secondary | ICD-10-CM

## 2018-05-31 DIAGNOSIS — G301 Alzheimer's disease with late onset: Secondary | ICD-10-CM | POA: Diagnosis not present

## 2018-05-31 MED ORDER — POLYETHYLENE GLYCOL 3350 17 G PO PACK: PACK | ORAL | 99 refills | Status: DC

## 2018-05-31 MED ORDER — ALLOPURINOL 100 MG PO TABS: 100.0000 mg | ORAL_TABLET | Freq: Every day | ORAL | 3 refills | Status: DC

## 2018-05-31 MED ORDER — MIRTAZAPINE 7.5 MG PO TABS: ORAL_TABLET | ORAL | 1 refills | Status: DC

## 2018-05-31 MED ORDER — DOCUSATE SODIUM 100 MG PO CAPS: 200.0000 mg | ORAL_CAPSULE | Freq: Two times a day (BID) | ORAL | 0 refills | Status: DC

## 2018-05-31 MED ORDER — TRAMADOL HCL 50 MG PO TABS: 25.0000 mg | ORAL_TABLET | Freq: Three times a day (TID) | ORAL | 0 refills | Status: DC | PRN

## 2018-05-31 MED ORDER — DICLOFENAC SODIUM 1 % TD GEL: 2.0000 g | Freq: Four times a day (QID) | TRANSDERMAL | 99 refills | Status: DC | PRN

## 2018-05-31 NOTE — Patient Instructions (Addendum)
Decrease the dose of trazodone to 25 mg at bedtime (half tablet).  Start Mirtazapine 7.5 mg tablet, one tablet a day at bedtime for 7 days, then start 2 tablets at bedtime (15 mg). This is to help with pain, sleep, and appetite.   Dr McDiarmid agree with your dosing the metoprolol 25 mg every other day and using your judgment as to changing of dosing scheduled based on your mother's blood pressure.   Gabapentin is a medication for pain.   It increases GABA neurotransmitter which helps quiet down irritated nerves.    The dose can be anywhere from 100 mg three times a day, to 1200 mg three times a day.  We can discuss changing the dose in the future.   We can discuss stopping the trazodone and stopping the duloxetine in the future based on how your mother is doing.   Try tramadol for moderate to severe pain, tramadol 50 mg tablet, start with half tablet (25 mg) every 8 hours if needed.  Jordan [name] may increase to a whole tablet if tolerates a half tablet and needs more pain relief.   Take with food.   Start colace for constipation

## 2018-06-01 ENCOUNTER — Other Ambulatory Visit: Payer: Self-pay

## 2018-06-04 ENCOUNTER — Telehealth: Payer: Self-pay

## 2018-06-04 MED ORDER — LIDOCAINE 0.5 % EX GEL: 1.0000 "application " | Freq: Every day | CUTANEOUS | 5 refills | Status: DC | PRN

## 2018-06-04 NOTE — Telephone Encounter (Signed)
Medication change given to pharmacy.  Hasan Douse,CMA

## 2018-06-04 NOTE — Telephone Encounter (Signed)
May change to lidocaine ointment 5% applied topically as needed for back pain

## 2018-06-04 NOTE — Telephone Encounter (Signed)
Pharmacy unable to obtain lidocaine 0.5% gel. Wants to know if ok to change to ointment or something different.  Please call pharmacy at [place], RN [facility] RN)

## 2018-06-05 ENCOUNTER — Encounter: Payer: Self-pay | Admitting: Family Medicine

## 2018-06-05 DIAGNOSIS — Z7901 Long term (current) use of anticoagulants: Secondary | ICD-10-CM | POA: Insufficient documentation

## 2018-06-05 NOTE — Assessment & Plan Note (Signed)
Established problem Controlled Continue current therapy regiment.  

## 2018-06-05 NOTE — Assessment & Plan Note (Signed)
Established problem. Stable. Prior antihypertensive therapies: Clonidine, Lasix, Hydralazine, Verapamil, chlorthalidone, and Amlodipine (leg edema)  Change dose metoprolol XL 25 mg every other day  [name] may use her judgment as to changing of dosing scheduled based on your mother's systolic  blood pressure, holding if less than 130 and giving metoprolol XL 25 mg every day, if needed for BP's greater than 160-170 range.

## 2018-06-05 NOTE — Assessment & Plan Note (Signed)
Start Mirtazapine 7.5 mg tablet, one tablet a day at bedtime for 7 days, then start 2 tablets at bedtime (15 mg). This amy to help with pain May increase gabapentin dose from currently 100 mg TID in future May stop duloxetine (renally dosed) if Mirtazapine helpful.   Try tramadol for moderate to severe pain, tramadol 50 mg tablet, start with half tablet (25 mg) every 8 hours if needed.  Jordan [name] may increase to a whole tablet if tolerates a half tablet and needs more pain relief.   Take with food.

## 2018-06-05 NOTE — Assessment & Plan Note (Signed)
Established problem. Stable. Continue current therapy Lab Results  Component Value Date   HCT 33.9 (L) 04/18/2018  Stable eGFR 04/18/18 = 32 mL/min

## 2018-06-05 NOTE — Assessment & Plan Note (Addendum)
Wt Readings from Last 3 Encounters:  05/31/18 106 lb (48.1 kg)  04/18/18 109 lb 3.2 oz (49.5 kg)  02/08/18 114 lb (51.7 kg)  06/2017  126 lbs  16% weight loss in one year. Most recent rapid weight loss likely secondary to acute biliary pancreatitis illness.   Longer term weight loss likely multifactorial from frailty, medications, chronic pain, dementia, CKD. Patient is not on specialized diet.  No salt/fat restriction.  Agree with offering nutritional supplement after meals.   Anticipate continued slow gradual decline from above processes.  [name] would like to restart Mirtazapine for weight stimulation and sleep induction. Rx Mirtazapine 7.5 mg daily x 7 days, then 2 tab (15 mg) daily.  May stop duloxetine in future.  Decrease trazodone to 25 mg qhs.  May perform stop trail trazodone in future if adequate response to Mirtazapine.

## 2018-06-05 NOTE — Assessment & Plan Note (Signed)
Established problem. Stable. Continue current therapy Not statin candidate given general pain status, no history symptomatic ASCVD.

## 2018-06-05 NOTE — Assessment & Plan Note (Signed)
Established problem Uncontrolled Adding Mirtazapine initial with titration to 15 mg daily. Will stop Duloxetine (currently dosed by SCr)

## 2018-06-05 NOTE — Assessment & Plan Note (Signed)
eGFR 04/18/18 = 32 mL/min K+ & HCO3 WNL Stable.

## 2018-06-05 NOTE — Assessment & Plan Note (Signed)
Established problem Controlled Continue current therapy regiment. Diet, Miralax and colace

## 2018-06-05 NOTE — Progress Notes (Signed)
Subjective:    Patient ID: Jordan [name], female    DOB: [birth year], 85 y.o.   MRN: [id number] NALIYA GISH is accompanied by daughter, Iver Nestle Sources of clinical information for visit is/are patient, relative(s), past medical records and 04/10/18 hospital discharge summary. Nursing assessment for this office visit was reviewed with the patient for accuracy and revision.  [name] declined Spanish interpreter services.  [name] acted as the Astronomer.  Previous Report(s) Reviewed: historical medical records, imaging reports: CT AP 04/05/18, office notes and telephone calls Depression screen [facility] 2/9 05/31/2018  Decreased Interest 0  Down, Depressed, Hopeless 0  PHQ - 2 Score 0  Altered sleeping -  Tired, decreased energy -  Change in appetite -  Feeling bad or failure about yourself  -  Trouble concentrating -  Moving slowly or fidgety/restless -  Suicidal thoughts -  PHQ-9 Score -  Some recent data might be hidden   Fall Risk  05/31/2018 04/18/2018 10/02/2017 06/13/2017 04/06/2017  Falls in the past year? 0 0 No Yes No  Number falls in past yr: - - - 2 or more -  Comment - - - - -  Injury with Fall? - - - Yes -  Comment - - - - -  Risk Factor Category  - - - High Fall Risk -  Risk for fall due to : - - - - -  Risk for fall due to: Comment - - - - -  Follow up - - - - -    History/P.E. limitations: dementia and communication barrier Language  Adult vaccines due  Topic Date Due  . TETANUS/TDAP  05/19/2019   There are no preventive care reminders to display for this patient. There are no preventive care reminders to display for this patient.   Chief Complaint  Patient presents with  . Hypertension  . Gout  . Pain     HPI  Problem List Items Addressed This Visit      High   Alzheimer's dementia ([place]) - Duration: several years - Management: physical and cognitve supportive care from patient's son, dgt, and HH Aide.  Recently, pt's longtime HH Aide left her service.  The  family is in process of hiring an adie. - Severity: Dependence in all iADLs, as well as dressing,bathing, transfers - Associated Symptoms: No hallucination, no paranoid behaviors, no agitation    Constipation - Primary - Duration: multiple years - Management: Miralax and docusate with adequate success - Severity: moderate if does not take medication - Associated Symptoms: no diarrhea, no N/V    Relevant Medications   polyethylene glycol (MIRALAX / GLYCOLAX) packet   docusate sodium (COLACE) 100 MG capsule      Weight loss, unintentional   Generalized pain     Medium   HTN (hypertension) (Chronic) Disease Monitoring  Blood pressure range: highly variable.  [place] visits BP measures sometime SBP 170.             Leg swelling: no  Chest pain: no   Dyspnea: no     Medication compliance: yes, [name] gives metoprolol XL 25 mg when SBP high and holds it when less than 130.   Medication Side Effects  Lightheadedness: no     Preventitive Healthcare:  Exercise: no   Diet Pattern: poor po intake  Salt Restriction: no     Chronic bilateral thoracic back pain  - Multiple thoracolumbar collapsed vertebra due to osteoporosis ([place]) on Chest CT 2018 -  Lumbar  spondylosis -  Pain worse with movement  - Pain in right paraspinal region -  Predominantly tx'd with APAP, Duloxetine, Gabapentin, Lidocaine 5% crm prn    Idiopathic gout - Duration: several years - Management: allopurinol 100 mg daily and colchicine BID. Allopurinol was stopped during last hospitalization.  Uncertain reason.  - Severity: no recent exacerbations - Associated Symptoms: no fever, no joint swelling    Chronic anticoagulation - Started several years ago for Afib - Taking Apixaban 2.5 mg BID     Unprioritized   Acute biliary pancreatitis, resolved - Hsopitalization 12/5 - 04/10/18 on [facility] hospitalist service. -      - [place] Lipase 800, LFTs 200s, CT AP with pancreatitis and gallstone with dilated CBD.    - GI and GS consulted, recommended avoid surgery given age, comorbidities and dementia.  - Patient eating less,     Other Visit Diagnoses           Relevant Medications   diclofenac sodium (VOLTAREN) 1 % GEL   traMADol (ULTRAM) 50 MG tablet   mirtazapine (REMERON) 7.5 MG tablet      SH: Smoking status noted  Review of Systems See hpi    Objective:   Physical Exam  VS reviewed GEN: Alert, Cooperative, Groomed, NAD, socially engaged, sitting in WC HEENT: wearing biaural hearing aids COR: RRR, No M/G LUNGS: BCTA, No Acc mm use EXT: No peripheral leg edema.  Psych: Euthymic, bright affect, prosodic speech, language relevant to situation    Assessment & Plan:  Visit Problem List with A/P  Alzheimer's dementia ([place]) FAST stage 6  Palliative Performance Score: 50% (no progression since 07/2017).  Behavioral and Psychological Symptoms of Dementia: none Nonpharmacologic treatments of patient: none Pharmacologic treatments of patient: none Supportive Interventions for caregiver (support group or individual counseling): Family hiring home health aide Long-term care planning: reside at home with family  Follow up surveillance appointment in 3 to 6 months: 3 month  Constipation Established problem Controlled Continue current therapy regiment. Diet, Miralax and colace  Frailty Established problem Continue gradual decline (+) fatigue, low energy expression, little to no walking, general weakness present, and (+) weight loss  May need repeat PT/OT home interventions to slow decline.   Atherosclerosis of aorta ([place]) Established problem. Stable. Continue current therapy Not statin candidate given general pain status, no history symptomatic ASCVD.    Collapsed vertebra due to osteoporosis ([place]) Established problem Controlled Continue current therapy regiment.   Weight loss, unintentional Wt Readings from Last 3 Encounters:  05/31/18 106 lb (48.1 kg)  04/18/18 109  lb 3.2 oz (49.5 kg)  02/08/18 114 lb (51.7 kg)  06/2017  126 lbs  16% weight loss in one year. Most recent rapid weight loss likely secondary to acute biliary pancreatitis illness.   Longer term weight loss likely multifactorial from frailty, medications, chronic pain, dementia, CKD. Patient is not on specialized diet.  No salt/fat restriction.  Agree with offering nutritional supplement after meals.   Anticipate continued slow gradual decline from above processes.  [name] would like to restart Mirtazapine for weight stimulation and sleep induction. Rx Mirtazapine 7.5 mg daily x 7 days, then 2 tab (15 mg) daily.  May stop duloxetine in future.  Decrease trazodone to 25 mg qhs.  May perform stop trail trazodone in future if adequate response to Mirtazapine.    HTN (hypertension) Established problem. Stable. Prior antihypertensive therapies: Clonidine, Lasix, Hydralazine, Verapamil, chlorthalidone, and Amlodipine (leg edema)  Change dose metoprolol XL 25  mg every other day  [name] may use her judgment as to changing of dosing scheduled based on your mother's systolic  blood pressure, holding if less than 130 and giving metoprolol XL 25 mg every day, if needed for BP's greater than 160-170 range.        Depressive disorder Established problem Uncontrolled Adding Mirtazapine initial with titration to 15 mg daily. Will stop Duloxetine (currently dosed by SCr)   Idiopathic gout Established problem Controlled Continue current therapy regiment. Allopurinol 100 mg daily and colchicine 0.6 mg BID prophylaxis  Chronic anticoagulation Established problem. Stable. Continue current therapy Lab Results  Component Value Date   HCT 33.9 (L) 04/18/2018  Stable eGFR 04/18/18 = 32 mL/min   Chronic kidney disease (CKD), stage IV (severe) (HCC) eGFR 04/18/18 = 32 mL/min K+ & HCO3 WNL Stable.   Generalized pain Start Mirtazapine 7.5 mg tablet, one tablet a day at bedtime for 7 days, then  start 2 tablets at bedtime (15 mg). This amy to help with pain May increase gabapentin dose from currently 100 mg TID in future May stop duloxetine (renally dosed) if Mirtazapine helpful.   Try tramadol for moderate to severe pain, tramadol 50 mg tablet, start with half tablet (25 mg) every 8 hours if needed.  Ms Sumpter may increase to a whole tablet if tolerates a half tablet and needs more pain relief.   Take with food.    Acute pancreatitis Resolved. Monitor for recurrence of biliary pancreatitis.

## 2018-06-05 NOTE — Assessment & Plan Note (Signed)
Established problem Controlled Continue current therapy regiment. Allopurinol 100 mg daily and colchicine 0.6 mg BID prophylaxis

## 2018-06-05 NOTE — Assessment & Plan Note (Signed)
FAST stage 6  Palliative Performance Score: 50% (no progression since 07/2017).  Behavioral and Psychological Symptoms of Dementia: none Nonpharmacologic treatments of patient: none Pharmacologic treatments of patient: none Supportive Interventions for caregiver (support group or individual counseling): Family hiring home health aide Long-term care planning: reside at home with family  Follow up surveillance appointment in 3 to 6 months: 3 month

## 2018-06-05 NOTE — Assessment & Plan Note (Signed)
Established problem Continue gradual decline (+) fatigue, low energy expression, little to no walking, general weakness present, and (+) weight loss  May need repeat PT/OT home interventions to slow decline.

## 2018-06-05 NOTE — Assessment & Plan Note (Signed)
Resolved. Monitor for recurrence of biliary pancreatitis.

## 2018-06-06 DIAGNOSIS — K859 Acute pancreatitis without necrosis or infection, unspecified: Secondary | ICD-10-CM | POA: Diagnosis not present

## 2018-06-06 DIAGNOSIS — G309 Alzheimer's disease, unspecified: Secondary | ICD-10-CM | POA: Diagnosis not present

## 2018-06-06 DIAGNOSIS — I5032 Chronic diastolic (congestive) heart failure: Secondary | ICD-10-CM | POA: Diagnosis not present

## 2018-06-06 DIAGNOSIS — F0280 Dementia in other diseases classified elsewhere without behavioral disturbance: Secondary | ICD-10-CM | POA: Diagnosis not present

## 2018-06-06 DIAGNOSIS — K8020 Calculus of gallbladder without cholecystitis without obstruction: Secondary | ICD-10-CM | POA: Diagnosis not present

## 2018-06-06 DIAGNOSIS — I130 Hypertensive heart and chronic kidney disease with heart failure and stage 1 through stage 4 chronic kidney disease, or unspecified chronic kidney disease: Secondary | ICD-10-CM | POA: Diagnosis not present

## 2018-06-13 ENCOUNTER — Telehealth: Payer: Self-pay | Admitting: Family Medicine

## 2018-06-13 NOTE — Telephone Encounter (Signed)
PT daughter [place] Kidney is calling and would like to speak with [name]. She said she talked to her yesterday but would like for her to call her back again. She also would like to check on the status of her mothers paper work from [facility] being completed.   The best call back number is [phone]

## 2018-06-13 NOTE — Telephone Encounter (Signed)
Spoke with daughter and let her know that the order was not in Dr. McDiarmid's box.  I have left a message with [facility] asking them to please refax this order.  Lynsey Ange,CMA

## 2018-06-19 NOTE — Telephone Encounter (Signed)
No notification has been received from pharmacy about this. Will call [facility] to start process. Danley Danker, RN [facility] RN)

## 2018-06-19 NOTE — Telephone Encounter (Signed)
Daughter called and states that patient's lidocaine needs prior approval from the insurance.  She would like to know the status of this process as they are waiting to hear from [place] so they can fill it at the pharmacy.  Will check with RN team to see if they have seen anything on this.  [name],CMA

## 2018-06-20 ENCOUNTER — Telehealth: Payer: Self-pay

## 2018-06-20 NOTE — Telephone Encounter (Signed)
Received call from patient daughter that Lidocaine ointment required a PA.  PA attempted through CoverMyMeds. Response was to call [facility].  Called [facility] and did PA over the phone. Quantity limit exception also done as representative says 150 g is for 90 days and is over the limit.  PA and QL approved.  PA case # [id number], 05/02/18-09/18/18 QL case # [id number], 05/02/18-06/20/19  [facility] notified.  Danley Danker, RN [facility] RN)

## 2018-06-21 ENCOUNTER — Telehealth: Payer: Self-pay | Admitting: Family Medicine

## 2018-06-21 NOTE — Telephone Encounter (Signed)
Jordan [name] contacted and informed of form ready for pick up.

## 2018-06-21 NOTE — Telephone Encounter (Signed)
Certification of serious health condition form dropped off for at front desk for completion.  Verified that patient section of form has been completed.  Last DOS/WCC with PCP was 05/31/18.  Placed form in team folder to be completed by clinical staff.  Crista Luria

## 2018-06-21 NOTE — Telephone Encounter (Signed)
Clinic portion filled out and placed in providers box fore review.

## 2018-06-21 NOTE — Telephone Encounter (Signed)
FMLA form completed and ready for pick up by patient's children.Marland Kitchen

## 2018-06-28 ENCOUNTER — Other Ambulatory Visit: Payer: Self-pay | Admitting: Family Medicine

## 2018-06-28 ENCOUNTER — Encounter: Payer: Self-pay | Admitting: Family Medicine

## 2018-06-28 ENCOUNTER — Other Ambulatory Visit: Payer: Self-pay

## 2018-06-28 ENCOUNTER — Ambulatory Visit (INDEPENDENT_AMBULATORY_CARE_PROVIDER_SITE_OTHER): Payer: Medicare Other | Admitting: Family Medicine

## 2018-06-28 ENCOUNTER — Ambulatory Visit
Admission: RE | Admit: 2018-06-28 | Discharge: 2018-06-28 | Disposition: A | Discharge status: Home / Self Care | Payer: Medicare Other | Source: Ambulatory Visit | Attending: Family Medicine | Admitting: Family Medicine

## 2018-06-28 VITALS — BP 110/68 | HR 69 | Temp 97.7°F | Wt 103.5 lb

## 2018-06-28 DIAGNOSIS — R54 Age-related physical debility: Secondary | ICD-10-CM | POA: Diagnosis not present

## 2018-06-28 DIAGNOSIS — M1612 Unilateral primary osteoarthritis, left hip: Secondary | ICD-10-CM | POA: Diagnosis not present

## 2018-06-28 DIAGNOSIS — M25552 Pain in left hip: Secondary | ICD-10-CM | POA: Diagnosis not present

## 2018-06-28 DIAGNOSIS — M5489 Other dorsalgia: Secondary | ICD-10-CM | POA: Diagnosis not present

## 2018-06-28 DIAGNOSIS — R52 Pain, unspecified: Secondary | ICD-10-CM

## 2018-06-28 DIAGNOSIS — E440 Moderate protein-calorie malnutrition: Secondary | ICD-10-CM | POA: Diagnosis not present

## 2018-06-28 DIAGNOSIS — J479 Bronchiectasis, uncomplicated: Secondary | ICD-10-CM

## 2018-06-28 DIAGNOSIS — I480 Paroxysmal atrial fibrillation: Secondary | ICD-10-CM | POA: Diagnosis not present

## 2018-06-28 DIAGNOSIS — I10 Essential (primary) hypertension: Secondary | ICD-10-CM

## 2018-06-28 DIAGNOSIS — G4700 Insomnia, unspecified: Secondary | ICD-10-CM | POA: Diagnosis not present

## 2018-06-28 DIAGNOSIS — S79912A Unspecified injury of left hip, initial encounter: Secondary | ICD-10-CM | POA: Diagnosis not present

## 2018-06-28 DIAGNOSIS — R634 Abnormal weight loss: Secondary | ICD-10-CM

## 2018-06-28 MED ORDER — TRAMADOL HCL 50 MG PO TABS: 25.0000 mg | ORAL_TABLET | Freq: Three times a day (TID) | ORAL | 2 refills | Status: DC | PRN

## 2018-06-28 NOTE — Patient Instructions (Addendum)
To stop duloxetine, start taking it every other day for a week, then every third day for a week, then stop.   May use Tramadol one tablet up to three times a day.  Call for refills.   Please go for a left hip Xray.

## 2018-06-29 ENCOUNTER — Encounter: Payer: Self-pay | Admitting: Family Medicine

## 2018-06-29 ENCOUNTER — Telehealth: Payer: Self-pay | Admitting: Family Medicine

## 2018-06-29 DIAGNOSIS — M25552 Pain in left hip: Secondary | ICD-10-CM | POA: Insufficient documentation

## 2018-06-29 DIAGNOSIS — E440 Moderate protein-calorie malnutrition: Secondary | ICD-10-CM | POA: Insufficient documentation

## 2018-06-29 DIAGNOSIS — E46 Unspecified protein-calorie malnutrition: Secondary | ICD-10-CM | POA: Insufficient documentation

## 2018-06-29 HISTORY — DX: Pain in left hip: M25.552

## 2018-06-29 NOTE — Assessment & Plan Note (Signed)
Established problem. Stable. Mild stage [name] dosing metoprolol XL 25 mg daily for now with BPs < 160 response, but will hold on days when morning SBP < 130.

## 2018-06-29 NOTE — Assessment & Plan Note (Signed)
Established problem. Stable. Continue current therapy  

## 2018-06-29 NOTE — Telephone Encounter (Signed)
Will forward to MD. Cage Gupton,CMA  

## 2018-06-29 NOTE — Assessment & Plan Note (Signed)
Established problem worsened.  Ongoing progressive frailty with non-pathologic anorexia  Liberal diet with nutritional supplement and MVI Anticipate this process with continue.

## 2018-06-29 NOTE — Assessment & Plan Note (Signed)
Established problem that has improved.  Patient tolerating hypnotics regimen Continue mirtazapine 15 mg daily and trazodone 25 mg prn ahs

## 2018-06-29 NOTE — Telephone Encounter (Signed)
Please let [name] know that there is no evidence of left hip fracture.

## 2018-06-29 NOTE — Assessment & Plan Note (Signed)
Wt Readings from Last 3 Encounters:  06/28/18 103 lb 8 oz (46.9 kg)  05/31/18 106 lb (48.1 kg)  04/18/18 109 lb 3.2 oz (49.5 kg)   Ongoing progression of pt's frailty phenotype.   Mirtazapine has not helped with appetite/wt gain.   Will continue it as it is helping with sleep.   Offer whatever food Jordan [name] expresses interest.  Provide nutritional supplement after meals.  Give MVI daily.

## 2018-06-29 NOTE — Telephone Encounter (Signed)
Daughter informed. Gurbani Figge,CMA  

## 2018-06-29 NOTE — Assessment & Plan Note (Signed)
Established problem that has improved slightly with tramadol.  Toleteraing tramadol Moderate to severe impairment from generalized, chronic pain  Plan Titrate off duloxetine over next two weeks.  Continue tramadol 50 mg TID prn, APAP, gabapentin

## 2018-06-29 NOTE — Assessment & Plan Note (Signed)
New problem Further workup planned R/o fracture dislocation with Left Hip 2V XR with pelvic view Rx APAP/tramadol

## 2018-06-29 NOTE — Assessment & Plan Note (Signed)
Established problem Progressive worsening.  Severe stage.  Anticipate ongoing progression in somatic degeneration. Patient has been speaking about her approaching death.   [name] declines hospic for her mother.  [name] believes that patient's Catholic beliefs might interfere with her participation in Hospice.    Will continue to address discomfort. Increase frequency of ov for now in order to address palliative issues.

## 2018-06-29 NOTE — Assessment & Plan Note (Signed)
Established problem worsened.  R/O fracture Vs exac of OA with trauma.  Tramadol/APAP tx

## 2018-06-29 NOTE — Progress Notes (Signed)
Subjective:    Patient ID: Jordan [name], female    DOB: 24-Jul-1919, 85 y.o.   MRN: [id number] ASPASIA RUDE is accompanied by daughter, Iver Nestle Sources of clinical information for visit is/are patient, relative(s), past medical records and 04/10/18 hospital discharge summary. Nursing assessment for this office visit was reviewed with the patient for accuracy and revision.  [name] declined Spanish interpreter services.  [name] acted as the Astronomer.  Previous Report(s) Reviewed: telephone calls Viewed CT AP 04/06/19 to examine left proximal femur Depression screen [facility] 2/9 05/31/2018  Decreased Interest 0  Down, Depressed, Hopeless 0  PHQ - 2 Score 0  Altered sleeping -  Tired, decreased energy -  Change in appetite -  Feeling bad or failure about yourself  -  Trouble concentrating -  Moving slowly or fidgety/restless -  Suicidal thoughts -  PHQ-9 Score -  Some recent data might be hidden   Fall Risk  05/31/2018 04/18/2018 10/02/2017 06/13/2017 04/06/2017  Falls in the past year? 0 0 No Yes No  Number falls in past yr: - - - 2 or more -  Comment - - - - -  Injury with Fall? - - - Yes -  Comment - - - - -  Risk Factor Category  - - - High Fall Risk -  Risk for fall due to : - - - - -  Risk for fall due to: Comment - - - - -  Follow up - - - - -    History/P.E. limitations: dementia and communication barrier Language  Adult vaccines due  Topic Date Due  . TETANUS/TDAP  05/19/2019   There are no preventive care reminders to display for this patient. There are no preventive care reminders to display for this patient.   Chief Complaint  Patient presents with  . Hypertension     HPI  Problem List Items Addressed This Visit    Weight loss, unintentional - No improvement with start mirtazapine 4 wks ago - Conitues to lose weight - Severe protein-caloric malnutrition.    Generalized pain - - Multiple thoracolumbar collapsed vertebra due to osteoporosis ([place]) on Chest CT 2018 -   Lumbar spondylosis -  Pain worse with movement  - Pain in right paraspinal region -  Predominantly tx'd with APAP, Duloxetine, Gabapentin, Lidocaine 5% crm prn - Added tramadol 50 mg every 8 hrs prn last ov 05/31/18: [name] reports some pain relief without excess sedation or confusion.       Medium   HTN (hypertension) (Chronic) Disease Monitoring  Blood pressure range: highly variable.  BP last week in 160s.  Pt getting daily metoprolol XL per dgt jdm.             Leg swelling: no  Chest pain: no   Dyspnea: no     Medication compliance: yes   Medication Side Effects  Lightheadedness: no    Preventitive Healthcare:  Exercise: no   Diet Pattern: poor po intake  Salt Restriction: no  Left hip pain Onset: abt 10 d ago Location: left lateral hip Quality: aching Severity: moderate to severe Function: pain with standing for transfers  Pattern: constant Course: stable Radiation: no Relief: some from tramadol Precipitant: fell onto left hip during unsupervised transfer from chair to walker Associated Symptoms:       Restricted ROM/stiffness/swelling:  stiff       Muscle ache/cramp/spasms: no       Color or temperature change: no  Muscle strength change: no       Change in sensation (dysesthesia/itch or numbness): no Functional Impact:       Change in social or recreation: pain assoicated with further decrease in appetite       ADLs limitations: inhibits transfers       Change in sleep from pain:    Prior Diagnostic Testing or Treatments: Review 04/05/18 CT AP of left prox femur without evidence of acute fx/dislocation Relevant PMH/PSH: Dx OA left hip 06/15/17 at [facility]       Unprioritized   Acute biliary pancreatitis, resolved - Hsopitalization 12/5 - 04/10/18 on [facility] hospitalist service. -  No recurrence of abdominal pain      Insomnia - improved. Now mild since starting Mirtazapine 15 mg qhs Appetite loss - no improvement. Moderate severity  impairment. No improvement with mirtazapine start    SH: Smoking status noted  Review of Systems See hpi    Objective:   Physical Exam  VS reviewed GEN: Alert, Cooperative, Groomed, NAD, socially engaged, sitting in WC HEENT: wearing biaural hearing aids, flaky skin in bil. EAC obstructing TMs. Temporal wasting.  [place] wasting.  COR: RRR, No M/G LUNGS: BCTA, No Acc mm use EXT: No peripheral leg edema.  Severe quad wasting. TTP left grtr troch prominance.  Pain left hip with stand.  No bruising over skin left hip. Severe wasting of overlying soft tissue ofet lateral hip.  (-) pain with l hip int / ext rotation.  Psych: Euthymic, prosodic speech sound, interpreted language relevant to situation and questions    Assessment & Plan:  Visit Problem List with A/P  Visit Problem List with A/P  Frailty Established problem Progressive worsening.  Severe stage.  Anticipate ongoing progression in somatic degeneration. Patient has been speaking about her approaching death.   [name] declines hospic for her mother.  [name] believes that patient's Catholic beliefs might interfere with her participation in Hospice.    Will continue to address discomfort. Increase frequency of ov for now in order to address palliative issues.    Weight loss, unintentional Wt Readings from Last 3 Encounters:  06/28/18 103 lb 8 oz (46.9 kg)  05/31/18 106 lb (48.1 kg)  04/18/18 109 lb 3.2 oz (49.5 kg)   Ongoing progression of pt's frailty phenotype.   Mirtazapine has not helped with appetite/wt gain.   Will continue it as it is helping with sleep.   Offer whatever food Ms Manfredi expresses interest.  Provide nutritional supplement after meals.  Give MVI daily.     Insomnia Established problem that has improved.  Patient tolerating hypnotics regimen Continue mirtazapine 15 mg daily and trazodone 25 mg prn ahs  Generalized pain Established problem that has improved slightly with tramadol.  Toleteraing  tramadol Moderate to severe impairment from generalized, chronic pain  Plan Titrate off duloxetine over next two weeks.  Continue tramadol 50 mg TID prn, APAP, gabapentin  HTN (hypertension) Established problem. Stable. Mild stage [name] dosing metoprolol XL 25 mg daily for now with BPs < 160 response, but will hold on days when morning SBP < 130.    Acute hip pain, left New problem Further workup planned R/o fracture dislocation with Left Hip 2V XR with pelvic view Rx APAP/tramadol  Malnutrition ([place]) Established problem worsened.  Ongoing progressive frailty with non-pathologic anorexia  Liberal diet with nutritional supplement and MVI Anticipate this process with continue.   Paroxysmal atrial fibrillation (H[facility]) Established problem. Stable. Continue current therapy   Bronchiectasis (  [place]) Established problem. Stable. Continue current therapy   Osteoarthritis of left hip Established problem worsened.  R/O fracture Vs exac of OA with trauma.  Tramadol/APAP tx

## 2018-06-29 NOTE — Telephone Encounter (Signed)
Daughter requesting x-ray results from yesterday.

## 2018-07-03 ENCOUNTER — Other Ambulatory Visit: Payer: Self-pay | Admitting: Cardiology

## 2018-07-04 ENCOUNTER — Other Ambulatory Visit: Payer: Self-pay | Admitting: Family Medicine

## 2018-07-04 ENCOUNTER — Telehealth: Payer: Self-pay | Admitting: Family Medicine

## 2018-07-04 NOTE — Telephone Encounter (Signed)
Pt daughter calling, saying pt hasn't had a bowl movement since Saturday. Pt has taken Murelax and stool softeners and neither have worked. Please call daughter back.

## 2018-07-05 NOTE — Telephone Encounter (Signed)
Tramadol likely worsening Jordan [name]'s Constipation.  She will need a colon-motion stimulant and further stool softening to achieve a bowel movement.  Recommend starting with 1. Senokot 2 tablets daily (Over the counter) 2. Increase of Miralax to 1 doses twice a day.  Call if no BM in 3 days.

## 2018-07-06 NOTE — Telephone Encounter (Signed)
Instructions given to daughter.  States that mother had a bowel movement since she last called but will keep this in mind if it happens again.  [name],CMA

## 2018-07-09 ENCOUNTER — Other Ambulatory Visit: Payer: Self-pay | Admitting: Family Medicine

## 2018-07-09 DIAGNOSIS — I480 Paroxysmal atrial fibrillation: Secondary | ICD-10-CM

## 2018-07-09 DIAGNOSIS — K219 Gastro-esophageal reflux disease without esophagitis: Secondary | ICD-10-CM

## 2018-07-09 DIAGNOSIS — K224 Dyskinesia of esophagus: Secondary | ICD-10-CM

## 2018-07-09 DIAGNOSIS — Z7901 Long term (current) use of anticoagulants: Secondary | ICD-10-CM

## 2018-07-09 MED ORDER — DEXLANSOPRAZOLE 30 MG PO CPDR: 30.0000 mg | DELAYED_RELEASE_CAPSULE | Freq: Every day | ORAL | 5 refills | Status: DC

## 2018-07-18 ENCOUNTER — Other Ambulatory Visit: Payer: Self-pay | Admitting: Family Medicine

## 2018-07-18 DIAGNOSIS — G894 Chronic pain syndrome: Secondary | ICD-10-CM

## 2018-07-29 ENCOUNTER — Other Ambulatory Visit: Payer: Self-pay | Admitting: Family Medicine

## 2018-07-29 DIAGNOSIS — F32A Depression, unspecified: Secondary | ICD-10-CM

## 2018-07-29 DIAGNOSIS — M5489 Other dorsalgia: Secondary | ICD-10-CM

## 2018-07-29 DIAGNOSIS — F329 Major depressive disorder, single episode, unspecified: Secondary | ICD-10-CM

## 2018-08-02 ENCOUNTER — Other Ambulatory Visit: Payer: Self-pay | Admitting: Family Medicine

## 2018-08-06 ENCOUNTER — Other Ambulatory Visit: Payer: Self-pay | Admitting: Family Medicine

## 2018-08-06 ENCOUNTER — Other Ambulatory Visit: Payer: Self-pay | Admitting: Cardiology

## 2018-08-06 ENCOUNTER — Other Ambulatory Visit: Payer: Self-pay

## 2018-08-06 DIAGNOSIS — K219 Gastro-esophageal reflux disease without esophagitis: Secondary | ICD-10-CM

## 2018-08-06 MED ORDER — ESOMEPRAZOLE MAGNESIUM 40 MG PO CPDR: 40.0000 mg | DELAYED_RELEASE_CAPSULE | Freq: Every day | ORAL | 3 refills | Status: DC

## 2018-08-06 MED ORDER — OMEPRAZOLE 20 MG PO CPDR: 20.0000 mg | DELAYED_RELEASE_CAPSULE | Freq: Every day | ORAL | 3 refills | Status: DC

## 2018-08-06 NOTE — Telephone Encounter (Signed)
Received fax from pharmacy for a refill on esomeprazole. I did not see this on the med list.

## 2018-08-06 NOTE — Progress Notes (Signed)
Change to omeprazole 20 mg cap from esomeprazole bc of formulary coverage

## 2018-08-09 ENCOUNTER — Telehealth (INDEPENDENT_AMBULATORY_CARE_PROVIDER_SITE_OTHER): Payer: Medicare Other | Admitting: Family Medicine

## 2018-08-09 ENCOUNTER — Other Ambulatory Visit: Payer: Self-pay

## 2018-08-09 ENCOUNTER — Ambulatory Visit: Payer: Medicare Other | Admitting: Family Medicine

## 2018-08-09 ENCOUNTER — Encounter: Payer: Self-pay | Admitting: Family Medicine

## 2018-08-09 DIAGNOSIS — M25472 Effusion, left ankle: Secondary | ICD-10-CM

## 2018-08-09 DIAGNOSIS — M25471 Effusion, right ankle: Secondary | ICD-10-CM

## 2018-08-09 DIAGNOSIS — G4700 Insomnia, unspecified: Secondary | ICD-10-CM

## 2018-08-09 DIAGNOSIS — M1612 Unilateral primary osteoarthritis, left hip: Secondary | ICD-10-CM

## 2018-08-09 DIAGNOSIS — R634 Abnormal weight loss: Secondary | ICD-10-CM

## 2018-08-09 DIAGNOSIS — I10 Essential (primary) hypertension: Secondary | ICD-10-CM

## 2018-08-09 NOTE — Assessment & Plan Note (Signed)
Established problem. Stable. Continue current therapy  

## 2018-08-09 NOTE — Assessment & Plan Note (Signed)
New problem Weight gain of  4- 5 lbs in one week. No SHOB. NO increase in oral intake.   May be related to combination of venous insufficiency, prolonged leg dependence, and HFpEF  Plan Monitor wt and peripheral edema [name] has furosemide 20 mg tablet.  1 tablet has brisk results.  Holding off from using partly bc the significant burden of induced urinary frequency on patient and her caretakers.  [name] to dose furosemide if weight and edema significantly progress.  If [facility] develops, caretakers are to contact our office.

## 2018-08-09 NOTE — Assessment & Plan Note (Signed)
Weight up to 105 from 101 over a week. No significant increase in PO intake + ankle edema increased Suspect weight increase may be fluid.  See ankle edema for details.

## 2018-08-09 NOTE — Assessment & Plan Note (Signed)
Established problem that has improved.  Continue mirtazapine 15 mg qhs and trazodone 50 mg qhs which pt is tolerating

## 2018-08-09 NOTE — Assessment & Plan Note (Signed)
Established problem. Stable. February XR left hip without evidence of fracture or dislocation Continue current therapy prn APAP with Tramadol for breakthru or severe pain

## 2018-08-09 NOTE — Progress Notes (Signed)
[facility] Visit Call [phone] @ 1344.  Patient consented to have visit conducted via telephone.  Encounter participants: Patient: Jordan [name]  Patient Location: Other:  worksite Provider: Sherren Mocha Ilea Hilton  Others (if applicable): Jordan [name] ([name] & primary caretaker/plenary guardian de facto) is the informant for this visit  Chief Complaint: Hypertension  HPI: Overall, Jordan [name] is "Doing better and better" per her dgt.     Weight loss - Weight has increase since 4/1 from 101 lbs to 104.7 lbs today. There has not been a significant increase in her oral intake.  - There is puffiness around her ankles. No redness of legs. - No skin breakdown nor weeping skin. - She is breathing well.  She is able to participate in assisted ADLs at same level as previous.   - [name] has furosemide 20 mg tablets, but has not given them because of how it causes urinary frequency for her mother.  She is willing to administer it if need be.   Insomina - Improved since starting mirtazapine 15 mg qhs and trazodone 50 mg ahs - started waking up a lilltle earlier in the morning.  - Neither pain nor shob seem to be waking her up  Left Hip pain  - Left hip pain following a fall around 06/21/18 - Left hip XR 06/28/18 without fx or dislocation - Intermittent left lateral and posterior pain - no radiation below L knee. - No focal weakness in L leg - No new numbness in L leg - No longer taking duloxetine  CHRONIC HYPERTENSION  Disease Monitoring  Checking Blood pressure at home: yes, running in 120s to 150s 60s - 80s.   Chest pain: no   Dyspnea: no    Medication compliance: yes, metoprolol succinate 25 mg tab; half tab (12.5mg ) daily  Medication Side Effects  Lightheadedness: no   Urinary frequency: no   Edema: yes, ankles puffy (see above)   Palpitations: no    ROS: see hpi No fever No Cough No SHOB  Pertinent PMHx: Pt's son and dgt's primary caretakers  with a hired custodial care when they are at work.   Assessment/Plan:  No problem-specific Assessment & Plan notes found for this encounter.    Time spent on phone with patient: 21 minutes

## 2018-08-21 DIAGNOSIS — I4891 Unspecified atrial fibrillation: Secondary | ICD-10-CM | POA: Diagnosis not present

## 2018-08-21 DIAGNOSIS — Z950 Presence of cardiac pacemaker: Secondary | ICD-10-CM | POA: Diagnosis not present

## 2018-08-21 DIAGNOSIS — Z45018 Encounter for adjustment and management of other part of cardiac pacemaker: Secondary | ICD-10-CM | POA: Diagnosis not present

## 2018-09-03 ENCOUNTER — Other Ambulatory Visit: Payer: Self-pay

## 2018-09-03 ENCOUNTER — Telehealth (INDEPENDENT_AMBULATORY_CARE_PROVIDER_SITE_OTHER): Payer: Medicare Other | Admitting: Family Medicine

## 2018-09-03 DIAGNOSIS — M1A0610 Idiopathic chronic gout, right knee, without tophus (tophi): Secondary | ICD-10-CM

## 2018-09-03 DIAGNOSIS — M25471 Effusion, right ankle: Secondary | ICD-10-CM

## 2018-09-03 DIAGNOSIS — M25472 Effusion, left ankle: Secondary | ICD-10-CM

## 2018-09-03 NOTE — Assessment & Plan Note (Signed)
R knee pain most consistent with gout flare although could have arthritis contributing. Not likely septic joint given lack of fever. No direct trauma to knee, not likely fracture. Location of pain not typical for blood clot although could consider imaging if prednisone not helping, no SOB. Most consistent with gout flare. Given she has noticed a decreased in redness and is only day 2 of Rx, recommended calling back if she doesn't notice a difference in her pain and swelling by days 4-5. Continue to use tramadol, voltaren gel and tylenol prn, holding tramadol for lower BP.

## 2018-09-03 NOTE — Progress Notes (Signed)
[facility] Telemedicine Visit  Patient consented to have virtual visit. Method of visit: Video  Encounter participants: Patient: Jordan [name] - located at home Provider: Rory Percy - located at [facility] Others (if applicable): daughter  Chief Complaint: R knee pain  HPI:  Telephone visit 08/09/18 for ankle edema and L hip pain. Takes prn tylenol with tramadol for breakthrough pain. Not using lasix scheduled due to urinary frequency and burden on caregivers. Instructed to dose lasix based on weight and edema. No SOB.  R knee pain Daughter present and is main historian and interpreter. Patient has been having R knee pain for about the past 2 weeks since she fell on her back when attempting to get up from a chair, daughter does not think her fall caused her knee pain. She has ongoing issues with gout and arthritis for which she takes allopurinol, colcrys, tylenol, tramadol, and voltaren gel.  She normally ambulates with a walker with some help but has not been able to bear much weight on her R leg since her R knee has been hurting. No fevers, rashes. Knee has been swollen, slightly red, and warm to touch. She has been taking tramadol and tylenol for pain, not more than usual. She started taking prednisone 30mg  yesterday and has noticed her knee isn't as red as before but is still painful. Pain is located mostly anterior to the knee but does wrap around. She can actively move her knee but it is painful.  Ankle edema, weight gain She endorses ongoing ankle swelling for which she is taking prn furosemide. She notices a little relief with this. Her weight today is 105.6lbs, down from 107lb. Blood pressures have been ranging 120s-150s SBP. Today BP is 119/84, 117/81.  ROS: per HPI  Pertinent PMHx: HTN, CHF, CAD, GERD, hypothyroidism, alzehimer's dementia, OA, osteoporosis with collapsed vertebra, gout, chronic pain syndrome  Exam:  Gen: sitting in wheelchair, in NAD Knee:  slightly more swollen R knee, painful AROM. No appreciable rashes or redness.  Assessment/Plan:  Idiopathic gout R knee pain most consistent with gout flare although could have arthritis contributing. Not likely septic joint given lack of fever. No direct trauma to knee, not likely fracture. Location of pain not typical for blood clot although could consider imaging if prednisone not helping, no SOB. Most consistent with gout flare. Given she has noticed a decreased in redness and is only day 2 of Rx, recommended calling back if she doesn't notice a difference in her pain and swelling by days 4-5. Continue to use tramadol, voltaren gel and tylenol prn, holding tramadol for lower BP.  Ankle edema, bilateral Persistent but responds to lasix. No current signs of clot. Continue to use prn, hold for SBP<120. F/u with Dr. McDiarmid as directed.   Time spent during visit with patient: 17 minutes

## 2018-09-03 NOTE — Assessment & Plan Note (Signed)
Persistent but responds to lasix. No current signs of clot. Continue to use prn, hold for SBP<120. F/u with Dr. McDiarmid as directed.

## 2018-09-06 ENCOUNTER — Other Ambulatory Visit: Payer: Self-pay

## 2018-09-06 ENCOUNTER — Telehealth: Payer: Self-pay | Admitting: Family Medicine

## 2018-09-06 ENCOUNTER — Other Ambulatory Visit: Payer: Self-pay | Admitting: Family Medicine

## 2018-09-06 ENCOUNTER — Ambulatory Visit (INDEPENDENT_AMBULATORY_CARE_PROVIDER_SITE_OTHER): Payer: Medicare Other | Admitting: Cardiology

## 2018-09-06 ENCOUNTER — Encounter: Payer: Self-pay | Admitting: Cardiology

## 2018-09-06 VITALS — BP 129/52 | HR 68 | Ht <= 58 in | Wt 105.0 lb

## 2018-09-06 DIAGNOSIS — Z950 Presence of cardiac pacemaker: Secondary | ICD-10-CM

## 2018-09-06 DIAGNOSIS — M170 Bilateral primary osteoarthritis of knee: Secondary | ICD-10-CM

## 2018-09-06 DIAGNOSIS — I442 Atrioventricular block, complete: Secondary | ICD-10-CM | POA: Diagnosis not present

## 2018-09-06 DIAGNOSIS — I480 Paroxysmal atrial fibrillation: Secondary | ICD-10-CM

## 2018-09-06 DIAGNOSIS — R600 Localized edema: Secondary | ICD-10-CM | POA: Diagnosis not present

## 2018-09-06 DIAGNOSIS — M1612 Unilateral primary osteoarthritis, left hip: Secondary | ICD-10-CM

## 2018-09-06 DIAGNOSIS — M159 Polyosteoarthritis, unspecified: Secondary | ICD-10-CM

## 2018-09-06 NOTE — Telephone Encounter (Signed)
Will forward to Dr. Ky Barban for this concern.  [name],CMA

## 2018-09-06 NOTE — Telephone Encounter (Signed)
Pt's daughter called regarding virtual visit her mom had on 05-04, her mom took her last predizone today, and wanted to see if she could receive a refill. Please give pt's daughter a call back.

## 2018-09-06 NOTE — Progress Notes (Signed)
Virtual Visit via Video Note: This visit type was conducted due to national recommendations for restrictions regarding the COVID-19 Pandemic (e.g. social distancing).  This format is felt to be most appropriate for this patient at this time.  All issues noted in this document were discussed and addressed.  No physical exam was performed (except for noted visual exam findings with Telehealth visits).  The patient has consented to conduct a Telehealth visit and understands insurance will be billed.   I connected with@, on 09/06/18 at  by a video enabled telemedicine application and verified that I am speaking with the correct person using two identifiers.   I discussed the limitations of evaluation and management by telemedicine and the availability of in person appointments. The patient expressed understanding and agreed to proceed.   I have discussed with patient regarding the safety during COVID Pandemic and steps and precautions to be taken including social distancing, frequent hand wash and use of detergent soap, gels with the patient. I asked the patient to avoid touching mouth, nose, eyes, ears with the hands. I encouraged regular walking around the neighborhood and exercise and regular diet, as long as social distancing can be maintained.  Primary Physician/Referring:  McDiarmid, Blane Ohara, MD  Patient ID: Jordan [name], female    DOB: 07/14/1919, 85 y.o.   MRN: [id number]  Chief Complaint  Patient presents with  . Congestive Heart Failure  . Hypertension  . Follow-up  . Knee Pain    HPI: Jordan [name]  is a 85 y.o. female  with h/o complete heart block, sick sinus syndrome S/P pacemaker implantation with Medtronic pacemaker on 09/24/2013, pacemaker dependant, PAF with prolonged sustained atrial fibrillation with RVR on 09/16/2015 for 2 hours and 32 minutes and no further recurrence and on anticoagulation with Eliquis.  She underwent esophageal dilatation in July 2017 by Dr. Lizbeth Bark and has  not had difficulty in swallowing since. Her past medical history also includes recurrent UTI, hyperlipidemia and hypertension.  Patient's daughter called and made an appointment due to leg edema and worry about CHF.  Symptoms started 2 weeks ago with severe pain, in bilateral knee, right is worse.  About 5 days ago she was started on steroid Dosepak and has noticed improvement in pain and also swelling in her knee.  But has developed mild leg edema in bilateral ankles.  Otherwise no change in symptoms of chronic fatigue, denies PND or orthopnea, no chest pain or palpitations.  She has not had any bleeding diathesis on Eliquis.  Past Medical History:  Diagnosis Date  . Acute hip pain, left 06/29/2018  . Acute pancreatitis 04/05/2018  . AKI (acute kidney injury) ([place])   . Alzheimer's dementia [facility]) 07/30/2013   04/17/14 MoCA - Blind (Spanish version); 9 out of 22.  Correct MoCA = 12 out of 30.    . Arthritis   . Atherosclerosis of aorta ([place]) 10/05/2015  . Back pain 10/05/2015  . Bronchiectasis ([place]) 10/03/2017  . Calcification of native coronary artery 10/05/2015   Chest CT 2011.   . Carpal tunnel syndrome 06/29/2006   Qualifier: Diagnosis of  By: Benna Dunks    . Cervicalgia   . Cervicalgia 05/18/2009   Qualifier: Diagnosis of  By: Walker Kehr MD, Patrick Jupiter    . CHF (congestive heart failure) ([place])   . Chronic bilateral thoracic back pain 10/05/2015  . Chronic constipation   . Chronic diastolic heart failure ([place]) 09/01/2013  . Chronic pain syndrome 05/09/2014  . CKD (chronic  kidney disease) stage 3, GFR 30-59 ml/min (HCC) 09/03/2013  . Combined visual hearing impairment 11/12/2013  . Complete heart block ([place])   . Constipation   . Cryptogenic stroke ([place]) S/P  LOOP RECORDER IMPLANT   DX  01/2013  --  SYNCOPAL EPISODE --  EPISODIC  ATRIAL TACHY/ FIBULATION  AND PAUSES  . CYSTOCELE/RECTOCELE/PROLAPSE,UNSPEC. 06/29/2006   Qualifier: Diagnosis of  By: Benna Dunks    . Depressive disorder 06/29/2006       .  Dilation of esophagus 06/16/2017   Barium Esophagram 06/25/17: Poor esophageal motility. Dilated esophagus with tertiary contractions.  Smooth narrowing distal esophagus similar to the prior study.  Moderate narrowing. Barium tablet passed through this area after several sips of water.  Marland Kitchen Displaced fracture of fifth metatarsal bone, right foot, subsequent encounter for fracture with routine healing 06/05/2017  . Diverticulosis of colon   . DIVERTICULOSIS OF COLON 06/29/2006   Qualifier: Diagnosis of  By: Benna Dunks    . Dry eyes   . Dysphagia 09/15/2015  . Fall from bed 03/17/2016  . Fatigue   . Food impaction of esophagus   . Frequency of urination   . Gait instability   . Generalized osteoarthritis of multiple sites 06/15/2017  . Glaucoma 04/18/2014  . Glaucoma of both eyes   . Gout 09/21/2013  . Hand Heaviness 03/17/2016  . Headache   . Hearing loss of aging 05/09/2014  . Hearing loss of both ears    no hearing aids  . HERNIA, INCISIONAL VENTRAL W/O OBST/GNGR 10/25/2006   Has been seen by surgery in the past and told that this is not dangerous and should only be treated particularly bothersome    . History of acute gouty arthritis 09/21/2013  . History of colon polyps   . History of Food impaction of esophagus   . HTN (hypertension) 06/29/2006   Isolated Systolic Hypertension - Ambulatory BP  Monitoring result 4/28-29/2015   . Hyperlipidemia 02/27/2015  . Hyperplastic colon polyp 2006   colonoscope  . Hypertension   . Hypochloremia   . Hyponatremia   . Hypothyroidism   . Impaired mobility and ADLs 05/30/2014  . Influenza A 05/14/2016  . Insomnia   . INTERSTITIAL CYSTITIS 09/21/2009   Qualifier: Diagnosis of  By: Sarita Haver  MD, Coralyn Mark    . Loss of weight 04/05/2010   Qualifier: Diagnosis of  By: Walker Kehr MD, Patrick Jupiter    . Lumbar stenosis   . Macular degeneration, age related, nonexudative 04/18/2014  . MASS, LUNG 04/05/2010   Qualifier: Diagnosis of  By: Walker Kehr MD, Patrick Jupiter    . Mobitz type 2  second degree heart block 08/29/2013  . Moderate dementia ([place])   . Narrowing of Distal Esophagus 06/16/2017   Barium Esophagram (06/15/17) Poor esophageal motility. Dilated esophagus with tertiary contractions.  Smooth narrowing distal esophagus similar to the prior study.  Moderate narrowing. Barium tablet passed through this area after several sips of water.   . OA (osteoarthritis)    RIGHT SHOULDER AC JOINT  . Orthostasis 04/28/2016  . OSTEOARTHRITIS, MULTI SITES 06/29/2006   Qualifier: Diagnosis of  By: Benna Dunks    . Osteoporosis   . OSTEOPOROSIS 03/20/2008   Qualifier: Diagnosis of  By: Jerline Pain MD, Anderson Malta    . Pacemaker   . 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 Peripheral  neuropathy   . PERIPHERAL NEUROPATHY 03/29/2010   Qualifier: Diagnosis of  By: Walker Kehr MD, Patrick Jupiter    . Persistent headaches 09/06/2013  . Polymyalgia rheumatica ([place]) 11/08/2013  . Presbyesophagus (senile esophageal dysmotility) 06/16/2017  . Presence of permanent cardiac pacemaker   . Rib pain on left side 10/05/2015  . Right knee pain 01/15/2014  . Right rotator cuff tear   . Rotator cuff tear arthropathy 09/11/2012   Likely related to rotator cuff tendinopathy seen on musculoskeletal ultrasound, and GH DJD.  Complete tear of right supra and infraspinatus on MRI 10/2012     . S/P shoulder replacement 03/20/2015  . Shortness of breath 07/21/2014  . Shortness of breath dyspnea   . Spinal stenosis of lumbar region 10/03/2012   Patient has significant low back pain related to spinal stenosis and DJD, DDD.  We had a long discussion about treatment options which are quite limited. She is likely not a good surgical candidate for laminectomy. I am not sure about the usefulness of epidural steroid injection. We'll plan on restarting meloxicam after  a long discussion of the pros and cons and risks of chronic kidney disease.  The patient and her family feel that the benefit of good pain control is the small risk.  Additionally she will followup with her primary care provider for creatinine monitoring.  Will obtain physical therapy for TENS unit trial.  Will refer to pain management for consideration of epidural steroid injection as that worked well in the past for cervical pain Followup here as needed   . SUI (stress urinary incontinence, female)   . Syncope and collapse 02/09/2013  . Urinary frequency 06/05/2015  . Urinary incontinence, mixed 06/29/2006   Qualifier: Diagnosis of  By: Benna Dunks    . UTI (urinary tract infection) 05/27/2016  . UTI (urinary tract infection) 05/2017  . Vertigo 10/18/2010  . Weakness 08/29/2013    Past Surgical History:  Procedure Laterality Date  . CARPAL TUNNEL RELEASE Right   . ESOPHAGOGASTRODUODENOSCOPY N/A 09/15/2015   Procedure: ESOPHAGOGASTRODUODENOSCOPY (EGD);  Surgeon: Clarene Essex, MD;  Location: [facility] ENDOSCOPY;  Service: Endoscopy;  Laterality: N/A;  . EYE SURGERY     cataract, bilat.  Randolm Idol / REPLACE / REMOVE PACEMAKER    . LOOP RECORDER IMPLANT  02-08-13; 08-30-13   MDT LinQ implanted by Dr Lovena Le for syncope, explanted 08-30-13 after CHB identified  . LOOP RECORDER IMPLANT N/A 02/11/2013   Procedure: LOOP RECORDER IMPLANT;  Surgeon: Evans Lance, MD;  Location: [facility] CATH LAB;  Service: Cardiovascular;  Laterality: N/A;  . PACEMAKER INSERTION  08-30-2013   MDT [id number] pacemaker implanted by Dr Rayann Heman for CHB  . PERMANENT PACEMAKER INSERTION N/A 08/30/2013   Procedure: PERMANENT PACEMAKER INSERTION;  Surgeon: Coralyn Mark, MD;  Location: [facility] CATH LAB;  Service: Cardiovascular;  Laterality: N/A;  . REVERSE SHOULDER ARTHROPLASTY Right 03/20/2015   Procedure: RIGHT REVERSE SHOULDER ARTHROPLASTY;  Surgeon: Netta Cedars, MD;  Location: [facility];  Service: Orthopedics;  Laterality: Right;  . TOTAL ABDOMINAL  HYSTERECTOMY W/ BILATERAL SALPINGOOPHORECTOMY  03-15-2005    Social History   Socioeconomic History  . Marital status: Widowed    Spouse name: Not on file  . Number of children: 3  . Years of education: Not on file  . Highest education level: Not on file  Occupational History  . Not on file  Social Needs  . Financial resource strain: Not on file  . Food insecurity:    Worry: Not on file  Inability: Not on file  . Transportation needs:    Medical: Not on file    Non-medical: Not on file  Tobacco Use  . Smoking status: Never Smoker  . Smokeless tobacco: Never Used  Substance and Sexual Activity  . Alcohol use: No    Alcohol/week: 0.0 standard drinks  . Drug use: No  . Sexual activity: Never  Lifestyle  . Physical activity:    Days per week: Not on file    Minutes per session: Not on file  . Stress: Not on file  Relationships  . Social connections:    Talks on phone: Not on file    Gets together: Not on file    Attends religious service: Not on file    Active member of club or organization: Not on file    Attends meetings of clubs or organizations: Not on file    Relationship status: Not on file  . Intimate partner violence:    Fear of current or ex partner: Not on file    Emotionally abused: Not on file    Physically abused: Not on file    Forced sexual activity: Not on file  Other Topics Concern  . Not on file  Social History Narrative   ** Merged History Encounter **       lives with son, [place]; No tob, etoh.  Has three total children. 1 g'child.;  Dtr [name] (w) [phone], (h) [phone]; dtr [place]. Daughters usually interpret for her at visits and are very involved and ask lots of questions.     Current Outpatient Medications on File Prior to Visit  Medication Sig Dispense Refill  . acetaminophen (TYLENOL) 650 MG CR tablet Take 650 mg by mouth 2 (two) times daily as needed for pain.    Marland Kitchen allopurinol (ZYLOPRIM) 100 MG tablet Take 1 tablet (100 mg  total) by mouth daily. 90 tablet 3  . apixaban (ELIQUIS) 2.5 MG TABS tablet Take 2.5 mg by mouth 2 (two) times daily.    . beta carotene w/minerals (OCUVITE) tablet Take 1 tablet by mouth daily. 90 tablet 3  . COLCRYS 0.6 MG tablet TAKE 1 TABLET BY MOUTH TWICE DAILY 240 tablet 0  . cycloSPORINE (RESTASIS) 0.05 % ophthalmic emulsion Place 1 drop into both eyes at bedtime.    Marland Kitchen Dexlansoprazole 30 MG capsule Take 1 capsule (30 mg total) by mouth daily. 30 capsule 5  . diclofenac sodium (VOLTAREN) 1 % GEL Apply 2 g topically 4 (four) times daily as needed (pain). 100 g PRN  . docusate sodium (COLACE) 100 MG capsule Take 2 capsules (200 mg total) by mouth 2 (two) times daily. 30 capsule 0  . furosemide (LASIX) 20 MG tablet TAKE 1 TABLET BY MOUTH DAILY AS NEEDED FOR SWELLING 90 tablet 0  . gabapentin (NEURONTIN) 100 MG capsule TAKE 2 CAPSULES BY MOUTH TWICE DAILY 360 capsule 0  . levothyroxine (SYNTHROID, LEVOTHROID) 100 MCG tablet TAKE 1 TABLET BY MOUTH ONCE DAILY 90 tablet PRN  . Lidocaine 0.5 % GEL Apply 1 application topically daily as needed (back pain). 170 g 5  . metoprolol succinate (TOPROL-XL) 25 MG 24 hr tablet Take 1 tablet (25 mg total) by mouth daily. (Patient taking differently: Take 12.5 mg by mouth daily. ) 60 tablet 0  . mirtazapine (REMERON) 7.5 MG tablet Take 2 tablets (15 mg total) by mouth at bedtime. 60 tablet PRN  . omeprazole (PRILOSEC) 20 MG capsule Take 1 capsule (20 mg total) by mouth daily. [place]  capsule 3  . ondansetron (ZOFRAN) 4 MG tablet Take 1 tablet (4 mg total) by mouth daily as needed for nausea or vomiting. 30 tablet 1  . polyethylene glycol (MIRALAX / GLYCOLAX) packet take 17 grams by mouth once daily (Patient taking differently: daily as needed. take 17 grams by mouth once daily) 100 packet PRN  . TOVIAZ 4 MG TB24 tablet TAKE 1 TABLET BY MOUTH ONCE DAILY 30 tablet PRN  . traMADol (ULTRAM) 50 MG tablet Take 0.5-1 tablets (25-50 mg total) by mouth every 8 (eight) hours  as needed for moderate pain. 90 tablet 2  . TRAVATAN Z 0.004 % SOLN ophthalmic solution Place 1 drop into both eyes at bedtime.     . traZODone (DESYREL) 50 MG tablet TAKE 1 TABLET BY MOUTH AT BEDTIME (Patient taking differently: 25 mg. ) 90 tablet 1  . vitamin B-12 (CYANOCOBALAMIN) 1000 MCG tablet Take 1,000 mcg by mouth daily.     No current facility-administered medications on file prior to visit.     Review of Systems  Constitution: Positive for malaise/fatigue (chronic and no change). Negative for chills, decreased appetite and weight gain.  Cardiovascular: Positive for leg swelling. Negative for dyspnea on exertion and syncope.  Respiratory: Negative for hemoptysis, sleep disturbances due to breathing and wheezing.   Endocrine: Negative for cold intolerance.  Hematologic/Lymphatic: Does not bruise/bleed easily.  Musculoskeletal: Positive for arthritis, back pain (chronic) and joint swelling (bilateral knee).  Gastrointestinal: Negative for abdominal pain, anorexia and change in bowel habit.  Neurological: Negative for headaches and light-headedness.  Psychiatric/Behavioral: Negative for depression and substance abuse.  All other systems reviewed and are negative.     Objective  Blood pressure (!) 129/52, pulse 68, height 4\' 5"  (1.346 m), weight 105 lb (47.6 kg). Body mass index is 26.28 kg/m.   Physical exam not performed or limited due to virtual visit. Please see exam details from prior visit is as below. Overall she did not appear to be in acute distress.  Significant swelling was noted in the right knee compared to the left, trace ankle edema.  Conjunctiva was clear, no JVD, respiration was not labored.  Physical Exam  Constitutional: She appears well-nourished. No distress.  Short staure  HENT:  Head: Atraumatic.  Eyes: Conjunctivae are normal.  Neck: Neck supple. No JVD present. No thyromegaly present.  Cardiovascular: Normal rate, regular rhythm, normal heart sounds and  intact distal pulses. Exam reveals no gallop.  No murmur heard. Pulmonary/Chest: Effort normal. She has rales (right base).  Pacemaker pocket in the left infraclavicular region  Abdominal: Soft. Bowel sounds are normal.  Musculoskeletal: Normal range of motion.        General: No edema.  Neurological: She is alert.  Skin: Skin is warm and dry.  Psychiatric: She has a normal mood and affect.   Radiology: No results found.  Laboratory examination:    CMP Latest Ref Rng & Units 04/18/2018 04/09/2018 04/08/2018  Glucose 65 - 99 mg/dL 129(H) 61(L) 68(L)  BUN 10 - 36 mg/dL 14 18 16   Creatinine 0.57 - 1.00 mg/dL 1.38(H) 1.14(H) 0.86  Sodium 134 - 144 mmol/L 136 134(L) 137  Potassium 3.5 - 5.2 mmol/L 4.7 4.3 3.9  Chloride 96 - 106 mmol/L 98 98 103  CO2 20 - 29 mmol/L 27 26 28   Calcium 8.7 - 10.3 mg/dL 8.9 8.5(L) 8.5(L)  Total Protein 6.0 - 8.5 g/dL 5.6(L) 5.0(L) 4.9(L)  Total Bilirubin 0.0 - 1.2 mg/dL 0.4 1.2 0.9  Alkaline Phos  39 - 117 IU/L 160(H) 164(H) 168(H)  AST 0 - 40 IU/L 25 28 39  ALT 0 - 32 IU/L 15 42 53(H)   CBC Latest Ref Rng & Units 04/18/2018 04/09/2018 04/07/2018  WBC 3.4 - 10.8 x10E3/uL 4.8 5.7 8.4  Hemoglobin 11.1 - 15.9 g/dL 11.2 10.9(L) 9.3(L)  Hematocrit 34.0 - 46.6 % 33.9(L) 34.6(L) 30.0(L)  Platelets 150 - 450 x10E3/uL 222 141(L) 103(L)   Lipid Panel     Component Value Date/Time   CHOL 217 (H) 08/11/2016 1239   TRIG 209 (H) 08/11/2016 1239   HDL 60 08/11/2016 1239   CHOLHDL 3.6 08/11/2016 1239   CHOLHDL 3.5 02/04/2016 1218   VLDL 41 (H) 02/04/2016 1218   LDLCALC 115 (H) 08/11/2016 1239   HEMOGLOBIN A1C Lab Results  Component Value Date   HGBA1C  07/01/2010    5.6 (NOTE)                                                                       According to the ADA Clinical Practice Recommendations for 2011, when HbA1c is used as a screening test:   >=6.5%   Diagnostic of Diabetes Mellitus           (if abnormal result  is confirmed)  5.7-6.4%   Increased  risk of developing Diabetes Mellitus  References:Diagnosis and Classification of Diabetes Mellitus,Diabetes ZOXW,9604,54(UJWJX 1):S62-S69 and Standards of Medical Care in         Diabetes - 2011,Diabetes Care,2011,34  (Suppl 1):S11-S61.   MPG 114 07/01/2010   TSH No results for input(s): TSH in the last 8760 hours.  Cardiac Studies:   Echocardiogram [07/29/2014]: Normal LV systolic function LVEF 91-47%. Grade 1 diastolic dysfunction, mitral and a calcification.  Lower Extremity bilateral venous Dopplers [08/30/2013]:  - No evidence of deep vein thrombosis involving the left lower extremity. - No evidence of deep vein thrombosis involving the right common femoral vein.  Assessment   Bilateral leg edema  Primary osteoarthritis of both knees  Paroxysmal atrial fibrillation (HCC) CHA2DS2-VASc Score is 6 with yearly risk of stroke of 9.8 % - Remot pacemaker transmission 10/05/15  atrial fibrillation on 09/16/2015 for 2 hours and 32 minutes at 4:15 the morning.   Complete heart block ([place]) S/P permanent pacemaker 2015  Dual chamber PPM-Medtronic Adapta L ADDR 08/30/2013 for complete heart block  EKG 08/16/2016: AV paced rhythm.  Ventricular rate 70 bpm.  No further analysis due to paced rhythm. No significant change from EKG 10/60/2017.  Remote pacemaker transmission 4.21.20: Battery longevity is 5.5 years. RA pacing is 5.6 %, RV pacing is 100 %.   Recommendations:   Patient's daughter made the appointment for evaluation of worsening leg edema that started about 2 weeks ago.  On virtual visit, evaluation of the patient on the video reveals most of the edema is located in the bilateral knee, right is worse than the left, very trace ankle edema.  This is due to primary osteoarthritis with significant inflammation involving the right knee worse than the left.  She is presently on prednisone, symptoms are improved.  If symptoms deteriorate, she may need probably aspiration of the right  knee along with intra-articular injection of steroids.  Findings are not consistent with congestive heart  failure.  She has no JVD, no worsening leg edema, no change in weight.  She appears well and in no acute distress.  I have reassured the patient and her daughter.  With regard to pacemaker evaluation, it has been normally functioning, she is scheduled for annual clinic checkup but due to Covid 19, I'll schedule this in the next 2-3 months Along with office visit with me.  She'll continue more transmissions.  She is presently on anticoagulation with Eliquis without bleeding diathesis for atrial fibrillation.  Continue the same.  Adrian Prows, MD, [facility] 09/06/2018, 8:53 PM [facility] [place] Pager: [phone] Office: [phone] If no answer Cell [phone]

## 2018-09-10 ENCOUNTER — Other Ambulatory Visit: Payer: Self-pay | Admitting: Family Medicine

## 2018-09-10 MED ORDER — PREDNISONE 10 MG PO TABS: 30.0000 mg | ORAL_TABLET | Freq: Every day | ORAL | 0 refills | Status: DC

## 2018-09-10 NOTE — Telephone Encounter (Signed)
Still having significant pain. Redness, swelling, and warmth improved. Still no fevers. Sent refill to pharmacy. Informed daughter if patient continues to have significant pain after second burst, would likely need to be reevaluated at that point.

## 2018-09-13 ENCOUNTER — Telehealth (INDEPENDENT_AMBULATORY_CARE_PROVIDER_SITE_OTHER): Payer: Medicare Other | Admitting: Family Medicine

## 2018-09-13 ENCOUNTER — Other Ambulatory Visit: Payer: Self-pay

## 2018-09-13 DIAGNOSIS — M25472 Effusion, left ankle: Secondary | ICD-10-CM

## 2018-09-13 DIAGNOSIS — M10061 Idiopathic gout, right knee: Secondary | ICD-10-CM

## 2018-09-13 DIAGNOSIS — M25471 Effusion, right ankle: Secondary | ICD-10-CM | POA: Diagnosis not present

## 2018-09-13 DIAGNOSIS — R609 Edema, unspecified: Secondary | ICD-10-CM

## 2018-09-13 DIAGNOSIS — I10 Essential (primary) hypertension: Secondary | ICD-10-CM

## 2018-09-13 DIAGNOSIS — Z8739 Personal history of other diseases of the musculoskeletal system and connective tissue: Secondary | ICD-10-CM

## 2018-09-13 DIAGNOSIS — R54 Age-related physical debility: Secondary | ICD-10-CM

## 2018-09-13 DIAGNOSIS — R630 Anorexia: Secondary | ICD-10-CM

## 2018-09-13 DIAGNOSIS — E778 Other disorders of glycoprotein metabolism: Secondary | ICD-10-CM

## 2018-09-13 DIAGNOSIS — E440 Moderate protein-calorie malnutrition: Secondary | ICD-10-CM

## 2018-09-13 DIAGNOSIS — R52 Pain, unspecified: Secondary | ICD-10-CM

## 2018-09-14 DIAGNOSIS — R630 Anorexia: Secondary | ICD-10-CM

## 2018-09-14 DIAGNOSIS — E778 Other disorders of glycoprotein metabolism: Secondary | ICD-10-CM | POA: Insufficient documentation

## 2018-09-14 DIAGNOSIS — M109 Gout, unspecified: Secondary | ICD-10-CM

## 2018-09-14 DIAGNOSIS — R609 Edema, unspecified: Secondary | ICD-10-CM | POA: Insufficient documentation

## 2018-09-14 HISTORY — DX: Other disorders of glycoprotein metabolism: E77.8

## 2018-09-14 HISTORY — DX: Gout, unspecified: M10.9

## 2018-09-14 HISTORY — DX: Anorexia: R63.0

## 2018-09-14 NOTE — Progress Notes (Signed)
[place] / E- Visit   This visit type was conducted due to national recommendations for restrictions regarding the COVID-19 Pandemic (e.g. social distancing) in an effort to limit this patient's exposure and mitigate transmission in our community.  Due to her co-morbid illnesses, this patient is at least at moderate risk for complications without adequate follow up.  This format is felt to be most appropriate for this patient at this time.  All issues noted in this document were discussed and addressed.  A limited physical exam was performed with this format.   Patient's dgt/hcpoa consented to have visit conducted via telephone  Encounter participants: Patient: Jordan [name]  Patient Location: Home Provider: Sherren Mocha Maygen Sirico at office  Others (if applicable): [name], [place] Sizer  Chief Complaint: ankle swelling  HPI:  Bilateral Ankle Edema            - persistent since c/o 08/09/18. Poor response to Lasix 20 mg doses.  Ms Valencia cannot tolerate putting on compression stockings.             - HFpEF                       - weight stable                       - no increase SHOB/cough/wheeze            - hypoproteinemia                       - Renal disease, loss of appetite, malnutrition                                - CKD IV            - Recent systemic corticosteroid therapy for acute knee gout flare            - Leg dependence - patient spends most of day in [facility] without foot rests.   Hypertension          - HOme BP in 150 range in mornings with lower ones after 12.5 mg metoprolol around 11 am - noon.           - No complaint of chest pain, lightheadedness  Frailty        - Weight loss        - Malnutrition                 - Hypoproteinemia        - Dementia  Generalized pain        - Chronic Pain Syndrome        - occasional tramadol.  No use of available oxycodone   ROS: see hpi No fever No cough  Pertinent PMHx: Pt's son and dgt's primary  caretakers with a hired custodial care when they are at work.    Exam:  [name] assisting with exam Gen: NAD, groomed, in [facility],  Respiratory: speaking in full sentence, no audible wheeze, no visible incr WOB Legs: mild edema and minimal to 1+ pitting at medial ankle left, trace ankle on right.  No erythema, no skin breakdown. Nontender to palpation  Assessment/Plan:  No problem-specific Assessment & Plan notes found for this encounter.    Time spent on phone with patient: 32 minutes

## 2018-09-14 NOTE — Assessment & Plan Note (Signed)
Established problem Controlled Continue current therapy regiment with change of dosing metoprolol 12.5 mg to nighttime to avoid morning high SBP and hopefully promote nighttime dipping.

## 2018-09-14 NOTE — Assessment & Plan Note (Signed)
Established problem MUltifactoria in origin Recommend ACE wrap on feet/ankle in morning for mild compression therapy Will see if able to increase serum protein with protein supplement to meals.

## 2018-09-25 ENCOUNTER — Telehealth: Payer: Self-pay | Admitting: *Deleted

## 2018-09-25 NOTE — Telephone Encounter (Signed)
-----   Message from [name], RD sent at 09/17/2018  7:03 PM EDT ----- [name], they can get liquid pro-stat in either 30-oz bottles (no refrigeration required) or in the (more expensive) 1-oz packets from [email or web address]  It can be added to any water or juice (NOT milk).  Each 1 oz (2 tbsp) = 15 g protein and 10 g carb.  These are often given in the hospital as is - consistency of honey, so easy to take off a spoon.  [place] used to carry Pro-stat liquid, but I haven't checked in quite a while.  I don't know about powdered Pro-stat, but there are several easy-to-find protein powders available in local stores.  I would encourage use of the Pro-stat liquid.  If you have Qs, feel free to call my cell: [phone].  Or her daughter can call me at my work #:  [phone], and I'll return her call.   [name] ----- Message ----- From: [name], CMA Sent: 09/14/2018   3:05 PM EDT To: [name], RD   ----- Message ----- From: McDiarmid, Blane Ohara, MD Sent: 09/14/2018   2:34 PM EDT To: [name]  [name],  This 85 year-old woman has bilateral ankle edema, partly from her low serum albumin / protein levels.    Her appetite is poor per the daughter, so increasing the amount of food is problematic.  I suggested to the daughter to sprinkle the same protein supplement used in the hospital, Pro-Stat. I  could not find this powder available retail.  Do you know how we might increase the protein content of her meals without increasing meal volume?  Is there an Pro-Stat equivalent protein powder available retail?   Thank you for your advice,  Barkley Boards. She is currently taking some Ensure shake-type supplement after meals.

## 2018-09-25 NOTE — Telephone Encounter (Signed)
Daughter informed. [name],CMA  

## 2018-10-08 DIAGNOSIS — H04123 Dry eye syndrome of bilateral lacrimal glands: Secondary | ICD-10-CM | POA: Diagnosis not present

## 2018-10-08 DIAGNOSIS — H401132 Primary open-angle glaucoma, bilateral, moderate stage: Secondary | ICD-10-CM | POA: Diagnosis not present

## 2018-10-08 DIAGNOSIS — H353132 Nonexudative age-related macular degeneration, bilateral, intermediate dry stage: Secondary | ICD-10-CM | POA: Diagnosis not present

## 2018-10-13 IMAGING — RF DG ESOPHAGUS
1 series · 13 of 13 positions shown · non-contrast
Comparison: Esophogram 11/10/2015

CLINICAL DATA: Dysphagia. Weight loss, unintentional. History of
esophageal food impaction

EXAM:
ESOPHOGRAM/BARIUM SWALLOW
TECHNIQUE: Single contrast examination was performed using  thin barium.
FLUOROSCOPY TIME:  Fluoroscopy Time:  1 minute 18 seconds
Radiation Exposure Index (if provided by the fluoroscopic device):
Number of Acquired Spot Images: 0

[Series 1: one shot · 13 of 13 slices shown]
[im 1/13]
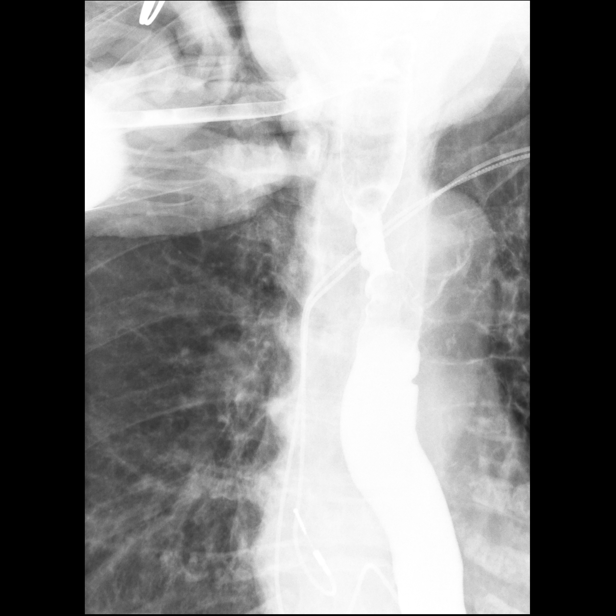
[im 2/13]
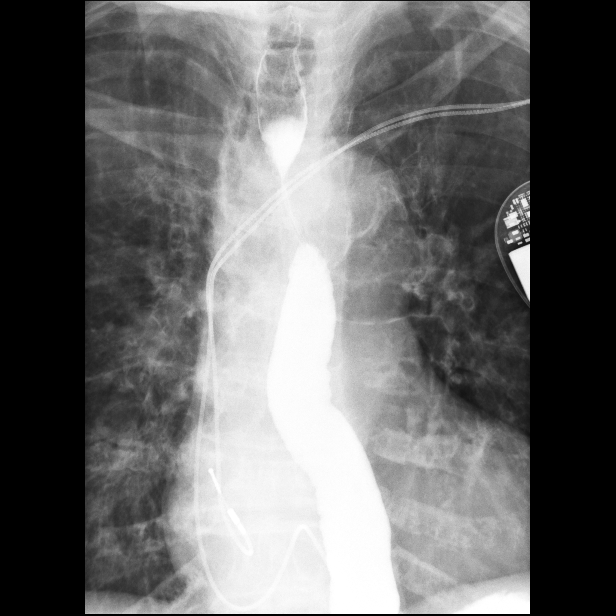
[im 3/13]
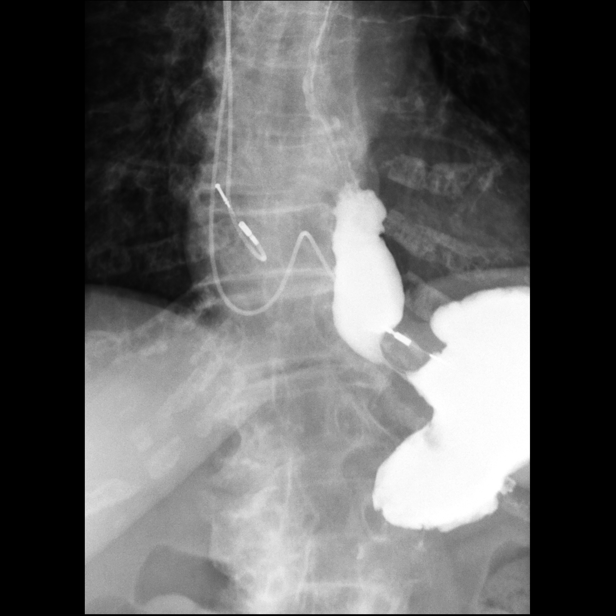
[im 4/13]
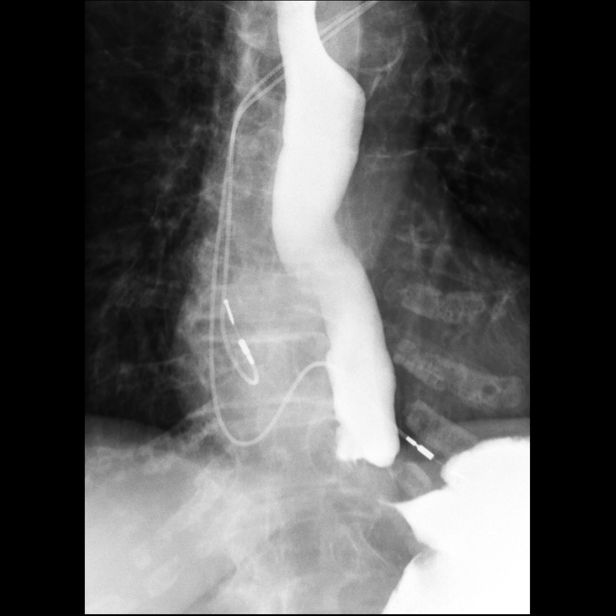
[im 5/13]
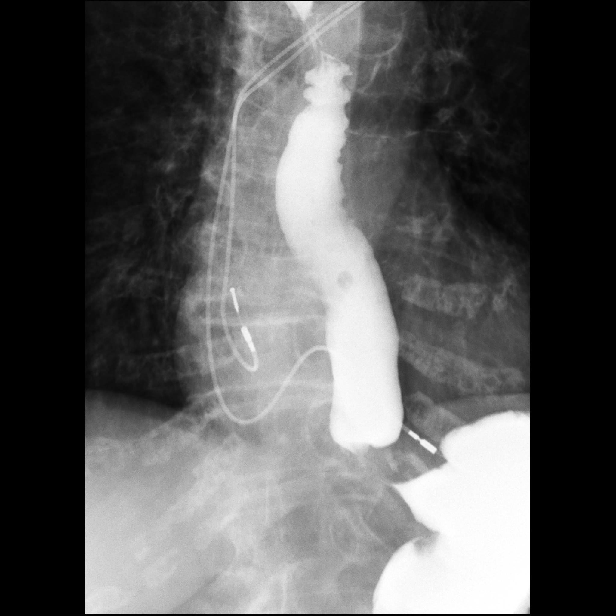
[im 6/13]
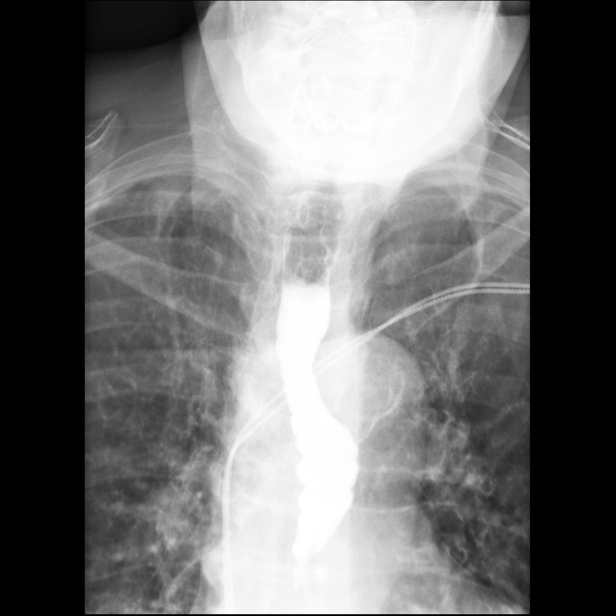
[im 7/13]
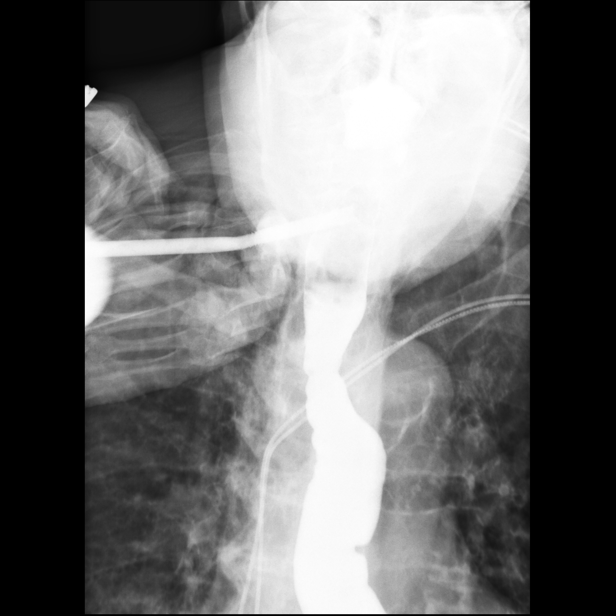
[im 8/13]
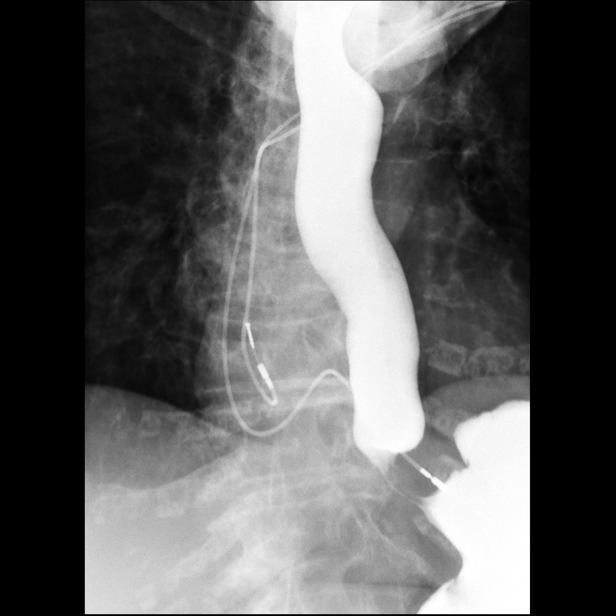
[im 9/13]
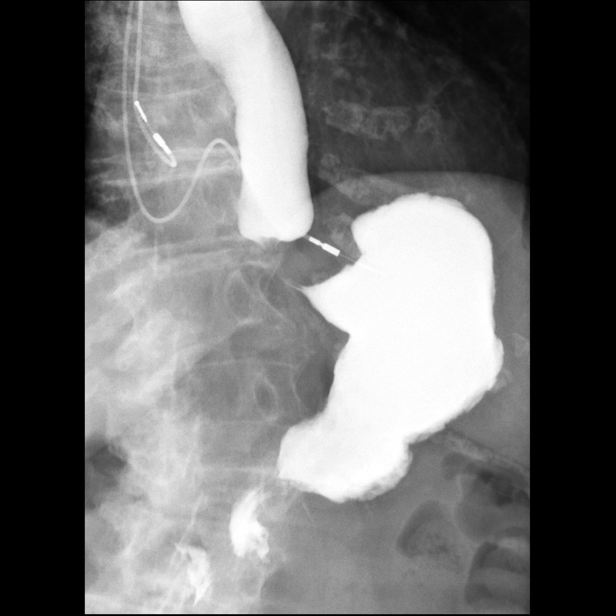
[im 10/13]
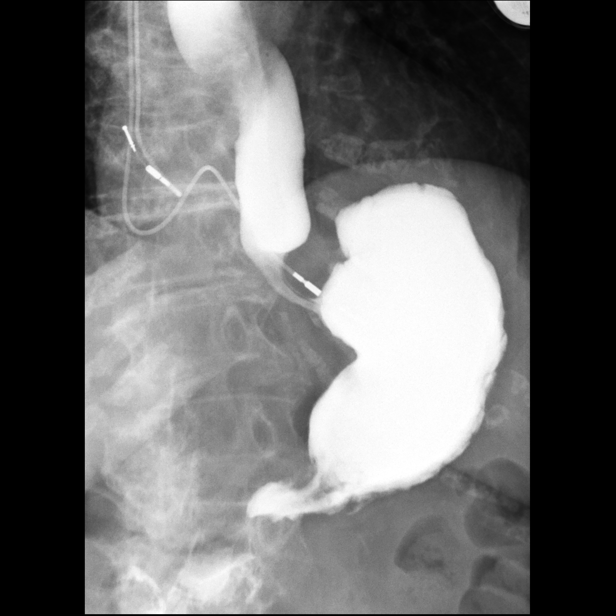
[im 11/13]
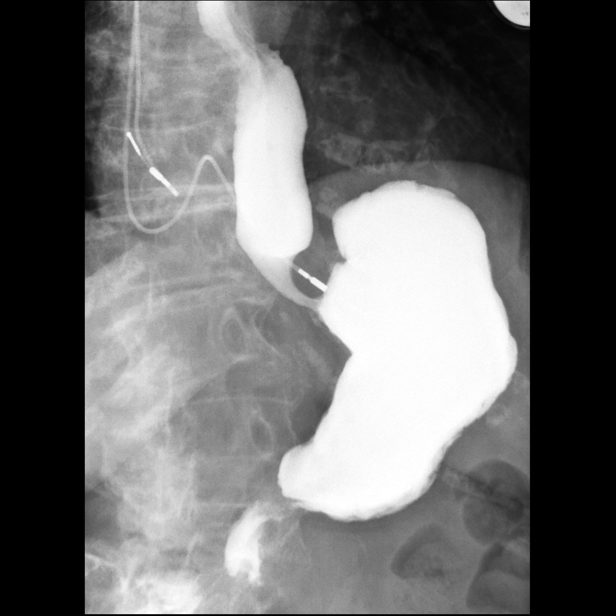
[im 12/13]
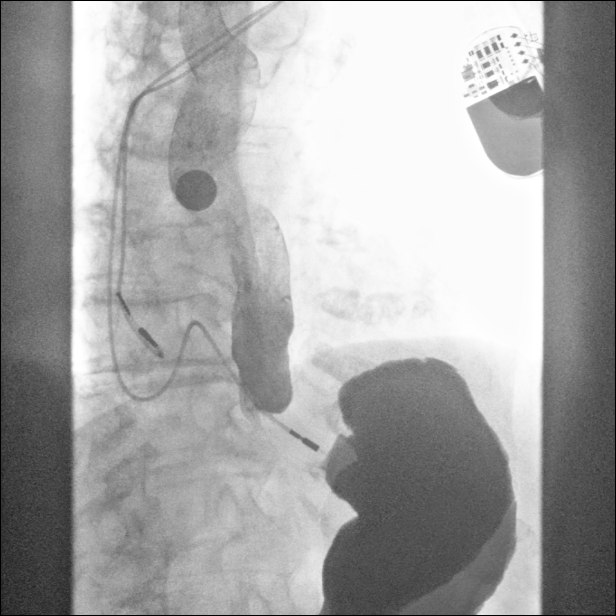
[im 13/13]
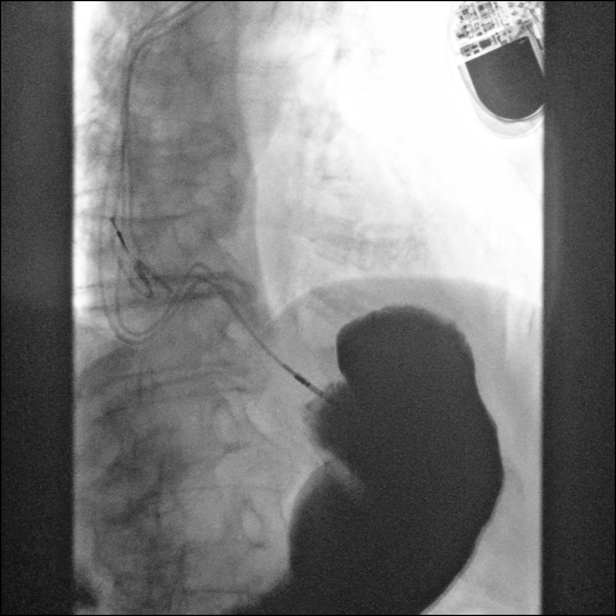

[13 of 13 positions shown; findings below may reference images not displayed]

FINDINGS: Exam limited by patient mobility. Supine images with thin barium
were obtained.

Poor esophageal motility. Diffuse esophageal dilatation. No ulcer or
mass

Moderate narrowing distal esophagus with smooth mucosal surface. No
mass lesion. The appearance is similar to the prior examination.

Barium tablet passed into the stomach without significant delay
IMPRESSION: Poor esophageal motility. Dilated esophagus with tertiary
contractions

Smooth narrowing distal esophagus similar to the prior study.
Moderate narrowing. Barium tablet passed through this area after
several sips of water.

Please select appropriate template.

## 2018-10-13 IMAGING — CR DG CHEST 2V
2 series · 2 of 2 positions shown · non-contrast
Comparison: 05/31/2017

CLINICAL DATA: Lung crackles

EXAM:
CHEST  2 VIEW

[w chest lat]
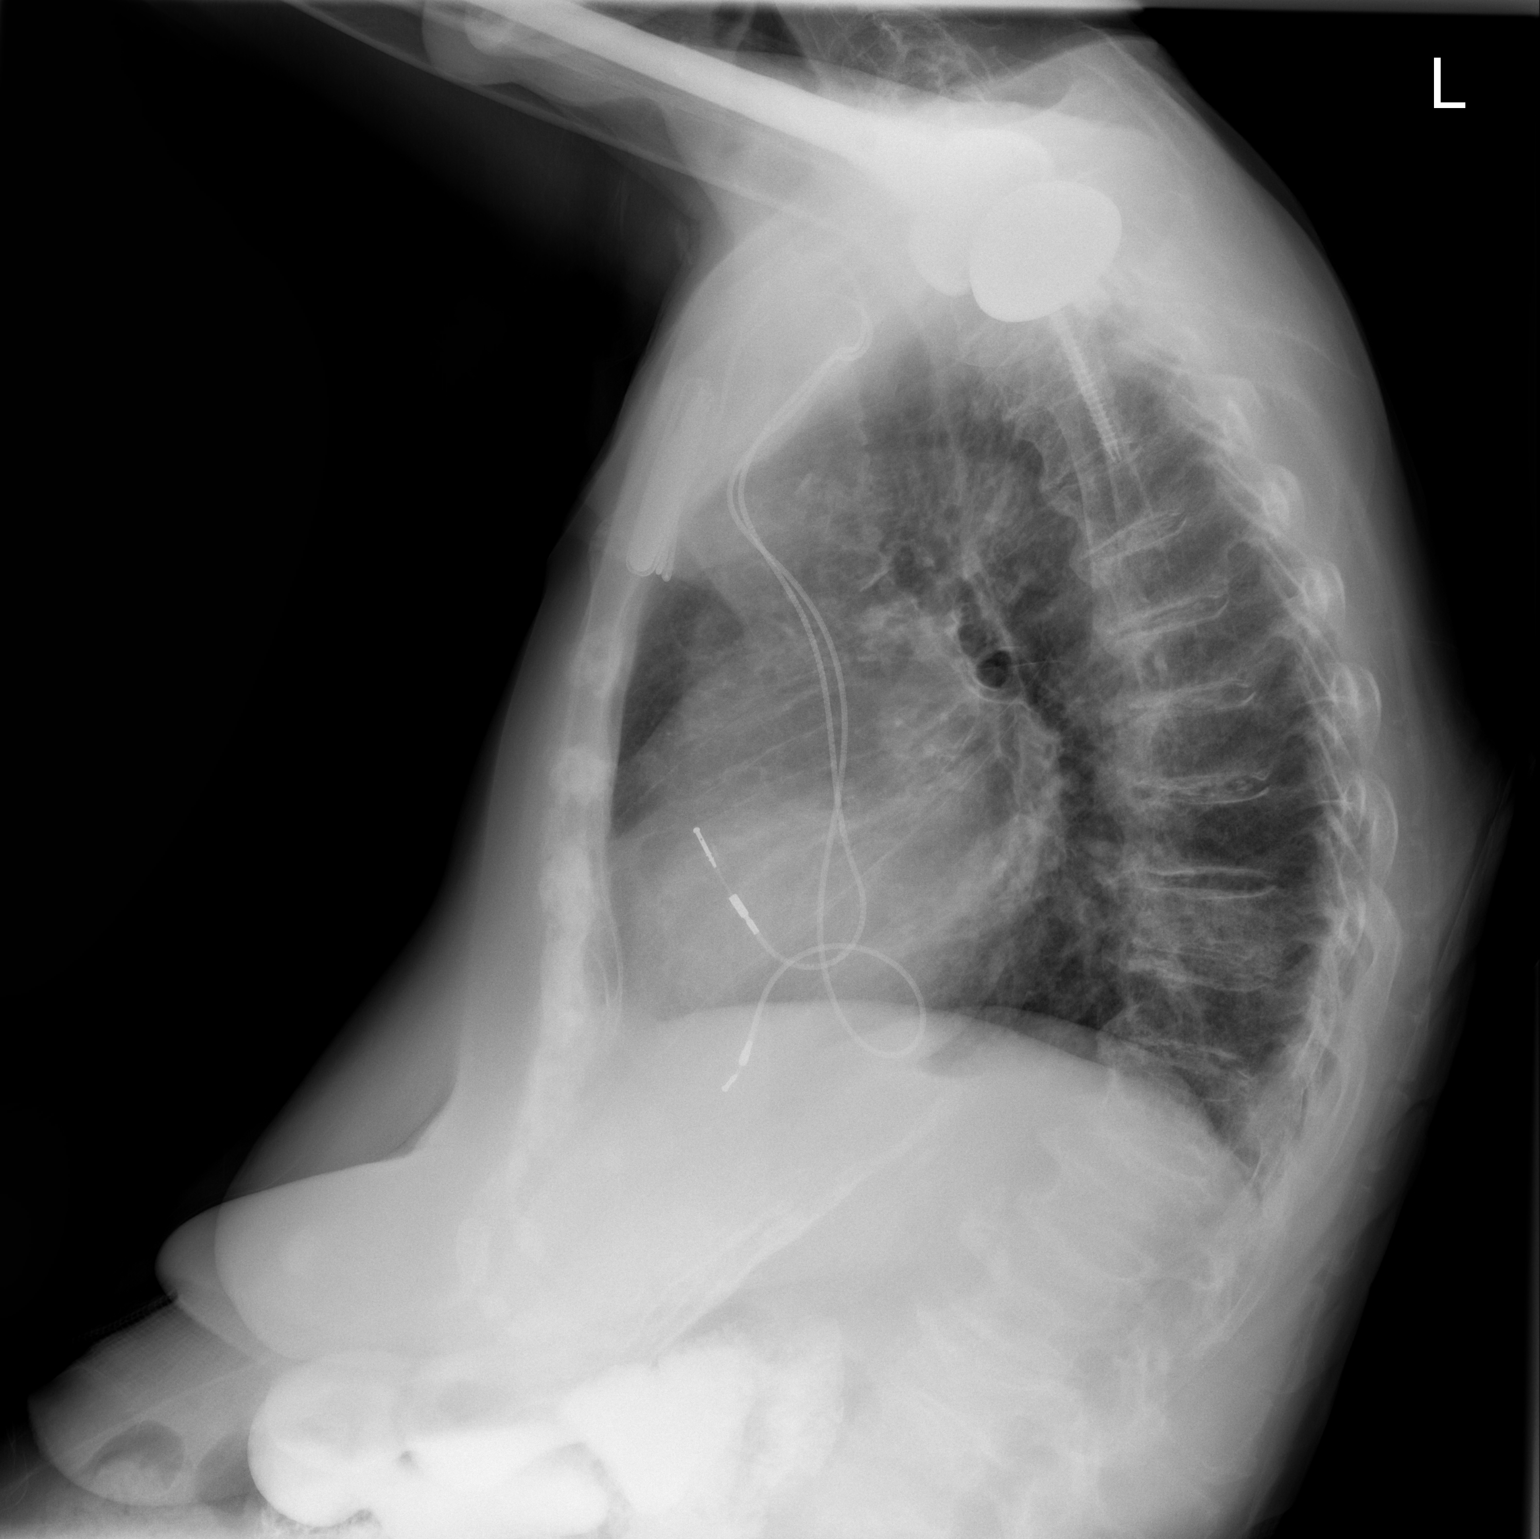

[w chest ap]
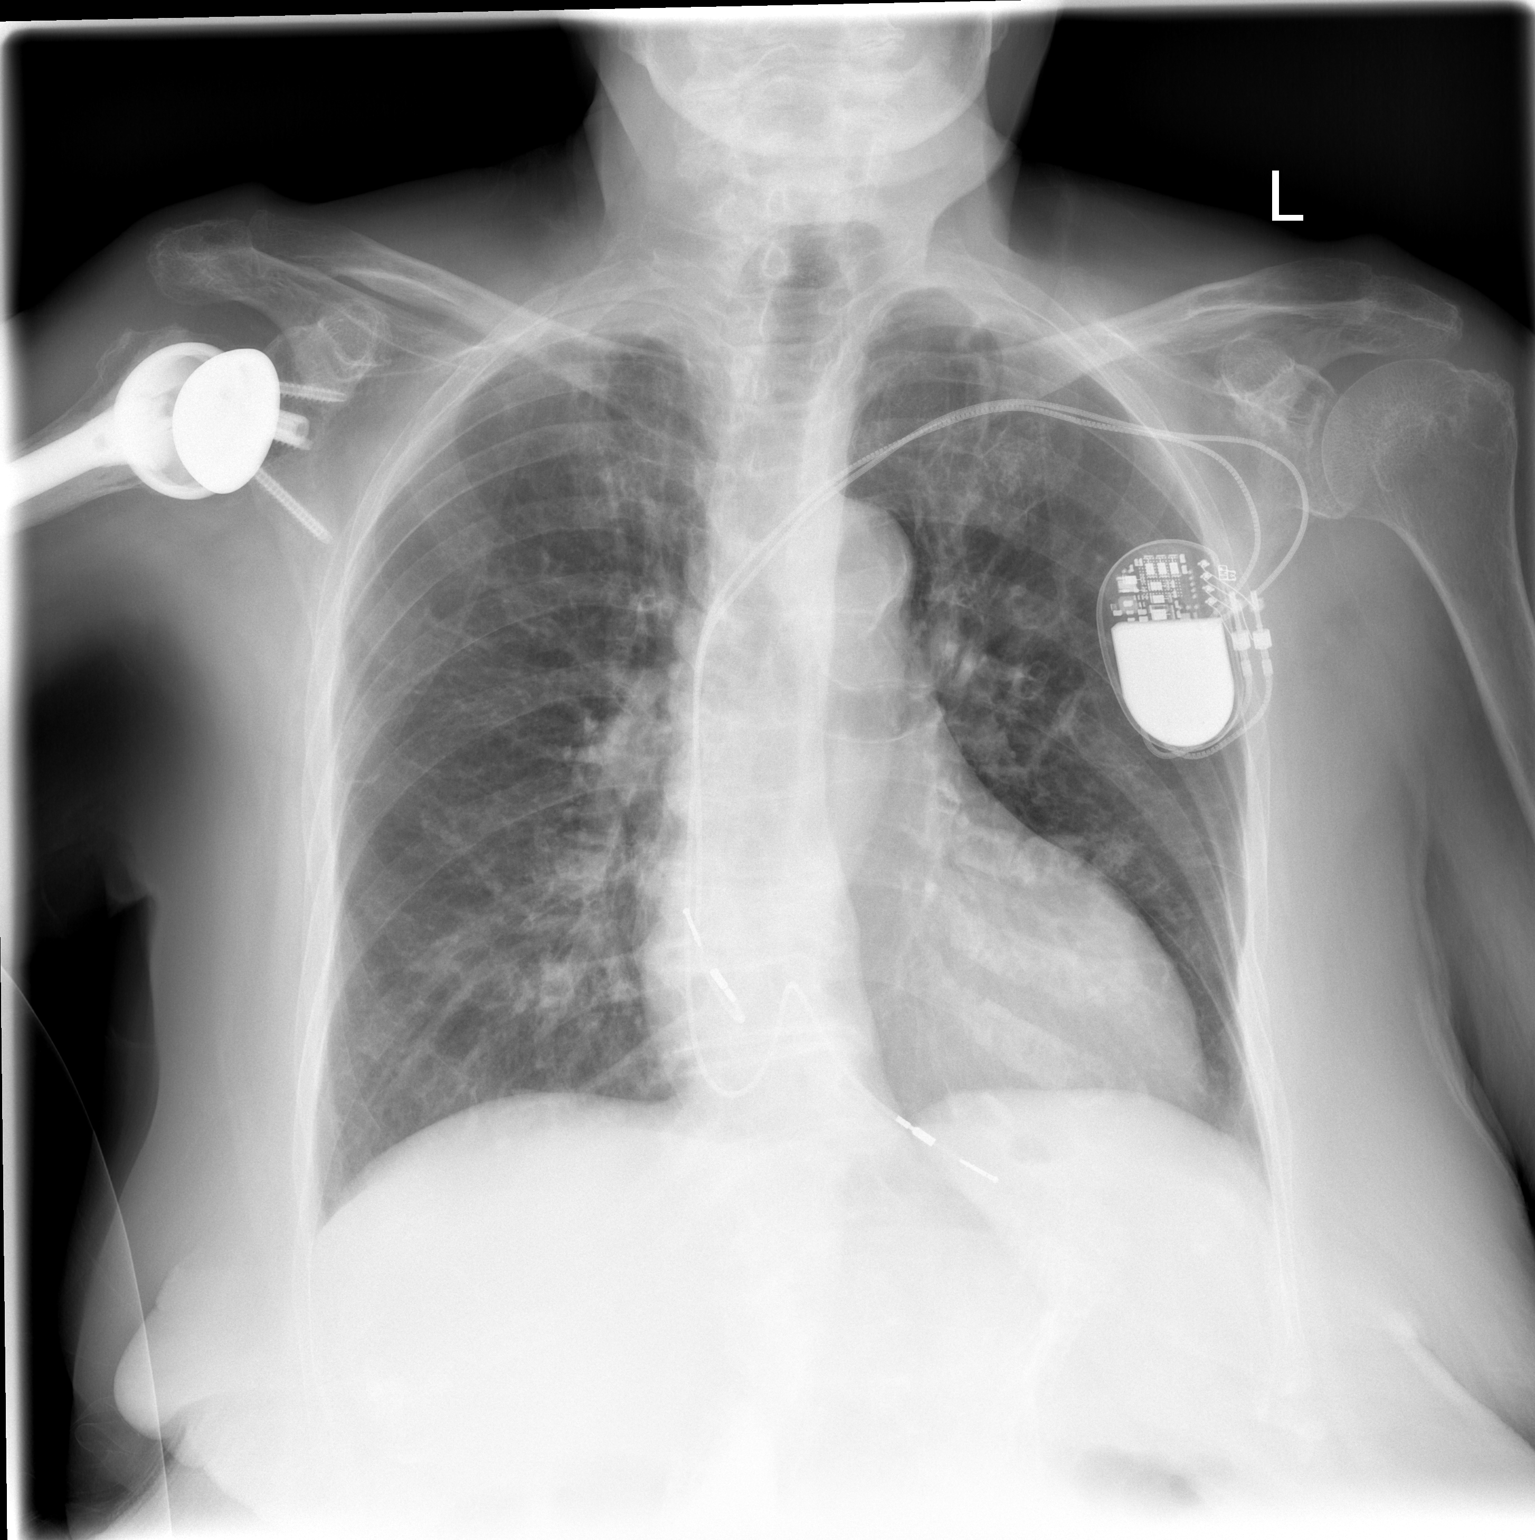

[2 of 2 positions shown; findings below may reference images not displayed]

FINDINGS: Heart size mildly enlarged. Negative for heart failure. Dual lead
pacemaker unchanged.

Negative for infiltrate effusion or mass. Apical scarring
bilaterally. Right shoulder replacement.
IMPRESSION: No active cardiopulmonary disease.

## 2018-10-17 ENCOUNTER — Other Ambulatory Visit: Payer: Self-pay | Admitting: Family Medicine

## 2018-10-17 DIAGNOSIS — G894 Chronic pain syndrome: Secondary | ICD-10-CM

## 2018-10-18 ENCOUNTER — Other Ambulatory Visit: Payer: Self-pay

## 2018-10-18 ENCOUNTER — Telehealth (INDEPENDENT_AMBULATORY_CARE_PROVIDER_SITE_OTHER): Payer: Medicare Other | Admitting: Family Medicine

## 2018-10-18 DIAGNOSIS — M7989 Other specified soft tissue disorders: Secondary | ICD-10-CM

## 2018-10-18 DIAGNOSIS — L02419 Cutaneous abscess of limb, unspecified: Secondary | ICD-10-CM | POA: Diagnosis not present

## 2018-10-18 HISTORY — DX: Other specified soft tissue disorders: M79.89

## 2018-10-18 NOTE — Assessment & Plan Note (Addendum)
New problem Uncertain cause, uncertain prognosis Ddx: shoulder stem arthroplasty abnormality, Muscle ligament tear, bony growth, soft tissue tumor (sarcoma / fibroma), bony overgrowth, humeral fracture, bone mets, UE DVT (unlikely given focal area of enlargement) Patient to see Dr Lesly Pontarelli on 6/22 for F2F physical exam and assessment.

## 2018-10-18 NOTE — Progress Notes (Signed)
[place] / E- Visit via Doximity  This visit type was conducted due to national recommendations for restrictions regarding the COVID-19 Pandemic (e.g. social distancing) in an effort to limit this patient's exposure and mitigate transmission in our community.   Due to their co-morbid illnesses, this patient is at least at moderate risk for complications without adequate follow up: yes   This format is felt to be most appropriate for this patient at this time.  All issues noted in this document were discussed and addressed.  A limited physical exam was performed with this format.   Patient consented to have visit conducted via Doximity  Encounter participants: Patient: Jordan [name]  Patient Location: Home Provider: Sherren Mocha Aleda Madl at office  Others (if applicable): [name] Ruales ([name], Publishing copy)  Chief Complaint: Swollen right upper arm  HPI: History elements: Onset / Duration: around 4 days ago Quality: Acute; aching Location: right upper arm(s): upper, lateral Severity: moderate Timing: constant Course: stable Modifying factors / Treatment prior to visit: APAP Context: area of enlargement noted when patient began complaining about discomfort to her dgt Associated symptoms:  No fever No recalled trauma or injury   Relevant PMH:  Pain noted in right shoulder in ov notes starting 2009. Xray right shoulder 2009 showed AC degenerative changes and osteopenia  Pain in shoulder again 2014 ov. 2015 pt saw Pain Clinic for shoulder and had dx of rotator cuff tear. . Pain c/o continues into 2016.  MRI showed rotator cuff retraction and GH joint instability. Plan surgical repair 03/2015 right shoulder prosthetic arthroplasty  ROS:  No Fever No Chills  Exam:  Respiratory: speaking in full sentence, no audible wheeze Right anterolateral soft tissue prominence from proximal humeral region to past mid-humerus. No indnetation by dgt's palpation.   Firm by report.  Tender complaint to dgt palpation of fullnwess.  Remainder of right arm does not appear enlarged / Full No similar fullness on left proximal arm.   Assessment/Plan:  Mass of soft tissue of upper arm New problem Uncertain cause, uncertain prognosis Ddx: shoulder stem arthroplasty fracture, Muscle ligament tear, bony growth, soft tissue tumor (sarcoma / fibroma), bony overgrowth, humeral fracture, bone mets, UE DVT (unlikely given focal area of enlargement) Patient to see Dr Kristiane Morsch on 6/22 for F2F physical exam and assessment.     COVID-19 Education: The signs and symptoms of COVID-19 were discussed with the patient and how to seek care for testing (follow up with PCP or arrange E-visit).  The importance of social distancing was discussed today.  Time spent on phone with patient: 25 minutes

## 2018-10-22 ENCOUNTER — Encounter: Payer: Self-pay | Admitting: Family Medicine

## 2018-10-22 ENCOUNTER — Ambulatory Visit (INDEPENDENT_AMBULATORY_CARE_PROVIDER_SITE_OTHER): Payer: Medicare Other | Admitting: Family Medicine

## 2018-10-22 ENCOUNTER — Other Ambulatory Visit: Payer: Self-pay

## 2018-10-22 VITALS — BP 138/52 | HR 69 | Temp 98.6°F

## 2018-10-22 DIAGNOSIS — M25511 Pain in right shoulder: Secondary | ICD-10-CM | POA: Diagnosis not present

## 2018-10-22 NOTE — Progress Notes (Addendum)
Subjective:    Patient ID: Jordan [name], female    DOB: 1919-12-08, 85 y.o.   MRN: [id number] Jordan [name] is accompanied by daughter and adult caretaker, Jordan [name] Sources of clinical information for visit is/are patient, relative(s), past medical records and 03/2015 right shoulder Xray report.  History/P.E. limitations: dementia  Adult vaccines due  Topic Date Due  . TETANUS/TDAP  05/19/2019   There are no preventive care reminders to display for this patient. There are no preventive care reminders to display for this patient.   Chief Complaint  Patient presents with  . Shoulder Pain     HPI Right shoulder pain - Follow up from video visit on 10/18/18 - Onset pain about one week ago - Aching in right lateral shoulder and proximal lateral upper arm to about middle upper arm - current pain 7/10 severity - Taking APAP & tramadol ~ 1 a day for pain (does not take more bc of constipation) -  [name] concerned that there is a firm area of enlargement in right proximal lateral anterolateral upper arm.  - Patient is able to use arm below level of shouder but there is some increase pain.. Pain significantly worse if taken above 90 degrees.  - No recent recalled trauma - SH: Pt's dgt and son work outside the home.  There is a aide for custodial care when family is at work.   - Relevant PMH: S/p reverse right shoulder arthroplasty by Netta Cedars, MD (Ortho) in November 2016.    Review of Systems  No fever, chills, cough, sore throat No exposure to CoViD positive patient nor patient under investigation.      Objective:   Physical Exam VS- reviewed General: in WC, no acute distress though not usual bright affect. Right shoulder   -subacromial sulcus wider than left   - possible slight prominence of anterolateral region of deltoid.  No edema.    - TTP proximal upper arm to mid-upper arm anteriorly.    - possible subtle anterior step-off in mid-humeral location   - Patient able to  abduct and flex right arm to 90 degrees without difficulty.  AROM did increase right arm pain.      Assessment & Plan:   Right shoulder pain, acute - Possible origin of pain from right reverse arthroplasty shoulder humeral prosthetic stem  Plan: Patient referral to Dr Cay Schillings, partner for Dr Veverly Fells, at [name], for appointment tomorrow at 10/23/2018 10:00am.    Basic Metabolic Panel:    Component Value Date/Time   NA 136 04/18/2018 1700   K 4.7 04/18/2018 1700   CL 98 04/18/2018 1700   CO2 27 04/18/2018 1700   BUN 14 04/18/2018 1700   CREATININE 1.38 (H) 04/18/2018 1700   CREATININE 1.26 (H) 05/26/2016 1645   GLUCOSE 129 (H) 04/18/2018 1700   GLUCOSE 61 (L) 04/09/2018 0616   CALCIUM 8.9 04/18/2018 1700     Outpatient Encounter Medications as of 10/22/2018  Medication Sig  . acetaminophen (TYLENOL) 650 MG CR tablet Take 650 mg by mouth 2 (two) times daily as needed for pain.  Marland Kitchen allopurinol (ZYLOPRIM) 100 MG tablet Take 1 tablet (100 mg total) by mouth daily.  Marland Kitchen apixaban (ELIQUIS) 2.5 MG TABS tablet Take 2.5 mg by mouth 2 (two) times daily.  . beta carotene w/minerals (OCUVITE) tablet Take 1 tablet by mouth daily.  Marland Kitchen COLCRYS 0.6 MG tablet TAKE 1 TABLET BY MOUTH TWICE DAILY  . cycloSPORINE (RESTASIS) 0.05 % ophthalmic emulsion Place 1  drop into both eyes at bedtime.  Marland Kitchen Dexlansoprazole 30 MG capsule Take 1 capsule (30 mg total) by mouth daily.  . diclofenac sodium (VOLTAREN) 1 % GEL Apply 2 g topically 4 (four) times daily as needed (pain).  Marland Kitchen docusate sodium (COLACE) 100 MG capsule Take 2 capsules (200 mg total) by mouth 2 (two) times daily.  . furosemide (LASIX) 20 MG tablet TAKE 1 TABLET BY MOUTH DAILY AS NEEDED FOR SWELLING  . gabapentin (NEURONTIN) 100 MG capsule TAKE 2 CAPSULES BY MOUTH TWICE DAILY  . levothyroxine (SYNTHROID, LEVOTHROID) 100 MCG tablet TAKE 1 TABLET BY MOUTH ONCE DAILY  . Lidocaine 0.5 % GEL Apply 1 application topically daily as needed (back pain).  .  metoprolol succinate (TOPROL-XL) 25 MG 24 hr tablet Take 1 tablet (25 mg total) by mouth daily. (Patient taking differently: Take 12.5 mg by mouth daily. )  . mirtazapine (REMERON) 7.5 MG tablet Take 2 tablets (15 mg total) by mouth at bedtime.  Marland Kitchen omeprazole (PRILOSEC) 20 MG capsule Take 1 capsule (20 mg total) by mouth daily.  . ondansetron (ZOFRAN) 4 MG tablet Take 1 tablet (4 mg total) by mouth daily as needed for nausea or vomiting.  . polyethylene glycol (MIRALAX / GLYCOLAX) packet take 17 grams by mouth once daily (Patient taking differently: daily as needed. take 17 grams by mouth once daily)  . TOVIAZ 4 MG TB24 tablet TAKE 1 TABLET BY MOUTH ONCE DAILY  . traMADol (ULTRAM) 50 MG tablet Take 0.5-1 tablets (25-50 mg total) by mouth every 8 (eight) hours as needed for moderate pain.  . TRAVATAN Z 0.004 % SOLN ophthalmic solution Place 1 drop into both eyes at bedtime.   . traZODone (DESYREL) 50 MG tablet TAKE 1 TABLET BY MOUTH AT BEDTIME (Patient taking differently: 25 mg. )  . vitamin B-12 (CYANOCOBALAMIN) 1000 MCG tablet Take 1,000 mcg by mouth daily.   No facility-administered encounter medications on file as of 10/22/2018.     Past Surgical History:  Procedure Laterality Date  . CARPAL TUNNEL RELEASE Right   . ESOPHAGOGASTRODUODENOSCOPY N/A 09/15/2015   Procedure: ESOPHAGOGASTRODUODENOSCOPY (EGD);  Surgeon: Clarene Essex, MD;  Location[facility] ENDOSCOPY;  Service: Endoscopy;  Laterality: N/A;  . EYE SURGERY     cataract, bilat.  Randolm Idol / REPLACE / REMOVE PACEMAKER    . LOOP RECORDER IMPLANT  02-08-13; 08-30-13   MDT LinQ implanted by Dr Lovena Le for syncope, explanted 08-30-13 after CHB identified  . LOOP RECORDER IMPLANT N/A 02/11/2013   Procedure: LOOP RECORDER IMPLANT;  Surgeon: Evans Lance, MD;  Location: [facility] CATH LAB;  Service: Cardiovascular;  Laterality: N/A;  . PACEMAKER INSERTION  08-30-2013   MDT ADDRL1 pacemaker implanted by Dr Rayann Heman for CHB  . PERMANENT PACEMAKER INSERTION N/A  08/30/2013   Procedure: PERMANENT PACEMAKER INSERTION;  Surgeon: Coralyn Mark, MD;  Location: [place] CATH LAB;  Service: Cardiovascular;  Laterality: N/A;  . REVERSE SHOULDER ARTHROPLASTY Right 03/20/2015   Procedure: RIGHT REVERSE SHOULDER ARTHROPLASTY;  Surgeon: Netta Cedars, MD;  Location: [place];  Service: Orthopedics;  Laterality: Right;  . TOTAL ABDOMINAL HYSTERECTOMY W/ BILATERAL SALPINGOOPHORECTOMY  03-15-2005    Past Medical History:  Diagnosis Date  . Acute hip pain, left 06/29/2018  . Acute pancreatitis 04/05/2018  . AKI (acute kidney injury) ([place])   . Alzheimer's dementia [facility]) 07/30/2013   04/17/14 MoCA - Blind (Spanish version); 9 out of 22.  Correct MoCA = 12 out of 30.    . Arthritis   .  Atherosclerosis of aorta ([place]) 10/05/2015  . Back pain 10/05/2015  . Bronchiectasis ([place]) 10/03/2017  . Calcification of native coronary artery 10/05/2015   Chest CT 2011.   . Carpal tunnel syndrome 06/29/2006   Qualifier: Diagnosis of  By: Benna Dunks    . Cervicalgia   . Cervicalgia 05/18/2009   Qualifier: Diagnosis of  By: Walker Kehr MD, Patrick Jupiter    . CHF (congestive heart failure) ([place])   . Chronic bilateral thoracic back pain 10/05/2015  . Chronic constipation   . Chronic diastolic heart failure ([place]) 09/01/2013  . Chronic pain syndrome 05/09/2014  . CKD (chronic kidney disease) stage 3, GFR 30-59 ml/min (HCC) 09/03/2013  . Combined visual hearing impairment 11/12/2013  . Complete heart block ([place])   . Constipation   . Cryptogenic stroke ([place]) S/P  LOOP RECORDER IMPLANT   DX  01/2013  --  SYNCOPAL EPISODE --  EPISODIC  ATRIAL TACHY/ FIBULATION  AND PAUSES  . CYSTOCELE/RECTOCELE/PROLAPSE,UNSPEC. 06/29/2006   Qualifier: Diagnosis of  By: Benna Dunks    . Depressive disorder 06/29/2006       . Dilation of esophagus 06/16/2017   Barium Esophagram 06/25/17: Poor esophageal motility. Dilated esophagus with tertiary contractions.  Smooth narrowing distal esophagus similar to the prior study.  Moderate narrowing.  Barium tablet passed through this area after several sips of water.  Marland Kitchen Displaced fracture of fifth metatarsal bone, right foot, subsequent encounter for fracture with routine healing 06/05/2017  . Diverticulosis of colon   . DIVERTICULOSIS OF COLON 06/29/2006   Qualifier: Diagnosis of  By: Benna Dunks    . Dry eyes   . Dysphagia 09/15/2015  . Fall from bed 03/17/2016  . Fatigue   . Food impaction of esophagus   . Frequency of urination   . Gait instability   . Generalized osteoarthritis of multiple sites 06/15/2017  . Glaucoma 04/18/2014  . Glaucoma of both eyes   . Gout 09/21/2013  . Hand Heaviness 03/17/2016  . Headache   . Hearing loss of aging 05/09/2014  . Hearing loss of both ears    no hearing aids  . HERNIA, INCISIONAL VENTRAL W/O OBST/GNGR 10/25/2006   Has been seen by surgery in the past and told that this is not dangerous and should only be treated particularly bothersome    . History of acute gouty arthritis 09/21/2013  . History of colon polyps   . History of Food impaction of esophagus   . HTN (hypertension) 06/29/2006   Isolated Systolic Hypertension - Ambulatory BP  Monitoring result 4/28-29/2015   . Hyperlipidemia 02/27/2015  . Hyperplastic colon polyp 2006   colonoscope  . Hypertension   . Hypochloremia   . Hyponatremia   . Hypothyroidism   . Impaired mobility and ADLs 05/30/2014  . Influenza A 05/14/2016  . Insomnia   . INTERSTITIAL CYSTITIS 09/21/2009   Qualifier: Diagnosis of  By: Sarita Haver  MD, Coralyn Mark    . Loss of weight 04/05/2010   Qualifier: Diagnosis of  By: Walker Kehr MD, Patrick Jupiter    . Lumbar stenosis   . Macular degeneration, age related, nonexudative 04/18/2014  . MASS, LUNG 04/05/2010   Qualifier: Diagnosis of  By: Walker Kehr MD, Patrick Jupiter    . Mobitz type 2 second degree heart block 08/29/2013  . Moderate dementia ([place])   . Narrowing of Distal Esophagus 06/16/2017   Barium Esophagram (06/15/17) Poor esophageal motility. Dilated esophagus with tertiary contractions.  Smooth  narrowing distal esophagus similar to the prior study.  Moderate narrowing. Barium tablet  passed through this area after several sips of water.   . OA (osteoarthritis)    RIGHT SHOULDER AC JOINT  . Orthostasis 04/28/2016  . OSTEOARTHRITIS, MULTI SITES 06/29/2006   Qualifier: Diagnosis of  By: Benna Dunks    . Osteoporosis   . OSTEOPOROSIS 03/20/2008   Qualifier: Diagnosis of  By: Jerline Pain MD, Anderson Malta    . Pacemaker   . 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 Peripheral neuropathy   . PERIPHERAL NEUROPATHY 03/29/2010   Qualifier: Diagnosis of  By: Walker Kehr MD, Patrick Jupiter    . Persistent headaches 09/06/2013  . Polymyalgia rheumatica ([place]) 11/08/2013  . Presbyesophagus (senile esophageal dysmotility) 06/16/2017  . Presence of permanent cardiac pacemaker   . Rib pain on left side 10/05/2015  . Right knee pain 01/15/2014  . Right rotator cuff tear   . Rotator cuff tear arthropathy 09/11/2012   Likely related to rotator cuff tendinopathy seen on musculoskeletal ultrasound, and GH DJD.  Complete tear of right supra and infraspinatus on MRI 10/2012     . S/P shoulder replacement 03/20/2015  . Shortness of breath 07/21/2014  . Shortness of breath dyspnea   . Spinal stenosis of lumbar region 10/03/2012   Patient has significant low back pain related to spinal stenosis and DJD, DDD.  We had a long discussion about treatment options which are quite limited. She is likely not a good surgical candidate for laminectomy. I am not sure about the usefulness of epidural steroid injection. We'll plan on restarting meloxicam after a long discussion of the pros and cons and risks of chronic kidney disease.  The patient and her family feel that the benefit of good pain control is the small risk.  Additionally she will followup with her primary care  provider for creatinine monitoring.  Will obtain physical therapy for TENS unit trial.  Will refer to pain management for consideration of epidural steroid injection as that worked well in the past for cervical pain Followup here as needed   . SUI (stress urinary incontinence, female)   . Syncope and collapse 02/09/2013  . Urinary frequency 06/05/2015  . Urinary incontinence, mixed 06/29/2006   Qualifier: Diagnosis of  By: Benna Dunks    . UTI (urinary tract infection) 05/27/2016  . UTI (urinary tract infection) 05/2017  . Vertigo 10/18/2010  . Weakness 08/29/2013

## 2018-10-23 ENCOUNTER — Other Ambulatory Visit: Payer: Self-pay | Admitting: Family Medicine

## 2018-10-23 DIAGNOSIS — M25511 Pain in right shoulder: Secondary | ICD-10-CM | POA: Diagnosis not present

## 2018-11-05 ENCOUNTER — Other Ambulatory Visit: Payer: Self-pay | Admitting: Cardiology

## 2018-11-06 DIAGNOSIS — M25511 Pain in right shoulder: Secondary | ICD-10-CM | POA: Diagnosis not present

## 2018-11-06 DIAGNOSIS — Z96611 Presence of right artificial shoulder joint: Secondary | ICD-10-CM | POA: Diagnosis not present

## 2018-11-07 ENCOUNTER — Encounter: Payer: Self-pay | Admitting: Cardiology

## 2018-11-07 DIAGNOSIS — Z45018 Encounter for adjustment and management of other part of cardiac pacemaker: Secondary | ICD-10-CM | POA: Insufficient documentation

## 2018-11-07 DIAGNOSIS — Z419 Encounter for procedure for purposes other than remedying health state, unspecified: Secondary | ICD-10-CM | POA: Insufficient documentation

## 2018-11-20 DIAGNOSIS — Z45018 Encounter for adjustment and management of other part of cardiac pacemaker: Secondary | ICD-10-CM | POA: Diagnosis not present

## 2018-11-20 DIAGNOSIS — Z950 Presence of cardiac pacemaker: Secondary | ICD-10-CM | POA: Diagnosis not present

## 2018-11-20 DIAGNOSIS — I4891 Unspecified atrial fibrillation: Secondary | ICD-10-CM | POA: Diagnosis not present

## 2018-11-29 ENCOUNTER — Other Ambulatory Visit: Payer: Self-pay | Admitting: Family Medicine

## 2018-12-13 ENCOUNTER — Other Ambulatory Visit: Payer: Self-pay

## 2018-12-13 ENCOUNTER — Encounter: Payer: Self-pay | Admitting: Cardiology

## 2018-12-13 ENCOUNTER — Ambulatory Visit (INDEPENDENT_AMBULATORY_CARE_PROVIDER_SITE_OTHER): Payer: Medicare Other | Admitting: Cardiology

## 2018-12-13 VITALS — BP 147/52 | HR 63 | Temp 98.4°F | Ht <= 58 in | Wt 104.0 lb

## 2018-12-13 DIAGNOSIS — I442 Atrioventricular block, complete: Secondary | ICD-10-CM

## 2018-12-13 DIAGNOSIS — Z950 Presence of cardiac pacemaker: Secondary | ICD-10-CM

## 2018-12-13 DIAGNOSIS — I480 Paroxysmal atrial fibrillation: Secondary | ICD-10-CM | POA: Diagnosis not present

## 2018-12-13 DIAGNOSIS — Z45018 Encounter for adjustment and management of other part of cardiac pacemaker: Secondary | ICD-10-CM | POA: Diagnosis not present

## 2018-12-13 NOTE — Progress Notes (Signed)
Primary Physician/Referring:  McDiarmid, Blane Ohara, MD  Patient ID: Jordan [name], female    DOB: 1919/06/13, 85 y.o.   MRN: [id number]  Chief Complaint  Patient presents with  . Coronary Artery Disease    swelling  . Pacemaker Check    HPI: Jordan [name]  is a 85 y.o. female  with h/o complete heart block, sick sinus syndrome S/P pacemaker implantation with Medtronic pacemaker on 09/24/2013, pacemaker dependant, PAF with prolonged sustained atrial fibrillation with RVR on 09/16/2015 for 2 hours and 32 minutes and no further recurrence and on anticoagulation with Eliquis.  She underwent esophageal dilatation in July 2017 by Dr. Lizbeth Bark and has not had difficulty in swallowing since. Her past medical history also includes recurrent UTI, hyperlipidemia and hypertension. She is being seen here for pacemaker check and also follow-up of atrial fibrillation.  Except for chronic continued fatigue and occasional episodes of leg edema for which her daughter has been using process might on a p.r.n. basis, No specific complaints.  Past Medical History:  Diagnosis Date  . Acute hip pain, left 06/29/2018  . Acute pancreatitis 04/05/2018  . AKI (acute kidney injury) ([place])   . Alzheimer's dementia [facility]) 07/30/2013   04/17/14 MoCA - Blind (Spanish version); 9 out of 22.  Correct MoCA = 12 out of 30.    . Arthritis   . Atherosclerosis of aorta ([place]) 10/05/2015  . Back pain 10/05/2015  . Bronchiectasis ([place]) 10/03/2017  . Calcification of native coronary artery 10/05/2015   Chest CT 2011.   . Carpal tunnel syndrome 06/29/2006   Qualifier: Diagnosis of  By: Benna Dunks    . Cervicalgia   . Cervicalgia 05/18/2009   Qualifier: Diagnosis of  By: Walker Kehr MD, Patrick Jupiter    . CHF (congestive heart failure) ([place])   . Chronic bilateral thoracic back pain 10/05/2015  . Chronic constipation   . Chronic diastolic heart failure ([place]) 09/01/2013  . Chronic pain syndrome 05/09/2014  . CKD (chronic kidney disease) stage 3, GFR 30-59 ml/min  (HCC) 09/03/2013  . Combined visual hearing impairment 11/12/2013  . Complete heart block ([place])   . Constipation   . Cryptogenic stroke ([place]) S/P  LOOP RECORDER IMPLANT   DX  01/2013  --  SYNCOPAL EPISODE --  EPISODIC  ATRIAL TACHY/ FIBULATION  AND PAUSES  . CYSTOCELE/RECTOCELE/PROLAPSE,UNSPEC. 06/29/2006   Qualifier: Diagnosis of  By: Benna Dunks    . Depressive disorder 06/29/2006       . Dilation of esophagus 06/16/2017   Barium Esophagram 06/25/17: Poor esophageal motility. Dilated esophagus with tertiary contractions.  Smooth narrowing distal esophagus similar to the prior study.  Moderate narrowing. Barium tablet passed through this area after several sips of water.  Marland Kitchen Displaced fracture of fifth metatarsal bone, right foot, subsequent encounter for fracture with routine healing 06/05/2017  . Diverticulosis of colon   . DIVERTICULOSIS OF COLON 06/29/2006   Qualifier: Diagnosis of  By: Benna Dunks    . Dry eyes   . Dysphagia 09/15/2015  . Fall from bed 03/17/2016  . Fatigue   . Food impaction of esophagus   . Frequency of urination   . Gait instability   . Generalized osteoarthritis of multiple sites 06/15/2017  . Glaucoma 04/18/2014  . Glaucoma of both eyes   . Gout 09/21/2013  . Hand Heaviness 03/17/2016  . Headache   . Hearing loss of aging 05/09/2014  . Hearing loss of both ears    no hearing aids  .  HERNIA, INCISIONAL VENTRAL W/O OBST/GNGR 10/25/2006   Has been seen by surgery in the past and told that this is not dangerous and should only be treated particularly bothersome    . History of acute gouty arthritis 09/21/2013  . History of colon polyps   . History of Food impaction of esophagus   . HTN (hypertension) 06/29/2006   Isolated Systolic Hypertension - Ambulatory BP  Monitoring result 4/28-29/2015   . Hyperlipidemia 02/27/2015  . Hyperplastic colon polyp 2006   colonoscope  . Hypertension   . Hypochloremia   . Hyponatremia   . Hypothyroidism   . Impaired mobility and  ADLs 05/30/2014  . Influenza A 05/14/2016  . Insomnia   . INTERSTITIAL CYSTITIS 09/21/2009   Qualifier: Diagnosis of  By: Sarita Haver  MD, Coralyn Mark    . Loss of weight 04/05/2010   Qualifier: Diagnosis of  By: Walker Kehr MD, Patrick Jupiter    . Lumbar stenosis   . Macular degeneration, age related, nonexudative 04/18/2014  . MASS, LUNG 04/05/2010   Qualifier: Diagnosis of  By: Walker Kehr MD, Patrick Jupiter    . Mobitz type 2 second degree heart block 08/29/2013  . Moderate dementia ([place])   . Narrowing of Distal Esophagus 06/16/2017   Barium Esophagram (06/15/17) Poor esophageal motility. Dilated esophagus with tertiary contractions.  Smooth narrowing distal esophagus similar to the prior study.  Moderate narrowing. Barium tablet passed through this area after several sips of water.   . OA (osteoarthritis)    RIGHT SHOULDER AC JOINT  . Orthostasis 04/28/2016  . OSTEOARTHRITIS, MULTI SITES 06/29/2006   Qualifier: Diagnosis of  By: Benna Dunks    . Osteoporosis   . OSTEOPOROSIS 03/20/2008   Qualifier: Diagnosis of  By: Jerline Pain MD, Anderson Malta    . Pacemaker   . 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 Peripheral neuropathy   . PERIPHERAL NEUROPATHY 03/29/2010   Qualifier: Diagnosis of  By: Walker Kehr MD, Patrick Jupiter    . Persistent headaches 09/06/2013  . Polymyalgia rheumatica ([place]) 11/08/2013  . Presbyesophagus (senile esophageal dysmotility) 06/16/2017  . Presence of permanent cardiac pacemaker   . Rib pain on left side 10/05/2015  . Right knee pain 01/15/2014  . Right rotator cuff tear   . Rotator cuff tear arthropathy 09/11/2012   Likely related to rotator cuff tendinopathy seen on musculoskeletal ultrasound, and GH DJD.  Complete tear of right supra and infraspinatus on MRI 10/2012     . S/P shoulder replacement 03/20/2015  . Shortness of breath 07/21/2014  .  Shortness of breath dyspnea   . Spinal stenosis of lumbar region 10/03/2012   Patient has significant low back pain related to spinal stenosis and DJD, DDD.  We had a long discussion about treatment options which are quite limited. She is likely not a good surgical candidate for laminectomy. I am not sure about the usefulness of epidural steroid injection. We'll plan on restarting meloxicam after a long discussion of the pros and cons and risks of chronic kidney disease.  The patient and her family feel that the benefit of good pain control is the small risk.  Additionally she will followup with her primary care provider for creatinine monitoring.  Will obtain physical therapy for TENS unit trial.  Will refer to pain management for consideration of epidural steroid injection as that worked well in the past  for cervical pain Followup here as needed   . SUI (stress urinary incontinence, female)   . Syncope and collapse 02/09/2013  . Urinary frequency 06/05/2015  . Urinary incontinence, mixed 06/29/2006   Qualifier: Diagnosis of  By: Benna Dunks    . UTI (urinary tract infection) 05/27/2016  . UTI (urinary tract infection) 05/2017  . Vertigo 10/18/2010  . Weakness 08/29/2013    Past Surgical History:  Procedure Laterality Date  . CARPAL TUNNEL RELEASE Right   . ESOPHAGOGASTRODUODENOSCOPY N/A 09/15/2015   Procedure: ESOPHAGOGASTRODUODENOSCOPY (EGD);  Surgeon: Clarene Essex, MD;  Location: [facility] ENDOSCOPY;  Service: Endoscopy;  Laterality: N/A;  . EYE SURGERY     cataract, bilat.  Randolm Idol / REPLACE / REMOVE PACEMAKER    . LOOP RECORDER IMPLANT  02-08-13; 08-30-13   MDT LinQ implanted by Dr Lovena Le for syncope, explanted 08-30-13 after CHB identified  . LOOP RECORDER IMPLANT N/A 02/11/2013   Procedure: LOOP RECORDER IMPLANT;  Surgeon: Evans Lance, MD;  Location[facility] CATH LAB;  Service: Cardiovascular;  Laterality: N/A;  . PACEMAKER INSERTION  08-30-2013   MDT [id number] pacemaker implanted by Dr Rayann Heman for CHB  .  PERMANENT PACEMAKER INSERTION N/A 08/30/2013   Procedure: PERMANENT PACEMAKER INSERTION;  Surgeon: Coralyn Mark, MD;  Location: [place] [facility] CATH LAB;  Service: Cardiovascular;  Laterality: N/A;  . REVERSE SHOULDER ARTHROPLASTY Right 03/20/2015   Procedure: RIGHT REVERSE SHOULDER ARTHROPLASTY;  Surgeon: Netta Cedars, MD;  Location: [place];  Service: Orthopedics;  Laterality: Right;  . TOTAL ABDOMINAL HYSTERECTOMY W/ BILATERAL SALPINGOOPHORECTOMY  03-15-2005    Social History   Socioeconomic History  . Marital status: Widowed    Spouse name: Not on file  . Number of children: 3  . Years of education: Not on file  . Highest education level: Not on file  Occupational History  . Not on file  Social Needs  . Financial resource strain: Not on file  . Food insecurity    Worry: Not on file    Inability: Not on file  . Transportation needs    Medical: Not on file    Non-medical: Not on file  Tobacco Use  . Smoking status: Never Smoker  . Smokeless tobacco: Never Used  Substance and Sexual Activity  . Alcohol use: No    Alcohol/week: 0.0 standard drinks  . Drug use: No  . Sexual activity: Never  Lifestyle  . Physical activity    Days per week: Not on file    Minutes per session: Not on file  . Stress: Not on file  Relationships  . Social Herbalist on phone: Not on file    Gets together: Not on file    Attends religious service: Not on file    Active member of club or organization: Not on file    Attends meetings of clubs or organizations: Not on file    Relationship status: Not on file  . Intimate partner violence    Fear of current or ex partner: Not on file    Emotionally abused: Not on file    Physically abused: Not on file    Forced sexual activity: Not on file  Other Topics Concern  . Not on file  Social History Narrative   ** Merged History Encounter **       lives with son, [place]; No tob, etoh.  Has three total children. 1 g'child.;  Dtr [name] (w)  [phone], (h) [phone]; dtr [place]. Daughters usually interpret for  her at visits and are very involved and ask lots of questions.     Current Outpatient Medications on File Prior to Visit  Medication Sig Dispense Refill  . acetaminophen (TYLENOL) 650 MG CR tablet Take 650 mg by mouth 2 (two) times daily as needed for pain.    Marland Kitchen allopurinol (ZYLOPRIM) 100 MG tablet Take 1 tablet (100 mg total) by mouth daily. 90 tablet 3  . apixaban (ELIQUIS) 2.5 MG TABS tablet Take 2.5 mg by mouth 2 (two) times daily.    . beta carotene w/minerals (OCUVITE) tablet Take 1 tablet by mouth daily. 90 tablet 3  . COLCRYS 0.6 MG tablet TAKE 1 TABLET BY MOUTH TWICE DAILY 240 tablet 0  . cycloSPORINE (RESTASIS) 0.05 % ophthalmic emulsion Place 1 drop into both eyes at bedtime.    . diclofenac sodium (VOLTAREN) 1 % GEL Apply 2 g topically 4 (four) times daily as needed (pain). 100 g PRN  . docusate sodium (COLACE) 100 MG capsule Take 2 capsules (200 mg total) by mouth 2 (two) times daily. 30 capsule 0  . furosemide (LASIX) 20 MG tablet TAKE 1 TABLET BY MOUTH DAILY AS NEEDED FOR SWELLING 90 tablet 0  . gabapentin (NEURONTIN) 100 MG capsule TAKE 2 CAPSULES BY MOUTH TWICE DAILY 360 capsule 5  . levothyroxine (SYNTHROID) 100 MCG tablet TAKE 1 TABLET BY MOUTH ONCE DAILY 90 tablet PRN  . Lidocaine 0.5 % GEL Apply 1 application topically daily as needed (back pain). 170 g 5  . metoprolol succinate (TOPROL-XL) 25 MG 24 hr tablet Take 1 tablet (25 mg total) by mouth daily. 60 tablet 0  . mirtazapine (REMERON) 7.5 MG tablet Take 2 tablets (15 mg total) by mouth at bedtime. 60 tablet PRN  . ondansetron (ZOFRAN) 4 MG tablet Take 1 tablet (4 mg total) by mouth daily as needed for nausea or vomiting. 30 tablet 1  . polyethylene glycol (MIRALAX / GLYCOLAX) packet take 17 grams by mouth once daily (Patient taking differently: daily as needed. take 17 grams by mouth once daily) 100 packet PRN  . TOVIAZ 4 MG TB24 tablet TAKE 1 TABLET  BY MOUTH ONCE DAILY 30 tablet PRN  . traMADol (ULTRAM) 50 MG tablet Take 0.5-1 tablets (25-50 mg total) by mouth every 8 (eight) hours as needed for moderate pain. 90 tablet 2  . TRAVATAN Z 0.004 % SOLN ophthalmic solution Place 1 drop into both eyes at bedtime.     . traZODone (DESYREL) 50 MG tablet TAKE 1 TABLET BY MOUTH AT BEDTIME 90 tablet 1  . vitamin B-12 (CYANOCOBALAMIN) 1000 MCG tablet Take 1,000 mcg by mouth daily.    Marland Kitchen omeprazole (PRILOSEC) 40 MG capsule esomeprazole magnesium 40 mg capsule,delayed release     No current facility-administered medications on file prior to visit.    Review of Systems  Constitution: Positive for malaise/fatigue (chronic and no change). Negative for chills, decreased appetite and weight gain.  Cardiovascular: Positive for leg swelling. Negative for dyspnea on exertion and syncope.  Respiratory: Negative for hemoptysis, sleep disturbances due to breathing and wheezing.   Endocrine: Negative for cold intolerance.  Hematologic/Lymphatic: Does not bruise/bleed easily.  Musculoskeletal: Positive for arthritis, back pain (chronic) and joint swelling (bilateral knee).  Gastrointestinal: Negative for abdominal pain, anorexia and change in bowel habit.  Neurological: Negative for headaches and light-headedness.  Psychiatric/Behavioral: Negative for depression and substance abuse.  All other systems reviewed and are negative.     Objective  Blood pressure (!) 147/52, pulse 63,  temperature 98.4 F (36.9 C), height 4\' 5"  (1.346 m), weight 104 lb (47.2 kg), SpO2 96 %. Body mass index is 26.03 kg/m.    Physical Exam  Constitutional: No distress.  Short stature, frail  HENT:  Head: Atraumatic.  Eyes: Conjunctivae are normal.  Neck: Neck supple. No JVD present. No thyromegaly present.  Cardiovascular: Normal rate, regular rhythm, normal heart sounds and intact distal pulses. Exam reveals no gallop.  No murmur heard. Pulmonary/Chest: Effort normal. She has  rales (right base).  Pacemaker pocket in the left infraclavicular region  Abdominal: Soft. Bowel sounds are normal.  Musculoskeletal: Normal range of motion.        General: No edema.  Neurological: She is alert.  Skin: Skin is warm and dry.  Psychiatric: She has a normal mood and affect.   Radiology: No results found.  Laboratory examination:    CMP Latest Ref Rng & Units 04/18/2018 04/09/2018 04/08/2018  Glucose 65 - 99 mg/dL 129(H) 61(L) 68(L)  BUN 10 - 36 mg/dL 14 18 16   Creatinine 0.57 - 1.00 mg/dL 1.38(H) 1.14(H) 0.86  Sodium 134 - 144 mmol/L 136 134(L) 137  Potassium 3.5 - 5.2 mmol/L 4.7 4.3 3.9  Chloride 96 - 106 mmol/L 98 98 103  CO2 20 - 29 mmol/L 27 26 28   Calcium 8.7 - 10.3 mg/dL 8.9 8.5(L) 8.5(L)  Total Protein 6.0 - 8.5 g/dL 5.6(L) 5.0(L) 4.9(L)  Total Bilirubin 0.0 - 1.2 mg/dL 0.4 1.2 0.9  Alkaline Phos 39 - 117 IU/L 160(H) 164(H) 168(H)  AST 0 - 40 IU/L 25 28 39  ALT 0 - 32 IU/L 15 42 53(H)   CBC Latest Ref Rng & Units 04/18/2018 04/09/2018 04/07/2018  WBC 3.4 - 10.8 x10E3/uL 4.8 5.7 8.4  Hemoglobin 11.1 - 15.9 g/dL 11.2 10.9(L) 9.3(L)  Hematocrit 34.0 - 46.6 % 33.9(L) 34.6(L) 30.0(L)  Platelets 150 - 450 x10E3/uL 222 141(L) 103(L)   Lipid Panel     Component Value Date/Time   CHOL 217 (H) 08/11/2016 1239   TRIG 209 (H) 08/11/2016 1239   HDL 60 08/11/2016 1239   CHOLHDL 3.6 08/11/2016 1239   CHOLHDL 3.5 02/04/2016 1218   VLDL 41 (H) 02/04/2016 1218   LDLCALC 115 (H) 08/11/2016 1239   HEMOGLOBIN A1C Lab Results  Component Value Date   HGBA1C  07/01/2010    5.6 (NOTE)                                                                       According to the ADA Clinical Practice Recommendations for 2011, when HbA1c is used as a screening test:   >=6.5%   Diagnostic of Diabetes Mellitus           (if abnormal result  is confirmed)  5.7-6.4%   Increased risk of developing Diabetes Mellitus  References:Diagnosis and Classification of Diabetes Mellitus,Diabetes  YQMG,5003,70(WUGQB 1):S62-S69 and Standards of Medical Care in         Diabetes - 2011,Diabetes Care,2011,34  (Suppl 1):S11-S61.   MPG 114 07/01/2010   TSH No results for input(s): TSH in the last 8760 hours.  Cardiac Studies:   Echocardiogram [07/29/2014]: Normal LV systolic function LVEF 16-94%. Grade 1 diastolic dysfunction, mitral and a calcification.  Lower Extremity bilateral  venous Dopplers [08/30/2013]:  - No evidence of deep vein thrombosis involving the left lower extremity. - No evidence of deep vein thrombosis involving the right common femoral vein.  Assessment     ICD-10-CM   1. Encounter for care of pacemaker  Z45.018   2. Pacemaker Dual Chamber Medtronic Adapta L ADDR  Z95.0   3. Complete heart block (HCC) S/P permanent pacemaker 2015  I44.2 EKG 12-Lead  4. Paroxysmal atrial fibrillation (HCC) CHA2DS2-VASc Score is 6 with yearly risk of stroke of 9.8 %  I48.0     EKG 12/13/2018: A paced rhythm, no further analysis.    Scheduled Remote pacemaker check 11/06/2018:  10 AHR episodes with no EGMs. <1% atrial arrhythmia burden. No VHR episodes. Battery longevity is 7.5 years. RA pacing is 7.3 %, RV pacing is 100 %.  Scheduled In office pacemaker check 12/13/2018:  No mode switches. AP 7%, VP 100%. Longevity 7 years. Normal pacemaker function. Cardiac compass is stable. Limited activity noted.   Recommendations:  Patient is here on a scheduled visit and follow-up of pacemaker And also atrial fibrillation.  Fortunately she is maintaining sinus rhythm.  She feels well except for chronic failure to thrive in an adult, she is presently 85 years of age.  She has not had any bleeding diathesis, renal function is remained stable and fortunately compared to last year, she has not had frequent UTI or hospitalization. I simply reassured her and advised her that she is presently doing well, episodes of leg edema is related to diastolic heart failure, her daughter who was present  at the bedside is very cognitive and uses furosemide on a p.r.n. basis with complete resolution of symptoms.  She is presently on anticoagulation with Eliquis without bleeding diathesis for atrial fibrillation.  Continue the same.  Adrian Prows, MD, [facility] 12/15/2018, 10:08 AM [facility]. [place] Pager: [phone] Office: [phone] If no answer Cell [phone]

## 2019-01-01 ENCOUNTER — Other Ambulatory Visit: Payer: Self-pay | Admitting: Cardiology

## 2019-01-02 ENCOUNTER — Other Ambulatory Visit: Payer: Self-pay | Admitting: Cardiology

## 2019-01-21 ENCOUNTER — Telehealth: Payer: Self-pay | Admitting: *Deleted

## 2019-01-21 NOTE — Telephone Encounter (Signed)
Daughter wants to know if Dr. McDiarmid wants her to get a flu shot "even though she 4 has lost so much weight"  She also wants to know if the MD recommends pt get the high dose. Christen Bame, CMA

## 2019-01-22 NOTE — Telephone Encounter (Signed)
Sent Comm. Message to Mrs. Jolee Ewing with [facility] about this. I will inform pt once I get a response. Salvatore Marvel, CMA

## 2019-01-22 NOTE — Telephone Encounter (Signed)
Please advise Ms Jillene Bucks [facility]) that Jordan [name] is recommended to have the flu vaccination using the high-dose Flu vaccination.   We will investigate whether a [facility] nurse can administer the Flu vaccination in the home with doctor's order.   If not, the patient may come in for a nurse visit at the [facility], or she may obtain the vaccination from her local pharmacy.

## 2019-01-23 NOTE — Telephone Encounter (Signed)
Reviewed. Thank you.

## 2019-01-24 NOTE — Telephone Encounter (Signed)
I spoke with Mrs Jordan [name] (pt's dgt). Ms Jillene Bucks would like to bring her mother into the [facility] for a nurse visit for the high-dose Flu vaccination.  Please contact Ms Jillene Bucks to schedule an [facility] nurse visit for the Flu vaccination.  Thank you.

## 2019-01-24 NOTE — Telephone Encounter (Signed)
Patient has an appt on 01-31-2019.  Carmaleta Youngers,CMA

## 2019-01-31 ENCOUNTER — Other Ambulatory Visit: Payer: Self-pay

## 2019-01-31 ENCOUNTER — Ambulatory Visit (INDEPENDENT_AMBULATORY_CARE_PROVIDER_SITE_OTHER): Payer: Medicare Other

## 2019-01-31 DIAGNOSIS — Z23 Encounter for immunization: Secondary | ICD-10-CM

## 2019-01-31 NOTE — Progress Notes (Signed)
Patient presents in nurse clinic for Flu vaccine. Vaccine given, LD. Patient tolerated well.

## 2019-02-11 ENCOUNTER — Other Ambulatory Visit: Payer: Self-pay | Admitting: Family Medicine

## 2019-02-11 DIAGNOSIS — M5489 Other dorsalgia: Secondary | ICD-10-CM

## 2019-02-12 ENCOUNTER — Telehealth: Payer: Self-pay

## 2019-02-12 DIAGNOSIS — G894 Chronic pain syndrome: Secondary | ICD-10-CM

## 2019-02-12 DIAGNOSIS — M159 Polyosteoarthritis, unspecified: Secondary | ICD-10-CM

## 2019-02-12 NOTE — Telephone Encounter (Signed)
Please inform pharmacist of these indications for tramadol   Chronic Pain Syndrome Non-malignant chronic pain Generalized osteoarthritis of multiple sites  Thank you Merlinda Frederick.

## 2019-02-12 NOTE — Telephone Encounter (Signed)
Spoke with Pharm. She said that she would do it for 7 days at a time, and that she would work on it for her. Salvatore Marvel, CMA

## 2019-02-12 NOTE — Telephone Encounter (Signed)
[facility] calling about tramadol 50 mg Rx. Needs to know how to code this Rx for insurance for longer than a 7 day supply.  Is it for history of cancer? Please call [phone]. Ottis Stain, CMA

## 2019-02-19 DIAGNOSIS — I4891 Unspecified atrial fibrillation: Secondary | ICD-10-CM | POA: Diagnosis not present

## 2019-02-19 DIAGNOSIS — Z45018 Encounter for adjustment and management of other part of cardiac pacemaker: Secondary | ICD-10-CM

## 2019-02-19 DIAGNOSIS — Z950 Presence of cardiac pacemaker: Secondary | ICD-10-CM

## 2019-02-20 ENCOUNTER — Telehealth (INDEPENDENT_AMBULATORY_CARE_PROVIDER_SITE_OTHER): Payer: Medicare Other | Admitting: Family Medicine

## 2019-02-20 DIAGNOSIS — Z7409 Other reduced mobility: Secondary | ICD-10-CM | POA: Diagnosis not present

## 2019-02-20 DIAGNOSIS — Z789 Other specified health status: Secondary | ICD-10-CM | POA: Diagnosis not present

## 2019-02-20 DIAGNOSIS — R52 Pain, unspecified: Secondary | ICD-10-CM

## 2019-02-20 DIAGNOSIS — R54 Age-related physical debility: Secondary | ICD-10-CM

## 2019-02-20 DIAGNOSIS — R627 Adult failure to thrive: Secondary | ICD-10-CM | POA: Diagnosis not present

## 2019-02-20 DIAGNOSIS — R630 Anorexia: Secondary | ICD-10-CM

## 2019-02-20 NOTE — Telephone Encounter (Signed)
Pt's daughter called the office wanted to speak to Dr.McDiarmid concerning her mother, stated that pt has no appetite for the last 3weeks. Just wanted to consult. Best phone # to contact is [phone].

## 2019-02-20 NOTE — Telephone Encounter (Signed)
Will forward to MD to advise. [name],CMA  

## 2019-02-21 NOTE — Telephone Encounter (Signed)
See above telephone call. Please advise.

## 2019-02-21 NOTE — Telephone Encounter (Signed)
Patient's daughter called me stating that she has not been eating for several days, blood pressure has been following down and heart rate has been fixed at 60 bpm.  Still responsive and responds appropriately but does not want to eat and feels extremely weak and fatigued.  I discussed with her regarding end-of-life issues.  It may be that she is taking a natural course with lack of appetite, eventually which will lead to dehydration, altered mental status and death.  We discussed frankly regarding how to proceed, we also discussed regarding tube feeding as an option to avoid dehydration.  I have recommended strongly to consider hospice which I will be happy to set up, will also send my note to Dr. McDiarmid as well.  Doubt that she has acute illness that is making her do what she is doing, over the past few months she has lost weight, has been having lack of appetite and failure to thrive in an adult.  This was a 25-minute encounter regarding end-of-life issues, in view of COVID-19, no forms have been filled but they will proceed with making the patient DNR. Her daughter will also discuss regarding options of home hospice care with her brother. I offered her to fill the MOST forms.   Advised her to stop metoprolol and Eliquis.   Adrian Prows, MD, [facility] 02/21/2019, [place] PM [facility]. [place] Pager: [phone] Office: [phone] If no answer Cell [phone]

## 2019-02-22 ENCOUNTER — Other Ambulatory Visit (INDEPENDENT_AMBULATORY_CARE_PROVIDER_SITE_OTHER): Payer: Medicare Other | Admitting: *Deleted

## 2019-02-22 ENCOUNTER — Telehealth: Payer: Self-pay | Admitting: Family Medicine

## 2019-02-22 DIAGNOSIS — R5381 Other malaise: Secondary | ICD-10-CM

## 2019-02-22 DIAGNOSIS — R300 Dysuria: Secondary | ICD-10-CM

## 2019-02-22 DIAGNOSIS — R630 Anorexia: Secondary | ICD-10-CM

## 2019-02-22 DIAGNOSIS — N3090 Cystitis, unspecified without hematuria: Secondary | ICD-10-CM

## 2019-02-22 DIAGNOSIS — R432 Parageusia: Secondary | ICD-10-CM

## 2019-02-22 LAB — POCT URINALYSIS DIP (MANUAL ENTRY)
Bilirubin, UA: NEGATIVE
Glucose, UA: NEGATIVE mg/dL
Ketones, POC UA: NEGATIVE mg/dL
Nitrite, UA: NEGATIVE
Protein Ur, POC: NEGATIVE mg/dL
Spec Grav, UA: 1.01 (ref 1.010–1.025)
Urobilinogen, UA: 0.2 E.U./dL
pH, UA: 7 (ref 5.0–8.0)

## 2019-02-22 LAB — POCT UA - MICROSCOPIC ONLY

## 2019-02-22 MED ORDER — CEPHALEXIN 500 MG PO CAPS: 500.0000 mg | ORAL_CAPSULE | Freq: Two times a day (BID) | ORAL | 0 refills | Status: AC

## 2019-02-22 NOTE — Telephone Encounter (Signed)
I spoke with Jordan [name]'s daughter, Jordan [name], about her mother's decline in eating, lower blood pressure, and loss of taste. (+) change in urine color. Increase LBP. No abdominal pain. No N/V.  No fever/cough. No known COVID exposures.  Jordan [name] has had non-specific symptoms in past such as these with improvement after treatment with oral antibiotics for possible UTI.  Jordan [name]'s urine sample delivered today to [facility] lab showed (+2) LE, neg Nit  A/ Decreasing oral intake.     Possible UTI     Frailty     Hospice qualifying diagnosis of Dementia and cachexia                 - PPS 30 with progressive debility, BMI < 22%, lost ambulatory ability, declining enteral intake                 - In my opinion, Jordan [name] has less than 80-months prognosis for survival      DNR (+) status established recently by Jordan [name] (Card) in discussion with Jordan [name]'s HCPOA agent, Jordan [name].   P/ After discussion with dgt, we agreed to a trial of oral antibiotics directed at a possible UTI.      Rx Keflex 500 mg BID x 7 days      Will revisit her condition next week in 3 to 4 days.       If not improved, will get some basic biophysical profile labs.  Would try to have a Jordan [name] collect the blood at pt's home since it requires a taxing and considerable effort for Jordan Ruppert to leave her home.  Jordan [name] wo;; take her mother for COVID-19 testing next week for symptom of loss of taste next weekdays at Jordan [name].  Order for test placed.       If no reversible condition found, then Jordan [name] is willing to talk with hospice for consideration of enrollment of her mother.

## 2019-02-22 NOTE — Telephone Encounter (Signed)
Pt daughter is calling and would like to know if Dr. Wendy Poet or [name] could call her to discuss issues her mom is having with possible UTI. She said she spoke with Cardiologist yesterday and that he was suppose to be send over some information on pt.   She would like to know there could be labs ordered for pt today and if they could take a urine sample.   Please call pt's daughter to discuss.

## 2019-02-22 NOTE — Telephone Encounter (Signed)
Ulice Dash - thank you for keeping me in the loop.  Sherren Mocha

## 2019-02-22 NOTE — Telephone Encounter (Signed)
Daughter calling again wondering why she hasn't gotten a call back from [facility] or Dr. McDiarmid. Told daughter the message was sent back less than 2 hours ago and we ask for pt's to allow 24-48 hours for a response to these messages.

## 2019-02-22 NOTE — Telephone Encounter (Signed)
Patient states that mother has been experiencing fatigue and urinary odor (strong).  Daughter would like to bring a sample by for [place] to run.  She has been experiencing low BPs 115/40 and Dr. Nadyne Coombes is aware of this.  She is concerned for sepsis and possible infection or dehydration.  She would like to possibly get labs drawn too if this can happen next week and then she could make an appt to be seen.  Will forward to MD. Johnney Ou

## 2019-02-22 NOTE — Telephone Encounter (Signed)
Will forward to MD to advise. [name],CMA  

## 2019-02-24 ENCOUNTER — Encounter: Payer: Self-pay | Admitting: Cardiology

## 2019-03-12 ENCOUNTER — Other Ambulatory Visit: Payer: Self-pay | Admitting: Family Medicine

## 2019-03-12 DIAGNOSIS — G609 Hereditary and idiopathic neuropathy, unspecified: Secondary | ICD-10-CM

## 2019-03-12 DIAGNOSIS — M1A0210 Idiopathic chronic gout, right elbow, without tophus (tophi): Secondary | ICD-10-CM

## 2019-03-13 ENCOUNTER — Other Ambulatory Visit: Payer: Self-pay | Admitting: Family Medicine

## 2019-04-04 ENCOUNTER — Other Ambulatory Visit: Payer: Self-pay | Admitting: Cardiology

## 2019-05-05 ENCOUNTER — Other Ambulatory Visit: Payer: Self-pay | Admitting: Cardiology

## 2019-05-06 ENCOUNTER — Other Ambulatory Visit: Payer: Self-pay | Admitting: Family Medicine

## 2019-05-06 DIAGNOSIS — E440 Moderate protein-calorie malnutrition: Secondary | ICD-10-CM

## 2019-05-11 ENCOUNTER — Other Ambulatory Visit: Payer: Self-pay | Admitting: Family Medicine

## 2019-05-20 ENCOUNTER — Other Ambulatory Visit: Payer: Self-pay | Admitting: Family Medicine

## 2019-05-20 DIAGNOSIS — N3946 Mixed incontinence: Secondary | ICD-10-CM

## 2019-05-21 DIAGNOSIS — Z95818 Presence of other cardiac implants and grafts: Secondary | ICD-10-CM

## 2019-05-21 DIAGNOSIS — I442 Atrioventricular block, complete: Secondary | ICD-10-CM

## 2019-05-21 DIAGNOSIS — Z4509 Encounter for adjustment and management of other cardiac device: Secondary | ICD-10-CM

## 2019-05-22 ENCOUNTER — Telehealth: Payer: Self-pay | Admitting: Family Medicine

## 2019-05-22 NOTE — Telephone Encounter (Signed)
I informed [name] that I copied her brother's [facility] 34 F form's section on describing his services he will provide to his mother. [name] had no objection.   [name] asks that we contact her when it is time to pick up the completed form.

## 2019-05-22 NOTE — Telephone Encounter (Signed)
Daughter Jordan [name] was informed that form is ready for pick up. Jordan [name],CMA

## 2019-05-22 NOTE — Telephone Encounter (Signed)
I have completed and signed [place] document that was documented presumably by the employer's [employer].  Please contact Ms Jillene Bucks, probably best through her mobile phone listed in Ms Finkel's phone contacts, to let her know the document is ready for pick up.

## 2019-05-29 ENCOUNTER — Telehealth: Payer: Self-pay

## 2019-05-29 NOTE — Telephone Encounter (Signed)
Pt's daughter calls nurse line regarding concerns for BP. Daughter reports systolic BP range 546-270 and diastolic 35-00 over last 3-4 weeks. Daughter is concerned about diastolic BP readings. States that patient has been asymptomatic with these readings.She just wanted to make PCP aware of the lower BP readings.  Also, daughter reports odor in urine as well. Offered to make appointment to be evaluated. Daughter states that she would rather not bring mother in if possible.   To PCP. Please advise  Talbot Grumbling, RN

## 2019-06-10 ENCOUNTER — Other Ambulatory Visit: Payer: Self-pay | Admitting: *Deleted

## 2019-06-10 DIAGNOSIS — F329 Major depressive disorder, single episode, unspecified: Secondary | ICD-10-CM

## 2019-06-10 DIAGNOSIS — M5489 Other dorsalgia: Secondary | ICD-10-CM

## 2019-06-10 DIAGNOSIS — F32A Depression, unspecified: Secondary | ICD-10-CM

## 2019-06-10 MED ORDER — MIRTAZAPINE 15 MG PO TABS: 15.0000 mg | ORAL_TABLET | Freq: Every day | ORAL | 11 refills | Status: AC

## 2019-06-10 NOTE — Telephone Encounter (Signed)
Per pharmacy, patient's insurance plan only allows for 1 tablet per day for the 7.5mg  dose.  Since patient is using 15mg  daily, please send a new script to reflect just 1 tablet of 15mg  once a day.  Ryenne Lynam,CMA

## 2019-06-13 ENCOUNTER — Telehealth: Payer: Self-pay

## 2019-06-13 NOTE — Telephone Encounter (Signed)
Patient's daughter calls nurse line with questions regarding COVID vaccine. Went over algorithm with daughter, patient has no significant allergies and no contraindications. Recommended patient to receive first COVID vaccination. Advised daughter to follow up with PCP if patient experiences any adverse reaction to initial vaccine.   To PCP  Talbot Grumbling, RN

## 2019-06-23 ENCOUNTER — Other Ambulatory Visit: Payer: Self-pay | Admitting: Cardiology

## 2019-06-25 ENCOUNTER — Other Ambulatory Visit: Payer: Self-pay

## 2019-06-25 MED ORDER — ELIQUIS 2.5 MG PO TABS: 2.5000 mg | ORAL_TABLET | Freq: Two times a day (BID) | ORAL | 2 refills | Status: DC

## 2019-07-08 ENCOUNTER — Telehealth: Payer: Self-pay

## 2019-07-08 NOTE — Telephone Encounter (Signed)
PA required for Gabapentin 100mg . I called the pharmacy and insurance does not allow more than (3) 100mg  per day. Patient takes (2) 100mg  BID. Please advise.

## 2019-07-09 NOTE — Telephone Encounter (Signed)
Page- Please request prior approval for gabapentin Will prescribe 200 ing morning, 100 mg in afternoon and 100 mg at bedtime.  Thank you

## 2019-07-16 ENCOUNTER — Other Ambulatory Visit: Payer: Self-pay

## 2019-07-16 NOTE — Telephone Encounter (Signed)
Disregard

## 2019-07-19 NOTE — Telephone Encounter (Signed)
Received fax from pharmacy, PA needed on gabapentin 100mg .  Clinical questions submitted via Cover My Meds.  Waiting on response, could take up to 72 hours.  Cover My Meds info: Key: [id number]  Christen Bame, CMA

## 2019-07-19 NOTE — Telephone Encounter (Signed)
    Pharmacy informed. Christen Bame, CMA

## 2019-08-05 ENCOUNTER — Telehealth: Payer: Self-pay | Admitting: Family Medicine

## 2019-08-05 NOTE — Telephone Encounter (Signed)
Pt's daughter is calling mother's blood pressure is low multiple times one reading was 135/41. Daughter is wondering if this is something to be concerned about or if theres anything that can be done. Please advise pt thanks.

## 2019-08-06 ENCOUNTER — Other Ambulatory Visit: Payer: Self-pay | Admitting: Family Medicine

## 2019-08-06 NOTE — Telephone Encounter (Signed)
I spoke with Jordan [name] about her mother's lower diastolic BP. She did not report her mother expressing ischemic symptoms. Given that the mean arterial pressure seems to be above 60 mmHg, and the DBP is the primary reason for the increased pulse pressure, rather than an increased SBP, I am less concerned that this lower DBP is clinically significant.   Jordan [name] will monitor her mother's mean arterial pressure to see if it stays above 60 mmHg most of the time.

## 2019-08-16 ENCOUNTER — Telehealth: Payer: Self-pay

## 2019-08-16 DIAGNOSIS — L89309 Pressure ulcer of unspecified buttock, unspecified stage: Secondary | ICD-10-CM

## 2019-08-16 NOTE — Telephone Encounter (Signed)
Patients daughter calls nurse line requesting a DME order for silicone pads for patients skin. Jordan [name] stated she is spending a lot of time in the bed and on the couch and would like a protective barrier. Jordan [name] would like this processed through [employer] so her medicaid will pay. Jordan [name] also requesting home health to come and check on patients skin for new breakdown or wound dressing if appropriate. They do have a scheduled video call for 4/22. Please advise.

## 2019-08-18 ENCOUNTER — Other Ambulatory Visit: Payer: Self-pay | Admitting: Family Medicine

## 2019-08-18 DIAGNOSIS — G609 Hereditary and idiopathic neuropathy, unspecified: Secondary | ICD-10-CM

## 2019-08-20 ENCOUNTER — Telehealth: Payer: Self-pay | Admitting: Family Medicine

## 2019-08-20 NOTE — Telephone Encounter (Signed)
Daughter is calling to inquire about the home health referral for skin breakdown assessment.  Also she would like to get some supplies ordered for the pressure point areas.  Will forward to MD. Johnney Ou

## 2019-08-20 NOTE — Telephone Encounter (Signed)
See phone note from 08-16-19.  Edmar Blankenburg,CMA

## 2019-08-20 NOTE — Telephone Encounter (Signed)
I spoke with [name] about her mother. Her mother has a superficial wound on posterior of ? Right thigh that appears to be healing.  She is requesting a home health nurse come out to help them treat the wound.  Unfortunately, it has been well over 6 months since [name]'s last Face-to-Face visit in our office, so Medicare would not cover a home health visit.  [name] is unable to bring her mother to my clinics,

## 2019-08-20 NOTE — Telephone Encounter (Deleted)
Please let [name] know that Dr Viki Carrera has ordered a nurse visit to her mother to assess her wounds and make recommendations.

## 2019-08-20 NOTE — Addendum Note (Signed)
Addended by: Lissa Morales D on: 08/20/2019 04:26 PM   Modules accepted: Orders

## 2019-08-20 NOTE — Telephone Encounter (Signed)
Daughter is asking doctor to call her regarding home health request  [phone]

## 2019-08-21 ENCOUNTER — Other Ambulatory Visit: Payer: Self-pay | Admitting: *Deleted

## 2019-08-21 DIAGNOSIS — N3946 Mixed incontinence: Secondary | ICD-10-CM

## 2019-08-22 ENCOUNTER — Telehealth: Payer: Medicare Other | Admitting: Family Medicine

## 2019-08-22 ENCOUNTER — Ambulatory Visit: Payer: Medicare Other

## 2019-08-22 MED ORDER — FESOTERODINE FUMARATE ER 4 MG PO TB24: 4.0000 mg | ORAL_TABLET | Freq: Every day | ORAL | 99 refills | Status: DC

## 2019-08-26 ENCOUNTER — Other Ambulatory Visit: Payer: Self-pay

## 2019-08-26 ENCOUNTER — Ambulatory Visit: Payer: Medicare Other

## 2019-08-26 ENCOUNTER — Telehealth (INDEPENDENT_AMBULATORY_CARE_PROVIDER_SITE_OTHER): Payer: Medicare Other | Admitting: Family Medicine

## 2019-08-26 VITALS — BP 164/50 | HR 74 | Temp 96.5°F

## 2019-08-26 DIAGNOSIS — L89211 Pressure ulcer of right hip, stage 1: Secondary | ICD-10-CM | POA: Diagnosis not present

## 2019-08-26 DIAGNOSIS — M545 Low back pain: Secondary | ICD-10-CM | POA: Diagnosis not present

## 2019-08-26 DIAGNOSIS — M546 Pain in thoracic spine: Secondary | ICD-10-CM

## 2019-08-26 DIAGNOSIS — G8929 Other chronic pain: Secondary | ICD-10-CM

## 2019-08-26 DIAGNOSIS — L89899 Pressure ulcer of other site, unspecified stage: Secondary | ICD-10-CM | POA: Insufficient documentation

## 2019-08-26 DIAGNOSIS — L8990 Pressure ulcer of unspecified site, unspecified stage: Secondary | ICD-10-CM | POA: Insufficient documentation

## 2019-08-26 HISTORY — DX: Pressure ulcer of other site, unspecified stage: L89.899

## 2019-08-26 NOTE — Assessment & Plan Note (Signed)
Patient with pressure ulcer behind right thigh.  Difficulty stage as this was a video visit.  Do think home health would assist with patient care.  Have placed home health referral.  Follow-up if no improvement.

## 2019-08-26 NOTE — Progress Notes (Signed)
[facility] Telemedicine Visit I connected with  Jordan [name] on 08/26/19 by a video enabled telemedicine application and verified that I am speaking with the correct person using two identifiers.   I discussed the limitations of evaluation and management by telemedicine. The patient expressed understanding and agreed to proceed.   Patient consented to have virtual visit and was identified by name and date of birth. Method of visit: Video  Encounter participants: Patient: Jordan [name] - located at home Provider: Caroline More - located at [facility] Others (if applicable): [name] Ruales Engineer, maintenance)   Chief Complaint: need for home health   HPI:  Wound Patients daughter provided translation. Chose not to use formal interpretor   All of a sudden patient had wound in her leg, back of her thigh. She thinks she may have had an insect bite. Started as 1 inch in length and 0.5 inch in width. Was starting to heal. Reports lesion has been present x2 weeks. Not warm to touch. Not draining any. No fevers. Has been using mupiricin ointment but thinks it is expired. Also using neosporin with a bandage. Patient does sit most of the day or lays down.   Back pain  Patient would also like silicone pads for her back. Her back bothers her when she lays down. Has used silicone pads during previous hospitalization. Reports low back pain around the coccyx after laying down.   ROS: per HPI  Pertinent PMHx: a fib, HTN, pacemaker,   Exam:  BP (!) 164/50   Pulse 74   Temp (!) 96.5 F (35.8 C) (Oral)   SpO2 94%   Respiratory: speaking full sentences, no increased WOB Skin:  Assessment/Plan:  Chronic bilateral thoracic back pain Patient with chronic back pain.  Appears to worsen after laying for long peers of time.  Have placed a DME order for silicone back pad to help alleviate some pressure.  Sent message to RN team discussing DME order.  Pressure ulcer Patient with pressure ulcer  behind right thigh.  Difficulty stage as this was a video visit.  Do think home health would assist with patient care.  Have placed home health referral.  Follow-up if no improvement.    Time spent during visit with patient: 20 minutes

## 2019-08-26 NOTE — Assessment & Plan Note (Signed)
Patient with chronic back pain.  Appears to worsen after laying for long peers of time.  Have placed a DME order for silicone back pad to help alleviate some pressure.  Sent message to RN team discussing DME order.

## 2019-08-28 ENCOUNTER — Telehealth: Payer: Self-pay

## 2019-08-28 DIAGNOSIS — K219 Gastro-esophageal reflux disease without esophagitis: Secondary | ICD-10-CM | POA: Diagnosis not present

## 2019-08-28 DIAGNOSIS — G8929 Other chronic pain: Secondary | ICD-10-CM | POA: Diagnosis not present

## 2019-08-28 DIAGNOSIS — M546 Pain in thoracic spine: Secondary | ICD-10-CM | POA: Diagnosis not present

## 2019-08-28 DIAGNOSIS — M109 Gout, unspecified: Secondary | ICD-10-CM | POA: Diagnosis not present

## 2019-08-28 DIAGNOSIS — E039 Hypothyroidism, unspecified: Secondary | ICD-10-CM | POA: Diagnosis not present

## 2019-08-28 DIAGNOSIS — Z7901 Long term (current) use of anticoagulants: Secondary | ICD-10-CM | POA: Diagnosis not present

## 2019-08-28 DIAGNOSIS — M545 Low back pain: Secondary | ICD-10-CM | POA: Diagnosis not present

## 2019-08-28 DIAGNOSIS — L89891 Pressure ulcer of other site, stage 1: Secondary | ICD-10-CM | POA: Diagnosis not present

## 2019-08-28 NOTE — Telephone Encounter (Signed)
Sharyn Lull, home health nurse, LVM on nurse line requesting VO as follows.  1x a week for 9 weeks. Medication management and wound care.  You may leave VO on her secure VM [phone].

## 2019-08-29 NOTE — Telephone Encounter (Signed)
Spoke with Sharyn Lull, [facility] nurse, [phone]. Gave authorization order for [facility] nurse 1x week or 9 weeks for medication management and wound care.

## 2019-09-04 ENCOUNTER — Telehealth: Payer: Self-pay

## 2019-09-04 DIAGNOSIS — M109 Gout, unspecified: Secondary | ICD-10-CM | POA: Diagnosis not present

## 2019-09-04 DIAGNOSIS — E039 Hypothyroidism, unspecified: Secondary | ICD-10-CM | POA: Diagnosis not present

## 2019-09-04 DIAGNOSIS — M546 Pain in thoracic spine: Secondary | ICD-10-CM | POA: Diagnosis not present

## 2019-09-04 DIAGNOSIS — G8929 Other chronic pain: Secondary | ICD-10-CM | POA: Diagnosis not present

## 2019-09-04 DIAGNOSIS — M545 Low back pain: Secondary | ICD-10-CM | POA: Diagnosis not present

## 2019-09-04 DIAGNOSIS — L89891 Pressure ulcer of other site, stage 1: Secondary | ICD-10-CM | POA: Diagnosis not present

## 2019-09-04 NOTE — Telephone Encounter (Signed)
Amber from [facility] calling for PT verbal orders as follows:  1 time(s) weekly for 1 week(s), then 2 time(s) weekly for 7 week(s)  You can leave verbal orders on confidential voicemail at [phone].   Talbot Grumbling, RN

## 2019-09-05 ENCOUNTER — Other Ambulatory Visit: Payer: Self-pay | Admitting: Family Medicine

## 2019-09-05 ENCOUNTER — Telehealth: Payer: Self-pay

## 2019-09-05 DIAGNOSIS — G8929 Other chronic pain: Secondary | ICD-10-CM | POA: Diagnosis not present

## 2019-09-05 DIAGNOSIS — M545 Low back pain: Secondary | ICD-10-CM | POA: Diagnosis not present

## 2019-09-05 DIAGNOSIS — E039 Hypothyroidism, unspecified: Secondary | ICD-10-CM | POA: Diagnosis not present

## 2019-09-05 DIAGNOSIS — L89891 Pressure ulcer of other site, stage 1: Secondary | ICD-10-CM | POA: Diagnosis not present

## 2019-09-05 DIAGNOSIS — M109 Gout, unspecified: Secondary | ICD-10-CM | POA: Diagnosis not present

## 2019-09-05 DIAGNOSIS — M546 Pain in thoracic spine: Secondary | ICD-10-CM | POA: Diagnosis not present

## 2019-09-05 NOTE — Telephone Encounter (Signed)
Sharyn Lull, RN with [facility] calls nurse line regarding patient concerns. Per home health nurse patient's daughter reports increased generalized fatigue and body aches. Daughter is concerned that patient has potential UTI. Patient currently does not have any symptoms of UTI.   [facility] RN states that at next visit they can collect a sample if needed.   Sharyn Lull can be contacted at [phone]  To PCP  Please advise  Talbot Grumbling, RN

## 2019-09-06 NOTE — Telephone Encounter (Signed)
Called [facility] PT authorization orders to Amber at [phone] [facility] PT 1 x per wk x 1 wk HHPT 2 x per wk for 7 wks

## 2019-09-09 ENCOUNTER — Telehealth: Payer: Self-pay | Admitting: *Deleted

## 2019-09-09 DIAGNOSIS — M545 Low back pain: Secondary | ICD-10-CM | POA: Diagnosis not present

## 2019-09-09 DIAGNOSIS — E039 Hypothyroidism, unspecified: Secondary | ICD-10-CM | POA: Diagnosis not present

## 2019-09-09 DIAGNOSIS — M546 Pain in thoracic spine: Secondary | ICD-10-CM | POA: Diagnosis not present

## 2019-09-09 DIAGNOSIS — L89891 Pressure ulcer of other site, stage 1: Secondary | ICD-10-CM | POA: Diagnosis not present

## 2019-09-09 DIAGNOSIS — G8929 Other chronic pain: Secondary | ICD-10-CM | POA: Diagnosis not present

## 2019-09-09 DIAGNOSIS — M109 Gout, unspecified: Secondary | ICD-10-CM | POA: Diagnosis not present

## 2019-09-09 NOTE — Telephone Encounter (Signed)
[facility] will process now. Christen Bame, CMA

## 2019-09-09 NOTE — Telephone Encounter (Signed)
Community message sent to [employer] and Darlina Guys @ [facility] to process DME order for back pad.   Christen Bame, CMA

## 2019-09-11 ENCOUNTER — Telehealth: Payer: Self-pay

## 2019-09-11 DIAGNOSIS — L89891 Pressure ulcer of other site, stage 1: Secondary | ICD-10-CM | POA: Diagnosis not present

## 2019-09-11 DIAGNOSIS — M109 Gout, unspecified: Secondary | ICD-10-CM | POA: Diagnosis not present

## 2019-09-11 DIAGNOSIS — F0280 Dementia in other diseases classified elsewhere without behavioral disturbance: Secondary | ICD-10-CM

## 2019-09-11 DIAGNOSIS — G8929 Other chronic pain: Secondary | ICD-10-CM | POA: Diagnosis not present

## 2019-09-11 DIAGNOSIS — M545 Low back pain: Secondary | ICD-10-CM | POA: Diagnosis not present

## 2019-09-11 DIAGNOSIS — L8990 Pressure ulcer of unspecified site, unspecified stage: Secondary | ICD-10-CM

## 2019-09-11 DIAGNOSIS — E039 Hypothyroidism, unspecified: Secondary | ICD-10-CM | POA: Diagnosis not present

## 2019-09-11 DIAGNOSIS — M159 Polyosteoarthritis, unspecified: Secondary | ICD-10-CM

## 2019-09-11 DIAGNOSIS — M546 Pain in thoracic spine: Secondary | ICD-10-CM | POA: Diagnosis not present

## 2019-09-11 NOTE — Telephone Encounter (Signed)
Patient's daughter calls nurse line requesting order for silicon pads for patient's back. Daughter is concerned about skin breakdown and would like to have these for her care.   To PCP  Please advise  Talbot Grumbling, RN

## 2019-09-12 NOTE — Telephone Encounter (Signed)
Silicon pad ordered.  Will connect with CCM to help process the order.  Jarrett Soho, please let her daughter know that CCM will reach out to them soon.  Thanks.

## 2019-09-13 ENCOUNTER — Telehealth: Payer: Self-pay | Admitting: Family Medicine

## 2019-09-13 DIAGNOSIS — M545 Low back pain: Secondary | ICD-10-CM | POA: Diagnosis not present

## 2019-09-13 DIAGNOSIS — G8929 Other chronic pain: Secondary | ICD-10-CM | POA: Diagnosis not present

## 2019-09-13 DIAGNOSIS — M546 Pain in thoracic spine: Secondary | ICD-10-CM | POA: Diagnosis not present

## 2019-09-13 DIAGNOSIS — L89891 Pressure ulcer of other site, stage 1: Secondary | ICD-10-CM | POA: Diagnosis not present

## 2019-09-13 DIAGNOSIS — M109 Gout, unspecified: Secondary | ICD-10-CM | POA: Diagnosis not present

## 2019-09-13 DIAGNOSIS — E039 Hypothyroidism, unspecified: Secondary | ICD-10-CM | POA: Diagnosis not present

## 2019-09-13 NOTE — Chronic Care Management (AMB) (Signed)
  Chronic Care Management   Note  09/13/2019 Name: Jordan [name] MRN: [id number] DOB: 09-Aug-1919  Jordan [name] is a 86 y.o. year old female who is a primary care patient of McDiarmid, Blane Ohara, MD. I reached out to [name] by phone today in response to a referral sent by Jordan [name]'s PCP, Dr. Andrena Mews     Ms. Acheampong was given information about Chronic Care Management services today including:  1. CCM service includes personalized support from designated clinical staff supervised by her physician, including individualized plan of care and coordination with other care providers 2. 24/7 contact phone numbers for assistance for urgent and routine care needs. 3. Service will only be billed when office clinical staff spend 20 minutes or more in a month to coordinate care. 4. Only one practitioner may furnish and bill the service in a calendar month. 5. The patient may stop CCM services at any time (effective at the end of the month) by phone call to the office staff. 6. The patient will be responsible for cost sharing (co-pay) of up to 20% of the service fee (after annual deductible is met).  Patient's daughte agreed to services and verbal consent obtained.   Follow up plan: Telephone appointment with care management team member scheduled for:09/18/2019  Glenna Durand, LPN Health Advisor, [facility] Management ??[email or web address] ??[phone]

## 2019-09-17 DIAGNOSIS — E039 Hypothyroidism, unspecified: Secondary | ICD-10-CM | POA: Diagnosis not present

## 2019-09-17 DIAGNOSIS — L89891 Pressure ulcer of other site, stage 1: Secondary | ICD-10-CM | POA: Diagnosis not present

## 2019-09-17 DIAGNOSIS — M545 Low back pain: Secondary | ICD-10-CM | POA: Diagnosis not present

## 2019-09-17 DIAGNOSIS — M546 Pain in thoracic spine: Secondary | ICD-10-CM | POA: Diagnosis not present

## 2019-09-17 DIAGNOSIS — M109 Gout, unspecified: Secondary | ICD-10-CM | POA: Diagnosis not present

## 2019-09-17 DIAGNOSIS — G8929 Other chronic pain: Secondary | ICD-10-CM | POA: Diagnosis not present

## 2019-09-18 ENCOUNTER — Ambulatory Visit: Payer: Medicare Other

## 2019-09-18 ENCOUNTER — Other Ambulatory Visit: Payer: Self-pay

## 2019-09-19 DIAGNOSIS — E039 Hypothyroidism, unspecified: Secondary | ICD-10-CM | POA: Diagnosis not present

## 2019-09-19 DIAGNOSIS — L89891 Pressure ulcer of other site, stage 1: Secondary | ICD-10-CM | POA: Diagnosis not present

## 2019-09-19 DIAGNOSIS — G8929 Other chronic pain: Secondary | ICD-10-CM | POA: Diagnosis not present

## 2019-09-19 DIAGNOSIS — M546 Pain in thoracic spine: Secondary | ICD-10-CM | POA: Diagnosis not present

## 2019-09-19 DIAGNOSIS — M545 Low back pain: Secondary | ICD-10-CM | POA: Diagnosis not present

## 2019-09-19 DIAGNOSIS — M109 Gout, unspecified: Secondary | ICD-10-CM | POA: Diagnosis not present

## 2019-09-20 ENCOUNTER — Ambulatory Visit: Payer: Self-pay

## 2019-09-20 DIAGNOSIS — M546 Pain in thoracic spine: Secondary | ICD-10-CM | POA: Diagnosis not present

## 2019-09-20 DIAGNOSIS — M545 Low back pain: Secondary | ICD-10-CM | POA: Diagnosis not present

## 2019-09-20 DIAGNOSIS — G8929 Other chronic pain: Secondary | ICD-10-CM | POA: Diagnosis not present

## 2019-09-20 DIAGNOSIS — M109 Gout, unspecified: Secondary | ICD-10-CM | POA: Diagnosis not present

## 2019-09-20 DIAGNOSIS — L89891 Pressure ulcer of other site, stage 1: Secondary | ICD-10-CM | POA: Diagnosis not present

## 2019-09-20 DIAGNOSIS — E039 Hypothyroidism, unspecified: Secondary | ICD-10-CM | POA: Diagnosis not present

## 2019-09-20 NOTE — Patient Instructions (Signed)
Visit Information  Goals Addressed            This Visit's Progress   . I need silicon pads for my back (pt-stated)       CARE PLAN ENTRY (see longitudinal plan of care for additional care plan information)  Current Barriers:  . Care Coordination needs related to DME in a patient with Malnutrition, frailty and Unintentional weight loss.  Nurse Case Manager Clinical Goal(s):  Marland Kitchen Over the next 14 days, patient will verbalize understanding of plan for silicon adhesive pads  Interventions:  . Inter-disciplinary care team collaboration (see longitudinal plan of care) . Evaluation of current treatment plan related to silicon pads and patient's adherence to plan as established by provider. . Advised patient advised the patient that I would contact [employer]  [facility] to find out any information on the silicon pads . Discussed plans with patient for ongoing care management follow up and provided patient with direct contact information for care management team . Daughter states that due to her mother's fraile nature she uses the pads to put on her coccyx to help deter any skin breakdown.  Patient Self Care Activities:  . Patient/daughter verbalizes understanding of plan  . Attends all scheduled provider appointments . Calls provider office for new concerns or questions . Unable to independently self manage self navigation for DME  Initial goal documentation        Jordan [name] was given information about Care Management services today including:  1. Care Management services include personalized support from designated clinical staff supervised by her physician, including individualized plan of care and coordination with other care providers 2. 24/7 contact phone numbers for assistance for urgent and routine care needs. 3. The patient may stop CCM services at any time (effective at the end of the month) by phone call to the office staff.  Patient agreed to services and verbal consent obtained.    The patient verbalized understanding of instructions provided today and declined a print copy of patient instruction materials.   The patient has been provided with contact information for the care management team and has been advised to call with any health related questions or concerns.  RNCM will follow up with the patient within the next.week.  Lazaro Arms RN, BSN, [facility] Care Management Coordinator [place] Phone: [phone] Fax: [phone]

## 2019-09-20 NOTE — Chronic Care Management (AMB) (Signed)
  Care Management   Follow Up Note   09/20/2019 Name: Jordan [name] MRN: [id number] DOB: 04/21/1920  Referred by: McDiarmid, Blane Ohara, MD Reason for referral : Care Coordination (Care Management RNCM Silicon pads)   Jordan [name] is a 86 y.o. year old female who is a primary care patient of McDiarmid, Blane Ohara, MD. The care management team was consulted for assistance with care management and care coordination needs.    Review of patient status, including review of consultants reports, relevant laboratory and other test results, and collaboration with appropriate care team members and the patient's provider was performed as part of comprehensive patient evaluation and provision of chronic care management services.    SDOH (Social Determinants of Health) assessments performed: No See Care Plan activities for detailed interventions related to [facility])     Advanced Directives: See Care Plan and Vynca application for related entries.   Goals Addressed            This Visit's Progress   . I need silicon pads for my back (pt-stated)       CARE PLAN ENTRY (see longitudinal plan of care for additional care plan information)  Current Barriers:  . Care Coordination needs related to DME in a patient with Malnutrition, frailty and Unintentional weight loss.  Nurse Case Manager Clinical Goal(s):  Marland Kitchen Over the next 14 days, patient will verbalize understanding of plan for silicon adhesive pads  Interventions:  . Inter-disciplinary care team collaboration (see longitudinal plan of care) . Evaluation of current treatment plan related to silicon pads and patient's adherence to plan as established by provider. . Advised patient advised the patient that I would contact [employer]  [facility] to find out any information on the silicon pads . Discussed plans with patient for ongoing care management follow up and provided patient with direct contact information for care management team . Daughter states that due to her  mother's fraile nature she uses the pads to put on her coccyx to help deter any skin breakdown. . 09/20/19 . Talked with Ronalee Belts with [facility] regarding silicon pads and they do not carry the brand but could have some similar.  But the patient would have today for them out of pocket .  The insurance will not pay for then unless the patient has a wound. Marland Kitchen Spoke with Renold Genta with [employer]  and they do not carry that DME . Called the daughter and informed her of the information.  She stated that her mother is in the caps program and her case worker stated that if she can get a prescription for the pads that they maybe able to find some money to help her obtain the pads for her mother.  I told her that I will send a note to Dr McDiarmid.  Patient Self Care Activities:  . Patient/daughter verbalizes understanding of plan  . Attends all scheduled provider appointments . Calls provider office for new concerns or questions . Unable to independently self manage self navigation for DME  Please see past updates related to this goal by clicking on the "Past Updates" button in the selected goal          RNCM will follow up with the patient within the next. Week   Lazaro Arms RN, BSN, [facility] Care Management Coordinator [place] Phone: [phone] Fax: [phone]

## 2019-09-20 NOTE — Chronic Care Management (AMB) (Signed)
Care Management   Initial Visit Note  09/20/2019 Name: Jordan [name] MRN: [id number] DOB: [birth year]   Assessment: Jordan [name] is a 86 y.o. year old female who sees McDiarmid, Blane Ohara, MD for primary care. The care management team was consulted for assistance with care management and care coordination needs related to Care Coordination for DME related to Malnutrition Frailty and weight loss unintetional.   Review of patient status, including review of consultants reports, relevant laboratory and other test results, and collaboration with appropriate care team members and the patient's provider was performed as part of comprehensive patient evaluation and provision of care management services.    SDOH (Social Determinants of Health) assessments performed: No See Care Plan activities for detailed interventions related to [facility])     Outpatient Encounter Medications as of 09/18/2019  Medication Sig  . metoprolol succinate (TOPROL-XL) 25 MG 24 hr tablet TAKE 1 TABLET(25 MG) BY MOUTH DAILY  . acetaminophen (TYLENOL) 650 MG CR tablet Take 650 mg by mouth 2 (two) times daily as needed for pain.  Marland Kitchen allopurinol (ZYLOPRIM) 100 MG tablet TAKE 1 TABLET(100 MG) BY MOUTH DAILY  . beta carotene w/minerals (OCUVITE) tablet Take 1 tablet by mouth daily.  . colchicine 0.6 MG tablet TAKE 1 TABLET BY MOUTH TWICE DAILY  . cycloSPORINE (RESTASIS) 0.05 % ophthalmic emulsion Place 1 drop into both eyes at bedtime.  . diclofenac Sodium (VOLTAREN) 1 % GEL SMARTSIG:2 Gram(s) Topical 4 Times Daily PRN  . docusate sodium (COLACE) 100 MG capsule Take 2 capsules (200 mg total) by mouth 2 (two) times daily.  Marland Kitchen ELIQUIS 2.5 MG TABS tablet Take 1 tablet (2.5 mg total) by mouth 2 (two) times daily.  . fesoterodine (TOVIAZ) 4 MG TB24 tablet Take 1 tablet (4 mg total) by mouth daily.  . furosemide (LASIX) 20 MG tablet TAKE 1 TABLET BY MOUTH DAILY AS NEEDED FOR SWELLING  . gabapentin (NEURONTIN) 100 MG capsule TAKE 2 CAPSULES BY MOUTH  TWICE DAILY  . levothyroxine (SYNTHROID) 100 MCG tablet TAKE 1 TABLET BY MOUTH ONCE DAILY  . lidocaine (XYLOCAINE) 5 % ointment Apply 1 application topically daily as needed.  . mirtazapine (REMERON) 15 MG tablet Take 1 tablet (15 mg total) by mouth at bedtime.  . Nutritional Supplement LIQD Take by mouth.  Marland Kitchen omeprazole (PRILOSEC) 20 MG capsule TAKE 1 CAPSULE(20 MG) BY MOUTH DAILY  . polyethylene glycol (MIRALAX / GLYCOLAX) packet take 17 grams by mouth once daily (Patient taking differently: daily as needed. take 17 grams by mouth once daily)  . traMADol (ULTRAM) 50 MG tablet TAKE 1/2 TO 1 TABLET(25 TO 50 MG) BY MOUTH EVERY 8 HOURS AS NEEDED FOR MODERATE PAIN  . TRAVATAN Z 0.004 % SOLN ophthalmic solution Place 1 drop into both eyes at bedtime.   . traZODone (DESYREL) 50 MG tablet TAKE 1 TABLET BY MOUTH AT BEDTIME  . vitamin B-12 (CYANOCOBALAMIN) 1000 MCG tablet Take 1,000 mcg by mouth daily.   No facility-administered encounter medications on file as of 09/18/2019.    Goals Addressed   None      Jordan [name] was given information about Care Management services today including:  1. Care Management services include personalized support from designated clinical staff supervised by a physician, including individualized plan of care and coordination with other care providers 2. 24/7 contact phone numbers for assistance for urgent and routine care needs. 3. The patient may stop Care Management services at any time (effective at the end of the month)  by phone call to the office staff.  Patient agreed to services and verbal consent obtained.  RNCM will follow up with the patient within the next.week.  Lazaro Arms RN, BSN, [facility] Care Management Coordinator [place] Phone: [phone] Fax: [phone]

## 2019-09-20 NOTE — Chronic Care Management (AMB) (Deleted)
Care Management   Initial Visit Note  09/20/2019 Name: Jordan [name] MRN: [id number] DOB: [birth year]   Assessment: Jordan [name] is a 86 y.o. year old female who sees McDiarmid, Blane Ohara, MD for primary care. The care management team was consulted for assistance with care management and care coordination needs related to Care Coordination for DME related to Malnutrition frailty and weight loss unintentional   .   Review of patient status, including review of consultants reports, relevant laboratory and other test results, and collaboration with appropriate care team members and the patient's provider was performed as part of comprehensive patient evaluation and provision of care management services.    SDOH (Social Determinants of Health) assessments performed: No See Care Plan activities for detailed interventions related to [facility])     Outpatient Encounter Medications as of 09/20/2019  Medication Sig  . metoprolol succinate (TOPROL-XL) 25 MG 24 hr tablet TAKE 1 TABLET(25 MG) BY MOUTH DAILY  . acetaminophen (TYLENOL) 650 MG CR tablet Take 650 mg by mouth 2 (two) times daily as needed for pain.  Marland Kitchen allopurinol (ZYLOPRIM) 100 MG tablet TAKE 1 TABLET(100 MG) BY MOUTH DAILY  . beta carotene w/minerals (OCUVITE) tablet Take 1 tablet by mouth daily.  . colchicine 0.6 MG tablet TAKE 1 TABLET BY MOUTH TWICE DAILY  . cycloSPORINE (RESTASIS) 0.05 % ophthalmic emulsion Place 1 drop into both eyes at bedtime.  . diclofenac Sodium (VOLTAREN) 1 % GEL SMARTSIG:2 Gram(s) Topical 4 Times Daily PRN  . docusate sodium (COLACE) 100 MG capsule Take 2 capsules (200 mg total) by mouth 2 (two) times daily.  Marland Kitchen ELIQUIS 2.5 MG TABS tablet Take 1 tablet (2.5 mg total) by mouth 2 (two) times daily.  . fesoterodine (TOVIAZ) 4 MG TB24 tablet Take 1 tablet (4 mg total) by mouth daily.  . furosemide (LASIX) 20 MG tablet TAKE 1 TABLET BY MOUTH DAILY AS NEEDED FOR SWELLING  . gabapentin (NEURONTIN) 100 MG capsule TAKE 2 CAPSULES BY  MOUTH TWICE DAILY  . levothyroxine (SYNTHROID) 100 MCG tablet TAKE 1 TABLET BY MOUTH ONCE DAILY  . lidocaine (XYLOCAINE) 5 % ointment Apply 1 application topically daily as needed.  . mirtazapine (REMERON) 15 MG tablet Take 1 tablet (15 mg total) by mouth at bedtime.  . Nutritional Supplement LIQD Take by mouth.  Marland Kitchen omeprazole (PRILOSEC) 20 MG capsule TAKE 1 CAPSULE(20 MG) BY MOUTH DAILY  . polyethylene glycol (MIRALAX / GLYCOLAX) packet take 17 grams by mouth once daily (Patient taking differently: daily as needed. take 17 grams by mouth once daily)  . traMADol (ULTRAM) 50 MG tablet TAKE 1/2 TO 1 TABLET(25 TO 50 MG) BY MOUTH EVERY 8 HOURS AS NEEDED FOR MODERATE PAIN  . TRAVATAN Z 0.004 % SOLN ophthalmic solution Place 1 drop into both eyes at bedtime.   . traZODone (DESYREL) 50 MG tablet TAKE 1 TABLET BY MOUTH AT BEDTIME  . vitamin B-12 (CYANOCOBALAMIN) 1000 MCG tablet Take 1,000 mcg by mouth daily.   No facility-administered encounter medications on file as of 09/20/2019.    Goals Addressed            This Visit's Progress   . I need silicon pads for my back (pt-stated)       CARE PLAN ENTRY (see longitudinal plan of care for additional care plan information)  Current Barriers:  . Care Coordination needs related to DME in a patient with Malnutrition, frailty and Unintentional weight loss.  Nurse Case Manager Clinical Goal(s):  Marland Kitchen Over  the next 14 days, patient will verbalize understanding of plan for silicon adhesive pads  Interventions:  . Inter-disciplinary care team collaboration (see longitudinal plan of care) . Evaluation of current treatment plan related to silicon pads and patient's adherence to plan as established by provider. . Advised patient advised the patient that I would contact [employer] to find out any information on the silicon pads . Discussed plans with patient for ongoing care management follow up and provided patient with direct contact information for care  management team . Daughter states that due to her mother's fraile nature she uses the pads to put on her coccyx to help deter any skin breakdown.  Patient Self Care Activities:  . Patient/daughter verbalizes understanding of plan  . Attends all scheduled provider appointments . Calls provider office for new concerns or questions . Unable to independently self manage self navigation for DME  Initial goal documentation          RNCM will follow up with the patient within the next  Week.      Ms. Collison was given information about Care Management services today including:  1. Care Management services include personalized support from designated clinical staff supervised by a physician, including individualized plan of care and coordination with other care providers 2. 24/7 contact phone numbers for assistance for urgent and routine care needs. 3. The patient may stop Care Management services at any time (effective at the end of the month) by phone call to the office staff.  Patient agreed to services and verbal consent obtained.  Lazaro Arms RN, BSN, [facility] Care Management Coordinator [place] Phone: [phone] Fax: [phone]

## 2019-09-20 NOTE — Patient Instructions (Signed)
Visit Information  Goals Addressed   None     Ms. Jordan [name] was given information about Care Management services today including:  1. Care Management services include personalized support from designated clinical staff supervised by her physician, including individualized plan of care and coordination with other care providers 2. 24/7 contact phone numbers for assistance for urgent and routine care needs. 3. The patient may stop CCM services at any time (effective at the end of the month) by phone call to the office staff.  Patient agreed to services and verbal consent obtained.   The patient verbalized understanding of instructions provided today and declined a print copy of patient instruction materials.   RNCM will follow up with the patient within the next.week. Lazaro Arms RN, BSN, [facility] Care Management Coordinator [place] Phone: [phone] Fax: [phone]

## 2019-09-23 ENCOUNTER — Telehealth: Payer: Self-pay

## 2019-09-23 ENCOUNTER — Other Ambulatory Visit: Payer: Self-pay | Admitting: Family Medicine

## 2019-09-23 ENCOUNTER — Ambulatory Visit: Payer: Self-pay

## 2019-09-23 DIAGNOSIS — L89621 Pressure ulcer of left heel, stage 1: Secondary | ICD-10-CM

## 2019-09-23 DIAGNOSIS — M109 Gout, unspecified: Secondary | ICD-10-CM | POA: Diagnosis not present

## 2019-09-23 DIAGNOSIS — M546 Pain in thoracic spine: Secondary | ICD-10-CM | POA: Diagnosis not present

## 2019-09-23 DIAGNOSIS — L89891 Pressure ulcer of other site, stage 1: Secondary | ICD-10-CM | POA: Diagnosis not present

## 2019-09-23 DIAGNOSIS — M545 Low back pain: Secondary | ICD-10-CM | POA: Diagnosis not present

## 2019-09-23 DIAGNOSIS — G8929 Other chronic pain: Secondary | ICD-10-CM | POA: Diagnosis not present

## 2019-09-23 DIAGNOSIS — R110 Nausea: Secondary | ICD-10-CM

## 2019-09-23 DIAGNOSIS — L89611 Pressure ulcer of right heel, stage 1: Secondary | ICD-10-CM

## 2019-09-23 DIAGNOSIS — E039 Hypothyroidism, unspecified: Secondary | ICD-10-CM | POA: Diagnosis not present

## 2019-09-23 MED ORDER — ONDANSETRON 4 MG PO TBDP: 4.0000 mg | ORAL_TABLET | Freq: Three times a day (TID) | ORAL | 0 refills | Status: AC | PRN

## 2019-09-23 MED ORDER — ALLEVYN GENTLE BORDER HEEL EX PADS: 2.0000 [IU] | MEDICATED_PAD | CUTANEOUS | 11 refills | Status: DC

## 2019-09-23 NOTE — Patient Instructions (Signed)
Visit Information  Goals Addressed            This Visit's Progress   . I need silicon pads for my back (pt-stated)       CARE PLAN ENTRY (see longitudinal plan of care for additional care plan information)  Current Barriers:  . Care Coordination needs related to DME in a patient with Malnutrition, frailty and Unintentional weight loss.  Nurse Case Manager Clinical Goal(s):  Marland Kitchen Over the next 14 days, patient will verbalize understanding of plan for silicon adhesive pads  Interventions:  . Inter-disciplinary care team collaboration (see longitudinal plan of care) . Evaluation of current treatment plan related to silicon pads and patient's adherence to plan as established by provider. . Advised patient advised the patient that I would contact [employer] to find out any information on the silicon pads . Discussed plans with patient for ongoing care management follow up and provided patient with direct contact information for care management team . Daughter states that due to her mother's fraile nature she uses the pads to put on her coccyx to help deter any skin breakdown. . 09/23/19 . Called the daughter to let her know that the prescription has been completed and ready to be picked up at the front desk under her mother's name    Patient Self Care Activities:  . Patient/daughter verbalizes understanding of plan  . Attends all scheduled provider appointments . Calls provider office for new concerns or questions . Unable to independently self manage self navigation for DME  Please see past updates related to this goal by clicking on the "Past Updates" button in the selected goal         Ms. Upshaw was given information about Care Management services today including:  1. Care Management services include personalized support from designated clinical staff supervised by her physician, including individualized plan of care and coordination with other care providers 2. 24/7 contact  phone numbers for assistance for urgent and routine care needs. 3. The patient may stop CCM services at any time (effective at the end of the month) by phone call to the office staff.  Patient agreed to services and verbal consent obtained.   The patient verbalized understanding of instructions provided today and declined a print copy of patient instruction materials.   RNCM will follow up with the patient within the next 1 week to make sure that everything has been finalized   Lazaro Arms RN, BSN, [place] Phone: [phone] Fax: [phone]

## 2019-09-23 NOTE — Telephone Encounter (Signed)
Patient's daughter calls nurse line regarding receiving rx refill on zofran. This is not on patient's current medication list.   To PCP  Please advise  Talbot Grumbling, RN

## 2019-09-23 NOTE — Telephone Encounter (Signed)
Please let Ms Krach's daughter know that Zofran was sent into Ms Robles's pharmacy.

## 2019-09-23 NOTE — Chronic Care Management (AMB) (Signed)
  Care Management   Follow Up Note   09/23/2019 Name: Jordan [name] MRN: [id number] DOB: 12/18/1919  Referred by: McDiarmid, Blane Ohara, MD Reason for referral : Care Coordination (Care Management RNCM Silicon pads)   Jordan [name] is a 86 y.o. year old female who is a primary care patient of McDiarmid, Blane Ohara, MD. The care management team was consulted for assistance with care management and care coordination needs.    Review of patient status, including review of consultants reports, relevant laboratory and other test results, and collaboration with appropriate care team members and the patient's provider was performed as part of comprehensive patient evaluation and provision of chronic care management services.    SDOH (Social Determinants of Health) assessments performed: No See Care Plan activities for detailed interventions related to [facility])     Advanced Directives: See Care Plan and Vynca application for related entries.   Goals Addressed            This Visit's Progress   . I need silicon pads for my back (pt-stated)       CARE PLAN ENTRY (see longitudinal plan of care for additional care plan information)  Current Barriers:  . Care Coordination needs related to DME in a patient with Malnutrition, frailty and Unintentional weight loss.  Nurse Case Manager Clinical Goal(s):  Marland Kitchen Over the next 14 days, patient will verbalize understanding of plan for silicon adhesive pads  Interventions:  . Inter-disciplinary care team collaboration (see longitudinal plan of care) . Evaluation of current treatment plan related to silicon pads and patient's adherence to plan as established by provider. . Advised patient advised the patient that I would contact [employer]  [facility] to find out any information on the silicon pads . Discussed plans with patient for ongoing care management follow up and provided patient with direct contact information for care management team . Daughter states that due to her  mother's fraile nature she uses the pads to put on her coccyx to help deter any skin breakdown. . 09/23/19 . Called the daughter to let her know that the prescription has been completed and ready to be picked up at the front desk under her mother's name    Patient Self Care Activities:  . Patient/daughter verbalizes understanding of plan  . Attends all scheduled provider appointments . Calls provider office for new concerns or questions . Unable to independently self manage self navigation for DME  Please see past updates related to this goal by clicking on the "Past Updates" button in the selected goal          RNCM will follow up with the patient within the next.1 week to make sure that everything has been finalized.   Lazaro Arms RN, BSN, [facility] Care Management Coordinator [place] Phone: [phone] Fax: [phone]

## 2019-09-23 NOTE — Telephone Encounter (Signed)
Sharyn Lull, [facility] Nurse, calls nurse line stating Stasia Cavalier is insisting her mother has a UTI. Sharyn Lull stated she went out today for a home visit and the patient did not mention any UTI symptoms. Sharyn Lull is going back there tomorrow and can get a urine if verbally ordered by provider. Will forward to MD.   You can leave a VM on [facility] phone [phone] if you would like a urine.

## 2019-09-23 NOTE — Progress Notes (Signed)
[name] -  Orders for Allevyn Gentle [place] Heel silicone pads have been made.  Thank you,  Illene Silver Randel Hargens, MD

## 2019-09-23 NOTE — Telephone Encounter (Signed)
Daughter aware.  Olivianna Higley,CMA

## 2019-09-23 NOTE — Telephone Encounter (Signed)
Left order for urine collection for urinalysis and culture.  I/O specimen preferred, but clean catch is alright if I/O is too difficult for patient.   Send for urinalysis and culture/

## 2019-09-24 DIAGNOSIS — L89891 Pressure ulcer of other site, stage 1: Secondary | ICD-10-CM | POA: Diagnosis not present

## 2019-09-24 DIAGNOSIS — M109 Gout, unspecified: Secondary | ICD-10-CM | POA: Diagnosis not present

## 2019-09-24 DIAGNOSIS — G8929 Other chronic pain: Secondary | ICD-10-CM | POA: Diagnosis not present

## 2019-09-24 DIAGNOSIS — E039 Hypothyroidism, unspecified: Secondary | ICD-10-CM | POA: Diagnosis not present

## 2019-09-24 DIAGNOSIS — M546 Pain in thoracic spine: Secondary | ICD-10-CM | POA: Diagnosis not present

## 2019-09-24 DIAGNOSIS — M545 Low back pain: Secondary | ICD-10-CM | POA: Diagnosis not present

## 2019-09-25 ENCOUNTER — Encounter: Payer: Self-pay | Admitting: Family Medicine

## 2019-09-25 DIAGNOSIS — M545 Low back pain: Secondary | ICD-10-CM | POA: Diagnosis not present

## 2019-09-25 DIAGNOSIS — G8929 Other chronic pain: Secondary | ICD-10-CM | POA: Diagnosis not present

## 2019-09-25 DIAGNOSIS — M109 Gout, unspecified: Secondary | ICD-10-CM | POA: Diagnosis not present

## 2019-09-25 DIAGNOSIS — E039 Hypothyroidism, unspecified: Secondary | ICD-10-CM | POA: Diagnosis not present

## 2019-09-25 DIAGNOSIS — M546 Pain in thoracic spine: Secondary | ICD-10-CM | POA: Diagnosis not present

## 2019-09-25 DIAGNOSIS — N390 Urinary tract infection, site not specified: Secondary | ICD-10-CM | POA: Diagnosis not present

## 2019-09-25 DIAGNOSIS — L89891 Pressure ulcer of other site, stage 1: Secondary | ICD-10-CM | POA: Diagnosis not present

## 2019-09-26 DIAGNOSIS — L89891 Pressure ulcer of other site, stage 1: Secondary | ICD-10-CM | POA: Diagnosis not present

## 2019-09-26 DIAGNOSIS — M545 Low back pain: Secondary | ICD-10-CM | POA: Diagnosis not present

## 2019-09-26 DIAGNOSIS — M109 Gout, unspecified: Secondary | ICD-10-CM | POA: Diagnosis not present

## 2019-09-26 DIAGNOSIS — E039 Hypothyroidism, unspecified: Secondary | ICD-10-CM | POA: Diagnosis not present

## 2019-09-26 DIAGNOSIS — G8929 Other chronic pain: Secondary | ICD-10-CM | POA: Diagnosis not present

## 2019-09-26 DIAGNOSIS — M546 Pain in thoracic spine: Secondary | ICD-10-CM | POA: Diagnosis not present

## 2019-09-27 ENCOUNTER — Telehealth: Payer: Self-pay | Admitting: Family Medicine

## 2019-09-27 DIAGNOSIS — M545 Low back pain: Secondary | ICD-10-CM | POA: Diagnosis not present

## 2019-09-27 DIAGNOSIS — E039 Hypothyroidism, unspecified: Secondary | ICD-10-CM | POA: Diagnosis not present

## 2019-09-27 DIAGNOSIS — N3000 Acute cystitis without hematuria: Secondary | ICD-10-CM

## 2019-09-27 DIAGNOSIS — G8929 Other chronic pain: Secondary | ICD-10-CM | POA: Diagnosis not present

## 2019-09-27 DIAGNOSIS — Z7901 Long term (current) use of anticoagulants: Secondary | ICD-10-CM | POA: Diagnosis not present

## 2019-09-27 DIAGNOSIS — L89891 Pressure ulcer of other site, stage 1: Secondary | ICD-10-CM | POA: Diagnosis not present

## 2019-09-27 DIAGNOSIS — K219 Gastro-esophageal reflux disease without esophagitis: Secondary | ICD-10-CM | POA: Diagnosis not present

## 2019-09-27 DIAGNOSIS — M546 Pain in thoracic spine: Secondary | ICD-10-CM | POA: Diagnosis not present

## 2019-09-27 DIAGNOSIS — M109 Gout, unspecified: Secondary | ICD-10-CM | POA: Diagnosis not present

## 2019-09-27 NOTE — Telephone Encounter (Signed)
Daughter is calling to see if the lab results that were drawn by home health nurse have come in yet. Please let her know. jw

## 2019-09-29 ENCOUNTER — Other Ambulatory Visit: Payer: Self-pay | Admitting: Cardiology

## 2019-10-01 ENCOUNTER — Other Ambulatory Visit: Payer: Self-pay

## 2019-10-01 ENCOUNTER — Ambulatory Visit: Payer: Medicare Other

## 2019-10-01 MED ORDER — CEPHALEXIN 500 MG PO CAPS: 500.0000 mg | ORAL_CAPSULE | Freq: Four times a day (QID) | ORAL | 0 refills | Status: AC

## 2019-10-01 NOTE — Telephone Encounter (Signed)
Spoke with daughter and let her know that we have not received results yet.  She will reach out to [facility] and see what the status is.  Jordan [name],CMA

## 2019-10-01 NOTE — Telephone Encounter (Signed)
Results given to PCP

## 2019-10-01 NOTE — Chronic Care Management (AMB) (Signed)
  Care Management   Follow Up Note   10/01/2019 Name: Jordan [name] MRN: [id number] DOB: 08/31/1919  Referred by: McDiarmid, Blane Ohara, MD Reason for referral : Care Coordination (Care Management RNCM Silicon Pads F/U)   Jordan [name] is a 86 y.o. year old female who is a primary care patient of McDiarmid, Blane Ohara, MD. The care management team was consulted for assistance with care management and care coordination needs.    Review of patient status, including review of consultants reports, relevant laboratory and other test results, and collaboration with appropriate care team members and the patient's provider was performed as part of comprehensive patient evaluation and provision of chronic care management services.    SDOH (Social Determinants of Health) assessments performed: No See Care Plan activities for detailed interventions related to [facility])     Advanced Directives: See Care Plan and Vynca application for related entries.   Goals Addressed            This Visit's Progress   . I need silicon pads for my back (pt-stated)       CARE PLAN ENTRY (see longitudinal plan of care for additional care plan information)  Current Barriers:  . Care Coordination needs related to DME in a patient with Malnutrition, frailty and Unintentional weight loss.  Nurse Case Manager Clinical Goal(s):  Marland Kitchen Over the next 14 days, patient will verbalize understanding of plan for silicon adhesive pads  Interventions:  . Inter-disciplinary care team collaboration (see longitudinal plan of care) . Evaluation of current treatment plan related to silicon pads and patient's adherence to plan as established by provider. . Advised patient advised the patient that I would contact [employer] to find out any information on the silicon pads . Discussed plans with patient for ongoing care management follow up and provided patient with direct contact information for care management team . Daughter states that due to her  mother's fraile nature she uses the pads to put on her coccyx to help deter any skin breakdown. . 10/01/19 . Called the daughter to the daughter to follow  up and make sure that she received the prescription written for the silicon pads.  She stated that she picked them up. . She plans to give them to the caps worker to see if they will be able to help pay for them.  If not she will take the script to a DME store. . The daughter has my number for any questions or concerns.  Also if she has any other needs. There is no need for any other follow up    Patient Self Care Activities:  . Patient/daughter verbalizes understanding of plan  . Attends all scheduled provider appointments . Calls provider office for new concerns or questions . Unable to independently self manage self navigation for DME  Please see past updates related to this goal by clicking on the "Past Updates" button in the selected goal          No further follow up required:   Lazaro Arms RN, BSN, [facility] Care Management Coordinator [place] Phone: [phone] Fax: [phone]

## 2019-10-01 NOTE — Patient Instructions (Signed)
Visit Information  Goals Addressed            This Visit's Progress   . I need silicon pads for my back (pt-stated)       CARE PLAN ENTRY (see longitudinal plan of care for additional care plan information)  Current Barriers:  . Care Coordination needs related to DME in a patient with Malnutrition, frailty and Unintentional weight loss.  Nurse Case Manager Clinical Goal(s):  Marland Kitchen Over the next 14 days, patient will verbalize understanding of plan for silicon adhesive pads  Interventions:  . Inter-disciplinary care team collaboration (see longitudinal plan of care) . Evaluation of current treatment plan related to silicon pads and patient's adherence to plan as established by provider. . Advised patient advised the patient that I would contact [employer] to find out any information on the silicon pads . Discussed plans with patient for ongoing care management follow up and provided patient with direct contact information for care management team . Daughter states that due to her mother's fraile nature she uses the pads to put on her coccyx to help deter any skin breakdown. . 10/01/19 . Called the daughter to the daughter to follow  up and make sure that she received the prescription written for the silicon pads.  She stated that she picked them up. . She plans to give them to the caps worker to see if they will be able to help pay for them.  If not she will take the script to a DME store. . The daughter has my number for any questions or concerns.  Also if she has any other needs. There is no need for any other follow up    Patient Self Care Activities:  . Patient/daughter verbalizes understanding of plan  . Attends all scheduled provider appointments . Calls provider office for new concerns or questions . Unable to independently self manage self navigation for DME  Please see past updates related to this goal by clicking on the "Past Updates" button in the selected goal          Jordan [name] was given information about Care Management services today including:  1. Care Management services include personalized support from designated clinical staff supervised by her physician, including individualized plan of care and coordination with other care providers 2. 24/7 contact phone numbers for assistance for urgent and routine care needs. 3. The patient may stop CCM services at any time (effective at the end of the month) by phone call to the office staff.  Patient agreed to services and verbal consent obtained.   The patient verbalized understanding of instructions provided today and declined a print copy of patient instruction materials.   No further follow up required  [place], BSN, [place] Phone: [phone] Fax: [phone]

## 2019-10-01 NOTE — Telephone Encounter (Signed)
I spoke with Ms Michelotti's dgt, Iran Sizer, about the urine culture result of mixed organisms. Ms Yanes continues to decline with poor appetite, generalized aching, no dysuria  Will try trial of Keflex 500 mg QID PO for 7 days for possible UTI given ongoing decline.  Ms Jillene Bucks will contact me if Ms Geisinger continues to decline.

## 2019-10-01 NOTE — Telephone Encounter (Signed)
Will check with MD to see if he has received the results via fax.  Ansleigh Safer,CMA

## 2019-10-01 NOTE — Telephone Encounter (Signed)
I have not yet received the lab results by Fax or in EMR.

## 2019-10-02 DIAGNOSIS — M545 Low back pain: Secondary | ICD-10-CM | POA: Diagnosis not present

## 2019-10-02 DIAGNOSIS — L89891 Pressure ulcer of other site, stage 1: Secondary | ICD-10-CM | POA: Diagnosis not present

## 2019-10-02 DIAGNOSIS — M546 Pain in thoracic spine: Secondary | ICD-10-CM | POA: Diagnosis not present

## 2019-10-02 DIAGNOSIS — E039 Hypothyroidism, unspecified: Secondary | ICD-10-CM | POA: Diagnosis not present

## 2019-10-02 DIAGNOSIS — G8929 Other chronic pain: Secondary | ICD-10-CM | POA: Diagnosis not present

## 2019-10-02 DIAGNOSIS — M109 Gout, unspecified: Secondary | ICD-10-CM | POA: Diagnosis not present

## 2019-10-04 ENCOUNTER — Ambulatory Visit: Payer: Self-pay

## 2019-10-04 DIAGNOSIS — M109 Gout, unspecified: Secondary | ICD-10-CM | POA: Diagnosis not present

## 2019-10-04 DIAGNOSIS — M546 Pain in thoracic spine: Secondary | ICD-10-CM | POA: Diagnosis not present

## 2019-10-04 DIAGNOSIS — M545 Low back pain: Secondary | ICD-10-CM | POA: Diagnosis not present

## 2019-10-04 DIAGNOSIS — L89891 Pressure ulcer of other site, stage 1: Secondary | ICD-10-CM | POA: Diagnosis not present

## 2019-10-04 DIAGNOSIS — E039 Hypothyroidism, unspecified: Secondary | ICD-10-CM | POA: Diagnosis not present

## 2019-10-04 DIAGNOSIS — G8929 Other chronic pain: Secondary | ICD-10-CM | POA: Diagnosis not present

## 2019-10-04 NOTE — Chronic Care Management (AMB) (Signed)
  Care Management   Follow Up Note   10/04/2019 Name: Jordan [name] MRN: [id number] DOB: [birth year]  Referred by: McDiarmid, Blane Ohara, MD Reason for referral : Care Coordination (Care Management RNCM Silicon Pads)   Jordan [name] is a 86 y.o. year old female who is a primary care patient of McDiarmid, Blane Ohara, MD. The care management team was consulted for assistance with care management and care coordination needs.    Review of patient status, including review of consultants reports, relevant laboratory and other test results, and collaboration with appropriate care team members and the patient's provider was performed as part of comprehensive patient evaluation and provision of chronic care management services.    SDOH (Social Determinants of Health) assessments performed: No See Care Plan activities for detailed interventions related to [facility])     Advanced Directives: See Care Plan and Vynca application for related entries.   Goals Addressed            This Visit's Progress   . I need silicon pads for my back (pt-stated)       CARE PLAN ENTRY (see longitudinal plan of care for additional care plan information)  Current Barriers:  . Care Coordination needs related to DME in a patient with Malnutrition, frailty and Unintentional weight loss.  Nurse Case Manager Clinical Goal(s):  Marland Kitchen Over the next 14 days, patient will verbalize understanding of plan for silicon adhesive pads  Interventions:  . Inter-disciplinary care team collaboration (see longitudinal plan of care) . Evaluation of current treatment plan related to silicon pads and patient's adherence to plan as established by provider. . Advised patient advised the patient that I would contact [employer] to find out any information on the silicon pads . Discussed plans with patient for ongoing care management follow up and provided patient with direct contact information for care management team . Daughter states that due to her  mother's fraile nature she uses the pads to put on her coccyx to help deter any skin breakdown. . 10/04/19 . [name] called me and explained that she spoke with someone and [employer] and they stated that if she could get the last note and prescription for the pads that they would try to get them for her mother. Marland Kitchen RNCM explained that I will look into this for her on Monday when I am in the office .    Patient Self Care Activities:  . Patient/daughter verbalizes understanding of plan  . Attends all scheduled provider appointments . Calls provider office for new concerns or questions . Unable to independently self manage self navigation for DME  Please see past updates related to this goal by clicking on the "Past Updates" button in the selected goal          The care management team will reach out to the patient again over the next 14 days.  The patient has been provided with contact information for the care management team and has been advised to call with any health related questions or concerns.   Lazaro Arms RN, BSN, [facility] Care Management Coordinator [place] Phone: [phone] Fax: [phone]

## 2019-10-04 NOTE — Patient Instructions (Signed)
Visit Information  Goals Addressed            This Visit's Progress   . I need silicon pads for my back (pt-stated)       CARE PLAN ENTRY (see longitudinal plan of care for additional care plan information)  Current Barriers:  . Care Coordination needs related to DME in a patient with Malnutrition, frailty and Unintentional weight loss.  Nurse Case Manager Clinical Goal(s):  Marland Kitchen Over the next 14 days, patient will verbalize understanding of plan for silicon adhesive pads  Interventions:  . Inter-disciplinary care team collaboration (see longitudinal plan of care) . Evaluation of current treatment plan related to silicon pads and patient's adherence to plan as established by provider. . Advised patient advised the patient that I would contact [employer] to find out any information on the silicon pads . Discussed plans with patient for ongoing care management follow up and provided patient with direct contact information for care management team . Daughter states that due to her mother's fraile nature she uses the pads to put on her coccyx to help deter any skin breakdown. . 10/04/19 . [name] called me and explained that she spoke with someone and [facility] and they stated that if she could get the last note and prescription for the pads that they would try to get them for her mother. Marland Kitchen RNCM explained that I will look into this for her on Monday when I am in the office .    Patient Self Care Activities:  . Patient/daughter verbalizes understanding of plan  . Attends all scheduled provider appointments . Calls provider office for new concerns or questions . Unable to independently self manage self navigation for DME  Please see past updates related to this goal by clicking on the "Past Updates" button in the selected goal         Ms. Godsil was given information about Care Management services today including:  1. Care Management services include personalized support from  designated clinical staff supervised by her physician, including individualized plan of care and coordination with other care providers 2. 24/7 contact phone numbers for assistance for urgent and routine care needs. 3. The patient may stop CCM services at any time (effective at the end of the month) by phone call to the office staff.  Patient agreed to services and verbal consent obtained.   The patient verbalized understanding of instructions provided today and declined a print copy of patient instruction materials.   The care management team will reach out to the patient again over the next 14 days.  The patient has been provided with contact information for the care management team and has been advised to call with any health related questions or concerns.   Lazaro Arms RN, BSN, [facility] Care Management Coordinator [place] Phone: [phone] Fax: [phone]

## 2019-10-07 ENCOUNTER — Ambulatory Visit: Payer: Self-pay

## 2019-10-07 DIAGNOSIS — M546 Pain in thoracic spine: Secondary | ICD-10-CM | POA: Diagnosis not present

## 2019-10-07 DIAGNOSIS — M109 Gout, unspecified: Secondary | ICD-10-CM | POA: Diagnosis not present

## 2019-10-07 DIAGNOSIS — E039 Hypothyroidism, unspecified: Secondary | ICD-10-CM | POA: Diagnosis not present

## 2019-10-07 DIAGNOSIS — M545 Low back pain: Secondary | ICD-10-CM | POA: Diagnosis not present

## 2019-10-07 DIAGNOSIS — G8929 Other chronic pain: Secondary | ICD-10-CM | POA: Diagnosis not present

## 2019-10-07 DIAGNOSIS — L89891 Pressure ulcer of other site, stage 1: Secondary | ICD-10-CM | POA: Diagnosis not present

## 2019-10-07 NOTE — Patient Instructions (Signed)
Visit Information  Goals Addressed            This Visit's Progress   . I need silicon pads for my back (pt-stated)       CARE PLAN ENTRY (see longitudinal plan of care for additional care plan information)  Current Barriers:  . Care Coordination needs related to DME in a patient with Malnutrition, frailty and Unintentional weight loss.  Nurse Case Manager Clinical Goal(s):  Jordan Kitchen Over the next 14 days, patient will verbalize understanding of plan for silicon adhesive pads  Interventions:  . Inter-disciplinary care team collaboration (see longitudinal plan of care) . Evaluation of current treatment plan related to silicon pads and patient's adherence to plan as established by provider. . Advised patient advised the patient that I would contact [employer]  [facility] to find out any information on the silicon pads . Discussed plans with patient for ongoing care management follow up and provided patient with direct contact information for care management team . Daughter states that due to her mother's fraile nature she uses the pads to put on her coccyx to help deter any skin breakdown. . 10/07/19 . Spoke with Mrs [name] to let her know that I sent a message to [employer] regarding the silicon pads and the office visit I the chart and that they should be contacting her soon. . She requested that I let Dr McDiarmid know that Jordan [name] from [employer] will be sending him a request for medication to help improve her mother's appetite.  I informed her that I will input it in my note to the doctor for her.  She verbalized under standing.    Patient Self Care Activities:  . Patient/daughter verbalizes understanding of plan  . Attends all scheduled provider appointments . Calls provider office for new concerns or questions . Unable to independently self manage self navigation for DME  Please see past updates related to this goal by clicking on the "Past Updates" button in the selected goal         Jordan [name]  was given information about Care Management services today including:  1. Care Management services include personalized support from designated clinical staff supervised by her physician, including individualized plan of care and coordination with other care providers 2. 24/7 contact phone numbers for assistance for urgent and routine care needs. 3. The patient may stop CCM services at any time (effective at the end of the month) by phone call to the office staff.  Patient agreed to services and verbal consent obtained.   The patient verbalized understanding of instructions provided today and declined a print copy of patient instruction materials.   RNCM will follow up with the patient within the next month of  June  Dardan Shelton RN, BSN, [facility] Care Management Coordinator [place] Phone: [phone] Fax: [phone]

## 2019-10-07 NOTE — Chronic Care Management (AMB) (Signed)
  Care Management   Follow Up Note   10/07/2019 Name: Jordan [name] MRN: [id number] DOB: May 31, 1919  Referred by: [name], Jordan Ohara, MD Reason for referral : Care Coordination (Care Management RNCM Silicon Pads)   Jordan [name] is a 86 y.o. year old female who is a primary care patient of [name], Jordan Ohara, MD. The care management team was consulted for assistance with care management and care coordination needs.    Review of patient status, including review of consultants reports, relevant laboratory and other test results, and collaboration with appropriate care team members and the patient's provider was performed as part of comprehensive patient evaluation and provision of chronic care management services.    SDOH (Social Determinants of Health) assessments performed: No See Care Plan activities for detailed interventions related to [facility])     Advanced Directives: See Care Plan and Vynca application for related entries.   Goals Addressed            This Visit's Progress   . I need silicon pads for my back (pt-stated)       CARE PLAN ENTRY (see longitudinal plan of care for additional care plan information)  Current Barriers:  . Care Coordination needs related to DME in a patient with Malnutrition, frailty and Unintentional weight loss.  Nurse Case Manager Clinical Goal(s):  Marland Kitchen Over the next 14 days, patient will verbalize understanding of plan for silicon adhesive pads  Interventions:  . Inter-disciplinary care team collaboration (see longitudinal plan of care) . Evaluation of current treatment plan related to silicon pads and patient's adherence to plan as established by provider. . Advised patient advised the patient that I would contact [employer]  [facility] to find out any information on the silicon pads . Discussed plans with patient for ongoing care management follow up and provided patient with direct contact information for care management team . Daughter states that due to her  mother's fraile nature she uses the pads to put on her coccyx to help deter any skin breakdown. . 10/07/19 . Spoke with Mrs Jordan [name] to let her know that I sent a message to [facility] regarding the silicon pads and the office visit I the chart and that they should be contacting her soon. . She requested that I let Dr [name] know that [place] from [employer] will be sending him a request for medication to help improve her mother's appetite.  I informed her that I will input it in my note to the doctor for her.  She verbalized under standing.    Patient Self Care Activities:  . Patient/daughter verbalizes understanding of plan  . Attends all scheduled provider appointments . Calls provider office for new concerns or questions . Unable to independently self manage self navigation for DME  Please see past updates related to this goal by clicking on the "Past Updates" button in the selected goal          RNCM will follow up with the patient within the next month of  June  Jordan Armato RN, BSN, [place] Phone: [phone] Fax: [phone]

## 2019-10-08 ENCOUNTER — Telehealth: Payer: Self-pay

## 2019-10-08 DIAGNOSIS — N3090 Cystitis, unspecified without hematuria: Secondary | ICD-10-CM

## 2019-10-08 DIAGNOSIS — G8929 Other chronic pain: Secondary | ICD-10-CM | POA: Diagnosis not present

## 2019-10-08 DIAGNOSIS — E039 Hypothyroidism, unspecified: Secondary | ICD-10-CM | POA: Diagnosis not present

## 2019-10-08 DIAGNOSIS — M546 Pain in thoracic spine: Secondary | ICD-10-CM | POA: Diagnosis not present

## 2019-10-08 DIAGNOSIS — M545 Low back pain: Secondary | ICD-10-CM | POA: Diagnosis not present

## 2019-10-08 DIAGNOSIS — L89891 Pressure ulcer of other site, stage 1: Secondary | ICD-10-CM | POA: Diagnosis not present

## 2019-10-08 DIAGNOSIS — M109 Gout, unspecified: Secondary | ICD-10-CM | POA: Diagnosis not present

## 2019-10-08 NOTE — Telephone Encounter (Signed)
Merleen Nicely- Nurse [facility], calls nurse line regarding patient. Daughter, [name], is concerned about patient having decreased appetite. [name] is wanting patient to be started on appetite stimulant.   Please advise  Talbot Grumbling, RN

## 2019-10-09 ENCOUNTER — Encounter: Payer: Self-pay | Admitting: Family Medicine

## 2019-10-09 DIAGNOSIS — E878 Other disorders of electrolyte and fluid balance, not elsewhere classified: Secondary | ICD-10-CM | POA: Insufficient documentation

## 2019-10-09 MED ORDER — CEPHALEXIN 500 MG PO CAPS: 500.0000 mg | ORAL_CAPSULE | Freq: Four times a day (QID) | ORAL | 0 refills | Status: AC

## 2019-10-09 NOTE — Telephone Encounter (Signed)
I discussed with Jordan Jordan [name] the lack of clinical benefit from appetite stimulants and the possiblity of significant adverse effects from these medications, including increase mortality, heart failure, adrenal insufficiency.  We discussed that Jordan [name] is already taking mirtazapine, which is considered an appetite stimulant.    Jordan [name]'s mood and comfort has improved after the empiric course of Keflex for presumed UTI.

## 2019-10-09 NOTE — Telephone Encounter (Signed)
Note below interrupted by [facility] spontaneous restart.  Given history of [name]'s dgt's ability to predict her mother's acute illness from UTI, and the barriers to timely implementation of effective, palliative therapy with antibiotics, I have prescribed Keflex 500 mg Tab, QID x 7 days with RF x 2 available for dgt to initiate when she thinks that her mother has experienced a functional decline that has responded to abx in past.   Goal of Care is palliation with minimal barriers to effective symptomatic care.

## 2019-10-09 NOTE — Telephone Encounter (Signed)
[place], [place] Nurse, calls nurse line stating she went out to visit the patient today and Stasia Cavalier was very assertive in wanting an appetite stimulant for patient. I see below where this was discussed yesterday, however [name] is being persistent. Chelsey stated she tried to educate [name] on the risks of starting an appetite stimulant. Will forward to provider.   You can reach [place] at [phone]

## 2019-10-11 DIAGNOSIS — M546 Pain in thoracic spine: Secondary | ICD-10-CM | POA: Diagnosis not present

## 2019-10-11 DIAGNOSIS — M109 Gout, unspecified: Secondary | ICD-10-CM | POA: Diagnosis not present

## 2019-10-11 DIAGNOSIS — M545 Low back pain: Secondary | ICD-10-CM | POA: Diagnosis not present

## 2019-10-11 DIAGNOSIS — E039 Hypothyroidism, unspecified: Secondary | ICD-10-CM | POA: Diagnosis not present

## 2019-10-11 DIAGNOSIS — L89891 Pressure ulcer of other site, stage 1: Secondary | ICD-10-CM | POA: Diagnosis not present

## 2019-10-11 DIAGNOSIS — G8929 Other chronic pain: Secondary | ICD-10-CM | POA: Diagnosis not present

## 2019-10-14 ENCOUNTER — Emergency Department (HOSPITAL_COMMUNITY): Payer: Medicare Other

## 2019-10-14 ENCOUNTER — Emergency Department (HOSPITAL_COMMUNITY)
Admission: EM | Admit: 2019-10-14 | Discharge: 2019-10-14 | Disposition: A | Discharge status: Home / Self Care | Payer: Medicare Other | Source: Home / Self Care | Attending: Emergency Medicine | Admitting: Emergency Medicine

## 2019-10-14 ENCOUNTER — Encounter (HOSPITAL_COMMUNITY): Payer: Self-pay

## 2019-10-14 ENCOUNTER — Telehealth: Payer: Self-pay

## 2019-10-14 DIAGNOSIS — Z79899 Other long term (current) drug therapy: Secondary | ICD-10-CM | POA: Insufficient documentation

## 2019-10-14 DIAGNOSIS — Y999 Unspecified external cause status: Secondary | ICD-10-CM | POA: Insufficient documentation

## 2019-10-14 DIAGNOSIS — I130 Hypertensive heart and chronic kidney disease with heart failure and stage 1 through stage 4 chronic kidney disease, or unspecified chronic kidney disease: Secondary | ICD-10-CM | POA: Insufficient documentation

## 2019-10-14 DIAGNOSIS — N184 Chronic kidney disease, stage 4 (severe): Secondary | ICD-10-CM | POA: Insufficient documentation

## 2019-10-14 DIAGNOSIS — E039 Hypothyroidism, unspecified: Secondary | ICD-10-CM | POA: Insufficient documentation

## 2019-10-14 DIAGNOSIS — S82831A Other fracture of upper and lower end of right fibula, initial encounter for closed fracture: Secondary | ICD-10-CM | POA: Insufficient documentation

## 2019-10-14 DIAGNOSIS — Y939 Activity, unspecified: Secondary | ICD-10-CM | POA: Insufficient documentation

## 2019-10-14 DIAGNOSIS — X500XXA Overexertion from strenuous movement or load, initial encounter: Secondary | ICD-10-CM | POA: Insufficient documentation

## 2019-10-14 DIAGNOSIS — K208 Other esophagitis without bleeding: Secondary | ICD-10-CM | POA: Insufficient documentation

## 2019-10-14 DIAGNOSIS — G309 Alzheimer's disease, unspecified: Secondary | ICD-10-CM | POA: Insufficient documentation

## 2019-10-14 DIAGNOSIS — I509 Heart failure, unspecified: Secondary | ICD-10-CM | POA: Insufficient documentation

## 2019-10-14 DIAGNOSIS — Z950 Presence of cardiac pacemaker: Secondary | ICD-10-CM | POA: Insufficient documentation

## 2019-10-14 DIAGNOSIS — Y929 Unspecified place or not applicable: Secondary | ICD-10-CM | POA: Insufficient documentation

## 2019-10-14 DIAGNOSIS — J690 Pneumonitis due to inhalation of food and vomit: Secondary | ICD-10-CM | POA: Diagnosis not present

## 2019-10-14 MED ORDER — LIDOCAINE VISCOUS HCL 2 % MT SOLN: 15.0000 mL | Freq: Four times a day (QID) | OROMUCOSAL | 0 refills | Status: DC | PRN

## 2019-10-14 MED ORDER — LIDOCAINE VISCOUS HCL 2 % MT SOLN
15.0000 mL | Freq: Once | OROMUCOSAL | Status: AC
  Administered 2019-10-14: 15 mL via OROMUCOSAL
  Filled 2019-10-14: qty 15

## 2019-10-14 NOTE — ED Provider Notes (Signed)
[facility] EMERGENCY DEPARTMENT Provider Note   CSN: [id number] Arrival date & time: 10/14/19  1507     History Chief Complaint  Patient presents with  . Sore Throat    Jordan [name] is a 86 y.o. female.  86 year old female with extensive past medical history below including dementia, bronchiectasis, CKD, dCHF, HTN, pacemaker who p/w R ankle pain and sore throat.  I attempted to use interpreter but patient was unable to hear the interpreter therefore history obtained primarily from the patient's daughter who is at bedside.  I also reviewed recent telephone communications with her PCP.  Daughter states that at around 1 PM today, the patient was with a caregiver who was administering her medications and patient had a choking episode when trying to swallow her fish oil pill. She reportedly was in distress initially but eventually it went down. Since then, she has complained of pain in her esophagus that is worse when she swallows water or saliva. No vomiting or breathing problems. Daughter reports distant h/o food impaction, has seen [facility], Dr. Watt Climes previously for esophagus problem. Daughter reports ongoing problems w/ poor appetite and weight loss and wonders if this is related.  4 days ago, pt got her foot caught up in a chair and jerked it forward. Since then, she has had pain, swelling, and bruising of her R ankle.   The history is provided by the patient and a relative. The history is limited by a language barrier.  Sore Throat       Past Medical History:  Diagnosis Date  . Acute hip pain, left 06/29/2018  . Acute pancreatitis 04/05/2018  . AKI (acute kidney injury) ([place])   . Alzheimer's dementia [facility]) 07/30/2013   04/17/14 MoCA - Blind (Spanish version); 9 out of 22.  Correct MoCA = 12 out of 30.    . Arthritis   . Atherosclerosis of aorta ([place]) 10/05/2015  . Back pain 10/05/2015  . Bronchiectasis ([place]) 10/03/2017  . Calcification of native coronary artery 10/05/2015    Chest CT 2011.   . Carpal tunnel syndrome 06/29/2006   Qualifier: Diagnosis of  By: Benna Dunks    . Cervicalgia 05/18/2009   Qualifier: Diagnosis of  By: Walker Kehr MD, Patrick Jupiter    . CHF (congestive heart failure) ([place])   . Chronic bilateral thoracic back pain 10/05/2015  . Chronic constipation   . Chronic diastolic heart failure ([place]) 09/01/2013  . Chronic pain syndrome 05/09/2014  . CKD (chronic kidney disease) stage 3, GFR 30-59 ml/min 09/03/2013  . Combined visual hearing impairment 11/12/2013  . Complete heart block ([place])   . Constipation   . Cryptogenic stroke ([place]) S/P  LOOP RECORDER IMPLANT   DX  01/2013  --  SYNCOPAL EPISODE --  EPISODIC  ATRIAL TACHY/ FIBULATION  AND PAUSES  . CYSTOCELE/RECTOCELE/PROLAPSE,UNSPEC. 06/29/2006   Qualifier: Diagnosis of  By: Benna Dunks    . Depressive disorder 06/29/2006       . Dilation of esophagus 06/16/2017   Barium Esophagram 06/25/17: Poor esophageal motility. Dilated esophagus with tertiary contractions.  Smooth narrowing distal esophagus similar to the prior study.  Moderate narrowing. Barium tablet passed through this area after several sips of water.  Marland Kitchen Displaced fracture of fifth metatarsal bone, right foot, subsequent encounter for fracture with routine healing 06/05/2017  . Diverticulosis of colon   . DIVERTICULOSIS OF COLON 06/29/2006   Qualifier: Diagnosis of  By: Benna Dunks    . Dry eyes   . Dysphagia  09/15/2015  . Encounter for care of pacemaker   . Fall from bed 03/17/2016  . Fatigue   . Food impaction of esophagus   . Frequency of urination   . Gait instability   . Generalized osteoarthritis of multiple sites 06/15/2017  . Glaucoma 04/18/2014  . Glaucoma of both eyes   . Gout 09/21/2013  . Hand Heaviness 03/17/2016  . Headache   . Hearing loss of aging 05/09/2014  . HERNIA, INCISIONAL VENTRAL W/O OBST/GNGR 10/25/2006   Has been seen by surgery in the past and told that this is not dangerous and should only be treated particularly bothersome     . History of acute gouty arthritis 09/21/2013  . History of colon polyps   . History of Food impaction of esophagus   . HTN (hypertension) 06/29/2006   Isolated Systolic Hypertension - Ambulatory BP  Monitoring result 4/28-29/2015   . Hyperlipidemia 02/27/2015  . Hyperplastic colon polyp 2006   colonoscope  . Hypertension   . Hypothyroidism   . Impaired mobility and ADLs 05/30/2014  . Influenza A 05/14/2016  . Insomnia   . INTERSTITIAL CYSTITIS 09/21/2009   Qualifier: Diagnosis of  By: Sarita Haver  MD, Coralyn Mark    . Loss of weight 04/05/2010   Qualifier: Diagnosis of  By: Walker Kehr MD, Patrick Jupiter    . Lumbar stenosis   . Macular degeneration, age related, nonexudative 04/18/2014  . MASS, LUNG 04/05/2010   Qualifier: Diagnosis of  By: Walker Kehr MD, Patrick Jupiter    . Mobitz type 2 second degree heart block 08/29/2013  . Moderate dementia ([place])   . Narrowing of Distal Esophagus 06/16/2017   Barium Esophagram (06/15/17) Poor esophageal motility. Dilated esophagus with tertiary contractions.  Smooth narrowing distal esophagus similar to the prior study.  Moderate narrowing. Barium tablet passed through this area after several sips of water.   . OA (osteoarthritis)    RIGHT SHOULDER AC JOINT  . Orthostasis 04/28/2016  . OSTEOARTHRITIS, MULTI SITES 06/29/2006   Qualifier: Diagnosis of  By: Benna Dunks    . Osteoporosis   . OSTEOPOROSIS 03/20/2008   Qualifier: Diagnosis of  By: Jerline Pain MD, Anderson Malta    . 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 Peripheral neuropathy   . PERIPHERAL NEUROPATHY 03/29/2010   Qualifier: Diagnosis of  By: Walker Kehr MD, Patrick Jupiter    . Persistent headaches 09/06/2013  . Polymyalgia rheumatica ([place]) 11/08/2013  . Presbyesophagus (senile esophageal dysmotility) 06/16/2017  . Presence of permanent cardiac pacemaker   . Rib pain on left side 10/05/2015  . Right knee pain 01/15/2014  . Right rotator cuff tear   .  Rotator cuff tear arthropathy 09/11/2012   Likely related to rotator cuff tendinopathy seen on musculoskeletal ultrasound, and GH DJD.  Complete tear of right supra and infraspinatus on MRI 10/2012     . S/P shoulder replacement 03/20/2015  . Shortness of breath dyspnea   . Spinal stenosis of lumbar region 10/03/2012   Patient has significant low back pain related to spinal stenosis and DJD, DDD.  We had a long discussion about treatment options which are quite limited. She is likely not a good surgical candidate for laminectomy. I am not sure about the usefulness of epidural steroid injection. We'll plan on restarting meloxicam after a long discussion of the pros and cons and risks of chronic kidney disease.  The patient and her family feel that the benefit of good  pain control is the small risk.  Additionally she will followup with her primary care provider for creatinine monitoring.  Will obtain physical therapy for TENS unit trial.  Will refer to pain management for consideration of epidural steroid injection as that worked well in the past for cervical pain Followup here as needed   . Syncope and collapse 02/09/2013  . Urinary frequency 06/05/2015  . Urinary incontinence, mixed 06/29/2006   Qualifier: Diagnosis of  By: Benna Dunks    . UTI (urinary tract infection) 05/27/2016  . UTI (urinary tract infection) 05/2017  . Vertigo 10/18/2010  . Weakness 08/29/2013    Patient Active Problem List   Diagnosis Date Noted  . Hypochloremia   . Pressure ulcer 08/26/2019  . Encounter for care of pacemaker   . Mass of soft tissue of upper arm 10/18/2018  . Hypoproteinemia ([place]) 09/14/2018  . Acute gout of right knee 09/14/2018  . Dependent edema 09/14/2018  . Loss of appetite 09/14/2018  . Ankle edema, bilateral 08/09/2018  . Malnutrition ([place]) 06/29/2018  . Chronic anticoagulation 06/05/2018  . Generalized pain 02/09/2018  . Chronic kidney disease (CKD), stage IV (severe) ([place]) 02/09/2018  .  Bronchiectasis ([place]) 10/03/2017  . Paroxysmal atrial fibrillation ([place]) 10/02/2017  . Complete heart block ([place]) S/P permanent pacemaker 2015 08/30/2017  . Presbyesophagus (senile esophageal dysmotility) 06/16/2017  . Narrowing of Distal Esophagus 06/16/2017  . Generalized osteoarthritis of multiple sites 06/15/2017  . Encounter for long-term opiate analgesic use 04/07/2017  . Weight loss, unintentional 08/12/2016  . Idiopathic gout 05/05/2016  . Collapsed vertebra due to osteoporosis ([place]) 10/06/2015  . Atherosclerosis of aorta ([place]) 10/05/2015  . Calcification of native coronary artery 10/05/2015  . Chronic bilateral thoracic back pain 10/05/2015  . History of Food impaction of esophagus   . Esophageal dysmotility   . Hyperlipidemia 02/27/2015  . GERD (gastroesophageal reflux disease) 07/25/2014  . Impaired mobility and ADLs 05/30/2014  . Chronic pain syndrome 05/09/2014  . Glaucoma 04/18/2014  . Macular degeneration, age related, nonexudative 04/18/2014  . Combined visual hearing impairment 11/12/2013  . History of acute gouty arthritis 09/21/2013  . 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 Chronic diastolic heart failure ([place]) 09/01/2013  . Pacemaker Dual Chamber Medtronic Adapta L ADDR 08/30/2013  . Frailty 08/29/2013  . Constipation 08/23/2013  . Alzheimer's dementia ([place]) 07/30/2013  . Gait instability 08/28/2012  . Xerostomia 01/24/2012  . Insomnia 06/14/2010  . Hereditary and idiopathic peripheral neuropathy 03/29/2010  . Osteoporosis 03/20/2008  . Hypothyroidism 06/29/2006  . Depressive disorder 06/29/2006  . HTN (hypertension) 06/29/2006  . CYSTOCELE/RECTOCELE/PROLAPSE,UNSPEC. 06/29/2006  . Urinary incontinence, mixed 06/29/2006  . Osteoarthritis of left hip 06/29/2006    Past Surgical History:  Procedure Laterality Date  . CARPAL TUNNEL RELEASE Right   . ESOPHAGOGASTRODUODENOSCOPY N/A 09/15/2015   Procedure:  ESOPHAGOGASTRODUODENOSCOPY (EGD);  Surgeon: Clarene Essex, MD;  Location: [facility] ENDOSCOPY;  Service: Endoscopy;  Laterality: N/A;  . EYE SURGERY     cataract, bilat.  Randolm Idol / REPLACE / REMOVE PACEMAKER    . LOOP RECORDER IMPLANT  02-08-13; 08-30-13   MDT LinQ implanted by Dr Lovena Le for syncope, explanted 08-30-13 after CHB identified  . LOOP RECORDER IMPLANT N/A 02/11/2013   Procedure: LOOP RECORDER IMPLANT;  Surgeon: Evans Lance, MD;  Location[facility] CATH LAB;  Service: Cardiovascular;  Laterality: N/A;  . PACEMAKER INSERTION  08-30-2013   MDT ADDRL1 pacemaker implanted by Dr Rayann Heman for CHB  . PERMANENT  PACEMAKER INSERTION N/A 08/30/2013   Procedure: PERMANENT PACEMAKER INSERTION;  Surgeon: Coralyn Mark, MD;  Location: [facility] CATH LAB;  Service: Cardiovascular;  Laterality: N/A;  . REVERSE SHOULDER ARTHROPLASTY Right 03/20/2015   Procedure: RIGHT REVERSE SHOULDER ARTHROPLASTY;  Surgeon: Netta Cedars, MD;  Location: [facility];  Service: Orthopedics;  Laterality: Right;  . TOTAL ABDOMINAL HYSTERECTOMY W/ BILATERAL SALPINGOOPHORECTOMY  03-15-2005     OB History    Gravida  0   Para  0   Term  0   Preterm  0   AB  0   Living        SAB  0   TAB  0   Ectopic  0   Multiple      Live Births              Family History  Problem Relation Age of Onset  . Kidney disease Mother   . Heart attack Neg Hx   . Stroke Neg Hx     Social History   Tobacco Use  . Smoking status: Never Smoker  . Smokeless tobacco: Never Used  Vaping Use  . Vaping Use: Never used  Substance Use Topics  . Alcohol use: No    Alcohol/week: 0.0 standard drinks  . Drug use: No    Home Medications Prior to Admission medications   Medication Sig Start Date End Date Taking? Authorizing Provider  metoprolol succinate (TOPROL-XL) 25 MG 24 hr tablet TAKE 1 TABLET(25 MG) BY MOUTH DAILY 05/13/19   McDiarmid, Blane Ohara, MD  acetaminophen (TYLENOL) 650 MG CR tablet Take 650 mg by mouth 2 (two) times daily as needed for  pain.    [provider]  allopurinol (ZYLOPRIM) 100 MG tablet TAKE 1 TABLET(100 MG) BY MOUTH DAILY 03/13/19   McDiarmid, Blane Ohara, MD  beta carotene w/minerals (OCUVITE) tablet Take 1 tablet by mouth daily. 04/17/14   Coral Spikes, DO  cephALEXin (KEFLEX) 500 MG capsule Take 1 capsule (500 mg total) by mouth 4 (four) times daily for 7 days. 10/09/19 10/16/19  McDiarmid, Blane Ohara, MD  colchicine 0.6 MG tablet TAKE 1 TABLET BY MOUTH TWICE DAILY 08/19/19   McDiarmid, Blane Ohara, MD  cycloSPORINE (RESTASIS) 0.05 % ophthalmic emulsion Place 1 drop into both eyes at bedtime.    [provider]  diclofenac Sodium (VOLTAREN) 1 % GEL SMARTSIG:2 Gram(s) Topical 4 Times Daily PRN 03/13/19   [provider]  docusate sodium (COLACE) 100 MG capsule Take 2 capsules (200 mg total) by mouth 2 (two) times daily. 05/31/18   McDiarmid, Blane Ohara, MD  ELIQUIS 2.5 MG TABS tablet TAKE 1 TABLET(2.5 MG) BY MOUTH TWICE DAILY 10/01/19   Adrian Prows, MD  fesoterodine (TOVIAZ) 4 MG TB24 tablet Take 1 tablet (4 mg total) by mouth daily. 08/22/19   McDiarmid, Blane Ohara, MD  furosemide (LASIX) 20 MG tablet TAKE 1 TABLET BY MOUTH DAILY AS NEEDED FOR SWELLING 04/04/19   Adrian Prows, MD  gabapentin (NEURONTIN) 100 MG capsule TAKE 2 CAPSULES BY MOUTH TWICE DAILY 10/18/18   McDiarmid, Blane Ohara, MD  levothyroxine (SYNTHROID) 100 MCG tablet TAKE 1 TABLET BY MOUTH ONCE DAILY 11/29/18   McDiarmid, Blane Ohara, MD  lidocaine (XYLOCAINE) 5 % ointment Apply 1 application topically daily as needed. 03/06/19   [provider]  mirtazapine (REMERON) 15 MG tablet Take 1 tablet (15 mg total) by mouth at bedtime. 06/10/19   McDiarmid, Blane Ohara, MD  Nutritional Supplement LIQD Take by mouth. 05/06/19  McDiarmid, Blane Ohara, MD  omeprazole (PRILOSEC) 20 MG capsule TAKE 1 CAPSULE(20 MG) BY MOUTH DAILY 08/06/19   McDiarmid, Blane Ohara, MD  ondansetron (ZOFRAN ODT) 4 MG disintegrating tablet Take 1 tablet (4 mg total) by mouth every 8 (eight) hours as needed for  nausea or vomiting. 09/23/19   McDiarmid, Blane Ohara, MD  polyethylene glycol (MIRALAX / GLYCOLAX) packet take 17 grams by mouth once daily Patient taking differently: daily as needed. take 17 grams by mouth once daily 05/31/18   McDiarmid, Blane Ohara, MD  traMADol (ULTRAM) 50 MG tablet TAKE 1/2 TO 1 TABLET(25 TO 50 MG) BY MOUTH EVERY 8 HOURS AS NEEDED FOR MODERATE PAIN 02/12/19   McDiarmid, Blane Ohara, MD  TRAVATAN Z 0.004 % SOLN ophthalmic solution Place 1 drop into both eyes at bedtime.  02/20/13   [provider]  traZODone (DESYREL) 50 MG tablet TAKE 1 TABLET BY MOUTH AT BEDTIME 09/05/19   McDiarmid, Blane Ohara, MD  vitamin B-12 (CYANOCOBALAMIN) 1000 MCG tablet Take 1,000 mcg by mouth daily.    [provider]  Wound Dressings (ALLEVYN GENTLE BORDER HEEL) PADS Apply 2 Units topically once a week. 09/23/19   McDiarmid, Blane Ohara, MD    Allergies    Chlorthalidone, Honey bee treatment [bee venom], Verapamil, Adhesive [tape], and Amlodipine  Review of Systems   Review of Systems  Unable to perform ROS: Other  language barrier and hearing loss  Physical Exam Updated Vital Signs BP (!) 171/60   Pulse 73   Temp 98.1 F (36.7 C) (Oral)   Resp 13   SpO2 98%   Physical Exam Vitals and nursing note reviewed.  Constitutional:      General: She is not in acute distress.    Comments: Thin, frail elderly woman awake and alert  HENT:     Head: Normocephalic and atraumatic.     Mouth/Throat:     Mouth: Mucous membranes are moist.     Pharynx: Oropharynx is clear. Uvula midline. No pharyngeal swelling or posterior oropharyngeal erythema.  Eyes:     Conjunctiva/sclera: Conjunctivae normal.     Pupils: Pupils are equal, round, and reactive to light.  Cardiovascular:     Rate and Rhythm: Normal rate and regular rhythm.     Heart sounds: Normal heart sounds. No murmur heard.   Pulmonary:     Effort: Pulmonary effort is normal.     Comments: Coarse breath sounds b/l Abdominal:     General:  There is no distension.     Palpations: Abdomen is soft.     Tenderness: There is no abdominal tenderness.  Musculoskeletal:     Cervical back: Neck supple.     Comments: Edema, ecchymosis of R lateral malleolus extending onto dorsal foot, normal ROM at ankle, 2+ DP pulse  Skin:    General: Skin is warm and dry.  Neurological:     Mental Status: She is alert.     Comments: Hard of hearing, able to follow basic commands  Psychiatric:        Judgment: Judgment normal.     Comments: anxious     ED Results / Procedures / Treatments   Labs (all labs ordered are listed, but only abnormal results are displayed) Labs Reviewed - No data to display  EKG None  Radiology DG Chest 2 View  Result Date: 10/14/2019 CLINICAL DATA:  Possible foreign body. EXAM: CHEST - 2 VIEW COMPARISON:  April 08, 2018. FINDINGS: The heart size and mediastinal contours  are within normal limits. Both lungs are clear. Left-sided pacemaker is unchanged in position. Status post right shoulder arthroplasty. No radiopaque foreign body is noted. IMPRESSION: No active cardiopulmonary disease. Electronically Signed   By: Marijo Conception M.D.   On: 10/14/2019 20:10   DG Ankle Complete Right  Result Date: 10/14/2019 CLINICAL DATA:  Ankle pain EXAM: RIGHT ANKLE - COMPLETE 3+ VIEW COMPARISON:  Foot radiograph 05/31/2017 FINDINGS: Bones appear osteopenic. Lateral soft tissue swelling is evident. Possible faint nondisplaced fracture lucency at the distal fibula. Ankle mortise is symmetric. Possible lytic change at the distal fifth metatarsal. IMPRESSION: 1. Lateral soft tissue swelling. Possible faint nondisplaced fracture lucency at the distal fibula. 2. Possible lytic change at the distal fifth metatarsal, considered dedicated views of the right foot. Electronically Signed   By: Donavan Foil M.D.   On: 10/14/2019 16:20   DG Foot Complete Right  Result Date: 10/14/2019 CLINICAL DATA:  Evaluate lucency in fifth metatarsal.  EXAM: RIGHT FOOT COMPLETE - 3+ VIEW COMPARISON:  None. FINDINGS: The bones appear diffusely osteopenic. There is no acute fracture or dislocation identified. Chronic healed deformity of the distal aspect of the fifth metatarsal bone. Heel spurs. IMPRESSION: 1. No acute findings. 2. Osteopenia. Electronically Signed   By: Kerby Moors M.D.   On: 10/14/2019 18:31    Procedures Procedures (including critical care time)  Medications Ordered in ED Medications  lidocaine (XYLOCAINE) 2 % viscous mouth solution 15 mL (15 mLs Mouth/Throat Given 10/14/19 1945)    ED Course  I have reviewed the triage vital signs and the nursing notes.  Pertinent imaging results that were available during my care of the patient were reviewed by me and considered in my medical decision making (see chart for details).    MDM Rules/Calculators/A&P                          Pt tolerated water at bedside, I do not feel she has pill impaction but I explained she may have pill esophagitis.  She also may have underlying stricture or other problem causing chronic swallowing problems. CXR negative for aspiration or FB.  I recommended stopping fish oil and any nonessential supplements and switching to soft diet until she can follow-up in the [facility] GI clinic. Cautioned on high-risk foods such as meats.  Regarding her ankle pain, x-ray shows possible faint nondisplaced fracture of distal fibula.  Will place in CAM walker and discussed supportive measures, ortho f/u.  Final Clinical Impression(s) / ED Diagnoses Final diagnoses:  Pill esophagitis  Closed fracture of distal end of right fibula, unspecified fracture morphology, initial encounter    Rx / DC Orders ED Discharge Orders    None       Javionna Leder, Wenda Overland, MD 10/14/19 2106

## 2019-10-14 NOTE — ED Triage Notes (Signed)
Pt arrives to ED w/ c/o throat burning since she took her fish oil pill today at 1300. Pt also c/o R ankle pain since Thursday. EMS VSS.

## 2019-10-14 NOTE — ED Notes (Signed)
Patient verbalizes understanding of discharge instructions. Opportunity for questioning and answers were provided. Armband removed by staff, pt discharged from ED. Pt. ambulatory and discharged home.  

## 2019-10-14 NOTE — Telephone Encounter (Signed)
I am Jordan [name]'s PCP.  I spoke with Jordan [name], daughter of pt, by phone about her mother.    Jordan [name] and her mother are currently at the ED because her mother may have choked on a pill.  This is a presention similar to when her mother had a food impaction a couple of years ago requiring intervention.   Preceding this event, her mother has shown a steady decline over the last several weeks in physical and cognitive level of functioning.  Her appetite has been dwindling with accompanying weight loss.   Additionally, Jordan [name]'s right foot was injured when it became stuck in her WC.  The foot has become red, swollen, warm, and tender.     Should Jordan [name] require hospitalization, the [facility] Medicine Teaching Service will admit.

## 2019-10-14 NOTE — Progress Notes (Signed)
Orthopedic Tech Progress Note Patient Details:  Jordan [name] 09/21/1919 [id number]  Ortho Devices Type of Ortho Device: CAM walker Ortho Device/Splint Location: [facility] Ortho Device/Splint Interventions: Ordered, Application   Post Interventions Patient Tolerated: Well Instructions Provided: Care of device   Berlinda Farve A Estel Scholze 10/14/2019, 8:30 PM

## 2019-10-14 NOTE — Telephone Encounter (Signed)
Patient's daughter calls nurse line regarding right foot injury. Reports foot getting "stuck" in chair and patient pulling on foot and hitting on the chair. Reports swelling and bruising of right foot and ankle. Daughter would like to receive X-ray order. Offered to make appointment, as this is often necessary before orders can be placed.  Daughter would prefer to not come into the office due to difficulties in transportation of patient.   Please advise if x-ray order is appropriate or if patient needs to be seen in the office for this.   To PCP  Talbot Grumbling, RN

## 2019-10-15 ENCOUNTER — Encounter (HOSPITAL_COMMUNITY): Payer: Self-pay | Admitting: Family Medicine

## 2019-10-15 DIAGNOSIS — G8929 Other chronic pain: Secondary | ICD-10-CM | POA: Diagnosis not present

## 2019-10-15 DIAGNOSIS — M545 Low back pain: Secondary | ICD-10-CM | POA: Diagnosis not present

## 2019-10-15 DIAGNOSIS — M109 Gout, unspecified: Secondary | ICD-10-CM | POA: Diagnosis not present

## 2019-10-15 DIAGNOSIS — M546 Pain in thoracic spine: Secondary | ICD-10-CM | POA: Diagnosis not present

## 2019-10-15 DIAGNOSIS — E039 Hypothyroidism, unspecified: Secondary | ICD-10-CM | POA: Diagnosis not present

## 2019-10-15 DIAGNOSIS — L89891 Pressure ulcer of other site, stage 1: Secondary | ICD-10-CM | POA: Diagnosis not present

## 2019-10-16 ENCOUNTER — Emergency Department (HOSPITAL_COMMUNITY): Payer: Medicare Other

## 2019-10-16 ENCOUNTER — Telehealth: Payer: Self-pay | Admitting: *Deleted

## 2019-10-16 ENCOUNTER — Encounter (HOSPITAL_COMMUNITY): Payer: Self-pay | Admitting: Emergency Medicine

## 2019-10-16 ENCOUNTER — Inpatient Hospital Stay (HOSPITAL_COMMUNITY)
Admission: EM | Admit: 2019-10-16 | Discharge: 2019-10-23 | DRG: 177 | Disposition: A | Discharge status: Home / Self Care | Payer: Medicare Other | Attending: Family Medicine | Admitting: Family Medicine

## 2019-10-16 ENCOUNTER — Ambulatory Visit: Payer: Self-pay

## 2019-10-16 DIAGNOSIS — E46 Unspecified protein-calorie malnutrition: Secondary | ICD-10-CM | POA: Diagnosis present

## 2019-10-16 DIAGNOSIS — E162 Hypoglycemia, unspecified: Secondary | ICD-10-CM | POA: Diagnosis not present

## 2019-10-16 DIAGNOSIS — M810 Age-related osteoporosis without current pathological fracture: Secondary | ICD-10-CM | POA: Diagnosis present

## 2019-10-16 DIAGNOSIS — Z950 Presence of cardiac pacemaker: Secondary | ICD-10-CM | POA: Diagnosis not present

## 2019-10-16 DIAGNOSIS — F0280 Dementia in other diseases classified elsewhere without behavioral disturbance: Secondary | ICD-10-CM | POA: Diagnosis present

## 2019-10-16 DIAGNOSIS — K224 Dyskinesia of esophagus: Secondary | ICD-10-CM | POA: Diagnosis present

## 2019-10-16 DIAGNOSIS — X58XXXA Exposure to other specified factors, initial encounter: Secondary | ICD-10-CM | POA: Diagnosis present

## 2019-10-16 DIAGNOSIS — E785 Hyperlipidemia, unspecified: Secondary | ICD-10-CM | POA: Diagnosis present

## 2019-10-16 DIAGNOSIS — D696 Thrombocytopenia, unspecified: Secondary | ICD-10-CM

## 2019-10-16 DIAGNOSIS — G629 Polyneuropathy, unspecified: Secondary | ICD-10-CM | POA: Diagnosis present

## 2019-10-16 DIAGNOSIS — R0602 Shortness of breath: Secondary | ICD-10-CM

## 2019-10-16 DIAGNOSIS — Z20822 Contact with and (suspected) exposure to covid-19: Secondary | ICD-10-CM | POA: Diagnosis present

## 2019-10-16 DIAGNOSIS — S82831A Other fracture of upper and lower end of right fibula, initial encounter for closed fracture: Secondary | ICD-10-CM | POA: Diagnosis present

## 2019-10-16 DIAGNOSIS — D509 Iron deficiency anemia, unspecified: Secondary | ICD-10-CM | POA: Diagnosis present

## 2019-10-16 DIAGNOSIS — Z9842 Cataract extraction status, left eye: Secondary | ICD-10-CM

## 2019-10-16 DIAGNOSIS — Z90710 Acquired absence of both cervix and uterus: Secondary | ICD-10-CM

## 2019-10-16 DIAGNOSIS — Z91030 Bee allergy status: Secondary | ICD-10-CM

## 2019-10-16 DIAGNOSIS — R0902 Hypoxemia: Secondary | ICD-10-CM

## 2019-10-16 DIAGNOSIS — R64 Cachexia: Secondary | ICD-10-CM | POA: Diagnosis present

## 2019-10-16 DIAGNOSIS — G894 Chronic pain syndrome: Secondary | ICD-10-CM | POA: Diagnosis present

## 2019-10-16 DIAGNOSIS — J690 Pneumonitis due to inhalation of food and vomit: Secondary | ICD-10-CM | POA: Diagnosis present

## 2019-10-16 DIAGNOSIS — Z888 Allergy status to other drugs, medicaments and biological substances status: Secondary | ICD-10-CM

## 2019-10-16 DIAGNOSIS — N1832 Chronic kidney disease, stage 3b: Secondary | ICD-10-CM | POA: Diagnosis present

## 2019-10-16 DIAGNOSIS — D649 Anemia, unspecified: Secondary | ICD-10-CM | POA: Diagnosis not present

## 2019-10-16 DIAGNOSIS — Z8673 Personal history of transient ischemic attack (TIA), and cerebral infarction without residual deficits: Secondary | ICD-10-CM

## 2019-10-16 DIAGNOSIS — G8929 Other chronic pain: Secondary | ICD-10-CM | POA: Diagnosis not present

## 2019-10-16 DIAGNOSIS — M545 Low back pain: Secondary | ICD-10-CM | POA: Diagnosis not present

## 2019-10-16 DIAGNOSIS — I480 Paroxysmal atrial fibrillation: Secondary | ICD-10-CM

## 2019-10-16 DIAGNOSIS — R112 Nausea with vomiting, unspecified: Secondary | ICD-10-CM | POA: Diagnosis present

## 2019-10-16 DIAGNOSIS — K5900 Constipation, unspecified: Secondary | ICD-10-CM | POA: Diagnosis present

## 2019-10-16 DIAGNOSIS — G309 Alzheimer's disease, unspecified: Secondary | ICD-10-CM | POA: Diagnosis present

## 2019-10-16 DIAGNOSIS — Z841 Family history of disorders of kidney and ureter: Secondary | ICD-10-CM

## 2019-10-16 DIAGNOSIS — Z8719 Personal history of other diseases of the digestive system: Secondary | ICD-10-CM

## 2019-10-16 DIAGNOSIS — I5032 Chronic diastolic (congestive) heart failure: Secondary | ICD-10-CM

## 2019-10-16 DIAGNOSIS — J9601 Acute respiratory failure with hypoxia: Secondary | ICD-10-CM | POA: Diagnosis present

## 2019-10-16 DIAGNOSIS — T17308A Unspecified foreign body in larynx causing other injury, initial encounter: Secondary | ICD-10-CM

## 2019-10-16 DIAGNOSIS — S82831D Other fracture of upper and lower end of right fibula, subsequent encounter for closed fracture with routine healing: Secondary | ICD-10-CM

## 2019-10-16 DIAGNOSIS — K222 Esophageal obstruction: Secondary | ICD-10-CM | POA: Diagnosis not present

## 2019-10-16 DIAGNOSIS — Z7901 Long term (current) use of anticoagulants: Secondary | ICD-10-CM | POA: Diagnosis not present

## 2019-10-16 DIAGNOSIS — H9190 Unspecified hearing loss, unspecified ear: Secondary | ICD-10-CM | POA: Diagnosis present

## 2019-10-16 DIAGNOSIS — K5901 Slow transit constipation: Secondary | ICD-10-CM

## 2019-10-16 DIAGNOSIS — D539 Nutritional anemia, unspecified: Secondary | ICD-10-CM | POA: Diagnosis present

## 2019-10-16 DIAGNOSIS — L89891 Pressure ulcer of other site, stage 1: Secondary | ICD-10-CM | POA: Diagnosis not present

## 2019-10-16 DIAGNOSIS — E039 Hypothyroidism, unspecified: Secondary | ICD-10-CM | POA: Diagnosis present

## 2019-10-16 DIAGNOSIS — M353 Polymyalgia rheumatica: Secondary | ICD-10-CM | POA: Diagnosis present

## 2019-10-16 DIAGNOSIS — M109 Gout, unspecified: Secondary | ICD-10-CM | POA: Diagnosis not present

## 2019-10-16 DIAGNOSIS — Z79899 Other long term (current) drug therapy: Secondary | ICD-10-CM

## 2019-10-16 DIAGNOSIS — Z96611 Presence of right artificial shoulder joint: Secondary | ICD-10-CM | POA: Diagnosis present

## 2019-10-16 DIAGNOSIS — I130 Hypertensive heart and chronic kidney disease with heart failure and stage 1 through stage 4 chronic kidney disease, or unspecified chronic kidney disease: Secondary | ICD-10-CM | POA: Diagnosis present

## 2019-10-16 DIAGNOSIS — Z6826 Body mass index (BMI) 26.0-26.9, adult: Secondary | ICD-10-CM

## 2019-10-16 DIAGNOSIS — S82401A Unspecified fracture of shaft of right fibula, initial encounter for closed fracture: Secondary | ICD-10-CM

## 2019-10-16 DIAGNOSIS — Z9841 Cataract extraction status, right eye: Secondary | ICD-10-CM

## 2019-10-16 DIAGNOSIS — Z90722 Acquired absence of ovaries, bilateral: Secondary | ICD-10-CM

## 2019-10-16 DIAGNOSIS — F329 Major depressive disorder, single episode, unspecified: Secondary | ICD-10-CM | POA: Diagnosis present

## 2019-10-16 DIAGNOSIS — E038 Other specified hypothyroidism: Secondary | ICD-10-CM | POA: Diagnosis not present

## 2019-10-16 DIAGNOSIS — M546 Pain in thoracic spine: Secondary | ICD-10-CM | POA: Diagnosis not present

## 2019-10-16 DIAGNOSIS — Z79890 Hormone replacement therapy (postmenopausal): Secondary | ICD-10-CM

## 2019-10-16 DIAGNOSIS — R1310 Dysphagia, unspecified: Secondary | ICD-10-CM

## 2019-10-16 DIAGNOSIS — R627 Adult failure to thrive: Secondary | ICD-10-CM | POA: Diagnosis present

## 2019-10-16 LAB — CBC WITH DIFFERENTIAL/PLATELET
Abs Immature Granulocytes: 0.03 10*3/uL (ref 0.00–0.07)
Basophils Absolute: 0 10*3/uL (ref 0.0–0.1)
Basophils Relative: 0 %
Eosinophils Absolute: 0 10*3/uL (ref 0.0–0.5)
Eosinophils Relative: 0 %
HCT: 30 % — ABNORMAL LOW (ref 36.0–46.0)
Hemoglobin: 9.9 g/dL — ABNORMAL LOW (ref 12.0–15.0)
Immature Granulocytes: 0 %
Lymphocytes Relative: 6 %
Lymphs Abs: 0.5 10*3/uL — ABNORMAL LOW (ref 0.7–4.0)
MCH: 33.4 pg (ref 26.0–34.0)
MCHC: 33 g/dL (ref 30.0–36.0)
MCV: 101.4 fL — ABNORMAL HIGH (ref 80.0–100.0)
Monocytes Absolute: 0.9 10*3/uL (ref 0.1–1.0)
Monocytes Relative: 10 %
Neutro Abs: 7.4 10*3/uL (ref 1.7–7.7)
Neutrophils Relative %: 84 %
Platelets: 97 10*3/uL — ABNORMAL LOW (ref 150–400)
RBC: 2.96 MIL/uL — ABNORMAL LOW (ref 3.87–5.11)
RDW: 16.6 % — ABNORMAL HIGH (ref 11.5–15.5)
WBC: 8.7 10*3/uL (ref 4.0–10.5)
nRBC: 0.2 % (ref 0.0–0.2)

## 2019-10-16 LAB — COMPREHENSIVE METABOLIC PANEL
ALT: 27 U/L (ref 0–44)
AST: 37 U/L (ref 15–41)
Albumin: 3 g/dL — ABNORMAL LOW (ref 3.5–5.0)
Alkaline Phosphatase: 177 U/L — ABNORMAL HIGH (ref 38–126)
Anion gap: 9 (ref 5–15)
BUN: 25 mg/dL — ABNORMAL HIGH (ref 8–23)
CO2: 28 mmol/L (ref 22–32)
Calcium: 8.2 mg/dL — ABNORMAL LOW (ref 8.9–10.3)
Chloride: 94 mmol/L — ABNORMAL LOW (ref 98–111)
Creatinine, Ser: 1.05 mg/dL — ABNORMAL HIGH (ref 0.44–1.00)
GFR calc Af Amer: 51 mL/min — ABNORMAL LOW (ref >60)
GFR calc non Af Amer: 44 mL/min — ABNORMAL LOW (ref >60)
Glucose, Bld: 102 mg/dL — ABNORMAL HIGH (ref 70–99)
Potassium: 4.3 mmol/L (ref 3.5–5.1)
Sodium: 131 mmol/L — ABNORMAL LOW (ref 135–145)
Total Bilirubin: 1 mg/dL (ref 0.3–1.2)
Total Protein: 5.9 g/dL — ABNORMAL LOW (ref 6.5–8.1)

## 2019-10-16 LAB — SARS CORONAVIRUS 2 BY RT PCR (HOSPITAL ORDER, PERFORMED IN ~~LOC~~ HOSPITAL LAB): SARS Coronavirus 2: NEGATIVE

## 2019-10-16 LAB — LACTIC ACID, PLASMA: Lactic Acid, Venous: 0.9 mmol/L (ref 0.5–1.9)

## 2019-10-16 LAB — BRAIN NATRIURETIC PEPTIDE: B Natriuretic Peptide: 840.6 pg/mL — ABNORMAL HIGH (ref 0.0–100.0)

## 2019-10-16 MED ORDER — GABAPENTIN 100 MG PO CAPS
200.0000 mg | ORAL_CAPSULE | Freq: Two times a day (BID) | ORAL | Status: DC
  Administered 2019-10-17: 200 mg via ORAL
  Filled 2019-10-16 (×2): qty 2

## 2019-10-16 MED ORDER — SODIUM CHLORIDE 0.9 % IV SOLN
3.0000 g | Freq: Once | INTRAVENOUS | Status: AC
  Administered 2019-10-16: 3 g via INTRAVENOUS
  Filled 2019-10-16: qty 8

## 2019-10-16 MED ORDER — ACETAMINOPHEN 325 MG PO TABS
650.0000 mg | ORAL_TABLET | Freq: Once | ORAL | Status: AC
  Administered 2019-10-16: 650 mg via ORAL
  Filled 2019-10-16: qty 2

## 2019-10-16 MED ORDER — TRAZODONE HCL 50 MG PO TABS
50.0000 mg | ORAL_TABLET | Freq: Every day | ORAL | Status: DC
  Administered 2019-10-17: 50 mg via ORAL
  Filled 2019-10-16 (×2): qty 1

## 2019-10-16 MED ORDER — FESOTERODINE FUMARATE ER 4 MG PO TB24
4.0000 mg | ORAL_TABLET | Freq: Every day | ORAL | Status: DC
  Administered 2019-10-17: 4 mg via ORAL
  Filled 2019-10-16 (×2): qty 1

## 2019-10-16 MED ORDER — TRAMADOL HCL 50 MG PO TABS: 50.0000 mg | ORAL_TABLET | Freq: Four times a day (QID) | ORAL | Status: DC | PRN

## 2019-10-16 MED ORDER — ACETAMINOPHEN 325 MG PO TABS
650.0000 mg | ORAL_TABLET | Freq: Four times a day (QID) | ORAL | Status: DC | PRN
  Administered 2019-10-17: 650 mg via ORAL
  Filled 2019-10-16: qty 2

## 2019-10-16 MED ORDER — APIXABAN 2.5 MG PO TABS
2.5000 mg | ORAL_TABLET | Freq: Two times a day (BID) | ORAL | Status: DC
  Administered 2019-10-17 – 2019-10-18 (×4): 2.5 mg via ORAL
  Filled 2019-10-16 (×4): qty 1

## 2019-10-16 MED ORDER — COLCHICINE 0.6 MG PO TABS
0.6000 mg | ORAL_TABLET | Freq: Two times a day (BID) | ORAL | Status: DC
  Administered 2019-10-17: 0.6 mg via ORAL
  Filled 2019-10-16: qty 1

## 2019-10-16 MED ORDER — SODIUM CHLORIDE 0.9 % IV BOLUS
500.0000 mL | Freq: Once | INTRAVENOUS | Status: AC
  Administered 2019-10-16: 500 mL via INTRAVENOUS

## 2019-10-16 MED ORDER — PANTOPRAZOLE SODIUM 40 MG PO TBEC
40.0000 mg | DELAYED_RELEASE_TABLET | Freq: Every day | ORAL | Status: DC
  Administered 2019-10-17: 40 mg via ORAL
  Filled 2019-10-16: qty 1

## 2019-10-16 MED ORDER — LEVOTHYROXINE SODIUM 100 MCG PO TABS
100.0000 ug | ORAL_TABLET | Freq: Every day | ORAL | Status: DC
  Administered 2019-10-17 – 2019-10-23 (×6): 100 ug via ORAL
  Filled 2019-10-16 (×6): qty 1

## 2019-10-16 MED ORDER — ALLOPURINOL 100 MG PO TABS
100.0000 mg | ORAL_TABLET | Freq: Every day | ORAL | Status: DC
  Administered 2019-10-17: 100 mg via ORAL
  Filled 2019-10-16: qty 1

## 2019-10-16 MED ORDER — DOCUSATE SODIUM 100 MG PO CAPS
200.0000 mg | ORAL_CAPSULE | Freq: Two times a day (BID) | ORAL | Status: DC | PRN
  Administered 2019-10-17: 200 mg via ORAL
  Filled 2019-10-16: qty 2

## 2019-10-16 NOTE — H&P (Addendum)
History and Physical    Jordan [name] QQP:[id number] DOB: Jan 25, 1920 DOA: 10/16/2019  PCP: McDiarmid, Blane Ohara, MD  Patient coming from: Home, accompanied by daughter at bedside  I have personally briefly reviewed patient's old medical records in [place]  Chief Complaint: wheezing and increase fatigue  HPI: Jordan [name] is a 86 y.o. female with medical history significant for paroxysmal atrial fibrillation on Eliquis, bronchiectasis, hypertension, chronic diastolic heart failure, complete heart block s/p PPM, esophageal dysmotility, hypothyroidism and dementia who presents with daughter for concerns of wheezing and cough.  Daughter at bedside provides history as patient is Spanish-speaking only. Patient was recently evaluated in the ED on 6/14 after a choking episode.  Since then patient has had a raspy voice and then today daughter noted wheezing and cough productive of sputum.  She called home health who came to listen to her lungs and heard adventitious sounds and noted her oxygen to be at 80% on room air. Daughter reports that patient has previously had food impaction and esophageal dysfunction and was evaluated by GI in the past.  She is trying to get an outpatient visit with GI at this time.  Also found during her last ED evaluation was a possible faint nondisplaced fracture at the distal right fibula and patient was placed in a cam boot with outpatient Ortho evaluation.  She is still trying to get an outpatient appointment at this time.  Patient is on Eliquis chronically for paroxysmal atrial fibrillation and denies any missed doses so lower suspicion for pulmonary embolism.  In the ED, she was febrile up to 101.4, normotensive and placed on 2 L. No leukocytosis. Stable anemia with Hgb of 9.9. Plt of 97 Na of 131. Creatinine of 1.05.  BNP of 840. CXR shows increase right lower lobe patchy opacity.  Started on IV Unasyn in the ED and given 500cc bolus.   Review of Systems: Unable to  fully obtain due to language barrier.  However daughter denies her having any chest pain, shortness of breath, nausea, vomiting or diarrhea.  Past Medical History:  Diagnosis Date  . Acute hip pain, left 06/29/2018  . Acute pancreatitis 04/05/2018  . AKI (acute kidney injury) ([place])   . Alzheimer's dementia [facility]) 07/30/2013   04/17/14 MoCA - Blind (Spanish version); 9 out of 22.  Correct MoCA = 12 out of 30.    . Arthritis   . Atherosclerosis of aorta ([place]) 10/05/2015  . Back pain 10/05/2015  . Bronchiectasis ([place]) 10/03/2017  . Calcification of native coronary artery 10/05/2015   Chest CT 2011.   . Carpal tunnel syndrome 06/29/2006   Qualifier: Diagnosis of  By: Benna Dunks    . Cervicalgia 05/18/2009   Qualifier: Diagnosis of  By: Walker Kehr MD, Patrick Jupiter    . CHF (congestive heart failure) ([place])   . Chronic bilateral thoracic back pain 10/05/2015  . Chronic constipation   . Chronic diastolic heart failure ([place]) 09/01/2013  . Chronic pain syndrome 05/09/2014  . CKD (chronic kidney disease) stage 3, GFR 30-59 ml/min 09/03/2013  . Combined visual hearing impairment 11/12/2013  . Complete heart block ([place])   . Constipation   . Cryptogenic stroke ([place]) S/P  LOOP RECORDER IMPLANT   DX  01/2013  --  SYNCOPAL EPISODE --  EPISODIC  ATRIAL TACHY/ FIBULATION  AND PAUSES  . CYSTOCELE/RECTOCELE/PROLAPSE,UNSPEC. 06/29/2006   Qualifier: Diagnosis of  By: Benna Dunks    . Depressive disorder 06/29/2006       . Dilation  of esophagus 06/16/2017   Barium Esophagram 06/25/17: Poor esophageal motility. Dilated esophagus with tertiary contractions.  Smooth narrowing distal esophagus similar to the prior study.  Moderate narrowing. Barium tablet passed through this area after several sips of water.  Marland Kitchen Displaced fracture of fifth metatarsal bone, right foot, subsequent encounter for fracture with routine healing 06/05/2017  . Diverticulosis of colon   . DIVERTICULOSIS OF COLON 06/29/2006   Qualifier: Diagnosis of  By: Benna Dunks      . Dry eyes   . Dysphagia 09/15/2015  . Encounter for care of pacemaker   . Fall from bed 03/17/2016  . Fatigue   . Food impaction of esophagus   . Frequency of urination   . Gait instability   . Generalized osteoarthritis of multiple sites 06/15/2017  . Glaucoma 04/18/2014  . Glaucoma of both eyes   . Gout 09/21/2013  . Hand Heaviness 03/17/2016  . Headache   . Hearing loss of aging 05/09/2014  . HERNIA, INCISIONAL VENTRAL W/O OBST/GNGR 10/25/2006   Has been seen by surgery in the past and told that this is not dangerous and should only be treated particularly bothersome    . History of acute gouty arthritis 09/21/2013  . History of colon polyps   . History of Food impaction of esophagus   . HTN (hypertension) 06/29/2006   Isolated Systolic Hypertension - Ambulatory BP  Monitoring result 4/28-29/2015   . Hyperlipidemia 02/27/2015  . Hyperplastic colon polyp 2006   colonoscope  . Hypertension   . Hypoproteinemia ([place]) 09/14/2018  . Hypothyroidism   . Impaired mobility and ADLs 05/30/2014  . Influenza A 05/14/2016  . Insomnia   . INTERSTITIAL CYSTITIS 09/21/2009   Qualifier: Diagnosis of  By: Sarita Haver  MD, Coralyn Mark    . Loss of weight 04/05/2010   Qualifier: Diagnosis of  By: Walker Kehr MD, Patrick Jupiter    . Lumbar stenosis   . Macular degeneration, age related, nonexudative 04/18/2014  . MASS, LUNG 04/05/2010   Qualifier: Diagnosis of  By: Walker Kehr MD, Patrick Jupiter    . Mobitz type 2 second degree heart block 08/29/2013  . Moderate dementia ([place])   . Narrowing of Distal Esophagus 06/16/2017   Barium Esophagram (06/15/17) Poor esophageal motility. Dilated esophagus with tertiary contractions.  Smooth narrowing distal esophagus similar to the prior study.  Moderate narrowing. Barium tablet passed through this area after several sips of water.   . OA (osteoarthritis)    RIGHT SHOULDER AC JOINT  . Orthostasis 04/28/2016  . OSTEOARTHRITIS, MULTI SITES 06/29/2006   Qualifier: Diagnosis of  By: Benna Dunks    .  Osteoporosis   . OSTEOPOROSIS 03/20/2008   Qualifier: Diagnosis of  By: Jerline Pain MD, Anderson Malta    . 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 Peripheral neuropathy   . PERIPHERAL NEUROPATHY 03/29/2010   Qualifier: Diagnosis of  By: Walker Kehr MD, Patrick Jupiter    . Persistent headaches 09/06/2013  . Polymyalgia rheumatica ([place]) 11/08/2013  . Presbyesophagus (senile esophageal dysmotility) 06/16/2017  . Presence of permanent cardiac pacemaker   . Rib pain on left side 10/05/2015  . Right knee pain 01/15/2014  . Right rotator cuff tear   . Rotator cuff tear arthropathy 09/11/2012   Likely related to rotator cuff tendinopathy seen on musculoskeletal ultrasound, and GH DJD.  Complete tear of right supra and infraspinatus on MRI 10/2012     . S/P shoulder replacement 03/20/2015  .  Shortness of breath dyspnea   . Spinal stenosis of lumbar region 10/03/2012   Patient has significant low back pain related to spinal stenosis and DJD, DDD.  We had a long discussion about treatment options which are quite limited. She is likely not a good surgical candidate for laminectomy. I am not sure about the usefulness of epidural steroid injection. We'll plan on restarting meloxicam after a long discussion of the pros and cons and risks of chronic kidney disease.  The patient and her family feel that the benefit of good pain control is the small risk.  Additionally she will followup with her primary care provider for creatinine monitoring.  Will obtain physical therapy for TENS unit trial.  Will refer to pain management for consideration of epidural steroid injection as that worked well in the past for cervical pain Followup here as needed   . Syncope and collapse 02/09/2013  . Urinary frequency 06/05/2015  . Urinary incontinence, mixed 06/29/2006   Qualifier: Diagnosis of  By: Benna Dunks    . UTI (urinary tract infection) 05/27/2016  . UTI  (urinary tract infection) 05/2017  . Vertigo 10/18/2010  . Weakness 08/29/2013    Past Surgical History:  Procedure Laterality Date  . CARPAL TUNNEL RELEASE Right   . ESOPHAGOGASTRODUODENOSCOPY N/A 09/15/2015   Procedure: ESOPHAGOGASTRODUODENOSCOPY (EGD);  Surgeon: Clarene Essex, MD;  Location: [facility] ENDOSCOPY;  Service: Endoscopy;  Laterality: N/A;  . EYE SURGERY     cataract, bilat.  Randolm Idol / REPLACE / REMOVE PACEMAKER    . LOOP RECORDER IMPLANT  02-08-13; 08-30-13   MDT LinQ implanted by Dr Lovena Le for syncope, explanted 08-30-13 after CHB identified  . LOOP RECORDER IMPLANT N/A 02/11/2013   Procedure: LOOP RECORDER IMPLANT;  Surgeon: Evans Lance, MD;  Location: [facility] CATH LAB;  Service: Cardiovascular;  Laterality: N/A;  . PACEMAKER INSERTION  08-30-2013   MDT ADDRL1 pacemaker implanted by Dr Rayann Heman for CHB  . PERMANENT PACEMAKER INSERTION N/A 08/30/2013   Procedure: PERMANENT PACEMAKER INSERTION;  Surgeon: Coralyn Mark, MD;  Location: [place] CATH LAB;  Service: Cardiovascular;  Laterality: N/A;  . REVERSE SHOULDER ARTHROPLASTY Right 03/20/2015   Procedure: RIGHT REVERSE SHOULDER ARTHROPLASTY;  Surgeon: Netta Cedars, MD;  Location: [place];  Service: Orthopedics;  Laterality: Right;  . TOTAL ABDOMINAL HYSTERECTOMY W/ BILATERAL SALPINGOOPHORECTOMY  03-15-2005     reports that she has never smoked. She has never used smokeless tobacco. She reports that she does not drink alcohol and does not use drugs.  Allergies  Allergen Reactions  . Chlorthalidone Other (See Comments)    Hyponatremia  . Honey Bee Treatment [Bee Venom] Swelling and Rash  . Verapamil Other (See Comments)    Bradycardia - HR  35-45 on 40mg  TID  . Adhesive [Tape] Rash and Other (See Comments)    Skin irritation  . Amlodipine Other (See Comments)    Peripheral edema with 2.5mg  daily dose    Family History  Problem Relation Age of Onset  . Kidney disease Mother   . Heart attack Neg Hx   . Stroke Neg Hx      Prior to  Admission medications   Medication Sig Start Date End Date Taking? Authorizing Provider  acetaminophen (TYLENOL) 650 MG CR tablet Take 650 mg by mouth 2 (two) times daily as needed for pain.   Yes [provider]  allopurinol (ZYLOPRIM) 100 MG tablet TAKE 1 TABLET(100 MG) BY MOUTH DAILY Patient taking differently: Take 100 mg by mouth daily.  03/13/19  Yes McDiarmid, Blane Ohara, MD  beta carotene w/minerals (OCUVITE) tablet Take 1 tablet by mouth daily. 04/17/14  Yes Cook, Jayce G, DO  colchicine 0.6 MG tablet TAKE 1 TABLET BY MOUTH TWICE DAILY 08/19/19  Yes McDiarmid, Blane Ohara, MD  cycloSPORINE (RESTASIS) 0.05 % ophthalmic emulsion Place 1 drop into both eyes at bedtime.   Yes [provider]  diclofenac Sodium (VOLTAREN) 1 % GEL Apply 2 g topically 4 (four) times daily.  03/13/19  Yes [provider]  docusate sodium (COLACE) 100 MG capsule Take 2 capsules (200 mg total) by mouth 2 (two) times daily. Patient taking differently: Take 200 mg by mouth 2 (two) times daily as needed for mild constipation.  05/31/18  Yes McDiarmid, Blane Ohara, MD  ELIQUIS 2.5 MG TABS tablet TAKE 1 TABLET(2.5 MG) BY MOUTH TWICE DAILY Patient taking differently: Take 2.5 mg by mouth 2 (two) times daily.  10/01/19  Yes Adrian Prows, MD  fesoterodine (TOVIAZ) 4 MG TB24 tablet Take 1 tablet (4 mg total) by mouth daily. 08/22/19  Yes McDiarmid, Blane Ohara, MD  furosemide (LASIX) 20 MG tablet TAKE 1 TABLET BY MOUTH DAILY AS NEEDED FOR SWELLING Patient taking differently: Take 20 mg by mouth daily as needed for fluid.  04/04/19  Yes Adrian Prows, MD  gabapentin (NEURONTIN) 100 MG capsule TAKE 2 CAPSULES BY MOUTH TWICE DAILY 10/18/18  Yes McDiarmid, Blane Ohara, MD  levothyroxine (SYNTHROID) 100 MCG tablet TAKE 1 TABLET BY MOUTH ONCE DAILY 11/29/18  Yes McDiarmid, Blane Ohara, MD  lidocaine (XYLOCAINE) 2 % solution Use as directed 15 mLs in the mouth or throat every 6 (six) hours as needed for up to 3 days for mouth pain. 10/14/19  10/17/19 Yes Little, Wenda Overland, MD  metoprolol succinate (TOPROL-XL) 25 MG 24 hr tablet TAKE 1 TABLET(25 MG) BY MOUTH DAILY Patient not taking: Reported on 10/16/2019 05/13/19   McDiarmid, Blane Ohara, MD  mirtazapine (REMERON) 15 MG tablet Take 1 tablet (15 mg total) by mouth at bedtime. 06/10/19  Yes McDiarmid, Blane Ohara, MD  omeprazole (PRILOSEC) 20 MG capsule TAKE 1 CAPSULE(20 MG) BY MOUTH DAILY Patient taking differently: Take 20 mg by mouth daily.  08/06/19  Yes McDiarmid, Blane Ohara, MD  ondansetron (ZOFRAN ODT) 4 MG disintegrating tablet Take 1 tablet (4 mg total) by mouth every 8 (eight) hours as needed for nausea or vomiting. 09/23/19  Yes McDiarmid, Blane Ohara, MD  polyethylene glycol (MIRALAX / GLYCOLAX) packet take 17 grams by mouth once daily Patient taking differently: Take 17 g by mouth daily as needed for moderate constipation. take 17 grams by mouth once daily 05/31/18  Yes McDiarmid, Blane Ohara, MD  traMADol (ULTRAM) 50 MG tablet TAKE 1/2 TO 1 TABLET(25 TO 50 MG) BY MOUTH EVERY 8 HOURS AS NEEDED FOR MODERATE PAIN Patient taking differently: Take 50 mg by mouth every 6 (six) hours as needed for moderate pain or severe pain.  02/12/19  Yes McDiarmid, Blane Ohara, MD  TRAVATAN Z 0.004 % SOLN ophthalmic solution Place 1 drop into both eyes at bedtime.  02/20/13  Yes [provider]  traZODone (DESYREL) 50 MG tablet TAKE 1 TABLET BY MOUTH AT BEDTIME 09/05/19  Yes McDiarmid, Blane Ohara, MD  cephALEXin (KEFLEX) 500 MG capsule Take 1 capsule (500 mg total) by mouth 4 (four) times daily for 7 days. Patient not taking: Reported on 10/16/2019 10/09/19 10/16/19  McDiarmid, Blane Ohara, MD  Wound Dressings (ALLEVYN GENTLE BORDER HEEL) PADS Apply 2 Units topically  once a week. 09/23/19   McDiarmid, Blane Ohara, MD    Physical Exam: Vitals:   10/16/19 1942 10/16/19 2100 10/16/19 2135 10/16/19 2300  BP: (!) 131/52 (!) 135/47  121/64  Pulse: 81 76  72  Resp: (!) 21 16  16   Temp:   99.9 F (37.7 C)   TempSrc:   Rectal   SpO2: 98%  98%  98%    Constitutional: NAD, calm, comfortable, chronically ill-appearing thin elderly female laying flat in bed asleep and appears fatigued Vitals:   10/16/19 1942 10/16/19 2100 10/16/19 2135 10/16/19 2300  BP: (!) 131/52 (!) 135/47  121/64  Pulse: 81 76  72  Resp: (!) 21 16  16   Temp:   99.9 F (37.7 C)   TempSrc:   Rectal   SpO2: 98% 98%  98%   Eyes: PERRL, lids and conjunctivae normal ENMT: Mucous membranes are moist.  Neck: normal, supple Respiratory: Crackles heard at left lower lung base on 2 L.  Normal respiratory effort. No accessory muscle use.  Cardiovascular: Regular rate and rhythm, no murmurs / rubs / gallops. No extremity edema.  Abdomen: no tenderness, reducible hernia to the left of the umbilical region bowel sounds positive.  Musculoskeletal: no clubbing / cyanosis. No joint deformity upper and lower extremities. Good ROM, no contractures. Normal muscle tone.  Skin: no rashes, lesions, ulcers. No induration Neurologic: CN 2-12 grossly intact. Sensation intact,  Strength 4/5 in lower extremity. Psychiatric: Normal judgment and insight. Alert and oriented x 3. fatigued    Labs on Admission: I have personally reviewed following labs and imaging studies  CBC: Recent Labs  Lab 10/16/19 1800  WBC 8.7  NEUTROABS 7.4  HGB 9.9*  HCT 30.0*  MCV 101.4*  PLT 97*   Basic Metabolic Panel: Recent Labs  Lab 10/16/19 1800  NA 131*  K 4.3  CL 94*  CO2 28  GLUCOSE 102*  BUN 25*  CREATININE 1.05*  CALCIUM 8.2*   GFR: CrCl cannot be calculated (Unknown ideal weight.). Liver Function Tests: Recent Labs  Lab 10/16/19 1800  AST 37  ALT 27  ALKPHOS 177*  BILITOT 1.0  PROT 5.9*  ALBUMIN 3.0*   No results for input(s): LIPASE, AMYLASE in the last 168 hours. No results for input(s): AMMONIA in the last 168 hours. Coagulation Profile: No results for input(s): INR, PROTIME in the last 168 hours. Cardiac Enzymes: No results for input(s): CKTOTAL, CKMB,  CKMBINDEX, TROPONINI in the last 168 hours. BNP (last 3 results) No results for input(s): PROBNP in the last 8760 hours. HbA1C: No results for input(s): HGBA1C in the last 72 hours. CBG: No results for input(s): GLUCAP in the last 168 hours. Lipid Profile: No results for input(s): CHOL, HDL, LDLCALC, TRIG, CHOLHDL, LDLDIRECT in the last 72 hours. Thyroid Function Tests: No results for input(s): TSH, T4TOTAL, FREET4, T3FREE, THYROIDAB in the last 72 hours. Anemia Panel: No results for input(s): VITAMINB12, FOLATE, FERRITIN, TIBC, IRON, RETICCTPCT in the last 72 hours. Urine analysis:    Component Value Date/Time   COLORURINE AMBER (A) 04/05/2018 1531   APPEARANCEUR CLEAR 04/05/2018 1531   LABSPEC 1.014 04/05/2018 1531   PHURINE 5.0 04/05/2018 1531   GLUCOSEU NEGATIVE 04/05/2018 1531   HGBUR SMALL (A) 04/05/2018 1531   HGBUR small 12/17/2007 1124   BILIRUBINUR negative 02/22/2019 0000   BILIRUBINUR NEG 05/26/2016 1700   KETONESUR negative 02/22/2019 0000   KETONESUR NEGATIVE 04/05/2018 1531   PROTEINUR negative 02/22/2019 0000   PROTEINUR 30 (A)  04/05/2018 1531   UROBILINOGEN 0.2 02/22/2019 0000   UROBILINOGEN 0.2 08/29/2013 2142   NITRITE Negative 02/22/2019 0000   NITRITE NEGATIVE 04/05/2018 1531   LEUKOCYTESUR Moderate (2+) (A) 02/22/2019 0000    Radiological Exams on Admission: DG Chest Port 1 View  Result Date: 10/16/2019 CLINICAL DATA:  Cough with hypoxia. EXAM: PORTABLE CHEST 1 VIEW COMPARISON:  Radiographs 10/14/2019 and 04/08/2018.  CT 04/08/2018. FINDINGS: 1827 hours. Left subclavian pacemaker leads appear unchanged. The heart size and mediastinal contours are stable with aortic atherosclerosis. There are persistent low lung volumes with increased patchy opacity medially at the right lung base. The left lung is clear. No pneumothorax or significant pleural effusion. Previous right shoulder reverse arthroplasty without acute osseous findings. IMPRESSION: Persistent low  lung volumes with increased patchy opacity medially at the right lung base suspicious for atelectasis or developing infiltrate. Electronically Signed   By: Richardean Sale M.D.   On: 10/16/2019 18:52      Assessment/Plan  Acute hypoxic respiratory failure secondary to aspiration pneumonia w/ hx of esophageal dysfunction Admitted on 2 L Continue IV Unasyn agree with outpatient GI appt-will have speech eval while inpatient  Recent nondisplaced fracture at the distal right fibula CAM boot with ambulation continue with outpatient ortho follow up  Thrombocytopenia Possibly reactive.  Although was low in the past.  Continue to monitor with morning CBC.  Chronic anemia Stable  Paroxysmal atrial fibrillation Continue Eliquis  Chronic diastolic heart failure Appears euvolemic  Hypothyroidism Continue Synthroid  DVT prophylaxis:.Eliquis Code Status: Full Family Communication: Plan discussed with daughter at bedside  disposition Plan: Home with at least 2 midnight stays  Consults called:  Admission status: inpatient  Status is: Inpatient  Remains inpatient appropriate because:Inpatient level of care appropriate due to severity of illness   Dispo: The patient is from: Home              Anticipated d/c is to: Home              Anticipated d/c date is: 3 days              Patient currently is not medically stable to d/c.         Orene Desanctis DO [facility]   If 7PM-7AM, please contact night-coverage [email or web address]   10/16/2019, 11:48 PM

## 2019-10-16 NOTE — Chronic Care Management (AMB) (Signed)
  Care Management   Follow Up Note   10/16/2019 Name: Jordan [name] MRN: [id number] DOB: Jul 23, 1919  Referred by: McDiarmid, Blane Ohara, MD Reason for referral : Care Coordination (Care Management RNCM Silicon Pads)   Jordan [name] is a 86 y.o. year old female who is a primary care patient of McDiarmid, Blane Ohara, MD. The care management team was consulted for assistance with care management and care coordination needs.    Review of patient status, including review of consultants reports, relevant laboratory and other test results, and collaboration with appropriate care team members and the patient's provider was performed as part of comprehensive patient evaluation and provision of chronic care management services.    SDOH (Social Determinants of Health) assessments performed: No See Care Plan activities for detailed interventions related to [facility])     Advanced Directives: See Care Plan and Vynca application for related entries.   Goals Addressed              This Visit's Progress   .  I need silicon pads for my back (pt-stated)        CARE PLAN ENTRY (see longitudinal plan of care for additional care plan information)  Current Barriers:  . Care Coordination needs related to DME in a patient with Malnutrition, frailty and Unintentional weight loss.  Nurse Case Manager Clinical Goal(s):  Marland Kitchen Over the next 14 days, patient will verbalize understanding of plan for silicon adhesive pads  Interventions:  . Inter-disciplinary care team collaboration (see longitudinal plan of care) . Evaluation of current treatment plan related to silicon pads and patient's adherence to plan as established by provider. . Advised patient advised the patient that I would contact [facility] to find out any information on the silicon pads . Discussed plans with patient for ongoing care management follow up and provided patient with direct contact information for care management team . Daughter states that due to  her mother's fraile nature she uses the pads to put on her coccyx to help deter any skin breakdown. . 10/16/19 . Spoke with Jordan [name] she called to let me know that she had not heard anything from [facility] about the prescription for her mother's silicon pads.  I assured Jordan [name] that the information had been given to [facility] an I had received a receipt from them on 6.7.21.   . I called Jordan [name] this morning who connected me with Jordan [name] with [facility] .  He stated that he would call Jordan [name] and explain the situation to her.  Patient Self Care Activities:  . Patient/daughter verbalizes understanding of plan  . Attends all scheduled provider appointments . Calls provider office for new concerns or questions . Unable to independently self manage self navigation for DME  Please see past updates related to this goal by clicking on the "Past Updates" button in the selected goal          RNCM will follow up with the patient within the next month of  July   Jordan Kleven RN, BSN, [place] Phone: [phone] Fax: [phone]

## 2019-10-16 NOTE — ED Triage Notes (Signed)
Pt BIB EMS from home c/o low 02 levels and cough. Pt was seen at [facility] on Monday after choking on a pill. Family is now concerned for aspiration. EMS ausculted rhonchi. A&O at baseline.   spo2 91% on RA; 97% on 3L

## 2019-10-16 NOTE — Telephone Encounter (Signed)
Jordan [name] from [facility] calls requesting a verbal order for mobile xray for patient due to bilateral crackles.  When I called her back, they have actually called EMS because her stats were dropping.  No need for verbal orders at this time. Christen Bame, CMA

## 2019-10-16 NOTE — Patient Instructions (Signed)
Visit Information  Goals Addressed              This Visit's Progress   .  I need silicon pads for my back (pt-stated)        CARE PLAN ENTRY (see longitudinal plan of care for additional care plan information)  Current Barriers:  . Care Coordination needs related to DME in a patient with Malnutrition, frailty and Unintentional weight loss.  Nurse Case Manager Clinical Goal(s):  Jordan Kitchen Over the next 14 days, patient will verbalize understanding of plan for silicon adhesive pads  Interventions:  . Inter-disciplinary care team collaboration (see longitudinal plan of care) . Evaluation of current treatment plan related to silicon pads and patient's adherence to plan as established by provider. . Advised patient advised the patient that I would contact [facility] to find out any information on the silicon pads . Discussed plans with patient for ongoing care management follow up and provided patient with direct contact information for care management team . Daughter states that due to her mother's fraile nature she uses the pads to put on her coccyx to help deter any skin breakdown. . 10/16/19 . Spoke with Mrs Jordan [name] she called to let me know that she had not heard anything from [facility] about the prescription for her mother's silicon pads.  I assured Jordan [name] that the information had been given to [facility] an I had received a receipt from them on 6.7.21.   . I called Jordan [name] this morning who connected me with Jordan [name] with [facility] .  He stated that he would call Mrs Jordan [name] and explain the situation to her.  Patient Self Care Activities:  . Patient/daughter verbalizes understanding of plan  . Attends all scheduled provider appointments . Calls provider office for new concerns or questions . Unable to independently self manage self navigation for DME  Please see past updates related to this goal by clicking on the "Past Updates" button in the selected  goal         [name] was given information about Care Management services today including:  1. Care Management services include personalized support from designated clinical staff supervised by her physician, including individualized plan of care and coordination with other care providers 2. 24/7 contact phone numbers for assistance for urgent and routine care needs. 3. The patient may stop CCM services at any time (effective at the end of the month) by phone call to the office staff.  Patient agreed to services and verbal consent obtained.   The patient verbalized understanding of instructions provided today and declined a print copy of patient instruction materials.   RNCM will follow up with the patient within the next month of July  [name] RN, BSN, [facility] Care Management Coordinator [place] Phone: [phone] Fax: [phone]

## 2019-10-16 NOTE — ED Provider Notes (Signed)
[facility] DEPT Provider Note   CSN: [id number] Arrival date & time: 10/16/19  1646     History No chief complaint on file.   Jordan [name] is a 86 y.o. female with a medical history significant for Alzheimer's dementia, bronchiectasis, CKD IV, HFpEF, HTN, and Afib/compelete heart block with pacemaker in place/on eliquis who presents for evaluation of low oxygen.  Daughter is at bedside and provides translation, patient is Spanish-speaking only.    Reports after recently being seen in the ED on 6/14 due to a pill choking episode, she has had increasing nonproductive sputum and intermittent shortness of breath.  She was evaluated by her home health today who noted crackles and that her oxygen saturation was in the low 80s.  Temperature 99.7 F at home, normally 74F.  She has had some chills. She also recently sustained a nondisplaced fracture of her distal fibula and is in a right boot.  She has been largely immobile since that time.  Denies any lower leg swelling or weight gain.  On arrival, her sats on room air were 85%.  She was placed on 2-3 L with appropriate oxygen saturations.  Does not use oxygen at baseline.     Past Medical History:  Diagnosis Date  . Acute hip pain, left 06/29/2018  . Acute pancreatitis 04/05/2018  . AKI (acute kidney injury) ([place])   . Alzheimer's dementia [facility]) 07/30/2013   04/17/14 MoCA - Blind (Spanish version); 9 out of 22.  Correct MoCA = 12 out of 30.    . Arthritis   . Atherosclerosis of aorta ([place]) 10/05/2015  . Back pain 10/05/2015  . Bronchiectasis ([place]) 10/03/2017  . Calcification of native coronary artery 10/05/2015   Chest CT 2011.   . Carpal tunnel syndrome 06/29/2006   Qualifier: Diagnosis of  By: Benna Dunks    . Cervicalgia 05/18/2009   Qualifier: Diagnosis of  By: Walker Kehr MD, [place] Jupiter    . CHF (congestive heart failure) ([place])   . Chronic bilateral thoracic back pain 10/05/2015  . Chronic constipation   . Chronic diastolic  heart failure ([place]) 09/01/2013  . Chronic pain syndrome 05/09/2014  . CKD (chronic kidney disease) stage 3, GFR 30-59 ml/min 09/03/2013  . Combined visual hearing impairment 11/12/2013  . Complete heart block ([place])   . Constipation   . Cryptogenic stroke ([place]) S/P  LOOP RECORDER IMPLANT   DX  01/2013  --  SYNCOPAL EPISODE --  EPISODIC  ATRIAL TACHY/ FIBULATION  AND PAUSES  . CYSTOCELE/RECTOCELE/PROLAPSE,UNSPEC. 06/29/2006   Qualifier: Diagnosis of  By: Benna Dunks    . Depressive disorder 06/29/2006       . Dilation of esophagus 06/16/2017   Barium Esophagram 06/25/17: Poor esophageal motility. Dilated esophagus with tertiary contractions.  Smooth narrowing distal esophagus similar to the prior study.  Moderate narrowing. Barium tablet passed through this area after several sips of water.  Marland Kitchen Displaced fracture of fifth metatarsal bone, right foot, subsequent encounter for fracture with routine healing 06/05/2017  . Diverticulosis of colon   . DIVERTICULOSIS OF COLON 06/29/2006   Qualifier: Diagnosis of  By: Benna Dunks    . Dry eyes   . Dysphagia 09/15/2015  . Encounter for care of pacemaker   . Fall from bed 03/17/2016  . Fatigue   . Food impaction of esophagus   . Frequency of urination   . Gait instability   . Generalized osteoarthritis of multiple sites 06/15/2017  . Glaucoma 04/18/2014  . Glaucoma of  both eyes   . Gout 09/21/2013  . Hand Heaviness 03/17/2016  . Headache   . Hearing loss of aging 05/09/2014  . HERNIA, INCISIONAL VENTRAL W/O OBST/GNGR 10/25/2006   Has been seen by surgery in the past and told that this is not dangerous and should only be treated particularly bothersome    . History of acute gouty arthritis 09/21/2013  . History of colon polyps   . History of Food impaction of esophagus   . HTN (hypertension) 06/29/2006   Isolated Systolic Hypertension - Ambulatory BP  Monitoring result 4/28-29/2015   . Hyperlipidemia 02/27/2015  . Hyperplastic colon polyp 2006   colonoscope    . Hypertension   . Hypoproteinemia ([place]) 09/14/2018  . Hypothyroidism   . Impaired mobility and ADLs 05/30/2014  . Influenza A 05/14/2016  . Insomnia   . INTERSTITIAL CYSTITIS 09/21/2009   Qualifier: Diagnosis of  By: Sarita Haver  MD, Coralyn Mark    . Loss of weight 04/05/2010   Qualifier: Diagnosis of  By: Walker Kehr MD, Patrick Jupiter    . Lumbar stenosis   . Macular degeneration, age related, nonexudative 04/18/2014  . MASS, LUNG 04/05/2010   Qualifier: Diagnosis of  By: Walker Kehr MD, Patrick Jupiter    . Mobitz type 2 second degree heart block 08/29/2013  . Moderate dementia ([place])   . Narrowing of Distal Esophagus 06/16/2017   Barium Esophagram (06/15/17) Poor esophageal motility. Dilated esophagus with tertiary contractions.  Smooth narrowing distal esophagus similar to the prior study.  Moderate narrowing. Barium tablet passed through this area after several sips of water.   . OA (osteoarthritis)    RIGHT SHOULDER AC JOINT  . Orthostasis 04/28/2016  . OSTEOARTHRITIS, MULTI SITES 06/29/2006   Qualifier: Diagnosis of  By: Benna Dunks    . Osteoporosis   . OSTEOPOROSIS 03/20/2008   Qualifier: Diagnosis of  By: Jerline Pain MD, Anderson Malta    . 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 Peripheral neuropathy   . PERIPHERAL NEUROPATHY 03/29/2010   Qualifier: Diagnosis of  By: Walker Kehr MD, Patrick Jupiter    . Persistent headaches 09/06/2013  . Polymyalgia rheumatica ([place]) 11/08/2013  . Presbyesophagus (senile esophageal dysmotility) 06/16/2017  . Presence of permanent cardiac pacemaker   . Rib pain on left side 10/05/2015  . Right knee pain 01/15/2014  . Right rotator cuff tear   . Rotator cuff tear arthropathy 09/11/2012   Likely related to rotator cuff tendinopathy seen on musculoskeletal ultrasound, and GH DJD.  Complete tear of right supra and infraspinatus on MRI 10/2012     . S/P shoulder replacement 03/20/2015  . Shortness of breath dyspnea   . Spinal  stenosis of lumbar region 10/03/2012   Patient has significant low back pain related to spinal stenosis and DJD, DDD.  We had a long discussion about treatment options which are quite limited. She is likely not a good surgical candidate for laminectomy. I am not sure about the usefulness of epidural steroid injection. We'll plan on restarting meloxicam after a long discussion of the pros and cons and risks of chronic kidney disease.  The patient and her family feel that the benefit of good pain control is the small risk.  Additionally she will followup with her primary care provider for creatinine monitoring.  Will obtain physical therapy for TENS unit trial.  Will refer to pain management for consideration of epidural steroid injection as that worked well in the past  for cervical pain Followup here as needed   . Syncope and collapse 02/09/2013  . Urinary frequency 06/05/2015  . Urinary incontinence, mixed 06/29/2006   Qualifier: Diagnosis of  By: Benna Dunks    . UTI (urinary tract infection) 05/27/2016  . UTI (urinary tract infection) 05/2017  . Vertigo 10/18/2010  . Weakness 08/29/2013    Patient Active Problem List   Diagnosis Date Noted  . Aspiration pneumonia ([place]) 10/16/2019  . Closed fracture of distal fibula, Possible 10/14/2019  . Hypochloremia   . Pressure ulcer 08/26/2019  . Encounter for care of pacemaker   . Mass of soft tissue of upper arm 10/18/2018  . Acute gout of right knee 09/14/2018  . Dependent edema 09/14/2018  . Loss of appetite 09/14/2018  . Ankle edema, bilateral 08/09/2018  . Malnutrition ([place]) 06/29/2018  . Chronic anticoagulation 06/05/2018  . Generalized pain 02/09/2018  . Chronic kidney disease (CKD), stage IV (severe) ([place]) 02/09/2018  . Bronchiectasis ([place]) 10/03/2017  . Paroxysmal atrial fibrillation ([place]) 10/02/2017  . Complete heart block ([place]) S/P permanent pacemaker 2015 08/30/2017  . Presbyesophagus (senile esophageal dysmotility) 06/16/2017  .  Narrowing of Distal Esophagus 06/16/2017  . Generalized osteoarthritis of multiple sites 06/15/2017  . Encounter for long-term opiate analgesic use 04/07/2017  . Weight loss, unintentional 08/12/2016  . Idiopathic gout 05/05/2016  . Collapsed vertebra due to osteoporosis ([place]) 10/06/2015  . Atherosclerosis of aorta ([place]) 10/05/2015  . Calcification of native coronary artery 10/05/2015  . Chronic bilateral thoracic back pain 10/05/2015  . History of Food impaction of esophagus   . Esophageal dysmotility   . Hyperlipidemia 02/27/2015  . GERD (gastroesophageal reflux disease) 07/25/2014  . Impaired mobility and ADLs 05/30/2014  . Chronic pain syndrome 05/09/2014  . Glaucoma 04/18/2014  . Macular degeneration, age related, nonexudative 04/18/2014  . Combined visual hearing impairment 11/12/2013  . History of acute gouty arthritis 09/21/2013  . 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 Chronic diastolic heart failure ([place]) 09/01/2013  . Pacemaker Dual Chamber Medtronic Adapta L ADDR 08/30/2013  . Frailty 08/29/2013  . Constipation 08/23/2013  . Alzheimer's dementia ([place]) 07/30/2013  . Gait instability 08/28/2012  . Xerostomia 01/24/2012  . Insomnia 06/14/2010  . Hereditary and idiopathic peripheral neuropathy 03/29/2010  . Osteoporosis 03/20/2008  . Hypothyroidism 06/29/2006  . Depressive disorder 06/29/2006  . HTN (hypertension) 06/29/2006  . CYSTOCELE/RECTOCELE/PROLAPSE,UNSPEC. 06/29/2006  . Urinary incontinence, mixed 06/29/2006  . Osteoarthritis of left hip 06/29/2006    Past Surgical History:  Procedure Laterality Date  . CARPAL TUNNEL RELEASE Right   . ESOPHAGOGASTRODUODENOSCOPY N/A 09/15/2015   Procedure: ESOPHAGOGASTRODUODENOSCOPY (EGD);  Surgeon: Clarene Essex, MD;  Location[facility] ENDOSCOPY;  Service: Endoscopy;  Laterality: N/A;  . EYE SURGERY     cataract, bilat.  Randolm Idol / REPLACE / REMOVE PACEMAKER    . LOOP RECORDER  IMPLANT  02-08-13; 08-30-13   MDT LinQ implanted by Dr Lovena Le for syncope, explanted 08-30-13 after CHB identified  . LOOP RECORDER IMPLANT N/A 02/11/2013   Procedure: LOOP RECORDER IMPLANT;  Surgeon: Evans Lance, MD;  Location: [facility] CATH LAB;  Service: Cardiovascular;  Laterality: N/A;  . PACEMAKER INSERTION  08-30-2013   MDT ADDRL1 pacemaker implanted by Dr Rayann Heman for CHB  . PERMANENT PACEMAKER INSERTION N/A 08/30/2013   Procedure: PERMANENT PACEMAKER INSERTION;  Surgeon: Coralyn Mark, MD;  Location: [facility] CATH LAB;  Service: Cardiovascular;  Laterality: N/A;  . REVERSE SHOULDER ARTHROPLASTY Right 03/20/2015   Procedure:  RIGHT REVERSE SHOULDER ARTHROPLASTY;  Surgeon: Netta Cedars, MD;  Location: [facility];  Service: Orthopedics;  Laterality: Right;  . TOTAL ABDOMINAL HYSTERECTOMY W/ BILATERAL SALPINGOOPHORECTOMY  03-15-2005     OB History    Gravida  0   Para  0   Term  0   Preterm  0   AB  0   Living        SAB  0   TAB  0   Ectopic  0   Multiple      Live Births              Family History  Problem Relation Age of Onset  . Kidney disease Mother   . Heart attack Neg Hx   . Stroke Neg Hx     Social History   Tobacco Use  . Smoking status: Never Smoker  . Smokeless tobacco: Never Used  Vaping Use  . Vaping Use: Never used  Substance Use Topics  . Alcohol use: No    Alcohol/week: 0.0 standard drinks  . Drug use: No    Home Medications Prior to Admission medications   Medication Sig Start Date End Date Taking? Authorizing Provider  acetaminophen (TYLENOL) 650 MG CR tablet Take 650 mg by mouth 2 (two) times daily as needed for pain.   Yes [provider]  allopurinol (ZYLOPRIM) 100 MG tablet TAKE 1 TABLET(100 MG) BY MOUTH DAILY Patient taking differently: Take 100 mg by mouth daily.  03/13/19  Yes McDiarmid, Blane Ohara, MD  beta carotene w/minerals (OCUVITE) tablet Take 1 tablet by mouth daily. 04/17/14  Yes Cook, Jayce G, DO  colchicine 0.6 MG tablet TAKE  1 TABLET BY MOUTH TWICE DAILY 08/19/19  Yes McDiarmid, Blane Ohara, MD  cycloSPORINE (RESTASIS) 0.05 % ophthalmic emulsion Place 1 drop into both eyes at bedtime.   Yes [provider]  diclofenac Sodium (VOLTAREN) 1 % GEL Apply 2 g topically 4 (four) times daily.  03/13/19  Yes [provider]  docusate sodium (COLACE) 100 MG capsule Take 2 capsules (200 mg total) by mouth 2 (two) times daily. Patient taking differently: Take 200 mg by mouth 2 (two) times daily as needed for mild constipation.  05/31/18  Yes McDiarmid, Blane Ohara, MD  ELIQUIS 2.5 MG TABS tablet TAKE 1 TABLET(2.5 MG) BY MOUTH TWICE DAILY Patient taking differently: Take 2.5 mg by mouth 2 (two) times daily.  10/01/19  Yes Adrian Prows, MD  fesoterodine (TOVIAZ) 4 MG TB24 tablet Take 1 tablet (4 mg total) by mouth daily. 08/22/19  Yes McDiarmid, Blane Ohara, MD  furosemide (LASIX) 20 MG tablet TAKE 1 TABLET BY MOUTH DAILY AS NEEDED FOR SWELLING Patient taking differently: Take 20 mg by mouth daily as needed for fluid.  04/04/19  Yes Adrian Prows, MD  gabapentin (NEURONTIN) 100 MG capsule TAKE 2 CAPSULES BY MOUTH TWICE DAILY 10/18/18  Yes McDiarmid, Blane Ohara, MD  levothyroxine (SYNTHROID) 100 MCG tablet TAKE 1 TABLET BY MOUTH ONCE DAILY 11/29/18  Yes McDiarmid, Blane Ohara, MD  lidocaine (XYLOCAINE) 2 % solution Use as directed 15 mLs in the mouth or throat every 6 (six) hours as needed for up to 3 days for mouth pain. 10/14/19 10/17/19 Yes Little, Wenda Overland, MD  metoprolol succinate (TOPROL-XL) 25 MG 24 hr tablet TAKE 1 TABLET(25 MG) BY MOUTH DAILY Patient not taking: Reported on 10/16/2019 05/13/19   McDiarmid, Blane Ohara, MD  mirtazapine (REMERON) 15 MG tablet Take 1 tablet (15 mg total) by mouth at  bedtime. 06/10/19  Yes McDiarmid, Blane Ohara, MD  omeprazole (PRILOSEC) 20 MG capsule TAKE 1 CAPSULE(20 MG) BY MOUTH DAILY Patient taking differently: Take 20 mg by mouth daily.  08/06/19  Yes McDiarmid, Blane Ohara, MD  ondansetron (ZOFRAN ODT) 4 MG disintegrating  tablet Take 1 tablet (4 mg total) by mouth every 8 (eight) hours as needed for nausea or vomiting. 09/23/19  Yes McDiarmid, Blane Ohara, MD  polyethylene glycol (MIRALAX / GLYCOLAX) packet take 17 grams by mouth once daily Patient taking differently: Take 17 g by mouth daily as needed for moderate constipation. take 17 grams by mouth once daily 05/31/18  Yes McDiarmid, Blane Ohara, MD  traMADol (ULTRAM) 50 MG tablet TAKE 1/2 TO 1 TABLET(25 TO 50 MG) BY MOUTH EVERY 8 HOURS AS NEEDED FOR MODERATE PAIN Patient taking differently: Take 50 mg by mouth every 6 (six) hours as needed for moderate pain or severe pain.  02/12/19  Yes McDiarmid, Blane Ohara, MD  TRAVATAN Z 0.004 % SOLN ophthalmic solution Place 1 drop into both eyes at bedtime.  02/20/13  Yes [provider]  traZODone (DESYREL) 50 MG tablet TAKE 1 TABLET BY MOUTH AT BEDTIME 09/05/19  Yes McDiarmid, Blane Ohara, MD  cephALEXin (KEFLEX) 500 MG capsule Take 1 capsule (500 mg total) by mouth 4 (four) times daily for 7 days. Patient not taking: Reported on 10/16/2019 10/09/19 10/16/19  McDiarmid, Blane Ohara, MD  Wound Dressings (ALLEVYN GENTLE BORDER HEEL) PADS Apply 2 Units topically once a week. 09/23/19   McDiarmid, Blane Ohara, MD    Allergies    Chlorthalidone, Honey bee treatment [bee venom], Verapamil, Adhesive [tape], and Amlodipine  Review of Systems   Review of Systems  Constitutional: Positive for appetite change, chills, fatigue and fever.  HENT: Positive for trouble swallowing.   Respiratory: Positive for cough, choking and shortness of breath.   Cardiovascular: Negative for chest pain, palpitations and leg swelling.  Genitourinary: Negative for dysuria.  Skin: Negative for rash.  Neurological: Negative for dizziness, light-headedness and headaches.    Physical Exam Updated Vital Signs BP (!) 135/47   Pulse 76   Temp 99.9 F (37.7 C) (Rectal)   Resp 16   SpO2 98%   Physical Exam Constitutional:      General: She is not in acute distress.     Comments: Cachectic older female with daughter at bedside  HENT:     Head: Normocephalic and atraumatic.     Comments: Eyes sunken.     Mouth/Throat:     Mouth: Mucous membranes are dry.  Eyes:     Extraocular Movements: Extraocular movements intact.  Neck:     Comments: No JVD present Cardiovascular:     Rate and Rhythm: Normal rate and regular rhythm.     Pulses: Normal pulses.  Pulmonary:     Effort: Pulmonary effort is normal. No respiratory distress.     Comments: Rhonchi bibasilar, but predominantly in right lower lobe.  No accessory muscle use, breathing comfortably.  Satting 94% on 2L. Abdominal:     Palpations: Abdomen is soft.  Musculoskeletal:     Comments: Right lower leg in hard boot.  No left lower extremity edema.  Palpable dorsalis pedis pulses on the left.  Skin:    General: Skin is dry.  Neurological:     General: No focal deficit present.     Mental Status: She is alert.  Psychiatric:        Mood and Affect: Mood normal.  Behavior: Behavior normal.     ED Results / Procedures / Treatments   Labs (all labs ordered are listed, but only abnormal results are displayed) Labs Reviewed  CBC WITH DIFFERENTIAL/PLATELET - Abnormal; Notable for the following components:      Result Value   RBC 2.96 (*)    Hemoglobin 9.9 (*)    HCT 30.0 (*)    MCV 101.4 (*)    RDW 16.6 (*)    Platelets 97 (*)    Lymphs Abs 0.5 (*)    All other components within normal limits  COMPREHENSIVE METABOLIC PANEL - Abnormal; Notable for the following components:   Sodium 131 (*)    Chloride 94 (*)    Glucose, Bld 102 (*)    BUN 25 (*)    Creatinine, Ser 1.05 (*)    Calcium 8.2 (*)    Total Protein 5.9 (*)    Albumin 3.0 (*)    Alkaline Phosphatase 177 (*)    GFR calc non Af Amer 44 (*)    GFR calc Af Amer 51 (*)    All other components within normal limits  BRAIN NATRIURETIC PEPTIDE - Abnormal; Notable for the following components:   B Natriuretic Peptide 840.6 (*)     All other components within normal limits  SARS CORONAVIRUS 2 BY RT PCR (HOSPITAL ORDER, [facility])  CULTURE, BLOOD (ROUTINE X 2)  CULTURE, BLOOD (ROUTINE X 2)  LACTIC ACID, PLASMA  LACTIC ACID, PLASMA    EKG None  Radiology DG Chest Port 1 View  Result Date: 10/16/2019 CLINICAL DATA:  Cough with hypoxia. EXAM: PORTABLE CHEST 1 VIEW COMPARISON:  Radiographs 10/14/2019 and 04/08/2018.  CT 04/08/2018. FINDINGS: 1827 hours. Left subclavian pacemaker leads appear unchanged. The heart size and mediastinal contours are stable with aortic atherosclerosis. There are persistent low lung volumes with increased patchy opacity medially at the right lung base. The left lung is clear. No pneumothorax or significant pleural effusion. Previous right shoulder reverse arthroplasty without acute osseous findings. IMPRESSION: Persistent low lung volumes with increased patchy opacity medially at the right lung base suspicious for atelectasis or developing infiltrate. Electronically Signed   By: Richardean Sale M.D.   On: 10/16/2019 18:52    Procedures Procedures (including critical care time)  Medications Ordered in ED Medications  acetaminophen (TYLENOL) tablet 650 mg (has no administration in time range)  sodium chloride 0.9 % bolus 500 mL (0 mLs Intravenous Stopped 10/16/19 1915)  Ampicillin-Sulbactam (UNASYN) 3 g in sodium chloride 0.9 % 100 mL IVPB (0 g Intravenous Stopped 10/16/19 1916)  acetaminophen (TYLENOL) tablet 650 mg (650 mg Oral Given 10/16/19 1916)    ED Course  I have reviewed the triage vital signs and the nursing notes.  Pertinent labs & imaging results that were available during my care of the patient were reviewed by me and considered in my medical decision making (see chart for details).    MDM Rules/Calculators/A&P                          86 year old female with a history of Alzheimer's dementia, HFpEF, and recent evaluation for pill esophagitis and  choking episode on 6/14 presenting for evaluation of worsening shortness of breath and hypoxia.  On arrival, she was noted to be satting 85% on room air and required 2 L [place] for resolution.  Reassuringly without labored breathing, but notable rhonchi in the right lower lobe.  Largely suspect  aspiration pneumonia in the setting of recent choking episode given her symptomatology, becoming increasingly febrile, and chest x-ray confirming RLL opacity consistent with physical exam.  She fortunately does not appear septic, lactic acid 0.9, but blood cultures collected. Considered pulmonary embolism given recent fibula fracture and immobilization, however given findings above and compliant with Eliquis suspect this is less likely. Wells PE score of 1.5, low likelihood. CMP remarkable for NA 131, otherwise creatinine and alkaline phosphatase are around baseline.  Suspect hypovolemic hyponatremia. Hgb 9.9 (BL 9-11), no leukocytosis. Given IV Unasyn and IV fluids while in the ED.   Given her new oxygen requirement and advanced age, will consult for hospitalist admission for continued management.  Hospitalist team will admit.  BNP resulted after admission, elevated at 840.  While she otherwise clinically appears hypovolemic, could certainly consider pulmonary edema contributing to her current hypoxemia.    Final Clinical Impression(s) / ED Diagnoses Final diagnoses:  Aspiration pneumonia of lower lobe, unspecified aspiration pneumonia type, unspecified laterality [facility])  Hypoxia    Rx / DC Orders ED Discharge Orders    None       Patriciaann Clan, DO 10/16/19 2304    Drenda Freeze, MD 10/17/19 [id number]

## 2019-10-17 ENCOUNTER — Other Ambulatory Visit: Payer: Self-pay

## 2019-10-17 ENCOUNTER — Encounter (HOSPITAL_COMMUNITY): Payer: Self-pay | Admitting: Family Medicine

## 2019-10-17 ENCOUNTER — Inpatient Hospital Stay (HOSPITAL_COMMUNITY): Payer: Medicare Other

## 2019-10-17 ENCOUNTER — Ambulatory Visit: Payer: Medicare Other

## 2019-10-17 DIAGNOSIS — D649 Anemia, unspecified: Secondary | ICD-10-CM

## 2019-10-17 DIAGNOSIS — S82401A Unspecified fracture of shaft of right fibula, initial encounter for closed fracture: Secondary | ICD-10-CM

## 2019-10-17 MED ORDER — SODIUM CHLORIDE 0.9 % IV SOLN
3.0000 g | Freq: Two times a day (BID) | INTRAVENOUS | Status: AC
  Administered 2019-10-17 – 2019-10-21 (×9): 3 g via INTRAVENOUS
  Filled 2019-10-17 (×9): qty 3

## 2019-10-17 MED ORDER — GUAIFENESIN ER 600 MG PO TB12
600.0000 mg | ORAL_TABLET | Freq: Two times a day (BID) | ORAL | Status: DC
  Administered 2019-10-17 (×2): 600 mg via ORAL
  Filled 2019-10-17 (×3): qty 1

## 2019-10-17 MED ORDER — GABAPENTIN 100 MG PO CAPS
100.0000 mg | ORAL_CAPSULE | Freq: Two times a day (BID) | ORAL | Status: DC
  Administered 2019-10-17 – 2019-10-18 (×2): 100 mg via ORAL
  Filled 2019-10-17 (×2): qty 1

## 2019-10-17 MED ORDER — MIRTAZAPINE 15 MG PO TBDP
15.0000 mg | ORAL_TABLET | Freq: Once | ORAL | Status: AC
  Administered 2019-10-17: 15 mg via ORAL
  Filled 2019-10-17: qty 1

## 2019-10-17 MED ORDER — PANTOPRAZOLE SODIUM 40 MG IV SOLR: 40.0000 mg | INTRAVENOUS | Status: DC

## 2019-10-17 MED ORDER — SODIUM CHLORIDE 0.9 % IV SOLN
3.0000 g | Freq: Once | INTRAVENOUS | Status: AC
  Administered 2019-10-17: 3 g via INTRAVENOUS
  Filled 2019-10-17: qty 8

## 2019-10-17 MED ORDER — ONDANSETRON HCL 4 MG/2ML IJ SOLN: 4.0000 mg | Freq: Four times a day (QID) | INTRAMUSCULAR | Status: DC | PRN

## 2019-10-17 MED ORDER — PANTOPRAZOLE SODIUM 40 MG IV SOLR
40.0000 mg | INTRAVENOUS | Status: DC
  Administered 2019-10-17 – 2019-10-23 (×6): 40 mg via INTRAVENOUS
  Filled 2019-10-17 (×5): qty 40

## 2019-10-17 NOTE — Progress Notes (Signed)
[facility] Progress Note  Patient: Jordan [name]    QMV:[id number]  DOA: 10/16/2019     Date of Service: the patient was seen and examined on 10/17/2019  Brief hospital course: Patient presented with complaints of shortness of breath and fatigue after recent presentation in ER with aspiration.  Found to have aspiration pneumonia Currently plan is continue further work-up for aspiration as well as treat pneumonia.  Assessment and Plan: 1.  Acute hypoxic respiratory failure. Aspiration pneumonia. Esophageal dysfunction history. Follow-up on cultures. Continue with IV Unasyn. Patient still mildly hypoxic. Mucinex. N.p.o. until speech evaluation as well as esophagogram. Gentle IV hydration  2.  Intractable nausea and vomiting. Constipation. Continue stool softener.  Monitor.  3.  Recent nondisplaced fracture and right distal fibula Currently has cam boot with ambulation. Outpatient follow-up with Ortho.  4.  Bicytopenia with chronic anemia and thrombocytopenia Monitor for now. No active bleeding.  5.  Paroxysmal A. fib Rate controlled without any medication. Continue Eliquis for now. Again monitor in the setting of bicytopenia.  6.  Hypothyroidism Continue Synthroid. Check TSH and free T4.  Diet: N.p.o., medication in applesauce. DVT Prophylaxis: Therapeutic Anticoagulation with Eliquis   Advance goals of care discussion: Full code  Family Communication: family was present at bedside, at the time of interview.  The pt provided permission to discuss medical plan with the family. Opportunity was given to ask question and all questions were answered satisfactorily.   Disposition:  Status is: Inpatient  Remains inpatient appropriate because:Ongoing diagnostic testing needed not appropriate for outpatient work up and IV treatments appropriate due to intensity of illness or inability to take PO   Dispo: The patient is from: Home              Anticipated d/c is to:  Home              Anticipated d/c date is: 2 days              Patient currently is not medically stable to d/c.        Subjective: Episode of aspiration this morning after trying to eat dysphagia 1 diet. Reported nausea as well as one episode of vomiting. Currently reports something choking in her throat. No fever no chills right now but had some fever at home.  No diarrhea.  Physical Exam:  General: Appear in mild distress, no Rash; Oral Mucosa Clear, moist. no Abnormal Neck Mass Or lumps, Conjunctiva normal  Cardiovascular: S1 and S2 Present, no Murmur, Respiratory: increased respiratory effort, Bilateral Air entry present and bilateral  Crackles, no wheezes Abdomen: Bowel Sound present, Soft and no tenderness Extremities: no Pedal edema, no calf tenderness, right leg in a cam boot. Neurology: alert and oriented to time, place, and person affect appropriate. no new focal deficit Gait not checked due to patient safety concerns  Vitals:   10/17/19 1300 10/17/19 1330 10/17/19 1443 10/17/19 1601  BP: (!) 151/49 (!) 124/50 (!) 141/53 (!) 147/58  Pulse: 66 67 64 64  Resp: 14 16 14 16   Temp:    (!) 97.3 F (36.3 C)  TempSrc:    Oral  SpO2: 100% 100% 100% 100%  Weight:      Height:        Intake/Output Summary (Last 24 hours) at 10/17/2019 1923 Last data filed at 10/17/2019 1702 Gross per 24 hour  Intake 100 ml  Output 300 ml  Net -200 ml   Filed Weights   10/17/19 0600  Weight: 47.2 kg    Data Reviewed: I have personally reviewed and interpreted daily labs, tele strips, imagings as discussed above. I reviewed all nursing notes, pharmacy notes, vitals, pertinent old records I have discussed plan of care as described above with RN and patient/family.  CBC: Recent Labs  Lab 10/16/19 1800  WBC 8.7  NEUTROABS 7.4  HGB 9.9*  HCT 30.0*  MCV 101.4*  PLT 97*   Basic Metabolic Panel: Recent Labs  Lab 10/16/19 1800  NA 131*  K 4.3  CL 94*  CO2 28  GLUCOSE  102*  BUN 25*  CREATININE 1.05*  CALCIUM 8.2*    Studies: DG CHEST PORT 1 VIEW  Result Date: 10/17/2019 CLINICAL DATA:  Choking EXAM: PORTABLE CHEST 1 VIEW COMPARISON:  Chest radiograph from one day prior. FINDINGS: Right total shoulder arthroplasty. Stable configuration of 2 lead left subclavian pacemaker. Stable cardiomediastinal silhouette with mild cardiomegaly. No pneumothorax. No pleural effusion. Patchy medial right lung base opacity with suggestion of associated bronchiectasis, not appreciably changed. No overt pulmonary edema. IMPRESSION: 1. No appreciable change in patchy medial right lung base opacity with suggestion of underlying bronchiectasis, which could represent atelectasis, aspiration or pneumonia. 2. Stable mild cardiomegaly without overt pulmonary edema. Electronically Signed   By: Ilona Sorrel M.D.   On: 10/17/2019 11:19   DG Abd Portable 1V  Result Date: 10/17/2019 CLINICAL DATA:  Choking, chronic constipation EXAM: PORTABLE ABDOMEN - 1 VIEW COMPARISON:  None. FINDINGS: No disproportionately dilated small bowel loops. Mild colonic stool. No evidence of pneumatosis or pneumoperitoneum. No radiopaque nephrolithiasis. Marked lumbar spondylosis. IMPRESSION: Nonobstructive bowel gas pattern. Mild colonic stool. Electronically Signed   By: Ilona Sorrel M.D.   On: 10/17/2019 11:20    Scheduled Meds: . apixaban  2.5 mg Oral BID  . fesoterodine  4 mg Oral Daily  . gabapentin  100 mg Oral BID  . guaiFENesin  600 mg Oral BID  . levothyroxine  100 mcg Oral Q0600  . pantoprazole (PROTONIX) IV  40 mg Intravenous Q24H  . traZODone  50 mg Oral QHS   Continuous Infusions: . ampicillin-sulbactam (UNASYN) IV 3 g (10/17/19 1702)   PRN Meds: acetaminophen, docusate sodium, ondansetron (ZOFRAN) IV, traMADol  Time spent: 35 minutes  Author: Berle Mull, MD [facility] 10/17/2019 7:23 PM  To reach On-call, see care teams to locate the attending and reach out via  [email or web address]. Between 7PM-7AM, please contact night-coverage If you still have difficulty reaching the attending provider, please page the [place] (Director on Call) for [facility] on amion for assistance.

## 2019-10-17 NOTE — ED Notes (Signed)
Spoke to daughter and Pt at length regarding plan of care.  Daughter concerned about diet and Inpatient visitation.  Informed her the IP nurses would explain everything once they are upstairs.   Coffee provided to daughter.

## 2019-10-17 NOTE — Evaluation (Signed)
SLP Cancellation Note  Patient Details Name: Jordan [name] MRN: [id number] DOB: 1919/12/05   Cancelled treatment:       Reason Eval/Treat Not Completed: Other (comment) (SLP swallow eval ordered in addition to barium swallow, SLP will follow up after esophagram.  Thanks for the consult)  Kathleen Lime, MS [facility] SLP Acute Rehab Services Office [phone]  Macario Golds 10/17/2019, 6:01 PM

## 2019-10-17 NOTE — Progress Notes (Signed)
Patient and daughter Stasia Cavalier refused the need for low bed. Educated on the indication and purpose. Daughter stated that patient did not fall by banged her foot on chair. Agreeable to floor mats. Mats placed. Daughter to stay with patient overnight. Bed low locked side rails upx3. Call bell within reach. Hourly rounding.

## 2019-10-17 NOTE — Progress Notes (Signed)
Pharmacy Antibiotic Note  Jordan [name] is a 86 y.o. female admitted on 10/16/2019 with pneumonia.  Pharmacy has been consulted for unasyn dosing.  Plan: Unasyn 3gm iv q12hr  Height: 4' 4.99" (134.6 cm) Weight: 47.2 kg (104 lb 0.9 oz) IBW/kg (Calculated) : 29.38  Temp (24hrs), Avg:99.7 F (37.6 C), Min:98.1 F (36.7 C), Max:101.4 F (38.6 C)  Recent Labs  Lab 10/16/19 1800  WBC 8.7  CREATININE 1.05*  LATICACIDVEN 0.9    Estimated Creatinine Clearance: 17.2 mL/min (A) (by C-G formula based on SCr of 1.05 mg/dL (H)).    Allergies  Allergen Reactions  . Chlorthalidone Other (See Comments)    Hyponatremia  . Honey Bee Treatment [Bee Venom] Swelling and Rash  . Verapamil Other (See Comments)    Bradycardia - HR  35-45 on 40mg  TID  . Adhesive [Tape] Rash and Other (See Comments)    Skin irritation  . Amlodipine Other (See Comments)    Peripheral edema with 2.5mg  daily dose    Antimicrobials this admission: Unasyn 10/16/2019 >>  Dose adjustments this admission: -  Microbiology results: -  Thank you for allowing pharmacy to be a part of this patient's care.  Nani Skillern Crowford 10/17/2019 6:45 AM

## 2019-10-17 NOTE — Telephone Encounter (Signed)
Reviewed

## 2019-10-17 NOTE — Chronic Care Management (AMB) (Signed)
   RN Case Manager Care Management Admission Note 10/17/2019   RNCM notified that Jordan [name]  MRN: [id number] DOB: [birth year] was admitted to the hospital for aspiration pneumonia on 10/17/19  Plan:  RNCM will monitor the patients progress  [place], BSN, [place] Phone: [phone] Fax: [phone]

## 2019-10-17 NOTE — ED Notes (Signed)
Pt provide water w/o ice per daughter's request.

## 2019-10-17 NOTE — ED Notes (Signed)
Assisted to BR via w/c. This RN and daughter stayed with pt who voided 352ml clear urine.

## 2019-10-18 ENCOUNTER — Telehealth: Payer: Self-pay

## 2019-10-18 ENCOUNTER — Inpatient Hospital Stay (HOSPITAL_COMMUNITY): Payer: Medicare Other

## 2019-10-18 DIAGNOSIS — I5032 Chronic diastolic (congestive) heart failure: Secondary | ICD-10-CM

## 2019-10-18 LAB — BASIC METABOLIC PANEL
Anion gap: 6 (ref 5–15)
BUN: 28 mg/dL — ABNORMAL HIGH (ref 8–23)
CO2: 27 mmol/L (ref 22–32)
Calcium: 8.1 mg/dL — ABNORMAL LOW (ref 8.9–10.3)
Chloride: 100 mmol/L (ref 98–111)
Creatinine, Ser: 1.23 mg/dL — ABNORMAL HIGH (ref 0.44–1.00)
GFR calc Af Amer: 42 mL/min — ABNORMAL LOW (ref >60)
GFR calc non Af Amer: 36 mL/min — ABNORMAL LOW (ref >60)
Glucose, Bld: 67 mg/dL — ABNORMAL LOW (ref 70–99)
Potassium: 4.1 mmol/L (ref 3.5–5.1)
Sodium: 133 mmol/L — ABNORMAL LOW (ref 135–145)

## 2019-10-18 LAB — MAGNESIUM: Magnesium: 1.9 mg/dL (ref 1.7–2.4)

## 2019-10-18 LAB — ECHOCARDIOGRAM COMPLETE
Height: 52.992 in
Weight: 1664.91 oz

## 2019-10-18 LAB — CBC
HCT: 26.9 % — ABNORMAL LOW (ref 36.0–46.0)
Hemoglobin: 8.9 g/dL — ABNORMAL LOW (ref 12.0–15.0)
MCH: 33.7 pg (ref 26.0–34.0)
MCHC: 33.1 g/dL (ref 30.0–36.0)
MCV: 101.9 fL — ABNORMAL HIGH (ref 80.0–100.0)
Platelets: 91 10*3/uL — ABNORMAL LOW (ref 150–400)
RBC: 2.64 MIL/uL — ABNORMAL LOW (ref 3.87–5.11)
RDW: 16.4 % — ABNORMAL HIGH (ref 11.5–15.5)
WBC: 5.9 10*3/uL (ref 4.0–10.5)
nRBC: 0 % (ref 0.0–0.2)

## 2019-10-18 MED ORDER — DEXTROSE-NACL 5-0.45 % IV SOLN: INTRAVENOUS | Status: DC

## 2019-10-18 MED ORDER — ACETAMINOPHEN 650 MG RE SUPP
650.0000 mg | RECTAL | Status: DC | PRN
  Administered 2019-10-20: 650 mg via RECTAL
  Filled 2019-10-18: qty 1

## 2019-10-18 MED ORDER — ENOXAPARIN SODIUM 30 MG/0.3ML ~~LOC~~ SOLN: 30.0000 mg | SUBCUTANEOUS | Status: DC

## 2019-10-18 MED ORDER — ENOXAPARIN SODIUM 40 MG/0.4ML ~~LOC~~ SOLN: 40.0000 mg | SUBCUTANEOUS | Status: DC

## 2019-10-18 MED ORDER — GABAPENTIN 300 MG PO CAPS
300.0000 mg | ORAL_CAPSULE | Freq: Every day | ORAL | Status: DC
  Filled 2019-10-18: qty 1

## 2019-10-18 MED ORDER — ENOXAPARIN SODIUM 30 MG/0.3ML ~~LOC~~ SOLN
30.0000 mg | SUBCUTANEOUS | Status: DC
  Administered 2019-10-19 – 2019-10-22 (×4): 30 mg via SUBCUTANEOUS
  Filled 2019-10-18 (×5): qty 0.3

## 2019-10-18 NOTE — Care Management Important Message (Signed)
Important Message  Patient Details IM Letter given to Roque Lias SW Case Manager to present to the [name] MRN: [id number] Date of Birth: Mar 28, 1920   Medicare Important Message Given:  Yes     Kerin Salen 10/18/2019, 10:10 AM

## 2019-10-18 NOTE — Progress Notes (Addendum)
[facility] Progress Note  Patient: Jordan [name]    ZYS:[id number]  DOA: 10/16/2019     Date of Service: the patient was seen and examined on 10/18/2019  Brief hospital course: Patient presented with complaints of shortness of breath and fatigue after recent presentation in ER with aspiration.  Found to have aspiration pneumonia Currently plan is continue further work-up for aspiration as well as treat pneumonia.  Assessment and Plan: 1.  Acute hypoxic respiratory failure. Aspiration pneumonia. Esophageal dysfunction history. Follow-up on cultures.  So far no growth for 48 hours. Oxygenation improving as well. No leukocytosis.  No fever. Continue with IV Unasyn. We will consider gentle IV hydration depending on GI recommendation for diet likely tomorrow.  2.  Intractable nausea and vomiting. Constipation. Continue stool softener.  Monitor. Currently no nausea or vomiting reported by the patient.  3.  Esophageal dysmotility as well as stricture. We will follow diet Recommendation per GI. [facility] gastroenterology consultation. Currently recommending EGD and recommended holding Eliquis. Also EGD currently being on hold due to concern with pneumonia. Anticipating the patient will be able to tolerate EGD on Sunday.  Recent nondisplaced fracture and right distal fibula Currently has cam boot with ambulation. Outpatient follow-up with Ortho.  4.  Bicytopenia with chronic anemia and thrombocytopenia We will perform blood work tomorrow to ensure no nutritional deficiency is causing this bicytopenia. No evidence of active bleeding for now. Drop in the hemoglobin acutely is likely from dilution.  5.  Paroxysmal A. fib Rate controlled without any medication. Given that patient will require a dilatation, currently holding Eliquis. Again monitor in the setting of bicytopenia.  6.  Hypothyroidism Continue Synthroid. Check TSH and free T4.  Ordered for 6 9421.  7.   Elevated BNP. BNP significant elevated from baseline. Echo performed shows EF of 60 to 93%, diastolic dysfunction no significant valvular abnormality. Currently holding off on IV fluids due to concern with volume overload. Takes 20 mg Lasix as needed at home.  8.  Chronic kidney disease stage IIIa. Serum creatinine fluctuated between 1.05 and 1.23. GFR less than 50. Monitor closely over the proximal medication.  Due to elevated BNP concern holding off on IV fluids but also holding off on a diuretic.  9.  History of gout. Given poor renal function holding colchicine with supposedly patient is taking 0.6 mg twice daily.  also holding off on allopurinol for now to minimize medications with dysphagia.  10.  Failure to thrive. Depression Patient is on Remeron and trazodone Currently on hold to minimize medications. Monitor.  Diet: N.p.o., medication in applesauce. DVT Prophylaxis: SCD.  Eliquis on hold.  Advance goals of care discussion: DNR/DNI and based on my discussion with patient's daughter who is the POA as well as son at bedside.  Spent extensive time with son at bedside as well as on phone with daughter. Explained that the patient has failure to thrive with poor reserve and will not survive cardiac arrest given her comorbidities.  Explained that she will remain at high risk for aspiration down the road given her weakness and tiredness related with recurrent admission.  Family Communication: family was present at bedside, at the time of interview.  The pt provided permission to discuss medical plan with the family. Opportunity was given to ask question and all questions were answered satisfactorily.   Disposition:  Status is: Inpatient  Remains inpatient appropriate because:Ongoing diagnostic testing needed not appropriate for outpatient work up and IV treatments appropriate due to intensity  of illness or inability to take PO   Dispo: The patient is from: Home               Anticipated d/c is to: Home              Anticipated d/c date is: 2 days              Patient currently is not medically stable to d/c.  Subjective: No nausea no vomiting no fever no chills.  No chest pain no abdominal pain.  Physical Exam:  General: Appear in mild distress, no Rash; Oral Mucosa Clear, moist. no Abnormal Neck Mass Or lumps, Conjunctiva normal  Cardiovascular: S1 and S2 Present, aortic systolic  Murmur, Respiratory: good respiratory effort, Bilateral Air entry present and bilateral  Crackles, no wheezes Abdomen: Bowel Sound present, Soft and no tenderness Extremities: no Pedal edema, no calf tenderness Neurology: alert and oriented to time, place, and person affect appropriate. no new focal deficit Gait not checked due to patient safety concerns  Vitals:   10/18/19 0350 10/18/19 0940 10/18/19 0943 10/18/19 1438  BP: (!) 147/57 (!) 138/49 (!) 143/55 (!) 143/49  Pulse: 81 80 77 83  Resp: 15 17  18   Temp: 98.4 F (36.9 C) (!) 97.5 F (36.4 C)  (!) 97.4 F (36.3 C)  TempSrc:  Oral  Oral  SpO2: 97% 100%  99%  Weight:      Height:        Intake/Output Summary (Last 24 hours) at 10/18/2019 1658 Last data filed at 10/18/2019 6606 Gross per 24 hour  Intake 100 ml  Output --  Net 100 ml   Filed Weights   10/17/19 0600  Weight: 47.2 kg    Data Reviewed: I have personally reviewed and interpreted daily labs, tele strips, imagings as discussed above. I reviewed all nursing notes, pharmacy notes, vitals, pertinent old records I have discussed plan of care as described above with RN and patient/family.  CBC: Recent Labs  Lab 10/16/19 1800 10/18/19 0356  WBC 8.7 5.9  NEUTROABS 7.4  --   HGB 9.9* 8.9*  HCT 30.0* 26.9*  MCV 101.4* 101.9*  PLT 97* 91*   Basic Metabolic Panel: Recent Labs  Lab 10/16/19 1800 10/18/19 0356  NA 131* 133*  K 4.3 4.1  CL 94* 100  CO2 28 27  GLUCOSE 102* 67*  BUN 25* 28*  CREATININE 1.05* 1.23*  CALCIUM 8.2* 8.1*  MG  --   1.9    Studies: ECHOCARDIOGRAM COMPLETE  Result Date: 10/18/2019    ECHOCARDIOGRAM REPORT   Patient Name:   SAHIRAH RUDELL Simic Date of Exam: 10/18/2019 Medical Rec #:  [id number]  Height:       53.0 in Accession #:    [id number] Weight:       104.1 lb Date of Birth:  Apr 25, 1920  BSA:          1.292 m Patient Age:    86 years   BP:           138/49 mmHg Patient Gender: F          HR:           77 bpm. Exam Location:  Inpatient Procedure: 2D Echo Indications:    CHF 428  History:        Patient has prior history of Echocardiogram examinations, most                 recent 07/29/2014. CHF, Pacemaker; Risk  Factors:Hypertension and                 Dyslipidemia. Heart block. loop recorder implant.  Sonographer:    Jannett Celestine RDCS (AE) Referring Phys: [id number] [place]  1. Left ventricular ejection fraction, by estimation, is 60 to 65%. The left ventricle has normal function. The left ventricle has no regional wall motion abnormalities. Indeterminate diastolic filling due to E-A fusion.  2. Right ventricular systolic function is normal. The right ventricular size is normal.  3. The mitral valve is normal in structure. No evidence of mitral valve regurgitation. No evidence of mitral stenosis.  4. The aortic valve was not well visualized. Aortic valve regurgitation is not visualized. No aortic stenosis is present. Comparison(s): Prior images unable to be directly viewed, comparison made by report only. FINDINGS  Left Ventricle: Left ventricular ejection fraction, by estimation, is 60 to 65%. The left ventricle has normal function. The left ventricle has no regional wall motion abnormalities. The left ventricular internal cavity size was normal in size. There is  no left ventricular hypertrophy. Indeterminate diastolic filling due to E-A fusion. Right Ventricle: The right ventricular size is normal. No increase in right ventricular wall thickness. Right ventricular systolic function is normal. Left Atrium: Left  atrial size was normal in size. Right Atrium: Right atrial size was normal in size. Pericardium: There is no evidence of pericardial effusion. Mitral Valve: The mitral valve is normal in structure. Mild mitral annular calcification. No evidence of mitral valve regurgitation. No evidence of mitral valve stenosis. Tricuspid Valve: The tricuspid valve is normal in structure. Tricuspid valve regurgitation is not demonstrated. Aortic Valve: The aortic valve was not well visualized. Aortic valve regurgitation is not visualized. No aortic stenosis is present. Pulmonic Valve: The pulmonic valve was not well visualized. Pulmonic valve regurgitation is not visualized. Aorta: The aortic root was not well visualized. IAS/Shunts: No atrial level shunt detected by color flow Doppler. Additional Comments: A pacer wire is visualized.  LEFT VENTRICLE PLAX 2D LVIDd:         4.50 cm LVIDs:         3.10 cm LV PW:         1.10 cm LV IVS:        1.20 cm  LEFT ATRIUM           Index LA diam:      3.40 cm 2.63 cm/m LA Vol (A2C): 40.0 ml 30.95 ml/m  AORTIC VALVE LVOT Vmax:   129.00 cm/s LVOT Vmean:  91.300 cm/s LVOT VTI:    0.263 m  AORTA Ao Root diam: 2.90 cm  SHUNTS Systemic VTI: 0.26 m Dani Gobble Croitoru MD Electronically signed by Sanda Klein MD Signature Date/Time: 10/18/2019/3:11:54 PM    Final    DG ESOPHAGUS W SINGLE CM (SOL OR THIN BA)  Result Date: 10/18/2019 CLINICAL DATA:  Odynophagia and dysphagia.  Recent aspiration. EXAM: ESOPHOGRAM/BARIUM SWALLOW TECHNIQUE: Single contrast examination was performed using  thin barium. FLUOROSCOPY TIME:  Fluoroscopy Time:  1 minutes 54 seconds Radiation Exposure Index (if provided by the fluoroscopic device): 16.6 mGy Number of Acquired Spot Images: 0 COMPARISON:  None. FINDINGS: Single contrast esophagram was performed with patient in semi erect position due to her immobility and inability to stand because of a broken leg. A moderate smoothly tapered stricture is seen at the  gastroesophageal junction. No other soft esophageal strictures are seen. Severe esophageal dysmotility is seen with break up of primary peristalsis in the  proximal stomach and intermittent tertiary contractions causing esophageal stasis. No gastroesophageal reflux was seen during the exam. No evidence of hiatal hernia. IMPRESSION: Severe esophageal dysmotility. Moderate smoothly-tapered stricture at the gastroesophageal junction. No evidence of hiatal hernia.  No gastroesophageal reflux seen. Electronically Signed   By: Marlaine Hind M.D.   On: 10/18/2019 10:26    Scheduled Meds: . [START ON 10/19/2019] enoxaparin (LOVENOX) injection  40 mg Subcutaneous Q24H  . gabapentin  300 mg Oral QHS  . levothyroxine  100 mcg Oral Q0600  . pantoprazole (PROTONIX) IV  40 mg Intravenous Q24H   Continuous Infusions: . ampicillin-sulbactam (UNASYN) IV 3 g (10/18/19 0625)   PRN Meds: acetaminophen, ondansetron (ZOFRAN) IV  Time spent: 35 minutes  Author: Berle Mull, MD [facility] 10/18/2019 4:58 PM  To reach On-call, see care teams to locate the attending and reach out via [email or web address]. Between 7PM-7AM, please contact night-coverage If you still have difficulty reaching the attending provider, please page the [facility] (Director on Call) for [facility] on amion for assistance.

## 2019-10-18 NOTE — Consult Note (Signed)
Subjective:   HPI  The patient is a 86 year old female who was admitted to the hospital with shortness of breath and found to have aspiration pneumonia which she is being treated for.  As part of the evaluation she had a barium swallow done which showed esophageal dysmotility and also a distal esophageal stricture.  Looking back at her records she had an EGD with foreign body removal from the esophagus in May 2017.  She had a barium swallow in July 2017 showing narrowing of the distal esophagus and another barium swallow in 2019 showing narrowing of the distal esophagus.  She has had some intermittent problems swallowing solids and her son states that it has been worse recently.  She has been taking Eliquis.  The patient speaks Spanish her son is translating for her.  He speaks good [place].     Past Medical History:  Diagnosis Date  . Acute hip pain, left 06/29/2018  . Acute pancreatitis 04/05/2018  . AKI (acute kidney injury) ([place])   . Alzheimer's dementia [facility]) 07/30/2013   04/17/14 MoCA - Blind (Spanish version); 9 out of 22.  Correct MoCA = 12 out of 30.    . Arthritis   . Atherosclerosis of aorta ([place]) 10/05/2015  . Back pain 10/05/2015  . Bronchiectasis ([place]) 10/03/2017  . Calcification of native coronary artery 10/05/2015   Chest CT 2011.   . Carpal tunnel syndrome 06/29/2006   Qualifier: Diagnosis of  By: Benna Dunks    . Cervicalgia 05/18/2009   Qualifier: Diagnosis of  By: Walker Kehr MD, Patrick Jupiter    . CHF (congestive heart failure) ([place])   . Chronic bilateral thoracic back pain 10/05/2015  . Chronic constipation   . Chronic diastolic heart failure ([place]) 09/01/2013  . Chronic pain syndrome 05/09/2014  . CKD (chronic kidney disease) stage 3, GFR 30-59 ml/min 09/03/2013  . Combined visual hearing impairment 11/12/2013  . Complete heart block ([place])   . Constipation   . Cryptogenic stroke ([place]) S/P  LOOP RECORDER IMPLANT   DX  01/2013  --  SYNCOPAL EPISODE --  EPISODIC  ATRIAL TACHY/ FIBULATION  AND  PAUSES  . CYSTOCELE/RECTOCELE/PROLAPSE,UNSPEC. 06/29/2006   Qualifier: Diagnosis of  By: Benna Dunks    . Depressive disorder 06/29/2006       . Dilation of esophagus 06/16/2017   Barium Esophagram 06/25/17: Poor esophageal motility. Dilated esophagus with tertiary contractions.  Smooth narrowing distal esophagus similar to the prior study.  Moderate narrowing. Barium tablet passed through this area after several sips of water.  Marland Kitchen Displaced fracture of fifth metatarsal bone, right foot, subsequent encounter for fracture with routine healing 06/05/2017  . Diverticulosis of colon   . DIVERTICULOSIS OF COLON 06/29/2006   Qualifier: Diagnosis of  By: Benna Dunks    . Dry eyes   . Dysphagia 09/15/2015  . Encounter for care of pacemaker   . Fall from bed 03/17/2016  . Fatigue   . Food impaction of esophagus   . Frequency of urination   . Gait instability   . Generalized osteoarthritis of multiple sites 06/15/2017  . Glaucoma 04/18/2014  . Glaucoma of both eyes   . Gout 09/21/2013  . Hand Heaviness 03/17/2016  . Headache   . Hearing loss of aging 05/09/2014  . HERNIA, INCISIONAL VENTRAL W/O OBST/GNGR 10/25/2006   Has been seen by surgery in the past and told that this is not dangerous and should only be treated particularly bothersome    . History of acute gouty arthritis 09/21/2013  .  History of colon polyps   . History of Food impaction of esophagus   . HTN (hypertension) 06/29/2006   Isolated Systolic Hypertension - Ambulatory BP  Monitoring result 4/28-29/2015   . Hyperlipidemia 02/27/2015  . Hyperplastic colon polyp 2006   colonoscope  . Hypertension   . Hypochloremia   . Hypoproteinemia ([place]) 09/14/2018  . Hypothyroidism   . Impaired mobility and ADLs 05/30/2014  . Influenza A 05/14/2016  . Insomnia   . INTERSTITIAL CYSTITIS 09/21/2009   Qualifier: Diagnosis of  By: Sarita Haver  MD, Coralyn Mark    . Loss of weight 04/05/2010   Qualifier: Diagnosis of  By: Walker Kehr MD, Patrick Jupiter    . Lumbar stenosis   .  Macular degeneration, age related, nonexudative 04/18/2014  . Mass of soft tissue of upper arm 10/18/2018  . MASS, LUNG 04/05/2010   Qualifier: Diagnosis of  By: Walker Kehr MD, Patrick Jupiter    . Mobitz type 2 second degree heart block 08/29/2013  . Moderate dementia ([place])   . Narrowing of Distal Esophagus 06/16/2017   Barium Esophagram (06/15/17) Poor esophageal motility. Dilated esophagus with tertiary contractions.  Smooth narrowing distal esophagus similar to the prior study.  Moderate narrowing. Barium tablet passed through this area after several sips of water.   . OA (osteoarthritis)    RIGHT SHOULDER AC JOINT  . Orthostasis 04/28/2016  . OSTEOARTHRITIS, MULTI SITES 06/29/2006   Qualifier: Diagnosis of  By: Benna Dunks    . Osteoporosis   . OSTEOPOROSIS 03/20/2008   Qualifier: Diagnosis of  By: Jerline Pain MD, Anderson Malta    . 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 Peripheral neuropathy   . PERIPHERAL NEUROPATHY 03/29/2010   Qualifier: Diagnosis of  By: Walker Kehr MD, Patrick Jupiter    . Persistent headaches 09/06/2013  . Polymyalgia rheumatica ([place]) 11/08/2013  . Presbyesophagus (senile esophageal dysmotility) 06/16/2017  . Presence of permanent cardiac pacemaker   . Rib pain on left side 10/05/2015  . Right knee pain 01/15/2014  . Right rotator cuff tear   . Rotator cuff tear arthropathy 09/11/2012   Likely related to rotator cuff tendinopathy seen on musculoskeletal ultrasound, and GH DJD.  Complete tear of right supra and infraspinatus on MRI 10/2012     . S/P shoulder replacement 03/20/2015  . Shortness of breath dyspnea   . Spinal stenosis of lumbar region 10/03/2012   Patient has significant low back pain related to spinal stenosis and DJD, DDD.  We had a long discussion about treatment options which are quite limited. She is likely not a good surgical candidate for laminectomy. I am not sure about the usefulness of epidural  steroid injection. We'll plan on restarting meloxicam after a long discussion of the pros and cons and risks of chronic kidney disease.  The patient and her family feel that the benefit of good pain control is the small risk.  Additionally she will followup with her primary care provider for creatinine monitoring.  Will obtain physical therapy for TENS unit trial.  Will refer to pain management for consideration of epidural steroid injection as that worked well in the past for cervical pain Followup here as needed   . Syncope and collapse 02/09/2013  . Urinary frequency 06/05/2015  . Urinary incontinence, mixed 06/29/2006   Qualifier: Diagnosis of  By: Benna Dunks    . UTI (urinary tract infection) 05/27/2016  . UTI (urinary tract infection) 05/2017  . Vertigo 10/18/2010  .  Weakness 08/29/2013   Past Surgical History:  Procedure Laterality Date  . CARPAL TUNNEL RELEASE Right   . ESOPHAGOGASTRODUODENOSCOPY N/A 09/15/2015   Procedure: ESOPHAGOGASTRODUODENOSCOPY (EGD);  Surgeon: Clarene Essex, MD;  Location: [facility] ENDOSCOPY;  Service: Endoscopy;  Laterality: N/A;  . EYE SURGERY     cataract, bilat.  Randolm Idol / REPLACE / REMOVE PACEMAKER    . LOOP RECORDER IMPLANT  02-08-13; 08-30-13   MDT LinQ implanted by Dr Lovena Le for syncope, explanted 08-30-13 after CHB identified  . LOOP RECORDER IMPLANT N/A 02/11/2013   Procedure: LOOP RECORDER IMPLANT;  Surgeon: Evans Lance, MD;  Location: [facility] CATH LAB;  Service: Cardiovascular;  Laterality: N/A;  . PACEMAKER INSERTION  08-30-2013   MDT ADDRL1 pacemaker implanted by Dr Rayann Heman for CHB  . PERMANENT PACEMAKER INSERTION N/A 08/30/2013   Procedure: PERMANENT PACEMAKER INSERTION;  Surgeon: Coralyn Mark, MD;  Location: [place] CATH LAB;  Service: Cardiovascular;  Laterality: N/A;  . REVERSE SHOULDER ARTHROPLASTY Right 03/20/2015   Procedure: RIGHT REVERSE SHOULDER ARTHROPLASTY;  Surgeon: Netta Cedars, MD;  Location: [place];  Service: Orthopedics;  Laterality: Right;  . TOTAL  ABDOMINAL HYSTERECTOMY W/ BILATERAL SALPINGOOPHORECTOMY  03-15-2005   Social History   Socioeconomic History  . Marital status: Widowed    Spouse name: Not on file  . Number of children: 3  . Years of education: Not on file  . Highest education level: Not on file  Occupational History  . Not on file  Tobacco Use  . Smoking status: Never Smoker  . Smokeless tobacco: Never Used  Vaping Use  . Vaping Use: Never used  Substance and Sexual Activity  . Alcohol use: No    Alcohol/week: 0.0 standard drinks  . Drug use: No  . Sexual activity: Never  Other Topics Concern  . Not on file  Social History Narrative   ** Merged History Encounter **       lives with son, [place]; No tob, etoh.  Has three total children. 1 g'child.;  Dtr [name] (w) [phone], (h) [phone]; dtr [place]. Daughters usually interpret for her at visits and are very involved and ask lots of questions.    Social Determinants of Health   Financial Resource Strain:   . Difficulty of Paying Living Expenses:   Food Insecurity:   . Worried About Charity fundraiser in the Last Year:   . Arboriculturist in the Last Year:   Transportation Needs:   . Film/video editor (Medical):   Marland Kitchen Lack of Transportation (Non-Medical):   Physical Activity:   . Days of Exercise per Week:   . Minutes of Exercise per Session:   Stress:   . Feeling of Stress :   Social Connections:   . Frequency of Communication with Friends and Family:   . Frequency of Social Gatherings with Friends and Family:   . Attends Religious Services:   . Active Member of Clubs or Organizations:   . Attends Archivist Meetings:   Marland Kitchen Marital Status:   Intimate Partner Violence:   . Fear of Current or Ex-Partner:   . Emotionally Abused:   Marland Kitchen Physically Abused:   . Sexually Abused:    family history includes Kidney disease in her mother.  Current Facility-Administered Medications:  .  acetaminophen (TYLENOL) suppository 650 mg, 650  mg, Rectal, Q4H PRN, Lavina Hamman, MD .  Ampicillin-Sulbactam (UNASYN) 3 g in sodium chloride 0.9 % 100 mL IVPB, 3 g, Intravenous, Q12H, Posey Pronto,  Josetta Huddle, MD, Last Rate: 200 mL/hr at 10/18/19 0625, 3 g at 10/18/19 0625 .  [START ON 10/19/2019] enoxaparin (LOVENOX) injection 40 mg, 40 mg, Subcutaneous, Q24H, Lavina Hamman, MD .  gabapentin (NEURONTIN) capsule 300 mg, 300 mg, Oral, QHS, Lavina Hamman, MD .  levothyroxine (SYNTHROID) tablet 100 mcg, 100 mcg, Oral, Q0600, Tu, Ching T, DO, 100 mcg at 10/18/19 8638 .  ondansetron (ZOFRAN) injection 4 mg, 4 mg, Intravenous, Q6H PRN, Lavina Hamman, MD .  pantoprazole (PROTONIX) injection 40 mg, 40 mg, Intravenous, Q24H, Lavina Hamman, MD, 40 mg at 10/17/19 2254 Allergies  Allergen Reactions  . Chlorthalidone Other (See Comments)    Hyponatremia  . Honey Bee Treatment [Bee Venom] Swelling and Rash  . Verapamil Other (See Comments)    Bradycardia - HR  35-45 on 40mg  TID  . Adhesive [Tape] Rash and Other (See Comments)    Skin irritation  . Amlodipine Other (See Comments)    Peripheral edema with 2.5mg  daily dose     Objective:     BP (!) 143/55 (BP Location: Right Arm)   Pulse 77   Temp (!) 97.5 F (36.4 C) (Oral)   Resp 17   Ht 4' 4.99" (1.346 m)   Wt 47.2 kg   SpO2 100%   BMI 26.05 kg/m   No distress  Heart regular  Lungs decreased breath sounds bilaterally with coarse sounds and crackles bilaterally  Abdomen soft and nontender  Laboratory No components found for: D1    Assessment:     Pneumonia  Esophageal stricture      Plan:     Once her pneumonia is better I think we can plan to do an EGD with distal esophageal dilatation of the stricture.  Since she is on Eliquis this will have to be held for a couple of days prior to that.  We will follow.

## 2019-10-18 NOTE — Evaluation (Signed)
SLP Cancellation Note  Patient Details Name: Jordan [name] MRN: [id number] DOB: 02/08/1920   Cancelled treatment:       Reason Eval/Treat Not Completed: Other (comment) (contact with MD via secure chat completed, MD advised SLP to hold off until GI sees pt and follow up for education)  Kathleen Lime, MS [place] Office [phone]  Macario Golds 10/18/2019, 3:41 PM

## 2019-10-18 NOTE — Telephone Encounter (Signed)
Patient daughter called and stated that patient is currently hospitalized after choking  and has now developed pneumonia. Patient recently had an echocardiogram done at the hospital and she wanted to know if you could give her the results, because no one has told her anything about it yet.   What she is mainly concerned about, is that the hospital has NOT yet given her Eliquis today. Please advise. Number for Ned Clines (daughter) [phone].

## 2019-10-19 ENCOUNTER — Inpatient Hospital Stay (HOSPITAL_COMMUNITY): Payer: Medicare Other

## 2019-10-19 LAB — BASIC METABOLIC PANEL
Anion gap: 9 (ref 5–15)
BUN: 33 mg/dL — ABNORMAL HIGH (ref 8–23)
CO2: 25 mmol/L (ref 22–32)
Calcium: 8.1 mg/dL — ABNORMAL LOW (ref 8.9–10.3)
Chloride: 99 mmol/L (ref 98–111)
Creatinine, Ser: 1.28 mg/dL — ABNORMAL HIGH (ref 0.44–1.00)
GFR calc Af Amer: 40 mL/min — ABNORMAL LOW (ref >60)
GFR calc non Af Amer: 35 mL/min — ABNORMAL LOW (ref >60)
Glucose, Bld: 60 mg/dL — ABNORMAL LOW (ref 70–99)
Potassium: 4.2 mmol/L (ref 3.5–5.1)
Sodium: 133 mmol/L — ABNORMAL LOW (ref 135–145)

## 2019-10-19 LAB — CBC WITH DIFFERENTIAL/PLATELET
Abs Immature Granulocytes: 0.01 10*3/uL (ref 0.00–0.07)
Basophils Absolute: 0 10*3/uL (ref 0.0–0.1)
Basophils Relative: 0 %
Eosinophils Absolute: 0 10*3/uL (ref 0.0–0.5)
Eosinophils Relative: 0 %
HCT: 29.5 % — ABNORMAL LOW (ref 36.0–46.0)
Hemoglobin: 9.6 g/dL — ABNORMAL LOW (ref 12.0–15.0)
Immature Granulocytes: 0 %
Lymphocytes Relative: 20 %
Lymphs Abs: 1 10*3/uL (ref 0.7–4.0)
MCH: 33.4 pg (ref 26.0–34.0)
MCHC: 32.5 g/dL (ref 30.0–36.0)
MCV: 102.8 fL — ABNORMAL HIGH (ref 80.0–100.0)
Monocytes Absolute: 0.5 10*3/uL (ref 0.1–1.0)
Monocytes Relative: 9 %
Neutro Abs: 3.5 10*3/uL (ref 1.7–7.7)
Neutrophils Relative %: 71 %
Platelets: 128 10*3/uL — ABNORMAL LOW (ref 150–400)
RBC: 2.87 MIL/uL — ABNORMAL LOW (ref 3.87–5.11)
RDW: 16.4 % — ABNORMAL HIGH (ref 11.5–15.5)
WBC: 5 10*3/uL (ref 4.0–10.5)
nRBC: 0 % (ref 0.0–0.2)

## 2019-10-19 LAB — CBC
HCT: 26.1 % — ABNORMAL LOW (ref 36.0–46.0)
Hemoglobin: 8.4 g/dL — ABNORMAL LOW (ref 12.0–15.0)
MCH: 33.1 pg (ref 26.0–34.0)
MCHC: 32.2 g/dL (ref 30.0–36.0)
MCV: 102.8 fL — ABNORMAL HIGH (ref 80.0–100.0)
Platelets: 106 10*3/uL — ABNORMAL LOW (ref 150–400)
RBC: 2.54 MIL/uL — ABNORMAL LOW (ref 3.87–5.11)
RDW: 16.3 % — ABNORMAL HIGH (ref 11.5–15.5)
WBC: 4.7 10*3/uL (ref 4.0–10.5)
nRBC: 0 % (ref 0.0–0.2)

## 2019-10-19 LAB — RETICULOCYTES
Immature Retic Fract: 15.8 % (ref 2.3–15.9)
RBC.: 2.85 MIL/uL — ABNORMAL LOW (ref 3.87–5.11)
Retic Count, Absolute: 80.9 10*3/uL (ref 19.0–186.0)
Retic Ct Pct: 2.8 % (ref 0.4–3.1)

## 2019-10-19 LAB — GLUCOSE, CAPILLARY
Glucose-Capillary: 43 mg/dL — CL (ref 70–99)
Glucose-Capillary: 127 mg/dL — ABNORMAL HIGH (ref 70–99)
Glucose-Capillary: 137 mg/dL — ABNORMAL HIGH (ref 70–99)
Glucose-Capillary: 163 mg/dL — ABNORMAL HIGH (ref 70–99)

## 2019-10-19 LAB — TYPE AND SCREEN
ABO/RH(D): O POS
Antibody Screen: NEGATIVE

## 2019-10-19 LAB — VITAMIN B12: Vitamin B-12: 1938 pg/mL — ABNORMAL HIGH (ref 180–914)

## 2019-10-19 LAB — ABO/RH: ABO/RH(D): O POS

## 2019-10-19 LAB — PROTIME-INR
INR: 1.2 (ref 0.8–1.2)
Prothrombin Time: 14.3 seconds (ref 11.4–15.2)

## 2019-10-19 LAB — TSH: TSH: 0.073 u[IU]/mL — ABNORMAL LOW (ref 0.350–4.500)

## 2019-10-19 LAB — T4, FREE: Free T4: 1.53 ng/dL — ABNORMAL HIGH (ref 0.61–1.12)

## 2019-10-19 MED ORDER — PRO-STAT SUGAR FREE PO LIQD
30.0000 mL | Freq: Two times a day (BID) | ORAL | Status: DC
  Administered 2019-10-19 – 2019-10-23 (×9): 30 mL via ORAL
  Filled 2019-10-19 (×9): qty 30

## 2019-10-19 MED ORDER — HYDRALAZINE HCL 20 MG/ML IJ SOLN
5.0000 mg | INTRAMUSCULAR | Status: DC | PRN
  Administered 2019-10-19 – 2019-10-21 (×4): 5 mg via INTRAVENOUS
  Filled 2019-10-19 (×4): qty 1

## 2019-10-19 MED ORDER — LACTULOSE 10 GM/15ML PO SOLN: 20.0000 g | Freq: Two times a day (BID) | ORAL | Status: DC

## 2019-10-19 MED ORDER — DEXTROSE IN LACTATED RINGERS 5 % IV SOLN: INTRAVENOUS | Status: DC

## 2019-10-19 MED ORDER — GABAPENTIN 100 MG PO CAPS
100.0000 mg | ORAL_CAPSULE | Freq: Two times a day (BID) | ORAL | Status: DC
  Administered 2019-10-19 – 2019-10-23 (×4): 100 mg via ORAL
  Filled 2019-10-19 (×6): qty 1

## 2019-10-19 MED ORDER — DEXTROSE 50 % IV SOLN
INTRAVENOUS | Status: AC
  Administered 2019-10-19: 50 mL
  Filled 2019-10-19: qty 50

## 2019-10-19 MED ORDER — FESOTERODINE FUMARATE ER 4 MG PO TB24
4.0000 mg | ORAL_TABLET | Freq: Every day | ORAL | Status: DC
  Administered 2019-10-19 – 2019-10-22 (×4): 4 mg via ORAL
  Filled 2019-10-19 (×5): qty 1

## 2019-10-19 MED ORDER — DEXTROSE-NACL 5-0.9 % IV SOLN: INTRAVENOUS | Status: DC

## 2019-10-19 NOTE — Progress Notes (Signed)
[facility] Progress Note  Patient: Jordan [name]    JYN:[id number]  DOA: 10/16/2019     Date of Service: the patient was seen and examined on 10/19/2019  Brief hospital course: Patient presents with complaints of shortness of breath and fatigue after presentation in the ER with aspiration event at home a few days ago.  Found to have aspiration pneumonia as well as esophageal dysmotility and stricture. Currently plan is follow-up on GI recommendation regarding timing of EGD.  Assessment and Plan: 1.  Acute hypoxic respiratory failure/resolved. Aspiration pneumonia. Esophageal dysfunction. Cultures so far negative for more than 48 hours. Oxygenation improving currently on room air. Still has bilateral crackles. No leukocytosis or fever. Some cough. Continue with IV Unasyn for now while the patient is remaining NPO. Gentle IV hydration for now.  2.  Esophageal dysmotility. Small esophageal stricture. Prior history of the same. [facility] gastroenterology consulted. Currently awaiting EGD for dilatation. Last Eliquis dose was noon 10/18/2019.  48 hours will be 10/20/2019 afternoon. Currently n.p.o. May be able to tolerate liquid diet.  3.  Intractable nausea and vomiting. Constipation. Today follow-up of constipation.  Patient reports not passing gas. X-ray abdomen on the day of admission was not significant. We will repeat x-ray abdomen given persistent obstipation. Monitor.  Currently no further nausea or vomiting.  4.  Recent nondisplaced fracture of right distal fibula Cam boot with ambulation. Outpatient follow-up with orthopedics.  Pain well controlled.  No swelling.  5.  Paroxysmal A. fib. Accelerated hypertension Heart rate currently controlled. Patient is on Eliquis currently on hold. Blood pressure mildly elevated.  Will initiate as needed hydralazine  6.  Bicytopenia. Chronic macrocytic anemia.  Acute worsening secondary to dilution. Chronic thrombocytopenia  currently stable. Likely in the setting of nutritional deficiency. Check Q65 folic acid.  7.  Hypothyroidism Continue Synthroid. Monitor TSH and free T4.  8.  Depression. Adult failure to thrive. Home dose of Remeron and trazodone currently on hold to minimize medication as well as to minimize confusion  9.  History of gout CrCl 14.1. Per pharmacy colchicine and allopurinol currently both on hold.  10.  Chronic renal disease stage IIIa. Mild worsening. GFR less than 50. CrCl 14. Gentle IV hydration.  Monitor.  11.  Elevated BNP. Chronic diastolic dysfunction. On Lasix as needed. Echocardiogram performed this admission shows preserved EF without any significant valvular or wall motion abnormality. Monitor while the patient is receiving IV fluids.  12.  Hypoglycemia. Started on D5. CBG every 4 hours.  13.  Language barrier. Patient needs Spanish interpreter although unable to use video interpreter successfully due to hearing difficulty. Family helps to interpret and prefers to use them as opposed to any interpreter.  Diet: N.p.o. for now. DVT Prophylaxis: SCD, pharmacological prophylaxis contraindicated due to Anemia and thrombocytopenia   Advance goals of care discussion: DNR  Family Communication: family was present at bedside, at the time of interview.  The pt provided permission to discuss medical plan with the family. Opportunity was given to ask question and all questions were answered satisfactorily.   Disposition:  Status is: Inpatient  Remains inpatient appropriate because:Ongoing diagnostic testing needed not appropriate for outpatient work up and IV treatments appropriate due to intensity of illness or inability to take PO   Dispo: The patient is from: Home              Anticipated d/c is to: SNF              Anticipated  d/c date is: > 3 days              Patient currently is not medically stable to d/c.  Subjective: Overnight daughter reported some  sneezing.  Also reported cough.  No nausea no vomiting.  Patient is now reporting that she is not passing any gas.  No BM.  No fever no chills.  Physical Exam:  General: Appear in mild distress, no Rash; Oral Mucosa Clear, moist. no Abnormal Neck Mass Or lumps, Conjunctiva normal  Cardiovascular: S1 and S2 Present, no Murmur, Respiratory: good respiratory effort, Bilateral Air entry present and bilateral  Crackles, no wheezes Abdomen: Bowel Sound absent, Soft and no tenderness Extremities: no Pedal edema, no calf tenderness Neurology: alert and oriented to place and person affect flat in affect. no new focal deficit Gait not checked due to patient safety concerns  Vitals:   10/18/19 0943 10/18/19 1438 10/18/19 2156 10/19/19 0526  BP: (!) 143/55 (!) 143/49 (!) 160/66 (!) 160/59  Pulse: 77 83 79 73  Resp:  18 16 16   Temp:  (!) 97.4 F (36.3 C) 97.9 F (36.6 C) 98.1 F (36.7 C)  TempSrc:  Oral    SpO2:  99% 96% 99%  Weight:      Height:       No intake or output data in the 24 hours ending 10/19/19 0842 Filed Weights   10/17/19 0600  Weight: 47.2 kg    Data Reviewed: I have personally reviewed and interpreted daily labs, tele strips, imagings as discussed above. I reviewed all nursing notes, pharmacy notes, vitals, pertinent old records I have discussed plan of care as described above with RN and patient/family.  CBC: Recent Labs  Lab 10/16/19 1800 10/18/19 0356 10/19/19 0350  WBC 8.7 5.9 4.7  NEUTROABS 7.4  --   --   HGB 9.9* 8.9* 8.4*  HCT 30.0* 26.9* 26.1*  MCV 101.4* 101.9* 102.8*  PLT 97* 91* 096*   Basic Metabolic Panel: Recent Labs  Lab 10/16/19 1800 10/18/19 0356 10/19/19 0350  NA 131* 133* 133*  K 4.3 4.1 4.2  CL 94* 100 99  CO2 28 27 25   GLUCOSE 102* 67* 60*  BUN 25* 28* 33*  CREATININE 1.05* 1.23* 1.28*  CALCIUM 8.2* 8.1* 8.1*  MG  --  1.9  --     Studies: ECHOCARDIOGRAM COMPLETE  Result Date: 10/18/2019    ECHOCARDIOGRAM REPORT   Patient  Name:   ALAINNA STAWICKI Dario Date of Exam: 10/18/2019 Medical Rec #:  [id number]  Height:       53.0 in Accession #:    [id number] Weight:       104.1 lb Date of Birth:  01/11/1920  BSA:          1.292 m Patient Age:    86 years   BP:           138/49 mmHg Patient Gender: F          HR:           77 bpm. Exam Location:  Inpatient Procedure: 2D Echo Indications:    CHF 428  History:        Patient has prior history of Echocardiogram examinations, most                 recent 07/29/2014. CHF, Pacemaker; Risk Factors:Hypertension and                 Dyslipidemia. Heart block. loop recorder implant.  Sonographer:  Jannett Celestine RDCS (AE) Referring Phys: [id number] [place]  1. Left ventricular ejection fraction, by estimation, is 60 to 65%. The left ventricle has normal function. The left ventricle has no regional wall motion abnormalities. Indeterminate diastolic filling due to E-A fusion.  2. Right ventricular systolic function is normal. The right ventricular size is normal.  3. The mitral valve is normal in structure. No evidence of mitral valve regurgitation. No evidence of mitral stenosis.  4. The aortic valve was not well visualized. Aortic valve regurgitation is not visualized. No aortic stenosis is present. Comparison(s): Prior images unable to be directly viewed, comparison made by report only. FINDINGS  Left Ventricle: Left ventricular ejection fraction, by estimation, is 60 to 65%. The left ventricle has normal function. The left ventricle has no regional wall motion abnormalities. The left ventricular internal cavity size was normal in size. There is  no left ventricular hypertrophy. Indeterminate diastolic filling due to E-A fusion. Right Ventricle: The right ventricular size is normal. No increase in right ventricular wall thickness. Right ventricular systolic function is normal. Left Atrium: Left atrial size was normal in size. Right Atrium: Right atrial size was normal in size. Pericardium: There is  no evidence of pericardial effusion. Mitral Valve: The mitral valve is normal in structure. Mild mitral annular calcification. No evidence of mitral valve regurgitation. No evidence of mitral valve stenosis. Tricuspid Valve: The tricuspid valve is normal in structure. Tricuspid valve regurgitation is not demonstrated. Aortic Valve: The aortic valve was not well visualized. Aortic valve regurgitation is not visualized. No aortic stenosis is present. Pulmonic Valve: The pulmonic valve was not well visualized. Pulmonic valve regurgitation is not visualized. Aorta: The aortic root was not well visualized. IAS/Shunts: No atrial level shunt detected by color flow Doppler. Additional Comments: A pacer wire is visualized.  LEFT VENTRICLE PLAX 2D LVIDd:         4.50 cm LVIDs:         3.10 cm LV PW:         1.10 cm LV IVS:        1.20 cm  LEFT ATRIUM           Index LA diam:      3.40 cm 2.63 cm/m LA Vol (A2C): 40.0 ml 30.95 ml/m  AORTIC VALVE LVOT Vmax:   129.00 cm/s LVOT Vmean:  91.300 cm/s LVOT VTI:    0.263 m  AORTA Ao Root diam: 2.90 cm  SHUNTS Systemic VTI: 0.26 m Dani Gobble Croitoru MD Electronically signed by Sanda Klein MD Signature Date/Time: 10/18/2019/3:11:54 PM    Final    DG ESOPHAGUS W SINGLE CM (SOL OR THIN BA)  Result Date: 10/18/2019 CLINICAL DATA:  Odynophagia and dysphagia.  Recent aspiration. EXAM: ESOPHOGRAM/BARIUM SWALLOW TECHNIQUE: Single contrast examination was performed using  thin barium. FLUOROSCOPY TIME:  Fluoroscopy Time:  1 minutes 54 seconds Radiation Exposure Index (if provided by the fluoroscopic device): 16.6 mGy Number of Acquired Spot Images: 0 COMPARISON:  None. FINDINGS: Single contrast esophagram was performed with patient in semi erect position due to her immobility and inability to stand because of a broken leg. A moderate smoothly tapered stricture is seen at the gastroesophageal junction. No other soft esophageal strictures are seen. Severe esophageal dysmotility is seen with  break up of primary peristalsis in the proximal stomach and intermittent tertiary contractions causing esophageal stasis. No gastroesophageal reflux was seen during the exam. No evidence of hiatal hernia. IMPRESSION: Severe esophageal dysmotility. Moderate smoothly-tapered stricture  at the gastroesophageal junction. No evidence of hiatal hernia.  No gastroesophageal reflux seen. Electronically Signed   By: Marlaine Hind M.D.   On: 10/18/2019 10:26    Scheduled Meds: . enoxaparin (LOVENOX) injection  30 mg Subcutaneous Q24H  . gabapentin  100 mg Oral BID  . levothyroxine  100 mcg Oral Q0600  . pantoprazole (PROTONIX) IV  40 mg Intravenous Q24H   Continuous Infusions: . ampicillin-sulbactam (UNASYN) IV 3 g (10/19/19 5176)  . dextrose 5% lactated ringers     PRN Meds: acetaminophen, ondansetron (ZOFRAN) IV  Time spent: 35 minutes  Author: Berle Mull, MD [facility] 10/19/2019 8:42 AM  To reach On-call, see care teams to locate the attending and reach out via [email or web address]. Between 7PM-7AM, please contact night-coverage If you still have difficulty reaching the attending provider, please page the [facility] (Director on Call) for [facility] on amion for assistance.

## 2019-10-19 NOTE — Evaluation (Signed)
Physical Therapy Evaluation Patient Details Name: Jordan [name] MRN: [id number] DOB: [birth year] Today's Date: 10/19/2019   History of Present Illness  86 yo female admitted with asp pna, N/V. Recent hx (1week PTA) of R distal fib fx-non op/CAM boot. Hx of dementia, CKD, CHF, anemia, pacemaker, chronic pain, OA, Afib. Pt is Spanish Speaking.  Clinical Impression  On eval, pt required Min assist for mobility. She walked ~15 feet around the room with a RW. Pt tolerated activity well. O2 96% on RA during session. Daughter was present and assisted with translating. She is also very hands-on. Plan is for pt to return home with family assistance once medically ready. Will follow during hospital stay.     Follow Up Recommendations Home health PT;Supervision/Assistance - 24 hour    Equipment Recommendations  None recommended by PT    Recommendations for Other Services       Precautions / Restrictions Precautions Precautions: Fall Required Braces or Orthoses: Other Brace Other Brace: CAM boot R LE Restrictions Weight Bearing Restrictions: No RLE Weight Bearing: Weight bearing as tolerated      Mobility  Bed Mobility Overal bed mobility: Needs Assistance Bed Mobility: Supine to Sit;Sit to Supine     Supine to sit: Min assist;HOB elevated Sit to supine: Min assist;HOB elevated   General bed mobility comments: Unable to accurately assess-family insists upon helping pt instead of allowing therapist to see what pt is able to do for herself  Transfers Overall transfer level: Needs assistance Equipment used: Rolling walker (2 wheeled) Transfers: Sit to/from Stand Sit to Stand: Min assist         General transfer comment: Assist to power up, stabilize, control descent. Cues provided by daughter  Ambulation/Gait Ambulation/Gait assistance: Min assist Gait Distance (Feet): 15 Feet Assistive device: Rolling walker (2 wheeled) Gait Pattern/deviations: Step-through pattern;Decreased  stride length     General Gait Details: Unsteady. At risk for further falls. Assist to stabilize pt and manage/maneuver RW throuhgout distance. Pt has difficulty advancing R LE intermittently 2* heavy CAM boot. Pt tolerated distance well. No wheezing or dyspnea noted.  Stairs            Wheelchair Mobility    Modified Rankin (Stroke Patients Only)       Balance Overall balance assessment: Needs assistance;History of Falls         Standing balance support: Bilateral upper extremity supported Standing balance-Leahy Scale: Poor                               Pertinent Vitals/Pain Pain Assessment: Faces Faces Pain Scale: No hurt    Home Living Family/patient expects to be discharged to:: Private residence Living Arrangements: Children Available Help at Discharge: Family;Available 24 hours/day           Home Equipment: Walker - 4 wheels;Walker - 2 wheels;Wheelchair - manual      Prior Function Level of Independence: Needs assistance   Gait / Transfers Assistance Needed: Uses RW for household distance ambualtion with assistance from family  ADL's / Homemaking Assistance Needed: family assists with all ADLs        Hand Dominance        Extremity/Trunk Assessment   Upper Extremity Assessment Upper Extremity Assessment: Generalized weakness    Lower Extremity Assessment Lower Extremity Assessment: Generalized weakness    Cervical / Trunk Assessment Cervical / Trunk Assessment: Kyphotic  Communication   Communication: Prefers language other than  English  Cognition Arousal/Alertness: Awake/alert Behavior During Therapy: WFL for tasks assessed/performed Overall Cognitive Status: History of cognitive impairments - at baseline                                 General Comments: Pt appears to follow 1 step commands from daughter well      General Comments      Exercises     Assessment/Plan    PT Assessment Patient needs  continued PT services  PT Problem List Decreased strength;Decreased mobility;Decreased activity tolerance;Decreased balance;Decreased knowledge of use of DME;Pain;Decreased cognition       PT Treatment Interventions DME instruction;Gait training;Therapeutic activities;Therapeutic exercise;Patient/family education;Balance training;Functional mobility training    PT Goals (Current goals can be found in the Care Plan section)  Acute Rehab PT Goals Patient Stated Goal: per daughter, for pt to get better and return home PT Goal Formulation: With family Time For Goal Achievement: 11/02/19 Potential to Achieve Goals: Fair    Frequency Min 3X/week   Barriers to discharge        Co-evaluation               AM-PAC PT "6 Clicks" Mobility  Outcome Measure Help needed turning from your back to your side while in a flat bed without using bedrails?: A Little Help needed moving from lying on your back to sitting on the side of a flat bed without using bedrails?: A Little Help needed moving to and from a bed to a chair (including a wheelchair)?: A Little Help needed standing up from a chair using your arms (e.g., wheelchair or bedside chair)?: A Little Help needed to walk in hospital room?: A Little Help needed climbing 3-5 steps with a railing? : A Little 6 Click Score: 18    End of Session Equipment Utilized During Treatment: Other (comment);Gait belt (CAM boot) Activity Tolerance: Patient tolerated treatment well Patient left: in bed;with call bell/phone within reach;with bed alarm set;with family/visitor present   PT Visit Diagnosis: Unsteadiness on feet (R26.81);Muscle weakness (generalized) (M62.81);History of falling (Z91.81);Difficulty in walking, not elsewhere classified (R26.2)    Time: 8338-2505 PT Time Calculation (min) (ACUTE ONLY): 21 min   Charges:   PT Evaluation $PT Eval Low Complexity: [place], PT Acute Rehabilitation  Office:  [phone] Pager: [phone]

## 2019-10-19 NOTE — Progress Notes (Signed)
Called and spoke to daughter [facility] regarding medications for the evening. Blood sugar management. Blood pressure management. Daughter stated that she does not want the gabapentin given to her mother tonight due to increase confusion at night. Will inform son [name].   Daughter agreeable to plan .

## 2019-10-19 NOTE — Progress Notes (Signed)
SLP Cancellation Note  Patient Details Name: SUNAINA FERRANDO MRN: [id number] DOB: 05/24/1919   Cancelled treatment:       Reason Eval/Treat Not Completed: Other: nursing deferred d/t GI consult pending; EGD pending; ST will continue efforts.   Elvina Sidle, M.S., CCC-SLP 10/19/2019, 10:43 AM

## 2019-10-19 NOTE — Progress Notes (Signed)
Subjective: Patient seen and examined at bedside in presence of her daughter. Sleeping comfortably on oxygen via nasal cannula.  Objective: Vital signs in last 24 hours: Temp:  [97.9 F (36.6 C)-98.1 F (36.7 C)] 98 F (36.7 C) (06/19 1434) Pulse Rate:  [72-79] 72 (06/19 1434) Resp:  [16-18] 18 (06/19 1434) BP: (160-165)/(54-66) 165/54 (06/19 1434) SpO2:  [96 %-100 %] 100 % (06/19 1434) Weight change:  Last BM Date: 10/14/19  PE: Appears cachectic, malnourished GENERAL: Mild pallor, no icterus ABDOMEN: Soft, nondistended, nontender, normoactive bowel sounds EXTREMITIES: Thin extremities  Lab Results: Results for orders placed or performed during the hospital encounter of 10/16/19 (from the past 48 hour(s))  Basic metabolic panel     Status: Abnormal   Collection Time: 10/18/19  3:56 AM  Result Value Ref Range   Sodium 133 (L) 135 - 145 mmol/L   Potassium 4.1 3.5 - 5.1 mmol/L   Chloride 100 98 - 111 mmol/L   CO2 27 22 - 32 mmol/L   Glucose, Bld 67 (L) 70 - 99 mg/dL    Comment: Glucose reference range applies only to samples taken after fasting for at least 8 hours.   BUN 28 (H) 8 - 23 mg/dL   Creatinine, Ser 1.23 (H) 0.44 - 1.00 mg/dL   Calcium 8.1 (L) 8.9 - 10.3 mg/dL   GFR calc non Af Amer 36 (L) >60 mL/min   GFR calc Af Amer 42 (L) >60 mL/min   Anion gap 6 5 - 15    Comment: Performed at [facility], [place] [address] [place], [place] [address]  CBC     Status: Abnormal   Collection Time: 10/18/19  3:56 AM  Result Value Ref Range   WBC 5.9 4.0 - 10.5 K/uL   RBC 2.64 (L) 3.87 - 5.11 MIL/uL   Hemoglobin 8.9 (L) 12.0 - 15.0 g/dL   HCT 26.9 (L) 36 - 46 %   MCV 101.9 (H) 80.0 - 100.0 fL   MCH 33.7 26.0 - 34.0 pg   MCHC 33.1 30.0 - 36.0 g/dL   RDW 16.4 (H) 11.5 - 15.5 %   Platelets 91 (L) 150 - 400 K/uL    Comment: CONSISTENT WITH PREVIOUS RESULT Immature Platelet Fraction may be clinically indicated, consider ordering this additional  test [id number] REPEATED TO VERIFY    nRBC 0.0 0.0 - 0.2 %    Comment: Performed at [facility], [place] [address], [place], [place] [address]  Magnesium     Status: None   Collection Time: 10/18/19  3:56 AM  Result Value Ref Range   Magnesium 1.9 1.7 - 2.4 mg/dL    Comment: Performed at [facility], [place] [address] [place], [place] [address]  Basic metabolic panel     Status: Abnormal   Collection Time: 10/19/19  3:50 AM  Result Value Ref Range   Sodium 133 (L) 135 - 145 mmol/L   Potassium 4.2 3.5 - 5.1 mmol/L   Chloride 99 98 - 111 mmol/L   CO2 25 22 - 32 mmol/L   Glucose, Bld 60 (L) 70 - 99 mg/dL    Comment: Glucose reference range applies only to samples taken after fasting for at least 8 hours.   BUN 33 (H) 8 - 23 mg/dL   Creatinine, Ser 1.28 (H) 0.44 - 1.00 mg/dL   Calcium 8.1 (L) 8.9 - 10.3 mg/dL   GFR calc non Af Amer 35 (L) >60 mL/min   GFR calc Af  Amer 40 (L) >60 mL/min   Anion gap 9 5 - 15    Comment: Performed at [facility], [place] [address] [place], [place] [address]  CBC     Status: Abnormal   Collection Time: 10/19/19  3:50 AM  Result Value Ref Range   WBC 4.7 4.0 - 10.5 K/uL   RBC 2.54 (L) 3.87 - 5.11 MIL/uL   Hemoglobin 8.4 (L) 12.0 - 15.0 g/dL   HCT 26.1 (L) 36 - 46 %   MCV 102.8 (H) 80.0 - 100.0 fL   MCH 33.1 26.0 - 34.0 pg   MCHC 32.2 30.0 - 36.0 g/dL   RDW 16.3 (H) 11.5 - 15.5 %   Platelets 106 (L) 150 - 400 K/uL    Comment: CONSISTENT WITH PREVIOUS RESULT Immature Platelet Fraction may be clinically indicated, consider ordering this additional test [id number] REPEATED TO VERIFY    nRBC 0.0 0.0 - 0.2 %    Comment: Performed at [facility], [place] [address] [place], [place] [address]  CBC with Differential/Platelet     Status: Abnormal   Collection Time: 10/19/19  8:24 AM  Result Value Ref Range   WBC 5.0 4.0 - 10.5 K/uL   RBC 2.87 (L) 3.87 - 5.11 MIL/uL   Hemoglobin 9.6  (L) 12.0 - 15.0 g/dL   HCT 29.5 (L) 36 - 46 %   MCV 102.8 (H) 80.0 - 100.0 fL   MCH 33.4 26.0 - 34.0 pg   MCHC 32.5 30.0 - 36.0 g/dL   RDW 16.4 (H) 11.5 - 15.5 %   Platelets 128 (L) 150 - 400 K/uL   nRBC 0.0 0.0 - 0.2 %   Neutrophils Relative % 71 %   Neutro Abs 3.5 1.7 - 7.7 K/uL   Lymphocytes Relative 20 %   Lymphs Abs 1.0 0.7 - 4.0 K/uL   Monocytes Relative 9 %   Monocytes Absolute 0.5 0 - 1 K/uL   Eosinophils Relative 0 %   Eosinophils Absolute 0.0 0 - 0 K/uL   Basophils Relative 0 %   Basophils Absolute 0.0 0 - 0 K/uL   Immature Granulocytes 0 %   Abs Immature Granulocytes 0.01 0.00 - 0.07 K/uL    Comment: Performed at [facility], [place] [address], [place], [place] [address]  Reticulocytes     Status: Abnormal   Collection Time: 10/19/19  8:24 AM  Result Value Ref Range   Retic Ct Pct 2.8 0.4 - 3.1 %   RBC. 2.85 (L) 3.87 - 5.11 MIL/uL   Retic Count, Absolute 80.9 19.0 - 186.0 K/uL   Immature Retic Fract 15.8 2.3 - 15.9 %    Comment: Performed at [facility], [place] [address], [place],  [address]  Protime-INR     Status: None   Collection Time: 10/19/19  8:24 AM  Result Value Ref Range   Prothrombin Time 14.3 11.4 - 15.2 seconds   INR 1.2 0.8 - 1.2    Comment: (NOTE) INR goal varies based on device and disease states. Performed at [facility], [place] [address] [place],  [address]   Type and screen [place]     Status: None   Collection Time: 10/19/19  9:03 AM  Result Value Ref Range   ABO/RH(D) O POS    Antibody Screen NEG    Sample Expiration      10/22/2019,2359 Performed at [facility], [place] [address] [place],  [place] [address]   ABO/Rh     Status: None   Collection Time: 10/19/19  9:07 AM  Result Value Ref Range   ABO/RH(D)      O POS Performed at [facility], [place] [address] [place], [place] [address]   Glucose, capillary      Status: Abnormal   Collection Time: 10/19/19 11:55 AM  Result Value Ref Range   Glucose-Capillary 43 (LL) 70 - 99 mg/dL    Comment: Glucose reference range applies only to samples taken after fasting for at least 8 hours.   Comment 1 Notify RN   Glucose, capillary     Status: Abnormal   Collection Time: 10/19/19 12:30 PM  Result Value Ref Range   Glucose-Capillary 127 (H) 70 - 99 mg/dL    Comment: Glucose reference range applies only to samples taken after fasting for at least 8 hours.   *Note: Due to a large number of results and/or encounters for the requested time period, some results have not been displayed. A complete set of results can be found in Results Review.    Studies/Results: DG Abd Portable 1V  Result Date: 10/19/2019 CLINICAL DATA:  Constipation. EXAM: PORTABLE ABDOMEN - 1 VIEW COMPARISON:  10/17/2019 FINDINGS: Bowel gas pattern is nonobstructive. There is barium over the right colon and transverse colon due to patient's barium swallow yesterday. No evidence of free peritoneal air. Findings suggesting diverticulosis over the rectosigmoid colon. Severe degenerate changes of the spine with curvature of the lumbar spine convex left. Mild degenerate changes of the hips. IMPRESSION: Nonobstructive bowel gas pattern. Residual barium over the right and transverse colon. Mild colonic diverticulosis. Electronically Signed   By: Marin Olp M.D.   On: 10/19/2019 11:36   ECHOCARDIOGRAM COMPLETE  Result Date: 10/18/2019    ECHOCARDIOGRAM REPORT   Patient Name:   Jordan [name] Hagberg Date of Exam: 10/18/2019 Medical Rec #:  [id number]  Height:       53.0 in Accession #:    [id number] Weight:       104.1 lb Date of Birth:  Sep 08, 1919  BSA:          1.292 m Patient Age:    86 years   BP:           138/49 mmHg Patient Gender: F          HR:           77 bpm. Exam Location:  Inpatient Procedure: 2D Echo Indications:    CHF 428  History:        Patient has prior history of Echocardiogram examinations,  most                 recent 07/29/2014. CHF, Pacemaker; Risk Factors:Hypertension and                 Dyslipidemia. Heart block. loop recorder implant.  Sonographer:    Jannett Celestine RDCS (AE) Referring Phys: [id number] [place]  1. Left ventricular ejection fraction, by estimation, is 60 to 65%. The left ventricle has normal function. The left ventricle has no regional wall motion abnormalities. Indeterminate diastolic filling due to E-A fusion.  2. Right ventricular systolic function is normal. The right ventricular size is normal.  3. The mitral valve is normal in structure. No evidence of mitral valve regurgitation. No evidence of mitral stenosis.  4. The aortic valve was not well visualized. Aortic valve regurgitation is not visualized. No aortic stenosis is present. Comparison(s):  Prior images unable to be directly viewed, comparison made by report only. FINDINGS  Left Ventricle: Left ventricular ejection fraction, by estimation, is 60 to 65%. The left ventricle has normal function. The left ventricle has no regional wall motion abnormalities. The left ventricular internal cavity size was normal in size. There is  no left ventricular hypertrophy. Indeterminate diastolic filling due to E-A fusion. Right Ventricle: The right ventricular size is normal. No increase in right ventricular wall thickness. Right ventricular systolic function is normal. Left Atrium: Left atrial size was normal in size. Right Atrium: Right atrial size was normal in size. Pericardium: There is no evidence of pericardial effusion. Mitral Valve: The mitral valve is normal in structure. Mild mitral annular calcification. No evidence of mitral valve regurgitation. No evidence of mitral valve stenosis. Tricuspid Valve: The tricuspid valve is normal in structure. Tricuspid valve regurgitation is not demonstrated. Aortic Valve: The aortic valve was not well visualized. Aortic valve regurgitation is not visualized. No aortic  stenosis is present. Pulmonic Valve: The pulmonic valve was not well visualized. Pulmonic valve regurgitation is not visualized. Aorta: The aortic root was not well visualized. IAS/Shunts: No atrial level shunt detected by color flow Doppler. Additional Comments: A pacer wire is visualized.  LEFT VENTRICLE PLAX 2D LVIDd:         4.50 cm LVIDs:         3.10 cm LV PW:         1.10 cm LV IVS:        1.20 cm  LEFT ATRIUM           Index LA diam:      3.40 cm 2.63 cm/m LA Vol (A2C): 40.0 ml 30.95 ml/m  AORTIC VALVE LVOT Vmax:   129.00 cm/s LVOT Vmean:  91.300 cm/s LVOT VTI:    0.263 m  AORTA Ao Root diam: 2.90 cm  SHUNTS Systemic VTI: 0.26 m Dani Gobble Croitoru MD Electronically signed by Sanda Klein MD Signature Date/Time: 10/18/2019/3:11:54 PM    Final    DG ESOPHAGUS W SINGLE CM (SOL OR THIN BA)  Result Date: 10/18/2019 CLINICAL DATA:  Odynophagia and dysphagia.  Recent aspiration. EXAM: ESOPHOGRAM/BARIUM SWALLOW TECHNIQUE: Single contrast examination was performed using  thin barium. FLUOROSCOPY TIME:  Fluoroscopy Time:  1 minutes 54 seconds Radiation Exposure Index (if provided by the fluoroscopic device): 16.6 mGy Number of Acquired Spot Images: 0 COMPARISON:  None. FINDINGS: Single contrast esophagram was performed with patient in semi erect position due to her immobility and inability to stand because of a broken leg. A moderate smoothly tapered stricture is seen at the gastroesophageal junction. No other soft esophageal strictures are seen. Severe esophageal dysmotility is seen with break up of primary peristalsis in the proximal stomach and intermittent tertiary contractions causing esophageal stasis. No gastroesophageal reflux was seen during the exam. No evidence of hiatal hernia. IMPRESSION: Severe esophageal dysmotility. Moderate smoothly-tapered stricture at the gastroesophageal junction. No evidence of hiatal hernia.  No gastroesophageal reflux seen. Electronically Signed   By: Marlaine Hind M.D.   On:  10/18/2019 10:26    Medications: I have reviewed the patient's current medications.  Assessment: Dysphagia Malnutrition Moderate smoothly tapered stricture at GE junction with severe esophageal dysmotility Macrocytic anemia Was on Eliquis  Plan: Start full liquid diet today and continue tomorrow. Plan EGD with balloon dilatation with Dr. Penelope Coop on Monday. Continue to hold Eliquis. The risks and the benefits of the procedure were discussed with the patient' daughter in details. She  understands and verbalizes consent. Okay to resume other oral medications.  Ronnette Juniper, MD 10/19/2019, 2:53 PM

## 2019-10-20 ENCOUNTER — Inpatient Hospital Stay (HOSPITAL_COMMUNITY): Payer: Medicare Other

## 2019-10-20 LAB — GLUCOSE, CAPILLARY
Glucose-Capillary: 108 mg/dL — ABNORMAL HIGH (ref 70–99)
Glucose-Capillary: 111 mg/dL — ABNORMAL HIGH (ref 70–99)
Glucose-Capillary: 120 mg/dL — ABNORMAL HIGH (ref 70–99)
Glucose-Capillary: 133 mg/dL — ABNORMAL HIGH (ref 70–99)
Glucose-Capillary: 134 mg/dL — ABNORMAL HIGH (ref 70–99)
Glucose-Capillary: 134 mg/dL — ABNORMAL HIGH (ref 70–99)

## 2019-10-20 LAB — CBC
HCT: 27.9 % — ABNORMAL LOW (ref 36.0–46.0)
Hemoglobin: 9.1 g/dL — ABNORMAL LOW (ref 12.0–15.0)
MCH: 33.2 pg (ref 26.0–34.0)
MCHC: 32.6 g/dL (ref 30.0–36.0)
MCV: 101.8 fL — ABNORMAL HIGH (ref 80.0–100.0)
Platelets: 118 10*3/uL — ABNORMAL LOW (ref 150–400)
RBC: 2.74 MIL/uL — ABNORMAL LOW (ref 3.87–5.11)
RDW: 16.7 % — ABNORMAL HIGH (ref 11.5–15.5)
WBC: 5.9 10*3/uL (ref 4.0–10.5)
nRBC: 0 % (ref 0.0–0.2)

## 2019-10-20 LAB — BASIC METABOLIC PANEL
Anion gap: 3 — ABNORMAL LOW (ref 5–15)
BUN: 32 mg/dL — ABNORMAL HIGH (ref 8–23)
CO2: 27 mmol/L (ref 22–32)
Calcium: 8.3 mg/dL — ABNORMAL LOW (ref 8.9–10.3)
Chloride: 105 mmol/L (ref 98–111)
Creatinine, Ser: 1.22 mg/dL — ABNORMAL HIGH (ref 0.44–1.00)
GFR calc Af Amer: 43 mL/min — ABNORMAL LOW (ref >60)
GFR calc non Af Amer: 37 mL/min — ABNORMAL LOW (ref >60)
Glucose, Bld: 119 mg/dL — ABNORMAL HIGH (ref 70–99)
Potassium: 4.1 mmol/L (ref 3.5–5.1)
Sodium: 135 mmol/L (ref 135–145)

## 2019-10-20 MED ORDER — NEPRO/CARBSTEADY PO LIQD
237.0000 mL | Freq: Two times a day (BID) | ORAL | Status: DC
  Administered 2019-10-20 – 2019-10-23 (×7): 237 mL via ORAL
  Filled 2019-10-20 (×8): qty 237

## 2019-10-20 MED ORDER — TRAMADOL HCL 50 MG PO TABS: 50.0000 mg | ORAL_TABLET | Freq: Four times a day (QID) | ORAL | Status: DC | PRN

## 2019-10-20 MED ORDER — TRAMADOL HCL 50 MG PO TABS: 50.0000 mg | ORAL_TABLET | Freq: Two times a day (BID) | ORAL | Status: DC | PRN

## 2019-10-20 MED ORDER — ALBUTEROL SULFATE (2.5 MG/3ML) 0.083% IN NEBU: 2.5000 mg | INHALATION_SOLUTION | RESPIRATORY_TRACT | Status: DC | PRN

## 2019-10-20 NOTE — Progress Notes (Signed)
[facility] Progress Note  Patient: Jordan [name]    EXB:[id number]  DOA: 10/16/2019     Date of Service: the patient was seen and examined on 10/20/2019  Brief hospital course: Patient presents with complaints of shortness of breath and fatigue after presentation in the ER with aspiration event at home a few days ago.  Found to have aspiration pneumonia as well as esophageal dysmotility and stricture. Currently plan is follow-up on GI recommendation regarding timing of EGD.  Assessment and Plan: 1.  Acute hypoxic respiratory failure/resolved. Aspiration pneumonia. Esophageal dysfunction. Cultures so far negative for more than 48 hours. Oxygenation improving currently on room air. Still has bilateral crackles and Occasional wheezing.  No leukocytosis or fever. Some cough. Repeat chest x ray does not show any new abnormality  Continue with IV Unasyn for now while the patient is remaining NPO. Last day tomorrow.  Stop IV hydration for now. Recommendation for incentive spirometry and flutter valve given to pt   2.  Esophageal dysmotility. Small esophageal stricture. Prior history of the same. [facility] gastroenterology consulted. Currently awaiting EGD for dilatation. Last Eliquis dose was noon 10/18/2019.  48 hours will be 10/20/2019 afternoon. May be able to tolerate liquid diet.  3.  Intractable nausea and vomiting. Constipation. Resolved  X-ray abdomen on the day of admission was not significant. Monitor.  Currently no further nausea or vomiting.  4.  Recent nondisplaced fracture of right distal fibula Cam boot with ambulation. Outpatient follow-up with orthopedics.  Pain well controlled.  No swelling.  5.  Paroxysmal A. fib. Accelerated hypertension Heart rate currently controlled. Patient is on Eliquis currently on hold. Blood pressure mildly elevated.  Will initiate as needed hydralazine  6.  Bicytopenia. Chronic macrocytic anemia.  Acute worsening secondary to  dilution. Chronic thrombocytopenia currently stable. Likely in the setting of nutritional deficiency. Normal B12  7.  Hypothyroidism Continue Synthroid. Normal TSH and free T4.  8.  Depression. Adult failure to thrive. Home dose of Remeron and trazodone currently on hold to minimize medication as well as to minimize confusion  9.  History of gout CrCl 14.1. Per pharmacy colchicine and allopurinol currently both on hold.  10.  Chronic renal disease stage IIIa. Mild worsening. GFR less than 50. CrCl 14. Gentle IV hydration.  Monitor.  11.  Elevated BNP. Chronic diastolic dysfunction. On Lasix as needed. Echocardiogram performed this admission shows preserved EF without any significant valvular or wall motion abnormality. Monitor while the patient is receiving IV fluids.  12.  Hypoglycemia. Started on D5. CBG every 4 hours.  13.  Language barrier. Patient needs Spanish interpreter although unable to use video interpreter successfully due to hearing difficulty. Family helps to interpret and prefers to use them as opposed to any interpreter.  Diet: full liquid diet, NPO after midnight DVT Prophylaxis: SCD, pharmacological prophylaxis contraindicated due to Anemia and thrombocytopenia   Advance goals of care discussion: DNR  Family Communication: family was present at bedside, at the time of interview.  Opportunity was given to ask question and all questions were answered satisfactorily.   Disposition:  Status is: Inpatient  Remains inpatient appropriate because:Ongoing diagnostic testing needed not appropriate for outpatient work up and IV treatments appropriate due to intensity of illness or inability to take PO   Dispo: The patient is from: Home              Anticipated d/c is to: SNF  Anticipated d/c date is: > 3 days              Patient currently is not medically stable to d/c.  Subjective: no nausea or vomiting. No fever or chills. Has some bm last  night.   Physical Exam:  General: Appear in mild distress, no Rash; Oral Mucosa Clear, moist. no Abnormal Neck Mass Or lumps, Conjunctiva normal  Cardiovascular: S1 and S2 Present, no Murmur, Respiratory: good respiratory effort, Bilateral Air entry present and bilateral  Crackles, Occasional wheezes Abdomen: Bowel Sound absent, Soft and no tenderness Extremities: no Pedal edema, no calf tenderness Neurology: alert and oriented to place and person affect flat in affect. no new focal deficit Gait not checked due to patient safety concerns  Vitals:   10/19/19 2015 10/20/19 0418 10/20/19 1402 10/20/19 1715  BP: (!) 133/44 (!) 146/53 (!) 171/48 (!) 150/51  Pulse: 78 72 83 71  Resp: 16 16 16    Temp: 98 F (36.7 C) 97.7 F (36.5 C) 98.1 F (36.7 C)   TempSrc:   Oral   SpO2: 99% 100% 100%   Weight:      Height:        Intake/Output Summary (Last 24 hours) at 10/20/2019 1746 Last data filed at 10/20/2019 1245 Gross per 24 hour  Intake 870 ml  Output 100 ml  Net 770 ml   Filed Weights   10/17/19 0600  Weight: 47.2 kg    Data Reviewed: I have personally reviewed and interpreted daily labs, tele strips, imagings as discussed above. I reviewed all nursing notes, pharmacy notes, vitals, pertinent old records I have discussed plan of care as described above with RN and patient/family.  CBC: Recent Labs  Lab 10/16/19 1800 10/18/19 0356 10/19/19 0350 10/19/19 0824 10/20/19 0608  WBC 8.7 5.9 4.7 5.0 5.9  NEUTROABS 7.4  --   --  3.5  --   HGB 9.9* 8.9* 8.4* 9.6* 9.1*  HCT 30.0* 26.9* 26.1* 29.5* 27.9*  MCV 101.4* 101.9* 102.8* 102.8* 101.8*  PLT 97* 91* 106* 128* 294*   Basic Metabolic Panel: Recent Labs  Lab 10/16/19 1800 10/18/19 0356 10/19/19 0350 10/20/19 0608  NA 131* 133* 133* 135  K 4.3 4.1 4.2 4.1  CL 94* 100 99 105  CO2 28 27 25 27   GLUCOSE 102* 67* 60* 119*  BUN 25* 28* 33* 32*  CREATININE 1.05* 1.23* 1.28* 1.22*  CALCIUM 8.2* 8.1* 8.1* 8.3*  MG  --   1.9  --   --     Studies: DG CHEST PORT 1 VIEW  Result Date: 10/20/2019 CLINICAL DATA:  Patient to have EGD tomorrow. EXAM: PORTABLE CHEST 1 VIEW COMPARISON:  October 17, 2019 FINDINGS: The heart, hila, and mediastinum are normal. No pneumothorax. Stable pacemaker. Mild opacity at the right base is consistent with overlapping chondral calcifications and vascular crowding. No suspicious infiltrates are identified. IMPRESSION: No acute abnormalities. Electronically Signed   By: Dorise Bullion III M.D   On: 10/20/2019 13:47    Scheduled Meds:  enoxaparin (LOVENOX) injection  30 mg Subcutaneous Q24H   feeding supplement (NEPRO CARB STEADY)  237 mL Oral BID BM   feeding supplement (PRO-STAT SUGAR FREE 64)  30 mL Oral BID   fesoterodine  4 mg Oral Daily   gabapentin  100 mg Oral BID   levothyroxine  100 mcg Oral Q0600   pantoprazole (PROTONIX) IV  40 mg Intravenous Q24H   Continuous Infusions:  ampicillin-sulbactam (UNASYN) IV Stopped (10/20/19 7654)  PRN Meds: acetaminophen, albuterol, hydrALAZINE, ondansetron (ZOFRAN) IV, traMADol  Time spent: 35 minutes  Author: Berle Mull, MD [facility] 10/20/2019 5:46 PM  To reach On-call, see care teams to locate the attending and reach out via [email or web address]. Between 7PM-7AM, please contact night-coverage If you still have difficulty reaching the attending provider, please page the [facility] (Director on Call) for [facility] on amion for assistance.

## 2019-10-21 ENCOUNTER — Encounter (HOSPITAL_COMMUNITY): Payer: Self-pay | Admitting: Family Medicine

## 2019-10-21 ENCOUNTER — Inpatient Hospital Stay (HOSPITAL_COMMUNITY): Payer: Medicare Other | Admitting: Anesthesiology

## 2019-10-21 ENCOUNTER — Encounter (HOSPITAL_COMMUNITY)
Admission: EM | Disposition: A | Discharge status: Home / Self Care | Payer: Self-pay | Source: Home / Self Care | Attending: Internal Medicine

## 2019-10-21 HISTORY — PX: ESOPHAGOGASTRODUODENOSCOPY (EGD) WITH PROPOFOL: SHX5813

## 2019-10-21 LAB — GLUCOSE, CAPILLARY
Glucose-Capillary: 122 mg/dL — ABNORMAL HIGH (ref 70–99)
Glucose-Capillary: 133 mg/dL — ABNORMAL HIGH (ref 70–99)
Glucose-Capillary: 136 mg/dL — ABNORMAL HIGH (ref 70–99)
Glucose-Capillary: 155 mg/dL — ABNORMAL HIGH (ref 70–99)
Glucose-Capillary: 93 mg/dL (ref 70–99)
Glucose-Capillary: 95 mg/dL (ref 70–99)
Glucose-Capillary: 98 mg/dL (ref 70–99)

## 2019-10-21 LAB — CULTURE, BLOOD (ROUTINE X 2)
Culture: NO GROWTH
Culture: NO GROWTH

## 2019-10-21 LAB — BASIC METABOLIC PANEL
Anion gap: 5 (ref 5–15)
BUN: 31 mg/dL — ABNORMAL HIGH (ref 8–23)
CO2: 29 mmol/L (ref 22–32)
Calcium: 8.3 mg/dL — ABNORMAL LOW (ref 8.9–10.3)
Chloride: 106 mmol/L (ref 98–111)
Creatinine, Ser: 1.12 mg/dL — ABNORMAL HIGH (ref 0.44–1.00)
GFR calc Af Amer: 47 mL/min — ABNORMAL LOW (ref >60)
GFR calc non Af Amer: 41 mL/min — ABNORMAL LOW (ref >60)
Glucose, Bld: 111 mg/dL — ABNORMAL HIGH (ref 70–99)
Potassium: 4.1 mmol/L (ref 3.5–5.1)
Sodium: 140 mmol/L (ref 135–145)

## 2019-10-21 LAB — CBC
HCT: 26 % — ABNORMAL LOW (ref 36.0–46.0)
Hemoglobin: 8.3 g/dL — ABNORMAL LOW (ref 12.0–15.0)
MCH: 33.2 pg (ref 26.0–34.0)
MCHC: 31.9 g/dL (ref 30.0–36.0)
MCV: 104 fL — ABNORMAL HIGH (ref 80.0–100.0)
Platelets: 101 10*3/uL — ABNORMAL LOW (ref 150–400)
RBC: 2.5 MIL/uL — ABNORMAL LOW (ref 3.87–5.11)
RDW: 16.7 % — ABNORMAL HIGH (ref 11.5–15.5)
WBC: 4.6 10*3/uL (ref 4.0–10.5)
nRBC: 0 % (ref 0.0–0.2)

## 2019-10-21 SURGERY — ESOPHAGOGASTRODUODENOSCOPY (EGD) WITH PROPOFOL
Anesthesia: Monitor Anesthesia Care

## 2019-10-21 MED ORDER — PROPOFOL 500 MG/50ML IV EMUL
INTRAVENOUS | Status: DC | PRN
  Administered 2019-10-21: 50 ug/kg/min via INTRAVENOUS

## 2019-10-21 MED ORDER — LIDOCAINE HCL (CARDIAC) PF 100 MG/5ML IV SOSY
PREFILLED_SYRINGE | INTRAVENOUS | Status: DC | PRN
  Administered 2019-10-21: 40 mg via INTRAVENOUS

## 2019-10-21 MED ORDER — PROPOFOL 10 MG/ML IV BOLUS
INTRAVENOUS | Status: DC | PRN
  Administered 2019-10-21: 10 mg via INTRAVENOUS

## 2019-10-21 MED ORDER — PROPOFOL 1000 MG/100ML IV EMUL
INTRAVENOUS | Status: AC
  Filled 2019-10-21: qty 100

## 2019-10-21 MED ORDER — LACTATED RINGERS IV SOLN: INTRAVENOUS | Status: DC

## 2019-10-21 MED ORDER — TRAZODONE HCL 50 MG PO TABS
50.0000 mg | ORAL_TABLET | Freq: Every day | ORAL | Status: DC
  Administered 2019-10-21: 50 mg via ORAL
  Filled 2019-10-21 (×2): qty 1

## 2019-10-21 MED ORDER — MIRTAZAPINE 15 MG PO TABS
15.0000 mg | ORAL_TABLET | Freq: Every day | ORAL | Status: DC
  Administered 2019-10-21 – 2019-10-22 (×2): 15 mg via ORAL
  Filled 2019-10-21 (×2): qty 1

## 2019-10-21 MED ORDER — GUAIFENESIN 100 MG/5ML PO SOLN
10.0000 mL | Freq: Four times a day (QID) | ORAL | Status: DC
  Administered 2019-10-21 – 2019-10-23 (×7): 200 mg via ORAL
  Filled 2019-10-21 (×7): qty 10

## 2019-10-21 MED ORDER — METOPROLOL SUCCINATE ER 25 MG PO TB24
25.0000 mg | ORAL_TABLET | Freq: Every day | ORAL | Status: DC
  Administered 2019-10-22 – 2019-10-23 (×2): 25 mg via ORAL
  Filled 2019-10-21 (×2): qty 1

## 2019-10-21 SURGICAL SUPPLY — 15 items

## 2019-10-21 NOTE — Anesthesia Postprocedure Evaluation (Signed)
Anesthesia Post Note  Patient: Jordan [name]  Procedure(s) Performed: ESOPHAGOGASTRODUODENOSCOPY (EGD) WITH PROPOFOL (N/A )     Patient location during evaluation: PACU Anesthesia Type: MAC Level of consciousness: awake and alert Pain management: pain level controlled Vital Signs Assessment: post-procedure vital signs reviewed and stable Respiratory status: spontaneous breathing, nonlabored ventilation, respiratory function stable and patient connected to nasal cannula oxygen Cardiovascular status: stable and blood pressure returned to baseline Anesthetic complications: no   No complications documented.  Last Vitals:  Vitals:   10/21/19 1035 10/21/19 1153  BP: (!) 148/13 (!) 120/50  Pulse:    Resp:    Temp:    SpO2:      Last Pain:  Vitals:   10/21/19 0912  TempSrc:   PainSc: 0-No pain                 Audry Pili

## 2019-10-21 NOTE — Progress Notes (Signed)
[facility] Progress Note  Patient: Jordan [name]    YTK:[id number]  DOA: 10/16/2019     Date of Service: the patient was seen and examined on 10/21/2019  Brief hospital course: Patient presents with complaints of shortness of breath and fatigue after presentation in the ER with aspiration event at home a few days ago.  Found to have aspiration pneumonia as well as esophageal dysmotility and stricture. Currently plan is follow-up on speech eval and improvement in oral diet.  Assessment and Plan: 1.  Acute hypoxic respiratory failure/resolved. Aspiration pneumonia. Esophageal dysfunction. Cultures so far negative for more than 48 hours. Oxygenation improving currently on room air. Still has bilateral crackles and Occasional wheezing.  No leukocytosis or fever. Some cough. Repeat chest x ray does not show any new abnormality  Completed 5 days of  IV Unasyn Stop IV hydration for now. Recommendation for incentive spirometry and flutter valve given to pt.  2.  Esophageal dysmotility. Small esophageal stricture. Prior history of the same. [facility] gastroenterology consulted. Underwent EGD, no stricture.  Likely dysmotility. Last Eliquis dose was noon 10/18/2019.  48 hours will be 10/20/2019 afternoon. May be able to tolerate liquid diet.  3.  Intractable nausea and vomiting. Constipation. Resolved  X-ray abdomen on the day of admission was not significant. Monitor.  Currently no further nausea or vomiting.  4.  Recent nondisplaced fracture of right distal fibula Cam boot with ambulation. Outpatient follow-up with orthopedics.  Pain well controlled.  No swelling.  5.  Paroxysmal A. fib. Accelerated hypertension Heart rate currently controlled. Patient is on Eliquis currently on hold. Blood pressure mildly elevated.  Will initiate as needed hydralazine  6.  Bicytopenia. Chronic macrocytic anemia.  Acute worsening secondary to dilution. Chronic thrombocytopenia currently  stable. Likely in the setting of nutritional deficiency. Normal B12  7.  Hypothyroidism Continue Synthroid. Normal TSH and free T4.  8.  Depression. Adult failure to thrive. Home dose of Remeron and trazodone currently on hold to minimize medication as well as to minimize confusion  9.  History of gout CrCl 14.1. Per pharmacy colchicine and allopurinol currently both on hold.  10.  Chronic renal disease stage IIIa. Mild worsening. GFR less than 50. CrCl 14. Gentle IV hydration was given. Monitor.  11.  Elevated BNP. Chronic diastolic dysfunction. On Lasix as needed. Echocardiogram performed this admission shows preserved EF without any significant valvular or wall motion abnormality.  12.  Hypoglycemia. Started on D5. CBG every 4 hours.  13.  Language barrier. Patient needs Spanish interpreter although unable to use video interpreter successfully due to hearing difficulty. Family helps to interpret and prefers to use them as opposed to any interpreter.  Diet: Soft diet per GI.  Follow-up on speech recommendation. DVT Prophylaxis: SCD, pharmacological prophylaxis contraindicated due to Anemia and thrombocytopenia   Advance goals of care discussion: DNR  Family Communication: family was present at bedside, at the time of interview.  Opportunity was given to ask question and all questions were answered satisfactorily.   Disposition:  Status is: Inpatient  Remains inpatient appropriate because:Ongoing diagnostic testing needed not appropriate for outpatient work up and IV treatments appropriate due to intensity of illness or inability to take PO   Dispo: The patient is from: Home              Anticipated d/c is to: SNF              Anticipated d/c date is: > 3 days  Patient currently is not medically stable to d/c.  Subjective: No acute complaint.  Seen after the procedure.  No fever no chills.  Physical Exam:  General: Appear in mild distress, no  Rash; Oral Mucosa Clear, moist. no Abnormal Neck Mass Or lumps, Conjunctiva normal  Cardiovascular: S1 and S2 Present, no Murmur, Respiratory: good respiratory effort, Bilateral Air entry present and bilateral  Crackles, no wheezes, poor mucus clearance. Abdomen: Bowel Sound absent, Soft and no tenderness Extremities: no Pedal edema, no calf tenderness Neurology: alert and oriented to place and person affect flat in affect. no new focal deficit Gait not checked due to patient safety concerns  Vitals:   10/21/19 0932 10/21/19 1035 10/21/19 1153 10/21/19 1335  BP: (!) 168/58 (!) 148/13 (!) 120/50 (!) 133/49  Pulse:    80  Resp:    16  Temp:    97.9 F (36.6 C)  TempSrc:    Oral  SpO2:    100%  Weight:      Height:        Intake/Output Summary (Last 24 hours) at 10/21/2019 1913 Last data filed at 10/21/2019 1500 Gross per 24 hour  Intake 192.67 ml  Output 0 ml  Net 192.67 ml   Filed Weights   10/17/19 0600  Weight: 47.2 kg    Data Reviewed: I have personally reviewed and interpreted daily labs, tele strips, imagings as discussed above. I reviewed all nursing notes, pharmacy notes, vitals, pertinent old records I have discussed plan of care as described above with RN and patient/family.  CBC: Recent Labs  Lab 10/16/19 1800 10/16/19 1800 10/18/19 0356 10/19/19 0350 10/19/19 0824 10/20/19 0608 10/21/19 0400  WBC 8.7   < > 5.9 4.7 5.0 5.9 4.6  NEUTROABS 7.4  --   --   --  3.5  --   --   HGB 9.9*   < > 8.9* 8.4* 9.6* 9.1* 8.3*  HCT 30.0*   < > 26.9* 26.1* 29.5* 27.9* 26.0*  MCV 101.4*   < > 101.9* 102.8* 102.8* 101.8* 104.0*  PLT 97*   < > 91* 106* 128* 118* 101*   < > = values in this interval not displayed.   Basic Metabolic Panel: Recent Labs  Lab 10/16/19 1800 10/18/19 0356 10/19/19 0350 10/20/19 0608 10/21/19 0400  NA 131* 133* 133* 135 140  K 4.3 4.1 4.2 4.1 4.1  CL 94* 100 99 105 106  CO2 28 27 25 27 29   GLUCOSE 102* 67* 60* 119* 111*  BUN 25* 28*  33* 32* 31*  CREATININE 1.05* 1.23* 1.28* 1.22* 1.12*  CALCIUM 8.2* 8.1* 8.1* 8.3* 8.3*  MG  --  1.9  --   --   --     Studies: No results found.  Scheduled Meds: . enoxaparin (LOVENOX) injection  30 mg Subcutaneous Q24H  . feeding supplement (NEPRO CARB STEADY)  237 mL Oral BID BM  . feeding supplement (PRO-STAT SUGAR FREE 64)  30 mL Oral BID  . fesoterodine  4 mg Oral Daily  . gabapentin  100 mg Oral BID  . guaiFENesin  10 mL Oral QID  . levothyroxine  100 mcg Oral Q0600  . metoprolol succinate  25 mg Oral Daily  . mirtazapine  15 mg Oral QHS  . pantoprazole (PROTONIX) IV  40 mg Intravenous Q24H  . traZODone  50 mg Oral QHS   Continuous Infusions:  PRN Meds: acetaminophen, albuterol, hydrALAZINE, ondansetron (ZOFRAN) IV, traMADol  Time spent: 35 minutes  Author:  Berle Mull, MD [facility] 10/21/2019 7:13 PM  To reach On-call, see care teams to locate the attending and reach out via [email or web address]. Between 7PM-7AM, please contact night-coverage If you still have difficulty reaching the attending provider, please page the [facility] (Director on Call) for [facility] on amion for assistance.

## 2019-10-21 NOTE — Transfer of Care (Signed)
Immediate Anesthesia Transfer of Care Note  Patient: Jordan [name]  Procedure(s) Performed: ESOPHAGOGASTRODUODENOSCOPY (EGD) WITH PROPOFOL (N/A )  Patient Location: PACU  Anesthesia Type:MAC  Level of Consciousness: awake  Airway & Oxygen Therapy: Patient Spontanous Breathing and Patient connected to nasal cannula oxygen  Post-op Assessment: Report given to RN and Post -op Vital signs reviewed and stable  Post vital signs: Reviewed and stable  Last Vitals:  Vitals Value Taken Time  BP 129/32 10/21/19 0834  Temp    Pulse 74 10/21/19 0834  Resp 20 10/21/19 0834  SpO2 97 % 10/21/19 0834    Last Pain:  Vitals:   10/21/19 0725  TempSrc: Oral  PainSc: 0-No pain         Complications: No complications documented.

## 2019-10-21 NOTE — H&P (Signed)
The patient is a 86 year old female who presents to the endoscopy department today for EGD with dilatation of a distal esophageal stricture as seen recently on barium swallow.  She has been experiencing dysphagia.  She is being treated for pneumonia.  She had been on Eliquis but last dose was 5 days ago.  Physical:  No acute distress  Heart regular rhythm  Lungs clear  Abdomen soft and nontender  Impression: Distal esophageal stricture  Plan: EGD with dilatation

## 2019-10-21 NOTE — Progress Notes (Signed)
See EGD report.  No stricture.  We will sign off.  Call us if needed.

## 2019-10-21 NOTE — Anesthesia Preprocedure Evaluation (Addendum)
Anesthesia Evaluation  Patient identified by MRN, date of birth, ID band Patient awake    Reviewed: Allergy & Precautions, NPO status , Patient's Chart, lab work & pertinent test results  History of Anesthesia Complications Negative for: history of anesthetic complications  Airway Mallampati: II  TM Distance: >3 FB Neck ROM: Full    Dental  (+) Edentulous Upper, Edentulous Lower   Pulmonary neg pulmonary ROS,    Pulmonary exam normal        Cardiovascular hypertension, Pt. on medications + CAD and +CHF  Normal cardiovascular exam+ dysrhythmias + pacemaker    '21 TTE - EF 60 to 65%. Indeterminate diastolic filling due to E-A fusion.     Neuro/Psych  Headaches, PSYCHIATRIC DISORDERS Depression Dementia  Neuromuscular disease (peripheral neuropathy) CVA    GI/Hepatic Neg liver ROS, GERD  Medicated and Controlled,  Endo/Other  Hypothyroidism   Renal/GU Renal InsufficiencyRenal disease     Musculoskeletal  (+) Arthritis , Osteoarthritis,   PMR    Abdominal   Peds  Hematology  (+) anemia ,  Thrombocytopenia    Anesthesia Other Findings Covid test negative   Reproductive/Obstetrics                            Anesthesia Physical Anesthesia Plan  ASA: III  Anesthesia Plan: MAC   Post-op Pain Management:    Induction: Intravenous  PONV Risk Score and Plan: 2 and Propofol infusion and Treatment may vary due to age or medical condition  Airway Management Planned: Nasal Cannula and Natural Airway  Additional Equipment: None  Intra-op Plan:   Post-operative Plan:   Informed Consent: I have reviewed the patients History and Physical, chart, labs and discussed the procedure including the risks, benefits and alternatives for the proposed anesthesia with the patient or authorized representative who has indicated his/her understanding and acceptance.   Patient has DNR.  Discussed DNR  with power of attorney and Suspend DNR.   Consent reviewed with POA and Interpreter used for interveiw  Plan Discussed with: CRNA and Anesthesiologist  Anesthesia Plan Comments:       Anesthesia Quick Evaluation

## 2019-10-21 NOTE — Care Management Important Message (Signed)
Important Message  Patient Details IM Letter given to [place] Case Manager to present to the [name] MRN: [id number] Date of Birth: 05-17-1919   Medicare Important Message Given:  Yes     Kerin Salen 10/21/2019, 10:32 AM

## 2019-10-21 NOTE — Op Note (Signed)
[facility] [name] Procedure Date: 10/21/2019 MRN: [id number] Attending MD: Wonda Horner , MD Date of Birth: Mar 19, 1920 CSN: [id number] Age: 86 Admit Type: Inpatient Procedure:                Upper GI endoscopy Indications:              Dysphagia, Abnormal abdominal x-ray of the GI                            tract/ suspected esophageal stricture Providers:                Wonda Horner, MD, Elspeth Cho Tech.,                            Avis Epley, CRNA, Cleda Daub,                            RN Referring MD:              Medicines:                Propofol per Anesthesia Complications:            No immediate complications. Estimated Blood Loss:     Estimated blood loss: none. Procedure:                Pre-Anesthesia Assessment:                           - Prior to the procedure, a History and Physical                            was performed, and patient medications and                            allergies were reviewed. The patient's tolerance of                            previous anesthesia was also reviewed. The risks                            and benefits of the procedure and the sedation                            options and risks were discussed with the patient.                            All questions were answered, and informed consent                            was obtained. Prior Anticoagulants: The patient has                            taken no previous anticoagulant or antiplatelet                            agents. ASA Grade  Assessment: II - A patient with                            mild systemic disease. After reviewing the risks                            and benefits, the patient was deemed in                            satisfactory condition to undergo the procedure.                           After obtaining informed consent, the endoscope was                            passed under direct vision.  Throughout the                            procedure, the patient's blood pressure, pulse, and                            oxygen saturations were monitored continuously. The                            GIF-H190 ([id number]) Olympus gastroscope was                            introduced through the mouth, and advanced to the                            second part of duodenum. The upper GI endoscopy was                            accomplished without difficulty. The patient                            tolerated the procedure well. Scope In: Scope Out: Findings:      The examined esophagus was normal. There is no stricture. There is no       resistance to the passage of the scope.      Erythematous mucosa was found in the stomach.      The examined duodenum was normal. Impression:               - Normal esophagus.                           - Erythematous mucosa in the stomach.                           - Normal examined duodenum.                           - No specimens collected.                           -  I think her dysphagia is due to esophgeal                            dysmotility. Moderate Sedation:      . Recommendation:           - Soft diet.                           - Continue present medications. Procedure Code(s):        --- Professional ---                           [phone], Esophagogastroduodenoscopy, flexible,                            transoral; diagnostic, including collection of                            specimen(s) by brushing or washing, when performed                            (separate procedure) Diagnosis Code(s):        --- Professional ---                           K31.89, Other diseases of stomach and duodenum                           R13.10, Dysphagia, unspecified                           R93.3, Abnormal findings on diagnostic imaging of                            other parts of digestive tract CPT copyright 2019 [employer]. All rights  reserved. The codes documented in this report are preliminary and upon coder review may  be revised to meet current compliance requirements. Wonda Horner, MD 10/21/2019 8:38:51 AM This report has been signed electronically. Number of Addenda: 0

## 2019-10-21 NOTE — Progress Notes (Signed)
PT Cancellation Note  Patient Details Name: SEYNABOU FULTS MRN: [id number] DOB: Mar 27, 1920   Cancelled Treatment:    Reason Eval/Treat Not Completed: Patient at procedure or test/unavailable/EGD.    Claretha Cooper 10/21/2019, 7:34 AM  [place] Pager [phone] Office [phone]

## 2019-10-22 LAB — BASIC METABOLIC PANEL
Anion gap: 3 — ABNORMAL LOW (ref 5–15)
BUN: 33 mg/dL — ABNORMAL HIGH (ref 8–23)
CO2: 31 mmol/L (ref 22–32)
Calcium: 8.5 mg/dL — ABNORMAL LOW (ref 8.9–10.3)
Chloride: 107 mmol/L (ref 98–111)
Creatinine, Ser: 1.08 mg/dL — ABNORMAL HIGH (ref 0.44–1.00)
GFR calc Af Amer: 49 mL/min — ABNORMAL LOW (ref >60)
GFR calc non Af Amer: 43 mL/min — ABNORMAL LOW (ref >60)
Glucose, Bld: 93 mg/dL (ref 70–99)
Potassium: 3.7 mmol/L (ref 3.5–5.1)
Sodium: 141 mmol/L (ref 135–145)

## 2019-10-22 LAB — GLUCOSE, CAPILLARY
Glucose-Capillary: 86 mg/dL (ref 70–99)
Glucose-Capillary: 108 mg/dL — ABNORMAL HIGH (ref 70–99)

## 2019-10-22 LAB — CBC
HCT: 25.8 % — ABNORMAL LOW (ref 36.0–46.0)
Hemoglobin: 8.4 g/dL — ABNORMAL LOW (ref 12.0–15.0)
MCH: 33.9 pg (ref 26.0–34.0)
MCHC: 32.6 g/dL (ref 30.0–36.0)
MCV: 104 fL — ABNORMAL HIGH (ref 80.0–100.0)
Platelets: 95 10*3/uL — ABNORMAL LOW (ref 150–400)
RBC: 2.48 MIL/uL — ABNORMAL LOW (ref 3.87–5.11)
RDW: 17 % — ABNORMAL HIGH (ref 11.5–15.5)
WBC: 5 10*3/uL (ref 4.0–10.5)
nRBC: 0 % (ref 0.0–0.2)

## 2019-10-22 MED ORDER — APIXABAN 2.5 MG PO TABS
2.5000 mg | ORAL_TABLET | Freq: Two times a day (BID) | ORAL | Status: DC
  Administered 2019-10-23: 2.5 mg via ORAL
  Filled 2019-10-22: qty 1

## 2019-10-22 MED ORDER — FUROSEMIDE 20 MG PO TABS
20.0000 mg | ORAL_TABLET | Freq: Once | ORAL | Status: AC
  Administered 2019-10-22: 20 mg via ORAL
  Filled 2019-10-22: qty 1

## 2019-10-22 NOTE — Evaluation (Signed)
Clinical/Bedside Swallow Evaluation Patient Details  Name: Jordan [name] MRN: [id number] Date of Birth: 03-09-1920  Today's Date: 10/22/2019 Time: SLP Start Time (ACUTE ONLY): 1135 SLP Stop Time (ACUTE ONLY): 1240 SLP Time Calculation (min) (ACUTE ONLY): 65 min  Past Medical History:  Past Medical History:  Diagnosis Date  . Acute hip pain, left 06/29/2018  . Acute pancreatitis 04/05/2018  . AKI (acute kidney injury) ([place])   . Alzheimer's dementia [facility]) 07/30/2013   04/17/14 MoCA - Blind (Spanish version); 9 out of 22.  Correct MoCA = 12 out of 30.    . Arthritis   . Atherosclerosis of aorta ([place]) 10/05/2015  . Back pain 10/05/2015  . Bronchiectasis ([employer]) 10/03/2017  . Calcification of native coronary artery 10/05/2015   Chest CT 2011.   . Carpal tunnel syndrome 06/29/2006   Qualifier: Diagnosis of  By: Benna Dunks    . Cervicalgia 05/18/2009   Qualifier: Diagnosis of  By: Walker Kehr MD, Patrick Jupiter    . CHF (congestive heart failure) ([place])   . Chronic bilateral thoracic back pain 10/05/2015  . Chronic constipation   . Chronic diastolic heart failure ([place]) 09/01/2013  . Chronic pain syndrome 05/09/2014  . CKD (chronic kidney disease) stage 3, GFR 30-59 ml/min 09/03/2013  . Combined visual hearing impairment 11/12/2013  . Complete heart block ([place])   . Constipation   . Cryptogenic stroke ([place]) S/P  LOOP RECORDER IMPLANT   DX  01/2013  --  SYNCOPAL EPISODE --  EPISODIC  ATRIAL TACHY/ FIBULATION  AND PAUSES  . CYSTOCELE/RECTOCELE/PROLAPSE,UNSPEC. 06/29/2006   Qualifier: Diagnosis of  By: Benna Dunks    . Depressive disorder 06/29/2006       . Dilation of esophagus 06/16/2017   Barium Esophagram 06/25/17: Poor esophageal motility. Dilated esophagus with tertiary contractions.  Smooth narrowing distal esophagus similar to the prior study.  Moderate narrowing. Barium tablet passed through this area after several sips of water.  Marland Kitchen Displaced fracture of fifth metatarsal bone, right foot, subsequent encounter for fracture  with routine healing 06/05/2017  . Diverticulosis of colon   . DIVERTICULOSIS OF COLON 06/29/2006   Qualifier: Diagnosis of  By: Benna Dunks    . Dry eyes   . Dysphagia 09/15/2015  . Encounter for care of pacemaker   . Fall from bed 03/17/2016  . Fatigue   . Food impaction of esophagus   . Frequency of urination   . Gait instability   . Generalized osteoarthritis of multiple sites 06/15/2017  . Glaucoma 04/18/2014  . Glaucoma of both eyes   . Gout 09/21/2013  . Hand Heaviness 03/17/2016  . Headache   . Hearing loss of aging 05/09/2014  . HERNIA, INCISIONAL VENTRAL W/O OBST/GNGR 10/25/2006   Has been seen by surgery in the past and told that this is not dangerous and should only be treated particularly bothersome    . History of acute gouty arthritis 09/21/2013  . History of colon polyps   . History of Food impaction of esophagus   . HTN (hypertension) 06/29/2006   Isolated Systolic Hypertension - Ambulatory BP  Monitoring result 4/28-29/2015   . Hyperlipidemia 02/27/2015  . Hyperplastic colon polyp 2006   colonoscope  . Hypertension   . Hypochloremia   . Hypoproteinemia ([place]) 09/14/2018  . Hypothyroidism   . Impaired mobility and ADLs 05/30/2014  . Influenza A 05/14/2016  . Insomnia   . INTERSTITIAL CYSTITIS 09/21/2009   Qualifier: Diagnosis of  By: Sarita Haver  MD, Coralyn Mark    . Loss  of weight 04/05/2010   Qualifier: Diagnosis of  By: Walker Kehr MD, Patrick Jupiter    . Lumbar stenosis   . Macular degeneration, age related, nonexudative 04/18/2014  . Mass of soft tissue of upper arm 10/18/2018  . MASS, LUNG 04/05/2010   Qualifier: Diagnosis of  By: Walker Kehr MD, Patrick Jupiter    . Mobitz type 2 second degree heart block 08/29/2013  . Moderate dementia ([place])   . Narrowing of Distal Esophagus 06/16/2017   Barium Esophagram (06/15/17) Poor esophageal motility. Dilated esophagus with tertiary contractions.  Smooth narrowing distal esophagus similar to the prior study.  Moderate narrowing. Barium tablet passed through this  area after several sips of water.   . OA (osteoarthritis)    RIGHT SHOULDER AC JOINT  . Orthostasis 04/28/2016  . OSTEOARTHRITIS, MULTI SITES 06/29/2006   Qualifier: Diagnosis of  By: Benna Dunks    . Osteoporosis   . OSTEOPOROSIS 03/20/2008   Qualifier: Diagnosis of  By: Jerline Pain MD, Anderson Malta    . 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 Peripheral neuropathy   . PERIPHERAL NEUROPATHY 03/29/2010   Qualifier: Diagnosis of  By: Walker Kehr MD, Patrick Jupiter    . Persistent headaches 09/06/2013  . Polymyalgia rheumatica ([place]) 11/08/2013  . Presbyesophagus (senile esophageal dysmotility) 06/16/2017  . Presence of permanent cardiac pacemaker   . Rib pain on left side 10/05/2015  . Right knee pain 01/15/2014  . Right rotator cuff tear   . Rotator cuff tear arthropathy 09/11/2012   Likely related to rotator cuff tendinopathy seen on musculoskeletal ultrasound, and GH DJD.  Complete tear of right supra and infraspinatus on MRI 10/2012     . S/P shoulder replacement 03/20/2015  . Shortness of breath dyspnea   . Spinal stenosis of lumbar region 10/03/2012   Patient has significant low back pain related to spinal stenosis and DJD, DDD.  We had a long discussion about treatment options which are quite limited. She is likely not a good surgical candidate for laminectomy. I am not sure about the usefulness of epidural steroid injection. We'll plan on restarting meloxicam after a long discussion of the pros and cons and risks of chronic kidney disease.  The patient and her family feel that the benefit of good pain control is the small risk.  Additionally she will followup with her primary care provider for creatinine monitoring.  Will obtain physical therapy for TENS unit trial.  Will refer to pain management for consideration of epidural steroid injection as that worked well in the past for cervical pain Followup here as needed   .  Syncope and collapse 02/09/2013  . Urinary frequency 06/05/2015  . Urinary incontinence, mixed 06/29/2006   Qualifier: Diagnosis of  By: Benna Dunks    . UTI (urinary tract infection) 05/27/2016  . UTI (urinary tract infection) 05/2017  . Vertigo 10/18/2010  . Weakness 08/29/2013   Past Surgical History:  Past Surgical History:  Procedure Laterality Date  . CARPAL TUNNEL RELEASE Right   . ESOPHAGOGASTRODUODENOSCOPY N/A 09/15/2015   Procedure: ESOPHAGOGASTRODUODENOSCOPY (EGD);  Surgeon: Clarene Essex, MD;  Location[facility] ENDOSCOPY;  Service: Endoscopy;  Laterality: N/A;  . ESOPHAGOGASTRODUODENOSCOPY (EGD) WITH PROPOFOL N/A 10/21/2019   Procedure: ESOPHAGOGASTRODUODENOSCOPY (EGD) WITH PROPOFOL;  Surgeon: Wonda Horner, MD;  Location: [facility] ENDOSCOPY;  Service: Endoscopy;  Laterality: N/A;  . EYE SURGERY     cataract, bilat.  Randolm Idol / REPLACE / REMOVE PACEMAKER    .  LOOP RECORDER IMPLANT  02-08-13; 08-30-13   MDT LinQ implanted by Dr Lovena Le for syncope, explanted 08-30-13 after CHB identified  . LOOP RECORDER IMPLANT N/A 02/11/2013   Procedure: LOOP RECORDER IMPLANT;  Surgeon: Evans Lance, MD;  Location: [facility] C[facility];  Service: Cardiovascular;  Laterality: N/A;  . PACEMAKER INSERTION  08-30-2013   MDT [id number] pacemaker implanted by Dr Rayann Heman for CHB  . PERMANENT PACEMAKER INSERTION N/A 08/30/2013   Procedure: PERMANENT PACEMAKER INSERTION;  Surgeon: Coralyn Mark, MD;  Location: [facility] CATH LAB;  Service: Cardiovascular;  Laterality: N/A;  . REVERSE SHOULDER ARTHROPLASTY Right 03/20/2015   Procedure: RIGHT REVERSE SHOULDER ARTHROPLASTY;  Surgeon: Netta Cedars, MD;  Location: [place];  Service: Orthopedics;  Laterality: Right;  . TOTAL ABDOMINAL HYSTERECTOMY W/ BILATERAL SALPINGOOPHORECTOMY  03-15-2005   HPI:  86 yo female adm to [facility] after having difficulty swallowing large pills - resulting in pt coughing and subseqeutly having an aspiration pneumonia.  Pt is s/p barium swallow and endoscopy.  Findings include  suspected dysmotlity and possible "spasm" per daughter.  SLP evaluation ordered to provide dysphagia mitigation strategies.  Of note, pt speaks [place] but daughter speaks both [place] and [place] and advises she prefers to interpret - even after Ipad arrived to room, stating pt does not do well with those things.   Assessment / Plan / Recommendation Clinical Impression  Patient presents with no clinical indication of oropharyngeal dysphagia with clinical observation of intake including water, meat loaf, peas and carrots.  Pt takes small boluses and is slow to masticate - feeding herself with encouragement which is effective for optimal airway protection. SLP provided pt and daughter with esophageal dysmotlity compensation strategies in writing and verbally using teach back.  SlP also observed pt given medications by RN = daughter states she takes them with water at home.  Advised to have puree/applesauce, etc on hand to use if pt reports lodging.  Advised pt start all intake with liquids, consume liquids t/o meals, eat small frequent meals and consider warm liquids.  Daughter also reports pt coughs strongly at night on occasion with apparent discomfort.  Advised to consider having pt sleep with HOB elevated to see if mitigates coughing. Question if pt may have hypertonic CP given dysmotility - advised to provide pt with drink of warm water after coughing episode clear.  Daughter denies pt having GERD and is worried re: pt's nutritional status. Advised to maximize liquid nutrition between meals as this may clear esophagus better. Otherwise small frequent meals advised. No SlP follow up indicated, SLP to sign off.  Thanks for this consult. SLP Visit Diagnosis: Dysphagia, unspecified (R13.10)    Aspiration Risk  Mild aspiration risk    Diet Recommendation Dysphagia 3 (Mech soft);Thin liquid   Liquid Administration via: Cup;Straw Medication Administration: Whole meds with liquid (have puree on hand to use  if needed, start with liquid bolus first) Supervision: Patient able to self feed Compensations: Slow rate;Small sips/bites Postural Changes: Seated upright at 90 degrees;Remain upright for at least 30 minutes after po intake    Other  Recommendations Oral Care Recommendations: Oral care BID   Follow up Recommendations None      Frequency and Duration   n/a      Prognosis   n/a     Swallow Study   General Date of Onset: 10/22/19 HPI: 86 yo female adm to [facility] after having difficulty swallowing large pills - resulting in pt coughing and subseqeutly having an aspiration pneumonia.  Pt is s/p barium swallow and endoscopy.  Findings include suspected dysmotlity and possible "spasm" per daughter.  SLP evaluation ordered to provide dysphagia mitigation strategies.  Of note, pt speaks [place] but daughter speaks both [place] and [place] and advises she prefers to interpret - even after Ipad arrived to room, stating pt does not do well with those things. Diet Prior to this Study: Dysphagia 3 (soft);Thin liquids Temperature Spikes Noted: No Respiratory Status: Room air History of Recent Intubation: No Behavior/Cognition: Alert;Cooperative;Pleasant mood Oral Cavity Assessment: Within Functional Limits Oral Care Completed by SLP: No Oral Cavity - Dentition: Dentures, top;Dentures, bottom (ill fitting) Vision: Functional for self-feeding Self-Feeding Abilities: Able to feed self Patient Positioning: Upright in bed Baseline Vocal Quality: Low vocal intensity (likely due to presbyphonia) Volitional Cough: Strong (not productive to clear but very strong cough noted) Volitional Swallow: Able to elicit    Oral/Motor/Sensory Function Overall Oral Motor/Sensory Function: Generalized oral weakness (? minimal right facial asymmetry)   Ice Chips Ice chips: Not tested   Thin Liquid Thin Liquid: Within functional limits Presentation: Cup;Self Fed;Straw    Nectar Thick Nectar Thick Liquid: Not tested    Honey Thick Honey Thick Liquid: Not tested   Puree Puree: Within functional limits Presentation: Self Fed;Spoon   Solid     Solid: Within functional limits Presentation: Self Fed;Spoon      Macario Golds 10/22/2019,1:50 PM  Kathleen Lime, MS [place] Office [phone]

## 2019-10-22 NOTE — Progress Notes (Signed)
Physical Therapy Treatment Patient Details Name: Jordan [name] MRN: [id number] DOB: May 13, 1919 Today's Date: 10/22/2019    History of Present Illness 86 yo female admitted with asp pna, N/V. Recent hx (1week PTA) of R distal fib fx-non op/CAM boot. Hx of dementia, CKD, CHF, anemia, pacemaker, chronic pain, OA, Afib. Pt is Spanish Speaking.    PT Comments    Progressing slowly with mobility. Daughter present and aided with translation and mobilizing pt. Pt + daughter declined hallway ambulation so took a walk to/from bathroom. O2 96% on RA during session. Will continue to follow during hospital stay. Daughter would like to resume HHPT after discharge.     Follow Up Recommendations  Home health PT;Supervision/Assistance - 24 hour     Equipment Recommendations  None recommended by PT    Recommendations for Other Services       Precautions / Restrictions Precautions Precautions: Fall Other Brace: CAM boot R LE Restrictions Weight Bearing Restrictions: No RLE Weight Bearing: Weight bearing as tolerated    Mobility  Bed Mobility Overal bed mobility: Needs Assistance Bed Mobility: Supine to Sit     Supine to sit: Min assist     General bed mobility comments: Unable to accurately assess-family insists upon helping pt instead of allowing therapist to see what pt is able to do for herself. Daughter moved pt from supine to sitting  Transfers   Equipment used: Rolling walker (2 wheeled) Transfers: Sit to/from Stand Sit to Stand: Min assist         General transfer comment: Assist to power up, stabilize, control descent. Cues provided by daughter  Ambulation/Gait Ambulation/Gait assistance: Min assist Gait Distance (Feet): 15 Feet (x2) Assistive device: Rolling walker (2 wheeled) Gait Pattern/deviations: Step-through pattern;Decreased stride length     General Gait Details: Unsteady. At risk for further falls. Assist to stabilize pt and manage/maneuver RW throuhgout  distance. Pt has difficulty advancing R LE intermittently 2* heavy CAM boot. Pt tolerated distance well. Dyspnea 2/4. O2 96% on RA.   Stairs             Wheelchair Mobility    Modified Rankin (Stroke Patients Only)       Balance Overall balance assessment: Needs assistance;History of Falls         Standing balance support: Bilateral upper extremity supported Standing balance-Leahy Scale: Poor                              Cognition Arousal/Alertness: Awake/alert Behavior During Therapy: WFL for tasks assessed/performed Overall Cognitive Status: History of cognitive impairments - at baseline                                 General Comments: Pt appears to follow 1 step commands from daughter well      Exercises      General Comments        Pertinent Vitals/Pain Pain Assessment: Faces Faces Pain Scale: No hurt    Home Living                      Prior Function            PT Goals (current goals can now be found in the care plan section) Progress towards PT goals: Progressing toward goals    Frequency    Min 3X/week      PT  Plan Current plan remains appropriate    Co-evaluation              AM-PAC PT "6 Clicks" Mobility   Outcome Measure  Help needed turning from your back to your side while in a flat bed without using bedrails?: A Little Help needed moving from lying on your back to sitting on the side of a flat bed without using bedrails?: A Little Help needed moving to and from a bed to a chair (including a wheelchair)?: A Little Help needed standing up from a chair using your arms (e.g., wheelchair or bedside chair)?: A Little Help needed to walk in hospital room?: A Little Help needed climbing 3-5 steps with a railing? : A Little 6 Click Score: 18    End of Session Equipment Utilized During Treatment: Gait belt (CAM boot) Activity Tolerance: Patient tolerated treatment well Patient left: in  chair;with call bell/phone within reach;with family/visitor present   PT Visit Diagnosis: Unsteadiness on feet (R26.81);Muscle weakness (generalized) (M62.81);History of falling (Z91.81);Difficulty in walking, not elsewhere classified (R26.2)     Time: 3567-0141 PT Time Calculation (min) (ACUTE ONLY): 21 min  Charges:  $Gait Training: 8-22 mins                        Doreatha Massed, PT Acute Rehabilitation  Office: [phone] Pager: [phone]

## 2019-10-22 NOTE — Progress Notes (Signed)
Pharmacy Note  86 y/o F on apixaban for atrial fibrillation which was on hold for EGD. Patient was on enoxaparin with last dose 6/22 @ 1600.  O: SCr 1.08      Hgb 8.4  A: Patient w/ low weight, advanced age, and SCr > 1 in the setting of CKD.   P: Will resume apixaban tomorrow AM at PTA dosing of 2.5 mg bid. Thank you for the consult. Will sign off.   Ulice Dash, PharmD, BCPS

## 2019-10-22 NOTE — Progress Notes (Signed)
[facility] Progress Note  Patient: Jordan [name]    FIE:[id number]  DOA: 10/16/2019     Date of Service: the patient was seen and examined on 10/22/2019  Brief hospital course: Patient presents with complaints of shortness of breath and fatigue after presentation in the ER with aspiration event at home a few days ago.  Found to have aspiration pneumonia as well as esophageal dysmotility and stricture. Currently plan is monitor for improvement in symptoms.  Assessment and Plan: 1.  Acute hypoxic respiratory failure/resolved. Aspiration pneumonia. Esophageal dysfunction. Cultures so far negative for more than 48 hours. Oxygenation improving currently on room air. Still has bilateral crackles and Occasional wheezing.  No leukocytosis or fever. Still cough. Repeat chest x ray does not show any new abnormality  Completed 5 days of  IV Unasyn Stop IV hydration for now. Recommendation for incentive spirometry and flutter valve given to pt.  2.  Esophageal dysmotility. Small esophageal stricture. Prior history of the same. [facility] gastroenterology consulted. Underwent EGD, no stricture.  Likely dysmotility.  3.  Intractable nausea and vomiting. Constipation. Resolved  X-ray abdomen on the day of admission was not significant. Monitor.  Currently no further nausea or vomiting.  4.  Recent nondisplaced fracture of right distal fibula Cam boot with ambulation. Outpatient follow-up with orthopedics.  Pain well controlled.  No swelling.  5.  Paroxysmal A. fib. Accelerated hypertension Heart rate currently controlled. Patient is on Eliquis was on hold, will resume.  Blood pressure mildly elevated.  Will initiate as needed hydralazine  6.  Bicytopenia. Chronic macrocytic anemia.  Acute worsening secondary to dilution. Chronic thrombocytopenia currently stable. Likely in the setting of nutritional deficiency. Normal B12 Monitor while resuming the apixaban  7.   Hypothyroidism Continue Synthroid. Normal TSH and free T4.  8.  Depression. Adult failure to thrive. Home dose of Remeron and trazodone resumed although I feel we shuold minimize meds and trazodone will be ideal to reduce from the list if possible.   9.  History of gout CrCl 14.1. Per pharmacy colchicine and allopurinol currently both on hold.  10.  Chronic renal disease stage IIIa. Mild worsening. GFR less than 50. CrCl 14. Gentle IV hydration was given. Monitor.  11.  Elevated BNP. Chronic diastolic dysfunction. Give Lasix as needed. One dose 10/22/2019  Echocardiogram performed this admission shows preserved EF without any significant valvular or wall motion abnormality.  12.  Hypoglycemia. Treated with  D5. Was on CBG every 4 hours.  13.  Language barrier. Patient needs Spanish interpreter although unable to use video interpreter successfully due to hearing difficulty. Family helps to interpret and prefers to use them as opposed to any interpreter.  Diet: Dysphagia 3 (Mech soft);Thin liquid  DVT Prophylaxis: lovenox, now will add back apixaban  Advance goals of care discussion: DNR  Family Communication: family was present at bedside, at the time of interview.  Opportunity was given to ask question and all questions were answered satisfactorily.   Disposition:  Status is: Inpatient  Remains inpatient appropriate because:Altered mental status and Inpatient level of care appropriate due to severity of illness   Dispo: The patient is from: Home              Anticipated d/c is to: Home              Anticipated d/c date is: 1 day              Patient currently is not medically stable to d/c.  Subjective: reported PND and orthopnea for the whole day, no chest pain no nausea or vomiting. No fever or chills.   Physical Exam:  General: Appear in moderate distress, no Rash; Oral Mucosa Clear, dry. no Abnormal Neck Mass Or lumps, Conjunctiva normal    Cardiovascular: S1 and S2 Present, aortic systolic  Murmur, Respiratory: increased respiratory effort, Bilateral Air entry present and bilateral  Crackles, no wheezes Abdomen: Bowel Sound present, Soft and no tenderness Extremities: no Pedal edema, no calf tenderness Neurology: alert and oriented to place and person affect anxious. no new focal deficit Gait not checked due to patient safety concerns  Vitals:   10/22/19 0640 10/22/19 0715 10/22/19 0813 10/22/19 1349  BP:  (!) 145/78 (!) 154/49 (!) 164/48  Pulse: 79 (!) 112 72 71  Resp: 20 18 18 19   Temp: 98.7 F (37.1 C) 98.5 F (36.9 C) 97.7 F (36.5 C) 98.5 F (36.9 C)  TempSrc:  Oral Oral Oral  SpO2: 100% 97% 98% 98%  Weight:      Height:        Intake/Output Summary (Last 24 hours) at 10/22/2019 1916 Last data filed at 10/22/2019 1300 Gross per 24 hour  Intake 240 ml  Output 85 ml  Net 155 ml   Filed Weights   10/17/19 0600  Weight: 47.2 kg    Data Reviewed: I have personally reviewed and interpreted daily labs, tele strips, imagings as discussed above. I reviewed all nursing notes, pharmacy notes, vitals, pertinent old records I have discussed plan of care as described above with RN and patient/family.  CBC: Recent Labs  Lab 10/16/19 1800 10/18/19 0356 10/19/19 0350 10/19/19 0824 10/20/19 0608 10/21/19 0400 10/22/19 0318  WBC 8.7   < > 4.7 5.0 5.9 4.6 5.0  NEUTROABS 7.4  --   --  3.5  --   --   --   HGB 9.9*   < > 8.4* 9.6* 9.1* 8.3* 8.4*  HCT 30.0*   < > 26.1* 29.5* 27.9* 26.0* 25.8*  MCV 101.4*   < > 102.8* 102.8* 101.8* 104.0* 104.0*  PLT 97*   < > 106* 128* 118* 101* 95*   < > = values in this interval not displayed.   Basic Metabolic Panel: Recent Labs  Lab 10/18/19 0356 10/19/19 0350 10/20/19 0608 10/21/19 0400 10/22/19 0318  NA 133* 133* 135 140 141  K 4.1 4.2 4.1 4.1 3.7  CL 100 99 105 106 107  CO2 27 25 27 29 31   GLUCOSE 67* 60* 119* 111* 93  BUN 28* 33* 32* 31* 33*  CREATININE  1.23* 1.28* 1.22* 1.12* 1.08*  CALCIUM 8.1* 8.1* 8.3* 8.3* 8.5*  MG 1.9  --   --   --   --     Studies: No results found.  Scheduled Meds: . enoxaparin (LOVENOX) injection  30 mg Subcutaneous Q24H  . feeding supplement (NEPRO CARB STEADY)  237 mL Oral BID BM  . feeding supplement (PRO-STAT SUGAR FREE 64)  30 mL Oral BID  . fesoterodine  4 mg Oral Daily  . gabapentin  100 mg Oral BID  . guaiFENesin  10 mL Oral QID  . levothyroxine  100 mcg Oral Q0600  . metoprolol succinate  25 mg Oral Daily  . mirtazapine  15 mg Oral QHS  . pantoprazole (PROTONIX) IV  40 mg Intravenous Q24H  . traZODone  50 mg Oral QHS   Continuous Infusions: PRN Meds: acetaminophen, albuterol, hydrALAZINE, ondansetron (ZOFRAN) IV, traMADol  Time spent:  35 minutes  Author: Berle Mull, MD [facility] 10/22/2019 7:16 PM  To reach On-call, see care teams to locate the attending and reach out via [email or web address]. Between 7PM-7AM, please contact night-coverage If you still have difficulty reaching the attending provider, please page the [facility] (Director on Call) for [facility] on amion for assistance.

## 2019-10-23 LAB — CBC WITH DIFFERENTIAL/PLATELET
Abs Immature Granulocytes: 0.05 10*3/uL (ref 0.00–0.07)
Basophils Absolute: 0 10*3/uL (ref 0.0–0.1)
Basophils Relative: 0 %
Eosinophils Absolute: 0.1 10*3/uL (ref 0.0–0.5)
Eosinophils Relative: 2 %
HCT: 26.3 % — ABNORMAL LOW (ref 36.0–46.0)
Hemoglobin: 8.7 g/dL — ABNORMAL LOW (ref 12.0–15.0)
Immature Granulocytes: 1 %
Lymphocytes Relative: 33 %
Lymphs Abs: 1.6 10*3/uL (ref 0.7–4.0)
MCH: 33.7 pg (ref 26.0–34.0)
MCHC: 33.1 g/dL (ref 30.0–36.0)
MCV: 101.9 fL — ABNORMAL HIGH (ref 80.0–100.0)
Monocytes Absolute: 0.4 10*3/uL (ref 0.1–1.0)
Monocytes Relative: 8 %
Neutro Abs: 2.8 10*3/uL (ref 1.7–7.7)
Neutrophils Relative %: 56 %
Platelets: 87 10*3/uL — ABNORMAL LOW (ref 150–400)
RBC: 2.58 MIL/uL — ABNORMAL LOW (ref 3.87–5.11)
RDW: 16.8 % — ABNORMAL HIGH (ref 11.5–15.5)
WBC: 4.9 10*3/uL (ref 4.0–10.5)
nRBC: 0 % (ref 0.0–0.2)

## 2019-10-23 LAB — COMPREHENSIVE METABOLIC PANEL
ALT: 16 U/L (ref 0–44)
AST: 23 U/L (ref 15–41)
Albumin: 2.2 g/dL — ABNORMAL LOW (ref 3.5–5.0)
Alkaline Phosphatase: 95 U/L (ref 38–126)
Anion gap: 9 (ref 5–15)
BUN: 32 mg/dL — ABNORMAL HIGH (ref 8–23)
CO2: 27 mmol/L (ref 22–32)
Calcium: 8.2 mg/dL — ABNORMAL LOW (ref 8.9–10.3)
Chloride: 102 mmol/L (ref 98–111)
Creatinine, Ser: 1.18 mg/dL — ABNORMAL HIGH (ref 0.44–1.00)
GFR calc Af Amer: 44 mL/min — ABNORMAL LOW (ref >60)
GFR calc non Af Amer: 38 mL/min — ABNORMAL LOW (ref >60)
Glucose, Bld: 99 mg/dL (ref 70–99)
Potassium: 3.6 mmol/L (ref 3.5–5.1)
Sodium: 138 mmol/L (ref 135–145)
Total Bilirubin: 0.8 mg/dL (ref 0.3–1.2)
Total Protein: 4.8 g/dL — ABNORMAL LOW (ref 6.5–8.1)

## 2019-10-23 LAB — MAGNESIUM: Magnesium: 1.8 mg/dL (ref 1.7–2.4)

## 2019-10-23 MED ORDER — PRO-STAT SUGAR FREE PO LIQD: 30.0000 mL | Freq: Two times a day (BID) | ORAL | 0 refills | Status: DC

## 2019-10-23 MED ORDER — NEPRO/CARBSTEADY PO LIQD: 237.0000 mL | Freq: Two times a day (BID) | ORAL | 0 refills | Status: AC

## 2019-10-23 MED ORDER — METOPROLOL SUCCINATE ER 25 MG PO TB24: 25.0000 mg | ORAL_TABLET | Freq: Every day | ORAL | 3 refills | Status: DC | PRN

## 2019-10-23 MED ORDER — DOCUSATE SODIUM 100 MG PO CAPS: 200.0000 mg | ORAL_CAPSULE | Freq: Two times a day (BID) | ORAL | Status: AC | PRN

## 2019-10-23 MED ORDER — POLYETHYLENE GLYCOL 3350 17 G PO PACK: 17.0000 g | PACK | Freq: Every day | ORAL | Status: AC | PRN

## 2019-10-23 NOTE — Discharge Instructions (Signed)
Cmo utilizar un espirmetro de incentivo How to Use an Public librarian de incentivo es un dispositivo que mide qu tan bien puede llenar los pulmones con cada respiracin. Aprender a respirar hondo y profundamente con esta herramienta puede ayudarlo a mantener los pulmones limpios y [place]. El espirmetro puede ayudar a Human resources officer o a Armed forces technical officer probabilidad de Engineering geologist respiratorios (pulmonares), especialmente infecciones. Se le puede pedir que utilice un espirmetro:  [place]pus de [place].  Si tiene un problema pulmonar o antecedentes de tabaquismo.  Luego de [employer] por un perodo prolongado sin poder moverse ni Dietitian. Si el espirmetro incluye un indicador para Biomedical scientist mayor nmero que ha [place], [place] mdico o terapeuta especialista en vas respiratorias lo ayudar a Engineer, structural. Mantenga una lista Primary school teacher) del progreso segn se lo haya indicado el mdico. Cules son los riesgos?  Respirar demasiado rpido Sales executive o que se [place]. Tmese su tiempo para no marearse o sentir vahdos.  Si siente dolor, puede ser necesario que tome analgsicos antes de usar el espirmetro de incentivo. Es ms difcil respirar hondo si tiene Social research officer, government. Cmo utilizar su espirmetro de incentivo   1. Sintese en el borde de la cama o sobre una silla. 2. Sostenga el espirmetro de incentivo, de modo que est en posicin vertical. 3. Antes de usar el espirmetro, respire con normalidad. 4. Coloque la boquilla en la boca. Asegrese de que sus labios estn cerrados hermticamente alrededor de la boquilla. 5. Respire por la boca con lentitud y tan profundamente como sea posible, para que el pistn o la bola se eleve hacia la parte superior de la cmara. 6. Mantenga la respiracin durante 3 a 5 segundos, o por [employer] sea posible. ? Si el espirmetro incluye un indicador, utilcelo para que gue su respiracin. Respire ms  lentamente si el indicador sobrepasa las reas marcadas. 7. Qutese la boquilla de la boca y respire con normalidad. El pistn o la bola volvern a la parte inferior de la cmara. 8. Descanse durante unos segundos, luego repita los pasos 10 veces o ms. ? Tmese su tiempo y respire con normalidad entre las respiraciones profundas para no marearse o sentir vahdos. ? Hgalo cada 1 o 2 horas, cuando est despierto. 9. Si el espirmetro incluye un marcador de objetivo para mostrar el mayor nmero que ha alcanzado (mejor esfuerzo), use esto como un objetivo para tratar de Science writer durante cada repeticin. 10. Despus de cada sesin de 10 respiraciones profundas, tosa un par de veces. Esto ayudar a asegurarse de que sus pulmones estn limpios. ? Si tiene una incisin de [place] en el pecho o el abdomen, coloque una almohada o una toalla enrollada firmemente contra la incisin al toser. Esto puede ayudar a Dietitian al toser. Consejos generales  Cuando pueda salir de la cama, camine con frecuencia y contine tosiendo para ayudar a [employer].  Siga usando el espirmetro de incentivo hasta que el mdico le diga que est bien dejar de usarlo. Si estuvo internado, es posible que le hayan indicado que contine con el uso del espirmetro en su casa. Comunquese con un mdico si:  Tiene dificultad para Therapist, music.  Tiene dificultad para Therapist, music con la frecuencia que le han indicado.  Los analgsicos no Secondary school teacher lo suficiente como para usar el espirmetro segn lo indicado.  Tiene fiebre.  Le falta el aire. Solicite ayuda de inmediato si:  Escupe mucosidad  con sangre de los pulmones (esputo con [place]) al toser.  Le sale lquido o sangre de una incisin despus de toser. Resumen  El espirmetro incentivo es un instrumento que puede ayudarlo a aprender a Ambulance person hondo y profundamente para Theatre manager los pulmones limpios y [place].  Se le puede  pedir que use un espirmetro despus de [place], si tiene un problema pulmonar o antecedentes de tabaquismo, o si usted ha [employer] un largo perodo de [place].  Utilice su espirmetro de incentivo segn las instrucciones cada 1 o 2 horas mientras est despierto.  Si tiene una incisin en el pecho o el abdomen, coloque una almohada o una toalla enrollada firmemente contra la incisin al toser. Esto ayudar a Dietitian. Esta informacin no tiene Marine scientist el consejo del mdico. Asegrese de hacerle al mdico cualquier pregunta que tenga. Document Revised: 01/02/2019 Document Reviewed: 01/02/2019 Elsevier Patient Education  [place].

## 2019-10-23 NOTE — Progress Notes (Signed)
Family at the bedside feeding patient, states they will be ready to leave shortly. Discharge paperwork reviewed with pt & her dtr.

## 2019-10-23 NOTE — Discharge Summary (Signed)
Physician Discharge Summary  Jordan [name] PIR:[id number] DOB: 06-21-1919 DOA: 10/16/2019  PCP: McDiarmid, Blane Ohara, MD  Admit date: 10/16/2019 Discharge date: 10/23/2019  Recommendations for Outpatient Follow-up:   Dysphagia unspecified  Recent nondisplaced fracture right distal fibula  Chronic microcytic anemia, chronic thrombocytopenia   Follow-up Information    McDiarmid, Blane Ohara, MD. Schedule an appointment as soon as possible for a visit in 1 week(s).   Specialty: Family Medicine Contact information: [place] [address] [phone]                Discharge Diagnoses: Principal diagnosis is #1 1. Acute hypoxic respiratory failure secondary to aspiration pneumonia  2. Esophageal dysmotility 3. Intractable nausea and vomiting, constipation. 4. Dysphagia unspecified 5. Recent nondisplaced fracture right distal fibula 6. Chronic microcytic anemia, chronic thrombocytopenia 7. CKD stage IIIb 8. Chronic diastolic CHF  Discharge Condition: improved Disposition: home, resume home health PT, RN  Diet recommendation:   Dysphagia 3 (Mech soft);Thin liquid  Liquid Administration via: Cup;Straw Medication Administration: Whole meds with liquid (have puree on hand to use if needed, start with liquid bolus first) Supervision: Patient able to self feed Compensations: Slow rate;Small sips/bites Postural Changes: Seated upright at 90 degrees;Remain upright for at least 30 minutes after po intake  Filed Weights   10/17/19 0600  Weight: 47.2 kg    History of present illness: 86 year old woman presented with shortness of breath after aspiration event at home.  Admitted for aspiration pneumonia.   Hospital Course:  Patient was was treated with antibiotics with gradual clinical improvement, resolution of hypoxia.  Because of aspiration was seen both by speech therapy with recommendations as below as well as gastroenterology.  Underwent EGD with  findings as below.  Condition gradually improved, hospitalization was uncomplicated.  Individual issues as below.  Acute hypoxic respiratory failure secondary to aspiration pneumonia (outpatient pill choking episode). --Hypoxia resolved.  Completed 5 days Unasyn.  Esophageal dysmotility, small esophageal stricture was considered.  Seen by [facility] gastroenterology.  Underwent EGD, normal esophagus, normal examined duodenum.  No stricture.  GI felt dysphagia secondary to esophageal dysmotility.  Intractable nausea and vomiting, constipation. --Resolved.  Dysphagia unspecified.  Mild aspiration risk per speech therapy. Dysphagia 3 (Mech soft);Thin liquid  Liquid Administration via: Cup;Straw Medication Administration: Whole meds with liquid (have puree on hand to use if needed, start with liquid bolus first) Supervision: Patient able to self feed Compensations: Slow rate;Small sips/bites Postural Changes: Seated upright at 90 degrees;Remain upright for at least 30 minutes after po intake  Recent nondisplaced fracture right distal fibula.  Continue cam boot with ambulation.  Outpatient follow-up with orthopedics.  Paroxysmal atrial fibrillation.  Heart rate controlled. --Continue apixaban  Chronic microcytic anemia, chronic thrombocytopenia, likely nutritional in etiology. --Appears stable.  Thrombocytopenia dates back to 2017.  Intermittent in nature.  Hypothyroidism --Continue Synthroid  CKD stage IIIb -- stable   Chronic diastolic CHF.  Echocardiogram this admission showed preserved LVEF. --trace pedal edema only, appears euvolemic.  Language barrier patient needs Spanish interpreter, unable to use video interpreter successfully due to hearing difficulty.  Family helps to interpret and prefers to use family opposed to other interpreters.  Today's assessment: S: daughter at bedside, per preference of family, daughter acts as interpreter and difficulty patient has  hearing. She reports the patient is feeling better today, seems to be breathing fine.  Tolerating diet.  No choking episodes. O: Vitals:  Vitals:   10/23/19 1300 10/23/19 1559  BP: (!) 122/40 Marland Kitchen)  148/48  Pulse: 64   Resp: 15   Temp: 98.8 F (37.1 C)   SpO2: 98%     Constitutional:  . Appears calm and comfortable sitting in chair Respiratory:  . Few crackles bilateral posterior bases.  Otherwise clear to auscultation. Marland Kitchen Respiratory effort normal.  Cardiovascular:  . RRR, no m/r/g . Trace pedal edema Psychiatric:  . Mental status o Mood, affect appropriate  BMP unremarkable.  Creatinine appears to be close to baseline.  Magnesium within normal limits. Hemoglobin stable at 8.7.  WBC within normal limits.  Platelets stable at 87.  Discharge Instructions  Discharge Instructions    Discharge instructions   Complete by: As directed    Call your physician or seek immediate medical attention for fever, shortness of breath or worsening of condition. Dysphagia 3 (Mech anically soft diet);Thin liquid  Liquid Administration via: Cup;Straw Medication Administration: Whole medications with liquid (have puree on hand to use if needed, start with liquid first) Supervision: Patient able to self feed Compensations: Slow rate;Small sips/bites Postural Changes: Seated upright at 90 degrees;Remain upright for at least 30 minutes after po intake   Increase activity slowly   Complete by: As directed      Allergies as of 10/23/2019      Reactions   Chlorthalidone Other (See Comments)   Hyponatremia   Honey Bee Treatment [bee Venom] Swelling, Rash   Verapamil Other (See Comments)   Bradycardia - HR  35-45 on 40mg  TID   Adhesive [tape] Rash, Other (See Comments)   Skin irritation   Amlodipine Other (See Comments)   Peripheral edema with 2.5mg  daily dose      Medication List    STOP taking these medications   cephALEXin 500 MG capsule Commonly known as: KEFLEX   lidocaine 2 %  solution Commonly known as: XYLOCAINE     TAKE these medications   acetaminophen 650 MG CR tablet Commonly known as: TYLENOL Take 650 mg by mouth 2 (two) times daily as needed for pain.   Allevyn Gentle Border Heel Pads Apply 2 Units topically once a week.   allopurinol 100 MG tablet Commonly known as: ZYLOPRIM TAKE 1 TABLET(100 MG) BY MOUTH DAILY What changed: See the new instructions.   beta carotene w/minerals tablet Take 1 tablet by mouth daily.   colchicine 0.6 MG tablet TAKE 1 TABLET BY MOUTH TWICE DAILY   cycloSPORINE 0.05 % ophthalmic emulsion Commonly known as: RESTASIS Place 1 drop into both eyes at bedtime.   diclofenac Sodium 1 % Gel Commonly known as: VOLTAREN Apply 2 g topically 4 (four) times daily.   docusate sodium 100 MG capsule Commonly known as: COLACE Take 2 capsules (200 mg total) by mouth 2 (two) times daily as needed for mild constipation.   Eliquis 2.5 MG Tabs tablet Generic drug: apixaban TAKE 1 TABLET(2.5 MG) BY MOUTH TWICE DAILY What changed: See the new instructions.   feeding supplement (NEPRO CARB STEADY) Liqd Take 237 mLs by mouth 2 (two) times daily between meals. Start taking on: October 24, 2019   feeding supplement (PRO-STAT SUGAR FREE 64) Liqd Take 30 mLs by mouth 2 (two) times daily.   fesoterodine 4 MG Tb24 tablet Commonly known as: Toviaz Take 1 tablet (4 mg total) by mouth daily.   furosemide 20 MG tablet Commonly known as: LASIX TAKE 1 TABLET BY MOUTH DAILY AS NEEDED FOR SWELLING What changed: See the new instructions.   gabapentin 100 MG capsule Commonly known as: NEURONTIN TAKE 2 CAPSULES BY  MOUTH TWICE DAILY   levothyroxine 100 MCG tablet Commonly known as: SYNTHROID TAKE 1 TABLET BY MOUTH ONCE DAILY   metoprolol succinate 25 MG 24 hr tablet Commonly known as: TOPROL-XL Take 1 tablet (25 mg total) by mouth daily as needed (high blood pressure). What changed: See the new instructions.   mirtazapine 15 MG  tablet Commonly known as: REMERON Take 1 tablet (15 mg total) by mouth at bedtime.   omeprazole 20 MG capsule Commonly known as: PRILOSEC TAKE 1 CAPSULE(20 MG) BY MOUTH DAILY What changed: See the new instructions.   ondansetron 4 MG disintegrating tablet Commonly known as: Zofran ODT Take 1 tablet (4 mg total) by mouth every 8 (eight) hours as needed for nausea or vomiting.   polyethylene glycol 17 g packet Commonly known as: MIRALAX / GLYCOLAX Take 17 g by mouth daily as needed for moderate constipation. take 17 grams by mouth once daily   traMADol 50 MG tablet Commonly known as: ULTRAM TAKE 1/2 TO 1 TABLET(25 TO 50 MG) BY MOUTH EVERY 8 HOURS AS NEEDED FOR MODERATE PAIN What changed: See the new instructions.   Travatan Z 0.004 % Soln ophthalmic solution Generic drug: Travoprost (BAK Free) Place 1 drop into both eyes at bedtime.   traZODone 50 MG tablet Commonly known as: DESYREL TAKE 1 TABLET BY MOUTH AT BEDTIME      Allergies  Allergen Reactions  . Chlorthalidone Other (See Comments)    Hyponatremia  . Honey Bee Treatment [Bee Venom] Swelling and Rash  . Verapamil Other (See Comments)    Bradycardia - HR  35-45 on 40mg  TID  . Adhesive [Tape] Rash and Other (See Comments)    Skin irritation  . Amlodipine Other (See Comments)    Peripheral edema with 2.5mg  daily dose    The results of significant diagnostics from this hospitalization (including imaging, microbiology, ancillary and laboratory) are listed below for reference.    Significant Diagnostic Studies: DG CHEST PORT 1 VIEW  Result Date: 10/20/2019 CLINICAL DATA:  Patient to have EGD tomorrow. EXAM: PORTABLE CHEST 1 VIEW COMPARISON:  October 17, 2019 FINDINGS: The heart, hila, and mediastinum are normal. No pneumothorax. Stable pacemaker. Mild opacity at the right base is consistent with overlapping chondral calcifications and vascular crowding. No suspicious infiltrates are identified. IMPRESSION: No acute  abnormalities. Electronically Signed   By: Dorise Bullion III M.D   On: 10/20/2019 13:47   DG CHEST PORT 1 VIEW  Result Date: 10/17/2019 CLINICAL DATA:  Choking EXAM: PORTABLE CHEST 1 VIEW COMPARISON:  Chest radiograph from one day prior. FINDINGS: Right total shoulder arthroplasty. Stable configuration of 2 lead left subclavian pacemaker. Stable cardiomediastinal silhouette with mild cardiomegaly. No pneumothorax. No pleural effusion. Patchy medial right lung base opacity with suggestion of associated bronchiectasis, not appreciably changed. No overt pulmonary edema. IMPRESSION: 1. No appreciable change in patchy medial right lung base opacity with suggestion of underlying bronchiectasis, which could represent atelectasis, aspiration or pneumonia. 2. Stable mild cardiomegaly without overt pulmonary edema. Electronically Signed   By: Ilona Sorrel M.D.   On: 10/17/2019 11:19   DG Chest Port 1 View  Result Date: 10/16/2019 CLINICAL DATA:  Cough with hypoxia. EXAM: PORTABLE CHEST 1 VIEW COMPARISON:  Radiographs 10/14/2019 and 04/08/2018.  CT 04/08/2018. FINDINGS: 1827 hours. Left subclavian pacemaker leads appear unchanged. The heart size and mediastinal contours are stable with aortic atherosclerosis. There are persistent low lung volumes with increased patchy opacity medially at the right lung base. The left lung is  clear. No pneumothorax or significant pleural effusion. Previous right shoulder reverse arthroplasty without acute osseous findings. IMPRESSION: Persistent low lung volumes with increased patchy opacity medially at the right lung base suspicious for atelectasis or developing infiltrate. Electronically Signed   By: Richardean Sale M.D.   On: 10/16/2019 18:52   DG Abd Portable 1V  Result Date: 10/19/2019 CLINICAL DATA:  Constipation. EXAM: PORTABLE ABDOMEN - 1 VIEW COMPARISON:  10/17/2019 FINDINGS: Bowel gas pattern is nonobstructive. There is barium over the right colon and transverse colon  due to patient's barium swallow yesterday. No evidence of free peritoneal air. Findings suggesting diverticulosis over the rectosigmoid colon. Severe degenerate changes of the spine with curvature of the lumbar spine convex left. Mild degenerate changes of the hips. IMPRESSION: Nonobstructive bowel gas pattern. Residual barium over the right and transverse colon. Mild colonic diverticulosis. Electronically Signed   By: Marin Olp M.D.   On: 10/19/2019 11:36   DG Abd Portable 1V  Result Date: 10/17/2019 CLINICAL DATA:  Choking, chronic constipation EXAM: PORTABLE ABDOMEN - 1 VIEW COMPARISON:  None. FINDINGS: No disproportionately dilated small bowel loops. Mild colonic stool. No evidence of pneumatosis or pneumoperitoneum. No radiopaque nephrolithiasis. Marked lumbar spondylosis. IMPRESSION: Nonobstructive bowel gas pattern. Mild colonic stool. Electronically Signed   By: Ilona Sorrel M.D.   On: 10/17/2019 11:20   ECHOCARDIOGRAM COMPLETE  Result Date: 10/18/2019    ECHOCARDIOGRAM REPORT   Patient Name:   CAELEN HIGINBOTHAM Brooke Date of Exam: 10/18/2019 Medical Rec #:  [id number]  Height:       53.0 in Accession #:    [id number] Weight:       104.1 lb Date of Birth:  [birth year]  BSA:          1.292 m Patient Age:    66 years   BP:           138/49 mmHg Patient Gender: F          HR:           77 bpm. Exam Location:  Inpatient Procedure: 2D Echo Indications:    CHF 428  History:        Patient has prior history of Echocardiogram examinations, most                 recent 07/29/2014. CHF, Pacemaker; Risk Factors:Hypertension and                 Dyslipidemia. Heart block. loop recorder implant.  Sonographer:    Jannett Celestine RDCS (AE) Referring Phys: [id number] [place]  1. Left ventricular ejection fraction, by estimation, is 60 to 65%. The left ventricle has normal function. The left ventricle has no regional wall motion abnormalities. Indeterminate diastolic filling due to E-A fusion.  2. Right ventricular  systolic function is normal. The right ventricular size is normal.  3. The mitral valve is normal in structure. No evidence of mitral valve regurgitation. No evidence of mitral stenosis.  4. The aortic valve was not well visualized. Aortic valve regurgitation is not visualized. No aortic stenosis is present. Comparison(s): Prior images unable to be directly viewed, comparison made by report only. FINDINGS  Left Ventricle: Left ventricular ejection fraction, by estimation, is 60 to 65%. The left ventricle has normal function. The left ventricle has no regional wall motion abnormalities. The left ventricular internal cavity size was normal in size. There is  no left ventricular hypertrophy. Indeterminate diastolic filling due to E-A fusion. Right Ventricle: The right  ventricular size is normal. No increase in right ventricular wall thickness. Right ventricular systolic function is normal. Left Atrium: Left atrial size was normal in size. Right Atrium: Right atrial size was normal in size. Pericardium: There is no evidence of pericardial effusion. Mitral Valve: The mitral valve is normal in structure. Mild mitral annular calcification. No evidence of mitral valve regurgitation. No evidence of mitral valve stenosis. Tricuspid Valve: The tricuspid valve is normal in structure. Tricuspid valve regurgitation is not demonstrated. Aortic Valve: The aortic valve was not well visualized. Aortic valve regurgitation is not visualized. No aortic stenosis is present. Pulmonic Valve: The pulmonic valve was not well visualized. Pulmonic valve regurgitation is not visualized. Aorta: The aortic root was not well visualized. IAS/Shunts: No atrial level shunt detected by color flow Doppler. Additional Comments: A pacer wire is visualized.  LEFT VENTRICLE PLAX 2D LVIDd:         4.50 cm LVIDs:         3.10 cm LV PW:         1.10 cm LV IVS:        1.20 cm  LEFT ATRIUM           Index LA diam:      3.40 cm 2.63 cm/m LA Vol (A2C): 40.0 ml  30.95 ml/m  AORTIC VALVE LVOT Vmax:   129.00 cm/s LVOT Vmean:  91.300 cm/s LVOT VTI:    0.263 m  AORTA Ao Root diam: 2.90 cm  SHUNTS Systemic VTI: 0.26 m Dani Gobble Croitoru MD Electronically signed by Sanda Klein MD Signature Date/Time: 10/18/2019/3:11:54 PM    Final    DG ESOPHAGUS W SINGLE CM (SOL OR THIN BA)  Result Date: 10/18/2019 CLINICAL DATA:  Odynophagia and dysphagia.  Recent aspiration. EXAM: ESOPHOGRAM/BARIUM SWALLOW TECHNIQUE: Single contrast examination was performed using  thin barium. FLUOROSCOPY TIME:  Fluoroscopy Time:  1 minutes 54 seconds Radiation Exposure Index (if provided by the fluoroscopic device): 16.6 mGy Number of Acquired Spot Images: 0 COMPARISON:  None. FINDINGS: Single contrast esophagram was performed with patient in semi erect position due to her immobility and inability to stand because of a broken leg. A moderate smoothly tapered stricture is seen at the gastroesophageal junction. No other soft esophageal strictures are seen. Severe esophageal dysmotility is seen with break up of primary peristalsis in the proximal stomach and intermittent tertiary contractions causing esophageal stasis. No gastroesophageal reflux was seen during the exam. No evidence of hiatal hernia. IMPRESSION: Severe esophageal dysmotility. Moderate smoothly-tapered stricture at the gastroesophageal junction. No evidence of hiatal hernia.  No gastroesophageal reflux seen. Electronically Signed   By: Marlaine Hind M.D.   On: 10/18/2019 10:26    Microbiology: Recent Results (from the past 240 hour(s))  Blood culture (routine x 2)     Status: None   Collection Time: 10/16/19  6:10 PM   Specimen: BLOOD LEFT FOREARM  Result Value Ref Range Status   Specimen Description   Final    BLOOD LEFT FOREARM Performed at [place] [address] [place], [place] [address]    Special Requests   Final    BOTTLES DRAWN AEROBIC AND ANAEROBIC Blood Culture results may not be optimal due to  an excessive volume of blood received in culture bottles Performed at [place] [address] [place], [place] [address]    Culture   Final    NO GROWTH 5 DAYS Performed at [facility], [place],  [place] [address]    Report Status 10/21/2019 FINAL  Final  Blood culture (routine x 2)     Status: None   Collection Time: 10/16/19  6:20 PM   Specimen: BLOOD RIGHT FOREARM  Result Value Ref Range Status   Specimen Description   Final    BLOOD RIGHT FOREARM Performed at [place] [address], [place], [place] [address]    Special Requests   Final    BOTTLES DRAWN AEROBIC AND ANAEROBIC Blood Culture results may not be optimal due to an excessive volume of blood received in culture bottles Performed at [place] [address], [place], [place] [address]    Culture   Final    NO GROWTH 5 DAYS Performed at [facility], [place] [address], [place], [place] [address]    Report Status 10/21/2019 FINAL  Final  SARS Coronavirus 2 by RT PCR (hospital order, performed in [facility] hospital lab) Nasopharyngeal Nasopharyngeal Swab     Status: None   Collection Time: 10/16/19  6:30 PM   Specimen: Nasopharyngeal Swab  Result Value Ref Range Status   SARS Coronavirus 2 NEGATIVE NEGATIVE Final    Comment: (NOTE) SARS-CoV-2 target nucleic acids are NOT DETECTED.  The SARS-CoV-2 RNA is generally detectable in upper and lower respiratory specimens during the acute phase of infection. The lowest concentration of SARS-CoV-2 viral copies this assay can detect is 250 copies / mL. A negative result does not preclude SARS-CoV-2 infection and should not be used as the sole basis for treatment or other patient management decisions.  A negative result may occur with improper specimen collection / handling, submission of specimen other than nasopharyngeal swab, presence of viral mutation(s) within the areas  targeted by this assay, and inadequate number of viral copies (<250 copies / mL). A negative result must be combined with clinical observations, patient history, and epidemiological information.  Fact Sheet for Patients:   StrictlyIdeas.no  Fact Sheet for Healthcare Providers: BankingDealers.co.za  This test is not yet approved or  cleared by the [place] FDA and has been authorized for detection and/or diagnosis of SARS-CoV-2 by FDA under an Emergency Use Authorization (EUA).  This EUA will remain in effect (meaning this test can be used) for the duration of the COVID-19 declaration under Section 564(b)(1) of the Act, 21 U.S.C. section 360bbb-3(b)(1), unless the authorization is terminated or revoked sooner.  Performed at [facility], [place] [address], [place], [place] [address]      Labs: Basic Metabolic Panel: Recent Labs  Lab 10/18/19 0356 10/18/19 0356 10/19/19 0350 10/20/19 0608 10/21/19 0400 10/22/19 0318 10/23/19 0328  NA 133*   < > 133* 135 140 141 138  K 4.1   < > 4.2 4.1 4.1 3.7 3.6  CL 100   < > 99 105 106 107 102  CO2 27   < > 25 27 29 31 27   GLUCOSE 67*   < > 60* 119* 111* 93 99  BUN 28*   < > 33* 32* 31* 33* 32*  CREATININE 1.23*   < > 1.28* 1.22* 1.12* 1.08* 1.18*  CALCIUM 8.1*   < > 8.1* 8.3* 8.3* 8.5* 8.2*  MG 1.9  --   --   --   --   --  1.8   < > = values in this interval not displayed.   Liver Function Tests: Recent Labs  Lab 10/16/19 1800 10/23/19 0328  AST 37 23  ALT 27 16  ALKPHOS 177* 95  BILITOT 1.0 0.8  PROT 5.9* 4.8*  ALBUMIN 3.0* 2.2*   CBC: Recent Labs  Lab 10/16/19 1800 10/18/19 0356 10/19/19 0824 10/20/19 0608 10/21/19 0400 10/22/19 0318 10/23/19 0328  WBC 8.7   < > 5.0 5.9 4.6 5.0 4.9  NEUTROABS 7.4  --  3.5  --   --   --  2.8  HGB 9.9*   < > 9.6* 9.1* 8.3* 8.4* 8.7*  HCT 30.0*   < > 29.5* 27.9* 26.0* 25.8* 26.3*  MCV 101.4*   < > 102.8* 101.8*  104.0* 104.0* 101.9*  PLT 97*   < > 128* 118* 101* 95* 87*   < > = values in this interval not displayed.    Recent Labs    10/16/19 1800  BNP 840.6*    CBG: Recent Labs  Lab 10/21/19 1547 10/21/19 2112 10/21/19 2351 10/22/19 0420 10/22/19 0750  GLUCAP 122* 98 136* 86 108*    Principal Problem:   Aspiration pneumonia (HCC) Active Problems:   Hypothyroidism   Chronic diastolic heart failure (HCC)   Thrombocytopenia (HCC)   Paroxysmal atrial fibrillation (HCC)   Closed right fibular fracture   Anemia   Time coordinating discharge: 45 minutes  Signed:  Murray Hodgkins, MD  [facility]  10/23/2019, 5:29 PM

## 2019-10-25 ENCOUNTER — Telehealth: Payer: Self-pay | Admitting: *Deleted

## 2019-10-25 DIAGNOSIS — M109 Gout, unspecified: Secondary | ICD-10-CM | POA: Diagnosis not present

## 2019-10-25 DIAGNOSIS — M545 Low back pain: Secondary | ICD-10-CM | POA: Diagnosis not present

## 2019-10-25 DIAGNOSIS — L89891 Pressure ulcer of other site, stage 1: Secondary | ICD-10-CM | POA: Diagnosis not present

## 2019-10-25 DIAGNOSIS — M546 Pain in thoracic spine: Secondary | ICD-10-CM | POA: Diagnosis not present

## 2019-10-25 DIAGNOSIS — G8929 Other chronic pain: Secondary | ICD-10-CM | POA: Diagnosis not present

## 2019-10-25 DIAGNOSIS — E039 Hypothyroidism, unspecified: Secondary | ICD-10-CM | POA: Diagnosis not present

## 2019-10-25 NOTE — Telephone Encounter (Signed)
Sharyn Lull from [facility] calling for RN verbal orders as follows:  1 time(s) weekly for 9 week(s)  Verbal given per [facility] protocol  Christen Bame, CMA

## 2019-10-27 DIAGNOSIS — R1310 Dysphagia, unspecified: Secondary | ICD-10-CM | POA: Diagnosis not present

## 2019-10-27 DIAGNOSIS — I442 Atrioventricular block, complete: Secondary | ICD-10-CM | POA: Diagnosis not present

## 2019-10-27 DIAGNOSIS — G309 Alzheimer's disease, unspecified: Secondary | ICD-10-CM | POA: Diagnosis not present

## 2019-10-27 DIAGNOSIS — K3189 Other diseases of stomach and duodenum: Secondary | ICD-10-CM | POA: Diagnosis not present

## 2019-10-27 DIAGNOSIS — Z7901 Long term (current) use of anticoagulants: Secondary | ICD-10-CM | POA: Diagnosis not present

## 2019-10-27 DIAGNOSIS — M48061 Spinal stenosis, lumbar region without neurogenic claudication: Secondary | ICD-10-CM | POA: Diagnosis not present

## 2019-10-27 DIAGNOSIS — D631 Anemia in chronic kidney disease: Secondary | ICD-10-CM | POA: Diagnosis not present

## 2019-10-27 DIAGNOSIS — M1030 Gout due to renal impairment, unspecified site: Secondary | ICD-10-CM | POA: Diagnosis not present

## 2019-10-27 DIAGNOSIS — M1990 Unspecified osteoarthritis, unspecified site: Secondary | ICD-10-CM | POA: Diagnosis not present

## 2019-10-27 DIAGNOSIS — J690 Pneumonitis due to inhalation of food and vomit: Secondary | ICD-10-CM | POA: Diagnosis not present

## 2019-10-27 DIAGNOSIS — M353 Polymyalgia rheumatica: Secondary | ICD-10-CM | POA: Diagnosis not present

## 2019-10-27 DIAGNOSIS — Z79891 Long term (current) use of opiate analgesic: Secondary | ICD-10-CM | POA: Diagnosis not present

## 2019-10-27 DIAGNOSIS — M80061D Age-related osteoporosis with current pathological fracture, right lower leg, subsequent encounter for fracture with routine healing: Secondary | ICD-10-CM | POA: Diagnosis not present

## 2019-10-27 DIAGNOSIS — D696 Thrombocytopenia, unspecified: Secondary | ICD-10-CM | POA: Diagnosis not present

## 2019-10-27 DIAGNOSIS — E039 Hypothyroidism, unspecified: Secondary | ICD-10-CM | POA: Diagnosis not present

## 2019-10-27 DIAGNOSIS — F0280 Dementia in other diseases classified elsewhere without behavioral disturbance: Secondary | ICD-10-CM | POA: Diagnosis not present

## 2019-10-27 DIAGNOSIS — K219 Gastro-esophageal reflux disease without esophagitis: Secondary | ICD-10-CM | POA: Diagnosis not present

## 2019-10-27 DIAGNOSIS — E785 Hyperlipidemia, unspecified: Secondary | ICD-10-CM | POA: Diagnosis not present

## 2019-10-27 DIAGNOSIS — I4891 Unspecified atrial fibrillation: Secondary | ICD-10-CM | POA: Diagnosis not present

## 2019-10-27 DIAGNOSIS — Z950 Presence of cardiac pacemaker: Secondary | ICD-10-CM | POA: Diagnosis not present

## 2019-10-27 DIAGNOSIS — Z9181 History of falling: Secondary | ICD-10-CM | POA: Diagnosis not present

## 2019-10-27 DIAGNOSIS — I5032 Chronic diastolic (congestive) heart failure: Secondary | ICD-10-CM | POA: Diagnosis not present

## 2019-10-27 DIAGNOSIS — I130 Hypertensive heart and chronic kidney disease with heart failure and stage 1 through stage 4 chronic kidney disease, or unspecified chronic kidney disease: Secondary | ICD-10-CM | POA: Diagnosis not present

## 2019-10-27 DIAGNOSIS — N1832 Chronic kidney disease, stage 3b: Secondary | ICD-10-CM | POA: Diagnosis not present

## 2019-10-27 DIAGNOSIS — G894 Chronic pain syndrome: Secondary | ICD-10-CM | POA: Diagnosis not present

## 2019-10-28 ENCOUNTER — Ambulatory Visit: Payer: Medicare Other | Admitting: *Deleted

## 2019-10-28 ENCOUNTER — Telehealth: Payer: Self-pay | Admitting: Family Medicine

## 2019-10-28 NOTE — Chronic Care Management (AMB) (Signed)
  Chronic Care Management   EMMI Follow-up Note  10/28/2019 Name: Jordan [name] MRN: [id number] DOB: [birth year]  I am covering for Lazaro Arms, [place] and was contacted by [place] Management Assistant regarding a red flag from automated EMMI telephone call on 10/26/19, s/p discharge on 10/25/19. Patient answered that she had not been contacted to schedule a follow-up appointment.   I reached out to the [facility]'s nurse line and requested that they contact Ms Paget to schedule a hospital follow-up appointment. I was assured that they would reach out to schedule that appointment.     Follow up plan: Telephone follow up appointment with care management team member scheduled for:11/05/19 Appointment with PCP pending  Chong Sicilian, BSN, RN-BC [place] [facility] [place] Management Direct Dial: [phone]

## 2019-10-28 NOTE — Telephone Encounter (Signed)
Patients daughter is calling and would like to schedule her mother a follow up appointment. I offered her the times on Dr. McDiarmid's schedule and she said she isnt able to do mornings. She would like for Dr. Wendy Poet to call her to discuss.   The best number is [phone]

## 2019-10-29 DIAGNOSIS — I130 Hypertensive heart and chronic kidney disease with heart failure and stage 1 through stage 4 chronic kidney disease, or unspecified chronic kidney disease: Secondary | ICD-10-CM | POA: Diagnosis not present

## 2019-10-29 DIAGNOSIS — R1310 Dysphagia, unspecified: Secondary | ICD-10-CM | POA: Diagnosis not present

## 2019-10-29 DIAGNOSIS — J690 Pneumonitis due to inhalation of food and vomit: Secondary | ICD-10-CM | POA: Diagnosis not present

## 2019-10-29 DIAGNOSIS — M80061D Age-related osteoporosis with current pathological fracture, right lower leg, subsequent encounter for fracture with routine healing: Secondary | ICD-10-CM | POA: Diagnosis not present

## 2019-10-29 DIAGNOSIS — K3189 Other diseases of stomach and duodenum: Secondary | ICD-10-CM | POA: Diagnosis not present

## 2019-10-29 DIAGNOSIS — I5032 Chronic diastolic (congestive) heart failure: Secondary | ICD-10-CM | POA: Diagnosis not present

## 2019-10-29 NOTE — Telephone Encounter (Signed)
Called daughter back and had to leave a message.  McDiarmid is on inpatient until Thursday and is unable to call [name] back.  Please ask what I can help with if she calls back.  Thanks [employer]

## 2019-10-29 NOTE — Progress Notes (Signed)
This encounter was created in error - please disregard.

## 2019-10-30 ENCOUNTER — Telehealth: Payer: Self-pay

## 2019-10-30 NOTE — Telephone Encounter (Signed)
Iris Pert from [facility] calling for PT verbal orders as follows:  1 time(s) weekly for 1 week(s), then 2 time(s) weekly for 3 week(s), then 1 time (s) weekly for 1 week.   Verbal orders left on confidential VM per [facility] protocol  Talbot Grumbling, RN

## 2019-10-31 ENCOUNTER — Telehealth: Payer: Self-pay | Admitting: Family Medicine

## 2019-10-31 DIAGNOSIS — K224 Dyskinesia of esophagus: Secondary | ICD-10-CM

## 2019-10-31 DIAGNOSIS — T18128D Food in esophagus causing other injury, subsequent encounter: Secondary | ICD-10-CM

## 2019-10-31 DIAGNOSIS — E440 Moderate protein-calorie malnutrition: Secondary | ICD-10-CM

## 2019-10-31 DIAGNOSIS — R634 Abnormal weight loss: Secondary | ICD-10-CM

## 2019-10-31 DIAGNOSIS — R532 Functional quadriplegia: Secondary | ICD-10-CM

## 2019-10-31 NOTE — Telephone Encounter (Signed)
Jordan [name] called for mother (pt) want Jamine to give her a call   [phone]

## 2019-10-31 NOTE — Telephone Encounter (Signed)
Spoke with daughter and made her a virtual visit to follow up with MD since he didn't have any afternoon appointments.  Patient is having a hard time with the ensure as it doesn't agree with her stomach.  Daughter states that mother was given "Nepro" while in the hospital and that seemed to work well for her.  Daughter would like a script written for this so she can take it to [facility] home [facility].  Will forward to [facility]

## 2019-11-01 ENCOUNTER — Encounter: Payer: Self-pay | Admitting: Family Medicine

## 2019-11-01 DIAGNOSIS — R1310 Dysphagia, unspecified: Secondary | ICD-10-CM | POA: Diagnosis not present

## 2019-11-01 DIAGNOSIS — M80061D Age-related osteoporosis with current pathological fracture, right lower leg, subsequent encounter for fracture with routine healing: Secondary | ICD-10-CM | POA: Diagnosis not present

## 2019-11-01 DIAGNOSIS — K3189 Other diseases of stomach and duodenum: Secondary | ICD-10-CM | POA: Diagnosis not present

## 2019-11-01 DIAGNOSIS — I130 Hypertensive heart and chronic kidney disease with heart failure and stage 1 through stage 4 chronic kidney disease, or unspecified chronic kidney disease: Secondary | ICD-10-CM | POA: Diagnosis not present

## 2019-11-01 DIAGNOSIS — I5032 Chronic diastolic (congestive) heart failure: Secondary | ICD-10-CM | POA: Diagnosis not present

## 2019-11-01 DIAGNOSIS — J690 Pneumonitis due to inhalation of food and vomit: Secondary | ICD-10-CM | POA: Diagnosis not present

## 2019-11-01 MED ORDER — NEPRO/CARBSTEADY PO LIQD: 237.0000 mL | Freq: Two times a day (BID) | ORAL | 99 refills | Status: DC

## 2019-11-01 NOTE — Telephone Encounter (Signed)
Daughter is aware that pcp has written order and placed it up front.  Jaszmine Navejas,CMA

## 2019-11-01 NOTE — Telephone Encounter (Signed)
Please let Ms Vashti Hey know that the Rx for Nepro is at the [facility] front office ready for pick up.

## 2019-11-05 ENCOUNTER — Ambulatory Visit: Payer: Medicare Other

## 2019-11-05 ENCOUNTER — Other Ambulatory Visit: Payer: Self-pay

## 2019-11-05 NOTE — Patient Instructions (Signed)
Visit Information  Goals Addressed              This Visit's Progress   .  Mother had issues with swallowing (pt-stated)        CARE PLAN ENTRY (see longitudinal plan of care for additional care plan information)  Current Barriers:  . Chronic Disease Management support and education needs related to IP even of 10/13/19-10/23/19 Aspiration pneumonea  Nurse Case Manager Clinical Goal(s):  Marland Kitchen Over the next 30 days, daughter/patient will work with  RN Case Manager to address needs related to aspiration pneamonea  Interventions:  . Inter-disciplinary care team collaboration (see longitudinal plan of care) . Evaluation of current treatment plan related to discharge instructions and patient's adherence to plan as established by provider. . Patient has [place] in the home [facility] - PT and Nurse . The patient has all if her medications . The daughter states that the patient is doing well.  She states that the nurse has noticed some crackles in her left lower lung but they are monitoring that. Marland Kitchen RNCM talked with the daughter about a hospital bed for her mother.  She states that she will look at one first, talk with her mother and then get back with me if that is what they would like to do.  . I did explain to her what will be required to get the hospital bed.  She verbalized understanding.  Patient Self Care Activities:  . Daughter/Patient verbalizes understanding of plan t . Calls provider office for new concerns or questions  Initial goal documentation        Ms. Amberg was given information about Care Management services today including:  1. Care Management services include personalized support from designated clinical staff supervised by her physician, including individualized plan of care and coordination with other care providers 2. 24/7 contact phone numbers for assistance for urgent and routine care needs. 3. The patient may stop CCM services at any time (effective at the end of the  month) by phone call to the office staff.  Patient agreed to services and verbal consent obtained.   The patient verbalized understanding of instructions provided today and declined a print copy of patient instruction materials.   The care management team will reach out to the patient again over the next 14 days.  The patient has been provided with contact information for the care management team and has been advised to call with any health related questions or concerns.   Lazaro Arms RN, BSN, [facility] Care Management Coordinator [place] Phone: [phone] Fax: [phone]

## 2019-11-05 NOTE — Chronic Care Management (AMB) (Signed)
  Care Management   Follow Up Note   11/05/2019 Name: Jordan [name] MRN: [id number] DOB: September 30, 1919  Referred by: McDiarmid, Blane Ohara, MD Reason for referral : Care Coordination (Care Management RNCM Follow u p on hospital call)   Jordan [name] is a 86 y.o. year old female who is a primary care patient of McDiarmid, Blane Ohara, MD. The care management team was consulted for assistance with care management and care coordination needs.    Review of patient status, including review of consultants reports, relevant laboratory and other test results, and collaboration with appropriate care team members and the patient's provider was performed as part of comprehensive patient evaluation and provision of chronic care management services.    SDOH (Social Determinants of Health) assessments performed: No See Care Plan activities for detailed interventions related to [facility])     Advanced Directives: See Care Plan and Vynca application for related entries.   Goals Addressed              This Visit's Progress   .  Mother had issues with swallowing (pt-stated)        CARE PLAN ENTRY (see longitudinal plan of care for additional care plan information)  Current Barriers:  . Chronic Disease Management support and education needs related to IP even of 10/13/19-10/23/19 Aspiration pneumonea  Nurse Case Manager Clinical Goal(s):  Marland Kitchen Over the next 30 days, daughter/patient will work with  RN Case Manager to address needs related to aspiration pneamonea  Interventions:  . Inter-disciplinary care team collaboration (see longitudinal plan of care) . Evaluation of current treatment plan related to discharge instructions and patient's adherence to plan as established by provider. . Patient has [place] in the home [facility] - PT and Nurse . The patient has all if her medications . The daughter states that the patient is doing well.  She states that the nurse has noticed some crackles in her left lower lung but they are  monitoring that. Marland Kitchen RNCM talked with the daughter about a hospital bed for her mother.  She states that she will look at one first, talk with her mother and then get back with me if that is what they would like to do.  . I did explain to her what will be required to get the hospital bed.  She verbalized understanding.  Patient Self Care Activities:  . Daughter/Patient verbalizes understanding of plan t . Calls provider office for new concerns or questions  Initial goal documentation         The care management team will reach out to the patient again over the next 14 days.  The patient has been provided with contact information for the care management team and has been advised to call with any health related questions or concerns.   Lazaro Arms RN, BSN, [facility] Care Management Coordinator [place] Phone: [phone] Fax: [phone]

## 2019-11-06 DIAGNOSIS — I130 Hypertensive heart and chronic kidney disease with heart failure and stage 1 through stage 4 chronic kidney disease, or unspecified chronic kidney disease: Secondary | ICD-10-CM | POA: Diagnosis not present

## 2019-11-06 DIAGNOSIS — J690 Pneumonitis due to inhalation of food and vomit: Secondary | ICD-10-CM | POA: Diagnosis not present

## 2019-11-06 DIAGNOSIS — R1310 Dysphagia, unspecified: Secondary | ICD-10-CM | POA: Diagnosis not present

## 2019-11-06 DIAGNOSIS — K3189 Other diseases of stomach and duodenum: Secondary | ICD-10-CM | POA: Diagnosis not present

## 2019-11-06 DIAGNOSIS — I5032 Chronic diastolic (congestive) heart failure: Secondary | ICD-10-CM | POA: Diagnosis not present

## 2019-11-06 DIAGNOSIS — M80061D Age-related osteoporosis with current pathological fracture, right lower leg, subsequent encounter for fracture with routine healing: Secondary | ICD-10-CM | POA: Diagnosis not present

## 2019-11-07 ENCOUNTER — Telehealth: Payer: Self-pay

## 2019-11-07 DIAGNOSIS — K3189 Other diseases of stomach and duodenum: Secondary | ICD-10-CM | POA: Diagnosis not present

## 2019-11-07 DIAGNOSIS — I5032 Chronic diastolic (congestive) heart failure: Secondary | ICD-10-CM | POA: Diagnosis not present

## 2019-11-07 DIAGNOSIS — M80061D Age-related osteoporosis with current pathological fracture, right lower leg, subsequent encounter for fracture with routine healing: Secondary | ICD-10-CM | POA: Diagnosis not present

## 2019-11-07 DIAGNOSIS — I130 Hypertensive heart and chronic kidney disease with heart failure and stage 1 through stage 4 chronic kidney disease, or unspecified chronic kidney disease: Secondary | ICD-10-CM | POA: Diagnosis not present

## 2019-11-07 DIAGNOSIS — J690 Pneumonitis due to inhalation of food and vomit: Secondary | ICD-10-CM | POA: Diagnosis not present

## 2019-11-07 DIAGNOSIS — R1310 Dysphagia, unspecified: Secondary | ICD-10-CM | POA: Diagnosis not present

## 2019-11-07 NOTE — Telephone Encounter (Signed)
[facility] calls nurse line to report superficial shearing to bottom since recent hospitalization. [place] requesting orders to clean area with saline and apply protective patch. Verbal orders given per Dr. Wendy Poet.   FYI to PCP  Talbot Grumbling, RN

## 2019-11-07 NOTE — Telephone Encounter (Signed)
Patient daughter [name] called, stating that her mother(patient) has not received her bed side pacemaker monitor yet after it was ordered twice by Tomi Bamberger over a month ago. I called [employer] @ [phone] and spoke with Benjamine Mola who said that it does not show in her system that the monitor was ever ordered. She placed an order to expedite the monitor and will receive it in 2-4 days and patient is aware as she was on the phone as well, when I made the call. The [employer] rep also gave me a number to give [name] [phone]) to call for a tracking number after 24hrs.

## 2019-11-08 DIAGNOSIS — K3189 Other diseases of stomach and duodenum: Secondary | ICD-10-CM | POA: Diagnosis not present

## 2019-11-08 DIAGNOSIS — M80061D Age-related osteoporosis with current pathological fracture, right lower leg, subsequent encounter for fracture with routine healing: Secondary | ICD-10-CM | POA: Diagnosis not present

## 2019-11-08 DIAGNOSIS — I130 Hypertensive heart and chronic kidney disease with heart failure and stage 1 through stage 4 chronic kidney disease, or unspecified chronic kidney disease: Secondary | ICD-10-CM | POA: Diagnosis not present

## 2019-11-08 DIAGNOSIS — R1310 Dysphagia, unspecified: Secondary | ICD-10-CM | POA: Diagnosis not present

## 2019-11-08 DIAGNOSIS — I5032 Chronic diastolic (congestive) heart failure: Secondary | ICD-10-CM | POA: Diagnosis not present

## 2019-11-08 DIAGNOSIS — J690 Pneumonitis due to inhalation of food and vomit: Secondary | ICD-10-CM | POA: Diagnosis not present

## 2019-11-11 ENCOUNTER — Ambulatory Visit: Payer: Self-pay

## 2019-11-11 DIAGNOSIS — I5032 Chronic diastolic (congestive) heart failure: Secondary | ICD-10-CM | POA: Diagnosis not present

## 2019-11-11 DIAGNOSIS — R1310 Dysphagia, unspecified: Secondary | ICD-10-CM | POA: Diagnosis not present

## 2019-11-11 DIAGNOSIS — J690 Pneumonitis due to inhalation of food and vomit: Secondary | ICD-10-CM | POA: Diagnosis not present

## 2019-11-11 DIAGNOSIS — I130 Hypertensive heart and chronic kidney disease with heart failure and stage 1 through stage 4 chronic kidney disease, or unspecified chronic kidney disease: Secondary | ICD-10-CM | POA: Diagnosis not present

## 2019-11-11 DIAGNOSIS — K3189 Other diseases of stomach and duodenum: Secondary | ICD-10-CM | POA: Diagnosis not present

## 2019-11-11 DIAGNOSIS — M80061D Age-related osteoporosis with current pathological fracture, right lower leg, subsequent encounter for fracture with routine healing: Secondary | ICD-10-CM | POA: Diagnosis not present

## 2019-11-11 NOTE — Patient Instructions (Signed)
Visit Information  Goals Addressed              This Visit's Progress   .  I need silicon pads for my back (pt-stated)        CARE PLAN ENTRY (see longitudinal plan of care for additional care plan information)  Current Barriers:  . Care Coordination needs related to DME in a patient with Malnutrition, frailty and Unintentional weight loss.  Nurse Case Manager Clinical Goal(s):  Marland Kitchen Over the next 14 days, patient will verbalize understanding of plan for silicon adhesive pads  Interventions:  . Inter-disciplinary care team collaboration (see longitudinal plan of care) . Evaluation of current treatment plan related to silicon pads and patient's adherence to plan as established by provider. . Advised patient advised the patient that I would contact [employer] to find out any information on the silicon pads . Discussed plans with patient for ongoing care management follow up and provided patient with direct contact information for care management team . Daughter states that due to her mother's fraile nature she uses the pads to put on her coccyx to help deter any skin breakdown.  . 11/11/19 . Spoke with Jordan [name] She called in stating that she has an advance [place] taking care of her mother.  They have been using a different type of dressing to cover her skin tear that she likes better and would like a prescription for the  dressing. I advising Jordan [name] once she gets a chances to send me a picture of the dressing and I will ask the doctor to write her a prescription and print it for her to pick up.  She verbalized understanding.   Patient Self Care Activities:  . Patient/daughter verbalizes understanding of plan  . Attends all scheduled provider appointments . Calls provider office for new concerns or questions . Unable to independently self manage self navigation for DME  Please see past updates related to this goal by clicking on the "Past Updates" button in the selected  goal         Jordan [name] was given information about Care Management services today including:  1. Care Management services include personalized support from designated clinical staff supervised by her physician, including individualized plan of care and coordination with other care providers 2. 24/7 contact phone numbers for assistance for urgent and routine care needs. 3. The patient may stop CCM services at any time (effective at the end of the month) by phone call to the office staff.  Patient agreed to services and verbal consent obtained.   The patient verbalized understanding of instructions provided today and declined a print copy of patient instruction materials.   The care management team will reach out to the patient again over the next 10 days.   Lazaro Arms RN, BSN, [facility] Care Management Coordinator [place] Phone: [phone] Fax: [phone]

## 2019-11-11 NOTE — Chronic Care Management (AMB) (Signed)
°  Care Management   Follow Up Note   11/11/2019 Name: Jordan [name] MRN: [id number] DOB: 15-Oct-1919  Referred by: McDiarmid, Blane Ohara, MD Reason for referral : Chronic Care Management (Silicon Pads)   Jordan [name] is a 86 y.o. year old female who is a primary care patient of McDiarmid, Blane Ohara, MD. The care management team was consulted for assistance with care management and care coordination needs.    Review of patient status, including review of consultants reports, relevant laboratory and other test results, and collaboration with appropriate care team members and the patient's provider was performed as part of comprehensive patient evaluation and provision of chronic care management services.    SDOH (Social Determinants of Health) assessments performed: No See Care Plan activities for detailed interventions related to [facility])     Advanced Directives: See Care Plan and Vynca application for related entries.   Goals Addressed              This Visit's Progress     I need silicon pads for my back (pt-stated)        CARE PLAN ENTRY (see longitudinal plan of care for additional care plan information)  Current Barriers:   Care Coordination needs related to DME in a patient with Malnutrition, frailty and Unintentional weight loss.  Nurse Case Manager Clinical Goal(s):   Over the next 14 days, patient will verbalize understanding of plan for silicon adhesive pads  Interventions:   Inter-disciplinary care team collaboration (see longitudinal plan of care)  Evaluation of current treatment plan related to silicon pads and patient's adherence to plan as established by provider.  Advised patient advised the patient that I would contact [employer] to find out any information on the silicon pads  Discussed plans with patient for ongoing care management follow up and provided patient with direct contact information for care management team  Daughter states that due to her mother's  fraile nature she uses the pads to put on her coccyx to help deter any skin breakdown.   11/11/19  Spoke with Jordan [name] She called in stating that she has an advance [place] taking care of her mother.  They have been using a different type of dressing to cover her skin tear that she likes better and would like a prescription for the  dressing. I advising Jordan [name] once she gets a chances to send me a picture of the dressing and I will ask the doctor to write her a prescription and print it for her to pick up.  She verbalized understanding.   Patient Self Care Activities:   Patient/daughter verbalizes understanding of plan   Attends all scheduled provider appointments  Calls provider office for new concerns or questions  Unable to independently self manage self navigation for DME  Please see past updates related to this goal by clicking on the "Past Updates" button in the selected goal          The care management team will reach out to the patient again over the next 10 days.   Lazaro Arms RN, BSN, [facility] Care Management Coordinator [place] Phone: [phone] Fax: [phone]

## 2019-11-12 DIAGNOSIS — Z950 Presence of cardiac pacemaker: Secondary | ICD-10-CM | POA: Diagnosis not present

## 2019-11-12 DIAGNOSIS — Z45018 Encounter for adjustment and management of other part of cardiac pacemaker: Secondary | ICD-10-CM | POA: Diagnosis not present

## 2019-11-12 DIAGNOSIS — I442 Atrioventricular block, complete: Secondary | ICD-10-CM | POA: Diagnosis not present

## 2019-11-13 DIAGNOSIS — I5032 Chronic diastolic (congestive) heart failure: Secondary | ICD-10-CM | POA: Diagnosis not present

## 2019-11-13 DIAGNOSIS — I130 Hypertensive heart and chronic kidney disease with heart failure and stage 1 through stage 4 chronic kidney disease, or unspecified chronic kidney disease: Secondary | ICD-10-CM | POA: Diagnosis not present

## 2019-11-13 DIAGNOSIS — R1310 Dysphagia, unspecified: Secondary | ICD-10-CM | POA: Diagnosis not present

## 2019-11-13 DIAGNOSIS — M80061D Age-related osteoporosis with current pathological fracture, right lower leg, subsequent encounter for fracture with routine healing: Secondary | ICD-10-CM | POA: Diagnosis not present

## 2019-11-13 DIAGNOSIS — J690 Pneumonitis due to inhalation of food and vomit: Secondary | ICD-10-CM | POA: Diagnosis not present

## 2019-11-13 DIAGNOSIS — K3189 Other diseases of stomach and duodenum: Secondary | ICD-10-CM | POA: Diagnosis not present

## 2019-11-14 ENCOUNTER — Encounter: Payer: Self-pay | Admitting: Family Medicine

## 2019-11-14 ENCOUNTER — Telehealth (INDEPENDENT_AMBULATORY_CARE_PROVIDER_SITE_OTHER): Payer: Medicare Other | Admitting: Family Medicine

## 2019-11-14 DIAGNOSIS — F0280 Dementia in other diseases classified elsewhere without behavioral disturbance: Secondary | ICD-10-CM | POA: Diagnosis not present

## 2019-11-14 DIAGNOSIS — J690 Pneumonitis due to inhalation of food and vomit: Secondary | ICD-10-CM

## 2019-11-14 DIAGNOSIS — G301 Alzheimer's disease with late onset: Secondary | ICD-10-CM

## 2019-11-14 DIAGNOSIS — I700 Atherosclerosis of aorta: Secondary | ICD-10-CM | POA: Diagnosis not present

## 2019-11-14 DIAGNOSIS — R1310 Dysphagia, unspecified: Secondary | ICD-10-CM

## 2019-11-14 DIAGNOSIS — E440 Moderate protein-calorie malnutrition: Secondary | ICD-10-CM | POA: Diagnosis not present

## 2019-11-14 DIAGNOSIS — J479 Bronchiectasis, uncomplicated: Secondary | ICD-10-CM

## 2019-11-14 DIAGNOSIS — D631 Anemia in chronic kidney disease: Secondary | ICD-10-CM

## 2019-11-14 DIAGNOSIS — K224 Dyskinesia of esophagus: Secondary | ICD-10-CM

## 2019-11-14 DIAGNOSIS — N184 Chronic kidney disease, stage 4 (severe): Secondary | ICD-10-CM | POA: Diagnosis not present

## 2019-11-14 DIAGNOSIS — R532 Functional quadriplegia: Secondary | ICD-10-CM | POA: Diagnosis not present

## 2019-11-14 DIAGNOSIS — N1831 Chronic kidney disease, stage 3a: Secondary | ICD-10-CM

## 2019-11-14 DIAGNOSIS — D7589 Other specified diseases of blood and blood-forming organs: Secondary | ICD-10-CM

## 2019-11-14 DIAGNOSIS — N1832 Chronic kidney disease, stage 3b: Secondary | ICD-10-CM

## 2019-11-14 DIAGNOSIS — S82831D Other fracture of upper and lower end of right fibula, subsequent encounter for closed fracture with routine healing: Secondary | ICD-10-CM

## 2019-11-14 NOTE — Assessment & Plan Note (Addendum)
10/18/19 Barium esophagram: Severe esophageal dysmotility is seen with break up of primary peristalsis in the proximal stomach and intermittent tertiary contractions causing esophageal stasis.   Feeding interventions Chin tucks, small bites, clearance between bites

## 2019-11-14 NOTE — Progress Notes (Addendum)
I spent 45 minutes spent in preparation for visit, telephone encounter, and coordination of care after the visit.  Jordan [name] / E- Visit   This visit type was conducted due to national recommendations for restrictions regarding the COVID-19 Pandemic (e.g. social distancing) in an effort to limit this patient's exposure and mitigate transmission in our community.   Due to their co-morbid illnesses, this patient is at least at moderate risk for complications without adequate follow up: yes   This format is felt to be most appropriate for this patient at this time.  All issues noted in this document were discussed and addressed.  A limited physical exam was performed with this format.   Patient's identity was confirmed using her Full Name and DOB.  We discussed the limitations of evaluation and management by telemedicine and the availability of in person appointments.  We discussed the limitations in assessment, security and privacy with performing an evaluation and management service by telephone and the availability of in person appointments.  We discussed with the patient that there may be a patient responsible charge related to this service.  The patient expressed understanding and agreed to proceed.   Encounter participants: Patient: Jordan [name]  Patient Location: Home Provider: Sherren Mocha Tahisha Hakim at office  Others (if applicable): [name] Ruales ([name]) who was the primary historian for the visit.  Jordan [name], along with her brother, is the primary caretaker for her mother.   Sources of clinical information for visit is/are patient, relative(s), past medical records and Discharge Summary dated 10/23/19. The Discharge Summary for the hospitalization from 10/16/19 to 10/23/19 was reviewed.    HPI Principle Diagnosis requiring hospitalization: Aspiration Pneumonia with acute hypoxic respiratory failure  Brief Hospital course summary:  Patient was was treated  with antibiotics with gradual clinical improvement, resolution of hypoxia.  Because of aspiration was seen both by speech therapy with recommendations as below as well as gastroenterology.  Underwent EGD with findings as below.  Condition gradually improved, hospitalization was uncomplicated.  Individual issues as below. GI felt dysphagia secondary to esophageal dysmotility.  Mild aspiration risk per speech therapy. Dysphagia 3 (Mech soft);Thin liquid  Liquid Administration via: Cup;Straw Medication Administration: Whole meds with liquid (have puree on hand to use if needed, start with liquid bolus first) Supervision: Patient able to self feed Compensations: Slow rate;Small sips/bites Postural Changes: Seated upright at 90 degrees;Remain upright for at least 30 minutes after po intake  Recent nondisplaced fracture right distal fibula.Continue cam boot with ambulation. Outpatient follow-up with orthopedics.  TTE Echocardiogram this admission showed preserved LVEF: trace pedal edema only, appears euvolemic on discharge     Follow up instructions from patient's hospital healthcare providers:  Recommendations for Outpatient Follow-up:   Dysphagia unspecified  Recent nondisplaced fracture right distal fibula  Chronic microcytic anemia, chronic thrombocytopenia ---------------------------------------------------------------------------------------------------------------------- Problems since hospital discharge: None No difficulty swallowing, no difficulty breathing, no pain in right foreleg,   ---------------------------------------------------------------------------------------------------------------------- Follow up appointments with specialists:  Jordan [name] does not plan to have her mother see orthopedists.  Right leg seems to be functioning well.   ---------------------------------------------------------------------------------------------------------------------- New  medications started during hospitalization: None Chronic medications stopped during hospitalization: none Medication list was reviewed with Jordan [name] and updated  --------------------------------------------------------------------------------------------------------------------- [place] [facility] PT, RN via [place]: no new DME --------------------------------------------------------------------------------------------------------------------- ADLs Independent Needs Assistance Dependent  Bathing   x  Dressing   x  Ambulation   x  Toileting  x  Eating  x     A/P  Visit Problem List with A/P  Esophageal dysmotility, Severe 10/18/19 Barium esophagram: Severe esophageal dysmotility is seen with break up of primary peristalsis in the proximal stomach and intermittent tertiary contractions causing esophageal stasis.   Feeding interventions Chin tucks, small bites, clearance between bites  Alzheimer's dementia ([place]) Established problem. Stable. No behavioral Issues.  Has Frailty syndrome with premorbid state   Aspiration pneumonia ([place]) ACute condition - resolved Attempt at prevention with Dysphagia III diet with thin liquids.  Atherosclerosis of aorta ([place]) Established problem. Stable. Continue current medications and other regiments.   Bronchiectasis ([place]) Established problem. Stable. Continue current medications and other regiments.   CKD stage G3b/A1, GFR 30-44 and albumin creatinine ratio <30 mg/g Established problem that has improved.  Continue current medication regiment.   Complete immobility due to severe physical disability or frailty ([place]) Established problem. Stable. Between her dgt, son and hired custodial care, patient has 24/7 care. .   Moderate protein-calorie malnutrition ([place]) Established problem Continuing Nepro. Patient does not like ProStat, so DC'd.  Anemia Last CBC Lab  Results  Component Value Date   WBC 4.9 10/23/2019   HGB 8.7 (L) 10/23/2019   HCT 26.3 (L) 10/23/2019   MCV 101.9 (H) 10/23/2019   MCH 33.7 10/23/2019   RDW 16.8 (H) 10/23/2019   PLT 87 (L) 10/23/2019  VVitmin B12 > 1000, Retic wnl Ddx: Folate deficiency, CKD, myelofibrosis Plan Monitor, check folate  Closed right fibular fracture Established problem that has improved.  Jordan [name] is about 5 weeks from injury.  No pain with standing.  Pt not wearing boot. It sounds like Jordan [name] does not need to follow up with orthopedics at this time.

## 2019-11-15 DIAGNOSIS — M80061D Age-related osteoporosis with current pathological fracture, right lower leg, subsequent encounter for fracture with routine healing: Secondary | ICD-10-CM | POA: Diagnosis not present

## 2019-11-15 DIAGNOSIS — I5032 Chronic diastolic (congestive) heart failure: Secondary | ICD-10-CM | POA: Diagnosis not present

## 2019-11-15 DIAGNOSIS — I130 Hypertensive heart and chronic kidney disease with heart failure and stage 1 through stage 4 chronic kidney disease, or unspecified chronic kidney disease: Secondary | ICD-10-CM | POA: Diagnosis not present

## 2019-11-15 DIAGNOSIS — K3189 Other diseases of stomach and duodenum: Secondary | ICD-10-CM | POA: Diagnosis not present

## 2019-11-15 DIAGNOSIS — J690 Pneumonitis due to inhalation of food and vomit: Secondary | ICD-10-CM | POA: Diagnosis not present

## 2019-11-15 DIAGNOSIS — R1310 Dysphagia, unspecified: Secondary | ICD-10-CM | POA: Diagnosis not present

## 2019-11-18 ENCOUNTER — Other Ambulatory Visit: Payer: Medicare Other

## 2019-11-18 ENCOUNTER — Telehealth: Payer: Self-pay | Admitting: Family Medicine

## 2019-11-18 NOTE — Addendum Note (Signed)
Addended byWendy Poet, Isabellamarie Randa D on: 11/18/2019 10:55 AM   Modules accepted: Level of Service

## 2019-11-18 NOTE — Telephone Encounter (Signed)
Spoke with daughter to let her know that pcp was faxing orders to advanced home care but daughter would prefer to bring patient into the office for labs.  She doesn't want any delays with results.  Future orders have been placed and lab appt made. Irania Durell,CMA

## 2019-11-18 NOTE — Telephone Encounter (Signed)
Patients daughter is calling and would like to check on the status of a referral being placed for home health to come draw labs for the patient at home.   The daughter asks that if this has not been done yet that she would prefer to bring her mother to our office for the labs Dr. McDiarmid would like her to complete.   Please call daughter to discuss [phone]

## 2019-11-18 NOTE — Assessment & Plan Note (Signed)
Established problem that has improved.  Jordan [name] is about 5 weeks from injury.  No pain with standing.  Pt not wearing boot. It sounds like Jordan [name] does not need to follow up with orthopedics at this time.

## 2019-11-18 NOTE — Assessment & Plan Note (Signed)
Established problem. Stable. Between her dgt, son and hired custodial care, patient has 24/7 care. Marland Kitchen

## 2019-11-18 NOTE — Assessment & Plan Note (Signed)
Established problem Continuing Nepro. Patient does not like ProStat, so DC'd.

## 2019-11-18 NOTE — Addendum Note (Signed)
Addended byWendy Poet, Zyire Eidson D on: 11/18/2019 11:37 AM   Modules accepted: Orders

## 2019-11-18 NOTE — Assessment & Plan Note (Signed)
Established problem. Stable. Continue current medications and other regiments.  

## 2019-11-18 NOTE — Assessment & Plan Note (Signed)
Established problem that has improved.  Continue current medication regiment.

## 2019-11-18 NOTE — Assessment & Plan Note (Addendum)
ACute condition - resolved Attempt at prevention with Dysphagia III diet with thin liquids.

## 2019-11-18 NOTE — Assessment & Plan Note (Signed)
Established problem. Stable. No behavioral Issues.  Has Frailty syndrome with premorbid state

## 2019-11-18 NOTE — Assessment & Plan Note (Signed)
Last CBC Lab Results  Component Value Date   WBC 4.9 10/23/2019   HGB 8.7 (L) 10/23/2019   HCT 26.3 (L) 10/23/2019   MCV 101.9 (H) 10/23/2019   MCH 33.7 10/23/2019   RDW 16.8 (H) 10/23/2019   PLT 87 (L) 10/23/2019  VVitmin B12 > 1000, Retic wnl Ddx: Folate deficiency, CKD, myelofibrosis Plan Monitor, check folate

## 2019-11-19 ENCOUNTER — Other Ambulatory Visit: Payer: Self-pay | Admitting: Family Medicine

## 2019-11-19 DIAGNOSIS — M80061D Age-related osteoporosis with current pathological fracture, right lower leg, subsequent encounter for fracture with routine healing: Secondary | ICD-10-CM | POA: Diagnosis not present

## 2019-11-19 DIAGNOSIS — G894 Chronic pain syndrome: Secondary | ICD-10-CM

## 2019-11-19 DIAGNOSIS — I5032 Chronic diastolic (congestive) heart failure: Secondary | ICD-10-CM | POA: Diagnosis not present

## 2019-11-19 DIAGNOSIS — K3189 Other diseases of stomach and duodenum: Secondary | ICD-10-CM | POA: Diagnosis not present

## 2019-11-19 DIAGNOSIS — J690 Pneumonitis due to inhalation of food and vomit: Secondary | ICD-10-CM | POA: Diagnosis not present

## 2019-11-19 DIAGNOSIS — I130 Hypertensive heart and chronic kidney disease with heart failure and stage 1 through stage 4 chronic kidney disease, or unspecified chronic kidney disease: Secondary | ICD-10-CM | POA: Diagnosis not present

## 2019-11-19 DIAGNOSIS — R1310 Dysphagia, unspecified: Secondary | ICD-10-CM | POA: Diagnosis not present

## 2019-11-20 ENCOUNTER — Other Ambulatory Visit: Payer: Medicare Other

## 2019-11-20 ENCOUNTER — Other Ambulatory Visit: Payer: Self-pay

## 2019-11-20 ENCOUNTER — Ambulatory Visit: Payer: Self-pay

## 2019-11-20 DIAGNOSIS — R1310 Dysphagia, unspecified: Secondary | ICD-10-CM | POA: Diagnosis not present

## 2019-11-20 DIAGNOSIS — E440 Moderate protein-calorie malnutrition: Secondary | ICD-10-CM

## 2019-11-20 DIAGNOSIS — Z9189 Other specified personal risk factors, not elsewhere classified: Secondary | ICD-10-CM

## 2019-11-20 DIAGNOSIS — D631 Anemia in chronic kidney disease: Secondary | ICD-10-CM | POA: Diagnosis not present

## 2019-11-20 DIAGNOSIS — N184 Chronic kidney disease, stage 4 (severe): Secondary | ICD-10-CM

## 2019-11-20 DIAGNOSIS — N1831 Chronic kidney disease, stage 3a: Secondary | ICD-10-CM | POA: Diagnosis not present

## 2019-11-20 NOTE — Patient Instructions (Signed)
Visit Information  Goals Addressed              This Visit's Progress   .  I need silicon pads for my back (pt-stated)        CARE PLAN ENTRY (see longitudinal plan of care for additional care plan information)  Current Barriers:  . Care Coordination needs related to DME in a patient with Malnutrition, frailty and Unintentional weight loss.  Nurse Case Manager Clinical Goal(s):  Marland Kitchen Over the next 14 days, patient will verbalize understanding of plan for silicon adhesive pads  Interventions:  . Inter-disciplinary care team collaboration (see longitudinal plan of care) . Evaluation of current treatment plan related to silicon pads and patient's adherence to plan as established by provider. . Advised patient advised the patient that I would contact [employer] to find out any information on the silicon pads . Discussed plans with patient for ongoing care management follow up and provided patient with direct contact information for care management team . Daughter states that due to her mother's fraile nature she uses the pads to put on her coccyx to help deter any skin breakdown.  . 11/20/19 . Spoke with Mrs Jordan [name] She called in today and has sent me an email with a picture with the  new sacrum pad that [facility] has been using for her mother.  She would like to get a prescription for the new pad so she can take this to the caps worker for her mother.I advised her that I will send a note to Dr McDiarmid the information.  Patient Self Care Activities:  . Patient/daughter verbalizes understanding of plan  . Attends all scheduled provider appointments . Calls provider office for new concerns or questions . Unable to independently self manage self navigation for DME  Please see past updates related to this goal by clicking on the "Past Updates" button in the selected goal         [name] was given information about Care Management services today including:  1. Care Management  services include personalized support from designated clinical staff supervised by her physician, including individualized plan of care and coordination with other care providers 2. 24/7 contact phone numbers for assistance for urgent and routine care needs. 3. The patient may stop CCM services at any time (effective at the end of the month) by phone call to the office staff.  Patient agreed to services and verbal consent obtained.   The patient verbalized understanding of instructions provided today and declined a print copy of patient instruction materials.   The care management team will reach out to the patient again over the next 14 days.   Lazaro Arms RN, BSN, [facility] Care Management Coordinator [place] Phone: [phone] Fax: [phone]

## 2019-11-20 NOTE — Chronic Care Management (AMB) (Signed)
  Care Management   Follow Up Note   11/20/2019 Name: FLORIE CARICO MRN: [id number] DOB: [birth year]  Referred by: McDiarmid, Blane Ohara, MD Reason for referral : Chronic Care Management (Sacrum Pad)   KELE WITHEM is a 86 y.o. year old female who is a primary care patient of McDiarmid, Blane Ohara, MD. The care management team was consulted for assistance with care management and care coordination needs.    Review of patient status, including review of consultants reports, relevant laboratory and other test results, and collaboration with appropriate care team members and the patient's provider was performed as part of comprehensive patient evaluation and provision of chronic care management services.    SDOH (Social Determinants of Health) assessments performed: No See Care Plan activities for detailed interventions related to [facility])     Advanced Directives: See Care Plan and Vynca application for related entries.   Goals Addressed              This Visit's Progress   .  I need silicon pads for my back (pt-stated)        CARE PLAN ENTRY (see longitudinal plan of care for additional care plan information)  Current Barriers:  . Care Coordination needs related to DME in a patient with Malnutrition, frailty and Unintentional weight loss.  Nurse Case Manager Clinical Goal(s):  Marland Kitchen Over the next 14 days, patient will verbalize understanding of plan for silicon adhesive pads  Interventions:  . Inter-disciplinary care team collaboration (see longitudinal plan of care) . Evaluation of current treatment plan related to silicon pads and patient's adherence to plan as established by provider. . Advised patient advised the patient that I would contact [employer] to find out any information on the silicon pads . Discussed plans with patient for ongoing care management follow up and provided patient with direct contact information for care management team . Daughter states that due to her mother's  fraile nature she uses the pads to put on her coccyx to help deter any skin breakdown.  . 11/20/19 . Spoke with [name] She called in today and has sent me an email with a picture with the  new sacrum pad that [facility] has been using for her mother.  She would like to get a prescription for the new pad so she can take this to the caps worker for her mother.I advised her that I will send a note to Dr McDiarmid the information.  Patient Self Care Activities:  . Patient/daughter verbalizes understanding of plan  . Attends all scheduled provider appointments . Calls provider office for new concerns or questions . Unable to independently self manage self navigation for DME  Please see past updates related to this goal by clicking on the "Past Updates" button in the selected goal          The care management team will reach out to the patient again over the next 14 days.   Lazaro Arms RN, BSN, [facility] Care Management Coordinator [place] Phone: [phone] Fax: [phone]

## 2019-11-21 DIAGNOSIS — K3189 Other diseases of stomach and duodenum: Secondary | ICD-10-CM | POA: Diagnosis not present

## 2019-11-21 DIAGNOSIS — M80061D Age-related osteoporosis with current pathological fracture, right lower leg, subsequent encounter for fracture with routine healing: Secondary | ICD-10-CM | POA: Diagnosis not present

## 2019-11-21 DIAGNOSIS — R1310 Dysphagia, unspecified: Secondary | ICD-10-CM | POA: Diagnosis not present

## 2019-11-21 DIAGNOSIS — I5032 Chronic diastolic (congestive) heart failure: Secondary | ICD-10-CM | POA: Diagnosis not present

## 2019-11-21 DIAGNOSIS — I130 Hypertensive heart and chronic kidney disease with heart failure and stage 1 through stage 4 chronic kidney disease, or unspecified chronic kidney disease: Secondary | ICD-10-CM | POA: Diagnosis not present

## 2019-11-21 DIAGNOSIS — J690 Pneumonitis due to inhalation of food and vomit: Secondary | ICD-10-CM | POA: Diagnosis not present

## 2019-11-21 LAB — BASIC METABOLIC PANEL
BUN/Creatinine Ratio: 18 (ref 12–28)
BUN: 21 mg/dL (ref 10–36)
CO2: 30 mmol/L — ABNORMAL HIGH (ref 20–29)
Calcium: 9.1 mg/dL (ref 8.7–10.3)
Chloride: 96 mmol/L (ref 96–106)
Creatinine, Ser: 1.18 mg/dL — ABNORMAL HIGH (ref 0.57–1.00)
GFR calc Af Amer: 44 mL/min/{1.73_m2} — ABNORMAL LOW (ref >59)
GFR calc non Af Amer: 38 mL/min/{1.73_m2} — ABNORMAL LOW (ref >59)
Glucose: 102 mg/dL — ABNORMAL HIGH (ref 65–99)
Potassium: 4.5 mmol/L (ref 3.5–5.2)
Sodium: 134 mmol/L (ref 134–144)

## 2019-11-21 LAB — CBC WITH DIFFERENTIAL/PLATELET
Basophils Absolute: 0 10*3/uL (ref 0.0–0.2)
Basos: 1 %
EOS (ABSOLUTE): 0 10*3/uL (ref 0.0–0.4)
Eos: 1 %
Hematocrit: 28.4 % — ABNORMAL LOW (ref 34.0–46.6)
Hemoglobin: 9.5 g/dL — ABNORMAL LOW (ref 11.1–15.9)
Immature Grans (Abs): 0 10*3/uL (ref 0.0–0.1)
Immature Granulocytes: 0 %
Lymphocytes Absolute: 1.1 10*3/uL (ref 0.7–3.1)
Lymphs: 37 %
MCH: 33.7 pg — ABNORMAL HIGH (ref 26.6–33.0)
MCHC: 33.5 g/dL (ref 31.5–35.7)
MCV: 101 fL — ABNORMAL HIGH (ref 79–97)
Monocytes Absolute: 0.3 10*3/uL (ref 0.1–0.9)
Monocytes: 10 %
Neutrophils Absolute: 1.6 10*3/uL (ref 1.4–7.0)
Neutrophils: 51 %
Platelets: 101 10*3/uL — ABNORMAL LOW (ref 150–450)
RBC: 2.82 x10E6/uL — ABNORMAL LOW (ref 3.77–5.28)
RDW: 15.4 % (ref 11.7–15.4)
WBC: 3.1 10*3/uL — ABNORMAL LOW (ref 3.4–10.8)

## 2019-11-21 LAB — FOLATE: Folate: 18.8 ng/mL (ref >3.0)

## 2019-11-21 LAB — TSH: TSH: 0.798 u[IU]/mL (ref 0.450–4.500)

## 2019-11-26 DIAGNOSIS — D631 Anemia in chronic kidney disease: Secondary | ICD-10-CM | POA: Diagnosis not present

## 2019-11-26 DIAGNOSIS — Z9181 History of falling: Secondary | ICD-10-CM | POA: Diagnosis not present

## 2019-11-26 DIAGNOSIS — M353 Polymyalgia rheumatica: Secondary | ICD-10-CM | POA: Diagnosis not present

## 2019-11-26 DIAGNOSIS — J690 Pneumonitis due to inhalation of food and vomit: Secondary | ICD-10-CM | POA: Diagnosis not present

## 2019-11-26 DIAGNOSIS — G894 Chronic pain syndrome: Secondary | ICD-10-CM | POA: Diagnosis not present

## 2019-11-26 DIAGNOSIS — M1030 Gout due to renal impairment, unspecified site: Secondary | ICD-10-CM | POA: Diagnosis not present

## 2019-11-26 DIAGNOSIS — M80061D Age-related osteoporosis with current pathological fracture, right lower leg, subsequent encounter for fracture with routine healing: Secondary | ICD-10-CM | POA: Diagnosis not present

## 2019-11-26 DIAGNOSIS — F0280 Dementia in other diseases classified elsewhere without behavioral disturbance: Secondary | ICD-10-CM | POA: Diagnosis not present

## 2019-11-26 DIAGNOSIS — I5032 Chronic diastolic (congestive) heart failure: Secondary | ICD-10-CM | POA: Diagnosis not present

## 2019-11-26 DIAGNOSIS — M48061 Spinal stenosis, lumbar region without neurogenic claudication: Secondary | ICD-10-CM | POA: Diagnosis not present

## 2019-11-26 DIAGNOSIS — I4891 Unspecified atrial fibrillation: Secondary | ICD-10-CM | POA: Diagnosis not present

## 2019-11-26 DIAGNOSIS — N1832 Chronic kidney disease, stage 3b: Secondary | ICD-10-CM | POA: Diagnosis not present

## 2019-11-26 DIAGNOSIS — M1990 Unspecified osteoarthritis, unspecified site: Secondary | ICD-10-CM | POA: Diagnosis not present

## 2019-11-26 DIAGNOSIS — D696 Thrombocytopenia, unspecified: Secondary | ICD-10-CM | POA: Diagnosis not present

## 2019-11-26 DIAGNOSIS — Z7901 Long term (current) use of anticoagulants: Secondary | ICD-10-CM | POA: Diagnosis not present

## 2019-11-26 DIAGNOSIS — I442 Atrioventricular block, complete: Secondary | ICD-10-CM | POA: Diagnosis not present

## 2019-11-26 DIAGNOSIS — Z79891 Long term (current) use of opiate analgesic: Secondary | ICD-10-CM | POA: Diagnosis not present

## 2019-11-26 DIAGNOSIS — K3189 Other diseases of stomach and duodenum: Secondary | ICD-10-CM | POA: Diagnosis not present

## 2019-11-26 DIAGNOSIS — K219 Gastro-esophageal reflux disease without esophagitis: Secondary | ICD-10-CM | POA: Diagnosis not present

## 2019-11-26 DIAGNOSIS — E785 Hyperlipidemia, unspecified: Secondary | ICD-10-CM | POA: Diagnosis not present

## 2019-11-26 DIAGNOSIS — Z950 Presence of cardiac pacemaker: Secondary | ICD-10-CM | POA: Diagnosis not present

## 2019-11-26 DIAGNOSIS — E039 Hypothyroidism, unspecified: Secondary | ICD-10-CM | POA: Diagnosis not present

## 2019-11-26 DIAGNOSIS — G309 Alzheimer's disease, unspecified: Secondary | ICD-10-CM | POA: Diagnosis not present

## 2019-11-26 DIAGNOSIS — R1310 Dysphagia, unspecified: Secondary | ICD-10-CM | POA: Diagnosis not present

## 2019-11-26 DIAGNOSIS — I130 Hypertensive heart and chronic kidney disease with heart failure and stage 1 through stage 4 chronic kidney disease, or unspecified chronic kidney disease: Secondary | ICD-10-CM | POA: Diagnosis not present

## 2019-11-26 MED ORDER — ALLEVYN LIFE SACRUM EX PADS: 1.0000 [IU] | MEDICATED_PAD | CUTANEOUS | 99 refills | Status: AC | PRN

## 2019-11-26 NOTE — Addendum Note (Signed)
Addended byWendy Poet, Melia Hopes D on: 11/26/2019 10:54 AM   Modules accepted: Orders

## 2019-11-26 NOTE — Progress Notes (Signed)
Ordered [employer] Sacrum foam pad for patient to use at home at high risk for pressure injury to sacral skin.

## 2019-11-27 ENCOUNTER — Ambulatory Visit: Payer: Medicare Other

## 2019-11-27 NOTE — Patient Instructions (Signed)
Visit Information  Goals Addressed              This Visit's Progress   .  I need silicon pads for my back (pt-stated)        CARE PLAN ENTRY (see longitudinal plan of care for additional care plan information)  Current Barriers:  . Care Coordination needs related to DME in a patient with Malnutrition, frailty and Unintentional weight loss.  Nurse Case Manager Clinical Goal(s):  Marland Kitchen Over the next 14 days, patient will verbalize understanding of plan for silicon adhesive pads  Interventions:  . Inter-disciplinary care team collaboration (see longitudinal plan of care) . Evaluation of current treatment plan related to silicon pads and patient's adherence to plan as established by provider. . Advised patient advised the patient that I would contact [employer] to find out any information on the silicon pads . Discussed plans with patient for ongoing care management follow up and provided patient with direct contact information for care management team . Daughter states that due to her mother's fraile nature she uses the pads to put on her coccyx to help deter any skin breakdown.  . 11/27/19 . Spoke with [name] today to let her know that Dr. McDiarmid had written out anew prescription for the new silicon pads that she wanted.  I told her that it will be up at the front in an envelope with her mother's name on it.  She verbalized understanding and stated that she will try to come by the office to day and pick it up.   . Patient/daughter verbalizes understanding of plan  . Attends all scheduled provider appointments . Calls provider office for new concerns or questions . Unable to independently self manage self navigation for DME  Please see past updates related to this goal by clicking on the "Past Updates" button in the selected goal         Ms. Filippone was given information about Care Management services today including:  1. Care Management services include personalized support  from designated clinical staff supervised by her physician, including individualized plan of care and coordination with other care providers 2. 24/7 contact phone numbers for assistance for urgent and routine care needs. 3. The patient may stop CCM services at any time (effective at the end of the month) by phone call to the office staff.  Patient agreed to services and verbal consent obtained.   The patient verbalized understanding of instructions provided today and declined a print copy of patient instruction materials.   The care management team will reach out to the patient again over the next 10 days.   Lazaro Arms RN, BSN, [facility] Care Management Coordinator [place] Phone: [phone] Fax: [phone]

## 2019-11-27 NOTE — Chronic Care Management (AMB) (Signed)
  Care Management   Follow Up Note   11/27/2019 Name: Jordan [name] MRN: [id number] DOB: 16-Dec-1919  Referred by: McDiarmid, Blane Ohara, MD Reason for referral : Chronic Care Management (Silicon Pads)   Jordan [name] is a 86 y.o. year old female who is a primary care patient of McDiarmid, Blane Ohara, MD. The care management team was consulted for assistance with care management and care coordination needs.    Review of patient status, including review of consultants reports, relevant laboratory and other test results, and collaboration with appropriate care team members and the patient's provider was performed as part of comprehensive patient evaluation and provision of chronic care management services.    SDOH (Social Determinants of Health) assessments performed: No See Care Plan activities for detailed interventions related to [facility])     Advanced Directives: See Care Plan and Vynca application for related entries.   Goals Addressed              This Visit's Progress   .  I need silicon pads for my back (pt-stated)        CARE PLAN ENTRY (see longitudinal plan of care for additional care plan information)  Current Barriers:  . Care Coordination needs related to DME in a patient with Malnutrition, frailty and Unintentional weight loss.  Nurse Case Manager Clinical Goal(s):  Marland Kitchen Over the next 14 days, patient will verbalize understanding of plan for silicon adhesive pads  Interventions:  . Inter-disciplinary care team collaboration (see longitudinal plan of care) . Evaluation of current treatment plan related to silicon pads and patient's adherence to plan as established by provider. . Advised patient advised the patient that I would contact [employer] to find out any information on the silicon pads . Discussed plans with patient for ongoing care management follow up and provided patient with direct contact information for care management team . Daughter states that due to her mother's  fraile nature she uses the pads to put on her coccyx to help deter any skin breakdown.  . 11/27/19 . Spoke with Mrs Jordan [name] today to let her know that Dr. McDiarmid had written out anew prescription for the new silicon pads that she wanted.  I told her that it will be up at the front in an envelope with her mother's name on it.  She verbalized understanding and stated that she will try to come by the office to day and pick it up.   . Patient/daughter verbalizes understanding of plan  . Attends all scheduled provider appointments . Calls provider office for new concerns or questions . Unable to independently self manage self navigation for DME  Please see past updates related to this goal by clicking on the "Past Updates" button in the selected goal          The care management team will reach out to the patient again over the next 10 days.   Lazaro Arms RN, BSN, [facility] Care Management Coordinator [place] Phone: [phone] Fax: [phone]

## 2019-11-28 ENCOUNTER — Telehealth: Payer: Medicare Other

## 2019-11-28 DIAGNOSIS — R1310 Dysphagia, unspecified: Secondary | ICD-10-CM | POA: Diagnosis not present

## 2019-11-28 DIAGNOSIS — M80061D Age-related osteoporosis with current pathological fracture, right lower leg, subsequent encounter for fracture with routine healing: Secondary | ICD-10-CM | POA: Diagnosis not present

## 2019-11-28 DIAGNOSIS — I5032 Chronic diastolic (congestive) heart failure: Secondary | ICD-10-CM | POA: Diagnosis not present

## 2019-11-28 DIAGNOSIS — I130 Hypertensive heart and chronic kidney disease with heart failure and stage 1 through stage 4 chronic kidney disease, or unspecified chronic kidney disease: Secondary | ICD-10-CM | POA: Diagnosis not present

## 2019-11-28 DIAGNOSIS — J690 Pneumonitis due to inhalation of food and vomit: Secondary | ICD-10-CM | POA: Diagnosis not present

## 2019-11-28 DIAGNOSIS — K3189 Other diseases of stomach and duodenum: Secondary | ICD-10-CM | POA: Diagnosis not present

## 2019-11-29 ENCOUNTER — Other Ambulatory Visit: Payer: Self-pay | Admitting: Family Medicine

## 2019-12-05 ENCOUNTER — Other Ambulatory Visit: Payer: Self-pay

## 2019-12-05 ENCOUNTER — Ambulatory Visit: Payer: Medicare Other

## 2019-12-05 DIAGNOSIS — K3189 Other diseases of stomach and duodenum: Secondary | ICD-10-CM | POA: Diagnosis not present

## 2019-12-05 DIAGNOSIS — J690 Pneumonitis due to inhalation of food and vomit: Secondary | ICD-10-CM | POA: Diagnosis not present

## 2019-12-05 DIAGNOSIS — R1310 Dysphagia, unspecified: Secondary | ICD-10-CM | POA: Diagnosis not present

## 2019-12-05 DIAGNOSIS — M80061D Age-related osteoporosis with current pathological fracture, right lower leg, subsequent encounter for fracture with routine healing: Secondary | ICD-10-CM | POA: Diagnosis not present

## 2019-12-05 DIAGNOSIS — I5032 Chronic diastolic (congestive) heart failure: Secondary | ICD-10-CM | POA: Diagnosis not present

## 2019-12-05 DIAGNOSIS — I130 Hypertensive heart and chronic kidney disease with heart failure and stage 1 through stage 4 chronic kidney disease, or unspecified chronic kidney disease: Secondary | ICD-10-CM | POA: Diagnosis not present

## 2019-12-05 NOTE — Patient Instructions (Signed)
Visit Information  Goals Addressed              This Visit's Progress   .  I need silicon pads for my back (pt-stated)        CARE PLAN ENTRY (see longitudinal plan of care for additional care plan information)  Current Barriers:  . Care Coordination needs related to DME in a patient with Malnutrition, frailty and Unintentional weight loss.  Nurse Case Manager Clinical Goal(s):  Marland Kitchen Over the next 14 days, patient will verbalize understanding of plan for silicon adhesive pads  Interventions:  . Inter-disciplinary care team collaboration (see longitudinal plan of care) . Evaluation of current treatment plan related to silicon pads and patient's adherence to plan as established by provider. . Advised patient advised the patient that I would contact [employer] to find out any information on the silicon pads . Discussed plans with patient for ongoing care management follow up and provided patient with direct contact information for care management team . Daughter states that due to her mother's fraile nature she uses the pads to put on her coccyx to help deter any skin breakdown. 12/05/19 . Spoke with [name] today to let her know that Dr. McDiarmid had written out anew prescription for the new silicon pads that she wanted.  I told her that it will be up at the front in an envelope with her mother's name on it.  She verbalized understanding and stated that she or her husband will try to come by the office to day and pick it up. . She states that her mother is doing the same.  She is stable.   . Patient/daughter verbalizes understanding of plan  . Attends all scheduled provider appointments . Calls provider office for new concerns or questions . Unable to independently self manage self navigation for DME  Please see past updates related to this goal by clicking on the "Past Updates" button in the selected goal         Ms. Mace was given information about Care Management services  today including:  1. Care Management services include personalized support from designated clinical staff supervised by her physician, including individualized plan of care and coordination with other care providers 2. 24/7 contact phone numbers for assistance for urgent and routine care needs. 3. The patient may stop CCM services at any time (effective at the end of the month) by phone call to the office staff.  Patient agreed to services and verbal consent obtained.   The patient verbalized understanding of instructions provided today and declined a print copy of patient instruction materials.   The care management team will reach out to the patient again over the next 14 days.   Lazaro Arms RN, BSN, [facility] Care Management Coordinator [place] Phone: [phone] Fax: [phone]

## 2019-12-05 NOTE — Chronic Care Management (AMB) (Signed)
  Care Management   Follow Up Note   12/05/2019 Name: ATHANASIA STANWOOD MRN: [id number] DOB: 04/20/1920  Referred by: McDiarmid, Blane Ohara, MD Reason for referral : Appointment (Silion pads)   NYLAH BUTKUS is a 86 y.o. year old female who is a primary care patient of McDiarmid, Blane Ohara, MD. The care management team was consulted for assistance with care management and care coordination needs.    Review of patient status, including review of consultants reports, relevant laboratory and other test results, and collaboration with appropriate care team members and the patient's provider was performed as part of comprehensive patient evaluation and provision of chronic care management services.    SDOH (Social Determinants of Health) assessments performed: No See Care Plan activities for detailed interventions related to [facility])     Advanced Directives: See Care Plan and Vynca application for related entries.   Goals Addressed              This Visit's Progress   .  I need silicon pads for my back (pt-stated)        CARE PLAN ENTRY (see longitudinal plan of care for additional care plan information)  Current Barriers:  . Care Coordination needs related to DME in a patient with Malnutrition, frailty and Unintentional weight loss.  Nurse Case Manager Clinical Goal(s):  Marland Kitchen Over the next 14 days, patient will verbalize understanding of plan for silicon adhesive pads  Interventions:  . Inter-disciplinary care team collaboration (see longitudinal plan of care) . Evaluation of current treatment plan related to silicon pads and patient's adherence to plan as established by provider. . Advised patient advised the patient that I would contact [employer] to find out any information on the silicon pads . Discussed plans with patient for ongoing care management follow up and provided patient with direct contact information for care management team . Daughter states that due to her mother's fraile nature she  uses the pads to put on her coccyx to help deter any skin breakdown. 12/05/19 . Spoke with [name] today to let her know that Dr. McDiarmid had written out anew prescription for the new silicon pads that she wanted.  I told her that it will be up at the front in an envelope with her mother's name on it.  She verbalized understanding and stated that she or her husband will try to come by the office to day and pick it up. . She states that her mother is doing the same.  She is stable.   . Patient/daughter verbalizes understanding of plan  . Attends all scheduled provider appointments . Calls provider office for new concerns or questions . Unable to independently self manage self navigation for DME  Please see past updates related to this goal by clicking on the "Past Updates" button in the selected goal          The care management team will reach out to the patient again over the next 14 days.   Lazaro Arms RN, BSN, [facility] Care Management Coordinator [place] Phone: [phone] Fax: [phone]

## 2019-12-12 DIAGNOSIS — K3189 Other diseases of stomach and duodenum: Secondary | ICD-10-CM | POA: Diagnosis not present

## 2019-12-12 DIAGNOSIS — R1310 Dysphagia, unspecified: Secondary | ICD-10-CM | POA: Diagnosis not present

## 2019-12-12 DIAGNOSIS — J690 Pneumonitis due to inhalation of food and vomit: Secondary | ICD-10-CM | POA: Diagnosis not present

## 2019-12-12 DIAGNOSIS — I5032 Chronic diastolic (congestive) heart failure: Secondary | ICD-10-CM | POA: Diagnosis not present

## 2019-12-12 DIAGNOSIS — M80061D Age-related osteoporosis with current pathological fracture, right lower leg, subsequent encounter for fracture with routine healing: Secondary | ICD-10-CM | POA: Diagnosis not present

## 2019-12-12 DIAGNOSIS — I130 Hypertensive heart and chronic kidney disease with heart failure and stage 1 through stage 4 chronic kidney disease, or unspecified chronic kidney disease: Secondary | ICD-10-CM | POA: Diagnosis not present

## 2019-12-18 ENCOUNTER — Ambulatory Visit: Payer: Medicare Other | Admitting: Cardiology

## 2019-12-19 DIAGNOSIS — I5032 Chronic diastolic (congestive) heart failure: Secondary | ICD-10-CM | POA: Diagnosis not present

## 2019-12-19 DIAGNOSIS — K3189 Other diseases of stomach and duodenum: Secondary | ICD-10-CM | POA: Diagnosis not present

## 2019-12-19 DIAGNOSIS — I130 Hypertensive heart and chronic kidney disease with heart failure and stage 1 through stage 4 chronic kidney disease, or unspecified chronic kidney disease: Secondary | ICD-10-CM | POA: Diagnosis not present

## 2019-12-19 DIAGNOSIS — J690 Pneumonitis due to inhalation of food and vomit: Secondary | ICD-10-CM | POA: Diagnosis not present

## 2019-12-19 DIAGNOSIS — R1310 Dysphagia, unspecified: Secondary | ICD-10-CM | POA: Diagnosis not present

## 2019-12-19 DIAGNOSIS — M80061D Age-related osteoporosis with current pathological fracture, right lower leg, subsequent encounter for fracture with routine healing: Secondary | ICD-10-CM | POA: Diagnosis not present

## 2019-12-20 ENCOUNTER — Other Ambulatory Visit: Payer: Self-pay

## 2019-12-20 ENCOUNTER — Telehealth: Payer: Self-pay

## 2019-12-20 ENCOUNTER — Ambulatory Visit: Payer: Medicare Other

## 2019-12-20 NOTE — Telephone Encounter (Signed)
Claiborne Billings, [place] PT, LVM on nurse line requesting verbal orders to extend [facility] PT as follows.   1x a week for 9 weeks. Verbal order given to [facility] via VM.   Claiborne Billings also reported another possible UTI in patient. Claiborne Billings reported the patient was given an antibiotic, however is refusing to take. Patient does have an apt with PCP next week. I attempted to call Claiborne Billings back to get more information, however had to LVM.

## 2019-12-21 NOTE — Chronic Care Management (AMB) (Signed)
  Care Management   Follow Up Note   12/21/2019 Name: Jordan [name] MRN: [id number] DOB: April 05, 1920  Referred by: [name], Jordan Ohara, MD Reason for referral : Appointment (Silicon pads)   Jordan [name] is a 86 y.o. year old female who is a primary care patient of [name], Jordan Ohara, MD. The care management team was consulted for assistance with care management and care coordination needs.    Review of patient status, including review of consultants reports, relevant laboratory and other test results, and collaboration with appropriate care team members and the patient's provider was performed as part of comprehensive patient evaluation and provision of chronic care management services.    SDOH (Social Determinants of Health) assessments performed: No See Care Plan activities for detailed interventions related to [facility])     Advanced Directives: See Care Plan and Vynca application for related entries.   Goals Addressed              This Visit's Progress   .  I need silicon pads for my back (pt-stated)        CARE PLAN ENTRY (see longitudinal plan of care for additional care plan information)  Current Barriers:  . Care Coordination needs related to DME in a patient with Malnutrition, frailty and Unintentional weight loss.  Nurse Case Manager Clinical Goal(s):  Marland Kitchen Over the next 14 days, patient will verbalize understanding of plan for silicon adhesive pads  Interventions:  . Inter-disciplinary care team collaboration (see longitudinal plan of care) . Evaluation of current treatment plan related to silicon pads and patient's adherence to plan as established by provider. . Advised patient advised the patient that I would contact [employer] to find out any information on the silicon pads . Discussed plans with patient for ongoing care management follow up and provided patient with direct contact information for care management team . Daughter states that due to her mother's fraile nature  she uses the pads to put on her coccyx to help deter any skin breakdown. 12/20/19 . Spoke with Mrs Jordan [name] today and she has picked up the new prescription for the pads. . She thinks that her mother is starting another UTI so she has started her on antibiotics.  . She states that her therapist has spoken with her about making a referral for palliative care. . She states that she no longer has help in the home with her mother and is looking for help.  RNCM will talk with Jordan [name] to see if her CAPs worker can help with finding her new help in the home.   . Patient/daughter verbalizes understanding of plan  . Attends all scheduled provider appointments . Calls provider office for new concerns or questions . Unable to independently self manage self navigation for DME  Please see past updates related to this goal by clicking on the "Past Updates" button in the selected goal          The care management team will reach out to the patient again over the next 14 days.   Lazaro Arms RN, BSN, [facility] Care Management Coordinator [place] Phone: [phone] Fax: [phone]

## 2019-12-21 NOTE — Patient Instructions (Signed)
Visit Information  Goals Addressed              This Visit's Progress   .  I need silicon pads for my back (pt-stated)        CARE PLAN ENTRY (see longitudinal plan of care for additional care plan information)  Current Barriers:  . Care Coordination needs related to DME in a patient with Malnutrition, frailty and Unintentional weight loss.  Nurse Case Manager Clinical Goal(s):  Marland Kitchen Over the next 14 days, patient will verbalize understanding of plan for silicon adhesive pads  Interventions:  . Inter-disciplinary care team collaboration (see longitudinal plan of care) . Evaluation of current treatment plan related to silicon pads and patient's adherence to plan as established by provider. . Advised patient advised the patient that I would contact [facility] to find out any information on the silicon pads . Discussed plans with patient for ongoing care management follow up and provided patient with direct contact information for care management team . Daughter states that due to her mother's fraile nature she uses the pads to put on her coccyx to help deter any skin breakdown. 12/20/19 . Spoke with [name] today and she has picked up the new prescription for the pads. . She thinks that her mother is starting another UTI so she has started her on antibiotics.  . She states that her therapist has spoken with her about making a referral for palliative care. . She states that she no longer has help in the home with her mother and is looking for help.  RNCM will talk with [place] to see if her CAPs worker can help with finding her new help in the home.   . Patient/daughter verbalizes understanding of plan  . Attends all scheduled provider appointments . Calls provider office for new concerns or questions . Unable to independently self manage self navigation for DME  Please see past updates related to this goal by clicking on the "Past Updates" button in the selected goal          Ms. Pelster was given information about Care Management services today including:  1. Care Management services include personalized support from designated clinical staff supervised by her physician, including individualized plan of care and coordination with other care providers 2. 24/7 contact phone numbers for assistance for urgent and routine care needs. 3. The patient may stop CCM services at any time (effective at the end of the month) by phone call to the office staff.  Patient agreed to services and verbal consent obtained.   The patient verbalized understanding of instructions provided today and declined a print copy of patient instruction materials.   The care management team will reach out to the patient again over the next 14 days.   Lazaro Arms RN, BSN, [facility] Care Management Coordinator [place] Phone: [phone] Fax: [phone]

## 2019-12-24 DIAGNOSIS — J690 Pneumonitis due to inhalation of food and vomit: Secondary | ICD-10-CM | POA: Diagnosis not present

## 2019-12-24 DIAGNOSIS — M80061D Age-related osteoporosis with current pathological fracture, right lower leg, subsequent encounter for fracture with routine healing: Secondary | ICD-10-CM | POA: Diagnosis not present

## 2019-12-24 DIAGNOSIS — R1310 Dysphagia, unspecified: Secondary | ICD-10-CM | POA: Diagnosis not present

## 2019-12-24 DIAGNOSIS — K3189 Other diseases of stomach and duodenum: Secondary | ICD-10-CM | POA: Diagnosis not present

## 2019-12-24 DIAGNOSIS — I130 Hypertensive heart and chronic kidney disease with heart failure and stage 1 through stage 4 chronic kidney disease, or unspecified chronic kidney disease: Secondary | ICD-10-CM | POA: Diagnosis not present

## 2019-12-24 DIAGNOSIS — I5032 Chronic diastolic (congestive) heart failure: Secondary | ICD-10-CM | POA: Diagnosis not present

## 2019-12-25 ENCOUNTER — Ambulatory Visit: Payer: Medicare Other | Admitting: Cardiology

## 2019-12-25 ENCOUNTER — Other Ambulatory Visit: Payer: Self-pay | Admitting: Cardiology

## 2019-12-26 ENCOUNTER — Telehealth: Payer: Self-pay

## 2019-12-26 ENCOUNTER — Telehealth: Payer: Medicare Other | Admitting: Family Medicine

## 2019-12-26 DIAGNOSIS — G894 Chronic pain syndrome: Secondary | ICD-10-CM | POA: Diagnosis not present

## 2019-12-26 DIAGNOSIS — Z79891 Long term (current) use of opiate analgesic: Secondary | ICD-10-CM | POA: Diagnosis not present

## 2019-12-26 DIAGNOSIS — M48061 Spinal stenosis, lumbar region without neurogenic claudication: Secondary | ICD-10-CM | POA: Diagnosis not present

## 2019-12-26 DIAGNOSIS — M1030 Gout due to renal impairment, unspecified site: Secondary | ICD-10-CM | POA: Diagnosis not present

## 2019-12-26 DIAGNOSIS — I5032 Chronic diastolic (congestive) heart failure: Secondary | ICD-10-CM | POA: Diagnosis not present

## 2019-12-26 DIAGNOSIS — I442 Atrioventricular block, complete: Secondary | ICD-10-CM | POA: Diagnosis not present

## 2019-12-26 DIAGNOSIS — D696 Thrombocytopenia, unspecified: Secondary | ICD-10-CM | POA: Diagnosis not present

## 2019-12-26 DIAGNOSIS — K3189 Other diseases of stomach and duodenum: Secondary | ICD-10-CM | POA: Diagnosis not present

## 2019-12-26 DIAGNOSIS — R1310 Dysphagia, unspecified: Secondary | ICD-10-CM | POA: Diagnosis not present

## 2019-12-26 DIAGNOSIS — F0280 Dementia in other diseases classified elsewhere without behavioral disturbance: Secondary | ICD-10-CM | POA: Diagnosis not present

## 2019-12-26 DIAGNOSIS — I130 Hypertensive heart and chronic kidney disease with heart failure and stage 1 through stage 4 chronic kidney disease, or unspecified chronic kidney disease: Secondary | ICD-10-CM | POA: Diagnosis not present

## 2019-12-26 DIAGNOSIS — Z950 Presence of cardiac pacemaker: Secondary | ICD-10-CM | POA: Diagnosis not present

## 2019-12-26 DIAGNOSIS — M80061D Age-related osteoporosis with current pathological fracture, right lower leg, subsequent encounter for fracture with routine healing: Secondary | ICD-10-CM | POA: Diagnosis not present

## 2019-12-26 DIAGNOSIS — Z9181 History of falling: Secondary | ICD-10-CM | POA: Diagnosis not present

## 2019-12-26 DIAGNOSIS — N1832 Chronic kidney disease, stage 3b: Secondary | ICD-10-CM | POA: Diagnosis not present

## 2019-12-26 DIAGNOSIS — J690 Pneumonitis due to inhalation of food and vomit: Secondary | ICD-10-CM | POA: Diagnosis not present

## 2019-12-26 DIAGNOSIS — D631 Anemia in chronic kidney disease: Secondary | ICD-10-CM | POA: Diagnosis not present

## 2019-12-26 DIAGNOSIS — Z7901 Long term (current) use of anticoagulants: Secondary | ICD-10-CM | POA: Diagnosis not present

## 2019-12-26 DIAGNOSIS — E039 Hypothyroidism, unspecified: Secondary | ICD-10-CM | POA: Diagnosis not present

## 2019-12-26 DIAGNOSIS — M1990 Unspecified osteoarthritis, unspecified site: Secondary | ICD-10-CM | POA: Diagnosis not present

## 2019-12-26 DIAGNOSIS — I4891 Unspecified atrial fibrillation: Secondary | ICD-10-CM | POA: Diagnosis not present

## 2019-12-26 DIAGNOSIS — K219 Gastro-esophageal reflux disease without esophagitis: Secondary | ICD-10-CM | POA: Diagnosis not present

## 2019-12-26 DIAGNOSIS — G309 Alzheimer's disease, unspecified: Secondary | ICD-10-CM | POA: Diagnosis not present

## 2019-12-26 DIAGNOSIS — M353 Polymyalgia rheumatica: Secondary | ICD-10-CM | POA: Diagnosis not present

## 2019-12-26 DIAGNOSIS — E785 Hyperlipidemia, unspecified: Secondary | ICD-10-CM | POA: Diagnosis not present

## 2019-12-26 NOTE — Telephone Encounter (Signed)
Jordan [name], with [facility], calls nurse line requesting verbal orders to start palliative care.   Verbal orders given to [facility] per [facility] protocol.

## 2019-12-27 ENCOUNTER — Telehealth: Payer: Self-pay

## 2019-12-27 DIAGNOSIS — M80061D Age-related osteoporosis with current pathological fracture, right lower leg, subsequent encounter for fracture with routine healing: Secondary | ICD-10-CM | POA: Diagnosis not present

## 2019-12-27 DIAGNOSIS — R1310 Dysphagia, unspecified: Secondary | ICD-10-CM | POA: Diagnosis not present

## 2019-12-27 DIAGNOSIS — K3189 Other diseases of stomach and duodenum: Secondary | ICD-10-CM | POA: Diagnosis not present

## 2019-12-27 DIAGNOSIS — I130 Hypertensive heart and chronic kidney disease with heart failure and stage 1 through stage 4 chronic kidney disease, or unspecified chronic kidney disease: Secondary | ICD-10-CM | POA: Diagnosis not present

## 2019-12-27 DIAGNOSIS — G894 Chronic pain syndrome: Secondary | ICD-10-CM | POA: Diagnosis not present

## 2019-12-27 DIAGNOSIS — J690 Pneumonitis due to inhalation of food and vomit: Secondary | ICD-10-CM | POA: Diagnosis not present

## 2019-12-27 NOTE — Telephone Encounter (Signed)
Telephone call to patient to schedule palliative care visit with patient. Patient/family in agreement with home visit on 01-09-20 at 1:00PM

## 2019-12-30 ENCOUNTER — Telehealth: Payer: Medicare Other

## 2019-12-30 ENCOUNTER — Telehealth: Payer: Self-pay

## 2019-12-30 NOTE — Telephone Encounter (Signed)
  Care Management   Outreach Note  12/30/2019 Name: Jordan [name] MRN: [id number] DOB: 1920/01/01  Referred by: McDiarmid, Blane Ohara, MD Reason for referral : Appointment (follow up)   An unsuccessful telephone outreach was attempted today. The patient was referred to the case management team for assistance with care management and care coordination.   Follow Up Plan: A HIPPA compliant phone message was left for the patient providing contact information and requesting a return call.  The care management team will reach out to the patient again over the next 7-14 days.   Lazaro Arms RN, BSN, [facility] Care Management Coordinator [place] Phone: [phone] Fax: [phone]

## 2019-12-30 NOTE — Telephone Encounter (Deleted)
  Care Management   Outreach Note  12/30/2019 Name: Jordan [name] MRN: [id number] DOB: Jun 25, 1919  Referred by: McDiarmid, Blane Ohara, MD Reason for referral : No chief complaint on file.   An unsuccessful telephone outreach was attempted today. The patient was referred to the case management team for assistance with care management and care coordination.   Follow Up Plan: The care management team will reach out to the patient again over the next 7-14 days.   Lazaro Arms RN, BSN, [facility] Care Management Coordinator [place] Phone: [phone] Fax: [phone]

## 2019-12-31 DIAGNOSIS — I130 Hypertensive heart and chronic kidney disease with heart failure and stage 1 through stage 4 chronic kidney disease, or unspecified chronic kidney disease: Secondary | ICD-10-CM | POA: Diagnosis not present

## 2019-12-31 DIAGNOSIS — R1310 Dysphagia, unspecified: Secondary | ICD-10-CM | POA: Diagnosis not present

## 2019-12-31 DIAGNOSIS — M80061D Age-related osteoporosis with current pathological fracture, right lower leg, subsequent encounter for fracture with routine healing: Secondary | ICD-10-CM | POA: Diagnosis not present

## 2019-12-31 DIAGNOSIS — G894 Chronic pain syndrome: Secondary | ICD-10-CM | POA: Diagnosis not present

## 2019-12-31 DIAGNOSIS — K3189 Other diseases of stomach and duodenum: Secondary | ICD-10-CM | POA: Diagnosis not present

## 2019-12-31 DIAGNOSIS — J690 Pneumonitis due to inhalation of food and vomit: Secondary | ICD-10-CM | POA: Diagnosis not present

## 2020-01-01 DIAGNOSIS — H401134 Primary open-angle glaucoma, bilateral, indeterminate stage: Secondary | ICD-10-CM | POA: Diagnosis not present

## 2020-01-01 DIAGNOSIS — H524 Presbyopia: Secondary | ICD-10-CM | POA: Diagnosis not present

## 2020-01-01 DIAGNOSIS — H5203 Hypermetropia, bilateral: Secondary | ICD-10-CM | POA: Diagnosis not present

## 2020-01-01 DIAGNOSIS — H353132 Nonexudative age-related macular degeneration, bilateral, intermediate dry stage: Secondary | ICD-10-CM | POA: Diagnosis not present

## 2020-01-01 DIAGNOSIS — H52203 Unspecified astigmatism, bilateral: Secondary | ICD-10-CM | POA: Diagnosis not present

## 2020-01-01 DIAGNOSIS — Z961 Presence of intraocular lens: Secondary | ICD-10-CM | POA: Diagnosis not present

## 2020-01-07 ENCOUNTER — Other Ambulatory Visit: Payer: Self-pay | Admitting: Cardiology

## 2020-01-07 ENCOUNTER — Other Ambulatory Visit: Payer: Self-pay | Admitting: Family Medicine

## 2020-01-07 DIAGNOSIS — M5489 Other dorsalgia: Secondary | ICD-10-CM

## 2020-01-08 ENCOUNTER — Encounter: Payer: Self-pay | Admitting: Cardiology

## 2020-01-08 ENCOUNTER — Other Ambulatory Visit: Payer: Self-pay

## 2020-01-08 ENCOUNTER — Ambulatory Visit: Payer: Medicare Other | Admitting: Cardiology

## 2020-01-08 VITALS — BP 141/46 | HR 75 | Resp 16 | Ht <= 58 in | Wt 95.3 lb

## 2020-01-08 DIAGNOSIS — I480 Paroxysmal atrial fibrillation: Secondary | ICD-10-CM

## 2020-01-08 DIAGNOSIS — I442 Atrioventricular block, complete: Secondary | ICD-10-CM | POA: Diagnosis not present

## 2020-01-08 DIAGNOSIS — R5381 Other malaise: Secondary | ICD-10-CM

## 2020-01-08 DIAGNOSIS — Z950 Presence of cardiac pacemaker: Secondary | ICD-10-CM | POA: Diagnosis not present

## 2020-01-08 DIAGNOSIS — R0609 Other forms of dyspnea: Secondary | ICD-10-CM

## 2020-01-08 DIAGNOSIS — R5383 Other fatigue: Secondary | ICD-10-CM | POA: Diagnosis not present

## 2020-01-08 DIAGNOSIS — R0600 Dyspnea, unspecified: Secondary | ICD-10-CM | POA: Diagnosis not present

## 2020-01-08 NOTE — Progress Notes (Signed)
Primary Physician/Referring:  McDiarmid, Blane Ohara, MD  Patient ID: Jordan [name], female    DOB: 1920/04/22, 86 y.o.   MRN: [id number]  Chief Complaint  Patient presents with  . Heartblock  . Follow-up    1 year   HPI:    Jordan [name]  is a 86 y.o. female  with h/o complete heart block, sick sinus syndrome S/P pacemaker implantation with Medtronic pacemaker on 09/24/2013, pacemaker dependant, PAF with prolonged sustained atrial fibrillation with RVR on 09/16/2015 for 2 hours and 32 minutes and no further recurrence and on anticoagulation with Eliquis.   Her past medical history also includes history of esophageal stricture SP dilatation in 2017, recurrent UTI, hyperlipidemia and hypertension.   She continues to complain of chronic dyspnea on exertion and also chronic continued.  Daughter states that she has been slightly withdrawn lately and considering palliative care.  Past Medical History:  Diagnosis Date  . Acute gout of right knee 09/14/2018   Resolved. Continue allopurinol and cochicine prophylaxis  . Acute hip pain, left 06/29/2018  . Acute pancreatitis 04/05/2018  . AKI (acute kidney injury) ([place])   . Alzheimer's dementia [facility]) 07/30/2013   04/17/14 MoCA - Blind (Spanish version); 9 out of 22.  Correct MoCA = 12 out of 30.    . Arthritis   . Atherosclerosis of aorta ([place]) 10/05/2015  . Back pain 10/05/2015  . Bronchiectasis ([place]) 10/03/2017  . Calcification of native coronary artery 10/05/2015   Chest CT 2011.   . Carpal tunnel syndrome 06/29/2006   Qualifier: Diagnosis of  By: Benna Dunks    . Cervicalgia 05/18/2009   Qualifier: Diagnosis of  By: Walker Kehr MD, Patrick Jupiter    . CHF (congestive heart failure) ([place])   . Chronic bilateral thoracic back pain 10/05/2015  . Chronic constipation   . Chronic diastolic heart failure ([place]) 09/01/2013  . Chronic pain syndrome 05/09/2014  . CKD (chronic kidney disease) stage 3, GFR 30-59 ml/min 09/03/2013  . Collapsed vertebra due to osteoporosis ([place]) 10/06/2015    Multiple thoracolumbar vertebral body collapses Chest CT 2018  . Combined visual hearing impairment 11/12/2013  . Complete heart block ([place])   . Complete immobility due to severe physical disability or frailty ([place]) 05/30/2014  . Constipation   . Cryptogenic stroke ([place]) S/P  LOOP RECORDER IMPLANT   DX  01/2013  --  SYNCOPAL EPISODE --  EPISODIC  ATRIAL TACHY/ FIBULATION  AND PAUSES  . CYSTOCELE/RECTOCELE/PROLAPSE,UNSPEC. 06/29/2006   Qualifier: Diagnosis of  By: Benna Dunks    . Depressive disorder 06/29/2006       . Dilation of esophagus 06/16/2017   Barium Esophagram 06/25/17: Poor esophageal motility. Dilated esophagus with tertiary contractions.  Smooth narrowing distal esophagus similar to the prior study.  Moderate narrowing. Barium tablet passed through this area after several sips of water.  Marland Kitchen Displaced fracture of fifth metatarsal bone, right foot, subsequent encounter for fracture with routine healing 06/05/2017  . Diverticulosis of colon   . DIVERTICULOSIS OF COLON 06/29/2006   Qualifier: Diagnosis of  By: Benna Dunks    . Dry eyes   . Dysphagia 09/15/2015  . Encounter for care of pacemaker   . Fall from bed 03/17/2016  . Fatigue   . Food impaction of esophagus   . Frequency of urination   . Gait instability   . Generalized osteoarthritis of multiple sites 06/15/2017  . Glaucoma 04/18/2014  . Glaucoma of both eyes   . Gout 09/21/2013  . Hand  Heaviness 03/17/2016  . Headache   . Hearing loss of aging 05/09/2014  . HERNIA, INCISIONAL VENTRAL W/O OBST/GNGR 10/25/2006   Has been seen by surgery in the past and told that this is not dangerous and should only be treated particularly bothersome    . History of acute gouty arthritis 09/21/2013  . History of colon polyps   . History of Food impaction of esophagus   . HTN (hypertension) 06/29/2006   Isolated Systolic Hypertension - Ambulatory BP  Monitoring result 4/28-29/2015   . Hyperlipidemia 02/27/2015  . Hyperplastic colon polyp 2006     colonoscope  . Hypertension   . Hypochloremia   . Hypoproteinemia ([place]) 09/14/2018  . Hypothyroidism   . Impaired mobility and ADLs 05/30/2014  . Influenza A 05/14/2016  . Insomnia   . INTERSTITIAL CYSTITIS 09/21/2009   Qualifier: Diagnosis of  By: Sarita Haver  MD, Coralyn Mark    . Loss of appetite 09/14/2018  . Loss of weight 04/05/2010   Qualifier: Diagnosis of  By: Walker Kehr MD, Patrick Jupiter    . Lumbar stenosis   . Macular degeneration, age related, nonexudative 04/18/2014  . Mass of soft tissue of upper arm 10/18/2018  . MASS, LUNG 04/05/2010   Qualifier: Diagnosis of  By: Walker Kehr MD, Patrick Jupiter    . Mobitz type 2 second degree heart block 08/29/2013  . Moderate dementia ([place])   . Narrowing of Distal Esophagus 06/16/2017   Barium Esophagram (06/15/17) Poor esophageal motility. Dilated esophagus with tertiary contractions.  Smooth narrowing distal esophagus similar to the prior study.  Moderate narrowing. Barium tablet passed through this area after several sips of water.   . OA (osteoarthritis)    RIGHT SHOULDER AC JOINT  . Orthostasis 04/28/2016  . OSTEOARTHRITIS, MULTI SITES 06/29/2006   Qualifier: Diagnosis of  By: Benna Dunks    . Osteoporosis   . OSTEOPOROSIS 03/20/2008   Qualifier: Diagnosis of  By: Jerline Pain MD, Anderson Malta    . 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 Peripheral neuropathy   . PERIPHERAL NEUROPATHY 03/29/2010   Qualifier: Diagnosis of  By: Walker Kehr MD, Patrick Jupiter    . Persistent headaches 09/06/2013  . Polymyalgia rheumatica ([place]) 11/08/2013  . Presbyesophagus (senile esophageal dysmotility) 06/16/2017  . Presence of permanent cardiac pacemaker   . Pressure ulcer of right posterior thigh, unspecified pressure ulcer stage 08/26/2019  . Rib pain on left side 10/05/2015  . Right knee pain 01/15/2014  . Right rotator cuff tear   . Rotator cuff tear arthropathy 09/11/2012   Likely related to rotator cuff  tendinopathy seen on musculoskeletal ultrasound, and GH DJD.  Complete tear of right supra and infraspinatus on MRI 10/2012     . S/P shoulder replacement 03/20/2015  . Shortness of breath dyspnea   . Spinal stenosis of lumbar region 10/03/2012   Patient has significant low back pain related to spinal stenosis and DJD, DDD.  We had a long discussion about treatment options which are quite limited. She is likely not a good surgical candidate for laminectomy. I am not sure about the usefulness of epidural steroid injection. We'll plan on restarting meloxicam after a long discussion of the pros and cons and risks of chronic kidney disease.  The patient and her family feel that the benefit of good pain control is the small risk.  Additionally she will followup with her primary care provider for creatinine monitoring.  Will obtain physical therapy  for TENS unit trial.  Will refer to pain management for consideration of epidural steroid injection as that worked well in the past for cervical pain Followup here as needed   . Syncope and collapse 02/09/2013  . Urinary frequency 06/05/2015  . Urinary incontinence, mixed 06/29/2006   Qualifier: Diagnosis of  By: Benna Dunks    . UTI (urinary tract infection) 05/27/2016  . UTI (urinary tract infection) 05/2017  . Vertigo 10/18/2010  . Weakness 08/29/2013   Past Surgical History:  Procedure Laterality Date  . CARPAL TUNNEL RELEASE Right   . ESOPHAGOGASTRODUODENOSCOPY N/A 09/15/2015   Procedure: ESOPHAGOGASTRODUODENOSCOPY (EGD);  Surgeon: Clarene Essex, MD;  Location: [facility] ENDOSCOPY;  Service: Endoscopy;  Laterality: N/A;  . ESOPHAGOGASTRODUODENOSCOPY (EGD) WITH PROPOFOL N/A 10/21/2019   Procedure: ESOPHAGOGASTRODUODENOSCOPY (EGD) WITH PROPOFOL;  Surgeon: Wonda Horner, MD;  Location: [facility] ENDOSCOPY;  Service: Endoscopy;  Laterality: N/A;  . EYE SURGERY     cataract, bilat.  Randolm Idol / REPLACE / REMOVE PACEMAKER    . LOOP RECORDER IMPLANT  02-08-13; 08-30-13   MDT LinQ  implanted by Dr Lovena Le for syncope, explanted 08-30-13 after CHB identified  . LOOP RECORDER IMPLANT N/A 02/11/2013   Procedure: LOOP RECORDER IMPLANT;  Surgeon: Evans Lance, MD;  Location: [facility] CATH LAB;  Service: Cardiovascular;  Laterality: N/A;  . PACEMAKER INSERTION  08-30-2013   MDT [id number] pacemaker implanted by Dr Rayann Heman for CHB  . PERMANENT PACEMAKER INSERTION N/A 08/30/2013   Procedure: PERMANENT PACEMAKER INSERTION;  Surgeon: Coralyn Mark, MD;  Location: [facility] CATH LAB;  Service: Cardiovascular;  Laterality: N/A;  . REVERSE SHOULDER ARTHROPLASTY Right 03/20/2015   Procedure: RIGHT REVERSE SHOULDER ARTHROPLASTY;  Surgeon: Netta Cedars, MD;  Location: [place];  Service: Orthopedics;  Laterality: Right;  . TOTAL ABDOMINAL HYSTERECTOMY W/ BILATERAL SALPINGOOPHORECTOMY  03-15-2005   Family History  Problem Relation Age of Onset  . Kidney disease Mother   . Heart attack Neg Hx   . Stroke Neg Hx     Social History   Tobacco Use  . Smoking status: Never Smoker  . Smokeless tobacco: Never Used  Substance Use Topics  . Alcohol use: No    Alcohol/week: 0.0 standard drinks   Marital Status: Widowed   ROS  Review of Systems  Constitutional: Positive for malaise/fatigue (Chronic, stable).  Cardiovascular: Positive for dyspnea on exertion.  Musculoskeletal: Positive for back pain (chronic ).   Objective  Blood pressure (!) 141/46, pulse 75, resp. rate 16, height 4\' 4"  (1.321 m), weight 95 lb 4.8 oz (43.2 kg), SpO2 93 %.  Vitals with BMI 01/08/2020 10/23/2019 10/23/2019  Height 4\' 4"  - -  Weight 95 lbs 5 oz - -  BMI 50.53 - -  Systolic 976 734 193  Diastolic 46 47 48  Pulse 75 59 -     Physical Exam Constitutional:      General: She is not in acute distress.    Comments: Short stature, frail  HENT:     Head: Atraumatic.  Cardiovascular:     Rate and Rhythm: Normal rate and regular rhythm.     Pulses: Intact distal pulses.     Heart sounds: S1 normal and S2 normal. No murmur heard.    No gallop.   Pulmonary:     Effort: Pulmonary effort is normal.     Breath sounds: Rales (Bilateral base) present.     Comments: Pacemaker pocket in the left infraclavicular region Musculoskeletal:     Right lower leg: No  edema.     Left lower leg: No edema.     Laboratory examination:   Recent Labs    10/22/19 0318 10/23/19 0328 11/20/19 1713  NA 141 138 134  K 3.7 3.6 4.5  CL 107 102 96  CO2 31 27 30*  GLUCOSE 93 99 102*  BUN 33* 32* 21  CREATININE 1.08* 1.18* 1.18*  CALCIUM 8.5* 8.2* 9.1  GFRNONAA 43* 38* 38*  GFRAA 49* 44* 44*   CrCl cannot be calculated (Patient's most recent lab result is older than the maximum 21 days allowed.).  CMP Latest Ref Rng & Units 11/20/2019 10/23/2019 10/22/2019  Glucose 65 - 99 mg/dL 102(H) 99 93  BUN 10 - 36 mg/dL 21 32(H) 33(H)  Creatinine 0.57 - 1.00 mg/dL 1.18(H) 1.18(H) 1.08(H)  Sodium 134 - 144 mmol/L 134 138 141  Potassium 3.5 - 5.2 mmol/L 4.5 3.6 3.7  Chloride 96 - 106 mmol/L 96 102 107  CO2 20 - 29 mmol/L 30(H) 27 31  Calcium 8.7 - 10.3 mg/dL 9.1 8.2(L) 8.5(L)  Total Protein 6.5 - 8.1 g/dL - 4.8(L) -  Total Bilirubin 0.3 - 1.2 mg/dL - 0.8 -  Alkaline Phos 38 - 126 U/L - 95 -  AST 15 - 41 U/L - 23 -  ALT 0 - 44 U/L - 16 -   CBC Latest Ref Rng & Units 11/20/2019 10/23/2019 10/22/2019  WBC 3.4 - 10.8 x10E3/uL 3.1(L) 4.9 5.0  Hemoglobin 11.1 - 15.9 g/dL 9.5(L) 8.7(L) 8.4(L)  Hematocrit 34.0 - 46.6 % 28.4(L) 26.3(L) 25.8(L)  Platelets 150 - 450 x10E3/uL 101(L) 87(L) 95(L)   TSH Recent Labs    10/19/19 0907 11/20/19 1713  TSH 0.073* 0.798   Medications and allergies   Allergies  Allergen Reactions  . Chlorthalidone Other (See Comments)    Hyponatremia  . Honey Bee Treatment [Bee Venom] Swelling and Rash  . Verapamil Other (See Comments)    Bradycardia - HR  35-45 on 40mg  TID  . Adhesive [Tape] Rash and Other (See Comments)    Skin irritation  . Amlodipine Other (See Comments)    Peripheral edema with 2.5mg  daily  dose     Outpatient Medications Prior to Visit  Medication Sig Dispense Refill  . acetaminophen (TYLENOL) 650 MG CR tablet Take 650 mg by mouth 2 (two) times daily as needed for pain.    Marland Kitchen allopurinol (ZYLOPRIM) 100 MG tablet TAKE 1 TABLET(100 MG) BY MOUTH DAILY (Patient taking differently: Take 100 mg by mouth daily. ) 90 tablet 3  . colchicine 0.6 MG tablet TAKE 1 TABLET BY MOUTH TWICE DAILY 240 tablet 3  . cycloSPORINE (RESTASIS) 0.05 % ophthalmic emulsion Place 1 drop into both eyes at bedtime.    . diclofenac Sodium (VOLTAREN) 1 % GEL Apply 2 g topically 4 (four) times daily.     Marland Kitchen docusate sodium (COLACE) 100 MG capsule Take 2 capsules (200 mg total) by mouth 2 (two) times daily as needed for mild constipation.    Marland Kitchen ELIQUIS 2.5 MG TABS tablet TAKE 1 TABLET(2.5 MG) BY MOUTH TWICE DAILY 60 tablet 0  . fesoterodine (TOVIAZ) 4 MG TB24 tablet Take 1 tablet (4 mg total) by mouth daily. 30 tablet PRN  . furosemide (LASIX) 20 MG tablet TAKE 1 TABLET BY MOUTH DAILY AS NEEDED FOR SWELLING 30 tablet 6  . gabapentin (NEURONTIN) 100 MG capsule TAKE 2 CAPSULES BY MOUTH TWICE DAILY 360 capsule 5  . levothyroxine (SYNTHROID) 100 MCG tablet TAKE 1 TABLET BY MOUTH  ONCE DAILY 90 tablet PRN  . lidocaine (XYLOCAINE) 5 % ointment APPLY 1 APPLICATION TOPICALLY DAILY AS NEEDED FOR BACK PAIN 150 g 2  . mirtazapine (REMERON) 15 MG tablet Take 1 tablet (15 mg total) by mouth at bedtime. 30 tablet 11  . Nutritional Supplements (FEEDING SUPPLEMENT, NEPRO CARB STEADY,) LIQD Take 237 mLs by mouth 2 (two) times daily between meals. 14220 mL 0  . omeprazole (PRILOSEC) 20 MG capsule TAKE 1 CAPSULE(20 MG) BY MOUTH DAILY (Patient taking differently: Take 20 mg by mouth daily. ) 90 capsule 3  . ondansetron (ZOFRAN ODT) 4 MG disintegrating tablet Take 1 tablet (4 mg total) by mouth every 8 (eight) hours as needed for nausea or vomiting. 20 tablet 0  . polyethylene glycol (MIRALAX / GLYCOLAX) 17 g packet Take 17 g by mouth  daily as needed for moderate constipation. take 17 grams by mouth once daily    . traMADol (ULTRAM) 50 MG tablet Take 1 tablet (50 mg total) by mouth every 6 (six) hours as needed for moderate pain or severe pain. 30 tablet 2  . TRAVATAN Z 0.004 % SOLN ophthalmic solution Place 1 drop into both eyes at bedtime.     . traZODone (DESYREL) 50 MG tablet TAKE 1 TABLET BY MOUTH AT BEDTIME 90 tablet 3  . Wound Dressings (ALLEVYN LIFE SACRUM) PADS Apply 1 Units topically as needed. 10 each PRN  . metoprolol succinate (TOPROL-XL) 25 MG 24 hr tablet Take 1 tablet (25 mg total) by mouth daily as needed (high blood pressure). (Patient not taking: Reported on 01/08/2020) 90 tablet 3  . beta carotene w/minerals (OCUVITE) tablet Take 1 tablet by mouth daily. 90 tablet 3   No facility-administered medications prior to visit.     Radiology:   No results found.  Cardiac Studies:   Echocardiogram 10/18/2019:  Left ventricular ejection fraction, by estimation, is 60 to 65%. The left ventricle has normal function. The left ventricle has no regional wall motion abnormalities. Indeterminate diastolic filling due to E-A fusion.  Right ventricular systolic function is normal. The right ventricular size is normal.  The mitral valve is normal in structure. No evidence of mitral valve regurgitation. No evidence of mitral stenosis.  The aortic valve was not well visualized. Aortic valve regurgitation is not visualized. No aortic stenosis is present.  Comparison to 07/29/2014: No significant change.  Echocardiogram [07/29/2014]: Normal LV systolic function LVEF 84-13%. Grade 1 diastolic dysfunction, mitral valve calcification.  Lower Extremity bilateral venous Dopplers [08/30/2013]:  - No evidence of deep vein thrombosis involving the left lower extremity. - No evidence of deep vein thrombosis involving the right common femoral vein.  Scheduled Remote pacemaker check 11/12/2019:  There were 86 atrial high  rate episodes detected. The longest lasted < 1 minutes in duration. No EGMs available for review. There was a <0.1 % cumulative atrial arrhythmia burden. There was 1 high ventricular rate episode detected. On 06/19/19 (15:41), there was an 11 beat episode of NSVT with a CL range of 406 - 335 ms. Battery longevity is 6 years. RA pacing is 14.2 %, RV pacing is 100 %.  EKG  EKG 01/08/2020: AV paced rhythm, no further analysis.     Assessment     ICD-10-CM   1. Paroxysmal atrial fibrillation (HCC) CHA2DS2-VASc Score is 6 with yearly risk of stroke of 9.8 %  I48.0 EKG 12-Lead  2. Complete heart block ([place]) S/P permanent pacemaker 2015  I44.2   3. Pacemaker Dual Chamber Medtronic Adapta  L ADDR  Z95.0   4. Dyspnea on exertion  R06.00 Pulse oximetry, overnight  5. Malaise and fatigue  R53.81 Pulse oximetry, overnight   R53.83      Medications Discontinued During This Encounter  Medication Reason  . beta carotene w/minerals (OCUVITE) tablet Patient Preference    No orders of the defined types were placed in this encounter.  No orders of the defined types were placed in this encounter.   Recommendations:   Jordan [name] is a 86 y.o. female  with h/o complete heart block, sick sinus syndrome S/P pacemaker implantation with Medtronic pacemaker on 09/24/2013, pacemaker dependant, PAF with prolonged sustained atrial fibrillation with RVR on 09/16/2015 for 2 hours and 32 minutes and no further recurrence and on anticoagulation with Eliquis.   Her past medical history also includes history of esophageal stricture SP dilatation in 2017, recurrent UTI, hyperlipidemia and hypertension.   She continues to complain of chronic dyspnea on exertion and also chronic continued.  Daughter states that she has been slightly withdrawn lately and considering palliative care.  Her blood pressure has been elevated today, patient's daughter has discontinued metoprolol as patient has had severe episodes of hypotension as  well due to UTI.  In view of her advanced age, I did not make any changes to her blood pressure medications.  She is also maintained sinus rhythm without significant atrial fibrillation burden as well. Pacemaker is functioning normally. Fortunately also tolerating anticoagulation.  I reviewed her external labs.  We discussed end-of-life discussions, discussions regarding avoidance of hospital admissions, continued availability of myself and other specialist or PCP if need be but discussed in detail. Patient now appears motivated to discuss options with palliative care.  I also discussed regarding reasons for dyspnea including but not limited to generalized debility, not taking deep breaths, bibasilar atelectasis in the lungs, also discussed regarding central apnea and nocturnal hypoxemia as well, I have recommended nocturnal oximetry.  Patient's daughter is not vaccinated and we discussed regarding societal burden of COVID-19, encouraged her to get vaccinated. Patient herself is vaccinated. This was a 40-minute encounter.   Adrian Prows, MD, [facility] 01/08/2020, 5:16 PM Office: [phone]

## 2020-01-09 ENCOUNTER — Other Ambulatory Visit: Payer: Medicare Other

## 2020-01-09 ENCOUNTER — Other Ambulatory Visit: Payer: Self-pay

## 2020-01-09 ENCOUNTER — Other Ambulatory Visit: Payer: Medicare Other | Admitting: *Deleted

## 2020-01-09 VITALS — BP 145/50 | HR 73 | Temp 97.5°F | Resp 20

## 2020-01-09 DIAGNOSIS — Z515 Encounter for palliative care: Secondary | ICD-10-CM

## 2020-01-10 DIAGNOSIS — J690 Pneumonitis due to inhalation of food and vomit: Secondary | ICD-10-CM | POA: Diagnosis not present

## 2020-01-10 DIAGNOSIS — I130 Hypertensive heart and chronic kidney disease with heart failure and stage 1 through stage 4 chronic kidney disease, or unspecified chronic kidney disease: Secondary | ICD-10-CM | POA: Diagnosis not present

## 2020-01-10 DIAGNOSIS — G894 Chronic pain syndrome: Secondary | ICD-10-CM | POA: Diagnosis not present

## 2020-01-10 DIAGNOSIS — R1310 Dysphagia, unspecified: Secondary | ICD-10-CM | POA: Diagnosis not present

## 2020-01-10 DIAGNOSIS — K3189 Other diseases of stomach and duodenum: Secondary | ICD-10-CM | POA: Diagnosis not present

## 2020-01-10 DIAGNOSIS — M80061D Age-related osteoporosis with current pathological fracture, right lower leg, subsequent encounter for fracture with routine healing: Secondary | ICD-10-CM | POA: Diagnosis not present

## 2020-01-10 NOTE — Progress Notes (Signed)
COMMUNITY PALLIATIVE CARE SW NOTE  [name] DOB: 07-15-1919 MRN: [id number]  PRIMARY CARE PROVIDER: McDiarmid, Blane Ohara, MD  RESPONSIBLE PARTY:  Acct ID - Guarantor Home Phone Work Phone Relationship Acct Type  [id number] - Jordan [name] [phone]  Self P/F     [place], [place],  [address]     PLAN OF CARE and INTERVENTIONS:             1. GOALS OF CARE/ ADVANCE CARE PLANNING: Goals of care is for patient to remain in the home. Patient is a DNR, forms left in the home. 2. SOCIAL/EMOTIONAL/SPIRITUAL ASSESSMENT/ INTERVENTIONS:  SW and RN-Jordan [name] completed a face-to-face visit with patient at her home. Her daughter-Jordan [name] was present with her. The team introduced palliative care services, team role in care and visit frequency. Patient and PCG provided verbal consent to services. Patient was finishing up her breakfast. SW observed patient walking with assistance, on her walker back to her chair. Patient tolerated this well. Patient speaks only spanish and her daughter translated. SW obtained social and medical history. Since her recovering from her pnemonia one month ago patient continues to have increased pain, decreased energy and decreased appetite. Patient does get hungry, but lacks appetite. She has difficulty swallowing and with water. Patient is dependent for ADL's. She has poor upper-body strength and pain to her back that she utilizes Tylenol and Tramadol to manage. She has shortness of breath with activity. She has swelling in ankles and hands, that could be relieved with elevation. Patient receives [facility] services 42 hours a week, but has difficulty finding a spanish speaking caregiver. Patient also receives PT 1x/day.Patient was born in [place] and moved to the [place] in 1991. She has been widowed since 56. She has three children. She was a homemaker. She is Catholic by [employer]. Patient is a DNR, forms left in the home. SW provided supportive presence, active listening,  education, reassurance of support, left DNR's on the phone. SW to provide ongoing psychosocial assessment of patient needs and comfort. 3. PATIENT/CAREGIVER EDUCATION/ COPING:  Patient is alert and oriented to self and situation. She only speaks spanish and rely on family members to translate for her.  4. PERSONAL EMERGENCY PLAN:  911 can be activated for emergencies. 5. COMMUNITY RESOURCES COORDINATION/ HEALTH CARE NAVIGATION: Patient receives [facility] service for 42 hours a week. She also receives PT 1x/week.  6. FINANCIAL/LEGAL CONCERNS/INTERVENTIONS:  NONE.      SOCIAL HX:  Social History   Tobacco Use  . Smoking status: Never Smoker  . Smokeless tobacco: Never Used  Substance Use Topics  . Alcohol use: No    Alcohol/week: 0.0 standard drinks    CODE STATUS: DNR ADVANCED DIRECTIVES: No MOST FORM COMPLETE:  No HOSPICE EDUCATION PROVIDED: No  PPS: Patient is a alert and oriented x3. She uses a walker to ambulate, needs assistance with personal care services.   Duration of visit and documentation: 60 minutes.      [address] [place], [place]

## 2020-01-13 NOTE — Telephone Encounter (Signed)
Spoke to patient's daughter Jordan [name]  rescheduled for FU RN CM call on  01/22/2020

## 2020-01-16 ENCOUNTER — Other Ambulatory Visit: Payer: Medicare Other

## 2020-01-16 ENCOUNTER — Other Ambulatory Visit: Payer: Self-pay

## 2020-01-16 DIAGNOSIS — N1831 Chronic kidney disease, stage 3a: Secondary | ICD-10-CM | POA: Diagnosis not present

## 2020-01-16 DIAGNOSIS — D631 Anemia in chronic kidney disease: Secondary | ICD-10-CM

## 2020-01-17 ENCOUNTER — Telehealth: Payer: Self-pay

## 2020-01-17 DIAGNOSIS — M80061D Age-related osteoporosis with current pathological fracture, right lower leg, subsequent encounter for fracture with routine healing: Secondary | ICD-10-CM | POA: Diagnosis not present

## 2020-01-17 DIAGNOSIS — K3189 Other diseases of stomach and duodenum: Secondary | ICD-10-CM | POA: Diagnosis not present

## 2020-01-17 DIAGNOSIS — I130 Hypertensive heart and chronic kidney disease with heart failure and stage 1 through stage 4 chronic kidney disease, or unspecified chronic kidney disease: Secondary | ICD-10-CM | POA: Diagnosis not present

## 2020-01-17 DIAGNOSIS — G894 Chronic pain syndrome: Secondary | ICD-10-CM | POA: Diagnosis not present

## 2020-01-17 DIAGNOSIS — R1310 Dysphagia, unspecified: Secondary | ICD-10-CM | POA: Diagnosis not present

## 2020-01-17 DIAGNOSIS — J690 Pneumonitis due to inhalation of food and vomit: Secondary | ICD-10-CM | POA: Diagnosis not present

## 2020-01-17 LAB — CBC WITH DIFFERENTIAL/PLATELET
Basophils Absolute: 0 10*3/uL (ref 0.0–0.2)
Basos: 1 %
EOS (ABSOLUTE): 0 10*3/uL (ref 0.0–0.4)
Eos: 1 %
Hematocrit: 29.1 % — ABNORMAL LOW (ref 34.0–46.6)
Hemoglobin: 9.9 g/dL — ABNORMAL LOW (ref 11.1–15.9)
Immature Grans (Abs): 0 10*3/uL (ref 0.0–0.1)
Immature Granulocytes: 0 %
Lymphocytes Absolute: 1.3 10*3/uL (ref 0.7–3.1)
Lymphs: 40 %
MCH: 33.8 pg — ABNORMAL HIGH (ref 26.6–33.0)
MCHC: 34 g/dL (ref 31.5–35.7)
MCV: 99 fL — ABNORMAL HIGH (ref 79–97)
Monocytes Absolute: 0.3 10*3/uL (ref 0.1–0.9)
Monocytes: 9 %
Neutrophils Absolute: 1.7 10*3/uL (ref 1.4–7.0)
Neutrophils: 49 %
Platelets: 98 10*3/uL — CL (ref 150–450)
RBC: 2.93 x10E6/uL — ABNORMAL LOW (ref 3.77–5.28)
RDW: 15.1 % (ref 11.7–15.4)
WBC: 3.3 10*3/uL — ABNORMAL LOW (ref 3.4–10.8)

## 2020-01-20 DIAGNOSIS — I130 Hypertensive heart and chronic kidney disease with heart failure and stage 1 through stage 4 chronic kidney disease, or unspecified chronic kidney disease: Secondary | ICD-10-CM | POA: Diagnosis not present

## 2020-01-20 DIAGNOSIS — J690 Pneumonitis due to inhalation of food and vomit: Secondary | ICD-10-CM | POA: Diagnosis not present

## 2020-01-20 DIAGNOSIS — R1310 Dysphagia, unspecified: Secondary | ICD-10-CM | POA: Diagnosis not present

## 2020-01-20 DIAGNOSIS — G894 Chronic pain syndrome: Secondary | ICD-10-CM | POA: Diagnosis not present

## 2020-01-20 DIAGNOSIS — M80061D Age-related osteoporosis with current pathological fracture, right lower leg, subsequent encounter for fracture with routine healing: Secondary | ICD-10-CM | POA: Diagnosis not present

## 2020-01-20 DIAGNOSIS — K3189 Other diseases of stomach and duodenum: Secondary | ICD-10-CM | POA: Diagnosis not present

## 2020-01-21 ENCOUNTER — Telehealth: Payer: Self-pay

## 2020-01-21 NOTE — Telephone Encounter (Signed)
Received phone call on nurse line from [name], regarding patient's topical medications. Lidocaine gel has still not been approved per pharmacy. Spoke with pharmacist, who will fax over PA paperwork.   Daughter also has questions regarding diclofenac gel. Advised per pharmacy that insurance will no longer cover due to medication being over the counter.   FYI to PCP  Talbot Grumbling, RN

## 2020-01-22 ENCOUNTER — Telehealth: Payer: Medicare Other

## 2020-01-22 ENCOUNTER — Telehealth: Payer: Self-pay

## 2020-01-22 NOTE — Telephone Encounter (Signed)
  Care Management   Outreach Note  01/22/2020 Name: Jordan [name] MRN: [id number] DOB: March 28, 1920  Referred by: McDiarmid, Blane Ohara, MD Reason for referral : Appointment (Follow up)   An unsuccessful telephone outreach was attempted today. The patient was referred to the case management team for assistance with care management and care coordination.   Follow Up Plan: The care management team will reach out to the patient again over the next 7-14 days.   Lazaro Arms RN, BSN, [facility] Care Management Coordinator [place] Phone: [phone] Fax: [phone]

## 2020-01-24 ENCOUNTER — Other Ambulatory Visit: Payer: Self-pay | Admitting: Cardiology

## 2020-01-25 DIAGNOSIS — M80061D Age-related osteoporosis with current pathological fracture, right lower leg, subsequent encounter for fracture with routine healing: Secondary | ICD-10-CM | POA: Diagnosis not present

## 2020-01-25 DIAGNOSIS — Z79891 Long term (current) use of opiate analgesic: Secondary | ICD-10-CM | POA: Diagnosis not present

## 2020-01-25 DIAGNOSIS — E039 Hypothyroidism, unspecified: Secondary | ICD-10-CM | POA: Diagnosis not present

## 2020-01-25 DIAGNOSIS — Z7901 Long term (current) use of anticoagulants: Secondary | ICD-10-CM | POA: Diagnosis not present

## 2020-01-25 DIAGNOSIS — I442 Atrioventricular block, complete: Secondary | ICD-10-CM | POA: Diagnosis not present

## 2020-01-25 DIAGNOSIS — M353 Polymyalgia rheumatica: Secondary | ICD-10-CM | POA: Diagnosis not present

## 2020-01-25 DIAGNOSIS — Z9181 History of falling: Secondary | ICD-10-CM | POA: Diagnosis not present

## 2020-01-25 DIAGNOSIS — N1832 Chronic kidney disease, stage 3b: Secondary | ICD-10-CM | POA: Diagnosis not present

## 2020-01-25 DIAGNOSIS — J690 Pneumonitis due to inhalation of food and vomit: Secondary | ICD-10-CM | POA: Diagnosis not present

## 2020-01-25 DIAGNOSIS — D696 Thrombocytopenia, unspecified: Secondary | ICD-10-CM | POA: Diagnosis not present

## 2020-01-25 DIAGNOSIS — E785 Hyperlipidemia, unspecified: Secondary | ICD-10-CM | POA: Diagnosis not present

## 2020-01-25 DIAGNOSIS — D631 Anemia in chronic kidney disease: Secondary | ICD-10-CM | POA: Diagnosis not present

## 2020-01-25 DIAGNOSIS — R1310 Dysphagia, unspecified: Secondary | ICD-10-CM | POA: Diagnosis not present

## 2020-01-25 DIAGNOSIS — M1030 Gout due to renal impairment, unspecified site: Secondary | ICD-10-CM | POA: Diagnosis not present

## 2020-01-25 DIAGNOSIS — G309 Alzheimer's disease, unspecified: Secondary | ICD-10-CM | POA: Diagnosis not present

## 2020-01-25 DIAGNOSIS — F0280 Dementia in other diseases classified elsewhere without behavioral disturbance: Secondary | ICD-10-CM | POA: Diagnosis not present

## 2020-01-25 DIAGNOSIS — K3189 Other diseases of stomach and duodenum: Secondary | ICD-10-CM | POA: Diagnosis not present

## 2020-01-25 DIAGNOSIS — Z950 Presence of cardiac pacemaker: Secondary | ICD-10-CM | POA: Diagnosis not present

## 2020-01-25 DIAGNOSIS — M48061 Spinal stenosis, lumbar region without neurogenic claudication: Secondary | ICD-10-CM | POA: Diagnosis not present

## 2020-01-25 DIAGNOSIS — I5032 Chronic diastolic (congestive) heart failure: Secondary | ICD-10-CM | POA: Diagnosis not present

## 2020-01-25 DIAGNOSIS — G894 Chronic pain syndrome: Secondary | ICD-10-CM | POA: Diagnosis not present

## 2020-01-25 DIAGNOSIS — M1990 Unspecified osteoarthritis, unspecified site: Secondary | ICD-10-CM | POA: Diagnosis not present

## 2020-01-25 DIAGNOSIS — I4891 Unspecified atrial fibrillation: Secondary | ICD-10-CM | POA: Diagnosis not present

## 2020-01-25 DIAGNOSIS — K219 Gastro-esophageal reflux disease without esophagitis: Secondary | ICD-10-CM | POA: Diagnosis not present

## 2020-01-25 DIAGNOSIS — I130 Hypertensive heart and chronic kidney disease with heart failure and stage 1 through stage 4 chronic kidney disease, or unspecified chronic kidney disease: Secondary | ICD-10-CM | POA: Diagnosis not present

## 2020-01-29 DIAGNOSIS — G894 Chronic pain syndrome: Secondary | ICD-10-CM | POA: Diagnosis not present

## 2020-01-29 DIAGNOSIS — M80061D Age-related osteoporosis with current pathological fracture, right lower leg, subsequent encounter for fracture with routine healing: Secondary | ICD-10-CM | POA: Diagnosis not present

## 2020-01-29 DIAGNOSIS — I130 Hypertensive heart and chronic kidney disease with heart failure and stage 1 through stage 4 chronic kidney disease, or unspecified chronic kidney disease: Secondary | ICD-10-CM | POA: Diagnosis not present

## 2020-01-29 DIAGNOSIS — J690 Pneumonitis due to inhalation of food and vomit: Secondary | ICD-10-CM | POA: Diagnosis not present

## 2020-01-29 DIAGNOSIS — R1310 Dysphagia, unspecified: Secondary | ICD-10-CM | POA: Diagnosis not present

## 2020-01-29 DIAGNOSIS — K3189 Other diseases of stomach and duodenum: Secondary | ICD-10-CM | POA: Diagnosis not present

## 2020-01-30 ENCOUNTER — Telehealth: Payer: Self-pay

## 2020-01-30 DIAGNOSIS — N3000 Acute cystitis without hematuria: Secondary | ICD-10-CM

## 2020-01-30 NOTE — Progress Notes (Signed)
COMMUNITY PALLIATIVE CARE RN NOTE  [name] DOB: [birth year] MRN: [id number]  PRIMARY CARE PROVIDER: McDiarmid, Blane Ohara, MD  RESPONSIBLE PARTY:  Acct ID - Guarantor Home Phone Work Phone Relationship Acct Type  [id number] Joanie Coddington [phone]  Self P/F     [place], [place], [place] [address]   Covid-19 Pre-screening Negative  PLAN OF CARE and INTERVENTION:  1. ADVANCE CARE PLANNING/GOALS OF CARE: Goal is for patient to remain at home and avoid hospitalizations. 2. PATIENT/CAREGIVER EDUCATION: Explained Palliative care services, symptom management, edema management, safe mobility/transfers 3. DISEASE STATUS: Joint visit made with Palliative care SW, M. Lonon. Met with patient and her daughter in patient's home. Patient does not speak English so her daughter is translating for her. Upon arrival, patient is sitting up at the dining table eating lunch. She does experience pain at times in her lower back and in bilateral feet and ankles when they are more swollen. She has Tylenol and Tramadol available when needed. She does become short of breath with exertion. Her daugher tells [place] that she had aspiration pneumonia about a month ago. Lungs are clear today. She does try to utilize her incentive spirometer some. Today, while using it she got up to 800. She had a visit with her Cardiologist yesterday. Daughter says that he is making a referral to order a monitoring device to check her oxygen levels during the night. She is tired most of the time and fatigues more easily. She is spending more time in bed. She is having more difficulties with ambulation with her walker and standing up from a seated position. She ambulates leaning in a forward position. She requires assistance with all ADLs. She is having more difficulties feeding herself. Her appetite is poor. She feels full easily. She is eating 3 meals/day of very small portions. Daughter tries to get her to drink water and keeps a cup  beside her at all times. She has intermittent bladder incontinence. Last BM yesterday 9/8. She used to have an aide through the [employer] program but has not had anyone for the past few weeks. They are having difficulties finding a caregiver who can speak Spanish. She has a meeting with an agency today to discuss care needs/hours. Patient's son lives with her and is present during the evening/night. He works during the day. Her daughter is present during the day and works from to make sure patient has 24/7 supervision. She is agreeable to future visits from Palliative care. Will continue to monitor.  HISTORY OF PRESENT ILLNESS:  This is a 86 yo female with a h/o Alzheimer's dementia, HTN, pacemaker, atherosclerosis, Afib, CKD Stage G3b and GERD.   CODE STATUS: DNR  ADVANCED DIRECTIVES: N MOST FORM: no PPS: 40%   PHYSICAL EXAM:   VITALS: Today's Vitals   01/09/20 1351  BP: (!) 145/50  Pulse: 73  Resp: 20  Temp: (!) 97.5 F (36.4 C)  TempSrc: Temporal  SpO2: 95%  PainSc: 2   PainLoc: Back    LUNGS: decreased breath sounds CARDIAC: Cor RRR EXTREMITIES: trace edema to bilateral lower extremities/ankles SKIN: thin/frail skin: exposed skin is dry and intact  NEURO: Alert to person/place, intermittent confusion, forgetful, generalized weakness, ambulatory w/walker and stand-by assistance   (Duration of visit and documentation 75 minutes)   Daryl Eastern, RN BSN

## 2020-01-30 NOTE — Telephone Encounter (Signed)
Patient's mother calls nurse line regarding mother having possible UTI. Patient states that urine has had a bad odor, dark color and has been experiencing increased back pain. Denies fever. Daughter reports that she is unable to bring her into the office and Dr. McDiarmid has prescribed medication for UTI in the past without patient being seen.   Will forward to PCP  Please advise  Jordan Grumbling, RN

## 2020-01-31 DIAGNOSIS — R0609 Other forms of dyspnea: Secondary | ICD-10-CM

## 2020-01-31 DIAGNOSIS — R5383 Other fatigue: Secondary | ICD-10-CM | POA: Diagnosis not present

## 2020-01-31 DIAGNOSIS — G4734 Idiopathic sleep related nonobstructive alveolar hypoventilation: Secondary | ICD-10-CM

## 2020-01-31 MED ORDER — CEPHALEXIN 500 MG PO CAPS: 500.0000 mg | ORAL_CAPSULE | Freq: Four times a day (QID) | ORAL | 1 refills | Status: AC

## 2020-01-31 NOTE — Telephone Encounter (Addendum)
I spoke with Ms Ruthell Jordan [name] of pt, about her mother's condition.   [name] is complaining of dysuria.  Her dgt notes change in color and odor of urine. No fever.  T yesterday 97.8 Increased back pain generalized.  Over last couple of months, [name] has become unable to assist with bed-chair transfers because of muscular weakness.  She is spending much of her day now dozing on and off.  Her appetite is very low.  Her nutrition intake has continued to decline.   A/ Possible Acute cystitis        - Previous treatment Keflex 10/01/19. Ur cx 04/2018 E.coli (pansensitive) Complete immobility due to Frailty - progressing physical and cognitive decline.  P/ Rx Keflex 500 mg QID PO [name] to notify office if condition worsens.

## 2020-01-31 NOTE — Progress Notes (Addendum)
Nocturnal oximetry 01/28/2020: SPO2 less than 88% 6.6 minutes and less than 89% 9.9 minutes, highest SpO2 99 and lowest SPO2 84%.  Consecutive less than 88% 2.4 minutes.  Awake SPO2 97.0. Oxygen desaturation events 228 and index 22. Consideration: Patient qualifies for nocturnal oxygen supplementation per Medicare guidelines Group 1.    ICD-10-CM   1. Dyspnea on exertion  R06.00 For home use only DME oxygen  2. Nocturnal hypoxemia  G47.34 For home use only DME oxygen     Adrian Prows, MD, [facility] 02/03/2020, 12:23 PM Office: [phone]

## 2020-01-31 NOTE — Progress Notes (Signed)
Please order O2 2L/min if patient wishes. Daughter may be calling you

## 2020-02-06 DIAGNOSIS — J690 Pneumonitis due to inhalation of food and vomit: Secondary | ICD-10-CM | POA: Diagnosis not present

## 2020-02-06 DIAGNOSIS — G894 Chronic pain syndrome: Secondary | ICD-10-CM | POA: Diagnosis not present

## 2020-02-06 DIAGNOSIS — M80061D Age-related osteoporosis with current pathological fracture, right lower leg, subsequent encounter for fracture with routine healing: Secondary | ICD-10-CM | POA: Diagnosis not present

## 2020-02-06 DIAGNOSIS — K3189 Other diseases of stomach and duodenum: Secondary | ICD-10-CM | POA: Diagnosis not present

## 2020-02-06 DIAGNOSIS — R1310 Dysphagia, unspecified: Secondary | ICD-10-CM | POA: Diagnosis not present

## 2020-02-06 DIAGNOSIS — I130 Hypertensive heart and chronic kidney disease with heart failure and stage 1 through stage 4 chronic kidney disease, or unspecified chronic kidney disease: Secondary | ICD-10-CM | POA: Diagnosis not present

## 2020-02-10 NOTE — Telephone Encounter (Signed)
This encounter was created in error - please disregard.

## 2020-02-11 DIAGNOSIS — I442 Atrioventricular block, complete: Secondary | ICD-10-CM | POA: Diagnosis not present

## 2020-02-11 DIAGNOSIS — Z45018 Encounter for adjustment and management of other part of cardiac pacemaker: Secondary | ICD-10-CM | POA: Diagnosis not present

## 2020-02-11 DIAGNOSIS — Z950 Presence of cardiac pacemaker: Secondary | ICD-10-CM | POA: Diagnosis not present

## 2020-02-14 DIAGNOSIS — K3189 Other diseases of stomach and duodenum: Secondary | ICD-10-CM | POA: Diagnosis not present

## 2020-02-14 DIAGNOSIS — M80061D Age-related osteoporosis with current pathological fracture, right lower leg, subsequent encounter for fracture with routine healing: Secondary | ICD-10-CM | POA: Diagnosis not present

## 2020-02-14 DIAGNOSIS — R1310 Dysphagia, unspecified: Secondary | ICD-10-CM | POA: Diagnosis not present

## 2020-02-14 DIAGNOSIS — I130 Hypertensive heart and chronic kidney disease with heart failure and stage 1 through stage 4 chronic kidney disease, or unspecified chronic kidney disease: Secondary | ICD-10-CM | POA: Diagnosis not present

## 2020-02-14 DIAGNOSIS — J690 Pneumonitis due to inhalation of food and vomit: Secondary | ICD-10-CM | POA: Diagnosis not present

## 2020-02-14 DIAGNOSIS — G894 Chronic pain syndrome: Secondary | ICD-10-CM | POA: Diagnosis not present

## 2020-02-17 ENCOUNTER — Telehealth: Payer: Self-pay

## 2020-02-17 DIAGNOSIS — R634 Abnormal weight loss: Secondary | ICD-10-CM

## 2020-02-17 DIAGNOSIS — R532 Functional quadriplegia: Secondary | ICD-10-CM

## 2020-02-17 MED ORDER — ELDERTONIC PO LIQD: 15.0000 mL | Freq: Every day | ORAL | 99 refills | Status: DC

## 2020-02-17 NOTE — Telephone Encounter (Signed)
Daughter informed and made an appt for next Tuesday for flu shot.  Makahla Kiser,CMA

## 2020-02-17 NOTE — Telephone Encounter (Signed)
Patients daughter calls nurse line requesting PCP advice on getting Flu Vaccine. Jordan [name] reports her mother has been so weak and unsure if getting the Flu Vaccine is a good idea at this time. Jordan [name] is also requesting a liquid vitamin to "help her mother feel better." Please advise.

## 2020-02-17 NOTE — Telephone Encounter (Signed)
1) Please advise Jordan [name]'s daughter that Dr Katarina Riebe recommends a Flu (High-dose) vaccination for Jordan [name].   He believes that experiencing an Influenza illness would be much more dangerous to Jordan [name] then the Flu vaccination.  2) Dr Naomy Esham sent in prescription for liquid geriatric vitamins.

## 2020-02-18 DIAGNOSIS — M80061D Age-related osteoporosis with current pathological fracture, right lower leg, subsequent encounter for fracture with routine healing: Secondary | ICD-10-CM | POA: Diagnosis not present

## 2020-02-18 DIAGNOSIS — R1310 Dysphagia, unspecified: Secondary | ICD-10-CM | POA: Diagnosis not present

## 2020-02-18 DIAGNOSIS — G894 Chronic pain syndrome: Secondary | ICD-10-CM | POA: Diagnosis not present

## 2020-02-18 DIAGNOSIS — J690 Pneumonitis due to inhalation of food and vomit: Secondary | ICD-10-CM | POA: Diagnosis not present

## 2020-02-18 DIAGNOSIS — I130 Hypertensive heart and chronic kidney disease with heart failure and stage 1 through stage 4 chronic kidney disease, or unspecified chronic kidney disease: Secondary | ICD-10-CM | POA: Diagnosis not present

## 2020-02-18 DIAGNOSIS — K3189 Other diseases of stomach and duodenum: Secondary | ICD-10-CM | POA: Diagnosis not present

## 2020-02-22 ENCOUNTER — Other Ambulatory Visit: Payer: Self-pay | Admitting: Cardiology

## 2020-02-25 ENCOUNTER — Ambulatory Visit (INDEPENDENT_AMBULATORY_CARE_PROVIDER_SITE_OTHER): Payer: Medicare Other

## 2020-02-25 ENCOUNTER — Other Ambulatory Visit: Payer: Self-pay

## 2020-02-25 VITALS — HR 68

## 2020-02-25 DIAGNOSIS — Z23 Encounter for immunization: Secondary | ICD-10-CM

## 2020-02-25 NOTE — Progress Notes (Signed)
Patient presents with daughter to nurse clinic for flu vaccination. Upon arrival, daughter had concerns regarding fatigue when getting ready for today's visit. Daughter requested that I listen to patient's lungs. Breath sounds were clear and equal in all lobes. Chest expansion symmetrical. SpO2- 97%, 68 HR, patient has no complaints of SHOB.   Proceeded with administration of flu vaccine. Administered in LD, site unremarkable, tolerated injection well.   Will route to PCP  Talbot Grumbling, RN

## 2020-03-02 ENCOUNTER — Telehealth: Payer: Self-pay

## 2020-03-02 NOTE — Telephone Encounter (Signed)
LATE ENTRY (1:55pm)Telephone call to patient to schedule palliative care visit with patient. Patient/family in agreement with home visit on 03/03/20 @11am 

## 2020-03-03 ENCOUNTER — Other Ambulatory Visit: Payer: Medicare Other | Admitting: *Deleted

## 2020-03-03 ENCOUNTER — Other Ambulatory Visit: Payer: Self-pay

## 2020-03-03 ENCOUNTER — Other Ambulatory Visit: Payer: Medicare Other

## 2020-03-03 ENCOUNTER — Telehealth: Payer: Self-pay

## 2020-03-03 VITALS — BP 160/68 | HR 67 | Temp 97.6°F | Resp 19 | Ht <= 58 in | Wt 93.4 lb

## 2020-03-03 DIAGNOSIS — Z515 Encounter for palliative care: Secondary | ICD-10-CM

## 2020-03-03 NOTE — Telephone Encounter (Signed)
Manisha from [facility] called and wants you to give her a call back @ her personal cell: [phone].  Patient is having more trouble standing, difficulty eating, losing weight and she is also declining more since from when she started seeing her last month. She has other issues she wants to discuss with you about patient.

## 2020-03-03 NOTE — Progress Notes (Signed)
COMMUNITY PALLIATIVE CARE SW NOTE  [name] DOB: Oct 17, 1919 MRN: [id number]  PRIMARY CARE PROVIDER: McDiarmid, Blane Ohara, MD  RESPONSIBLE PARTY:  Acct ID - Guarantor Home Phone Work Phone Relationship Acct Type  [id number] - SKARLETT, SEDLACEK [phone]  Self P/F     [place], [place], [place] [address]     PLAN OF CARE and INTERVENTIONS:             1. GOALS OF CARE/ ADVANCE CARE PLANNING:  Goals of care is for patient to remain in the home. Patient is a DNR, forms left in the home. SOCIAL/EMOTIONAL/SPIRITUAL ASSESSMENT/ INTERVENTIONS:  SW and RN- Daryl Eastern completed a home visit with patient. Her daughter-Mary York Cerise was present with her. Patient's daughter provided and update on patient status. [name] advised that patient seems to be more perked up today. Yesterday was in a lot of pain, difficulty standing or move her feet to walk. Increased shortness of breath. Patient is still using her walker. Patient more frustrated with changes in her condition-losing her abilities. Patient is utilizing wheelchair to take her out. Her appetite has declined. Patient is sleeping later and eating less. She is eating three meals, but somedays she will eat only one small meal. When she eats she eats at least 50% of meals.  Education provided about disease process. Daughter is requesting that RN follow-up with primary care physician to provide update. Daughter is provided assistance with personal care tasks. Patient has periods where she is not able to hold her head up. Patient is on 2 L of o2 at night and PRN during the day. Patient has a nose bleed last week. Patient is swollen up until yesterday.  She has occasional continent episodes of bowel and bladder. Daughter feel that patient has lost weight as evident by her loose clothes, thinner in extremities and pale overall. Daughter report that patient is experiencing generalized pain that she takes Tramadol. Patient still has not received a  Spanish-speaking aide through [facility] program. Patient's daughter and son is taking turns caring for patient. The program has offered to send another English-speaking aid, but daughter is reluctant because of past bad experiences. SW encouraged daughter to have [facility] nurse visit before a new aid is sent out to discuss care plan needs and her expectation. SW also made suggestion to use cards that have [place] and Spanish words to allow her mother and aid to communicate. The daughter feels that she having an additional person maybe more frustrating to her mother. Education provided about hospice services. SW provided supportive presence, active listening, recommendations for resources and support, provided education regarding hospice, reassurance of support, assessment of needs and comfort. *SW consulted with [facility] spanish community liaison l who will also touch basis with the family and provide support.  2. PATIENT/CAREGIVER EDUCATION/ COPING: Patient is alert and oriented to self and situation. She only speaks spanish and rely on family members to translate for her.  3. PERSONAL EMERGENCY PLAN:  911 can be activated for emergencies. 4. COMMUNITY RESOURCES COORDINATION/ HEALTH CARE NAVIGATION:  Patient receives [facility] service for 42 hours a week.  5. FINANCIAL/LEGAL CONCERNS/INTERVENTIONS:  None.     SOCIAL HX:  Social History   Tobacco Use  . Smoking status: Never Smoker  . Smokeless tobacco: Never Used  Substance Use Topics  . Alcohol use: No    Alcohol/week: 0.0 standard drinks    CODE STATUS: DNR ADVANCED DIRECTIVES: No MOST FORM COMPLETE:  No HOSPICE EDUCATION PROVIDED: Yes  PPS: Patient is a alert and oriented x3.  She uses a walker to ambulate but is having increased weakness, shortness of breath, she requires needs assistance with personal care services. Patient  Is staying in bed more.   Duration of visit and documentation: 60 minutes.        [address] [place], [place]

## 2020-03-05 ENCOUNTER — Telehealth: Payer: Self-pay | Admitting: *Deleted

## 2020-03-05 NOTE — Telephone Encounter (Signed)
Called and spoke with [name], at Dr. Irven Shelling office to inform that our [facility], [place],  visited with patient, daughter and son tpday to explain in more detail regarding hospice services. The patient is Spanish speaking so our liaison was able to discuss with patient in her language. They are now all agreeable to transition patient from palliative care to hospice services. [name] that the verbal order that Dr. Einar Gip gave me for a hospice consult on 03/03/20 was sent over to our hospice referral center today. [name] advised that she would make Dr. Einar Gip aware.

## 2020-03-05 NOTE — Telephone Encounter (Signed)
Manisha from [place] called to let us know that pt agreed to go to hospice

## 2020-03-06 ENCOUNTER — Telehealth (INDEPENDENT_AMBULATORY_CARE_PROVIDER_SITE_OTHER): Payer: Medicare Other | Admitting: Cardiology

## 2020-03-06 DIAGNOSIS — Z6824 Body mass index (BMI) 24.0-24.9, adult: Secondary | ICD-10-CM | POA: Diagnosis not present

## 2020-03-06 DIAGNOSIS — R54 Age-related physical debility: Secondary | ICD-10-CM | POA: Diagnosis not present

## 2020-03-06 DIAGNOSIS — I1310 Hypertensive heart and chronic kidney disease without heart failure, with stage 1 through stage 4 chronic kidney disease, or unspecified chronic kidney disease: Secondary | ICD-10-CM | POA: Diagnosis not present

## 2020-03-06 DIAGNOSIS — E039 Hypothyroidism, unspecified: Secondary | ICD-10-CM | POA: Diagnosis not present

## 2020-03-06 DIAGNOSIS — E785 Hyperlipidemia, unspecified: Secondary | ICD-10-CM | POA: Diagnosis not present

## 2020-03-06 DIAGNOSIS — Z9981 Dependence on supplemental oxygen: Secondary | ICD-10-CM | POA: Diagnosis not present

## 2020-03-06 DIAGNOSIS — Z515 Encounter for palliative care: Secondary | ICD-10-CM | POA: Diagnosis not present

## 2020-03-06 DIAGNOSIS — R5381 Other malaise: Secondary | ICD-10-CM

## 2020-03-06 DIAGNOSIS — R627 Adult failure to thrive: Secondary | ICD-10-CM

## 2020-03-06 DIAGNOSIS — Z741 Need for assistance with personal care: Secondary | ICD-10-CM | POA: Diagnosis not present

## 2020-03-06 DIAGNOSIS — I69318 Other symptoms and signs involving cognitive functions following cerebral infarction: Secondary | ICD-10-CM | POA: Diagnosis not present

## 2020-03-06 DIAGNOSIS — M109 Gout, unspecified: Secondary | ICD-10-CM | POA: Diagnosis not present

## 2020-03-06 DIAGNOSIS — I442 Atrioventricular block, complete: Secondary | ICD-10-CM | POA: Diagnosis not present

## 2020-03-06 DIAGNOSIS — K219 Gastro-esophageal reflux disease without esophagitis: Secondary | ICD-10-CM | POA: Diagnosis not present

## 2020-03-06 DIAGNOSIS — F0390 Unspecified dementia without behavioral disturbance: Secondary | ICD-10-CM | POA: Diagnosis not present

## 2020-03-06 DIAGNOSIS — Z8719 Personal history of other diseases of the digestive system: Secondary | ICD-10-CM | POA: Diagnosis not present

## 2020-03-06 DIAGNOSIS — I480 Paroxysmal atrial fibrillation: Secondary | ICD-10-CM

## 2020-03-06 DIAGNOSIS — I2510 Atherosclerotic heart disease of native coronary artery without angina pectoris: Secondary | ICD-10-CM | POA: Diagnosis not present

## 2020-03-06 DIAGNOSIS — N189 Chronic kidney disease, unspecified: Secondary | ICD-10-CM | POA: Diagnosis not present

## 2020-03-06 DIAGNOSIS — Z950 Presence of cardiac pacemaker: Secondary | ICD-10-CM

## 2020-03-06 DIAGNOSIS — Z8679 Personal history of other diseases of the circulatory system: Secondary | ICD-10-CM | POA: Diagnosis not present

## 2020-03-06 DIAGNOSIS — R5383 Other fatigue: Secondary | ICD-10-CM

## 2020-03-06 NOTE — Telephone Encounter (Signed)
She had left her phone number, how can I get in touch with her or [facility]

## 2020-03-06 NOTE — Telephone Encounter (Signed)
Patient's daughter had discussed with me regarding future care of her mother who is getting much more debilitated and also frail.  Patient has been sleeping most of the times, has continued to lose weight but is very alert and oriented.  Her brother has had a hard time converting her from palliative care to hospice care and both she and her brother help in taking care of her mother.  Patient lives with her son but daughter is present many other times and majority of the times while son is working.  I spoke with patient's son Jordan [name] as well today, discussed regarding gradual worsening overall physical status and that her prognosis that she would be alive in the next 6 months is very low and as her activity level has significantly decreased, she is essentially mostly sleeping most of the time, converting her from palliative care to hospice would be very appropriate.  After I discussed with him, I called patient's daughter Jordan [name] and conveyed my opinion to her.  They are all in agreement to make her comfortable and to convert her to hospice care.  I have also called and discussed with [facility] palliative care, and gave her instructions to place orders for hospice care.  I spent a total of 25 minutes in coordination of care and telephone discussions.    ICD-10-CM   1. Admission for hospice care  Z51.5   2. Failure to thrive in adult  R62.7   3. Advanced age  R24   4. Malaise and fatigue  R53.81    R53.83   5. Complete heart block ([place]) S/P permanent pacemaker 2015  I44.2   6. Paroxysmal atrial fibrillation (HCC) CHA2DS2-VASc Score is 6 with yearly risk of stroke of 9.8 %  I48.0   7. Pacemaker Dual Chamber Medtronic Adapta L ADDR  Z95.0      Adrian Prows, MD, [facility] 03/06/2020, 4:44 PM Office: [phone] Pager: [phone]   CC: Sherren Mocha McDiarmid, MD

## 2020-03-09 ENCOUNTER — Telehealth: Payer: Self-pay | Admitting: Cardiology

## 2020-03-09 DIAGNOSIS — E785 Hyperlipidemia, unspecified: Secondary | ICD-10-CM | POA: Diagnosis not present

## 2020-03-09 DIAGNOSIS — R627 Adult failure to thrive: Secondary | ICD-10-CM | POA: Diagnosis not present

## 2020-03-09 DIAGNOSIS — R54 Age-related physical debility: Secondary | ICD-10-CM | POA: Diagnosis not present

## 2020-03-09 DIAGNOSIS — I69318 Other symptoms and signs involving cognitive functions following cerebral infarction: Secondary | ICD-10-CM | POA: Diagnosis not present

## 2020-03-09 DIAGNOSIS — Z515 Encounter for palliative care: Secondary | ICD-10-CM

## 2020-03-09 DIAGNOSIS — R0609 Other forms of dyspnea: Secondary | ICD-10-CM | POA: Diagnosis not present

## 2020-03-09 DIAGNOSIS — I442 Atrioventricular block, complete: Secondary | ICD-10-CM

## 2020-03-09 DIAGNOSIS — I2510 Atherosclerotic heart disease of native coronary artery without angina pectoris: Secondary | ICD-10-CM | POA: Diagnosis not present

## 2020-03-09 DIAGNOSIS — N189 Chronic kidney disease, unspecified: Secondary | ICD-10-CM | POA: Diagnosis not present

## 2020-03-09 DIAGNOSIS — R0600 Dyspnea, unspecified: Secondary | ICD-10-CM

## 2020-03-09 DIAGNOSIS — Z8679 Personal history of other diseases of the circulatory system: Secondary | ICD-10-CM | POA: Diagnosis not present

## 2020-03-09 DIAGNOSIS — I1310 Hypertensive heart and chronic kidney disease without heart failure, with stage 1 through stage 4 chronic kidney disease, or unspecified chronic kidney disease: Secondary | ICD-10-CM | POA: Diagnosis not present

## 2020-03-09 NOTE — Telephone Encounter (Signed)
ICD-10-CM   1. Admission for hospice care  Z51.5   2. Failure to thrive in adult  R62.7   3. Advanced age  R1   4. Dyspnea on exertion  R06.00   5. Complete heart block ([place]) S/P permanent pacemaker 2015  I44.2    I have reviewed the home health services implementation and plans of care, signed off on the orders.   Adrian Prows, MD, [facility] 03/09/2020, 4:52 PM Office: [phone] Pager: [phone]

## 2020-03-09 NOTE — Progress Notes (Signed)
COMMUNITY PALLIATIVE CARE RN NOTE  [name] DOB: 1919/07/26 MRN: [id number]  PRIMARY CARE PROVIDER: McDiarmid, Blane Ohara, MD  RESPONSIBLE PARTY: [name] (daughter) Acct ID - Guarantor Home Phone Work Phone Relationship Acct Type  [id number] Jordan [name], Jordan [name] [phone]  Self P/F     [place], [place], [place] [address]   Covid-19 Pre-screening Negative  PLAN OF CARE and INTERVENTION:  1. ADVANCE CARE PLANNING/GOALS OF CARE: Goal is for patient to remain in her home with comfort measures only.  2. PATIENT/CAREGIVER EDUCATION: Symptom management, pain management, regulation of bowels, hospice education 3. DISEASE STATUS: Joint visit made with Palliative care SW, Monica Lonon. Met with patient's daughter, [name], initially in the living room for her to provide patient update, then completed assessment with patient in her bedroom. Daughter states that patient has been experiencing increased generalized pain and she gives Tramadol to help. Patient c/o back pain along her spine during assessment today while lying in bed. Daughter reports patient has been more discouraged, frustrated and aware of her loss in abilities. She is having more difficult with mobility. She has issues with standing and getting her feet to move as she would like. She now requires assistance with standing and ambulates with assistance using her walker. Discussed using wheelchair more often to conserve patient's energy. Yesterday, when taken to the bathroom she was unable to hold her head up while sitting due to progressive weakness. She is becoming more short of breath with any exertion. She wears oxygen at 2L/min via [place] at night and as needed during the day. She is sleeping more overall and later into the afternoon. Her appetite continues to deteriorate. She is eating about 50% less than she was eating a month ago. Muscle wasting noted, especially in her face/neck/chest region. Her clothes are fitting more loosely. No  edema noted to lower extremities. I suspect this is due to patient spending more time in bed with legs elevated. She is intermittently incontinent of both bowel and bladder and wears Depends. She does have occasional issues with constipation, especially if she requires more pain medication. During assessment, patient remained in bed. She is Spanish speaking so her daughter translated for [place]. Daughter will give pain medication after patient eats something small first. Patient says she feels weak and tired most of the time. She does not appear to be in any distress. She says she does need more help. Dr. Einar Gip requested that I give him a call to discuss patient's overall condition. I spoke with him today and he gave a verbal order for a hospice consult, but states he will speak to both patient's son and daughter to make sure they are in agreement first. The daughter is agreeable, however the son is more reluctant. Will continue to monitor.   HISTORY OF PRESENT ILLNESS:  This is a 86 yo female with a h/o Alzheimer's dementia, HTN, pacemaker, atherosclerosis, Afib, CKD Stage G3b and GERD. Patient will continue under palliative services until son is in agreement to hospice consult.   CODE STATUS: DNR ADVANCED DIRECTIVES: Y MOST FORM: no PPS: 30%   PHYSICAL EXAM:   VITALS: Today's Vitals   03/03/20 1142  BP: (!) 160/68  Pulse: 67  Resp: 19  Temp: 97.6 F (36.4 C)  TempSrc: Temporal  SpO2: 94%  Weight: 93 lb 6.4 oz (42.4 kg)  Height: _0  (1.321 m)  PainSc: 6   PainLoc: Back    LUNGS: decreased breath sounds CARDIAC: Cor RRR EXTREMITIES:  No edema SKIN: Exposed skin is dry and intact  NEURO: Alert and oriented x 2 (person/place), forgetful, increased generalized weakness, ambulatory w/walker and 1 person assistance   (Duration of visit and documentation 75 minutes)   Daryl Eastern, RN BSN

## 2020-03-13 ENCOUNTER — Telehealth: Payer: Self-pay

## 2020-03-13 DIAGNOSIS — Z8679 Personal history of other diseases of the circulatory system: Secondary | ICD-10-CM | POA: Diagnosis not present

## 2020-03-13 DIAGNOSIS — N189 Chronic kidney disease, unspecified: Secondary | ICD-10-CM | POA: Diagnosis not present

## 2020-03-13 DIAGNOSIS — E785 Hyperlipidemia, unspecified: Secondary | ICD-10-CM | POA: Diagnosis not present

## 2020-03-13 DIAGNOSIS — I2510 Atherosclerotic heart disease of native coronary artery without angina pectoris: Secondary | ICD-10-CM | POA: Diagnosis not present

## 2020-03-13 DIAGNOSIS — I69318 Other symptoms and signs involving cognitive functions following cerebral infarction: Secondary | ICD-10-CM | POA: Diagnosis not present

## 2020-03-13 DIAGNOSIS — I1310 Hypertensive heart and chronic kidney disease without heart failure, with stage 1 through stage 4 chronic kidney disease, or unspecified chronic kidney disease: Secondary | ICD-10-CM | POA: Diagnosis not present

## 2020-03-13 NOTE — Telephone Encounter (Signed)
Jordan [name] [phone]) who is the Company secretary for patient called that patient's heart rate was 105 and she is having irregular heartbeat and wanted to let you know she also said patient's daughter told her "she was going to download her pacemaker"

## 2020-03-13 NOTE — Telephone Encounter (Signed)
Patient's heart rate was elevated.  I left a message to the nurse stating that unless she is symptomatic nothing needs to be done.  Patient presently in hospice.

## 2020-03-14 ENCOUNTER — Other Ambulatory Visit: Payer: Self-pay | Admitting: Family Medicine

## 2020-03-14 DIAGNOSIS — G609 Hereditary and idiopathic neuropathy, unspecified: Secondary | ICD-10-CM

## 2020-03-17 DIAGNOSIS — Z8679 Personal history of other diseases of the circulatory system: Secondary | ICD-10-CM | POA: Diagnosis not present

## 2020-03-17 DIAGNOSIS — E785 Hyperlipidemia, unspecified: Secondary | ICD-10-CM | POA: Diagnosis not present

## 2020-03-17 DIAGNOSIS — N189 Chronic kidney disease, unspecified: Secondary | ICD-10-CM | POA: Diagnosis not present

## 2020-03-17 DIAGNOSIS — I1310 Hypertensive heart and chronic kidney disease without heart failure, with stage 1 through stage 4 chronic kidney disease, or unspecified chronic kidney disease: Secondary | ICD-10-CM | POA: Diagnosis not present

## 2020-03-17 DIAGNOSIS — I69318 Other symptoms and signs involving cognitive functions following cerebral infarction: Secondary | ICD-10-CM | POA: Diagnosis not present

## 2020-03-17 DIAGNOSIS — I2510 Atherosclerotic heart disease of native coronary artery without angina pectoris: Secondary | ICD-10-CM | POA: Diagnosis not present

## 2020-03-19 DIAGNOSIS — E785 Hyperlipidemia, unspecified: Secondary | ICD-10-CM | POA: Diagnosis not present

## 2020-03-19 DIAGNOSIS — I1310 Hypertensive heart and chronic kidney disease without heart failure, with stage 1 through stage 4 chronic kidney disease, or unspecified chronic kidney disease: Secondary | ICD-10-CM | POA: Diagnosis not present

## 2020-03-19 DIAGNOSIS — I2510 Atherosclerotic heart disease of native coronary artery without angina pectoris: Secondary | ICD-10-CM | POA: Diagnosis not present

## 2020-03-19 DIAGNOSIS — I69318 Other symptoms and signs involving cognitive functions following cerebral infarction: Secondary | ICD-10-CM | POA: Diagnosis not present

## 2020-03-19 DIAGNOSIS — N189 Chronic kidney disease, unspecified: Secondary | ICD-10-CM | POA: Diagnosis not present

## 2020-03-19 DIAGNOSIS — Z8679 Personal history of other diseases of the circulatory system: Secondary | ICD-10-CM | POA: Diagnosis not present

## 2020-03-22 ENCOUNTER — Emergency Department (HOSPITAL_COMMUNITY)

## 2020-03-22 ENCOUNTER — Other Ambulatory Visit: Payer: Self-pay

## 2020-03-22 ENCOUNTER — Inpatient Hospital Stay (HOSPITAL_COMMUNITY)
Admission: EM | Admit: 2020-03-22 | Discharge: 2020-03-31 | DRG: 480 | Disposition: A | Discharge status: Hospice | Attending: Family Medicine | Admitting: Family Medicine

## 2020-03-22 DIAGNOSIS — F0280 Dementia in other diseases classified elsewhere without behavioral disturbance: Secondary | ICD-10-CM | POA: Diagnosis present

## 2020-03-22 DIAGNOSIS — Z91048 Other nonmedicinal substance allergy status: Secondary | ICD-10-CM

## 2020-03-22 DIAGNOSIS — Z841 Family history of disorders of kidney and ureter: Secondary | ICD-10-CM

## 2020-03-22 DIAGNOSIS — Z79890 Hormone replacement therapy (postmenopausal): Secondary | ICD-10-CM

## 2020-03-22 DIAGNOSIS — Z6824 Body mass index (BMI) 24.0-24.9, adult: Secondary | ICD-10-CM

## 2020-03-22 DIAGNOSIS — S72141A Displaced intertrochanteric fracture of right femur, initial encounter for closed fracture: Principal | ICD-10-CM

## 2020-03-22 DIAGNOSIS — R54 Age-related physical debility: Secondary | ICD-10-CM | POA: Diagnosis present

## 2020-03-22 DIAGNOSIS — I442 Atrioventricular block, complete: Secondary | ICD-10-CM | POA: Diagnosis present

## 2020-03-22 DIAGNOSIS — Z79899 Other long term (current) drug therapy: Secondary | ICD-10-CM

## 2020-03-22 DIAGNOSIS — I10 Essential (primary) hypertension: Secondary | ICD-10-CM | POA: Diagnosis not present

## 2020-03-22 DIAGNOSIS — M353 Polymyalgia rheumatica: Secondary | ICD-10-CM | POA: Diagnosis present

## 2020-03-22 DIAGNOSIS — R0902 Hypoxemia: Secondary | ICD-10-CM | POA: Diagnosis not present

## 2020-03-22 DIAGNOSIS — Z419 Encounter for procedure for purposes other than remedying health state, unspecified: Secondary | ICD-10-CM

## 2020-03-22 DIAGNOSIS — I5032 Chronic diastolic (congestive) heart failure: Secondary | ICD-10-CM | POA: Diagnosis present

## 2020-03-22 DIAGNOSIS — Z950 Presence of cardiac pacemaker: Secondary | ICD-10-CM

## 2020-03-22 DIAGNOSIS — Z96611 Presence of right artificial shoulder joint: Secondary | ICD-10-CM | POA: Diagnosis present

## 2020-03-22 DIAGNOSIS — N179 Acute kidney failure, unspecified: Secondary | ICD-10-CM | POA: Diagnosis not present

## 2020-03-22 DIAGNOSIS — D696 Thrombocytopenia, unspecified: Secondary | ICD-10-CM | POA: Diagnosis present

## 2020-03-22 DIAGNOSIS — Z91030 Bee allergy status: Secondary | ICD-10-CM

## 2020-03-22 DIAGNOSIS — J439 Emphysema, unspecified: Secondary | ICD-10-CM | POA: Diagnosis not present

## 2020-03-22 DIAGNOSIS — I700 Atherosclerosis of aorta: Secondary | ICD-10-CM | POA: Diagnosis present

## 2020-03-22 DIAGNOSIS — M79604 Pain in right leg: Secondary | ICD-10-CM | POA: Diagnosis not present

## 2020-03-22 DIAGNOSIS — J189 Pneumonia, unspecified organism: Secondary | ICD-10-CM | POA: Diagnosis not present

## 2020-03-22 DIAGNOSIS — E785 Hyperlipidemia, unspecified: Secondary | ICD-10-CM | POA: Diagnosis present

## 2020-03-22 DIAGNOSIS — W06XXXA Fall from bed, initial encounter: Secondary | ICD-10-CM | POA: Diagnosis present

## 2020-03-22 DIAGNOSIS — F32A Depression, unspecified: Secondary | ICD-10-CM | POA: Diagnosis present

## 2020-03-22 DIAGNOSIS — J470 Bronchiectasis with acute lower respiratory infection: Secondary | ICD-10-CM | POA: Diagnosis present

## 2020-03-22 DIAGNOSIS — G894 Chronic pain syndrome: Secondary | ICD-10-CM | POA: Diagnosis present

## 2020-03-22 DIAGNOSIS — H409 Unspecified glaucoma: Secondary | ICD-10-CM | POA: Diagnosis present

## 2020-03-22 DIAGNOSIS — Z8679 Personal history of other diseases of the circulatory system: Secondary | ICD-10-CM | POA: Diagnosis not present

## 2020-03-22 DIAGNOSIS — Z8673 Personal history of transient ischemic attack (TIA), and cerebral infarction without residual deficits: Secondary | ICD-10-CM

## 2020-03-22 DIAGNOSIS — E875 Hyperkalemia: Secondary | ICD-10-CM | POA: Diagnosis not present

## 2020-03-22 DIAGNOSIS — E871 Hypo-osmolality and hyponatremia: Secondary | ICD-10-CM | POA: Diagnosis present

## 2020-03-22 DIAGNOSIS — I69318 Other symptoms and signs involving cognitive functions following cerebral infarction: Secondary | ICD-10-CM | POA: Diagnosis not present

## 2020-03-22 DIAGNOSIS — Z20822 Contact with and (suspected) exposure to covid-19: Secondary | ICD-10-CM | POA: Diagnosis present

## 2020-03-22 DIAGNOSIS — N184 Chronic kidney disease, stage 4 (severe): Secondary | ICD-10-CM | POA: Diagnosis present

## 2020-03-22 DIAGNOSIS — Z888 Allergy status to other drugs, medicaments and biological substances status: Secondary | ICD-10-CM

## 2020-03-22 DIAGNOSIS — M1A0610 Idiopathic chronic gout, right knee, without tophus (tophi): Secondary | ICD-10-CM | POA: Diagnosis present

## 2020-03-22 DIAGNOSIS — M810 Age-related osteoporosis without current pathological fracture: Secondary | ICD-10-CM | POA: Diagnosis present

## 2020-03-22 DIAGNOSIS — Z9981 Dependence on supplemental oxygen: Secondary | ICD-10-CM

## 2020-03-22 DIAGNOSIS — Z66 Do not resuscitate: Secondary | ICD-10-CM | POA: Diagnosis present

## 2020-03-22 DIAGNOSIS — G609 Hereditary and idiopathic neuropathy, unspecified: Secondary | ICD-10-CM | POA: Diagnosis present

## 2020-03-22 DIAGNOSIS — I130 Hypertensive heart and chronic kidney disease with heart failure and stage 1 through stage 4 chronic kidney disease, or unspecified chronic kidney disease: Secondary | ICD-10-CM | POA: Diagnosis present

## 2020-03-22 DIAGNOSIS — Z01818 Encounter for other preprocedural examination: Secondary | ICD-10-CM | POA: Diagnosis not present

## 2020-03-22 DIAGNOSIS — I2510 Atherosclerotic heart disease of native coronary artery without angina pectoris: Secondary | ICD-10-CM | POA: Diagnosis not present

## 2020-03-22 DIAGNOSIS — H9190 Unspecified hearing loss, unspecified ear: Secondary | ICD-10-CM | POA: Diagnosis present

## 2020-03-22 DIAGNOSIS — H353190 Nonexudative age-related macular degeneration, unspecified eye, stage unspecified: Secondary | ICD-10-CM | POA: Diagnosis present

## 2020-03-22 DIAGNOSIS — I1310 Hypertensive heart and chronic kidney disease without heart failure, with stage 1 through stage 4 chronic kidney disease, or unspecified chronic kidney disease: Secondary | ICD-10-CM | POA: Diagnosis not present

## 2020-03-22 DIAGNOSIS — M1A0210 Idiopathic chronic gout, right elbow, without tophus (tophi): Secondary | ICD-10-CM

## 2020-03-22 DIAGNOSIS — W19XXXA Unspecified fall, initial encounter: Secondary | ICD-10-CM | POA: Diagnosis not present

## 2020-03-22 DIAGNOSIS — R52 Pain, unspecified: Secondary | ICD-10-CM | POA: Diagnosis not present

## 2020-03-22 DIAGNOSIS — G309 Alzheimer's disease, unspecified: Secondary | ICD-10-CM | POA: Diagnosis present

## 2020-03-22 DIAGNOSIS — K219 Gastro-esophageal reflux disease without esophagitis: Secondary | ICD-10-CM | POA: Diagnosis present

## 2020-03-22 DIAGNOSIS — D539 Nutritional anemia, unspecified: Secondary | ICD-10-CM | POA: Diagnosis present

## 2020-03-22 DIAGNOSIS — I480 Paroxysmal atrial fibrillation: Secondary | ICD-10-CM | POA: Diagnosis present

## 2020-03-22 DIAGNOSIS — R64 Cachexia: Secondary | ICD-10-CM | POA: Diagnosis present

## 2020-03-22 DIAGNOSIS — Z7901 Long term (current) use of anticoagulants: Secondary | ICD-10-CM

## 2020-03-22 DIAGNOSIS — E039 Hypothyroidism, unspecified: Secondary | ICD-10-CM | POA: Diagnosis present

## 2020-03-22 DIAGNOSIS — R059 Cough, unspecified: Secondary | ICD-10-CM

## 2020-03-22 DIAGNOSIS — N189 Chronic kidney disease, unspecified: Secondary | ICD-10-CM | POA: Diagnosis not present

## 2020-03-22 MED ORDER — ACETAMINOPHEN 160 MG/5ML PO SOLN
325.0000 mg | Freq: Four times a day (QID) | ORAL | Status: DC
  Administered 2020-03-23 – 2020-03-24 (×6): 325 mg via ORAL
  Filled 2020-03-22 (×6): qty 20.3

## 2020-03-22 MED ORDER — FENTANYL CITRATE (PF) 100 MCG/2ML IJ SOLN
25.0000 ug | Freq: Once | INTRAMUSCULAR | Status: AC
  Administered 2020-03-22: 25 ug via INTRAVENOUS
  Filled 2020-03-22: qty 2

## 2020-03-22 MED ORDER — ACETAMINOPHEN 325 MG PO TABS
650.0000 mg | ORAL_TABLET | Freq: Once | ORAL | Status: DC
  Filled 2020-03-22: qty 2

## 2020-03-22 NOTE — ED Provider Notes (Signed)
[facility] EMERGENCY DEPARTMENT Provider Note   CSN: [id number] Arrival date & time: 03/22/20  2024     History Chief Complaint  Patient presents with  . Hip Pain    Jordan [name] is a 86 y.o. female past medical history of complete heart block, Alzheimer's dementia, failure to thrive on hospice care, presenting to the ED with right hip pain after mechanical fall.  Patient was reportedly sitting on the edge of her bed and reach towards her nightstand when she slipped and fell to the floor.  She landed on her right side.  She did not hit her head.  She is complaining of pain to the right hip.  There is associated deformity and swelling reported.  Patient also reports some decrease sensation to the right leg, and feeling too weak to move her leg.  Recently family decided to transition her from palliative care to hospice care.  She is being cared for at home by family.  They states she can ambulate with a walker and assistance at baseline.  They also states she is unaware that she is on hospice care because they know it would frighten her.  The history is provided by a relative and the nursing home.       Past Medical History:  Diagnosis Date  . Acute gout of right knee 09/14/2018   Resolved. Continue allopurinol and cochicine prophylaxis  . Acute hip pain, left 06/29/2018  . Acute pancreatitis 04/05/2018  . AKI (acute kidney injury) ([place])   . Alzheimer's dementia [facility]) 07/30/2013   04/17/14 MoCA - Blind (Spanish version); 9 out of 22.  Correct MoCA = 12 out of 30.    . Arthritis   . Atherosclerosis of aorta ([place]) 10/05/2015  . Back pain 10/05/2015  . Bronchiectasis ([place]) 10/03/2017  . Calcification of native coronary artery 10/05/2015   Chest CT 2011.   . Carpal tunnel syndrome 06/29/2006   Qualifier: Diagnosis of  By: Benna Dunks    . Cervicalgia 05/18/2009   Qualifier: Diagnosis of  By: Walker Kehr MD, Patrick Jupiter    . CHF (congestive heart failure) ([place])   . Chronic bilateral  thoracic back pain 10/05/2015  . Chronic constipation   . Chronic diastolic heart failure ([place]) 09/01/2013  . Chronic pain syndrome 05/09/2014  . CKD (chronic kidney disease) stage 3, GFR 30-59 ml/min 09/03/2013  . Collapsed vertebra due to osteoporosis ([place]) 10/06/2015   Multiple thoracolumbar vertebral body collapses Chest CT 2018  . Combined visual hearing impairment 11/12/2013  . Complete heart block ([place])   . Complete immobility due to severe physical disability or frailty ([place]) 05/30/2014  . Constipation   . Cryptogenic stroke ([place]) S/P  LOOP RECORDER IMPLANT   DX  01/2013  --  SYNCOPAL EPISODE --  EPISODIC  ATRIAL TACHY/ FIBULATION  AND PAUSES  . CYSTOCELE/RECTOCELE/PROLAPSE,UNSPEC. 06/29/2006   Qualifier: Diagnosis of  By: Benna Dunks    . Depressive disorder 06/29/2006       . Dilation of esophagus 06/16/2017   Barium Esophagram 06/25/17: Poor esophageal motility. Dilated esophagus with tertiary contractions.  Smooth narrowing distal esophagus similar to the prior study.  Moderate narrowing. Barium tablet passed through this area after several sips of water.  Marland Kitchen Displaced fracture of fifth metatarsal bone, right foot, subsequent encounter for fracture with routine healing 06/05/2017  . Diverticulosis of colon   . DIVERTICULOSIS OF COLON 06/29/2006   Qualifier: Diagnosis of  By: Benna Dunks    . Dry eyes   .  Dysphagia 09/15/2015  . Encounter for care of pacemaker   . Fall from bed 03/17/2016  . Fatigue   . Food impaction of esophagus   . Frequency of urination   . Gait instability   . Generalized osteoarthritis of multiple sites 06/15/2017  . Glaucoma 04/18/2014  . Glaucoma of both eyes   . Gout 09/21/2013  . Hand Heaviness 03/17/2016  . Headache   . Hearing loss of aging 05/09/2014  . HERNIA, INCISIONAL VENTRAL W/O OBST/GNGR 10/25/2006   Has been seen by surgery in the past and told that this is not dangerous and should only be treated particularly bothersome    . History of acute gouty  arthritis 09/21/2013  . History of colon polyps   . History of Food impaction of esophagus   . HTN (hypertension) 06/29/2006   Isolated Systolic Hypertension - Ambulatory BP  Monitoring result 4/28-29/2015   . Hyperlipidemia 02/27/2015  . Hyperplastic colon polyp 2006   colonoscope  . Hypertension   . Hypochloremia   . Hypoproteinemia ([place]) 09/14/2018  . Hypothyroidism   . Impaired mobility and ADLs 05/30/2014  . Influenza A 05/14/2016  . Insomnia   . INTERSTITIAL CYSTITIS 09/21/2009   Qualifier: Diagnosis of  By: Sarita Haver  MD, Coralyn Mark    . Loss of appetite 09/14/2018  . Loss of weight 04/05/2010   Qualifier: Diagnosis of  By: Walker Kehr MD, Patrick Jupiter    . Lumbar stenosis   . Macular degeneration, age related, nonexudative 04/18/2014  . Mass of soft tissue of upper arm 10/18/2018  . MASS, LUNG 04/05/2010   Qualifier: Diagnosis of  By: Walker Kehr MD, Patrick Jupiter    . Mobitz type 2 second degree heart block 08/29/2013  . Moderate dementia ([place])   . Narrowing of Distal Esophagus 06/16/2017   Barium Esophagram (06/15/17) Poor esophageal motility. Dilated esophagus with tertiary contractions.  Smooth narrowing distal esophagus similar to the prior study.  Moderate narrowing. Barium tablet passed through this area after several sips of water.   . OA (osteoarthritis)    RIGHT SHOULDER AC JOINT  . Orthostasis 04/28/2016  . OSTEOARTHRITIS, MULTI SITES 06/29/2006   Qualifier: Diagnosis of  By: Benna Dunks    . Osteoporosis   . OSTEOPOROSIS 03/20/2008   Qualifier: Diagnosis of  By: Jerline Pain MD, Anderson Malta    . 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 Peripheral neuropathy   . PERIPHERAL NEUROPATHY 03/29/2010   Qualifier: Diagnosis of  By: Walker Kehr MD, Patrick Jupiter    . Persistent headaches 09/06/2013  . Polymyalgia rheumatica ([place]) 11/08/2013  . Presbyesophagus (senile esophageal dysmotility) 06/16/2017  . Presence of permanent cardiac  pacemaker   . Pressure ulcer of right posterior thigh, unspecified pressure ulcer stage 08/26/2019  . Rib pain on left side 10/05/2015  . Right knee pain 01/15/2014  . Right rotator cuff tear   . Rotator cuff tear arthropathy 09/11/2012   Likely related to rotator cuff tendinopathy seen on musculoskeletal ultrasound, and GH DJD.  Complete tear of right supra and infraspinatus on MRI 10/2012     . S/P shoulder replacement 03/20/2015  . Shortness of breath dyspnea   . Spinal stenosis of lumbar region 10/03/2012   Patient has significant low back pain related to spinal stenosis and DJD, DDD.  We had a long discussion about treatment options which are quite limited. She is likely not a good surgical candidate for laminectomy. I am not sure  about the usefulness of epidural steroid injection. We'll plan on restarting meloxicam after a long discussion of the pros and cons and risks of chronic kidney disease.  The patient and her family feel that the benefit of good pain control is the small risk.  Additionally she will followup with her primary care provider for creatinine monitoring.  Will obtain physical therapy for TENS unit trial.  Will refer to pain management for consideration of epidural steroid injection as that worked well in the past for cervical pain Followup here as needed   . Syncope and collapse 02/09/2013  . Urinary frequency 06/05/2015  . Urinary incontinence, mixed 06/29/2006   Qualifier: Diagnosis of  By: Benna Dunks    . UTI (urinary tract infection) 05/27/2016  . UTI (urinary tract infection) 05/2017  . Vertigo 10/18/2010  . Weakness 08/29/2013    Patient Active Problem List   Diagnosis Date Noted  . Closed right fibular fracture 10/17/2019  . Anemia 10/17/2019  . Aspiration pneumonia ([place]) 10/16/2019  . Pressure ulcer of right posterior thigh, unspecified pressure ulcer stage 08/26/2019  . Encounter for care of pacemaker   . Dependent edema 09/14/2018  . Ankle edema, bilateral  08/09/2018  . Moderate protein-calorie malnutrition ([place]) 06/29/2018  . Chronic anticoagulation 06/05/2018  . Generalized pain 02/09/2018  . Bronchiectasis () 10/03/2017  . Paroxysmal atrial fibrillation ([place]) 10/02/2017  . Complete heart block ([place]) S/P permanent pacemaker 2015 08/30/2017  . Presbyesophagus (senile esophageal dysmotility) 06/16/2017  . Generalized osteoarthritis of multiple sites 06/15/2017  . Encounter for long-term opiate analgesic use 04/07/2017  . Weight loss, unintentional 08/12/2016  . Idiopathic gout 05/05/2016  . Atherosclerosis of aorta ([place]) 10/05/2015  . Calcification of native coronary artery 10/05/2015  . Chronic bilateral thoracic back pain 10/05/2015  . Thrombocytopenia ([place]) 09/21/2015  . Esophageal dysmotility, Severe   . Hyperlipidemia 02/27/2015  . GERD (gastroesophageal reflux disease) 07/25/2014  . Complete immobility due to severe physical disability or frailty ([place]) 05/30/2014  . Chronic pain syndrome 05/09/2014  . Glaucoma 04/18/2014  . Macular degeneration, age related, nonexudative 04/18/2014  . Combined visual hearing impairment 11/12/2013  . History of acute gouty arthritis 09/21/2013  . 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 CKD stage G3b/A1, GFR 30-44 and albumin creatinine ratio <30 mg/g 09/03/2013  . Pacemaker Dual Chamber Medtronic Adapta L ADDR 08/30/2013  . Constipation 08/23/2013  . Alzheimer's dementia ([place]) 07/30/2013  . Gait instability 08/28/2012  . Xerostomia 01/24/2012  . Insomnia 06/14/2010  . Hereditary and idiopathic peripheral neuropathy 03/29/2010  . Osteoporosis 03/20/2008  . Hypothyroidism 06/29/2006  . Depressive disorder 06/29/2006  . HTN (hypertension) 06/29/2006  . CYSTOCELE/RECTOCELE/PROLAPSE,UNSPEC. 06/29/2006  . Urinary incontinence, mixed 06/29/2006  . Osteoarthritis of left hip 06/29/2006    Past Surgical History:  Procedure Laterality Date    . CARPAL TUNNEL RELEASE Right   . ESOPHAGOGASTRODUODENOSCOPY N/A 09/15/2015   Procedure: ESOPHAGOGASTRODUODENOSCOPY (EGD);  Surgeon: Clarene Essex, MD;  Location: [facility] ENDOSCOPY;  Service: Endoscopy;  Laterality: N/A;  . ESOPHAGOGASTRODUODENOSCOPY (EGD) WITH PROPOFOL N/A 10/21/2019   Procedure: ESOPHAGOGASTRODUODENOSCOPY (EGD) WITH PROPOFOL;  Surgeon: Wonda Horner, MD;  Location: [facility] ENDOSCOPY;  Service: Endoscopy;  Laterality: N/A;  . EYE SURGERY     cataract, bilat.  Randolm Idol / REPLACE / REMOVE PACEMAKER    . LOOP RECORDER IMPLANT  02-08-13; 08-30-13   MDT LinQ implanted by Dr Lovena Le for syncope, explanted 08-30-13 after CHB identified  . LOOP RECORDER IMPLANT N/A 02/11/2013  Procedure: LOOP RECORDER IMPLANT;  Surgeon: Evans Lance, MD;  Location: [facility] CATH LAB;  Service: Cardiovascular;  Laterality: N/A;  . PACEMAKER INSERTION  08-30-2013   MDT ADDRL1 pacemaker implanted by Dr Rayann Heman for CHB  . PERMANENT PACEMAKER INSERTION N/A 08/30/2013   Procedure: PERMANENT PACEMAKER INSERTION;  Surgeon: Coralyn Mark, MD;  Location: [place] CATH LAB;  Service: Cardiovascular;  Laterality: N/A;  . REVERSE SHOULDER ARTHROPLASTY Right 03/20/2015   Procedure: RIGHT REVERSE SHOULDER ARTHROPLASTY;  Surgeon: Netta Cedars, MD;  Location: [facility];  Service: Orthopedics;  Laterality: Right;  . TOTAL ABDOMINAL HYSTERECTOMY W/ BILATERAL SALPINGOOPHORECTOMY  03-15-2005     OB History    Gravida  0   Para  0   Term  0   Preterm  0   AB  0   Living        SAB  0   TAB  0   Ectopic  0   Multiple      Live Births              Family History  Problem Relation Age of Onset  . Kidney disease Mother   . Heart attack Neg Hx   . Stroke Neg Hx     Social History   Tobacco Use  . Smoking status: Never Smoker  . Smokeless tobacco: Never Used  Vaping Use  . Vaping Use: Never used  Substance Use Topics  . Alcohol use: No    Alcohol/week: 0.0 standard drinks  . Drug use: No    Home Medications Prior  to Admission medications   Medication Sig Start Date End Date Taking? Authorizing Provider  acetaminophen (TYLENOL) 650 MG CR tablet Take 650 mg by mouth 2 (two) times daily as needed for pain.   Yes [provider]  allopurinol (ZYLOPRIM) 100 MG tablet TAKE 1 TABLET(100 MG) BY MOUTH DAILY Patient taking differently: Take 100 mg by mouth daily.  03/13/19  Yes McDiarmid, Blane Ohara, MD  colchicine 0.6 MG tablet TAKE 1 TABLET BY MOUTH TWICE DAILY Patient taking differently: Take 0.6 mg by mouth 2 (two) times daily.  03/16/20  Yes McDiarmid, Blane Ohara, MD  cycloSPORINE (RESTASIS) 0.05 % ophthalmic emulsion Place 1 drop into both eyes at bedtime.   Yes [provider]  diclofenac Sodium (VOLTAREN) 1 % GEL Apply 2 g topically 4 (four) times daily.  03/13/19  Yes [provider]  docusate sodium (COLACE) 100 MG capsule Take 2 capsules (200 mg total) by mouth 2 (two) times daily as needed for mild constipation. 10/23/19  Yes Samuella Cota, MD  fesoterodine (TOVIAZ) 4 MG TB24 tablet Take 1 tablet (4 mg total) by mouth daily. 08/22/19  Yes McDiarmid, Blane Ohara, MD  furosemide (LASIX) 20 MG tablet TAKE 1 TABLET BY MOUTH DAILY AS NEEDED FOR SWELLING Patient taking differently: Take 20 mg by mouth daily as needed for edema.  01/07/20  Yes Adrian Prows, MD  gabapentin (NEURONTIN) 100 MG capsule TAKE 2 CAPSULES BY MOUTH TWICE DAILY Patient taking differently: Take 200 mg by mouth in the morning and at bedtime.  11/20/19  Yes McDiarmid, Blane Ohara, MD  levothyroxine (SYNTHROID) 100 MCG tablet TAKE 1 TABLET BY MOUTH ONCE DAILY Patient taking differently: Take 100 mcg by mouth daily before breakfast.  11/29/19  Yes McDiarmid, Blane Ohara, MD  lidocaine (XYLOCAINE) 5 % ointment APPLY 1 APPLICATION TOPICALLY DAILY AS NEEDED FOR BACK PAIN Patient taking differently: Apply 1 application topically daily as needed for mild  pain.  01/08/20  Yes McDiarmid, Blane Ohara, MD  mirtazapine (REMERON) 15 MG tablet Take 1 tablet  (15 mg total) by mouth at bedtime. 06/10/19  Yes McDiarmid, Blane Ohara, MD  Nutritional Supplements (FEEDING SUPPLEMENT, NEPRO CARB STEADY,) LIQD Take 237 mLs by mouth 2 (two) times daily between meals. 10/24/19  Yes Samuella Cota, MD  omeprazole (PRILOSEC) 20 MG capsule TAKE 1 CAPSULE(20 MG) BY MOUTH DAILY Patient taking differently: Take 20 mg by mouth daily.  08/06/19  Yes McDiarmid, Blane Ohara, MD  ondansetron (ZOFRAN ODT) 4 MG disintegrating tablet Take 1 tablet (4 mg total) by mouth every 8 (eight) hours as needed for nausea or vomiting. 09/23/19  Yes McDiarmid, Blane Ohara, MD  polyethylene glycol (MIRALAX / GLYCOLAX) 17 g packet Take 17 g by mouth daily as needed for moderate constipation. take 17 grams by mouth once daily 10/23/19  Yes Samuella Cota, MD  traMADol (ULTRAM) 50 MG tablet Take 1 tablet (50 mg total) by mouth every 6 (six) hours as needed for moderate pain or severe pain. 01/08/20  Yes McDiarmid, Blane Ohara, MD  TRAVATAN Z 0.004 % SOLN ophthalmic solution Place 1 drop into both eyes at bedtime.  02/20/13  Yes [provider]  traZODone (DESYREL) 50 MG tablet TAKE 1 TABLET BY MOUTH AT BEDTIME Patient taking differently: Take 50 mg by mouth at bedtime.  09/05/19  Yes McDiarmid, Blane Ohara, MD  ELIQUIS 2.5 MG TABS tablet TAKE 1 TABLET(2.5 MG) BY MOUTH TWICE DAILY Patient not taking: TAKE 1 TABLET(2.5 MG) BY MOUTH TWICE DAILY 02/24/20   Adrian Prows, MD  metoprolol succinate (TOPROL-XL) 25 MG 24 hr tablet Take 1 tablet (25 mg total) by mouth daily as needed (high blood pressure). Patient not taking: Reported on 01/08/2020 10/23/19   Samuella Cota, MD  Wound Dressings (ALLEVYN LIFE SACRUM) PADS Apply 1 Units topically as needed. 11/26/19   McDiarmid, Blane Ohara, MD    Allergies    Chlorthalidone, Honey bee treatment [bee venom], Verapamil, Adhesive [tape], and Amlodipine  Review of Systems   Review of Systems  Musculoskeletal: Positive for arthralgias and joint swelling.  Neurological: Positive  for numbness.  All other systems reviewed and are negative.   Physical Exam Updated Vital Signs BP (!) 108/39   Pulse 71   Temp (!) 97.5 F (36.4 C) (Oral)   Resp 11   SpO2 98%   Physical Exam Vitals and nursing note reviewed.  Constitutional:      Appearance: She is well-developed.     Comments: Frail, chronically ill-appearing  HENT:     Head: Normocephalic and atraumatic.  Eyes:     Conjunctiva/sclera: Conjunctivae normal.  Cardiovascular:     Rate and Rhythm: Normal rate and regular rhythm.  Pulmonary:     Effort: Pulmonary effort is normal.  Abdominal:     Palpations: Abdomen is soft.  Musculoskeletal:     Comments: Right leg appears shortened and externally rotated.  There is swelling and deformity to the left hip/thigh  Skin:    General: Skin is warm.  Neurological:     Mental Status: She is alert.  Psychiatric:        Behavior: Behavior normal.     ED Results / Procedures / Treatments   Labs (all labs ordered are listed, but only abnormal results are displayed) Labs Reviewed  RESPIRATORY PANEL BY RT PCR (FLU A&B, COVID)  CBC WITH DIFFERENTIAL/PLATELET  TYPE AND SCREEN    EKG None  Radiology DG  Chest Port 1 View  Result Date: 03/22/2020 CLINICAL DATA:  Hip deformity.  Preoperative chest x-ray. EXAM: PORTABLE CHEST 1 VIEW COMPARISON:  10/20/2019 FINDINGS: Cardiac pacemaker. Heart size and pulmonary vascularity are normal. Emphysematous changes in the lungs with scattered fibrosis and probable bronchiectasis. Nodular scarring in the right apex is similar to previous study. No pleural effusion or pneumothorax. No focal consolidation. Mediastinal contours appear intact. Calcified and tortuous aorta. Degenerative changes in the spine. Postoperative change in the right shoulder. IMPRESSION: Emphysematous changes and fibrosis in the lungs. No evidence of active pulmonary disease. Electronically Signed   By: [name] M.D.   On: 03/22/2020 22:13   DG Hip  Unilat With Pelvis 2-3 Views Right  Result Date: 03/22/2020 CLINICAL DATA:  Hip deformity EXAM: DG HIP (WITH OR WITHOUT PELVIS) 2-3V RIGHT COMPARISON:  Right femur 10/11/2017 FINDINGS: Acute comminuted fracture of the inter trochanteric right proximal femur. Varus angulation of the fracture fragments. Displaced lesser trochanteric fragments. Sclerosis in the fracture line corresponds to an area of pre-existing sclerosis, possibly old infarct or enchondroma. A pathologic fracture is not excluded. Diffuse bone demineralization. Degenerative changes in the hip joint. Vascular calcifications. IMPRESSION: Acute comminuted intertrochanteric fracture of the right proximal femur with varus angulation of the fracture fragments. Electronically Signed   By: [name] M.D.   On: 03/22/2020 22:12    Procedures Procedures (including critical care time)  Medications Ordered in ED Medications  fentaNYL (SUBLIMAZE) injection 25 mcg (has no administration in time range)  acetaminophen (TYLENOL) tablet 650 mg (has no administration in time range)  fentaNYL (SUBLIMAZE) injection 25 mcg (25 mcg Intravenous Given 03/22/20 2140)    ED Course  I have reviewed the triage vital signs and the nursing notes.  Pertinent labs & imaging results that were available during my care of the patient were reviewed by me and considered in my medical decision making (see chart for details).    MDM Rules/Calculators/A&P                          Patient is 86 year old female on hospice care at home, presenting with acute right hip fracture after mechanical fall.  Patient has comminuted intertrochanteric fracture on the right.  She is palpable DP pulse though does report some decreased sensation to the leg.  Pain is managed well with fentanyl.  Patient is ambulatory at baseline with a walker.  Consult placed to orthopedics, Dr. Alvan Dame recommends medical admission, Ortho to evaluate in the morning to discuss options.  Patient  admitted to family medicine service.   Final Clinical Impression(s) / ED Diagnoses Final diagnoses:  Closed displaced intertrochanteric fracture of right femur, initial encounter [facility])    Rx / [place] Orders ED Discharge Orders    None       Keynan Heffern, [place] N, PA-C 03/22/20 2256    Dorie Rank, MD 03/23/20 [phone]

## 2020-03-22 NOTE — H&P (Addendum)
[facility] Admission History and Physical Service Pager: [phone]  [name] Medical record number: [id number] Date of birth: [birth year] Age: 86 y.o. Gender: female  Primary Care Provider: McDiarmid, Blane Ohara, MD Consultants: Ortho Code Status: DNR  Preferred Emergency Contact: Daughter  Chief Complaint: R hip pain, fall   Assessment and Plan: Jordan [name] is a 86 y.o. female presenting with hip pain after fall, found to have a right hip fracture. PMH is significant for Alzheimer's dementia, bronchiectasis, Gout, CKD IV, HFpEF, HTN, and Afib/complete heart block with pacemaker not currently on Anticoagulation.  R comminuted femur fracture s/p fall: Acute. Pt presenting after bed height, largely mechanical, fall and subsequently landed on her right hip, reassuringly without any s/sx concerning for preceding pre-/syncope.  Did not hit head during fall.  Has decreased sensation below hip.  Otherwise neurovascularly intact.  Swelling around joint with external rotation of hip noted on exam.  Hip XR showing an acute comminuted intertrochanteric fracture of the right proximal femur with varus angulation of the fracture fragments.  Received dose of Fentanyl 25 mcg in ED for pain.  She has known osteoporosis and currently in hospice care, will not further evaluate for osteoporotic prevention. - Admit to MedSurg, Attending Dr Erin Hearing -Orthopedic surgery consulted in ED, appreciate recommendations in challenging situation - Vital signs q4hr - Tylenol 325 mg q6hr  - Fentanyl 25 mcg q4hr PRN for breakthrough pain (closely monitoring BP and mental status)  - AM CBC  L hip pain s/p fall: Acute.  Pt additionally reports minimal pain on this side after her fall last night.  Mildly tender on palpation around femoral head with appropriate strength, however no associated swelling. -Obtain left hip x-ray  Hospice care  Fragility Unfortunately under difficult  situation given presenting concern as above.  Will need extensive conversation with patient and family regarding risks/benefits associated with proceeding with surgery vs conservative care understanding she will have significantly reduced mobility.  Macrocytic anemia: Acute on chronic. Hemoglobin at admission 7.3 baseline is 8-9.  MCV 103.8.  Suspect acute difference is likely related to refracture  +/-progression of chronic disease/malnutrition.  We will hold off on further work-up at this time given her current hospice status. -Monitor CBC -Discussed wishes with patient/family in regards to blood transfusions if needed  Hyponatremia Sodium 129 on admission, unclear onset.  Asymptomatic.  Suspect hypovolemic, may be related to "tea and toast" diet.  Additionally considered medication related, as she uses Lasix very rarely doubt this is contributing.  Glucose WNL. - Repeat BMP at noon -Due to lower blood pressures, provided with NS gentle hydration which will likely improve this as well  Bronchiectasis: Chronic. Baseline 2L oxygen nightly, although daughter reports for comfort she has been wearing 2L at all times.  Currently satting 100% on home therapy. -Continue oxygen therapy  Atrial fibrillation/complete heart block now pacemaker dependent: Chronic. Pacemaker in place and dependent rhythm.  Discontinued Eliquis upon transition to hospice, stopped about 10 days ago. -Will monitor as needed  HFpEF  Last echo in June 2021. Left ventricular ejection fraction 60 to 65%.  Echo otherwise normal.  On Lasix 20 mg daily as needed for edema.  No edema on exam, actually appears volume down. -Hold home Lasix for now, can add back in future if needed  CKD stage IIIb: Stable. Cr 1.27, at baseline. -BMP at noon for hyponatremia as above   Thrombocytopenia: Chronic, stable. Platelets 94 at baseline. -Monitor CBC  Hypothyroidism  Last TSH in July 2021 was 0.798.  On Synthroid 153mcg at  home. -Continue home Synthroid  Gout On colchicine 0.6 mg twice daily and allopurinol 100 mg daily at home. -Hold colchicine  -Continue home allopurinol to start 11/22  Chronic back pain On 50 mg tramadol every 6 hours as needed and gabapentin 200 twice daily at home. -Hold tramadol and gabapentin given acute use of fentanyl, can transition pending orthopedic plan  Depression On mirtazapine 50 mg daily and trazodone 50 mg qd at home. -Continue home mirtazapine -Hold home trazodone (likely will not need additional sleep agent with IV pain use)  FEN/GI: NPO in case of intervention, transition to soft diet if not  Prophylaxis: Heparin for one-time dose, will add additional if not proceeding with any surgical intervention  Disposition: Med/Surg  History of Present Illness:  Jordan [name] is a 86 y.o. female presenting with fall.  Daughter in room with Mom who provided Spanish translation for mom.  Reports she was sitting on the bed and was trying to reach something on her bedside table and fell onto her right hip after sliding out of the bed. Reports she did feel a little lightheaded when moving from laying down to sitting, however can often have that if she changes positions too quickly. Fell onto carpet. Son came to her right away. Did not hit her head or LOC, denies any proceeding SOB, CP, dizziness. Acutely painful and swollen. Leg feels numb and weak. Uses a walker to get around at baseline. L hip also hurts but not nearly as severe as the right.   She is currently under hospice care and had otherwise been at baseline.  Lives with her son at home, however her daughter is also frequently present to assist.  She does not eat much, follows a soft diet at home.  Ensure shakes cause GI upset for her.  ED course: On arrival she was afebrile and hemodynamically stable.  A right hip x-ray was obtained showing an acute comminuted fracture within the femur.  Orthopedic surgery was consulted by ED  provider and discussed they would evaluate the patient in the morning.  She was provided IV fentanyl for pain relief.   Review Of Systems: Per HPI with the following additions:   Review of Systems  Constitutional: Negative for chills and fever.  Respiratory: Negative for shortness of breath.   Cardiovascular: Negative for chest pain.  Neurological: Positive for weakness and numbness. Negative for dizziness and headaches.       Endorses numbness and weakness in right leg     Patient Active Problem List   Diagnosis Date Noted  . Fall 03/23/2020  . Closed right fibular fracture 10/17/2019  . Anemia 10/17/2019  . Aspiration pneumonia ([place]) 10/16/2019  . Pressure ulcer of right posterior thigh, unspecified pressure ulcer stage 08/26/2019  . Encounter for care of pacemaker   . Dependent edema 09/14/2018  . Ankle edema, bilateral 08/09/2018  . Moderate protein-calorie malnutrition ([place]) 06/29/2018  . Chronic anticoagulation 06/05/2018  . Generalized pain 02/09/2018  . Bronchiectasis ([place]) 10/03/2017  . Paroxysmal atrial fibrillation ([place]) 10/02/2017  . Complete heart block ([place]) S/P permanent pacemaker 2015 08/30/2017  . Presbyesophagus (senile esophageal dysmotility) 06/16/2017  . Generalized osteoarthritis of multiple sites 06/15/2017  . Encounter for long-term opiate analgesic use 04/07/2017  . Weight loss, unintentional 08/12/2016  . Idiopathic gout 05/05/2016  . Atherosclerosis of aorta ([place]) 10/05/2015  . Calcification of native coronary artery 10/05/2015  . Chronic bilateral thoracic  back pain 10/05/2015  . Thrombocytopenia ([place]) 09/21/2015  . Esophageal dysmotility, Severe   . Hyperlipidemia 02/27/2015  . GERD (gastroesophageal reflux disease) 07/25/2014  . Complete immobility due to severe physical disability or frailty ([place]) 05/30/2014  . Chronic pain syndrome 05/09/2014  . Glaucoma 04/18/2014  . Macular degeneration, age related, nonexudative 04/18/2014  . Combined  visual hearing impairment 11/12/2013  . History of acute gouty arthritis 09/21/2013  . 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 CKD stage G3b/A1, GFR 30-44 and albumin creatinine ratio <30 mg/g 09/03/2013  . Pacemaker Dual Chamber Medtronic Adapta L ADDR 08/30/2013  . Constipation 08/23/2013  . Alzheimer's dementia ([place]) 07/30/2013  . Gait instability 08/28/2012  . Xerostomia 01/24/2012  . Insomnia 06/14/2010  . Hereditary and idiopathic peripheral neuropathy 03/29/2010  . Osteoporosis 03/20/2008  . Hypothyroidism 06/29/2006  . Depressive disorder 06/29/2006  . HTN (hypertension) 06/29/2006  . CYSTOCELE/RECTOCELE/PROLAPSE,UNSPEC. 06/29/2006  . Urinary incontinence, mixed 06/29/2006  . Osteoarthritis of left hip 06/29/2006    Past Medical History: Past Medical History:  Diagnosis Date  . Acute gout of right knee 09/14/2018   Resolved. Continue allopurinol and cochicine prophylaxis  . Acute hip pain, left 06/29/2018  . Acute pancreatitis 04/05/2018  . AKI (acute kidney injury) ([place])   . Alzheimer's dementia [facility]) 07/30/2013   04/17/14 MoCA - Blind (Spanish version); 9 out of 22.  Correct MoCA = 12 out of 30.    . Arthritis   . Atherosclerosis of aorta ([place]) 10/05/2015  . Back pain 10/05/2015  . Bronchiectasis ([place]) 10/03/2017  . Calcification of native coronary artery 10/05/2015   Chest CT 2011.   . Carpal tunnel syndrome 06/29/2006   Qualifier: Diagnosis of  By: Benna Dunks    . Cervicalgia 05/18/2009   Qualifier: Diagnosis of  By: Walker Kehr MD, Patrick Jupiter    . CHF (congestive heart failure) ([place])   . Chronic bilateral thoracic back pain 10/05/2015  . Chronic constipation   . Chronic diastolic heart failure ([place]) 09/01/2013  . Chronic pain syndrome 05/09/2014  . CKD (chronic kidney disease) stage 3, GFR 30-59 ml/min 09/03/2013  . Collapsed vertebra due to osteoporosis ([place]) 10/06/2015   Multiple thoracolumbar vertebral body collapses Chest CT 2018  .  Combined visual hearing impairment 11/12/2013  . Complete heart block ([place])   . Complete immobility due to severe physical disability or frailty ([place]) 05/30/2014  . Constipation   . Cryptogenic stroke ([place]) S/P  LOOP RECORDER IMPLANT   DX  01/2013  --  SYNCOPAL EPISODE --  EPISODIC  ATRIAL TACHY/ FIBULATION  AND PAUSES  . CYSTOCELE/RECTOCELE/PROLAPSE,UNSPEC. 06/29/2006   Qualifier: Diagnosis of  By: Benna Dunks    . Depressive disorder 06/29/2006       . Dilation of esophagus 06/16/2017   Barium Esophagram 06/25/17: Poor esophageal motility. Dilated esophagus with tertiary contractions.  Smooth narrowing distal esophagus similar to the prior study.  Moderate narrowing. Barium tablet passed through this area after several sips of water.  Marland Kitchen Displaced fracture of fifth metatarsal bone, right foot, subsequent encounter for fracture with routine healing 06/05/2017  . Diverticulosis of colon   . DIVERTICULOSIS OF COLON 06/29/2006   Qualifier: Diagnosis of  By: Benna Dunks    . Dry eyes   . Dysphagia 09/15/2015  . Encounter for care of pacemaker   . Fall from bed 03/17/2016  . Fatigue   . Food impaction of esophagus   . Frequency of urination   . Gait instability   .  Generalized osteoarthritis of multiple sites 06/15/2017  . Glaucoma 04/18/2014  . Glaucoma of both eyes   . Gout 09/21/2013  . Hand Heaviness 03/17/2016  . Headache   . Hearing loss of aging 05/09/2014  . HERNIA, INCISIONAL VENTRAL W/O OBST/GNGR 10/25/2006   Has been seen by surgery in the past and told that this is not dangerous and should only be treated particularly bothersome    . History of acute gouty arthritis 09/21/2013  . History of colon polyps   . History of Food impaction of esophagus   . HTN (hypertension) 06/29/2006   Isolated Systolic Hypertension - Ambulatory BP  Monitoring result 4/28-29/2015   . Hyperlipidemia 02/27/2015  . Hyperplastic colon polyp 2006   colonoscope  . Hypertension   . Hypochloremia   .  Hypoproteinemia ([place]) 09/14/2018  . Hypothyroidism   . Impaired mobility and ADLs 05/30/2014  . Influenza A 05/14/2016  . Insomnia   . INTERSTITIAL CYSTITIS 09/21/2009   Qualifier: Diagnosis of  By: Sarita Haver  MD, Coralyn Mark    . Loss of appetite 09/14/2018  . Loss of weight 04/05/2010   Qualifier: Diagnosis of  By: Walker Kehr MD, Patrick Jupiter    . Lumbar stenosis   . Macular degeneration, age related, nonexudative 04/18/2014  . Mass of soft tissue of upper arm 10/18/2018  . MASS, LUNG 04/05/2010   Qualifier: Diagnosis of  By: Walker Kehr MD, Patrick Jupiter    . Mobitz type 2 second degree heart block 08/29/2013  . Moderate dementia ([place])   . Narrowing of Distal Esophagus 06/16/2017   Barium Esophagram (06/15/17) Poor esophageal motility. Dilated esophagus with tertiary contractions.  Smooth narrowing distal esophagus similar to the prior study.  Moderate narrowing. Barium tablet passed through this area after several sips of water.   . OA (osteoarthritis)    RIGHT SHOULDER AC JOINT  . Orthostasis 04/28/2016  . OSTEOARTHRITIS, MULTI SITES 06/29/2006   Qualifier: Diagnosis of  By: Benna Dunks    . Osteoporosis   . OSTEOPOROSIS 03/20/2008   Qualifier: Diagnosis of  By: Jerline Pain MD, Anderson Malta    . 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 Peripheral neuropathy   . PERIPHERAL NEUROPATHY 03/29/2010   Qualifier: Diagnosis of  By: Walker Kehr MD, Patrick Jupiter    . Persistent headaches 09/06/2013  . Polymyalgia rheumatica ([place]) 11/08/2013  . Presbyesophagus (senile esophageal dysmotility) 06/16/2017  . Presence of permanent cardiac pacemaker   . Pressure ulcer of right posterior thigh, unspecified pressure ulcer stage 08/26/2019  . Rib pain on left side 10/05/2015  . Right knee pain 01/15/2014  . Right rotator cuff tear   . Rotator cuff tear arthropathy 09/11/2012   Likely related to rotator cuff tendinopathy seen on musculoskeletal ultrasound, and GH DJD.   Complete tear of right supra and infraspinatus on MRI 10/2012     . S/P shoulder replacement 03/20/2015  . Shortness of breath dyspnea   . Spinal stenosis of lumbar region 10/03/2012   Patient has significant low back pain related to spinal stenosis and DJD, DDD.  We had a long discussion about treatment options which are quite limited. She is likely not a good surgical candidate for laminectomy. I am not sure about the usefulness of epidural steroid injection. We'll plan on restarting meloxicam after a long discussion of the pros and cons and risks of chronic kidney disease.  The patient and her family feel that the benefit of good pain  control is the small risk.  Additionally she will followup with her primary care provider for creatinine monitoring.  Will obtain physical therapy for TENS unit trial.  Will refer to pain management for consideration of epidural steroid injection as that worked well in the past for cervical pain Followup here as needed   . Syncope and collapse 02/09/2013  . Urinary frequency 06/05/2015  . Urinary incontinence, mixed 06/29/2006   Qualifier: Diagnosis of  By: Benna Dunks    . UTI (urinary tract infection) 05/27/2016  . UTI (urinary tract infection) 05/2017  . Vertigo 10/18/2010  . Weakness 08/29/2013    Past Surgical History: Past Surgical History:  Procedure Laterality Date  . CARPAL TUNNEL RELEASE Right   . ESOPHAGOGASTRODUODENOSCOPY N/A 09/15/2015   Procedure: ESOPHAGOGASTRODUODENOSCOPY (EGD);  Surgeon: Clarene Essex, MD;  [facility]COPY;  Service: Endoscopy;  Laterality: N/A;  . ESOPHAGOGASTRODUODENOSCOPY (EGD) WITH PROPOFOL N/A 10/21/2019   Procedure: ESOPHAGOGASTRODUODENOSCOPY (EGD) WITH PROPOFOL;  Surgeon: Wonda Horner, MD;  Location: [facility] ENDOSCOPY;  Service: Endoscopy;  Laterality: N/A;  . EYE SURGERY     cataract, bilat.  Randolm Idol / REPLACE / REMOVE PACEMAKER    . LOOP RECORDER IMPLANT  02-08-13; 08-30-13   MDT LinQ implanted by Dr Lovena Le for syncope,  explanted 08-30-13 after CHB identified  . LOOP RECORDER IMPLANT N/A 02/11/2013   Procedure: LOOP RECORDER IMPLANT;  Surgeon: Evans Lance, MD;  Location: [facility] CATH LAB;  Service: Cardiovascular;  Laterality: N/A;  . PACEMAKER INSERTION  08-30-2013   MDT [id number] pacemaker implanted by Dr Rayann Heman for CHB  . PERMANENT PACEMAKER INSERTION N/A 08/30/2013   Procedure: PERMANENT PACEMAKER INSERTION;  Surgeon: Coralyn Mark, MD;  Location: [place] CATH LAB;  Service: Cardiovascular;  Laterality: N/A;  . REVERSE SHOULDER ARTHROPLASTY Right 03/20/2015   Procedure: RIGHT REVERSE SHOULDER ARTHROPLASTY;  Surgeon: Netta Cedars, MD;  Location: [place];  Service: Orthopedics;  Laterality: Right;  . TOTAL ABDOMINAL HYSTERECTOMY W/ BILATERAL SALPINGOOPHORECTOMY  03-15-2005    Social History: Social History   Tobacco Use  . Smoking status: Never Smoker  . Smokeless tobacco: Never Used  Vaping Use  . Vaping Use: Never used  Substance Use Topics  . Alcohol use: No    Alcohol/week: 0.0 standard drinks  . Drug use: No   Additional social history:  Please also refer to relevant sections of EMR.  Family History: Family History  Problem Relation Age of Onset  . Kidney disease Mother   . Heart attack Neg Hx   . Stroke Neg Hx      Allergies and Medications: Allergies  Allergen Reactions  . Chlorthalidone Other (See Comments)    Hyponatremia  . Honey Bee Treatment [Bee Venom] Swelling and Rash  . Verapamil Other (See Comments)    Bradycardia - HR  35-45 on 40mg  TID  . Adhesive [Tape] Rash and Other (See Comments)    Skin irritation  . Amlodipine Other (See Comments)    Peripheral edema with 2.5mg  daily dose   No current facility-administered medications on file prior to encounter.   Current Outpatient Medications on File Prior to Encounter  Medication Sig Dispense Refill  . acetaminophen (TYLENOL) 650 MG CR tablet Take 650 mg by mouth 2 (two) times daily as needed for pain.    Marland Kitchen allopurinol (ZYLOPRIM)  100 MG tablet TAKE 1 TABLET(100 MG) BY MOUTH DAILY (Patient taking differently: Take 100 mg by mouth daily. ) 90 tablet 3  . colchicine 0.6 MG tablet TAKE 1 TABLET  BY MOUTH TWICE DAILY (Patient taking differently: Take 0.6 mg by mouth 2 (two) times daily. ) 240 tablet 3  . cycloSPORINE (RESTASIS) 0.05 % ophthalmic emulsion Place 1 drop into both eyes at bedtime.    . diclofenac Sodium (VOLTAREN) 1 % GEL Apply 2 g topically 4 (four) times daily.     Marland Kitchen docusate sodium (COLACE) 100 MG capsule Take 2 capsules (200 mg total) by mouth 2 (two) times daily as needed for mild constipation.    . fesoterodine (TOVIAZ) 4 MG TB24 tablet Take 1 tablet (4 mg total) by mouth daily. 30 tablet PRN  . furosemide (LASIX) 20 MG tablet TAKE 1 TABLET BY MOUTH DAILY AS NEEDED FOR SWELLING (Patient taking differently: Take 20 mg by mouth daily as needed for edema. ) 30 tablet 6  . gabapentin (NEURONTIN) 100 MG capsule TAKE 2 CAPSULES BY MOUTH TWICE DAILY (Patient taking differently: Take 200 mg by mouth in the morning and at bedtime. ) 360 capsule 5  . levothyroxine (SYNTHROID) 100 MCG tablet TAKE 1 TABLET BY MOUTH ONCE DAILY (Patient taking differently: Take 100 mcg by mouth daily before breakfast. ) 90 tablet PRN  . lidocaine (XYLOCAINE) 5 % ointment APPLY 1 APPLICATION TOPICALLY DAILY AS NEEDED FOR BACK PAIN (Patient taking differently: Apply 1 application topically daily as needed for mild pain. ) 150 g 2  . mirtazapine (REMERON) 15 MG tablet Take 1 tablet (15 mg total) by mouth at bedtime. 30 tablet 11  . Nutritional Supplements (FEEDING SUPPLEMENT, NEPRO CARB STEADY,) LIQD Take 237 mLs by mouth 2 (two) times daily between meals. 14220 mL 0  . omeprazole (PRILOSEC) 20 MG capsule TAKE 1 CAPSULE(20 MG) BY MOUTH DAILY (Patient taking differently: Take 20 mg by mouth daily. ) 90 capsule 3  . ondansetron (ZOFRAN ODT) 4 MG disintegrating tablet Take 1 tablet (4 mg total) by mouth every 8 (eight) hours as needed for nausea or  vomiting. 20 tablet 0  . polyethylene glycol (MIRALAX / GLYCOLAX) 17 g packet Take 17 g by mouth daily as needed for moderate constipation. take 17 grams by mouth once daily    . traMADol (ULTRAM) 50 MG tablet Take 1 tablet (50 mg total) by mouth every 6 (six) hours as needed for moderate pain or severe pain. 30 tablet 2  . TRAVATAN Z 0.004 % SOLN ophthalmic solution Place 1 drop into both eyes at bedtime.     . traZODone (DESYREL) 50 MG tablet TAKE 1 TABLET BY MOUTH AT BEDTIME (Patient taking differently: Take 50 mg by mouth at bedtime. ) 90 tablet 3  . ELIQUIS 2.5 MG TABS tablet TAKE 1 TABLET(2.5 MG) BY MOUTH TWICE DAILY (Patient not taking: TAKE 1 TABLET(2.5 MG) BY MOUTH TWICE DAILY) 60 tablet 0  . metoprolol succinate (TOPROL-XL) 25 MG 24 hr tablet Take 1 tablet (25 mg total) by mouth daily as needed (high blood pressure). (Patient not taking: Reported on 01/08/2020) 90 tablet 3  . Wound Dressings (ALLEVYN LIFE SACRUM) PADS Apply 1 Units topically as needed. 10 each PRN    Objective: BP (!) 114/45   Pulse 74   Temp (!) 97.5 F (36.4 C) (Oral)   Resp 16   SpO2 100%  Exam: Physical Exam Constitutional:      Comments: Cachectic, chronically ill-appearing  HENT:     Head: Normocephalic and atraumatic.     Mouth/Throat:     Mouth: Mucous membranes are moist.  Cardiovascular:     Rate and Rhythm: Normal rate and  regular rhythm.     Pulses: Normal pulses.  Pulmonary:     Effort: Pulmonary effort is normal.     Breath sounds: Normal breath sounds.  Abdominal:     Palpations: Abdomen is soft.  Musculoskeletal:     Right upper leg: Swelling, deformity and tenderness present. No lacerations.     Left upper leg: Tenderness present. No swelling, deformity or lacerations.     Right foot: Normal range of motion and normal capillary refill. No swelling. Normal pulse.     Left foot: Normal range of motion and normal capillary refill. No swelling. Normal pulse.     Comments: Right leg  externally rotated with notable swelling around femoral head.  Sensation to light touch intact throughout right leg, however decreased in comparison to left.  Able to spontaneously move her right foot/toes.  Palpable dorsalis pedis pulse bilaterally.  Neurological:     Mental Status: She is alert.  Psychiatric:     Comments: Anxious, logical thought process     Labs and Imaging: CBC BMET  No results for input(s): WBC, HGB, HCT, PLT in the last 168 hours. Recent Labs  Lab 03/23/20 0019  NA 129*  K 4.8  CL 94*  CO2 30  BUN 19  CREATININE 1.27*  GLUCOSE 108*  CALCIUM 8.1*       Maness, Arnette Norris, MD 03/23/2020, 1:50 AM PGY-1, [place] Intern pager: [phone], text pages welcome  FPTS Upper-Level Resident Addendum   I have independently interviewed and examined the patient. I have discussed the above with the original author and agree with their documentation. My edits for correction/addition/clarification are added. Please see also any attending notes.    Patriciaann Clan, DO  Family Medicine PGY-3

## 2020-03-22 NOTE — ED Triage Notes (Signed)
BIB [facility] from home. Pt was reaching for something while in bed and fell out. The fall was unwitnessed. Family found pt shortly after and say her up right and pt complaining of right hip pain. No LOC. Pt was taken off of eliquis 10 days ago and currently hospice pt.

## 2020-03-23 ENCOUNTER — Encounter (HOSPITAL_COMMUNITY)
Admission: EM | Disposition: A | Discharge status: Hospice | Payer: Self-pay | Source: Home / Self Care | Attending: Family Medicine

## 2020-03-23 ENCOUNTER — Inpatient Hospital Stay (HOSPITAL_COMMUNITY)

## 2020-03-23 ENCOUNTER — Inpatient Hospital Stay (HOSPITAL_COMMUNITY): Admitting: Anesthesiology

## 2020-03-23 ENCOUNTER — Encounter (HOSPITAL_COMMUNITY): Payer: Self-pay | Admitting: Family Medicine

## 2020-03-23 DIAGNOSIS — G309 Alzheimer's disease, unspecified: Secondary | ICD-10-CM | POA: Diagnosis present

## 2020-03-23 DIAGNOSIS — I1310 Hypertensive heart and chronic kidney disease without heart failure, with stage 1 through stage 4 chronic kidney disease, or unspecified chronic kidney disease: Secondary | ICD-10-CM | POA: Diagnosis not present

## 2020-03-23 DIAGNOSIS — I69318 Other symptoms and signs involving cognitive functions following cerebral infarction: Secondary | ICD-10-CM | POA: Diagnosis not present

## 2020-03-23 DIAGNOSIS — I130 Hypertensive heart and chronic kidney disease with heart failure and stage 1 through stage 4 chronic kidney disease, or unspecified chronic kidney disease: Secondary | ICD-10-CM | POA: Diagnosis not present

## 2020-03-23 DIAGNOSIS — F0280 Dementia in other diseases classified elsewhere without behavioral disturbance: Secondary | ICD-10-CM | POA: Diagnosis present

## 2020-03-23 DIAGNOSIS — N179 Acute kidney failure, unspecified: Secondary | ICD-10-CM | POA: Diagnosis not present

## 2020-03-23 DIAGNOSIS — M353 Polymyalgia rheumatica: Secondary | ICD-10-CM | POA: Diagnosis present

## 2020-03-23 DIAGNOSIS — G894 Chronic pain syndrome: Secondary | ICD-10-CM | POA: Diagnosis present

## 2020-03-23 DIAGNOSIS — J189 Pneumonia, unspecified organism: Secondary | ICD-10-CM | POA: Diagnosis not present

## 2020-03-23 DIAGNOSIS — N189 Chronic kidney disease, unspecified: Secondary | ICD-10-CM | POA: Diagnosis not present

## 2020-03-23 DIAGNOSIS — I700 Atherosclerosis of aorta: Secondary | ICD-10-CM | POA: Diagnosis not present

## 2020-03-23 DIAGNOSIS — Z20822 Contact with and (suspected) exposure to covid-19: Secondary | ICD-10-CM | POA: Diagnosis not present

## 2020-03-23 DIAGNOSIS — W19XXXA Unspecified fall, initial encounter: Secondary | ICD-10-CM | POA: Diagnosis present

## 2020-03-23 DIAGNOSIS — I5032 Chronic diastolic (congestive) heart failure: Secondary | ICD-10-CM | POA: Diagnosis not present

## 2020-03-23 DIAGNOSIS — I2510 Atherosclerotic heart disease of native coronary artery without angina pectoris: Secondary | ICD-10-CM | POA: Diagnosis not present

## 2020-03-23 DIAGNOSIS — W19XXXS Unspecified fall, sequela: Secondary | ICD-10-CM | POA: Diagnosis not present

## 2020-03-23 DIAGNOSIS — I442 Atrioventricular block, complete: Secondary | ICD-10-CM | POA: Diagnosis not present

## 2020-03-23 DIAGNOSIS — R918 Other nonspecific abnormal finding of lung field: Secondary | ICD-10-CM | POA: Diagnosis not present

## 2020-03-23 DIAGNOSIS — J470 Bronchiectasis with acute lower respiratory infection: Secondary | ICD-10-CM | POA: Diagnosis not present

## 2020-03-23 DIAGNOSIS — S72141A Displaced intertrochanteric fracture of right femur, initial encounter for closed fracture: Secondary | ICD-10-CM | POA: Diagnosis not present

## 2020-03-23 DIAGNOSIS — R64 Cachexia: Secondary | ICD-10-CM | POA: Diagnosis not present

## 2020-03-23 DIAGNOSIS — Z419 Encounter for procedure for purposes other than remedying health state, unspecified: Secondary | ICD-10-CM | POA: Diagnosis not present

## 2020-03-23 DIAGNOSIS — E039 Hypothyroidism, unspecified: Secondary | ICD-10-CM | POA: Diagnosis present

## 2020-03-23 DIAGNOSIS — R059 Cough, unspecified: Secondary | ICD-10-CM | POA: Diagnosis not present

## 2020-03-23 DIAGNOSIS — M25559 Pain in unspecified hip: Secondary | ICD-10-CM | POA: Diagnosis not present

## 2020-03-23 DIAGNOSIS — H409 Unspecified glaucoma: Secondary | ICD-10-CM | POA: Diagnosis present

## 2020-03-23 DIAGNOSIS — M810 Age-related osteoporosis without current pathological fracture: Secondary | ICD-10-CM | POA: Diagnosis present

## 2020-03-23 DIAGNOSIS — M1A0610 Idiopathic chronic gout, right knee, without tophus (tophi): Secondary | ICD-10-CM | POA: Diagnosis present

## 2020-03-23 DIAGNOSIS — Z9889 Other specified postprocedural states: Secondary | ICD-10-CM | POA: Diagnosis not present

## 2020-03-23 DIAGNOSIS — E785 Hyperlipidemia, unspecified: Secondary | ICD-10-CM | POA: Diagnosis not present

## 2020-03-23 DIAGNOSIS — W06XXXA Fall from bed, initial encounter: Secondary | ICD-10-CM | POA: Diagnosis present

## 2020-03-23 DIAGNOSIS — Z8679 Personal history of other diseases of the circulatory system: Secondary | ICD-10-CM | POA: Diagnosis not present

## 2020-03-23 DIAGNOSIS — H9190 Unspecified hearing loss, unspecified ear: Secondary | ICD-10-CM | POA: Diagnosis present

## 2020-03-23 DIAGNOSIS — N184 Chronic kidney disease, stage 4 (severe): Secondary | ICD-10-CM | POA: Diagnosis not present

## 2020-03-23 DIAGNOSIS — M25552 Pain in left hip: Secondary | ICD-10-CM | POA: Diagnosis not present

## 2020-03-23 DIAGNOSIS — Z66 Do not resuscitate: Secondary | ICD-10-CM | POA: Diagnosis not present

## 2020-03-23 DIAGNOSIS — R52 Pain, unspecified: Secondary | ICD-10-CM | POA: Diagnosis not present

## 2020-03-23 DIAGNOSIS — I480 Paroxysmal atrial fibrillation: Secondary | ICD-10-CM | POA: Diagnosis not present

## 2020-03-23 DIAGNOSIS — M79604 Pain in right leg: Secondary | ICD-10-CM | POA: Diagnosis present

## 2020-03-23 DIAGNOSIS — E871 Hypo-osmolality and hyponatremia: Secondary | ICD-10-CM | POA: Diagnosis not present

## 2020-03-23 DIAGNOSIS — H353190 Nonexudative age-related macular degeneration, unspecified eye, stage unspecified: Secondary | ICD-10-CM | POA: Diagnosis present

## 2020-03-23 HISTORY — PX: INTRAMEDULLARY (IM) NAIL INTERTROCHANTERIC: SHX5875

## 2020-03-23 LAB — BASIC METABOLIC PANEL
Anion gap: 5 (ref 5–15)
Anion gap: 6 (ref 5–15)
BUN: 19 mg/dL (ref 8–23)
BUN: 20 mg/dL (ref 8–23)
CO2: 26 mmol/L (ref 22–32)
CO2: 30 mmol/L (ref 22–32)
Calcium: 7 mg/dL — ABNORMAL LOW (ref 8.9–10.3)
Calcium: 8.1 mg/dL — ABNORMAL LOW (ref 8.9–10.3)
Chloride: 102 mmol/L (ref 98–111)
Chloride: 94 mmol/L — ABNORMAL LOW (ref 98–111)
Creatinine, Ser: 1.21 mg/dL — ABNORMAL HIGH (ref 0.44–1.00)
Creatinine, Ser: 1.27 mg/dL — ABNORMAL HIGH (ref 0.44–1.00)
GFR, Estimated: 38 mL/min — ABNORMAL LOW (ref >60)
GFR, Estimated: 41 mL/min — ABNORMAL LOW (ref >60)
Glucose, Bld: 108 mg/dL — ABNORMAL HIGH (ref 70–99)
Glucose, Bld: 79 mg/dL (ref 70–99)
Potassium: 4.5 mmol/L (ref 3.5–5.1)
Potassium: 4.8 mmol/L (ref 3.5–5.1)
Sodium: 129 mmol/L — ABNORMAL LOW (ref 135–145)
Sodium: 134 mmol/L — ABNORMAL LOW (ref 135–145)

## 2020-03-23 LAB — CBC WITH DIFFERENTIAL/PLATELET
Abs Immature Granulocytes: 0.03 10*3/uL (ref 0.00–0.07)
Basophils Absolute: 0 10*3/uL (ref 0.0–0.1)
Basophils Relative: 0 %
Eosinophils Absolute: 0 10*3/uL (ref 0.0–0.5)
Eosinophils Relative: 0 %
HCT: 22 % — ABNORMAL LOW (ref 36.0–46.0)
Hemoglobin: 7.3 g/dL — ABNORMAL LOW (ref 12.0–15.0)
Immature Granulocytes: 1 %
Lymphocytes Relative: 8 %
Lymphs Abs: 0.4 10*3/uL — ABNORMAL LOW (ref 0.7–4.0)
MCH: 34.4 pg — ABNORMAL HIGH (ref 26.0–34.0)
MCHC: 33.2 g/dL (ref 30.0–36.0)
MCV: 103.8 fL — ABNORMAL HIGH (ref 80.0–100.0)
Monocytes Absolute: 0.2 10*3/uL (ref 0.1–1.0)
Monocytes Relative: 5 %
Neutro Abs: 4.5 10*3/uL (ref 1.7–7.7)
Neutrophils Relative %: 86 %
Platelets: 94 10*3/uL — ABNORMAL LOW (ref 150–400)
RBC: 2.12 MIL/uL — ABNORMAL LOW (ref 3.87–5.11)
RDW: 17.5 % — ABNORMAL HIGH (ref 11.5–15.5)
WBC: 5.2 10*3/uL (ref 4.0–10.5)
nRBC: 0 % (ref 0.0–0.2)

## 2020-03-23 LAB — RESPIRATORY PANEL BY RT PCR (FLU A&B, COVID)
Influenza A by PCR: NEGATIVE
Influenza B by PCR: NEGATIVE
SARS Coronavirus 2 by RT PCR: NEGATIVE

## 2020-03-23 LAB — PREPARE RBC (CROSSMATCH)

## 2020-03-23 LAB — SURGICAL PCR SCREEN
MRSA, PCR: NEGATIVE
Staphylococcus aureus: POSITIVE — AB

## 2020-03-23 SURGERY — FIXATION, FRACTURE, INTERTROCHANTERIC, WITH INTRAMEDULLARY ROD
Anesthesia: Spinal | Laterality: Right

## 2020-03-23 MED ORDER — PROPOFOL 500 MG/50ML IV EMUL
INTRAVENOUS | Status: DC | PRN
  Administered 2020-03-23: 30 ug/kg/min via INTRAVENOUS

## 2020-03-23 MED ORDER — PANTOPRAZOLE SODIUM 40 MG PO TBEC
40.0000 mg | DELAYED_RELEASE_TABLET | Freq: Every day | ORAL | Status: DC
  Administered 2020-03-23 – 2020-03-31 (×9): 40 mg via ORAL
  Filled 2020-03-23 (×9): qty 1

## 2020-03-23 MED ORDER — METHOCARBAMOL 1000 MG/10ML IJ SOLN
500.0000 mg | Freq: Four times a day (QID) | INTRAVENOUS | Status: DC | PRN
  Filled 2020-03-23: qty 5

## 2020-03-23 MED ORDER — METHOCARBAMOL 500 MG PO TABS
500.0000 mg | ORAL_TABLET | Freq: Four times a day (QID) | ORAL | Status: DC | PRN
  Filled 2020-03-23: qty 1

## 2020-03-23 MED ORDER — CHLORHEXIDINE GLUCONATE 0.12 % MT SOLN
OROMUCOSAL | Status: AC
  Filled 2020-03-23: qty 15

## 2020-03-23 MED ORDER — COLCHICINE 0.6 MG PO TABS: 0.6000 mg | ORAL_TABLET | Freq: Every day | ORAL | Status: DC

## 2020-03-23 MED ORDER — LACTATED RINGERS IV SOLN: INTRAVENOUS | Status: DC | PRN

## 2020-03-23 MED ORDER — LEVOTHYROXINE SODIUM 100 MCG PO TABS
100.0000 ug | ORAL_TABLET | Freq: Every day | ORAL | Status: DC
  Administered 2020-03-24 – 2020-03-31 (×8): 100 ug via ORAL
  Filled 2020-03-23 (×8): qty 1

## 2020-03-23 MED ORDER — ACETAMINOPHEN 325 MG PO TABS
325.0000 mg | ORAL_TABLET | Freq: Four times a day (QID) | ORAL | Status: DC | PRN
  Filled 2020-03-23: qty 2

## 2020-03-23 MED ORDER — DOCUSATE SODIUM 100 MG PO CAPS
100.0000 mg | ORAL_CAPSULE | Freq: Two times a day (BID) | ORAL | Status: DC
  Administered 2020-03-24 – 2020-03-31 (×9): 100 mg via ORAL
  Filled 2020-03-23 (×13): qty 1

## 2020-03-23 MED ORDER — PHENOL 1.4 % MT LIQD: 1.0000 | OROMUCOSAL | Status: DC | PRN

## 2020-03-23 MED ORDER — COLCHICINE 0.6 MG PO TABS: 0.6000 mg | ORAL_TABLET | Freq: Two times a day (BID) | ORAL | Status: DC

## 2020-03-23 MED ORDER — ONDANSETRON HCL 4 MG PO TABS: 4.0000 mg | ORAL_TABLET | Freq: Four times a day (QID) | ORAL | Status: DC | PRN

## 2020-03-23 MED ORDER — SODIUM CHLORIDE 0.9% IV SOLUTION: Freq: Once | INTRAVENOUS | Status: DC

## 2020-03-23 MED ORDER — 0.9 % SODIUM CHLORIDE (POUR BTL) OPTIME
TOPICAL | Status: DC | PRN
  Administered 2020-03-23: 1000 mL

## 2020-03-23 MED ORDER — METOCLOPRAMIDE HCL 5 MG/ML IJ SOLN: 5.0000 mg | Freq: Three times a day (TID) | INTRAMUSCULAR | Status: DC | PRN

## 2020-03-23 MED ORDER — ALLOPURINOL 100 MG PO TABS
100.0000 mg | ORAL_TABLET | Freq: Every day | ORAL | Status: DC
  Administered 2020-03-23 – 2020-03-31 (×9): 100 mg via ORAL
  Filled 2020-03-23 (×9): qty 1

## 2020-03-23 MED ORDER — MENTHOL 3 MG MT LOZG: 1.0000 | LOZENGE | OROMUCOSAL | Status: DC | PRN

## 2020-03-23 MED ORDER — FENTANYL CITRATE (PF) 100 MCG/2ML IJ SOLN
25.0000 ug | INTRAMUSCULAR | Status: DC | PRN
  Administered 2020-03-23 (×2): 25 ug via INTRAVENOUS
  Filled 2020-03-23 (×2): qty 2

## 2020-03-23 MED ORDER — SODIUM CHLORIDE 0.9 % IV SOLN: INTRAVENOUS | Status: AC

## 2020-03-23 MED ORDER — PHENYLEPHRINE HCL (PRESSORS) 10 MG/ML IV SOLN
INTRAVENOUS | Status: DC | PRN
  Administered 2020-03-23 (×2): 120 ug via INTRAVENOUS

## 2020-03-23 MED ORDER — FENTANYL CITRATE (PF) 250 MCG/5ML IJ SOLN
INTRAMUSCULAR | Status: DC | PRN
Start: 2020-03-23 — End: 2020-03-23
  Administered 2020-03-23: 25 ug via INTRAVENOUS

## 2020-03-23 MED ORDER — FENTANYL CITRATE (PF) 100 MCG/2ML IJ SOLN: 25.0000 ug | INTRAMUSCULAR | Status: DC | PRN

## 2020-03-23 MED ORDER — HYDROCODONE-ACETAMINOPHEN 5-325 MG PO TABS: 1.0000 | ORAL_TABLET | Freq: Four times a day (QID) | ORAL | Status: DC | PRN

## 2020-03-23 MED ORDER — FENTANYL CITRATE (PF) 250 MCG/5ML IJ SOLN
INTRAMUSCULAR | Status: AC
  Filled 2020-03-23: qty 5

## 2020-03-23 MED ORDER — MORPHINE SULFATE (PF) 2 MG/ML IV SOLN
0.5000 mg | INTRAVENOUS | Status: DC | PRN
  Administered 2020-03-23: 0.5 mg via INTRAVENOUS
  Administered 2020-03-24: 1 mg via INTRAVENOUS
  Filled 2020-03-23 (×2): qty 1

## 2020-03-23 MED ORDER — PHENYLEPHRINE HCL-NACL 10-0.9 MG/250ML-% IV SOLN
INTRAVENOUS | Status: DC | PRN
  Administered 2020-03-23: 25 ug/min via INTRAVENOUS

## 2020-03-23 MED ORDER — CEFAZOLIN SODIUM-DEXTROSE 2-4 GM/100ML-% IV SOLN
2.0000 g | Freq: Two times a day (BID) | INTRAVENOUS | Status: AC
  Administered 2020-03-24: 2 g via INTRAVENOUS
  Filled 2020-03-23: qty 100

## 2020-03-23 MED ORDER — BUPIVACAINE IN DEXTROSE 0.75-8.25 % IT SOLN
INTRATHECAL | Status: DC | PRN
  Administered 2020-03-23: 1.6 mL via INTRATHECAL

## 2020-03-23 MED ORDER — ONDANSETRON HCL 4 MG/2ML IJ SOLN: 4.0000 mg | Freq: Four times a day (QID) | INTRAMUSCULAR | Status: DC | PRN

## 2020-03-23 MED ORDER — METOCLOPRAMIDE HCL 5 MG PO TABS: 5.0000 mg | ORAL_TABLET | Freq: Three times a day (TID) | ORAL | Status: DC | PRN

## 2020-03-23 MED ORDER — PROPOFOL 10 MG/ML IV BOLUS
INTRAVENOUS | Status: DC | PRN
  Administered 2020-03-23: 30 mg via INTRAVENOUS

## 2020-03-23 MED ORDER — CHLORHEXIDINE GLUCONATE 4 % EX LIQD: 60.0000 mL | Freq: Once | CUTANEOUS | Status: DC

## 2020-03-23 MED ORDER — PROPOFOL 10 MG/ML IV BOLUS
INTRAVENOUS | Status: AC
  Filled 2020-03-23: qty 20

## 2020-03-23 MED ORDER — CEFAZOLIN SODIUM-DEXTROSE 2-4 GM/100ML-% IV SOLN
2.0000 g | INTRAVENOUS | Status: AC
  Administered 2020-03-23: 2 g via INTRAVENOUS
  Filled 2020-03-23: qty 100

## 2020-03-23 MED ORDER — FESOTERODINE FUMARATE ER 4 MG PO TB24
4.0000 mg | ORAL_TABLET | Freq: Every day | ORAL | Status: DC
  Administered 2020-03-23: 4 mg via ORAL
  Filled 2020-03-23 (×2): qty 1

## 2020-03-23 MED ORDER — HEPARIN SODIUM (PORCINE) 5000 UNIT/ML IJ SOLN
5000.0000 [IU] | Freq: Once | INTRAMUSCULAR | Status: AC
  Administered 2020-03-23: 5000 [IU] via SUBCUTANEOUS
  Filled 2020-03-23: qty 1

## 2020-03-23 MED ORDER — POLYETHYLENE GLYCOL 3350 17 G PO PACK
17.0000 g | PACK | Freq: Every day | ORAL | Status: DC | PRN
  Administered 2020-03-26 (×2): 17 g via ORAL
  Filled 2020-03-23 (×2): qty 1

## 2020-03-23 MED ORDER — ASPIRIN EC 81 MG PO TBEC
81.0000 mg | DELAYED_RELEASE_TABLET | Freq: Two times a day (BID) | ORAL | Status: DC
  Administered 2020-03-24: 81 mg via ORAL
  Filled 2020-03-23: qty 1

## 2020-03-23 MED ORDER — MIRTAZAPINE 15 MG PO TABS
15.0000 mg | ORAL_TABLET | Freq: Every day | ORAL | Status: DC
  Administered 2020-03-23 – 2020-03-30 (×7): 15 mg via ORAL
  Filled 2020-03-23 (×9): qty 1

## 2020-03-23 MED ORDER — GABAPENTIN 100 MG PO CAPS
200.0000 mg | ORAL_CAPSULE | Freq: Two times a day (BID) | ORAL | Status: DC
  Administered 2020-03-24: 200 mg via ORAL
  Filled 2020-03-23 (×2): qty 2

## 2020-03-23 MED ORDER — PROPOFOL 500 MG/50ML IV EMUL: INTRAVENOUS | Status: DC | PRN

## 2020-03-23 MED ORDER — POVIDONE-IODINE 10 % EX SWAB: 2.0000 "application " | Freq: Once | CUTANEOUS | Status: DC

## 2020-03-23 MED ORDER — FERROUS SULFATE 325 (65 FE) MG PO TABS
325.0000 mg | ORAL_TABLET | Freq: Three times a day (TID) | ORAL | Status: DC
  Administered 2020-03-23 – 2020-03-31 (×22): 325 mg via ORAL
  Filled 2020-03-23 (×22): qty 1

## 2020-03-23 MED ORDER — COLCHICINE 0.3 MG HALF TABLET
0.3000 mg | ORAL_TABLET | Freq: Every day | ORAL | Status: DC
  Administered 2020-03-23 – 2020-03-31 (×9): 0.3 mg via ORAL
  Filled 2020-03-23 (×9): qty 1

## 2020-03-23 MED ORDER — SODIUM CHLORIDE 0.9 % IV SOLN: INTRAVENOUS | Status: DC | PRN

## 2020-03-23 SURGICAL SUPPLY — 42 items
ADH SKN CLS APL DERMABOND .7 (GAUZE/BANDAGES/DRESSINGS) ×1
ADH SKN CLS LQ APL DERMABOND (GAUZE/BANDAGES/DRESSINGS) ×1
BIT DRILL CANN LG 4.3MM (BIT) IMPLANT
COVER PERINEAL POST (MISCELLANEOUS) ×3 IMPLANT
COVER SURGICAL LIGHT HANDLE (MISCELLANEOUS) ×3 IMPLANT
COVER WAND RF STERILE (DRAPES) ×3 IMPLANT
DERMABOND ADHESIVE PROPEN (GAUZE/BANDAGES/DRESSINGS) ×2
DERMABOND ADVANCED (GAUZE/BANDAGES/DRESSINGS) ×2
DERMABOND ADVANCED .7 DNX12 (GAUZE/BANDAGES/DRESSINGS) ×1 IMPLANT
DERMABOND ADVANCED .7 DNX6 (GAUZE/BANDAGES/DRESSINGS) IMPLANT
DRAPE STERI IOBAN 125X83 (DRAPES) ×3 IMPLANT
DRILL BIT CANN LG 4.3MM (BIT) ×3
DRSG AQUACEL AG ADV 3.5X 6 (GAUZE/BANDAGES/DRESSINGS) ×2 IMPLANT
DRSG MEPILEX BORDER 4X12 (GAUZE/BANDAGES/DRESSINGS) ×6 IMPLANT
DURAPREP 26ML APPLICATOR (WOUND CARE) ×3 IMPLANT
ELECT REM PT RETURN 9FT ADLT (ELECTROSURGICAL) ×3
ELECTRODE REM PT RTRN 9FT ADLT (ELECTROSURGICAL) ×1 IMPLANT
FACESHIELD WRAPAROUND (MASK) IMPLANT
FACESHIELD WRAPAROUND OR TEAM (MASK) ×2 IMPLANT
GLOVE BIO SURGEON STRL SZ7.5 (GLOVE) ×3 IMPLANT
GLOVE INDICATOR 7.5 STRL GRN (GLOVE) ×3 IMPLANT
GOWN STRL REUS W/ TWL LRG LVL3 (GOWN DISPOSABLE) ×2 IMPLANT
GOWN STRL REUS W/TWL 2XL LVL3 (GOWN DISPOSABLE) ×3 IMPLANT
GOWN STRL REUS W/TWL LRG LVL3 (GOWN DISPOSABLE) ×6
GUIDEPIN VERSANAIL DSP 3.2X444 (ORTHOPEDIC DISPOSABLE SUPPLIES) ×2 IMPLANT
HIP FRAC NAIL LAG SCR 10.5X100 (Orthopedic Implant) ×3 IMPLANT
KIT TURNOVER KIT B (KITS) ×3 IMPLANT
MANIFOLD NEPTUNE II (INSTRUMENTS) ×1 IMPLANT
NAIL HIP FRACT 130D 11X180 (Screw) ×2 IMPLANT
NS IRRIG 1000ML POUR BTL (IV SOLUTION) ×3 IMPLANT
PACK GENERAL/GYN (CUSTOM PROCEDURE TRAY) ×3 IMPLANT
PAD ARMBOARD 7.5X6 YLW CONV (MISCELLANEOUS) ×6 IMPLANT
SCREW BONE CORTICAL 5.0X36 (Screw) ×2 IMPLANT
SCREW CANN THRD AFF 10.5X100 (Orthopedic Implant) IMPLANT
SUT MNCRL AB 4-0 PS2 18 (SUTURE) ×3 IMPLANT
SUT VIC AB 1 CT1 27 (SUTURE) ×3
SUT VIC AB 1 CT1 27XBRD ANBCTR (SUTURE) ×1 IMPLANT
SUT VIC AB 2-0 CT1 27 (SUTURE) ×3
SUT VIC AB 2-0 CT1 TAPERPNT 27 (SUTURE) ×1 IMPLANT
TOWEL GREEN STERILE (TOWEL DISPOSABLE) ×3 IMPLANT
TOWEL GREEN STERILE FF (TOWEL DISPOSABLE) ×3 IMPLANT
WATER STERILE IRR 1000ML POUR (IV SOLUTION) ×3 IMPLANT

## 2020-03-23 NOTE — Progress Notes (Signed)
FPTS Interim Progress Note  S: Received page about low BP of 105/44.  Went to see patient and currently resting comfortably.  Denies any lightheadedness or change in mentation.  Mom indicates she continues to look the same.  O: BP (!) 105/44   Pulse 66   Temp (!) 97.5 F (36.4 C) (Oral)   Resp 12   SpO2 94%     A/P: Hypotension - Will give 75 ml/hr NS for 6 hours  Delora Fuel, MD 03/23/2020, 3:24 AM PGY-1, [place] Medicine Service pager [phone]

## 2020-03-23 NOTE — Progress Notes (Signed)
Manufacturing engineer [facility]) Hospital Liaison Note Jordan [name] is a current hospice patient admitted 03/06/20 with a terminal diagnosis of hypertensive heart disease.  She lives with her son Jordan [name], and her daughter Jordan [name] cares for her during the day. At baseline she ambulates with a walker and stand by assist and wears supplemental O2 at HS and PRN daytime.  Patient fell on 03/22/20 and was transported to [facility] ED where she was found to have a right hip fracture.  Hospice was notified prior to transport. Patient was admitted to [facility] with hip fracture s/p fall.  Per Dr. Tomasa Hosteller this is a related admission.  Visited with son Jordan [name] in room and with daughter Jordan [name] [facility]) in waiting area. Pt was in surgery at time of visit.  [name] shared goal of decreased pain after surgery, reports pt was in great pain after fall.  Jordan [name] and [name] share that they do not want pt to go to rehab but would be open to PT in home.  Vital Signs: 97.1, 88/46, 0 HR, 15RR, 100% 2L I&O:  1115/50 Abnormal Labs: Na 134, Creat 1.21, Ca 7.0, GFR 41, HGB 7.3 (pre transfusion- no post trx CBC seen)) platelet 94  Diagnostics:   DG Chest Port 1 View   Result Date: 03/22/2020 CLINICAL DATA:  Hip deformity.  Preoperative chest x-ray. EXAM: PORTABLE CHEST 1 VIEW COMPARISON:  10/20/2019 FINDINGS: Cardiac pacemaker. Heart size and pulmonary vascularity are normal. Emphysematous changes in the lungs with scattered fibrosis and probable bronchiectasis. Nodular scarring in the right apex is similar to previous study. No pleural effusion or pneumothorax. No focal consolidation. Mediastinal contours appear intact. Calcified and tortuous aorta. Degenerative changes in the spine. Postoperative change in the right shoulder. IMPRESSION: Emphysematous changes and fibrosis in the lungs. No evidence of active pulmonary disease. Electronically Signed   By: [name] M.D.   On: 03/22/2020 22:13    DG Hip Unilat With Pelvis 2-3 Views  Right   Result Date: 03/22/2020 CLINICAL DATA:  Hip deformity EXAM: DG HIP (WITH OR WITHOUT PELVIS) 2-3V RIGHT COMPARISON:  Right femur 10/11/2017 FINDINGS: Acute comminuted fracture of the inter trochanteric right proximal femur. Varus angulation of the fracture fragments. Displaced lesser trochanteric fragments. Sclerosis in the fracture line corresponds to an area of pre-existing sclerosis, possibly old infarct or enchondroma. A pathologic fracture is not excluded. Diffuse bone demineralization. Degenerative changes in the hip joint. Vascular calcifications. IMPRESSION: Acute comminuted intertrochanteric fracture of the right proximal femur with varus angulation of the fracture fragments. Electronically Signed   By: [name] M.D.   On: 03/22/2020 22:12       IV/PRN Meds: NS @75mL /hr continuous, ancef 2g PIV x 1, fentanyl 25 mcg PIV PRN pain x 2, I unit blood . Problem List:  . R comminuted femur fracture s/p fall: Acute.  . L hip pain s/p fall: Acute.   . Macrocytic anemia: Acute on chronic.  Discharge Planning: home with possible PT support  Family Contact: Son Jordan [name] and daughter Jordan [name]  IDT: updated  Goals of Care:  family does not want rehab. Code status DNR  Should patient need ambulance transfer at discharge, please use [facility] as they contract this service for our active hospice patients.  Thank you for the opportunity to participate in this patient's care.     [name], BSN, RN [place] (listed on [place] under Hospice[facility])    [phone]

## 2020-03-23 NOTE — Plan of Care (Signed)
  Problem: Education: Goal: Knowledge of General Education information will improve Description: Including pain rating scale, medication(s)/side effects and non-pharmacologic comfort measures 03/23/2020 0413 by [name], RN Outcome: Progressing 03/23/2020 0413 by [name], RN Outcome: Progressing   Problem: Health Behavior/Discharge Planning: Goal: Ability to manage health-related needs will improve Outcome: Progressing

## 2020-03-23 NOTE — OR Nursing (Signed)
Pt is awake,alert and oriented.Pt and/or family verbalized understanding of poc and discharge instructions. Reviewed admission and on going care with receiving RN. Pt is in NAD at this time and is ready to be transferred to floor. Will con't to monitor until pt is transferred. Belongings on bed with patient Pt on 02 2 L/MIN [place]

## 2020-03-23 NOTE — Progress Notes (Addendum)
SOCIAL VISIT Jordan [name] is well known to me as she has been my patient for several years. I spoke with her daughter, Jordan [name], at bedside.   Jordan [name]'s frailty continues to progress to dependence in all her basic ADLs except eating and very limited mobility, limited strength, limited energy expenditure, and ongoing weight loss with decreased oral intake.  Her frailty, of course, will make her susceptible to complications from her injury, e.g. urinary retention, dvt, pressure injuries, delirium.    While I have not asked Jordan [name] specifically about disposition after hospitalization, I anticipate that It eventually will be home with hospice.

## 2020-03-23 NOTE — Anesthesia Preprocedure Evaluation (Addendum)
Anesthesia Evaluation  Patient identified by MRN, date of birth, ID band Patient awake    Reviewed: Allergy & Precautions, NPO status , Patient's Chart, lab work & pertinent test results  History of Anesthesia Complications Negative for: history of anesthetic complications  Airway Mallampati: I  TM Distance: >3 FB Neck ROM: Full    Dental  (+) Edentulous Upper, Edentulous Lower, Dental Advisory Given   Pulmonary neg pulmonary ROS,    Pulmonary exam normal        Cardiovascular hypertension, Pt. on medications + CAD and +CHF  Normal cardiovascular exam+ dysrhythmias + pacemaker    '21 TTE - EF 60 to 65%. Indeterminate diastolic filling due to E-A fusion.     Neuro/Psych  Headaches, PSYCHIATRIC DISORDERS Depression Dementia  Neuromuscular disease (peripheral neuropathy) CVA    GI/Hepatic Neg liver ROS, GERD  Medicated and Controlled,  Endo/Other  Hypothyroidism   Renal/GU Renal InsufficiencyRenal disease     Musculoskeletal  (+) Arthritis , Osteoarthritis,   PMR    Abdominal   Peds  Hematology  (+) anemia ,  Thrombocytopenia    Anesthesia Other Findings Covid test negative   Reproductive/Obstetrics                            Anesthesia Physical  Anesthesia Plan  ASA: III  Anesthesia Plan: Spinal   Post-op Pain Management:    Induction: Intravenous  PONV Risk Score and Plan: 2 and Propofol infusion and Treatment may vary due to age or medical condition  Airway Management Planned: Nasal Cannula and Natural Airway  Additional Equipment: None  Intra-op Plan:   Post-operative Plan:   Informed Consent: I have reviewed the patients History and Physical, chart, labs and discussed the procedure including the risks, benefits and alternatives for the proposed anesthesia with the patient or authorized representative who has indicated his/her understanding and acceptance.   Patient  has DNR.  Discussed DNR with power of attorney and Suspend DNR.   Dental advisory given and Consent reviewed with POA  Plan Discussed with: CRNA, Anesthesiologist and Surgeon  Anesthesia Plan Comments:        Anesthesia Quick Evaluation

## 2020-03-23 NOTE — Anesthesia Procedure Notes (Addendum)
Spinal  Patient location during procedure: OR Start time: 03/23/2020 3:39 PM End time: 03/23/2020 3:29 PM Staffing Performed: anesthesiologist  Anesthesiologist: Duane Boston, MD Preanesthetic Checklist Completed: patient identified, IV checked, risks and benefits discussed, surgical consent, monitors and equipment checked, pre-op evaluation and timeout performed Spinal Block Patient position: right lateral decubitus Prep: DuraPrep Patient monitoring: cardiac monitor, continuous pulse ox and blood pressure Approach: midline Location: L2-3 Injection technique: single-shot Needle Needle type: Pencan and Quincke  Needle gauge: 22 G Needle length: 9 cm Additional Notes Functioning IV was confirmed and monitors were applied. Sterile prep and drape, including hand hygiene and sterile gloves were used. The patient was positioned and the spine was prepped. The skin was anesthetized with lidocaine.  Free flow of clear CSF was obtained prior to injecting local anesthetic into the CSF.  The spinal needle aspirated freely following injection.  The needle was carefully withdrawn.  The patient tolerated the procedure well.

## 2020-03-23 NOTE — Progress Notes (Signed)
PHARMACY NOTE:  ANTIMICROBIAL RENAL DOSAGE ADJUSTMENT  Current antimicrobial regimen includes a mismatch between antimicrobial dosage and estimated renal function.  As per policy approved by the Pharmacy & Therapeutics and Medical Executive Committees, the antimicrobial dosage will be adjusted accordingly.  Current antimicrobial dosage:  Cefazolin 2 gm IV Q 6 hrs X 2 doses  Indication:  Post-operative surgical prophylaxis  Renal Function:  Estimated Creatinine Clearance: 13.7 mL/min (A) (by C-G formula based on SCr of 1.21 mg/dL (H)). []      On intermittent HD, scheduled: []      On CRRT    Antimicrobial dosage has been changed to:  Cefazolin 2 gm IV Q 12 hrs X 1 dose   Thank you for allowing pharmacy to be a part of this patient's care.  Gillermina Hu, PharmD, BCPS, [facility] Clinical Pharmacist 03/23/2020 7:03 PM

## 2020-03-23 NOTE — ED Notes (Signed)
Attempted to call report x 1  

## 2020-03-23 NOTE — Transfer of Care (Signed)
Immediate Anesthesia Transfer of Care Note  Patient: Jordan [name]  Procedure(s) Performed: INTRAMEDULLARY (IM) NAIL INTERTROCHANTRIC (Right )  Patient Location: PACU  Anesthesia Type:MAC and Spinal  Level of Consciousness: awake, alert  and oriented  Airway & Oxygen Therapy: Patient Spontanous Breathing and Patient connected to nasal cannula oxygen  Post-op Assessment: Report given to RN and Post -op Vital signs reviewed and stable  Post vital signs: Reviewed and stable  Last Vitals:  Vitals Value Taken Time  BP 87/41 03/23/20 1643  Temp    Pulse 60 03/23/20 1645  Resp 16 03/23/20 1645  SpO2 100 % 03/23/20 1645  Vitals shown include unvalidated device data.  Last Pain:  Vitals:   03/23/20 1340  TempSrc:   PainSc: 4       Patients Stated Pain Goal: 4 (63/84/66 5993)  Complications: No complications documented.

## 2020-03-23 NOTE — Consult Note (Signed)
Reason for Consult: right hip fracture Referring Physician:  Erin Hearing, MD  Jordan [name] is an 86 y.o. female.  HPI: Jordan [name] is a 86 y.o. female past medical history of complete heart block, Alzheimer's dementia, failure to thrive on hospice care, presenting to the ED with right hip pain after mechanical fall.  Patient was reportedly sitting on the edge of her bed and reach towards her nightstand when she slipped and fell to the floor.  She landed on her right side.  She did not hit her head.  She is complaining of pain to the right hip.  There is associated deformity and swelling reported.  Patient also reports some decrease sensation to the right leg, and feeling too weak to move her leg.  Recently family decided to transition her from palliative care to hospice care.  She is being cared for at home by family.  They states she can ambulate with a walker and assistance at baseline.  They also states she is unaware that she is on hospice care because they know it would frighten her  Past Medical History:  Diagnosis Date  . Acute gout of right knee 09/14/2018   Resolved. Continue allopurinol and cochicine prophylaxis  . Acute hip pain, left 06/29/2018  . Acute pancreatitis 04/05/2018  . AKI (acute kidney injury) ([place])   . Alzheimer's dementia [facility]) 07/30/2013   04/17/14 MoCA - Blind (Spanish version); 9 out of 22.  Correct MoCA = 12 out of 30.    . Arthritis   . Atherosclerosis of aorta ([place]) 10/05/2015  . Back pain 10/05/2015  . Bronchiectasis ([place]) 10/03/2017  . Calcification of native coronary artery 10/05/2015   Chest CT 2011.   . Carpal tunnel syndrome 06/29/2006   Qualifier: Diagnosis of  By: Benna Dunks    . Cervicalgia 05/18/2009   Qualifier: Diagnosis of  By: Walker Kehr MD, Patrick Jupiter    . CHF (congestive heart failure) ([place])   . Chronic bilateral thoracic back pain 10/05/2015  . Chronic constipation   . Chronic diastolic heart failure ([place]) 09/01/2013  . Chronic pain syndrome 05/09/2014  . CKD (chronic  kidney disease) stage 3, GFR 30-59 ml/min 09/03/2013  . Collapsed vertebra due to osteoporosis ([place]) 10/06/2015   Multiple thoracolumbar vertebral body collapses Chest CT 2018  . Combined visual hearing impairment 11/12/2013  . Complete heart block ([place])   . Complete immobility due to severe physical disability or frailty ([place]) 05/30/2014  . Constipation   . Cryptogenic stroke ([place]) S/P  LOOP RECORDER IMPLANT   DX  01/2013  --  SYNCOPAL EPISODE --  EPISODIC  ATRIAL TACHY/ FIBULATION  AND PAUSES  . CYSTOCELE/RECTOCELE/PROLAPSE,UNSPEC. 06/29/2006   Qualifier: Diagnosis of  By: Benna Dunks    . Depressive disorder 06/29/2006       . Dilation of esophagus 06/16/2017   Barium Esophagram 06/25/17: Poor esophageal motility. Dilated esophagus with tertiary contractions.  Smooth narrowing distal esophagus similar to the prior study.  Moderate narrowing. Barium tablet passed through this area after several sips of water.  Marland Kitchen Displaced fracture of fifth metatarsal bone, right foot, subsequent encounter for fracture with routine healing 06/05/2017  . Diverticulosis of colon   . DIVERTICULOSIS OF COLON 06/29/2006   Qualifier: Diagnosis of  By: Benna Dunks    . Dry eyes   . Dysphagia 09/15/2015  . Encounter for care of pacemaker   . Fall from bed 03/17/2016  . Fatigue   . Food impaction of esophagus   . Frequency  of urination   . Gait instability   . Generalized osteoarthritis of multiple sites 06/15/2017  . Glaucoma 04/18/2014  . Glaucoma of both eyes   . Gout 09/21/2013  . Hand Heaviness 03/17/2016  . Headache   . Hearing loss of aging 05/09/2014  . HERNIA, INCISIONAL VENTRAL W/O OBST/GNGR 10/25/2006   Has been seen by surgery in the past and told that this is not dangerous and should only be treated particularly bothersome    . History of acute gouty arthritis 09/21/2013  . History of colon polyps   . History of Food impaction of esophagus   . HTN (hypertension) 06/29/2006   Isolated Systolic Hypertension -  Ambulatory BP  Monitoring result 4/28-29/2015   . Hyperlipidemia 02/27/2015  . Hyperplastic colon polyp 2006   colonoscope  . Hypertension   . Hypochloremia   . Hypoproteinemia ([place]) 09/14/2018  . Hypothyroidism   . Impaired mobility and ADLs 05/30/2014  . Influenza A 05/14/2016  . Insomnia   . INTERSTITIAL CYSTITIS 09/21/2009   Qualifier: Diagnosis of  By: Sarita Haver  MD, Coralyn Mark    . Loss of appetite 09/14/2018  . Loss of weight 04/05/2010   Qualifier: Diagnosis of  By: Walker Kehr MD, Patrick Jupiter    . Lumbar stenosis   . Macular degeneration, age related, nonexudative 04/18/2014  . Mass of soft tissue of upper arm 10/18/2018  . MASS, LUNG 04/05/2010   Qualifier: Diagnosis of  By: Walker Kehr MD, Patrick Jupiter    . Mobitz type 2 second degree heart block 08/29/2013  . Moderate dementia ([place])   . Narrowing of Distal Esophagus 06/16/2017   Barium Esophagram (06/15/17) Poor esophageal motility. Dilated esophagus with tertiary contractions.  Smooth narrowing distal esophagus similar to the prior study.  Moderate narrowing. Barium tablet passed through this area after several sips of water.   . OA (osteoarthritis)    RIGHT SHOULDER AC JOINT  . Orthostasis 04/28/2016  . OSTEOARTHRITIS, MULTI SITES 06/29/2006   Qualifier: Diagnosis of  By: Benna Dunks    . Osteoporosis   . OSTEOPOROSIS 03/20/2008   Qualifier: Diagnosis of  By: Jerline Pain MD, Anderson Malta    . 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 Peripheral neuropathy   . PERIPHERAL NEUROPATHY 03/29/2010   Qualifier: Diagnosis of  By: Walker Kehr MD, Patrick Jupiter    . Persistent headaches 09/06/2013  . Polymyalgia rheumatica ([place]) 11/08/2013  . Presbyesophagus (senile esophageal dysmotility) 06/16/2017  . Presence of permanent cardiac pacemaker   . Pressure ulcer of right posterior thigh, unspecified pressure ulcer stage 08/26/2019  . Rib pain on left side 10/05/2015  . Right knee pain 01/15/2014  .  Right rotator cuff tear   . Rotator cuff tear arthropathy 09/11/2012   Likely related to rotator cuff tendinopathy seen on musculoskeletal ultrasound, and GH DJD.  Complete tear of right supra and infraspinatus on MRI 10/2012     . S/P shoulder replacement 03/20/2015  . Shortness of breath dyspnea   . Spinal stenosis of lumbar region 10/03/2012   Patient has significant low back pain related to spinal stenosis and DJD, DDD.  We had a long discussion about treatment options which are quite limited. She is likely not a good surgical candidate for laminectomy. I am not sure about the usefulness of epidural steroid injection. We'll plan on restarting meloxicam after a long discussion of the pros and cons and risks of chronic kidney disease.  The patient  and her family feel that the benefit of good pain control is the small risk.  Additionally she will followup with her primary care provider for creatinine monitoring.  Will obtain physical therapy for TENS unit trial.  Will refer to pain management for consideration of epidural steroid injection as that worked well in the past for cervical pain Followup here as needed   . Syncope and collapse 02/09/2013  . Urinary frequency 06/05/2015  . Urinary incontinence, mixed 06/29/2006   Qualifier: Diagnosis of  By: Benna Dunks    . UTI (urinary tract infection) 05/27/2016  . UTI (urinary tract infection) 05/2017  . Vertigo 10/18/2010  . Weakness 08/29/2013    Past Surgical History:  Procedure Laterality Date  . CARPAL TUNNEL RELEASE Right   . ESOPHAGOGASTRODUODENOSCOPY N/A 09/15/2015   Procedure: ESOPHAGOGASTRODUODENOSCOPY (EGD);  Surgeon: Clarene Essex, MD;  Location: [facility] ENDOSCOPY;  Service: Endoscopy;  Laterality: N/A;  . ESOPHAGOGASTRODUODENOSCOPY (EGD) WITH PROPOFOL N/A 10/21/2019   Procedure: ESOPHAGOGASTRODUODENOSCOPY (EGD) WITH PROPOFOL;  Surgeon: Wonda Horner, MD;  Location: [facility] ENDOSCOPY;  Service: Endoscopy;  Laterality: N/A;  . EYE SURGERY     cataract,  bilat.  Randolm Idol / REPLACE / REMOVE PACEMAKER    . LOOP RECORDER IMPLANT  02-08-13; 08-30-13   MDT LinQ implanted by Dr Lovena Le for syncope, explanted 08-30-13 after CHB identified  . LOOP RECORDER IMPLANT N/A 02/11/2013   Procedure: LOOP RECORDER IMPLANT;  Surgeon: Evans Lance, MD;  Location: [facility] CATH LAB;  Service: Cardiovascular;  Laterality: N/A;  . PACEMAKER INSERTION  08-30-2013   MDT ADDRL1 pacemaker implanted by Dr Rayann Heman for CHB  . PERMANENT PACEMAKER INSERTION N/A 08/30/2013   Procedure: PERMANENT PACEMAKER INSERTION;  Surgeon: Coralyn Mark, MD;  Location: [facility] CATH LAB;  Service: Cardiovascular;  Laterality: N/A;  . REVERSE SHOULDER ARTHROPLASTY Right 03/20/2015   Procedure: RIGHT REVERSE SHOULDER ARTHROPLASTY;  Surgeon: Netta Cedars, MD;  Location: [place];  Service: Orthopedics;  Laterality: Right;  . TOTAL ABDOMINAL HYSTERECTOMY W/ BILATERAL SALPINGOOPHORECTOMY  03-15-2005    Family History  Problem Relation Age of Onset  . Kidney disease Mother   . Heart attack Neg Hx   . Stroke Neg Hx     Social History:  reports that she has never smoked. She has never used smokeless tobacco. She reports that she does not drink alcohol and does not use drugs.  Allergies:  Allergies  Allergen Reactions  . Chlorthalidone Other (See Comments)    Hyponatremia  . Honey Bee Treatment [Bee Venom] Swelling and Rash  . Verapamil Other (See Comments)    Bradycardia - HR  35-45 on 40mg  TID  . Adhesive [Tape] Rash and Other (See Comments)    Skin irritation  . Amlodipine Other (See Comments)    Peripheral edema with 2.5mg  daily dose    Medications:  I have reviewed the patient's current medications. Scheduled: . acetaminophen (TYLENOL) oral liquid 160 mg/5 mL  325 mg Oral Q6H  . levothyroxine  100 mcg Oral QAC breakfast  . mirtazapine  15 mg Oral QHS    Results for orders placed or performed during the hospital encounter of 03/22/20 (from the past 24 hour(s))  Type and screen [place]     Status: None   Collection Time: 03/22/20  9:38 PM  Result Value Ref Range   ABO/RH(D) O POS    Antibody Screen NEG    Sample Expiration      03/25/2020,2359 Performed at [facility],  [address], [place], [place] [address]   Respiratory Panel by RT PCR (Flu A&B, Covid) - Nasopharyngeal Swab     Status: None   Collection Time: 03/22/20 11:51 PM   Specimen: Nasopharyngeal Swab; Nasopharyngeal(NP) swabs in vial transport medium  Result Value Ref Range   SARS Coronavirus 2 by RT PCR NEGATIVE NEGATIVE   Influenza A by PCR NEGATIVE NEGATIVE   Influenza B by PCR NEGATIVE NEGATIVE  Basic metabolic panel     Status: Abnormal   Collection Time: 03/23/20 12:19 AM  Result Value Ref Range   Sodium 129 (L) 135 - 145 mmol/L   Potassium 4.8 3.5 - 5.1 mmol/L   Chloride 94 (L) 98 - 111 mmol/L   CO2 30 22 - 32 mmol/L   Glucose, Bld 108 (H) 70 - 99 mg/dL   BUN 19 8 - 23 mg/dL   Creatinine, Ser 1.27 (H) 0.44 - 1.00 mg/dL   Calcium 8.1 (L) 8.9 - 10.3 mg/dL   GFR, Estimated 38 (L) >60 mL/min   Anion gap 5 5 - 15  CBC with Differential/Platelet     Status: Abnormal   Collection Time: 03/23/20 12:19 AM  Result Value Ref Range   WBC 5.2 4.0 - 10.5 K/uL   RBC 2.12 (L) 3.87 - 5.11 MIL/uL   Hemoglobin 7.3 (L) 12.0 - 15.0 g/dL   HCT 22.0 (L) 36 - 46 %   MCV 103.8 (H) 80.0 - 100.0 fL   MCH 34.4 (H) 26.0 - 34.0 pg   MCHC 33.2 30.0 - 36.0 g/dL   RDW 17.5 (H) 11.5 - 15.5 %   Platelets 94 (L) 150 - 400 K/uL   nRBC 0.0 0.0 - 0.2 %   Neutrophils Relative % 86 %   Neutro Abs 4.5 1.7 - 7.7 K/uL   Lymphocytes Relative 8 %   Lymphs Abs 0.4 (L) 0.7 - 4.0 K/uL   Monocytes Relative 5 %   Monocytes Absolute 0.2 0.1 - 1.0 K/uL   Eosinophils Relative 0 %   Eosinophils Absolute 0.0 0.0 - 0.5 K/uL   Basophils Relative 0 %   Basophils Absolute 0.0 0.0 - 0.1 K/uL   Immature Granulocytes 1 %   Abs Immature Granulocytes 0.03 0.00 - 0.07 K/uL   *Note: Due to a large number of results  and/or encounters for the requested time period, some results have not been displayed. A complete set of results can be found in Results Review.     X-ray: CLINICAL DATA:  Hip deformity  EXAM: DG HIP (WITH OR WITHOUT PELVIS) 2-3V RIGHT  COMPARISON:  Right femur 10/11/2017  FINDINGS: Acute comminuted fracture of the inter trochanteric right proximal femur. Varus angulation of the fracture fragments. Displaced lesser trochanteric fragments. Sclerosis in the fracture line corresponds to an area of pre-existing sclerosis, possibly old infarct or enchondroma. A pathologic fracture is not excluded. Diffuse bone demineralization. Degenerative changes in the hip joint. Vascular calcifications.  IMPRESSION: Acute comminuted intertrochanteric fracture of the right proximal femur with varus angulation of the fracture fragments.   Electronically Signed   By: [name] M.D.  ROS : Review of Systems  Musculoskeletal: Positive for arthralgias and joint swelling.  Neurological: Positive for numbness plus history of dementia All other systems reviewed and are negative.  Blood pressure (!) 127/45, pulse 73, temperature (!) 97.5 F (36.4 C), temperature source Oral, resp. rate 15, SpO2 100 %.  Physical Exam: Physical Exam Vitals and nursing note reviewed.  Constitutional:  Appearance: She is well-developed.     Comments: Frail, chronically ill-appearing  HENT:     Head: Normocephalic and atraumatic.  Eyes:     Conjunctiva/sclera: Conjunctivae normal.  Cardiovascular:     Rate and Rhythm: Normal rate and regular rhythm.  Pulmonary:     Effort: Pulmonary effort is normal.  Abdominal:     Palpations: Abdomen is soft.  Musculoskeletal:     Comments: Right leg appears shortened and externally rotated.  There is swelling and deformity to the left hip/thigh  Skin:    General: Skin is warm.  Neurological:     Mental Status: She is alert.  Psychiatric:         Behavior: Behavior normal  Assessment/Plan: 1. Right hip intertrochanteric fracture  Plan: NPO After discussing the condition of her right hip fracture with her daughter she would like to proceed with ORIF of her right hip for pain control/paliative measures Post op plan reviewed  Mauri Pole 03/23/2020, 8:51 AM

## 2020-03-23 NOTE — Anesthesia Postprocedure Evaluation (Signed)
Anesthesia Post Note  Patient: Jordan [name]  Procedure(s) Performed: INTRAMEDULLARY (IM) NAIL INTERTROCHANTRIC (Right )     Patient location during evaluation: PACU Anesthesia Type: Spinal Level of consciousness: awake (at baseline) Pain management: pain level controlled Vital Signs Assessment: post-procedure vital signs reviewed and stable Respiratory status: spontaneous breathing and respiratory function stable Cardiovascular status: blood pressure returned to baseline and stable Postop Assessment: spinal receding and no apparent nausea or vomiting Anesthetic complications: no   No complications documented.  Last Vitals:  Vitals:   03/23/20 1721 03/23/20 1745  BP: (!) 99/50 (!) 102/51  Pulse: 60 60  Resp: 10 14  Temp:  (!) 36.3 C  SpO2: 99% 97%    Last Pain:  Vitals:   03/23/20 1745  TempSrc: Oral  PainSc:                  Audry Pili

## 2020-03-23 NOTE — Plan of Care (Signed)

## 2020-03-23 NOTE — Brief Op Note (Signed)
03/22/2020 - 03/23/2020  3:25 PM  PATIENT:  Jordan [name]  86 y.o. female  PRE-OPERATIVE DIAGNOSIS:  Closed comminuted right intertrochanteric hip fracture  POST-OPERATIVE DIAGNOSIS:  Closed comminuted right intertrochanteric hip fracture  PROCEDURE:  Procedure(s): INTRAMEDULLARY (IM) NAIL INTERTROCHANTRIC (Right)  SURGEON:  Surgeon(s) and Role:    Paralee Cancel, MD - Primary  PHYSICIAN ASSISTANT: None  ANESTHESIA:   spinal  EBL:  <100cc   BLOOD ADMINISTERED: 2 units of PRBCs   DRAINS: none   LOCAL MEDICATIONS USED:  NONE  SPECIMEN:  No Specimen  DISPOSITION OF SPECIMEN:  N/A  COUNTS:  YES  TOURNIQUET:  * No tourniquets in log *  DICTATION: .Other Dictation: Dictation Number [id number]  PLAN OF CARE: Admit to inpatient   PATIENT DISPOSITION:  PACU - hemodynamically stable.   Delay start of Pharmacological VTE agent (>24hrs) due to surgical blood loss or risk of bleeding: no

## 2020-03-23 NOTE — Progress Notes (Signed)
Manufacturing engineer [facility]) Hospital Liaison Note:  Jordan [name] is under hospice services with Manufacturing engineer. Spoke to [name] to support after patient fall in the home and admission to inpatient on 03/22/20 for right hip fracture. Daughter has decided, after discussions with the [facility] Orthopedic Team, to pursue surgery to alleviate patient pain and increase future mobility.  Per  [facility] Physician, Dr. Tomasa Hosteller, this is a hospice related admission and hospice will support Palliative surgery to increase patient comfort and quality of life. Family and Dr. Erin Hearing are aware of the above.  Please call when any hospice related questions or concerns as needed,  Gar Ponto, RN [place] (on [place]) [phone]

## 2020-03-24 ENCOUNTER — Encounter (HOSPITAL_COMMUNITY): Payer: Self-pay | Admitting: Orthopedic Surgery

## 2020-03-24 ENCOUNTER — Other Ambulatory Visit: Payer: Self-pay | Admitting: Family Medicine

## 2020-03-24 ENCOUNTER — Other Ambulatory Visit: Payer: Self-pay | Admitting: Cardiology

## 2020-03-24 DIAGNOSIS — S72141A Displaced intertrochanteric fracture of right femur, initial encounter for closed fracture: Secondary | ICD-10-CM | POA: Diagnosis not present

## 2020-03-24 DIAGNOSIS — M1A0210 Idiopathic chronic gout, right elbow, without tophus (tophi): Secondary | ICD-10-CM

## 2020-03-24 LAB — CBC
HCT: 20.9 % — ABNORMAL LOW (ref 36.0–46.0)
Hemoglobin: 6.8 g/dL — CL (ref 12.0–15.0)
MCH: 32.1 pg (ref 26.0–34.0)
MCHC: 32.5 g/dL (ref 30.0–36.0)
MCV: 98.6 fL (ref 80.0–100.0)
Platelets: 39 10*3/uL — ABNORMAL LOW (ref 150–400)
RBC: 2.12 MIL/uL — ABNORMAL LOW (ref 3.87–5.11)
RDW: 18.3 % — ABNORMAL HIGH (ref 11.5–15.5)
WBC: 5.5 10*3/uL (ref 4.0–10.5)
nRBC: 0 % (ref 0.0–0.2)

## 2020-03-24 LAB — CBC WITH DIFFERENTIAL/PLATELET
Abs Immature Granulocytes: 0.01 10*3/uL (ref 0.00–0.07)
Basophils Absolute: 0 10*3/uL (ref 0.0–0.1)
Basophils Relative: 0 %
Eosinophils Absolute: 0 10*3/uL (ref 0.0–0.5)
Eosinophils Relative: 0 %
HCT: 32.5 % — ABNORMAL LOW (ref 36.0–46.0)
Hemoglobin: 10.9 g/dL — ABNORMAL LOW (ref 12.0–15.0)
Immature Granulocytes: 0 %
Lymphocytes Relative: 25 %
Lymphs Abs: 1.4 10*3/uL (ref 0.7–4.0)
MCH: 32 pg (ref 26.0–34.0)
MCHC: 33.5 g/dL (ref 30.0–36.0)
MCV: 95.3 fL (ref 80.0–100.0)
Monocytes Absolute: 0.7 10*3/uL (ref 0.1–1.0)
Monocytes Relative: 13 %
Neutro Abs: 3.4 10*3/uL (ref 1.7–7.7)
Neutrophils Relative %: 62 %
Platelets: 33 10*3/uL — ABNORMAL LOW (ref 150–400)
RBC: 3.41 MIL/uL — ABNORMAL LOW (ref 3.87–5.11)
RDW: 16.4 % — ABNORMAL HIGH (ref 11.5–15.5)
WBC: 5.6 10*3/uL (ref 4.0–10.5)
nRBC: 0 % (ref 0.0–0.2)

## 2020-03-24 LAB — BASIC METABOLIC PANEL
Anion gap: 8 (ref 5–15)
BUN: 25 mg/dL — ABNORMAL HIGH (ref 8–23)
CO2: 26 mmol/L (ref 22–32)
Calcium: 7.5 mg/dL — ABNORMAL LOW (ref 8.9–10.3)
Chloride: 96 mmol/L — ABNORMAL LOW (ref 98–111)
Creatinine, Ser: 1.45 mg/dL — ABNORMAL HIGH (ref 0.44–1.00)
GFR, Estimated: 33 mL/min — ABNORMAL LOW (ref >60)
Glucose, Bld: 105 mg/dL — ABNORMAL HIGH (ref 70–99)
Potassium: 5.2 mmol/L — ABNORMAL HIGH (ref 3.5–5.1)
Sodium: 130 mmol/L — ABNORMAL LOW (ref 135–145)

## 2020-03-24 LAB — PREPARE RBC (CROSSMATCH)

## 2020-03-24 MED ORDER — ACETAMINOPHEN 160 MG/5ML PO SOLN
325.0000 mg | ORAL | Status: DC
  Administered 2020-03-25 – 2020-03-31 (×29): 325 mg via ORAL
  Filled 2020-03-24 (×33): qty 20.3

## 2020-03-24 MED ORDER — METHOCARBAMOL 500 MG PO TABS: 500.0000 mg | ORAL_TABLET | Freq: Four times a day (QID) | ORAL | 0 refills | Status: DC | PRN

## 2020-03-24 MED ORDER — HYDROCODONE-ACETAMINOPHEN 5-325 MG PO TABS
1.0000 | ORAL_TABLET | ORAL | 0 refills | Status: DC | PRN
Start: 2020-03-24 — End: 2020-03-31

## 2020-03-24 MED ORDER — SODIUM CHLORIDE 0.9% IV SOLUTION: Freq: Once | INTRAVENOUS | Status: DC

## 2020-03-24 MED ORDER — HYDROCODONE-ACETAMINOPHEN 5-325 MG PO TABS
1.0000 | ORAL_TABLET | ORAL | 0 refills | Status: DC | PRN
Start: 2020-03-24 — End: 2020-03-24

## 2020-03-24 MED ORDER — FESOTERODINE FUMARATE ER 4 MG PO TB24
4.0000 mg | ORAL_TABLET | Freq: Every day | ORAL | Status: DC
  Administered 2020-03-25 – 2020-03-30 (×6): 4 mg via ORAL
  Filled 2020-03-24 (×8): qty 1

## 2020-03-24 MED ORDER — GABAPENTIN 100 MG PO CAPS: 100.0000 mg | ORAL_CAPSULE | Freq: Every day | ORAL | Status: DC

## 2020-03-24 MED ORDER — HYDROCODONE-ACETAMINOPHEN 7.5-325 MG/15ML PO SOLN: 10.0000 mL | ORAL | Status: DC | PRN

## 2020-03-24 MED ORDER — MORPHINE SULFATE ER 15 MG PO TBCR
15.0000 mg | EXTENDED_RELEASE_TABLET | Freq: Two times a day (BID) | ORAL | Status: DC
  Administered 2020-03-24: 15 mg via ORAL
  Filled 2020-03-24 (×2): qty 1

## 2020-03-24 MED ORDER — GABAPENTIN 100 MG PO CAPS
100.0000 mg | ORAL_CAPSULE | Freq: Three times a day (TID) | ORAL | Status: DC
  Administered 2020-03-25 – 2020-03-27 (×2): 100 mg via ORAL
  Filled 2020-03-24 (×8): qty 1

## 2020-03-24 NOTE — Progress Notes (Signed)
MD on call paged that daughter has concerns about patient confusion and requesting a UA.

## 2020-03-24 NOTE — Op Note (Signed)
NAME: Jordan [name], Jordan [name] MEDICAL RECORD IR:[id number] ACCOUNT [id number] DATE OF BIRTH:[birth year] FACILITY: [facility] LOCATION: [place], [place]  OPERATIVE REPORT  DATE OF PROCEDURE:  03/23/2020  PREOPERATIVE DIAGNOSIS:  Comminuted right intertrochanteric hip fracture.  POSTOPERATIVE DIAGNOSIS:  Comminuted right intertrochanteric hip fracture.  PROCEDURE:  Open reduction internal fixation of the right intertrochanteric fracture with an intramedullary nail.  COMPONENTS USED:  A Biomet Affixus nail 11 x 180 mm with a 130-degree lag screw measured at 100 mm with a distal interlock.  SURGEON:  Paralee Cancel, MD  ASSISTANT:  Surgical team.  ANESTHESIA:  Spinal plus MAC anesthesia.  BLOOD LOSS:  Less than 100 mL.  BLOOD ADMINISTERED:  She did receive 1-2 units of blood during the procedure due to a hemoglobin of 7 preoperatively.  This was not related to instability or labile pressures or blood loss, this was preoperative plan.  DRAINS:  None.  COMPLICATIONS:  None.  INDICATIONS:  The patient is a 86 year old female who is living with family with history of dementia and is ambulatory at home with a walker.  She unfortunately had a ground level fall that was unwitnessed.  She had a lot of pain in her right hip and was  subsequently brought to the emergency room where radiographs revealed a right intertrochanteric femur fracture.  She was admitted to the hospitalist service per protocol and orthopedics was consulted for management.  She had been placed on hospice by  her family on 11/05.  This was related to age and dementia.  I had a long discussion with her daughter who is her power of attorney and makes decisions for her regarding the process.  After reviewing the options of nonoperative versus operative  treatment, she felt that the best thing for her mom was to proceed with surgical treatment to stabilize the fracture to help with pain control.  She did touch base with the  hospice case management team to make them aware that she was hospitalized and  that they had decided to have her hip fixed.  Standard risk of infection and DVT were reviewed.  Standard risks of malunion, nonunion, need for future surgeries were reviewed.  We reviewed the postoperative course and expectations.  Consent was obtained  for benefit of pain relief.  DESCRIPTION OF PROCEDURE:  The patient was brought to the operative theater.  Once adequate anesthesia was established, preoperative antibiotics, Ancef administered.  She was positioned supine on the Hana table set up for fracture repair.  With all of  her bony prominences carefully padded and her position against the perineal post, applied some slight traction and internal rotation to lower extremity.  Fluoroscopic imaging confirmed near anatomic reduction of the proximal right femur.  This was  confirmed in both AP and lateral planes.  Fluoroscopy was removed from the field.  The right hip was then prepped and draped with a shower curtain technique.  A timeout was performed, identifying the patient, the planned procedure and extremity.   Fluoroscopy was brought back to the field.  The tip of the trochanter was identified under fluoroscopic imaging.  I then made an incision lateral to the proximal trochanter.  Guidewire was then placed into the tip of the trochanter and passed across the  fracture site, confirmed radiographically,  I then opened up the proximal femur with a starting drill.  We then passed the 11 x 180 mm nail by hand to its appropriate depth.  Once it was in position, I used a triple  guide to place a guidewire into the  center of the femoral head in AP and lateral planes.  I measured and selected a 100 mm screw.  I drilled for the lag screw.  The lag screw was then placed and sat in fairly decent subchondral bone within the femoral head.  I then took traction off the  lower extremity and used the compression wheel to medialize the  shaft to the fracture itself.  I then chose to lock this down as a fixed angle device to prevent compression, given the fact that she will not be compliant with any weightbearing  restrictions.  Once this was done, the distal interlock screw was placed in routine fashion through the insertion jig.  The jig was then removed.  Final radiographs were obtained in the AP and lateral planes.  I then irrigated all wounds.  The proximal  wound was closed in layers using #1 Vicryl and on the gluteal fascia followed by 2-0 Vicryl and a running Monocryl stitch.  The 2 distal incisions were closed with 2-0 Vicryl.  All wounds were then cleaned, dried and dressed sterilely using surgical glue  and Aquacel dressing.  She was then taken from the operative table to the PACU in stable condition.  Findings were reviewed with her daughter.  Postoperatively, will be allowed to be weightbearing as tolerated due to the inability to have reliable  compliance.  We will work on bed to chair transfers initially and limit walking and we will follow her in the office every couple of weeks to follow for healing.  HN/NUANCE  D:03/23/2020 T:03/24/2020 JOB:[id number]

## 2020-03-24 NOTE — Progress Notes (Signed)
Family Medicine Teaching Service Daily Progress Note Intern Pager: [phone]  [name] Medical record number: [id number] Date of birth: [birth year] Age: 86 y.o. Gender: female  Primary Care Provider: McDiarmid, Blane Ohara, MD Consultants: Orthopedics Code Status: DNR  Pt Overview and Major Events to Date:   11/22: admitted, underwent ORIF w/intramedullary nail, transfused 1-2u pRBCS   Assessment and Plan:  Jordan [name] is a 86 y.o. female who presented with hip pain after fall, found to have a right hip fracture. PMH is significant for Alzheimer's dementia, bronchiectasis, gout, CKDIV, HFpEF, HTN, andAfib/complete heart block withpacemaker not currently on anticoagulation  Acute Right Comminuted Femur Fracture s/p ORIF 11/22 Patient underwent palliative ORIF w/intramedullary nail yesterday. Pain is not well controlled this morning- rated 8/10. Has only received morphine at 3am and scheduled Tylenol. -Orthopedic surgery following, appreciate recommendations -Continue Robaxin 500mg  q6h prn -MS Contin 15mg  BID -Hydrocodone-acetaminophen solution q4h prn -PT/OT eval and treat  Anemia  Thrombocytopenia Hgb 6.8, Plt 39 this morning. S/p 1-2u pRBCs yesterday. -Will transfuse additional 2u pRBCs -f/u post transfusion CBC -d/c ASA (was previously ordered for DVT ppx)  Hospice Care Patient currently under [facility] hospice services. -Palliative treatments only  Bronchiectasis: chronic On 2L home O2 as needed. -Continue supplemental O2  A-fib/complete heart block s/p pacemaker insertion Pacemaker in place. Not on anticoagulation due to hospice. -Continue to monitor  HFpEF Last echo June 2021 showed EF 60-65% and was otherwise normal. On Lasix 20mg  daily prn for edema. -Holding home Lasix  CKD Stage IIIb Cr 1.45 this am (from 1.21 yesterday). Baseline ~1.2. -Avoid nephrotoxic meds  Hypothyroidism: chronic, stable On Synthroid 192mcg at home. -Continue home  synthroid  Gout: chronic, stable On colchicine 0.6mg  BID and allopurinol 100mg  daily at home. -Continue home allopurinol -Colchicine at 0.3mg  daily due to renal function  Chronic back pain On 50mg  tramadol q6h prn and gabapentin 200mg  BID at home. -Holding tramadol in the setting of post-op pain control w/opioids -Will decrease Gabapentin to 300mg  total daily dose due to renal function  Depression: chronic, stable On Mirtazapine 50mg  daily and trazodone 50mg  daily at home. -Continue home mirtazapine -Holding home trazodone   FEN/GI: soft diet PPx: SCDs   Status is: Inpatient Remains inpatient appropriate because:Inpatient level of care appropriate due to severity of illness  Dispo: The patient is from: Home              Anticipated d/c is to: Home              Anticipated d/c date is: 2 days              Patient currently is not medically stable to d/c.    Subjective:  Patient reports ongoing pain in her R hip (8/10). Otherwise has no complaints.  Objective: Temp:  [97.1 F (36.2 C)-97.8 F (36.6 C)] 97.8 F (36.6 C) (11/23 0414) Pulse Rate:  [58-73] 64 (11/23 0414) Resp:  [10-18] 18 (11/23 0414) BP: (87-121)/(39-59) 99/59 (11/23 0414) SpO2:  [94 %-100 %] 100 % (11/23 0414) Weight:  [43.1 kg] 43.1 kg (11/22 1830) Physical Exam: General: alert, frail elderly woman, NAD Cardiovascular: RRR, normal S1/S2 without m/r/g Respiratory: normal WOB, lungs CTAB Abdomen: +BS, soft, nontender, nondistended Extremities: no peripheral edema  Laboratory: Recent Labs  Lab 03/23/20 0019 03/24/20 0138  WBC 5.2 5.5  HGB 7.3* 6.8*  HCT 22.0* 20.9*  PLT 94* 39*   Recent Labs  Lab 03/23/20 0019 03/23/20 1211 03/24/20 0138  NA  129* 134* 130*  K 4.8 4.5 5.2*  CL 94* 102 96*  CO2 30 26 26   BUN 19 20 25*  CREATININE 1.27* 1.21* 1.45*  CALCIUM 8.1* 7.0* 7.5*  GLUCOSE 108* 79 105*    Imaging/Diagnostic Tests: DG C-Arm 1-60 Min Result Date: 03/23/2020 FINDINGS: Four  C-arm fluoroscopic images were obtained intraoperatively and submitted for post operative interpretation. These images demonstrate fixation of a right intertrochanteric fracture with intramedullary nail and lag screw. Please see the performing provider's procedural report for further detail.   DG HIP OPERATIVE UNILAT W OR W/O PELVIS RIGHT Result Date: 03/23/2020 FINDINGS: Four C-arm fluoroscopic images were obtained intraoperatively and submitted for post operative interpretation. These images demonstrate fixation of a right intertrochanteric fracture with intramedullary nail and lag screw. Please see the performing provider's procedural report for further detail.    Alcus Dad, MD 03/24/2020, 6:08 AM PGY-1, [place] Intern pager: [phone], text pages welcome

## 2020-03-24 NOTE — Progress Notes (Signed)
Manufacturing engineer [facility]) Hospital Liaison Note  Jordan [name] is a current hospice patient admitted 03/06/20 with a terminal diagnosis of hypertensive heart disease.  She lives with her son Jordan [name], and her daughter Jordan [name] cares for her during the day. At baseline she ambulates with a walker and stand by assist and wears supplemental O2 at HS and PRN daytime.  Patient fell on 03/22/20 and was transported to [facility] ED where she was found to have a right hip fracture.  Hospice was notified prior to transport. Patient was admitted to [facility] with hip fracture s/p fall.  Per Dr. Tomasa Hosteller this is a related admission.   Patient is stable, NAD.  Rates pain as mild. Son has been at bedside. She has not really had much to eat today. Daughter has requested a hospital bed for when she goes home.   Vital Signs: 97.5, 105/44, 69, 17, 100% 2LNC I&O:  1603/1253 Abnormal Labs: 03/24/2020 01:38 Sodium: 130 (L) Potassium: 5.2 (H) Chloride: 96 (L) Glucose: 105 (H) BUN: 25 (H) Creatinine: 1.45 (H) Calcium: 7.5 (L) GFR, Estimated: 33 (L) RBC: 2.12 (L) Hemoglobin: 6.8 (LL) HCT: 20.9 (L) RDW: 18.3 (H) Platelets: 39 (L)  IV/PRN meds: morphine 1mg  IV PRN @ 0323; Hycet 7.5-325mg  PO PRN @ 1201  Problem list: Acute Right Comminuted Femur Fracture s/p ORIF 11/22 Anemia  Thrombocytopenia Bronchiectasis: chronic A-fib/complete heart block s/p pacemaker insertion CKD Stage IIIb Hypothyroidism: chronic, stable Gout: chronic, stable Chronic back pain Depression: chronic, stable  Discharge Planning: home with possible PT support  Family Contact: Spoke with daughter Jordan [name].   IDT: updated  Goals of Care:  family does not want rehab. Code status DNR  Should patient need ambulance transfer at discharge, please use [facility] as they contract this service for our active hospice patients.  Farrel Gordon, RN, [facility] (listed on AMION under [place])   [phone]

## 2020-03-24 NOTE — Progress Notes (Signed)
     Subjective: 1 Day Post-Op Procedure(s) (LRB): INTRAMEDULLARY (IM) NAIL INTERTROCHANTRIC (Right)   Patient reports pain as mild, pain controlled.  No reported events throughout the night.  Discussed the procedure, findings and expectations moving forward.  Discharged dispo TBD.  Follow up in the clinic in 2 weeks.     Objective:   VITALS:   Vitals:   03/24/20 1112 03/24/20 1145  BP: (!) 104/35 (!) 118/45  Pulse: 70 75  Resp: 18 16  Temp: (!) 97.5 F (36.4 C) 97.6 F (36.4 C)  SpO2: 100% 100%    Dorsiflexion/Plantar flexion intact Incision: scant drainage No cellulitis present Compartment soft  LABS Recent Labs    03/23/20 0019 03/24/20 0138  HGB 7.3* 6.8*  HCT 22.0* 20.9*  WBC 5.2 5.5  PLT 94* 39*    Recent Labs    03/23/20 0019 03/23/20 1211 03/24/20 0138  NA 129* 134* 130*  K 4.8 4.5 5.2*  BUN 19 20 25*  CREATININE 1.27* 1.21* 1.45*  GLUCOSE 108* 79 105*     Assessment/Plan: 1 Day Post-Op Procedure(s) (LRB): INTRAMEDULLARY (IM) NAIL INTERTROCHANTRIC (Right)  Advance diet Up with therapy Discharge disposition TBD   Ortho recommendations:  Eliquis for anticoagulation, on pre-op.  Norco for pain management (Rx written), unless otherwise indicated.  MiraLax and Colace for constipation  Iron 325 mg tid for 2-3 weeks, if tolerated  PWB 50% on the right leg.  Dressing to remain in place until follow in clinic in 2 weeks.  Dressing is waterproof and may shower with it in place.  Follow up in 2 weeks at [facility] [place]. Follow up with OLIN,Hamzeh Tall D in 2 weeks.  Contact information:  [facility] [address], [address] [place] [phone]             Danae Orleans PA-C  [facility]  [place] [address], [place], [place],  [address] Phone: [phone] [email or web address] Facebook  [employer]

## 2020-03-24 NOTE — Care Management (Signed)
Patient active with [facility] ( see note). Patient is GIP. NCM confirmed with Audrea Muscat with [facility] they will coordinate discharge plan.    Magdalen Spatz RN

## 2020-03-24 NOTE — Hospital Course (Addendum)
  Jordan [name] is a 86 y.o. female who presented with hip pain after fall, found to have a right hip fracture. PMH is significant for Alzheimer's dementia, bronchiectasis, gout, CKD IV, HFpEF, HTN, and Afib/complete heart block with pacemaker not currently on anticoagulation.   Acute Right Comminuted Femur Fracture s/p ORIF 11/22 Patient presented with R hip pain after fall out of bed. X-ray showed acute comminuted intertrochanteric fracture of the right proximal femur with varus angulation of the fracture fragments. She underwent ORIF with intramedullary nailing on 11/22 and tolerated the procedure well. Patient's post-op pain was adequately controlled with Tylenol and Tramadol. Orthopedic surgery followed the patient and PT and OT worked with her until she reached her goal of being able to pivot to bedside commode.  Anemia  Thrombocytopenia Patient's hemoglobin 7.3 on admission with platelets 94. She was transfused 1-2u pRBCs intraoperatively. Despite this, her hemoglobin was 6.8 the following morning. She received an additional 2u pRBCs on 11/22 which brought her hemoglobin to 10.9. Hemoglobin stable at 9.0 on the day of discharge. Her platelets remained low, but stable at 91 on day of discharge.   Hospice Care Patient currently under [facility] hospice services and this was a hospice-related admission. The Hospice Liaison spoke with the patient and her family daily.  The remainder of patient's chronic medical problems remained stable and she was maintained on her home medications.  DC recommendations: Decreased colchicine for kidney function Decreased Gabapentin for kidney function Discharge with foley catheter?

## 2020-03-24 NOTE — Progress Notes (Signed)
FPTS Interim Progress Note  S:Patient's daughter reporting new confusion.  Daughter indicates symptoms started after getting Morphine earlier this afternoon and have been waxing and waning.  Patient currently responding to questions appropriately  But indicates she has leg pain and medications making her feel funny.  Wants to avoid taking any more tonight, but agreeable to tasking Tylenol for pain.  Daughter also expresses concern for UTI given past UTI in past that resulted in patient getting Sepsis in hospital.  O: BP (!) 164/57 (BP Location: Left Arm)   Pulse 83   Temp 98.6 F (37 C) (Oral)   Resp 17   Ht 4\' 4"  (1.321 m)   Wt 43.1 kg   SpO2 100%   BMI 24.70 kg/m    Physical Exam Constitutional:      General: She is not in acute distress.    Appearance: She is not ill-appearing.  HENT:     Head: Normocephalic and atraumatic.     Mouth/Throat:     Mouth: Mucous membranes are moist.  Cardiovascular:     Rate and Rhythm: Normal rate and regular rhythm.     Pulses: Normal pulses.  Pulmonary:     Effort: Pulmonary effort is normal.     Breath sounds: Normal breath sounds.  Neurological:     General: No focal deficit present.     Mental Status: She is alert and oriented to person, place, and time.     Comments: Could not remember name of hospital but aware in hospital and able to remember reason for being here     A/P:  AMS Likely due to delerium from combination of several factors incluiding long acting morphine, age, dementia, recent surgery and hospital stay.  Think may be less likely to UTI but daughter asking if we can still get UA.  Patient also indicated wants to avoid taking medications tonight.  Talked with patient and daughter and agreeable to taking Tylenol and following up on symptoms and UA in morning.  Spoke with nurse and told plan for tonight. - Will hold medications except fro Tylenol 325 q4hr until morning, unless patient has significant increase in pain -  Obtain UA  Delora Fuel, MD 03/24/2020, 11:16 PM PGY-1, [place] Medicine Service pager [phone]

## 2020-03-24 NOTE — Progress Notes (Signed)
Received page from RN that morning hemoglobin is 6.8 from 7.3 the day prior (also additionally note platelets 39, previously 94).   She underwent ORIF of her right hip yesterday, 11/22, and received either 1 or 2 PRBCs perioperatively.  EBL under 100 mL.  RN reports that she is otherwise doing well sleeping with pain well managed and no worsening of swelling in area.  Will wait until later in the morning to talk with her daughter, healthcare POA, to discuss additional blood transfusion.  Patriciaann Clan, DO

## 2020-03-24 NOTE — Progress Notes (Signed)
PT Cancellation Note  Patient Details Name: Jordan [name] MRN: [id number] DOB: Sep 17, 1919   Cancelled Treatment:    Reason Eval/Treat Not Completed: Medical issues which prohibited therapy Patient's Hgb 6.8 this AM. Per RN, patient is to receive 2 units of blood. PT will re-attempt evaluation pending updated lab values.   Perrin Maltese, PT, DPT Acute Rehabilitation Services Pager [phone] Office [phone]    Alda Lea 03/24/2020, 10:24 AM

## 2020-03-25 DIAGNOSIS — W19XXXS Unspecified fall, sequela: Secondary | ICD-10-CM | POA: Diagnosis not present

## 2020-03-25 DIAGNOSIS — S72141A Displaced intertrochanteric fracture of right femur, initial encounter for closed fracture: Secondary | ICD-10-CM | POA: Diagnosis not present

## 2020-03-25 LAB — URINALYSIS, COMPLETE (UACMP) WITH MICROSCOPIC
Bilirubin Urine: NEGATIVE
Glucose, UA: NEGATIVE mg/dL
Ketones, ur: NEGATIVE mg/dL
Leukocytes,Ua: NEGATIVE
Nitrite: NEGATIVE
Protein, ur: NEGATIVE mg/dL
Specific Gravity, Urine: 1.012 (ref 1.005–1.030)
pH: 5 (ref 5.0–8.0)

## 2020-03-25 LAB — CBC WITH DIFFERENTIAL/PLATELET
Abs Immature Granulocytes: 0.02 10*3/uL (ref 0.00–0.07)
Basophils Absolute: 0 10*3/uL (ref 0.0–0.1)
Basophils Relative: 0 %
Eosinophils Absolute: 0 10*3/uL (ref 0.0–0.5)
Eosinophils Relative: 1 %
HCT: 32.3 % — ABNORMAL LOW (ref 36.0–46.0)
Hemoglobin: 10.7 g/dL — ABNORMAL LOW (ref 12.0–15.0)
Immature Granulocytes: 0 %
Lymphocytes Relative: 22 %
Lymphs Abs: 1.3 10*3/uL (ref 0.7–4.0)
MCH: 31.5 pg (ref 26.0–34.0)
MCHC: 33.1 g/dL (ref 30.0–36.0)
MCV: 95 fL (ref 80.0–100.0)
Monocytes Absolute: 0.6 10*3/uL (ref 0.1–1.0)
Monocytes Relative: 11 %
Neutro Abs: 3.8 10*3/uL (ref 1.7–7.7)
Neutrophils Relative %: 66 %
Platelets: 36 10*3/uL — ABNORMAL LOW (ref 150–400)
RBC: 3.4 MIL/uL — ABNORMAL LOW (ref 3.87–5.11)
RDW: 16.7 % — ABNORMAL HIGH (ref 11.5–15.5)
WBC: 5.7 10*3/uL (ref 4.0–10.5)
nRBC: 0 % (ref 0.0–0.2)

## 2020-03-25 LAB — BPAM RBC
Blood Product Expiration Date: 202112252359
Blood Product Expiration Date: 202112252359
Blood Product Expiration Date: 202112252359
ISSUE DATE / TIME: 202111221502
ISSUE DATE / TIME: 202111231115
ISSUE DATE / TIME: 202111231529
Unit Type and Rh: 5100
Unit Type and Rh: 5100
Unit Type and Rh: 5100

## 2020-03-25 LAB — TYPE AND SCREEN
ABO/RH(D): O POS
Antibody Screen: NEGATIVE
Unit division: 0
Unit division: 0
Unit division: 0

## 2020-03-25 MED ORDER — CHLORHEXIDINE GLUCONATE CLOTH 2 % EX PADS
6.0000 | MEDICATED_PAD | Freq: Every day | CUTANEOUS | Status: AC
Start: 2020-03-25 — End: 2020-03-30
  Administered 2020-03-26 – 2020-03-29 (×4): 6 via TOPICAL

## 2020-03-25 MED ORDER — MUPIROCIN 2 % EX OINT
1.0000 "application " | TOPICAL_OINTMENT | Freq: Two times a day (BID) | CUTANEOUS | Status: AC
  Administered 2020-03-25 – 2020-03-29 (×9): 1 via NASAL
  Filled 2020-03-25 (×5): qty 22

## 2020-03-25 MED ORDER — TRAMADOL HCL 50 MG PO TABS
50.0000 mg | ORAL_TABLET | Freq: Four times a day (QID) | ORAL | Status: DC | PRN
  Administered 2020-03-25 – 2020-03-29 (×2): 50 mg via ORAL
  Filled 2020-03-25 (×2): qty 1

## 2020-03-25 NOTE — Care Management Important Message (Signed)
Important Message  Patient Details  Name: Jordan [name] MRN: [id number] Date of Birth: [birth year]   Medicare Important Message Given:  Yes   Due to staffing patient room was called no answer IM documents mailed to the patient home address   Orbie Pyo 03/25/2020, 4:14 PM

## 2020-03-25 NOTE — Progress Notes (Addendum)
Family Medicine Teaching Service Daily Progress Note Intern Pager: [phone]  [name] Medical record number: [id number] Date of birth: 1919-09-21 Age: 86 y.o. Gender: female  Primary Care Provider: McDiarmid, Blane Ohara, MD Consultants: Orthopedics Code Status: DNR  Pt Overview and Major Events to Date:   11/22: admitted, underwent ORIF w/intramedullary nail, transfused 1-2pRBCs 11/22: transfused additional 2u pRBCs  Assessment and Plan:  Jordan [name] a 86 y.o.femalewho presented with hip pain after fall,found to have a right hip fracture. PMH is significant forAlzheimer's dementia, bronchiectasis,gout,CKDIV, HFpEF, HTN, andAfib/complete heart block withpacemakernot currently on anticoagulation.  Acute Right Comminuted Femur Fracture s/p ORIF 11/22 Patient still with significant pain this morning. Did not tolerate MS Contin due to delirium. Daughter inquiring about Tramadol which she takes at home. -Orthopedic surgery following, appreciate recommendations -Tylenol solution 325mg  q4h scheduled -Tramadol 50mg  q6h prn -Hydrocodone-acetaminophen solution q4h prn -Robaxin 500mg  q6h prn -PT/OT eval and treat  Anemia- resolved  Thrombocytopenia Hgb 10.9 after receiving additional 2u pRBCs yesterday. Platelets 33. Daughter would like to repeat CBC because patient would receive palliative transfusions if necessary. -Repeat CBC today -Consider CBC every other day going forward -Palliative/hospice to clarify goals of care  Hospice Care Patient currently under [facility] hospice services.  -Consulted palliative care. They will reach out to hospice and get involved in goals of care discussion as necessary.  Bronchiectasis: chronic On 2L home O2 as needed. -Continue supplemental O2  A-fib/complete heart block s/p pacemaker insertion Pacemaker in place. Not on anticoagulation due to hospice. -Continue to monitor  HFpEF Last echo June 2021 showed EF 60-65%  and was otherwise normal. On Lasix 20mg  daily prn for edema. -Holding home Lasix  CKD Stage IIIb Cr 1.45 yesterday morning. Baseline ~1.2. -Avoid nephrotoxic meds  Hypothyroidism: chronic, stable On Synthroid 12mcg at home. -Continue home synthroid  Gout: chronic, stable On colchicine 0.6mg  BID and allopurinol 100mg  daily at home. -Continue home allopurinol -Colchicine at 0.3mg  daily due to renal function  Chronic back pain On 50mg  tramadol q6h prn and gabapentin 200mg  BID at home. -Tramadol 50mg  q6h prn -Will decrease Gabapentin to 300mg  total daily dose due to renal function  Depression: chronic, stable On Mirtazapine 50mg  daily and trazodone 50mg  daily at home. -Continue home mirtazapine -Holding home trazodone   FEN/GI: soft diet PPx: SCDs   Status is: Inpatient Remains inpatient appropriate because:Inpatient level of care appropriate due to severity of illness   Dispo: The patient is from: Home              Anticipated d/c is to: Home              Anticipated d/c date is: 1 day              Patient currently is not medically stable to d/c.   Subjective:  Overnight patient was more confused than baseline. Patient and her daughter think it was related to the MS Contin. This morning she still has significant right hip pain, especially with any movement.   Objective: Temp:  [97.4 F (36.3 C)-98.6 F (37 C)] 97.8 F (36.6 C) (11/24 0415) Pulse Rate:  [68-83] 68 (11/24 0415) Resp:  [16-18] 16 (11/24 0415) BP: (104-164)/(35-57) 107/49 (11/24 0415) SpO2:  [97 %-100 %] 97 % (11/24 0415) Physical Exam: General: alert, elderly appearing, NAD Cardiovascular: RRR, normal S1/S2 without m/r/g Respiratory: normal WOB, lungs CTA anteriorly Abdomen: soft, nontender, nondistended Extremities: no lower extremity edema. Dressing in place on R lateral hip.  Laboratory: Recent Labs  Lab 03/23/20 0019 03/24/20 0138 03/24/20 2024  WBC 5.2 5.5 5.6  HGB 7.3* 6.8*  10.9*  HCT 22.0* 20.9* 32.5*  PLT 94* 39* 33*   Recent Labs  Lab 03/23/20 0019 03/23/20 1211 03/24/20 0138  NA 129* 134* 130*  K 4.8 4.5 5.2*  CL 94* 102 96*  CO2 30 26 26   BUN 19 20 25*  CREATININE 1.27* 1.21* 1.45*  CALCIUM 8.1* 7.0* 7.5*  GLUCOSE 108* 79 105*    Urinalysis  03/25/2020 00:16  Appearance CLEAR  Bilirubin Urine NEGATIVE  Color, Urine YELLOW  Glucose, UA NEGATIVE  Hgb urine dipstick SMALL (A)  Ketones, ur NEGATIVE  Leukocytes,Ua NEGATIVE  Nitrite NEGATIVE  pH 5.0  Protein NEGATIVE  Specific Gravity, Urine 1.012  Bacteria, UA RARE (A)  Hyaline Casts, UA PRESENT  Mucus PRESENT  RBC / HPF 0-5  WBC, UA 0-5    Imaging/Diagnostic Tests: No new imaging/diagnostic tests   Alcus Dad, MD 03/25/2020, 7:20 AM PGY-1, [place] Intern pager: [phone], text pages welcome

## 2020-03-25 NOTE — Progress Notes (Signed)
Manufacturing engineer [facility]) [place] Patient  Jordan [name] is a current hospice patient admitted 03/06/20 with a terminal diagnosis of hypertensive heart disease. She lives with her son Jordan [name], and her daughter Jordan [name] cares for her during the day. At baseline she ambulates with a walker and stand by assist and wears supplemental O2 at HS and PRN daytime. Patient fell on 03/22/20 and was transported to [facility] ED where she was found to have a right hip fracture. Hospice was notified prior to transport. Patient was admitted to [facility] with hip fracture s/p fall. Per Dr. Tomasa Hosteller this is a related admission.   Pt underwent IM nailing on 11/22, has not been mobilized yet. Met with daughter and pt at the bedside. Pt is asleep and daughter requests that we not wake her, that she had a bad night and was delirious secondary to morphine use. [name] is clear with her goals for her mother. She wants her to return home and be able to pivot from the bed > BSC > wheelchair. She understands her mother likely will not ambulate with a walker again and is more concerned with safe transferring techniques. She is adamant that she not go to a facility for rehab.  She would consider home health/PT at home, but she also states that "it really would not do her any good".   V/S: 97.8 oral, 107/49, HR 68, RR 97% on RA I&O: 1060/200 Labs: UA with small amounts of blood, +casts IVs/PRNs: tramadol 50 mg PO x 1, received a dose of morphine 2 mg IV on 11/23 that resulted in increased delirium  Problem List: - Right hip fracture - IM nailing on 11/22, waiting for PT/OT to evaluate to help determine safety with pivot transferring - Hypertensive heart disease - currently under hospice services for this, daughter is aware that her mother is getting weaker and understands the trajectory of a hip fracture   GOC: Clear. No aggressive treatments, treat what can be treated and optimize her medically so she can safely return  home. D/C Planning: Ongoing. Family does not want SNF. They want to take her home but need to know how to safely transfer her.  Hospice has a PT that can assess in the home 1-2 times as well after PT in hospital makes recommendations. Family would consider home health/PT but would not prefer this option either. Family will need a hospital bed ordered prior to d/c, unclear if this has been ordered or not.  Will determine and order if not.  Family: Updated at the bedside. IDT: Hospice team updated.  Once pt is ready to d/c home, please use [facility] for transport as they contract this service for our active hospice pts.  Venia Carbon RN, BSN, [facility] Liaison (epic chat or Shea Evans)

## 2020-03-25 NOTE — Plan of Care (Signed)

## 2020-03-25 NOTE — Progress Notes (Signed)
Palliative Medicine RN Note: Consult order noted. Patient is GIP with [facility]  Discussed with Venia Carbon. She met with the family for an hour this morning and confirmed GOC. She does not feel that our services are needed. Family is clear that it is important culturally to take Mrs [place] home and care for her there, and they understand that she is nearing EOL. They do not want to pursue any facility placement.  I called Dr Rock Nephew back to let her know. She asked if Anderson Malta had addressed labs/blood in her discussion; I do not know those specifics, but I did confirm that this is a hospice GIP/covered admission per hospice documentation.  [facility] liaison contact information is in [place]. PMT will not see this pt at this time.  Marjie Skiff Tyquavious Gamel, RN, BSN, [facility] Palliative Medicine Team 03/25/2020 12:13 PM Office [phone]

## 2020-03-25 NOTE — Care Management Important Message (Signed)
Important Message  Patient Details  Name: Jordan [name] MRN: [id number] Date of Birth: [birth year]   Medicare Important Message Given:  Yes     Jordan [name] 03/25/2020, 4:16 PM

## 2020-03-25 NOTE — Evaluation (Signed)
Physical Therapy Evaluation Patient Details Name: Jordan [name] MRN: [id number] DOB: 16-Aug-1919 Today's Date: 03/25/2020   History of Present Illness  Jordan [name] is a 86 y.o. female presenting with hip pain after fall, found to have a right hip fracture. PMH is significant for Alzheimer's dementia, bronchiectasis, Gout, CKD IV, HFpEF, HTN, and Afib/complete heart block with pacemaker not currently on Anticoagulation. Patient s/p IM nail R hip on 11/23.   Clinical Impression  PTA, patient required assistance for ADLs and mobility with rollator. Patient presents with increased pain, decreased activity tolerance, impaired balance, generalized weakness, and impaired functional mobility. Patient required modA+2 for supine>sit and maxA+2 for sit>supine. Patient performed sit to stand x2 with maxA+2 and RW. Instructed patient and daughter on exercises to perform in the bed. Daughter insisted patient be seen tomorrow (11/25- Thanksgiving) and when educated patient about staffing and holiday, daughter strongly insisted patient to be seen. Patient will benefit from skilled PT services to address listed deficits. Recommend HHPT and 24/7 supervision and assistance following discharge to maximize functional mobility as family refuses SNF.     Follow Up Recommendations Home health PT;Supervision/Assistance - 24 hour (family refuses SNF)    Equipment Recommendations  Rolling Jennavie Martinek with 5" wheels;Hospital bed    Recommendations for Other Services       Precautions / Restrictions Precautions Precautions: Fall Restrictions Weight Bearing Restrictions: Yes RLE Weight Bearing: Weight bearing as tolerated      Mobility  Bed Mobility Overal bed mobility: Needs Assistance Bed Mobility: Supine to Sit;Sit to Supine     Supine to sit: Mod assist;+2 for physical assistance;+2 for safety/equipment;HOB elevated Sit to supine: Max assist;+2 for physical assistance;+2 for safety/equipment;HOB elevated   General  bed mobility comments: Patient required assist for R LE advancement and cues for sequencing    Transfers Overall transfer level: Needs assistance Equipment used: Rolling Nelissa Bolduc (2 wheeled) Transfers: Sit to/from Stand Sit to Stand: Max assist;+2 physical assistance;+2 safety/equipment         General transfer comment: Cues for hand placement and sequencing, blocking of B feet due to sliding   Ambulation/Gait                Stairs            Wheelchair Mobility    Modified Rankin (Stroke Patients Only)       Balance Overall balance assessment: Needs assistance Sitting-balance support: Bilateral upper extremity supported;Feet supported Sitting balance-Leahy Scale: Poor   Postural control: Posterior lean Standing balance support: Bilateral upper extremity supported;During functional activity Standing balance-Leahy Scale: Poor                               Pertinent Vitals/Pain Pain Assessment: Faces Faces Pain Scale: Hurts even more Pain Location: R hip Pain Descriptors / Indicators: Grimacing;Guarding Pain Intervention(s): Monitored during session    Home Living Family/patient expects to be discharged to:: Private residence Living Arrangements: Children Available Help at Discharge: Family;Available 24 hours/day Type of Home: House Home Access: Stairs to enter   [employer] of Steps: 1 Home Layout: One level Home Equipment: Colbie Sliker - 4 wheels;Bedside commode;Shower seat;Wheelchair - manual;Hand held shower head      Prior Function Level of Independence: Needs assistance   Gait / Transfers Assistance Needed: uses rollator for ambulation with assistgance from family  ADL's / Homemaking Assistance Needed: family assistgs with all ADLs  Comments: Daughter is very involved in care.  Patient lives with son but daughter is there most of the time     Hand Dominance        Extremity/Trunk Assessment   Upper Extremity  Assessment Upper Extremity Assessment: Generalized weakness    Lower Extremity Assessment Lower Extremity Assessment: Generalized weakness;RLE deficits/detail RLE Deficits / Details: unable to perform SLR or initiate quad contraction RLE: Unable to fully assess due to pain RLE Sensation: WNL       Communication   Communication: Prefers language other than English (Daughter interpreted for PT)  Cognition Arousal/Alertness: Awake/alert Behavior During Therapy: WFL for tasks assessed/performed Overall Cognitive Status: History of cognitive impairments - at baseline                                        General Comments General comments (skin integrity, edema, etc.): Daughter and son present, insisted patient be seen tomorrow (Thanksgiving). Explained we are unable to see every patient on holidays due to short staffing    Exercises General Exercises - Lower Extremity Ankle Circles/Pumps: Both;10 reps Quad Sets: Both;5 reps Heel Slides: Both;5 reps Hip ABduction/ADduction: Both;5 reps Straight Leg Raises: Both;5 reps (unable to achieve on R)   Assessment/Plan    PT Assessment Patient needs continued PT services  PT Problem List Decreased strength;Decreased range of motion;Decreased activity tolerance;Decreased mobility;Decreased balance;Pain       PT Treatment Interventions DME instruction;Functional mobility training;Therapeutic activities;Therapeutic exercise;Balance training;Gait training;Patient/family education;Wheelchair mobility training    PT Goals (Current goals can be found in the Care Plan section)  Acute Rehab PT Goals Patient Stated Goal: to go home PT Goal Formulation: With patient/family Time For Goal Achievement: 04/08/20 Potential to Achieve Goals: Fair    Frequency Min 3X/week   Barriers to discharge        Co-evaluation               AM-PAC PT "6 Clicks" Mobility  Outcome Measure Help needed turning from your back to your  side while in a flat bed without using bedrails?: A Lot Help needed moving from lying on your back to sitting on the side of a flat bed without using bedrails?: A Lot Help needed moving to and from a bed to a chair (including a wheelchair)?: A Lot Help needed standing up from a chair using your arms (e.g., wheelchair or bedside chair)?: A Lot Help needed to walk in hospital room?: Total Help needed climbing 3-5 steps with a railing? : Total 6 Click Score: 10    End of Session Equipment Utilized During Treatment: Gait belt Activity Tolerance: Patient limited by pain Patient left: in bed;with call bell/phone within reach;with family/visitor present Nurse Communication: Mobility status PT Visit Diagnosis: Unsteadiness on feet (R26.81);Muscle weakness (generalized) (M62.81);History of falling (Z91.81);Difficulty in walking, not elsewhere classified (R26.2)    Time: 1950-9326 PT Time Calculation (min) (ACUTE ONLY): 61 min   Charges:   PT Evaluation $PT Eval Moderate Complexity: 1 Mod PT Treatments $Therapeutic Exercise: 8-22 mins $Therapeutic Activity: 23-37 mins        Perrin Maltese, PT, DPT Acute Rehabilitation Services Pager [phone] Office [phone]   Melene Plan Allred 03/25/2020, 4:42 PM

## 2020-03-26 DIAGNOSIS — W19XXXS Unspecified fall, sequela: Secondary | ICD-10-CM | POA: Diagnosis not present

## 2020-03-26 DIAGNOSIS — S72141A Displaced intertrochanteric fracture of right femur, initial encounter for closed fracture: Secondary | ICD-10-CM | POA: Diagnosis not present

## 2020-03-26 LAB — BASIC METABOLIC PANEL
Anion gap: 6 (ref 5–15)
BUN: 29 mg/dL — ABNORMAL HIGH (ref 8–23)
CO2: 26 mmol/L (ref 22–32)
Calcium: 7.6 mg/dL — ABNORMAL LOW (ref 8.9–10.3)
Chloride: 99 mmol/L (ref 98–111)
Creatinine, Ser: 1.62 mg/dL — ABNORMAL HIGH (ref 0.44–1.00)
GFR, Estimated: 29 mL/min — ABNORMAL LOW (ref >60)
Glucose, Bld: 80 mg/dL (ref 70–99)
Potassium: 4.9 mmol/L (ref 3.5–5.1)
Sodium: 131 mmol/L — ABNORMAL LOW (ref 135–145)

## 2020-03-26 NOTE — Progress Notes (Signed)
Manufacturing engineer [facility]) Jordan [name]  Jordan [name] is a current hospice [name] admitted 03/06/20 with a terminal diagnosis of hypertensive heart disease. She lives with her son Jordan [name], and her daughter Jordan [name] cares for her during the day. At baseline she ambulates with a walker and stand by assist and wears supplemental O2 at HS and PRN daytime. [name] fell on 03/22/20 and was transported to [facility] ED where she was found to have a right hip fracture. Hospice was notified prior to transport. [name] was admitted to [facility] with hip fracture s/p fall. Per Dr. Tomasa Hosteller this is a related admission.  This morning [name] is alert and oriented, not fully back to her baseline per her daughter. She is sitting in the recliner eating breakfast. She does not endorse any pain that is not managed by current medication regimen. She was able to mobilize with PT but required 2+ assistance.  Lengthy discussion with her daughter about different options. Currently, she is leaning towards HHPT as opposed to SNF. Discussed that hospice has a PT, but they are able to provide 1-2 visits and the focus is on safe transferring.   V/S: 97.7 oral, 124/45, HR 67, RR 18, SPO2 95 % on RA I&O: 60/750 Labs: Na 131, Cr 1.62, BUN 29 IVs/PRNs: ultram 50 mg PO x 1  Problem List: - right hip fracture - IM nailing on 11/22, tylenol scheduled for pain, PT mobilized x 2 with little assistance from the [name]. She currently cannot return home with this level of mobility.   GOC: clear. Palliative in nature only. Daughter is having a hard time deciding on if she wants to pursue HHPT or continue with hospice services. Family has advised they would need to stop hospice services in order to pursue HHPT. D/C planning: return home, unclear if she will return with hospice or will choose home health PT. IDT: hospice team updated Family: updated in room   [place], BSN, [facility] Liaison

## 2020-03-26 NOTE — Progress Notes (Signed)
FPTS Interim Progress Note  Went to go see paitnet as daughter had concerns about wound dressing.  She expressed concerns about edge of dressing coming off and bandage leaking blood.  Concern due mother having some leakage of urine and worry about infection.  No excessive bleeding or bruising.  Edge of bandage is coming off.  Ortho instructions were to leave bandaging on for 2 weeks.     - Will have nurse reinforce dressing tonight - Will contact ortho in morning about bandage   Delora Fuel, MD 03/26/2020, 12:15 AM PGY-1, [place] [facility] Medicine Service pager [phone]

## 2020-03-26 NOTE — Plan of Care (Signed)

## 2020-03-26 NOTE — Progress Notes (Signed)
Patient's daughter expressed concerns about patient's right hip wound.  Noted dressing with old bloody drainage noted.  Bloody drainage also leaking from wound site.  Wound site is redden.  Daughter would like for medical provider to look at wound tonight.  I have paged attending provider([facility] and made aware.  [facility] provider will come and see patient and daughter.   12:15 am [facility] Provider at bedside to assess wound and speak to patient's daughter.  Wound reinforced.  Provider to contact Ortho in the am to inform about bandage.

## 2020-03-26 NOTE — Progress Notes (Signed)
Jordan [name]  MRN: [id number] DOB/Age: May 16, 1919 86 y.o. Physician: Ander Slade, M.D. 3 Days Post-Op Procedure(s) (LRB): INTRAMEDULLARY (IM) NAIL INTERTROCHANTRIC (Right)  Subjective: Patient asleep in bed this morning with daughter at bedside.  Arousable to baseline dementia.  Complains of generalized right hip pain during mobilization for hip evaluation. Vital Signs Temp:  [97.7 F (36.5 C)-98.3 F (36.8 C)] 97.7 F (36.5 C) (11/25 0755) Pulse Rate:  [67-75] 67 (11/25 0755) Resp:  [14-18] 18 (11/25 0755) BP: (107-139)/(42-58) 124/45 (11/25 0755) SpO2:  [95 %-98 %] 95 % (11/25 0755)  Lab Results Recent Labs    03/24/20 2024 03/25/20 1533  WBC 5.6 5.7  HGB 10.9* 10.7*  HCT 32.5* 32.3*  PLT 33* 36*   BMET Recent Labs    03/24/20 0138 03/26/20 0714  NA 130* 131*  K 5.2* 4.9  CL 96* 99  CO2 26 26  GLUCOSE 105* 80  BUN 25* 29*  CREATININE 1.45* 1.62*  CALCIUM 7.5* 7.6*   INR  Date Value Ref Range Status  10/19/2019 1.2 0.8 - 1.2 Final    Comment:    (NOTE) INR goal varies based on device and disease states. Performed at [facility], [place] [address], [place], [place] [address]      Exam  Inspection of the right hip girdle demonstrates the initial postop dressings have created some superficial skin slough and there are broad areas of ecchymosis surrounding the region of the greater trochanter.  Initial postop Aquacel dressings are removed.  The skin incision itself remains well approximated.  No active drainage.  There is superficial skin slough of the epidermis open area approximately 2 x 4 cm along the proximal and anterior aspect of the hip incision and broaden area of ecchymosis surrounding the incision.  It is unclear whether the region of ecchymosis is related to the initial fall versus irritation from the postop dressing versus trauma from the surgical procedure.  There does not appear to be fluctuance or active drainage through the incision.   Distal incisions are healing well with scant bloody drainage.  A Mepilex dressing is applied over the entirety of the region.  Otherwise compartments are soft.  No evidence to suggest acute postoperative infection based on today's clinical exam.  Impression:  Status post ORIF with intramedullary nail right hip fracture.  Today's exam demonstrates some broad resolving ecchymosis about the hip region with some areas of sloughing of the epidermis anteriorly over a limited area which may be related to the adhesive from the original dressings.  Alternatively this may represent some delayed tissue damage from the initial traumatic injury versus postsurgical change.  I do not see any changes to suggest that there is any type of acute infectious process.  Plan I have spoke with Jordan [name] daughter regarding today's findings.  I have reassured her that overall the incisions remain well approximated.  We will switch to the use of a Mepilex dressing.  Reinforce/change as needed.  Otherwise continue with weightbearing as tolerated for transfers.   Althia Egolf M Chrystina Naff 03/26/2020, 11:43 AM   Contact # [phone]

## 2020-03-26 NOTE — Progress Notes (Addendum)
Family Medicine Teaching Service Daily Progress Note Intern Pager: [phone]  [name] Medical record number: [id number] Date of birth: 04-Dec-1919 Age: 86 y.o. Gender: female  Primary Care Provider: McDiarmid, Blane Ohara, MD Consultants: Orthopedics Code Status: DNR  Pt Overview and Major Events to Date:  11/22: admitted, underwent ORIF w/intramedullary nail, transfused 1-2u pRBCs 11/23: transfused additional 2u pRBCs  Assessment and Plan:  Sheily Lineman Diazis a 86 y.o.femalewhopresentedwith hip pain after fall,found to have a right hip fracture. PMH is significant forAlzheimer's dementia, bronchiectasis,gout,CKDIV, HFpEF, HTN, andAfib/complete heart block withpacemakernot currently onanticoagulation.  Acute Right Comminuted Femur Fracture s/p ORIF 11/22 Patient's pain is well controlled this morning. Daughter is concerned about bleeding and possible infection around her surgical site (see image in media tab). Her goal for discharge is for patient to be able to pivot to bedside commode. -Will reach out to ortho to see patient today -Tylenol solution 325mg  q4h scheduled -Tramadol 50mg  q6h prn -Hydrocodone-acetaminophen solution q4h prn -Robaxin 500mg  q6h prn -PT/OT eval and treat  Anemia- resolved  Thrombocytopenia Hgb stable at 10.7. Platelets 36. She does have some oozing blood from her hip dressing, see media tab for image. Not on anticoagulation (had been d/c'd prior to admission due to hospice status) -Monitor bleeding -Daily CBC -Management of wound per ortho  Hospice Care Patient currently under [facility] following -Will pursue palliative treatments only per daughter/POA.  Bronchiectasis: chronic On 2L home O2 as needed. -Continue supplemental O2  A-fib/complete heart block s/p pacemaker insertion Pacemaker in place. Not on anticoagulation due to hospice. -Continue to monitor  HFpEF Last echo June 2021 showed EF  60-65% and was otherwise normal. On Lasix 20mg  daily prn for edema. -Holding home Lasix  CKD Stage IIIb Cr 1.62 this morning. Baseline ~1.2. -Avoid nephrotoxic meds  Hypothyroidism: chronic, stable On Synthroid 143mcg at home. -Continue home synthroid  Gout: chronic, stable On colchicine 0.6mg  BID and allopurinol 100mg  daily at home. -Continue home allopurinol -Colchicine at 0.3mg  daily due to renal function  Chronic back pain On 50mg  tramadol q6h prn and gabapentin 200mg  BID at home. -Tramadol 50mg  q6h prn -Will decrease Gabapentin to 300mg  total daily dose due to renal function  Depression: chronic, stable On Mirtazapine 50mg  daily and trazodone 50mg  daily at home. -Continue home mirtazapine -Holding home trazodone   FEN/GI: soft diet PPx: SCDs   Status is: Inpatient Remains inpatient appropriate because:Unsafe d/c plan   Dispo: The patient is from: Home              Anticipated d/c is to: Home              Anticipated d/c date is: 2 days              Patient currently is not medically stable to d/c.    Subjective:  Patient's pain is well controlled this morning. She denies other complaints. Per daughter patient slept very well last night.  Objective: Temp:  [97.7 F (36.5 C)-98.3 F (36.8 C)] 97.7 F (36.5 C) (11/25 0536) Pulse Rate:  [67-75] 67 (11/25 0536) Resp:  [14-17] 14 (11/25 0536) BP: (107-139)/(42-58) 124/58 (11/25 0536) SpO2:  [97 %-98 %] 97 % (11/25 0536) Physical Exam: General: alert, NAD Cardiovascular: RRR, normal S1/S2 without m/r/g Respiratory: normal WOB, lungs CTA anteriorly Abdomen: +BS, soft, nontender, nondistended Extremities: no peripheral edema. See media tab for image of R hip incision site  Laboratory: Recent Labs  Lab 03/24/20 0138 03/24/20 2024 03/25/20  1533  WBC 5.5 5.6 5.7  HGB 6.8* 10.9* 10.7*  HCT 20.9* 32.5* 32.3*  PLT 39* 33* 36*   Recent Labs  Lab 03/23/20 0019 03/23/20 1211 03/24/20 0138  NA 129*  134* 130*  K 4.8 4.5 5.2*  CL 94* 102 96*  CO2 30 26 26   BUN 19 20 25*  CREATININE 1.27* 1.21* 1.45*  CALCIUM 8.1* 7.0* 7.5*  GLUCOSE 108* 79 105*    Imaging/Diagnostic Tests: No new imaging/diagnostic tests   Alcus Dad, MD 03/26/2020, 7:19 AM PGY-1, [place] Intern pager: [phone], text pages welcome

## 2020-03-26 NOTE — Evaluation (Signed)
Occupational Therapy Evaluation Patient Details Name: Jordan [name] MRN: [id number] DOB: [birth year] Today's Date: 03/26/2020    History of Present Illness Jordan [name] is a 86 y.o. female presenting with hip pain after fall, found to have a right hip fracture. PMH is significant for Alzheimer's dementia, bronchiectasis, Gout, CKD IV, HFpEF, HTN, and Afib/complete heart block with pacemaker not currently on Anticoagulation. Patient s/p IM nail R hip on 11/23.    Clinical Impression   PTA, pt lives with family who assisted with ADLs and mobility with Rollator. Pt presents now with diagnoses above s/p surgery with deficits in strength, endurance, balance and pain. Pt currently requires Max A x 2 for bed mobility and transfers due to deficits. Daughter present and engaged in session, OT began education on safety techniques during transfers with further training indicated. Pt requires Mod A for UB ADLs and Total A for LB ADLs at this time. Based on current functional abilities, recommend SNF for short term rehab; however, noted that family is opting for pt to return home. If pt to return home, strongly recommend HHPT/OT follow-up and +2 physical assist (daughter unsure if she will have a second person to assist at home). Plan to further work on transfer training for ADLs and collaborate to determine safest method for completion of ADLs (bed level vs standing with 2 person assist currently).     Follow Up Recommendations  SNF;Supervision/Assistance - 24 hour (HHOT, 24/7 +2 physical assist if declining SNF)    Equipment Recommendations  Wheelchair cushion (measurements OT);Hospital bed;Tub/shower bench (gel or air wc cushion for skin integrity)    Recommendations for Other Services       Precautions / Restrictions Precautions Precautions: Fall;Other (comment) Precaution Comments: decreased skin integrity Restrictions Weight Bearing Restrictions: Yes RLE Weight Bearing: Weight bearing as tolerated       Mobility Bed Mobility Overal bed mobility: Needs Assistance Bed Mobility: Supine to Sit     Supine to sit: Max assist;+2 for physical assistance;+2 for safety/equipment;HOB elevated     General bed mobility comments: Max A x 2 for bed mobility, difficulties somewhat due to air mattress. Educated daughter on use of hospital bed to elevated HOB, have pt reach for bed rails and assistance for B LE    Transfers Overall transfer level: Needs assistance Equipment used: Rolling walker (2 wheeled);None;2 person hand held assist Transfers: Sit to/from [facility] Sit to Stand: Max assist;+2 physical assistance;+2 safety/equipment Stand pivot transfers: Max assist;+2 physical assistance;+2 safety/equipment       General transfer comment: Max A x 2 for all transfers. Trialed youth RW with pt unable to lift B feet enought for pivot. Opted for modified squat pivot Max A x 2 with heavy forward flexion and posterior lean. bed > BSC > recliner    Balance Overall balance assessment: Needs assistance Sitting-balance support: Bilateral upper extremity supported;Feet supported Sitting balance-Leahy Scale: Poor Sitting balance - Comments: Grossly min guard for sitting balance, instances of Min A with posterior LOB Postural control: Posterior lean Standing balance support: Bilateral upper extremity supported;During functional activity Standing balance-Leahy Scale: Zero Standing balance comment: Max A to maintain standing balance                           ADL either performed or assessed with clinical judgement   ADL Overall ADL's : Needs assistance/impaired Eating/Feeding: Set up;Sitting   Grooming: Set up;Sitting   Upper Body Bathing: Moderate assistance;Sitting  Lower Body Bathing: Total assistance;Sit to/from stand;Sitting/lateral leans;Bed level   Upper Body Dressing : Minimal assistance;Sitting   Lower Body Dressing: Sit to/from stand;Bed  level;Sitting/lateral leans;Total assistance Lower Body Dressing Details (indicate cue type and reason): Total A for doffing/donning socks Toilet Transfer: Maximal assistance;+2 for physical assistance;+2 for safety/equipment;Squat-pivot;BSC Toilet Transfer Details (indicate cue type and reason): +2 extensive assist for pivot to [facility]. Trialed youth RW unsuccessfully. Pt with forward flexion and posterior lean Toileting- Clothing Manipulation and Hygiene: Total assistance;Bed level;Sit to/from stand;+2 for physical assistance;+2 for safety/equipment         General ADL Comments: Pt with deficits in pain, endurance and balance. Requires extensive assist for LB ADLs, +2 for LB ADLs in standing     Vision Baseline Vision/History: Wears glasses Wears Glasses: At all times Patient Visual Report: No change from baseline Vision Assessment?: No apparent visual deficits     Perception     Praxis      Pertinent Vitals/Pain Pain Assessment: Faces Faces Pain Scale: Hurts even more Pain Location: R hip with knee flexion and weightbearing Pain Descriptors / Indicators: Grimacing;Guarding Pain Intervention(s): Monitored during session;Repositioned     Hand Dominance Right   Extremity/Trunk Assessment Upper Extremity Assessment Upper Extremity Assessment: Generalized weakness   Lower Extremity Assessment Lower Extremity Assessment: Defer to PT evaluation   Cervical / Trunk Assessment Cervical / Trunk Assessment: Normal   Communication Communication Communication: Prefers language other than [place];Other (comment) (daughter assisted in translating)   Cognition Arousal/Alertness: Awake/alert Behavior During Therapy: WFL for tasks assessed/performed Overall Cognitive Status: History of cognitive impairments - at baseline                                 General Comments: hx of dementia, able to follow one step commands with increased time, decreased awareness of deficits  and safety, difficulty problem solving. Likely at baseline   General Comments  Daughter present, assisted with interpreting during session. Daughter actively engaged, began education on use of gait belt, body mechanics for transfers, and transferring to stronger side. Collaborated with daughter on equipment needs and educated on need for gel or air cushion for wheelchair to prevent further skin breakdown. Educated daughter that wheelchair may be pt's new means of mobility rather than walking with RW.     Exercises     Shoulder Instructions      Home Living Family/patient expects to be discharged to:: Private residence Living Arrangements: Children Available Help at Discharge: Family;Available 24 hours/day Type of Home: House Home Access: Stairs to enter [employer] of Steps: 1   Home Layout: One level     Bathroom Shower/Tub: Teacher, early years/pre: Standard (with riser)     Home Equipment: Walker - 4 wheels;Bedside commode;Shower seat;Wheelchair - manual;Hand held shower head          Prior Functioning/Environment Level of Independence: Needs assistance  Gait / Transfers Assistance Needed: uses rollator for ambulation with assistance from family ADL's / Homemaking Assistance Needed: family assists with all ADLs   Comments: Daughter is very involved in care. Patient lives with son but daughter is there most of the time        OT Problem List: Decreased strength;Decreased activity tolerance;Impaired balance (sitting and/or standing);Decreased cognition;Decreased knowledge of use of DME or AE;Decreased knowledge of precautions;Decreased safety awareness;Pain      OT Treatment/Interventions: Self-care/ADL training;Therapeutic exercise;Energy conservation;DME and/or AE instruction;Therapeutic activities;Patient/family education;Balance training  OT Goals(Current goals can be found in the care plan section) Acute Rehab OT Goals Patient Stated Goal:  increase transfer independence, go home per daughter OT Goal Formulation: With patient/family Time For Goal Achievement: 04/09/20 Potential to Achieve Goals: Fair ADL Goals Pt Will Perform Tub/Shower Transfer: Tub transfer;with mod assist;tub bench;with caregiver independent in assisting Additional ADL Goal #1: Pt/caregiver to demonstrate safe bed mobility at Mod A in preparation for ADL transfers Additional ADL Goal #2: Caregiver to demonstrate safest method for toilet transfers to prevent caregiver/pt injuryand promote optimal performance for ADLs Additional ADL Goal #3: Caregiver to demonstrate most effective and safest method for LB ADLs to maximize independence, prevent injury Additional ADL Goal #4: Pt/caregiver to verbalize at least 3 methods for maintaining skin integrity  OT Frequency: Min 2X/week   Barriers to D/C:            Co-evaluation              AM-PAC OT "6 Clicks" Daily Activity     Outcome Measure Help from another person eating meals?: A Little Help from another person taking care of personal grooming?: A Little Help from another person toileting, which includes using toliet, bedpan, or urinal?: Total Help from another person bathing (including washing, rinsing, drying)?: Total Help from another person to put on and taking off regular upper body clothing?: A Little Help from another person to put on and taking off regular lower body clothing?: Total 6 Click Score: 12   End of Session Equipment Utilized During Treatment: Gait belt;Rolling walker Nurse Communication: Mobility status;Other (comment) (purewick replacement)  Activity Tolerance: Patient limited by fatigue Patient left: in chair;with call bell/phone within reach;with chair alarm set;with nursing/sitter in room  OT Visit Diagnosis: Unsteadiness on feet (R26.81);Other abnormalities of gait and mobility (R26.89);Muscle weakness (generalized) (M62.81);History of falling (Z91.81);Other symptoms and  signs involving cognitive function;Pain Pain - Right/Left: Right Pain - part of body: Hip;Leg                Time: 3664-4034 OT Time Calculation (min): 42 min Charges:  OT General Charges $OT Visit: 1 Visit OT Evaluation $OT Eval Moderate Complexity: 1 Mod OT Treatments $Self Care/Home Management : 8-22 mins $Therapeutic Activity: 8-22 mins  Layla Maw, OTR/L  Layla Maw 03/26/2020, 11:01 AM

## 2020-03-27 DIAGNOSIS — Z419 Encounter for procedure for purposes other than remedying health state, unspecified: Secondary | ICD-10-CM | POA: Diagnosis not present

## 2020-03-27 DIAGNOSIS — S72141A Displaced intertrochanteric fracture of right femur, initial encounter for closed fracture: Secondary | ICD-10-CM | POA: Diagnosis not present

## 2020-03-27 LAB — BASIC METABOLIC PANEL
Anion gap: 8 (ref 5–15)
BUN: 30 mg/dL — ABNORMAL HIGH (ref 8–23)
CO2: 25 mmol/L (ref 22–32)
Calcium: 8 mg/dL — ABNORMAL LOW (ref 8.9–10.3)
Chloride: 99 mmol/L (ref 98–111)
Creatinine, Ser: 1.57 mg/dL — ABNORMAL HIGH (ref 0.44–1.00)
GFR, Estimated: 30 mL/min — ABNORMAL LOW (ref >60)
Glucose, Bld: 86 mg/dL (ref 70–99)
Potassium: 5.1 mmol/L (ref 3.5–5.1)
Sodium: 132 mmol/L — ABNORMAL LOW (ref 135–145)

## 2020-03-27 LAB — CBC
HCT: 29.6 % — ABNORMAL LOW (ref 36.0–46.0)
Hemoglobin: 9.7 g/dL — ABNORMAL LOW (ref 12.0–15.0)
MCH: 32.1 pg (ref 26.0–34.0)
MCHC: 32.8 g/dL (ref 30.0–36.0)
MCV: 98 fL (ref 80.0–100.0)
Platelets: 47 10*3/uL — ABNORMAL LOW (ref 150–400)
RBC: 3.02 MIL/uL — ABNORMAL LOW (ref 3.87–5.11)
RDW: 16.4 % — ABNORMAL HIGH (ref 11.5–15.5)
WBC: 4.8 10*3/uL (ref 4.0–10.5)
nRBC: 0 % (ref 0.0–0.2)

## 2020-03-27 MED ORDER — BISACODYL 10 MG RE SUPP
10.0000 mg | Freq: Once | RECTAL | Status: AC
  Administered 2020-03-27: 10 mg via RECTAL
  Filled 2020-03-27: qty 1

## 2020-03-27 MED ORDER — GERHARDT'S BUTT CREAM
TOPICAL_CREAM | Freq: Three times a day (TID) | CUTANEOUS | Status: DC
  Administered 2020-03-29 – 2020-03-31 (×4): 1 via TOPICAL
  Filled 2020-03-27: qty 1

## 2020-03-27 NOTE — Progress Notes (Signed)
Physical Therapy Treatment Patient Details Name: Jordan [name] MRN: [id number] DOB: [birth year] Today's Date: 03/27/2020    History of Present Illness Jordan [name] is a 86 y.o. female presenting with hip pain after fall, found to have a right hip fracture. PMH is significant for Alzheimer's dementia, bronchiectasis, Gout, CKD IV, HFpEF, HTN, and Afib/complete heart block with pacemaker not currently on Anticoagulation. Patient s/p IM nail R hip on 11/23.     PT Comments    Continuing work on functional mobility and activity tolerance;  Session focused on functional transfers, standing balance and tolerance for ADLs; Continues to require Max assist for trnasfers and ADLs, but showing modest progress, and able to stand with RW today long enough for hygiene after BM   Follow Up Recommendations  Home health PT;Supervision/Assistance - 24 hour     Equipment Recommendations  Rolling walker with 5" wheels;3in1 (PT);Hospital bed (drop-arm 3in1; youth-sized RW; consider ambulance home)    Recommendations for Other Services       Precautions / Restrictions Precautions Precautions: Fall Precaution Comments: decreased skin integrity, compromised R shoulder Restrictions Weight Bearing Restrictions: Yes RLE Weight Bearing: Weight bearing as tolerated    Mobility  Bed Mobility Overal bed mobility: Needs Assistance Bed Mobility: Sit to Supine     Supine to sit: Max assist;+2 for safety/equipment        Transfers Overall transfer level: Needs assistance Equipment used: 2 person hand held assist;Rolling walker (2 wheeled) Transfers: Sit to/from [facility] Sit to Stand: Max assist;+2 physical assistance;+2 safety/equipment Stand pivot transfers: Max assist;+2 physical assistance;+2 safety/equipment       General transfer comment: Stood from commode with Max assist; Pt's duaghter helped with cleaning; difficulty taking steps to Recliner on her L; Once in recliner, practiced  stanidng to youth-sized RW with Max assist of 2  Ambulation/Gait                 Stairs             Wheelchair Mobility    Modified Rankin (Stroke Patients Only)       Balance             Standing balance-Leahy Scale: Poor Standing balance comment: Stood with youth-sized RW for 30-60 seconds; Multimodal cueing for posture and weight shift fully over feet; tended to havea posterior lean, but able to have a few moments of fully supporting self on RW                            Cognition Arousal/Alertness: Awake/alert Behavior During Therapy: WFL for tasks assessed/performed Overall Cognitive Status: History of cognitive impairments - at baseline                                 General Comments: hx of dementia, able to follow one step commands with increased time, decreased awareness of deficits and safety, difficulty problem solving. Likely at baseline      Exercises      General Comments General comments (skin integrity, edema, etc.): Daughter present and helpful throughout session      Pertinent Vitals/Pain Pain Assessment: Faces Faces Pain Scale: Hurts even more Pain Location: R hip with knee flexion and weightbearing Pain Descriptors / Indicators: Grimacing;Guarding Pain Intervention(s): Monitored during session    Home Living  Prior Function            PT Goals (current goals can now be found in the care plan section) Acute Rehab PT Goals Patient Stated Goal: Daughter wishes to know the best way to move her PT Goal Formulation: With patient/family Time For Goal Achievement: 04/08/20 Potential to Achieve Goals: Fair Progress towards PT goals: Progressing toward goals    Frequency    Min 3X/week      PT Plan Current plan remains appropriate    Co-evaluation              AM-PAC PT "6 Clicks" Mobility   Outcome Measure  Help needed turning from your back to your side  while in a flat bed without using bedrails?: A Lot Help needed moving from lying on your back to sitting on the side of a flat bed without using bedrails?: A Lot Help needed moving to and from a bed to a chair (including a wheelchair)?: A Lot Help needed standing up from a chair using your arms (e.g., wheelchair or bedside chair)?: A Lot Help needed to walk in hospital room?: Total Help needed climbing 3-5 steps with a railing? : Total 6 Click Score: 10    End of Session Equipment Utilized During Treatment: Gait belt Activity Tolerance: Patient tolerated treatment well Patient left: in chair;with call bell/phone within reach;with family/visitor present Nurse Communication: Mobility status PT Visit Diagnosis: Unsteadiness on feet (R26.81);Muscle weakness (generalized) (M62.81);History of falling (Z91.81);Difficulty in walking, not elsewhere classified (R26.2)     Time: 1200-1230 PT Time Calculation (min) (ACUTE ONLY): 30 min  Charges:  $Therapeutic Activity: 23-37 mins                     Roney Marion, [place]  Acute Rehabilitation Services Pager [phone] Office [place] 03/27/2020, 4:30 PM

## 2020-03-27 NOTE — TOC Progression Note (Signed)
Transition of Care [facility]) - Progression Note    Patient Details  Name: Jordan [name] MRN: [id number] Date of Birth: 10/19/1919  [facility] CM/SW Contact  Bartholomew Crews, RN Phone Number: [phone] 03/27/2020, 3:19 PM  Clinical Narrative:     Spoke with patient's daughter at the bedside. Daughter stated that patient is fluctuating from day to day with good days and bad days. They have decided to stick with hospice at home for the next couple of weeks, and depending on patient progression may switch to home health. TOC following for transition needs.   Expected Discharge Plan: Home w Hospice Care Barriers to Discharge: Continued Medical Work up  Expected Discharge Plan and Services Expected Discharge Plan: [place] Determinants of Health (SDOH) Interventions    Readmission Risk Interventions No flowsheet data found.

## 2020-03-27 NOTE — Progress Notes (Signed)
Manufacturing engineer [facility] HospitalHospice Patient  Jordan [name] is a current hospice patient admitted 03/06/20 with a terminal diagnosis of hypertensive heart disease. She lives with her son Jordan [name], and her daughter Jordan [name] cares for her during the day. At baseline she ambulates with a walker and stand by assist and wears supplemental O2 at HS and PRN daytime. Patient fell on 03/22/20 and was transported to [facility] ED where she was found to have a right hip fracture. Hospice was notified prior to transport. Patient was admitted to [facility] with hip fracture s/p fall. Per Dr. Tomasa Hosteller this is a related admission.  Patient is alert and oriented at baseline. Patient was able to participate in PT today, daughter was present for this.  She still requires Max assist with bed mobility and transfers, but showing modest progress. Advised to order a drop arm bedside toilet for better transfers at home. Patient is eating about 75% meals.   V/S: 98.3 oral, 146/52, HR 75, RR 18, SPO2 100 % on RA I&O: 160/800 Labs:  03/27/2020 02:36 Sodium: 132 (L) BUN: 30 (H) Creatinine: 1.57 (H) Calcium: 8.0 (L) GFR, Estimated: 30 (L) RBC: 3.02 (L) Hemoglobin: 9.7 (L) HCT: 29.6 (L) RDW: 16.4 (H) Platelets: 47 (L)  Problem List: Acute Right Comminuted Femur Fracture s/p ORIF 11/22: Stable. - PT in hospital, -tylenol scheduled q4hours -Tramadol 50 mg q6 PRN for moderate, encouraged use when in pain  -Norco q 4 PRN for severe -Robaxin q6 PRN Acute on Chronic macrocytic anemia: Improved.  AKI on CKD stage IIIb: Improving. Acute on chronic Thrombocytopenia: Stable, improving Bronchiectasis: chronic Constipation: Chronic, worsening.  GOC: clear. Palliative in nature only. Daughter is having a hard time deciding on if she wants to pursue HHPT or continue with hospice services. Family has advised they would need to stop hospice services in order to pursue HHPT. Currently daughter is wanting to bring patient  home with hospice and try a few PT sessions at home to see how she tolerates it. D/C planning: return home, unclear if she will return with hospice or will choose home health PT. IDT: hospice team updated Family: updated    Please use [facility] for ambulance transport as this service is contracted for [facility] active hospice patients.  Farrel Gordon, RN, CCM   [facility] Liaison (listed on [place] under Hospice/[facility])     [phone]

## 2020-03-27 NOTE — Progress Notes (Signed)
Family Medicine Teaching Service Daily Progress Note Intern Pager: [phone]  [name] Medical record number: [id number] Date of birth: [birth year] Age: 86 y.o. Gender: female  Primary Care Provider: McDiarmid, Blane Ohara, MD Consultants: Orthopedics Code Status: DNR  Pt Overview and Major Events to Date:  11/22: admitted, underwent ORIF w/intramedullary nail, transfused 1-2u pRBCs 11/23: transfused additional 2u pRBCs  Assessment and Plan: Jordan [name] a 86 y.o.femalewhopresentedwith hip pain after fall,found to have a right hip fracture. PMH is significant forAlzheimer's dementia, bronchiectasis,gout,CKDIV, HFpEF, HTN, andAfib/complete heart block withpacemakernot currently onanticoagulation.  Acute Right Comminuted Femur Fracture s/p ORIF 11/22: Stable.  Pain well controlled currently. Continued oozing at dressing, likely contributory thrombocytopenia.  2+ assist for transfers but modest improvement with PT yesterday.  Will work towards increasing safety of transfers and movement over the weekend with hopeful discharge early next week. -Appreciate PT/OT assistance in acute rehab  -Tylenol solution q4 hours scheduled -Tramadol 50 mg q6 PRN for moderate, encouraged use when in pain  -Norco q 4 PRN for severe -Robaxin q6 PRN -Will have bandage changed -Placed DME orders for 3n1, wheelchair cushion, and Hoyer lift  Acute on Chronic macrocytic anemia: Improved.  Hemoglobin pending this am (9.7 yesterday). S/p 3-4 pRBCs total during admit.  Continues to have some oozing from surgical dressing, will monitor.  --Monitor CBC  --Monitor site for any further bleeding --Cont hold anti-coagulation as below (w/ low plts as well), SCDs on   AKI on CKD stage IIIb: Improving. Cr 1.37, from 1.57.  Baseline around 1.2. -Encourage oral hydration -Will monitor BMP per daughter request -Avoid nephrotoxic meds   Acute on chronic Thrombocytopenia: Stable.  Plts pending this  am, 47 yesterday.  Likely reactive due to increased stressors. -Monitor CBC  Hospice Care Patient currently under [facility] hospice services.   Will need to decide on home health PT vs with hospice in the next several days for discharge planning, appears to be leaning towards home with hospice and few PT sessions. -Hospice following  Bronchiectasis: chronic On 2L home O2 as needed. -Continue supplemental O2  A-fib/complete heart block s/p pacemaker insertion: Stable. Appears in sinus currently.  Pacemaker in place. Not on anticoagulation due to hospice and daughter wishes to continuing holding this even if she goes off of hospice briefly for rehab. -Continue to monitor  HFpEF: Chronic, stable.  Last echo June 2021 showed EF 60-65% and was otherwise normal. On Lasix 20mg  daily prn for edema, appears euvolemic. -Holding home Lasix -Monitor fluid status  Constipation: Chronic, improving. Large BM 11/26. Will maintain daily regimen.  -Dulcolax suppository PRN  -Continue MiraLAX daily and docusate BID  Hypothyroidism: chronic, stable On Synthroid 116mcg at home. -Continue home synthroid  Gout: chronic, stable On colchicine 0.6mg  BID and allopurinol 100mg  daily at home. -Continue home allopurinol -Colchicine at 0.3mg  daily due to renal function  Chronic back pain: Stable.  On 50mg  tramadol q6h prn and gabapentin 200mg  BID at home. -Tramadol 50mg  q6h prn -Cont gabapentin 100 mg TID renal dosed   Depression: chronic, stable On Mirtazapine 50mg  daily and trazodone 50mg  daily at home. -Continue home mirtazapine -Holding home trazodone  FEN/GI: soft diet PPx: SCDs  Status is: Inpatient Remains inpatient appropriate because:Unsafe d/c plan.    Dispo: The patient is from: Home              Anticipated d/c is to: Home              Anticipated d/c date  is: 2 days              Patient currently is not medically stable to d/c.   Subjective:  No acute events overnight.   Daughter feels like she had a better night last night and worked hard with PT/OT yesterday.  Had a large bowel movement yesterday.  Her daughter is leaning towards continuing with hospice with a few sessions of home PT, however she would like to see how she progresses over the weekend if they consider transition to home health PT instead.  Reports they do have a hospital bed and shower bench at home already.  Objective: Temp:  [97.7 F (36.5 C)-98.3 F (36.8 C)] 98.1 F (36.7 C) (11/26 1935) Pulse Rate:  [71-76] 76 (11/26 1935) Resp:  [17-18] 18 (11/26 1935) BP: (146-155)/(49-53) 146/53 (11/26 1935) SpO2:  [98 %-100 %] 100 % (11/26 1935) Physical Exam: General: Alert, NAD, daughter at bedside  HEENT: NCAT, MMM Cardiac: RRR Lungs: Clear bilaterally, no increased WOB  Abdomen: soft, non-tender, non-distended, normoactive BS Msk: Moves all extremities spontaneously, adherent dressing present on right upper thigh with superior saturation of dressing.  Notable ecchymoses surrounding superior aspect. Ext: Warm, dry, 2+ DP pulses, no edema bilaterally    Laboratory: Recent Labs  Lab 03/24/20 2024 03/25/20 1533 03/27/20 0236  WBC 5.6 5.7 4.8  HGB 10.9* 10.7* 9.7*  HCT 32.5* 32.3* 29.6*  PLT 33* 36* 47*   Recent Labs  Lab 03/24/20 0138 03/26/20 0714 03/27/20 0236  NA 130* 131* 132*  K 5.2* 4.9 5.1  CL 96* 99 99  CO2 26 26 25   BUN 25* 29* 30*  CREATININE 1.45* 1.62* 1.57*  CALCIUM 7.5* 7.6* 8.0*  GLUCOSE 105* 80 86    Imaging/Diagnostic Tests: No new imaging/diagnostic tests   Patriciaann Clan, DO 03/27/2020, 8:28 PM PGY-3, [place] Intern pager: [phone], text pages welcome

## 2020-03-27 NOTE — Progress Notes (Signed)
Subjective: 4 Days Post-Op Procedure(s) (LRB): INTRAMEDULLARY (IM) NAIL INTERTROCHANTRIC (Right) Patient reports pain as moderate.  Controlled with meds.  Pt struggling with constipation.  Daughter at bedside.  Dr. Alvan Dame has already been by today.  Objective: Vital signs in last 24 hours: Temp:  [97.7 F (36.5 C)] 97.7 F (36.5 C) (11/25 2100) Pulse Rate:  [71] 71 (11/25 2100) Resp:  [17] 17 (11/25 2100) BP: (155)/(49) 155/49 (11/25 2100) SpO2:  [98 %] 98 % (11/25 2100)  Intake/Output from previous day: 11/25 0701 - 11/26 0700 In: 160 [P.O.:160] Out: 800 [Urine:800] Intake/Output this shift: Total I/O In: -  Out: 400 [Urine:400]  Recent Labs    03/24/20 2024 03/25/20 1533 03/27/20 0236  HGB 10.9* 10.7* 9.7*   Recent Labs    03/25/20 1533 03/27/20 0236  WBC 5.7 4.8  RBC 3.40* 3.02*  HCT 32.3* 29.6*  PLT 36* 47*   Recent Labs    03/26/20 0714 03/27/20 0236  NA 131* 132*  K 4.9 5.1  CL 99 99  CO2 26 25  BUN 29* 30*  CREATININE 1.62* 1.57*  GLUCOSE 80 86  CALCIUM 7.6* 8.0*   No results for input(s): LABPT, INR in the last 72 hours.  PE:  Elderly female resting comfortably.  R hip incision with moderate ecchymosis.  No signs of infection.  Dressing changed earlier by Dr. Alvan Dame.  The incision was intact.   Assessment/Plan: 4 Days Post-Op Procedure(s) (LRB): INTRAMEDULLARY (IM) NAIL INTERTROCHANTRIC (Right) Discussed discharge plans with pt's daughter.  They plan to take her home once constipation is resolved.  Continue WBAT on R LE. D/w Dr. Alvan Dame.   Wylene Simmer 03/27/2020, 8:48 AM

## 2020-03-27 NOTE — Progress Notes (Signed)
Physical Therapy Treatment Patient Details Name: Jordan [name] MRN: [id number] DOB: Jul 15, 1919 Today's Date: 03/27/2020    History of Present Illness OZELLA COMINS is a 86 y.o. female presenting with hip pain after fall, found to have a right hip fracture. PMH is significant for Alzheimer's dementia, bronchiectasis, Gout, CKD IV, HFpEF, HTN, and Afib/complete heart block with pacemaker not currently on Anticoagulation. Patient s/p IM nail R hip on 11/23.     PT Comments    Continuing work on functional mobility and activity tolerance;  Session focused on practicing options for functional transfers, and opted to scoot to drop-arm [facility] because there is also concern re: constipation; Overall pt still requires Max assist with bed mobility and transfers, but showing modest progress; Pt's daughter indicates she likes having the option for lateral scooting as well; Max assist to scoot to the [facility], and then at least mod assist to lateral lean both sides to get bed pads out from under her;   Spero Geralds, Medical Interpreter was present to facilitate communication -- and pt's daughter, Stasia Cavalier assists well with interpretation;   Took extra time to emphasize with [facility] that while it is painful to move and bear weight on her RLE, it will not cause damage to the R hip, and that some pain is concomitant with moving and transfers; Ended session with pt on [facility], working on moving her bowels; Her daughter was present with her.  Discussed options for transfers with Judson Roch, RN, and Beverlee Nims, [place].   Follow Up Recommendations  Home health PT;Supervision/Assistance - 24 hour;Other (comment) (at this point needs 2 person assist)Is the [place] [facility] available to Ms. Tostenson?     Equipment Recommendations  Rolling walker with 5" wheels;3in1 (PT);Hospital bed (drop-arm 3in1; youth-sized RW)    Recommendations for Other Services       Precautions / Restrictions Precautions Precautions: Fall;Other  (comment) Precaution Comments: decreased skin integrity Restrictions RLE Weight Bearing: Weight bearing as tolerated    Mobility  Bed Mobility Overal bed mobility: Needs Assistance Bed Mobility: Supine to Sit     Supine to sit: Max assist;+2 for safety/equipment     General bed mobility comments: Cues for using LLE to semi-half-bridge to EOB, with good pt-initiated short scoots; Cues to use both hands at bedrail to initiate semi-roll of trunk to L; Max assist to elevate trunk to sit  Transfers Overall transfer level: Needs assistance Equipment used:  (bed bad) Transfers: Lateral/Scoot Transfers          Lateral/Scoot Transfers: Max assist General transfer comment: Used bed pad to scoot pt to drop-arm BSC on her L side; Pt responded well to cues to reach for far armrest, and scoot; Max assist to complete scoot to [facility], with limited use of LEs as a pivot point  Ambulation/Gait                 Stairs             Wheelchair Mobility    Modified Rankin (Stroke Patients Only)       Balance     Sitting balance-Leahy Scale: Poor (Approaching Fair)                                      Cognition Arousal/Alertness: Awake/alert Behavior During Therapy: WFL for tasks assessed/performed Overall Cognitive Status: History of cognitive impairments - at baseline  General Comments: hx of dementia, able to follow one step commands with increased time, decreased awareness of deficits and safety, difficulty problem solving. Likely at baseline      Exercises      General Comments        Pertinent Vitals/Pain Pain Assessment: Faces Faces Pain Scale: Hurts little more Pain Location: R hip with knee flexion and weightbearing Pain Descriptors / Indicators: Grimacing;Guarding Pain Intervention(s): Monitored during session;Repositioned    Home Living                      Prior Function             PT Goals (current goals can now be found in the care plan section) Acute Rehab PT Goals Patient Stated Goal: increase transfer independence, go home per daughter PT Goal Formulation: With patient/family Time For Goal Achievement: 04/08/20 Potential to Achieve Goals: Fair Progress towards PT goals: Progressing toward goals (slowly)    Frequency    Min 3X/week      PT Plan Current plan remains appropriate    Co-evaluation              AM-PAC PT "6 Clicks" Mobility   Outcome Measure  Help needed turning from your back to your side while in a flat bed without using bedrails?: A Lot Help needed moving from lying on your back to sitting on the side of a flat bed without using bedrails?: A Lot Help needed moving to and from a bed to a chair (including a wheelchair)?: A Lot Help needed standing up from a chair using your arms (e.g., wheelchair or bedside chair)?: A Lot Help needed to walk in hospital room?: Total   6 Click Score: 9    End of Session Equipment Utilized During Treatment: Gait belt Activity Tolerance: Patient tolerated treatment well Patient left: with call bell/phone within reach;with family/visitor present (On BSC) Nurse Communication: Mobility status PT Visit Diagnosis: Unsteadiness on feet (R26.81);Muscle weakness (generalized) (M62.81);History of falling (Z91.81);Difficulty in walking, not elsewhere classified (R26.2)     Time: 6389-3734 PT Time Calculation (min) (ACUTE ONLY): 56 min  Charges:  $Therapeutic Activity: 38-52 mins                     Roney Marion, [place]  Acute Rehabilitation Services Pager [phone] Office [place] 03/27/2020, 12:50 PM

## 2020-03-27 NOTE — Consult Note (Signed)
WOC Nurse Consult Note: Patient receiving care in [facility] [phone].  Her daughter was present in the room and able to translate for me. Reason for Consult: MASD in the abdominal folds/peri area Wound type: MASD- IAD and ITD to the bilateral groin areas, and inner/upper thighs and vaginal areas Pressure Injury POA: Yes/No/NA Measurement: na Wound bed: erythematous with satellite lesions consistent with fungal involvement Drainage (amount, consistency, odor) na Periwound: intact Dressing procedure/placement/frequency: Gerhardt's butt cream to bilateral groin areas, and inner/upper thighs and vaginal areas.  Perform TID and with each occurrence of peri-care.  Monitor the wound area(s) for worsening of condition such as: Signs/symptoms of infection,  Increase in size,  Development of or worsening of odor, Development of pain, or increased pain at the affected locations.  Notify the medical team if any of these develop.  Thank you for the consult.  Discussed plan of care with the patient's daughter and bedside nurse.  [place] nurse will not follow at this time.  Please re-consult the [place] team if needed.  Val Riles, RN, MSN, CWOCN, CNS-BC, pager [phone]

## 2020-03-27 NOTE — Progress Notes (Signed)
Family Medicine Teaching Service Daily Progress Note Intern Pager: [phone]  [name] Medical record number: [id number] Date of birth: October 03, 1919 Age: 86 y.o. Gender: female  Primary Care Provider: McDiarmid, Blane Ohara, MD Consultants: Orthopedics Code Status: DNR  Pt Overview and Major Events to Date:  11/22: admitted, underwent ORIF w/intramedullary nail, transfused 1-2u pRBCs 11/23: transfused additional 2u pRBCs  Assessment and Plan: Jordan [name] Diazis a 86 y.o.femalewhopresentedwith hip pain after fall,found to have a right hip fracture. PMH is significant forAlzheimer's dementia, bronchiectasis,gout,CKDIV, HFpEF, HTN, andAfib/complete heart block withpacemakernot currently onanticoagulation.  Acute Right Comminuted Femur Fracture s/p ORIF 11/22: Stable.  Moderately managed pain, however new dressing appears clean and dry without any s/sx of infection.  2+ assist for transfers. -Appreciate PT/OT assistance in acute rehab while in hospital so that she can hopefully safely return home with hospice -Tylenol solution q4 hours scheduled -Tramadol 50 mg q6 PRN for moderate, encouraged use when in pain  -Norco q 4 PRN for severe -Robaxin q6 PRN  Acute on Chronic macrocytic anemia: Improved.  Hemoglobin 9.7 (10.7 yesterday). S/p 3-4 pRBCs total during admit. Oozing from surgical dressing has improved, bandages replaced by orthopedics yesterday, 11/25. --Monitor CBC  --Monitor site for any further bleeding --Cont hold anti-coagulation as below (w/ low plts as well), SCDs on   AKI on CKD stage IIIb: Improving. Cr 1.57, from 1.62.  Baseline around 1.2. -Monitor BMP per daughter request -Avoid nephrotoxic meds   Acute on chronic Thrombocytopenia: Stable, improving.  Plts 47, from 36 yesterday.  Likely reactive due to increased stressors. -Monitor CBC  Hospice Care Patient currently under [facility] hospice services.   Will need to decide on home health PT vs  with hospice in the next several days for discharge planning. -Hospice following -Will pursue palliative treatments only per daughter/POA.  Bronchiectasis: chronic On 2L home O2 as needed. -Continue supplemental O2  A-fib/complete heart block s/p pacemaker insertion: Stable. Appears in sinus currently.  Pacemaker in place. Not on anticoagulation due to hospice. -Continue to monitor  HFpEF: Chronic, stable.  Last echo June 2021 showed EF 60-65% and was otherwise normal. On Lasix 20mg  daily prn for edema, appears euvolemic. -Holding home Lasix -Monitor fluid status  Constipation: Chronic, worsening. Has not had a bowel movement in several days, already on MiraLax and docusate.  Likely acute on chronic with opioid use.  Daughter requesting trial of suppository. -Dulcolax suppository -Continue MiraLAX and docusate  Hypothyroidism: chronic, stable On Synthroid 119mcg at home. -Continue home synthroid  Gout: chronic, stable On colchicine 0.6mg  BID and allopurinol 100mg  daily at home. -Continue home allopurinol -Colchicine at 0.3mg  daily due to renal function  Chronic back pain: Stable.  On 50mg  tramadol q6h prn and gabapentin 200mg  BID at home. -Tramadol 50mg  q6h prn -Cont gabapentin 100 mg TID renal dosed   Depression: chronic, stable On Mirtazapine 50mg  daily and trazodone 50mg  daily at home. -Continue home mirtazapine -Holding home trazodone  FEN/GI: soft diet PPx: SCDs  Status is: Inpatient Remains inpatient appropriate because:Unsafe d/c plan.  Will work towards improved pain management over the course of today and hopeful to work with PT/OT in efforts for safe transition home with transfers.  Spoke with 6N RN staff to allow daughters to transition in and out, will discuss with Mudlogger.   Dispo: The patient is from: Home              Anticipated d/c is to: Home  Anticipated d/c date is: 2 days              Patient currently is not medically  stable to d/c.   Subjective:  No acute events overnight.  Spoke with patient and daughter at bedside, daughter provides Spanish translation to patient.  Daughter reports she has been complaining of pain more.  She has been taking Tylenol and used tramadol once.  Is hesitant to take any additional opioid for pain relief.  Daughter also reports she has not had a bowel movement in several days, use MiraLAX twice and on docusate without success.  Requesting suppository.  Daughter would like to see if it is possible for her to switch out with her sister at certain times.  Having a trusted family member is considered important piece in this patient's recovery as she aids with transfers, adequate communication with translation, and nutrition.  Objective: Temp:  [97.7 F (36.5 C)] 97.7 F (36.5 C) (11/25 2100) Pulse Rate:  [67-71] 71 (11/25 2100) Resp:  [14-18] 17 (11/25 2100) BP: (124-155)/(45-58) 155/49 (11/25 2100) SpO2:  [95 %-98 %] 98 % (11/25 2100) Physical Exam: General: Alert, NAD, daughter at bedside HEENT: NCAT, MMM Cardiac: RRR with normal S1/S2, palpable radial pulses and dorsalis pedis bilaterally Lungs: Clear bilaterally, no increased WOB  Abdomen: soft, non-tender Msk: Moves all extremities spontaneously, adherent dressing present on right upper thigh with minimal saturation. Mild surrounding mild ecchymoses, no skin erythema.  Ext: Warm, dry, 2+ DP distal pulses, no edema b/l, sensation to light touch intact throughout lower extremity  Laboratory: Recent Labs  Lab 03/24/20 0138 03/24/20 2024 03/25/20 1533  WBC 5.5 5.6 5.7  HGB 6.8* 10.9* 10.7*  HCT 20.9* 32.5* 32.3*  PLT 39* 33* 36*   Recent Labs  Lab 03/23/20 1211 03/24/20 0138 03/26/20 0714  NA 134* 130* 131*  K 4.5 5.2* 4.9  CL 102 96* 99  CO2 26 26 26   BUN 20 25* 29*  CREATININE 1.21* 1.45* 1.62*  CALCIUM 7.0* 7.5* 7.6*  GLUCOSE 79 105* 80    Imaging/Diagnostic Tests: No new imaging/diagnostic  tests   Patriciaann Clan, DO 03/27/2020, 1:35 AM PGY-3, [place] Intern pager: [phone], text pages welcome

## 2020-03-27 NOTE — Progress Notes (Signed)
Occupational Therapy Treatment Patient Details Name: Jordan [name] MRN: [id number] DOB: [birth year] Today's Date: 03/27/2020    History of present illness Jordan [name] is a 86 y.o. female presenting with hip pain after fall, found to have a right hip fracture. PMH is significant for Alzheimer's dementia, bronchiectasis, Gout, CKD IV, HFpEF, HTN, and Afib/complete heart block with pacemaker not currently on Anticoagulation. Patient s/p IM nail R hip on 11/23.    OT comments  Patient present pain to right leg limiting ability to assist with transfers, and baseline cognitive impairment impacting function.  Patient was able to feed herself with setup, and complete light grooming with setup and cues.  Patient unable to assist with sit to stand trial, and slide transfer was limited due to the height difference between the recliner and the air mattress.  ADL independence will be limited until her surgical pain lessens.  OT will focus on sit to stand in prep for stand pivot transfers.  Family appears to be leaning to hospice with a few [place] PT visits.  OT will continue to follow in the acute setting.    Follow Up Recommendations  SNF;Supervision/Assistance - 24 hour    Equipment Recommendations  Wheelchair cushion (measurements OT);Hospital bed;Tub/shower bench;Other (comment) (Hoyer lift, Drop arm bedside commode)    Recommendations for Other Services      Precautions / Restrictions Precautions Precautions: Fall Precaution Comments: decreased skin integrity, compromised R shoulder Restrictions Weight Bearing Restrictions: Yes RLE Weight Bearing: Weight bearing as tolerated       Mobility Bed Mobility Overal bed mobility: Needs Assistance Bed Mobility: Sit to Supine     Supine to sit: Max assist;+2 for safety/equipment     General bed mobility comments: Cues for using LLE to semi-half-bridge to EOB, with good pt-initiated short scoots; Cues to use both hands at bedrail to initiate semi-roll of  trunk to L; Max assist to elevate trunk to sit  Transfers Overall transfer level: Needs assistance Equipment used:  (bed bad) Transfers: Lateral/Scoot Transfers          Lateral/Scoot Transfers: Max assist General transfer comment: Used bed pad to scoot pt to drop-arm BSC on her L side; Pt responded well to cues to reach for far armrest, and scoot; Max assist to complete scoot to [facility], with limited use of LEs as a pivot point    Balance     Sitting balance-Leahy Scale: Poor (Approaching Fair)                                     ADL either performed or assessed with clinical judgement   ADL   Eating/Feeding: Set up;Sitting   Grooming: Set up;Sitting                   Toilet Transfer: Maximal assistance;+2 for safety/equipment;Squat-pivot                                     Cognition Arousal/Alertness: Awake/alert Behavior During Therapy: WFL for tasks assessed/performed Overall Cognitive Status: History of cognitive impairments - at baseline                                 General Comments: hx of dementia, able to follow one step commands with increased  time, decreased awareness of deficits and safety, difficulty problem solving. Likely at baseline        Exercises     Shoulder Instructions       General Comments      Pertinent Vitals/ Pain       Pain Assessment: Faces Faces Pain Scale: Hurts even more Pain Location: R hip with knee flexion and weightbearing Pain Descriptors / Indicators: Grimacing;Guarding Pain Intervention(s): Monitored during session;Repositioned                                                          Frequency  Min 2X/week        Progress Toward Goals  OT Goals(current goals can now be found in the care plan section)  Progress towards OT goals: Progressing toward goals  Acute Rehab OT Goals Patient Stated Goal: Daughter wishes to know the best way to  move her OT Goal Formulation: With family Time For Goal Achievement: 04/09/20 Potential to Achieve Goals: [place] Discharge plan remains appropriate    Co-evaluation                 AM-PAC OT "6 Clicks" Daily Activity     Outcome Measure   Help from another person eating meals?: A Little Help from another person taking care of personal grooming?: A Little Help from another person toileting, which includes using toliet, bedpan, or urinal?: Total Help from another person bathing (including washing, rinsing, drying)?: Total Help from another person to put on and taking off regular upper body clothing?: A Little Help from another person to put on and taking off regular lower body clothing?: Total 6 Click Score: 12    End of Session Equipment Utilized During Treatment: Gait belt  OT Visit Diagnosis: Unsteadiness on feet (R26.81);Other abnormalities of gait and mobility (R26.89);Muscle weakness (generalized) (M62.81);History of falling (Z91.81);Other symptoms and signs involving cognitive function;Pain Pain - Right/Left: Right Pain - part of body: Hip;Leg   Activity Tolerance Patient limited by pain   Patient Left in bed;with call bell/phone within reach;with family/visitor present   Nurse Communication Mobility status        Time: 7026-3785 OT Time Calculation (min): 23 min  Charges: OT General Charges $OT Visit: 1 Visit OT Treatments $Self Care/Home Management : 23-37 mins  03/27/2020  [place], OTR/L  Acute Rehabilitation Services  Office:  [phone]    Jordan [name] 03/27/2020, 2:43 PM

## 2020-03-28 DIAGNOSIS — S72141A Displaced intertrochanteric fracture of right femur, initial encounter for closed fracture: Secondary | ICD-10-CM | POA: Diagnosis not present

## 2020-03-28 LAB — BASIC METABOLIC PANEL
Anion gap: 5 (ref 5–15)
BUN: 30 mg/dL — ABNORMAL HIGH (ref 8–23)
CO2: 28 mmol/L (ref 22–32)
Calcium: 8 mg/dL — ABNORMAL LOW (ref 8.9–10.3)
Chloride: 100 mmol/L (ref 98–111)
Creatinine, Ser: 1.37 mg/dL — ABNORMAL HIGH (ref 0.44–1.00)
GFR, Estimated: 35 mL/min — ABNORMAL LOW (ref >60)
Glucose, Bld: 86 mg/dL (ref 70–99)
Potassium: 4.8 mmol/L (ref 3.5–5.1)
Sodium: 133 mmol/L — ABNORMAL LOW (ref 135–145)

## 2020-03-28 LAB — CBC
HCT: 26.8 % — ABNORMAL LOW (ref 36.0–46.0)
Hemoglobin: 8.8 g/dL — ABNORMAL LOW (ref 12.0–15.0)
MCH: 32 pg (ref 26.0–34.0)
MCHC: 32.8 g/dL (ref 30.0–36.0)
MCV: 97.5 fL (ref 80.0–100.0)
Platelets: 50 10*3/uL — ABNORMAL LOW (ref 150–400)
RBC: 2.75 MIL/uL — ABNORMAL LOW (ref 3.87–5.11)
RDW: 16.3 % — ABNORMAL HIGH (ref 11.5–15.5)
WBC: 4.2 10*3/uL (ref 4.0–10.5)
nRBC: 0 % (ref 0.0–0.2)

## 2020-03-28 NOTE — Progress Notes (Signed)
Manufacturing engineer [facility]) HospitalHospice Patient  Lavelle Berland is a current hospice patient admitted 03/06/20 with a terminal diagnosis of hypertensive heart disease. She lives with her son [name], and her daughter [name] cares for her during the day. At baseline she ambulates with a walker and stand by assist and wears supplemental O2 at HS and PRN daytime. Patient fell on 03/22/20 and was transported to [facility] ED where she was found to have a right hip fracture. Hospice was notified prior to transport. Patient was admitted to [facility] with hip fracture s/p fall. Per Dr. Tomasa Hosteller this is a related admission.  Patient was sleeping, daughter and son at bedside. Daughter reports patient worked with PT earlier today and provided education to family to assist in patient transfers. She has not eaten today. Minimal PO fluid intake. She did have BM once assisted to [facility] per dtr.   VS: 97.7 152/48, 79, 18, 100% RA I/O: 75/400  LABS:  03/28/2020 06:54 Sodium: 133 (L) BUN: 30 (H) Creatinine: 1.37 (H) Calcium: 8.0 (L) GFR, Estimated: 35 (L) RBC: 2.75 (L) Hemoglobin: 8.8 (L) HCT: 26.8 (L) RDW: 16.3 (H) Platelets: 50 (L)  Problem List: Acute Right Comminuted Femur Fracture s/p ORIF 11/22: Stable. - PT in hospital, -tylenol scheduled q4hours -Tramadol 50 mgq6PRN for moderate, encouraged use when in pain -Norcoq 4 PRN for severe -Robaxinq6 PRN Acute on Chronic macrocytic anemia: Improved.  AKI on CKD stage IIIb: Improving. Acute on chronic Thrombocytopenia: Stable,improving Bronchiectasis: chronic Constipation: Chronic, worsening.  GOC: clear. Palliative in nature only. Daughter is having a hard time deciding on if she wants to pursue HHPT or continue with hospice services. Family has advised they would need to stop hospice services in order to pursue HHPT (they cannot afford to pay OOP for additional treatments). Currently daughter is wanting to bring patient home with hospice  and try a few PT sessions at home to see how she tolerates it but will see how she does with hospital PT for now.  D/C planning: return home, unclear if she will return with hospice or will choose home health PT. IDT: hospice team updated Family: updated    Please use [facility] for ambulance transport as this service is contracted for [facility] active hospice patients.  Farrel Gordon, RN, CCM   [facility] Liaison (listed on [place] under Hospice/[facility])     [phone]

## 2020-03-28 NOTE — Progress Notes (Addendum)
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 PMH is significant for Alzheimer's dementia, bronchiectasis, Gout, CKD IV, HFpEF, HTN, and Afib/complete heart block with pacemaker not currently on Anticoagulation. Patient s/p IM nail R hip on 11/23.    OT comments  OT with second visit this date.  Daughter requesting for OT to help patient back to the bed.  OT had son assist this time with two person stand pivot from the recliner to the bed.  OT educated daughter and son on the best way to scoot her higher in the bed with draw sheet.  OT educated on use of draw sheet to roll the patient from side to side to straighten items under her and ensure skin integrity.  OT educated family on use of draw sheet to roll the patient on her side and using pillows to bolster off her bottom for pressure relief.  Daughter and son able to provide return demonstration, and verbalize understanding.  Plan appears to be home with hospice and a few PT visits.  OT to continue in the acute setting for family education and patient treatment.    Follow Up Recommendations  Supervision/Assistance - 24 hour    Equipment Recommendations  Wheelchair (20x18' with elevating leg rests and removable desk length arm rests);Hospital bed;Tub/shower bench;Other (? Engineer, petroleum [phone]").     Recommendations for Other Services      Precautions / Restrictions Precautions Precautions: Fall Precaution Comments: decreased skin integrity, compromised R shoulder Restrictions Weight Bearing Restrictions: Yes RLE Weight Bearing: Weight bearing as tolerated       Mobility Bed Mobility Overal bed mobility: Needs Assistance Bed Mobility: Sit to Supine     Supine to sit: Max assist;+2 for physical assistance         Transfers Overall transfer level: Needs assistance Equipment used: Rolling walker (2 wheeled) Transfers: Sit to/from [facility] Sit to Stand: Mod assist;+2 physical assistance Stand pivot transfers: Mod assist;+2 physical assistance       General transfer comment: Patient with increased difficulty recliner to bed due to beds height.  Difficulty getting her hips far enough back on the bed.    Balance           Standing balance support: Bilateral upper extremity supported;During functional activity                                 ADL                           Toilet Transfer: +2 for physical assistance;+2 for safety/equipment;Moderate assistance;Stand-pivot Toilet Transfer Details (indicate cue type and reason): able to stand more upright and place weight through RLE Toileting- Clothing Manipulation and Hygiene: +2 for physical assistance;Total assistance;+2 for safety/equipment;Sit to/from stand  Pertinent Vitals/ Pain       Faces Pain Scale: Hurts even more Pain Location: R hip with knee flexion and weightbearing Pain Descriptors / Indicators: Grimacing;Guarding Pain Intervention(s): Monitored during session;Repositioned                                                          Frequency  Min 2X/week        Progress Toward Goals  OT Goals(current goals can now be found in the care plan section)  Progress towards OT goals: Progressing toward goals  Acute Rehab OT Goals Patient Stated Goal: Daughter wishes to know the best way to move her OT Goal Formulation: With family Time For Goal Achievement: 04/09/20 Potential to Achieve Goals: [place] Discharge plan remains appropriate    Co-evaluation                 AM-PAC OT "6 Clicks" Daily  Activity     Outcome Measure   Help from another person eating meals?: A Little Help from another person taking care of personal grooming?: A Little Help from another person toileting, which includes using toliet, bedpan, or urinal?: Total Help from another person bathing (including washing, rinsing, drying)?: Total Help from another person to put on and taking off regular upper body clothing?: A Little Help from another person to put on and taking off regular lower body clothing?: Total 6 Click Score: 12    End of Session Equipment Utilized During Treatment: Gait belt;Rolling walker  OT Visit Diagnosis: Unsteadiness on feet (R26.81);Other abnormalities of gait and mobility (R26.89);Muscle weakness (generalized) (M62.81);History of falling (Z91.81);Other symptoms and signs involving cognitive function;Pain Pain - Right/Left: Right Pain - part of body: Hip;Leg   Activity Tolerance Patient tolerated treatment well   Patient Left with call bell/phone within reach;with family/visitor present;in bed   Nurse Communication Mobility status        Time: 2979-8921 OT Time Calculation (min): 21 min  Charges: OT General Charges $OT Visit: Second visit OT Treatments  $Therapeutic Activity: 8-22 mins  03/28/2020  Rich, OTR/L  Acute Rehabilitation Services  Office:  [phone]    Metta Clines 03/28/2020, 2:57 PM

## 2020-03-28 NOTE — Progress Notes (Addendum)
Occupational Therapy Treatment Patient Details Name: Jordan [name] MRN: [id number] DOB: 1920/03/01 Today's Date: 03/28/2020    History of present illness Jordan [name] is a 86 y.o. female presenting with hip pain after fall, found to have a right hip fracture. PMH is significant for Alzheimer's dementia, bronchiectasis, Gout, CKD IV, HFpEF, HTN, and Afib/complete heart block with pacemaker not currently on Anticoagulation. Patient s/p IM nail R hip on 11/23.    OT comments  Patient continues to present with R leg pain with movement and mobility.  She demo'd improved sit to stand this date with Mod A x2, able to stand and pivot to [facility] then to recliner with gait belt, Mod A of 2 assist, and RW.  A lot of the session included education for the family regarding use of draw sheets for rolling side to side, helping to scoot her to the edge in sitting, and the proper way to apply the gait belt and using 2 assist for standing and transfers.  Advised the daughter to apply light tactile cueing to her low back to encourage upright standing.  The patient appeared to tolerate light ROM to the right hip better, and was able to stand taller and place weight through her R leg a little better.  Continue to follow in the acute setting.  It appears the plan is home with hospice, DME and a few [place] PT visits.    Follow Up Recommendations  Supervision/Assistance - 24 hour    Equipment Recommendations  Wheelchair cushion (measurements OT);Hospital bed;Tub/shower bench;Other (comment)    Recommendations for Other Services      Precautions / Restrictions Precautions Precautions: Fall Precaution Comments: decreased skin integrity, compromised R shoulder Restrictions Weight Bearing Restrictions: Yes RLE Weight Bearing: Weight bearing as tolerated       Mobility Bed Mobility   Bed Mobility: Sit to Supine     Supine to sit: Mod assist;HOB elevated        Transfers Overall transfer level: Needs  assistance Equipment used: Rolling walker (2 wheeled) Transfers: Sit to/from [facility] Sit to Stand: Mod assist;+2 physical assistance Stand pivot transfers: Mod assist;+2 physical assistance            Balance           Standing balance support: Bilateral upper extremity supported;During functional activity                               ADL either performed or assessed with clinical judgement   ADL                           Toilet Transfer: +2 for physical assistance;+2 for safety/equipment;Moderate assistance;Stand-pivot Toilet Transfer Details (indicate cue type and reason): able to stand more upright and place weight through RLE Toileting- Clothing Manipulation and Hygiene: +2 for physical assistance;Total assistance;+2 for safety/equipment;Sit to/from stand  Pertinent Vitals/ Pain       Faces Pain Scale: Hurts little more Pain Location: R hip with knee flexion and weightbearing Pain Descriptors / Indicators: Grimacing;Guarding Pain Intervention(s): Monitored during session                                                          Frequency  Min 2X/week        Progress Toward Goals  OT Goals(current goals can now be found in the care plan section)  Progress towards OT goals: Progressing toward goals  Acute Rehab OT Goals Patient Stated Goal: Daughter wishes to know the best way to move her OT Goal Formulation: With family Time For Goal Achievement: 04/09/20 Potential to Achieve Goals: [place] Discharge plan remains appropriate    Co-evaluation                 AM-PAC OT "6 Clicks" Daily Activity     Outcome Measure   Help from another person eating meals?: A Little Help from another person taking care of personal grooming?: A Little Help  from another person toileting, which includes using toliet, bedpan, or urinal?: Total Help from another person bathing (including washing, rinsing, drying)?: Total Help from another person to put on and taking off regular upper body clothing?: A Little Help from another person to put on and taking off regular lower body clothing?: Total 6 Click Score: 12    End of Session Equipment Utilized During Treatment: Gait belt;Rolling walker  OT Visit Diagnosis: Unsteadiness on feet (R26.81);Other abnormalities of gait and mobility (R26.89);Muscle weakness (generalized) (M62.81);History of falling (Z91.81);Other symptoms and signs involving cognitive function;Pain Pain - Right/Left: Right Pain - part of body: Hip;Leg   Activity Tolerance Patient tolerated treatment well   Patient Left in chair;with call bell/phone within reach;with family/visitor present   Nurse Communication Mobility status        Time: 1230-1300 OT Time Calculation (min): 30 min  Charges: OT General Charges $OT Visit: 1 Visit OT Treatments $Self Care/Home Management : 23-37 mins  03/28/2020  Rich, OTR/L  Acute Rehabilitation Services  Office:  [phone]    Metta Clines 03/28/2020, 1:21 PM

## 2020-03-29 ENCOUNTER — Inpatient Hospital Stay (HOSPITAL_COMMUNITY)

## 2020-03-29 DIAGNOSIS — S72141A Displaced intertrochanteric fracture of right femur, initial encounter for closed fracture: Secondary | ICD-10-CM | POA: Diagnosis not present

## 2020-03-29 LAB — CBC WITH DIFFERENTIAL/PLATELET
Abs Immature Granulocytes: 0.01 10*3/uL (ref 0.00–0.07)
Basophils Absolute: 0 10*3/uL (ref 0.0–0.1)
Basophils Relative: 0 %
Eosinophils Absolute: 0 10*3/uL (ref 0.0–0.5)
Eosinophils Relative: 1 %
HCT: 28.5 % — ABNORMAL LOW (ref 36.0–46.0)
Hemoglobin: 9.3 g/dL — ABNORMAL LOW (ref 12.0–15.0)
Immature Granulocytes: 0 %
Lymphocytes Relative: 19 %
Lymphs Abs: 0.8 10*3/uL (ref 0.7–4.0)
MCH: 32.4 pg (ref 26.0–34.0)
MCHC: 32.6 g/dL (ref 30.0–36.0)
MCV: 99.3 fL (ref 80.0–100.0)
Monocytes Absolute: 0.4 10*3/uL (ref 0.1–1.0)
Monocytes Relative: 11 %
Neutro Abs: 2.7 10*3/uL (ref 1.7–7.7)
Neutrophils Relative %: 69 %
Platelets: 70 10*3/uL — ABNORMAL LOW (ref 150–400)
RBC: 2.87 MIL/uL — ABNORMAL LOW (ref 3.87–5.11)
RDW: 16.3 % — ABNORMAL HIGH (ref 11.5–15.5)
WBC: 3.9 10*3/uL — ABNORMAL LOW (ref 4.0–10.5)
nRBC: 0 % (ref 0.0–0.2)

## 2020-03-29 LAB — BASIC METABOLIC PANEL
Anion gap: 6 (ref 5–15)
BUN: 31 mg/dL — ABNORMAL HIGH (ref 8–23)
CO2: 27 mmol/L (ref 22–32)
Calcium: 8.1 mg/dL — ABNORMAL LOW (ref 8.9–10.3)
Chloride: 99 mmol/L (ref 98–111)
Creatinine, Ser: 1.36 mg/dL — ABNORMAL HIGH (ref 0.44–1.00)
GFR, Estimated: 35 mL/min — ABNORMAL LOW (ref >60)
Glucose, Bld: 110 mg/dL — ABNORMAL HIGH (ref 70–99)
Potassium: 5.1 mmol/L (ref 3.5–5.1)
Sodium: 132 mmol/L — ABNORMAL LOW (ref 135–145)

## 2020-03-29 MED ORDER — TRAZODONE HCL 50 MG PO TABS
50.0000 mg | ORAL_TABLET | Freq: Every day | ORAL | Status: DC
  Administered 2020-03-29 – 2020-03-30 (×2): 50 mg via ORAL
  Filled 2020-03-29 (×2): qty 1

## 2020-03-29 MED ORDER — AZITHROMYCIN 250 MG PO TABS
500.0000 mg | ORAL_TABLET | Freq: Every day | ORAL | Status: AC
  Administered 2020-03-29 – 2020-03-31 (×3): 500 mg via ORAL
  Filled 2020-03-29 (×3): qty 2

## 2020-03-29 NOTE — Progress Notes (Addendum)
Physical Therapy Treatment Patient Details Name: Jordan [name] MRN: [id number] DOB: June 25, 1919 Today's Date: 03/29/2020    History of Present Illness Jordan [name] is a 86 y.o. female presenting with hip pain after fall, found to have a right hip fracture. PMH is significant for Alzheimer's dementia, bronchiectasis, Gout, CKD IV, HFpEF, HTN, and Afib/complete heart block with pacemaker not currently on Anticoagulation. Patient s/p IM nail R hip on 11/23.     PT Comments    Continuing work on functional mobility and activity tolerance;  Session focused on transfers to stand and initiating taking steps with RW; Able to walk approx 5 ft with RW and moderate assist, mostly to keep weight forward due to persistent slight posterior lean throughout;  Pt's son and daughter, [name] and Jerald Kief were able to practice straight sit to stand transfers with Ms. Quattrone as well; Still difficult, but they were able to get her to standing x3 trials before she was completely exhausted (and that was after she had taken a few steps); Then her adult children participated in transfer training, demonstrating basic pivot transfers with overall good technqiue on this therapist;   Pt and her family are making slow but steady progress with acute PT and OT; she is still very appropriate for [facility] therapy follow up; If they decide to pause Hospice Services and pursue rehab therapies at [place], how difficult/easy is it to restart Hospice Services after her rehab course?   Noted that she is a [facility] A[facility] member with Medicare -- it is worth considering the [facility] (which is amped up [facility] services with a rehab focus) to help maximize safety with mobility and ADLs    Follow Up Recommendations  Home health PT;Supervision/Assistance - 24 hour; HH therapies continue to be worth considering to help with transition home; If they decide to pause Hospice Services and pursue rehab therapies at [place], how difficult/easy is it to restart Hospice  Services after her rehab course?     Equipment Recommendations  Rolling walker with 5" wheels;3in1 (PT);Hospital bed (drop-arm 3in1; youth-sized RW; consider ambulance home)    Recommendations for Other Services       Precautions / Restrictions Precautions Precautions: Fall Precaution Comments: decreased skin integrity, compromised R shoulder Restrictions RLE Weight Bearing: Weight bearing as tolerated    Mobility  Bed Mobility Overal bed mobility: Needs Assistance Bed Mobility: Supine to Sit     Supine to sit: Max assist     General bed mobility comments: Cues for using LLE to semi-half-bridge to EOB, with good pt-initiated short scoots; Cues to use both hands at bedrail to initiate semi-roll of trunk to L; Max assist to elevate trunk to sit  Transfers Overall transfer level: Needs assistance Equipment used: Rolling walker (2 wheeled) Transfers: Sit to/from Stand Sit to Stand: Mod assist;+2 physical assistance         General transfer comment: Multimodal cueing to initiate standing with scooting to the edge and anterior weight shifting; Max assist to rise; tending to be in significant hip flexion with posterior lean and needs multimodal cues for hip extension and to forward weight shift (daughter translating "chest up", "belly forward", and "lean forward to push toes into floor" helpful); Once up in chair, had Marilou and [name] assist pt with sit to stand with bilateral support, one sibling on each side; stood from recliner to RW 3x with her son and daughter's help; still with difficulty getting hips extended and weight forward  Ambulation/Gait Ambulation/Gait assistance:  Mod assist;+2 physical assistance (and daughter pushing chair behind her) Gait Distance (Feet): 5 Feet Assistive device: Rolling walker (2 wheeled) Gait Pattern/deviations: Step-to pattern     General Gait Details: basic, simple cues for sequencing: "Walker, Right leg, Left leg"; short steps, initially  with physical assist to advance RLE, but able to step without assist with practice   Stairs             Wheelchair Mobility    Modified Rankin (Stroke Patients Only)       Balance     Sitting balance-Leahy Scale: Fair       Standing balance-Leahy Scale: Poor                              Cognition Arousal/Alertness: Awake/alert Behavior During Therapy: WFL for tasks assessed/performed Overall Cognitive Status: History of cognitive impairments - at baseline                                 General Comments: hx of dementia, able to follow one step commands with increased time, decreased awareness of deficits and safety, difficulty problem solving. Likely at baseline      Exercises      General Comments General comments (skin integrity, edema, etc.): Took time after settling pt in the recliner for transfer practice with Jerald Kief and [name]; I transferred each via basic pivot transfer chair to chair, going in both directions; then asked each of them to transfer myself in the same fashion; Answered questions as well, and they were able to video the demonstration; both Marilou and [name] showed good form and ability to transfer via basic pivot      Pertinent Vitals/Pain Pain Assessment: Faces Faces Pain Scale: Hurts little more Pain Location: R hip with knee flexion and weightbearing Pain Descriptors / Indicators: Grimacing;Guarding Pain Intervention(s): Monitored during session    Home Living                      Prior Function            PT Goals (current goals can now be found in the care plan section) Acute Rehab PT Goals Patient Stated Goal: Daughter wishes to know the best way to move her PT Goal Formulation: With patient/family Time For Goal Achievement: 04/08/20 Potential to Achieve Goals: Fair Progress towards PT goals: Progressing toward goals    Frequency    Min 3X/week      PT Plan Current plan remains  appropriate    Co-evaluation              AM-PAC PT "6 Clicks" Mobility   Outcome Measure  Help needed turning from your back to your side while in a flat bed without using bedrails?: A Lot Help needed moving from lying on your back to sitting on the side of a flat bed without using bedrails?: A Lot Help needed moving to and from a bed to a chair (including a wheelchair)?: A Lot Help needed standing up from a chair using your arms (e.g., wheelchair or bedside chair)?: A Lot Help needed to walk in hospital room?: Total Help needed climbing 3-5 steps with a railing? : Total 6 Click Score: 10    End of Session Equipment Utilized During Treatment: Gait belt Activity Tolerance: Patient tolerated treatment well Patient left: in chair;with call bell/phone within reach;with family/visitor  present Nurse Communication: Mobility status PT Visit Diagnosis: Unsteadiness on feet (R26.81);Muscle weakness (generalized) (M62.81);History of falling (Z91.81);Difficulty in walking, not elsewhere classified (R26.2)     Time: 7076-1518 PT Time Calculation (min) (ACUTE ONLY): 62 min  Charges:  $Therapeutic Activity: 23-37 mins $Self Care/Home Management: [place], PT  Acute Rehabilitation Services Pager [phone] Office [phone]    Colletta [place] 03/29/2020, 5:12 PM

## 2020-03-29 NOTE — Progress Notes (Addendum)
Family Medicine Teaching Service Daily Progress Note Intern Pager: [phone]  [name] Medical record number: [id number] Date of birth: [birth year] Age: 86 y.o. Gender: female  Primary Care Provider: McDiarmid, Blane Ohara, MD Consultants: Ortho Code Status: DNR  Pt Overview and Major Events to Date:  11/22: admitted, underwent ORIF w/intramedullary nail, transfused 1-2u pRBCs intraoperatively 11/23: transfused additional 2u pRBCs  Assessment and Plan:  Jordan Goodson Diazis a 86 y.o.femalewhopresentedwith hip pain after fall,found to have a right hip fracture. PMH is significant forAlzheimer's dementia, bronchiectasis,gout,CKDIV, HFpEF, HTN, andAfib/complete heart block withpacemakernot currently onanticoagulation  Acute Right Comminuted Femur Fracture s/p ORIF 11/22 Stable. Pain is well controlled with Tylenol and Tramadol (has only received 2 doses of Tramadol over past 4 days). Still with oozing blood from surgical site. Mobility improving gradually with PT/OT. -Continue PT/OT -Tylenol solution q4h scheduled -Tramadol 50mg  q6h prn -Bandage changes as needed  Acute on Chronic Macrocytic Anemia Stable. Hgb 9.3 this morning (from 8.8 yesterday).  -Daily CBC -Close monitoring of surgical site  Acute on Chronic Thrombocytopenia Improving. Plt 70 today (from 50 yesterday). -Daily CBC  Cough Daughter reports patient was coughing a lot overnight, which is not typical for her. Requesting a CXR given that she has been laying in bed for several days. -Will obtain portable CXR -Incentive spirometer  AKI on CKD Improving. Cr 1.36 this morning (baseline 1.2). -Daily BMP -Avoid nephrotoxic meds -Encouraged PO hydration  Hospice Care Patient currently under [facility] hospice services.  Will need to decide on home health PT vs with hospice in the next several days for discharge planning, appears to be leaning towards home with hospice and few PT sessions. -Hospice  following  Bronchiectasis: chronic On 2L home O2 as needed. -Continue supplemental O2  A-fib/complete heart block s/p pacemaker insertion Appears in sinus currently.  Pacemaker in place. Not on anticoagulation due to hospice and daughter wishes to continuing holding this even if she goes off of hospice briefly for rehab. -Continue to monitor  HFpEF Chronic, stable. Last echo June 2021 showed EF 60-65% and was otherwise normal. On Lasix 20mg  daily prn for edema, appears euvolemic. -Holding home Lasix -Monitor fluid status  Constipation Resolved. Per daughter, patient with multiple bowel movements yesterday. -Dulcolax suppository PRN  -MiraLAX daily prn  -Consider d/c'ing docusate  Hypothyroidism Chronic, stable. On Synthroid 116mcg at home. -Continue home synthroid  Gout Chronic, stable. On colchicine 0.6mg  BID and allopurinol 100mg  daily at home. -Continue home allopurinol -Colchicine at 0.3mg  daily due to renal function  Chronic back pain Stable. On 50mg  tramadol q6h prn and gabapentin 200mg  BID at home. -Tramadol 50mg  q6h prn -Cont gabapentin 100 mg TID renal dosed   Depression Chronic, stable. On Mirtazapine 50mg  daily and trazodone 50mg  daily at home. -Continue home mirtazapine -Will restart home trazodone   FEN/GI: soft diet PPx: SCDs   Status is: Inpatient Remains inpatient appropriate because:Unsafe d/c plan   Dispo: The patient is from: Home              Anticipated d/c is to: Home              Anticipated d/c date is: 1 day              Patient currently is not medically stable to d/c.    Subjective:  No acute events overnight. Per daughter, patient was coughing overnight which is not typical for her. Otherwise no complaints. Her pain seems to be well controlled.   Objective:  Temp:  [97.7 F (36.5 C)-97.8 F (36.6 C)] 97.8 F (36.6 C) (11/27 2013) Pulse Rate:  [72-74] 74 (11/27 2013) Resp:  [18] 18 (11/27 2013) BP:  (150-152)/(48-49) 150/49 (11/27 2013) SpO2:  [100 %] 100 % (11/27 2013) Physical Exam: General: alert, NAD, resting comfortably in bed Cardiovascular: RRR, normal S1/S2 without m/r/g Respiratory: normal WOB, lungs CTAB Abdomen: +BS, soft, nontender, nondistended Extremities:       Laboratory: Recent Labs  Lab 03/27/20 0236 03/28/20 0654 03/29/20 0034  WBC 4.8 4.2 3.9*  HGB 9.7* 8.8* 9.3*  HCT 29.6* 26.8* 28.5*  PLT 47* 50* 70*   Recent Labs  Lab 03/27/20 0236 03/28/20 0654 03/29/20 0034  NA 132* 133* 132*  K 5.1 4.8 5.1  CL 99 100 99  CO2 25 28 27   BUN 30* 30* 31*  CREATININE 1.57* 1.37* 1.36*  CALCIUM 8.0* 8.0* 8.1*  GLUCOSE 86 86 110*    Imaging/Diagnostic Tests: No new imaging/diagnostic tests   Alcus Dad, MD 03/29/2020, 7:36 AM PGY-1, [place] Intern pager: [phone], text pages welcome

## 2020-03-29 NOTE — Progress Notes (Signed)
Manufacturing engineer [facility]) HospitalHospice Patient  Jordan [name] is a current hospice patient admitted 03/06/20 with a terminal diagnosis of hypertensive heart disease. She lives with her son Jordan [name], and her daughter Jordan [name] cares for her during the day. At baseline she ambulates with a walker and stand by assist and wears supplemental O2 at HS and PRN daytime. Patient fell on 03/22/20 and was transported to [facility] ED where she was found to have a right hip fracture. Hospice was notified prior to transport. Patient was admitted to [facility] with hip fracture s/p fall. Per Dr. Tomasa Hosteller this is a related admission.  Visited patient at bedside along with daughter. Daughter continues to want PT at discharge as long as patient continues to do ok in the hospital and will benefit at discharge. Has some concerns about her mothers mental status but states she is getting back to baseline. She is gaining her appetite back and is eating and drinking. Patient having hard time sleeping so MD started Trazodone back at night.   VS: 97.8 150/49, 79, 18, 100% RA I/O: 300/0?  LABS:  Results for Jordan, [name] (MRN [id number]) as of 03/29/2020 14:14 Sodium: 132 (L) Glucose: 110 (H) BUN: 31 (H) Creatinine: 1.36 (H) Calcium: 8.1 (L) GFR, Estimated: 35 (L) WBC: 3.9 (L) RBC: 2.87 (L) Hemoglobin: 9.3 (L) HCT: 28.5 (L) RDW: 16.3 (H) Platelets: 70 (L)   Problem List: Acute Right Comminuted Femur Fracture s/p ORIF 11/22 Stable. Pain is well controlled with Tylenol and Tramadol (has only received 2 doses of Tramadol over past 4 days). Still with oozing blood from surgical site. Mobility improving gradually with PT/OT. -Continue PT/OT -Tylenol solution q4h scheduled -Tramadol 50mg  q6h prn -Bandage changes as needed  Acute on Chronic Macrocytic Anemia Stable. Hgb 9.3 this morning (from 8.8 yesterday).  -Daily CBC -Close monitoring of surgical site  Acute on Chronic Thrombocytopenia Improving. Plt 70  today (from 50 yesterday). -Daily CBC  Cough Daughter reports patient was coughing a lot overnight, which is not typical for her. Requesting a CXR given that she has been laying in bed for several days. -Will obtain portable CXR -Incentive spirometer  AKI on CKD Improving. Cr 1.36 this morning (baseline 1.2). -Daily BMP -Avoid nephrotoxic meds -Encouraged PO hydration  Hospice Care Patient currently under [facility] hospice services.Will need to decide on home health PT vs with hospice in the next several days for discharge planning,appears to be leaning towards home with hospice and few PT sessions. -Hospice following  Bronchiectasis: chronic On 2L home O2 as needed. -Continue supplemental O2  A-fib/complete heart block s/p pacemaker insertion Appears in sinus currently. Pacemaker in place. Not on anticoagulation due to hospice and daughter wishes to continuing holding this even if she goes off of hospice briefly for rehab. -Continue to monitor  HFpEF Chronic, stable. Last echo June 2021 showed EF 60-65% and was otherwise normal. On Lasix 20mg  daily prn for edema, appears euvolemic. -Holding home Lasix -Monitor fluid status  Constipation Resolved. Per daughter, patient with multiple bowel movements yesterday. -Dulcolax suppositoryPRN -MiraLAXdaily prn -Consider d/c'ing docusate  Hypothyroidism Chronic, stable. On Synthroid 131mcg at home. -Continue home synthroid  Gout Chronic, stable. On colchicine 0.6mg  BID and allopurinol 100mg  daily at home. -Continue home allopurinol -Colchicine at 0.3mg  daily due to renal function  Chronic back pain Stable. On 50mg  tramadol q6h prn and gabapentin 200mg  BID at home. -Tramadol 50mg  q6h prn -Cont gabapentin 100 mg TID renal dosed   Depression Chronic, stable. On Mirtazapine 50mg  daily  and trazodone 50mg  daily at home. -Continue home mirtazapine -Will restart home trazodone   JQD:UKRCV. Palliative  in nature only. Daughter is having a hard time deciding on if she wants to pursue HHPT or continue with hospice services. Family has advised they would need to stop hospice services in order to pursue HHPT (they cannot afford to pay OOP for additional treatments). Currently daughter is wanting to bring patient home with hospice and try a few PT sessions at home to see how she tolerates it but will see how she does with hospital PT for now.  D/C planning:return home, unclear if she will return with hospice or will choose home health PT, leaning more to revoking at D/C. KFM:MCRFVOH team updated Family:updated   [facility] as [facility] active hospice patients.  Clementeen Hoof, BSN, [employer] [phone]

## 2020-03-29 NOTE — Progress Notes (Signed)
Physical Therapy Treatment Patient Details Name: Jordan [name] MRN: [id number] DOB: Aug 06, 1919 Today's Date: 03/29/2020    History of Present Illness Jordan [name] is a 86 y.o. female presenting with hip pain after fall, found to have a right hip fracture. PMH is significant for Alzheimer's dementia, bronchiectasis, Gout, CKD IV, HFpEF, HTN, and Afib/complete heart block with pacemaker not currently on Anticoagulation. Patient s/p IM nail R hip on 11/23.     PT Comments    Continuing work on functional mobility and activity tolerance;  Pt's son Jordan [name] present for session; Performed the same transfer that we had practiced with pt's son and daughter earlier to get an idea of how Jordan [name] responds, and while she was painful during the transition, it di dnot last long, and the pain subsided relatively quickly;   Please see earlier note of this date for discussion and considerations for dc planning   Follow Up Recommendations  Home health PT;Supervision/Assistance - 24 hour HH therapies continue to be worth considering to help with transition home; Consider [facility]; If they decide to pause Hospice Services and pursue rehab therapies at DC, how difficult/easy is it to restart Hospice Services after her rehab course?     Equipment Recommendations  Rolling walker with 5" wheels;3in1 (PT);Hospital bed (drop-arm 3in1; youth-sized RW; consider ambulance home)    Recommendations for Other Services       Precautions / Restrictions Precautions Precautions: Fall Precaution Comments: decreased skin integrity, compromised R shoulder Restrictions RLE Weight Bearing: Weight bearing as tolerated    Mobility  Bed Mobility Overal bed mobility: Needs Assistance Bed Mobility: Sit to Supine     Supine to sit: Max assist Sit to supine: Max assist   General bed mobility comments: Max assist to help shoulders down and LEs back into bed  Transfers Overall transfer level: Needs  assistance Equipment used: 1 person hand held assist Transfers: Stand Pivot Transfers Sit to Stand: Mod assist;+2 physical assistance Stand pivot transfers: Mod assist       General transfer comment: Performed basic stand pivot transfer recliner to bed, pivoting towards her L side to get back to bed; Heavy mod assist to rise and pivot, with close guard of knees for stabiltiy; Painful R hip, but transfer did not last long   Ambulation/Gait Ambulation/Gait assistance: Mod assist;+2 physical assistance (and daughter pushing chair behind her) Gait Distance (Feet): 5 Feet Assistive device: Rolling walker (2 wheeled) Gait Pattern/deviations: Step-to pattern     General Gait Details: basic, simple cues for sequencing: "Walker, Right leg, Left leg"; short steps, initially with physical assist to advance RLE, but able to step without assist with practice   Stairs             Wheelchair Mobility    Modified Rankin (Stroke Patients Only)       Balance     Sitting balance-Leahy Scale: Fair       Standing balance-Leahy Scale: Poor                              Cognition Arousal/Alertness: Awake/alert Behavior During Therapy: WFL for tasks assessed/performed Overall Cognitive Status: History of cognitive impairments - at baseline                                 General Comments: hx of dementia, able to follow one step  commands with increased time, decreased awareness of deficits and safety, difficulty problem solving. Likely at baseline      Exercises      General Comments General comments (skin integrity, edema, etc.): Performed the same type of pivot transfer that we had worked on with her son and daughter      Pertinent Vitals/Pain Pain Assessment: Faces Faces Pain Scale: Hurts little more Pain Location: R hip with knee flexion and weightbearing Pain Descriptors / Indicators: Grimacing;Guarding Pain Intervention(s): Monitored during  session    Home Living                      Prior Function            PT Goals (current goals can now be found in the care plan section) Acute Rehab PT Goals Patient Stated Goal: Daughter wishes to know the best way to move her PT Goal Formulation: With patient/family Time For Goal Achievement: 04/08/20 Potential to Achieve Goals: Fair Progress towards PT goals: Progressing toward goals (slowly)    Frequency    Min 3X/week      PT Plan Current plan remains appropriate    Co-evaluation              AM-PAC PT "6 Clicks" Mobility   Outcome Measure  Help needed turning from your back to your side while in a flat bed without using bedrails?: A Lot Help needed moving from lying on your back to sitting on the side of a flat bed without using bedrails?: A Lot Help needed moving to and from a bed to a chair (including a wheelchair)?: A Lot Help needed standing up from a chair using your arms (e.g., wheelchair or bedside chair)?: A Lot Help needed to walk in hospital room?: Total Help needed climbing 3-5 steps with a railing? : Total 6 Click Score: 10    End of Session Equipment Utilized During Treatment: Gait belt Activity Tolerance: Patient tolerated treatment well Patient left: in bed;with call bell/phone within reach Nurse Communication: Mobility status;Other (comment) (request to check pt's brief) PT Visit Diagnosis: Unsteadiness on feet (R26.81);Muscle weakness (generalized) (M62.81);History of falling (Z91.81);Difficulty in walking, not elsewhere classified (R26.2)     Time: 1458-1510 PT Time Calculation (min) (ACUTE ONLY): 12 min  Charges:  $Therapeutic Activity: 8-22 mins $Self Care/Home Management: [place], PT  Acute Rehabilitation Services Pager [phone] Office [phone]    Jordan [name] 03/29/2020, 6:07 PM

## 2020-03-30 DIAGNOSIS — W19XXXS Unspecified fall, sequela: Secondary | ICD-10-CM | POA: Diagnosis not present

## 2020-03-30 DIAGNOSIS — S72141A Displaced intertrochanteric fracture of right femur, initial encounter for closed fracture: Secondary | ICD-10-CM | POA: Diagnosis not present

## 2020-03-30 LAB — CBC WITH DIFFERENTIAL/PLATELET
Abs Immature Granulocytes: 0.01 10*3/uL (ref 0.00–0.07)
Basophils Absolute: 0 10*3/uL (ref 0.0–0.1)
Basophils Relative: 0 %
Eosinophils Absolute: 0.1 10*3/uL (ref 0.0–0.5)
Eosinophils Relative: 2 %
HCT: 28.1 % — ABNORMAL LOW (ref 36.0–46.0)
Hemoglobin: 8.9 g/dL — ABNORMAL LOW (ref 12.0–15.0)
Immature Granulocytes: 0 %
Lymphocytes Relative: 24 %
Lymphs Abs: 0.9 10*3/uL (ref 0.7–4.0)
MCH: 31.8 pg (ref 26.0–34.0)
MCHC: 31.7 g/dL (ref 30.0–36.0)
MCV: 100.4 fL — ABNORMAL HIGH (ref 80.0–100.0)
Monocytes Absolute: 0.5 10*3/uL (ref 0.1–1.0)
Monocytes Relative: 13 %
Neutro Abs: 2.4 10*3/uL (ref 1.7–7.7)
Neutrophils Relative %: 61 %
Platelets: 82 10*3/uL — ABNORMAL LOW (ref 150–400)
RBC: 2.8 MIL/uL — ABNORMAL LOW (ref 3.87–5.11)
RDW: 16.1 % — ABNORMAL HIGH (ref 11.5–15.5)
WBC: 4 10*3/uL (ref 4.0–10.5)
nRBC: 0 % (ref 0.0–0.2)

## 2020-03-30 LAB — BASIC METABOLIC PANEL
Anion gap: 6 (ref 5–15)
BUN: 32 mg/dL — ABNORMAL HIGH (ref 8–23)
CO2: 26 mmol/L (ref 22–32)
Calcium: 7.8 mg/dL — ABNORMAL LOW (ref 8.9–10.3)
Chloride: 98 mmol/L (ref 98–111)
Creatinine, Ser: 1.25 mg/dL — ABNORMAL HIGH (ref 0.44–1.00)
GFR, Estimated: 39 mL/min — ABNORMAL LOW (ref >60)
Glucose, Bld: 83 mg/dL (ref 70–99)
Potassium: 5.4 mmol/L — ABNORMAL HIGH (ref 3.5–5.1)
Sodium: 130 mmol/L — ABNORMAL LOW (ref 135–145)

## 2020-03-30 MED ORDER — SODIUM ZIRCONIUM CYCLOSILICATE 10 G PO PACK
10.0000 g | PACK | Freq: Once | ORAL | Status: AC
  Administered 2020-03-30: 10 g via ORAL
  Filled 2020-03-30: qty 1

## 2020-03-30 MED ORDER — GUAIFENESIN-DM 100-10 MG/5ML PO SYRP
5.0000 mL | ORAL_SOLUTION | ORAL | Status: DC | PRN
  Administered 2020-03-30: 5 mL via ORAL
  Filled 2020-03-30: qty 5

## 2020-03-30 NOTE — Progress Notes (Signed)
Physical Therapy Treatment Patient Details Name: Jordan [name] MRN: [id number] DOB: 04/05/1920 Today's Date: 03/30/2020    History of Present Illness Jordan [name] is a 86 y.o. female presenting with hip pain after fall, found to have a right hip fracture. PMH is significant for Alzheimer's dementia, bronchiectasis, Gout, CKD IV, HFpEF, HTN, and Afib/complete heart block with pacemaker not currently on Anticoagulation. Patient s/p IM nail R hip on 11/23.     PT Comments    Continuing work on functional mobility and activity tolerance;  Session focused on caregiver training, collaborating with OT to assess and continue working on skills to help pt's daughter and son feel more comfortable taking care of Jordan [name] at home; Performed basic pivot bed to [facility] with Max assist, and then stood for hygeine before pivoting to recliner with 2 person max assist; Provided education to Jordan [name] and Jordan [name] for [employer] during transfers;   Appreciate ongoing conversation with Jordan [name], TOC CM, and Jordan [name], with Jordan [name]; it sounds like HHPT services available through [name] are about equal to [name] services from other agencies, so I'm in favor of simply staying with Hospice   Follow Up Recommendations  Home health PT;Supervision/Assistance - 24 hour     Equipment Recommendations  Rolling walker with 5" wheels;3in1 (PT);Hospital bed (drop-arm 3in1; youth-sized RW; consider ambulance home); Wheelchair, Psychologist, prison and probation services for Other Services       Precautions / Restrictions Precautions Precautions: Fall Precaution Comments: decreased skin integrity, compromised R shoulder Restrictions Weight Bearing Restrictions: Yes RLE Weight Bearing: Weight bearing as tolerated    Mobility  Bed Mobility Overal bed mobility: Needs Assistance Bed Mobility: Supine to Sit     Supine to sit: Max assist;HOB elevated     General bed mobility comments: With instruction from therapists,  daughter demo ability to assist pt to EOB, gentle assist in supporting L LE provided from therapist but daughter able to demo remainder of task  Transfers Overall transfer level: Needs assistance Equipment used: 1 person hand held assist;2 person hand held assist Transfers: Sit to/from [facility] Sit to Stand: Max assist Stand pivot transfers: Max assist;+2 safety/equipment       General transfer comment: Caregiver training performed with transfers during session. Daughter able to demo sit to stand and basic pivot Max A x 1. +2 helpful for safety in assisting to swing pt's hips to surface if having difficulty  advancing to transfer surface. Educated son and daughter on gait belt use, body mechanics and pt/caregiver safety during transitional movements  Ambulation/Gait                 Stairs             Wheelchair Mobility    Modified Rankin (Stroke Patients Only)       Balance Overall balance assessment: Needs assistance Sitting-balance support: Bilateral upper extremity supported;Feet supported Sitting balance-Leahy Scale: Fair Sitting balance - Comments: close supervision for sitting balance, improved from previous sessions Postural control: Posterior lean Standing balance support: Bilateral upper extremity supported;During functional activity Standing balance-Leahy Scale: Poor Standing balance comment: Requires Mod to Max A for balance                            Cognition Arousal/Alertness: Awake/alert Behavior During Therapy: WFL for tasks assessed/performed Overall Cognitive Status: History of cognitive impairments - at baseline  General Comments: hx of dementia, able to follow one step commands with increased time, decreased awareness of deficits and safety, difficulty problem solving. Likely at baseline      Exercises      General Comments General comments (skin integrity,  edema, etc.): Daughter present throughout session; son entering during session. Daughter translated. PT/OT collaborated with Jordan [name] after session for DME needs and functional abilities of pt      Pertinent Vitals/Pain Pain Assessment: Faces Faces Pain Scale: Hurts little more Pain Location: R hip with knee flexion and weightbearing Pain Descriptors / Indicators: Grimacing;Guarding Pain Intervention(s): Monitored during session    Home Living                      Prior Function            PT Goals (current goals can now be found in the care plan section) Acute Rehab PT Goals Patient Stated Goal: Daughter wishes to know the best way to move her PT Goal Formulation: With patient/family Time For Goal Achievement: 04/08/20 Potential to Achieve Goals: Fair Progress towards PT goals: Progressing toward goals (Caregiver goals added)    Frequency    Min 3X/week      PT Plan Current plan remains appropriate    Co-evaluation PT/OT/SLP Co-Evaluation/Treatment: Yes Reason for Co-Treatment: Complexity of the patient's impairments (multi-system involvement);For patient/therapist safety;To address functional/ADL transfers PT goals addressed during session: Mobility/safety with mobility OT goals addressed during session: ADL's and self-care      AM-PAC PT "6 Clicks" Mobility   Outcome Measure  Help needed turning from your back to your side while in a flat bed without using bedrails?: A Lot Help needed moving from lying on your back to sitting on the side of a flat bed without using bedrails?: A Lot Help needed moving to and from a bed to a chair (including a wheelchair)?: A Lot Help needed standing up from a chair using your arms (e.g., wheelchair or bedside chair)?: A Lot Help needed to walk in hospital room?: Total Help needed climbing 3-5 steps with a railing? : Total 6 Click Score: 10    End of Session Equipment Utilized During Treatment: Gait  belt Activity Tolerance: Patient tolerated treatment well Patient left: in chair;with call bell/phone within reach Nurse Communication: Mobility status PT Visit Diagnosis: Unsteadiness on feet (R26.81);Muscle weakness (generalized) (M62.81);History of falling (Z91.81);Difficulty in walking, not elsewhere classified (R26.2)     Time: 7169-6789 PT Time Calculation (min) (ACUTE ONLY): 61 min  Charges:  $Self Care/Home Management: [place], [place] Pager [phone] Office [place] 03/30/2020, 1:22 PM

## 2020-03-30 NOTE — Progress Notes (Signed)
Manufacturing engineer [facility]) [place] Patient   Jordan [name] is a current hospice patient admitted 03/06/20 with a terminal diagnosis of hypertensive heart disease.  She lives with her son Jordan [name], and her daughter Jordan [name] cares for her during the day. At baseline she ambulates with a walker and stand by assist and wears supplemental O2 at HS and PRN daytime.  Patient fell on 03/22/20 and was transported to [facility] ED where she was found to have a right hip fracture.  Hospice was notified prior to transport. Patient was admitted to [facility] with hip fracture s/p fall.  Per Dr. Tomasa Hosteller this is a related admission.    Visited patient at bedside along with daughter and son.  Pt reported generalized, all over body pain, denied SOB.  Pt verbalized repeatedly feeling fatigued, slept intermittently during visit.  Daughter shared concerns over surgical incision still seeping, having many questions about wound care in home.  Son and daughter both shared concern that pt now had PNA.  This RN encouraged them to take their cues from pt, in regards to appetite and desire to rest for healing.  Son and daughter share goal for PT to regain ability to toilet, which would be within hospice plan of care.  Use of [facility] PT discussed as part of overall plan of care after dc.  Family shares today likely desire to continue with hospice care at home.  VS: 97.5 oral, 154/63, 76 HR, 18 RR, 100% 2L I/O: 700/200 Labs:  Na 130, K 5.4, BUN 32, Creat 1.25, Ca 7.8, GFR 39, Hgb 8.9 Diagnostics:  Portable Chest 1 View IMPRESSION: Patchy opacity within the periphery of the left lung base and right mid lung, suspicious for multifocal atypical/viral pneumonia. Possible trace left pleural effusion. Problem List: . Atypical vs viral PNA: azithromycin 500 mg qD x 3 days . Hyperkalemia: lokelma x 1 . Acute on chronic macrocytic anemia . Acute on chronic thrombocytopenia . CKD Stage 3b  D/C planning:  Lots of discussion of benefits of  Home Health vs hospice at home support.  Jordan [name] and Jordan [name], pt's children, leaning toward staying with hospice services after discharge IDT: hospice team updated Family: updated at bedside    Please use [facility] for ambulance transport as this service is contracted for [facility] active hospice patients.   Thank you for the opportunity to participate in this patient's care.     [name], BSN, RN [place] (listed on [place] under Hospice/[facility])    [phone]

## 2020-03-30 NOTE — TOC Progression Note (Signed)
Transition of Care [facility]) - Progression Note    Patient Details  Name: SWATHI DAUPHIN MRN: [id number] Date of Birth: 02/15/1920  Transition of Care [facility]) CM/SW Contact  Jacalyn Lefevre Edson Snowball, RN Phone Number: 03/30/2020, 2:02 PM  Clinical Narrative:     Spoke to patient's daughter Stasia Cavalier at bedside. [name] deciding whether to take her mother back with hospice( already active with [facility]) or change to home health services. Per discussion with [place] their PT would see patient maybe three times the first week then 2 times a week .   [name] asking how many times HHPT would see patient. NCM spoke with [place] with [facility]. Their HHPT would do an assessment once patient returned to home, then they would determine if patient could tolerate three times a week or not. Explained to [facility]. [name] asking about a HHRN. Explained HHrn would not make daily visits for wound care. Family would be taught wound care and [facility] would assess the would for infection etc. [name] asking if hospice would do the same. NCM spoke to [facility] and yes their nurse would do the same. Right now [name] is thinking of continuing with [place] at discharge.   Patient has hospital bed , oxygen and walker at home already. [facility] will order any other required DME hoyer, lift 3 in 1 with drop arm and wheel chair. And transportation home.  Expected Discharge Plan: Home w Hospice Care Barriers to Discharge: Continued Medical Work up  Expected Discharge Plan and Services Expected Discharge Plan: [place] arrangements for the past 2 months: Single Family Home                                       Social Determinants of Health (SDOH) Interventions    Readmission Risk Interventions No flowsheet data found.

## 2020-03-30 NOTE — Progress Notes (Signed)
Occupational Therapy Treatment Patient Details Name: Jordan [name] MRN: [id number] DOB: 07/29/1919 Today's Date: 03/30/2020    History of present illness Jordan [name] is a 86 y.o. female presenting with hip pain after fall, found to have a right hip fracture. PMH is significant for Alzheimer's dementia, bronchiectasis, Gout, CKD IV, HFpEF, HTN, and Afib/complete heart block with pacemaker not currently on Anticoagulation. Patient s/p IM nail R hip on 11/23.    OT comments  Pt making slow, but gradual progress towards OT goals. Collaborated with PT for comprehensive caregiver training for ADLs and transfers to maximize pt/caregiver safety. Assessed family's comfort in physically assisting pt with education provided on use of gait belt, proper body mechanics and safety techniques. Daughter able to demonstrate ability to complete Max A stand pivot to [facility] with pt, +2 assist for toileting hygiene provided by OT (pt declined son to assist with this, but reinforced 2 person assist needed for safety with this toileting method). Encouraged caregivers to assist with bathing and dressing bed level with daughter demonstrating both techniques (+2 assist for brief mgmt in standing). Family appears to be becoming more comfortable in assisting pt with ADLs/transfers. Pt's sitting balance also noted to be much better than last week. Continue to recommend HHOT follow-up and DME noted below to set pt/family up for safety and success at home.  Noted that daughter would like to be able to assist pt in showering tasks. Pt would need a tub bench for this task, but daughter unsure if wheelchair will fit into bathroom. Defer this assessment to [facility] therapist that follows up with this pt.    Follow Up Recommendations  Supervision/Assistance - 24 hour (Family declining SNF; HHOT and 24/7 +2 physical assist)    Equipment Recommendations  Wheelchair (measurements OT);Wheelchair cushion (measurements OT);Hospital bed;Other (comment)  (youth RW, drop arm BSC)    Recommendations for Other Services      Precautions / Restrictions Precautions Precautions: Fall Precaution Comments: decreased skin integrity, compromised R shoulder Restrictions Weight Bearing Restrictions: Yes RLE Weight Bearing: Weight bearing as tolerated       Mobility Bed Mobility Overal bed mobility: Needs Assistance Bed Mobility: Supine to Sit     Supine to sit: Max assist;HOB elevated     General bed mobility comments: With instruction from therapists, daughter demo ability to assist pt to EOB, gentle assist in supporting L LE provided from therapist but daughter able to demo remainder of task  Transfers Overall transfer level: Needs assistance Equipment used: 1 person hand held assist;2 person hand held assist Transfers: Sit to/from [facility] Sit to Stand: Max assist Stand pivot transfers: Max assist;+2 safety/equipment       General transfer comment: Caregiver training performed with transfers during session. Daughter able to demo sit to stand and basic pivot Max A x 1. +2 helpful for safety in assisting to swing pt's hips to surface if having difficulty  advancing to transfer surface. Educated son and daughter on gait belt use, body mechanics and pt/caregiver safety during transitional movements    Balance Overall balance assessment: Needs assistance Sitting-balance support: Bilateral upper extremity supported;Feet supported Sitting balance-Leahy Scale: Fair Sitting balance - Comments: close supervision for sitting balance, improved from previous sessions Postural control: Posterior lean Standing balance support: Bilateral upper extremity supported;During functional activity Standing balance-Leahy Scale: Poor Standing balance comment: Requires Mod to Max A for balance  ADL either performed or assessed with clinical judgement   ADL Overall ADL's : Needs assistance/impaired                      Lower Body Dressing: Bed level;Total assistance Lower Body Dressing Details (indicate cue type and reason): Total A for mgmt of socks, donning of brief requiring +2 physical assist (one for balance, one for clothing mgmt). Recommended dressing/bathing tasks bed level or sitting EOB Toilet Transfer: Maximal assistance;+2 for safety/equipment;+2 for physical assistance;Stand-pivot;BSC Toilet Transfer Details (indicate cue type and reason): Max A x 1 for manual stand pivot to BSC, +2 helpful for safety in guiding hips safely to surface. Assessed daughter and son's comfort/capability with BSC transfer Toileting- Clothing Manipulation and Hygiene: Total assistance;+2 for physical assistance;+2 for safety/equipment;Sit to/from stand Toileting - Clothing Manipulation Details (indicate cue type and reason): Total A x 2 for posterior hygiene. One person to assist in sit to stand (daughter) and one to perform hygiene (therapist) and manage clothing. Educated on transfer to disposable pad on bed to assist with cleaning, use of bedpan or need for 2 person assist for toileting hygiene       General ADL Comments: Extended time spent problem solving with pt's family ADLs, transfers and general safety in caregiving     Vision   Vision Assessment?: No apparent visual deficits   Perception     Praxis      Cognition Arousal/Alertness: Awake/alert Behavior During Therapy: WFL for tasks assessed/performed Overall Cognitive Status: History of cognitive impairments - at baseline                                 General Comments: hx of dementia, able to follow one step commands with increased time, decreased awareness of deficits and safety, difficulty problem solving. Likely at baseline        Exercises     Shoulder Instructions       General Comments Daughter present throughout session; son entering during session. Daughter translated. PT/OT collaborated with  [employer] after session for DME needs and functional abilities of pt. SpO2 99% on RA and with supplemental O2, HR WFL    Pertinent Vitals/ Pain       Pain Assessment: Faces Faces Pain Scale: Hurts little more Pain Location: R hip with knee flexion and weightbearing Pain Descriptors / Indicators: Grimacing;Guarding Pain Intervention(s): Monitored during session;Repositioned;Premedicated before session  Home Living                                          Prior Functioning/Environment              Frequency  Min 2X/week        Progress Toward Goals  OT Goals(current goals can now be found in the care plan section)  Progress towards OT goals: Progressing toward goals  Acute Rehab OT Goals Patient Stated Goal: Daughter wishes to know the best way to move her OT Goal Formulation: With family Time For Goal Achievement: 04/09/20 Potential to Achieve Goals: Fair ADL Goals Pt Will Perform Tub/Shower Transfer: Tub transfer;with mod assist;tub bench;with caregiver independent in assisting Additional ADL Goal #1: Pt/caregiver to demonstrate safe bed mobility at Mod A in preparation for ADL transfers Additional ADL Goal #2: Caregiver to demonstrate safest method for toilet transfers to  prevent caregiver/pt injuryand promote optimal performance for ADLs Additional ADL Goal #3: Caregiver to demonstrate most effective and safest method for LB ADLs to maximize independence, prevent injury Additional ADL Goal #4: Pt/caregiver to verbalize at least 3 methods for maintaining skin integrity  Plan Discharge plan remains appropriate    Co-evaluation    PT/OT/SLP Co-Evaluation/Treatment: Yes Reason for Co-Treatment: Complexity of the patient's impairments (multi-system involvement);To address functional/ADL transfers;For patient/therapist safety   OT goals addressed during session: ADL's and self-care      AM-PAC OT "6 Clicks" Daily Activity     Outcome  Measure   Help from another person eating meals?: A Little Help from another person taking care of personal grooming?: A Little Help from another person toileting, which includes using toliet, bedpan, or urinal?: Total Help from another person bathing (including washing, rinsing, drying)?: Total Help from another person to put on and taking off regular upper body clothing?: A Little Help from another person to put on and taking off regular lower body clothing?: Total 6 Click Score: 12    End of Session Equipment Utilized During Treatment: Gait belt  OT Visit Diagnosis: Unsteadiness on feet (R26.81);Other abnormalities of gait and mobility (R26.89);Muscle weakness (generalized) (M62.81);History of falling (Z91.81);Other symptoms and signs involving cognitive function;Pain Pain - Right/Left: Right Pain - part of body: Hip;Leg   Activity Tolerance Patient tolerated treatment well   Patient Left in chair;with call bell/phone within reach;with family/visitor present   Nurse Communication Mobility status;Other (comment) (drainage from bandage)        Time: 9201-0071 OT Time Calculation (min): 61 min  Charges: OT General Charges $OT Visit: 1 Visit OT Treatments $Self Care/Home Management : 23-37 mins  Layla Maw, OTR/L   Layla Maw 03/30/2020, 12:28 PM

## 2020-03-30 NOTE — Care Management Important Message (Signed)
Important Message  Patient Details  Name: Jordan [name] MRN: [id number] Date of Birth: [birth year]   Medicare Important Message Given:  Yes     Race Latour P [place] 03/30/2020, 3:37 PM

## 2020-03-30 NOTE — Progress Notes (Signed)
Family Medicine Teaching Service Daily Progress Note Intern Pager: [phone]  [name] Medical record number: [id number] Date of birth: Mar 09, 1920 Age: 86 y.o. Gender: female  Primary Care Provider: McDiarmid, Blane Ohara, MD Consultants: Ortho Code Status: DNR  Pt Overview and Major Events to Date:  11/22: admitted, underwent ORIF w/intramedullary nail, transfused 1-2u pRBCs intraoperatively 11/23: transfused additional 2u pRBCs  Assessment and Plan:  Jordan [name] a 86 y.o.femalewhopresentedwith hip pain after fall,found to have a right hip fracture. PMH is significant forAlzheimer's dementia, bronchiectasis,gout,CKDIV, HFpEF, HTN, andAfib/complete heart block withpacemakernot currently onanticoagulation  Acute Right Comminuted Femur Fracture s/p ORIF 11/22 Stable. Pain is well controlled with Tylenol and Tramadol. Mobility improving gradually with PT/OT. -Continue PT/OT -Tylenol solution q4h scheduled -Tramadol 50mg  q6h prn -Bandage changes as needed  Atypical vs. Viral Pneumonia CXR yesterday revealed bilateral patchy opacities suspicious for atypical/viral pneumonia, -Azithromycin 500mg  daily x3 days -Incentive spirometer  Hyperkalemia K 5.4 this morning. -Plan for Lokelma x1  Acute on Chronic Macrocytic Anemia Stable. Hgb 8.9 this morning. -Daily CBC -Close monitoring of surgical site  Acute on Chronic Thrombocytopenia Improving. Plt 82 today (from 70 yesterday). -Daily CBC  CKD Stage 3b Stable. Cr 1.25 this morning, GFR 39 (baseline Cr ~1.2). -Daily BMP -Avoid nephrotoxic meds -Encouraged PO hydration  Hospice Care Patient currently under [facility] hospice services.Daughter has decided they'd like to pause hospice and go home with home health/PT -Hospice following -Will discuss with social work to arrange home health PT  Bronchiectasis: chronic On 2L home O2 as needed. -Continue supplemental O2  A-fib/complete heart block s/p  pacemaker insertion Stable. Remains in sinus rhythm, pacemaker in place. Not on anticoagulation due to hospice and daughter wishes to continuing holding this even if she goes off of hospice briefly for rehab. -Continue to monitor  HFpEF Chronic, stable. Last echo June 2021 showed EF 60-65% and was otherwise normal. On Lasix 20mg  daily prn for edema, appears euvolemic. -Holding home Lasix -Monitor fluid status  Constipation Resolved. -Dulcolax suppository PRN  -MiraLAX daily prn  -Consider d/c'ing docusate  Hypothyroidism Chronic, stable. On Synthroid 110mcg at home. -Continue home synthroid  Gout Chronic, stable. On colchicine 0.6mg  BID and allopurinol 100mg  daily at home. -Continue home allopurinol -Colchicine at 0.3mg  daily due to renal function  Chronic back pain Stable. On 50mg  tramadol q6h prn and gabapentin 200mg  BID at home. -Tramadol 50mg  q6h prn -Cont gabapentin 100 mg TID renal dosed   Depression Chronic, stable. On Mirtazapine 50mg  daily and trazodone 50mg  daily at home. -Continue home mirtazapine -Continue home trazodone   FEN/GI: soft diet PPx: SCDs   Status is: Inpatient Remains inpatient appropriate because:Unsafe d/c plan   Dispo: The patient is from: Home              Anticipated d/c is to: Home              Anticipated d/c date is: 1 day              Patient currently is not medically stable to d/c.    Subjective:  No acute events overnight. Daughter has decided they'd like to pause hospice upon discharge and pursue home health PT.  Objective: Temp:  [97.5 F (36.4 C)-97.6 F (36.4 C)] 97.5 F (36.4 C) (11/28 2108) Pulse Rate:  [70-76] 76 (11/28 2108) Resp:  [18] 18 (11/28 2108) BP: (154-167)/(61-63) 154/63 (11/28 2108) SpO2:  [100 %] 100 % (11/28 2108) Physical Exam: General: alert, NAD, resting comfortably in  bed Cardiovascular: RRR, normal S1/S2 without m/r/g Respiratory: normal WOB, bibasilar crackles Abdomen: +BS,  soft, nontender, nondistended Extremities: no peripheral edema. Ecchymosis surrounding surgical site of R hip, dressing in place with dried blood visible   Laboratory: Recent Labs  Lab 03/28/20 0654 03/29/20 0034 03/30/20 0230  WBC 4.2 3.9* 4.0  HGB 8.8* 9.3* 8.9*  HCT 26.8* 28.5* 28.1*  PLT 50* 70* 82*   Recent Labs  Lab 03/28/20 0654 03/29/20 0034 03/30/20 0230  NA 133* 132* 130*  K 4.8 5.1 5.4*  CL 100 99 98  CO2 28 27 26   BUN 30* 31* 32*  CREATININE 1.37* 1.36* 1.25*  CALCIUM 8.0* 8.1* 7.8*  GLUCOSE 86 110* 83    Imaging/Diagnostic Tests: No new imaging/diagnostic tests   Alcus Dad, MD 03/30/2020, 9:53 AM PGY-1, [place] Intern pager: [phone], text pages welcome

## 2020-03-31 DIAGNOSIS — R059 Cough, unspecified: Secondary | ICD-10-CM

## 2020-03-31 LAB — CBC
HCT: 24.8 % — ABNORMAL LOW (ref 36.0–46.0)
HCT: 27.8 % — ABNORMAL LOW (ref 36.0–46.0)
Hemoglobin: 8.1 g/dL — ABNORMAL LOW (ref 12.0–15.0)
Hemoglobin: 9 g/dL — ABNORMAL LOW (ref 12.0–15.0)
MCH: 32.4 pg (ref 26.0–34.0)
MCH: 32.4 pg (ref 26.0–34.0)
MCHC: 32.7 g/dL (ref 30.0–36.0)
MCHC: 32.4 g/dL (ref 30.0–36.0)
MCV: 99.2 fL (ref 80.0–100.0)
MCV: 100 fL (ref 80.0–100.0)
Platelets: 71 10*3/uL — ABNORMAL LOW (ref 150–400)
Platelets: 91 10*3/uL — ABNORMAL LOW (ref 150–400)
RBC: 2.5 MIL/uL — ABNORMAL LOW (ref 3.87–5.11)
RBC: 2.78 MIL/uL — ABNORMAL LOW (ref 3.87–5.11)
RDW: 16 % — ABNORMAL HIGH (ref 11.5–15.5)
RDW: 16.2 % — ABNORMAL HIGH (ref 11.5–15.5)
WBC: 3.4 10*3/uL — ABNORMAL LOW (ref 4.0–10.5)
WBC: 4.6 10*3/uL (ref 4.0–10.5)
nRBC: 0 % (ref 0.0–0.2)
nRBC: 0 % (ref 0.0–0.2)

## 2020-03-31 LAB — BASIC METABOLIC PANEL
Anion gap: 3 — ABNORMAL LOW (ref 5–15)
BUN: 32 mg/dL — ABNORMAL HIGH (ref 8–23)
CO2: 26 mmol/L (ref 22–32)
Calcium: 7.6 mg/dL — ABNORMAL LOW (ref 8.9–10.3)
Chloride: 98 mmol/L (ref 98–111)
Creatinine, Ser: 1.18 mg/dL — ABNORMAL HIGH (ref 0.44–1.00)
GFR, Estimated: 42 mL/min — ABNORMAL LOW (ref >60)
Glucose, Bld: 81 mg/dL (ref 70–99)
Potassium: 4.4 mmol/L (ref 3.5–5.1)
Sodium: 127 mmol/L — ABNORMAL LOW (ref 135–145)

## 2020-03-31 MED ORDER — COLCHICINE 0.6 MG PO TABS: 0.3000 mg | ORAL_TABLET | Freq: Every day | ORAL | 0 refills | Status: AC

## 2020-03-31 MED ORDER — GABAPENTIN 100 MG PO CAPS: 100.0000 mg | ORAL_CAPSULE | Freq: Two times a day (BID) | ORAL | 0 refills | Status: AC | PRN

## 2020-03-31 MED ORDER — ACETAMINOPHEN 160 MG/5ML PO SOLN: 325.0000 mg | ORAL | 0 refills | Status: AC

## 2020-03-31 MED ORDER — HYDROCODONE-ACETAMINOPHEN 5-325 MG PO TABS: 1.0000 | ORAL_TABLET | Freq: Two times a day (BID) | ORAL | 0 refills | Status: AC

## 2020-03-31 MED ORDER — ALLOPURINOL 100 MG PO TABS: ORAL_TABLET | ORAL | 3 refills | Status: AC

## 2020-03-31 NOTE — Discharge Summary (Signed)
[facility] Discharge Summary  [name] Medical record number: [id number] Date of birth: 1920/01/28 Age: 86 y.o. Gender: female Date of Admission: 03/22/2020  Date of Discharge: 03/31/2020 Admitting Physician: Delora Fuel, MD  Primary Care Provider: McDiarmid, Blane Ohara, MD Consultants: Ortho  Indication for Hospitalization: R hip fracture  Discharge Diagnoses/Problem List:  R comminuted femur fracture s/p ORIF Atypical Pneumonia Anemia Thrombocytopenia Dementia Bronchiectasis Gout CKD Stage IV HFpEF HTN A-Fib, Complete Heart Block  Disposition: Home with hospice  Discharge Condition: stable  Discharge Exam:  General: alert, non-toxic elderly female resting comfortably in bed Cardiovascular: RRR, normal S1/S2 without m/r/g Respiratory: normal WOB, mild crackles at L lung base, lungs otherwise clear Abdomen: soft, nontender, nondistended Extremities: significant ecchymosis on R lateral hip and R flank/back. Bandage with dried blood over surgical site. Trace nonpitting edema of R ankle.  Brief Hospital Course:   MAZI SCHUFF is a 86 y.o. female who presented with hip pain after fall, found to have a right hip fracture. PMH is significant for Alzheimer's dementia, bronchiectasis, gout, CKD IV, HFpEF, HTN, and Afib/complete heart block with pacemaker not currently on anticoagulation.   Acute Right Comminuted Femur Fracture s/p ORIF 11/22 Patient presented with R hip pain after fall out of bed. X-ray showed acute comminuted intertrochanteric fracture of the right proximal femur with varus angulation of the fracture fragments. She underwent ORIF with intramedullary nailing on 11/22 and tolerated the procedure well. Patient's post-op pain was adequately controlled with Tylenol and Tramadol. Orthopedic surgery followed the patient and PT and OT worked with her on several occasions.  Anemia  Thrombocytopenia Patient's hemoglobin 7.3 on  admission with platelets 94. She was transfused 1-2u pRBCs intraoperatively. Despite this, her hemoglobin was 6.8 the following morning. She received an additional 2u pRBCs on 11/22 which brought her hemoglobin to 10.9. Hemoglobin stable at 9.0 on the day of discharge. Her platelets remained low, but stable at 91 on day of discharge.   Hospice Care Patient currently under [facility] hospice services and this was a hospice-related admission. The Hospice Liaison spoke with the patient and her family daily.  The remainder of patient's chronic medical problems remained stable and she was maintained on her home medications.   Issues for Follow Up:  1. Patient sent home with Foley catheter for comfort 2. Patient's colchicine and gabapentin dose decreased due to renal function  Significant Procedures: ORIF with intramedullary nail 11/22  Significant Labs and Imaging:  Recent Labs  Lab 03/30/20 0230 03/31/20 0242 03/31/20 1354  WBC 4.0 3.4* 4.6  HGB 8.9* 8.1* 9.0*  HCT 28.1* 24.8* 27.8*  PLT 82* 71* 91*   Recent Labs  Lab 03/27/20 0236 03/27/20 0236 03/28/20 0654 03/28/20 0654 03/29/20 0034 03/29/20 0034 03/30/20 0230 03/31/20 0242  NA 132*  --  133*  --  132*  --  130* 127*  K 5.1   < > 4.8   < > 5.1   < > 5.4* 4.4  CL 99  --  100  --  99  --  98 98  CO2 25  --  28  --  27  --  26 26  GLUCOSE 86  --  86  --  110*  --  83 81  BUN 30*  --  30*  --  31*  --  32* 32*  CREATININE 1.57*  --  1.37*  --  1.36*  --  1.25* 1.18*  CALCIUM 8.0*  --  8.0*  --  8.1*  --  7.8* 7.6*   < > = values in this interval not displayed.     Results/Tests Pending at Time of Discharge: None  Discharge Medications:  Allergies as of 03/31/2020      Reactions   Chlorthalidone Other (See Comments)   Hyponatremia   Honey Bee Treatment [bee Venom] Swelling, Rash   Verapamil Other (See Comments)   Bradycardia - HR  35-45 on 40mg  TID   Adhesive [tape] Rash, Other (See Comments)   Skin irritation    Amlodipine Other (See Comments)   Peripheral edema with 2.5mg  daily dose      Medication List    STOP taking these medications   acetaminophen 650 MG CR tablet Commonly known as: TYLENOL Replaced by: acetaminophen 160 MG/5ML solution   Eliquis 2.5 MG Tabs tablet Generic drug: apixaban   furosemide 20 MG tablet Commonly known as: LASIX     TAKE these medications   acetaminophen 160 MG/5ML solution Commonly known as: TYLENOL Take 10.2 mLs (325 mg total) by mouth every 4 (four) hours. Replaces: acetaminophen 650 MG CR tablet   Allevyn Life Sacrum Pads Apply 1 Units topically as needed.   allopurinol 100 MG tablet Commonly known as: ZYLOPRIM TAKE 1 TABLET(100 MG) BY MOUTH DAILY What changed: See the new instructions.   colchicine 0.6 MG tablet Take 0.5 tablets (0.3 mg total) by mouth daily. What changed:   how much to take  when to take this   cycloSPORINE 0.05 % ophthalmic emulsion Commonly known as: RESTASIS Place 1 drop into both eyes at bedtime.   diclofenac Sodium 1 % Gel Commonly known as: VOLTAREN Apply 2 g topically 4 (four) times daily.   docusate sodium 100 MG capsule Commonly known as: COLACE Take 2 capsules (200 mg total) by mouth 2 (two) times daily as needed for mild constipation.   feeding supplement (NEPRO CARB STEADY) Liqd Take 237 mLs by mouth 2 (two) times daily between meals.   fesoterodine 4 MG Tb24 tablet Commonly known as: Toviaz Take 1 tablet (4 mg total) by mouth daily.   gabapentin 100 MG capsule Commonly known as: NEURONTIN Take 1 capsule (100 mg total) by mouth 2 (two) times daily as needed. What changed:   how much to take  when to take this  reasons to take this   levothyroxine 100 MCG tablet Commonly known as: SYNTHROID TAKE 1 TABLET BY MOUTH ONCE DAILY What changed: when to take this   lidocaine 5 % ointment Commonly known as: XYLOCAINE APPLY 1 APPLICATION TOPICALLY DAILY AS NEEDED FOR BACK PAIN What changed:  See the new instructions.   mirtazapine 15 MG tablet Commonly known as: REMERON Take 1 tablet (15 mg total) by mouth at bedtime.   omeprazole 20 MG capsule Commonly known as: PRILOSEC TAKE 1 CAPSULE(20 MG) BY MOUTH DAILY What changed: See the new instructions.   ondansetron 4 MG disintegrating tablet Commonly known as: Zofran ODT Take 1 tablet (4 mg total) by mouth every 8 (eight) hours as needed for nausea or vomiting.   polyethylene glycol 17 g packet Commonly known as: MIRALAX / GLYCOLAX Take 17 g by mouth daily as needed for moderate constipation. take 17 grams by mouth once daily   traMADol 50 MG tablet Commonly known as: ULTRAM Take 1 tablet (50 mg total) by mouth every 6 (six) hours as needed for moderate pain or severe pain.   Travatan Z 0.004 % Soln ophthalmic solution Generic drug: Travoprost (BAK  Free) Place 1 drop into both eyes at bedtime.   traZODone 50 MG tablet Commonly known as: DESYREL TAKE 1 TABLET BY MOUTH AT BEDTIME            Durable Medical Equipment  (From admission, onward)         Start     Ordered   03/28/20 0737  For home use only DME Other see comment  Once       Comments: Hoyer lift per PT recommendations  Question:  Length of Need  Answer:  6 Months   03/28/20 0736   03/28/20 0705  For home use only DME wheelchair cushion (seat and back)  Once        03/28/20 0704   03/28/20 0703  For home use only DME 3 n 1  Once       Comments: drop-arm 3in1 per OT/PT   03/28/20 0704           Discharge Care Instructions  (From admission, onward)         Start     Ordered   03/24/20 0000  Partial weight bearing       Question Answer Comment  % Body Weight 50   Laterality right   Extremity Lower      03/24/20 1222          Discharge Instructions: Please refer to Patient Instructions section of EMR for full details.  Patient was counseled important signs and symptoms that should prompt return to medical care, changes in  medications, dietary instructions, activity restrictions, and follow up appointments.   Follow-Up Appointments:  Follow-up Information    Paralee Cancel, MD. Schedule an appointment as soon as possible for a visit in 2 weeks.   Specialty: Orthopedic Surgery Contact information: [address] [place] [address] [phone]               Alcus Dad, MD 03/31/2020, 5:15 PM PGY-1, [place]

## 2020-03-31 NOTE — Progress Notes (Signed)
Patient's family updated on the [facility] transport that they cannot provide a timeline of their arrival but will pick up patient as soon as transport is available.  Foley Catheter was already placed on patient per discharge order.  Will keep family updated of transport progress.

## 2020-03-31 NOTE — Discharge Instructions (Signed)
Fractura diafisaria femoral Femoral Shaft Fracture  La fractura diafisaria femoral es la ruptura de la parte larga y recta (difisis) del hueso del muslo (fmur). Esta afeccin se trata casi siempre con [place]. Cules son las causas? Esta afeccin puede producirse por un impacto violento, por ejemplo:  Una cada, especialmente de una gran altura.  Una lesin deportiva.  Un accidente de automvil o motocicleta. Qu incrementa el riesgo? Es ms probable que Orthoptist en las personas que:  Son de [place]. El riesgo aumenta con la edad.  Tienen determinadas enfermedades que [employer] se debiliten y Counsellor, como osteoporosis.  Toman medicamentos para la osteoporosis.  Practican deportes de alto riesgo o alto impacto. Cules son los signos o sntomas? Los sntomas de esta afeccin incluyen:  Dolor intenso.  Imposibilidad de caminar.  Moretones.  Hinchazn.  El muslo tiene una forma rara (deformidad). Si la fractura rompi la piel (fractura expuesta), tambin puede haber sangrado. Cmo se diagnostica? Esta afeccin se diagnostica en funcin de lo siguiente:  Los sntomas y los antecedentes mdicos.  Un examen fsico.  Radiografas. Cmo se trata? Generalmente, esta afeccin se trata con uno o ms de los siguientes procedimientos:  Cir[place] para fijar los fragmentos de Praxair en su lugar con clavos que se conectan a una barra estabilizadora fuera de la piel (fijacin externa).  Ciruga para insertar una varilla y tornillos en el fmur (fijacin intramedular). Si se le realiz una fijacin externa, tambin puede ser necesario realizar una fijacin intramedular unas semanas despus.  Ciruga para colocar placas de metal en el fmur para que se Firefighter. Esto puede realizarse si la fractura est prxima a un extremo del fmur. En casos poco frecuentes, la ciruga puede no ser [employer]. En ese caso, se colocar un yeso o  una frula en la pierna para mantenerla en su lugar mientras se recupera el hueso (inmovilizacin). El tratamiento tambin puede incluir lo siguiente:  No colocar peso sobre la pierna mientras se recupera (restricciones para soportar peso).  El [place] de un dispositivo para ayudar con la movilidad (dispositivo de asistencia), como muletas o una silla de ruedas.  Fisioterapia. Siga estas indicaciones en su casa: Si tiene un yeso:  No introduzca nada dentro del yeso para rascarse la piel. Esto puede aumentar el riesgo de contraer una infeccin.  [place] piel de alrededor del yeso. Informe al mdico acerca de cualquier inquietud.  Puede aplicar una locin en la piel seca alrededor de los bordes del yeso. No aplique locin en la piel por debajo del yeso.  Mantenga el yeso limpio.  Si el yeso no es impermeable: ? No deje que se moje. ? Cbralo con un envoltorio hermtico cuando tome un bao de inmersin o [place]. Si tiene una frula:  Use la frula como se lo haya indicado el mdico. Qutesela solamente como se lo haya indicado el mdico.  Afloje la frula si los dedos de los pies se le entumecen, siente hormigueos o se le enfran y se tornan de Optician, dispensing.  Mantenga la frula limpia.  Si tiene una frula que no es impermeable: ? No deje que se moje. ? Cbrala con un envoltorio hermtico cuando tome un bao de inmersin o [place]. Actividad  No apoye el peso del cuerpo sobre la pierna hasta que el mdico lo autorice. Respete las restricciones para soportar peso.  Use muletas, una silla de ruedas u otros dispositivos de asistencia  como se le indique.  Pregntele al mdico qu actividades son seguras para usted mientras se Writer, y qu actividades Child psychotherapist.  Haga los ejercicios de fisioterapia como se lo hayan indicado. Medicamentos  [employer] de venta libre y los recetados solamente como se lo haya indicado el mdico.  Pregntele al  mdico si debe tomar suplementos de calcio y vitaminasC yD para promover la consolidacin del [place]. Control del dolor, del entumecimiento y de la hinchazn   Si se lo indican, aplique hielo [employer] zonas doloridas: ? Si tiene una frula desmontable, qutesela como se lo haya indicado el mdico. ? Ponga el hielo en una bolsa plstica. ? Coloque una [employer] piel y [place] yeso y Therapist, nutritional. ? Coloque el hielo durante 46minutos, 2 a 3veces por da.  Mueva los dedos del pie con frecuencia para evitar que se entumezcan y para reducir la hinchazn.  Levante (eleve) la parte inferior de la pierna por encima del nivel del corazn mientras est sentado o acostado, siempre que sea posible. Indicaciones generales  No ejerza presin en ninguna parte del yeso o la frula hasta que se haya endurecido por completo, si corresponde. Esto puede tardar varias horas.  No conduzca hasta que el mdico lo autorice. No debe conducir ni usar maquinaria pesada mientras toma analgsicos recetados.  No consuma ningn producto que contenga nicotina o tabaco, como cigarrillos y Psychologist, sport and exercise. Estos pueden retrasar la consolidacin del [place]. Si necesita ayuda para dejar de consumir, consulte al mdico.  No tome baos de inmersin, no nade ni use el jacuzzi hasta que el mdico lo autorice. Pregntele al mdico si puede ducharse. Thurston Pounds solo le permitan darse baos de [place].  Concurra a todas las visitas de control como se lo haya indicado el mdico. Esto es importante. Comunquese con un mdico si tiene:  Un dolor que empeora o que no mejora con los medicamentos.  Tiene enrojecimiento o hinchazn que empeoran.  Cristy Hilts. Solicite ayuda inmediatamente si tiene:  Cualquiera de los siguientes sntomas en los dedos de los pies o los pies, incluso despus de aflojar la frula: ? Piel fra. ? Piel azulada. ? Entumecimiento. ? Hormigueo.  Dolor intenso.  Dolor que empeora  cuando [employer] dedos del pie.  Dolor en el pecho.  Falta de aire. Resumen  La fractura diafisaria femoral es la ruptura de la parte larga y recta (difisis) del hueso del muslo (fmur). Esto se trata casi siempre con [place].  Esta afeccin puede producirse por un impacto violento.  No apoye el peso del cuerpo NIKE pierna hasta que el mdico lo autorice. Siga las restricciones de soporte de peso segn se lo indiquen.  Concurra a todas las visitas de control como se lo haya indicado el mdico. Esto es importante. Esta informacin no tiene Marine scientist el consejo del mdico. Asegrese de hacerle al mdico cualquier pregunta que tenga. Document Revised: 07/29/2017 Document Reviewed: 07/29/2017 Elsevier Patient Education  [place].

## 2020-03-31 NOTE — Consult Note (Signed)
   [facility] CM Inpatient Consult   03/31/2020  Jordan [name] Oct 12, 1919 [id number]  [place] [facility] [ACO] Patient: Medicare NextGen   Patient screened for high risk score for unplanned readmission score and for hospitalizations to check if potential [place] Management service needs.  Review of patient's medical record reveals patient is active with Manufacturing engineer for hospice. Patient is in the [place] practice.  Plan: Needs for post hospital follow up is with hospice care. Will update Embedded RNCM of disposition.  For questions contact:   Natividad Brood, RN BSN [facility] Liaison  [phone] business mobile phone Toll free office [phone]  Fax number: [phone] [email or web address]

## 2020-03-31 NOTE — Progress Notes (Addendum)
Manufacturing engineer [facility] HospitalHospice Patient  Jordan [name] is a current hospice patient admitted 03/06/20 with a terminal diagnosis of hypertensive heart disease. She lives with her son [name], and her daughter [name] cares for her during the day. At baseline she ambulates with a walker and stand by assist and wears supplemental O2 at HS and PRN daytime. Patient fell on 03/22/20 and was transported to [facility] ED where she was found to have a right hip fracture. Hospice was notified prior to transport. Patient was admitted to [facility] with hip fracture s/p fall. Per Dr. Tomasa Hosteller this is a related admission.  Visited patient at bedside along with daughter and son.  Pt reported generalized, all over body pain, denied SOB.  Pt verbalized to daughter and son feelings of  fatigued, slept intermittently during visit.  Daughter shared concerns over surgical incision still seeping, having many questions about wound care in home.  Son and daughter both shared concern that pt now had PNA.  This RN encouraged them to take their cues from pt, in regards to appetite and desire to rest for healing.  Son and daughter share goal for PT to regain ability to toilet, which would be within hospice plan of care.  Use of [facility] PT discussed as part of overall plan of care after dc.  Family shares today likely desire to continue with hospice care at home with hopes to discharge today if hemoglobin is stable. Would like repeat labs done along with visit from orthopedic surgeon prior to discharge.   If at all possible if family is willing can we place foley catheter at discharge. Purewick is not covered by [facility].   VS:98.1 oral, 130/47, 67 HR, 18 RR, 100% 2L [place] I/O:120/300 Labs:as of 03/31/2020 0242 Sodium: 127 (L) BUN: 32 (H) Creatinine: 1.18 (H) Calcium: 7.6 (L) Anion gap: 3 (L) GFR, Estimated: 42 (L) WBC: 3.4 (L) RBC: 2.50 (L) Hemoglobin: 8.1 (L) HCT: 24.8 (L) RDW: 16.0 (H) Platelets: 71  (L)   Diagnostics:  Portable Chest 1 View IMPRESSION: Patchy opacity within the periphery of the left lung base and right mid lung, suspicious for multifocal atypical/viral pneumonia. Possible trace left pleural effusion. Problem List:  Atypical vs viral PNA: azithromycin 500 mg qD x 3 days  Hyperkalemia: lokelma x 1  Acute on chronic macrocytic anemia  Acute on chronic thrombocytopenia  CKD Stage 3b  D/C planning: Lots of discussion of benefits of Home Health vs hospice at home support.  [name] and [name], pt's children, leaning toward staying with hospice services after discharge NIO:EVOJJKK team updated Family:updated at bedside  [facility] as [facility] for [facility] active hospice patients.  Thank you for the opportunity to participate in this patient's care.  Clementeen Hoof, BSN, RN [place] (listed on [place] under Hospice/[facility]) [phone]

## 2020-03-31 NOTE — Progress Notes (Signed)
Saw Jordan [name] this evening, and spoke with her family at the bedside. We removed her mepelex dressings. I reapplied dermabond to her distal incisions, and covered with two aquacels. These may remain in place until follow up unless they become saturated.   Her daughter does wish to have a small prescription for hydrocodone in case of increased pain at home, as she is reporting significant pain in the hip this evening.  I do not mind sending this if the primary team is comfortable with it. I do recommend she primarily rely on tramadol and tylenol for pain relief. We discussed the potential for these medications to cause cognitive side effects, and she will call our office with any concerns.   Griffith Citron, PA-C Orthopedic Surgery [facility] [facility] [phone]

## 2020-03-31 NOTE — Progress Notes (Signed)
PT Cancellation Note  Patient Details Name: Jordan [name] MRN: [id number] DOB: 12/13/1919   Cancelled Treatment:    Reason Eval/Treat Not Completed: Other (comment)   Checked in with pt and family to see about answering any questions, or if they want to work on any goals today;  Pt sleeping soundly, and opted not to wake her;   Noted plan is for dc home today, and acute PT is in agreement;   Noted also that they will receive HHPT follow up through [facility];   Will continue to follow while inpatient;   Thank you,  Roney Marion, PT  Acute Rehabilitation Services Pager [phone] Office [phone]     Colletta [place] 03/31/2020, 12:15 PM

## 2020-03-31 NOTE — Progress Notes (Signed)
Patient discharged to home via [place] transport together with family members.  Discharge instructions, script and packet given to patient's daughter and patient daughter verbalized understanding.

## 2020-03-31 NOTE — Progress Notes (Signed)
Family Medicine Teaching Service Daily Progress Note Intern Pager: [phone]  [name] Medical record number: [id number] Date of birth: 12-26-1919 Age: 86 y.o. Gender: female  Primary Care Provider: McDiarmid, Blane Ohara, MD Consultants: Ortho (s/o), Hospice Code Status: DNR  Pt Overview and Major Events to Date:  11/22: admitted, underwent ORIF w/intramedullary nail, transfused 1-2u pRBCs intraoperatively 11/23: transfused additional 2u pRBCs 11/28: started Azithromycin  Assessment and Plan:  Jordan [name] is a 86 y.o. female who presented with hip pain after fall, found to have a right hip fracture. PMH is significant for Alzheimer's dementia, bronchiectasis, gout, CKD IV, HFpEF, HTN, and Afib/complete heart block with pacemaker not currently on anticoagulation   Acute Right Comminuted Femur Fracture s/p ORIF 11/22 Stable, improving gradually with PT/OT. Pain is well controlled at rest with Tylenol and Tramadol although PT and transfers are still quite painful. Still oozing blood from the surgical site with significant surrounding ecchymosis. -Continue PT/OT -Tylenol solution q4h scheduled -Tramadol 50mg  q6h prn -Will contact ortho re: surgical site  Hospice Care Patient currently under [facility] following -Patient's daughter has decided to continue home hospice upon discharge   Atypical vs. Viral Pneumonia CXR on 11/28 revealed bilateral patchy opacities suspicious for atypical/viral pneumonia. Patient remains stable on her home oxygen (2L as needed) without fever or leukocytosis. -Azithromycin 500mg  daily x3 days (11/28-11/30) -Incentive spirometer   Acute on Chronic Macrocytic Anemia Hgb 8.1 this morning (from 8.9 yesterday). Still oozing blood from surgical site. Daughter is concerned her hemoglobin will continue to drop upon discharge. -pm CBC -plan to d/c home with hospice if Hgb remains stable -will contact ortho re: surgical  site   Acute on Chronic Thrombocytopenia Stable. Plt 71 today (50>70>82>71). -Daily CBC  Hyperkalemia-resolved. K 4.4 this morning. Received Lokelma x1 yesterday for K 5.4 -Daily BMP   CKD Stage 3b Stable, at baseline. Cr 1.18 this morning, GFR 42 (baseline Cr ~1.2). -Daily BMP -Avoid nephrotoxic meds   Bronchiectasis: chronic On 2L home O2 as needed. -Continue supplemental O2   A-fib/complete heart block s/p pacemaker insertion Stable. Remains in sinus rhythm, pacemaker in place. Not on anticoagulation due to hospice. -Continue to monitor   HFpEF Chronic, stable. Last echo June 2021 showed EF 60-65% and was otherwise normal. On Lasix 20mg  daily prn for edema, appears euvolemic. -Holding home Lasix -Monitor fluid status   Hypothyroidism Chronic, stable. On Synthroid 142mcg at home. -Continue home synthroid   Gout Chronic, stable. On colchicine 0.6mg  BID and allopurinol 100mg  daily at home. -Continue home allopurinol -Colchicine at 0.3mg  daily due to renal function   Chronic back pain Stable. On 50mg  tramadol q6h prn and gabapentin 200mg  BID at home. -Tramadol 50mg  q6h prn -Cont gabapentin 100 mg TID (renal dose)   Depression Chronic, stable. On Mirtazapine 50mg  daily and trazodone 50mg  nightly at home. -Continue home mirtazapine and trazodone   FEN/GI: Soft diet PPx: SCDs   Status is: Inpatient Remains inpatient appropriate because:Unsafe d/c plan   Dispo:  Patient From: Home  Planned Disposition: Home with Health Care Svc  Expected discharge date: 04/01/20  Medically stable for discharge: Yes    Subjective:  No acute events overnight. Per daughter, patient still with significant hip pain when working with PT and using bedside commode. Otherwise no complaints.  Objective: Temp:  [98 F (36.7 C)-98.1 F (36.7 C)] 98.1 F (36.7 C) (11/30 0604) Pulse Rate:  [67] 67 (11/30 0604) Resp:  [16-18] 18 (11/30 0604) BP: (  130-156)/(47-56) 130/47 (11/30  0604) SpO2:  [100 %] 100 % (11/30 0604) Physical Exam: General: alert, elderly female resting comfortably in bed Cardiovascular: RRR, normal S1/S2 without m/r/g Respiratory: normal WOB, mild crackles at L lung base, lungs otherwise clear Abdomen: soft, nontender, nondistended Extremities: significant ecchymosis on R lateral hip and R flank/back. Bandage with dried blood over surgical site. Trace nonpitting edema of R ankle.  Laboratory: Recent Labs  Lab 03/29/20 0034 03/30/20 0230 03/31/20 0242  WBC 3.9* 4.0 3.4*  HGB 9.3* 8.9* 8.1*  HCT 28.5* 28.1* 24.8*  PLT 70* 82* 71*   Recent Labs  Lab 03/29/20 0034 03/30/20 0230 03/31/20 0242  NA 132* 130* 127*  K 5.1 5.4* 4.4  CL 99 98 98  CO2 27 26 26   BUN 31* 32* 32*  CREATININE 1.36* 1.25* 1.18*  CALCIUM 8.1* 7.8* 7.6*  GLUCOSE 110* 83 81    Imaging/Diagnostic Tests: No new imaging/diagnostic tests   Jordan Dad, MD 03/31/2020, 6:17 AM PGY-1, [place] Intern pager: [phone], text pages welcome

## 2020-04-01 DIAGNOSIS — I2510 Atherosclerotic heart disease of native coronary artery without angina pectoris: Secondary | ICD-10-CM | POA: Diagnosis not present

## 2020-04-01 DIAGNOSIS — I69318 Other symptoms and signs involving cognitive functions following cerebral infarction: Secondary | ICD-10-CM | POA: Diagnosis not present

## 2020-04-01 DIAGNOSIS — Z6824 Body mass index (BMI) 24.0-24.9, adult: Secondary | ICD-10-CM | POA: Diagnosis not present

## 2020-04-01 DIAGNOSIS — I1310 Hypertensive heart and chronic kidney disease without heart failure, with stage 1 through stage 4 chronic kidney disease, or unspecified chronic kidney disease: Secondary | ICD-10-CM | POA: Diagnosis not present

## 2020-04-01 DIAGNOSIS — Z9981 Dependence on supplemental oxygen: Secondary | ICD-10-CM | POA: Diagnosis not present

## 2020-04-01 DIAGNOSIS — K219 Gastro-esophageal reflux disease without esophagitis: Secondary | ICD-10-CM | POA: Diagnosis not present

## 2020-04-01 DIAGNOSIS — N189 Chronic kidney disease, unspecified: Secondary | ICD-10-CM | POA: Diagnosis not present

## 2020-04-01 DIAGNOSIS — Z8679 Personal history of other diseases of the circulatory system: Secondary | ICD-10-CM | POA: Diagnosis not present

## 2020-04-01 DIAGNOSIS — Z8719 Personal history of other diseases of the digestive system: Secondary | ICD-10-CM | POA: Diagnosis not present

## 2020-04-01 DIAGNOSIS — F0390 Unspecified dementia without behavioral disturbance: Secondary | ICD-10-CM | POA: Diagnosis not present

## 2020-04-01 DIAGNOSIS — M109 Gout, unspecified: Secondary | ICD-10-CM | POA: Diagnosis not present

## 2020-04-01 DIAGNOSIS — E785 Hyperlipidemia, unspecified: Secondary | ICD-10-CM | POA: Diagnosis not present

## 2020-04-01 DIAGNOSIS — Z741 Need for assistance with personal care: Secondary | ICD-10-CM | POA: Diagnosis not present

## 2020-04-01 DIAGNOSIS — E039 Hypothyroidism, unspecified: Secondary | ICD-10-CM | POA: Diagnosis not present

## 2020-04-02 DIAGNOSIS — I2510 Atherosclerotic heart disease of native coronary artery without angina pectoris: Secondary | ICD-10-CM | POA: Diagnosis not present

## 2020-04-02 DIAGNOSIS — I1310 Hypertensive heart and chronic kidney disease without heart failure, with stage 1 through stage 4 chronic kidney disease, or unspecified chronic kidney disease: Secondary | ICD-10-CM | POA: Diagnosis not present

## 2020-04-02 DIAGNOSIS — E785 Hyperlipidemia, unspecified: Secondary | ICD-10-CM | POA: Diagnosis not present

## 2020-04-02 DIAGNOSIS — N189 Chronic kidney disease, unspecified: Secondary | ICD-10-CM | POA: Diagnosis not present

## 2020-04-02 DIAGNOSIS — Z8679 Personal history of other diseases of the circulatory system: Secondary | ICD-10-CM | POA: Diagnosis not present

## 2020-04-02 DIAGNOSIS — I69318 Other symptoms and signs involving cognitive functions following cerebral infarction: Secondary | ICD-10-CM | POA: Diagnosis not present

## 2020-04-06 ENCOUNTER — Telehealth: Payer: Self-pay | Admitting: Cardiology

## 2020-04-06 DIAGNOSIS — I2510 Atherosclerotic heart disease of native coronary artery without angina pectoris: Secondary | ICD-10-CM | POA: Diagnosis not present

## 2020-04-06 DIAGNOSIS — E785 Hyperlipidemia, unspecified: Secondary | ICD-10-CM | POA: Diagnosis not present

## 2020-04-06 DIAGNOSIS — M7989 Other specified soft tissue disorders: Secondary | ICD-10-CM | POA: Diagnosis not present

## 2020-04-06 DIAGNOSIS — Z8679 Personal history of other diseases of the circulatory system: Secondary | ICD-10-CM | POA: Diagnosis not present

## 2020-04-06 DIAGNOSIS — N189 Chronic kidney disease, unspecified: Secondary | ICD-10-CM | POA: Diagnosis not present

## 2020-04-06 DIAGNOSIS — I69318 Other symptoms and signs involving cognitive functions following cerebral infarction: Secondary | ICD-10-CM | POA: Diagnosis not present

## 2020-04-06 DIAGNOSIS — I1310 Hypertensive heart and chronic kidney disease without heart failure, with stage 1 through stage 4 chronic kidney disease, or unspecified chronic kidney disease: Secondary | ICD-10-CM | POA: Diagnosis not present

## 2020-04-06 NOTE — Telephone Encounter (Signed)
Patient's daughter reached out to me stating that hospice nurse felt that patient may have DVT of the right leg.  Patient is not on anticoagulation due to risk of fall although she has remote history of atrial fibrillation.  She underwent right hip replacement recently about a week ago.  I will schedule for a stat/urgent right lower extremity venous duplex to evaluate for DVT.  Patient is presently in hospice, would like to avoid her going to the emergency room and swelling has been ongoing for the past 3 to 4 days.  I spoke with patient's daughter [name].  I spent 15 minutes on the telephone, review of records and coordination of care.    ICD-10-CM   1. Right leg swelling  M79.89 UE VENOUS DUPLEX    Adrian Prows, MD, [facility] 04/06/2020, 5:31 PM Office: [phone] Pager: [phone]

## 2020-04-07 ENCOUNTER — Ambulatory Visit (HOSPITAL_COMMUNITY)
Admission: RE | Admit: 2020-04-07 | Discharge: 2020-04-07 | Disposition: A | Discharge status: Home / Self Care | Source: Ambulatory Visit | Attending: Cardiology | Admitting: Cardiology

## 2020-04-07 ENCOUNTER — Other Ambulatory Visit: Payer: Self-pay

## 2020-04-07 DIAGNOSIS — Z8679 Personal history of other diseases of the circulatory system: Secondary | ICD-10-CM | POA: Diagnosis not present

## 2020-04-07 DIAGNOSIS — M7989 Other specified soft tissue disorders: Secondary | ICD-10-CM

## 2020-04-07 DIAGNOSIS — I69318 Other symptoms and signs involving cognitive functions following cerebral infarction: Secondary | ICD-10-CM | POA: Diagnosis not present

## 2020-04-07 DIAGNOSIS — E785 Hyperlipidemia, unspecified: Secondary | ICD-10-CM | POA: Diagnosis not present

## 2020-04-07 DIAGNOSIS — N189 Chronic kidney disease, unspecified: Secondary | ICD-10-CM | POA: Diagnosis not present

## 2020-04-07 DIAGNOSIS — I2510 Atherosclerotic heart disease of native coronary artery without angina pectoris: Secondary | ICD-10-CM | POA: Diagnosis not present

## 2020-04-07 DIAGNOSIS — I1310 Hypertensive heart and chronic kidney disease without heart failure, with stage 1 through stage 4 chronic kidney disease, or unspecified chronic kidney disease: Secondary | ICD-10-CM | POA: Diagnosis not present

## 2020-04-07 DIAGNOSIS — R5381 Other malaise: Secondary | ICD-10-CM | POA: Diagnosis not present

## 2020-04-07 DIAGNOSIS — Z7401 Bed confinement status: Secondary | ICD-10-CM | POA: Diagnosis not present

## 2020-04-07 DIAGNOSIS — M2550 Pain in unspecified joint: Secondary | ICD-10-CM | POA: Diagnosis not present

## 2020-04-07 DIAGNOSIS — Z743 Need for continuous supervision: Secondary | ICD-10-CM | POA: Diagnosis not present

## 2020-04-07 NOTE — Progress Notes (Signed)
Thank you so much and really appreciate your quick response

## 2020-04-07 NOTE — Progress Notes (Signed)
Lower extremity venous RT study completed.  Preliminary results relayed to Einar Gip, MD.   See CV Proc for preliminary results report.   Darlin Coco, RDMS

## 2020-04-08 DIAGNOSIS — I1310 Hypertensive heart and chronic kidney disease without heart failure, with stage 1 through stage 4 chronic kidney disease, or unspecified chronic kidney disease: Secondary | ICD-10-CM | POA: Diagnosis not present

## 2020-04-08 DIAGNOSIS — I2510 Atherosclerotic heart disease of native coronary artery without angina pectoris: Secondary | ICD-10-CM | POA: Diagnosis not present

## 2020-04-08 DIAGNOSIS — E785 Hyperlipidemia, unspecified: Secondary | ICD-10-CM | POA: Diagnosis not present

## 2020-04-08 DIAGNOSIS — I69318 Other symptoms and signs involving cognitive functions following cerebral infarction: Secondary | ICD-10-CM | POA: Diagnosis not present

## 2020-04-08 DIAGNOSIS — N189 Chronic kidney disease, unspecified: Secondary | ICD-10-CM | POA: Diagnosis not present

## 2020-04-08 DIAGNOSIS — Z8679 Personal history of other diseases of the circulatory system: Secondary | ICD-10-CM | POA: Diagnosis not present

## 2020-04-09 DIAGNOSIS — I2510 Atherosclerotic heart disease of native coronary artery without angina pectoris: Secondary | ICD-10-CM | POA: Diagnosis not present

## 2020-04-09 DIAGNOSIS — I1310 Hypertensive heart and chronic kidney disease without heart failure, with stage 1 through stage 4 chronic kidney disease, or unspecified chronic kidney disease: Secondary | ICD-10-CM | POA: Diagnosis not present

## 2020-04-09 DIAGNOSIS — E785 Hyperlipidemia, unspecified: Secondary | ICD-10-CM | POA: Diagnosis not present

## 2020-04-09 DIAGNOSIS — Z8679 Personal history of other diseases of the circulatory system: Secondary | ICD-10-CM | POA: Diagnosis not present

## 2020-04-09 DIAGNOSIS — N189 Chronic kidney disease, unspecified: Secondary | ICD-10-CM | POA: Diagnosis not present

## 2020-04-09 DIAGNOSIS — I69318 Other symptoms and signs involving cognitive functions following cerebral infarction: Secondary | ICD-10-CM | POA: Diagnosis not present

## 2020-04-13 DIAGNOSIS — I2510 Atherosclerotic heart disease of native coronary artery without angina pectoris: Secondary | ICD-10-CM | POA: Diagnosis not present

## 2020-04-13 DIAGNOSIS — I1310 Hypertensive heart and chronic kidney disease without heart failure, with stage 1 through stage 4 chronic kidney disease, or unspecified chronic kidney disease: Secondary | ICD-10-CM | POA: Diagnosis not present

## 2020-04-13 DIAGNOSIS — E785 Hyperlipidemia, unspecified: Secondary | ICD-10-CM | POA: Diagnosis not present

## 2020-04-13 DIAGNOSIS — W19XXXA Unspecified fall, initial encounter: Secondary | ICD-10-CM | POA: Diagnosis not present

## 2020-04-13 DIAGNOSIS — S72141A Displaced intertrochanteric fracture of right femur, initial encounter for closed fracture: Secondary | ICD-10-CM | POA: Diagnosis not present

## 2020-04-13 DIAGNOSIS — I69318 Other symptoms and signs involving cognitive functions following cerebral infarction: Secondary | ICD-10-CM | POA: Diagnosis not present

## 2020-04-13 DIAGNOSIS — R279 Unspecified lack of coordination: Secondary | ICD-10-CM | POA: Diagnosis not present

## 2020-04-13 DIAGNOSIS — N189 Chronic kidney disease, unspecified: Secondary | ICD-10-CM | POA: Diagnosis not present

## 2020-04-13 DIAGNOSIS — Z4789 Encounter for other orthopedic aftercare: Secondary | ICD-10-CM | POA: Diagnosis not present

## 2020-04-13 DIAGNOSIS — Z8679 Personal history of other diseases of the circulatory system: Secondary | ICD-10-CM | POA: Diagnosis not present

## 2020-04-13 DIAGNOSIS — Z743 Need for continuous supervision: Secondary | ICD-10-CM | POA: Diagnosis not present

## 2020-04-13 DIAGNOSIS — I959 Hypotension, unspecified: Secondary | ICD-10-CM | POA: Diagnosis not present

## 2020-04-14 DIAGNOSIS — I2510 Atherosclerotic heart disease of native coronary artery without angina pectoris: Secondary | ICD-10-CM | POA: Diagnosis not present

## 2020-04-14 DIAGNOSIS — E785 Hyperlipidemia, unspecified: Secondary | ICD-10-CM | POA: Diagnosis not present

## 2020-04-14 DIAGNOSIS — I1310 Hypertensive heart and chronic kidney disease without heart failure, with stage 1 through stage 4 chronic kidney disease, or unspecified chronic kidney disease: Secondary | ICD-10-CM | POA: Diagnosis not present

## 2020-04-14 DIAGNOSIS — I69318 Other symptoms and signs involving cognitive functions following cerebral infarction: Secondary | ICD-10-CM | POA: Diagnosis not present

## 2020-04-14 DIAGNOSIS — Z8679 Personal history of other diseases of the circulatory system: Secondary | ICD-10-CM | POA: Diagnosis not present

## 2020-04-14 DIAGNOSIS — N189 Chronic kidney disease, unspecified: Secondary | ICD-10-CM | POA: Diagnosis not present

## 2020-04-16 DIAGNOSIS — I69318 Other symptoms and signs involving cognitive functions following cerebral infarction: Secondary | ICD-10-CM | POA: Diagnosis not present

## 2020-04-16 DIAGNOSIS — I1310 Hypertensive heart and chronic kidney disease without heart failure, with stage 1 through stage 4 chronic kidney disease, or unspecified chronic kidney disease: Secondary | ICD-10-CM | POA: Diagnosis not present

## 2020-04-16 DIAGNOSIS — E785 Hyperlipidemia, unspecified: Secondary | ICD-10-CM | POA: Diagnosis not present

## 2020-04-16 DIAGNOSIS — N189 Chronic kidney disease, unspecified: Secondary | ICD-10-CM | POA: Diagnosis not present

## 2020-04-16 DIAGNOSIS — Z8679 Personal history of other diseases of the circulatory system: Secondary | ICD-10-CM | POA: Diagnosis not present

## 2020-04-16 DIAGNOSIS — I2510 Atherosclerotic heart disease of native coronary artery without angina pectoris: Secondary | ICD-10-CM | POA: Diagnosis not present

## 2020-04-17 DIAGNOSIS — Z8679 Personal history of other diseases of the circulatory system: Secondary | ICD-10-CM | POA: Diagnosis not present

## 2020-04-17 DIAGNOSIS — I69318 Other symptoms and signs involving cognitive functions following cerebral infarction: Secondary | ICD-10-CM | POA: Diagnosis not present

## 2020-04-17 DIAGNOSIS — N189 Chronic kidney disease, unspecified: Secondary | ICD-10-CM | POA: Diagnosis not present

## 2020-04-17 DIAGNOSIS — E785 Hyperlipidemia, unspecified: Secondary | ICD-10-CM | POA: Diagnosis not present

## 2020-04-17 DIAGNOSIS — I1310 Hypertensive heart and chronic kidney disease without heart failure, with stage 1 through stage 4 chronic kidney disease, or unspecified chronic kidney disease: Secondary | ICD-10-CM | POA: Diagnosis not present

## 2020-04-17 DIAGNOSIS — I2510 Atherosclerotic heart disease of native coronary artery without angina pectoris: Secondary | ICD-10-CM | POA: Diagnosis not present

## 2020-04-21 DIAGNOSIS — E785 Hyperlipidemia, unspecified: Secondary | ICD-10-CM | POA: Diagnosis not present

## 2020-04-21 DIAGNOSIS — Z8679 Personal history of other diseases of the circulatory system: Secondary | ICD-10-CM | POA: Diagnosis not present

## 2020-04-21 DIAGNOSIS — N189 Chronic kidney disease, unspecified: Secondary | ICD-10-CM | POA: Diagnosis not present

## 2020-04-21 DIAGNOSIS — I69318 Other symptoms and signs involving cognitive functions following cerebral infarction: Secondary | ICD-10-CM | POA: Diagnosis not present

## 2020-04-21 DIAGNOSIS — I2510 Atherosclerotic heart disease of native coronary artery without angina pectoris: Secondary | ICD-10-CM | POA: Diagnosis not present

## 2020-04-21 DIAGNOSIS — I1310 Hypertensive heart and chronic kidney disease without heart failure, with stage 1 through stage 4 chronic kidney disease, or unspecified chronic kidney disease: Secondary | ICD-10-CM | POA: Diagnosis not present

## 2020-04-22 ENCOUNTER — Telehealth: Payer: Self-pay

## 2020-04-22 DIAGNOSIS — E785 Hyperlipidemia, unspecified: Secondary | ICD-10-CM | POA: Diagnosis not present

## 2020-04-22 DIAGNOSIS — Z8679 Personal history of other diseases of the circulatory system: Secondary | ICD-10-CM | POA: Diagnosis not present

## 2020-04-22 DIAGNOSIS — I2510 Atherosclerotic heart disease of native coronary artery without angina pectoris: Secondary | ICD-10-CM | POA: Diagnosis not present

## 2020-04-22 DIAGNOSIS — N189 Chronic kidney disease, unspecified: Secondary | ICD-10-CM | POA: Diagnosis not present

## 2020-04-22 DIAGNOSIS — I69318 Other symptoms and signs involving cognitive functions following cerebral infarction: Secondary | ICD-10-CM | POA: Diagnosis not present

## 2020-04-22 DIAGNOSIS — I1310 Hypertensive heart and chronic kidney disease without heart failure, with stage 1 through stage 4 chronic kidney disease, or unspecified chronic kidney disease: Secondary | ICD-10-CM | POA: Diagnosis not present

## 2020-04-22 NOTE — Telephone Encounter (Signed)
  Care Management   Outreach Note  04/22/2020 Name: Jordan [name] MRN: [id number] DOB: [birth year]  Referred by: McDiarmid, Blane Ohara, MD Reason for referral : Chronic Care Management (Family need)   Jordan [name] is enrolled in a Managed Medicaid Health Plan: No  An unsuccessful telephone outreach was attempted today. The patient was referred to the case management team for assistance with care management and care coordination.   Follow Up Plan: A HIPAA compliant phone message was left for the patient providing contact information and requesting a return call.  The care management team will reach out to the patient again over the next 5-7 days.   Lazaro Arms RN, BSN, [facility] Care Management Coordinator [place] Phone: [phone] I Fax: [phone]

## 2020-04-23 DIAGNOSIS — Z8679 Personal history of other diseases of the circulatory system: Secondary | ICD-10-CM | POA: Diagnosis not present

## 2020-04-23 DIAGNOSIS — N189 Chronic kidney disease, unspecified: Secondary | ICD-10-CM | POA: Diagnosis not present

## 2020-04-23 DIAGNOSIS — E785 Hyperlipidemia, unspecified: Secondary | ICD-10-CM | POA: Diagnosis not present

## 2020-04-23 DIAGNOSIS — I69318 Other symptoms and signs involving cognitive functions following cerebral infarction: Secondary | ICD-10-CM | POA: Diagnosis not present

## 2020-04-23 DIAGNOSIS — I2510 Atherosclerotic heart disease of native coronary artery without angina pectoris: Secondary | ICD-10-CM | POA: Diagnosis not present

## 2020-04-23 DIAGNOSIS — I1310 Hypertensive heart and chronic kidney disease without heart failure, with stage 1 through stage 4 chronic kidney disease, or unspecified chronic kidney disease: Secondary | ICD-10-CM | POA: Diagnosis not present

## 2020-04-24 DIAGNOSIS — I1310 Hypertensive heart and chronic kidney disease without heart failure, with stage 1 through stage 4 chronic kidney disease, or unspecified chronic kidney disease: Secondary | ICD-10-CM | POA: Diagnosis not present

## 2020-04-24 DIAGNOSIS — N189 Chronic kidney disease, unspecified: Secondary | ICD-10-CM | POA: Diagnosis not present

## 2020-04-24 DIAGNOSIS — E785 Hyperlipidemia, unspecified: Secondary | ICD-10-CM | POA: Diagnosis not present

## 2020-04-24 DIAGNOSIS — I69318 Other symptoms and signs involving cognitive functions following cerebral infarction: Secondary | ICD-10-CM | POA: Diagnosis not present

## 2020-04-24 DIAGNOSIS — Z8679 Personal history of other diseases of the circulatory system: Secondary | ICD-10-CM | POA: Diagnosis not present

## 2020-04-24 DIAGNOSIS — I2510 Atherosclerotic heart disease of native coronary artery without angina pectoris: Secondary | ICD-10-CM | POA: Diagnosis not present

## 2020-04-27 DIAGNOSIS — E785 Hyperlipidemia, unspecified: Secondary | ICD-10-CM | POA: Diagnosis not present

## 2020-04-27 DIAGNOSIS — I1310 Hypertensive heart and chronic kidney disease without heart failure, with stage 1 through stage 4 chronic kidney disease, or unspecified chronic kidney disease: Secondary | ICD-10-CM | POA: Diagnosis not present

## 2020-04-27 DIAGNOSIS — I2510 Atherosclerotic heart disease of native coronary artery without angina pectoris: Secondary | ICD-10-CM | POA: Diagnosis not present

## 2020-04-27 DIAGNOSIS — N189 Chronic kidney disease, unspecified: Secondary | ICD-10-CM | POA: Diagnosis not present

## 2020-04-27 DIAGNOSIS — Z8679 Personal history of other diseases of the circulatory system: Secondary | ICD-10-CM | POA: Diagnosis not present

## 2020-04-27 DIAGNOSIS — I69318 Other symptoms and signs involving cognitive functions following cerebral infarction: Secondary | ICD-10-CM | POA: Diagnosis not present

## 2020-04-28 ENCOUNTER — Ambulatory Visit: Payer: Medicare Other

## 2020-04-28 DIAGNOSIS — I2510 Atherosclerotic heart disease of native coronary artery without angina pectoris: Secondary | ICD-10-CM | POA: Diagnosis not present

## 2020-04-28 DIAGNOSIS — E785 Hyperlipidemia, unspecified: Secondary | ICD-10-CM | POA: Diagnosis not present

## 2020-04-28 DIAGNOSIS — Z8679 Personal history of other diseases of the circulatory system: Secondary | ICD-10-CM | POA: Diagnosis not present

## 2020-04-28 DIAGNOSIS — I69318 Other symptoms and signs involving cognitive functions following cerebral infarction: Secondary | ICD-10-CM | POA: Diagnosis not present

## 2020-04-28 DIAGNOSIS — N189 Chronic kidney disease, unspecified: Secondary | ICD-10-CM | POA: Diagnosis not present

## 2020-04-28 DIAGNOSIS — I1310 Hypertensive heart and chronic kidney disease without heart failure, with stage 1 through stage 4 chronic kidney disease, or unspecified chronic kidney disease: Secondary | ICD-10-CM | POA: Diagnosis not present

## 2020-04-28 NOTE — Patient Instructions (Signed)
  Ms. Mettler  it was nice speaking with you. Please call me directly [phone] if you have questions about the goals we discussed.   Goals Addressed              This Visit's Progress   .  COMPLETED: I need silicon pads for my back (pt-stated)        CARE PLAN ENTRY (see longitudinal plan of care for additional care plan information)  Current Barriers:  . Care Coordination needs related to DME in a patient with Malnutrition, frailty and Unintentional weight loss.  Nurse Case Manager Clinical Goal(s):  Marland Kitchen Over the next 14 days, patient will verbalize understanding of plan for silicon adhesive pads  Interventions:  . Inter-disciplinary care team collaboration (see longitudinal plan of care) . Evaluation of current treatment plan related to silicon pads and patient's adherence to plan as established by provider. . Advised patient advised the patient that I would contact [facility] to find out any information on the silicon pads . Discussed plans with patient for ongoing care management follow up and provided patient with direct contact information for care management team . Daughter states that due to her mother's fraile nature she uses the pads to put on her coccyx to help deter any skin breakdown. 04/28/20 . Spoke with [name] today and she that Hospice has been brought in for her mother.  In Mid November her mother had fallen and broken her hip.  She was able to stay with her in the hospital but she is at home now and Hospice is tending to her needs.  She did states that she does have bed sores on her bottom.  They are applying an ointment and silicone pads but she wanted to know what else could be done.  My suggestion was that they could use a wedge pillow that when they turn her to give her more support and not bother her as much. I explained to Mrs. Ruales that I would no longer be calling since they have Hospice.  . Patient/daughter verbalizes understanding of plan  . Attends  all scheduled provider appointments . Calls provider office for new concerns or questions  Please see past updates related to this goal by clicking on the "Past Updates" button in the selected goal       There are no care plans to display for this patient.    Ms. Akard received Care Management services today:  1. Care Management services include personalized support from designated clinical staff supervised by her physician, including individualized plan of care and coordination with other care providers 2. 24/7 contact [phone] for assistance for urgent and routine care needs. 3. Care Management are voluntary services and be declined at any time by calling the office.  The patient verbalized understanding of instructions provided today and declined a print copy of patient instruction materials.    Follow up plan: No further follow up required:   Lazaro Arms, RN

## 2020-04-28 NOTE — Chronic Care Management (AMB) (Signed)
Care Management   RN Case Manager Follow UpNote  04/28/2020 Name: Jordan [name] MRN: [id number] DOB: May 15, 1919 Jordan [name] is a 86 y.o. year old female who sees McDiarmid, Blane Ohara, MD for primary care.  Patient is enrolled in a Managed Medicaid plan: No.  The Care Management team was consulted by PCP to assist the patient with  Care Coordination.   RNCM engaged with Jordan [name] today by telephone in response to provider referral for RN case management and/or care coordination services. See care plan below for details during this encounter.  Follow up Plan:No further follow up Required.  Advanced Directives Status:Not addressed in this encounter.     SDOH (Social Determinants of [name]) assessments performed: No     Goals Addressed              This Visit's Progress   .  COMPLETED: I need silicon pads for my back (pt-stated)        CARE PLAN ENTRY (see longitudinal plan of care for additional care plan information)  Current Barriers:  . Care Coordination needs related to DME in a patient with Malnutrition, frailty and Unintentional weight loss.  Nurse Case Manager Clinical Goal(s):  Marland Kitchen Over the next 14 days, patient will verbalize understanding of plan for silicon adhesive pads  Interventions:  . Inter-disciplinary care team collaboration (see longitudinal plan of care) . Evaluation of current treatment plan related to silicon pads and patient's adherence to plan as established by provider. . Advised patient advised the patient that I would contact Jordan  [name] to find out any information on the silicon pads . Discussed plans with patient for ongoing care management follow up and provided patient with direct contact information for care management team . Daughter states that due to her mother's fraile nature she uses the pads to put on her coccyx to help deter any skin breakdown. 04/28/20 . Spoke with Jordan [name] today and she that Jordan [name] has been brought in for her mother.  In  Mid November her mother had fallen and broken her hip.  She was able to stay with her in the hospital but she is at home now and Jordan [name] is tending to her needs.  She did states that she does have bed sores on her bottom.  They are applying an ointment and silicone pads but she wanted to know what else could be done.  My suggestion was that they could use a wedge pillow that when they turn her to give her more support and not bother her as much. I explained to Jordan [name] that I would no longer be calling since they have Jordan [name].  . Patient/daughter verbalizes understanding of plan  . Attends all scheduled provider appointments . Calls provider office for new concerns or questions  Please see past updates related to this goal by clicking on the "Past Updates" button in the selected goal         Outpatient Encounter Medications as of 04/28/2020  Medication Sig  . acetaminophen (TYLENOL) 160 MG/5ML solution Take 10.2 mLs (325 mg total) by mouth every 4 (four) hours.  Marland Kitchen allopurinol (ZYLOPRIM) 100 MG tablet TAKE 1 TABLET(100 MG) BY MOUTH DAILY  . colchicine 0.6 MG tablet Take 0.5 tablets (0.3 mg total) by mouth daily.  . cycloSPORINE (RESTASIS) 0.05 % ophthalmic emulsion Place 1 drop into both eyes at bedtime.  . diclofenac Sodium (VOLTAREN) 1 % GEL Apply 2 g topically 4 (four) times daily.   Marland Kitchen docusate sodium (  COLACE) 100 MG capsule Take 2 capsules (200 mg total) by mouth 2 (two) times daily as needed for mild constipation.  . fesoterodine (TOVIAZ) 4 MG TB24 tablet Take 1 tablet (4 mg total) by mouth daily.  Marland Kitchen gabapentin (NEURONTIN) 100 MG capsule Take 1 capsule (100 mg total) by mouth 2 (two) times daily as needed.  Marland Kitchen HYDROcodone-acetaminophen (NORCO/VICODIN) 5-325 MG tablet Take 1 tablet by mouth in the morning and at bedtime. For severe break through pain.  Marland Kitchen levothyroxine (SYNTHROID) 100 MCG tablet TAKE 1 TABLET BY MOUTH ONCE DAILY (Patient taking differently: Take 100 mcg by mouth daily  before breakfast. )  . lidocaine (XYLOCAINE) 5 % ointment APPLY 1 APPLICATION TOPICALLY DAILY AS NEEDED FOR BACK PAIN (Patient taking differently: Apply 1 application topically daily as needed for mild pain. )  . mirtazapine (REMERON) 15 MG tablet Take 1 tablet (15 mg total) by mouth at bedtime.  . Nutritional Supplements (FEEDING SUPPLEMENT, NEPRO CARB STEADY,) LIQD Take 237 mLs by mouth 2 (two) times daily between meals.  Marland Kitchen omeprazole (PRILOSEC) 20 MG capsule TAKE 1 CAPSULE(20 MG) BY MOUTH DAILY (Patient taking differently: Take 20 mg by mouth daily. )  . ondansetron (ZOFRAN ODT) 4 MG disintegrating tablet Take 1 tablet (4 mg total) by mouth every 8 (eight) hours as needed for nausea or vomiting.  . polyethylene glycol (MIRALAX / GLYCOLAX) 17 g packet Take 17 g by mouth daily as needed for moderate constipation. take 17 grams by mouth once daily  . traMADol (ULTRAM) 50 MG tablet Take 1 tablet (50 mg total) by mouth every 6 (six) hours as needed for moderate pain or severe pain.  . TRAVATAN Z 0.004 % SOLN ophthalmic solution Place 1 drop into both eyes at bedtime.   . traZODone (DESYREL) 50 MG tablet TAKE 1 TABLET BY MOUTH AT BEDTIME (Patient taking differently: Take 50 mg by mouth at bedtime. )  . Wound Dressings (ALLEVYN LIFE SACRUM) PADS Apply 1 Units topically as needed.   No facility-administered encounter medications on file as of 04/28/2020.    Review of patient status, including review of consultants reports, relevant laboratory and other test results, and collaboration with appropriate care team members and the patient's provider was performed as part of comprehensive patient evaluation and provision of chronic care management services.       Information about Care Management services was shared with [name] today including:  1. Care Management services include personalized support from designated clinical staff supervised by her physician, including individualized plan of care and  coordination with other care providers 2. Remind patient of 24/7 contact phone numbers to provider's office for assistance with urgent and routine care needs. 3. Care Management services are voluntary and patient may stop at any time .   Patient agreed to services provided today and verbal consent obtained.

## 2020-04-30 DIAGNOSIS — Z8679 Personal history of other diseases of the circulatory system: Secondary | ICD-10-CM | POA: Diagnosis not present

## 2020-04-30 DIAGNOSIS — I69318 Other symptoms and signs involving cognitive functions following cerebral infarction: Secondary | ICD-10-CM | POA: Diagnosis not present

## 2020-04-30 DIAGNOSIS — I2510 Atherosclerotic heart disease of native coronary artery without angina pectoris: Secondary | ICD-10-CM | POA: Diagnosis not present

## 2020-04-30 DIAGNOSIS — E785 Hyperlipidemia, unspecified: Secondary | ICD-10-CM | POA: Diagnosis not present

## 2020-04-30 DIAGNOSIS — I1310 Hypertensive heart and chronic kidney disease without heart failure, with stage 1 through stage 4 chronic kidney disease, or unspecified chronic kidney disease: Secondary | ICD-10-CM | POA: Diagnosis not present

## 2020-04-30 DIAGNOSIS — N189 Chronic kidney disease, unspecified: Secondary | ICD-10-CM | POA: Diagnosis not present

## 2020-05-02 DIAGNOSIS — M109 Gout, unspecified: Secondary | ICD-10-CM | POA: Diagnosis not present

## 2020-05-02 DIAGNOSIS — I2510 Atherosclerotic heart disease of native coronary artery without angina pectoris: Secondary | ICD-10-CM | POA: Diagnosis not present

## 2020-05-02 DIAGNOSIS — E039 Hypothyroidism, unspecified: Secondary | ICD-10-CM | POA: Diagnosis not present

## 2020-05-02 DIAGNOSIS — N189 Chronic kidney disease, unspecified: Secondary | ICD-10-CM | POA: Diagnosis not present

## 2020-05-02 DIAGNOSIS — Z8719 Personal history of other diseases of the digestive system: Secondary | ICD-10-CM | POA: Diagnosis not present

## 2020-05-02 DIAGNOSIS — Z741 Need for assistance with personal care: Secondary | ICD-10-CM | POA: Diagnosis not present

## 2020-05-02 DIAGNOSIS — F0390 Unspecified dementia without behavioral disturbance: Secondary | ICD-10-CM | POA: Diagnosis not present

## 2020-05-02 DIAGNOSIS — Z8679 Personal history of other diseases of the circulatory system: Secondary | ICD-10-CM | POA: Diagnosis not present

## 2020-05-02 DIAGNOSIS — Z9981 Dependence on supplemental oxygen: Secondary | ICD-10-CM | POA: Diagnosis not present

## 2020-05-02 DIAGNOSIS — K219 Gastro-esophageal reflux disease without esophagitis: Secondary | ICD-10-CM | POA: Diagnosis not present

## 2020-05-02 DIAGNOSIS — Z6824 Body mass index (BMI) 24.0-24.9, adult: Secondary | ICD-10-CM | POA: Diagnosis not present

## 2020-05-02 DIAGNOSIS — I1310 Hypertensive heart and chronic kidney disease without heart failure, with stage 1 through stage 4 chronic kidney disease, or unspecified chronic kidney disease: Secondary | ICD-10-CM | POA: Diagnosis not present

## 2020-05-02 DIAGNOSIS — I69318 Other symptoms and signs involving cognitive functions following cerebral infarction: Secondary | ICD-10-CM | POA: Diagnosis not present

## 2020-05-02 DIAGNOSIS — E785 Hyperlipidemia, unspecified: Secondary | ICD-10-CM | POA: Diagnosis not present

## 2020-05-04 DIAGNOSIS — Z8679 Personal history of other diseases of the circulatory system: Secondary | ICD-10-CM | POA: Diagnosis not present

## 2020-05-04 DIAGNOSIS — I2510 Atherosclerotic heart disease of native coronary artery without angina pectoris: Secondary | ICD-10-CM | POA: Diagnosis not present

## 2020-05-04 DIAGNOSIS — I1310 Hypertensive heart and chronic kidney disease without heart failure, with stage 1 through stage 4 chronic kidney disease, or unspecified chronic kidney disease: Secondary | ICD-10-CM | POA: Diagnosis not present

## 2020-05-04 DIAGNOSIS — I69318 Other symptoms and signs involving cognitive functions following cerebral infarction: Secondary | ICD-10-CM | POA: Diagnosis not present

## 2020-05-04 DIAGNOSIS — N189 Chronic kidney disease, unspecified: Secondary | ICD-10-CM | POA: Diagnosis not present

## 2020-05-04 DIAGNOSIS — E785 Hyperlipidemia, unspecified: Secondary | ICD-10-CM | POA: Diagnosis not present

## 2020-05-05 DIAGNOSIS — I69318 Other symptoms and signs involving cognitive functions following cerebral infarction: Secondary | ICD-10-CM | POA: Diagnosis not present

## 2020-05-05 DIAGNOSIS — N189 Chronic kidney disease, unspecified: Secondary | ICD-10-CM | POA: Diagnosis not present

## 2020-05-05 DIAGNOSIS — I2510 Atherosclerotic heart disease of native coronary artery without angina pectoris: Secondary | ICD-10-CM | POA: Diagnosis not present

## 2020-05-05 DIAGNOSIS — E785 Hyperlipidemia, unspecified: Secondary | ICD-10-CM | POA: Diagnosis not present

## 2020-05-05 DIAGNOSIS — Z8679 Personal history of other diseases of the circulatory system: Secondary | ICD-10-CM | POA: Diagnosis not present

## 2020-05-05 DIAGNOSIS — I1310 Hypertensive heart and chronic kidney disease without heart failure, with stage 1 through stage 4 chronic kidney disease, or unspecified chronic kidney disease: Secondary | ICD-10-CM | POA: Diagnosis not present

## 2020-05-06 ENCOUNTER — Telehealth: Payer: Self-pay | Admitting: Cardiology

## 2020-05-07 DIAGNOSIS — N189 Chronic kidney disease, unspecified: Secondary | ICD-10-CM | POA: Diagnosis not present

## 2020-05-07 DIAGNOSIS — I1310 Hypertensive heart and chronic kidney disease without heart failure, with stage 1 through stage 4 chronic kidney disease, or unspecified chronic kidney disease: Secondary | ICD-10-CM | POA: Diagnosis not present

## 2020-05-07 DIAGNOSIS — E785 Hyperlipidemia, unspecified: Secondary | ICD-10-CM | POA: Diagnosis not present

## 2020-05-07 DIAGNOSIS — Z8679 Personal history of other diseases of the circulatory system: Secondary | ICD-10-CM | POA: Diagnosis not present

## 2020-05-07 DIAGNOSIS — I2510 Atherosclerotic heart disease of native coronary artery without angina pectoris: Secondary | ICD-10-CM | POA: Diagnosis not present

## 2020-05-07 DIAGNOSIS — I69318 Other symptoms and signs involving cognitive functions following cerebral infarction: Secondary | ICD-10-CM | POA: Diagnosis not present

## 2020-05-08 ENCOUNTER — Telehealth: Payer: Self-pay

## 2020-05-08 DIAGNOSIS — N189 Chronic kidney disease, unspecified: Secondary | ICD-10-CM | POA: Diagnosis not present

## 2020-05-08 DIAGNOSIS — I1310 Hypertensive heart and chronic kidney disease without heart failure, with stage 1 through stage 4 chronic kidney disease, or unspecified chronic kidney disease: Secondary | ICD-10-CM | POA: Diagnosis not present

## 2020-05-08 DIAGNOSIS — Z8679 Personal history of other diseases of the circulatory system: Secondary | ICD-10-CM | POA: Diagnosis not present

## 2020-05-08 DIAGNOSIS — I2510 Atherosclerotic heart disease of native coronary artery without angina pectoris: Secondary | ICD-10-CM | POA: Diagnosis not present

## 2020-05-08 DIAGNOSIS — E785 Hyperlipidemia, unspecified: Secondary | ICD-10-CM | POA: Diagnosis not present

## 2020-05-08 DIAGNOSIS — I69318 Other symptoms and signs involving cognitive functions following cerebral infarction: Secondary | ICD-10-CM | POA: Diagnosis not present

## 2020-05-08 NOTE — Telephone Encounter (Signed)
I have filled EMS insurance requirement and necessity for EMS transfer for getting venous duplex.

## 2020-05-08 NOTE — Telephone Encounter (Signed)
Pts daughter called regarding a wound on mother that is getting worse. The wound is in the the sacrum area, on coccyx. She states the the nurse is just covering it and wants to know if she should use an antibiotic. She is concerned about the hospice service because of the wound. The hospice nurse was using a silicone patch and a spray wound cleanser called skintegrity. She was using an antibiotic with zinc in it. And now she is using a polymem patch, it is 3x2 and hardly covers the wound. She wants to know what she can do to make it better and if you have any advice. AD

## 2020-05-09 NOTE — Telephone Encounter (Signed)
Have the nurse call me. I sent her my chart message

## 2020-05-11 ENCOUNTER — Telehealth: Payer: Self-pay | Admitting: Cardiology

## 2020-05-11 DIAGNOSIS — N189 Chronic kidney disease, unspecified: Secondary | ICD-10-CM | POA: Diagnosis not present

## 2020-05-11 DIAGNOSIS — E785 Hyperlipidemia, unspecified: Secondary | ICD-10-CM | POA: Diagnosis not present

## 2020-05-11 DIAGNOSIS — Z515 Encounter for palliative care: Secondary | ICD-10-CM

## 2020-05-11 DIAGNOSIS — I69318 Other symptoms and signs involving cognitive functions following cerebral infarction: Secondary | ICD-10-CM | POA: Diagnosis not present

## 2020-05-11 DIAGNOSIS — R627 Adult failure to thrive: Secondary | ICD-10-CM | POA: Diagnosis not present

## 2020-05-11 DIAGNOSIS — L8942 Pressure ulcer of contiguous site of back, buttock and hip, stage 2: Secondary | ICD-10-CM | POA: Diagnosis not present

## 2020-05-11 DIAGNOSIS — I2510 Atherosclerotic heart disease of native coronary artery without angina pectoris: Secondary | ICD-10-CM | POA: Diagnosis not present

## 2020-05-11 DIAGNOSIS — I1310 Hypertensive heart and chronic kidney disease without heart failure, with stage 1 through stage 4 chronic kidney disease, or unspecified chronic kidney disease: Secondary | ICD-10-CM | POA: Diagnosis not present

## 2020-05-11 DIAGNOSIS — Z8679 Personal history of other diseases of the circulatory system: Secondary | ICD-10-CM | POA: Diagnosis not present

## 2020-05-11 NOTE — Telephone Encounter (Signed)
Patient's daughter had called [place] stating that the decub ulcer is not healing and is slightly larger than before.  I left a message for the RN who has been in charge of the patient to call me, I received a call today, patient's ulceration is related to patient being totally bedridden and immobile.  PT and OT have signed off as they have not been able to get her out of the bed.  Verne Spurr is an Therapist, sports and also has worked in the wound care previously.  She is doing her best to prevent infection and also to keep her comfortable.  Advised him to continue present therapy, I will discuss with the patient's daughter and reassure them.  As patient's nutrition is poor, she is completely bedridden and immobile, chances of wound healing is very low.  I called patient's daughter Jordan [name] and reassured her.  Jordan [name] would like the RN to be more proactive with regard to wound care, advised her that in view of poor nutrition, bedridden status, this can be very difficult.  I have requested that I get an update in 3 to 4 days regarding her progress.  I spent 10 minutes with RN and additional 12 minutes with patient's daughter  1. Failure to thrive in adult   2. Pressure injury of contiguous region involving back and buttock, stage 2, unspecified laterality ([place])   3. Palliative care encounter       Adrian Prows, MD, [facility] 05/11/2020, 12:26 PM Office: [phone] Pager: [phone]

## 2020-05-12 DIAGNOSIS — I1310 Hypertensive heart and chronic kidney disease without heart failure, with stage 1 through stage 4 chronic kidney disease, or unspecified chronic kidney disease: Secondary | ICD-10-CM | POA: Diagnosis not present

## 2020-05-12 DIAGNOSIS — I2510 Atherosclerotic heart disease of native coronary artery without angina pectoris: Secondary | ICD-10-CM | POA: Diagnosis not present

## 2020-05-12 DIAGNOSIS — E785 Hyperlipidemia, unspecified: Secondary | ICD-10-CM | POA: Diagnosis not present

## 2020-05-12 DIAGNOSIS — N189 Chronic kidney disease, unspecified: Secondary | ICD-10-CM | POA: Diagnosis not present

## 2020-05-12 DIAGNOSIS — I69318 Other symptoms and signs involving cognitive functions following cerebral infarction: Secondary | ICD-10-CM | POA: Diagnosis not present

## 2020-05-12 DIAGNOSIS — Z8679 Personal history of other diseases of the circulatory system: Secondary | ICD-10-CM | POA: Diagnosis not present

## 2020-05-12 NOTE — Telephone Encounter (Signed)
If I get the nurse on the phone, do you want me to transfer it now? Or should I get a number so we can call her later in the day?

## 2020-05-13 DIAGNOSIS — E785 Hyperlipidemia, unspecified: Secondary | ICD-10-CM | POA: Diagnosis not present

## 2020-05-13 DIAGNOSIS — I1310 Hypertensive heart and chronic kidney disease without heart failure, with stage 1 through stage 4 chronic kidney disease, or unspecified chronic kidney disease: Secondary | ICD-10-CM | POA: Diagnosis not present

## 2020-05-13 DIAGNOSIS — Z8679 Personal history of other diseases of the circulatory system: Secondary | ICD-10-CM | POA: Diagnosis not present

## 2020-05-13 DIAGNOSIS — N189 Chronic kidney disease, unspecified: Secondary | ICD-10-CM | POA: Diagnosis not present

## 2020-05-13 DIAGNOSIS — I69318 Other symptoms and signs involving cognitive functions following cerebral infarction: Secondary | ICD-10-CM | POA: Diagnosis not present

## 2020-05-13 DIAGNOSIS — I2510 Atherosclerotic heart disease of native coronary artery without angina pectoris: Secondary | ICD-10-CM | POA: Diagnosis not present

## 2020-05-15 DIAGNOSIS — N189 Chronic kidney disease, unspecified: Secondary | ICD-10-CM | POA: Diagnosis not present

## 2020-05-15 DIAGNOSIS — Z8679 Personal history of other diseases of the circulatory system: Secondary | ICD-10-CM | POA: Diagnosis not present

## 2020-05-15 DIAGNOSIS — E785 Hyperlipidemia, unspecified: Secondary | ICD-10-CM | POA: Diagnosis not present

## 2020-05-15 DIAGNOSIS — I1310 Hypertensive heart and chronic kidney disease without heart failure, with stage 1 through stage 4 chronic kidney disease, or unspecified chronic kidney disease: Secondary | ICD-10-CM | POA: Diagnosis not present

## 2020-05-15 DIAGNOSIS — I69318 Other symptoms and signs involving cognitive functions following cerebral infarction: Secondary | ICD-10-CM | POA: Diagnosis not present

## 2020-05-15 DIAGNOSIS — I2510 Atherosclerotic heart disease of native coronary artery without angina pectoris: Secondary | ICD-10-CM | POA: Diagnosis not present

## 2020-05-16 DIAGNOSIS — Z45018 Encounter for adjustment and management of other part of cardiac pacemaker: Secondary | ICD-10-CM | POA: Diagnosis not present

## 2020-05-16 DIAGNOSIS — I442 Atrioventricular block, complete: Secondary | ICD-10-CM | POA: Diagnosis not present

## 2020-05-16 DIAGNOSIS — Z950 Presence of cardiac pacemaker: Secondary | ICD-10-CM | POA: Diagnosis not present

## 2020-05-19 DIAGNOSIS — E785 Hyperlipidemia, unspecified: Secondary | ICD-10-CM | POA: Diagnosis not present

## 2020-05-19 DIAGNOSIS — Z8679 Personal history of other diseases of the circulatory system: Secondary | ICD-10-CM | POA: Diagnosis not present

## 2020-05-19 DIAGNOSIS — I2510 Atherosclerotic heart disease of native coronary artery without angina pectoris: Secondary | ICD-10-CM | POA: Diagnosis not present

## 2020-05-19 DIAGNOSIS — N189 Chronic kidney disease, unspecified: Secondary | ICD-10-CM | POA: Diagnosis not present

## 2020-05-19 DIAGNOSIS — I69318 Other symptoms and signs involving cognitive functions following cerebral infarction: Secondary | ICD-10-CM | POA: Diagnosis not present

## 2020-05-19 DIAGNOSIS — I1310 Hypertensive heart and chronic kidney disease without heart failure, with stage 1 through stage 4 chronic kidney disease, or unspecified chronic kidney disease: Secondary | ICD-10-CM | POA: Diagnosis not present

## 2020-05-20 DIAGNOSIS — N189 Chronic kidney disease, unspecified: Secondary | ICD-10-CM | POA: Diagnosis not present

## 2020-05-20 DIAGNOSIS — I2510 Atherosclerotic heart disease of native coronary artery without angina pectoris: Secondary | ICD-10-CM | POA: Diagnosis not present

## 2020-05-20 DIAGNOSIS — I1310 Hypertensive heart and chronic kidney disease without heart failure, with stage 1 through stage 4 chronic kidney disease, or unspecified chronic kidney disease: Secondary | ICD-10-CM | POA: Diagnosis not present

## 2020-05-20 DIAGNOSIS — I69318 Other symptoms and signs involving cognitive functions following cerebral infarction: Secondary | ICD-10-CM | POA: Diagnosis not present

## 2020-05-20 DIAGNOSIS — E785 Hyperlipidemia, unspecified: Secondary | ICD-10-CM | POA: Diagnosis not present

## 2020-05-20 DIAGNOSIS — Z8679 Personal history of other diseases of the circulatory system: Secondary | ICD-10-CM | POA: Diagnosis not present

## 2020-05-22 DIAGNOSIS — I69318 Other symptoms and signs involving cognitive functions following cerebral infarction: Secondary | ICD-10-CM | POA: Diagnosis not present

## 2020-05-22 DIAGNOSIS — Z8679 Personal history of other diseases of the circulatory system: Secondary | ICD-10-CM | POA: Diagnosis not present

## 2020-05-22 DIAGNOSIS — I1310 Hypertensive heart and chronic kidney disease without heart failure, with stage 1 through stage 4 chronic kidney disease, or unspecified chronic kidney disease: Secondary | ICD-10-CM | POA: Diagnosis not present

## 2020-05-22 DIAGNOSIS — N189 Chronic kidney disease, unspecified: Secondary | ICD-10-CM | POA: Diagnosis not present

## 2020-05-22 DIAGNOSIS — E785 Hyperlipidemia, unspecified: Secondary | ICD-10-CM | POA: Diagnosis not present

## 2020-05-22 DIAGNOSIS — I2510 Atherosclerotic heart disease of native coronary artery without angina pectoris: Secondary | ICD-10-CM | POA: Diagnosis not present

## 2020-05-26 ENCOUNTER — Other Ambulatory Visit: Payer: Self-pay | Admitting: Family Medicine

## 2020-05-26 DIAGNOSIS — I2510 Atherosclerotic heart disease of native coronary artery without angina pectoris: Secondary | ICD-10-CM | POA: Diagnosis not present

## 2020-05-26 DIAGNOSIS — E785 Hyperlipidemia, unspecified: Secondary | ICD-10-CM | POA: Diagnosis not present

## 2020-05-26 DIAGNOSIS — N189 Chronic kidney disease, unspecified: Secondary | ICD-10-CM | POA: Diagnosis not present

## 2020-05-26 DIAGNOSIS — Z8679 Personal history of other diseases of the circulatory system: Secondary | ICD-10-CM | POA: Diagnosis not present

## 2020-05-26 DIAGNOSIS — I69318 Other symptoms and signs involving cognitive functions following cerebral infarction: Secondary | ICD-10-CM | POA: Diagnosis not present

## 2020-05-26 DIAGNOSIS — I1310 Hypertensive heart and chronic kidney disease without heart failure, with stage 1 through stage 4 chronic kidney disease, or unspecified chronic kidney disease: Secondary | ICD-10-CM | POA: Diagnosis not present

## 2020-05-26 DIAGNOSIS — N3946 Mixed incontinence: Secondary | ICD-10-CM

## 2020-05-27 DIAGNOSIS — I1310 Hypertensive heart and chronic kidney disease without heart failure, with stage 1 through stage 4 chronic kidney disease, or unspecified chronic kidney disease: Secondary | ICD-10-CM | POA: Diagnosis not present

## 2020-05-27 DIAGNOSIS — I2510 Atherosclerotic heart disease of native coronary artery without angina pectoris: Secondary | ICD-10-CM | POA: Diagnosis not present

## 2020-05-27 DIAGNOSIS — N189 Chronic kidney disease, unspecified: Secondary | ICD-10-CM | POA: Diagnosis not present

## 2020-05-27 DIAGNOSIS — E785 Hyperlipidemia, unspecified: Secondary | ICD-10-CM | POA: Diagnosis not present

## 2020-05-27 DIAGNOSIS — Z8679 Personal history of other diseases of the circulatory system: Secondary | ICD-10-CM | POA: Diagnosis not present

## 2020-05-27 DIAGNOSIS — I69318 Other symptoms and signs involving cognitive functions following cerebral infarction: Secondary | ICD-10-CM | POA: Diagnosis not present

## 2020-05-28 DIAGNOSIS — I1310 Hypertensive heart and chronic kidney disease without heart failure, with stage 1 through stage 4 chronic kidney disease, or unspecified chronic kidney disease: Secondary | ICD-10-CM | POA: Diagnosis not present

## 2020-05-28 DIAGNOSIS — I69318 Other symptoms and signs involving cognitive functions following cerebral infarction: Secondary | ICD-10-CM | POA: Diagnosis not present

## 2020-05-28 DIAGNOSIS — N189 Chronic kidney disease, unspecified: Secondary | ICD-10-CM | POA: Diagnosis not present

## 2020-05-28 DIAGNOSIS — E785 Hyperlipidemia, unspecified: Secondary | ICD-10-CM | POA: Diagnosis not present

## 2020-05-28 DIAGNOSIS — Z8679 Personal history of other diseases of the circulatory system: Secondary | ICD-10-CM | POA: Diagnosis not present

## 2020-05-28 DIAGNOSIS — I2510 Atherosclerotic heart disease of native coronary artery without angina pectoris: Secondary | ICD-10-CM | POA: Diagnosis not present

## 2020-05-29 DIAGNOSIS — I1310 Hypertensive heart and chronic kidney disease without heart failure, with stage 1 through stage 4 chronic kidney disease, or unspecified chronic kidney disease: Secondary | ICD-10-CM | POA: Diagnosis not present

## 2020-05-29 DIAGNOSIS — N189 Chronic kidney disease, unspecified: Secondary | ICD-10-CM | POA: Diagnosis not present

## 2020-05-29 DIAGNOSIS — Z8679 Personal history of other diseases of the circulatory system: Secondary | ICD-10-CM | POA: Diagnosis not present

## 2020-05-29 DIAGNOSIS — E785 Hyperlipidemia, unspecified: Secondary | ICD-10-CM | POA: Diagnosis not present

## 2020-05-29 DIAGNOSIS — I69318 Other symptoms and signs involving cognitive functions following cerebral infarction: Secondary | ICD-10-CM | POA: Diagnosis not present

## 2020-05-29 DIAGNOSIS — I2510 Atherosclerotic heart disease of native coronary artery without angina pectoris: Secondary | ICD-10-CM | POA: Diagnosis not present

## 2020-05-30 DIAGNOSIS — I69318 Other symptoms and signs involving cognitive functions following cerebral infarction: Secondary | ICD-10-CM | POA: Diagnosis not present

## 2020-05-30 DIAGNOSIS — Z8679 Personal history of other diseases of the circulatory system: Secondary | ICD-10-CM | POA: Diagnosis not present

## 2020-05-30 DIAGNOSIS — N189 Chronic kidney disease, unspecified: Secondary | ICD-10-CM | POA: Diagnosis not present

## 2020-05-30 DIAGNOSIS — I1310 Hypertensive heart and chronic kidney disease without heart failure, with stage 1 through stage 4 chronic kidney disease, or unspecified chronic kidney disease: Secondary | ICD-10-CM | POA: Diagnosis not present

## 2020-05-30 DIAGNOSIS — E785 Hyperlipidemia, unspecified: Secondary | ICD-10-CM | POA: Diagnosis not present

## 2020-05-30 DIAGNOSIS — I2510 Atherosclerotic heart disease of native coronary artery without angina pectoris: Secondary | ICD-10-CM | POA: Diagnosis not present

## 2020-05-31 DIAGNOSIS — I69318 Other symptoms and signs involving cognitive functions following cerebral infarction: Secondary | ICD-10-CM | POA: Diagnosis not present

## 2020-05-31 DIAGNOSIS — E785 Hyperlipidemia, unspecified: Secondary | ICD-10-CM | POA: Diagnosis not present

## 2020-05-31 DIAGNOSIS — Z8679 Personal history of other diseases of the circulatory system: Secondary | ICD-10-CM | POA: Diagnosis not present

## 2020-05-31 DIAGNOSIS — I1310 Hypertensive heart and chronic kidney disease without heart failure, with stage 1 through stage 4 chronic kidney disease, or unspecified chronic kidney disease: Secondary | ICD-10-CM | POA: Diagnosis not present

## 2020-05-31 DIAGNOSIS — N189 Chronic kidney disease, unspecified: Secondary | ICD-10-CM | POA: Diagnosis not present

## 2020-05-31 DIAGNOSIS — I2510 Atherosclerotic heart disease of native coronary artery without angina pectoris: Secondary | ICD-10-CM | POA: Diagnosis not present

## 2020-06-02 DEATH — deceased

## 2021-01-07 ENCOUNTER — Ambulatory Visit: Payer: Medicare Other | Admitting: Cardiology
